# Patient Record
Sex: Male | Born: 1945 | Race: White | Hispanic: No | Marital: Married | Smoking: Never smoker
Health system: Southern US, Community
[De-identification: ages and names within clinical notes are randomized; demographics above are authoritative.]

## PROBLEM LIST (undated history)

## (undated) DIAGNOSIS — Z87442 Personal history of urinary calculi: Secondary | ICD-10-CM

## (undated) DIAGNOSIS — L03039 Cellulitis of unspecified toe: Secondary | ICD-10-CM

## (undated) DIAGNOSIS — I519 Heart disease, unspecified: Secondary | ICD-10-CM

## (undated) DIAGNOSIS — I10 Essential (primary) hypertension: Secondary | ICD-10-CM

## (undated) DIAGNOSIS — E119 Type 2 diabetes mellitus without complications: Secondary | ICD-10-CM

## (undated) DIAGNOSIS — L02619 Cutaneous abscess of unspecified foot: Secondary | ICD-10-CM

## (undated) HISTORY — PX: CORONARY ARTERY BYPASS GRAFT: SHX141

---

## 2011-11-01 DIAGNOSIS — E782 Mixed hyperlipidemia: Secondary | ICD-10-CM | POA: Diagnosis not present

## 2011-11-01 DIAGNOSIS — E669 Obesity, unspecified: Secondary | ICD-10-CM | POA: Diagnosis not present

## 2011-11-01 DIAGNOSIS — N400 Enlarged prostate without lower urinary tract symptoms: Secondary | ICD-10-CM | POA: Diagnosis not present

## 2011-11-01 DIAGNOSIS — E785 Hyperlipidemia, unspecified: Secondary | ICD-10-CM | POA: Diagnosis not present

## 2011-11-01 DIAGNOSIS — IMO0001 Reserved for inherently not codable concepts without codable children: Secondary | ICD-10-CM | POA: Diagnosis not present

## 2011-11-08 DIAGNOSIS — E782 Mixed hyperlipidemia: Secondary | ICD-10-CM | POA: Diagnosis not present

## 2011-11-08 DIAGNOSIS — R609 Edema, unspecified: Secondary | ICD-10-CM | POA: Diagnosis not present

## 2011-11-08 DIAGNOSIS — E669 Obesity, unspecified: Secondary | ICD-10-CM | POA: Diagnosis not present

## 2011-11-08 DIAGNOSIS — G4730 Sleep apnea, unspecified: Secondary | ICD-10-CM | POA: Diagnosis not present

## 2012-06-13 DIAGNOSIS — E669 Obesity, unspecified: Secondary | ICD-10-CM | POA: Diagnosis not present

## 2012-06-13 DIAGNOSIS — E782 Mixed hyperlipidemia: Secondary | ICD-10-CM | POA: Diagnosis not present

## 2012-06-20 DIAGNOSIS — G4730 Sleep apnea, unspecified: Secondary | ICD-10-CM | POA: Diagnosis not present

## 2012-06-20 DIAGNOSIS — E782 Mixed hyperlipidemia: Secondary | ICD-10-CM | POA: Diagnosis not present

## 2012-06-20 DIAGNOSIS — E119 Type 2 diabetes mellitus without complications: Secondary | ICD-10-CM | POA: Diagnosis not present

## 2012-06-20 DIAGNOSIS — E669 Obesity, unspecified: Secondary | ICD-10-CM | POA: Diagnosis not present

## 2012-06-20 DIAGNOSIS — R609 Edema, unspecified: Secondary | ICD-10-CM | POA: Diagnosis not present

## 2012-09-09 DIAGNOSIS — H524 Presbyopia: Secondary | ICD-10-CM | POA: Diagnosis not present

## 2012-09-09 DIAGNOSIS — H2510 Age-related nuclear cataract, unspecified eye: Secondary | ICD-10-CM | POA: Diagnosis not present

## 2012-09-09 DIAGNOSIS — E1139 Type 2 diabetes mellitus with other diabetic ophthalmic complication: Secondary | ICD-10-CM | POA: Diagnosis not present

## 2012-09-09 DIAGNOSIS — E11319 Type 2 diabetes mellitus with unspecified diabetic retinopathy without macular edema: Secondary | ICD-10-CM | POA: Diagnosis not present

## 2012-09-16 DIAGNOSIS — E669 Obesity, unspecified: Secondary | ICD-10-CM | POA: Diagnosis not present

## 2012-09-16 DIAGNOSIS — E7800 Pure hypercholesterolemia, unspecified: Secondary | ICD-10-CM | POA: Diagnosis not present

## 2012-09-20 DIAGNOSIS — Z23 Encounter for immunization: Secondary | ICD-10-CM | POA: Diagnosis not present

## 2013-03-14 DIAGNOSIS — Z23 Encounter for immunization: Secondary | ICD-10-CM | POA: Diagnosis not present

## 2013-03-19 DIAGNOSIS — E782 Mixed hyperlipidemia: Secondary | ICD-10-CM | POA: Diagnosis not present

## 2013-03-19 DIAGNOSIS — E669 Obesity, unspecified: Secondary | ICD-10-CM | POA: Diagnosis not present

## 2013-06-20 DIAGNOSIS — R609 Edema, unspecified: Secondary | ICD-10-CM | POA: Diagnosis not present

## 2013-09-22 DIAGNOSIS — E669 Obesity, unspecified: Secondary | ICD-10-CM | POA: Diagnosis not present

## 2013-09-22 DIAGNOSIS — E782 Mixed hyperlipidemia: Secondary | ICD-10-CM | POA: Diagnosis not present

## 2013-09-26 DIAGNOSIS — R609 Edema, unspecified: Secondary | ICD-10-CM | POA: Diagnosis not present

## 2014-05-05 DIAGNOSIS — E782 Mixed hyperlipidemia: Secondary | ICD-10-CM | POA: Diagnosis not present

## 2014-05-05 DIAGNOSIS — N320 Bladder-neck obstruction: Secondary | ICD-10-CM | POA: Diagnosis not present

## 2014-05-05 DIAGNOSIS — R609 Edema, unspecified: Secondary | ICD-10-CM | POA: Diagnosis not present

## 2014-05-05 DIAGNOSIS — E669 Obesity, unspecified: Secondary | ICD-10-CM | POA: Diagnosis not present

## 2014-05-05 DIAGNOSIS — IMO0001 Reserved for inherently not codable concepts without codable children: Secondary | ICD-10-CM | POA: Diagnosis not present

## 2014-05-12 DIAGNOSIS — IMO0001 Reserved for inherently not codable concepts without codable children: Secondary | ICD-10-CM | POA: Diagnosis not present

## 2014-11-02 DIAGNOSIS — E669 Obesity, unspecified: Secondary | ICD-10-CM | POA: Diagnosis not present

## 2014-11-02 DIAGNOSIS — E782 Mixed hyperlipidemia: Secondary | ICD-10-CM | POA: Diagnosis not present

## 2014-11-02 DIAGNOSIS — E1165 Type 2 diabetes mellitus with hyperglycemia: Secondary | ICD-10-CM | POA: Diagnosis not present

## 2014-11-02 DIAGNOSIS — D519 Vitamin B12 deficiency anemia, unspecified: Secondary | ICD-10-CM | POA: Diagnosis not present

## 2014-11-09 DIAGNOSIS — Z23 Encounter for immunization: Secondary | ICD-10-CM | POA: Diagnosis not present

## 2014-11-09 DIAGNOSIS — E1165 Type 2 diabetes mellitus with hyperglycemia: Secondary | ICD-10-CM | POA: Diagnosis not present

## 2014-11-09 DIAGNOSIS — E782 Mixed hyperlipidemia: Secondary | ICD-10-CM | POA: Diagnosis not present

## 2014-11-09 DIAGNOSIS — E669 Obesity, unspecified: Secondary | ICD-10-CM | POA: Diagnosis not present

## 2015-04-23 DIAGNOSIS — E782 Mixed hyperlipidemia: Secondary | ICD-10-CM | POA: Diagnosis not present

## 2015-04-23 DIAGNOSIS — E669 Obesity, unspecified: Secondary | ICD-10-CM | POA: Diagnosis not present

## 2015-04-23 DIAGNOSIS — E1165 Type 2 diabetes mellitus with hyperglycemia: Secondary | ICD-10-CM | POA: Diagnosis not present

## 2015-05-05 DIAGNOSIS — E669 Obesity, unspecified: Secondary | ICD-10-CM | POA: Diagnosis not present

## 2015-05-05 DIAGNOSIS — E782 Mixed hyperlipidemia: Secondary | ICD-10-CM | POA: Diagnosis not present

## 2015-05-05 DIAGNOSIS — E1165 Type 2 diabetes mellitus with hyperglycemia: Secondary | ICD-10-CM | POA: Diagnosis not present

## 2015-08-05 DIAGNOSIS — Z23 Encounter for immunization: Secondary | ICD-10-CM | POA: Diagnosis not present

## 2015-09-15 DIAGNOSIS — B351 Tinea unguium: Secondary | ICD-10-CM | POA: Diagnosis not present

## 2015-09-15 DIAGNOSIS — M79671 Pain in right foot: Secondary | ICD-10-CM | POA: Diagnosis not present

## 2015-12-29 DIAGNOSIS — L02619 Cutaneous abscess of unspecified foot: Secondary | ICD-10-CM

## 2015-12-29 DIAGNOSIS — L03039 Cellulitis of unspecified toe: Secondary | ICD-10-CM

## 2015-12-29 HISTORY — DX: Cutaneous abscess of unspecified foot: L02.619

## 2015-12-29 HISTORY — DX: Cutaneous abscess of unspecified foot: L03.039

## 2016-01-29 ENCOUNTER — Inpatient Hospital Stay (HOSPITAL_COMMUNITY)
Admission: EM | Admit: 2016-01-29 | Discharge: 2016-02-03 | DRG: 239 | Disposition: A | Discharge status: Home Health | Payer: Medicare Other | Attending: Internal Medicine | Admitting: Internal Medicine

## 2016-01-29 ENCOUNTER — Inpatient Hospital Stay (HOSPITAL_COMMUNITY): Payer: Medicare Other

## 2016-01-29 ENCOUNTER — Encounter (HOSPITAL_COMMUNITY): Payer: Self-pay | Admitting: Nurse Practitioner

## 2016-01-29 DIAGNOSIS — E119 Type 2 diabetes mellitus without complications: Secondary | ICD-10-CM | POA: Diagnosis not present

## 2016-01-29 DIAGNOSIS — M726 Necrotizing fasciitis: Secondary | ICD-10-CM | POA: Diagnosis present

## 2016-01-29 DIAGNOSIS — E785 Hyperlipidemia, unspecified: Secondary | ICD-10-CM | POA: Diagnosis present

## 2016-01-29 DIAGNOSIS — M7989 Other specified soft tissue disorders: Secondary | ICD-10-CM | POA: Diagnosis present

## 2016-01-29 DIAGNOSIS — M86171 Other acute osteomyelitis, right ankle and foot: Secondary | ICD-10-CM | POA: Diagnosis not present

## 2016-01-29 DIAGNOSIS — K219 Gastro-esophageal reflux disease without esophagitis: Secondary | ICD-10-CM | POA: Diagnosis present

## 2016-01-29 DIAGNOSIS — M869 Osteomyelitis, unspecified: Secondary | ICD-10-CM | POA: Diagnosis present

## 2016-01-29 DIAGNOSIS — I10 Essential (primary) hypertension: Secondary | ICD-10-CM | POA: Diagnosis present

## 2016-01-29 DIAGNOSIS — D509 Iron deficiency anemia, unspecified: Secondary | ICD-10-CM | POA: Diagnosis present

## 2016-01-29 DIAGNOSIS — G8918 Other acute postprocedural pain: Secondary | ICD-10-CM | POA: Diagnosis not present

## 2016-01-29 DIAGNOSIS — Z7984 Long term (current) use of oral hypoglycemic drugs: Secondary | ICD-10-CM | POA: Diagnosis not present

## 2016-01-29 DIAGNOSIS — Z7982 Long term (current) use of aspirin: Secondary | ICD-10-CM | POA: Diagnosis not present

## 2016-01-29 DIAGNOSIS — E1165 Type 2 diabetes mellitus with hyperglycemia: Secondary | ICD-10-CM | POA: Diagnosis present

## 2016-01-29 DIAGNOSIS — Z951 Presence of aortocoronary bypass graft: Secondary | ICD-10-CM

## 2016-01-29 DIAGNOSIS — E1169 Type 2 diabetes mellitus with other specified complication: Secondary | ICD-10-CM | POA: Diagnosis present

## 2016-01-29 DIAGNOSIS — I2510 Atherosclerotic heart disease of native coronary artery without angina pectoris: Secondary | ICD-10-CM | POA: Diagnosis present

## 2016-01-29 DIAGNOSIS — E1152 Type 2 diabetes mellitus with diabetic peripheral angiopathy with gangrene: Secondary | ICD-10-CM | POA: Diagnosis not present

## 2016-01-29 DIAGNOSIS — K5900 Constipation, unspecified: Secondary | ICD-10-CM | POA: Diagnosis present

## 2016-01-29 DIAGNOSIS — I96 Gangrene, not elsewhere classified: Secondary | ICD-10-CM | POA: Diagnosis not present

## 2016-01-29 DIAGNOSIS — Z792 Long term (current) use of antibiotics: Secondary | ICD-10-CM

## 2016-01-29 DIAGNOSIS — L089 Local infection of the skin and subcutaneous tissue, unspecified: Secondary | ICD-10-CM | POA: Diagnosis not present

## 2016-01-29 DIAGNOSIS — L03119 Cellulitis of unspecified part of limb: Secondary | ICD-10-CM | POA: Diagnosis not present

## 2016-01-29 DIAGNOSIS — L02619 Cutaneous abscess of unspecified foot: Secondary | ICD-10-CM | POA: Diagnosis present

## 2016-01-29 DIAGNOSIS — M6701 Short Achilles tendon (acquired), right ankle: Secondary | ICD-10-CM | POA: Diagnosis not present

## 2016-01-29 HISTORY — DX: Heart disease, unspecified: I51.9

## 2016-01-29 HISTORY — DX: Cellulitis of unspecified toe: L03.039

## 2016-01-29 HISTORY — DX: Personal history of urinary calculi: Z87.442

## 2016-01-29 HISTORY — DX: Type 2 diabetes mellitus without complications: E11.9

## 2016-01-29 HISTORY — DX: Essential (primary) hypertension: I10

## 2016-01-29 HISTORY — DX: Cutaneous abscess of unspecified foot: L02.619

## 2016-01-29 LAB — COMPREHENSIVE METABOLIC PANEL WITH GFR
ALT: 31 U/L (ref 17–63)
AST: 31 U/L (ref 15–41)
Albumin: 2.7 g/dL — ABNORMAL LOW (ref 3.5–5.0)
Alkaline Phosphatase: 83 U/L (ref 38–126)
Anion gap: 13 (ref 5–15)
BUN: 19 mg/dL (ref 6–20)
CO2: 21 mmol/L — ABNORMAL LOW (ref 22–32)
Calcium: 9.6 mg/dL (ref 8.9–10.3)
Chloride: 99 mmol/L — ABNORMAL LOW (ref 101–111)
Creatinine, Ser: 1.1 mg/dL (ref 0.61–1.24)
GFR calc Af Amer: >60 mL/min (ref >60)
GFR calc non Af Amer: >60 mL/min (ref >60)
Glucose, Bld: 364 mg/dL — ABNORMAL HIGH (ref 65–99)
Potassium: 4.2 mmol/L (ref 3.5–5.1)
Sodium: 133 mmol/L — ABNORMAL LOW (ref 135–145)
Total Bilirubin: 0.4 mg/dL (ref 0.3–1.2)
Total Protein: 7 g/dL (ref 6.5–8.1)

## 2016-01-29 LAB — CBC WITH DIFFERENTIAL/PLATELET
BASOS PCT: 0 %
Basophils Absolute: 0 10*3/uL (ref 0.0–0.1)
Eosinophils Absolute: 0.2 10*3/uL (ref 0.0–0.7)
Eosinophils Relative: 2 %
HCT: 29.3 % — ABNORMAL LOW (ref 39.0–52.0)
HEMOGLOBIN: 9.2 g/dL — AB (ref 13.0–17.0)
Lymphocytes Relative: 12 %
Lymphs Abs: 1.3 10*3/uL (ref 0.7–4.0)
MCH: 22.8 pg — ABNORMAL LOW (ref 26.0–34.0)
MCHC: 31.4 g/dL (ref 30.0–36.0)
MCV: 72.7 fL — ABNORMAL LOW (ref 78.0–100.0)
MONOS PCT: 8 %
Monocytes Absolute: 0.9 10*3/uL (ref 0.1–1.0)
NEUTROS ABS: 8.7 10*3/uL — AB (ref 1.7–7.7)
NEUTROS PCT: 78 %
Platelets: 454 10*3/uL — ABNORMAL HIGH (ref 150–400)
RBC: 4.03 MIL/uL — ABNORMAL LOW (ref 4.22–5.81)
RDW: 17.1 % — ABNORMAL HIGH (ref 11.5–15.5)
WBC: 11.1 10*3/uL — ABNORMAL HIGH (ref 4.0–10.5)

## 2016-01-29 LAB — I-STAT CG4 LACTIC ACID, ED
LACTIC ACID, VENOUS: 1.2 mmol/L (ref 0.5–2.0)
Lactic Acid, Venous: 1.55 mmol/L (ref 0.5–2.0)

## 2016-01-29 LAB — CBG MONITORING, ED: Glucose-Capillary: 370 mg/dL — ABNORMAL HIGH (ref 65–99)

## 2016-01-29 MED ORDER — SODIUM CHLORIDE 0.9 % IV BOLUS (SEPSIS)
1000.0000 mL | Freq: Once | INTRAVENOUS | Status: AC
  Administered 2016-01-29: 1000 mL via INTRAVENOUS

## 2016-01-29 MED ORDER — VANCOMYCIN HCL IN DEXTROSE 1-5 GM/200ML-% IV SOLN: 1000.0000 mg | Freq: Once | INTRAVENOUS | Status: DC

## 2016-01-29 MED ORDER — VANCOMYCIN HCL 10 G IV SOLR
1250.0000 mg | Freq: Two times a day (BID) | INTRAVENOUS | Status: DC
  Administered 2016-01-30 – 2016-02-01 (×6): 1250 mg via INTRAVENOUS
  Filled 2016-01-29 (×7): qty 1250

## 2016-01-29 MED ORDER — VANCOMYCIN HCL 10 G IV SOLR
2000.0000 mg | Freq: Once | INTRAVENOUS | Status: AC
  Administered 2016-01-29: 2000 mg via INTRAVENOUS
  Filled 2016-01-29: qty 2000

## 2016-01-29 MED ORDER — PIPERACILLIN-TAZOBACTAM 3.375 G IVPB
3.3750 g | Freq: Three times a day (TID) | INTRAVENOUS | Status: DC
  Administered 2016-01-30 – 2016-02-02 (×9): 3.375 g via INTRAVENOUS
  Filled 2016-01-29 (×11): qty 50

## 2016-01-29 MED ORDER — PIPERACILLIN-TAZOBACTAM 3.375 G IVPB 30 MIN
3.3750 g | Freq: Once | INTRAVENOUS | Status: AC
  Administered 2016-01-29: 3.375 g via INTRAVENOUS
  Filled 2016-01-29: qty 50

## 2016-01-29 MED ORDER — INSULIN ASPART 100 UNIT/ML ~~LOC~~ SOLN
8.0000 [IU] | Freq: Once | SUBCUTANEOUS | Status: AC
  Administered 2016-01-29: 8 [IU] via SUBCUTANEOUS
  Filled 2016-01-29: qty 1

## 2016-01-29 NOTE — H&P (Signed)
[facility] Admission History and Physical       [name] AVW:[id number] DOB: 1945/11/03 DOA: 01/29/2016  Referring physician: EDP PCP: Theadora RamaHIGH,KEVIN P., MD  Specialists:   Chief Complaint:  Worsening Wound  HPI: Todd LootsFoye Celestin is a 70 y.o. male with a history of DM2, HTN who presents to the ED with complaints of worsening pain and redness and swelling of his Right Foot with blackening of his Right great toe over the past 3 weeks despite 2 different antibiotics, Keflex then Clindamycin.    He reports that 3 weeks ago he hit his toe on the corner of his bed and this pulled the toenail off.   The wound continued to worsen.   He was on a cruise to [place] for the past 5 days and he was taken off the ship because he needed further medical evaluation.   In the ED, he was evlautated and placed on IV Vancomycin and Zosyn, and Orthopedics was consulted.   He denies any fevers or chills.      Review of Systems:  Constitutional: No Weight Loss, No Weight Gain, Night Sweats, Fevers, Chills, Dizziness, Light Headedness, Fatigue, or Generalized Weakness HEENT: No Headaches, Difficulty Swallowing,Tooth/Dental Problems,Sore Throat,  No Sneezing, Rhinitis, Ear Ache, Nasal Congestion, or Post Nasal Drip,  Cardio-vascular:  No Chest pain, Orthopnea, PND, Edema in Lower Extremities, Anasarca, Dizziness, Palpitations  Resp: No Dyspnea, No DOE, No Productive Cough, No Non-Productive Cough, No Hemoptysis, No Wheezing.    GI: No Heartburn, Indigestion, Abdominal Pain, Nausea, Vomiting, Diarrhea, Constipation, Hematemesis, Hematochezia, Melena, Change in Bowel Habits,  Loss of Appetite  GU: No Dysuria, No Change in Color of Urine, No Urgency or Urinary Frequency, No Flank pain.  Musculoskeletal: No Joint Pain or Swelling, No Decreased Range of Motion, No Back Pain.  Neurologic: No Syncope, No Seizures, Muscle Weakness, Paresthesia, Vision Disturbance or Loss, No Diplopia, No Vertigo, No Difficulty  Walking,  Skin: + Wound No Rash or Lesions. Psych: No Change in Mood or Affect, No Depression or Anxiety, No Memory loss, No Confusion, or Hallucinations   Past Medical History  Diagnosis Date  . Diabetes mellitus without complication (HCC)   . Heart problem   . Hypertension      Past Surgical History  Procedure Laterality Date  . Coronary artery bypass graft        Prior to Admission medications   Medication Sig Start Date End Date Taking? Authorizing Provider  aspirin EC 81 MG tablet Take 81 mg by mouth daily.   Yes Historical Provider, MD  clindamycin (CLEOCIN) 300 MG capsule Take 300 mg by mouth 4 (four) times daily.   Yes Historical Provider, MD  diphenhydrAMINE (BENADRYL) 25 MG tablet Take 25 mg by mouth every 6 (six) hours as needed for itching.   Yes Historical Provider, MD  furosemide (LASIX) 40 MG tablet Take 40 mg by mouth daily.   Yes Historical Provider, MD  glipiZIDE (GLUCOTROL XL) 10 MG 24 hr tablet Take 10 mg by mouth 2 (two) times daily.   Yes Historical Provider, MD  lisinopril (PRINIVIL,ZESTRIL) 10 MG tablet Take 10 mg by mouth daily.   Yes Historical Provider, MD  loratadine (CLARITIN) 10 MG tablet Take 10 mg by mouth daily.   Yes Historical Provider, MD  metFORMIN (GLUCOPHAGE) 1000 MG tablet Take 1,000 mg by mouth 2 (two) times daily with a meal.   Yes Historical Provider, MD  metoprolol (LOPRESSOR) 50 MG tablet Take 50 mg by mouth 3 (three) times daily.  Yes Historical Provider, MD  Multiple Vitamin (MULTIVITAMIN WITH MINERALS) TABS tablet Take 1 tablet by mouth daily.   Yes Historical Provider, MD  naproxen sodium (ANAPROX) 220 MG tablet Take 220 mg by mouth 2 (two) times daily as needed (for pain).   Yes Historical Provider, MD  omeprazole (PRILOSEC OTC) 20 MG tablet Take 20 mg by mouth daily.   Yes Historical Provider, MD  Phenylephrine-DM-GG-APAP (TYLENOL COLD MULTI-SYMPTOM) 5-10-200-325 MG TABS Take 2 tablets by mouth daily as needed (for cold).   Yes  Historical Provider, MD  simvastatin (ZOCOR) 80 MG tablet Take 80 mg by mouth daily at 6 PM.   Yes Historical Provider, MD  cephALEXin (KEFLEX) 500 MG capsule Take 500 mg by mouth 3 (three) times daily. Reported on 01/29/2016 01/22/16   Historical Provider, MD     No Known Allergies     Social History:  reports that he has never smoked. He does not have any smokeless tobacco history on file. He reports that he drinks alcohol. He reports that he does not use illicit drugs.     History reviewed. No pertinent family history.     Physical Exam:  GEN:  Pleasant Obese Elderly 70 y.o. Caucasian male examined and in no acute distress; cooperative with exam Filed Vitals:   01/29/16 1810 01/29/16 2000  BP: 142/87   Pulse: 110   Temp: 99.3 F (37.4 C)   TempSrc: Oral   Resp: 17   Weight:  97.523 kg (215 lb)  SpO2: 98%    Blood pressure 142/87, pulse 110, temperature 99.3 F (37.4 C), temperature source Oral, resp. rate 17, weight 97.523 kg (215 lb), SpO2 98 %. PSYCH: He is alert and oriented x4; does not appear anxious does not appear depressed; affect is normal HEENT: Normocephalic and Atraumatic, Mucous membranes pink; PERRLA; EOM intact; Fundi:  Benign;  No scleral icterus, Nares: Patent, Oropharynx: Clear, Fair Dentition,    Neck:  FROM, No Cervical Lymphadenopathy nor Thyromegaly or Carotid Bruit; No JVD; Breasts:: Not examined CHEST WALL: No tenderness CHEST: Normal respiration, clear to auscultation bilaterally HEART: Regular rate and rhythm; no murmurs rubs or gallops BACK: No kyphosis or scoliosis; No CVA tenderness ABDOMEN: Positive Bowel Sounds,  Obese, Soft Non-Tender, No Rebound or Guarding; No Masses, No Organomegaly. Rectal Exam: Not done EXTREMITIES: + Necrotic Right Great Toe, +Erythema and Edema of Right Foot; Otherwise No Cyanosis, Clubbing, or Edema; No Ulcerations. Genitalia: not examined PULSES: 2+ and symmetric SKIN: Normal hydration no rash or ulceration CNS:   Alert and Oriented x 4, No Focal Deficits Vascular: pulses palpable throughout    Labs on Admission:  Basic Metabolic Panel:  Recent Labs Lab 01/29/16 1821  NA 133*  K 4.2  CL 99*  CO2 21*  GLUCOSE 364*  BUN 19  CREATININE 1.10  CALCIUM 9.6   Liver Function Tests:  Recent Labs Lab 01/29/16 1821  AST 31  ALT 31  ALKPHOS 83  BILITOT 0.4  PROT 7.0  ALBUMIN 2.7*   No results for input(s): LIPASE, AMYLASE in the last 168 hours. No results for input(s): AMMONIA in the last 168 hours. CBC:  Recent Labs Lab 01/29/16 1821  WBC 11.1*  NEUTROABS 8.7*  HGB 9.2*  HCT 29.3*  MCV 72.7*  PLT 454*   Cardiac Enzymes: No results for input(s): CKTOTAL, CKMB, CKMBINDEX, TROPONINI in the last 168 hours.  BNP (last 3 results) No results for input(s): BNP in the last 8760 hours.  ProBNP (last 3 results) No  results for input(s): PROBNP in the last 8760 hours.  CBG:  Recent Labs Lab 01/29/16 1818  GLUCAP 370*    Radiological Exams on Admission: No results found.   EKG: Independently reviewed.     Assessment/Plan:      70 y.o. male with  Active Problems:    Cellulitis and abscess of foot/Osteomyelitis (HCC)/ Foot infection    IV Vancomycin and IV Zosyn          Diabetes mellitus without complication (HCC)    SSI coverage PRN    Check HbA1C        Hypertension    Monitor BPs    Consider Lasix, Metoprolol          Anemia    Anemia Panel    DVT Prophylaxis    SCDs      Code Status:     FULL CODE        Family Communication:   No Family Present    Disposition Plan:    Inpatient Status        Time spent:  9 Minutes      Ron Parker [facility] Pager [phone]   If 7AM -7PM Please Contact the Day Rounding Team MD for [facility]  If 7PM-7AM, Please Contact Night-Floor Coverage  [email or web address] Password [email or web address] 01/29/2016, 10:35 PM     ADDENDUM:   Patient was seen and examined on 01/29/2016

## 2016-01-29 NOTE — ED Notes (Signed)
Attempted report x1. 

## 2016-01-29 NOTE — ED Provider Notes (Signed)
CSN: [id number]     Arrival date & time 01/29/16  1801 History   First MD Initiated Contact with Patient 01/29/16 1911     Chief Complaint  Patient presents with  . Nail Problem   HPI   70 year old male presents today with infection of his right great toe and foot. Patient reports that 3 weeks ago he stubbed his right great toe causing bruising and him to lose his nail. He notes that he went and saw primary care who placed him on Keflex which seemed to all the very minimal redness E had on his great toe. He notes that he went on a cruise ship, with increased redness and swelling to the toe, was seen on March 25 started on clindamycin 300 mg 4 times daily. Patient reports that he took the clindamycin yesterday as directed, and all 4 ptosis today. Patient notes the redness, swelling has continued to progress with blackening of his toe spreading redness to the distal aspect of the foot. Patient is a diabetic, was attempting to manage his blood sugar on metformin alone, he has refused insulin therapy prior to evaluation. Patient denies any fever, chills, nausea, vomiting.  Past Medical History  Diagnosis Date  . Diabetes mellitus without complication (HCC)   . Heart problem   . Hypertension    Past Surgical History  Procedure Laterality Date  . Coronary artery bypass graft     History reviewed. No pertinent family history. Social History  Substance Use Topics  . Smoking status: Never Smoker   . Smokeless tobacco: None  . Alcohol Use: Yes    Review of Systems  All other systems reviewed and are negative.   Allergies  Review of patient's allergies indicates no known allergies.  Home Medications   Prior to Admission medications   Medication Sig Start Date End Date Taking? Authorizing Provider  aspirin EC 81 MG tablet Take 81 mg by mouth daily.   Yes Historical Provider, MD  clindamycin (CLEOCIN) 300 MG capsule Take 300 mg by mouth 4 (four) times daily.   Yes Historical Provider, MD    diphenhydrAMINE (BENADRYL) 25 MG tablet Take 25 mg by mouth every 6 (six) hours as needed for itching.   Yes Historical Provider, MD  furosemide (LASIX) 40 MG tablet Take 40 mg by mouth daily.   Yes Historical Provider, MD  glipiZIDE (GLUCOTROL XL) 10 MG 24 hr tablet Take 10 mg by mouth 2 (two) times daily.   Yes Historical Provider, MD  lisinopril (PRINIVIL,ZESTRIL) 10 MG tablet Take 10 mg by mouth daily.   Yes Historical Provider, MD  loratadine (CLARITIN) 10 MG tablet Take 10 mg by mouth daily.   Yes Historical Provider, MD  metFORMIN (GLUCOPHAGE) 1000 MG tablet Take 1,000 mg by mouth 2 (two) times daily with a meal.   Yes Historical Provider, MD  metoprolol (LOPRESSOR) 50 MG tablet Take 50 mg by mouth 3 (three) times daily.   Yes Historical Provider, MD  Multiple Vitamin (MULTIVITAMIN WITH MINERALS) TABS tablet Take 1 tablet by mouth daily.   Yes Historical Provider, MD  naproxen sodium (ANAPROX) 220 MG tablet Take 220 mg by mouth 2 (two) times daily as needed (for pain).   Yes Historical Provider, MD  omeprazole (PRILOSEC OTC) 20 MG tablet Take 20 mg by mouth daily.   Yes Historical Provider, MD  Phenylephrine-DM-GG-APAP (TYLENOL COLD MULTI-SYMPTOM) 5-10-200-325 MG TABS Take 2 tablets by mouth daily as needed (for cold).   Yes Historical Provider, MD  simvastatin (ZOCOR)  80 MG tablet Take 80 mg by mouth daily at 6 PM.   Yes Historical Provider, MD  cephALEXin (KEFLEX) 500 MG capsule Take 500 mg by mouth 3 (three) times daily. Reported on 01/29/2016 01/22/16   Historical Provider, MD   BP 142/87 mmHg  Pulse 110  Temp(Src) 99.3 F (37.4 C) (Oral)  Resp 17  Wt 97.523 kg  SpO2 98%    Physical Exam  Constitutional: He is oriented to person, place, and time. He appears well-developed and well-nourished.  HENT:  Head: Normocephalic and atraumatic.  Eyes: Conjunctivae are normal. Pupils are equal, round, and reactive to light. Right eye exhibits no discharge. Left eye exhibits no discharge. No  scleral icterus.  Neck: Normal range of motion. No JVD present. No tracheal deviation present.  Pulmonary/Chest: Effort normal. No stridor.  Neurological: He is alert and oriented to person, place, and time. Coordination normal.  Psychiatric: He has a normal mood and affect. His behavior is normal. Judgment and thought content normal.  Nursing note and vitals reviewed.  Pedal pulses intact bilateral      ED Course  Procedures (including critical care time) Labs Review Labs Reviewed  COMPREHENSIVE METABOLIC PANEL - Abnormal; Notable for the following:    Sodium 133 (*)    Chloride 99 (*)    CO2 21 (*)    Glucose, Bld 364 (*)    Albumin 2.7 (*)    All other components within normal limits  CBC WITH DIFFERENTIAL/PLATELET - Abnormal; Notable for the following:    WBC 11.1 (*)    RBC 4.03 (*)    Hemoglobin 9.2 (*)    HCT 29.3 (*)    MCV 72.7 (*)    MCH 22.8 (*)    RDW 17.1 (*)    Platelets 454 (*)    Neutro Abs 8.7 (*)    All other components within normal limits  CBG MONITORING, ED - Abnormal; Notable for the following:    Glucose-Capillary 370 (*)    All other components within normal limits  CULTURE, BLOOD (ROUTINE X 2)  CULTURE, BLOOD (ROUTINE X 2)  I-STAT CG4 LACTIC ACID, ED  I-STAT CG4 LACTIC ACID, ED    Imaging Review No results found. I have personally reviewed and evaluated these images and lab results as part of my medical decision-making.   EKG Interpretation None      MDM   Final diagnoses:  Foot infection    Labs:  I-STAT lactic acid, CMP, CBC, point of care CBG- WBC 11.1  Imaging:   Consults: Hospitalist service  Therapeutics:  Discharge Meds:   Assessment/Plan: 70 year old male presents today with infection to his toe and foot. Patient has significant necrotic tissue with deep space infection. Patient has a uncontrolled diabetic. Patient has had Keflex, currently taking clindamycin. Patient is afebrile nontoxic, he does have a small  amount of tachycardia, with no significant elevation and lactic acid. Patient will be placed on antibiotics, admitted to hospital service. Dr. Charlotte Sanes personally spoke with hospitalist service and orthopedics regarding this patient.        Eyvonne Mechanic, PA-C 01/30/16 0154  Courteney Randall An, MD 01/30/16 1558

## 2016-01-29 NOTE — ED Notes (Addendum)
Pt reports over past few days his R great toe has "become black" and his foot has been hurting. He has been on 2 different oral antibiotics over past days for infection of this toe after he injured the toe several weeks ago and the toenail came off. His right foot is red and swollen with dry scaly skin and darkened discoloration of R great toe. He also reports his blood sugar was over 500 when he checked it this morning.

## 2016-01-29 NOTE — ED Notes (Signed)
Patient transported to X-ray 

## 2016-01-29 NOTE — Progress Notes (Signed)
Pharmacy Antibiotic Note  [name] is a 70 y.o. male admitted on 01/29/2016 with cellulitis.  Pharmacy has been consulted for vancomycin and zosyn dosing. Pt is afebrile and WBC is 11.1. SCr is WNL at 1.1 and lactic is 1.55.   Plan: - Vancomycin 2gm IV x 1 then 1250mg  IV Q12H - Zosyn 3.375gm IV Q8H (4 hr inf) - F/u renal fxn, C&S, clinical status and trough at SS  Weight: 215 lb (97.523 kg)  Temp (24hrs), Avg:99.3 F (37.4 C), Min:99.3 F (37.4 C), Max:99.3 F (37.4 C)   Recent Labs Lab 01/29/16 1821 01/29/16 1831  WBC 11.1*  --   CREATININE 1.10  --   LATICACIDVEN  --  1.55    CrCl cannot be calculated (Unknown ideal weight.).    No Known Allergies  Antimicrobials this admission: Vanc 4/1>> Zosyn 4/1>>  Dose adjustments this admission: N/A  Microbiology results: Pending  Thank you for allowing pharmacy to be a part of this patient's care.  Neizan Debruhl, Drake LeachRachel Lynn 01/29/2016 8:58 PM

## 2016-01-30 LAB — IRON AND TIBC
IRON: 13 ug/dL — AB (ref 45–182)
SATURATION RATIOS: 5 % — AB (ref 17.9–39.5)
TIBC: 272 ug/dL (ref 250–450)
UIBC: 259 ug/dL

## 2016-01-30 LAB — GLUCOSE, CAPILLARY
GLUCOSE-CAPILLARY: 234 mg/dL — AB (ref 65–99)
GLUCOSE-CAPILLARY: 314 mg/dL — AB (ref 65–99)
GLUCOSE-CAPILLARY: 322 mg/dL — AB (ref 65–99)
Glucose-Capillary: 174 mg/dL — ABNORMAL HIGH (ref 65–99)
Glucose-Capillary: 307 mg/dL — ABNORMAL HIGH (ref 65–99)

## 2016-01-30 LAB — CBC
HCT: 25.2 % — ABNORMAL LOW (ref 39.0–52.0)
Hemoglobin: 8 g/dL — ABNORMAL LOW (ref 13.0–17.0)
MCH: 23.1 pg — ABNORMAL LOW (ref 26.0–34.0)
MCHC: 31.7 g/dL (ref 30.0–36.0)
MCV: 72.8 fL — ABNORMAL LOW (ref 78.0–100.0)
PLATELETS: 364 10*3/uL (ref 150–400)
RBC: 3.46 MIL/uL — ABNORMAL LOW (ref 4.22–5.81)
RDW: 17.1 % — AB (ref 11.5–15.5)
WBC: 7.6 10*3/uL (ref 4.0–10.5)

## 2016-01-30 LAB — BASIC METABOLIC PANEL
Anion gap: 8 (ref 5–15)
BUN: 15 mg/dL (ref 6–20)
CALCIUM: 8.8 mg/dL — AB (ref 8.9–10.3)
CO2: 24 mmol/L (ref 22–32)
CREATININE: 0.87 mg/dL (ref 0.61–1.24)
Chloride: 106 mmol/L (ref 101–111)
GFR calc Af Amer: >60 mL/min (ref >60)
Glucose, Bld: 205 mg/dL — ABNORMAL HIGH (ref 65–99)
Potassium: 3.8 mmol/L (ref 3.5–5.1)
SODIUM: 138 mmol/L (ref 135–145)

## 2016-01-30 LAB — PROTIME-INR
INR: 1.34 (ref 0.00–1.49)
Prothrombin Time: 16.7 seconds — ABNORMAL HIGH (ref 11.6–15.2)

## 2016-01-30 LAB — RETICULOCYTES
RBC.: 3.83 MIL/uL — ABNORMAL LOW (ref 4.22–5.81)
RETIC CT PCT: 1.6 % (ref 0.4–3.1)
Retic Count, Absolute: 61.3 10*3/uL (ref 19.0–186.0)

## 2016-01-30 LAB — APTT: APTT: 30 s (ref 24–37)

## 2016-01-30 LAB — VITAMIN B12: VITAMIN B 12: 425 pg/mL (ref 180–914)

## 2016-01-30 LAB — LACTIC ACID, PLASMA
LACTIC ACID, VENOUS: 0.6 mmol/L (ref 0.5–2.0)
Lactic Acid, Venous: 1.1 mmol/L (ref 0.5–2.0)

## 2016-01-30 LAB — FOLATE: Folate: 30.1 ng/mL (ref >5.9)

## 2016-01-30 LAB — FERRITIN: FERRITIN: 67 ng/mL (ref 24–336)

## 2016-01-30 LAB — PROCALCITONIN

## 2016-01-30 MED ORDER — FUROSEMIDE 40 MG PO TABS
40.0000 mg | ORAL_TABLET | Freq: Every day | ORAL | Status: DC
  Administered 2016-01-30 – 2016-02-03 (×5): 40 mg via ORAL
  Filled 2016-01-30 (×5): qty 1

## 2016-01-30 MED ORDER — SODIUM CHLORIDE 0.9 % IV SOLN
INTRAVENOUS | Status: DC
  Administered 2016-01-30: 05:00:00 via INTRAVENOUS

## 2016-01-30 MED ORDER — ACETAMINOPHEN 650 MG RE SUPP: 650.0000 mg | Freq: Four times a day (QID) | RECTAL | Status: DC | PRN

## 2016-01-30 MED ORDER — INSULIN ASPART 100 UNIT/ML ~~LOC~~ SOLN
0.0000 [IU] | SUBCUTANEOUS | Status: DC
  Administered 2016-01-30: 2 [IU] via SUBCUTANEOUS
  Administered 2016-01-30: 3 [IU] via SUBCUTANEOUS

## 2016-01-30 MED ORDER — ADULT MULTIVITAMIN W/MINERALS CH
1.0000 | ORAL_TABLET | Freq: Every day | ORAL | Status: DC
  Administered 2016-01-30 – 2016-02-03 (×5): 1 via ORAL
  Filled 2016-01-30 (×5): qty 1

## 2016-01-30 MED ORDER — ATORVASTATIN CALCIUM 40 MG PO TABS
40.0000 mg | ORAL_TABLET | Freq: Every day | ORAL | Status: DC
  Administered 2016-01-30 – 2016-02-02 (×4): 40 mg via ORAL
  Filled 2016-01-30 (×3): qty 1

## 2016-01-30 MED ORDER — INSULIN GLARGINE 100 UNIT/ML ~~LOC~~ SOLN
10.0000 [IU] | Freq: Every day | SUBCUTANEOUS | Status: DC
  Administered 2016-01-30: 10 [IU] via SUBCUTANEOUS
  Filled 2016-01-30 (×2): qty 0.1

## 2016-01-30 MED ORDER — HYDROMORPHONE HCL 1 MG/ML IJ SOLN
0.5000 mg | INTRAMUSCULAR | Status: DC | PRN
  Administered 2016-02-02 – 2016-02-03 (×6): 1 mg via INTRAVENOUS
  Filled 2016-01-30 (×6): qty 1

## 2016-01-30 MED ORDER — METOPROLOL TARTRATE 50 MG PO TABS
50.0000 mg | ORAL_TABLET | Freq: Three times a day (TID) | ORAL | Status: DC
  Administered 2016-01-30 – 2016-02-03 (×13): 50 mg via ORAL
  Filled 2016-01-30 (×13): qty 1

## 2016-01-30 MED ORDER — OXYCODONE HCL 5 MG PO TABS
5.0000 mg | ORAL_TABLET | ORAL | Status: DC | PRN
  Administered 2016-02-02: 5 mg via ORAL
  Filled 2016-01-30: qty 1

## 2016-01-30 MED ORDER — LORATADINE 10 MG PO TABS
10.0000 mg | ORAL_TABLET | Freq: Every day | ORAL | Status: DC
  Administered 2016-01-31 – 2016-02-03 (×4): 10 mg via ORAL
  Filled 2016-01-30 (×5): qty 1

## 2016-01-30 MED ORDER — ACETAMINOPHEN 325 MG PO TABS
650.0000 mg | ORAL_TABLET | Freq: Four times a day (QID) | ORAL | Status: DC | PRN
  Filled 2016-01-30: qty 2

## 2016-01-30 MED ORDER — ONDANSETRON HCL 4 MG PO TABS: 4.0000 mg | ORAL_TABLET | Freq: Four times a day (QID) | ORAL | Status: DC | PRN

## 2016-01-30 MED ORDER — ONDANSETRON HCL 4 MG/2ML IJ SOLN: 4.0000 mg | Freq: Four times a day (QID) | INTRAMUSCULAR | Status: DC | PRN

## 2016-01-30 MED ORDER — INSULIN ASPART 100 UNIT/ML ~~LOC~~ SOLN
0.0000 [IU] | Freq: Every day | SUBCUTANEOUS | Status: DC
Start: 2016-01-30 — End: 2016-02-03
  Administered 2016-01-30: 4 [IU] via SUBCUTANEOUS
  Administered 2016-01-31: 5 [IU] via SUBCUTANEOUS
  Administered 2016-02-01 – 2016-02-02 (×2): 2 [IU] via SUBCUTANEOUS

## 2016-01-30 MED ORDER — INSULIN ASPART 100 UNIT/ML ~~LOC~~ SOLN
0.0000 [IU] | Freq: Three times a day (TID) | SUBCUTANEOUS | Status: DC
  Administered 2016-01-30 – 2016-01-31 (×4): 11 [IU] via SUBCUTANEOUS
  Administered 2016-01-31: 8 [IU] via SUBCUTANEOUS
  Administered 2016-02-01 – 2016-02-02 (×4): 5 [IU] via SUBCUTANEOUS
  Administered 2016-02-02: 3 [IU] via SUBCUTANEOUS
  Administered 2016-02-02 – 2016-02-03 (×3): 5 [IU] via SUBCUTANEOUS

## 2016-01-30 MED ORDER — PANTOPRAZOLE SODIUM 40 MG PO TBEC
40.0000 mg | DELAYED_RELEASE_TABLET | Freq: Every day | ORAL | Status: DC
  Administered 2016-01-30 – 2016-02-03 (×5): 40 mg via ORAL
  Filled 2016-01-30 (×5): qty 1

## 2016-01-30 MED ORDER — HEPARIN SODIUM (PORCINE) 5000 UNIT/ML IJ SOLN
5000.0000 [IU] | Freq: Three times a day (TID) | INTRAMUSCULAR | Status: DC
  Administered 2016-01-30 – 2016-01-31 (×3): 5000 [IU] via SUBCUTANEOUS
  Filled 2016-01-30 (×3): qty 1

## 2016-01-30 NOTE — Progress Notes (Signed)
Patient seen and evaluated.  R foot gangrene and cellulitis Cont IV abx Dr. Victorino DikeHewitt plans for surgery Tuesday

## 2016-01-30 NOTE — Consult Note (Signed)
Reason for Consult:  Right foot gangrene Referring Physician:  Dr. Murvin Donning is an 70 y.o. male.  HPI:  70 y/o male with PMH of CAD and poorly controlled DM presents to the ER with a cc of right forefoot redness and swelling.  He describes an injury to the right hallux several weeks ago when he kicked a piece of furniture.  He says the toe darkened in color a few days ago while he was on a cruise to [place].  The ship's doctor instructed him to disembark to seek care for the worsening infection of the right hallux.  He denies previous amputations or any injury or surgery to either foot.  He is not a smoker.  He denies recent f/c/n/v/wt loss.  He denies any significant pain in the right forefoot.  Past Medical History  Diagnosis Date  . Diabetes mellitus without complication ([place])   . Heart problem   . Hypertension     Past Surgical History  Procedure Laterality Date  . Coronary artery bypass graft      FH:  Liver disease in father.    Social History:  reports that he has never smoked. He does not have any smokeless tobacco history on file. He reports that he drinks alcohol. He reports that he does not use illicit drugs.  Allergies: No Known Allergies  Medications: I have reviewed the patient's current medications.  On vanc and zosyn.  Results for orders placed or performed during the hospital encounter of 01/29/16 (from the past 48 hour(s))  POC CBG, ED     Status: Abnormal   Collection Time: 01/29/16  6:18 PM  Result Value Ref Range   Glucose-Capillary 370 (H) 65 - 99 mg/dL  Comprehensive metabolic panel     Status: Abnormal   Collection Time: 01/29/16  6:21 PM  Result Value Ref Range   Sodium 133 (L) 135 - 145 mmol/L   Potassium 4.2 3.5 - 5.1 mmol/L   Chloride 99 (L) 101 - 111 mmol/L   CO2 21 (L) 22 - 32 mmol/L   Glucose, Bld 364 (H) 65 - 99 mg/dL   BUN 19 6 - 20 mg/dL   Creatinine, Ser 1.10 0.61 - 1.24 mg/dL   Calcium 9.6 8.9 - 10.3 mg/dL   Total Protein  7.0 6.5 - 8.1 g/dL   Albumin 2.7 (L) 3.5 - 5.0 g/dL   AST 31 15 - 41 U/L   ALT 31 17 - 63 U/L   Alkaline Phosphatase 83 38 - 126 U/L   Total Bilirubin 0.4 0.3 - 1.2 mg/dL   GFR calc non Af Amer >60 >60 mL/min   GFR calc Af Amer >60 >60 mL/min    Comment: (NOTE) The eGFR has been calculated using the CKD EPI equation. This calculation has not been validated in all clinical situations. eGFR's persistently <60 mL/min signify possible Chronic Kidney Disease.    Anion gap 13 5 - 15  CBC with Differential     Status: Abnormal   Collection Time: 01/29/16  6:21 PM  Result Value Ref Range   WBC 11.1 (H) 4.0 - 10.5 K/uL   RBC 4.03 (L) 4.22 - 5.81 MIL/uL   Hemoglobin 9.2 (L) 13.0 - 17.0 g/dL   HCT 29.3 (L) 39.0 - 52.0 %   MCV 72.7 (L) 78.0 - 100.0 fL   MCH 22.8 (L) 26.0 - 34.0 pg   MCHC 31.4 30.0 - 36.0 g/dL   RDW 17.1 (H) 11.5 - 15.5 %  Platelets 454 (H) 150 - 400 K/uL   Neutrophils Relative % 78 %   Neutro Abs 8.7 (H) 1.7 - 7.7 K/uL   Lymphocytes Relative 12 %   Lymphs Abs 1.3 0.7 - 4.0 K/uL   Monocytes Relative 8 %   Monocytes Absolute 0.9 0.1 - 1.0 K/uL   Eosinophils Relative 2 %   Eosinophils Absolute 0.2 0.0 - 0.7 K/uL   Basophils Relative 0 %   Basophils Absolute 0.0 0.0 - 0.1 K/uL  I-Stat CG4 Lactic Acid, ED (Not at [facility])     Status: None   Collection Time: 01/29/16  6:31 PM  Result Value Ref Range   Lactic Acid, Venous 1.55 0.5 - 2.0 mmol/L  I-Stat CG4 Lactic Acid, ED (Not at [facility])     Status: None   Collection Time: 01/29/16 10:14 PM  Result Value Ref Range   Lactic Acid, Venous 1.20 0.5 - 2.0 mmol/L  Glucose, capillary     Status: Abnormal   Collection Time: 01/30/16 12:49 AM  Result Value Ref Range   Glucose-Capillary 234 (H) 65 - 99 mg/dL  Protime-INR     Status: Abnormal   Collection Time: 01/30/16  1:00 AM  Result Value Ref Range   Prothrombin Time 16.7 (H) 11.6 - 15.2 seconds   INR 1.34 0.00 - 1.49  APTT     Status: None   Collection Time: 01/30/16  1:00  AM  Result Value Ref Range   aPTT 30 24 - 37 seconds    Dg Foot Complete Right  01/30/2016  CLINICAL DATA:  Acute onset of right foot infection. Initial encounter.[id number] EXAM: RIGHT FOOT COMPLETE - 3+ VIEW COMPARISON:  None. FINDINGS: There is erosion of much of the first phalanx, with a moth-eaten appearance to the residual first phalanx. Surrounding soft tissue air tracks proximally about the first metatarsophalangeal joint. The appearance is suspicious for infection with a gas producing organism. Necrotizing fasciitis is a concern. No definite additional osseous erosions are characterized, though evaluation for osteomyelitis is limited on radiograph. Diffuse vascular calcifications are seen. Soft tissue swelling is noted about the ankle. Plantar and posterior calcaneal spurs are seen. An os trigonum is noted. Small os naviculare fragments are seen. IMPRESSION: 1. Erosion of much of the first phalanx, with a moth-eaten appearance to the residual first phalanx, compatible with severe osteomyelitis. Surrounding soft tissue air tracks proximally about the first metatarsophalangeal joint. The appearance is suspicious for infection with a gas producing organism. Necrotizing fasciitis is a concern. 2. No definite additional osseous erosions seen, though evaluation for additional osteomyelitis is limited on radiograph. 3. Diffuse vascular calcifications seen. 4. Os trigonum noted.  Small os naviculare fragments seen. These results were called by telephone at the time of interpretation on 01/30/2016 at 12:32 am to Dr. Arnoldo Morale, who verbally acknowledged these results. Electronically Signed   By: Garald Balding M.D.   On: 01/30/2016 00:36   ROS:  As above PE:  Blood pressure 165/81, pulse 104, temperature 99.3 F (37.4 C), temperature source Oral, resp. rate 20, weight 93.759 kg (206 lb 11.2 oz), SpO2 100 %. wn wd elderly male in nad.  A and O x 4.  Mood and affect normal.  EOMI.  resp unlabored.  R hallux with dry  gangrene to the base of the toe.  Skin at the 2nd toe base is compromised.  2+ dp pulse.  Sens to LT diminshed at the forefoot.  Diffuse swelling about the foot to the ankle.  5/5 strength  in PF and DF of the ankle.  No crepitus of the foot with palpation.  No lymphadenopathy.  Assessment/Plan:  Right forefoot gangrene and cellulitis - agree with IV abx.  This will hopefully allow the compromised soft tissues to demarcate within a day or two.  I don't believe there are signs or symptoms of necrotizing fasciitis.  He will require amputation of at least the hallux but more likely the entire forefoot given the extensive involvement of the hallux and 2nd toe.  We'll plan surgery for early in the week.  Wylene Simmer 01/30/2016, 1:39 AM

## 2016-01-30 NOTE — Progress Notes (Signed)
[facility]                                                                              Patient Demographics  [name], is a 70 y.o. male, DOB - 07-17-1946, ZOX:[id number]  Admit date - 01/29/2016   Admitting Physician Ron Parker, MD  Outpatient Primary MD for the patient is Theadora Rama., MD  LOS - 1  days    Chief Complaint  Patient presents with  . Nail Problem       Brief HPI   [name] is a 70 y.o. male with a history of DM2, HTN presented to ED with worsening pain, redness and swelling of his right foot, blackening of his right great toe for the past 3 weeks. Patient was on 2 different antibiotics, Keflex and clindamycin.  He reported that 3 weeks ago, he hit his toe on the corner of his bed and this pulled the toenail off. The wound continued to worsen. He was on a cruise to [place] prior to admission and he was taken off the ship because he needed further medical evaluation. In the ED, he was evaluated and placed on IV Vancomycin and Zosyn, and Orthopedics was consulted.Patient was admitted for further workup.   Assessment & Plan    Active Problems:   Osteomyelitis (HCC) right great toe, cellulitis of the right foot - Right foot x-ray showed occlusion of much of the first phalanx, severe osteomyelitis, soft tissue air tracks about the first MTP joint, necrotizing fasciitis is a concern - Orthopedics was consulted, seen by Dr. Victorino Dike, recommended OR early next week - Continue IV vancomycin and Zosyn, follow closely     Diabetes mellitus, type II uncontrolled - Uncontrolled, place on aggressive CBG control to aid in wound healing - Placed on Lantus 10 units daily, sliding scale insulin, follow hemoglobin A1c    Hypertension - Currently stable, continue metoprolol, home dose of Lasix  GERD Continue PPI  Hyperlipidemia Continue statin  Anemia - Microcytic, MCV 72, obtain anemia panel, FOBT  Code Status: Full CODE  STATUS  Family Communication: Discussed in detail with the patient, all imaging results, lab results explained to the patient and wife at the bedside    Disposition Plan:   Time Spent in minutes 25 minutes  Procedures  Foot x-ray  Consults   Orthopedics  DVT Prophylaxis  placed on heparin subcutaneous  Medications  Scheduled Meds: . atorvastatin  40 mg Oral q1800  . furosemide  40 mg Oral Daily  . insulin aspart  0-9 Units Subcutaneous 6 times per day  . loratadine  10 mg Oral Daily  . metoprolol  50 mg Oral 3 times per day  . multivitamin with minerals  1 tablet Oral Daily  . pantoprazole  40 mg Oral Daily  . piperacillin-tazobactam (ZOSYN)  IV  3.375 g Intravenous Q8H  . vancomycin  1,250 mg Intravenous Q12H   Continuous Infusions: . sodium chloride 75 mL/hr at 01/30/16 0502   PRN Meds:.acetaminophen **OR** acetaminophen, HYDROmorphone (DILAUDID) injection, ondansetron **OR** ondansetron (ZOFRAN) IV, oxyCODONE   Antibiotics   Anti-infectives    Start  Dose/Rate Route Frequency Ordered Stop   01/30/16 1000  vancomycin (VANCOCIN) 1,250 mg in sodium chloride 0.9 % 250 mL IVPB     1,250 mg 166.7 mL/hr over 90 Minutes Intravenous Every 12 hours 01/29/16 2057     01/30/16 0600  piperacillin-tazobactam (ZOSYN) IVPB 3.375 g     3.375 g 12.5 mL/hr over 240 Minutes Intravenous Every 8 hours 01/29/16 2057     01/29/16 2100  vancomycin (VANCOCIN) 2,000 mg in sodium chloride 0.9 % 500 mL IVPB     2,000 mg 250 mL/hr over 120 Minutes Intravenous  Once 01/29/16 2055 01/30/16 0058   01/29/16 2030  piperacillin-tazobactam (ZOSYN) IVPB 3.375 g     3.375 g 100 mL/hr over 30 Minutes Intravenous  Once 01/29/16 2027 01/29/16 2328   01/29/16 2030  vancomycin (VANCOCIN) IVPB 1000 mg/200 mL premix  Status:  Discontinued     1,000 mg 200 mL/hr over 60 Minutes Intravenous  Once 01/29/16 2027 01/29/16 2055        Subjective:   [name] was seen and examined today.  Patient  denies dizziness, chest pain, shortness of breath, abdominal pain, N/V/D/C, new weakness, numbess, tingling. No acute events overnight.    Objective:   Filed Vitals:   01/29/16 2230 01/30/16 0033 01/30/16 0036 01/30/16 0506  BP: 135/94  165/81 121/61  Pulse: 90  104 85  Temp:   99.3 F (37.4 C) 99.2 F (37.3 C)  TempSrc:   Oral Oral  Resp: 16  20 16   Weight:   93.759 kg (206 lb 11.2 oz)   SpO2: 98% 100% 100% 95%    Intake/Output Summary (Last 24 hours) at 01/30/16 1053 Last data filed at 01/30/16 0830  Gross per 24 hour  Intake    240 ml  Output      0 ml  Net    240 ml     Wt Readings from Last 3 Encounters:  01/30/16 93.759 kg (206 lb 11.2 oz)     Exam  General: Alert and oriented x 3, NAD  HEENT:  PERRLA, EOMI, Anicteric Sclera, mucous membranes moist.   Neck: Supple, no JVD, no masses  CVS: S1 S2 auscultated, no rubs, murmurs or gallops. Regular rate and rhythm.  Respiratory: Clear to auscultation bilaterally, no wheezing, rales or rhonchi  Abdomen: Soft, nontender, nondistended, + bowel sounds  Ext: no cyanosis clubbing or edema, right foot dressing intact  Neuro: AAOx3, Cr N's II- XII. Strength 5/5 upper and lower extremities bilaterally  Skin: No rashes  Psych: Normal affect and demeanor, alert and oriented x3    Data Reviewed:  I have personally reviewed following labs and imaging studies  Micro Results No results found for this or any previous visit (from the past 240 hour(s)).  Radiology Reports Dg Foot Complete Right  01/30/2016  CLINICAL DATA:  Acute onset of right foot infection. Initial encounter.[id number] EXAM: RIGHT FOOT COMPLETE - 3+ VIEW COMPARISON:  None. FINDINGS: There is erosion of much of the first phalanx, with a moth-eaten appearance to the residual first phalanx. Surrounding soft tissue air tracks proximally about the first metatarsophalangeal joint. The appearance is suspicious for infection with a gas producing organism. Necrotizing  fasciitis is a concern. No definite additional osseous erosions are characterized, though evaluation for osteomyelitis is limited on radiograph. Diffuse vascular calcifications are seen. Soft tissue swelling is noted about the ankle. Plantar and posterior calcaneal spurs are seen. An os trigonum is noted. Small os naviculare fragments are seen. IMPRESSION: 1.  Erosion of much of the first phalanx, with a moth-eaten appearance to the residual first phalanx, compatible with severe osteomyelitis. Surrounding soft tissue air tracks proximally about the first metatarsophalangeal joint. The appearance is suspicious for infection with a gas producing organism. Necrotizing fasciitis is a concern. 2. No definite additional osseous erosions seen, though evaluation for additional osteomyelitis is limited on radiograph. 3. Diffuse vascular calcifications seen. 4. Os trigonum noted.  Small os naviculare fragments seen. These results were called by telephone at the time of interpretation on 01/30/2016 at 12:32 am to Dr. Lovell Sheehan, who verbally acknowledged these results. Electronically Signed   By: Roanna Raider M.D.   On: 01/30/2016 00:36    CBC  Recent Labs Lab 01/29/16 1821 01/30/16 0320  WBC 11.1* 7.6  HGB 9.2* 8.0*  HCT 29.3* 25.2*  PLT 454* 364  MCV 72.7* 72.8*  MCH 22.8* 23.1*  MCHC 31.4 31.7  RDW 17.1* 17.1*  LYMPHSABS 1.3  --   MONOABS 0.9  --   EOSABS 0.2  --   BASOSABS 0.0  --     Chemistries   Recent Labs Lab 01/29/16 1821 01/30/16 0320  NA 133* 138  K 4.2 3.8  CL 99* 106  CO2 21* 24  GLUCOSE 364* 205*  BUN 19 15  CREATININE 1.10 0.87  CALCIUM 9.6 8.8*  AST 31  --   ALT 31  --   ALKPHOS 83  --   BILITOT 0.4  --    ------------------------------------------------------------------------------------------------------------------ CrCl cannot be calculated (Unknown ideal  weight.). ------------------------------------------------------------------------------------------------------------------ No results for input(s): HGBA1C in the last 72 hours. ------------------------------------------------------------------------------------------------------------------ No results for input(s): CHOL, HDL, LDLCALC, TRIG, CHOLHDL, LDLDIRECT in the last 72 hours. ------------------------------------------------------------------------------------------------------------------ No results for input(s): TSH, T4TOTAL, T3FREE, THYROIDAB in the last 72 hours.  Invalid input(s): FREET3 ------------------------------------------------------------------------------------------------------------------ No results for input(s): VITAMINB12, FOLATE, FERRITIN, TIBC, IRON, RETICCTPCT in the last 72 hours.  Coagulation profile  Recent Labs Lab 01/30/16 0100  INR 1.34    No results for input(s): DDIMER in the last 72 hours.  Cardiac Enzymes No results for input(s): CKMB, TROPONINI, MYOGLOBIN in the last 168 hours.  Invalid input(s): CK ------------------------------------------------------------------------------------------------------------------ Invalid input(s): POCBNP   Recent Labs  01/29/16 1818 01/30/16 0049 01/30/16 0448  GLUCAP 370* 234* 174*     Saadiya Wilfong M.D. [facility] 01/30/2016, 10:53 AM  Pager: [phone] Between 7am to 7pm - call Pager - [phone]  After 7pm go to [email or web address] - password [email or web address]  Call night coverage person covering after 7pm

## 2016-01-30 NOTE — Progress Notes (Signed)
Patient received to room from the ED. Patient oriented to room, tele monitor verified, admitting physician paged. Call light within reach.

## 2016-01-31 ENCOUNTER — Other Ambulatory Visit: Payer: Self-pay | Admitting: Orthopedic Surgery

## 2016-01-31 ENCOUNTER — Encounter (HOSPITAL_COMMUNITY): Payer: Self-pay | Admitting: General Practice

## 2016-01-31 LAB — CBC
HEMATOCRIT: 25.3 % — AB (ref 39.0–52.0)
Hemoglobin: 7.9 g/dL — ABNORMAL LOW (ref 13.0–17.0)
MCH: 23.1 pg — ABNORMAL LOW (ref 26.0–34.0)
MCHC: 31.2 g/dL (ref 30.0–36.0)
MCV: 74 fL — ABNORMAL LOW (ref 78.0–100.0)
PLATELETS: 339 10*3/uL (ref 150–400)
RBC: 3.42 MIL/uL — ABNORMAL LOW (ref 4.22–5.81)
RDW: 17.4 % — AB (ref 11.5–15.5)
WBC: 8.3 10*3/uL (ref 4.0–10.5)

## 2016-01-31 LAB — BASIC METABOLIC PANEL
Anion gap: 9 (ref 5–15)
BUN: 15 mg/dL (ref 6–20)
CO2: 23 mmol/L (ref 22–32)
Calcium: 8.6 mg/dL — ABNORMAL LOW (ref 8.9–10.3)
Chloride: 106 mmol/L (ref 101–111)
Creatinine, Ser: 1.02 mg/dL (ref 0.61–1.24)
GFR calc Af Amer: >60 mL/min (ref >60)
Glucose, Bld: 297 mg/dL — ABNORMAL HIGH (ref 65–99)
Potassium: 4.1 mmol/L (ref 3.5–5.1)
SODIUM: 138 mmol/L (ref 135–145)

## 2016-01-31 LAB — GLUCOSE, CAPILLARY
GLUCOSE-CAPILLARY: 338 mg/dL — AB (ref 65–99)
Glucose-Capillary: 279 mg/dL — ABNORMAL HIGH (ref 65–99)
Glucose-Capillary: 311 mg/dL — ABNORMAL HIGH (ref 65–99)
Glucose-Capillary: 324 mg/dL — ABNORMAL HIGH (ref 65–99)

## 2016-01-31 LAB — HEMOGLOBIN AND HEMATOCRIT, BLOOD
HEMATOCRIT: 28.8 % — AB (ref 39.0–52.0)
HEMOGLOBIN: 8.8 g/dL — AB (ref 13.0–17.0)

## 2016-01-31 LAB — PREPARE RBC (CROSSMATCH)

## 2016-01-31 LAB — ABO/RH: ABO/RH(D): A NEG

## 2016-01-31 LAB — VANCOMYCIN, TROUGH: VANCOMYCIN TR: 19 ug/mL (ref 10.0–20.0)

## 2016-01-31 LAB — HEMOGLOBIN A1C
Hgb A1c MFr Bld: 12.8 % — ABNORMAL HIGH (ref 4.8–5.6)
Mean Plasma Glucose: 321 mg/dL

## 2016-01-31 MED ORDER — CHLORHEXIDINE GLUCONATE 4 % EX LIQD
60.0000 mL | Freq: Once | CUTANEOUS | Status: AC
  Administered 2016-02-01: 4 via TOPICAL

## 2016-01-31 MED ORDER — SODIUM CHLORIDE 0.9 % IV SOLN
Freq: Once | INTRAVENOUS | Status: AC
  Administered 2016-01-31: 15:00:00 via INTRAVENOUS

## 2016-01-31 MED ORDER — INSULIN GLARGINE 100 UNIT/ML ~~LOC~~ SOLN
15.0000 [IU] | Freq: Every day | SUBCUTANEOUS | Status: DC
  Administered 2016-01-31: 15 [IU] via SUBCUTANEOUS
  Filled 2016-01-31 (×2): qty 0.15

## 2016-01-31 MED ORDER — SODIUM CHLORIDE 0.9 % IV SOLN
INTRAVENOUS | Status: DC
  Administered 2016-02-01: 11:00:00 via INTRAVENOUS

## 2016-01-31 MED ORDER — BISACODYL 10 MG RE SUPP: 10.0000 mg | Freq: Once | RECTAL | Status: DC

## 2016-01-31 MED ORDER — HEPARIN SODIUM (PORCINE) 5000 UNIT/ML IJ SOLN
5000.0000 [IU] | Freq: Three times a day (TID) | INTRAMUSCULAR | Status: DC
  Administered 2016-01-31 – 2016-02-03 (×7): 5000 [IU] via SUBCUTANEOUS
  Filled 2016-01-31 (×7): qty 1

## 2016-01-31 MED ORDER — CEFAZOLIN SODIUM-DEXTROSE 2-4 GM/100ML-% IV SOLN
2.0000 g | INTRAVENOUS | Status: DC
  Filled 2016-01-31: qty 100

## 2016-01-31 MED ORDER — LIVING WELL WITH DIABETES BOOK
Freq: Once | Status: AC
  Administered 2016-01-31: 15:00:00
  Filled 2016-01-31: qty 1

## 2016-01-31 MED ORDER — INSULIN ASPART 100 UNIT/ML ~~LOC~~ SOLN
3.0000 [IU] | Freq: Three times a day (TID) | SUBCUTANEOUS | Status: DC
  Administered 2016-01-31: 3 [IU] via SUBCUTANEOUS

## 2016-01-31 MED ORDER — DOCUSATE SODIUM 100 MG PO CAPS
100.0000 mg | ORAL_CAPSULE | Freq: Two times a day (BID) | ORAL | Status: DC
  Administered 2016-01-31 – 2016-02-03 (×5): 100 mg via ORAL
  Filled 2016-01-31 (×6): qty 1

## 2016-01-31 MED ORDER — POLYETHYLENE GLYCOL 3350 17 G PO PACK
17.0000 g | PACK | Freq: Once | ORAL | Status: AC
  Administered 2016-01-31: 17 g via ORAL
  Filled 2016-01-31: qty 1

## 2016-01-31 NOTE — Progress Notes (Signed)
Results for [name], Fox (MRN [id number]) as of 01/31/2016 08:26  Ref. Range 01/30/2016 12:07 01/30/2016 16:40 01/30/2016 21:45 01/31/2016 06:03  Glucose-Capillary Latest Ref Range: 65-99 mg/dL 409307 (H) 811314 (H) 914322 (H) 279 (H)  Noted that CBGs continue to be elevated.  Recommend increasing Lantus to 15 units daily and continuing Novolog MODERATE correction scale TID & HS. Will continue to monitor blood sugars while in the hospital. Smith MinceKendra Tamotsu Wiederholt RN BSN CDE

## 2016-01-31 NOTE — Progress Notes (Signed)
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  Tolerating POs well.  Admits to BM.  Denies fever chills N/V.  Denies fever, chills, N/V.  Anxious about planned surgical intervention.  Objective:   VITALS:  Temp:  [99.5 F (37.5 C)] 99.5 F (37.5 C) (04/03 0500) Pulse Rate:  [83-90] 83 (04/03 0937) Resp:  [16-20] 20 (04/03 0500) BP: (116-132)/(62-72) 116/62 mmHg (04/03 0937) SpO2:  [97 %-98 %] 98 % (04/03 0937)  General: WDWN patient in NAD. Psych:  Appropriate mood and affect. Neuro:  A&O x 3, Moving all extremities, sensation intact to light touch HEENT:  EOMs intact Chest:  Even non-labored respirations Skin:  Dressing C/D/I, no rashes or lesions Extremities: warm/dry, mild RLE edema and erythmea, no echymosis.  No lymphadenopathy. 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 Victorino DikeHewitt. NPO after midnight. Stop heparin after 2200 dosing, to be restarted after surgical intervention.  Alfredo MartinezJustin Brealynn Contino, PA-C, [facility] Office:  [phone]

## 2016-01-31 NOTE — Progress Notes (Signed)
Spoke with patient about his diabetes.  Was diagnosed with DM about 25 years ago.  Has never been on insulin. Has been having problems with infection and cellulitis if right foot.  States that he does not check blood sugars at home routinely.  Demonstrated the insulin pen to patient and his wife.  Will have staff RN's teach patient to give own injections if patient is discharge on insulin. Recommended that a dietician speak with them about meal planning. Will order Living Well with Diabetes booklet for information on general diabetes care.HgbA1C is 12.8%. Patient does not know what his previous A1C's have been.  Will continue to monitor blood sugars while in the hospital. Smith MinceKendra Danielle Lento RN BSN CDE

## 2016-01-31 NOTE — Progress Notes (Signed)
Pharmacy Antibiotic Note Todd LootsFoye Guzman is a 70 y.o. male admitted on 01/29/2016 that is currently on day 3 of Zosyn and vancomycin for R foot gangrene and cellulitis. Sx planned for 4/4  VT 19 on 1250 mg Q 12 hours   Plan: 1. Vancomycin trough of 19 is within desired range of 15 - 20; continue with current dosing  2. Continue EI Zosyn 3.375 grams every 8 hours  3. F/u LOT of abx post sx   Weight: 206 lb 11.2 oz (93.759 kg)  Temp (24hrs), Avg:99.3 F (37.4 C), Min:99 F (37.2 C), Max:99.7 F (37.6 C)   Recent Labs Lab 01/29/16 1821 01/29/16 1831 01/29/16 2214 01/30/16 0100 01/30/16 0320 01/31/16 0236 01/31/16 2127  WBC 11.1*  --   --   --  7.6 8.3  --   CREATININE 1.10  --   --   --  0.87 1.02  --   LATICACIDVEN  --  1.55 1.20 1.1 0.6  --   --   VANCOTROUGH  --   --   --   --   --   --  19    CrCl cannot be calculated (Unknown ideal weight.).    No Known Allergies  Antimicrobials this admission: 4/1 Vancomycin >>   4/1 Zosyn >>   Dose adjustments this admission: N/a  Microbiology results: 4/1 BCx2: ngtd   Thank you for allowing pharmacy to be a part of this patient's care.  [name] 01/31/2016 10:32 PM

## 2016-01-31 NOTE — Progress Notes (Signed)
[facility]                                                                              Patient Demographics  [name], is a 70 y.o. male, DOB - [birth year], ZOX:[id number]  Admit date - 01/29/2016   Admitting Physician Ron Parker, MD  Outpatient Primary MD for the patient is Theadora Rama., MD  LOS - 2  days    Chief Complaint  Patient presents with  . Nail Problem       Brief HPI   [name] is a 70 y.o. male with a history of DM2, HTN presented to ED with worsening pain, redness and swelling of his right foot, blackening of his right great toe for the past 3 weeks. Patient was on 2 different antibiotics, Keflex and clindamycin.  He reported that 3 weeks ago, he hit his toe on the corner of his bed and this pulled the toenail off. The wound continued to worsen. He was on a cruise to [place] prior to admission and he was taken off the ship because he needed further medical evaluation. In the ED, he was evaluated and placed on IV Vancomycin and Zosyn, and Orthopedics was consulted.Patient was admitted for further workup.   Assessment & Plan    Active Problems:   Osteomyelitis (HCC) right great toe, cellulitis of the right foot - Right foot x-ray showed occlusion of much of the first phalanx, severe osteomyelitis, soft tissue air tracks about the first MTP joint, necrotizing fasciitis is a concern - Orthopedics was consulted, seen by Dr. Victorino Dike, recommended OR Tomorrow - Continue IV vancomycin and Zosyn, follow closely     Diabetes mellitus, type II uncontrolled - Uncontrolled, place on aggressive CBG control to aid in wound healing - Increased  Lantus to 15 units, added meal coverage, continue sliding scale insulin  - Severely uncontrolled hemoglobin A1c 12.8    Hypertension - Currently stable, continue metoprolol, home dose of Lasix  GERD Continue PPI  Hyperlipidemia Continue statin  Anemia: Iron deficiency - Microcytic, MCV  72, obtain anemia panel, FOBT - Transfuse 1 unit packed RBC, hemoglobin 7.9 today, pending over tomorrow - Discussed in detail with the patient about outpatient GI workup and colonoscopy.  Constipation - Patient reports that he has not had a BM in atleast in a week, placed on Colace, MiraLAX and Dulcolax suppository  Code Status: Full CODE STATUS  Family Communication: Discussed in detail with the patient, all imaging results, lab results explained to the patient and wife at the bedside    Disposition Plan:   Time Spent in minutes 25 minutes  Procedures  Foot x-ray  Consults   Orthopedics  DVT Prophylaxis  placed on heparin subcutaneous  Medications  Scheduled Meds: . sodium chloride   Intravenous Once  . atorvastatin  40 mg Oral q1800  . bisacodyl  10 mg Rectal Once  . docusate sodium  100 mg Oral BID  . furosemide  40 mg Oral Daily  . heparin subcutaneous  5,000 Units Subcutaneous 3 times per day  . insulin aspart  0-15 Units Subcutaneous TID WC  . insulin aspart  0-5 Units Subcutaneous QHS  . insulin glargine  10 Units Subcutaneous QHS  . loratadine  10 mg Oral Daily  . metoprolol  50 mg Oral 3 times per day  . multivitamin with minerals  1 tablet Oral Daily  . pantoprazole  40 mg Oral Daily  . piperacillin-tazobactam (ZOSYN)  IV  3.375 g Intravenous Q8H  . polyethylene glycol  17 g Oral Once  . vancomycin  1,250 mg Intravenous Q12H   Continuous Infusions:   PRN Meds:.acetaminophen **OR** acetaminophen, HYDROmorphone (DILAUDID) injection, ondansetron **OR** ondansetron (ZOFRAN) IV, oxyCODONE   Antibiotics   Anti-infectives    Start     Dose/Rate Route Frequency Ordered Stop   01/30/16 1000  vancomycin (VANCOCIN) 1,250 mg in sodium chloride 0.9 % 250 mL IVPB     1,250 mg 166.7 mL/hr over 90 Minutes Intravenous Every 12 hours 01/29/16 2057     01/30/16 0600  piperacillin-tazobactam (ZOSYN) IVPB 3.375 g     3.375 g 12.5 mL/hr over 240 Minutes Intravenous  Every 8 hours 01/29/16 2057     01/29/16 2100  vancomycin (VANCOCIN) 2,000 mg in sodium chloride 0.9 % 500 mL IVPB     2,000 mg 250 mL/hr over 120 Minutes Intravenous  Once 01/29/16 2055 01/30/16 0058   01/29/16 2030  piperacillin-tazobactam (ZOSYN) IVPB 3.375 g     3.375 g 100 mL/hr over 30 Minutes Intravenous  Once 01/29/16 2027 01/29/16 2328   01/29/16 2030  vancomycin (VANCOCIN) IVPB 1000 mg/200 mL premix  Status:  Discontinued     1,000 mg 200 mL/hr over 60 Minutes Intravenous  Once 01/29/16 2027 01/29/16 2055        Subjective:   [name] was seen and examined today.  Patient denies dizziness, chest pain, shortness of breath, abdominal pain, N/V/D/C, new weakness, numbess, tingling. No acute events overnight.  Pain is controlled.  Objective:   Filed Vitals:   01/30/16 1202 01/30/16 1957 01/31/16 0500 01/31/16 0937  BP: 125/72 127/65 132/72 116/62  Pulse: 81 86 90 83  Temp: 98.7 F (37.1 C)  99.5 F (37.5 C)   TempSrc: Oral  Oral   Resp: 18 16 20    Weight:      SpO2: 99% 98% 97% 98%    Intake/Output Summary (Last 24 hours) at 01/31/16 1203 Last data filed at 01/31/16 0730  Gross per 24 hour  Intake    240 ml  Output      0 ml  Net    240 ml     Wt Readings from Last 3 Encounters:  01/30/16 93.759 kg (206 lb 11.2 oz)     Exam  General: Alert and oriented x 3, NAD  HEENT:    Neck:   CVS: S1 S2 auscultated, no rubs, murmurs or gallops. Regular rate and rhythm.  Respiratory: Clear to auscultation bilaterally, no wheezing, rales or rhonchi  Abdomen: Soft, nontender, nondistended, + bowel sounds  Ext: no cyanosis clubbing or edema, right foot dressing intact  Neuro: No new deficits  Skin: No rashes  Psych: Normal affect and demeanor, alert and oriented x3    Data Reviewed:  I have personally reviewed following labs and imaging studies  Micro Results Recent Results (from the past 240 hour(s))  Blood Culture (routine x 2)     Status: None  (Preliminary result)   Collection Time: 01/29/16  9:51 PM  Result Value Ref Range Status   Specimen Description BLOOD RIGHT ARM  Final   Special Requests BOTTLES DRAWN AEROBIC  AND ANAEROBIC 5CC EACH  Final   Culture NO GROWTH 2 DAYS  Final   Report Status PENDING  Incomplete  Blood Culture (routine x 2)     Status: None (Preliminary result)   Collection Time: 01/29/16 10:00 PM  Result Value Ref Range Status   Specimen Description BLOOD LEFT ARM  Final   Special Requests BOTTLES DRAWN AEROBIC AND ANAEROBIC 5CC EACH  Final   Culture NO GROWTH 2 DAYS  Final   Report Status PENDING  Incomplete    Radiology Reports Dg Foot Complete Right  01/30/2016  CLINICAL DATA:  Acute onset of right foot infection. Initial encounter.1610 EXAM: RIGHT FOOT COMPLETE - 3+ VIEW COMPARISON:  None. FINDINGS: There is erosion of much of the first phalanx, with a moth-eaten appearance to the residual first phalanx. Surrounding soft tissue air tracks proximally about the first metatarsophalangeal joint. The appearance is suspicious for infection with a gas producing organism. Necrotizing fasciitis is a concern. No definite additional osseous erosions are characterized, though evaluation for osteomyelitis is limited on radiograph. Diffuse vascular calcifications are seen. Soft tissue swelling is noted about the ankle. Plantar and posterior calcaneal spurs are seen. An os trigonum is noted. Small os naviculare fragments are seen. IMPRESSION: 1. Erosion of much of the first phalanx, with a moth-eaten appearance to the residual first phalanx, compatible with severe osteomyelitis. Surrounding soft tissue air tracks proximally about the first metatarsophalangeal joint. The appearance is suspicious for infection with a gas producing organism. Necrotizing fasciitis is a concern. 2. No definite additional osseous erosions seen, though evaluation for additional osteomyelitis is limited on radiograph. 3. Diffuse vascular calcifications  seen. 4. Os trigonum noted.  Small os naviculare fragments seen. These results were called by telephone at the time of interpretation on 01/30/2016 at 12:32 am to Dr. Lovell Sheehan, who verbally acknowledged these results. Electronically Signed   By: Roanna Raider M.D.   On: 01/30/2016 00:36    CBC  Recent Labs Lab 01/29/16 1821 01/30/16 0320 01/31/16 0236  WBC 11.1* 7.6 8.3  HGB 9.2* 8.0* 7.9*  HCT 29.3* 25.2* 25.3*  PLT 454* 364 339  MCV 72.7* 72.8* 74.0*  MCH 22.8* 23.1* 23.1*  MCHC 31.4 31.7 31.2  RDW 17.1* 17.1* 17.4*  LYMPHSABS 1.3  --   --   MONOABS 0.9  --   --   EOSABS 0.2  --   --   BASOSABS 0.0  --   --     Chemistries   Recent Labs Lab 01/29/16 1821 01/30/16 0320 01/31/16 0236  NA 133* 138 138  K 4.2 3.8 4.1  CL 99* 106 106  CO2 21* 24 23  GLUCOSE 364* 205* 297*  BUN CREATININE 1.10 0.87 1.02  CALCIUM 9.6 8.8* 8.6*  AST 31  --   --   ALT 31  --   --   ALKPHOS 83  --   --   BILITOT 0.4  --   --    ------------------------------------------------------------------------------------------------------------------ CrCl cannot be calculated (Unknown ideal weight.). ------------------------------------------------------------------------------------------------------------------  Recent Labs  01/30/16 0320  HGBA1C 12.8*   ------------------------------------------------------------------------------------------------------------------ No results for input(s): CHOL, HDL, LDLCALC, TRIG, CHOLHDL, LDLDIRECT in the last 72 hours. ------------------------------------------------------------------------------------------------------------------ No results for input(s): TSH, T4TOTAL, T3FREE, THYROIDAB in the last 72 hours.  Invalid input(s): FREET3 ------------------------------------------------------------------------------------------------------------------  Recent Labs  01/30/16 1319  VITAMINB12 425  FOLATE 30.1  FERRITIN 67  TIBC 272  IRON 13*   RETICCTPCT 1.6    Coagulation profile  Recent Labs Lab 01/30/16 0100  INR 1.34    No results for input(s): DDIMER in the last 72 hours.  Cardiac Enzymes No results for input(s): CKMB, TROPONINI, MYOGLOBIN in the last 168 hours.  Invalid input(s): CK ------------------------------------------------------------------------------------------------------------------ Invalid input(s): POCBNP   Recent Labs  01/30/16 0448 01/30/16 1207 01/30/16 1640 01/30/16 2145 01/31/16 0603 01/31/16 1057  GLUCAP 174* 307* 314* 322* 279* 311*     Takumi Din M.D. [facility] 01/31/2016, 12:03 PM  Pager: [phone] Between 7am to 7pm - call Pager - [phone]  After 7pm go to [email or web address] - password [email or web address]  Call night coverage person covering after 7pm

## 2016-02-01 ENCOUNTER — Inpatient Hospital Stay (HOSPITAL_COMMUNITY): Payer: Medicare Other | Admitting: Certified Registered Nurse Anesthetist

## 2016-02-01 ENCOUNTER — Encounter (HOSPITAL_COMMUNITY): Payer: Self-pay | Admitting: Certified Registered Nurse Anesthetist

## 2016-02-01 ENCOUNTER — Encounter (HOSPITAL_COMMUNITY)
Admission: EM | Disposition: A | Discharge status: Home Health | Payer: Self-pay | Source: Home / Self Care | Attending: Internal Medicine

## 2016-02-01 HISTORY — PX: AMPUTATION: SHX166

## 2016-02-01 LAB — GLUCOSE, CAPILLARY
GLUCOSE-CAPILLARY: 244 mg/dL — AB (ref 65–99)
Glucose-Capillary: 249 mg/dL — ABNORMAL HIGH (ref 65–99)
Glucose-Capillary: 228 mg/dL — ABNORMAL HIGH (ref 65–99)
Glucose-Capillary: 205 mg/dL — ABNORMAL HIGH (ref 65–99)

## 2016-02-01 LAB — BASIC METABOLIC PANEL
Anion gap: 13 (ref 5–15)
BUN: 10 mg/dL (ref 6–20)
CHLORIDE: 104 mmol/L (ref 101–111)
CO2: 22 mmol/L (ref 22–32)
CREATININE: 0.92 mg/dL (ref 0.61–1.24)
Calcium: 8.6 mg/dL — ABNORMAL LOW (ref 8.9–10.3)
GFR calc Af Amer: >60 mL/min (ref >60)
GFR calc non Af Amer: >60 mL/min (ref >60)
Glucose, Bld: 274 mg/dL — ABNORMAL HIGH (ref 65–99)
Potassium: 3.6 mmol/L (ref 3.5–5.1)
Sodium: 139 mmol/L (ref 135–145)

## 2016-02-01 LAB — TYPE AND SCREEN
ABO/RH(D): A NEG
Antibody Screen: NEGATIVE
Unit division: 0

## 2016-02-01 LAB — CBC
HCT: 27.6 % — ABNORMAL LOW (ref 39.0–52.0)
Hemoglobin: 8.4 g/dL — ABNORMAL LOW (ref 13.0–17.0)
MCH: 22.6 pg — AB (ref 26.0–34.0)
MCHC: 30.4 g/dL (ref 30.0–36.0)
MCV: 74.2 fL — AB (ref 78.0–100.0)
PLATELETS: 347 10*3/uL (ref 150–400)
RBC: 3.72 MIL/uL — ABNORMAL LOW (ref 4.22–5.81)
RDW: 17.6 % — AB (ref 11.5–15.5)
WBC: 8.5 10*3/uL (ref 4.0–10.5)

## 2016-02-01 LAB — SURGICAL PCR SCREEN
MRSA, PCR: NEGATIVE
Staphylococcus aureus: NEGATIVE

## 2016-02-01 SURGERY — AMPUTATION, FOOT, PARTIAL
Anesthesia: Regional | Site: Foot | Laterality: Right

## 2016-02-01 MED ORDER — VANCOMYCIN HCL 500 MG IV SOLR
INTRAVENOUS | Status: AC
  Filled 2016-02-01: qty 500

## 2016-02-01 MED ORDER — FENTANYL CITRATE (PF) 250 MCG/5ML IJ SOLN
INTRAMUSCULAR | Status: AC
  Filled 2016-02-01: qty 5

## 2016-02-01 MED ORDER — FENTANYL CITRATE (PF) 100 MCG/2ML IJ SOLN: 25.0000 ug | INTRAMUSCULAR | Status: DC | PRN

## 2016-02-01 MED ORDER — LIDOCAINE HCL (CARDIAC) 20 MG/ML IV SOLN
INTRAVENOUS | Status: DC | PRN
  Administered 2016-02-01: 60 mg via INTRAVENOUS

## 2016-02-01 MED ORDER — ACETAMINOPHEN 325 MG PO TABS
650.0000 mg | ORAL_TABLET | Freq: Four times a day (QID) | ORAL | Status: DC | PRN
  Administered 2016-02-02: 650 mg via ORAL

## 2016-02-01 MED ORDER — ONDANSETRON HCL 4 MG/2ML IJ SOLN
INTRAMUSCULAR | Status: DC | PRN
  Administered 2016-02-01: 4 mg via INTRAVENOUS

## 2016-02-01 MED ORDER — INSULIN ASPART 100 UNIT/ML ~~LOC~~ SOLN
5.0000 [IU] | Freq: Three times a day (TID) | SUBCUTANEOUS | Status: DC
Start: 2016-02-01 — End: 2016-02-03
  Administered 2016-02-01 – 2016-02-03 (×6): 5 [IU] via SUBCUTANEOUS

## 2016-02-01 MED ORDER — SUCCINYLCHOLINE CHLORIDE 20 MG/ML IJ SOLN
INTRAMUSCULAR | Status: AC
  Filled 2016-02-01: qty 1

## 2016-02-01 MED ORDER — ONDANSETRON HCL 4 MG/2ML IJ SOLN: 4.0000 mg | Freq: Four times a day (QID) | INTRAMUSCULAR | Status: DC | PRN

## 2016-02-01 MED ORDER — OXYCODONE HCL 5 MG PO TABS: 5.0000 mg | ORAL_TABLET | Freq: Once | ORAL | Status: DC | PRN

## 2016-02-01 MED ORDER — PROPOFOL 10 MG/ML IV BOLUS
INTRAVENOUS | Status: AC
  Filled 2016-02-01: qty 20

## 2016-02-01 MED ORDER — BUPIVACAINE-EPINEPHRINE (PF) 0.5% -1:200000 IJ SOLN
INTRAMUSCULAR | Status: DC | PRN
  Administered 2016-02-01: 30 mL via PERINEURAL

## 2016-02-01 MED ORDER — OXYCODONE HCL 5 MG/5ML PO SOLN: 5.0000 mg | Freq: Once | ORAL | Status: DC | PRN

## 2016-02-01 MED ORDER — FENTANYL CITRATE (PF) 100 MCG/2ML IJ SOLN
INTRAMUSCULAR | Status: AC
  Administered 2016-02-01: 100 ug
  Filled 2016-02-01: qty 2

## 2016-02-01 MED ORDER — ACETAMINOPHEN 650 MG RE SUPP: 650.0000 mg | Freq: Four times a day (QID) | RECTAL | Status: DC | PRN

## 2016-02-01 MED ORDER — LACTATED RINGERS IV SOLN
INTRAVENOUS | Status: DC
  Administered 2016-02-01: 13:00:00 via INTRAVENOUS

## 2016-02-01 MED ORDER — METOCLOPRAMIDE HCL 5 MG PO TABS: 5.0000 mg | ORAL_TABLET | Freq: Three times a day (TID) | ORAL | Status: DC | PRN

## 2016-02-01 MED ORDER — METOCLOPRAMIDE HCL 5 MG/ML IJ SOLN: 5.0000 mg | Freq: Three times a day (TID) | INTRAMUSCULAR | Status: DC | PRN

## 2016-02-01 MED ORDER — MIDAZOLAM HCL 2 MG/2ML IJ SOLN
INTRAMUSCULAR | Status: AC
  Administered 2016-02-01: 2 mg
  Filled 2016-02-01: qty 2

## 2016-02-01 MED ORDER — MIDAZOLAM HCL 2 MG/2ML IJ SOLN
INTRAMUSCULAR | Status: AC
  Filled 2016-02-01: qty 2

## 2016-02-01 MED ORDER — ROCURONIUM BROMIDE 50 MG/5ML IV SOLN
INTRAVENOUS | Status: AC
  Filled 2016-02-01: qty 1

## 2016-02-01 MED ORDER — INSULIN GLARGINE 100 UNIT/ML ~~LOC~~ SOLN
18.0000 [IU] | Freq: Every day | SUBCUTANEOUS | Status: DC
  Administered 2016-02-01 – 2016-02-02 (×2): 18 [IU] via SUBCUTANEOUS
  Filled 2016-02-01 (×3): qty 0.18

## 2016-02-01 MED ORDER — LABETALOL HCL 5 MG/ML IV SOLN
INTRAVENOUS | Status: AC
  Filled 2016-02-01: qty 4

## 2016-02-01 MED ORDER — ONDANSETRON HCL 4 MG PO TABS: 4.0000 mg | ORAL_TABLET | Freq: Four times a day (QID) | ORAL | Status: DC | PRN

## 2016-02-01 MED ORDER — VANCOMYCIN HCL 500 MG IV SOLR
INTRAVENOUS | Status: DC | PRN
  Administered 2016-02-01: 500 mg via TOPICAL

## 2016-02-01 MED ORDER — BUPIVACAINE-EPINEPHRINE (PF) 0.5% -1:200000 IJ SOLN
INTRAMUSCULAR | Status: AC
  Filled 2016-02-01: qty 30

## 2016-02-01 MED ORDER — PROPOFOL 10 MG/ML IV BOLUS
INTRAVENOUS | Status: DC | PRN
  Administered 2016-02-01: 180 mg via INTRAVENOUS

## 2016-02-01 MED ORDER — ONDANSETRON HCL 4 MG/2ML IJ SOLN
INTRAMUSCULAR | Status: AC
  Filled 2016-02-01: qty 2

## 2016-02-01 MED ORDER — BACITRACIN ZINC 500 UNIT/GM EX OINT
TOPICAL_OINTMENT | CUTANEOUS | Status: AC
  Filled 2016-02-01: qty 28.35

## 2016-02-01 MED ORDER — LIDOCAINE HCL (CARDIAC) 20 MG/ML IV SOLN
INTRAVENOUS | Status: AC
  Filled 2016-02-01: qty 5

## 2016-02-01 MED ORDER — FENTANYL CITRATE (PF) 100 MCG/2ML IJ SOLN
INTRAMUSCULAR | Status: DC | PRN
  Administered 2016-02-01 (×4): 25 ug via INTRAVENOUS

## 2016-02-01 SURGICAL SUPPLY — 48 items
BANDAGE ELASTIC 4 VELCRO ST LF (GAUZE/BANDAGES/DRESSINGS) ×2 IMPLANT
BANDAGE ELASTIC 6 VELCRO ST LF (GAUZE/BANDAGES/DRESSINGS) ×2 IMPLANT
BLADE SAW SGTL MED 73X18.5 STR (BLADE) ×2 IMPLANT
BNDG COHESIVE 4X5 TAN STRL (GAUZE/BANDAGES/DRESSINGS) ×2 IMPLANT
BNDG ESMARK 4X9 LF (GAUZE/BANDAGES/DRESSINGS) ×2 IMPLANT
CANISTER SUCT 3000ML PPV (MISCELLANEOUS) ×2 IMPLANT
CUFF TOURNIQUET SINGLE 34IN LL (TOURNIQUET CUFF) IMPLANT
CUFF TOURNIQUET SINGLE 44IN (TOURNIQUET CUFF) IMPLANT
DRAPE U-SHAPE 47X51 STRL (DRAPES) ×4 IMPLANT
DRSG ADAPTIC 3X8 NADH LF (GAUZE/BANDAGES/DRESSINGS) ×2 IMPLANT
DRSG MEPITEL 4X7.2 (GAUZE/BANDAGES/DRESSINGS) ×2 IMPLANT
DRSG PAD ABDOMINAL 8X10 ST (GAUZE/BANDAGES/DRESSINGS) ×2 IMPLANT
DURAPREP 26ML APPLICATOR (WOUND CARE) ×2 IMPLANT
ELECT REM PT RETURN 9FT ADLT (ELECTROSURGICAL) ×2
ELECTRODE REM PT RTRN 9FT ADLT (ELECTROSURGICAL) ×1 IMPLANT
GAUZE SPONGE 4X4 12PLY STRL (GAUZE/BANDAGES/DRESSINGS) ×2 IMPLANT
GLOVE BIO SURGEON STRL SZ8 (GLOVE) ×4 IMPLANT
GLOVE BIOGEL PI IND STRL 8 (GLOVE) ×2 IMPLANT
GLOVE BIOGEL PI INDICATOR 8 (GLOVE) ×2
GLOVE ECLIPSE 7.5 STRL STRAW (GLOVE) ×4 IMPLANT
GOWN STRL REUS W/ TWL LRG LVL3 (GOWN DISPOSABLE) ×1 IMPLANT
GOWN STRL REUS W/ TWL XL LVL3 (GOWN DISPOSABLE) ×2 IMPLANT
GOWN STRL REUS W/TWL LRG LVL3 (GOWN DISPOSABLE) ×1
GOWN STRL REUS W/TWL XL LVL3 (GOWN DISPOSABLE) ×2
KIT BASIN OR (CUSTOM PROCEDURE TRAY) ×2 IMPLANT
KIT ROOM TURNOVER OR (KITS) ×2 IMPLANT
NEEDLE 25GAX1.5 (MISCELLANEOUS) IMPLANT
NS IRRIG 1000ML POUR BTL (IV SOLUTION) ×2 IMPLANT
PACK ORTHO EXTREMITY (CUSTOM PROCEDURE TRAY) ×2 IMPLANT
PAD ARMBOARD 7.5X6 YLW CONV (MISCELLANEOUS) ×4 IMPLANT
PAD CAST 4YDX4 CTTN HI CHSV (CAST SUPPLIES) ×1 IMPLANT
PADDING CAST ABS 4INX4YD NS (CAST SUPPLIES) ×1
PADDING CAST ABS 6INX4YD NS (CAST SUPPLIES) ×1
PADDING CAST ABS COTTON 4X4 ST (CAST SUPPLIES) ×1 IMPLANT
PADDING CAST ABS COTTON 6X4 NS (CAST SUPPLIES) ×1 IMPLANT
PADDING CAST COTTON 4X4 STRL (CAST SUPPLIES) ×1
SPONGE GAUZE 4X4 12PLY STER LF (GAUZE/BANDAGES/DRESSINGS) ×2 IMPLANT
SPONGE LAP 18X18 X RAY DECT (DISPOSABLE) ×2 IMPLANT
STOCKINETTE IMPERVIOUS LG (DRAPES) IMPLANT
SUCTION FRAZIER HANDLE 10FR (MISCELLANEOUS) ×1
SUCTION TUBE FRAZIER 10FR DISP (MISCELLANEOUS) ×1 IMPLANT
SUT ETHILON 2 0 PSLX (SUTURE) ×4 IMPLANT
SYR CONTROL 10ML LL (SYRINGE) IMPLANT
TOWEL OR 17X24 6PK STRL BLUE (TOWEL DISPOSABLE) ×2 IMPLANT
TOWEL OR 17X26 10 PK STRL BLUE (TOWEL DISPOSABLE) ×2 IMPLANT
TUBE CONNECTING 12X1/4 (SUCTIONS) ×2 IMPLANT
UNDERPAD 30X30 INCONTINENT (UNDERPADS AND DIAPERS) ×2 IMPLANT
WATER STERILE IRR 1000ML POUR (IV SOLUTION) ×2 IMPLANT

## 2016-02-01 NOTE — Anesthesia Preprocedure Evaluation (Signed)
Anesthesia Evaluation  Patient identified by MRN, date of birth, ID band Patient awake    Reviewed: Allergy & Precautions, NPO status , Patient's Chart, lab work & pertinent test results  Airway Mallampati: II   Neck ROM: full    Dental   Pulmonary neg pulmonary ROS,    breath sounds clear to auscultation       Cardiovascular hypertension,  Rhythm:regular Rate:Normal     Neuro/Psych    GI/Hepatic   Endo/Other  diabetes, Poorly Controlled, Type 2  Renal/GU      Musculoskeletal   Abdominal   Peds  Hematology   Anesthesia Other Findings   Reproductive/Obstetrics                             Anesthesia Physical Anesthesia Plan  ASA: III  Anesthesia Plan: General and Regional   Post-op Pain Management:  Regional for Post-op pain   Induction: Intravenous  Airway Management Planned: LMA  Additional Equipment:   Intra-op Plan:   Post-operative Plan:   Informed Consent: I have reviewed the patients History and Physical, chart, labs and discussed the procedure including the risks, benefits and alternatives for the proposed anesthesia with the patient or authorized representative who has indicated his/her understanding and acceptance.     Plan Discussed with: CRNA, Anesthesiologist and Surgeon  Anesthesia Plan Comments:         Anesthesia Quick Evaluation

## 2016-02-01 NOTE — Progress Notes (Signed)
[facility]                                                                              Patient Demographics  [name], is a 70 y.o. male, DOB - 1946/06/28, UEA:[id number]  Admit date - 01/29/2016   Admitting Physician Ron Parker, MD  Outpatient Primary MD for the patient is Theadora Rama., MD  LOS - 3  days    Chief Complaint  Patient presents with  . Nail Problem       Brief HPI   [name] is a 70 y.o. male with a history of DM2, HTN presented to ED with worsening pain, redness and swelling of his right foot, blackening of his right great toe for the past 3 weeks. Patient was on 2 different antibiotics, Keflex and clindamycin.  He reported that 3 weeks ago, he hit his toe on the corner of his bed and this pulled the toenail off. The wound continued to worsen. He was on a cruise to [place] prior to admission and he was taken off the ship because he needed further medical evaluation. In the ED, he was evaluated and placed on IV Vancomycin and Zosyn, and Orthopedics was consulted.Patient was admitted for further workup.   Assessment & Plan    Active Problems:   Osteomyelitis (HCC) right great toe, cellulitis of the right foot - Right foot x-ray showed occlusion of much of the first phalanx, severe osteomyelitis, soft tissue air tracks about the first MTP joint, necrotizing fasciitis is a concern - Orthopedics was consulted, seen by Dr. Victorino Dike,  OR today - Continue IV vancomycin and Zosyn, follow closely     Diabetes mellitus, type II uncontrolled - Uncontrolled, place on aggressive CBG control to aid in wound healing - Increased  Lantus to 18 units, Increase meal coverage 5units TID, continue sliding scale insulin  - Severely uncontrolled hemoglobin A1c 12.8    Hypertension - Currently stable, continue metoprolol, home dose of Lasix  GERD Continue PPI  Hyperlipidemia Continue statin  Anemia: Iron deficiency - Microcytic, MCV  72, FOBT pending  - Transfused 1 unit packed RBC, hemoglobin 8.4 today - Discussed in detail with the patient about outpatient GI workup and colonoscopy.  Constipation - Patient reports that he has not had a BM in atleast in a week, placed on Colace, MiraLAX and Dulcolax suppository  Code Status: Full CODE STATUS  Family Communication: Discussed in detail with the patient, all imaging results, lab results explained to the patient   Disposition Plan: OR today  Time Spent in minutes 25 minutes  Procedures  Foot x-ray  Consults   Orthopedics  DVT Prophylaxis  placed on heparin subcutaneous  Medications  Scheduled Meds: . atorvastatin  40 mg Oral q1800  . bisacodyl  10 mg Rectal Once  .  ceFAZolin (ANCEF) IV  2 g Intravenous To SS-Surg  . docusate sodium  100 mg Oral BID  . furosemide  40 mg Oral Daily  . heparin subcutaneous  5,000 Units Subcutaneous 3 times per day  . insulin aspart  0-15 Units Subcutaneous TID WC  . insulin aspart  0-5 Units Subcutaneous  QHS  . insulin aspart  3 Units Subcutaneous TID WC  . insulin glargine  15 Units Subcutaneous QHS  . loratadine  10 mg Oral Daily  . metoprolol  50 mg Oral 3 times per day  . multivitamin with minerals  1 tablet Oral Daily  . pantoprazole  40 mg Oral Daily  . piperacillin-tazobactam (ZOSYN)  IV  3.375 g Intravenous Q8H  . vancomycin  1,250 mg Intravenous Q12H   Continuous Infusions: . sodium chloride 75 mL/hr at 02/01/16 1039   PRN Meds:.acetaminophen **OR** acetaminophen, HYDROmorphone (DILAUDID) injection, ondansetron **OR** ondansetron (ZOFRAN) IV, oxyCODONE   Antibiotics   Anti-infectives    Start     Dose/Rate Route Frequency Ordered Stop   02/01/16 1300  ceFAZolin (ANCEF) IVPB 2g/100 mL premix     2 g 200 mL/hr over 30 Minutes Intravenous To [facility] 01/31/16 1417 02/02/16 1300   01/30/16 1000  vancomycin (VANCOCIN) 1,250 mg in sodium chloride 0.9 % 250 mL IVPB     1,250 mg 166.7 mL/hr over 90  Minutes Intravenous Every 12 hours 01/29/16 2057     01/30/16 0600  piperacillin-tazobactam (ZOSYN) IVPB 3.375 g     3.375 g 12.5 mL/hr over 240 Minutes Intravenous Every 8 hours 01/29/16 2057     01/29/16 2100  vancomycin (VANCOCIN) 2,000 mg in sodium chloride 0.9 % 500 mL IVPB     2,000 mg 250 mL/hr over 120 Minutes Intravenous  Once 01/29/16 2055 01/30/16 0058   01/29/16 2030  piperacillin-tazobactam (ZOSYN) IVPB 3.375 g     3.375 g 100 mL/hr over 30 Minutes Intravenous  Once 01/29/16 2027 01/29/16 2328   01/29/16 2030  vancomycin (VANCOCIN) IVPB 1000 mg/200 mL premix  Status:  Discontinued     1,000 mg 200 mL/hr over 60 Minutes Intravenous  Once 01/29/16 2027 01/29/16 2055        Subjective:   [name] was seen and examined today.No complaints, awaiting surgery.  Patient denies dizziness, chest pain, shortness of breath, abdominal pain, N/V/D/C, new weakness, numbess, tingling. No acute events overnight.  Pain is controlled.  Objective:   Filed Vitals:   01/31/16 2026 02/01/16 0400 02/01/16 0517 02/01/16 0959  BP: 145/79  158/78 135/66  Pulse: 94  91 74  Temp: 99.1 F (37.3 C)  99 F (37.2 C)   TempSrc: Oral  Oral   Resp: 18  18   Height:  5\' 9"  (1.753 m)    Weight:      SpO2: 97%  98% 99%    Intake/Output Summary (Last 24 hours) at 02/01/16 1113 Last data filed at 02/01/16 0600  Gross per 24 hour  Intake   1280 ml  Output      0 ml  Net   1280 ml     Wt Readings from Last 3 Encounters:  01/30/16 93.759 kg (206 lb 11.2 oz)     Exam  General: Alert and oriented x 3, NAD  HEENT:    Neck:   CVS: S1 S2clear, RRR  Respiratory: CTAB, no wheezing, rales or rhonchi  Abdomen: Soft, nontender, nondistended, + bowel sounds  Ext: no cyanosis clubbing or edema, right foot dressing intact  Neuro: No new deficits  Skin: No rashes  Psych: Normal affect and demeanor, alert and oriented x3    Data Reviewed:  I have personally reviewed following labs  and imaging studies  Micro Results Recent Results (from the past 240 hour(s))  Blood Culture (routine x 2)  Status: None (Preliminary result)   Collection Time: 01/29/16  9:51 PM  Result Value Ref Range Status   Specimen Description BLOOD RIGHT ARM  Final   Special Requests BOTTLES DRAWN AEROBIC AND ANAEROBIC 5CC EACH  Final   Culture NO GROWTH 2 DAYS  Final   Report Status PENDING  Incomplete  Blood Culture (routine x 2)     Status: None (Preliminary result)   Collection Time: 01/29/16 10:00 PM  Result Value Ref Range Status   Specimen Description BLOOD LEFT ARM  Final   Special Requests BOTTLES DRAWN AEROBIC AND ANAEROBIC 5CC EACH  Final   Culture NO GROWTH 2 DAYS  Final   Report Status PENDING  Incomplete  Surgical pcr screen     Status: None   Collection Time: 01/31/16 11:03 PM  Result Value Ref Range Status   MRSA, PCR NEGATIVE NEGATIVE Final   Staphylococcus aureus NEGATIVE NEGATIVE Final    Comment:        The Xpert SA Assay (FDA approved for NASAL specimens in patients over 110 years of age), is one component of a comprehensive surveillance program.  Test performance has been validated by [facility] for patients greater than or equal to 53 year old. It is not intended to diagnose infection nor to guide or monitor treatment.     Radiology Reports Dg Foot Complete Right  01/30/2016  CLINICAL DATA:  Acute onset of right foot infection. Initial encounter.1610 EXAM: RIGHT FOOT COMPLETE - 3+ VIEW COMPARISON:  None. FINDINGS: There is erosion of much of the first phalanx, with a moth-eaten appearance to the residual first phalanx. Surrounding soft tissue air tracks proximally about the first metatarsophalangeal joint. The appearance is suspicious for infection with a gas producing organism. Necrotizing fasciitis is a concern. No definite additional osseous erosions are characterized, though evaluation for osteomyelitis is limited on radiograph. Diffuse vascular  calcifications are seen. Soft tissue swelling is noted about the ankle. Plantar and posterior calcaneal spurs are seen. An os trigonum is noted. Small os naviculare fragments are seen. IMPRESSION: 1. Erosion of much of the first phalanx, with a moth-eaten appearance to the residual first phalanx, compatible with severe osteomyelitis. Surrounding soft tissue air tracks proximally about the first metatarsophalangeal joint. The appearance is suspicious for infection with a gas producing organism. Necrotizing fasciitis is a concern. 2. No definite additional osseous erosions seen, though evaluation for additional osteomyelitis is limited on radiograph. 3. Diffuse vascular calcifications seen. 4. Os trigonum noted.  Small os naviculare fragments seen. These results were called by telephone at the time of interpretation on 01/30/2016 at 12:32 am to Dr. Lovell Sheehan, who verbally acknowledged these results. Electronically Signed   By: Roanna Raider M.D.   On: 01/30/2016 00:36    CBC  Recent Labs Lab 01/29/16 1821 01/30/16 0320 01/31/16 0236 01/31/16 2127 02/01/16 0251  WBC 11.1* 7.6 8.3  --  8.5  HGB 9.2* 8.0* 7.9* 8.8* 8.4*  HCT 29.3* 25.2* 25.3* 28.8* 27.6*  PLT 454* 364 339  --  347  MCV 72.7* 72.8* 74.0*  --  74.2*  MCH 22.8* 23.1* 23.1*  --  22.6*  MCHC 31.4 31.7 31.2  --  30.4  RDW 17.1* 17.1* 17.4*  --  17.6*  LYMPHSABS 1.3  --   --   --   --   MONOABS 0.9  --   --   --   --   EOSABS 0.2  --   --   --   --  BASOSABS 0.0  --   --   --   --     Chemistries   Recent Labs Lab 01/29/16 1821 01/30/16 0320 01/31/16 0236 02/01/16 0251  NA 133* 138 138 139  K 4.2 3.8 4.1 3.6  CL 99* 106 106 104  CO2 21* GLUCOSE 364* 205* 297* 274*  BUN CREATININE 1.10 0.87 1.02 0.92  CALCIUM 9.6 8.8* 8.6* 8.6*  AST 31  --   --   --   ALT 31  --   --   --   ALKPHOS 83  --   --   --   BILITOT 0.4  --   --   --     ------------------------------------------------------------------------------------------------------------------ estimated creatinine clearance is 84.4 mL/min (by C-G formula based on Cr of 0.92). ------------------------------------------------------------------------------------------------------------------  Recent Labs  01/30/16 0320  HGBA1C 12.8*   ------------------------------------------------------------------------------------------------------------------ No results for input(s): CHOL, HDL, LDLCALC, TRIG, CHOLHDL, LDLDIRECT in the last 72 hours. ------------------------------------------------------------------------------------------------------------------ No results for input(s): TSH, T4TOTAL, T3FREE, THYROIDAB in the last 72 hours.  Invalid input(s): FREET3 ------------------------------------------------------------------------------------------------------------------  Recent Labs  01/30/16 1319  VITAMINB12 425  FOLATE 30.1  FERRITIN 67  TIBC 272  IRON 13*  RETICCTPCT 1.6    Coagulation profile  Recent Labs Lab 01/30/16 0100  INR 1.34    No results for input(s): DDIMER in the last 72 hours.  Cardiac Enzymes No results for input(s): CKMB, TROPONINI, MYOGLOBIN in the last 168 hours.  Invalid input(s): CK ------------------------------------------------------------------------------------------------------------------ Invalid input(s): POCBNP   Recent Labs  01/30/16 2145 01/31/16 0603 01/31/16 1057 01/31/16 1625 01/31/16 2023 02/01/16 0632  GLUCAP 322* 279* 311* 324* 338* 249*     RAI,RIPUDEEP M.D. [facility] 02/01/2016, 11:13 AM  Pager: [phone] Between 7am to 7pm - call Pager - [phone]  After 7pm go to [email or web address] - password [email or web address]  Call night coverage person covering after 7pm

## 2016-02-01 NOTE — Progress Notes (Signed)
Utilization review completed.  

## 2016-02-01 NOTE — Anesthesia Procedure Notes (Addendum)
Anesthesia Regional Block:  Popliteal block  Pre-Anesthetic Checklist: ,, timeout performed, Correct Patient, Correct Site, Correct Laterality, Correct Procedure, Correct Position, site marked, Risks and benefits discussed,  Surgical consent,  Pre-op evaluation,  At surgeon's request and post-op pain management  Laterality: Right  Prep: chloraprep       Needles:  Injection technique: Single-shot  Needle Type: Echogenic Stimulator Needle     Needle Length: 9cm 9 cm Needle Gauge: 21 and 21 G    Additional Needles:  Procedures: ultrasound guided (picture in chart) and nerve stimulator Popliteal block  Nerve Stimulator or Paresthesia:  Response: plantar flexion of foot, 0.45 mA,   Additional Responses:   Narrative:  Start time: 02/01/2016 1:40 PM End time: 02/01/2016 1:52 PM Injection made incrementally with aspirations every 5 mL.  Performed by: Personally  Anesthesiologist: HODIERNE, ADAM  Additional Notes: Functioning IV was confirmed and monitors were applied.  A 90mm 21ga Arrow echogenic stimulator needle was used. Sterile prep and drape,hand hygiene and sterile gloves were used.  Negative aspiration and negative test dose prior to incremental administration of local anesthetic. The patient tolerated the procedure well.  Ultrasound guidance: relevent anatomy identified, needle position confirmed, local anesthetic spread visualized around nerve(s), vascular puncture avoided.  Image printed for medical record.    Procedure Name: LMA Insertion Date/Time: 02/01/2016 2:45 PM Performed by: Faustino CongressWHITE, Ancil Dewan TENA Shrey Boike Pre-anesthesia Checklist: Patient identified, Emergency Drugs available, Suction available and Patient being monitored Patient Re-evaluated:Patient Re-evaluated prior to inductionOxygen Delivery Method: Circle System Utilized Preoxygenation: Pre-oxygenation with 100% oxygen Intubation Type: IV induction LMA: LMA inserted LMA Size: 5.0 Number of attempts: 1 Airway  Equipment and Method: Bite block Placement Confirmation: positive ETCO2 Tube secured with: Tape Dental Injury: Teeth and Oropharynx as per pre-operative assessment

## 2016-02-01 NOTE — Discharge Instructions (Signed)
Todd ArthursJohn Hewitt, MD [facility]  Please read the following information regarding your care after surgery.  Medications  You only need a prescription for the narcotic pain medicine (ex. oxycodone, Percocet, Norco).  All of the other medicines listed below are available over the counter. X acetominophen (Tylenol) 650 mg every 4-6 hours as you need for minor pain X oxycodone as prescribed for moderate to severe pain ?   Narcotic pain medicine (ex. oxycodone, Percocet, Vicodin) will cause constipation.  To prevent this problem, take the following medicines while you are taking any pain medicine. X docusate sodium (Colace) 100 mg twice a day X senna (Senokot) 2 tablets twice a day   Weight Bearing X Bear weight when you are able on your operated leg or foot in CAM boot.  Dressing X Keep your dressing clean and dry.  Dont put anything (coat hanger, pencil, etc) down inside of it.  If it gets damp, use a hair dryer on the cool setting to dry it.  If it gets soaked, call the office to schedule an appointment for a cast change. ? Remove your dressing 3 days after surgery and cover the incisions with dry dressings.    After your dressing, cast or splint is removed; you may shower, but do not soak or scrub the wound.  Allow the water to run over it, and then gently pat it dry.  Swelling It is normal for you to have swelling where you had surgery.  To reduce swelling and pain, keep your toes above your nose for at least 3 days after surgery.  It may be necessary to keep your foot or leg elevated for several weeks.  If it hurts, it should be elevated.  Follow Up Call my office at [phone] when you are discharged from the hospital or surgery center to schedule an appointment to be seen two weeks after surgery.  Call my office at [phone] if you develop a fever >101.5 F, nausea, vomiting, bleeding from the surgical site or severe pain.

## 2016-02-01 NOTE — Care Management Important Message (Signed)
Important Message  Patient Details  Name: [name] MRN: [id number] Date of Birth: 03/21/1946   Medicare Important Message Given:  Yes    Kyla BalzarineShealy, Ryliee Figge Abena 02/01/2016, 11:16 AM

## 2016-02-01 NOTE — Transfer of Care (Signed)
Immediate Anesthesia Transfer of Care Note  Patient: [name]  Procedure(s) Performed: Procedure(s): RIGHT TRANSMETATARSAL AMPUTATION (Right)  Patient Location: PACU  Anesthesia Type:GA combined with regional for post-op pain  Level of Consciousness: awake, alert , oriented and patient cooperative  Airway & Oxygen Therapy: Patient Spontanous Breathing and Patient connected to nasal cannula oxygen  Post-op Assessment: Report given to RN and Post -op Vital signs reviewed and stable  Post vital signs: Reviewed and stable  Last Vitals:  Filed Vitals:   02/01/16 1354 02/01/16 1400  BP: 123/70   Pulse: 75 81  Temp:    Resp: 14 23    Complications: No apparent anesthesia complications

## 2016-02-01 NOTE — Brief Op Note (Signed)
01/29/2016 - 02/01/2016  3:18 PM  PATIENT:  [name]  70 y.o. male  PRE-OPERATIVE DIAGNOSIS: 1.  RIGHT FOREFOOT GANGRENE      2.  Tight right heelcord  POST-OPERATIVE DIAGNOSIS: same  Procedure(s): 1.  RIGHT TRANSMETATARSAL AMPUTATION   2.  Right percutaneous achilles tendon lengthening  SURGEON:  Toni ArthursJohn Avalene Sealy, MD  ASSISTANT:  Alfredo MartinezJustin Ollis, PA-C  ANESTHESIA:   General  EBL:  minimal   TOURNIQUET:  18 min at 250 mm Hg  COMPLICATIONS:  None apparent  DISPOSITION:  Extubated, awake and stable to recovery.  DICTATION ID:  [id number]

## 2016-02-01 NOTE — Interval H&P Note (Signed)
History and Physical Interval Note:  02/01/2016 2:24 PM  [name]  has presented today for surgery, with the diagnosis of RIGHT FOREFOOT GANGRENE  The various methods of treatment have been discussed with the patient and family. After consideration of risks, benefits and other options for treatment, the patient has consented to  Procedure(s): RIGHT TRANSMETATARSAL AMPUTATION VERSES 1ST AND 2ND RAY AMPUTATION  (Right) as a surgical intervention .  The patient's history has been reviewed, patient examined, no change in status, stable for surgery.  I have reviewed the patient's chart and labs.  Questions were answered to the patient's satisfaction.    The risks and benefits of the alternative treatment options have been discussed in detail.  The patient wishes to proceed with surgery and specifically understands risks of bleeding, infection, nerve damage, blood clots, need for additional surgery, amputation and death.  Toni ArthursHEWITT, Chlora Mcbain

## 2016-02-02 ENCOUNTER — Encounter (HOSPITAL_COMMUNITY): Payer: Self-pay | Admitting: Orthopedic Surgery

## 2016-02-02 DIAGNOSIS — E119 Type 2 diabetes mellitus without complications: Secondary | ICD-10-CM

## 2016-02-02 DIAGNOSIS — M86171 Other acute osteomyelitis, right ankle and foot: Secondary | ICD-10-CM

## 2016-02-02 DIAGNOSIS — L03119 Cellulitis of unspecified part of limb: Secondary | ICD-10-CM

## 2016-02-02 DIAGNOSIS — L02619 Cutaneous abscess of unspecified foot: Secondary | ICD-10-CM

## 2016-02-02 LAB — CBC
HCT: 27.3 % — ABNORMAL LOW (ref 39.0–52.0)
HEMOGLOBIN: 8.4 g/dL — AB (ref 13.0–17.0)
MCH: 23.4 pg — AB (ref 26.0–34.0)
MCHC: 30.8 g/dL (ref 30.0–36.0)
MCV: 76 fL — ABNORMAL LOW (ref 78.0–100.0)
PLATELETS: 333 10*3/uL (ref 150–400)
RBC: 3.59 MIL/uL — AB (ref 4.22–5.81)
RDW: 17.9 % — ABNORMAL HIGH (ref 11.5–15.5)
WBC: 8.7 10*3/uL (ref 4.0–10.5)

## 2016-02-02 LAB — BASIC METABOLIC PANEL
ANION GAP: 12 (ref 5–15)
BUN: 10 mg/dL (ref 6–20)
CALCIUM: 8.4 mg/dL — AB (ref 8.9–10.3)
CO2: 22 mmol/L (ref 22–32)
CREATININE: 0.98 mg/dL (ref 0.61–1.24)
Chloride: 105 mmol/L (ref 101–111)
Glucose, Bld: 228 mg/dL — ABNORMAL HIGH (ref 65–99)
Potassium: 4 mmol/L (ref 3.5–5.1)
SODIUM: 139 mmol/L (ref 135–145)

## 2016-02-02 LAB — GLUCOSE, CAPILLARY
GLUCOSE-CAPILLARY: 239 mg/dL — AB (ref 65–99)
GLUCOSE-CAPILLARY: 214 mg/dL — AB (ref 65–99)
GLUCOSE-CAPILLARY: 209 mg/dL — AB (ref 65–99)
Glucose-Capillary: 173 mg/dL — ABNORMAL HIGH (ref 65–99)

## 2016-02-02 MED ORDER — OXYCODONE HCL 5 MG PO TABS
10.0000 mg | ORAL_TABLET | ORAL | Status: DC | PRN
  Administered 2016-02-02: 10 mg via ORAL
  Filled 2016-02-02 (×2): qty 2

## 2016-02-02 NOTE — Progress Notes (Signed)
Orthopedic Tech Progress Note Patient Details:  [name] July 01, 1946 [id number] Brace order completed by bio-tech vendor. Patient ID: [name], male   DOB: July 01, 1946, 70 y.o.   MRN: [id number]   [name], [name] 02/02/2016, 8:10 PM

## 2016-02-02 NOTE — Evaluation (Signed)
Physical Therapy Evaluation Patient Details Name: Kyl Givler MRN: [id number] DOB: [birth year] Today's Date: 02/02/2016   History of Present Illness  Pt is a 70 y/o male admitted with R forefoot gangrene. s/p transmetatarsal amputation and R tendo-Achilles lengthening. PMH: uncontrolled DM, HTN.  Clinical Impression  Patient presents with pain, generalized weakness, balance deficits and impaired mobility s/p above surgery. Instructed pt on how to donn/doff CAM boot and to wear shoe on RLE to decrease height difference.Tolerated gait training with RW requiring Min guard assist for safety due to imbalance. Discussed stair negotiation technique at home. Will plan for stair training tomorrow with wife prior to d/c. Pt has support from wife but she works 3 days/week. Will follow acutely to maximize independence and mobility prior to return home.    Follow Up Recommendations Home health PT;Supervision for mobility/OOB    Equipment Recommendations  Rolling walker with 5" wheels    Recommendations for Other Services OT consult     Precautions / Restrictions Precautions Precautions: Fall Required Braces or Orthoses: Other Brace/Splint Other Brace/Splint: CAM boot Restrictions Weight Bearing Restrictions: Yes RLE Weight Bearing: Weight bearing as tolerated Other Position/Activity Restrictions: in CAM boot      Mobility  Bed Mobility Overal bed mobility: Needs Assistance Bed Mobility: Supine to Sit;Sit to Supine     Supine to sit: Modified independent (Device/Increase time);HOB elevated Sit to supine: Modified independent (Device/Increase time);HOB elevated   General bed mobility comments: Use of rail. No assist needed.   Transfers Overall transfer level: Needs assistance Equipment used: Rolling walker (2 wheeled) Transfers: Sit to/from Stand Sit to Stand: Min guard         General transfer comment: Min guard for safety. Stood from [employer], from toilet x1, from chair x1.    Ambulation/Gait Ambulation/Gait assistance: Min guard Ambulation Distance (Feet): 150 Feet Assistive device: Rolling walker (2 wheeled) Gait Pattern/deviations: Step-to pattern;Step-through pattern;Decreased stride length;Decreased stance time - right;Decreased step length - left;Trunk flexed;Antalgic   Gait velocity interpretation: <1.8 ft/sec, indicative of risk for recurrent falls General Gait Details: Cues to decrease step length on right and to keep RW on floor. 1 seated rest break due to fatigue/weakness.  Stairs            Wheelchair Mobility    Modified Rankin (Stroke Patients Only)       Balance Overall balance assessment: Needs assistance Sitting-balance support: Feet supported;No upper extremity supported Sitting balance-Leahy Scale: Good Sitting balance - Comments: Able to reach outside BoS and donn socks and donn shoe, assist to doff cam boot   Standing balance support: During functional activity Standing balance-Leahy Scale: Fair Standing balance comment: Able to stand at sink and wash hands without difficulty.                              Pertinent Vitals/Pain Pain Assessment: 0-10 Pain Score: 5  Pain Location: right foot Pain Descriptors / Indicators: Sore Pain Intervention(s): Monitored during session;Repositioned;Patient requesting pain meds-RN notified    Home Living Family/patient expects to be discharged to:: Private residence Living Arrangements: Spouse/significant other Available Help at Discharge: Family;Available PRN/intermittently Type of Home: House Home Access: Stairs to enter Entrance Stairs-Rails: None Entrance Stairs-Number of Steps: 5 Home Layout: Multi-level Home Equipment: None      Prior Function Level of Independence: Independent         Comments: retired     Higher education careers adviser Dominance   Dominant Hand: Right  Extremity/Trunk Assessment   Upper Extremity Assessment: Defer to OT evaluation            Lower Extremity Assessment: Generalized weakness         Communication   Communication: No difficulties  Cognition Arousal/Alertness: Awake/alert Behavior During Therapy: WFL for tasks assessed/performed Overall Cognitive Status: Within Functional Limits for tasks assessed                      General Comments      Exercises        Assessment/Plan    PT Assessment Patient needs continued PT services  PT Diagnosis Difficulty walking;Generalized weakness;Acute pain   PT Problem List Decreased strength;Pain;Impaired sensation;Decreased activity tolerance;Decreased balance;Decreased mobility;Decreased knowledge of use of DME;Decreased knowledge of precautions  PT Treatment Interventions Balance training;Gait training;Stair training;Functional mobility training;Therapeutic activities;Therapeutic exercise;Patient/family education;DME instruction   PT Goals (Current goals can be found in the Care Plan section) Acute Rehab PT Goals Patient Stated Goal: to return to independence PT Goal Formulation: With patient Time For Goal Achievement: 02/16/16 Potential to Achieve Goals: Good    Frequency Min 3X/week   Barriers to discharge Inaccessible home environment stairs to enter home and to get to bedroom    Co-evaluation PT/OT/SLP Co-Evaluation/Treatment: Yes Reason for Co-Treatment: For patient/therapist safety PT goals addressed during session: Mobility/safety with mobility         End of Session Equipment Utilized During Treatment: Gait belt Activity Tolerance: Patient tolerated treatment well;Patient limited by fatigue Patient left: in bed;with call bell/phone within reach Nurse Communication: Mobility status         Time: [id number] PT Time Calculation (min) (ACUTE ONLY): 30 min   Charges:   PT Evaluation $PT Eval Moderate Complexity: 1 Procedure     PT G Codes:        Belenda Alviar A Raelea Gosse 02/02/2016, 3:03 PM [name] RedShauna Tariq Pernell, PT,  DPT [phone]

## 2016-02-02 NOTE — Progress Notes (Signed)
Inpatient Diabetes Program Recommendations  AACE/ADA: New Consensus Statement on Inpatient Glycemic Control (2015)  Target Ranges:  Prepandial:   less than 140 mg/dL      Peak postprandial:   less than 180 mg/dL (1-2 hours)      Critically ill patients:  140 - 180 mg/dL   Review of Glycemic Control  Inpatient Diabetes Program Recommendations:  Insulin - Basal: Increase Lantus to 25 units Thank you  Piedad ClimesGina Kalisa Girtman BSN, RN,CDE Inpatient Diabetes Coordinator [phone] (team pager)

## 2016-02-02 NOTE — Op Note (Signed)
NAMRolanda Lundborg:  Tippins, Antoney                ACCOUNT NO.:  [id number]  MEDICAL RECORD NO.:  [id number]  LOCATION:  2W03C                        FACILITY:  [facility]  PHYSICIAN:  Toni ArthursJohn Gracianna Vink, MD        DATE OF BIRTH:  1946-02-19  DATE OF PROCEDURE:  02/01/2016 DATE OF DISCHARGE:                              OPERATIVE REPORT   PREOPERATIVE DIAGNOSES: 1. Right forefoot gangrene. 2. Tight right heel cord.  POSTOPERATIVE DIAGNOSES: 1. Right forefoot gangrene. 2. Tight right heel cord.  PROCEDURE: 1. Right percutaneous tendo-Achilles lengthening through a separate     incision. 2. Right transmetatarsal amputation.  SURGEON:  Toni ArthursJohn Sonya Gunnoe, M.D.  ASSISTANT:  Alfredo MartinezJustin Ollis, PA-C.  ANESTHESIA:  General.  ESTIMATED BLOOD LOSS:  Minimal.  TOURNIQUET TIME:  18 minutes at 250 mmHg.  COMPLICATIONS:  None apparent.  DISPOSITION:  Extubated, awake, and stable to recovery.  INDICATION FOR PROCEDURE:  The patient is a 70 year old male who has a history of right forefoot gangrene.  He has been on IV antibiotics for the last 48 hours.  The area of infection has demarcated to include the hallux and second toe with involvement of a significant portion of his plantar skin.  He presents today for transmetatarsal amputation as well as heel cord lengthening.  He understands the risks and benefits, the alternative treatment options, and elects surgical treatment.  He specifically understands risks of bleeding, infection, nerve damage, blood clots, need for additional surgery, continued pain, revision amputation, and death.  PROCEDURE IN DETAIL:  After preoperative consent was obtained and the correct operative site was identified, the patient was brought to the operating room and placed supine on the operating table.  General anesthesia was induced.  Preoperative antibiotics were administered. Surgical time-out was taken.  The right lower extremity was prepped and draped in standard sterile fashion with a  tourniquet around the thigh. The extremity was exsanguinated and tourniquet was inflated to 250 mmHg. A #15 blade was then used to perform a percutaneous Achilles tendon lengthening.  This was done with 3 hemisections of the Achilles.  The ankle could then be dorsiflexed approximately 30 degrees with the knee extended.  Attention was then turned to the forefoot where a fishmouth incision was marked on the skin just proximal from the area of necrotic soft tissue. The incision was made.  Sharp dissection was carried down through the skin and subcutaneous tissue to the level of the metatarsals.  The dorsal soft tissues were elevated in subperiosteal fashion.  An oscillating saw was used to cut the metatarsals bevelling the cut plantarward.  The plantar soft tissues were then divided sharply and the forefoot was passed off the field.  The wound was then irrigated copiously.  The soft tissues appeared generally healthy.  They were irrigated copiously.  The flaps were contoured appropriately, and the cut surfaces of bone smoothed.  Wound was again irrigated. Neurovascular bundles were cauterized.  The incision was then closed with simple and horizontal mattress sutures of 2-0 nylon.  Sterile dressings were applied followed by a compression wrap.  The tourniquet was released at 18 minutes after application of the dressings.  The patient was awakened from anesthesia  and transported to recovery room in stable condition.  FOLLOWUP PLAN:  The patient will be weightbearing as tolerated on his right lower extremity in a CAM walker boot.  He will have Physical Therapy consultation.  He will follow up with me in the office in 2-3 weeks for suture removal.  Alfredo Martinez, PA-C, was present and scrubbed for the duration of the case.  His assistance was essential in positioning the patient, prepping and draping, gaining and maintaining exposure, performing the operation, and closing and dressing the  wounds.     Toni Arthurs, MD     JH/MEDQ  D:  02/01/2016  T:  02/02/2016  Job:  [id number]

## 2016-02-02 NOTE — Progress Notes (Signed)
[facility]                                                                              Patient Demographics  [name], is a 70 y.o. male, DOB - Sep 12, 1946, ZOX:[id number]  Admit date - 01/29/2016   Admitting Physician Ron ParkerHarvette C Jenkins, MD  Outpatient Primary MD for the patient is Theadora RamaHIGH,KEVIN P., MD  LOS - 4  days    Chief Complaint  Patient presents with  . Nail Problem       Brief HPI   [name] is a 70 y.o. male with a history of DM2, HTN presented to ED with worsening pain, redness and swelling of his right foot, blackening of his right great toe for the past 3 weeks. Patient was on 2 different antibiotics, Keflex and clindamycin.  He reported that 3 weeks ago, he hit his toe on the corner of his bed and this pulled the toenail off. The wound continued to worsen. He was on a cruise to [place] prior to admission and he was taken off the ship because he needed further medical evaluation. In the ED, he was evaluated and placed on IV Vancomycin and Zosyn, and Orthopedics was consulted.Patient was admitted for further workup.   Assessment & Plan    Active Problems:   Osteomyelitis (HCC) right great toe, cellulitis of the right foot - Right foot x-ray showed occlusion of much of the first phalanx, severe osteomyelitis, soft tissue air tracks about the first MTP joint, necrotizing fasciitis - Orthopedics Dr. Victorino DikeHewitt consulted - s/p transmetatarsal amputation,   - Stop IV vancomycin and Zosyn - CAM Boot and PT eval -home tomorrow if stable    Diabetes mellitus, type II uncontrolled - Uncontrolled, place on aggressive CBG control to aid in wound healing - continue Lantus to 18 units, Increased meal coverage 5units TID, continue sliding scale insulin  - Severely uncontrolled hemoglobin A1c 12.8    Hypertension - Currently stable, continue metoprolol, home dose of Lasix  GERD Continue PPI  Hyperlipidemia Continue statin  Anemia: Iron  deficiency - Microcytic, MCV 72,  Transfused 1 unit packed RBC, - d/w pt regarding need for outpatient GI workup/colonoscopy.  Constipation - continues laxatives and stool softeners, BM yetserday  Code Status: Full CODE STATUS  Family Communication: Discussed in detail with the patient, all imaging results, lab results explained to the patient   Disposition Plan: Home tomorrow  Time Spent in minutes 25 minutes  Procedures  Foot x-ray  Consults   Orthopedics  DVT Prophylaxis  placed on heparin subcutaneous  Medications  Scheduled Meds: . atorvastatin  40 mg Oral q1800  . bisacodyl  10 mg Rectal Once  . docusate sodium  100 mg Oral BID  . furosemide  40 mg Oral Daily  . heparin subcutaneous  5,000 Units Subcutaneous 3 times per day  . insulin aspart  0-15 Units Subcutaneous TID WC  . insulin aspart  0-5 Units Subcutaneous QHS  . insulin aspart  5 Units Subcutaneous TID WC  . insulin glargine  18 Units Subcutaneous QHS  . loratadine  10 mg Oral Daily  . metoprolol  50  mg Oral 3 times per day  . multivitamin with minerals  1 tablet Oral Daily  . pantoprazole  40 mg Oral Daily  . piperacillin-tazobactam (ZOSYN)  IV  3.375 g Intravenous Q8H  . vancomycin  1,250 mg Intravenous Q12H   Continuous Infusions: . lactated ringers 50 mL/hr at 02/01/16 1316   PRN Meds:.acetaminophen **OR** acetaminophen, acetaminophen **OR** acetaminophen, HYDROmorphone (DILAUDID) injection, metoCLOPramide **OR** metoCLOPramide (REGLAN) injection, ondansetron **OR** ondansetron (ZOFRAN) IV, oxyCODONE   Antibiotics   Anti-infectives    Start     Dose/Rate Route Frequency Ordered Stop   02/01/16 1458  vancomycin (VANCOCIN) powder  Status:  Discontinued       As needed 02/01/16 1458 02/01/16 1522   02/01/16 1300  ceFAZolin (ANCEF) IVPB 2g/100 mL premix  Status:  Discontinued     2 g 200 mL/hr over 30 Minutes Intravenous To ShortStay Surgical 01/31/16 1417 02/01/16 1610   01/30/16 1000   vancomycin (VANCOCIN) 1,250 mg in sodium chloride 0.9 % 250 mL IVPB     1,250 mg 166.7 mL/hr over 90 Minutes Intravenous Every 12 hours 01/29/16 2057     01/30/16 0600  piperacillin-tazobactam (ZOSYN) IVPB 3.375 g     3.375 g 12.5 mL/hr over 240 Minutes Intravenous Every 8 hours 01/29/16 2057     01/29/16 2100  vancomycin (VANCOCIN) 2,000 mg in sodium chloride 0.9 % 500 mL IVPB     2,000 mg 250 mL/hr over 120 Minutes Intravenous  Once 01/29/16 2055 01/30/16 0058   01/29/16 2030  piperacillin-tazobactam (ZOSYN) IVPB 3.375 g     3.375 g 100 mL/hr over 30 Minutes Intravenous  Once 01/29/16 2027 01/29/16 2328   01/29/16 2030  vancomycin (VANCOCIN) IVPB 1000 mg/200 mL premix  Status:  Discontinued     1,000 mg 200 mL/hr over 60 Minutes Intravenous  Once 01/29/16 2027 01/29/16 2055        Subjective:   Feels ok, had a BM, no pain, hasnt gotten OOB   Objective:   Filed Vitals:   02/01/16 1830 02/01/16 1932 02/01/16 2213 02/02/16 0438  BP: 119/62 122/64 122/67 131/69  Pulse: 86 85 82 78  Temp:   98.3 F (36.8 C) 99 F (37.2 C)  TempSrc:   Oral Oral  Resp:   18 18  Height:      Weight:      SpO2:  95% 95% 92%    Intake/Output Summary (Last 24 hours) at 02/02/16 0747 Last data filed at 02/02/16 0229  Gross per 24 hour  Intake    600 ml  Output    710 ml  Net   -110 ml     Wt Readings from Last 3 Encounters:  01/30/16 93.759 kg (206 lb 11.2 oz)     Exam  General: Alert and oriented x 3, NAD  HEENT:  PERRLA, EOMI  CVS: S1 S2clear, RRR  Respiratory: CTAB, no wheezing, rales or rhonchi  Abdomen: Soft, nontender, nondistended, + bowel sounds  Ext: no cyanosis clubbing or edema, right foot dressing intact  Neuro: No new deficits  Skin: No rashes  Psych: Normal affect and demeanor, alert and oriented x3    Data Reviewed:  I have personally reviewed following labs and imaging studies  Micro Results Recent Results (from the past 240 hour(s))  Blood Culture  (routine x 2)     Status: None (Preliminary result)   Collection Time: 01/29/16  9:51 PM  Result Value Ref Range Status   Specimen Description BLOOD RIGHT ARM  Final   Special Requests BOTTLES DRAWN AEROBIC AND ANAEROBIC 5CC EACH  Final   Culture NO GROWTH 3 DAYS  Final   Report Status PENDING  Incomplete  Blood Culture (routine x 2)     Status: None (Preliminary result)   Collection Time: 01/29/16 10:00 PM  Result Value Ref Range Status   Specimen Description BLOOD LEFT ARM  Final   Special Requests BOTTLES DRAWN AEROBIC AND ANAEROBIC 5CC EACH  Final   Culture NO GROWTH 3 DAYS  Final   Report Status PENDING  Incomplete  Surgical pcr screen     Status: None   Collection Time: 01/31/16 11:03 PM  Result Value Ref Range Status   MRSA, PCR NEGATIVE NEGATIVE Final   Staphylococcus aureus NEGATIVE NEGATIVE Final    Comment:        The Xpert SA Assay (FDA approved for NASAL specimens in patients over 35 years of age), is one component of a comprehensive surveillance program.  Test performance has been validated by [facility] for patients greater than or equal to 57 year old. It is not intended to diagnose infection nor to guide or monitor treatment.     Radiology Reports Dg Foot Complete Right  01/30/2016  CLINICAL DATA:  Acute onset of right foot infection. Initial encounter.1610 EXAM: RIGHT FOOT COMPLETE - 3+ VIEW COMPARISON:  None. FINDINGS: There is erosion of much of the first phalanx, with a moth-eaten appearance to the residual first phalanx. Surrounding soft tissue air tracks proximally about the first metatarsophalangeal joint. The appearance is suspicious for infection with a gas producing organism. Necrotizing fasciitis is a concern. No definite additional osseous erosions are characterized, though evaluation for osteomyelitis is limited on radiograph. Diffuse vascular calcifications are seen. Soft tissue swelling is noted about the ankle. Plantar and posterior calcaneal spurs  are seen. An os trigonum is noted. Small os naviculare fragments are seen. IMPRESSION: 1. Erosion of much of the first phalanx, with a moth-eaten appearance to the residual first phalanx, compatible with severe osteomyelitis. Surrounding soft tissue air tracks proximally about the first metatarsophalangeal joint. The appearance is suspicious for infection with a gas producing organism. Necrotizing fasciitis is a concern. 2. No definite additional osseous erosions seen, though evaluation for additional osteomyelitis is limited on radiograph. 3. Diffuse vascular calcifications seen. 4. Os trigonum noted.  Small os naviculare fragments seen. These results were called by telephone at the time of interpretation on 01/30/2016 at 12:32 am to Dr. Lovell Sheehan, who verbally acknowledged these results. Electronically Signed   By: Roanna Raider M.D.   On: 01/30/2016 00:36    CBC  Recent Labs Lab 01/29/16 1821 01/30/16 0320 01/31/16 0236 01/31/16 2127 02/01/16 0251 02/02/16 0432  WBC 11.1* 7.6 8.3  --  8.5 8.7  HGB 9.2* 8.0* 7.9* 8.8* 8.4* 8.4*  HCT 29.3* 25.2* 25.3* 28.8* 27.6* 27.3*  PLT 454* 364 339  --  347 333  MCV 72.7* 72.8* 74.0*  --  74.2* 76.0*  MCH 22.8* 23.1* 23.1*  --  22.6* 23.4*  MCHC 31.4 31.7 31.2  --  30.4 30.8  RDW 17.1* 17.1* 17.4*  --  17.6* 17.9*  LYMPHSABS 1.3  --   --   --   --   --   MONOABS 0.9  --   --   --   --   --   EOSABS 0.2  --   --   --   --   --   BASOSABS 0.0  --   --   --   --   --  Chemistries   Recent Labs Lab 01/29/16 1821 01/30/16 0320 01/31/16 0236 02/01/16 0251 02/02/16 0432  NA 133* 138 138 139 139  K 4.2 3.8 4.1 3.6 4.0  CL 99* 106 106 104 105  CO2 21* GLUCOSE 364* 205* 297* 274* 228*  BUN CREATININE 1.10 0.87 1.02 0.92 0.98  CALCIUM 9.6 8.8* 8.6* 8.6* 8.4*  AST 31  --   --   --   --   ALT 31  --   --   --   --   ALKPHOS 83  --   --   --   --   BILITOT 0.4  --   --   --   --     ------------------------------------------------------------------------------------------------------------------ estimated creatinine clearance is 79.3 mL/min (by C-G formula based on Cr of 0.98). ------------------------------------------------------------------------------------------------------------------ No results for input(s): HGBA1C in the last 72 hours. ------------------------------------------------------------------------------------------------------------------ No results for input(s): CHOL, HDL, LDLCALC, TRIG, CHOLHDL, LDLDIRECT in the last 72 hours. ------------------------------------------------------------------------------------------------------------------ No results for input(s): TSH, T4TOTAL, T3FREE, THYROIDAB in the last 72 hours.  Invalid input(s): FREET3 ------------------------------------------------------------------------------------------------------------------  Recent Labs  01/30/16 1319  VITAMINB12 425  FOLATE 30.1  FERRITIN 67  TIBC 272  IRON 13*  RETICCTPCT 1.6    Coagulation profile  Recent Labs Lab 01/30/16 0100  INR 1.34    No results for input(s): DDIMER in the last 72 hours.  Cardiac Enzymes No results for input(s): CKMB, TROPONINI, MYOGLOBIN in the last 168 hours.  Invalid input(s): CK ------------------------------------------------------------------------------------------------------------------ Invalid input(s): POCBNP   Recent Labs  01/31/16 2023 02/01/16 0632 02/01/16 1121 02/01/16 1526 02/01/16 2212 02/02/16 0609  GLUCAP 338* 249* 228* 205* 244* 239Zannie Cove M.D. [facility] 02/02/2016, 7:47 AM  Pager: [phone] Between 7am to 7pm - call Pager - [phone]  After 7pm go to [email or web address] - password [email or web address]  Call night coverage person covering after 7pm

## 2016-02-02 NOTE — Progress Notes (Signed)
Orthopedic Tech Progress Note Patient Details:  Jeraldine LootsFoye Brester [birth year] [id number]  Patient ID: Jeraldine LootsFoye Gaydos, male   DOB: [birth year], 70 y.o.   MRN: [id number] Called in bio-tech brace order; spoke with Anderson MaltaStephanie  Asharia Lotter 02/02/2016, 9:17 AM

## 2016-02-02 NOTE — Progress Notes (Signed)
Nutrition Consult/Brief Note  RD consulted for DM diet education.  Pt working with PT upon visit.  Spoke with pt's wife, Vernona RiegerLaura via telephone.  Wife requesting RD to return for education tomorrow AM.  Left "Carbohydrate Counting for People with Diabetes" handout from the [employer].  RD to return tomorrow, 4/6 at 1000.  Maureen ChattersKatie Lasonya Hubner, RD, LDN Pager #: [phone] After-Hours Pager #: [phone]

## 2016-02-02 NOTE — Progress Notes (Signed)
Subjective: 1 Day Post-Op Procedure(s) (LRB): RIGHT TRANSMETATARSAL AMPUTATION (Right)  Patient reports pain as mild to moderate.  Denies fever, chills, N/V.  Tolerating POs well.  [facility], but not since.  Objective:   VITALS:  Temp:  [97.8 F (36.6 C)-99 F (37.2 C)] 99 F (37.2 C) (04/05 0438) Pulse Rate:  [71-118] 78 (04/05 0438) Resp:  [13-23] 18 (04/05 0438) BP: (116-135)/(57-74) 131/69 mmHg (04/05 0438) SpO2:  [92 %-100 %] 92 % (04/05 0438) FiO2 (%):  [28 %] 28 % (04/04 1615)  General: WDWN patient in NAD. Psych:  Appropriate mood and affect. Neuro:  A&O x 3, Moving all extremities, sensation intact to light touch HEENT:  EOMs intact Chest:  Even non-labored respirations Skin: Dressing C/D/I, no rashes or lesions Extremities: warm/dry, no visible edema, erythmea, or echymosis.  No lymphadenopathy. Pulses: Popliteus 2+ MSK:  ROM: full knee ROM, MMT: patient can perform quad set, (-) Homan's    LABS  Recent Labs  01/31/16 0236 01/31/16 2127 02/01/16 0251 02/02/16 0432  HGB 7.9* 8.8* 8.4* 8.4*  WBC 8.3  --  8.5 8.7  PLT 339  --  347 333    Recent Labs  02/01/16 0251 02/02/16 0432  NA 139 139  K 3.6 4.0  CL 104 105  CO2 22 22  BUN 10 10  CREATININE 0.92 0.98  GLUCOSE 274* 228*   No results for input(s): LABPT, INR in the last 72 hours.   Assessment/Plan: 1 Day Post-Op Procedure(s) (LRB): RIGHT TRANSMETATARSAL AMPUTATION (Right)  Up with therapy  WBAT RLE in CAM boot. Plan for 2 week outpatient post-op visit with Dr. Victorino DikeHewitt.  Alfredo MartinezJustin Ginny Loomer, PA-C, ATC [facility] Office:  [phone]

## 2016-02-02 NOTE — Anesthesia Postprocedure Evaluation (Signed)
Anesthesia Post Note  Patient: [name]  Procedure(s) Performed: Procedure(s) (LRB): RIGHT TRANSMETATARSAL AMPUTATION (Right)  Patient location during evaluation: PACU Anesthesia Type: General Level of consciousness: awake and alert and patient cooperative Pain management: pain level controlled Vital Signs Assessment: post-procedure vital signs reviewed and stable Respiratory status: spontaneous breathing and respiratory function stable Cardiovascular status: stable Anesthetic complications: no    Last Vitals:  Filed Vitals:   02/01/16 2213 02/02/16 0438  BP: 122/67 131/69  Pulse: 82 78  Temp: 36.8 C 37.2 C  Resp: 18 18    Last Pain:  Filed Vitals:   02/02/16 0810  PainSc: 0-No pain                 [name] S

## 2016-02-02 NOTE — Evaluation (Signed)
Occupational Therapy Evaluation Patient Details Name: [name] MRN: [id number] DOB: 12/04/1945 Today's Date: 02/02/2016    History of Present Illness Pt is a 70 y/o male admitted with R forefoot gangrene. s/p transmetatarsal amputation and R tendo-Achilles lengthening. PMH: uncontrolled DM, HTN.   Clinical Impression   Pt is independent at his baseline. Presents with post operative pain, generalized weakness and impaired balance. Requiring min guard assistance for OOB mobility. Pt able to manage toileting from a comfort height and does not think a 3 in 1 will fit in his shower. Will follow acutely. Do not anticipate pt will need post acute OT.    Follow Up Recommendations  No OT follow up    Equipment Recommendations   (to be determined)    Recommendations for Other Services       Precautions / Restrictions Precautions Precautions: Fall Required Braces or Orthoses: Other Brace/Splint Other Brace/Splint: CAM boot Restrictions Weight Bearing Restrictions: Yes RLE Weight Bearing: Weight bearing as tolerated Other Position/Activity Restrictions: in CAM boot      Mobility Bed Mobility Overal bed mobility: Needs Assistance Bed Mobility: Supine to Sit;Sit to Supine       Transfers Overall transfer level: Needs assistance Equipment used: Rolling walker (2 wheeled) Transfers: Sit to/from Stand Sit to Stand: Min guard             Balance Overall balance assessment: Needs assistance Sitting-balance support: Feet supported;No upper extremity supported Sitting balance-Leahy Scale: Good Sitting balance - Comments: Able to reach outside BoS and donn socks and donn shoe, assist to doff cam boot   Standing balance support: During functional activity Standing balance-Leahy Scale: Fair Standing balance comment: Able to stand at sink and wash hands without difficulty.                             ADL Overall ADL's : Needs assistance/impaired Eating/Feeding:  Independent;Bed level   Grooming: Supervision/safety;Standing   Upper Body Bathing: Set up;Sitting   Lower Body Bathing: Supervison/ safety;Sit to/from stand   Upper Body Dressing : Set up;Sitting   Lower Body Dressing: Sit to/from stand;Supervision/safety   Toilet Transfer: Min guard;Ambulation;Comfort height toilet;RW;Grab bars   Toileting- Clothing Manipulation and Hygiene: Supervision/safety;Sit to/from stand       Functional mobility during ADLs: Min guard;Rolling walker General ADL Comments: Pt does not think a 3 in 1 will fit in his shower. Does not want to elevate his toilet.     Vision     Perception     Praxis      Pertinent Vitals/Pain Pain Assessment: 0-10 Pain Score: 5  Pain Location: right foot Pain Descriptors / Indicators: Sore Pain Intervention(s): Monitored during session;Repositioned;Patient requesting pain meds-RN notified     Hand Dominance Right   Extremity/Trunk Assessment Upper Extremity Assessment Upper Extremity Assessment: Defer to OT evaluation   Lower Extremity Assessment Lower Extremity Assessment: Generalized weakness       Communication Communication Communication: No difficulties   Cognition Arousal/Alertness: Awake/alert Behavior During Therapy: WFL for tasks assessed/performed Overall Cognitive Status: Within Functional Limits for tasks assessed                     General Comments       Exercises       Shoulder Instructions      Home Living Family/patient expects to be discharged to:: Private residence Living Arrangements: Spouse/significant other Available Help at Discharge: Family;Available PRN/intermittently Type of Home:  House Home Access: Stairs to enter Secretary/administratorntrance Stairs-Number of Steps: 5 Entrance Stairs-Rails: None Home Layout: Multi-level Alternate Level Stairs-Number of Steps: 7 up, 7 down Alternate Level Stairs-Rails: Right Bathroom Shower/Tub: Producer, television/film/videoWalk-in shower   Bathroom Toilet: Standard      Home Equipment: None          Prior Functioning/Environment Level of Independence: Independent        Comments: retired    OT Diagnosis: Generalized weakness;Acute pain   OT Problem List: Decreased strength;Decreased activity tolerance;Impaired balance (sitting and/or standing);Decreased knowledge of use of DME or AE;Pain   OT Treatment/Interventions: Self-care/ADL training;Patient/family education;Balance training;DME and/or AE instruction    OT Goals(Current goals can be found in the care plan section) Acute Rehab OT Goals Patient Stated Goal: to return to independence OT Goal Formulation: With patient Time For Goal Achievement: 02/09/16 Potential to Achieve Goals: Good ADL Goals Pt Will Perform Tub/Shower Transfer: Shower transfer;with supervision;rolling walker (determine need for seat) Additional ADL Goal #1: Pt will don and doff CAM boot independently.  OT Frequency: Min 2X/week   Barriers to D/C:            Co-evaluation   Reason for Co-Treatment: For patient/therapist safety PT goals addressed during session: Mobility/safety with mobility        End of Session Equipment Utilized During Treatment: Rolling walker  Activity Tolerance: Patient tolerated treatment well Patient left: in bed;with call bell/phone within reach   Time: 1240-1255 OT Time Calculation (min): 15 min Charges:  OT General Charges $OT Visit: 1 Procedure OT Evaluation $OT Eval Low Complexity: 1 Procedure G-Codes:    Evern BioMayberry, Awesome Jared Lynn 02/02/2016, 3:18 PM  [phone]

## 2016-02-03 ENCOUNTER — Other Ambulatory Visit: Payer: Self-pay | Admitting: *Deleted

## 2016-02-03 LAB — CULTURE, BLOOD (ROUTINE X 2)
Culture: NO GROWTH
Culture: NO GROWTH

## 2016-02-03 LAB — BASIC METABOLIC PANEL
ANION GAP: 12 (ref 5–15)
BUN: 9 mg/dL (ref 6–20)
CALCIUM: 8.6 mg/dL — AB (ref 8.9–10.3)
CHLORIDE: 101 mmol/L (ref 101–111)
CO2: 24 mmol/L (ref 22–32)
Creatinine, Ser: 1.05 mg/dL (ref 0.61–1.24)
GFR calc non Af Amer: >60 mL/min (ref >60)
Glucose, Bld: 214 mg/dL — ABNORMAL HIGH (ref 65–99)
Potassium: 3.9 mmol/L (ref 3.5–5.1)
Sodium: 137 mmol/L (ref 135–145)

## 2016-02-03 LAB — CBC
HEMATOCRIT: 29.6 % — AB (ref 39.0–52.0)
HEMOGLOBIN: 8.7 g/dL — AB (ref 13.0–17.0)
MCH: 22.4 pg — ABNORMAL LOW (ref 26.0–34.0)
MCHC: 29.4 g/dL — ABNORMAL LOW (ref 30.0–36.0)
MCV: 76.3 fL — ABNORMAL LOW (ref 78.0–100.0)
Platelets: 366 10*3/uL (ref 150–400)
RBC: 3.88 MIL/uL — AB (ref 4.22–5.81)
RDW: 18 % — ABNORMAL HIGH (ref 11.5–15.5)
WBC: 8.7 10*3/uL (ref 4.0–10.5)

## 2016-02-03 LAB — GLUCOSE, CAPILLARY
Glucose-Capillary: 212 mg/dL — ABNORMAL HIGH (ref 65–99)
Glucose-Capillary: 236 mg/dL — ABNORMAL HIGH (ref 65–99)

## 2016-02-03 MED ORDER — INSULIN GLARGINE 100 UNIT/ML SOLOSTAR PEN: 25.0000 [IU] | PEN_INJECTOR | Freq: Every day | SUBCUTANEOUS | Status: AC

## 2016-02-03 MED ORDER — OXYCODONE HCL 10 MG PO TABS: 5.0000 mg | ORAL_TABLET | ORAL | Status: DC | PRN

## 2016-02-03 MED ORDER — INSULIN STARTER KIT- PEN NEEDLES (ENGLISH)
1.0000 | Freq: Once | Status: AC
  Administered 2016-02-03: 1
  Filled 2016-02-03: qty 1

## 2016-02-03 MED ORDER — INSULIN GLARGINE 100 UNIT/ML ~~LOC~~ SOLN
25.0000 [IU] | Freq: Every day | SUBCUTANEOUS | Status: DC
Start: 2016-02-03 — End: 2016-02-03
  Filled 2016-02-03: qty 0.25

## 2016-02-03 MED ORDER — HYDROMORPHONE HCL 2 MG PO TABS: 2.0000 mg | ORAL_TABLET | ORAL | Status: AC | PRN

## 2016-02-03 MED ORDER — INSULIN ASPART 100 UNIT/ML ~~LOC~~ SOLN: 5.0000 [IU] | Freq: Three times a day (TID) | SUBCUTANEOUS | Status: DC

## 2016-02-03 MED ORDER — HYDROMORPHONE HCL 2 MG PO TABS
2.0000 mg | ORAL_TABLET | ORAL | Status: DC | PRN
  Administered 2016-02-03: 4 mg via ORAL
  Filled 2016-02-03: qty 2

## 2016-02-03 MED ORDER — POLYETHYLENE GLYCOL 3350 17 G PO PACK: 17.0000 g | PACK | Freq: Every day | ORAL | Status: DC

## 2016-02-03 MED ORDER — INSULIN ASPART 100 UNIT/ML FLEXPEN: 5.0000 [IU] | PEN_INJECTOR | Freq: Three times a day (TID) | SUBCUTANEOUS | Status: AC

## 2016-02-03 MED ORDER — DEXTROSE 50 % IV SOLN
INTRAVENOUS | Status: AC
  Filled 2016-02-03: qty 50

## 2016-02-03 MED ORDER — INSULIN GLARGINE 100 UNIT/ML ~~LOC~~ SOLN: 25.0000 [IU] | Freq: Every day | SUBCUTANEOUS | Status: DC

## 2016-02-03 MED ORDER — INSULIN PEN NEEDLE 31G X 5 MM MISC: 1.0000 | Freq: Three times a day (TID) | Status: AC

## 2016-02-03 NOTE — Consult Note (Signed)
   [facility]   02/03/2016  Jeraldine LootsFoye Quirion [birth year] [id number]   [facility] by inpatient RNCM. Inpatient RNCM indicates patient could use the additional [facility] follow up for DM management and teaching. She reports patient and wife had a lot of questions about DM management. Patient will also have home health as well with [facility] for PT. Went to bedside to speak with patient and wife about [facility]. Mrs. Junita PushRodgers indicates she has received literature in the mail about [facility]. States she was not sure what it was about but is now able to have a better understanding. Explained to her that [facility] will not interfere or replace services provided by home health. Written consent obtained. Confirmed Primary Care MD as Julieanne Mansonr.Kevin Howard with [facility]. Confirmed that Solmon IceLaura Lancour (wife) [phone] should be called for post transition of care calls. Explained that they will receive post transition of care calls and will be evaluated for monthly home visits. [facility] packet and contact information left at bedside. Made patient and wife aware that primary focus will be DM management and education. They are agreeable to this. Made inpatient RNCM aware. Made aware that discharge likely today.   Raiford NobleAtika Hall, MSN-Ed, RN,BSN [facility] Care [facility] Hospital Liaison [phone]

## 2016-02-03 NOTE — Progress Notes (Signed)
Pt has been discharged home with family. Pt's IV was removed with no complications. CCMD was notified and telemetry box was removed. Pt and pt's wife received discharge instructions and all questions were answered. Pt received insulin start kit. Pt was prescribed dilaudid PO and pt received dose prior to discharge, which pt stated was effective for pain management. Pt was discharged via wheelchair and was accompanied by his wife and a Chartered certified accountant. Pt was in no distress at time of discharge.   Grant Fontana RN, BSN

## 2016-02-03 NOTE — Progress Notes (Signed)
Physical Therapy Treatment Patient Details Name: [name] MRN: [id number] DOB: 09-May-1946 Today's Date: 02/03/2016    History of Present Illness Pt is a 70 y/o male admitted with R forefoot gangrene. s/p transmetatarsal amputation and R tendo-Achilles lengthening. PMH: uncontrolled DM, HTN.    PT Comments    Pt to d/c home today.  PTA educated and assisted patient with stair negotiation training.  Wife and daughter present and observed technique.  Wife performed technique for additional trial.  Pt and wife both demonstrated competency through demonstration and verbal recall.  Pt remains to fatigue easily and reports c/o pain in R calf.    Follow Up Recommendations  Home health PT;Supervision for mobility/OOB     Equipment Recommendations  Rolling walker with 5" wheels    Recommendations for Other Services OT consult     Precautions / Restrictions Precautions Precautions: Fall Required Braces or Orthoses: Other Brace/Splint Other Brace/Splint: CAM boot--must be worn for ambulation Restrictions Weight Bearing Restrictions: Yes RLE Weight Bearing: Weight bearing as tolerated Other Position/Activity Restrictions: in CAM boot    Mobility  Bed Mobility Overal bed mobility: Needs Assistance Bed Mobility: Sit to Supine     Supine to sit: Modified independent (Device/Increase time);HOB elevated Sit to supine: Modified independent (Device/Increase time);HOB elevated   General bed mobility comments: Use of rail. No assist needed.   Transfers Overall transfer level: Needs assistance Equipment used: Rolling walker (2 wheeled) Transfers: Sit to/from Stand Sit to Stand: Min assist         General transfer comment: Requires cues for hand placement, body position, forward weight shifting and assist for boosting to standing position.    Ambulation/Gait Ambulation/Gait assistance: Min guard Ambulation Distance (Feet): 200 Feet (x2 with brief rest break prior to stair  training.  )   Gait Pattern/deviations: Step-through pattern;Decreased stance time - right;Decreased dorsiflexion - left;Trunk flexed;Antalgic     General Gait Details: Cues for upper trunk control and postural awareness.  pt required cues for weight shifting and UE use on RW.     Stairs Stairs: Yes Stairs assistance: Min assist Stair Management: Backwards;No rails;Forwards Number of Stairs: 6 General stair comments: x3 with PTA, x3 with wife assisting with PTA for simulation at home.  Pt slow to ascend stair and required max VCs for safety with sequencing.  Pt performed backwards ascending and forwards descending.  Pt required cues for RW placement.  Pt and wife verbally recalled technique and demonstrated technique on second trial.  Pt report he feels competent with technique.    Wheelchair Mobility    Modified Rankin (Stroke Patients Only)       Balance Overall balance assessment: Needs assistance Sitting-balance support: Feet supported;No upper extremity supported Sitting balance-Leahy Scale: Good     Standing balance support: Bilateral upper extremity supported;During functional activity Standing balance-Leahy Scale: Fair Standing balance comment: Reliant on RW                    Cognition Arousal/Alertness: Awake/alert Behavior During Therapy: WFL for tasks assessed/performed Overall Cognitive Status: Within Functional Limits for tasks assessed                      Exercises      General Comments        Pertinent Vitals/Pain Pain Assessment: No/denies pain Pain Score: 4  Pain Location: R calf Pain Descriptors / Indicators: Sore (reports after adjustment of cam boot pain improved.  ) Pain Intervention(s): Monitored during session;Repositioned  Home Living                      Prior Function            PT Goals (current goals can now be found in the care plan section) Acute Rehab PT Goals Patient Stated Goal: to return to  independence Potential to Achieve Goals: Good Progress towards PT goals: Progressing toward goals    Frequency  Min 3X/week    PT Plan      Co-evaluation             End of Session Equipment Utilized During Treatment: Gait belt Activity Tolerance: Patient tolerated treatment well;Patient limited by fatigue Patient left: in bed;with call bell/phone within reach     Time: 1149-1227 PT Time Calculation (min) (ACUTE ONLY): 38 min  Charges:  $Gait Training: 23-37 mins $Therapeutic Activity: 8-22 mins                    G Codes:      Florestine Aversimee J Latishia Suitt 02/03/2016, 2:02 PM  Joycelyn RuaAimee Cassady Stanczak, PTA pager [phone]

## 2016-02-03 NOTE — Progress Notes (Signed)
Subjective: 2 Days Post-Op Procedure(s) (LRB): RIGHT TRANSMETATARSAL AMPUTATION (Right)  Attempted to round on patient today.  Was informed by staff that he was up walking in his CAM boot.  I circled the unit and could not find him.  Pleased to here that he is up ambulating in CAM as tolerated.   Objective:   VITALS:  Temp:  [99 F (37.2 C)-100.5 F (38.1 C)] 99.3 F (37.4 C) (04/06 0703) Pulse Rate:  [80-89] 85 (04/06 0703) Resp:  [16-18] 16 (04/06 0703) BP: (126-148)/(63-81) 148/81 mmHg (04/06 0703) SpO2:  [92 %-97 %] 92 % (04/06 0703)  Physical Exam:  N/A   LABS  Recent Labs  01/31/16 2127  02/01/16 0251 02/02/16 0432 02/03/16 0208  HGB 8.8*  --  8.4* 8.4* 8.7*  WBC  --   < > 8.5 8.7 8.7  PLT  --   < > 347 333 366  < > = values in this interval not displayed.  Recent Labs  02/02/16 0432 02/03/16 0208  NA 139 137  K 4.0 3.9  CL 105 101  CO2 22 24  BUN 10 9  CREATININE 0.98 1.05  GLUCOSE 228* 214*   No results for input(s): LABPT, INR in the last 72 hours.   Assessment/Plan: 2 Days Post-Op Procedure(s) (LRB): RIGHT TRANSMETATARSAL AMPUTATION (Right)  Up with therapy  WBAT RLE in CAM boot. Plan for 2 week outpatient post-op visit with Dr. Victorino DikeHewitt.  Alfredo MartinezJustin Daneka Lantigua, PA-C, ATC [facility] Office:  [phone]

## 2016-02-03 NOTE — Discharge Summary (Signed)
Physician Discharge Summary  [name] ZOX:[id number] DOB: 1945-12-26 DOA: 01/29/2016  PCP: [name], MD  Admit date: 01/29/2016 Discharge date: 02/03/2016  Time spent: 45 minutes  Recommendations for Outpatient Follow-up:  1. Dr.Hewitt in 2 weeks 2. PCP Dr.Howard in 1 week, newly started on Insulin, please monitor Log of CBGs   Discharge Diagnoses:    Right forefoot gangrene   Osteomyelitis (HCC)   Foot infection   Cellulitis and abscess of foot   Diabetes mellitus Uncontrolled   Hypertension   Iron deficiency anemia  Discharge Condition: stable  Diet recommendation: DM/heart healthy  Filed Weights   01/29/16 2000 01/30/16 0036  Weight: 97.523 kg (215 lb) 93.759 kg (206 lb 11.2 oz)    History of present illness:  Chief Complaint: Worsening Wound HPI: [name] is a 70 y.o. male with a history of DM2, HTN who presents to the ED with complaints of worsening pain and redness and swelling of his Right Foot with blackening of his Right great toe over the past 3 weeks despite 2 different antibiotics, Keflex then Clindamycin. He reports that 3 weeks ago he hit his toe on the corner of his bed and this pulled the toenail off. The wound continued to worsen  Hospital Course:  Osteomyelitis [facility]) right great toe, cellulitis of the right foot - Right foot x-ray showed occlusion of much of the first phalanx, severe osteomyelitis, soft tissue air tracks about the first MTP joint, necrotizing fasciitis - Orthopedics Dr. Victorino Dike consulted, felt to have forefoot gangrene and underwent Transmetatarsal amputation and Right percutaneous tendo-Achilles lengthening for Tight Right heel cord - Was initially on IV vancomycin and Zosyn, these Abx were stopped after amputation, remains afebrile and Blood Cx negatve - CAM Boot fitted and PT eval completed - FU with Dr.Hewitt in 2 weeks, Home health PT set up - pain control has been challenging in his case, exhibiting degree of narcotic  tolerance, finally discharged home on PO dilaudid after trying several other narcotics   Diabetes mellitus, type II uncontrolled - hemoglobin A1c 12.8 - Uncontrolled, placed on aggressive CBG control to aid in wound healing - started on lantus and meal coverage, now being discharged on  Lantus to 25 units, Increased meal coverage 5units TID, DM education completed, DM educator consulted, Insulin teaching completed per RN/DM coordinator   Hypertension - Currently stable, continue metoprolol, home dose of Lasix  GERD Continue PPI  Hyperlipidemia Continue statin  Anemia: Iron deficiency - Microcytic, MCV 72, Transfused 1 unit packed RBC, - d/w pt regarding need for outpatient GI workup/colonoscopy.  Constipation - continues laxatives and stool softeners, having BMs now  Procedures: Procedure(s):1. RIGHT TRANSMETATARSAL AMPUTATION 2. Right percutaneous achilles tendon lengthening  Consultations:  Ortho Dr.Hewitt  Discharge Exam: Filed Vitals:   02/02/16 2036 02/03/16 0703  BP: 132/71 148/81  Pulse: 89 85  Temp: 100.5 F (38.1 C) 99.3 F (37.4 C)  Resp: 18 16    General: AAOx3 Cardiovascular:S1S2/RRR Respiratory: CTAB  Discharge Instructions   Discharge Instructions    AMB Referral to [facility] Care Management    Complete by:  As directed   Please assign to [facility] RNCM for DM management and teaching. Written consent obtained. Please call wife for primary contact and transition of care- [name] at [phone]. To discharge home today 02/03/16. Will have [facility] for PT services. Please call with questions. Thanks. Raiford Noble, MSN-Ed, [facility] Liaison-[phone]  Reason for consult:  Please assign to [facility] RNCM  Diagnoses of:  Diabetes  Expected date of contact:  1-3 days (reserved for hospital discharges)     Ambulatory referral to Nutrition and Diabetic Education    Complete by:  As directed   A1C 12.8% on 01/30/16;  new to insulin Patient lives in [place] and would like to receive education in [place] if possible     Diet - low sodium heart healthy    Complete by:  As directed      Diet - low sodium heart healthy    Complete by:  As directed      Diet Carb Modified    Complete by:  As directed      Diet Carb Modified    Complete by:  As directed      Increase activity slowly    Complete by:  As directed      Increase activity slowly    Complete by:  As directed           Current Discharge Medication List    START taking these medications   Details  HYDROmorphone (DILAUDID) 2 MG tablet Take 1-2 tablets (2-4 mg total) by mouth every 4 (four) hours as needed for moderate pain or severe pain. Qty: 30 tablet, Refills: 0    insulin aspart (NOVOLOG FLEXPEN) 100 UNIT/ML FlexPen Inject 5 Units into the skin 3 (three) times daily with meals. Qty: 15 mL, Refills: 2    Insulin Glargine (LANTUS SOLOSTAR) 100 UNIT/ML Solostar Pen Inject 25 Units into the skin daily at 10 pm. Qty: 15 mL, Refills: 2    Insulin Pen Needle 31G X 5 MM MISC 1 each by Does not apply route 3 (three) times daily before meals. Qty: 50 each, Refills: 1    !! polyethylene glycol (MIRALAX / GLYCOLAX) packet Take 17 g by mouth daily. Qty: 14 each, Refills: 0    !! polyethylene glycol (MIRALAX / GLYCOLAX) packet Take 17 g by mouth daily. While taking pain meds Qty: 14 each, Refills: 0     !! - Potential duplicate medications found. Please discuss with provider.    CONTINUE these medications which have NOT CHANGED   Details  aspirin EC 81 MG tablet Take 81 mg by mouth daily.    furosemide (LASIX) 40 MG tablet Take 40 mg by mouth daily.    glipiZIDE (GLUCOTROL XL) 10 MG 24 hr tablet Take 10 mg by mouth 2 (two) times daily.    lisinopril (PRINIVIL,ZESTRIL) 10 MG tablet Take 10 mg by mouth daily.    loratadine (CLARITIN) 10 MG tablet Take 10 mg by mouth daily.    metFORMIN (GLUCOPHAGE) 1000 MG tablet Take 1,000 mg by mouth  2 (two) times daily with a meal.    metoprolol (LOPRESSOR) 50 MG tablet Take 50 mg by mouth 3 (three) times daily.    Multiple Vitamin (MULTIVITAMIN WITH MINERALS) TABS tablet Take 1 tablet by mouth daily.    omeprazole (PRILOSEC OTC) 20 MG tablet Take 20 mg by mouth daily.    simvastatin (ZOCOR) 80 MG tablet Take 80 mg by mouth daily at 6 PM.      STOP taking these medications     clindamycin (CLEOCIN) 300 MG capsule      diphenhydrAMINE (BENADRYL) 25 MG tablet      naproxen sodium (ANAPROX) 220 MG tablet      Phenylephrine-DM-GG-APAP (TYLENOL COLD MULTI-SYMPTOM) 5-10-200-325 MG TABS      cephALEXin (KEFLEX) 500 MG capsule        No Known Allergies Follow-up Information  Follow up with HEWITT, Jonny RuizJOHN, MD. Schedule an appointment as soon as possible for a visit in 2 weeks.   Specialty:  Orthopedic Surgery   Contact information:   [address] [place] [address] [phone]       Follow up with [facility].   Why:  HH-PT arranged- they will call you to arrange home visits   Contact information:   [address] [place] [address] [phone]       Follow up with [facility].   Why:  rolling walker arranged- to be deliverd to room prior to discharge   Contact information:   [address] [place] [address] [phone]       Follow up with Theadora RamaHIGH,KEVIN P., MD. Schedule an appointment as soon as possible for a visit in 1 week.   Specialty:  Infectious Diseases   Why:  please take log of Blood sugars to PCP   Contact information:   [address] [place] [address] [phone]        The results of significant diagnostics from this hospitalization (including imaging, microbiology, ancillary and laboratory) are listed below for reference.    Significant Diagnostic Studies: Dg Foot Complete Right  01/30/2016  CLINICAL DATA:  Acute onset of right foot infection. Initial  encounter.[id number] EXAM: RIGHT FOOT COMPLETE - 3+ VIEW COMPARISON:  None. FINDINGS: There is erosion of much of the first phalanx, with a moth-eaten appearance to the residual first phalanx. Surrounding soft tissue air tracks proximally about the first metatarsophalangeal joint. The appearance is suspicious for infection with a gas producing organism. Necrotizing fasciitis is a concern. No definite additional osseous erosions are characterized, though evaluation for osteomyelitis is limited on radiograph. Diffuse vascular calcifications are seen. Soft tissue swelling is noted about the ankle. Plantar and posterior calcaneal spurs are seen. An os trigonum is noted. Small os naviculare fragments are seen. IMPRESSION: 1. Erosion of much of the first phalanx, with a moth-eaten appearance to the residual first phalanx, compatible with severe osteomyelitis. Surrounding soft tissue air tracks proximally about the first metatarsophalangeal joint. The appearance is suspicious for infection with a gas producing organism. Necrotizing fasciitis is a concern. 2. No definite additional osseous erosions seen, though evaluation for additional osteomyelitis is limited on radiograph. 3. Diffuse vascular calcifications seen. 4. Os trigonum noted.  Small os naviculare fragments seen. These results were called by telephone at the time of interpretation on 01/30/2016 at 12:32 am to Dr. Lovell SheehanJenkins, who verbally acknowledged these results. Electronically Signed   By: Roanna RaiderJeffery  Chang M.D.   On: 01/30/2016 00:36    Microbiology: Recent Results (from the past 240 hour(s))  Blood Culture (routine x 2)     Status: None   Collection Time: 01/29/16  9:51 PM  Result Value Ref Range Status   Specimen Description BLOOD RIGHT ARM  Final   Special Requests BOTTLES DRAWN AEROBIC AND ANAEROBIC 5CC EACH  Final   Culture NO GROWTH 5 DAYS  Final   Report Status 02/03/2016 FINAL  Final  Blood Culture (routine x 2)     Status: None   Collection Time:  01/29/16 10:00 PM  Result Value Ref Range Status   Specimen Description BLOOD LEFT ARM  Final   Special Requests BOTTLES DRAWN AEROBIC AND ANAEROBIC 5CC EACH  Final   Culture NO GROWTH 5 DAYS  Final   Report Status 02/03/2016 FINAL  Final  Surgical pcr screen  Status: None   Collection Time: 01/31/16 11:03 PM  Result Value Ref Range Status   MRSA, PCR NEGATIVE NEGATIVE Final   Staphylococcus aureus NEGATIVE NEGATIVE Final    Comment:        The Xpert SA Assay (FDA approved for NASAL specimens in patients over 68 years of age), is one component of a comprehensive surveillance program.  Test performance has been validated by [facility] for patients greater than or equal to 53 year old. It is not intended to diagnose infection nor to guide or monitor treatment.      Labs: Basic Metabolic Panel:  Recent Labs Lab 01/30/16 0320 01/31/16 0236 02/01/16 0251 02/02/16 0432 02/03/16 0208  NA 138 138 139 139 137  K 3.8 4.1 3.6 4.0 3.9  CL 106 106 104 105 101  CO2 GLUCOSE 205* 297* 274* 228* 214*  BUN CREATININE 0.87 1.02 0.92 0.98 1.05  CALCIUM 8.8* 8.6* 8.6* 8.4* 8.6*   Liver Function Tests:  Recent Labs Lab 01/29/16 1821  AST 31  ALT 31  ALKPHOS 83  BILITOT 0.4  PROT 7.0  ALBUMIN 2.7*   No results for input(s): LIPASE, AMYLASE in the last 168 hours. No results for input(s): AMMONIA in the last 168 hours. CBC:  Recent Labs Lab 01/29/16 1821 01/30/16 0320 01/31/16 0236 01/31/16 2127 02/01/16 0251 02/02/16 0432 02/03/16 0208  WBC 11.1* 7.6 8.3  --  8.5 8.7 8.7  NEUTROABS 8.7*  --   --   --   --   --   --   HGB 9.2* 8.0* 7.9* 8.8* 8.4* 8.4* 8.7*  HCT 29.3* 25.2* 25.3* 28.8* 27.6* 27.3* 29.6*  MCV 72.7* 72.8* 74.0*  --  74.2* 76.0* 76.3*  PLT 454* 364 339  --  347 333 366   Cardiac Enzymes: No results for input(s): CKTOTAL, CKMB, CKMBINDEX, TROPONINI in the last 168 hours. BNP: BNP (last 3 results) No results for  input(s): BNP in the last 8760 hours.  ProBNP (last 3 results) No results for input(s): PROBNP in the last 8760 hours.  CBG:  Recent Labs Lab 02/02/16 1102 02/02/16 1618 02/02/16 2109 02/03/16 0628 02/03/16 1118  GLUCAP 214* 173* 209* 212* 236*       SignedZannie Cove MD.  [facility] 02/03/2016, 2:20 PM

## 2016-02-03 NOTE — Progress Notes (Addendum)
Inpatient Diabetes Program Recommendations  AACE/ADA: New Consensus Statement on Inpatient Glycemic Control (2015)  Target Ranges:  Prepandial:   less than 140 mg/dL      Peak postprandial:   less than 180 mg/dL (1-2 hours)      Critically ill patients:  140 - 180 mg/dL  Results for [name], [name] (MRN [id number]) as of 02/03/2016 09:24  Ref. Range 02/02/2016 06:09 02/02/2016 11:02 02/02/2016 16:18 02/02/2016 21:09 02/03/2016 06:28  Glucose-Capillary Latest Ref Range: 65-99 mg/dL 119239 (H) 147214 (H) 829173 (H) 209 (H) 212 (H)   Review of Glycemic Control  Current orders for Inpatient glycemic control: Lantus 18 units QHS, Novolog 0-15 units TID with meals, Novolog 0-5 units QHS, Novolog 5 units TID with meals for meal coverage  Inpatient Diabetes Program Recommendations Insulin - Basal: Please consider increasing Lantus to 23 units QHS (based on 93 kg x 0.25 units). Insulin - Meal Coverage: Please consider increasing Novolog meal coverage to 7 units TID with meals.  02/03/16@11 :33-Spoke with patient, his wife, and his daughter about diabetes and home regimen for diabetes control. Patient reports that he is followed by PCP for diabetes management. Discussed A1C results (12.8% on 01/30/16). Discussed glucose and A1C goals. Discussed importance of checking CBGs and maintaining good CBG control to prevent long-term and short-term complications. Explained how hyperglycemia leads to damage within blood vessels which lead to the common complications seen with uncontrolled diabetes. Stressed to the patient the importance of improving glycemic control to prevent further complications from uncontrolled diabetes especially to promote wound healing. Discussed impact of nutrition, exercise, stress, sickness, and medications on diabetes control. Discussed carbohydrates, carbohydrate goals per day and meal, along with portion sizes. Discussed currently ordered insulin regimen and informed patient that his discharge instructions will  specifically list insulin and dosages. Patient and his wife were previously educated by another diabetes coordinator on 01/31/16. Reinforced insulin pen education with patient, spouse, and patient's daughter. Reviewed all steps of insulin pen including attachment of needle, 2-unit air shot, dialing up dose, giving injection, removing needle, disposal of sharps, storage of unused insulin, disposal of insulin etc. Patient's wife able to provide successful return demonstration.  Encouraged patient to check his glucose 4 times per day (before meals and at bedtime) and to keep a log book of glucose readings and insulin taken which he will need to take to doctor appointments. Explained how his doctor can use the log book to continue to make insulin adjustments if needed. Informed patient that a referral for outpatient diabetes education will be placed and he would [place] lives in [place], [place]. Discussed Endocrin[place] in [place] (Dr. Fransico HimNida) and encouraged patient to talk with his PCP about being referred to an endocrinologist to improve diabetes control.  Patient verbalized understanding of information discussed and he states that he has no further questions at this time related to diabetes.  Thanks, Orlando PennerMarie Dennis Killilea, RN, MSN, CDE Diabetes Coordinator Inpatient Diabetes Program [phone] (Team Pager from 8am to 5pm) [phone] ([facility] office) [phone] [facility] office) [phone] [facility] office)

## 2016-02-03 NOTE — Progress Notes (Addendum)
Nutrition Consult/Follow Up  RD follow up for nutrition education regarding diabetes.   Lab Results  Component Value Date   HGBA1C 12.8* 01/30/2016    RD Discussed different food groups and their effects on blood sugar, emphasizing carbohydrate-containing foods. Provided list of carbohydrates and recommended serving sizes of common foods.  Discussed importance of controlled and consistent carbohydrate intake throughout the day. Provided examples of ways to balance meals/snacks and encouraged intake of high-fiber, whole grain complex carbohydrates.   Expect fair compliance.  Body mass index is 30.51 kg/(m^2). 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  Current diet order is Heart Healthy/Carbohydrate Modified.  Labs and medications reviewed. No further nutrition interventions warranted at this time. If additional nutrition issues arise, please re-consult RD.  Recommend outpatient referral for further nutrition education.  Maureen ChattersKatie Laretha Luepke, RD, LDN Pager #: [phone] After-Hours Pager #: [phone]

## 2016-02-03 NOTE — Progress Notes (Signed)
Occupational Therapy Treatment Patient Details Name: [name] MRN: [id number] DOB: 07/18/1946 Today's Date: 02/03/2016    History of present illness Pt is a 70 y/o male admitted with R forefoot gangrene. s/p transmetatarsal amputation and R tendo-Achilles lengthening. PMH: uncontrolled DM, HTN.   OT comments  This 70 yo male admitted with above presents to acute OT with all education completed. We will D/C from acute OT.  Follow Up Recommendations  No OT follow up    Equipment Recommendations  3 in 1 bedside comode       Precautions / Restrictions Precautions Precautions: Fall Required Braces or Orthoses: Other Brace/Splint Other Brace/Splint: CAM boot--must be worn for ambulation Restrictions Weight Bearing Restrictions: Yes RLE Weight Bearing: Weight bearing as tolerated Other Position/Activity Restrictions: in CAM boot       Mobility Bed Mobility Overal bed mobility: Needs Assistance Bed Mobility: Sit to Supine       Sit to supine: Modified independent (Device/Increase time);HOB elevated      Transfers Overall transfer level: Needs assistance Equipment used: Rolling walker (2 wheeled) Transfers: Sit to/from Stand Sit to Stand: Min assist (from chair with increased time to get weight forward)              Balance Overall balance assessment: Needs assistance Sitting-balance support: Feet supported;No upper extremity supported Sitting balance-Leahy Scale: Good     Standing balance support: Bilateral upper extremity supported;During functional activity Standing balance-Leahy Scale: Poor Standing balance comment: Reliant on RW                   ADL Overall ADL's : Needs assistance/impaired                                       General ADL Comments: In earlier this AM to work with pt and discovered that his cam boot straps were not long enough at the foot portion. Called [employer] to come and look at it. Back to see pt now. Wife  and daughter present. Educated wife on easiet clothing for LB--tried pants for today and they did work by removing cam boot, putting pants on, putting cam boot back on with pants bunched up above boot. The plan is that once home pt will wear shorts. Educated on needing some sort of seat for walk in shower--he decliined need for 3n1 to use as shower chair (however while typing this note wife reported that pt's daughter had talked him into it--which is a great idea for safety and he may also need it for over toilet due to he is having a difficult time for sit<>stand due to weak UEs). Did explain to pt and family that they would need to face the 3n1 towards the shower door and get a hand held shower to make transfer and process easier.  Pt and family aware that if pt gets in shower he needs to keep his right foot dry  (I recommended double bag/double tape or short leg cast cover). Had wife don Cam boot after getting RLE in pants so that we could see if she had any questions.                Cognition   Behavior During Therapy: WFL for tasks assessed/performed Overall Cognitive Status: Within Functional Limits for tasks assessed  Pertinent Vitals/ Pain       Pain Assessment: No/denies pain         Frequency Min 2X/week     Progress Toward Goals  OT Goals(current goals can now be found in the care plan section)  Progress towards OT goals:  (All education completed.)     Plan Discharge plan remains appropriate       End of Session Equipment Utilized During Treatment: Rolling walker (Cam boot RLE)   Activity Tolerance Patient tolerated treatment well   Patient Left in bed;with call bell/phone within reach;with family/visitor present   Nurse Communication  (Pt asking about something for a reddened place in groin area)        Time: 3244-0102 OT Time Calculation (min): 31 min  Charges: OT General Charges $OT Visit: 1 Procedure OT  Treatments $Self Care/Home Management : 23-37 mins  Evette Georges [phone] 02/03/2016, 1:27 PM

## 2016-02-03 NOTE — Care Management Note (Signed)
Case Management Note Donn PieriniKristi Trenisha Lafavor RN, BSN Unit 2W-Case Manager [phone]  Patient Details  Name: Jeraldine LootsFoye Prioleau MRN: [id number] Date of Birth: 11-30-1945  Subjective/Objective:       Pt admitted with foot infection/osteomylitis - s/p transmetatarsal amputation             Action/Plan: PTA pt lived at home with spouse- PT recommendations for HH-PT- orders have been placed- spoke with pt and wife at bedside- [facility] [place]- [facility]- [facility] [place]- per conversation pt will also need RW for home - DME order placed- [facility]- RW to be delivered to room prior to discharge.   Expected Discharge Date:    02/03/16              Expected Discharge Plan:  Home w Home Health Services  In-House Referral:     Discharge planning Services  CM Consult  Post Acute Care Choice:  Home Health, Durable Medical Equipment Choice offered to:  Patient, Spouse  DME Arranged:  Walker rolling DME Agency:  [facility]  HH Arr[facility]  [facility]  Status of Service:  Completed, signed off  Medicare Important Message Given:  Yes Date Medicare IM Given:    Medicare IM give by:    Date Additional Medicare IM Given:    Additional Medicare Important Message give by:     If discussed at Long Length of Stay Meetings, dates discussed:    Discharge Disposition: home/home health   Additional Comments:  Darrold SpanWebster, Fumie Fiallo Hall, RN 02/03/2016, 12:41 PM

## 2016-02-04 ENCOUNTER — Encounter: Payer: Self-pay | Admitting: *Deleted

## 2016-02-04 ENCOUNTER — Other Ambulatory Visit: Payer: Self-pay | Admitting: *Deleted

## 2016-02-04 DIAGNOSIS — Z89421 Acquired absence of other right toe(s): Secondary | ICD-10-CM | POA: Diagnosis not present

## 2016-02-04 DIAGNOSIS — Z4781 Encounter for orthopedic aftercare following surgical amputation: Secondary | ICD-10-CM | POA: Diagnosis not present

## 2016-02-04 DIAGNOSIS — Z794 Long term (current) use of insulin: Secondary | ICD-10-CM | POA: Diagnosis not present

## 2016-02-04 DIAGNOSIS — K219 Gastro-esophageal reflux disease without esophagitis: Secondary | ICD-10-CM | POA: Diagnosis not present

## 2016-02-04 DIAGNOSIS — E1165 Type 2 diabetes mellitus with hyperglycemia: Secondary | ICD-10-CM | POA: Diagnosis not present

## 2016-02-04 DIAGNOSIS — I10 Essential (primary) hypertension: Secondary | ICD-10-CM | POA: Diagnosis not present

## 2016-02-04 DIAGNOSIS — Z4889 Encounter for other specified surgical aftercare: Secondary | ICD-10-CM | POA: Diagnosis not present

## 2016-02-04 DIAGNOSIS — E785 Hyperlipidemia, unspecified: Secondary | ICD-10-CM | POA: Diagnosis not present

## 2016-02-04 DIAGNOSIS — Z5181 Encounter for therapeutic drug level monitoring: Secondary | ICD-10-CM | POA: Diagnosis not present

## 2016-02-04 DIAGNOSIS — D509 Iron deficiency anemia, unspecified: Secondary | ICD-10-CM | POA: Diagnosis not present

## 2016-02-04 NOTE — Patient Outreach (Signed)
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  RN CM reviewed medications with pt and pt reports he is taking all medications as prescribed.  RN CM reviewed signs/ symptoms infection, wife states she was taught to use insulin pen at hospital and is injecting pt as prescribed by MD.  CBG yesterday evening 302, fasting CBG today 260.  RN CM reviewed carbohydrate counting and pt verbalizes understanding of this, RN CM also reviewed plate method and wife states " I already know about the plate method".  RN CM faxed transition of care note and barrier letter to primary MD Dr. Dimas AguasHoward.  Outpatient Encounter Prescriptions as of 02/04/2016  Medication Sig Note  . aspirin EC 81 MG tablet Take 81 mg by mouth daily.   . furosemide (LASIX) 40 MG tablet Take 40 mg by mouth daily.   Marland Kitchen. glipiZIDE (GLUCOTROL XL) 10 MG 24 hr tablet Take 10 mg by mouth 2 (two) times daily.   Marland Kitchen. HYDROmorphone (DILAUDID) 2 MG tablet Take 1-2 tablets (2-4 mg total) by mouth every 4 (four) hours as needed for moderate pain or severe pain.   Marland Kitchen. insulin aspart (NOVOLOG FLEXPEN) 100 UNIT/ML FlexPen Inject 5 Units into the skin 3 (three) times daily with meals.   . Insulin Glargine (LANTUS SOLOSTAR) 100 UNIT/ML Solostar Pen Inject 25 Units into the skin daily at 10 pm.   . Insulin Pen Needle 31G X 5 MM MISC 1 each by Does not apply route 3 (three) times daily before meals.   Marland Kitchen. lisinopril (PRINIVIL,ZESTRIL) 10 MG tablet Take 10 mg by mouth daily.   Marland Kitchen. loratadine (CLARITIN) 10 MG tablet Take 10 mg by mouth daily.   . metFORMIN (GLUCOPHAGE) 1000 MG tablet Take 1,000 mg by mouth 2 (two) times daily with a meal.   . metoprolol (LOPRESSOR) 50 MG tablet Take 50 mg by mouth 3 (three) times daily.  01/29/2016: TID  . Multiple Vitamin (MULTIVITAMIN WITH MINERALS) TABS tablet Take 1 tablet by mouth daily.   Marland Kitchen. omeprazole (PRILOSEC OTC) 20 MG tablet Take 20 mg by mouth daily.   . polyethylene glycol (MIRALAX / GLYCOLAX) packet Take 17 g by mouth daily. While taking pain meds   . simvastatin (ZOCOR) 80 MG tablet Take 80 mg by mouth daily at 6 PM.   . [DISCONTINUED] polyethylene glycol (MIRALAX / GLYCOLAX) packet Take 17 g by mouth daily.    No facility-administered encounter medications on file as of 02/04/2016.   THN CM Care Plan Problem One        Most Recent Value   Care Plan Problem One  Knowledge deficit related to diabetes   Role Documenting the Problem One  Care Management Coordinator   Care Plan for Problem One  Active   THN Long Term Goal (31-90 days)  pt will have no hospital readmissions related to diabetes complications within 90 days   THN Long Term Goal Start Date  02/04/16   Interventions for Problem One Long Term Goal  RN CM spoke with [facility] PT Romeo AppleBen who reports RN services not ordered, RN CM ask that RN be included in orders due to pt is new on insulin, has elevated Hgb AIC, new post op surgical wound, Romeo AppleBen states he will add RN  to orders, RN CM called [facility], left voicemail for [facility] RN and reported pt does need RN for diabetic teaching, new post op incision and teaching.   THN CM Short Term Goal #1 (0-30 days)  [facility] RN will see pt for home visits within 7 days   THN CM Short Term Goal #1 Start Date  02/04/16   Interventions for Short Term Goal #1  RN CM coordinated care to have RN added to orders, see note under long term goal intervention.      Irving Shows [facility], BSN [facility] Community Care Coordinator [phone]

## 2016-02-07 DIAGNOSIS — Z4781 Encounter for orthopedic aftercare following surgical amputation: Secondary | ICD-10-CM | POA: Diagnosis not present

## 2016-02-07 DIAGNOSIS — D509 Iron deficiency anemia, unspecified: Secondary | ICD-10-CM | POA: Diagnosis not present

## 2016-02-07 DIAGNOSIS — E1165 Type 2 diabetes mellitus with hyperglycemia: Secondary | ICD-10-CM | POA: Diagnosis not present

## 2016-02-07 DIAGNOSIS — Z4889 Encounter for other specified surgical aftercare: Secondary | ICD-10-CM | POA: Diagnosis not present

## 2016-02-07 DIAGNOSIS — I10 Essential (primary) hypertension: Secondary | ICD-10-CM | POA: Diagnosis not present

## 2016-02-07 DIAGNOSIS — Z89421 Acquired absence of other right toe(s): Secondary | ICD-10-CM | POA: Diagnosis not present

## 2016-02-10 ENCOUNTER — Ambulatory Visit: Payer: Self-pay | Admitting: *Deleted

## 2016-02-10 ENCOUNTER — Other Ambulatory Visit: Payer: Self-pay | Admitting: *Deleted

## 2016-02-10 DIAGNOSIS — Z4889 Encounter for other specified surgical aftercare: Secondary | ICD-10-CM | POA: Diagnosis not present

## 2016-02-10 DIAGNOSIS — Z4781 Encounter for orthopedic aftercare following surgical amputation: Secondary | ICD-10-CM | POA: Diagnosis not present

## 2016-02-10 DIAGNOSIS — I739 Peripheral vascular disease, unspecified: Secondary | ICD-10-CM | POA: Diagnosis not present

## 2016-02-10 DIAGNOSIS — E1165 Type 2 diabetes mellitus with hyperglycemia: Secondary | ICD-10-CM | POA: Diagnosis not present

## 2016-02-10 DIAGNOSIS — Z89421 Acquired absence of other right toe(s): Secondary | ICD-10-CM | POA: Diagnosis not present

## 2016-02-10 DIAGNOSIS — E1159 Type 2 diabetes mellitus with other circulatory complications: Secondary | ICD-10-CM | POA: Diagnosis not present

## 2016-02-10 DIAGNOSIS — G4730 Sleep apnea, unspecified: Secondary | ICD-10-CM | POA: Diagnosis not present

## 2016-02-10 DIAGNOSIS — D509 Iron deficiency anemia, unspecified: Secondary | ICD-10-CM | POA: Diagnosis not present

## 2016-02-10 DIAGNOSIS — E1140 Type 2 diabetes mellitus with diabetic neuropathy, unspecified: Secondary | ICD-10-CM | POA: Diagnosis not present

## 2016-02-10 DIAGNOSIS — I10 Essential (primary) hypertension: Secondary | ICD-10-CM | POA: Diagnosis not present

## 2016-02-10 NOTE — Patient Outreach (Signed)
02/10/16- Telephone call to patient for transition of care week 2, unidentified male answered telephone and states Mr. [name] and his wife are not at home today.    PLAN See pt for scheduled initial home visit on 02/14/16  Irving ShowsJulie Farmer [facility], BSN [facility] Community [facility] Coordinator [phone]

## 2016-02-14 ENCOUNTER — Encounter: Payer: Self-pay | Admitting: *Deleted

## 2016-02-14 ENCOUNTER — Other Ambulatory Visit: Payer: Self-pay | Admitting: *Deleted

## 2016-02-14 VITALS — BP 118/64 | HR 70 | Resp 18 | Ht 69.0 in | Wt 200.0 lb

## 2016-02-14 DIAGNOSIS — Z794 Long term (current) use of insulin: Principal | ICD-10-CM

## 2016-02-14 DIAGNOSIS — D509 Iron deficiency anemia, unspecified: Secondary | ICD-10-CM | POA: Diagnosis not present

## 2016-02-14 DIAGNOSIS — Z4889 Encounter for other specified surgical aftercare: Secondary | ICD-10-CM | POA: Diagnosis not present

## 2016-02-14 DIAGNOSIS — E1165 Type 2 diabetes mellitus with hyperglycemia: Secondary | ICD-10-CM | POA: Diagnosis not present

## 2016-02-14 DIAGNOSIS — Z4781 Encounter for orthopedic aftercare following surgical amputation: Secondary | ICD-10-CM | POA: Diagnosis not present

## 2016-02-14 DIAGNOSIS — I10 Essential (primary) hypertension: Secondary | ICD-10-CM | POA: Diagnosis not present

## 2016-02-14 DIAGNOSIS — E118 Type 2 diabetes mellitus with unspecified complications: Secondary | ICD-10-CM

## 2016-02-14 DIAGNOSIS — Z89421 Acquired absence of other right toe(s): Secondary | ICD-10-CM | POA: Diagnosis not present

## 2016-02-14 NOTE — Addendum Note (Signed)
Addended by: Irving ShowsFARMER, Aasim Restivo A on: 02/14/2016 05:13 PM   Modules accepted: Medications

## 2016-02-14 NOTE — Patient Outreach (Addendum)
[facility]) Care Management   02/14/2016  [name] 1946/01/09 [id number]  [name] is an 70 y.o. 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  Wife states " I'm keeping excel spread sheet for blood sugars and I'm helping him count carbs"  Wife states " insulin pens cost [place] about 300$ month and we can afford but is there an alternative?"  Objective:   Filed Vitals:   02/14/16 1536  BP: 118/64  Pulse: 70  Resp: 18  Height: 1.753 m ('5\' 9"'$ )  Weight: 200 lb (90.719 kg)  SpO2: 99%  fasting CBG 180 Fasting ranges per log- 126-275 Random ranges per log 105-356 ROS  Physical Exam  Constitutional: He is oriented to person, place, and time. He appears well-developed and well-nourished.  HENT:  Head: Normocephalic.  Neck: Normal range of motion. Neck supple.  Cardiovascular: Normal rate and regular rhythm.   Respiratory: Effort normal and breath sounds normal.  GI: Soft. Bowel sounds are normal.  Musculoskeletal: Normal range of motion. He exhibits edema.  Dependent edema left lower extremity  Neurological: He is alert and oriented to person, place, and time.  Skin: Skin is warm and dry.  Pt has non removable dressing to right lower extremity and pt wearing boot.  Psychiatric: He has a normal mood and affect. His behavior is normal. Judgment and thought content normal.    Encounter Medications:   Outpatient Encounter Prescriptions as of 02/14/2016  Medication Sig Note  . aspirin EC 81 MG tablet Take 81 mg by mouth daily.   . furosemide (LASIX) 40 MG tablet Take 40 mg by mouth daily.   Marland Kitchen HYDROmorphone (DILAUDID) 2 MG tablet Take 1-2 tablets (2-4 mg total) by mouth every  4 (four) hours as needed for moderate pain or severe pain.   Marland Kitchen insulin aspart (NOVOLOG FLEXPEN) 100 UNIT/ML FlexPen Inject 5 Units into the skin 3 (three) times daily with meals.   . Insulin Glargine (LANTUS SOLOSTAR) 100 UNIT/ML Solostar Pen Inject 25 Units into the skin daily at 10 pm.   . Insulin Pen Needle 31G X 5 MM MISC 1 each by Does not apply route 3 (three) times daily before meals.   Marland Kitchen lisinopril (PRINIVIL,ZESTRIL) 10 MG tablet Take 10 mg by mouth daily.   Marland Kitchen loratadine (CLARITIN) 10 MG tablet Take 10 mg by mouth daily.   . metFORMIN (GLUCOPHAGE) 1000 MG tablet Take 1,000 mg by mouth 2 (two) times daily with a meal.   . metoprolol (LOPRESSOR) 50 MG tablet Take 50 mg by mouth 3 (three) times daily. 01/29/2016: TID  . Multiple Vitamin (MULTIVITAMIN WITH MINERALS) TABS tablet Take 1 tablet by mouth daily.   Marland Kitchen omeprazole (PRILOSEC OTC) 20 MG tablet Take 20 mg by mouth daily.   . simvastatin (ZOCOR) 80 MG tablet Take 80 mg by mouth daily at 6 PM.   . polyethylene glycol (MIRALAX / GLYCOLAX) packet Take 17 g by mouth daily. While taking pain meds (Patient not taking: Reported on 02/14/2016)   . [DISCONTINUED] glipiZIDE (GLUCOTROL XL) 10 MG 24 hr tablet Take 10 mg by mouth 2 (two) times daily.    No facility-administered encounter medications on file as of 02/14/2016.  Functional Status:   In your present state of health, do you have any difficulty performing the following activities: 02/14/2016 01/31/2016  Hearing? N -  Vision? N -  Difficulty concentrating or making decisions? N -  Walking or climbing stairs? Y -  Dressing or bathing? N -  Doing errands, shopping? N N  Preparing Food and eating ? N -  Using the Toilet? N -  In the past six months, have you accidently leaked urine? N -  Do you have problems with loss of bowel control? N -  Managing your Medications? Y -  Managing your Finances? N -  Housekeeping or managing your Housekeeping? Y -    Fall/Depression Screening:    PHQ  2/9 Scores 02/14/2016  PHQ - 2 Score 0   Fall Risk  02/14/2016 02/14/2016  Falls in the past year? No No    Assessment:  Pt saw Dr. Nadara Mustard last Thursday and glipizide is discontinued, has sliding scale. RN CM reviewed diabetic materials and [facility] calendar.  RN CM reviewed sliding scale and all medications with patient and wife.  RN CM faxed intial home visit and barrier letter to primary MD Dr. Nadara Mustard. RN CM sent In basket to [facility] pharmacist for suggestions related to insulin and costs.    THN CM Care Plan Problem One        Most Recent Value   Care Plan Problem One  Knowledge deficit related to diabetes   Role Documenting the Problem One  Care Management Coordinator   Care Plan for Problem One  Active   THN Long Term Goal (31-90 days)  pt will have no hospital readmissions related to diabetes complications within 90 days   THN Long Term Goal Start Date  02/04/16   Interventions for Problem One Long Term Goal  RN CM gave Diabetic booklet and reviewed with pt and wife, gave diabetic poster with pictures related to carb counting and reviewed all with pt and wife.   THN CM Short Term Goal #1 (0-30 days)  [facility] RN will see pt for home visits within 7 days   THN CM Short Term Goal #1 Start Date  02/04/16   [facility] CM Short Term Goal #1 Met Date  02/14/16   Interventions for Short Term Goal #1  RN CM verified that home health RN and PT is working with pt.   THN CM Short Term Goal #2 (0-30 days)  Patient will verbalize foods with high carbohydrate count within 30 days.   THN CM Short Term Goal #2 Start Date  02/14/16   Interventions for Short Term Goal #2  RN CM reviewed with pt and wife- carbohydrate counting, plate method, food choices and portion control.   THN CM Short Term Goal #3 (0-30 days)  Pt and wife will read and review diabetic handouts, booklet within 30 days.   THN CM Short Term Goal #3 Start Date  02/14/16   Interventions for Short Tern Goal #3  RN CM ask pt and wife to reviewe  futher in depth materials and handouts provided today.     Plan: follow up with home visit 03/14/16 Assess CBG log, diabetes teaching Collaborate with [facility] pharmacist as needed  Jacqlyn Larsen [facility], BSN [place] Coordinator [phone]

## 2016-02-16 DIAGNOSIS — Z89421 Acquired absence of other right toe(s): Secondary | ICD-10-CM | POA: Diagnosis not present

## 2016-02-16 DIAGNOSIS — Z89411 Acquired absence of right great toe: Secondary | ICD-10-CM | POA: Diagnosis not present

## 2016-02-16 DIAGNOSIS — Z4781 Encounter for orthopedic aftercare following surgical amputation: Secondary | ICD-10-CM | POA: Diagnosis not present

## 2016-02-17 DIAGNOSIS — Z4889 Encounter for other specified surgical aftercare: Secondary | ICD-10-CM | POA: Diagnosis not present

## 2016-02-17 DIAGNOSIS — Z4781 Encounter for orthopedic aftercare following surgical amputation: Secondary | ICD-10-CM | POA: Diagnosis not present

## 2016-02-17 DIAGNOSIS — I10 Essential (primary) hypertension: Secondary | ICD-10-CM | POA: Diagnosis not present

## 2016-02-17 DIAGNOSIS — D509 Iron deficiency anemia, unspecified: Secondary | ICD-10-CM | POA: Diagnosis not present

## 2016-02-17 DIAGNOSIS — Z89421 Acquired absence of other right toe(s): Secondary | ICD-10-CM | POA: Diagnosis not present

## 2016-02-17 DIAGNOSIS — E1165 Type 2 diabetes mellitus with hyperglycemia: Secondary | ICD-10-CM | POA: Diagnosis not present

## 2016-02-18 DIAGNOSIS — Z89421 Acquired absence of other right toe(s): Secondary | ICD-10-CM | POA: Diagnosis not present

## 2016-02-18 DIAGNOSIS — Z4781 Encounter for orthopedic aftercare following surgical amputation: Secondary | ICD-10-CM | POA: Diagnosis not present

## 2016-02-18 DIAGNOSIS — E1165 Type 2 diabetes mellitus with hyperglycemia: Secondary | ICD-10-CM | POA: Diagnosis not present

## 2016-02-18 DIAGNOSIS — Z4889 Encounter for other specified surgical aftercare: Secondary | ICD-10-CM | POA: Diagnosis not present

## 2016-02-18 DIAGNOSIS — I10 Essential (primary) hypertension: Secondary | ICD-10-CM | POA: Diagnosis not present

## 2016-02-18 DIAGNOSIS — D509 Iron deficiency anemia, unspecified: Secondary | ICD-10-CM | POA: Diagnosis not present

## 2016-02-21 ENCOUNTER — Other Ambulatory Visit: Payer: Self-pay | Admitting: *Deleted

## 2016-02-21 DIAGNOSIS — Z4889 Encounter for other specified surgical aftercare: Secondary | ICD-10-CM | POA: Diagnosis not present

## 2016-02-21 DIAGNOSIS — Z4781 Encounter for orthopedic aftercare following surgical amputation: Secondary | ICD-10-CM | POA: Diagnosis not present

## 2016-02-21 DIAGNOSIS — I10 Essential (primary) hypertension: Secondary | ICD-10-CM | POA: Diagnosis not present

## 2016-02-21 DIAGNOSIS — D509 Iron deficiency anemia, unspecified: Secondary | ICD-10-CM | POA: Diagnosis not present

## 2016-02-21 DIAGNOSIS — Z89421 Acquired absence of other right toe(s): Secondary | ICD-10-CM | POA: Diagnosis not present

## 2016-02-21 DIAGNOSIS — E1165 Type 2 diabetes mellitus with hyperglycemia: Secondary | ICD-10-CM | POA: Diagnosis not present

## 2016-02-21 NOTE — Patient Outreach (Signed)
02/21/16- Telephone call for transition of care week 4, spoke with wife Emir Nack, wife states pt saw surgeon and got "good report and now I'm changing dressing daily, it only has scant drainage",  home health RN and PT continue to see pt.  Wife states "blood sugar a little better, higher in morning sometime"  CBG in evening 101,79 and fasting today 287, pt to see primary care on 02/24/16 and will take CBG log. Wife states " he may see endocrinologist"   RN CM reviewed signs/ symptoms infection.  THN CM Care Plan Problem One        Most Recent Value   Care Plan Problem One  Knowledge deficit related to diabetes   Role Documenting the Problem One  Care Management Coordinator   Care Plan for Problem One  Active   THN Long Term Goal (31-90 days)  pt will have no hospital readmissions related to diabetes complications within 90 days   THN Long Term Goal Start Date  02/04/16   Interventions for Problem One Long Term Goal  RN CM reviewed carbohydrate counting, foods high in carbohydrates to limit/ avoid.  RN CM emailed [facility] diabetic educator Kelli Churn RN to inquire about suggestions related to patient's insulin and medication regime in relation to blood sugar readings.   THN CM Short Term Goal #1 (0-30 days)  [facility] RN will see pt for home visits within 7 days   THN CM Short Term Goal #1 Start Date  02/04/16   [facility] CM Short Term Goal #1 Met Date  02/14/16   THN CM Short Term Goal #2 (0-30 days)  Patient will verbalize foods with high carbohydrate count within 30 days.   THN CM Short Term Goal #2 Start Date  02/14/16   Interventions for Short Term Goal #2  RN CM reinforced with wife- carbohydrate counting, plate method, food choices and portion control.   THN CM Short Term Goal #3 (0-30 days)  Pt and wife will read and review diabetic handouts, booklet within 30 days.   THN CM Short Term Goal #3 Start Date  02/14/16   Interventions for Short Tern Goal #3  RN CM reminded wife to read through  diabetes booklet      PLAN Follow up with home visit in May  Jacqlyn Larsen [facility], [place] Coordinator [phone]

## 2016-02-23 DIAGNOSIS — D509 Iron deficiency anemia, unspecified: Secondary | ICD-10-CM | POA: Diagnosis not present

## 2016-02-23 DIAGNOSIS — Z89421 Acquired absence of other right toe(s): Secondary | ICD-10-CM | POA: Diagnosis not present

## 2016-02-23 DIAGNOSIS — I10 Essential (primary) hypertension: Secondary | ICD-10-CM | POA: Diagnosis not present

## 2016-02-23 DIAGNOSIS — E1165 Type 2 diabetes mellitus with hyperglycemia: Secondary | ICD-10-CM | POA: Diagnosis not present

## 2016-02-23 DIAGNOSIS — Z4781 Encounter for orthopedic aftercare following surgical amputation: Secondary | ICD-10-CM | POA: Diagnosis not present

## 2016-02-23 DIAGNOSIS — Z4889 Encounter for other specified surgical aftercare: Secondary | ICD-10-CM | POA: Diagnosis not present

## 2016-02-24 DIAGNOSIS — E1140 Type 2 diabetes mellitus with diabetic neuropathy, unspecified: Secondary | ICD-10-CM | POA: Diagnosis not present

## 2016-02-24 DIAGNOSIS — E1159 Type 2 diabetes mellitus with other circulatory complications: Secondary | ICD-10-CM | POA: Diagnosis not present

## 2016-02-24 DIAGNOSIS — E1165 Type 2 diabetes mellitus with hyperglycemia: Secondary | ICD-10-CM | POA: Diagnosis not present

## 2016-02-24 DIAGNOSIS — I739 Peripheral vascular disease, unspecified: Secondary | ICD-10-CM | POA: Diagnosis not present

## 2016-02-24 DIAGNOSIS — Z89411 Acquired absence of right great toe: Secondary | ICD-10-CM | POA: Diagnosis not present

## 2016-02-28 ENCOUNTER — Other Ambulatory Visit: Payer: Self-pay | Admitting: Pharmacist

## 2016-02-28 NOTE — Patient Outreach (Signed)
[facility]) Care Management  [facility]   02/28/2016  [name] 11/18/1945 [id number]  Subjective: [name] is a 69yo who was referred to [facility] for medication assistance.  Per referral, patient is able to afford insulin but says the Lantus and Novolog pen cost him approximately $300 per month.  Patient wants to know if there is a cheaper alternative or if there are any other suggestions.  Although they can afford, they feel $300 is too expensive.    Phone call to patient.  Verified name and date of birth.  Explained purpose of call and reviewed medications as well as discussed medication assistance with patient.  Patient reports adherence with all medications.  Patient reports his part D insurance plan is  [employer], and patient fills prescription mediations at [facility].  Patient reports he is able to afford all medications, but reports concern about price of Lantus and Novolog.     Objective:   Encounter Medications: Outpatient Encounter Prescriptions as of 02/28/2016  Medication Sig Note  . aspirin EC 81 MG tablet Take 81 mg by mouth daily.   . furosemide (LASIX) 40 MG tablet Take 40 mg by mouth daily.   . insulin aspart (NOVOLOG FLEXPEN) 100 UNIT/ML FlexPen Inject 5 Units into the skin 3 (three) times daily with meals. (Patient taking differently: Inject 5 Units into the skin 3 (three) times daily with meals. Sliding Scale 101-150 7 units 151-200 9 units 201-250 11 units 251-300 13 units 301-350 15 units Over 350  Call PCP)   . Insulin Glargine (LANTUS SOLOSTAR) 100 UNIT/ML Solostar Pen Inject 25 Units into the skin daily at 10 pm. (Patient taking differently: Inject 30 Units into the skin daily at 10 pm. )   . Insulin Pen Needle 31G X 5 MM MISC 1 each by Does not apply route 3 (three) times daily before meals.   Marland Kitchen lisinopril (PRINIVIL,ZESTRIL) 10 MG tablet Take 10 mg by mouth daily.   Marland Kitchen  loratadine (CLARITIN) 10 MG tablet Take 10 mg by mouth daily.   . metFORMIN (GLUCOPHAGE) 1000 MG tablet Take 1,000 mg by mouth 2 (two) times daily with a meal.   . metoprolol (LOPRESSOR) 50 MG tablet Take 50 mg by mouth 3 (three) times daily. 01/29/2016: TID  . Multiple Vitamin (MULTIVITAMIN WITH MINERALS) TABS tablet Take 1 tablet by mouth daily.   Marland Kitchen omeprazole (PRILOSEC OTC) 20 MG tablet Take 20 mg by mouth daily.   . simvastatin (ZOCOR) 80 MG tablet Take 80 mg by mouth daily at 6 PM.   . HYDROmorphone (DILAUDID) 2 MG tablet Take 1-2 tablets (2-4 mg total) by mouth every 4 (four) hours as needed for moderate pain or severe pain. (Patient not taking: Reported on 02/28/2016)   . polyethylene glycol (MIRALAX / GLYCOLAX) packet Take 17 g by mouth daily. While taking pain meds (Patient not taking: Reported on 02/14/2016)    No facility-administered encounter medications on file as of 02/28/2016.    Functional Status: In your present state of health, do you have any difficulty performing the following activities: 02/14/2016 01/31/2016  Hearing? N -  Vision? N -  Difficulty concentrating or making decisions? N -  Walking or climbing stairs? Y -  Dressing or bathing? N -  Doing errands, shopping? N N  Preparing Food and eating ? N -  Using the Toilet? N -  In the past six months, have you accidently leaked  urine? N -  Do you have problems with loss of bowel control? N -  Managing your Medications? Y -  Managing your Finances? N -  Housekeeping or managing your Housekeeping? Y -    Fall/Depression Screening: PHQ 2/9 Scores 02/14/2016  PHQ - 2 Score 0    Assessment:  Drugs sorted by system:  Cardiovascular: aspirin, furosemide, lisinopril, metoprolol tartrate, simvastatin  Pulmonary/Allergy: loratadine  Gastrointestinal: omeprazole, polyethylene glycol  Endocrine: Novolog, Lantus, metformin  Pain: hydromorphone  Vitamins/Minerals: multivitamin   Duplications in therapy: none  noted  Gaps in therapy: Based on the American Heart Association estimate 10-year risk for atherosclerotic cardiovascular disease (ASCVD), patient has a calculated 10-year risk of 35.3%.  Patient is currently on a moderate-intensity statin.  Based on this risk estimate, recommend a high intensity statin for this patient.    Medications to avoid in the elderly: omeprazole (risk of Clostridium difficile infection and bone loss and fractures)  Drug interactions: none noted  Other issues noted:  Medication cost:  Patient reports difficulty affording Lantus and Novolog.  With patient's permission, I called patient's insurance company and determined that both Lantus and Novolog are tier 1 under patient's insurance plan; however, patient is responsible for 30% of total prescription cost.  I spoke to a representative at [facility] order pharmacy and identified that medication cost for Lantus is the same for either pens or vials.  I identified that Novolog vials are $65 less than pens for a 90-day supply through mail order.  I informed patient of this information.  Patient did not appear interested in switching to mail order pharmacy at this time.    In addition, I discussed medication assistance options such as Extra Help and patient assistance programs.  Reviewed requirements for patient to qualify for Extra Help, and patient reports he would not qualify.  Also discussed requirements for Lantus and Novolog patient assistance programs.  Lantus patient assistance program requires patient to spend 5% of their annual income toward out-of-pocket on medications.  Patient reports he would not qualify for Lantus patient assistance.  Novolog patient assistance requires patients to spend $1000 out of pocket on medications before they qualify.  Patient reports he has only picked up a one month supply of insulin and would not qualify at this time.  Discussed use of Relion NPH and regular insulin from  [facility] to reduce mediation cost if patient's PCP approves use of alternative insulin products.  Patient reports he is interested if it helps reduce medication cost.  Patient has visit with PCP next week and plans to discuss alternative insulin products with his provider.         Plan: 1.  Based on patient's estimate 10-year risk for atherosclerotic cardiovascular disease (ASCVD) of 35.3%, will contact patient's PCP to recommend use of a high intensity statin, such as atorvastatin 40 or 80 mg daily rather than simvastatin.   2.  I will include in my communication to patient's PCP to consider a H2 blocker such as ranitidine in place of omperazole if clinically indicated as omeprazole may increase patient's risk of Clostridium difficile infection and bone loss and fractures.   3.  Patient reports difficulty affording Lantus and Novolog.  Reviewed medication assistance options, and patient reports he does not qualify for any assistance programs (Extra Help, Lantus or Novolog patient assistance programs).  Medication assistance options are limited.  Will send a letter to patient's provider to consider switching patient to NPH and regular insulin  to reduce medication cost for patient. 4.  Will close pharmacy program as no further pharmacy needs identified.  Provided patient with my contact information and encouraged him to call me if he has any questions or concerns regarding his medications in the future.  Advised patient to call me if he reaches out of pocket requirements for patient assistance program or if he goes into the donut hole and I can assist with patient assistance program applications.  Will update [facility] CMRN Rogelia MireJulia Farmer of case closure.    Lilla Shookachel Veronique Warga, Pharm.D. Pharmacy Resident [facility] [phone]

## 2016-02-29 ENCOUNTER — Encounter: Payer: Self-pay | Admitting: Pharmacist

## 2016-02-29 DIAGNOSIS — Z4889 Encounter for other specified surgical aftercare: Secondary | ICD-10-CM | POA: Diagnosis not present

## 2016-02-29 DIAGNOSIS — E1165 Type 2 diabetes mellitus with hyperglycemia: Secondary | ICD-10-CM | POA: Diagnosis not present

## 2016-02-29 DIAGNOSIS — I10 Essential (primary) hypertension: Secondary | ICD-10-CM | POA: Diagnosis not present

## 2016-02-29 DIAGNOSIS — Z4781 Encounter for orthopedic aftercare following surgical amputation: Secondary | ICD-10-CM | POA: Diagnosis not present

## 2016-02-29 DIAGNOSIS — Z89421 Acquired absence of other right toe(s): Secondary | ICD-10-CM | POA: Diagnosis not present

## 2016-02-29 DIAGNOSIS — D509 Iron deficiency anemia, unspecified: Secondary | ICD-10-CM | POA: Diagnosis not present

## 2016-03-06 ENCOUNTER — Ambulatory Visit: Payer: Medicare Other | Admitting: Nutrition

## 2016-03-06 DIAGNOSIS — Z4789 Encounter for other orthopedic aftercare: Secondary | ICD-10-CM | POA: Diagnosis not present

## 2016-03-09 DIAGNOSIS — E1165 Type 2 diabetes mellitus with hyperglycemia: Secondary | ICD-10-CM | POA: Diagnosis not present

## 2016-03-09 DIAGNOSIS — E1140 Type 2 diabetes mellitus with diabetic neuropathy, unspecified: Secondary | ICD-10-CM | POA: Diagnosis not present

## 2016-03-09 DIAGNOSIS — E669 Obesity, unspecified: Secondary | ICD-10-CM | POA: Diagnosis not present

## 2016-03-09 DIAGNOSIS — E1159 Type 2 diabetes mellitus with other circulatory complications: Secondary | ICD-10-CM | POA: Diagnosis not present

## 2016-03-10 DIAGNOSIS — D509 Iron deficiency anemia, unspecified: Secondary | ICD-10-CM | POA: Diagnosis not present

## 2016-03-10 DIAGNOSIS — Z4781 Encounter for orthopedic aftercare following surgical amputation: Secondary | ICD-10-CM | POA: Diagnosis not present

## 2016-03-10 DIAGNOSIS — Z4889 Encounter for other specified surgical aftercare: Secondary | ICD-10-CM | POA: Diagnosis not present

## 2016-03-10 DIAGNOSIS — I10 Essential (primary) hypertension: Secondary | ICD-10-CM | POA: Diagnosis not present

## 2016-03-10 DIAGNOSIS — Z89421 Acquired absence of other right toe(s): Secondary | ICD-10-CM | POA: Diagnosis not present

## 2016-03-10 DIAGNOSIS — E1165 Type 2 diabetes mellitus with hyperglycemia: Secondary | ICD-10-CM | POA: Diagnosis not present

## 2016-03-14 ENCOUNTER — Encounter: Payer: Self-pay | Admitting: *Deleted

## 2016-03-14 ENCOUNTER — Other Ambulatory Visit: Payer: Self-pay | Admitting: *Deleted

## 2016-03-14 NOTE — Patient Outreach (Addendum)
[place] [facility]) Care Management   03/14/2016  [name] 06-May-1946 [id number]  [name] is an 70 y.o. male  Subjective: Routine home visit with pt, wife present, HIPAA verified, pt states he saw surgeon last Thursday and primary care MD last Thursday, insulin increased, CBG readings improved "some", home health continues weekly with RN visits and assessment of wound to RLE, wife reports she is changing the dressing daily.  Pt states surgeon instructed him not to walk more than 600' at a time. Pt states he is "trying to elevate legs as much as I can".  Objective:   Filed Vitals:   03/14/16 1252  BP: 126/64  Pulse: 73  Resp: 18  SpO2: 98%   ROS  Physical Exam  Constitutional: He is oriented to person, place, and time. He appears well-developed and well-nourished.  HENT:  Head: Normocephalic.  Neck: Normal range of motion.  Cardiovascular: Normal rate and regular rhythm.   Respiratory: Effort normal and breath sounds normal.  GI: Soft. Bowel sounds are normal.  Musculoskeletal: Normal range of motion. He exhibits edema.  2+ edema left lower extremity Boot and dressing intact to right lower extremity  Neurological: He is alert and oriented to person, place, and time.  Skin: Skin is warm and dry.  Psychiatric: He has a normal mood and affect. His behavior is normal. Judgment and thought content normal.    Encounter Medications:   Outpatient Encounter Prescriptions as of 03/14/2016  Medication Sig Note  . aspirin EC 81 MG tablet Take 81 mg by mouth daily.   . furosemide (LASIX) 40 MG tablet Take 40 mg by mouth daily.   . Insulin Pen Needle 31G X 5 MM MISC 1 each by Does not apply route 3 (three) times daily before meals.   Marland Kitchen lisinopril (PRINIVIL,ZESTRIL) 10 MG tablet Take 10 mg by mouth daily.   Marland Kitchen loratadine (CLARITIN) 10 MG tablet Take 10 mg by mouth daily.   . metFORMIN (GLUCOPHAGE) 1000 MG tablet Take 1,000 mg by mouth 2 (two) times daily with a meal.    . metoprolol (LOPRESSOR) 50 MG tablet Take 50 mg by mouth 3 (three) times daily. 01/29/2016: TID  . Multiple Vitamin (MULTIVITAMIN WITH MINERALS) TABS tablet Take 1 tablet by mouth daily.   Marland Kitchen omeprazole (PRILOSEC OTC) 20 MG tablet Take 20 mg by mouth daily.   . simvastatin (ZOCOR) 80 MG tablet Take 80 mg by mouth daily at 6 PM.   . HYDROmorphone (DILAUDID) 2 MG tablet Take 1-2 tablets (2-4 mg total) by mouth every 4 (four) hours as needed for moderate pain or severe pain. (Patient not taking: Reported on 02/28/2016)   . insulin aspart (NOVOLOG FLEXPEN) 100 UNIT/ML FlexPen Inject 5 Units into the skin 3 (three) times daily with meals. (Patient taking differently: Inject 5 Units into the skin 3 (three) times daily with meals. Sliding Scale 101-150 7 units 151-200 9 units 201-250 11 units 251-300 13 units 301-350 15 units Over 350  Call PCP)   . Insulin Glargine (LANTUS SOLOSTAR) 100 UNIT/ML Solostar Pen Inject 25 Units into the skin daily at 10 pm. (Patient taking differently: Inject 30 Units into the skin daily at 10 pm. )   . polyethylene glycol (MIRALAX / GLYCOLAX) packet Take 17 g by mouth daily. While taking pain meds (Patient not taking: Reported on 02/14/2016)    No facility-administered encounter medications on file as of 03/14/2016.    Functional Status:   In your present state of health, do  you have any difficulty performing the following activities: 02/14/2016 01/31/2016  Hearing? N -  Vision? N -  Difficulty concentrating or making decisions? N -  Walking or climbing stairs? Y -  Dressing or bathing? N -  Doing errands, shopping? N N  Preparing Food and eating ? N -  Using the Toilet? N -  In the past six months, have you accidently leaked urine? N -  Do you have problems with loss of bowel control? N -  Managing your Medications? Y -  Managing your Finances? N -  Housekeeping or managing your Housekeeping? Y -    Fall/Depression Screening:    PHQ 2/9 Scores 02/14/2016  PHQ - 2  Score 0    Assessment:  Pt, wife keeping blood sugar readings in Excel spreadsheet, RN CM observed and fasting averages 126-291 and random averages 89-229, pt calculates average from 02/03/16 until today is 170 and there is improvement in CBG readings over the past week.  See care plan- RN CM focused on diabetes management and infection, wound healing.  RN CM reviewed EMMI handouts- DM-when you are sick,  DM care checklist,  DM diet-type 2, How DM type 2 can affect your body.  [facility]N CM Care Plan Problem One        Most Recent Value   Care Plan Problem One  Knowledge deficit related to diabetes   Role Documenting the Problem One  Care Management [place] for Problem One  Active   THN Long Term Goal (31-90 days)  pt will have no hospital readmissions related to diabetes complications within 90 days   THN Long Term Goal Start Date  02/04/16   Interventions for Problem One Long Term Goal  RN CM reinforced carbohydrate counting, importance of protein at each meal, importance of fiber and complex carbohydrates rather than simple carbohydrates.   THN CM Short Term Goal #1 Start Date  02/04/16   [facility]N CM Short Term Goal #2 (0-30 days)  Patient will verbalize foods with high carbohydrate count within 30 days.   THN CM Short Term Goal #2 Start Date  02/14/16   [facility] CM Short Term Goal #2 Met Date  03/14/16   Interventions for Short Term Goal #2  RN CM reviewed plate method with wife and pt   THN CM Short Term Goal #3 (0-30 days)  Pt and wife will read and review diabetic handouts, booklet within 30 days.   THN CM Short Term Goal #3 Start Date  02/14/16   [facility] CM Short Term Goal #3 Met Date  03/14/16 Barrie Folk met]   Interventions for Short Tern Goal #3  RN CM reminded wife and pt to use diabetic booklet as handout    [facility] CM Care Plan Problem Two        Most Recent Value   Care Plan Problem Two  Pt high risk for infection   Role Documenting the Problem Two  Care Management Coordinator   THN CM  Short Term Goal #1 (0-30 days)  pt will have progression with wound healing without infection within 30 days.   THN CM Short Term Goal #1 Start Date  03/14/16   Interventions for Short Term Goal #2   RN CM reviwed signs/ symptoms infection and reportable signs/ symptoms, importance of handwashing with daily wound care.      Plan: follow up with home visit 6/ 20/17 Assess CBG log Ask if home health continues, assess if wound to RLE is healed.  Jacqlyn Larsen [facility], [place] Coordinator [phone]

## 2016-03-15 DIAGNOSIS — Z4889 Encounter for other specified surgical aftercare: Secondary | ICD-10-CM | POA: Diagnosis not present

## 2016-03-15 DIAGNOSIS — Z89421 Acquired absence of other right toe(s): Secondary | ICD-10-CM | POA: Diagnosis not present

## 2016-03-15 DIAGNOSIS — E1165 Type 2 diabetes mellitus with hyperglycemia: Secondary | ICD-10-CM | POA: Diagnosis not present

## 2016-03-15 DIAGNOSIS — Z4781 Encounter for orthopedic aftercare following surgical amputation: Secondary | ICD-10-CM | POA: Diagnosis not present

## 2016-03-15 DIAGNOSIS — I10 Essential (primary) hypertension: Secondary | ICD-10-CM | POA: Diagnosis not present

## 2016-03-15 DIAGNOSIS — D509 Iron deficiency anemia, unspecified: Secondary | ICD-10-CM | POA: Diagnosis not present

## 2016-03-20 DIAGNOSIS — Z4781 Encounter for orthopedic aftercare following surgical amputation: Secondary | ICD-10-CM | POA: Diagnosis not present

## 2016-03-20 DIAGNOSIS — Z89421 Acquired absence of other right toe(s): Secondary | ICD-10-CM | POA: Diagnosis not present

## 2016-03-20 DIAGNOSIS — Z89411 Acquired absence of right great toe: Secondary | ICD-10-CM | POA: Diagnosis not present

## 2016-03-22 DIAGNOSIS — Z4781 Encounter for orthopedic aftercare following surgical amputation: Secondary | ICD-10-CM | POA: Diagnosis not present

## 2016-03-22 DIAGNOSIS — Z89421 Acquired absence of other right toe(s): Secondary | ICD-10-CM | POA: Diagnosis not present

## 2016-03-22 DIAGNOSIS — Z4889 Encounter for other specified surgical aftercare: Secondary | ICD-10-CM | POA: Diagnosis not present

## 2016-03-22 DIAGNOSIS — E1165 Type 2 diabetes mellitus with hyperglycemia: Secondary | ICD-10-CM | POA: Diagnosis not present

## 2016-03-22 DIAGNOSIS — D509 Iron deficiency anemia, unspecified: Secondary | ICD-10-CM | POA: Diagnosis not present

## 2016-03-22 DIAGNOSIS — I10 Essential (primary) hypertension: Secondary | ICD-10-CM | POA: Diagnosis not present

## 2016-03-23 DIAGNOSIS — E782 Mixed hyperlipidemia: Secondary | ICD-10-CM | POA: Diagnosis not present

## 2016-03-23 DIAGNOSIS — E1159 Type 2 diabetes mellitus with other circulatory complications: Secondary | ICD-10-CM | POA: Diagnosis not present

## 2016-03-23 DIAGNOSIS — I739 Peripheral vascular disease, unspecified: Secondary | ICD-10-CM | POA: Diagnosis not present

## 2016-03-23 DIAGNOSIS — E1140 Type 2 diabetes mellitus with diabetic neuropathy, unspecified: Secondary | ICD-10-CM | POA: Diagnosis not present

## 2016-03-30 DIAGNOSIS — I10 Essential (primary) hypertension: Secondary | ICD-10-CM | POA: Diagnosis not present

## 2016-03-30 DIAGNOSIS — D509 Iron deficiency anemia, unspecified: Secondary | ICD-10-CM | POA: Diagnosis not present

## 2016-03-30 DIAGNOSIS — Z4889 Encounter for other specified surgical aftercare: Secondary | ICD-10-CM | POA: Diagnosis not present

## 2016-03-30 DIAGNOSIS — Z4781 Encounter for orthopedic aftercare following surgical amputation: Secondary | ICD-10-CM | POA: Diagnosis not present

## 2016-03-30 DIAGNOSIS — E1165 Type 2 diabetes mellitus with hyperglycemia: Secondary | ICD-10-CM | POA: Diagnosis not present

## 2016-03-30 DIAGNOSIS — Z89421 Acquired absence of other right toe(s): Secondary | ICD-10-CM | POA: Diagnosis not present

## 2016-04-03 DIAGNOSIS — Z4789 Encounter for other orthopedic aftercare: Secondary | ICD-10-CM | POA: Diagnosis not present

## 2016-04-04 DIAGNOSIS — E785 Hyperlipidemia, unspecified: Secondary | ICD-10-CM | POA: Diagnosis not present

## 2016-04-04 DIAGNOSIS — K219 Gastro-esophageal reflux disease without esophagitis: Secondary | ICD-10-CM | POA: Diagnosis not present

## 2016-04-04 DIAGNOSIS — E1165 Type 2 diabetes mellitus with hyperglycemia: Secondary | ICD-10-CM | POA: Diagnosis not present

## 2016-04-04 DIAGNOSIS — D509 Iron deficiency anemia, unspecified: Secondary | ICD-10-CM | POA: Diagnosis not present

## 2016-04-04 DIAGNOSIS — I10 Essential (primary) hypertension: Secondary | ICD-10-CM | POA: Diagnosis not present

## 2016-04-04 DIAGNOSIS — Z794 Long term (current) use of insulin: Secondary | ICD-10-CM | POA: Diagnosis not present

## 2016-04-04 DIAGNOSIS — Z5181 Encounter for therapeutic drug level monitoring: Secondary | ICD-10-CM | POA: Diagnosis not present

## 2016-04-04 DIAGNOSIS — Z4889 Encounter for other specified surgical aftercare: Secondary | ICD-10-CM | POA: Diagnosis not present

## 2016-04-04 DIAGNOSIS — Z89421 Acquired absence of other right toe(s): Secondary | ICD-10-CM | POA: Diagnosis not present

## 2016-04-04 DIAGNOSIS — Z4781 Encounter for orthopedic aftercare following surgical amputation: Secondary | ICD-10-CM | POA: Diagnosis not present

## 2016-04-18 ENCOUNTER — Encounter: Payer: Self-pay | Admitting: *Deleted

## 2016-04-18 ENCOUNTER — Other Ambulatory Visit: Payer: Self-pay | Admitting: *Deleted

## 2016-04-18 NOTE — Patient Outreach (Signed)
[place] [facility]) Care Management   04/18/2016  [name] [birth year] [id number]  [name] is an 70 y.o. male  Subjective: Initial home visit with pt, HIPAA verified, wife present, pt states he is keeping detailed log of blood sugar in excel spreadsheet and app on his phone and says " blood sugar really improved"  Pt states he has had one low reading but not sure if glucometer "was off or something"  pt states he may see endocrinologist, pt states " It hurts my fingers to prick them so much"  Pt states he follows up with surgeon frequently and to see surgeon next Monday, still can only walk 600' per MD order.    Objective:   Filed Vitals:   04/18/16 1741  BP: 118/64  Pulse: 72  Resp: 16  SpO2: 98%  Fasting CBG today 127 30 day range 121 90 day range 152 ROS  Physical Exam  Constitutional: He is oriented to person, place, and time. He appears well-developed and well-nourished.  HENT:  Head: Normocephalic.  Neck: Normal range of motion. Neck supple.  Cardiovascular: Normal rate and regular rhythm.   Respiratory: Effort normal and breath sounds normal.  GI: Soft. Bowel sounds are normal.  Musculoskeletal: Normal range of motion. He exhibits edema.  2+ edema lower extremities bil  Neurological: He is alert and oriented to person, place, and time.  Skin: Skin is warm and dry.  Pt wearing boot to left lower extremity, home health assisting with wound care.  Psychiatric: He has a normal mood and affect. His behavior is normal. Judgment and thought content normal.    Encounter Medications:   Outpatient Encounter Prescriptions as of 04/18/2016  Medication Sig Note  . aspirin EC 81 MG tablet Take 81 mg by mouth daily.   . furosemide (LASIX) 40 MG tablet Take 40 mg by mouth daily.   . insulin aspart (NOVOLOG FLEXPEN) 100 UNIT/ML FlexPen Inject 5 Units into the skin 3 (three) times daily with meals. (Patient taking differently: Inject 5 Units into the skin 3 (three)  times daily with meals. Sliding Scale 100 or below 12 units 101-150 14 units 151-200 16 units 201-250 18 units 251-300 20 units 301-350 22 units Over 350 call MD Over 350  Call PCP)   . Insulin Glargine (LANTUS SOLOSTAR) 100 UNIT/ML Solostar Pen Inject 25 Units into the skin daily at 10 pm. (Patient taking differently: Inject 40 Units into the skin daily at 10 pm. )   . Insulin Pen Needle 31G X 5 MM MISC 1 each by Does not apply route 3 (three) times daily before meals.   Marland Kitchen lisinopril (PRINIVIL,ZESTRIL) 10 MG tablet Take 10 mg by mouth daily.   Marland Kitchen loratadine (CLARITIN) 10 MG tablet Take 10 mg by mouth daily.   . metFORMIN (GLUCOPHAGE) 1000 MG tablet Take 1,000 mg by mouth 2 (two) times daily with a meal.   . metoprolol (LOPRESSOR) 50 MG tablet Take 50 mg by mouth 3 (three) times daily. 01/29/2016: TID  . Multiple Vitamin (MULTIVITAMIN WITH MINERALS) TABS tablet Take 1 tablet by mouth daily.   Marland Kitchen omeprazole (PRILOSEC OTC) 20 MG tablet Take 20 mg by mouth daily.   . polyethylene glycol (MIRALAX / GLYCOLAX) packet Take 17 g by mouth daily. While taking pain meds   . simvastatin (ZOCOR) 80 MG tablet Take 80 mg by mouth daily at 6 PM.   . HYDROmorphone (DILAUDID) 2 MG tablet Take 1-2 tablets (2-4 mg total) by mouth every 4 (four)  hours as needed for moderate pain or severe pain. (Patient not taking: Reported on 02/28/2016)    No facility-administered encounter medications on file as of 04/18/2016.    Functional Status:   In your present state of health, do you have any difficulty performing the following activities: 02/14/2016 01/31/2016  Hearing? N -  Vision? N -  Difficulty concentrating or making decisions? N -  Walking or climbing stairs? Y -  Dressing or bathing? N -  Doing errands, shopping? N N  Preparing Food and eating ? N -  Using the Toilet? N -  In the past six months, have you accidently leaked urine? N -  Do you have problems with loss of bowel control? N -  Managing your  Medications? Y -  Managing your Finances? N -  Housekeeping or managing your Housekeeping? Y -    Fall/Depression Screening:    PHQ 2/9 Scores 02/14/2016  PHQ - 2 Score 0    Assessment: Patient's wife is going to research dexcom continuous CBG monitoring.  Patient's blood sugar has improved and pt realizes this is important for his overall health, pt has good support from his spouse, pt keeps excellent records and attends all MD appointments. According to pt, no medication changes.  Wound healing well to right foot and home health recertified pt for another 60 days.  Dressing intact to right foot, wife assists with wound care as well.  Pt refuses [facility] health coach. RN CM faxed case closure letter to primary MD Dr. Nadara Mustard and mailed case closure letter to pt.   [facility]N CM Care Plan Problem One        Most Recent Value   Care Plan Problem One  Knowledge deficit related to diabetes   Role Documenting the Problem One  Care Management Coordinator   Care Plan for Problem One  Active   THN Long Term Goal (31-90 days)  pt will have no hospital readmissions related to diabetes complications within 90 days   THN Long Term Goal Start Date  02/04/16   [facility] Long Term Goal Met Date  04/18/16   Interventions for Problem One Long Term Goal  RN CM reviewed how many carb choices allowed at each meal   THN CM Short Term Goal #1 Start Date  02/04/16   [facility] CM Short Term Goal #3 Met Date  -- [goal met]    [facility] CM Care Plan Problem Two        Most Recent Value   Care Plan Problem Two  Pt high risk for infection   Role Documenting the Problem Two  Care Management Coordinator   THN CM Short Term Goal #1 (0-30 days)  pt will have progression with wound healing without infection within 30 days.   THN CM Short Term Goal #1 Start Date  03/14/16   [facility] CM Short Term Goal #1 Met Date   04/18/16 Barrie Folk met- wound is healing and progressing ]   Interventions for Short Term Goal #2   RN CM reinforced signs/ symptoms infection  and reportable signs/ symptoms, importance of handwashing with daily wound care., ask pt to eat adequate protein at each meal      Plan: discharge today  Jacqlyn Larsen [facility], [place] Coordinator [phone]

## 2016-04-19 DIAGNOSIS — Z89421 Acquired absence of other right toe(s): Secondary | ICD-10-CM | POA: Diagnosis not present

## 2016-04-19 DIAGNOSIS — E1165 Type 2 diabetes mellitus with hyperglycemia: Secondary | ICD-10-CM | POA: Diagnosis not present

## 2016-04-19 DIAGNOSIS — I10 Essential (primary) hypertension: Secondary | ICD-10-CM | POA: Diagnosis not present

## 2016-04-19 DIAGNOSIS — D509 Iron deficiency anemia, unspecified: Secondary | ICD-10-CM | POA: Diagnosis not present

## 2016-04-19 DIAGNOSIS — Z4781 Encounter for orthopedic aftercare following surgical amputation: Secondary | ICD-10-CM | POA: Diagnosis not present

## 2016-04-19 DIAGNOSIS — Z4889 Encounter for other specified surgical aftercare: Secondary | ICD-10-CM | POA: Diagnosis not present

## 2016-04-20 DIAGNOSIS — D509 Iron deficiency anemia, unspecified: Secondary | ICD-10-CM | POA: Diagnosis not present

## 2016-04-20 DIAGNOSIS — I10 Essential (primary) hypertension: Secondary | ICD-10-CM | POA: Diagnosis not present

## 2016-04-20 DIAGNOSIS — E1165 Type 2 diabetes mellitus with hyperglycemia: Secondary | ICD-10-CM | POA: Diagnosis not present

## 2016-04-20 DIAGNOSIS — Z4889 Encounter for other specified surgical aftercare: Secondary | ICD-10-CM | POA: Diagnosis not present

## 2016-04-20 DIAGNOSIS — Z89421 Acquired absence of other right toe(s): Secondary | ICD-10-CM | POA: Diagnosis not present

## 2016-04-20 DIAGNOSIS — Z4781 Encounter for orthopedic aftercare following surgical amputation: Secondary | ICD-10-CM | POA: Diagnosis not present

## 2016-04-24 DIAGNOSIS — Z89411 Acquired absence of right great toe: Secondary | ICD-10-CM | POA: Diagnosis not present

## 2016-04-24 DIAGNOSIS — T8789 Other complications of amputation stump: Secondary | ICD-10-CM | POA: Diagnosis not present

## 2016-04-27 DIAGNOSIS — L97519 Non-pressure chronic ulcer of other part of right foot with unspecified severity: Secondary | ICD-10-CM | POA: Diagnosis not present

## 2016-04-27 DIAGNOSIS — Z79899 Other long term (current) drug therapy: Secondary | ICD-10-CM | POA: Diagnosis not present

## 2016-04-27 DIAGNOSIS — L97513 Non-pressure chronic ulcer of other part of right foot with necrosis of muscle: Secondary | ICD-10-CM | POA: Diagnosis not present

## 2016-04-27 DIAGNOSIS — T8131XA Disruption of external operation (surgical) wound, not elsewhere classified, initial encounter: Secondary | ICD-10-CM | POA: Diagnosis not present

## 2016-04-27 DIAGNOSIS — Z794 Long term (current) use of insulin: Secondary | ICD-10-CM | POA: Diagnosis not present

## 2016-04-27 DIAGNOSIS — Z7982 Long term (current) use of aspirin: Secondary | ICD-10-CM | POA: Diagnosis not present

## 2016-04-27 DIAGNOSIS — E785 Hyperlipidemia, unspecified: Secondary | ICD-10-CM | POA: Diagnosis not present

## 2016-04-27 DIAGNOSIS — I10 Essential (primary) hypertension: Secondary | ICD-10-CM | POA: Diagnosis not present

## 2016-04-27 DIAGNOSIS — E11621 Type 2 diabetes mellitus with foot ulcer: Secondary | ICD-10-CM | POA: Diagnosis not present

## 2016-04-28 DIAGNOSIS — I2510 Atherosclerotic heart disease of native coronary artery without angina pectoris: Secondary | ICD-10-CM | POA: Diagnosis not present

## 2016-04-28 DIAGNOSIS — I10 Essential (primary) hypertension: Secondary | ICD-10-CM | POA: Diagnosis not present

## 2016-04-28 DIAGNOSIS — E785 Hyperlipidemia, unspecified: Secondary | ICD-10-CM | POA: Diagnosis not present

## 2016-04-28 DIAGNOSIS — E11621 Type 2 diabetes mellitus with foot ulcer: Secondary | ICD-10-CM | POA: Diagnosis not present

## 2016-04-28 DIAGNOSIS — I771 Stricture of artery: Secondary | ICD-10-CM | POA: Diagnosis not present

## 2016-04-28 DIAGNOSIS — L97512 Non-pressure chronic ulcer of other part of right foot with fat layer exposed: Secondary | ICD-10-CM | POA: Diagnosis not present

## 2016-04-28 DIAGNOSIS — I743 Embolism and thrombosis of arteries of the lower extremities: Secondary | ICD-10-CM | POA: Diagnosis not present

## 2016-05-01 DIAGNOSIS — Z7984 Long term (current) use of oral hypoglycemic drugs: Secondary | ICD-10-CM | POA: Diagnosis not present

## 2016-05-01 DIAGNOSIS — H524 Presbyopia: Secondary | ICD-10-CM | POA: Diagnosis not present

## 2016-05-01 DIAGNOSIS — E119 Type 2 diabetes mellitus without complications: Secondary | ICD-10-CM | POA: Diagnosis not present

## 2016-05-01 DIAGNOSIS — Z794 Long term (current) use of insulin: Secondary | ICD-10-CM | POA: Diagnosis not present

## 2016-05-01 DIAGNOSIS — H5203 Hypermetropia, bilateral: Secondary | ICD-10-CM | POA: Diagnosis not present

## 2016-05-01 DIAGNOSIS — E1165 Type 2 diabetes mellitus with hyperglycemia: Secondary | ICD-10-CM | POA: Diagnosis not present

## 2016-05-01 DIAGNOSIS — H52223 Regular astigmatism, bilateral: Secondary | ICD-10-CM | POA: Diagnosis not present

## 2016-05-03 DIAGNOSIS — T8131XA Disruption of external operation (surgical) wound, not elsewhere classified, initial encounter: Secondary | ICD-10-CM | POA: Diagnosis not present

## 2016-05-03 DIAGNOSIS — L97519 Non-pressure chronic ulcer of other part of right foot with unspecified severity: Secondary | ICD-10-CM | POA: Diagnosis not present

## 2016-05-03 DIAGNOSIS — L97513 Non-pressure chronic ulcer of other part of right foot with necrosis of muscle: Secondary | ICD-10-CM | POA: Diagnosis not present

## 2016-05-03 DIAGNOSIS — E119 Type 2 diabetes mellitus without complications: Secondary | ICD-10-CM | POA: Diagnosis not present

## 2016-05-03 DIAGNOSIS — E11621 Type 2 diabetes mellitus with foot ulcer: Secondary | ICD-10-CM | POA: Diagnosis not present

## 2016-05-04 DIAGNOSIS — Z4889 Encounter for other specified surgical aftercare: Secondary | ICD-10-CM | POA: Diagnosis not present

## 2016-05-04 DIAGNOSIS — D509 Iron deficiency anemia, unspecified: Secondary | ICD-10-CM | POA: Diagnosis not present

## 2016-05-04 DIAGNOSIS — I10 Essential (primary) hypertension: Secondary | ICD-10-CM | POA: Diagnosis not present

## 2016-05-04 DIAGNOSIS — E1165 Type 2 diabetes mellitus with hyperglycemia: Secondary | ICD-10-CM | POA: Diagnosis not present

## 2016-05-04 DIAGNOSIS — Z89421 Acquired absence of other right toe(s): Secondary | ICD-10-CM | POA: Diagnosis not present

## 2016-05-04 DIAGNOSIS — Z4781 Encounter for orthopedic aftercare following surgical amputation: Secondary | ICD-10-CM | POA: Diagnosis not present

## 2016-05-09 ENCOUNTER — Encounter: Payer: Self-pay | Admitting: Vascular Surgery

## 2016-05-10 ENCOUNTER — Other Ambulatory Visit: Payer: Self-pay

## 2016-05-10 ENCOUNTER — Encounter: Payer: Self-pay | Admitting: Vascular Surgery

## 2016-05-10 ENCOUNTER — Ambulatory Visit (INDEPENDENT_AMBULATORY_CARE_PROVIDER_SITE_OTHER): Payer: Medicare Other | Admitting: Vascular Surgery

## 2016-05-10 VITALS — BP 126/81 | HR 86 | Temp 98.1°F | Resp 16 | Ht 69.0 in | Wt 212.0 lb

## 2016-05-10 DIAGNOSIS — I70269 Atherosclerosis of native arteries of extremities with gangrene, unspecified extremity: Secondary | ICD-10-CM | POA: Insufficient documentation

## 2016-05-10 DIAGNOSIS — I70261 Atherosclerosis of native arteries of extremities with gangrene, right leg: Secondary | ICD-10-CM

## 2016-05-10 NOTE — Progress Notes (Addendum)
Referred by:  [name], MD [address] [place], [place] [address]  Reason for referral: non-healing R TMA  History of Present Illness  [name] is a 70 y.o. (1946-06-28) male w/ IDDM who presents with chief complaint: non-healing R TMA.  Onset of symptom occurred in early April 2017.  Pt reportedly injuried his right foot after kicking it into some furniture.  He was diagnosed with a forefoot infection and required a R TMA which was done on 02/01/16 along with a achilles tendon lengthening.  The patient has not healed that TMA and the distal skin flap necrosed.  The patient is referred for further evaluation for arterial insufficiency in the right foot.  He has already had ABI completed at [facility].  Pain is described as aching, severity 3-6/10, and associated with manipulating the wound.  Patient has attempted to treat this pain with wound care.  The patient has no rest pain symptoms.  He denies any active drainage.  He is using Santyl on the wound.  Atherosclerotic risk factors include: DM, HTN.   Past Medical History  Diagnosis Date  . Diabetes mellitus without complication (HCC)   . Heart problem   . Hypertension   . Cellulitis and abscess of toe 12/2015    rt great toe   . History of kidney stones     Past Surgical History  Procedure Laterality Date  . Coronary artery bypass graft    . Amputation Right 02/01/2016    Procedure: RIGHT TRANSMETATARSAL AMPUTATION;  Surgeon: Toni Arthurs, MD;  Location: [facility] OR;  Service: Orthopedics;  Laterality: Right;    Social History   Social History  . Marital Status: Married    Spouse Name: N/A  . Number of Children: N/A  . Years of Education: N/A   Occupational History  . Not on file.   Social History Main Topics  . Smoking status: Never Smoker   . Smokeless tobacco: Never Used  . Alcohol Use: 0.0 oz/week    0 Standard drinks or equivalent per week     Comment: occasional  . Drug Use: No  . Sexual Activity: Not on file   Other  Topics Concern  . Not on file   Social History Narrative    Family History: patient is unable to detail the medical history of his parents   Current Outpatient Prescriptions  Medication Sig Dispense Refill  . aspirin EC 81 MG tablet Take 81 mg by mouth daily.    . furosemide (LASIX) 40 MG tablet Take 40 mg by mouth daily.    Marland Kitchen HYDROmorphone (DILAUDID) 2 MG tablet Take 1-2 tablets (2-4 mg total) by mouth every 4 (four) hours as needed for moderate pain or severe pain. 30 tablet 0  . insulin aspart (NOVOLOG FLEXPEN) 100 UNIT/ML FlexPen Inject 5 Units into the skin 3 (three) times daily with meals. (Patient taking differently: Inject 5 Units into the skin 3 (three) times daily with meals. Sliding Scale 100 or below 12 units 101-150 14 units 151-200 16 units 201-250 18 units 251-300 20 units 301-350 22 units Over 350 call MD Over 350  Call PCP) 15 mL 2  . Insulin Glargine (LANTUS SOLOSTAR) 100 UNIT/ML Solostar Pen Inject 25 Units into the skin daily at 10 pm. (Patient taking differently: Inject 24 Units into the skin daily at 10 pm. Per patient he is taking 24 units twice daily  At 9:00 am and 9:00 pm) 15 mL 2  . Insulin Pen Needle 31G  X 5 MM MISC 1 each by Does not apply route 3 (three) times daily before meals. 50 each 1  . lisinopril (PRINIVIL,ZESTRIL) 10 MG tablet Take 10 mg by mouth daily.    Marland Kitchen. loratadine (CLARITIN) 10 MG tablet Take 10 mg by mouth daily.    . metFORMIN (GLUCOPHAGE) 1000 MG tablet Take 1,000 mg by mouth 2 (two) times daily with a meal.    . metoprolol (LOPRESSOR) 50 MG tablet Take 50 mg by mouth 3 (three) times daily.    . Multiple Vitamin (MULTIVITAMIN WITH MINERALS) TABS tablet Take 1 tablet by mouth daily.    Marland Kitchen. omeprazole (PRILOSEC OTC) 20 MG tablet Take 20 mg by mouth daily.    . polyethylene glycol (MIRALAX / GLYCOLAX) packet Take 17 g by mouth daily. While taking pain meds 14 each 0  . simvastatin (ZOCOR) 80 MG tablet Take 80 mg by mouth daily at 6 PM.    .  nitroGLYCERIN (NITROSTAT) 0.4 MG SL tablet     . SANTYL ointment      No current facility-administered medications for this visit.    No Known Allergies   REVIEW OF SYSTEMS:  (Positives checked otherwise negative)  CARDIOVASCULAR:   [ ]  chest pain,  [ ]  chest pressure,  [ ]  palpitations,  [ ]  shortness of breath when laying flat,  [ ]  shortness of breath with exertion,   [ ]  pain in feet when walking,  [ ]  pain in feet when laying flat, [ ]  history of blood clot in veins (DVT),  [ ]  history of phlebitis,  [x]  swelling in legs,  [ ]  varicose veins  PULMONARY:   [ ]  productive cough,  [ ]  asthma,  [ ]  wheezing  NEUROLOGIC:   [ ]  weakness in arms or legs,  [ ]  numbness in arms or legs,  [ ]  difficulty speaking or slurred speech,  [ ]  temporary loss of vision in one eye,  [ ]  dizziness  HEMATOLOGIC:   [ ]  bleeding problems,  [ ]  problems with blood clotting too easily  MUSCULOSKEL:   [ ]  joint pain, [ ]  joint swelling  GASTROINTEST:   [ ]  vomiting blood,  [ ]  blood in stool     GENITOURINARY:   [ ]  burning with urination,  [ ]  blood in urine  PSYCHIATRIC:   [ ]  history of major depression  INTEGUMENTARY:   [ ]  rashes,  [x]  ulcers  CONSTITUTIONAL:   [ ]  fever,  [ ]  chills   For VQI Use Only  PRE-ADM LIVING: Home  AMB STATUS: Ambulatory  CAD Sx: History of MI, but no symptoms No MI within 6 months  PRIOR CHF: None  STRESS TEST: [x]  No, [ ]  Normal, [ ]  + ischemia, [ ]  + MI, [ ]  Both   Physical Examination  Filed Vitals:   05/10/16 1552  BP: 126/81  Pulse: 86  Temp: 98.1 F (36.7 C)  Resp: 16  Height: 5\' 9"  (1.753 m)  Weight: 212 lb (96.163 kg)  SpO2: 97%   Body mass index is 31.29 kg/(m^2).  General: A&O x 3, WD, Obese,   Head: [place]/AT  Ear/Nose/Throat: Hearing grossly intact, nares w/o erythema or drainage, oropharynx w/ Erythema w/o Exudate, Mallampati score: 3  Eyes: PERRLA, EOMI  Neck: Supple, no nuchal rigidity, no  palpable LAD  Pulmonary: Sym exp, good air movt, CTAB, no rales, rhonchi, & wheezing  Cardiac: RRR, Nl S1, S2, no Murmurs, rubs or gallops  Vascular:  Vessel Right Left  Radial Palpable Palpable  Ulnar Palpable Palpable  Brachial Palpable Palpable  Carotid Palpable, without bruit Palpable, without bruit  Aorta Not palpable due to pannus N/A  Femoral Palpable Palpable  Popliteal Palpable Not palpable  PT Faintly Palpable Faintly Palpable  DP Faintly Palpable Faintly Palpable   Gastrointestinal: soft, NTND, -G/R, - HSM, - masses, - CVAT B  Musculoskeletal: M/S 5/5 throughout . Extremities without ischemic changes except right TMA with ischemic plantar flap with some exposed subcutaneous tissue and ischemic skin  Neurologic: CN 2-12 intact , Pain and light touch intact in extremities except decreased in both feet, Motor exam as listed above  Psychiatric: Judgment intact, Mood & affect appropriate for pt's clinical situation  Dermatologic: See M/S exam for extremity exam, no rashes otherwise noted  Lymph : No Cervical, Axillary, or Inguinal lymphadenopathy    Non-Invasive Vascular Imaging  Outside ABI (Date: 04/28/16)  R: 0.92, biphasic tibials  L: 0.95, biphasic tibials  Outside Studies/Documentation 2 pages of outside documents were reviewed including: outpatient ABI   Medical Decision Making  Cross Jorge is a 70 y.o. male who presents with: RLE critical limb ischemia, IDDM   I don't have the waveforms available from the outside study.  It has been my experience that some of the studies obtained from [facility] have a tendency to be incorrect.  In this IDDM with peripheral neuropathy, there is a high probability of medial calcification which limits the value of any non-invasive studies.    Given the 2 month history of poor R TMA healing, I would proceed with angiography immediately.  I discussed with the patient the natural history of critical limb ischemia: 25%  require amputation in one year, 50% are able to maintain their limbs in one year, and 25-30% die in one year due to comorbidities.  Given the limb threatening status of this patient, I recommend an aggressive work up including proceeding with an: Aortogram, Bilateral runoff and possible intervention. I discussed with the patient the nature of angiographic procedures, especially the limited patencies of any endovascular intervention. The patient is aware of that the risks of an angiographic procedure include but are not limited to: bleeding, infection, access site complications, embolization, rupture of treated vessel, dissection, possible need for emergent surgical intervention, and possible need for surgical procedures to treat the patient's pathology. The patient is aware of the risks and agrees to proceed.  The procedure is scheduled for: 17 JUL 17  I discussed in depth with the patient the nature of atherosclerosis, and emphasized the importance of maximal medical management including strict control of blood pressure, blood glucose, and lipid levels, antiplatelet agents, obtaining regular exercise, and cessation of smoking.  The patient is aware that without maximal medical management the underlying atherosclerotic disease process will progress, limiting the benefit of any interventions. The patient is currently on a statin: Zocor. The patient is currently on an anti-platelet: ASA.  Thank you for allowing [place] to participate in this patient's care.   Leonides Sake, MD [facility] [place] Office: [phone] Pager: [phone]  05/10/2016, 4:40 PM

## 2016-05-15 ENCOUNTER — Encounter (HOSPITAL_COMMUNITY)
Admission: RE | Disposition: A | Discharge status: Home / Self Care | Payer: Self-pay | Source: Ambulatory Visit | Attending: Vascular Surgery

## 2016-05-15 ENCOUNTER — Ambulatory Visit (HOSPITAL_COMMUNITY)
Admission: RE | Admit: 2016-05-15 | Discharge: 2016-05-15 | Disposition: A | Discharge status: Home / Self Care | Payer: Medicare Other | Source: Ambulatory Visit | Attending: Vascular Surgery | Admitting: Vascular Surgery

## 2016-05-15 ENCOUNTER — Other Ambulatory Visit: Payer: Self-pay

## 2016-05-15 DIAGNOSIS — Z951 Presence of aortocoronary bypass graft: Secondary | ICD-10-CM | POA: Diagnosis not present

## 2016-05-15 DIAGNOSIS — Z794 Long term (current) use of insulin: Secondary | ICD-10-CM | POA: Diagnosis not present

## 2016-05-15 DIAGNOSIS — I70261 Atherosclerosis of native arteries of extremities with gangrene, right leg: Secondary | ICD-10-CM | POA: Insufficient documentation

## 2016-05-15 DIAGNOSIS — Y835 Amputation of limb(s) as the cause of abnormal reaction of the patient, or of later complication, without mention of misadventure at the time of the procedure: Secondary | ICD-10-CM | POA: Diagnosis not present

## 2016-05-15 DIAGNOSIS — I70269 Atherosclerosis of native arteries of extremities with gangrene, unspecified extremity: Secondary | ICD-10-CM | POA: Diagnosis present

## 2016-05-15 DIAGNOSIS — I10 Essential (primary) hypertension: Secondary | ICD-10-CM | POA: Diagnosis not present

## 2016-05-15 DIAGNOSIS — T8781 Dehiscence of amputation stump: Secondary | ICD-10-CM | POA: Diagnosis not present

## 2016-05-15 DIAGNOSIS — E1152 Type 2 diabetes mellitus with diabetic peripheral angiopathy with gangrene: Secondary | ICD-10-CM | POA: Diagnosis not present

## 2016-05-15 DIAGNOSIS — T8789 Other complications of amputation stump: Secondary | ICD-10-CM | POA: Insufficient documentation

## 2016-05-15 DIAGNOSIS — Z87442 Personal history of urinary calculi: Secondary | ICD-10-CM | POA: Insufficient documentation

## 2016-05-15 DIAGNOSIS — Z7982 Long term (current) use of aspirin: Secondary | ICD-10-CM | POA: Diagnosis not present

## 2016-05-15 DIAGNOSIS — T8189XD Other complications of procedures, not elsewhere classified, subsequent encounter: Secondary | ICD-10-CM | POA: Diagnosis not present

## 2016-05-15 DIAGNOSIS — Z7984 Long term (current) use of oral hypoglycemic drugs: Secondary | ICD-10-CM | POA: Diagnosis not present

## 2016-05-15 HISTORY — PX: PERIPHERAL VASCULAR CATHETERIZATION: SHX172C

## 2016-05-15 LAB — POCT I-STAT, CHEM 8
BUN: 30 mg/dL — AB (ref 6–20)
CALCIUM ION: 1.18 mmol/L (ref 1.12–1.23)
CHLORIDE: 110 mmol/L (ref 101–111)
CREATININE: 1.1 mg/dL (ref 0.61–1.24)
GLUCOSE: 176 mg/dL — AB (ref 65–99)
HCT: 33 % — ABNORMAL LOW (ref 39.0–52.0)
Hemoglobin: 11.2 g/dL — ABNORMAL LOW (ref 13.0–17.0)
Potassium: 4.5 mmol/L (ref 3.5–5.1)
SODIUM: 142 mmol/L (ref 135–145)
TCO2: 21 mmol/L (ref 0–100)

## 2016-05-15 LAB — GLUCOSE, CAPILLARY: Glucose-Capillary: 146 mg/dL — ABNORMAL HIGH (ref 65–99)

## 2016-05-15 SURGERY — ABDOMINAL AORTOGRAM W/LOWER EXTREMITY
Anesthesia: LOCAL

## 2016-05-15 MED ORDER — OXYCODONE-ACETAMINOPHEN 5-325 MG PO TABS: 1.0000 | ORAL_TABLET | Freq: Four times a day (QID) | ORAL | Status: DC | PRN

## 2016-05-15 MED ORDER — MIDAZOLAM HCL 2 MG/2ML IJ SOLN
INTRAMUSCULAR | Status: DC | PRN
  Administered 2016-05-15: 1 mg via INTRAVENOUS

## 2016-05-15 MED ORDER — ACETAMINOPHEN 325 MG PO TABS: 650.0000 mg | ORAL_TABLET | ORAL | Status: DC | PRN

## 2016-05-15 MED ORDER — FENTANYL CITRATE (PF) 100 MCG/2ML IJ SOLN
INTRAMUSCULAR | Status: DC | PRN
  Administered 2016-05-15: 50 ug via INTRAVENOUS

## 2016-05-15 MED ORDER — HEPARIN (PORCINE) IN NACL 2-0.9 UNIT/ML-% IJ SOLN
INTRAMUSCULAR | Status: DC | PRN
  Administered 2016-05-15: 1000 mL

## 2016-05-15 MED ORDER — MORPHINE SULFATE (PF) 2 MG/ML IV SOLN: 2.0000 mg | INTRAVENOUS | Status: DC | PRN

## 2016-05-15 MED ORDER — SODIUM CHLORIDE 0.9 % IV SOLN: 1.0000 mL/kg/h | INTRAVENOUS | Status: DC

## 2016-05-15 MED ORDER — SODIUM CHLORIDE 0.9 % IV SOLN
INTRAVENOUS | Status: DC
  Administered 2016-05-15: 12:00:00 via INTRAVENOUS

## 2016-05-15 MED ORDER — HYDRALAZINE HCL 20 MG/ML IJ SOLN: 10.0000 mg | INTRAMUSCULAR | Status: DC | PRN

## 2016-05-15 MED ORDER — IODIXANOL 320 MG/ML IV SOLN
INTRAVENOUS | Status: DC | PRN
  Administered 2016-05-15: 108 mL

## 2016-05-15 MED ORDER — LIDOCAINE HCL (PF) 1 % IJ SOLN
INTRAMUSCULAR | Status: DC | PRN
  Administered 2016-05-15: 15 mL via SUBCUTANEOUS

## 2016-05-15 SURGICAL SUPPLY — 11 items
CATH OMNI FLUSH 5F 65CM (CATHETERS) ×2 IMPLANT
CATH STRAIGHT 5FR 65CM (CATHETERS) ×2 IMPLANT
COVER PRB 48X5XTLSCP FOLD TPE (BAG) ×1 IMPLANT
COVER PROBE 5X48 (BAG) ×1
KIT MICROINTRODUCER STIFF 5F (SHEATH) ×2 IMPLANT
KIT PV (KITS) ×2 IMPLANT
SHEATH PINNACLE 5F 10CM (SHEATH) ×2 IMPLANT
SYR MEDRAD MARK V 150ML (SYRINGE) ×2 IMPLANT
TRANSDUCER W/STOPCOCK (MISCELLANEOUS) ×2 IMPLANT
TRAY PV CATH (CUSTOM PROCEDURE TRAY) ×2 IMPLANT
WIRE BENTSON .035X145CM (WIRE) ×2 IMPLANT

## 2016-05-15 NOTE — Interval H&P Note (Signed)
[facility] [place]  History and Physical Update  The patient was interviewed and re-examined.  The patient's previous History and Physical has been reviewed and is unchanged from my consult .  There is no change in the plan of care: Aortogram, bilateral leg runoff, and possible right leg intervention.    I discussed with the patient the nature of angiographic procedures, especially the limited patencies of any endovascular intervention.    The patient is aware of that the risks of an angiographic procedure include but are not limited to: bleeding, infection, access site complications, renal failure, embolization, rupture of vessel, dissection, arteriovenous fistula, possible need for emergent surgical intervention, possible need for surgical procedures to treat the patient's pathology, anaphylactic reaction to contrast, and stroke and death.    The patient is aware of the risks and agrees to proceed.   Todd SakeBrian Pleasant Bensinger, MD [facility] [place] Office: [phone] Pager: [phone]  05/15/2016, 12:12 PM

## 2016-05-15 NOTE — H&P (View-Only) (Signed)
Referred by:  [name], MD [address] [place], [place] [address]  Reason for referral: non-healing R TMA  History of Present Illness  [name] is a 70 y.o. (1946-06-28) male w/ IDDM who presents with chief complaint: non-healing R TMA.  Onset of symptom occurred in early April 2017.  Pt reportedly injuried his right foot after kicking it into some furniture.  He was diagnosed with a forefoot infection and required a R TMA which was done on 02/01/16 along with a achilles tendon lengthening.  The patient has not healed that TMA and the distal skin flap necrosed.  The patient is referred for further evaluation for arterial insufficiency in the right foot.  He has already had ABI completed at [facility].  Pain is described as aching, severity 3-6/10, and associated with manipulating the wound.  Patient has attempted to treat this pain with wound care.  The patient has no rest pain symptoms.  He denies any active drainage.  He is using Santyl on the wound.  Atherosclerotic risk factors include: DM, HTN.   Past Medical History  Diagnosis Date  . Diabetes mellitus without complication (HCC)   . Heart problem   . Hypertension   . Cellulitis and abscess of toe 12/2015    rt great toe   . History of kidney stones     Past Surgical History  Procedure Laterality Date  . Coronary artery bypass graft    . Amputation Right 02/01/2016    Procedure: RIGHT TRANSMETATARSAL AMPUTATION;  Surgeon: Toni Arthurs, MD;  Location: [facility] OR;  Service: Orthopedics;  Laterality: Right;    Social History   Social History  . Marital Status: Married    Spouse Name: N/A  . Number of Children: N/A  . Years of Education: N/A   Occupational History  . Not on file.   Social History Main Topics  . Smoking status: Never Smoker   . Smokeless tobacco: Never Used  . Alcohol Use: 0.0 oz/week    0 Standard drinks or equivalent per week     Comment: occasional  . Drug Use: No  . Sexual Activity: Not on file   Other  Topics Concern  . Not on file   Social History Narrative    Family History: patient is unable to detail the medical history of his parents   Current Outpatient Prescriptions  Medication Sig Dispense Refill  . aspirin EC 81 MG tablet Take 81 mg by mouth daily.    . furosemide (LASIX) 40 MG tablet Take 40 mg by mouth daily.    Marland Kitchen HYDROmorphone (DILAUDID) 2 MG tablet Take 1-2 tablets (2-4 mg total) by mouth every 4 (four) hours as needed for moderate pain or severe pain. 30 tablet 0  . insulin aspart (NOVOLOG FLEXPEN) 100 UNIT/ML FlexPen Inject 5 Units into the skin 3 (three) times daily with meals. (Patient taking differently: Inject 5 Units into the skin 3 (three) times daily with meals. Sliding Scale 100 or below 12 units 101-150 14 units 151-200 16 units 201-250 18 units 251-300 20 units 301-350 22 units Over 350 call MD Over 350  Call PCP) 15 mL 2  . Insulin Glargine (LANTUS SOLOSTAR) 100 UNIT/ML Solostar Pen Inject 25 Units into the skin daily at 10 pm. (Patient taking differently: Inject 24 Units into the skin daily at 10 pm. Per patient he is taking 24 units twice daily  At 9:00 am and 9:00 pm) 15 mL 2  . Insulin Pen Needle 31G  X 5 MM MISC 1 each by Does not apply route 3 (three) times daily before meals. 50 each 1  . lisinopril (PRINIVIL,ZESTRIL) 10 MG tablet Take 10 mg by mouth daily.    Marland Kitchen. loratadine (CLARITIN) 10 MG tablet Take 10 mg by mouth daily.    . metFORMIN (GLUCOPHAGE) 1000 MG tablet Take 1,000 mg by mouth 2 (two) times daily with a meal.    . metoprolol (LOPRESSOR) 50 MG tablet Take 50 mg by mouth 3 (three) times daily.    . Multiple Vitamin (MULTIVITAMIN WITH MINERALS) TABS tablet Take 1 tablet by mouth daily.    Marland Kitchen. omeprazole (PRILOSEC OTC) 20 MG tablet Take 20 mg by mouth daily.    . polyethylene glycol (MIRALAX / GLYCOLAX) packet Take 17 g by mouth daily. While taking pain meds 14 each 0  . simvastatin (ZOCOR) 80 MG tablet Take 80 mg by mouth daily at 6 PM.    .  nitroGLYCERIN (NITROSTAT) 0.4 MG SL tablet     . SANTYL ointment      No current facility-administered medications for this visit.    No Known Allergies   REVIEW OF SYSTEMS:  (Positives checked otherwise negative)  CARDIOVASCULAR:   [ ]  chest pain,  [ ]  chest pressure,  [ ]  palpitations,  [ ]  shortness of breath when laying flat,  [ ]  shortness of breath with exertion,   [ ]  pain in feet when walking,  [ ]  pain in feet when laying flat, [ ]  history of blood clot in veins (DVT),  [ ]  history of phlebitis,  [x]  swelling in legs,  [ ]  varicose veins  PULMONARY:   [ ]  productive cough,  [ ]  asthma,  [ ]  wheezing  NEUROLOGIC:   [ ]  weakness in arms or legs,  [ ]  numbness in arms or legs,  [ ]  difficulty speaking or slurred speech,  [ ]  temporary loss of vision in one eye,  [ ]  dizziness  HEMATOLOGIC:   [ ]  bleeding problems,  [ ]  problems with blood clotting too easily  MUSCULOSKEL:   [ ]  joint pain, [ ]  joint swelling  GASTROINTEST:   [ ]  vomiting blood,  [ ]  blood in stool     GENITOURINARY:   [ ]  burning with urination,  [ ]  blood in urine  PSYCHIATRIC:   [ ]  history of major depression  INTEGUMENTARY:   [ ]  rashes,  [x]  ulcers  CONSTITUTIONAL:   [ ]  fever,  [ ]  chills   For VQI Use Only  PRE-ADM LIVING: Home  AMB STATUS: Ambulatory  CAD Sx: History of MI, but no symptoms No MI within 6 months  PRIOR CHF: None  STRESS TEST: [x]  No, [ ]  Normal, [ ]  + ischemia, [ ]  + MI, [ ]  Both   Physical Examination  Filed Vitals:   05/10/16 1552  BP: 126/81  Pulse: 86  Temp: 98.1 F (36.7 C)  Resp: 16  Height: 5\' 9"  (1.753 m)  Weight: 212 lb (96.163 kg)  SpO2: 97%   Body mass index is 31.29 kg/(m^2).  General: A&O x 3, WD, Obese,   Head: Moran/AT  Ear/Nose/Throat: Hearing grossly intact, nares w/o erythema or drainage, oropharynx w/ Erythema w/o Exudate, Mallampati score: 3  Eyes: PERRLA, EOMI  Neck: Supple, no nuchal rigidity, no  palpable LAD  Pulmonary: Sym exp, good air movt, CTAB, no rales, rhonchi, & wheezing  Cardiac: RRR, Nl S1, S2, no Murmurs, rubs or gallops  Vascular:  Vessel Right Left  Radial Palpable Palpable  Ulnar Palpable Palpable  Brachial Palpable Palpable  Carotid Palpable, without bruit Palpable, without bruit  Aorta Not palpable due to pannus N/A  Femoral Palpable Palpable  Popliteal Palpable Not palpable  PT Faintly Palpable Faintly Palpable  DP Faintly Palpable Faintly Palpable   Gastrointestinal: soft, NTND, -G/R, - HSM, - masses, - CVAT B  Musculoskeletal: M/S 5/5 throughout . Extremities without ischemic changes except right TMA with ischemic plantar flap with some exposed subcutaneous tissue and ischemic skin  Neurologic: CN 2-12 intact , Pain and light touch intact in extremities except decreased in both feet, Motor exam as listed above  Psychiatric: Judgment intact, Mood & affect appropriate for pt's clinical situation  Dermatologic: See M/S exam for extremity exam, no rashes otherwise noted  Lymph : No Cervical, Axillary, or Inguinal lymphadenopathy    Non-Invasive Vascular Imaging  Outside ABI (Date: 04/28/16)  R: 0.92, biphasic tibials  L: 0.95, biphasic tibials  Outside Studies/Documentation 2 pages of outside documents were reviewed including: outpatient ABI   Medical Decision Making  [name] is a 70 y.o. male who presents with: RLE critical limb ischemia, IDDM   I don't have the waveforms available from the outside study.  It has been my experience that some of the studies obtained from [facility] have a tendency to be incorrect.  In this IDDM with peripheral neuropathy, there is a high probability of medical calcification which limits the value of any non-invasive studies.    Given the 2 month history of poor R TMA healing, I would proceed with angiography immediately.  I discussed with the patient the natural history of critical limb ischemia: 25%  require amputation in one year, 50% are able to maintain their limbs in one year, and 25-30% die in one year due to comorbidities.  Given the limb threatening status of this patient, I recommend an aggressive work up including proceeding with an: Aortogram, Bilateral runoff and possible intervention. I discussed with the patient the nature of angiographic procedures, especially the limited patencies of any endovascular intervention. The patient is aware of that the risks of an angiographic procedure include but are not limited to: bleeding, infection, access site complications, embolization, rupture of treated vessel, dissection, possible need for emergent surgical intervention, and possible need for surgical procedures to treat the patient's pathology. The patient is aware of the risks and agrees to proceed.  The procedure is scheduled for: 17 JUL 17  I discussed in depth with the patient the nature of atherosclerosis, and emphasized the importance of maximal medical management including strict control of blood pressure, blood glucose, and lipid levels, antiplatelet agents, obtaining regular exercise, and cessation of smoking.  The patient is aware that without maximal medical management the underlying atherosclerotic disease process will progress, limiting the benefit of any interventions. The patient is currently on a statin: Zocor. The patient is currently on an anti-platelet: ASA.  Thank you for allowing [place] to participate in this patient's care.   Leonides Sake, MD [facility] Office: [phone] Pager: [phone]  05/10/2016, 4:40 PM

## 2016-05-15 NOTE — Discharge Instructions (Signed)
NO METFORMIN/GLUCOPHAGE FOR 2 DAYS ° ° °Angiogram, Care After °These instructions give you information about caring for yourself after your procedure. Your doctor may also give you more specific instructions. Call your doctor if you have any problems or questions after your procedure.  °HOME CARE °· Take medicines only as told by your doctor. °· Follow your doctor's instructions about: °¨ Care of the area where the tube was inserted. °¨ Bandage (dressing) changes and removal. °· You may shower 24-48 hours after the procedure or as told by your doctor. °· Do not take baths, swim, or use a hot tub until your doctor approves. °· Every day, check the area where the tube was inserted. Watch for: °¨ Redness, swelling, or pain. °¨ Fluid, blood, or pus. °· Do not apply powder or lotion to the site. °· Do not lift anything that is heavier than 10 lb (4.5 kg) for 5 days or as told by your doctor. °· Ask your doctor when you can: °¨ Return to work or school. °¨ Do physical activities or play sports. °¨ Have sex. °· Do not drive or operate heavy machinery for 24 hours or as told by your doctor. °· Have someone with you for the first 24 hours after the procedure. °· Keep all follow-up visits as told by your doctor. This is important. °GET HELP IF: °· You have a fever.   °· You have chills.   °· You have more bleeding from the area where the tube was inserted. Hold pressure on the area. °· You have redness, swelling, or pain in the area where the tube was inserted. °· You have fluid or pus coming from the area. °GET HELP RIGHT AWAY IF:  °· You have a lot of pain in the area where the tube was inserted. °· The area where the tube was inserted is bleeding, and the bleeding does not stop after 30 minutes of holding steady pressure on the area. °· The area near or just beyond the insertion site becomes pale, cool, tingly, or numb. °  °This information is not intended to replace advice given to you by your health care provider. Make  sure you discuss any questions you have with your health care provider. °  °Document Released: 01/12/2009 Document Revised: 11/06/2014 Document Reviewed: 03/19/2013 °Elsevier Interactive Patient Education ©2016 [employer] ° °

## 2016-05-15 NOTE — Op Note (Signed)
OPERATIVE NOTE   PROCEDURE: 1.  Left common femoral artery cannulation under ultrasound guidance 2.  Placement of catheter in aorta 3.  Aortogram 4.  Conscious sedation for 38 min 5.  Second order arterial selection 6.  Right leg runoff via catheter 7.  Left leg runoff via sheath  PRE-OPERATIVE DIAGNOSIS: non-healing transmetatarsal amputation  POST-OPERATIVE DIAGNOSIS: same as above   SURGEON: Todd Sake, MD  ANESTHESIA: conscious sedation  ESTIMATED BLOOD LOSS: 50 cc  CONTRAST: 108 cc  FINDING(S):  Aorta: patent  Superior mesenteric artery: patent Celiac artery: not visualized   Right Left  RA patent patent  CIA patent patent  EIA patent patent  IIA patent patent  CFA patent patent  SFA patent patent  PFA Patent, 75-90% stenosis in mid-segment of one branch patent  Pop patent Patent with 75-90% stenosis in below-the-knee segment  Trif patent patent  AT Occludes shortly after take off Occluded  Pero Patent with >90% stenosis, distal segment <1 mm Patent  PT Occluded Occluded  Feet No digital collaterals feed distal aspect of the TMA Only collateral filling of foot   SPECIMEN(S):  none  INDICATIONS:   [name] is a 70 y.o. male who presents with non-healing right transmetatarsal amputation.  The patient presents for: aortogram, bilateral leg runoff and possible right leg intervention.  I discussed with the patient the nature of angiographic procedures, especially the limited patencies of any endovascular intervention.  The patient is aware of that the risks of an angiographic procedure include but are not limited to: bleeding, infection, access site complications, renal failure, embolization, rupture of vessel, dissection, possible need for emergent surgical intervention, possible need for surgical procedures to treat the patient's pathology, and stroke and death.  The patient is aware of the risks and agrees to proceed.   DESCRIPTION: After full informed  consent was obtained from the patient, the patient was brought back to the angiography suite.  The patient was placed supine upon the angiography table and connected to cardiopulmonary monitoring equipment.  The patient was then given conscious sedation, the amounts of which are documented in the patient's chart.  The patient was prepped and drape in the standard fashion for an angiographic procedure.  At this point, attention was turned to the left groin.  The Omniflush catheter was then loaded over the wire up to the level of L1.  The catheter was connected to the power injector circuit.  After de-airring and de-clotting the circuit, a power injector aortogram was completed.  The findings are listed above. The Bentson wire was replaced in the catheter, and using the [facility] and Omniflushcatheter, the right common iliac artery was selected.  The wire was advanced into the external iliac artery.  The catheter would not advance due to acute angle bifurcation.  The catheter was exchanged for a straight catheter, which was lodged into the right external iliac artery.  An automated right leg runoff was completed after de-airring and de-clotting the circuit.  The findings are as listed above.    The [facility] wire was replaced in the catheter to straighten the crook of the catheter.  Both were removed together from the sheath.  The left sheath was aspirated and no clot was present.  The sheath was connected to the power injector circuit.  An automated left leg runoff was completed after de-airring and de-clotting the circuit.  The findings are as listed above.  The sheath was aspirated.  No clots were present and the sheath was  reloaded with heparinized saline.    Based on the images, this patient needs: right below-knee amputation if trial of wound care fails.   COMPLICATIONS: none  CONDITION: stable   Todd SakeBrian Chen, MD [facility] [place] Office: [phone] Pager:  [phone]  05/15/2016, 2:33 PM

## 2016-05-16 ENCOUNTER — Other Ambulatory Visit: Payer: Self-pay | Admitting: *Deleted

## 2016-05-16 ENCOUNTER — Encounter (HOSPITAL_COMMUNITY): Payer: Self-pay | Admitting: Vascular Surgery

## 2016-05-16 DIAGNOSIS — Z899 Acquired absence of limb, unspecified: Secondary | ICD-10-CM

## 2016-05-17 DIAGNOSIS — E1165 Type 2 diabetes mellitus with hyperglycemia: Secondary | ICD-10-CM | POA: Diagnosis not present

## 2016-05-17 DIAGNOSIS — Z4781 Encounter for orthopedic aftercare following surgical amputation: Secondary | ICD-10-CM | POA: Diagnosis not present

## 2016-05-17 DIAGNOSIS — I10 Essential (primary) hypertension: Secondary | ICD-10-CM | POA: Diagnosis not present

## 2016-05-17 DIAGNOSIS — Z4889 Encounter for other specified surgical aftercare: Secondary | ICD-10-CM | POA: Diagnosis not present

## 2016-05-17 DIAGNOSIS — D509 Iron deficiency anemia, unspecified: Secondary | ICD-10-CM | POA: Diagnosis not present

## 2016-05-17 DIAGNOSIS — Z89421 Acquired absence of other right toe(s): Secondary | ICD-10-CM | POA: Diagnosis not present

## 2016-05-18 ENCOUNTER — Telehealth: Payer: Self-pay | Admitting: Vascular Surgery

## 2016-05-18 ENCOUNTER — Encounter (HOSPITAL_BASED_OUTPATIENT_CLINIC_OR_DEPARTMENT_OTHER): Payer: Medicare Other | Attending: Internal Medicine

## 2016-05-18 DIAGNOSIS — E1140 Type 2 diabetes mellitus with diabetic neuropathy, unspecified: Secondary | ICD-10-CM | POA: Insufficient documentation

## 2016-05-18 DIAGNOSIS — L97512 Non-pressure chronic ulcer of other part of right foot with fat layer exposed: Secondary | ICD-10-CM | POA: Insufficient documentation

## 2016-05-18 DIAGNOSIS — Z794 Long term (current) use of insulin: Secondary | ICD-10-CM | POA: Diagnosis not present

## 2016-05-18 DIAGNOSIS — T8189XA Other complications of procedures, not elsewhere classified, initial encounter: Secondary | ICD-10-CM | POA: Diagnosis not present

## 2016-05-18 DIAGNOSIS — E1151 Type 2 diabetes mellitus with diabetic peripheral angiopathy without gangrene: Secondary | ICD-10-CM | POA: Diagnosis not present

## 2016-05-18 DIAGNOSIS — I2510 Atherosclerotic heart disease of native coronary artery without angina pectoris: Secondary | ICD-10-CM | POA: Diagnosis not present

## 2016-05-18 DIAGNOSIS — E1165 Type 2 diabetes mellitus with hyperglycemia: Secondary | ICD-10-CM | POA: Insufficient documentation

## 2016-05-18 DIAGNOSIS — E11621 Type 2 diabetes mellitus with foot ulcer: Secondary | ICD-10-CM | POA: Insufficient documentation

## 2016-05-18 DIAGNOSIS — Y835 Amputation of limb(s) as the cause of abnormal reaction of the patient, or of later complication, without mention of misadventure at the time of the procedure: Secondary | ICD-10-CM | POA: Diagnosis not present

## 2016-05-18 DIAGNOSIS — Z89431 Acquired absence of right foot: Secondary | ICD-10-CM | POA: Insufficient documentation

## 2016-05-18 NOTE — Telephone Encounter (Signed)
-----   Message from Sharee PimpleMarilyn K McChesney, RN sent at 05/16/2016 10:32 AM EDT ----- Regarding: ASAP appt with wound clinic   ----- Message -----    From: Fransisco HertzBrian L Chen, MD    Sent: 05/15/2016   2:44 PM      To: [facility] [address]  Jeraldine LootsFoye Wehrenberg [phone] [birth year]  PROCEDURE: 1.  Left common femoral artery cannulation under ultrasound guidance 2.  Placement of catheter in aorta 3.  Aortogram 4.  Conscious sedation for 38 min 5.  Second order arterial selection 6.  Right leg runoff via catheter 7.  Left leg runoff via sheath  Follow-up: 4 weeks  Orders(s) for follow-up:  1.  ASAP consult to [facility] wound care clinic, reason: non-reconstructable tibial artery disease, ischemic TMA

## 2016-05-18 NOTE — Telephone Encounter (Signed)
Sched appt 8/18 at 10:15. Could not get through to hm#, lm on cell# to inform pt of appt.

## 2016-05-22 ENCOUNTER — Encounter: Payer: Medicare Other | Admitting: Surgery

## 2016-05-23 ENCOUNTER — Other Ambulatory Visit: Payer: Self-pay | Admitting: Internal Medicine

## 2016-05-23 ENCOUNTER — Ambulatory Visit (HOSPITAL_COMMUNITY)
Admission: RE | Admit: 2016-05-23 | Discharge: 2016-05-23 | Disposition: A | Discharge status: Home / Self Care | Payer: Medicare Other | Source: Ambulatory Visit | Attending: Internal Medicine | Admitting: Internal Medicine

## 2016-05-23 DIAGNOSIS — Z89421 Acquired absence of other right toe(s): Secondary | ICD-10-CM | POA: Insufficient documentation

## 2016-05-23 DIAGNOSIS — X58XXXA Exposure to other specified factors, initial encounter: Secondary | ICD-10-CM | POA: Diagnosis not present

## 2016-05-23 DIAGNOSIS — T814XXA Infection following a procedure, initial encounter: Secondary | ICD-10-CM | POA: Insufficient documentation

## 2016-05-23 DIAGNOSIS — E1165 Type 2 diabetes mellitus with hyperglycemia: Secondary | ICD-10-CM | POA: Diagnosis not present

## 2016-05-23 DIAGNOSIS — E782 Mixed hyperlipidemia: Secondary | ICD-10-CM | POA: Diagnosis not present

## 2016-05-23 DIAGNOSIS — Z79899 Other long term (current) drug therapy: Secondary | ICD-10-CM | POA: Diagnosis not present

## 2016-05-23 DIAGNOSIS — S91301A Unspecified open wound, right foot, initial encounter: Secondary | ICD-10-CM | POA: Diagnosis not present

## 2016-05-23 DIAGNOSIS — M869 Osteomyelitis, unspecified: Secondary | ICD-10-CM

## 2016-05-29 ENCOUNTER — Other Ambulatory Visit: Payer: Self-pay | Admitting: Internal Medicine

## 2016-05-29 DIAGNOSIS — E1142 Type 2 diabetes mellitus with diabetic polyneuropathy: Secondary | ICD-10-CM | POA: Diagnosis not present

## 2016-05-29 DIAGNOSIS — E1151 Type 2 diabetes mellitus with diabetic peripheral angiopathy without gangrene: Secondary | ICD-10-CM | POA: Diagnosis not present

## 2016-05-29 DIAGNOSIS — Z794 Long term (current) use of insulin: Secondary | ICD-10-CM | POA: Diagnosis not present

## 2016-05-29 DIAGNOSIS — L97509 Non-pressure chronic ulcer of other part of unspecified foot with unspecified severity: Principal | ICD-10-CM

## 2016-05-29 DIAGNOSIS — E11621 Type 2 diabetes mellitus with foot ulcer: Secondary | ICD-10-CM | POA: Diagnosis not present

## 2016-05-29 DIAGNOSIS — I70203 Unspecified atherosclerosis of native arteries of extremities, bilateral legs: Secondary | ICD-10-CM | POA: Diagnosis not present

## 2016-05-29 DIAGNOSIS — E13621 Other specified diabetes mellitus with foot ulcer: Secondary | ICD-10-CM

## 2016-05-29 DIAGNOSIS — I10 Essential (primary) hypertension: Secondary | ICD-10-CM | POA: Diagnosis not present

## 2016-05-29 DIAGNOSIS — L97512 Non-pressure chronic ulcer of other part of right foot with fat layer exposed: Secondary | ICD-10-CM | POA: Diagnosis not present

## 2016-05-29 DIAGNOSIS — E1140 Type 2 diabetes mellitus with diabetic neuropathy, unspecified: Secondary | ICD-10-CM | POA: Diagnosis not present

## 2016-05-29 DIAGNOSIS — E782 Mixed hyperlipidemia: Secondary | ICD-10-CM | POA: Diagnosis not present

## 2016-05-29 DIAGNOSIS — E1165 Type 2 diabetes mellitus with hyperglycemia: Secondary | ICD-10-CM | POA: Diagnosis not present

## 2016-05-29 DIAGNOSIS — I2510 Atherosclerotic heart disease of native coronary artery without angina pectoris: Secondary | ICD-10-CM | POA: Diagnosis not present

## 2016-05-29 DIAGNOSIS — T8189XA Other complications of procedures, not elsewhere classified, initial encounter: Secondary | ICD-10-CM | POA: Diagnosis not present

## 2016-05-29 DIAGNOSIS — Z79899 Other long term (current) drug therapy: Secondary | ICD-10-CM | POA: Diagnosis not present

## 2016-05-30 ENCOUNTER — Ambulatory Visit (HOSPITAL_COMMUNITY)
Admission: RE | Admit: 2016-05-30 | Discharge: 2016-05-30 | Disposition: A | Discharge status: Home / Self Care | Payer: Medicare Other | Source: Ambulatory Visit | Attending: Internal Medicine | Admitting: Internal Medicine

## 2016-05-30 DIAGNOSIS — E13621 Other specified diabetes mellitus with foot ulcer: Secondary | ICD-10-CM

## 2016-05-30 DIAGNOSIS — L97512 Non-pressure chronic ulcer of other part of right foot with fat layer exposed: Secondary | ICD-10-CM | POA: Insufficient documentation

## 2016-05-30 DIAGNOSIS — E11621 Type 2 diabetes mellitus with foot ulcer: Secondary | ICD-10-CM | POA: Diagnosis not present

## 2016-05-30 DIAGNOSIS — Z9889 Other specified postprocedural states: Secondary | ICD-10-CM | POA: Insufficient documentation

## 2016-05-30 DIAGNOSIS — S91301A Unspecified open wound, right foot, initial encounter: Secondary | ICD-10-CM | POA: Diagnosis not present

## 2016-05-30 DIAGNOSIS — L97509 Non-pressure chronic ulcer of other part of unspecified foot with unspecified severity: Secondary | ICD-10-CM

## 2016-05-30 MED ORDER — GADOBENATE DIMEGLUMINE 529 MG/ML IV SOLN
20.0000 mL | Freq: Once | INTRAVENOUS | Status: AC | PRN
  Administered 2016-05-30: 20 mL via INTRAVENOUS

## 2016-05-31 DIAGNOSIS — Z89421 Acquired absence of other right toe(s): Secondary | ICD-10-CM | POA: Diagnosis not present

## 2016-05-31 DIAGNOSIS — I10 Essential (primary) hypertension: Secondary | ICD-10-CM | POA: Diagnosis not present

## 2016-05-31 DIAGNOSIS — Z4781 Encounter for orthopedic aftercare following surgical amputation: Secondary | ICD-10-CM | POA: Diagnosis not present

## 2016-05-31 DIAGNOSIS — D509 Iron deficiency anemia, unspecified: Secondary | ICD-10-CM | POA: Diagnosis not present

## 2016-05-31 DIAGNOSIS — E1165 Type 2 diabetes mellitus with hyperglycemia: Secondary | ICD-10-CM | POA: Diagnosis not present

## 2016-05-31 DIAGNOSIS — Z4889 Encounter for other specified surgical aftercare: Secondary | ICD-10-CM | POA: Diagnosis not present

## 2016-06-03 DIAGNOSIS — K219 Gastro-esophageal reflux disease without esophagitis: Secondary | ICD-10-CM | POA: Diagnosis not present

## 2016-06-03 DIAGNOSIS — E1165 Type 2 diabetes mellitus with hyperglycemia: Secondary | ICD-10-CM | POA: Diagnosis not present

## 2016-06-03 DIAGNOSIS — Z89421 Acquired absence of other right toe(s): Secondary | ICD-10-CM | POA: Diagnosis not present

## 2016-06-03 DIAGNOSIS — Z794 Long term (current) use of insulin: Secondary | ICD-10-CM | POA: Diagnosis not present

## 2016-06-03 DIAGNOSIS — Z5181 Encounter for therapeutic drug level monitoring: Secondary | ICD-10-CM | POA: Diagnosis not present

## 2016-06-03 DIAGNOSIS — E785 Hyperlipidemia, unspecified: Secondary | ICD-10-CM | POA: Diagnosis not present

## 2016-06-03 DIAGNOSIS — I10 Essential (primary) hypertension: Secondary | ICD-10-CM | POA: Diagnosis not present

## 2016-06-03 DIAGNOSIS — D509 Iron deficiency anemia, unspecified: Secondary | ICD-10-CM | POA: Diagnosis not present

## 2016-06-03 DIAGNOSIS — T8189XD Other complications of procedures, not elsewhere classified, subsequent encounter: Secondary | ICD-10-CM | POA: Diagnosis not present

## 2016-06-05 ENCOUNTER — Encounter (HOSPITAL_BASED_OUTPATIENT_CLINIC_OR_DEPARTMENT_OTHER): Payer: Medicare Other | Attending: Internal Medicine

## 2016-06-05 DIAGNOSIS — Z89431 Acquired absence of right foot: Secondary | ICD-10-CM | POA: Diagnosis not present

## 2016-06-05 DIAGNOSIS — I2510 Atherosclerotic heart disease of native coronary artery without angina pectoris: Secondary | ICD-10-CM | POA: Insufficient documentation

## 2016-06-05 DIAGNOSIS — E11621 Type 2 diabetes mellitus with foot ulcer: Secondary | ICD-10-CM | POA: Diagnosis not present

## 2016-06-05 DIAGNOSIS — E1140 Type 2 diabetes mellitus with diabetic neuropathy, unspecified: Secondary | ICD-10-CM | POA: Diagnosis not present

## 2016-06-05 DIAGNOSIS — I10 Essential (primary) hypertension: Secondary | ICD-10-CM | POA: Insufficient documentation

## 2016-06-05 DIAGNOSIS — T8189XA Other complications of procedures, not elsewhere classified, initial encounter: Secondary | ICD-10-CM | POA: Diagnosis not present

## 2016-06-05 DIAGNOSIS — L97512 Non-pressure chronic ulcer of other part of right foot with fat layer exposed: Secondary | ICD-10-CM | POA: Diagnosis not present

## 2016-06-05 DIAGNOSIS — M1990 Unspecified osteoarthritis, unspecified site: Secondary | ICD-10-CM | POA: Insufficient documentation

## 2016-06-05 DIAGNOSIS — Y835 Amputation of limb(s) as the cause of abnormal reaction of the patient, or of later complication, without mention of misadventure at the time of the procedure: Secondary | ICD-10-CM | POA: Insufficient documentation

## 2016-06-05 DIAGNOSIS — T8131XD Disruption of external operation (surgical) wound, not elsewhere classified, subsequent encounter: Secondary | ICD-10-CM | POA: Diagnosis not present

## 2016-06-06 DIAGNOSIS — I739 Peripheral vascular disease, unspecified: Secondary | ICD-10-CM | POA: Diagnosis not present

## 2016-06-06 DIAGNOSIS — I10 Essential (primary) hypertension: Secondary | ICD-10-CM | POA: Diagnosis not present

## 2016-06-06 DIAGNOSIS — I2510 Atherosclerotic heart disease of native coronary artery without angina pectoris: Secondary | ICD-10-CM | POA: Diagnosis not present

## 2016-06-06 DIAGNOSIS — E118 Type 2 diabetes mellitus with unspecified complications: Secondary | ICD-10-CM | POA: Diagnosis not present

## 2016-06-06 DIAGNOSIS — E785 Hyperlipidemia, unspecified: Secondary | ICD-10-CM | POA: Diagnosis not present

## 2016-06-06 DIAGNOSIS — T148 Other injury of unspecified body region: Secondary | ICD-10-CM | POA: Diagnosis not present

## 2016-06-06 DIAGNOSIS — I998 Other disorder of circulatory system: Secondary | ICD-10-CM | POA: Diagnosis not present

## 2016-06-12 DIAGNOSIS — E1159 Type 2 diabetes mellitus with other circulatory complications: Secondary | ICD-10-CM | POA: Diagnosis not present

## 2016-06-12 DIAGNOSIS — L97512 Non-pressure chronic ulcer of other part of right foot with fat layer exposed: Secondary | ICD-10-CM | POA: Diagnosis not present

## 2016-06-12 DIAGNOSIS — T8189XA Other complications of procedures, not elsewhere classified, initial encounter: Secondary | ICD-10-CM | POA: Diagnosis not present

## 2016-06-12 DIAGNOSIS — Z6831 Body mass index (BMI) 31.0-31.9, adult: Secondary | ICD-10-CM | POA: Diagnosis not present

## 2016-06-12 DIAGNOSIS — E1140 Type 2 diabetes mellitus with diabetic neuropathy, unspecified: Secondary | ICD-10-CM | POA: Diagnosis not present

## 2016-06-12 DIAGNOSIS — M1990 Unspecified osteoarthritis, unspecified site: Secondary | ICD-10-CM | POA: Diagnosis not present

## 2016-06-12 DIAGNOSIS — Z89431 Acquired absence of right foot: Secondary | ICD-10-CM | POA: Diagnosis not present

## 2016-06-12 DIAGNOSIS — E1165 Type 2 diabetes mellitus with hyperglycemia: Secondary | ICD-10-CM | POA: Diagnosis not present

## 2016-06-12 DIAGNOSIS — E11621 Type 2 diabetes mellitus with foot ulcer: Secondary | ICD-10-CM | POA: Diagnosis not present

## 2016-06-12 DIAGNOSIS — I2510 Atherosclerotic heart disease of native coronary artery without angina pectoris: Secondary | ICD-10-CM | POA: Diagnosis not present

## 2016-06-12 DIAGNOSIS — I10 Essential (primary) hypertension: Secondary | ICD-10-CM | POA: Diagnosis not present

## 2016-06-12 NOTE — Progress Notes (Deleted)
Postoperative Visit   History of Present Illness  [name] is a 70 y.o. male who presents for postoperative follow-up from procedure on Date: 05/15/16: Dx BRo.  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  The patient was referred to wound care for attempt at salvage.  The patient's wounds are *** healed.  The patient notes *** resolution of lower extremity symptoms.  The patient is *** able to complete their activities of daily living.  The patient's current symptoms are: ***.  Past Medical History, Past Surgical History, Social History, Family History, Medications, Allergies, and Review of Systems are unchanged from previous evaluation on 05/15/16.  Current Outpatient Prescriptions  Medication Sig Dispense Refill  . furosemide (LASIX) 40 MG tablet Take 40 mg by mouth daily.    Marland Kitchen. HYDROmorphone (DILAUDID) 2 MG tablet Take 1-2 tablets (2-4 mg total) by mouth every 4 (four) hours as needed for moderate pain or severe pain. 30 tablet 0  . insulin aspart (NOVOLOG FLEXPEN) 100 UNIT/ML FlexPen Inject 5 Units into the skin 3 (three) times daily with meals. (Patient taking differently: Inject 12 Units into the skin 3 (three) times daily with meals. Sliding Scale 100 or below 12 units 101-150 14 units 151-200 16 units 201-250 18 units 251-300 20 units 301-350 22 units Over 350 call MD Over 350  Call PCP) 15 mL 2  . Insulin Glargine (LANTUS SOLOSTAR) 100 UNIT/ML Solostar Pen Inject 25 Units into the skin daily at 10 pm. (Patient taking differently: Inject 24 Units into the skin daily at 10 pm. Per patient he is taking 24 units twice daily  At 9:00 am and 9:00 pm) 15 mL 2  . Insulin Pen Needle 31G X 5 MM MISC 1 each by Does not apply route 3 (three) times daily before meals. 50 each 1  . lisinopril (PRINIVIL,ZESTRIL) 10 MG tablet Take 10 mg by mouth daily.    Marland Kitchen. loratadine (CLARITIN) 10 MG tablet Take 10 mg by  mouth daily.    . metFORMIN (GLUCOPHAGE) 1000 MG tablet Take 1,000 mg by mouth 2 (two) times daily with a meal.    . metoprolol (LOPRESSOR) 50 MG tablet Take 50 mg by mouth 3 (three) times daily.    . Multiple Vitamin (MULTIVITAMIN WITH MINERALS) TABS tablet Take 1 tablet by mouth daily.    . nitroGLYCERIN (NITROSTAT) 0.4 MG SL tablet Place 0.4 mg under the tongue every 5 (five) minutes as needed for chest pain.     Marland Kitchen. omeprazole (PRILOSEC OTC) 20 MG tablet Take 20 mg by mouth daily.    . simvastatin (ZOCOR) 80 MG tablet Take 80 mg by mouth daily at 6 PM.     No current facility-administered medications for this visit.     On ROS: ***, ***  For VQI Use Only  PRE-ADM LIVING: Home  AMB STATUS: Ambulatory  Physical Examination  There were no vitals filed for this visit. There is no height or weight on file to calculate BMI.  General: A&O x 3, WDWN  Pulmonary: Sym exp, good air movt, CTAB, no rales, rhonchi, & wheezing  Cardiac: RRR, Nl S1, S2, no Murmurs, rubs or gallops  Vascular: Vessel Right Left  Radial Palpable Palpable  Ulnar Palpable Palpable  Brachial Palpable Palpable  Carotid Palpable, without bruit Palpable, without bruit  Aorta Not palpable N/A  Femoral Palpable Palpable  Popliteal Not palpable Not palpable  PT Not Palpable Not Palpable  DP Not  Palpable Not Palpable    Gastrointestinal: soft, NTND, -G/R, - HSM, - masses, - CVAT B  Musculoskeletal: M/S 5/5 except R foot not test, Extremities without ischemic changes except  Ischemic R foot TMA, L groin with***out hematoma***, *** echymosis present at cannulation site  Neurologic:  Pain and light touch intact in extremities, Motor exam as listed above   Medical Decision Making  [name] is a 70 y.o. male who presents s/p Ao, BRo.  Based on his angiographic findings, this patient needs: ***. I discussed in depth with the patient the nature of atherosclerosis, and emphasized the importance of maximal  medical management including strict control of blood pressure, blood glucose, and lipid levels, obtaining regular exercise, and cessation of smoking.  The patient is aware that without maximal medical management the underlying atherosclerotic disease process will progress, limiting the benefit of any interventions. The patient is currently on a statin: Zocor. The patient is currently not on an anti-platelet: ***.  ***The patient will be started on ASA 81 mg PO daily.  Thank you for allowing us to participate in this patient's care.  Leonides SakeBrian Chen, MD, FACS [facility] [place] Office: [phone] Pager: [phone]

## 2016-06-13 ENCOUNTER — Encounter: Payer: Self-pay | Admitting: Vascular Surgery

## 2016-06-13 DIAGNOSIS — Z89421 Acquired absence of other right toe(s): Secondary | ICD-10-CM | POA: Diagnosis not present

## 2016-06-13 DIAGNOSIS — E1165 Type 2 diabetes mellitus with hyperglycemia: Secondary | ICD-10-CM | POA: Diagnosis not present

## 2016-06-13 DIAGNOSIS — E785 Hyperlipidemia, unspecified: Secondary | ICD-10-CM | POA: Diagnosis not present

## 2016-06-13 DIAGNOSIS — I10 Essential (primary) hypertension: Secondary | ICD-10-CM | POA: Diagnosis not present

## 2016-06-13 DIAGNOSIS — T8189XD Other complications of procedures, not elsewhere classified, subsequent encounter: Secondary | ICD-10-CM | POA: Diagnosis not present

## 2016-06-13 DIAGNOSIS — D509 Iron deficiency anemia, unspecified: Secondary | ICD-10-CM | POA: Diagnosis not present

## 2016-06-14 DIAGNOSIS — I739 Peripheral vascular disease, unspecified: Secondary | ICD-10-CM | POA: Diagnosis not present

## 2016-06-14 DIAGNOSIS — E1165 Type 2 diabetes mellitus with hyperglycemia: Secondary | ICD-10-CM | POA: Diagnosis not present

## 2016-06-16 ENCOUNTER — Ambulatory Visit: Payer: Medicare Other | Admitting: Vascular Surgery

## 2016-06-16 DIAGNOSIS — I10 Essential (primary) hypertension: Secondary | ICD-10-CM | POA: Diagnosis not present

## 2016-06-16 DIAGNOSIS — E785 Hyperlipidemia, unspecified: Secondary | ICD-10-CM | POA: Diagnosis not present

## 2016-06-16 DIAGNOSIS — T8189XD Other complications of procedures, not elsewhere classified, subsequent encounter: Secondary | ICD-10-CM | POA: Diagnosis not present

## 2016-06-16 DIAGNOSIS — Z89421 Acquired absence of other right toe(s): Secondary | ICD-10-CM | POA: Diagnosis not present

## 2016-06-16 DIAGNOSIS — E1165 Type 2 diabetes mellitus with hyperglycemia: Secondary | ICD-10-CM | POA: Diagnosis not present

## 2016-06-16 DIAGNOSIS — D509 Iron deficiency anemia, unspecified: Secondary | ICD-10-CM | POA: Diagnosis not present

## 2016-06-19 DIAGNOSIS — T8189XA Other complications of procedures, not elsewhere classified, initial encounter: Secondary | ICD-10-CM | POA: Diagnosis not present

## 2016-06-19 DIAGNOSIS — E782 Mixed hyperlipidemia: Secondary | ICD-10-CM | POA: Diagnosis not present

## 2016-06-19 DIAGNOSIS — E11621 Type 2 diabetes mellitus with foot ulcer: Secondary | ICD-10-CM | POA: Diagnosis not present

## 2016-06-19 DIAGNOSIS — Z794 Long term (current) use of insulin: Secondary | ICD-10-CM | POA: Diagnosis not present

## 2016-06-19 DIAGNOSIS — Z79899 Other long term (current) drug therapy: Secondary | ICD-10-CM | POA: Diagnosis not present

## 2016-06-19 DIAGNOSIS — E1142 Type 2 diabetes mellitus with diabetic polyneuropathy: Secondary | ICD-10-CM | POA: Diagnosis not present

## 2016-06-19 DIAGNOSIS — M1990 Unspecified osteoarthritis, unspecified site: Secondary | ICD-10-CM | POA: Diagnosis not present

## 2016-06-19 DIAGNOSIS — Z89431 Acquired absence of right foot: Secondary | ICD-10-CM | POA: Diagnosis not present

## 2016-06-19 DIAGNOSIS — E1140 Type 2 diabetes mellitus with diabetic neuropathy, unspecified: Secondary | ICD-10-CM | POA: Diagnosis not present

## 2016-06-19 DIAGNOSIS — I70203 Unspecified atherosclerosis of native arteries of extremities, bilateral legs: Secondary | ICD-10-CM | POA: Diagnosis not present

## 2016-06-19 DIAGNOSIS — I10 Essential (primary) hypertension: Secondary | ICD-10-CM | POA: Diagnosis not present

## 2016-06-19 DIAGNOSIS — L97512 Non-pressure chronic ulcer of other part of right foot with fat layer exposed: Secondary | ICD-10-CM | POA: Diagnosis not present

## 2016-06-19 DIAGNOSIS — I2510 Atherosclerotic heart disease of native coronary artery without angina pectoris: Secondary | ICD-10-CM | POA: Diagnosis not present

## 2016-06-19 DIAGNOSIS — E1165 Type 2 diabetes mellitus with hyperglycemia: Secondary | ICD-10-CM | POA: Diagnosis not present

## 2016-06-20 DIAGNOSIS — E785 Hyperlipidemia, unspecified: Secondary | ICD-10-CM | POA: Diagnosis not present

## 2016-06-20 DIAGNOSIS — E118 Type 2 diabetes mellitus with unspecified complications: Secondary | ICD-10-CM | POA: Diagnosis not present

## 2016-06-20 DIAGNOSIS — E119 Type 2 diabetes mellitus without complications: Secondary | ICD-10-CM | POA: Diagnosis not present

## 2016-06-20 DIAGNOSIS — Z951 Presence of aortocoronary bypass graft: Secondary | ICD-10-CM | POA: Diagnosis not present

## 2016-06-20 DIAGNOSIS — Z7984 Long term (current) use of oral hypoglycemic drugs: Secondary | ICD-10-CM | POA: Diagnosis not present

## 2016-06-20 DIAGNOSIS — I701 Atherosclerosis of renal artery: Secondary | ICD-10-CM | POA: Diagnosis not present

## 2016-06-20 DIAGNOSIS — I739 Peripheral vascular disease, unspecified: Secondary | ICD-10-CM | POA: Diagnosis not present

## 2016-06-20 DIAGNOSIS — I2510 Atherosclerotic heart disease of native coronary artery without angina pectoris: Secondary | ICD-10-CM | POA: Diagnosis not present

## 2016-06-20 DIAGNOSIS — I10 Essential (primary) hypertension: Secondary | ICD-10-CM | POA: Diagnosis not present

## 2016-06-20 DIAGNOSIS — I998 Other disorder of circulatory system: Secondary | ICD-10-CM | POA: Diagnosis not present

## 2016-06-20 DIAGNOSIS — I70213 Atherosclerosis of native arteries of extremities with intermittent claudication, bilateral legs: Secondary | ICD-10-CM | POA: Diagnosis not present

## 2016-06-26 DIAGNOSIS — L97511 Non-pressure chronic ulcer of other part of right foot limited to breakdown of skin: Secondary | ICD-10-CM | POA: Diagnosis not present

## 2016-06-26 DIAGNOSIS — Z89431 Acquired absence of right foot: Secondary | ICD-10-CM | POA: Diagnosis not present

## 2016-06-26 DIAGNOSIS — I2510 Atherosclerotic heart disease of native coronary artery without angina pectoris: Secondary | ICD-10-CM | POA: Diagnosis not present

## 2016-06-26 DIAGNOSIS — I10 Essential (primary) hypertension: Secondary | ICD-10-CM | POA: Diagnosis not present

## 2016-06-26 DIAGNOSIS — E1140 Type 2 diabetes mellitus with diabetic neuropathy, unspecified: Secondary | ICD-10-CM | POA: Diagnosis not present

## 2016-06-26 DIAGNOSIS — E11621 Type 2 diabetes mellitus with foot ulcer: Secondary | ICD-10-CM | POA: Diagnosis not present

## 2016-06-26 DIAGNOSIS — M1990 Unspecified osteoarthritis, unspecified site: Secondary | ICD-10-CM | POA: Diagnosis not present

## 2016-06-26 DIAGNOSIS — T8189XA Other complications of procedures, not elsewhere classified, initial encounter: Secondary | ICD-10-CM | POA: Diagnosis not present

## 2016-06-29 DIAGNOSIS — I2510 Atherosclerotic heart disease of native coronary artery without angina pectoris: Secondary | ICD-10-CM | POA: Diagnosis not present

## 2016-06-29 DIAGNOSIS — I998 Other disorder of circulatory system: Secondary | ICD-10-CM | POA: Diagnosis not present

## 2016-06-29 DIAGNOSIS — E119 Type 2 diabetes mellitus without complications: Secondary | ICD-10-CM | POA: Diagnosis not present

## 2016-06-29 DIAGNOSIS — I10 Essential (primary) hypertension: Secondary | ICD-10-CM | POA: Diagnosis not present

## 2016-06-29 DIAGNOSIS — I70211 Atherosclerosis of native arteries of extremities with intermittent claudication, right leg: Secondary | ICD-10-CM | POA: Diagnosis not present

## 2016-06-29 DIAGNOSIS — I739 Peripheral vascular disease, unspecified: Secondary | ICD-10-CM | POA: Diagnosis not present

## 2016-06-29 DIAGNOSIS — I70201 Unspecified atherosclerosis of native arteries of extremities, right leg: Secondary | ICD-10-CM | POA: Diagnosis not present

## 2016-06-29 DIAGNOSIS — I771 Stricture of artery: Secondary | ICD-10-CM | POA: Diagnosis not present

## 2016-06-30 DIAGNOSIS — E119 Type 2 diabetes mellitus without complications: Secondary | ICD-10-CM | POA: Diagnosis not present

## 2016-06-30 DIAGNOSIS — I70209 Unspecified atherosclerosis of native arteries of extremities, unspecified extremity: Secondary | ICD-10-CM | POA: Diagnosis not present

## 2016-06-30 DIAGNOSIS — I2510 Atherosclerotic heart disease of native coronary artery without angina pectoris: Secondary | ICD-10-CM | POA: Diagnosis not present

## 2016-06-30 DIAGNOSIS — I10 Essential (primary) hypertension: Secondary | ICD-10-CM | POA: Diagnosis not present

## 2016-06-30 DIAGNOSIS — L97512 Non-pressure chronic ulcer of other part of right foot with fat layer exposed: Secondary | ICD-10-CM | POA: Diagnosis not present

## 2016-06-30 DIAGNOSIS — I998 Other disorder of circulatory system: Secondary | ICD-10-CM | POA: Diagnosis not present

## 2016-06-30 DIAGNOSIS — I70201 Unspecified atherosclerosis of native arteries of extremities, right leg: Secondary | ICD-10-CM | POA: Diagnosis not present

## 2016-07-10 ENCOUNTER — Encounter (HOSPITAL_BASED_OUTPATIENT_CLINIC_OR_DEPARTMENT_OTHER): Payer: Medicare Other | Attending: Internal Medicine

## 2016-07-10 DIAGNOSIS — L97511 Non-pressure chronic ulcer of other part of right foot limited to breakdown of skin: Secondary | ICD-10-CM | POA: Diagnosis not present

## 2016-07-10 DIAGNOSIS — M1990 Unspecified osteoarthritis, unspecified site: Secondary | ICD-10-CM | POA: Insufficient documentation

## 2016-07-10 DIAGNOSIS — E11621 Type 2 diabetes mellitus with foot ulcer: Secondary | ICD-10-CM | POA: Diagnosis not present

## 2016-07-10 DIAGNOSIS — Y835 Amputation of limb(s) as the cause of abnormal reaction of the patient, or of later complication, without mention of misadventure at the time of the procedure: Secondary | ICD-10-CM | POA: Diagnosis not present

## 2016-07-10 DIAGNOSIS — I2510 Atherosclerotic heart disease of native coronary artery without angina pectoris: Secondary | ICD-10-CM | POA: Insufficient documentation

## 2016-07-10 DIAGNOSIS — Z794 Long term (current) use of insulin: Secondary | ICD-10-CM | POA: Diagnosis not present

## 2016-07-10 DIAGNOSIS — L97519 Non-pressure chronic ulcer of other part of right foot with unspecified severity: Secondary | ICD-10-CM | POA: Insufficient documentation

## 2016-07-10 DIAGNOSIS — E1136 Type 2 diabetes mellitus with diabetic cataract: Secondary | ICD-10-CM | POA: Diagnosis not present

## 2016-07-10 DIAGNOSIS — E1140 Type 2 diabetes mellitus with diabetic neuropathy, unspecified: Secondary | ICD-10-CM | POA: Diagnosis not present

## 2016-07-10 DIAGNOSIS — Z89431 Acquired absence of right foot: Secondary | ICD-10-CM | POA: Insufficient documentation

## 2016-07-10 DIAGNOSIS — T8189XA Other complications of procedures, not elsewhere classified, initial encounter: Secondary | ICD-10-CM | POA: Insufficient documentation

## 2016-07-10 DIAGNOSIS — I10 Essential (primary) hypertension: Secondary | ICD-10-CM | POA: Insufficient documentation

## 2016-07-17 DIAGNOSIS — E11621 Type 2 diabetes mellitus with foot ulcer: Secondary | ICD-10-CM | POA: Diagnosis not present

## 2016-07-17 DIAGNOSIS — E1165 Type 2 diabetes mellitus with hyperglycemia: Secondary | ICD-10-CM | POA: Diagnosis not present

## 2016-07-17 DIAGNOSIS — Z794 Long term (current) use of insulin: Secondary | ICD-10-CM | POA: Diagnosis not present

## 2016-07-17 DIAGNOSIS — E782 Mixed hyperlipidemia: Secondary | ICD-10-CM | POA: Diagnosis not present

## 2016-07-17 DIAGNOSIS — L97519 Non-pressure chronic ulcer of other part of right foot with unspecified severity: Secondary | ICD-10-CM | POA: Diagnosis not present

## 2016-07-17 DIAGNOSIS — L97511 Non-pressure chronic ulcer of other part of right foot limited to breakdown of skin: Secondary | ICD-10-CM | POA: Diagnosis not present

## 2016-07-17 DIAGNOSIS — T8189XA Other complications of procedures, not elsewhere classified, initial encounter: Secondary | ICD-10-CM | POA: Diagnosis not present

## 2016-07-17 DIAGNOSIS — Z89431 Acquired absence of right foot: Secondary | ICD-10-CM | POA: Diagnosis not present

## 2016-07-17 DIAGNOSIS — I2510 Atherosclerotic heart disease of native coronary artery without angina pectoris: Secondary | ICD-10-CM | POA: Diagnosis not present

## 2016-07-24 DIAGNOSIS — E1165 Type 2 diabetes mellitus with hyperglycemia: Secondary | ICD-10-CM | POA: Diagnosis not present

## 2016-07-24 DIAGNOSIS — L97512 Non-pressure chronic ulcer of other part of right foot with fat layer exposed: Secondary | ICD-10-CM | POA: Diagnosis not present

## 2016-07-24 DIAGNOSIS — E11621 Type 2 diabetes mellitus with foot ulcer: Secondary | ICD-10-CM | POA: Diagnosis not present

## 2016-07-24 DIAGNOSIS — L97519 Non-pressure chronic ulcer of other part of right foot with unspecified severity: Secondary | ICD-10-CM | POA: Diagnosis not present

## 2016-07-24 DIAGNOSIS — Z794 Long term (current) use of insulin: Secondary | ICD-10-CM | POA: Diagnosis not present

## 2016-07-24 DIAGNOSIS — Z89431 Acquired absence of right foot: Secondary | ICD-10-CM | POA: Diagnosis not present

## 2016-07-24 DIAGNOSIS — Z23 Encounter for immunization: Secondary | ICD-10-CM | POA: Diagnosis not present

## 2016-07-24 DIAGNOSIS — T8189XA Other complications of procedures, not elsewhere classified, initial encounter: Secondary | ICD-10-CM | POA: Diagnosis not present

## 2016-07-24 DIAGNOSIS — I2510 Atherosclerotic heart disease of native coronary artery without angina pectoris: Secondary | ICD-10-CM | POA: Diagnosis not present

## 2016-07-24 DIAGNOSIS — I10 Essential (primary) hypertension: Secondary | ICD-10-CM | POA: Diagnosis not present

## 2016-07-24 DIAGNOSIS — Z79899 Other long term (current) drug therapy: Secondary | ICD-10-CM | POA: Diagnosis not present

## 2016-07-24 DIAGNOSIS — I70203 Unspecified atherosclerosis of native arteries of extremities, bilateral legs: Secondary | ICD-10-CM | POA: Diagnosis not present

## 2016-07-24 DIAGNOSIS — E782 Mixed hyperlipidemia: Secondary | ICD-10-CM | POA: Diagnosis not present

## 2016-07-24 DIAGNOSIS — E1142 Type 2 diabetes mellitus with diabetic polyneuropathy: Secondary | ICD-10-CM | POA: Diagnosis not present

## 2016-07-31 ENCOUNTER — Encounter (HOSPITAL_BASED_OUTPATIENT_CLINIC_OR_DEPARTMENT_OTHER): Payer: Medicare Other | Attending: Internal Medicine

## 2016-07-31 DIAGNOSIS — I2510 Atherosclerotic heart disease of native coronary artery without angina pectoris: Secondary | ICD-10-CM | POA: Diagnosis not present

## 2016-07-31 DIAGNOSIS — Y835 Amputation of limb(s) as the cause of abnormal reaction of the patient, or of later complication, without mention of misadventure at the time of the procedure: Secondary | ICD-10-CM | POA: Diagnosis not present

## 2016-07-31 DIAGNOSIS — E11621 Type 2 diabetes mellitus with foot ulcer: Secondary | ICD-10-CM | POA: Insufficient documentation

## 2016-07-31 DIAGNOSIS — L97518 Non-pressure chronic ulcer of other part of right foot with other specified severity: Secondary | ICD-10-CM | POA: Diagnosis not present

## 2016-07-31 DIAGNOSIS — E1140 Type 2 diabetes mellitus with diabetic neuropathy, unspecified: Secondary | ICD-10-CM | POA: Insufficient documentation

## 2016-07-31 DIAGNOSIS — Z89431 Acquired absence of right foot: Secondary | ICD-10-CM | POA: Insufficient documentation

## 2016-07-31 DIAGNOSIS — E1151 Type 2 diabetes mellitus with diabetic peripheral angiopathy without gangrene: Secondary | ICD-10-CM | POA: Diagnosis not present

## 2016-07-31 DIAGNOSIS — I10 Essential (primary) hypertension: Secondary | ICD-10-CM | POA: Insufficient documentation

## 2016-07-31 DIAGNOSIS — T8189XA Other complications of procedures, not elsewhere classified, initial encounter: Secondary | ICD-10-CM | POA: Diagnosis not present

## 2016-08-01 DIAGNOSIS — E1042 Type 1 diabetes mellitus with diabetic polyneuropathy: Secondary | ICD-10-CM | POA: Diagnosis not present

## 2016-08-07 DIAGNOSIS — Z89431 Acquired absence of right foot: Secondary | ICD-10-CM | POA: Diagnosis not present

## 2016-08-07 DIAGNOSIS — E11621 Type 2 diabetes mellitus with foot ulcer: Secondary | ICD-10-CM | POA: Diagnosis not present

## 2016-08-07 DIAGNOSIS — L97518 Non-pressure chronic ulcer of other part of right foot with other specified severity: Secondary | ICD-10-CM | POA: Diagnosis not present

## 2016-08-07 DIAGNOSIS — T8189XA Other complications of procedures, not elsewhere classified, initial encounter: Secondary | ICD-10-CM | POA: Diagnosis not present

## 2016-08-07 DIAGNOSIS — I2510 Atherosclerotic heart disease of native coronary artery without angina pectoris: Secondary | ICD-10-CM | POA: Diagnosis not present

## 2016-08-07 DIAGNOSIS — L97513 Non-pressure chronic ulcer of other part of right foot with necrosis of muscle: Secondary | ICD-10-CM | POA: Diagnosis not present

## 2016-08-07 DIAGNOSIS — I10 Essential (primary) hypertension: Secondary | ICD-10-CM | POA: Diagnosis not present

## 2016-08-14 DIAGNOSIS — E11621 Type 2 diabetes mellitus with foot ulcer: Secondary | ICD-10-CM | POA: Diagnosis not present

## 2016-08-14 DIAGNOSIS — I2510 Atherosclerotic heart disease of native coronary artery without angina pectoris: Secondary | ICD-10-CM | POA: Diagnosis not present

## 2016-08-14 DIAGNOSIS — L97518 Non-pressure chronic ulcer of other part of right foot with other specified severity: Secondary | ICD-10-CM | POA: Diagnosis not present

## 2016-08-14 DIAGNOSIS — I10 Essential (primary) hypertension: Secondary | ICD-10-CM | POA: Diagnosis not present

## 2016-08-14 DIAGNOSIS — T8189XA Other complications of procedures, not elsewhere classified, initial encounter: Secondary | ICD-10-CM | POA: Diagnosis not present

## 2016-08-14 DIAGNOSIS — Z89431 Acquired absence of right foot: Secondary | ICD-10-CM | POA: Diagnosis not present

## 2016-08-21 DIAGNOSIS — Z89431 Acquired absence of right foot: Secondary | ICD-10-CM | POA: Diagnosis not present

## 2016-08-21 DIAGNOSIS — T8189XA Other complications of procedures, not elsewhere classified, initial encounter: Secondary | ICD-10-CM | POA: Diagnosis not present

## 2016-08-21 DIAGNOSIS — I10 Essential (primary) hypertension: Secondary | ICD-10-CM | POA: Diagnosis not present

## 2016-08-21 DIAGNOSIS — L97518 Non-pressure chronic ulcer of other part of right foot with other specified severity: Secondary | ICD-10-CM | POA: Diagnosis not present

## 2016-08-21 DIAGNOSIS — I2510 Atherosclerotic heart disease of native coronary artery without angina pectoris: Secondary | ICD-10-CM | POA: Diagnosis not present

## 2016-08-21 DIAGNOSIS — L97511 Non-pressure chronic ulcer of other part of right foot limited to breakdown of skin: Secondary | ICD-10-CM | POA: Diagnosis not present

## 2016-08-21 DIAGNOSIS — E11621 Type 2 diabetes mellitus with foot ulcer: Secondary | ICD-10-CM | POA: Diagnosis not present

## 2016-08-28 DIAGNOSIS — E11621 Type 2 diabetes mellitus with foot ulcer: Secondary | ICD-10-CM | POA: Diagnosis not present

## 2016-08-28 DIAGNOSIS — T8189XA Other complications of procedures, not elsewhere classified, initial encounter: Secondary | ICD-10-CM | POA: Diagnosis not present

## 2016-08-28 DIAGNOSIS — L97518 Non-pressure chronic ulcer of other part of right foot with other specified severity: Secondary | ICD-10-CM | POA: Diagnosis not present

## 2016-08-28 DIAGNOSIS — Z89431 Acquired absence of right foot: Secondary | ICD-10-CM | POA: Diagnosis not present

## 2016-08-28 DIAGNOSIS — I10 Essential (primary) hypertension: Secondary | ICD-10-CM | POA: Diagnosis not present

## 2016-08-28 DIAGNOSIS — I2510 Atherosclerotic heart disease of native coronary artery without angina pectoris: Secondary | ICD-10-CM | POA: Diagnosis not present

## 2016-09-04 ENCOUNTER — Encounter (HOSPITAL_BASED_OUTPATIENT_CLINIC_OR_DEPARTMENT_OTHER): Payer: Medicare Other | Attending: Internal Medicine

## 2016-09-04 DIAGNOSIS — T8131XD Disruption of external operation (surgical) wound, not elsewhere classified, subsequent encounter: Secondary | ICD-10-CM | POA: Diagnosis not present

## 2016-09-04 DIAGNOSIS — Y839 Surgical procedure, unspecified as the cause of abnormal reaction of the patient, or of later complication, without mention of misadventure at the time of the procedure: Secondary | ICD-10-CM | POA: Diagnosis not present

## 2016-09-04 DIAGNOSIS — E1140 Type 2 diabetes mellitus with diabetic neuropathy, unspecified: Secondary | ICD-10-CM | POA: Diagnosis not present

## 2016-09-04 DIAGNOSIS — I10 Essential (primary) hypertension: Secondary | ICD-10-CM | POA: Insufficient documentation

## 2016-09-04 DIAGNOSIS — I2510 Atherosclerotic heart disease of native coronary artery without angina pectoris: Secondary | ICD-10-CM | POA: Diagnosis not present

## 2016-09-04 DIAGNOSIS — L97512 Non-pressure chronic ulcer of other part of right foot with fat layer exposed: Secondary | ICD-10-CM | POA: Diagnosis not present

## 2016-09-04 DIAGNOSIS — Z794 Long term (current) use of insulin: Secondary | ICD-10-CM | POA: Insufficient documentation

## 2016-09-04 DIAGNOSIS — M1990 Unspecified osteoarthritis, unspecified site: Secondary | ICD-10-CM | POA: Diagnosis not present

## 2016-09-04 DIAGNOSIS — I739 Peripheral vascular disease, unspecified: Secondary | ICD-10-CM | POA: Insufficient documentation

## 2016-09-04 DIAGNOSIS — E11621 Type 2 diabetes mellitus with foot ulcer: Secondary | ICD-10-CM | POA: Diagnosis not present

## 2016-09-04 DIAGNOSIS — T8131XA Disruption of external operation (surgical) wound, not elsewhere classified, initial encounter: Secondary | ICD-10-CM | POA: Diagnosis not present

## 2016-09-11 DIAGNOSIS — E1140 Type 2 diabetes mellitus with diabetic neuropathy, unspecified: Secondary | ICD-10-CM | POA: Diagnosis not present

## 2016-09-11 DIAGNOSIS — E11621 Type 2 diabetes mellitus with foot ulcer: Secondary | ICD-10-CM | POA: Diagnosis not present

## 2016-09-11 DIAGNOSIS — I2510 Atherosclerotic heart disease of native coronary artery without angina pectoris: Secondary | ICD-10-CM | POA: Diagnosis not present

## 2016-09-11 DIAGNOSIS — T8131XD Disruption of external operation (surgical) wound, not elsewhere classified, subsequent encounter: Secondary | ICD-10-CM | POA: Diagnosis not present

## 2016-09-11 DIAGNOSIS — L97512 Non-pressure chronic ulcer of other part of right foot with fat layer exposed: Secondary | ICD-10-CM | POA: Diagnosis not present

## 2016-09-11 DIAGNOSIS — M1990 Unspecified osteoarthritis, unspecified site: Secondary | ICD-10-CM | POA: Diagnosis not present

## 2016-09-18 DIAGNOSIS — E1140 Type 2 diabetes mellitus with diabetic neuropathy, unspecified: Secondary | ICD-10-CM | POA: Diagnosis not present

## 2016-09-18 DIAGNOSIS — L97512 Non-pressure chronic ulcer of other part of right foot with fat layer exposed: Secondary | ICD-10-CM | POA: Diagnosis not present

## 2016-09-18 DIAGNOSIS — I2510 Atherosclerotic heart disease of native coronary artery without angina pectoris: Secondary | ICD-10-CM | POA: Diagnosis not present

## 2016-09-18 DIAGNOSIS — M1990 Unspecified osteoarthritis, unspecified site: Secondary | ICD-10-CM | POA: Diagnosis not present

## 2016-09-18 DIAGNOSIS — T8131XD Disruption of external operation (surgical) wound, not elsewhere classified, subsequent encounter: Secondary | ICD-10-CM | POA: Diagnosis not present

## 2016-09-18 DIAGNOSIS — E11621 Type 2 diabetes mellitus with foot ulcer: Secondary | ICD-10-CM | POA: Diagnosis not present

## 2016-09-19 DIAGNOSIS — E118 Type 2 diabetes mellitus with unspecified complications: Secondary | ICD-10-CM | POA: Diagnosis not present

## 2016-09-19 DIAGNOSIS — I998 Other disorder of circulatory system: Secondary | ICD-10-CM | POA: Diagnosis not present

## 2016-09-19 DIAGNOSIS — E782 Mixed hyperlipidemia: Secondary | ICD-10-CM | POA: Diagnosis not present

## 2016-09-19 DIAGNOSIS — E785 Hyperlipidemia, unspecified: Secondary | ICD-10-CM | POA: Diagnosis not present

## 2016-09-19 DIAGNOSIS — I2510 Atherosclerotic heart disease of native coronary artery without angina pectoris: Secondary | ICD-10-CM | POA: Diagnosis not present

## 2016-09-19 DIAGNOSIS — I10 Essential (primary) hypertension: Secondary | ICD-10-CM | POA: Diagnosis not present

## 2016-09-19 DIAGNOSIS — I771 Stricture of artery: Secondary | ICD-10-CM | POA: Diagnosis not present

## 2016-09-19 DIAGNOSIS — I739 Peripheral vascular disease, unspecified: Secondary | ICD-10-CM | POA: Diagnosis not present

## 2016-09-19 DIAGNOSIS — E1165 Type 2 diabetes mellitus with hyperglycemia: Secondary | ICD-10-CM | POA: Diagnosis not present

## 2016-09-25 DIAGNOSIS — E11621 Type 2 diabetes mellitus with foot ulcer: Secondary | ICD-10-CM | POA: Diagnosis not present

## 2016-09-25 DIAGNOSIS — M1990 Unspecified osteoarthritis, unspecified site: Secondary | ICD-10-CM | POA: Diagnosis not present

## 2016-09-25 DIAGNOSIS — L97512 Non-pressure chronic ulcer of other part of right foot with fat layer exposed: Secondary | ICD-10-CM | POA: Diagnosis not present

## 2016-09-25 DIAGNOSIS — T8131XD Disruption of external operation (surgical) wound, not elsewhere classified, subsequent encounter: Secondary | ICD-10-CM | POA: Diagnosis not present

## 2016-09-25 DIAGNOSIS — E1140 Type 2 diabetes mellitus with diabetic neuropathy, unspecified: Secondary | ICD-10-CM | POA: Diagnosis not present

## 2016-09-25 DIAGNOSIS — I2510 Atherosclerotic heart disease of native coronary artery without angina pectoris: Secondary | ICD-10-CM | POA: Diagnosis not present

## 2016-09-26 DIAGNOSIS — Z794 Long term (current) use of insulin: Secondary | ICD-10-CM | POA: Diagnosis not present

## 2016-09-26 DIAGNOSIS — E782 Mixed hyperlipidemia: Secondary | ICD-10-CM | POA: Diagnosis not present

## 2016-09-26 DIAGNOSIS — E1165 Type 2 diabetes mellitus with hyperglycemia: Secondary | ICD-10-CM | POA: Diagnosis not present

## 2016-09-26 DIAGNOSIS — I2510 Atherosclerotic heart disease of native coronary artery without angina pectoris: Secondary | ICD-10-CM | POA: Diagnosis not present

## 2016-09-26 DIAGNOSIS — I70203 Unspecified atherosclerosis of native arteries of extremities, bilateral legs: Secondary | ICD-10-CM | POA: Diagnosis not present

## 2016-09-26 DIAGNOSIS — I10 Essential (primary) hypertension: Secondary | ICD-10-CM | POA: Diagnosis not present

## 2016-09-26 DIAGNOSIS — E1142 Type 2 diabetes mellitus with diabetic polyneuropathy: Secondary | ICD-10-CM | POA: Diagnosis not present

## 2016-09-26 DIAGNOSIS — Z79899 Other long term (current) drug therapy: Secondary | ICD-10-CM | POA: Diagnosis not present

## 2016-10-02 ENCOUNTER — Encounter (HOSPITAL_BASED_OUTPATIENT_CLINIC_OR_DEPARTMENT_OTHER): Payer: Medicare Other | Attending: Internal Medicine

## 2016-10-02 DIAGNOSIS — E11621 Type 2 diabetes mellitus with foot ulcer: Secondary | ICD-10-CM | POA: Diagnosis not present

## 2016-10-02 DIAGNOSIS — L97512 Non-pressure chronic ulcer of other part of right foot with fat layer exposed: Secondary | ICD-10-CM | POA: Diagnosis not present

## 2016-10-02 DIAGNOSIS — Z89431 Acquired absence of right foot: Secondary | ICD-10-CM | POA: Insufficient documentation

## 2016-10-02 DIAGNOSIS — I10 Essential (primary) hypertension: Secondary | ICD-10-CM | POA: Diagnosis not present

## 2016-10-02 DIAGNOSIS — I2510 Atherosclerotic heart disease of native coronary artery without angina pectoris: Secondary | ICD-10-CM | POA: Diagnosis not present

## 2016-10-02 DIAGNOSIS — L97511 Non-pressure chronic ulcer of other part of right foot limited to breakdown of skin: Secondary | ICD-10-CM | POA: Insufficient documentation

## 2016-10-02 DIAGNOSIS — Y835 Amputation of limb(s) as the cause of abnormal reaction of the patient, or of later complication, without mention of misadventure at the time of the procedure: Secondary | ICD-10-CM | POA: Insufficient documentation

## 2016-10-02 DIAGNOSIS — E1140 Type 2 diabetes mellitus with diabetic neuropathy, unspecified: Secondary | ICD-10-CM | POA: Insufficient documentation

## 2016-10-02 DIAGNOSIS — T8189XA Other complications of procedures, not elsewhere classified, initial encounter: Secondary | ICD-10-CM | POA: Diagnosis not present

## 2016-10-02 DIAGNOSIS — B9562 Methicillin resistant Staphylococcus aureus infection as the cause of diseases classified elsewhere: Secondary | ICD-10-CM | POA: Diagnosis not present

## 2016-10-02 DIAGNOSIS — T8781 Dehiscence of amputation stump: Secondary | ICD-10-CM | POA: Insufficient documentation

## 2016-10-09 DIAGNOSIS — L97511 Non-pressure chronic ulcer of other part of right foot limited to breakdown of skin: Secondary | ICD-10-CM | POA: Diagnosis not present

## 2016-10-09 DIAGNOSIS — T8781 Dehiscence of amputation stump: Secondary | ICD-10-CM | POA: Diagnosis not present

## 2016-10-09 DIAGNOSIS — I2510 Atherosclerotic heart disease of native coronary artery without angina pectoris: Secondary | ICD-10-CM | POA: Diagnosis not present

## 2016-10-09 DIAGNOSIS — B9562 Methicillin resistant Staphylococcus aureus infection as the cause of diseases classified elsewhere: Secondary | ICD-10-CM | POA: Diagnosis not present

## 2016-10-09 DIAGNOSIS — E11621 Type 2 diabetes mellitus with foot ulcer: Secondary | ICD-10-CM | POA: Diagnosis not present

## 2016-10-09 DIAGNOSIS — L97512 Non-pressure chronic ulcer of other part of right foot with fat layer exposed: Secondary | ICD-10-CM | POA: Diagnosis not present

## 2016-10-09 DIAGNOSIS — I10 Essential (primary) hypertension: Secondary | ICD-10-CM | POA: Diagnosis not present

## 2016-10-16 DIAGNOSIS — L97512 Non-pressure chronic ulcer of other part of right foot with fat layer exposed: Secondary | ICD-10-CM | POA: Diagnosis not present

## 2016-10-16 DIAGNOSIS — T8781 Dehiscence of amputation stump: Secondary | ICD-10-CM | POA: Diagnosis not present

## 2016-10-16 DIAGNOSIS — I2510 Atherosclerotic heart disease of native coronary artery without angina pectoris: Secondary | ICD-10-CM | POA: Diagnosis not present

## 2016-10-16 DIAGNOSIS — B9562 Methicillin resistant Staphylococcus aureus infection as the cause of diseases classified elsewhere: Secondary | ICD-10-CM | POA: Diagnosis not present

## 2016-10-16 DIAGNOSIS — E11621 Type 2 diabetes mellitus with foot ulcer: Secondary | ICD-10-CM | POA: Diagnosis not present

## 2016-10-16 DIAGNOSIS — I10 Essential (primary) hypertension: Secondary | ICD-10-CM | POA: Diagnosis not present

## 2016-10-16 DIAGNOSIS — L97511 Non-pressure chronic ulcer of other part of right foot limited to breakdown of skin: Secondary | ICD-10-CM | POA: Diagnosis not present

## 2016-10-16 DIAGNOSIS — T8131XD Disruption of external operation (surgical) wound, not elsewhere classified, subsequent encounter: Secondary | ICD-10-CM | POA: Diagnosis not present

## 2016-10-17 DIAGNOSIS — L603 Nail dystrophy: Secondary | ICD-10-CM | POA: Diagnosis not present

## 2016-10-17 DIAGNOSIS — E1051 Type 1 diabetes mellitus with diabetic peripheral angiopathy without gangrene: Secondary | ICD-10-CM | POA: Diagnosis not present

## 2016-10-17 DIAGNOSIS — I739 Peripheral vascular disease, unspecified: Secondary | ICD-10-CM | POA: Diagnosis not present

## 2016-10-20 DIAGNOSIS — T8131XD Disruption of external operation (surgical) wound, not elsewhere classified, subsequent encounter: Secondary | ICD-10-CM | POA: Diagnosis not present

## 2016-10-20 DIAGNOSIS — T8781 Dehiscence of amputation stump: Secondary | ICD-10-CM | POA: Diagnosis not present

## 2016-10-20 DIAGNOSIS — E11621 Type 2 diabetes mellitus with foot ulcer: Secondary | ICD-10-CM | POA: Diagnosis not present

## 2016-10-20 DIAGNOSIS — L97512 Non-pressure chronic ulcer of other part of right foot with fat layer exposed: Secondary | ICD-10-CM | POA: Diagnosis not present

## 2016-10-20 DIAGNOSIS — B9562 Methicillin resistant Staphylococcus aureus infection as the cause of diseases classified elsewhere: Secondary | ICD-10-CM | POA: Diagnosis not present

## 2016-10-20 DIAGNOSIS — I10 Essential (primary) hypertension: Secondary | ICD-10-CM | POA: Diagnosis not present

## 2016-10-20 DIAGNOSIS — L97511 Non-pressure chronic ulcer of other part of right foot limited to breakdown of skin: Secondary | ICD-10-CM | POA: Diagnosis not present

## 2016-10-20 DIAGNOSIS — I2510 Atherosclerotic heart disease of native coronary artery without angina pectoris: Secondary | ICD-10-CM | POA: Diagnosis not present

## 2016-10-27 DIAGNOSIS — I10 Essential (primary) hypertension: Secondary | ICD-10-CM | POA: Diagnosis not present

## 2016-10-27 DIAGNOSIS — T8131XD Disruption of external operation (surgical) wound, not elsewhere classified, subsequent encounter: Secondary | ICD-10-CM | POA: Diagnosis not present

## 2016-10-27 DIAGNOSIS — E11621 Type 2 diabetes mellitus with foot ulcer: Secondary | ICD-10-CM | POA: Diagnosis not present

## 2016-10-27 DIAGNOSIS — L97511 Non-pressure chronic ulcer of other part of right foot limited to breakdown of skin: Secondary | ICD-10-CM | POA: Diagnosis not present

## 2016-10-27 DIAGNOSIS — B9562 Methicillin resistant Staphylococcus aureus infection as the cause of diseases classified elsewhere: Secondary | ICD-10-CM | POA: Diagnosis not present

## 2016-10-27 DIAGNOSIS — I2510 Atherosclerotic heart disease of native coronary artery without angina pectoris: Secondary | ICD-10-CM | POA: Diagnosis not present

## 2016-10-27 DIAGNOSIS — T8781 Dehiscence of amputation stump: Secondary | ICD-10-CM | POA: Diagnosis not present

## 2016-10-27 DIAGNOSIS — M86079 Acute hematogenous osteomyelitis, unspecified ankle and foot: Secondary | ICD-10-CM | POA: Diagnosis not present

## 2016-11-03 ENCOUNTER — Encounter (HOSPITAL_BASED_OUTPATIENT_CLINIC_OR_DEPARTMENT_OTHER): Payer: Self-pay

## 2016-11-03 ENCOUNTER — Encounter (HOSPITAL_BASED_OUTPATIENT_CLINIC_OR_DEPARTMENT_OTHER): Payer: Medicare Other | Attending: Internal Medicine

## 2016-11-03 DIAGNOSIS — L97511 Non-pressure chronic ulcer of other part of right foot limited to breakdown of skin: Secondary | ICD-10-CM | POA: Insufficient documentation

## 2016-11-03 DIAGNOSIS — Y835 Amputation of limb(s) as the cause of abnormal reaction of the patient, or of later complication, without mention of misadventure at the time of the procedure: Secondary | ICD-10-CM | POA: Insufficient documentation

## 2016-11-03 DIAGNOSIS — E11621 Type 2 diabetes mellitus with foot ulcer: Secondary | ICD-10-CM | POA: Insufficient documentation

## 2016-11-03 DIAGNOSIS — M86071 Acute hematogenous osteomyelitis, right ankle and foot: Secondary | ICD-10-CM | POA: Insufficient documentation

## 2016-11-03 DIAGNOSIS — T8189XA Other complications of procedures, not elsewhere classified, initial encounter: Secondary | ICD-10-CM | POA: Insufficient documentation

## 2016-11-03 DIAGNOSIS — T8131XD Disruption of external operation (surgical) wound, not elsewhere classified, subsequent encounter: Secondary | ICD-10-CM | POA: Diagnosis not present

## 2016-11-10 DIAGNOSIS — E1165 Type 2 diabetes mellitus with hyperglycemia: Secondary | ICD-10-CM | POA: Diagnosis not present

## 2016-11-10 DIAGNOSIS — Z79899 Other long term (current) drug therapy: Secondary | ICD-10-CM | POA: Diagnosis not present

## 2016-11-10 DIAGNOSIS — B9562 Methicillin resistant Staphylococcus aureus infection as the cause of diseases classified elsewhere: Secondary | ICD-10-CM | POA: Diagnosis not present

## 2016-11-10 DIAGNOSIS — Z794 Long term (current) use of insulin: Secondary | ICD-10-CM | POA: Diagnosis not present

## 2016-11-10 DIAGNOSIS — M86171 Other acute osteomyelitis, right ankle and foot: Secondary | ICD-10-CM | POA: Diagnosis not present

## 2016-11-10 DIAGNOSIS — I739 Peripheral vascular disease, unspecified: Secondary | ICD-10-CM | POA: Diagnosis not present

## 2016-11-13 DIAGNOSIS — M86071 Acute hematogenous osteomyelitis, right ankle and foot: Secondary | ICD-10-CM | POA: Diagnosis not present

## 2016-11-13 DIAGNOSIS — L97511 Non-pressure chronic ulcer of other part of right foot limited to breakdown of skin: Secondary | ICD-10-CM | POA: Diagnosis not present

## 2016-11-13 DIAGNOSIS — T8189XA Other complications of procedures, not elsewhere classified, initial encounter: Secondary | ICD-10-CM | POA: Diagnosis not present

## 2016-11-13 DIAGNOSIS — E11621 Type 2 diabetes mellitus with foot ulcer: Secondary | ICD-10-CM | POA: Diagnosis not present

## 2016-11-13 DIAGNOSIS — M86079 Acute hematogenous osteomyelitis, unspecified ankle and foot: Secondary | ICD-10-CM | POA: Diagnosis not present

## 2016-11-14 ENCOUNTER — Other Ambulatory Visit (HOSPITAL_COMMUNITY): Payer: Self-pay | Admitting: Internal Medicine

## 2016-11-14 DIAGNOSIS — I10 Essential (primary) hypertension: Secondary | ICD-10-CM | POA: Diagnosis not present

## 2016-11-14 DIAGNOSIS — T148XXA Other injury of unspecified body region, initial encounter: Secondary | ICD-10-CM | POA: Diagnosis not present

## 2016-11-14 DIAGNOSIS — M86171 Other acute osteomyelitis, right ankle and foot: Secondary | ICD-10-CM

## 2016-11-14 DIAGNOSIS — I739 Peripheral vascular disease, unspecified: Secondary | ICD-10-CM | POA: Diagnosis not present

## 2016-11-14 DIAGNOSIS — I998 Other disorder of circulatory system: Secondary | ICD-10-CM | POA: Diagnosis not present

## 2016-11-17 DIAGNOSIS — E11621 Type 2 diabetes mellitus with foot ulcer: Secondary | ICD-10-CM | POA: Diagnosis not present

## 2016-11-17 DIAGNOSIS — B9562 Methicillin resistant Staphylococcus aureus infection as the cause of diseases classified elsewhere: Secondary | ICD-10-CM | POA: Diagnosis not present

## 2016-11-17 DIAGNOSIS — M868X7 Other osteomyelitis, ankle and foot: Secondary | ICD-10-CM | POA: Diagnosis not present

## 2016-11-17 DIAGNOSIS — I10 Essential (primary) hypertension: Secondary | ICD-10-CM | POA: Diagnosis not present

## 2016-11-17 DIAGNOSIS — L97519 Non-pressure chronic ulcer of other part of right foot with unspecified severity: Secondary | ICD-10-CM | POA: Diagnosis not present

## 2016-11-17 DIAGNOSIS — E1165 Type 2 diabetes mellitus with hyperglycemia: Secondary | ICD-10-CM | POA: Diagnosis not present

## 2016-11-17 DIAGNOSIS — Z792 Long term (current) use of antibiotics: Secondary | ICD-10-CM | POA: Diagnosis not present

## 2016-11-17 DIAGNOSIS — Z79899 Other long term (current) drug therapy: Secondary | ICD-10-CM | POA: Diagnosis not present

## 2016-11-17 DIAGNOSIS — I2510 Atherosclerotic heart disease of native coronary artery without angina pectoris: Secondary | ICD-10-CM | POA: Diagnosis not present

## 2016-11-17 DIAGNOSIS — E785 Hyperlipidemia, unspecified: Secondary | ICD-10-CM | POA: Diagnosis not present

## 2016-11-17 DIAGNOSIS — Z7984 Long term (current) use of oral hypoglycemic drugs: Secondary | ICD-10-CM | POA: Diagnosis not present

## 2016-11-17 DIAGNOSIS — M86171 Other acute osteomyelitis, right ankle and foot: Secondary | ICD-10-CM | POA: Diagnosis not present

## 2016-11-17 DIAGNOSIS — Z0000 Encounter for general adult medical examination without abnormal findings: Secondary | ICD-10-CM | POA: Diagnosis not present

## 2016-11-17 DIAGNOSIS — I739 Peripheral vascular disease, unspecified: Secondary | ICD-10-CM | POA: Diagnosis not present

## 2016-11-17 DIAGNOSIS — M869 Osteomyelitis, unspecified: Secondary | ICD-10-CM | POA: Diagnosis not present

## 2016-11-18 DIAGNOSIS — E1169 Type 2 diabetes mellitus with other specified complication: Secondary | ICD-10-CM | POA: Diagnosis not present

## 2016-11-18 DIAGNOSIS — E1151 Type 2 diabetes mellitus with diabetic peripheral angiopathy without gangrene: Secondary | ICD-10-CM | POA: Diagnosis not present

## 2016-11-20 ENCOUNTER — Ambulatory Visit (HOSPITAL_COMMUNITY): Payer: Medicare Other

## 2016-11-20 DIAGNOSIS — E1151 Type 2 diabetes mellitus with diabetic peripheral angiopathy without gangrene: Secondary | ICD-10-CM | POA: Diagnosis not present

## 2016-11-20 DIAGNOSIS — M869 Osteomyelitis, unspecified: Secondary | ICD-10-CM | POA: Diagnosis not present

## 2016-11-20 DIAGNOSIS — Z87442 Personal history of urinary calculi: Secondary | ICD-10-CM | POA: Diagnosis not present

## 2016-11-20 DIAGNOSIS — I70211 Atherosclerosis of native arteries of extremities with intermittent claudication, right leg: Secondary | ICD-10-CM | POA: Diagnosis not present

## 2016-11-20 DIAGNOSIS — I771 Stricture of artery: Secondary | ICD-10-CM | POA: Diagnosis not present

## 2016-11-20 DIAGNOSIS — E785 Hyperlipidemia, unspecified: Secondary | ICD-10-CM | POA: Diagnosis not present

## 2016-11-20 DIAGNOSIS — Z7984 Long term (current) use of oral hypoglycemic drugs: Secondary | ICD-10-CM | POA: Diagnosis not present

## 2016-11-20 DIAGNOSIS — I2510 Atherosclerotic heart disease of native coronary artery without angina pectoris: Secondary | ICD-10-CM | POA: Diagnosis not present

## 2016-11-20 DIAGNOSIS — L97514 Non-pressure chronic ulcer of other part of right foot with necrosis of bone: Secondary | ICD-10-CM | POA: Diagnosis not present

## 2016-11-20 DIAGNOSIS — Z951 Presence of aortocoronary bypass graft: Secondary | ICD-10-CM | POA: Diagnosis not present

## 2016-11-20 DIAGNOSIS — Z89421 Acquired absence of other right toe(s): Secondary | ICD-10-CM | POA: Diagnosis not present

## 2016-11-20 DIAGNOSIS — I10 Essential (primary) hypertension: Secondary | ICD-10-CM | POA: Diagnosis not present

## 2016-11-20 DIAGNOSIS — Z7982 Long term (current) use of aspirin: Secondary | ICD-10-CM | POA: Diagnosis not present

## 2016-11-20 DIAGNOSIS — I70235 Atherosclerosis of native arteries of right leg with ulceration of other part of foot: Secondary | ICD-10-CM | POA: Diagnosis not present

## 2016-11-20 DIAGNOSIS — I739 Peripheral vascular disease, unspecified: Secondary | ICD-10-CM | POA: Diagnosis not present

## 2016-11-20 DIAGNOSIS — Z7902 Long term (current) use of antithrombotics/antiplatelets: Secondary | ICD-10-CM | POA: Diagnosis not present

## 2016-11-20 DIAGNOSIS — I7092 Chronic total occlusion of artery of the extremities: Secondary | ICD-10-CM | POA: Diagnosis not present

## 2016-11-21 DIAGNOSIS — M869 Osteomyelitis, unspecified: Secondary | ICD-10-CM | POA: Diagnosis not present

## 2016-11-21 DIAGNOSIS — I7092 Chronic total occlusion of artery of the extremities: Secondary | ICD-10-CM | POA: Diagnosis not present

## 2016-11-21 DIAGNOSIS — I70235 Atherosclerosis of native arteries of right leg with ulceration of other part of foot: Secondary | ICD-10-CM | POA: Diagnosis not present

## 2016-11-21 DIAGNOSIS — I2510 Atherosclerotic heart disease of native coronary artery without angina pectoris: Secondary | ICD-10-CM | POA: Diagnosis not present

## 2016-11-21 DIAGNOSIS — I998 Other disorder of circulatory system: Secondary | ICD-10-CM | POA: Diagnosis not present

## 2016-11-21 DIAGNOSIS — E1151 Type 2 diabetes mellitus with diabetic peripheral angiopathy without gangrene: Secondary | ICD-10-CM | POA: Diagnosis not present

## 2016-11-21 DIAGNOSIS — L97514 Non-pressure chronic ulcer of other part of right foot with necrosis of bone: Secondary | ICD-10-CM | POA: Diagnosis not present

## 2016-11-22 ENCOUNTER — Ambulatory Visit (HOSPITAL_COMMUNITY)
Admission: RE | Admit: 2016-11-22 | Discharge: 2016-11-22 | Disposition: A | Discharge status: Home / Self Care | Payer: Medicare Other | Source: Ambulatory Visit | Attending: Internal Medicine | Admitting: Internal Medicine

## 2016-11-22 DIAGNOSIS — L03115 Cellulitis of right lower limb: Secondary | ICD-10-CM | POA: Insufficient documentation

## 2016-11-22 DIAGNOSIS — M86171 Other acute osteomyelitis, right ankle and foot: Secondary | ICD-10-CM | POA: Diagnosis not present

## 2016-11-22 DIAGNOSIS — E1169 Type 2 diabetes mellitus with other specified complication: Secondary | ICD-10-CM | POA: Diagnosis not present

## 2016-11-22 DIAGNOSIS — M67873 Other specified disorders of tendon, right ankle and foot: Secondary | ICD-10-CM | POA: Diagnosis not present

## 2016-11-22 DIAGNOSIS — M7731 Calcaneal spur, right foot: Secondary | ICD-10-CM | POA: Insufficient documentation

## 2016-11-22 DIAGNOSIS — E1151 Type 2 diabetes mellitus with diabetic peripheral angiopathy without gangrene: Secondary | ICD-10-CM | POA: Diagnosis not present

## 2016-11-22 MED ORDER — GADOBENATE DIMEGLUMINE 529 MG/ML IV SOLN
20.0000 mL | Freq: Once | INTRAVENOUS | Status: AC | PRN
  Administered 2016-11-22: 20 mL via INTRAVENOUS

## 2016-11-23 DIAGNOSIS — T8189XA Other complications of procedures, not elsewhere classified, initial encounter: Secondary | ICD-10-CM | POA: Diagnosis not present

## 2016-11-23 DIAGNOSIS — M8668 Other chronic osteomyelitis, other site: Secondary | ICD-10-CM | POA: Diagnosis not present

## 2016-11-23 DIAGNOSIS — L97511 Non-pressure chronic ulcer of other part of right foot limited to breakdown of skin: Secondary | ICD-10-CM | POA: Diagnosis not present

## 2016-11-23 DIAGNOSIS — M86071 Acute hematogenous osteomyelitis, right ankle and foot: Secondary | ICD-10-CM | POA: Diagnosis not present

## 2016-11-23 DIAGNOSIS — B9562 Methicillin resistant Staphylococcus aureus infection as the cause of diseases classified elsewhere: Secondary | ICD-10-CM | POA: Diagnosis not present

## 2016-11-23 DIAGNOSIS — E11621 Type 2 diabetes mellitus with foot ulcer: Secondary | ICD-10-CM | POA: Diagnosis not present

## 2016-11-23 DIAGNOSIS — T8131XD Disruption of external operation (surgical) wound, not elsewhere classified, subsequent encounter: Secondary | ICD-10-CM | POA: Diagnosis not present

## 2016-11-28 DIAGNOSIS — E1169 Type 2 diabetes mellitus with other specified complication: Secondary | ICD-10-CM | POA: Diagnosis not present

## 2016-11-28 DIAGNOSIS — E1151 Type 2 diabetes mellitus with diabetic peripheral angiopathy without gangrene: Secondary | ICD-10-CM | POA: Diagnosis not present

## 2016-11-28 DIAGNOSIS — M86171 Other acute osteomyelitis, right ankle and foot: Secondary | ICD-10-CM | POA: Diagnosis not present

## 2016-11-29 DIAGNOSIS — E11621 Type 2 diabetes mellitus with foot ulcer: Secondary | ICD-10-CM | POA: Diagnosis not present

## 2016-11-29 DIAGNOSIS — Z4781 Encounter for orthopedic aftercare following surgical amputation: Secondary | ICD-10-CM | POA: Diagnosis not present

## 2016-11-29 DIAGNOSIS — L97511 Non-pressure chronic ulcer of other part of right foot limited to breakdown of skin: Secondary | ICD-10-CM | POA: Diagnosis not present

## 2016-11-30 ENCOUNTER — Encounter (HOSPITAL_BASED_OUTPATIENT_CLINIC_OR_DEPARTMENT_OTHER): Payer: Medicare Other | Attending: Internal Medicine

## 2016-11-30 DIAGNOSIS — M86671 Other chronic osteomyelitis, right ankle and foot: Secondary | ICD-10-CM | POA: Insufficient documentation

## 2016-11-30 DIAGNOSIS — I2510 Atherosclerotic heart disease of native coronary artery without angina pectoris: Secondary | ICD-10-CM | POA: Diagnosis not present

## 2016-11-30 DIAGNOSIS — E11621 Type 2 diabetes mellitus with foot ulcer: Secondary | ICD-10-CM | POA: Insufficient documentation

## 2016-11-30 DIAGNOSIS — E1169 Type 2 diabetes mellitus with other specified complication: Secondary | ICD-10-CM | POA: Insufficient documentation

## 2016-11-30 DIAGNOSIS — I10 Essential (primary) hypertension: Secondary | ICD-10-CM | POA: Insufficient documentation

## 2016-11-30 DIAGNOSIS — E1151 Type 2 diabetes mellitus with diabetic peripheral angiopathy without gangrene: Secondary | ICD-10-CM | POA: Insufficient documentation

## 2016-11-30 DIAGNOSIS — L97512 Non-pressure chronic ulcer of other part of right foot with fat layer exposed: Secondary | ICD-10-CM | POA: Insufficient documentation

## 2016-11-30 DIAGNOSIS — Y835 Amputation of limb(s) as the cause of abnormal reaction of the patient, or of later complication, without mention of misadventure at the time of the procedure: Secondary | ICD-10-CM | POA: Insufficient documentation

## 2016-11-30 DIAGNOSIS — T8189XA Other complications of procedures, not elsewhere classified, initial encounter: Secondary | ICD-10-CM | POA: Insufficient documentation

## 2016-11-30 DIAGNOSIS — B9562 Methicillin resistant Staphylococcus aureus infection as the cause of diseases classified elsewhere: Secondary | ICD-10-CM | POA: Insufficient documentation

## 2016-11-30 DIAGNOSIS — E1140 Type 2 diabetes mellitus with diabetic neuropathy, unspecified: Secondary | ICD-10-CM | POA: Diagnosis not present

## 2016-12-05 DIAGNOSIS — E1151 Type 2 diabetes mellitus with diabetic peripheral angiopathy without gangrene: Secondary | ICD-10-CM | POA: Diagnosis not present

## 2016-12-05 DIAGNOSIS — E1169 Type 2 diabetes mellitus with other specified complication: Secondary | ICD-10-CM | POA: Diagnosis not present

## 2016-12-05 DIAGNOSIS — B9562 Methicillin resistant Staphylococcus aureus infection as the cause of diseases classified elsewhere: Secondary | ICD-10-CM | POA: Diagnosis not present

## 2016-12-05 DIAGNOSIS — M86171 Other acute osteomyelitis, right ankle and foot: Secondary | ICD-10-CM | POA: Diagnosis not present

## 2016-12-07 DIAGNOSIS — L97512 Non-pressure chronic ulcer of other part of right foot with fat layer exposed: Secondary | ICD-10-CM | POA: Diagnosis not present

## 2016-12-07 DIAGNOSIS — E1169 Type 2 diabetes mellitus with other specified complication: Secondary | ICD-10-CM | POA: Diagnosis not present

## 2016-12-07 DIAGNOSIS — B9562 Methicillin resistant Staphylococcus aureus infection as the cause of diseases classified elsewhere: Secondary | ICD-10-CM | POA: Diagnosis not present

## 2016-12-07 DIAGNOSIS — E11621 Type 2 diabetes mellitus with foot ulcer: Secondary | ICD-10-CM | POA: Diagnosis not present

## 2016-12-07 DIAGNOSIS — M86671 Other chronic osteomyelitis, right ankle and foot: Secondary | ICD-10-CM | POA: Diagnosis not present

## 2016-12-07 DIAGNOSIS — T8189XA Other complications of procedures, not elsewhere classified, initial encounter: Secondary | ICD-10-CM | POA: Diagnosis not present

## 2016-12-07 DIAGNOSIS — M8668 Other chronic osteomyelitis, other site: Secondary | ICD-10-CM | POA: Diagnosis not present

## 2016-12-08 DIAGNOSIS — E1169 Type 2 diabetes mellitus with other specified complication: Secondary | ICD-10-CM | POA: Diagnosis not present

## 2016-12-08 DIAGNOSIS — E1151 Type 2 diabetes mellitus with diabetic peripheral angiopathy without gangrene: Secondary | ICD-10-CM | POA: Diagnosis not present

## 2016-12-08 DIAGNOSIS — M86171 Other acute osteomyelitis, right ankle and foot: Secondary | ICD-10-CM | POA: Diagnosis not present

## 2016-12-08 DIAGNOSIS — B9562 Methicillin resistant Staphylococcus aureus infection as the cause of diseases classified elsewhere: Secondary | ICD-10-CM | POA: Diagnosis not present

## 2016-12-08 DIAGNOSIS — Z7902 Long term (current) use of antithrombotics/antiplatelets: Secondary | ICD-10-CM | POA: Diagnosis not present

## 2016-12-08 DIAGNOSIS — Z452 Encounter for adjustment and management of vascular access device: Secondary | ICD-10-CM | POA: Diagnosis not present

## 2016-12-08 DIAGNOSIS — Z792 Long term (current) use of antibiotics: Secondary | ICD-10-CM | POA: Diagnosis not present

## 2016-12-08 DIAGNOSIS — Z5181 Encounter for therapeutic drug level monitoring: Secondary | ICD-10-CM | POA: Diagnosis not present

## 2016-12-11 DIAGNOSIS — M86671 Other chronic osteomyelitis, right ankle and foot: Secondary | ICD-10-CM | POA: Diagnosis not present

## 2016-12-11 DIAGNOSIS — E1169 Type 2 diabetes mellitus with other specified complication: Secondary | ICD-10-CM | POA: Diagnosis not present

## 2016-12-11 DIAGNOSIS — T8189XA Other complications of procedures, not elsewhere classified, initial encounter: Secondary | ICD-10-CM | POA: Diagnosis not present

## 2016-12-11 DIAGNOSIS — E11621 Type 2 diabetes mellitus with foot ulcer: Secondary | ICD-10-CM | POA: Diagnosis not present

## 2016-12-11 DIAGNOSIS — M8668 Other chronic osteomyelitis, other site: Secondary | ICD-10-CM | POA: Diagnosis not present

## 2016-12-11 DIAGNOSIS — B9562 Methicillin resistant Staphylococcus aureus infection as the cause of diseases classified elsewhere: Secondary | ICD-10-CM | POA: Diagnosis not present

## 2016-12-11 DIAGNOSIS — L97512 Non-pressure chronic ulcer of other part of right foot with fat layer exposed: Secondary | ICD-10-CM | POA: Diagnosis not present

## 2016-12-11 LAB — GLUCOSE, CAPILLARY
GLUCOSE-CAPILLARY: 157 mg/dL — AB (ref 65–99)
Glucose-Capillary: 108 mg/dL — ABNORMAL HIGH (ref 65–99)

## 2016-12-12 DIAGNOSIS — L97512 Non-pressure chronic ulcer of other part of right foot with fat layer exposed: Secondary | ICD-10-CM | POA: Diagnosis not present

## 2016-12-12 DIAGNOSIS — M8668 Other chronic osteomyelitis, other site: Secondary | ICD-10-CM | POA: Diagnosis not present

## 2016-12-12 DIAGNOSIS — E1169 Type 2 diabetes mellitus with other specified complication: Secondary | ICD-10-CM | POA: Diagnosis not present

## 2016-12-12 DIAGNOSIS — T8189XA Other complications of procedures, not elsewhere classified, initial encounter: Secondary | ICD-10-CM | POA: Diagnosis not present

## 2016-12-12 DIAGNOSIS — B9562 Methicillin resistant Staphylococcus aureus infection as the cause of diseases classified elsewhere: Secondary | ICD-10-CM | POA: Diagnosis not present

## 2016-12-12 DIAGNOSIS — E11621 Type 2 diabetes mellitus with foot ulcer: Secondary | ICD-10-CM | POA: Diagnosis not present

## 2016-12-12 DIAGNOSIS — M86671 Other chronic osteomyelitis, right ankle and foot: Secondary | ICD-10-CM | POA: Diagnosis not present

## 2016-12-12 DIAGNOSIS — M86171 Other acute osteomyelitis, right ankle and foot: Secondary | ICD-10-CM | POA: Diagnosis not present

## 2016-12-12 DIAGNOSIS — E1151 Type 2 diabetes mellitus with diabetic peripheral angiopathy without gangrene: Secondary | ICD-10-CM | POA: Diagnosis not present

## 2016-12-12 LAB — GLUCOSE, CAPILLARY
GLUCOSE-CAPILLARY: 141 mg/dL — AB (ref 65–99)
Glucose-Capillary: 109 mg/dL — ABNORMAL HIGH (ref 65–99)

## 2016-12-13 DIAGNOSIS — B9562 Methicillin resistant Staphylococcus aureus infection as the cause of diseases classified elsewhere: Secondary | ICD-10-CM | POA: Diagnosis not present

## 2016-12-13 DIAGNOSIS — T8189XA Other complications of procedures, not elsewhere classified, initial encounter: Secondary | ICD-10-CM | POA: Diagnosis not present

## 2016-12-13 DIAGNOSIS — M86671 Other chronic osteomyelitis, right ankle and foot: Secondary | ICD-10-CM | POA: Diagnosis not present

## 2016-12-13 DIAGNOSIS — L97512 Non-pressure chronic ulcer of other part of right foot with fat layer exposed: Secondary | ICD-10-CM | POA: Diagnosis not present

## 2016-12-13 DIAGNOSIS — E11621 Type 2 diabetes mellitus with foot ulcer: Secondary | ICD-10-CM | POA: Diagnosis not present

## 2016-12-13 DIAGNOSIS — M8668 Other chronic osteomyelitis, other site: Secondary | ICD-10-CM | POA: Diagnosis not present

## 2016-12-13 DIAGNOSIS — E1169 Type 2 diabetes mellitus with other specified complication: Secondary | ICD-10-CM | POA: Diagnosis not present

## 2016-12-13 LAB — GLUCOSE, CAPILLARY
GLUCOSE-CAPILLARY: 109 mg/dL — AB (ref 65–99)
Glucose-Capillary: 144 mg/dL — ABNORMAL HIGH (ref 65–99)

## 2016-12-14 DIAGNOSIS — E1169 Type 2 diabetes mellitus with other specified complication: Secondary | ICD-10-CM | POA: Diagnosis not present

## 2016-12-14 DIAGNOSIS — T8189XA Other complications of procedures, not elsewhere classified, initial encounter: Secondary | ICD-10-CM | POA: Diagnosis not present

## 2016-12-14 DIAGNOSIS — B9562 Methicillin resistant Staphylococcus aureus infection as the cause of diseases classified elsewhere: Secondary | ICD-10-CM | POA: Diagnosis not present

## 2016-12-14 DIAGNOSIS — M86671 Other chronic osteomyelitis, right ankle and foot: Secondary | ICD-10-CM | POA: Diagnosis not present

## 2016-12-14 DIAGNOSIS — E11621 Type 2 diabetes mellitus with foot ulcer: Secondary | ICD-10-CM | POA: Diagnosis not present

## 2016-12-14 DIAGNOSIS — L97512 Non-pressure chronic ulcer of other part of right foot with fat layer exposed: Secondary | ICD-10-CM | POA: Diagnosis not present

## 2016-12-14 DIAGNOSIS — M8668 Other chronic osteomyelitis, other site: Secondary | ICD-10-CM | POA: Diagnosis not present

## 2016-12-14 LAB — GLUCOSE, CAPILLARY
GLUCOSE-CAPILLARY: 147 mg/dL — AB (ref 65–99)
GLUCOSE-CAPILLARY: 106 mg/dL — AB (ref 65–99)

## 2016-12-15 DIAGNOSIS — M8668 Other chronic osteomyelitis, other site: Secondary | ICD-10-CM | POA: Diagnosis not present

## 2016-12-15 DIAGNOSIS — B9562 Methicillin resistant Staphylococcus aureus infection as the cause of diseases classified elsewhere: Secondary | ICD-10-CM | POA: Diagnosis not present

## 2016-12-15 DIAGNOSIS — E1169 Type 2 diabetes mellitus with other specified complication: Secondary | ICD-10-CM | POA: Diagnosis not present

## 2016-12-15 DIAGNOSIS — E11621 Type 2 diabetes mellitus with foot ulcer: Secondary | ICD-10-CM | POA: Diagnosis not present

## 2016-12-15 DIAGNOSIS — T8189XA Other complications of procedures, not elsewhere classified, initial encounter: Secondary | ICD-10-CM | POA: Diagnosis not present

## 2016-12-15 DIAGNOSIS — L97512 Non-pressure chronic ulcer of other part of right foot with fat layer exposed: Secondary | ICD-10-CM | POA: Diagnosis not present

## 2016-12-15 DIAGNOSIS — M86671 Other chronic osteomyelitis, right ankle and foot: Secondary | ICD-10-CM | POA: Diagnosis not present

## 2016-12-15 LAB — GLUCOSE, CAPILLARY
GLUCOSE-CAPILLARY: 191 mg/dL — AB (ref 65–99)
GLUCOSE-CAPILLARY: 125 mg/dL — AB (ref 65–99)

## 2016-12-18 DIAGNOSIS — M8668 Other chronic osteomyelitis, other site: Secondary | ICD-10-CM | POA: Diagnosis not present

## 2016-12-18 DIAGNOSIS — L97512 Non-pressure chronic ulcer of other part of right foot with fat layer exposed: Secondary | ICD-10-CM | POA: Diagnosis not present

## 2016-12-18 DIAGNOSIS — E1169 Type 2 diabetes mellitus with other specified complication: Secondary | ICD-10-CM | POA: Diagnosis not present

## 2016-12-18 DIAGNOSIS — B9562 Methicillin resistant Staphylococcus aureus infection as the cause of diseases classified elsewhere: Secondary | ICD-10-CM | POA: Diagnosis not present

## 2016-12-18 DIAGNOSIS — T8189XA Other complications of procedures, not elsewhere classified, initial encounter: Secondary | ICD-10-CM | POA: Diagnosis not present

## 2016-12-18 DIAGNOSIS — M86671 Other chronic osteomyelitis, right ankle and foot: Secondary | ICD-10-CM | POA: Diagnosis not present

## 2016-12-18 DIAGNOSIS — E11621 Type 2 diabetes mellitus with foot ulcer: Secondary | ICD-10-CM | POA: Diagnosis not present

## 2016-12-18 LAB — GLUCOSE, CAPILLARY
Glucose-Capillary: 161 mg/dL — ABNORMAL HIGH (ref 65–99)
Glucose-Capillary: 137 mg/dL — ABNORMAL HIGH (ref 65–99)

## 2016-12-19 DIAGNOSIS — M86171 Other acute osteomyelitis, right ankle and foot: Secondary | ICD-10-CM | POA: Diagnosis not present

## 2016-12-19 DIAGNOSIS — E11621 Type 2 diabetes mellitus with foot ulcer: Secondary | ICD-10-CM | POA: Diagnosis not present

## 2016-12-19 DIAGNOSIS — E538 Deficiency of other specified B group vitamins: Secondary | ICD-10-CM | POA: Diagnosis not present

## 2016-12-19 DIAGNOSIS — E1151 Type 2 diabetes mellitus with diabetic peripheral angiopathy without gangrene: Secondary | ICD-10-CM | POA: Diagnosis not present

## 2016-12-19 DIAGNOSIS — Z7902 Long term (current) use of antithrombotics/antiplatelets: Secondary | ICD-10-CM | POA: Diagnosis not present

## 2016-12-19 DIAGNOSIS — D529 Folate deficiency anemia, unspecified: Secondary | ICD-10-CM | POA: Diagnosis not present

## 2016-12-19 DIAGNOSIS — B9562 Methicillin resistant Staphylococcus aureus infection as the cause of diseases classified elsewhere: Secondary | ICD-10-CM | POA: Diagnosis not present

## 2016-12-19 DIAGNOSIS — Z5181 Encounter for therapeutic drug level monitoring: Secondary | ICD-10-CM | POA: Diagnosis not present

## 2016-12-19 DIAGNOSIS — Z792 Long term (current) use of antibiotics: Secondary | ICD-10-CM | POA: Diagnosis not present

## 2016-12-19 DIAGNOSIS — L97512 Non-pressure chronic ulcer of other part of right foot with fat layer exposed: Secondary | ICD-10-CM | POA: Diagnosis not present

## 2016-12-19 DIAGNOSIS — E1169 Type 2 diabetes mellitus with other specified complication: Secondary | ICD-10-CM | POA: Diagnosis not present

## 2016-12-19 DIAGNOSIS — M86671 Other chronic osteomyelitis, right ankle and foot: Secondary | ICD-10-CM | POA: Diagnosis not present

## 2016-12-19 DIAGNOSIS — T8189XA Other complications of procedures, not elsewhere classified, initial encounter: Secondary | ICD-10-CM | POA: Diagnosis not present

## 2016-12-19 DIAGNOSIS — M8668 Other chronic osteomyelitis, other site: Secondary | ICD-10-CM | POA: Diagnosis not present

## 2016-12-19 LAB — GLUCOSE, CAPILLARY
GLUCOSE-CAPILLARY: 167 mg/dL — AB (ref 65–99)
Glucose-Capillary: 111 mg/dL — ABNORMAL HIGH (ref 65–99)

## 2016-12-20 DIAGNOSIS — E1169 Type 2 diabetes mellitus with other specified complication: Secondary | ICD-10-CM | POA: Diagnosis not present

## 2016-12-20 DIAGNOSIS — M8668 Other chronic osteomyelitis, other site: Secondary | ICD-10-CM | POA: Diagnosis not present

## 2016-12-20 DIAGNOSIS — L97512 Non-pressure chronic ulcer of other part of right foot with fat layer exposed: Secondary | ICD-10-CM | POA: Diagnosis not present

## 2016-12-20 DIAGNOSIS — T8189XA Other complications of procedures, not elsewhere classified, initial encounter: Secondary | ICD-10-CM | POA: Diagnosis not present

## 2016-12-20 DIAGNOSIS — E11621 Type 2 diabetes mellitus with foot ulcer: Secondary | ICD-10-CM | POA: Diagnosis not present

## 2016-12-20 DIAGNOSIS — M86671 Other chronic osteomyelitis, right ankle and foot: Secondary | ICD-10-CM | POA: Diagnosis not present

## 2016-12-20 DIAGNOSIS — B9562 Methicillin resistant Staphylococcus aureus infection as the cause of diseases classified elsewhere: Secondary | ICD-10-CM | POA: Diagnosis not present

## 2016-12-20 LAB — GLUCOSE, CAPILLARY
Glucose-Capillary: 138 mg/dL — ABNORMAL HIGH (ref 65–99)
Glucose-Capillary: 98 mg/dL (ref 65–99)

## 2016-12-21 ENCOUNTER — Ambulatory Visit (HOSPITAL_COMMUNITY)
Admission: RE | Admit: 2016-12-21 | Discharge: 2016-12-21 | Disposition: A | Discharge status: Home / Self Care | Payer: Medicare Other | Source: Ambulatory Visit | Attending: Internal Medicine | Admitting: Internal Medicine

## 2016-12-21 ENCOUNTER — Other Ambulatory Visit: Payer: Self-pay | Admitting: Internal Medicine

## 2016-12-21 DIAGNOSIS — M868X9 Other osteomyelitis, unspecified sites: Secondary | ICD-10-CM

## 2016-12-21 DIAGNOSIS — D649 Anemia, unspecified: Secondary | ICD-10-CM | POA: Diagnosis not present

## 2016-12-21 DIAGNOSIS — L97919 Non-pressure chronic ulcer of unspecified part of right lower leg with unspecified severity: Secondary | ICD-10-CM | POA: Diagnosis not present

## 2016-12-21 DIAGNOSIS — E1169 Type 2 diabetes mellitus with other specified complication: Secondary | ICD-10-CM | POA: Diagnosis not present

## 2016-12-21 DIAGNOSIS — M86671 Other chronic osteomyelitis, right ankle and foot: Secondary | ICD-10-CM | POA: Diagnosis not present

## 2016-12-21 DIAGNOSIS — R634 Abnormal weight loss: Secondary | ICD-10-CM | POA: Diagnosis not present

## 2016-12-21 DIAGNOSIS — B9562 Methicillin resistant Staphylococcus aureus infection as the cause of diseases classified elsewhere: Secondary | ICD-10-CM | POA: Diagnosis not present

## 2016-12-21 DIAGNOSIS — M86171 Other acute osteomyelitis, right ankle and foot: Secondary | ICD-10-CM | POA: Insufficient documentation

## 2016-12-21 DIAGNOSIS — E11621 Type 2 diabetes mellitus with foot ulcer: Secondary | ICD-10-CM | POA: Diagnosis not present

## 2016-12-21 DIAGNOSIS — M86371 Chronic multifocal osteomyelitis, right ankle and foot: Secondary | ICD-10-CM | POA: Diagnosis not present

## 2016-12-21 DIAGNOSIS — R700 Elevated erythrocyte sedimentation rate: Secondary | ICD-10-CM | POA: Diagnosis not present

## 2016-12-21 DIAGNOSIS — T8189XA Other complications of procedures, not elsewhere classified, initial encounter: Secondary | ICD-10-CM | POA: Diagnosis not present

## 2016-12-21 DIAGNOSIS — Z89421 Acquired absence of other right toe(s): Secondary | ICD-10-CM | POA: Diagnosis not present

## 2016-12-21 DIAGNOSIS — S91301A Unspecified open wound, right foot, initial encounter: Secondary | ICD-10-CM | POA: Diagnosis not present

## 2016-12-21 DIAGNOSIS — R05 Cough: Secondary | ICD-10-CM | POA: Diagnosis not present

## 2016-12-21 DIAGNOSIS — Z6828 Body mass index (BMI) 28.0-28.9, adult: Secondary | ICD-10-CM | POA: Diagnosis not present

## 2016-12-21 DIAGNOSIS — D509 Iron deficiency anemia, unspecified: Secondary | ICD-10-CM | POA: Diagnosis not present

## 2016-12-21 DIAGNOSIS — L97512 Non-pressure chronic ulcer of other part of right foot with fat layer exposed: Secondary | ICD-10-CM | POA: Diagnosis not present

## 2016-12-21 LAB — GLUCOSE, CAPILLARY
GLUCOSE-CAPILLARY: 146 mg/dL — AB (ref 65–99)
GLUCOSE-CAPILLARY: 107 mg/dL — AB (ref 65–99)

## 2016-12-22 DIAGNOSIS — E1169 Type 2 diabetes mellitus with other specified complication: Secondary | ICD-10-CM | POA: Diagnosis not present

## 2016-12-22 DIAGNOSIS — T8189XA Other complications of procedures, not elsewhere classified, initial encounter: Secondary | ICD-10-CM | POA: Diagnosis not present

## 2016-12-22 DIAGNOSIS — M8668 Other chronic osteomyelitis, other site: Secondary | ICD-10-CM | POA: Diagnosis not present

## 2016-12-22 DIAGNOSIS — E11621 Type 2 diabetes mellitus with foot ulcer: Secondary | ICD-10-CM | POA: Diagnosis not present

## 2016-12-22 DIAGNOSIS — L97512 Non-pressure chronic ulcer of other part of right foot with fat layer exposed: Secondary | ICD-10-CM | POA: Diagnosis not present

## 2016-12-22 DIAGNOSIS — B9562 Methicillin resistant Staphylococcus aureus infection as the cause of diseases classified elsewhere: Secondary | ICD-10-CM | POA: Diagnosis not present

## 2016-12-22 DIAGNOSIS — M86671 Other chronic osteomyelitis, right ankle and foot: Secondary | ICD-10-CM | POA: Diagnosis not present

## 2016-12-22 LAB — GLUCOSE, CAPILLARY
GLUCOSE-CAPILLARY: 149 mg/dL — AB (ref 65–99)
GLUCOSE-CAPILLARY: 121 mg/dL — AB (ref 65–99)

## 2016-12-25 DIAGNOSIS — E1169 Type 2 diabetes mellitus with other specified complication: Secondary | ICD-10-CM | POA: Diagnosis not present

## 2016-12-25 DIAGNOSIS — M86671 Other chronic osteomyelitis, right ankle and foot: Secondary | ICD-10-CM | POA: Diagnosis not present

## 2016-12-25 DIAGNOSIS — L97512 Non-pressure chronic ulcer of other part of right foot with fat layer exposed: Secondary | ICD-10-CM | POA: Diagnosis not present

## 2016-12-25 DIAGNOSIS — R599 Enlarged lymph nodes, unspecified: Secondary | ICD-10-CM | POA: Diagnosis not present

## 2016-12-25 DIAGNOSIS — E11621 Type 2 diabetes mellitus with foot ulcer: Secondary | ICD-10-CM | POA: Diagnosis not present

## 2016-12-25 DIAGNOSIS — R918 Other nonspecific abnormal finding of lung field: Secondary | ICD-10-CM | POA: Diagnosis not present

## 2016-12-25 DIAGNOSIS — J189 Pneumonia, unspecified organism: Secondary | ICD-10-CM | POA: Diagnosis not present

## 2016-12-25 DIAGNOSIS — M8668 Other chronic osteomyelitis, other site: Secondary | ICD-10-CM | POA: Diagnosis not present

## 2016-12-25 DIAGNOSIS — B9562 Methicillin resistant Staphylococcus aureus infection as the cause of diseases classified elsewhere: Secondary | ICD-10-CM | POA: Diagnosis not present

## 2016-12-25 DIAGNOSIS — T8189XA Other complications of procedures, not elsewhere classified, initial encounter: Secondary | ICD-10-CM | POA: Diagnosis not present

## 2016-12-25 DIAGNOSIS — I7090 Unspecified atherosclerosis: Secondary | ICD-10-CM | POA: Diagnosis not present

## 2016-12-25 LAB — GLUCOSE, CAPILLARY
GLUCOSE-CAPILLARY: 128 mg/dL — AB (ref 65–99)
GLUCOSE-CAPILLARY: 163 mg/dL — AB (ref 65–99)
GLUCOSE-CAPILLARY: 187 mg/dL — AB (ref 65–99)

## 2016-12-26 DIAGNOSIS — Z951 Presence of aortocoronary bypass graft: Secondary | ICD-10-CM | POA: Diagnosis not present

## 2016-12-26 DIAGNOSIS — T368X5A Adverse effect of other systemic antibiotics, initial encounter: Secondary | ICD-10-CM | POA: Diagnosis not present

## 2016-12-26 DIAGNOSIS — Z89431 Acquired absence of right foot: Secondary | ICD-10-CM | POA: Diagnosis not present

## 2016-12-26 DIAGNOSIS — M86671 Other chronic osteomyelitis, right ankle and foot: Secondary | ICD-10-CM | POA: Diagnosis not present

## 2016-12-26 DIAGNOSIS — E1169 Type 2 diabetes mellitus with other specified complication: Secondary | ICD-10-CM | POA: Diagnosis not present

## 2016-12-26 DIAGNOSIS — Z5181 Encounter for therapeutic drug level monitoring: Secondary | ICD-10-CM | POA: Diagnosis not present

## 2016-12-26 DIAGNOSIS — Z8614 Personal history of Methicillin resistant Staphylococcus aureus infection: Secondary | ICD-10-CM | POA: Diagnosis not present

## 2016-12-26 DIAGNOSIS — B9562 Methicillin resistant Staphylococcus aureus infection as the cause of diseases classified elsewhere: Secondary | ICD-10-CM | POA: Diagnosis not present

## 2016-12-26 DIAGNOSIS — M86171 Other acute osteomyelitis, right ankle and foot: Secondary | ICD-10-CM | POA: Diagnosis not present

## 2016-12-26 DIAGNOSIS — J189 Pneumonia, unspecified organism: Secondary | ICD-10-CM | POA: Diagnosis not present

## 2016-12-26 DIAGNOSIS — I2510 Atherosclerotic heart disease of native coronary artery without angina pectoris: Secondary | ICD-10-CM | POA: Diagnosis not present

## 2016-12-26 DIAGNOSIS — J704 Drug-induced interstitial lung disorders, unspecified: Secondary | ICD-10-CM | POA: Diagnosis not present

## 2016-12-26 DIAGNOSIS — E1151 Type 2 diabetes mellitus with diabetic peripheral angiopathy without gangrene: Secondary | ICD-10-CM | POA: Diagnosis not present

## 2016-12-27 DIAGNOSIS — R918 Other nonspecific abnormal finding of lung field: Secondary | ICD-10-CM | POA: Diagnosis not present

## 2016-12-27 DIAGNOSIS — J189 Pneumonia, unspecified organism: Secondary | ICD-10-CM | POA: Diagnosis not present

## 2016-12-27 DIAGNOSIS — R5383 Other fatigue: Secondary | ICD-10-CM | POA: Diagnosis not present

## 2016-12-27 DIAGNOSIS — J180 Bronchopneumonia, unspecified organism: Secondary | ICD-10-CM | POA: Diagnosis not present

## 2016-12-27 DIAGNOSIS — I2510 Atherosclerotic heart disease of native coronary artery without angina pectoris: Secondary | ICD-10-CM | POA: Diagnosis not present

## 2016-12-27 DIAGNOSIS — J82 Pulmonary eosinophilia, not elsewhere classified: Secondary | ICD-10-CM | POA: Diagnosis not present

## 2016-12-27 DIAGNOSIS — Z7984 Long term (current) use of oral hypoglycemic drugs: Secondary | ICD-10-CM | POA: Diagnosis not present

## 2016-12-27 DIAGNOSIS — E118 Type 2 diabetes mellitus with unspecified complications: Secondary | ICD-10-CM | POA: Diagnosis not present

## 2016-12-27 DIAGNOSIS — J069 Acute upper respiratory infection, unspecified: Secondary | ICD-10-CM | POA: Diagnosis not present

## 2016-12-27 DIAGNOSIS — M868X6 Other osteomyelitis, lower leg: Secondary | ICD-10-CM | POA: Diagnosis not present

## 2016-12-28 ENCOUNTER — Encounter (HOSPITAL_BASED_OUTPATIENT_CLINIC_OR_DEPARTMENT_OTHER): Payer: Medicare Other | Attending: Internal Medicine

## 2016-12-28 DIAGNOSIS — M86671 Other chronic osteomyelitis, right ankle and foot: Secondary | ICD-10-CM | POA: Diagnosis not present

## 2016-12-28 DIAGNOSIS — J82 Pulmonary eosinophilia, not elsewhere classified: Secondary | ICD-10-CM | POA: Diagnosis not present

## 2016-12-28 DIAGNOSIS — Z955 Presence of coronary angioplasty implant and graft: Secondary | ICD-10-CM | POA: Diagnosis not present

## 2016-12-28 DIAGNOSIS — Z7984 Long term (current) use of oral hypoglycemic drugs: Secondary | ICD-10-CM | POA: Diagnosis not present

## 2016-12-28 DIAGNOSIS — I2510 Atherosclerotic heart disease of native coronary artery without angina pectoris: Secondary | ICD-10-CM | POA: Insufficient documentation

## 2016-12-28 DIAGNOSIS — J189 Pneumonia, unspecified organism: Secondary | ICD-10-CM | POA: Diagnosis not present

## 2016-12-28 DIAGNOSIS — J702 Acute drug-induced interstitial lung disorders: Secondary | ICD-10-CM | POA: Insufficient documentation

## 2016-12-28 DIAGNOSIS — I739 Peripheral vascular disease, unspecified: Secondary | ICD-10-CM | POA: Diagnosis not present

## 2016-12-28 DIAGNOSIS — Z89431 Acquired absence of right foot: Secondary | ICD-10-CM | POA: Diagnosis not present

## 2016-12-28 DIAGNOSIS — T8131XD Disruption of external operation (surgical) wound, not elsewhere classified, subsequent encounter: Secondary | ICD-10-CM | POA: Insufficient documentation

## 2016-12-28 DIAGNOSIS — R918 Other nonspecific abnormal finding of lung field: Secondary | ICD-10-CM | POA: Diagnosis not present

## 2016-12-28 DIAGNOSIS — E11621 Type 2 diabetes mellitus with foot ulcer: Secondary | ICD-10-CM | POA: Insufficient documentation

## 2016-12-28 DIAGNOSIS — M1990 Unspecified osteoarthritis, unspecified site: Secondary | ICD-10-CM | POA: Insufficient documentation

## 2016-12-28 DIAGNOSIS — Z794 Long term (current) use of insulin: Secondary | ICD-10-CM | POA: Diagnosis not present

## 2016-12-28 DIAGNOSIS — Y839 Surgical procedure, unspecified as the cause of abnormal reaction of the patient, or of later complication, without mention of misadventure at the time of the procedure: Secondary | ICD-10-CM | POA: Insufficient documentation

## 2016-12-28 DIAGNOSIS — R5383 Other fatigue: Secondary | ICD-10-CM | POA: Diagnosis not present

## 2016-12-28 DIAGNOSIS — M868X6 Other osteomyelitis, lower leg: Secondary | ICD-10-CM | POA: Diagnosis not present

## 2016-12-28 DIAGNOSIS — E1140 Type 2 diabetes mellitus with diabetic neuropathy, unspecified: Secondary | ICD-10-CM | POA: Insufficient documentation

## 2016-12-28 DIAGNOSIS — E119 Type 2 diabetes mellitus without complications: Secondary | ICD-10-CM | POA: Diagnosis not present

## 2016-12-28 DIAGNOSIS — E118 Type 2 diabetes mellitus with unspecified complications: Secondary | ICD-10-CM | POA: Diagnosis not present

## 2016-12-28 DIAGNOSIS — J069 Acute upper respiratory infection, unspecified: Secondary | ICD-10-CM | POA: Diagnosis not present

## 2016-12-28 DIAGNOSIS — L97512 Non-pressure chronic ulcer of other part of right foot with fat layer exposed: Secondary | ICD-10-CM | POA: Insufficient documentation

## 2016-12-29 DIAGNOSIS — J82 Pulmonary eosinophilia, not elsewhere classified: Secondary | ICD-10-CM | POA: Diagnosis not present

## 2017-01-01 DIAGNOSIS — I2510 Atherosclerotic heart disease of native coronary artery without angina pectoris: Secondary | ICD-10-CM | POA: Diagnosis not present

## 2017-01-01 DIAGNOSIS — I739 Peripheral vascular disease, unspecified: Secondary | ICD-10-CM | POA: Diagnosis not present

## 2017-01-01 DIAGNOSIS — Z794 Long term (current) use of insulin: Secondary | ICD-10-CM | POA: Diagnosis not present

## 2017-01-01 DIAGNOSIS — M86671 Other chronic osteomyelitis, right ankle and foot: Secondary | ICD-10-CM | POA: Diagnosis not present

## 2017-01-01 DIAGNOSIS — J702 Acute drug-induced interstitial lung disorders: Secondary | ICD-10-CM | POA: Diagnosis not present

## 2017-01-01 DIAGNOSIS — E1140 Type 2 diabetes mellitus with diabetic neuropathy, unspecified: Secondary | ICD-10-CM | POA: Diagnosis not present

## 2017-01-01 DIAGNOSIS — L97512 Non-pressure chronic ulcer of other part of right foot with fat layer exposed: Secondary | ICD-10-CM | POA: Diagnosis not present

## 2017-01-01 DIAGNOSIS — Y839 Surgical procedure, unspecified as the cause of abnormal reaction of the patient, or of later complication, without mention of misadventure at the time of the procedure: Secondary | ICD-10-CM | POA: Diagnosis not present

## 2017-01-01 DIAGNOSIS — T8131XD Disruption of external operation (surgical) wound, not elsewhere classified, subsequent encounter: Secondary | ICD-10-CM | POA: Diagnosis not present

## 2017-01-01 DIAGNOSIS — E11621 Type 2 diabetes mellitus with foot ulcer: Secondary | ICD-10-CM | POA: Diagnosis not present

## 2017-01-01 DIAGNOSIS — M1990 Unspecified osteoarthritis, unspecified site: Secondary | ICD-10-CM | POA: Diagnosis not present

## 2017-01-03 DIAGNOSIS — R634 Abnormal weight loss: Secondary | ICD-10-CM | POA: Diagnosis not present

## 2017-01-03 DIAGNOSIS — J82 Pulmonary eosinophilia, not elsewhere classified: Secondary | ICD-10-CM | POA: Diagnosis not present

## 2017-01-03 DIAGNOSIS — E1159 Type 2 diabetes mellitus with other circulatory complications: Secondary | ICD-10-CM | POA: Diagnosis not present

## 2017-01-03 DIAGNOSIS — D649 Anemia, unspecified: Secondary | ICD-10-CM | POA: Diagnosis not present

## 2017-01-05 DIAGNOSIS — B9561 Methicillin susceptible Staphylococcus aureus infection as the cause of diseases classified elsewhere: Secondary | ICD-10-CM | POA: Diagnosis not present

## 2017-01-05 DIAGNOSIS — T3695XA Adverse effect of unspecified systemic antibiotic, initial encounter: Secondary | ICD-10-CM | POA: Diagnosis not present

## 2017-01-05 DIAGNOSIS — Z5181 Encounter for therapeutic drug level monitoring: Secondary | ICD-10-CM | POA: Diagnosis not present

## 2017-01-05 DIAGNOSIS — E11621 Type 2 diabetes mellitus with foot ulcer: Secondary | ICD-10-CM | POA: Diagnosis not present

## 2017-01-05 DIAGNOSIS — J704 Drug-induced interstitial lung disorders, unspecified: Secondary | ICD-10-CM | POA: Diagnosis not present

## 2017-01-05 DIAGNOSIS — R700 Elevated erythrocyte sedimentation rate: Secondary | ICD-10-CM | POA: Diagnosis not present

## 2017-01-05 DIAGNOSIS — I739 Peripheral vascular disease, unspecified: Secondary | ICD-10-CM | POA: Diagnosis not present

## 2017-01-05 DIAGNOSIS — J82 Pulmonary eosinophilia, not elsewhere classified: Secondary | ICD-10-CM | POA: Diagnosis not present

## 2017-01-05 DIAGNOSIS — Z792 Long term (current) use of antibiotics: Secondary | ICD-10-CM | POA: Diagnosis not present

## 2017-01-05 DIAGNOSIS — Z794 Long term (current) use of insulin: Secondary | ICD-10-CM | POA: Diagnosis not present

## 2017-01-05 DIAGNOSIS — E1165 Type 2 diabetes mellitus with hyperglycemia: Secondary | ICD-10-CM | POA: Diagnosis not present

## 2017-01-05 DIAGNOSIS — L97419 Non-pressure chronic ulcer of right heel and midfoot with unspecified severity: Secondary | ICD-10-CM | POA: Diagnosis not present

## 2017-01-05 DIAGNOSIS — Z8614 Personal history of Methicillin resistant Staphylococcus aureus infection: Secondary | ICD-10-CM | POA: Diagnosis not present

## 2017-01-05 DIAGNOSIS — M86171 Other acute osteomyelitis, right ankle and foot: Secondary | ICD-10-CM | POA: Diagnosis not present

## 2017-01-11 DIAGNOSIS — J702 Acute drug-induced interstitial lung disorders: Secondary | ICD-10-CM | POA: Diagnosis not present

## 2017-01-11 DIAGNOSIS — E11621 Type 2 diabetes mellitus with foot ulcer: Secondary | ICD-10-CM | POA: Diagnosis not present

## 2017-01-11 DIAGNOSIS — Z5181 Encounter for therapeutic drug level monitoring: Secondary | ICD-10-CM | POA: Diagnosis not present

## 2017-01-11 DIAGNOSIS — T8131XD Disruption of external operation (surgical) wound, not elsewhere classified, subsequent encounter: Secondary | ICD-10-CM | POA: Diagnosis not present

## 2017-01-11 DIAGNOSIS — E1151 Type 2 diabetes mellitus with diabetic peripheral angiopathy without gangrene: Secondary | ICD-10-CM | POA: Diagnosis not present

## 2017-01-11 DIAGNOSIS — Z7902 Long term (current) use of antithrombotics/antiplatelets: Secondary | ICD-10-CM | POA: Diagnosis not present

## 2017-01-11 DIAGNOSIS — E1165 Type 2 diabetes mellitus with hyperglycemia: Secondary | ICD-10-CM | POA: Diagnosis not present

## 2017-01-11 DIAGNOSIS — B9562 Methicillin resistant Staphylococcus aureus infection as the cause of diseases classified elsewhere: Secondary | ICD-10-CM | POA: Diagnosis not present

## 2017-01-11 DIAGNOSIS — M86671 Other chronic osteomyelitis, right ankle and foot: Secondary | ICD-10-CM | POA: Diagnosis not present

## 2017-01-11 DIAGNOSIS — E1169 Type 2 diabetes mellitus with other specified complication: Secondary | ICD-10-CM | POA: Diagnosis not present

## 2017-01-11 DIAGNOSIS — Z452 Encounter for adjustment and management of vascular access device: Secondary | ICD-10-CM | POA: Diagnosis not present

## 2017-01-11 DIAGNOSIS — T8189XA Other complications of procedures, not elsewhere classified, initial encounter: Secondary | ICD-10-CM | POA: Diagnosis not present

## 2017-01-11 DIAGNOSIS — Z792 Long term (current) use of antibiotics: Secondary | ICD-10-CM | POA: Diagnosis not present

## 2017-01-11 DIAGNOSIS — E782 Mixed hyperlipidemia: Secondary | ICD-10-CM | POA: Diagnosis not present

## 2017-01-11 DIAGNOSIS — L97512 Non-pressure chronic ulcer of other part of right foot with fat layer exposed: Secondary | ICD-10-CM | POA: Diagnosis not present

## 2017-01-11 DIAGNOSIS — Z794 Long term (current) use of insulin: Secondary | ICD-10-CM | POA: Diagnosis not present

## 2017-01-15 DIAGNOSIS — E1142 Type 2 diabetes mellitus with diabetic polyneuropathy: Secondary | ICD-10-CM | POA: Diagnosis not present

## 2017-01-15 DIAGNOSIS — I2510 Atherosclerotic heart disease of native coronary artery without angina pectoris: Secondary | ICD-10-CM | POA: Diagnosis not present

## 2017-01-15 DIAGNOSIS — Z794 Long term (current) use of insulin: Secondary | ICD-10-CM | POA: Diagnosis not present

## 2017-01-15 DIAGNOSIS — E1165 Type 2 diabetes mellitus with hyperglycemia: Secondary | ICD-10-CM | POA: Diagnosis not present

## 2017-01-15 DIAGNOSIS — Z89421 Acquired absence of other right toe(s): Secondary | ICD-10-CM | POA: Diagnosis not present

## 2017-01-15 DIAGNOSIS — E1151 Type 2 diabetes mellitus with diabetic peripheral angiopathy without gangrene: Secondary | ICD-10-CM | POA: Diagnosis not present

## 2017-01-15 DIAGNOSIS — I10 Essential (primary) hypertension: Secondary | ICD-10-CM | POA: Diagnosis not present

## 2017-01-15 DIAGNOSIS — E782 Mixed hyperlipidemia: Secondary | ICD-10-CM | POA: Diagnosis not present

## 2017-01-18 DIAGNOSIS — J702 Acute drug-induced interstitial lung disorders: Secondary | ICD-10-CM | POA: Diagnosis not present

## 2017-01-18 DIAGNOSIS — M86671 Other chronic osteomyelitis, right ankle and foot: Secondary | ICD-10-CM | POA: Diagnosis not present

## 2017-01-18 DIAGNOSIS — T8131XD Disruption of external operation (surgical) wound, not elsewhere classified, subsequent encounter: Secondary | ICD-10-CM | POA: Diagnosis not present

## 2017-01-18 DIAGNOSIS — L97512 Non-pressure chronic ulcer of other part of right foot with fat layer exposed: Secondary | ICD-10-CM | POA: Diagnosis not present

## 2017-01-18 DIAGNOSIS — B9562 Methicillin resistant Staphylococcus aureus infection as the cause of diseases classified elsewhere: Secondary | ICD-10-CM | POA: Diagnosis not present

## 2017-01-18 DIAGNOSIS — E11621 Type 2 diabetes mellitus with foot ulcer: Secondary | ICD-10-CM | POA: Diagnosis not present

## 2017-01-18 DIAGNOSIS — Z794 Long term (current) use of insulin: Secondary | ICD-10-CM | POA: Diagnosis not present

## 2017-01-22 DIAGNOSIS — M86671 Other chronic osteomyelitis, right ankle and foot: Secondary | ICD-10-CM | POA: Diagnosis not present

## 2017-01-22 DIAGNOSIS — L97512 Non-pressure chronic ulcer of other part of right foot with fat layer exposed: Secondary | ICD-10-CM | POA: Diagnosis not present

## 2017-01-22 DIAGNOSIS — T8131XD Disruption of external operation (surgical) wound, not elsewhere classified, subsequent encounter: Secondary | ICD-10-CM | POA: Diagnosis not present

## 2017-01-22 DIAGNOSIS — M8668 Other chronic osteomyelitis, other site: Secondary | ICD-10-CM | POA: Diagnosis not present

## 2017-01-22 DIAGNOSIS — E11621 Type 2 diabetes mellitus with foot ulcer: Secondary | ICD-10-CM | POA: Diagnosis not present

## 2017-01-22 DIAGNOSIS — J702 Acute drug-induced interstitial lung disorders: Secondary | ICD-10-CM | POA: Diagnosis not present

## 2017-01-22 DIAGNOSIS — Z794 Long term (current) use of insulin: Secondary | ICD-10-CM | POA: Diagnosis not present

## 2017-01-22 LAB — GLUCOSE, CAPILLARY
GLUCOSE-CAPILLARY: 85 mg/dL (ref 65–99)
Glucose-Capillary: 157 mg/dL — ABNORMAL HIGH (ref 65–99)

## 2017-01-23 DIAGNOSIS — M8668 Other chronic osteomyelitis, other site: Secondary | ICD-10-CM | POA: Diagnosis not present

## 2017-01-23 DIAGNOSIS — T8131XD Disruption of external operation (surgical) wound, not elsewhere classified, subsequent encounter: Secondary | ICD-10-CM | POA: Diagnosis not present

## 2017-01-23 DIAGNOSIS — Z794 Long term (current) use of insulin: Secondary | ICD-10-CM | POA: Diagnosis not present

## 2017-01-23 DIAGNOSIS — L97512 Non-pressure chronic ulcer of other part of right foot with fat layer exposed: Secondary | ICD-10-CM | POA: Diagnosis not present

## 2017-01-23 DIAGNOSIS — M86671 Other chronic osteomyelitis, right ankle and foot: Secondary | ICD-10-CM | POA: Diagnosis not present

## 2017-01-23 DIAGNOSIS — E11621 Type 2 diabetes mellitus with foot ulcer: Secondary | ICD-10-CM | POA: Diagnosis not present

## 2017-01-23 DIAGNOSIS — J702 Acute drug-induced interstitial lung disorders: Secondary | ICD-10-CM | POA: Diagnosis not present

## 2017-01-23 LAB — GLUCOSE, CAPILLARY
GLUCOSE-CAPILLARY: 128 mg/dL — AB (ref 65–99)
GLUCOSE-CAPILLARY: 96 mg/dL (ref 65–99)
Glucose-Capillary: 115 mg/dL — ABNORMAL HIGH (ref 65–99)

## 2017-01-24 DIAGNOSIS — Z794 Long term (current) use of insulin: Secondary | ICD-10-CM | POA: Diagnosis not present

## 2017-01-24 DIAGNOSIS — M8668 Other chronic osteomyelitis, other site: Secondary | ICD-10-CM | POA: Diagnosis not present

## 2017-01-24 DIAGNOSIS — L97512 Non-pressure chronic ulcer of other part of right foot with fat layer exposed: Secondary | ICD-10-CM | POA: Diagnosis not present

## 2017-01-24 DIAGNOSIS — M86671 Other chronic osteomyelitis, right ankle and foot: Secondary | ICD-10-CM | POA: Diagnosis not present

## 2017-01-24 DIAGNOSIS — E11621 Type 2 diabetes mellitus with foot ulcer: Secondary | ICD-10-CM | POA: Diagnosis not present

## 2017-01-24 DIAGNOSIS — T8131XD Disruption of external operation (surgical) wound, not elsewhere classified, subsequent encounter: Secondary | ICD-10-CM | POA: Diagnosis not present

## 2017-01-24 DIAGNOSIS — J702 Acute drug-induced interstitial lung disorders: Secondary | ICD-10-CM | POA: Diagnosis not present

## 2017-01-24 LAB — GLUCOSE, CAPILLARY
Glucose-Capillary: 141 mg/dL — ABNORMAL HIGH (ref 65–99)
Glucose-Capillary: 112 mg/dL — ABNORMAL HIGH (ref 65–99)

## 2017-01-25 DIAGNOSIS — E11621 Type 2 diabetes mellitus with foot ulcer: Secondary | ICD-10-CM | POA: Diagnosis not present

## 2017-01-25 DIAGNOSIS — J702 Acute drug-induced interstitial lung disorders: Secondary | ICD-10-CM | POA: Diagnosis not present

## 2017-01-25 DIAGNOSIS — M8668 Other chronic osteomyelitis, other site: Secondary | ICD-10-CM | POA: Diagnosis not present

## 2017-01-25 DIAGNOSIS — L97512 Non-pressure chronic ulcer of other part of right foot with fat layer exposed: Secondary | ICD-10-CM | POA: Diagnosis not present

## 2017-01-25 DIAGNOSIS — M86671 Other chronic osteomyelitis, right ankle and foot: Secondary | ICD-10-CM | POA: Diagnosis not present

## 2017-01-25 DIAGNOSIS — T8131XD Disruption of external operation (surgical) wound, not elsewhere classified, subsequent encounter: Secondary | ICD-10-CM | POA: Diagnosis not present

## 2017-01-25 DIAGNOSIS — Z794 Long term (current) use of insulin: Secondary | ICD-10-CM | POA: Diagnosis not present

## 2017-01-25 LAB — GLUCOSE, CAPILLARY
Glucose-Capillary: 134 mg/dL — ABNORMAL HIGH (ref 65–99)
Glucose-Capillary: 150 mg/dL — ABNORMAL HIGH (ref 65–99)

## 2017-01-26 DIAGNOSIS — T8131XD Disruption of external operation (surgical) wound, not elsewhere classified, subsequent encounter: Secondary | ICD-10-CM | POA: Diagnosis not present

## 2017-01-26 DIAGNOSIS — J702 Acute drug-induced interstitial lung disorders: Secondary | ICD-10-CM | POA: Diagnosis not present

## 2017-01-26 DIAGNOSIS — M8668 Other chronic osteomyelitis, other site: Secondary | ICD-10-CM | POA: Diagnosis not present

## 2017-01-26 DIAGNOSIS — Z794 Long term (current) use of insulin: Secondary | ICD-10-CM | POA: Diagnosis not present

## 2017-01-26 DIAGNOSIS — E11621 Type 2 diabetes mellitus with foot ulcer: Secondary | ICD-10-CM | POA: Diagnosis not present

## 2017-01-26 DIAGNOSIS — L97512 Non-pressure chronic ulcer of other part of right foot with fat layer exposed: Secondary | ICD-10-CM | POA: Diagnosis not present

## 2017-01-26 DIAGNOSIS — M86671 Other chronic osteomyelitis, right ankle and foot: Secondary | ICD-10-CM | POA: Diagnosis not present

## 2017-01-26 LAB — GLUCOSE, CAPILLARY
GLUCOSE-CAPILLARY: 176 mg/dL — AB (ref 65–99)
GLUCOSE-CAPILLARY: 132 mg/dL — AB (ref 65–99)

## 2017-01-29 ENCOUNTER — Encounter (HOSPITAL_BASED_OUTPATIENT_CLINIC_OR_DEPARTMENT_OTHER): Payer: Medicare Other | Attending: Internal Medicine

## 2017-01-29 DIAGNOSIS — M8668 Other chronic osteomyelitis, other site: Secondary | ICD-10-CM | POA: Diagnosis not present

## 2017-01-29 DIAGNOSIS — M86671 Other chronic osteomyelitis, right ankle and foot: Secondary | ICD-10-CM | POA: Diagnosis not present

## 2017-01-29 DIAGNOSIS — Z89431 Acquired absence of right foot: Secondary | ICD-10-CM | POA: Diagnosis not present

## 2017-01-29 DIAGNOSIS — E1169 Type 2 diabetes mellitus with other specified complication: Secondary | ICD-10-CM | POA: Insufficient documentation

## 2017-01-29 LAB — GLUCOSE, CAPILLARY
GLUCOSE-CAPILLARY: 118 mg/dL — AB (ref 65–99)
Glucose-Capillary: 165 mg/dL — ABNORMAL HIGH (ref 65–99)

## 2017-01-30 DIAGNOSIS — M86671 Other chronic osteomyelitis, right ankle and foot: Secondary | ICD-10-CM | POA: Diagnosis not present

## 2017-01-30 DIAGNOSIS — E1169 Type 2 diabetes mellitus with other specified complication: Secondary | ICD-10-CM | POA: Diagnosis not present

## 2017-01-30 DIAGNOSIS — M8668 Other chronic osteomyelitis, other site: Secondary | ICD-10-CM | POA: Diagnosis not present

## 2017-01-30 DIAGNOSIS — Z89431 Acquired absence of right foot: Secondary | ICD-10-CM | POA: Diagnosis not present

## 2017-01-30 LAB — GLUCOSE, CAPILLARY
Glucose-Capillary: 154 mg/dL — ABNORMAL HIGH (ref 65–99)
Glucose-Capillary: 129 mg/dL — ABNORMAL HIGH (ref 65–99)

## 2017-01-31 DIAGNOSIS — M86671 Other chronic osteomyelitis, right ankle and foot: Secondary | ICD-10-CM | POA: Diagnosis not present

## 2017-01-31 DIAGNOSIS — J82 Pulmonary eosinophilia, not elsewhere classified: Secondary | ICD-10-CM | POA: Diagnosis not present

## 2017-01-31 DIAGNOSIS — M8668 Other chronic osteomyelitis, other site: Secondary | ICD-10-CM | POA: Diagnosis not present

## 2017-01-31 DIAGNOSIS — Z89431 Acquired absence of right foot: Secondary | ICD-10-CM | POA: Diagnosis not present

## 2017-01-31 DIAGNOSIS — E1169 Type 2 diabetes mellitus with other specified complication: Secondary | ICD-10-CM | POA: Diagnosis not present

## 2017-01-31 DIAGNOSIS — D649 Anemia, unspecified: Secondary | ICD-10-CM | POA: Diagnosis not present

## 2017-01-31 DIAGNOSIS — R634 Abnormal weight loss: Secondary | ICD-10-CM | POA: Diagnosis not present

## 2017-01-31 LAB — GLUCOSE, CAPILLARY
Glucose-Capillary: 144 mg/dL — ABNORMAL HIGH (ref 65–99)
Glucose-Capillary: 112 mg/dL — ABNORMAL HIGH (ref 65–99)

## 2017-02-01 DIAGNOSIS — Z89431 Acquired absence of right foot: Secondary | ICD-10-CM | POA: Diagnosis not present

## 2017-02-01 DIAGNOSIS — M86671 Other chronic osteomyelitis, right ankle and foot: Secondary | ICD-10-CM | POA: Diagnosis not present

## 2017-02-01 DIAGNOSIS — M8668 Other chronic osteomyelitis, other site: Secondary | ICD-10-CM | POA: Diagnosis not present

## 2017-02-01 DIAGNOSIS — E1169 Type 2 diabetes mellitus with other specified complication: Secondary | ICD-10-CM | POA: Diagnosis not present

## 2017-02-01 LAB — GLUCOSE, CAPILLARY
Glucose-Capillary: 139 mg/dL — ABNORMAL HIGH (ref 65–99)
Glucose-Capillary: 108 mg/dL — ABNORMAL HIGH (ref 65–99)

## 2017-02-02 DIAGNOSIS — Z89431 Acquired absence of right foot: Secondary | ICD-10-CM | POA: Diagnosis not present

## 2017-02-02 DIAGNOSIS — M86671 Other chronic osteomyelitis, right ankle and foot: Secondary | ICD-10-CM | POA: Diagnosis not present

## 2017-02-02 DIAGNOSIS — E1169 Type 2 diabetes mellitus with other specified complication: Secondary | ICD-10-CM | POA: Diagnosis not present

## 2017-02-02 DIAGNOSIS — M8668 Other chronic osteomyelitis, other site: Secondary | ICD-10-CM | POA: Diagnosis not present

## 2017-02-02 LAB — GLUCOSE, CAPILLARY
Glucose-Capillary: 137 mg/dL — ABNORMAL HIGH (ref 65–99)
Glucose-Capillary: 113 mg/dL — ABNORMAL HIGH (ref 65–99)

## 2017-02-05 DIAGNOSIS — Z89431 Acquired absence of right foot: Secondary | ICD-10-CM | POA: Diagnosis not present

## 2017-02-05 DIAGNOSIS — E1169 Type 2 diabetes mellitus with other specified complication: Secondary | ICD-10-CM | POA: Diagnosis not present

## 2017-02-05 DIAGNOSIS — M86671 Other chronic osteomyelitis, right ankle and foot: Secondary | ICD-10-CM | POA: Diagnosis not present

## 2017-02-05 DIAGNOSIS — M8668 Other chronic osteomyelitis, other site: Secondary | ICD-10-CM | POA: Diagnosis not present

## 2017-02-05 LAB — GLUCOSE, CAPILLARY
Glucose-Capillary: 178 mg/dL — ABNORMAL HIGH (ref 65–99)
Glucose-Capillary: 127 mg/dL — ABNORMAL HIGH (ref 65–99)

## 2017-02-06 DIAGNOSIS — E1169 Type 2 diabetes mellitus with other specified complication: Secondary | ICD-10-CM | POA: Diagnosis not present

## 2017-02-06 DIAGNOSIS — Z89431 Acquired absence of right foot: Secondary | ICD-10-CM | POA: Diagnosis not present

## 2017-02-06 DIAGNOSIS — M86671 Other chronic osteomyelitis, right ankle and foot: Secondary | ICD-10-CM | POA: Diagnosis not present

## 2017-02-06 DIAGNOSIS — M8668 Other chronic osteomyelitis, other site: Secondary | ICD-10-CM | POA: Diagnosis not present

## 2017-02-06 LAB — GLUCOSE, CAPILLARY
GLUCOSE-CAPILLARY: 121 mg/dL — AB (ref 65–99)
Glucose-Capillary: 163 mg/dL — ABNORMAL HIGH (ref 65–99)

## 2017-02-07 DIAGNOSIS — M86671 Other chronic osteomyelitis, right ankle and foot: Secondary | ICD-10-CM | POA: Diagnosis not present

## 2017-02-07 DIAGNOSIS — Z89431 Acquired absence of right foot: Secondary | ICD-10-CM | POA: Diagnosis not present

## 2017-02-07 DIAGNOSIS — M8668 Other chronic osteomyelitis, other site: Secondary | ICD-10-CM | POA: Diagnosis not present

## 2017-02-07 DIAGNOSIS — E1169 Type 2 diabetes mellitus with other specified complication: Secondary | ICD-10-CM | POA: Diagnosis not present

## 2017-02-07 LAB — GLUCOSE, CAPILLARY
Glucose-Capillary: 135 mg/dL — ABNORMAL HIGH (ref 65–99)
Glucose-Capillary: 106 mg/dL — ABNORMAL HIGH (ref 65–99)

## 2017-02-08 DIAGNOSIS — E1169 Type 2 diabetes mellitus with other specified complication: Secondary | ICD-10-CM | POA: Diagnosis not present

## 2017-02-08 DIAGNOSIS — Z89431 Acquired absence of right foot: Secondary | ICD-10-CM | POA: Diagnosis not present

## 2017-02-08 DIAGNOSIS — M8668 Other chronic osteomyelitis, other site: Secondary | ICD-10-CM | POA: Diagnosis not present

## 2017-02-08 DIAGNOSIS — M86671 Other chronic osteomyelitis, right ankle and foot: Secondary | ICD-10-CM | POA: Diagnosis not present

## 2017-02-08 LAB — GLUCOSE, CAPILLARY
Glucose-Capillary: 183 mg/dL — ABNORMAL HIGH (ref 65–99)
Glucose-Capillary: 134 mg/dL — ABNORMAL HIGH (ref 65–99)

## 2017-02-09 DIAGNOSIS — E1169 Type 2 diabetes mellitus with other specified complication: Secondary | ICD-10-CM | POA: Diagnosis not present

## 2017-02-09 DIAGNOSIS — M8668 Other chronic osteomyelitis, other site: Secondary | ICD-10-CM | POA: Diagnosis not present

## 2017-02-09 DIAGNOSIS — M86671 Other chronic osteomyelitis, right ankle and foot: Secondary | ICD-10-CM | POA: Diagnosis not present

## 2017-02-09 DIAGNOSIS — Z89431 Acquired absence of right foot: Secondary | ICD-10-CM | POA: Diagnosis not present

## 2017-02-09 LAB — GLUCOSE, CAPILLARY
Glucose-Capillary: 167 mg/dL — ABNORMAL HIGH (ref 65–99)
Glucose-Capillary: 116 mg/dL — ABNORMAL HIGH (ref 65–99)

## 2017-02-12 DIAGNOSIS — Z89431 Acquired absence of right foot: Secondary | ICD-10-CM | POA: Diagnosis not present

## 2017-02-12 DIAGNOSIS — E1169 Type 2 diabetes mellitus with other specified complication: Secondary | ICD-10-CM | POA: Diagnosis not present

## 2017-02-12 DIAGNOSIS — M86671 Other chronic osteomyelitis, right ankle and foot: Secondary | ICD-10-CM | POA: Diagnosis not present

## 2017-02-12 LAB — GLUCOSE, CAPILLARY
GLUCOSE-CAPILLARY: 131 mg/dL — AB (ref 65–99)
Glucose-Capillary: 188 mg/dL — ABNORMAL HIGH (ref 65–99)

## 2017-02-13 DIAGNOSIS — E1142 Type 2 diabetes mellitus with diabetic polyneuropathy: Secondary | ICD-10-CM | POA: Diagnosis not present

## 2017-02-13 DIAGNOSIS — E1169 Type 2 diabetes mellitus with other specified complication: Secondary | ICD-10-CM | POA: Diagnosis not present

## 2017-02-13 DIAGNOSIS — M86671 Other chronic osteomyelitis, right ankle and foot: Secondary | ICD-10-CM | POA: Diagnosis not present

## 2017-02-13 DIAGNOSIS — Z89431 Acquired absence of right foot: Secondary | ICD-10-CM | POA: Diagnosis not present

## 2017-02-13 DIAGNOSIS — M8668 Other chronic osteomyelitis, other site: Secondary | ICD-10-CM | POA: Diagnosis not present

## 2017-02-13 LAB — GLUCOSE, CAPILLARY
Glucose-Capillary: 153 mg/dL — ABNORMAL HIGH (ref 65–99)
Glucose-Capillary: 95 mg/dL (ref 65–99)

## 2017-02-14 DIAGNOSIS — M8668 Other chronic osteomyelitis, other site: Secondary | ICD-10-CM | POA: Diagnosis not present

## 2017-02-14 DIAGNOSIS — E1169 Type 2 diabetes mellitus with other specified complication: Secondary | ICD-10-CM | POA: Diagnosis not present

## 2017-02-14 DIAGNOSIS — M86671 Other chronic osteomyelitis, right ankle and foot: Secondary | ICD-10-CM | POA: Diagnosis not present

## 2017-02-14 DIAGNOSIS — Z89431 Acquired absence of right foot: Secondary | ICD-10-CM | POA: Diagnosis not present

## 2017-02-14 LAB — GLUCOSE, CAPILLARY
Glucose-Capillary: 166 mg/dL — ABNORMAL HIGH (ref 65–99)
Glucose-Capillary: 126 mg/dL — ABNORMAL HIGH (ref 65–99)

## 2017-02-15 DIAGNOSIS — M8668 Other chronic osteomyelitis, other site: Secondary | ICD-10-CM | POA: Diagnosis not present

## 2017-02-15 DIAGNOSIS — Z89431 Acquired absence of right foot: Secondary | ICD-10-CM | POA: Diagnosis not present

## 2017-02-15 DIAGNOSIS — M86671 Other chronic osteomyelitis, right ankle and foot: Secondary | ICD-10-CM | POA: Diagnosis not present

## 2017-02-15 DIAGNOSIS — E11621 Type 2 diabetes mellitus with foot ulcer: Secondary | ICD-10-CM | POA: Diagnosis not present

## 2017-02-15 DIAGNOSIS — L97519 Non-pressure chronic ulcer of other part of right foot with unspecified severity: Secondary | ICD-10-CM | POA: Diagnosis not present

## 2017-02-15 DIAGNOSIS — L84 Corns and callosities: Secondary | ICD-10-CM | POA: Diagnosis not present

## 2017-02-15 DIAGNOSIS — E1169 Type 2 diabetes mellitus with other specified complication: Secondary | ICD-10-CM | POA: Diagnosis not present

## 2017-02-15 LAB — GLUCOSE, CAPILLARY
GLUCOSE-CAPILLARY: 172 mg/dL — AB (ref 65–99)
Glucose-Capillary: 161 mg/dL — ABNORMAL HIGH (ref 65–99)

## 2017-02-16 DIAGNOSIS — E785 Hyperlipidemia, unspecified: Secondary | ICD-10-CM | POA: Diagnosis not present

## 2017-02-16 DIAGNOSIS — I998 Other disorder of circulatory system: Secondary | ICD-10-CM | POA: Diagnosis not present

## 2017-02-16 DIAGNOSIS — I739 Peripheral vascular disease, unspecified: Secondary | ICD-10-CM | POA: Diagnosis not present

## 2017-02-16 DIAGNOSIS — I10 Essential (primary) hypertension: Secondary | ICD-10-CM | POA: Diagnosis not present

## 2017-04-12 DIAGNOSIS — Z794 Long term (current) use of insulin: Secondary | ICD-10-CM | POA: Diagnosis not present

## 2017-04-12 DIAGNOSIS — E1165 Type 2 diabetes mellitus with hyperglycemia: Secondary | ICD-10-CM | POA: Diagnosis not present

## 2017-04-12 DIAGNOSIS — E782 Mixed hyperlipidemia: Secondary | ICD-10-CM | POA: Diagnosis not present

## 2017-04-16 DIAGNOSIS — I2510 Atherosclerotic heart disease of native coronary artery without angina pectoris: Secondary | ICD-10-CM | POA: Diagnosis not present

## 2017-04-16 DIAGNOSIS — I10 Essential (primary) hypertension: Secondary | ICD-10-CM | POA: Diagnosis not present

## 2017-04-16 DIAGNOSIS — S27399D Other injuries of lung, unspecified, subsequent encounter: Secondary | ICD-10-CM | POA: Diagnosis not present

## 2017-04-16 DIAGNOSIS — E785 Hyperlipidemia, unspecified: Secondary | ICD-10-CM | POA: Diagnosis not present

## 2017-04-16 DIAGNOSIS — Z951 Presence of aortocoronary bypass graft: Secondary | ICD-10-CM | POA: Diagnosis not present

## 2017-04-16 DIAGNOSIS — T50905A Adverse effect of unspecified drugs, medicaments and biological substances, initial encounter: Secondary | ICD-10-CM | POA: Diagnosis not present

## 2017-04-16 DIAGNOSIS — Z79899 Other long term (current) drug therapy: Secondary | ICD-10-CM | POA: Diagnosis not present

## 2017-04-16 DIAGNOSIS — E669 Obesity, unspecified: Secondary | ICD-10-CM | POA: Diagnosis not present

## 2017-04-16 DIAGNOSIS — Z881 Allergy status to other antibiotic agents status: Secondary | ICD-10-CM | POA: Diagnosis not present

## 2017-04-16 DIAGNOSIS — E1142 Type 2 diabetes mellitus with diabetic polyneuropathy: Secondary | ICD-10-CM | POA: Diagnosis not present

## 2017-04-16 DIAGNOSIS — Z7902 Long term (current) use of antithrombotics/antiplatelets: Secondary | ICD-10-CM | POA: Diagnosis not present

## 2017-04-16 DIAGNOSIS — Z6829 Body mass index (BMI) 29.0-29.9, adult: Secondary | ICD-10-CM | POA: Diagnosis not present

## 2017-04-16 DIAGNOSIS — Z7982 Long term (current) use of aspirin: Secondary | ICD-10-CM | POA: Diagnosis not present

## 2017-04-16 DIAGNOSIS — E1151 Type 2 diabetes mellitus with diabetic peripheral angiopathy without gangrene: Secondary | ICD-10-CM | POA: Diagnosis not present

## 2017-04-16 DIAGNOSIS — J984 Other disorders of lung: Secondary | ICD-10-CM | POA: Diagnosis not present

## 2017-04-16 DIAGNOSIS — Z794 Long term (current) use of insulin: Secondary | ICD-10-CM | POA: Diagnosis not present

## 2017-04-16 DIAGNOSIS — R0689 Other abnormalities of breathing: Secondary | ICD-10-CM | POA: Diagnosis not present

## 2017-04-16 DIAGNOSIS — J82 Pulmonary eosinophilia, not elsewhere classified: Secondary | ICD-10-CM | POA: Diagnosis not present

## 2017-04-17 DIAGNOSIS — I10 Essential (primary) hypertension: Secondary | ICD-10-CM | POA: Diagnosis not present

## 2017-04-17 DIAGNOSIS — E1165 Type 2 diabetes mellitus with hyperglycemia: Secondary | ICD-10-CM | POA: Diagnosis not present

## 2017-04-17 DIAGNOSIS — I70203 Unspecified atherosclerosis of native arteries of extremities, bilateral legs: Secondary | ICD-10-CM | POA: Diagnosis not present

## 2017-04-17 DIAGNOSIS — E1142 Type 2 diabetes mellitus with diabetic polyneuropathy: Secondary | ICD-10-CM | POA: Diagnosis not present

## 2017-04-17 DIAGNOSIS — Z794 Long term (current) use of insulin: Secondary | ICD-10-CM | POA: Diagnosis not present

## 2017-04-17 DIAGNOSIS — Z79899 Other long term (current) drug therapy: Secondary | ICD-10-CM | POA: Diagnosis not present

## 2017-04-17 DIAGNOSIS — E782 Mixed hyperlipidemia: Secondary | ICD-10-CM | POA: Diagnosis not present

## 2017-04-17 DIAGNOSIS — I2510 Atherosclerotic heart disease of native coronary artery without angina pectoris: Secondary | ICD-10-CM | POA: Diagnosis not present

## 2017-05-15 DIAGNOSIS — L97522 Non-pressure chronic ulcer of other part of left foot with fat layer exposed: Secondary | ICD-10-CM | POA: Diagnosis not present

## 2017-05-22 DIAGNOSIS — L97521 Non-pressure chronic ulcer of other part of left foot limited to breakdown of skin: Secondary | ICD-10-CM | POA: Diagnosis not present

## 2017-06-05 DIAGNOSIS — L97522 Non-pressure chronic ulcer of other part of left foot with fat layer exposed: Secondary | ICD-10-CM | POA: Diagnosis not present

## 2017-06-26 DIAGNOSIS — L97521 Non-pressure chronic ulcer of other part of left foot limited to breakdown of skin: Secondary | ICD-10-CM | POA: Diagnosis not present

## 2017-06-26 DIAGNOSIS — L97511 Non-pressure chronic ulcer of other part of right foot limited to breakdown of skin: Secondary | ICD-10-CM | POA: Diagnosis not present

## 2017-07-09 ENCOUNTER — Encounter (HOSPITAL_BASED_OUTPATIENT_CLINIC_OR_DEPARTMENT_OTHER): Payer: Medicare Other | Attending: Internal Medicine

## 2017-07-09 DIAGNOSIS — L84 Corns and callosities: Secondary | ICD-10-CM | POA: Insufficient documentation

## 2017-07-09 DIAGNOSIS — E1151 Type 2 diabetes mellitus with diabetic peripheral angiopathy without gangrene: Secondary | ICD-10-CM | POA: Insufficient documentation

## 2017-07-09 DIAGNOSIS — Z8631 Personal history of diabetic foot ulcer: Secondary | ICD-10-CM | POA: Diagnosis not present

## 2017-07-09 DIAGNOSIS — L97511 Non-pressure chronic ulcer of other part of right foot limited to breakdown of skin: Secondary | ICD-10-CM | POA: Diagnosis not present

## 2017-07-09 DIAGNOSIS — I10 Essential (primary) hypertension: Secondary | ICD-10-CM | POA: Insufficient documentation

## 2017-07-09 DIAGNOSIS — Z09 Encounter for follow-up examination after completed treatment for conditions other than malignant neoplasm: Secondary | ICD-10-CM | POA: Diagnosis not present

## 2017-07-09 DIAGNOSIS — E11621 Type 2 diabetes mellitus with foot ulcer: Secondary | ICD-10-CM | POA: Diagnosis not present

## 2017-07-09 DIAGNOSIS — I2510 Atherosclerotic heart disease of native coronary artery without angina pectoris: Secondary | ICD-10-CM | POA: Diagnosis not present

## 2017-07-09 DIAGNOSIS — E1140 Type 2 diabetes mellitus with diabetic neuropathy, unspecified: Secondary | ICD-10-CM | POA: Diagnosis not present

## 2017-07-09 DIAGNOSIS — Z89431 Acquired absence of right foot: Secondary | ICD-10-CM | POA: Diagnosis not present

## 2017-07-09 DIAGNOSIS — I739 Peripheral vascular disease, unspecified: Secondary | ICD-10-CM | POA: Diagnosis not present

## 2017-07-17 DIAGNOSIS — L97511 Non-pressure chronic ulcer of other part of right foot limited to breakdown of skin: Secondary | ICD-10-CM | POA: Diagnosis not present

## 2017-07-17 DIAGNOSIS — L97521 Non-pressure chronic ulcer of other part of left foot limited to breakdown of skin: Secondary | ICD-10-CM | POA: Diagnosis not present

## 2017-07-19 DIAGNOSIS — E1165 Type 2 diabetes mellitus with hyperglycemia: Secondary | ICD-10-CM | POA: Diagnosis not present

## 2017-07-19 DIAGNOSIS — Z794 Long term (current) use of insulin: Secondary | ICD-10-CM | POA: Diagnosis not present

## 2017-07-19 DIAGNOSIS — E782 Mixed hyperlipidemia: Secondary | ICD-10-CM | POA: Diagnosis not present

## 2017-07-24 DIAGNOSIS — E1165 Type 2 diabetes mellitus with hyperglycemia: Secondary | ICD-10-CM | POA: Diagnosis not present

## 2017-07-24 DIAGNOSIS — I2510 Atherosclerotic heart disease of native coronary artery without angina pectoris: Secondary | ICD-10-CM | POA: Diagnosis not present

## 2017-07-24 DIAGNOSIS — Z23 Encounter for immunization: Secondary | ICD-10-CM | POA: Diagnosis not present

## 2017-07-24 DIAGNOSIS — E782 Mixed hyperlipidemia: Secondary | ICD-10-CM | POA: Diagnosis not present

## 2017-07-24 DIAGNOSIS — J82 Pulmonary eosinophilia, not elsewhere classified: Secondary | ICD-10-CM | POA: Diagnosis not present

## 2017-07-24 DIAGNOSIS — E1142 Type 2 diabetes mellitus with diabetic polyneuropathy: Secondary | ICD-10-CM | POA: Diagnosis not present

## 2017-07-24 DIAGNOSIS — Z79899 Other long term (current) drug therapy: Secondary | ICD-10-CM | POA: Diagnosis not present

## 2017-07-24 DIAGNOSIS — Z794 Long term (current) use of insulin: Secondary | ICD-10-CM | POA: Diagnosis not present

## 2017-07-24 DIAGNOSIS — I70203 Unspecified atherosclerosis of native arteries of extremities, bilateral legs: Secondary | ICD-10-CM | POA: Diagnosis not present

## 2017-07-24 DIAGNOSIS — I10 Essential (primary) hypertension: Secondary | ICD-10-CM | POA: Diagnosis not present

## 2017-08-07 DIAGNOSIS — E119 Type 2 diabetes mellitus without complications: Secondary | ICD-10-CM | POA: Diagnosis not present

## 2017-08-07 DIAGNOSIS — E113293 Type 2 diabetes mellitus with mild nonproliferative diabetic retinopathy without macular edema, bilateral: Secondary | ICD-10-CM | POA: Diagnosis not present

## 2017-08-14 DIAGNOSIS — L97521 Non-pressure chronic ulcer of other part of left foot limited to breakdown of skin: Secondary | ICD-10-CM | POA: Diagnosis not present

## 2017-08-14 DIAGNOSIS — L97511 Non-pressure chronic ulcer of other part of right foot limited to breakdown of skin: Secondary | ICD-10-CM | POA: Diagnosis not present

## 2017-08-24 DIAGNOSIS — I739 Peripheral vascular disease, unspecified: Secondary | ICD-10-CM | POA: Diagnosis not present

## 2017-08-24 DIAGNOSIS — I998 Other disorder of circulatory system: Secondary | ICD-10-CM | POA: Diagnosis not present

## 2017-08-24 DIAGNOSIS — E785 Hyperlipidemia, unspecified: Secondary | ICD-10-CM | POA: Diagnosis not present

## 2017-08-24 DIAGNOSIS — I2510 Atherosclerotic heart disease of native coronary artery without angina pectoris: Secondary | ICD-10-CM | POA: Diagnosis not present

## 2017-08-24 DIAGNOSIS — I771 Stricture of artery: Secondary | ICD-10-CM | POA: Diagnosis not present

## 2017-08-24 DIAGNOSIS — I10 Essential (primary) hypertension: Secondary | ICD-10-CM | POA: Diagnosis not present

## 2017-08-24 DIAGNOSIS — E118 Type 2 diabetes mellitus with unspecified complications: Secondary | ICD-10-CM | POA: Diagnosis not present

## 2017-08-24 DIAGNOSIS — N200 Calculus of kidney: Secondary | ICD-10-CM | POA: Diagnosis not present

## 2017-08-28 DIAGNOSIS — L97521 Non-pressure chronic ulcer of other part of left foot limited to breakdown of skin: Secondary | ICD-10-CM | POA: Diagnosis not present

## 2017-08-28 DIAGNOSIS — I890 Lymphedema, not elsewhere classified: Secondary | ICD-10-CM | POA: Diagnosis not present

## 2017-09-25 DIAGNOSIS — I890 Lymphedema, not elsewhere classified: Secondary | ICD-10-CM | POA: Diagnosis not present

## 2017-09-25 DIAGNOSIS — L97511 Non-pressure chronic ulcer of other part of right foot limited to breakdown of skin: Secondary | ICD-10-CM | POA: Diagnosis not present

## 2017-09-25 DIAGNOSIS — L97521 Non-pressure chronic ulcer of other part of left foot limited to breakdown of skin: Secondary | ICD-10-CM | POA: Diagnosis not present

## 2017-10-15 DIAGNOSIS — E782 Mixed hyperlipidemia: Secondary | ICD-10-CM | POA: Diagnosis not present

## 2017-10-15 DIAGNOSIS — Z794 Long term (current) use of insulin: Secondary | ICD-10-CM | POA: Diagnosis not present

## 2017-10-15 DIAGNOSIS — E1165 Type 2 diabetes mellitus with hyperglycemia: Secondary | ICD-10-CM | POA: Diagnosis not present

## 2017-10-19 DIAGNOSIS — E782 Mixed hyperlipidemia: Secondary | ICD-10-CM | POA: Diagnosis not present

## 2017-10-19 DIAGNOSIS — I10 Essential (primary) hypertension: Secondary | ICD-10-CM | POA: Diagnosis not present

## 2017-10-19 DIAGNOSIS — E1142 Type 2 diabetes mellitus with diabetic polyneuropathy: Secondary | ICD-10-CM | POA: Diagnosis not present

## 2017-10-19 DIAGNOSIS — Z794 Long term (current) use of insulin: Secondary | ICD-10-CM | POA: Diagnosis not present

## 2017-10-19 DIAGNOSIS — E1165 Type 2 diabetes mellitus with hyperglycemia: Secondary | ICD-10-CM | POA: Diagnosis not present

## 2017-10-19 DIAGNOSIS — Z79899 Other long term (current) drug therapy: Secondary | ICD-10-CM | POA: Diagnosis not present

## 2017-10-19 DIAGNOSIS — I70203 Unspecified atherosclerosis of native arteries of extremities, bilateral legs: Secondary | ICD-10-CM | POA: Diagnosis not present

## 2017-10-19 DIAGNOSIS — I2510 Atherosclerotic heart disease of native coronary artery without angina pectoris: Secondary | ICD-10-CM | POA: Diagnosis not present

## 2017-10-19 DIAGNOSIS — J82 Pulmonary eosinophilia, not elsewhere classified: Secondary | ICD-10-CM | POA: Diagnosis not present

## 2018-02-05 IMAGING — CR DG FOOT COMPLETE 3+V*R*
3 series · 3 of 3 positions shown · non-contrast
Comparison: None.

CLINICAL DATA: Acute onset of right foot infection. Initial
encounter.2120

EXAM:
RIGHT FOOT COMPLETE - 3+ VIEW

[foot ap]
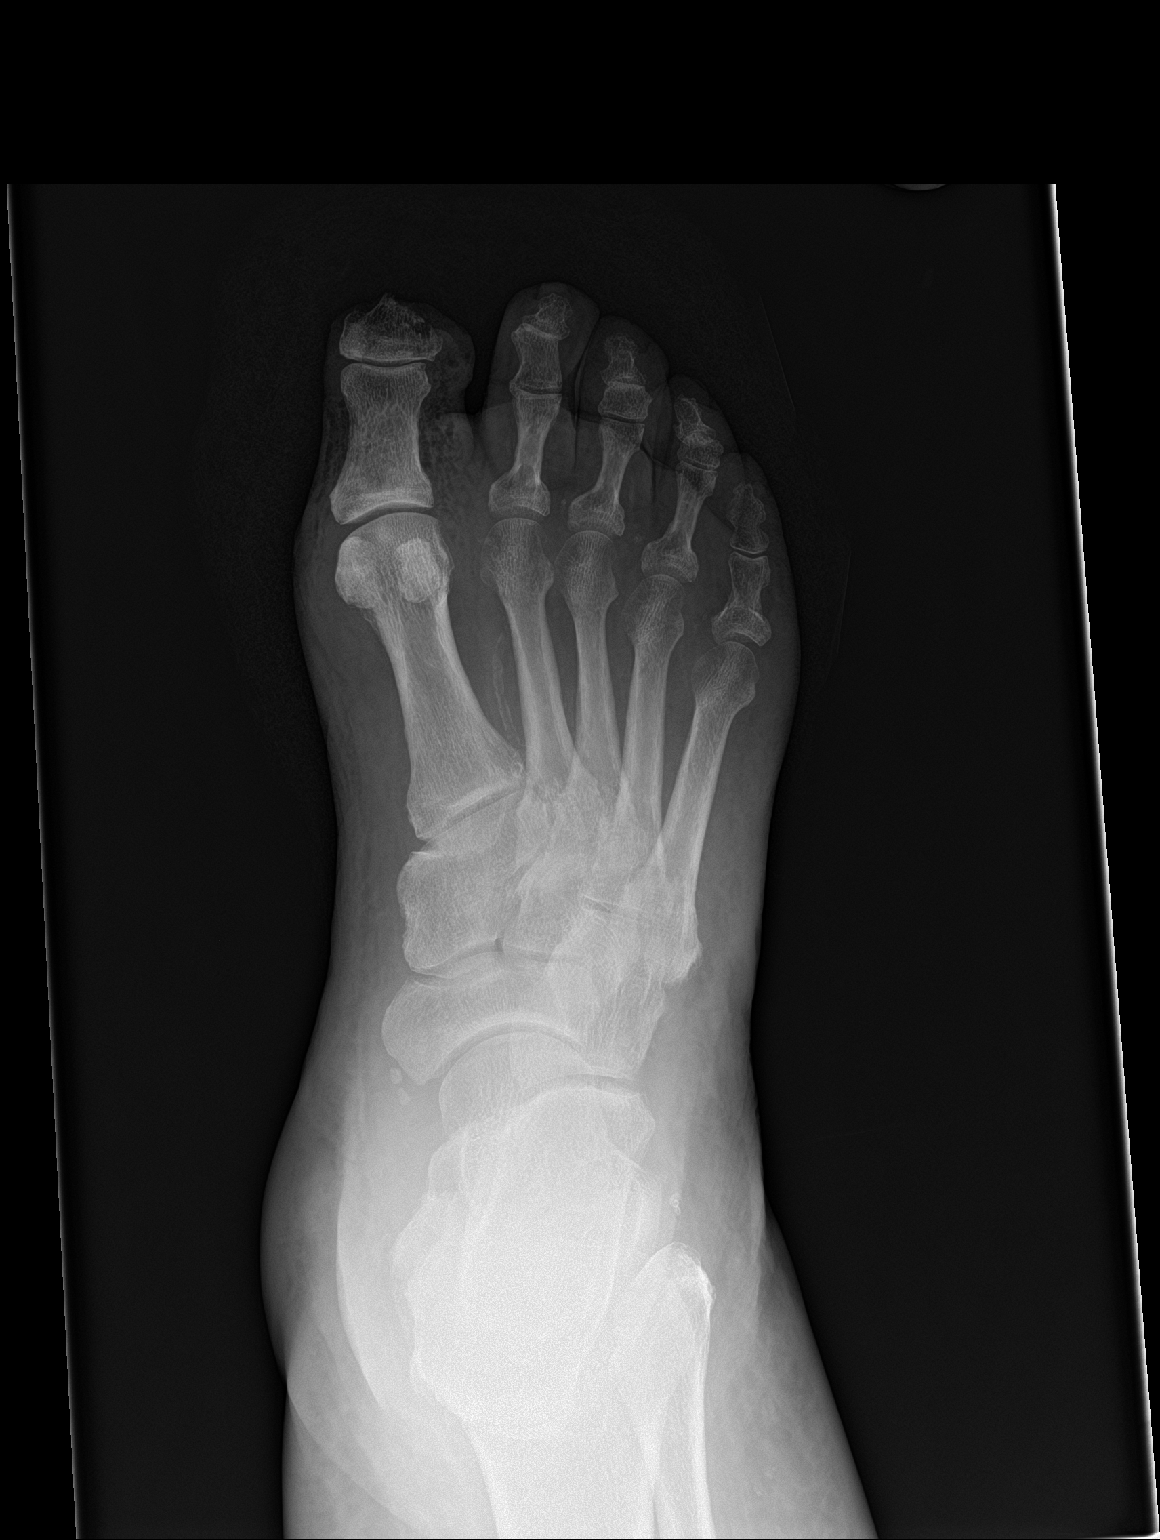

[foot obl]
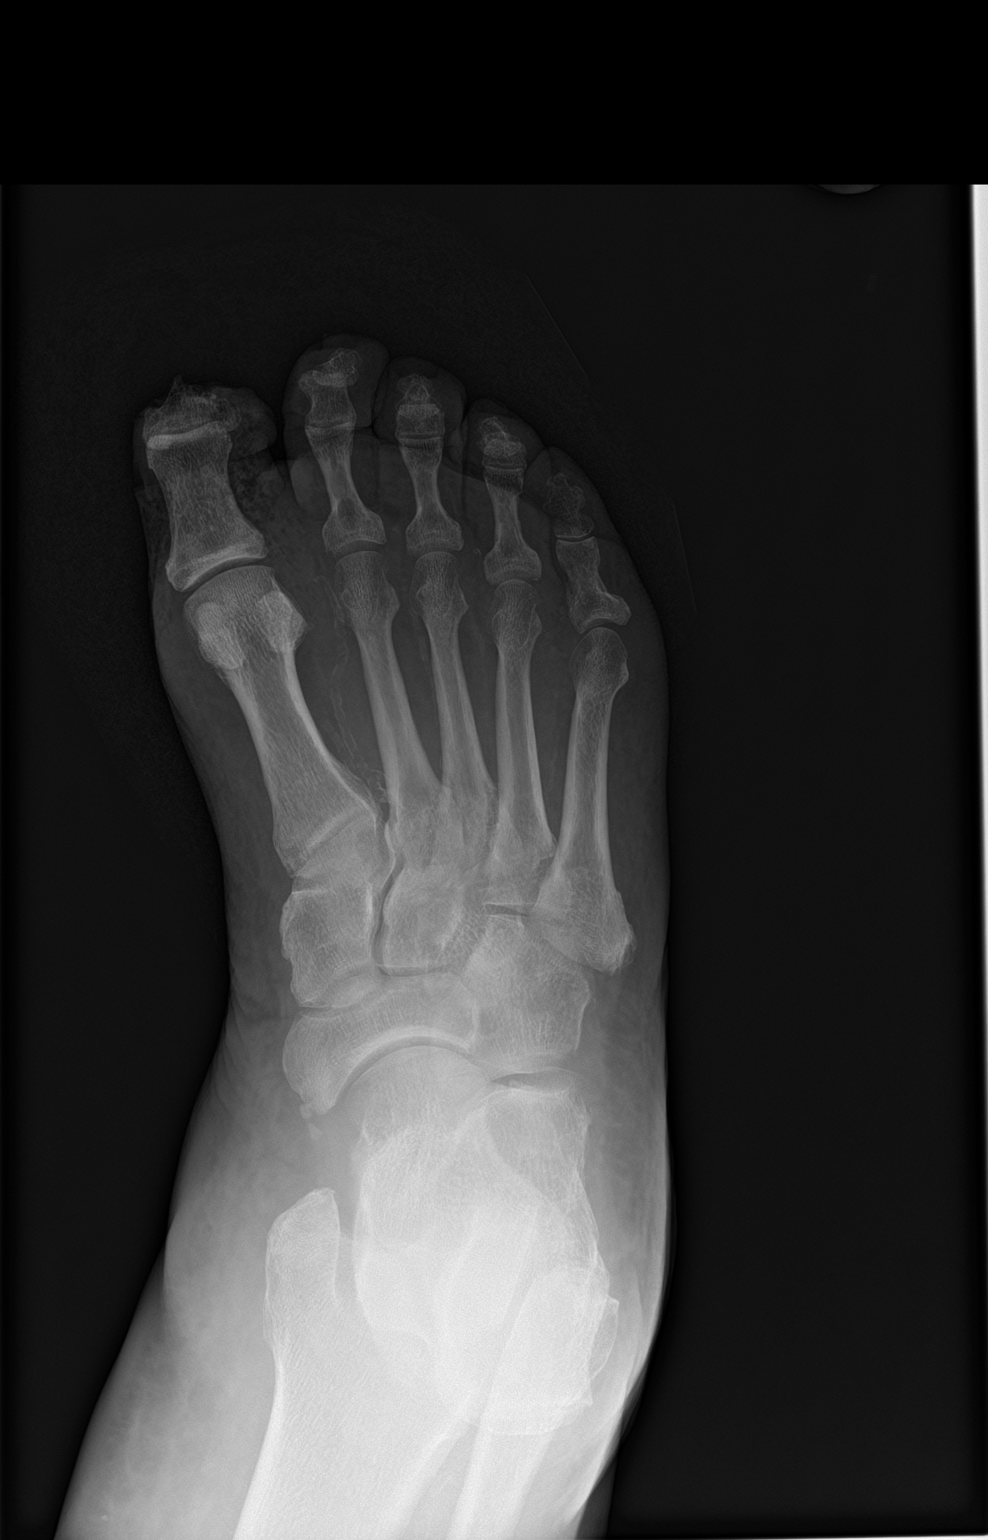

[foot lat]
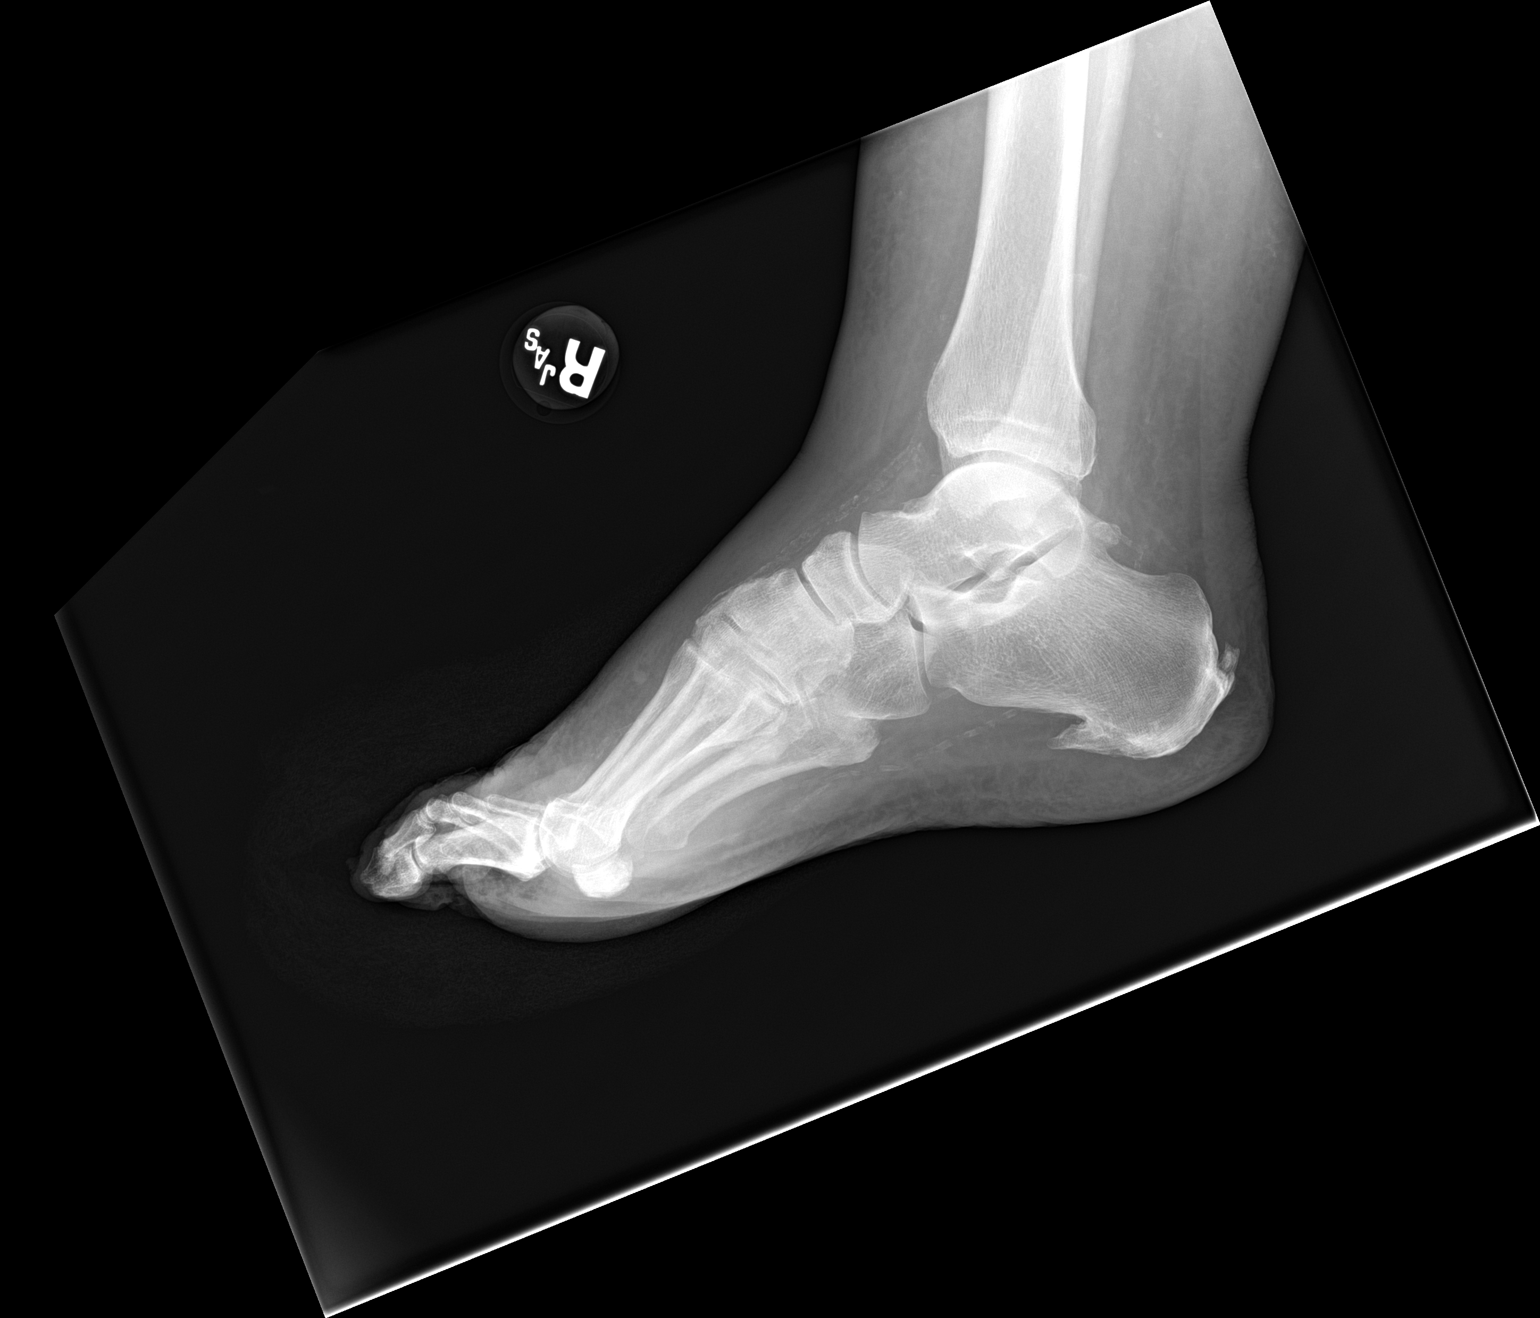

[3 of 3 positions shown; findings below may reference images not displayed]

FINDINGS: There is erosion of much of the first phalanx, with a moth-eaten
appearance to the residual first phalanx. Surrounding soft tissue
air tracks proximally about the first metatarsophalangeal joint. The
appearance is suspicious for infection with a gas producing
organism. Necrotizing fasciitis is a concern.

No definite additional osseous erosions are characterized, though
evaluation for osteomyelitis is limited on radiograph. Diffuse
vascular calcifications are seen. Soft tissue swelling is noted
about the ankle. Plantar and posterior calcaneal spurs are seen. An
os trigonum is noted. Small os naviculare fragments are seen.
IMPRESSION: 1. Erosion of much of the first phalanx, with a moth-eaten
appearance to the residual first phalanx, compatible with severe
osteomyelitis. Surrounding soft tissue air tracks proximally about
the first metatarsophalangeal joint. The appearance is suspicious
for infection with a gas producing organism. Necrotizing fasciitis
is a concern.
2. No definite additional osseous erosions seen, though evaluation
for additional osteomyelitis is limited on radiograph.
3. Diffuse vascular calcifications seen.
4. Os trigonum noted.  Small os naviculare fragments seen.
These results were called by telephone at the time of interpretation
on 01/30/2016 at [DATE] to Dr. Nya, who verbally acknowledged
these results.

## 2018-06-25 ENCOUNTER — Ambulatory Visit (HOSPITAL_COMMUNITY)
Admission: RE | Admit: 2018-06-25 | Discharge: 2018-06-25 | Disposition: A | Discharge status: Home / Self Care | Payer: Medicare Other | Source: Ambulatory Visit | Attending: Internal Medicine | Admitting: Internal Medicine

## 2018-06-25 ENCOUNTER — Other Ambulatory Visit (HOSPITAL_BASED_OUTPATIENT_CLINIC_OR_DEPARTMENT_OTHER): Payer: Self-pay | Admitting: Internal Medicine

## 2018-06-25 ENCOUNTER — Encounter (HOSPITAL_BASED_OUTPATIENT_CLINIC_OR_DEPARTMENT_OTHER): Payer: Medicare Other | Attending: Internal Medicine

## 2018-06-25 ENCOUNTER — Other Ambulatory Visit (HOSPITAL_COMMUNITY)
Admission: RE | Admit: 2018-06-25 | Discharge: 2018-06-25 | Disposition: A | Discharge status: Home / Self Care | Payer: Medicare Other | Attending: Internal Medicine | Admitting: Internal Medicine

## 2018-06-25 DIAGNOSIS — E11621 Type 2 diabetes mellitus with foot ulcer: Secondary | ICD-10-CM | POA: Diagnosis present

## 2018-06-25 DIAGNOSIS — L97519 Non-pressure chronic ulcer of other part of right foot with unspecified severity: Secondary | ICD-10-CM | POA: Insufficient documentation

## 2018-06-25 DIAGNOSIS — E1140 Type 2 diabetes mellitus with diabetic neuropathy, unspecified: Secondary | ICD-10-CM | POA: Insufficient documentation

## 2018-06-25 DIAGNOSIS — I10 Essential (primary) hypertension: Secondary | ICD-10-CM | POA: Insufficient documentation

## 2018-06-25 DIAGNOSIS — I2510 Atherosclerotic heart disease of native coronary artery without angina pectoris: Secondary | ICD-10-CM | POA: Diagnosis not present

## 2018-06-25 DIAGNOSIS — Z951 Presence of aortocoronary bypass graft: Secondary | ICD-10-CM | POA: Diagnosis not present

## 2018-06-25 DIAGNOSIS — Z89431 Acquired absence of right foot: Secondary | ICD-10-CM | POA: Insufficient documentation

## 2018-06-25 DIAGNOSIS — L97514 Non-pressure chronic ulcer of other part of right foot with necrosis of bone: Secondary | ICD-10-CM

## 2018-06-25 DIAGNOSIS — L97512 Non-pressure chronic ulcer of other part of right foot with fat layer exposed: Secondary | ICD-10-CM | POA: Insufficient documentation

## 2018-06-25 DIAGNOSIS — Z7984 Long term (current) use of oral hypoglycemic drugs: Secondary | ICD-10-CM | POA: Insufficient documentation

## 2018-06-25 DIAGNOSIS — E1151 Type 2 diabetes mellitus with diabetic peripheral angiopathy without gangrene: Secondary | ICD-10-CM | POA: Diagnosis not present

## 2018-06-28 LAB — AEROBIC CULTURE  (SUPERFICIAL SPECIMEN)

## 2018-06-28 LAB — AEROBIC CULTURE W GRAM STAIN (SUPERFICIAL SPECIMEN): Culture: NORMAL

## 2018-07-02 ENCOUNTER — Encounter (HOSPITAL_BASED_OUTPATIENT_CLINIC_OR_DEPARTMENT_OTHER): Payer: Medicare Other | Attending: Internal Medicine

## 2018-07-02 DIAGNOSIS — Z89431 Acquired absence of right foot: Secondary | ICD-10-CM | POA: Diagnosis not present

## 2018-07-02 DIAGNOSIS — L84 Corns and callosities: Secondary | ICD-10-CM | POA: Insufficient documentation

## 2018-07-02 DIAGNOSIS — E1140 Type 2 diabetes mellitus with diabetic neuropathy, unspecified: Secondary | ICD-10-CM | POA: Diagnosis not present

## 2018-07-02 DIAGNOSIS — E11621 Type 2 diabetes mellitus with foot ulcer: Secondary | ICD-10-CM | POA: Diagnosis not present

## 2018-07-02 DIAGNOSIS — I2510 Atherosclerotic heart disease of native coronary artery without angina pectoris: Secondary | ICD-10-CM | POA: Insufficient documentation

## 2018-07-02 DIAGNOSIS — I10 Essential (primary) hypertension: Secondary | ICD-10-CM | POA: Diagnosis not present

## 2018-07-02 DIAGNOSIS — L97514 Non-pressure chronic ulcer of other part of right foot with necrosis of bone: Secondary | ICD-10-CM | POA: Diagnosis not present

## 2018-07-02 DIAGNOSIS — E1151 Type 2 diabetes mellitus with diabetic peripheral angiopathy without gangrene: Secondary | ICD-10-CM | POA: Diagnosis not present

## 2018-07-03 ENCOUNTER — Other Ambulatory Visit (HOSPITAL_BASED_OUTPATIENT_CLINIC_OR_DEPARTMENT_OTHER): Payer: Self-pay | Admitting: Internal Medicine

## 2018-07-03 DIAGNOSIS — E13621 Other specified diabetes mellitus with foot ulcer: Secondary | ICD-10-CM

## 2018-07-03 DIAGNOSIS — L97506 Non-pressure chronic ulcer of other part of unspecified foot with bone involvement without evidence of necrosis: Principal | ICD-10-CM

## 2018-07-08 ENCOUNTER — Ambulatory Visit (HOSPITAL_COMMUNITY)
Admission: RE | Admit: 2018-07-08 | Discharge: 2018-07-08 | Disposition: A | Discharge status: Home / Self Care | Payer: Medicare Other | Source: Ambulatory Visit | Attending: Internal Medicine | Admitting: Internal Medicine

## 2018-07-08 DIAGNOSIS — E11621 Type 2 diabetes mellitus with foot ulcer: Secondary | ICD-10-CM | POA: Diagnosis present

## 2018-07-08 DIAGNOSIS — L97506 Non-pressure chronic ulcer of other part of unspecified foot with bone involvement without evidence of necrosis: Secondary | ICD-10-CM

## 2018-07-08 DIAGNOSIS — Z89431 Acquired absence of right foot: Secondary | ICD-10-CM | POA: Diagnosis not present

## 2018-07-08 DIAGNOSIS — E13621 Other specified diabetes mellitus with foot ulcer: Secondary | ICD-10-CM

## 2018-07-08 DIAGNOSIS — L97509 Non-pressure chronic ulcer of other part of unspecified foot with unspecified severity: Secondary | ICD-10-CM | POA: Diagnosis not present

## 2018-07-08 LAB — POCT I-STAT CREATININE: CREATININE: 1.2 mg/dL (ref 0.61–1.24)

## 2018-07-08 MED ORDER — GADOBENATE DIMEGLUMINE 529 MG/ML IV SOLN
20.0000 mL | Freq: Once | INTRAVENOUS | Status: AC | PRN
  Administered 2018-07-08: 20 mL via INTRAVENOUS

## 2018-07-09 ENCOUNTER — Encounter (HOSPITAL_BASED_OUTPATIENT_CLINIC_OR_DEPARTMENT_OTHER): Payer: Medicare Other

## 2018-07-09 DIAGNOSIS — E11621 Type 2 diabetes mellitus with foot ulcer: Secondary | ICD-10-CM | POA: Diagnosis not present

## 2018-07-16 DIAGNOSIS — E11621 Type 2 diabetes mellitus with foot ulcer: Secondary | ICD-10-CM | POA: Diagnosis not present

## 2018-07-23 DIAGNOSIS — E11621 Type 2 diabetes mellitus with foot ulcer: Secondary | ICD-10-CM | POA: Diagnosis not present

## 2018-07-30 ENCOUNTER — Encounter (HOSPITAL_BASED_OUTPATIENT_CLINIC_OR_DEPARTMENT_OTHER): Payer: Medicare Other | Attending: Internal Medicine

## 2018-07-30 DIAGNOSIS — I2510 Atherosclerotic heart disease of native coronary artery without angina pectoris: Secondary | ICD-10-CM | POA: Diagnosis not present

## 2018-07-30 DIAGNOSIS — E1140 Type 2 diabetes mellitus with diabetic neuropathy, unspecified: Secondary | ICD-10-CM | POA: Insufficient documentation

## 2018-07-30 DIAGNOSIS — E11621 Type 2 diabetes mellitus with foot ulcer: Secondary | ICD-10-CM | POA: Diagnosis present

## 2018-07-30 DIAGNOSIS — L97521 Non-pressure chronic ulcer of other part of left foot limited to breakdown of skin: Secondary | ICD-10-CM | POA: Insufficient documentation

## 2018-07-30 DIAGNOSIS — E1151 Type 2 diabetes mellitus with diabetic peripheral angiopathy without gangrene: Secondary | ICD-10-CM | POA: Insufficient documentation

## 2018-07-30 DIAGNOSIS — L97514 Non-pressure chronic ulcer of other part of right foot with necrosis of bone: Secondary | ICD-10-CM | POA: Diagnosis not present

## 2018-07-30 DIAGNOSIS — Z89431 Acquired absence of right foot: Secondary | ICD-10-CM | POA: Diagnosis not present

## 2018-07-30 DIAGNOSIS — I10 Essential (primary) hypertension: Secondary | ICD-10-CM | POA: Diagnosis not present

## 2018-08-06 DIAGNOSIS — E11621 Type 2 diabetes mellitus with foot ulcer: Secondary | ICD-10-CM | POA: Diagnosis not present

## 2018-08-13 DIAGNOSIS — E11621 Type 2 diabetes mellitus with foot ulcer: Secondary | ICD-10-CM | POA: Diagnosis not present

## 2018-08-20 DIAGNOSIS — E11621 Type 2 diabetes mellitus with foot ulcer: Secondary | ICD-10-CM | POA: Diagnosis not present

## 2018-08-27 DIAGNOSIS — E11621 Type 2 diabetes mellitus with foot ulcer: Secondary | ICD-10-CM | POA: Diagnosis not present

## 2018-09-03 ENCOUNTER — Encounter (HOSPITAL_BASED_OUTPATIENT_CLINIC_OR_DEPARTMENT_OTHER): Payer: Medicare Other | Attending: Internal Medicine

## 2018-09-03 DIAGNOSIS — Z794 Long term (current) use of insulin: Secondary | ICD-10-CM | POA: Diagnosis not present

## 2018-09-03 DIAGNOSIS — E11621 Type 2 diabetes mellitus with foot ulcer: Secondary | ICD-10-CM | POA: Insufficient documentation

## 2018-09-03 DIAGNOSIS — L97512 Non-pressure chronic ulcer of other part of right foot with fat layer exposed: Secondary | ICD-10-CM | POA: Insufficient documentation

## 2018-09-03 DIAGNOSIS — E1140 Type 2 diabetes mellitus with diabetic neuropathy, unspecified: Secondary | ICD-10-CM | POA: Diagnosis not present

## 2018-09-03 DIAGNOSIS — L97529 Non-pressure chronic ulcer of other part of left foot with unspecified severity: Secondary | ICD-10-CM | POA: Insufficient documentation

## 2018-09-03 DIAGNOSIS — I2510 Atherosclerotic heart disease of native coronary artery without angina pectoris: Secondary | ICD-10-CM | POA: Insufficient documentation

## 2018-09-03 DIAGNOSIS — I10 Essential (primary) hypertension: Secondary | ICD-10-CM | POA: Diagnosis not present

## 2018-09-03 DIAGNOSIS — E1151 Type 2 diabetes mellitus with diabetic peripheral angiopathy without gangrene: Secondary | ICD-10-CM | POA: Diagnosis not present

## 2018-09-10 ENCOUNTER — Other Ambulatory Visit (HOSPITAL_COMMUNITY)
Admission: RE | Admit: 2018-09-10 | Discharge: 2018-09-10 | Disposition: A | Discharge status: Home / Self Care | Payer: Medicare Other | Source: Other Acute Inpatient Hospital | Attending: Internal Medicine | Admitting: Internal Medicine

## 2018-09-10 DIAGNOSIS — L97514 Non-pressure chronic ulcer of other part of right foot with necrosis of bone: Secondary | ICD-10-CM | POA: Insufficient documentation

## 2018-09-10 DIAGNOSIS — E11621 Type 2 diabetes mellitus with foot ulcer: Secondary | ICD-10-CM | POA: Insufficient documentation

## 2018-09-16 LAB — AEROBIC CULTURE W GRAM STAIN (SUPERFICIAL SPECIMEN): Culture: NORMAL

## 2018-09-16 LAB — AEROBIC CULTURE  (SUPERFICIAL SPECIMEN)

## 2018-09-20 DIAGNOSIS — E11621 Type 2 diabetes mellitus with foot ulcer: Secondary | ICD-10-CM | POA: Diagnosis not present

## 2018-09-30 ENCOUNTER — Encounter (HOSPITAL_BASED_OUTPATIENT_CLINIC_OR_DEPARTMENT_OTHER): Payer: Medicare Other | Attending: Internal Medicine

## 2018-09-30 DIAGNOSIS — I10 Essential (primary) hypertension: Secondary | ICD-10-CM | POA: Insufficient documentation

## 2018-09-30 DIAGNOSIS — E1151 Type 2 diabetes mellitus with diabetic peripheral angiopathy without gangrene: Secondary | ICD-10-CM | POA: Insufficient documentation

## 2018-09-30 DIAGNOSIS — E1140 Type 2 diabetes mellitus with diabetic neuropathy, unspecified: Secondary | ICD-10-CM | POA: Insufficient documentation

## 2018-09-30 DIAGNOSIS — E11621 Type 2 diabetes mellitus with foot ulcer: Secondary | ICD-10-CM | POA: Insufficient documentation

## 2018-09-30 DIAGNOSIS — Z89431 Acquired absence of right foot: Secondary | ICD-10-CM | POA: Diagnosis not present

## 2018-09-30 DIAGNOSIS — L97522 Non-pressure chronic ulcer of other part of left foot with fat layer exposed: Secondary | ICD-10-CM | POA: Insufficient documentation

## 2018-09-30 DIAGNOSIS — I2510 Atherosclerotic heart disease of native coronary artery without angina pectoris: Secondary | ICD-10-CM | POA: Insufficient documentation

## 2018-09-30 DIAGNOSIS — L97512 Non-pressure chronic ulcer of other part of right foot with fat layer exposed: Secondary | ICD-10-CM | POA: Insufficient documentation

## 2018-10-07 DIAGNOSIS — E11621 Type 2 diabetes mellitus with foot ulcer: Secondary | ICD-10-CM | POA: Diagnosis not present

## 2018-10-14 DIAGNOSIS — E11621 Type 2 diabetes mellitus with foot ulcer: Secondary | ICD-10-CM | POA: Diagnosis not present

## 2018-10-21 DIAGNOSIS — E11621 Type 2 diabetes mellitus with foot ulcer: Secondary | ICD-10-CM | POA: Diagnosis not present

## 2018-10-28 ENCOUNTER — Other Ambulatory Visit (HOSPITAL_COMMUNITY): Payer: Self-pay | Admitting: Internal Medicine

## 2018-10-28 ENCOUNTER — Ambulatory Visit (HOSPITAL_COMMUNITY)
Admission: RE | Admit: 2018-10-28 | Discharge: 2018-10-28 | Disposition: A | Discharge status: Home / Self Care | Payer: Medicare Other | Source: Ambulatory Visit | Attending: Internal Medicine | Admitting: Internal Medicine

## 2018-10-28 DIAGNOSIS — L97509 Non-pressure chronic ulcer of other part of unspecified foot with unspecified severity: Principal | ICD-10-CM

## 2018-10-28 DIAGNOSIS — M869 Osteomyelitis, unspecified: Secondary | ICD-10-CM | POA: Diagnosis present

## 2018-10-28 DIAGNOSIS — E1169 Type 2 diabetes mellitus with other specified complication: Principal | ICD-10-CM

## 2018-10-28 DIAGNOSIS — E11621 Type 2 diabetes mellitus with foot ulcer: Secondary | ICD-10-CM

## 2018-11-04 ENCOUNTER — Encounter (HOSPITAL_BASED_OUTPATIENT_CLINIC_OR_DEPARTMENT_OTHER): Payer: Medicare Other | Attending: Internal Medicine

## 2018-11-04 DIAGNOSIS — L97521 Non-pressure chronic ulcer of other part of left foot limited to breakdown of skin: Secondary | ICD-10-CM | POA: Diagnosis not present

## 2018-11-04 DIAGNOSIS — M86172 Other acute osteomyelitis, left ankle and foot: Secondary | ICD-10-CM | POA: Insufficient documentation

## 2018-11-04 DIAGNOSIS — I10 Essential (primary) hypertension: Secondary | ICD-10-CM | POA: Insufficient documentation

## 2018-11-04 DIAGNOSIS — E1151 Type 2 diabetes mellitus with diabetic peripheral angiopathy without gangrene: Secondary | ICD-10-CM | POA: Diagnosis not present

## 2018-11-04 DIAGNOSIS — E11621 Type 2 diabetes mellitus with foot ulcer: Secondary | ICD-10-CM | POA: Diagnosis present

## 2018-11-04 DIAGNOSIS — E1169 Type 2 diabetes mellitus with other specified complication: Secondary | ICD-10-CM | POA: Diagnosis not present

## 2018-11-04 DIAGNOSIS — L97514 Non-pressure chronic ulcer of other part of right foot with necrosis of bone: Secondary | ICD-10-CM | POA: Diagnosis not present

## 2018-11-04 DIAGNOSIS — E1140 Type 2 diabetes mellitus with diabetic neuropathy, unspecified: Secondary | ICD-10-CM | POA: Insufficient documentation

## 2018-11-04 DIAGNOSIS — I2510 Atherosclerotic heart disease of native coronary artery without angina pectoris: Secondary | ICD-10-CM | POA: Diagnosis not present

## 2018-11-15 ENCOUNTER — Other Ambulatory Visit (HOSPITAL_COMMUNITY)
Admission: RE | Admit: 2018-11-15 | Discharge: 2018-11-15 | Disposition: A | Discharge status: Home / Self Care | Payer: Medicare Other | Source: Other Acute Inpatient Hospital | Attending: Internal Medicine | Admitting: Internal Medicine

## 2018-11-15 DIAGNOSIS — L97514 Non-pressure chronic ulcer of other part of right foot with necrosis of bone: Secondary | ICD-10-CM | POA: Insufficient documentation

## 2018-11-15 DIAGNOSIS — E11621 Type 2 diabetes mellitus with foot ulcer: Secondary | ICD-10-CM | POA: Diagnosis not present

## 2018-11-18 LAB — AEROBIC CULTURE W GRAM STAIN (SUPERFICIAL SPECIMEN): Culture: NORMAL

## 2018-11-18 LAB — AEROBIC CULTURE  (SUPERFICIAL SPECIMEN)

## 2018-11-22 ENCOUNTER — Other Ambulatory Visit: Payer: Self-pay | Admitting: Internal Medicine

## 2018-11-22 ENCOUNTER — Other Ambulatory Visit (HOSPITAL_COMMUNITY): Payer: Self-pay | Admitting: Internal Medicine

## 2018-11-22 DIAGNOSIS — E13621 Other specified diabetes mellitus with foot ulcer: Secondary | ICD-10-CM

## 2018-11-22 DIAGNOSIS — L97509 Non-pressure chronic ulcer of other part of unspecified foot with unspecified severity: Principal | ICD-10-CM

## 2018-11-22 DIAGNOSIS — E11621 Type 2 diabetes mellitus with foot ulcer: Secondary | ICD-10-CM | POA: Diagnosis not present

## 2018-11-27 ENCOUNTER — Ambulatory Visit (HOSPITAL_COMMUNITY)
Admission: RE | Admit: 2018-11-27 | Discharge: 2018-11-27 | Disposition: A | Discharge status: Home / Self Care | Payer: Medicare Other | Source: Ambulatory Visit | Attending: Internal Medicine | Admitting: Internal Medicine

## 2018-11-27 DIAGNOSIS — E13621 Other specified diabetes mellitus with foot ulcer: Secondary | ICD-10-CM | POA: Diagnosis present

## 2018-11-27 DIAGNOSIS — L97509 Non-pressure chronic ulcer of other part of unspecified foot with unspecified severity: Secondary | ICD-10-CM | POA: Diagnosis present

## 2018-11-27 LAB — POCT I-STAT CREATININE: Creatinine, Ser: 1.3 mg/dL — ABNORMAL HIGH (ref 0.61–1.24)

## 2018-11-27 MED ORDER — GADOBUTROL 1 MMOL/ML IV SOLN
10.0000 mL | Freq: Once | INTRAVENOUS | Status: AC | PRN
  Administered 2018-11-27: 9 mL via INTRAVENOUS

## 2018-11-29 DIAGNOSIS — E11621 Type 2 diabetes mellitus with foot ulcer: Secondary | ICD-10-CM | POA: Diagnosis not present

## 2018-12-04 ENCOUNTER — Encounter: Payer: Self-pay | Admitting: Internal Medicine

## 2018-12-04 ENCOUNTER — Ambulatory Visit (INDEPENDENT_AMBULATORY_CARE_PROVIDER_SITE_OTHER): Payer: Medicare Other | Admitting: Internal Medicine

## 2018-12-04 VITALS — BP 150/83 | HR 80 | Temp 97.9°F | Wt 209.0 lb

## 2018-12-04 DIAGNOSIS — M86472 Chronic osteomyelitis with draining sinus, left ankle and foot: Secondary | ICD-10-CM

## 2018-12-04 NOTE — Progress Notes (Signed)
[facility]      Reason for Consult: left great toe osteomyelitis    Referring Physician: Dr. Leanord Hawking    Patient ID: [name], male    DOB: 03-17-1946, 73 y.o.   MRN: [id number]  HPI:   Here for evaluation of above.   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 Pernell Dupre at [facility] [place] who developed an ulcer at the end of his left great toe that started as a blister-like process likely from an ingrown toenail and underwent debridement by Dr. Loreta Ave and noted a 'spur' on an xray (not available) in October 2019.  He continued to require debridments of non-viable tissue and an xray on 12/30 noted osteomyelitis at the tip of the left great toe at the site of the ulcer.  He was placed empirically on doxycyclie by Dr. Leanord Hawking  In early January and has been on this for nearly 5 weeks.  Since then, the ulcer has healed, no pus drainage and no erythema or warmth.  There is a small, about 1 mm area Dr. Leanord Hawking is still monitoring but otherwise is without symptoms.   XRAY independently reviewed and small areas of lytic lesions noted.  Previous record reviewed in Epic from Dr. Pernell Dupre and has had two stents placed.    Past Medical History:  Diagnosis Date  . Cellulitis and abscess of toe 12/2015   rt great toe   . Diabetes mellitus without complication (HCC)   . Heart problem   . History of kidney stones   . Hypertension     Prior to Admission medications   Medication Sig Start Date End Date Taking? Authorizing Provider  furosemide (LASIX) 40 MG tablet Take 40 mg by mouth daily.   Yes [provider]  HYDROmorphone (DILAUDID) 2 MG tablet Take 1-2 tablets (2-4 mg total) by mouth every 4 (four) hours as needed for moderate pain or severe pain. 02/03/16  Yes Zannie Cove, MD  insulin aspart (NOVOLOG FLEXPEN) 100 UNIT/ML FlexPen Inject 5 Units into the skin 3 (three) times daily with meals. Patient taking differently: Inject 12 Units into the skin 3 (three) times daily  with meals. Sliding Scale 100 or below 12 units 101-150 14 units 151-200 16 units 201-250 18 units 251-300 20 units 301-350 22 units Over 350 call MD Over 350  Call PCP 02/03/16  Yes Zannie Cove, MD  Insulin Glargine (LANTUS SOLOSTAR) 100 UNIT/ML Solostar Pen Inject 25 Units into the skin daily at 10 pm. Patient taking differently: Inject 24 Units into the skin daily at 10 pm. Per patient he is taking 24 units twice daily  At 9:00 am and 9:00 pm 02/03/16  Yes Zannie Cove, MD  Insulin Pen Needle 31G X 5 MM MISC 1 each by Does not apply route 3 (three) times daily before meals. 02/03/16  Yes Zannie Cove, MD  lisinopril (PRINIVIL,ZESTRIL) 10 MG tablet Take 10 mg by mouth daily.   Yes [provider]  loratadine (CLARITIN) 10 MG tablet Take 10 mg by mouth daily.   Yes [provider]  metFORMIN (GLUCOPHAGE) 1000 MG tablet Take 1,000 mg by mouth 2 (two) times daily with a meal.   Yes [provider]  metoprolol (LOPRESSOR) 50 MG tablet Take 50 mg by mouth 3 (three) times daily.   Yes [provider]  Multiple Vitamin (MULTIVITAMIN WITH MINERALS) TABS tablet Take 1 tablet by mouth daily.   Yes [provider]  nitroGLYCERIN (NITROSTAT) 0.4 MG  SL tablet Place 0.4 mg under the tongue every 5 (five) minutes as needed for chest pain.  02/24/16  Yes [provider]  omeprazole (PRILOSEC OTC) 20 MG tablet Take 20 mg by mouth daily.   Yes [provider]  simvastatin (ZOCOR) 80 MG tablet Take 80 mg by mouth daily at 6 PM.   Yes [provider]    No Known Allergies  Social History   Tobacco Use  . Smoking status: Never Smoker  . Smokeless tobacco: Never Used  Substance Use Topics  . Alcohol use: Yes    Alcohol/week: 0.0 standard drinks    Comment: occasional  . Drug use: No   PMH: PAD, CAD, history of CABG, HTN, hyperlipidemia, DM  Review of Systems  Constitutional: negative for fevers and chills Gastrointestinal:  negative for nausea and diarrhea Integument/breast: negative for rash All other systems reviewed and are negative    Constitutional: in no apparent distress  Vitals:   12/04/18 1340  BP: (!) 150/83  Pulse: 80  Temp: 97.9 F (36.6 C)   EYES: anicteric ENMT: no thrush Cardiovascular: Cor RRR Respiratory: CTA B; normal respiratory effort Musculoskeletal: left great toe with no swelling, no warmth, dried, closed ulcer area Skin: no rash Neuro: non-focal  Labs: Lab Results  Component Value Date   WBC 8.7 02/03/2016   HGB 11.2 (L) 05/15/2016   HCT 33.0 (L) 05/15/2016   MCV 76.3 (L) 02/03/2016   PLT 366 02/03/2016    Lab Results  Component Value Date   CREATININE 1.30 (H) 11/27/2018   BUN 30 (H) 05/15/2016   NA 142 05/15/2016   K 4.5 05/15/2016   CL 110 05/15/2016   CO2 24 02/03/2016    Lab Results  Component Value Date   ALT 31 01/29/2016   AST 31 01/29/2016   ALKPHOS 83 01/29/2016   BILITOT 0.4 01/29/2016   INR 1.34 01/30/2016     Assessment: treated osteomyelitis, chronic.  At this time, he has responded well to doxycycline with closure of the wound, drying, no new symptoms.  No further antibiotics indicated at this time.    Antibiotic allergy.   Plan: 1) observe off of antibiotics 2) consider repeat xray of toe in 1-2 months to monitor stability 3) daptomycin listed as an allergy based on his history

## 2018-12-06 ENCOUNTER — Encounter (HOSPITAL_BASED_OUTPATIENT_CLINIC_OR_DEPARTMENT_OTHER): Payer: Medicare Other | Attending: Internal Medicine

## 2018-12-06 DIAGNOSIS — E1151 Type 2 diabetes mellitus with diabetic peripheral angiopathy without gangrene: Secondary | ICD-10-CM | POA: Diagnosis not present

## 2018-12-06 DIAGNOSIS — E1169 Type 2 diabetes mellitus with other specified complication: Secondary | ICD-10-CM | POA: Diagnosis not present

## 2018-12-06 DIAGNOSIS — E11621 Type 2 diabetes mellitus with foot ulcer: Secondary | ICD-10-CM | POA: Insufficient documentation

## 2018-12-06 DIAGNOSIS — L97526 Non-pressure chronic ulcer of other part of left foot with bone involvement without evidence of necrosis: Secondary | ICD-10-CM | POA: Insufficient documentation

## 2018-12-06 DIAGNOSIS — B954 Other streptococcus as the cause of diseases classified elsewhere: Secondary | ICD-10-CM | POA: Diagnosis not present

## 2018-12-06 DIAGNOSIS — Z89431 Acquired absence of right foot: Secondary | ICD-10-CM | POA: Insufficient documentation

## 2018-12-06 DIAGNOSIS — M86172 Other acute osteomyelitis, left ankle and foot: Secondary | ICD-10-CM | POA: Diagnosis not present

## 2018-12-13 ENCOUNTER — Other Ambulatory Visit (HOSPITAL_COMMUNITY)
Admission: RE | Admit: 2018-12-13 | Discharge: 2018-12-13 | Disposition: A | Discharge status: Home / Self Care | Payer: Medicare Other | Source: Other Acute Inpatient Hospital | Attending: Internal Medicine | Admitting: Internal Medicine

## 2018-12-13 DIAGNOSIS — E11621 Type 2 diabetes mellitus with foot ulcer: Secondary | ICD-10-CM | POA: Diagnosis not present

## 2018-12-13 DIAGNOSIS — L97514 Non-pressure chronic ulcer of other part of right foot with necrosis of bone: Secondary | ICD-10-CM | POA: Diagnosis present

## 2018-12-19 LAB — AEROBIC CULTURE  (SUPERFICIAL SPECIMEN)

## 2018-12-19 LAB — AEROBIC CULTURE W GRAM STAIN (SUPERFICIAL SPECIMEN): Gram Stain: NONE SEEN

## 2018-12-20 DIAGNOSIS — E11621 Type 2 diabetes mellitus with foot ulcer: Secondary | ICD-10-CM | POA: Diagnosis not present

## 2018-12-27 DIAGNOSIS — E11621 Type 2 diabetes mellitus with foot ulcer: Secondary | ICD-10-CM | POA: Diagnosis not present

## 2019-01-03 ENCOUNTER — Encounter (HOSPITAL_BASED_OUTPATIENT_CLINIC_OR_DEPARTMENT_OTHER): Payer: Medicare Other | Attending: Internal Medicine

## 2019-01-03 ENCOUNTER — Other Ambulatory Visit (HOSPITAL_COMMUNITY)
Admission: RE | Admit: 2019-01-03 | Discharge: 2019-01-03 | Disposition: A | Discharge status: Home / Self Care | Payer: Medicare Other | Source: Other Acute Inpatient Hospital | Attending: Internal Medicine | Admitting: Internal Medicine

## 2019-01-03 DIAGNOSIS — E1140 Type 2 diabetes mellitus with diabetic neuropathy, unspecified: Secondary | ICD-10-CM | POA: Diagnosis not present

## 2019-01-03 DIAGNOSIS — I10 Essential (primary) hypertension: Secondary | ICD-10-CM | POA: Diagnosis not present

## 2019-01-03 DIAGNOSIS — E11621 Type 2 diabetes mellitus with foot ulcer: Secondary | ICD-10-CM | POA: Insufficient documentation

## 2019-01-03 DIAGNOSIS — L97512 Non-pressure chronic ulcer of other part of right foot with fat layer exposed: Secondary | ICD-10-CM | POA: Diagnosis not present

## 2019-01-03 DIAGNOSIS — E1151 Type 2 diabetes mellitus with diabetic peripheral angiopathy without gangrene: Secondary | ICD-10-CM | POA: Insufficient documentation

## 2019-01-03 DIAGNOSIS — I2510 Atherosclerotic heart disease of native coronary artery without angina pectoris: Secondary | ICD-10-CM | POA: Diagnosis not present

## 2019-01-03 DIAGNOSIS — Z794 Long term (current) use of insulin: Secondary | ICD-10-CM | POA: Diagnosis not present

## 2019-01-07 LAB — AEROBIC CULTURE  (SUPERFICIAL SPECIMEN)

## 2019-01-07 LAB — AEROBIC CULTURE W GRAM STAIN (SUPERFICIAL SPECIMEN): Culture: NORMAL

## 2019-01-10 DIAGNOSIS — E11621 Type 2 diabetes mellitus with foot ulcer: Secondary | ICD-10-CM | POA: Diagnosis not present

## 2019-01-20 DIAGNOSIS — E11621 Type 2 diabetes mellitus with foot ulcer: Secondary | ICD-10-CM | POA: Diagnosis not present

## 2019-02-03 ENCOUNTER — Other Ambulatory Visit: Payer: Self-pay

## 2019-02-03 ENCOUNTER — Encounter (HOSPITAL_BASED_OUTPATIENT_CLINIC_OR_DEPARTMENT_OTHER): Payer: Medicare Other | Attending: Internal Medicine

## 2019-02-03 ENCOUNTER — Encounter (HOSPITAL_BASED_OUTPATIENT_CLINIC_OR_DEPARTMENT_OTHER): Payer: Self-pay

## 2019-02-03 DIAGNOSIS — L84 Corns and callosities: Secondary | ICD-10-CM | POA: Insufficient documentation

## 2019-02-03 DIAGNOSIS — Z8631 Personal history of diabetic foot ulcer: Secondary | ICD-10-CM | POA: Insufficient documentation

## 2019-02-03 DIAGNOSIS — E1151 Type 2 diabetes mellitus with diabetic peripheral angiopathy without gangrene: Secondary | ICD-10-CM | POA: Insufficient documentation

## 2019-02-03 DIAGNOSIS — Z09 Encounter for follow-up examination after completed treatment for conditions other than malignant neoplasm: Secondary | ICD-10-CM | POA: Diagnosis present

## 2019-07-29 ENCOUNTER — Other Ambulatory Visit: Payer: Self-pay

## 2019-07-29 ENCOUNTER — Encounter (HOSPITAL_BASED_OUTPATIENT_CLINIC_OR_DEPARTMENT_OTHER): Payer: Medicare Other | Attending: Internal Medicine

## 2019-07-29 DIAGNOSIS — I2510 Atherosclerotic heart disease of native coronary artery without angina pectoris: Secondary | ICD-10-CM | POA: Insufficient documentation

## 2019-07-29 DIAGNOSIS — E11621 Type 2 diabetes mellitus with foot ulcer: Secondary | ICD-10-CM | POA: Diagnosis not present

## 2019-07-29 DIAGNOSIS — E1151 Type 2 diabetes mellitus with diabetic peripheral angiopathy without gangrene: Secondary | ICD-10-CM | POA: Insufficient documentation

## 2019-07-29 DIAGNOSIS — L97522 Non-pressure chronic ulcer of other part of left foot with fat layer exposed: Secondary | ICD-10-CM | POA: Insufficient documentation

## 2019-07-29 DIAGNOSIS — Z794 Long term (current) use of insulin: Secondary | ICD-10-CM | POA: Diagnosis not present

## 2019-07-29 DIAGNOSIS — E1140 Type 2 diabetes mellitus with diabetic neuropathy, unspecified: Secondary | ICD-10-CM | POA: Diagnosis not present

## 2019-07-29 DIAGNOSIS — I10 Essential (primary) hypertension: Secondary | ICD-10-CM | POA: Diagnosis not present

## 2019-08-05 ENCOUNTER — Other Ambulatory Visit: Payer: Self-pay

## 2019-08-05 ENCOUNTER — Encounter (HOSPITAL_BASED_OUTPATIENT_CLINIC_OR_DEPARTMENT_OTHER): Payer: Medicare Other | Attending: Internal Medicine | Admitting: Internal Medicine

## 2019-08-05 DIAGNOSIS — I10 Essential (primary) hypertension: Secondary | ICD-10-CM | POA: Insufficient documentation

## 2019-08-05 DIAGNOSIS — E11621 Type 2 diabetes mellitus with foot ulcer: Secondary | ICD-10-CM | POA: Diagnosis present

## 2019-08-05 DIAGNOSIS — E1151 Type 2 diabetes mellitus with diabetic peripheral angiopathy without gangrene: Secondary | ICD-10-CM | POA: Diagnosis not present

## 2019-08-05 DIAGNOSIS — I2510 Atherosclerotic heart disease of native coronary artery without angina pectoris: Secondary | ICD-10-CM | POA: Diagnosis not present

## 2019-08-05 DIAGNOSIS — L03116 Cellulitis of left lower limb: Secondary | ICD-10-CM | POA: Insufficient documentation

## 2019-08-05 DIAGNOSIS — B957 Other staphylococcus as the cause of diseases classified elsewhere: Secondary | ICD-10-CM | POA: Diagnosis not present

## 2019-08-05 DIAGNOSIS — L97522 Non-pressure chronic ulcer of other part of left foot with fat layer exposed: Secondary | ICD-10-CM | POA: Diagnosis not present

## 2019-08-05 DIAGNOSIS — E1140 Type 2 diabetes mellitus with diabetic neuropathy, unspecified: Secondary | ICD-10-CM | POA: Insufficient documentation

## 2019-08-05 NOTE — Progress Notes (Signed)
[name], [name] ([id number]) Visit Report for 08/05/2019 HPI Details Patient Name: Date of Service: [name], [name] 08/05/2019 1:00 PM Medical Record [id number] Patient Account Number: [id number] Date of Birth/Sex: Treating RN: [id number] (73 y.o. [name]) [name] Primary Care Provider: [name] Other Clinician: Referring Provider: Treating Provider/Extender:[name], [name], [name] in Treatment: 1 History of Present Illness HPI Description: 05/18/16; this is a 73year-old diabetic who is a type II diabetic on insulin. The history is that he traumatized his right foot developed a sore sometime in late March. 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 According to the patient and his wife this wound never really healed. He was seen on 2 occasions in the wound care center in [place] and had vascular studies and then was referred urgently to Dr. Bridgett Larsson of vascular surgery. He underwent an angiogram on 05/10/16. Unfortunately nothing really could be done to improve his vascular status. He had a 75-90% stenosis in the midsegment of 1 segment of the posterior femoral artery. He had a patent popliteal, his anterior tibial occluded shortly after takeoff. Perineal had a greater than 90% stenosis posterior tibial is occluded feet had no distal collaterals feed distal aspect of the transmetatarsal amputation site. The patient tells me that he had a prolonged period of Santyl by Dr. Doran Durand was some initial improvement but then this was stopped. I think they're only applying daily dressings/dry dressings. He has not had a recent x-ray of the right foot he did have one before his surgery in April. His wife by the dimensions of the wound/surgical site being followed at  home since 4/20. At that point the dimensions were 0.5 x 12 x 0.2 on 7/19 this was 1.8 x 6 x 0.4. He is not currently on any antibiotics. His hemoglobin A1c in early April was 12.9 at that point he was started on insulin. Apparently his blood sugars are much lower he has an appointment with Dr. Legrand Como Alteimer of endocrine next week. 05/29/16 x-ray of the area did not show osteomyelitis. I think he probably needs an MRI at this point. His wife is asking about something called"Yireh" cream which is not FDA approved. I have not heard of this. 06/22/16; MRI did not really suggest osteomyelitis. There was minimal marrow edema and enhancement in the stump of the second metatarsal felt to be secondary likely to postoperative change rather than osteomyelitis. The patient has arranged his own consultation with Dr. Andree Elk at [place], apparently their daughter lives in [place] and has some connection here. Any improvement in vascular supply by Dr. Andree Elk would of course be helpful. Dr. Bridgett Larsson did not feel that anything further could be done other than amputation if wound care did not result in healing or if the area deteriorates. 06/26/16; the patient has been to see Dr. Andree Elk at [place] and had an angiogram. He is going for a procedure on Thursday which will involve catheterization. I'm not sure if this is an anterograde or retrograde approach. He has been using Santyl to the wound 07/10/16; the patient had a repeat angiogram and angioplasty at [place] by Dr. Brunetta Jeans. His angiogram showed right CFA and profundal widely patent. The right as of a.m. popliteal artery were widely patent the right anterior tibial was occluded proximally and reconstitutes at  the ankle. Peroneal artery was patent to the foot. Posterior tibial artery was occluded. The patient had angioplasty of the anterior tibial artery.. This was quite successful. He was recommended for Plavix as well as aspirin. 07/17/16; the patient was close to be a nurse  visit today however the outer dressing of the Apligraf fell off. Noted drainage. I was asked to see the wound. The patient is noted an odor however his wife had noted that. Drainage with Apligraf not necessarily a bad thing. He has not been systemically unwell 07/24/16; we are still have an issue with drainage of this wound. In spite of this I applied his second Apligraf. Medially the area still is probing to bone. 08/07/16; Apligraf reapplied in general wound looks improved. 08/21/16 Apligraf #4. Wound looks much better 09/04/16 patientt's wound again today continues to appear to improve with the application of the Apligraf's. He notes no increased discomfort or concerns at this point in time. 09/18/16; the patient returns today 2 weeks after his fifth application of Apligraf. Predictably three quarters of the width of this wound has healed. The deep area that probe to bone medially is still open. The patient asked how much out-of-pocket dollars would be for additional Apligraf's. 09/25/16; now using Hydrofera Blue. He has completed 5 Apligraf applications with considerable improvement in this deep open transmetatarsal amputation site. His wound is now a triangular-shaped wound on the medial aspect. At roughly 12 to 2:00 this probes another centimeter but as opposed to in the past this does not probe to bone. The patient has been seen at [place] by Dr. Zenia Resides. He is not planning to do any more revascularization unless the wound stalls or worsens per the patient 10/02/16; 0.7 x 0.8 x 0.8. Unfortunately although the wound looks stable to improved. There is now easily probable bone. This hasn't been present for several weeks. Patient is not otherwise symptomatic he is not experiencing any pain. I did a culture of the wound bed 10/09/16. Deterioration last week. Culture grew MRSA and although there is improvement here with doxycycline prescribed over the phone I'm going to try to get him linezolid 600 twice  a day for 10 days today. 10/16/16; he is completing a weeks worth of linezolid and still has 3 more days to go. Small triangular-shaped open area with some degree of undermining. There is still palpable bone with a curet. Overall the area appears better than last week 10/20/16 he has completed the linezolid still having some nausea and vomiting but no diarrhea. He has exposed bone this week which is a deterioration. 10/27/16 patient now has a small but probing wound down to bone. Culture of this bone that I did last week showed a few methicillin-resistant staph aureus. I have little doubt that this represents acute/subacute osteomyelitis. The patient is currently on Doxy which I will continue he also completed 10 days of linezolid. We are now in a difficult situation with this patient's foot after considerable discussion we will send him back to see Dr. Doran Durand for a surgical opinion of this I'm also going to try to arrange a infectious disease consult at [facility] hopefully week and get this prior to her usual 4-6 weeks we having [place]. The patient clearly is going to need 6 weeks of IV vancomycin. If we cannot arrange this expediently I'll have to consider ordering this myself through a home infusion company 11/03/16; the patient now has a small in terms of circumference but probing wound. No bone palpable today. The  patient remains on doxycycline 100 twice a day which should support him until he sees infectious disease at [facility] next week the following week on Wednesday I believe he has an appointment with Dr. Andree Elk at [facility] who is his vascular cardiologist. Finally he has an appointment with Dr. Doran Durand on 11/22/16 we have been using silver alginate. The patient's wife states they are having trouble getting this through [facility] 11/13/16; the patient was seen by infectious disease at [facility] in the 11th PICC line placed in preparation for IV antibiotics. A tummy he has not going to get IV  vancomycin o Ceftaroline. They've also ordered an MRI. Patient has a follow-up with Dr. Doran Durand on 11/22/16 and Dr. Andree Elk at [facility] tomorrow 11/23/16 the patient is on daptomycin as directed by infectious disease at [facility]. He is also been back to see Dr. Andree Elk at [facility]. He underwent a repeat arteriogram. He had a successful PTA of the right anterior tibial artery. He is on dual antiplatelete treatment with Plavix and aspirin. Finally he had the MRI of his foot in [place]. This showed cellulitis about the foot worse distally edema and enhancement in the reminiscent of the second metatarsal was consistent with osteomyelitis therefore what I was assuming to be the first metatarsal may be actually the second. He also has a fluid collection deep to the calcaneus at the level of the calcaneal spur which could be an abscess or due to adventitial bursitis. He had a small tear in his Achilles 11/30/16; the patient continues on daptomycin as directed by infectious disease at [facility]. He is been revascularized by Dr. Andree Elk at [facility] in [place]. He has been to see Gretta Arab who was the orthopedic surgeon who did his original amputation. I have not seen his not however per the patient's wife he did not offer another surgical local surgical prodecure to remove involved bone. He verbalized his usual disbelief in not just hyperbarics but any medical therapy for this condition(osteomyelitis). I discussed this in detail with the patient today including answering the question about a BKA definitively "curing" the current condition. 12/07/16; the patient continues on daptomycin as directed by infectious disease at [facility]. This is directed at the MRSA that we cultured from his bone debridement from 12/22. Lab work today shows a white count of 8.7 hemoglobin of 10.5 which is microcytic and hypochromic differential count shows a slightly elevated monocyte count at 1.2 eosinophilic count of 0.6. His  creatinine is 1.11 sedimentation rate apparently is gone from 35-34 now 40. I explained was wife I don't think this represents a trend. His total CK is 48 12/14/16- patient is here for follow-up evaluation of his right TMA site. He continues to receive IV daptomycin per infectious disease. His serum inflammatory markers remain elevated. He complains of intermittent pain to the medial aspect of the TMA site with intermittent erythema. He voices no complaints or concerns regarding hyperbaric therapy. Overall he and his wife are expressing a frustration and discouragement regarrding the length of time of treatment. 12/21/16; small open wound at roughly the first or second metatarsal metatarsalphalyngeal joint reminiscence of his transmetatarsal amputation site he continues to receive IV daptomycin per infectious disease at [facility]. He will finish these a week tomorrow. He has lab work which I been copied on. His white count is 10.8 hemoglobin 8.9 MCV is low at 75., MCH low at 24.5 platelet count slightly elevated at 626. Differential count shows 70% neutrophils 10% monocytes and 10% eosinophils. His  comprehensive metabolic panel shows a slightly low sodium at 133 albumin low at 3.1 total CK is normal at 42 sedimentation rate is much higher at 82. He has had iron studies that show a serum iron of 15 and iron binding capacity of 238 and iron saturation of 6. This is suggestive of iron deficiency. B12 and folate were normal ferritin at 113 The patient tells me that he is not eating well and he has lost weight. He feels episodically nauseated. He is coughing and gagging on mucus which she thinks is sinusitis. He has an appointment with his primary doctor at 5:00 this afternoon in [facility] 01/02/17; the patient developed a subacute pneumonitis. He was admitted to [facility] after a CT scan showed an extensive interstitial pneumonitis [I have not yet seen this]. 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 He has since been discharged. He is not on oxygen. He feels fatigued and very short of breath with exertion. At [facility] the wound care nurse there felt that his wound was healed. He did complete his daptomycin and has follow-up with infectious disease on Friday. X-rays I did before he went to [facility] still suggested residual osteomyelitis in the anterior aspect of the second must metatarsal head. I'm not sure I would've expected any different. There was no other findings. I actually think I did this because of erythema over the first metatarsal head reminiscent 01/11/17; the patient has eosinophilic pneumonitis. He has been reviewed by pulmonology and given clearance for hyperbarics at least that's what his wife says. I'll need to see if there is note in care everywhere. Apparently the prognosis for improvement of daptomycin induced eosinophilic granulocyte is is 3 months without steroids. In the meantime infectious disease has placed him on doxycycline until the wound is closed. He is still is lost a lot of weight and his blood sugars are running in the mid 60s to low 80s fasting and at all times during the day 01/18/17; he has had adjustments in his insulin apparently his blood sugars in the morning or over 100. He wants to restart his hyperbaric treatment we'll do this at 1:00. 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 I think there is good reason to complete his treatments in order to give him the best chance of maintaining a healed status and these DFU 3 wounds with MRSA infection in the bone 02/15/17; the patient was seen today in conjunction with HBO. He completed hyperbaric oxygen today. The open area on his transmetatarsal site has remained closed. There was an area of erythema when I saw him  earlier in the week on the posterior heel although that is resolved as of today as well. He has been using a cam walker. This is a patient who came to [place] after a transmetatarsal amputation that was necrotic and dehisced. He required revascularization percutaneously on 2 different occasions by Dr. Andree Elk of invasive cardiology at [facility]. 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 He went to [facility] infectious disease and had IV daptomycin for 5 weeks before developing eosinophilic pneumonitis and requiring an admission to hospital/ICU. He made a good recovery and is continued on doxycycline since. As mentioned his foot is closed now. He has a follow-up with Dr. Andree Elk tomorrow. He is going  to [employer] on Monday for a custom-made shoe READMISSION Last visit Dr. Andree Elk [facility] 02/16/17 1. Critical limb ischemia of the RLE: s/p transmetatarsal amputation of the RLE, now completely healed. s/p right ATA percutaneous revascularization procedure x2, most recently 11/20/2016 s/p PTA of right AT 100% to less than 20% with a 2.5 x 200 balloon. He does have some residual osteomyelitis, but given the wound is closed, the orthopedist has recommended to follow. He has a fitting for a special shoe coming up next week. He has completed a course of daptomycin and doxycycline and he has been discharged by ID. He has completed a course of hyperbaric therapy. Continue Continue medical management with aspirin, Plavix and statin therapy. We will discuss ongoing Plavix therapy at follow-up in 6 months. 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 Given no symptoms, we will conservatively manage. He doesn't want to do any more invasive studies at this time, which is reasonable. The patient  arrives today out of 2 concerns both on the right transmetatarsal site. 1 at the level of the reminiscent fifth metatarsal head and the other at roughly the first or second. Both of these look like dark subcutaneous discoloration probably subdermal bleeding. He is recently obtained new adaptive footwear for the right foot. He also has a callus on the left fifth dorsal toe however he follows with podiatry for this and I don't think this is any issue. ABIs in this clinic today were 0.66 on the right and 1.06 on the left. On entrance into our clinic initially this was 0.95 and 0.92. He does not describe current claudication. They're going away on a cruise in 8 weeks and I think are trying to do the month is much as they can proactively. They follow with podiatry and have an appointment with Dr. Andree Elk in October READMISSION 06/25/18 This is a patient that we have not seen in almost a year. He is a type II diabetic with known PAD. He is followed by Dr. Andree Elk of interventional cardiology at [facility] in [place]. He is required revascularization for significant PAD. When we first saw him he required a transmetatarsal amputation. He had underlying osteomyelitis with a nonhealing surgical wound. This eventually closed with wound care, IV antibiotics and hyperbaric oxygen. They tell me that he has a modified shoe and he is very active walking up to 4 miles a day. He is followed by Dr. Geroge Baseman of podiatry. His wife states that he underwent a removal of callus over this site on July 9. She felt there may be drainage from this site after that although she could never really determined and there was callus buildup again. On 06/01/18 there was pressure bleeding through the overlying callus. he was given a prescription for 7 days of Bactrim. Fortuitously he has an appointment with Dr. Andree Elk on 06/04/18 and he immediately underwent revascularization of the right leg although I have not had a chance to review these  records in care everywhere. This was apparently done through anterior and retrograde access. On 06/06/18 he had another debridement by Dr. Bernette Mayers. Vitamin soaking with Epsom salts for 20 minutes and applying calcium alginate. The original trans-met was on April 2017 I believe by Dr. Doran Durand. His ABI in our clinic was noncompressible today. 07/02/18; x-ray I ordered last week was negative for osteomyelitis. Swab culture was also negative. He is going to  require an MRI which I have ordered today. 07/09/18; surprisingly the MRI of the foot that I ordered did not show osteomyelitis. He did suggest the possibility of cellulitis. For this reason I'll go ahead and give him a 10 day course of doxycycline. Although the previous culture of this area was negative 07/16/18; it arrives with the wound looking much the same. Roughly the same depth. He has thick subcutaneous tissue around the wound orifice but this still has roughly the same depth. Been using silver alginate. We applied Oasis #1 today 07/23/2018; still having to remove a lot of callus and thick subcutaneous tissue to actually define the wound here. Most of this seems to have closed down yet he has a comma shaped divot over the top of the area that still I think is open. We applied Oasis #2 His wife expressed concern about the tip of his left great toe. This almost looks like a small blister. She also showed it today to her podiatrist Dr. Geroge Baseman who did not think this was anything serious. I am not sure is anything serious either however I think it bears some watching. He is not in any pain however he is insensate 07/30/2018;; still on a lot of nonviable tissue over the surface of the wound however cleaning this up reveals a more substantial wound orifice but less of a probing wound depth. This is not probed to bone. There is no evidence of infection The wife is still concerned about a non-open area on the tip of his left great toe. Almost feels like a  bony outgrowth. She had previously showed this to podiatry. I do not think this is a blister. A friction area would be possible although he is really not walking according to his wife. He had a small skin tag on his right buttock but no open wound here either 08/06/2018; we applied a third Oasis last week. Unfortunately there is really no improvement. Still requiring extensive debridement to expose the wound bed from a horizontal slitlike depression. I still have not been able to get this to fill in properly. On the positive side there is now no probable bone from when he first came into the facility. I changed him to silver alginate today after a reasonably aggressive debridement 08/13/2018; once again the patient comes in with skin and subcutaneous tissue closing over the small probing area with the underlying cavity of the wound on the right TMA site. I applied silver alginate to this last week. Prior to that we used Oasis x3 still not able to get this area granulating. He does not have a probing area of the bone which is an improvement from when I spur started working on this and an MRI did not suggest osteomyelitis. I do not see evidence of infection here but I am increasingly concerned about why I cannot get this area to granulate. Each time I debrided this is looks like this is simply a matter of getting granulation to fill in the hole and then getting epithelialization. This does not seem to happen Also I sent him back to see podiatry Dr. Earleen Newport about the what felt to be bony outgrowth on the tip of his left great toe. Apparently after heel he left the clinic last week or the next day he developed a blood blister. He did see Dr. Earleen Newport. He went on to have a debridement of the medial nail cuticle he now has an open area here as well as some denuded skin. They  did an x-ray apparently does have a bony outgrowth or spur but I am not able to look at this. They are using topical antibiotics  apparently there was some suggested he use Santyl. Patient's wife was anxious for my opinion of this 08/20/2018; we are able to keep the wound open this time instead of the thick subcutaneous tissue closing over the top of it however unfortunately once again this probes to bone. I do not see any evidence of infection and previous MRI did not show osteomyelitis. I elected to go back to the Oasis to see if we can stimulate some granulation. Last saw his vascular interventional cardiologist Dr. Andree Elk at the beginning of August and he had a repeat procedure. Nevertheless I wonder how much blood flow he has down to this area With regards to the left first toe he is seeing Dr. Earleen Newport next week. They are applying Bactroban to this area. 08/27/2018 Once again he comes in with thick eschar and subcutaneous tissue over the top of the small probing hole. This does not appear to go down to bone but it still has roughly the same depth. I reapplied Oasis today Over the left great toe there appears to be more of the wound at the tip of his toe than there was last week. This has an eschar on the surface of it. Will change to Santyl. There is seeing podiatry this afternoon 09/03/2018 He comes in today with the area on the transmetatarsal's site with a fair amount of callus, nonviable tissue over the circumference but it was not closed. I removed all of this as well as some subcutaneous debris and reapplied Oasis. There is no exposed bone The area over the tip of the left great toe started off as a nodule of uncertain etiology. He has been followed with podiatry. They have been removing part of the medial nail bed. He has nonviable tissue over the wound he been using Santyl in this area 09/10/18 Unfortunately comes in with neither wound area looking improved. The transmetatarsal amputation site once Again has nonviable debris over the surface requiring debridement. Unfortunately underneath this there is  nothing that looks viable and this once again goes right down to bone. There is no purulent drainage and no erythema. Also the surgical wound from podiatry on the left first toe has an ischemic-looking eschar over the surface of the tip of the toe I have elected not to attempt candidly debride this His wife as arranged for him to have follow-up noninvasive studies in [place] under the care of Dr. Andree Elk clinic. The question is he has known severe PAD. They had recently seen him in August. He has had revascularizations in both legs within the last 4 or 5 months. He is not complaining of pain and I cannot really get a history of claudication. He has not systemically unwell 09/20/2018 the patient has been to [facility] and been revascularized by Dr. Andree Elk earlier this week. Apparently he was able to open up the anterior tibial artery although I have not actually seen his formal report. He is going for an attempt to revascularize on the left on Monday. He is apparently working with an investigational stent for lower extremity arteries below the knee and he has talked to the patient about placing that on Monday if possible. The patient has been using silver alginate on the transmetatarsal amputation site and Santyl on the left 09/30/2018; patient had his revascularization on the left this apparently included a standard approach  as well as a more distal arterial catheterization although I do not have any information on this from Dr. Andree Elk. In fact I do not even see the initial revascularization that he had on the right. I have included the arterial history from Dr. Andree Elk last note however below; ASSESSMENT/PLAN: 1. Hx of Critical limb ischemia bilateral lower extremities, PAD: -s/p transmetatarsal amputation of the RLE. -s/p right ATA percutaneous revascularization procedure x2, most recently 11/20/2016 s/p PTA of right AT 100% to less than 20% with a 2.5 x 200 balloon. -On original angiogram  05/15/2016, he had a significant 70-95% left popliteal artery stenosis with AT and PT artery occlusions and one vessel runoff via the peroneal artery. -03/21/2018 s/p PTA of 90% left popliteal artery to <10% with a 5x20 cutting balloon, PTA of 90% left peroneal to <20% with a 3x20 balloon, PTA of 100% left AT to <20% with a 2.5x220 balloon. 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 Will plan on the LLE soon after. Risks/benefits of procedure have been discussed and patient has elected to proceed. We have been using endoform to the right TMA amputation site wound and Santyl to the left great toe 10/07/2018; the area on the tip of his left great toe looked better we have been using Santyl here. We continue to have a very difficult probing hole on the right TMA amputation site we have been using endoform. I went on to use his fifth Oasis today 10/14/2018; the tip of the left great toe continues to look better. We have been using Santyl here the surface however is healthy and I think we can change to an alginate. The right TMA has not changed. Once again he has no superficial opening there is callus and thick subcutaneous tissue over the orifice once you remove this there is the probing area that we have been dealing with without too much change. There is no palpable bone I have been placing Oasis here and put Oasis #6 in this today after a more vigorous debridement 10/21/18; the left great toe still has necrotic surface requiring debridement. The right TMA site hasn't changed in view of the thick callus over the wound bed. With removal of this there is still the opening however this does not appear to have the same depth. Again there is no palpable bone. Oasis was replaced 10/28/2018; patient comes in with both wounds looking worse. The area over the  first toe tip is now down to bone. The area over the TMA site is deeper and down to bone clearly with a increase in overall wound area. Equally concerning on the right TMA is the complete absence of a pulse this week which is a change. We are not even able to Doppler this. On the right he has a noncompressible ABI greater than 1.4 11/04/2018. Both wounds look somewhat worse. X-rays showed no osteomyelitis of the right foot but on the left there was underlying osteomyelitis in the left great toe distal phalanx. I been on the phone to Dr. Andree Elk surface at [facility] in [place] and they are arranging for another angiogram on the right on Monday. I have him on doxycycline for the osteomyelitis in the left great toe for now. Infectious disease may be necessary 1/17; 2-week hiatus. Patient was admitted to hospital at [place]. My understanding is he underwent an angioplasty of  the right anterior tibial artery and had stents placed in the left anterior artery and the left tibial peroneal trunk. This was done by Dr. Andree Elk of interventional radiology. There is no major change in either 1 of the wounds. They have been using Aquacel Ag. As far as they are aware no imaging studies were done of the foot which is indeed unfortunate. I had him on doxycycline for 2 weeks since we identified the osteomyelitis in the left great toe by plain x-ray. I have renewed that again today. I am still suspicious about osteomyelitis in the amputation site and would consider doing another MRI to compare with the one done in September. The idea of hyperbaric oxygen certainly comes up for discussion 1/24; no major change in either wound area. I have him on doxycycline for osteomyelitis at the tip of the left great toe. As noted he has been previously and recently revascularized by Dr. Andree Elk at [place]. He is tolerating the doxycycline well. For some reason we do not have an infectious disease consult yet. Culture of drainage from  the right foot site last week was negative 1/31; MRI of the right foot did not show osteomyelitis of the right ankle and foot. Notable for a skin ulceration overlying the second metatarsal stump with generalizing soft tissue edema of the ankle and foot consistent with cellulitis. Noted to have a partial-thickness tear of the Achilles tendon 7.5 cm proximal to the insertion Nothing really new in terms of symptoms. Patient's area on the tip of the left great toe is just about closed although he still has the probing area on the metatarsal amputation site. Appointment with Dr. Linus Salmons of infectious disease next week 2/7; Dr. Novella Olive did not feel that any further antibiotics were necessary he would follow-up in 2 months. The left great toe appears to be closed still some surface callus that I gently looked under high did not see anything open or anything that was threatening to be open. He still has the open area on the mid part of his TMA site using endoform 2/14; left great toe is closed and he is completing his doxycycline as of last Sunday. He is not on any antibiotics. Unfortunately out of the right foot his wife noticed some subdermal hemorrhage this week. He had been walking 2 miles I had given him permission to do so this is not really a plantar wound. Using endoform to this wound but I changed to silver alginate this week 2/21; left great toe remains closed. Culture last week grew Streptococcus angiosis which I am not really familiar with however it is penicillin sensitive and I am going to put him on Augmentin. Not much change in the wound on the right foot the deep area is still probing precariously close to bone and the wound on the margin of the TMA is larger. 2/28; left great toe remains closed. He is completing the Augmentin I gave him last week. 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 This is being shown to Dr. Andree Elk at [place] to see if there  is anything else that can be done here. He has been using silver alginate strips on the right 3/6; left great toe remains closed. He sees Dr. Andree Elk on Monday. The area on the plantar aspect of the right foot has the same small orifice with thick callused tissue around this. However this time with removal of the callus tissue the wound is open all the way along the  incision line to the end medially. We have been using silver alginate. 3/13; left toe remains closed although the area is callused. He sees Dr. Jacqualyn Posey of podiatry next week. The area on the plantar right foot looked a lot better this week. Culture I did of this was negative we use silver alginate. He is going next week for an attempt at revascularization by Dr. Andree Elk 3/23; left toe remains closed although the area is callused. He will see Dr. Jacqualyn Posey in follow-up. The area on the right plantar foot continues to look surprisingly better over the last 3 visits. We have been using silver alginate. The revascularization he was supposed to have by Dr. Andree Elk at [facility] in [place] has been canceled [facility] procedure] 4/6; the right foot remains closed albeit callused. Podiatry canceled the appointment with regards to the left great toe. He has not seen Dr. Andree Elk at [facility] but thinks that Dr. Andree Elk has "done all he can do". He would be a candidate for the hemostaemix trial Readmission 07/29/2019 Mr. [name] is a man we know well from at least 3 previous stays in this clinic. He is a type II diabetic with severe PAD followed by Dr. Andree Elk at [facility] in [place]. 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 So far everything in both of these areas has remained closed. About 2 weeks ago he went to see his podiatrist [facility] Dr. Babs Bertin. He had a thick callus on the left fifth metatarsal head that was  shaved. He has developed an open wound in this area. They have been offloading this in his diabetic shoes. The patient has not had any more revascularizations by Dr. Andree Elk since the last time he was here. ABI in our clinic at the posterior tibial on the left was 1.06 10/6; no real change in the area on the plantar met head. Small wound with 2 mm of depth. He has thick skin probably from pressure around the wound. We have been using silver alginate Electronic Signature(s) Signed: 08/05/2019 5:29:39 PM By: [name] MD Entered By: [name] on 08/05/2019 13:53:04 -------------------------------------------------------------------------------- Physical Exam Details Patient Name: Date of Service: [name], [name] 08/05/2019 1:00 PM Medical Record [id number] Patient Account Number: [id number] Date of Birth/Sex: Treating RN: June 30, 1946 (72 y.o. [name] Primary Care Provider: [name] Other Clinician: Referring Provider: Treating Provider/Extender:[name], [name], [name] in Treatment: 1 Constitutional Sitting or standing Blood Pressure is within target range for patient.. Pulse regular and within target range for patient.Marland Kitchen Respirations regular, non-labored and within target range.. Temperature is normal and within the target range for the patient.Marland Kitchen Appears in no distress. Eyes Conjunctivae clear. No discharge.no icterus. Respiratory work of breathing is normal. Cardiovascular Both the dorsalis pedis and posterior tibial pulses are palpable. Integumentary (Hair, Skin) There is no erythema around the wound. Psychiatric appears at normal baseline. Notes Wound exam; no real improvement. Small area 2 mm in depth. I removed the undermining area last week there is no undermining today. There is no evidence of surrounding infection. Electronic Signature(s) Signed: 08/05/2019 5:29:39 PM By: [name] MD Entered By: [name] on 08/05/2019  13:54:16 -------------------------------------------------------------------------------- Physician Orders Details Patient Name: Date of Service: TREVER, STREATER 08/05/2019 1:00 PM Medical Record [id number] Patient Account Number: [id number] Date of Birth/Sex: Treating RN: June 05, 1946 (72 y.o. [name]) [name] Primary  Care Provider: [name] Other Clinician: Referring Provider: Treating Provider/Extender:[name], [name], [name] in Treatment: 1 Verbal / Phone Orders: No Diagnosis Coding ICD-10 Coding Code Description E11.621 Type 2 diabetes mellitus with foot ulcer E11.51 Type 2 diabetes mellitus with diabetic peripheral angiopathy without gangrene L97.521 Non-pressure chronic ulcer of other part of left foot limited to breakdown of skin Follow-up Appointments Return Appointment in 1 week. Dressing Change Frequency Change dressing every day. Wound Cleansing Wound #5 Left Metatarsal head fifth Clean wound with Wound Cleanser Primary Wound Dressing Wound #5 Left Metatarsal head fifth Calcium Alginate with Silver Secondary Dressing Wound #5 Left Metatarsal head fifth Kerlix/Rolled Gauze - secure with tape Dry Gauze Other: - felt to cushion shoe Off-Loading Other: - surgical shoe with felt Electronic Signature(s) Signed: 08/05/2019 5:29:39 PM By: [name] MD Signed: 08/05/2019 6:04:54 PM By: [name] RN Entered By: [name] on 08/05/2019 13:45:25 -------------------------------------------------------------------------------- Problem List Details Patient Name: Date of Service: BRAXSTON, QUINTER 08/05/2019 1:00 PM Medical Record [id number] Patient Account Number: [id number] Date of Birth/Sex: Treating RN: 07-03-1946 (72 y.o. [name] Primary Care Provider: [name] Other Clinician: Referring Provider: Treating Provider/Extender:[name], [name], [name] in Treatment: 1 Active Problems ICD-10 Evaluated  Encounter Code Description Active Date Today Diagnosis E11.621 Type 2 diabetes mellitus with foot ulcer 07/29/2019 No Yes E11.51 Type 2 diabetes mellitus with diabetic peripheral 07/29/2019 No Yes angiopathy without gangrene L97.521 Non-pressure chronic ulcer of other part of left foot 07/29/2019 No Yes limited to breakdown of skin Inactive Problems Resolved Problems Electronic Signature(s) Signed: 08/05/2019 5:29:39 PM By: [name] MD Entered By: [name] on 08/05/2019 13:52:04 -------------------------------------------------------------------------------- Progress Note Details Patient Name: Date of Service: [name], [name] 08/05/2019 1:00 PM Medical Record [id number] Patient Account Number: [id number] Date of Birth/Sex: Treating RN: Dec 19, 1945 (72 y.o. [name] Primary Care Provider: [name] Other Clinician: Referring Provider: Treating Provider/Extender:[name], [name], [name] in Treatment: 1 Subjective History of Present Illness (HPI) 05/18/16; this is a 73year-old diabetic who is a type II diabetic on insulin. The history is that he traumatized his right foot developed a sore sometime in late March. Shortly thereafter he went on a cruise but he had to get off the cruise ship in [place] and fly urgently back to [place] where he was admitted to [facility] and ultimately underwent a transmetatarsal amputation by Dr. 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 According to the patient and his wife this wound never really healed. He was seen on 2 occasions in the wound care center in [place] and had vascular studies and then was referred urgently to Dr. Bridgett Larsson of vascular surgery. He underwent an angiogram on 05/10/16. Unfortunately nothing really could be done to improve his vascular status. He had a 75-90% stenosis in the midsegment of 1 segment of the posterior femoral artery. He had a patent popliteal, his anterior tibial occluded  shortly after takeoff. Perineal had a greater than 90% stenosis posterior tibial is occluded feet had no distal collaterals feed distal aspect of the transmetatarsal amputation site. The patient tells me that he had a prolonged period of Santyl by Dr. Doran Durand was some initial improvement but then this was stopped. I think they're only applying daily dressings/dry dressings. He has not had a recent x-ray of the right foot he did have one before his surgery in April. His wife by the dimensions of the wound/surgical site being followed at home since 4/20. At that point the dimensions were 0.5 x  12 x 0.2 on 7/19 this was 1.8 x 6 x 0.4. He is not currently on any antibiotics. His hemoglobin A1c in early April was 12.9 at that point he was started on insulin. Apparently his blood sugars are much lower he has an appointment with Dr. Legrand Como Alteimer of endocrine next week. 05/29/16 x-ray of the area did not show osteomyelitis. I think he probably needs an MRI at this point. His wife is asking about something called"Yireh" cream which is not FDA approved. I have not heard of this. 06/22/16; MRI did not really suggest osteomyelitis. There was minimal marrow edema and enhancement in the stump of the second metatarsal felt to be secondary likely to postoperative change rather than osteomyelitis. The patient has arranged his own consultation with Dr. Andree Elk at [place], apparently their daughter lives in [place] and has some connection here. Any improvement in vascular supply by Dr. Andree Elk would of course be helpful. Dr. Bridgett Larsson did not feel that anything further could be done other than amputation if wound care did not result in healing or if the area deteriorates. 06/26/16; the patient has been to see Dr. Andree Elk at [place] and had an angiogram. He is going for a procedure on Thursday which will involve catheterization. I'm not sure if this is an anterograde or retrograde approach. He has been using Santyl to the  wound 07/10/16; the patient had a repeat angiogram and angioplasty at [place] by Dr. Brunetta Jeans. His angiogram showed right CFA and profundal widely patent. The right as of a.m. popliteal artery were widely patent the right anterior tibial was occluded proximally and reconstitutes at the ankle. Peroneal artery was patent to the foot. Posterior tibial artery was occluded. The patient had angioplasty of the anterior tibial artery.. This was quite successful. He was recommended for Plavix as well as aspirin. 07/17/16; the patient was close to be a nurse visit today however the outer dressing of the Apligraf fell off. Noted drainage. I was asked to see the wound. The patient is noted an odor however his wife had noted that. Drainage with Apligraf not necessarily a bad thing. He has not been systemically unwell 07/24/16; we are still have an issue with drainage of this wound. In spite of this I applied his second Apligraf. Medially the area still is probing to bone. 08/07/16; Apligraf reapplied in general wound looks improved. 08/21/16 Apligraf #4. Wound looks much better 09/04/16 patientt's wound again today continues to appear to improve with the application of the Apligraf's. He notes no increased discomfort or concerns at this point in time. 09/18/16; the patient returns today 2 weeks after his fifth application of Apligraf. Predictably three quarters of the width of this wound has healed. The deep area that probe to bone medially is still open. The patient asked how much out-of-pocket dollars would be for additional Apligraf's. 09/25/16; now using Hydrofera Blue. He has completed 5 Apligraf applications with considerable improvement in this deep open transmetatarsal amputation site. His wound is now a triangular-shaped wound on the medial aspect. At roughly 12 to 2:00 this probes another centimeter but as opposed to in the past this does not probe to bone. The patient has been seen at [place] by Dr. Zenia Resides.  He is not planning to do any more revascularization unless the wound stalls or worsens per the patient 10/02/16; 0.7 x 0.8 x 0.8. Unfortunately although the wound looks stable to improved. There is now easily probable bone. This hasn't been present for several weeks. Patient is  not otherwise symptomatic he is not experiencing any pain. I did a culture of the wound bed 10/09/16. Deterioration last week. Culture grew MRSA and although there is improvement here with doxycycline prescribed over the phone I'm going to try to get him linezolid 600 twice a day for 10 days today. 10/16/16; he is completing a weeks worth of linezolid and still has 3 more days to go. Small triangular-shaped open area with some degree of undermining. There is still palpable bone with a curet. Overall the area appears better than last week 10/20/16 he has completed the linezolid still having some nausea and vomiting but no diarrhea. He has exposed bone this week which is a deterioration. 10/27/16 patient now has a small but probing wound down to bone. Culture of this bone that I did last week showed a few methicillin-resistant staph aureus. I have little doubt that this represents acute/subacute osteomyelitis. The patient is currently on Doxy which I will continue he also completed 10 days of linezolid. We are now in a difficult situation with this patient's foot after considerable discussion we will send him back to see Dr. Doran Durand for a surgical opinion of this I'm also going to try to arrange a infectious disease consult at [facility] hopefully week and get this prior to her usual 4-6 weeks we having Amelia. The patient clearly is going to need 6 weeks of IV vancomycin. If we cannot arrange this expediently I'll have to consider ordering this myself through a home infusion company 11/03/16; the patient now has a small in terms of circumference but probing wound. No bone palpable today. The patient remains on doxycycline 100  twice a day which should support him until he sees infectious disease at [facility] next week the following week on Wednesday I believe he has an appointment with Dr. Andree Elk at [facility] who is his vascular cardiologist. Finally he has an appointment with Dr. Doran Durand on 11/22/16 we have been using silver alginate. The patient's wife states they are having trouble getting this through [facility] 11/13/16; the patient was seen by infectious disease at [facility] in the 11th PICC line placed in preparation for IV antibiotics. A tummy he has not going to get IV vancomycin o Ceftaroline. They've also ordered an MRI. Patient has a follow-up with Dr. Doran Durand on 11/22/16 and Dr. Andree Elk at [facility] tomorrow 11/23/16 the patient is on daptomycin as directed by infectious disease at [facility]. He is also been back to see Dr. Andree Elk at [facility]. He underwent a repeat arteriogram. He had a successful PTA of the right anterior tibial artery. He is on dual antiplatelete treatment with Plavix and aspirin. Finally he had the MRI of his foot in [place]. This showed cellulitis about the foot worse distally edema and enhancement in the reminiscent of the second metatarsal was consistent with osteomyelitis therefore what I was assuming to be the first metatarsal may be actually the second. He also has a fluid collection deep to the calcaneus at the level of the calcaneal spur which could be an abscess or due to adventitial bursitis. He had a small tear in his Achilles 11/30/16; the patient continues on daptomycin as directed by infectious disease at [facility]. He is been revascularized by Dr. Andree Elk at [facility] in [place]. He has been to see Gretta Arab who was the orthopedic surgeon who did his original amputation. I have not seen his not however per the patient's wife he did not offer another surgical local surgical prodecure to remove  involved bone. He verbalized his usual disbelief in not just hyperbarics but any  medical therapy for this condition(osteomyelitis). I discussed this in detail with the patient today including answering the question about a BKA definitively "curing" the current condition. 12/07/16; the patient continues on daptomycin as directed by infectious disease at [facility]. This is directed at the MRSA that we cultured from his bone debridement from 12/22. Lab work today shows a white count of 8.7 hemoglobin of 10.5 which is microcytic and hypochromic differential count shows a slightly elevated monocyte count at 1.2 eosinophilic count of 0.6. His creatinine is 1.11 sedimentation rate apparently is gone from 35-34 now 40. I explained was wife I don't think this represents a trend. His total CK is 48 12/14/16- patient is here for follow-up evaluation of his right TMA site. He continues to receive IV daptomycin per infectious disease. His serum inflammatory markers remain elevated. He complains of intermittent pain to the medial aspect of the TMA site with intermittent erythema. He voices no complaints or concerns regarding hyperbaric therapy. Overall he and his wife are expressing a frustration and discouragement regarrding the length of time of treatment. 12/21/16; small open wound at roughly the first or second metatarsal metatarsalphalyngeal joint reminiscence of his transmetatarsal amputation site he continues to receive IV daptomycin per infectious disease at [facility]. He will finish these a week tomorrow. He has lab work which I been copied on. His white count is 10.8 hemoglobin 8.9 MCV is low at 75., MCH low at 24.5 platelet count slightly elevated at 626. Differential count shows 70% neutrophils 10% monocytes and 10% eosinophils. His comprehensive metabolic panel shows a slightly low sodium at 133 albumin low at 3.1 total CK is normal at 42 sedimentation rate is much higher at 82. He has had iron studies that show a serum iron of 15 and iron binding capacity of 238 and iron saturation of  6. This is suggestive of iron deficiency. B12 and folate were normal ferritin at 113 The patient tells me that he is not eating well and he has lost weight. He feels episodically nauseated. He is coughing and gagging on mucus which she thinks is sinusitis. He has an appointment with his primary doctor at 5:00 this afternoon in [facility] 01/02/17; the patient developed a subacute pneumonitis. He was admitted to [facility] after a CT scan showed an extensive interstitial pneumonitis [I have not yet seen this]. 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 He has since been discharged. He is not on oxygen. He feels fatigued and very short of breath with exertion. At [facility] the wound care nurse there felt that his wound was healed. He did complete his daptomycin and has follow-up with infectious disease on Friday. X-rays I did before he went to [facility] still suggested residual osteomyelitis in the anterior aspect of the second must metatarsal head. I'm not sure I would've expected any different. There was no other findings. I actually think I did this because of erythema over the first metatarsal head reminiscent 01/11/17; the patient has eosinophilic pneumonitis. He has been reviewed by pulmonology and given clearance for hyperbarics at least that's what his wife says. I'll need to see if there is note in care everywhere. Apparently the prognosis for improvement of daptomycin induced eosinophilic granulocyte is is 3 months without steroids. In the meantime infectious disease has placed him on doxycycline until the wound is closed. He  is still is lost a lot of weight and his blood sugars are running in the mid 60s to low 80s fasting and at all times during the day 01/18/17; he has had adjustments in his insulin apparently his blood sugars in the morning or over 100. He wants to restart his  hyperbaric treatment we'll do this at 1:00. 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 I think there is good reason to complete his treatments in order to give him the best chance of maintaining a healed status and these DFU 3 wounds with MRSA infection in the bone 02/15/17; the patient was seen today in conjunction with HBO. He completed hyperbaric oxygen today. The open area on his transmetatarsal site has remained closed. There was an area of erythema when I saw him earlier in the week on the posterior heel although that is resolved as of today as well. He has been using a cam walker. This is a patient who came to [place] after a transmetatarsal amputation that was necrotic and dehisced. He required revascularization percutaneously on 2 different occasions by Dr. Andree Elk of invasive cardiology at [facility]. 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 He went to [facility] infectious disease and had IV daptomycin for 5 weeks before developing eosinophilic pneumonitis and requiring an admission to hospital/ICU. He made a good recovery and is continued on doxycycline since. As mentioned his foot is closed now. He has a follow-up with Dr. Andree Elk tomorrow. He is going to [employer] on Monday for a custom-made shoe READMISSION Last visit Dr. Andree Elk [facility] 02/16/17 1. Critical limb ischemia of the RLE:  s/p transmetatarsal amputation of the RLE, now completely healed.  s/p right ATA percutaneous revascularization procedure x2, most recently 11/20/2016 s/p PTA of right AT 100% to less than 20% with a 2.5 x 200 balloon.  He does have some residual osteomyelitis, but given the wound is closed, the orthopedist has recommended to follow. He has a fitting for a special shoe coming up next week. He has completed a course of  daptomycin and doxycycline and he has been discharged by ID. He has completed a course of hyperbaric therapy.  Continue Continue medical management with aspirin, Plavix and statin therapy. We will discuss ongoing Plavix therapy at follow-up in 6 months.  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 Given no symptoms, we will conservatively manage. He doesn't want to do any more invasive studies at this time, which is reasonable. The patient arrives today out of 2 concerns both on the right transmetatarsal site. 1 at the level of the reminiscent fifth metatarsal head and the other at roughly the first or second. Both of these look like dark subcutaneous discoloration probably subdermal bleeding. He is recently obtained new adaptive footwear for the right foot. He also has a callus on the left fifth dorsal toe however he follows with podiatry for this and I don't think this is any issue. ABIs in this clinic today were 0.66 on the right and 1.06 on the left. On entrance into our clinic initially this was 0.95 and 0.92. He does not describe current claudication. They're going away on a cruise in 8 weeks and I think are trying to do the month is much as  they can proactively. They follow with podiatry and have an appointment with Dr. Andree Elk in October READMISSION 06/25/18 This is a patient that we have not seen in almost a year. He is a type II diabetic with known PAD. He is followed by Dr. Andree Elk of interventional cardiology at [facility] in [place]. He is required revascularization for significant PAD. When we first saw him he required a transmetatarsal amputation. He had underlying osteomyelitis with a nonhealing surgical wound. This eventually closed with wound care, IV antibiotics and hyperbaric oxygen. They tell me that he has a modified shoe and he is very active  walking up to 4 miles a day. He is followed by Dr. Geroge Baseman of podiatry. His wife states that he underwent a removal of callus over this site on July 9. She felt there may be drainage from this site after that although she could never really determined and there was callus buildup again. On 06/01/18 there was pressure bleeding through the overlying callus. he was given a prescription for 7 days of Bactrim. Fortuitously he has an appointment with Dr. Andree Elk on 06/04/18 and he immediately underwent revascularization of the right leg although I have not had a chance to review these records in care everywhere. This was apparently done through anterior and retrograde access. On 06/06/18 he had another debridement by Dr. Bernette Mayers. Vitamin soaking with Epsom salts for 20 minutes and applying calcium alginate. The original trans-met was on April 2017 I believe by Dr. Doran Durand. His ABI in our clinic was noncompressible today. 07/02/18; x-ray I ordered last week was negative for osteomyelitis. Swab culture was also negative. He is going to require an MRI which I have ordered today. 07/09/18; surprisingly the MRI of the foot that I ordered did not show osteomyelitis. He did suggest the possibility of cellulitis. For this reason I'll go ahead and give him a 10 day course of doxycycline. Although the previous culture of this area was negative 07/16/18; it arrives with the wound looking much the same. Roughly the same depth. He has thick subcutaneous tissue around the wound orifice but this still has roughly the same depth. Been using silver alginate. We applied Oasis #1 today 07/23/2018; still having to remove a lot of callus and thick subcutaneous tissue to actually define the wound here. Most of this seems to have closed down yet he has a comma shaped divot over the top of the area that still I think is open. We applied Oasis #2 His wife expressed concern about the tip of his left great toe. This almost looks like a small  blister. She also showed it today to her podiatrist Dr. Geroge Baseman who did not think this was anything serious. I am not sure is anything serious either however I think it bears some watching. He is not in any pain however he is insensate 07/30/2018;; still on a lot of nonviable tissue over the surface of the wound however cleaning this up reveals a more substantial wound orifice but less of a probing wound depth. This is not probed to bone. There is no evidence of infection The wife is still concerned about a non-open area on the tip of his left great toe. Almost feels like a bony outgrowth. She had previously showed this to podiatry. I do not think this is a blister. A friction area would be possible although he is really not walking according to his wife. He had a small skin tag on his right buttock but no open wound here  either 08/06/2018; we applied a third Oasis last week. Unfortunately there is really no improvement. Still requiring extensive debridement to expose the wound bed from a horizontal slitlike depression. I still have not been able to get this to fill in properly. On the positive side there is now no probable bone from when he first came into the facility. I changed him to silver alginate today after a reasonably aggressive debridement 08/13/2018; once again the patient comes in with skin and subcutaneous tissue closing over the small probing area with the underlying cavity of the wound on the right TMA site. I applied silver alginate to this last week. Prior to that we used Oasis x3 still not able to get this area granulating. He does not have a probing area of the bone which is an improvement from when I spur started working on this and an MRI did not suggest osteomyelitis. I do not see evidence of infection here but I am increasingly concerned about why I cannot get this area to granulate. Each time I debrided this is looks like this is simply a matter of getting granulation to  fill in the hole and then getting epithelialization. This does not seem to happen Also I sent him back to see podiatry Dr. Earleen Newport about the what felt to be bony outgrowth on the tip of his left great toe. Apparently after heel he left the clinic last week or the next day he developed a blood blister. He did see Dr. Earleen Newport. He went on to have a debridement of the medial nail cuticle he now has an open area here as well as some denuded skin. They did an x-ray apparently does have a bony outgrowth or spur but I am not able to look at this. They are using topical antibiotics apparently there was some suggested he use Santyl. Patient's wife was anxious for my opinion of this 08/20/2018; we are able to keep the wound open this time instead of the thick subcutaneous tissue closing over the top of it however unfortunately once again this probes to bone. I do not see any evidence of infection and previous MRI did not show osteomyelitis. I elected to go back to the Oasis to see if we can stimulate some granulation. Last saw his vascular interventional cardiologist Dr. Andree Elk at the beginning of August and he had a repeat procedure. Nevertheless I wonder how much blood flow he has down to this area With regards to the left first toe he is seeing Dr. Earleen Newport next week. They are applying Bactroban to this area. 08/27/2018 ooOnce again he comes in with thick eschar and subcutaneous tissue over the top of the small probing hole. This does not appear to go down to bone but it still has roughly the same depth. I reapplied Oasis today ooOver the left great toe there appears to be more of the wound at the tip of his toe than there was last week. This has an eschar on the surface of it. Will change to Santyl. There is seeing podiatry this afternoon 09/03/2018 ooHe comes in today with the area on the transmetatarsal's site with a fair amount of callus, nonviable tissue over the circumference but it was not closed. I  removed all of this as well as some subcutaneous debris and reapplied Oasis. There is no exposed bone ooThe area over the tip of the left great toe started off as a nodule of uncertain etiology. He has been followed with podiatry. They have been removing  part of the medial nail bed. He has nonviable tissue over the wound he been using Santyl in this area 09/10/18 ooUnfortunately comes in with neither wound area looking improved. The transmetatarsal amputation site once Again has nonviable debris over the surface requiring debridement. Unfortunately underneath this there is nothing that looks viable and this once again goes right down to bone. There is no purulent drainage and no erythema. ooAlso the surgical wound from podiatry on the left first toe has an ischemic-looking eschar over the surface of the tip of the toe I have elected not to attempt candidly debride this ooHis wife as arranged for him to have follow-up noninvasive studies in [place] under the care of Dr. Andree Elk clinic. The question is he has known severe PAD. They had recently seen him in August. He has had revascularizations in both legs within the last 4 or 5 months. He is not complaining of pain and I cannot really get a history of claudication. He has not systemically unwell 09/20/2018 the patient has been to [facility] and been revascularized by Dr. Andree Elk earlier this week. Apparently he was able to open up the anterior tibial artery although I have not actually seen his formal report. He is going for an attempt to revascularize on the left on Monday. He is apparently working with an investigational stent for lower extremity arteries below the knee and he has talked to the patient about placing that on Monday if possible. The patient has been using silver alginate on the transmetatarsal amputation site and Santyl on the left 09/30/2018; patient had his revascularization on the left this apparently included a standard  approach as well as a more distal arterial catheterization although I do not have any information on this from Dr. Andree Elk. In fact I do not even see the initial revascularization that he had on the right. I have included the arterial history from Dr. Andree Elk last note however below; ASSESSMENT/PLAN: 1. Hx of Critical limb ischemia bilateral lower extremities, PAD: -s/p transmetatarsal amputation of the RLE. -s/p right ATA percutaneous revascularization procedure x2, most recently 11/20/2016 s/p PTA of right AT 100% to less than 20% with a 2.5 x 200 balloon. 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 -03/21/2018 s/p PTA of 90% left popliteal artery to <10% with a 5x20 cutting balloon, PTA of 90% left peroneal to <20% with a 3x20 balloon, PTA of 100% left AT to <20% with a 2.5x220 balloon. 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 Will plan on the LLE soon after. Risks/benefits of procedure have been discussed and patient has elected to proceed. We have been using endoform to the right TMA amputation site wound and Santyl to the left great toe 10/07/2018; the area on the tip of his left great toe looked better we have been using Santyl here. We continue to have a very difficult probing hole on the right TMA amputation site we have been using endoform. I went on to use his fifth Oasis today 10/14/2018; the tip of the left great toe continues to look better. We have been using Santyl here the surface however is healthy and I think we can change to an alginate. The right TMA has not changed. Once again he has no  superficial opening there is callus and thick subcutaneous tissue over the orifice once you remove this there is  the probing area that we have been dealing with without too much change. There is no palpable bone I have been placing Oasis here and put Oasis #6 in this today after a more vigorous debridement 10/21/18; the left great toe still has necrotic surface requiring debridement. The right TMA site hasn't changed in view of the thick callus over the wound bed. With removal of this there is still the opening however this does not appear to have the same depth. Again there is no palpable bone. Oasis was replaced 10/28/2018; patient comes in with both wounds looking worse. The area over the first toe tip is now down to bone. The area over the TMA site is deeper and down to bone clearly with a increase in overall wound area. Equally concerning on the right TMA is the complete absence of a pulse this week which is a change. We are not even able to Doppler this. On the right he has a noncompressible ABI greater than 1.4 11/04/2018. Both wounds look somewhat worse. X-rays showed no osteomyelitis of the right foot but on the left there was underlying osteomyelitis in the left great toe distal phalanx. I been on the phone to Dr. Andree Elk surface at [facility] in [place] and they are arranging for another angiogram on the right on Monday. I have him on doxycycline for the osteomyelitis in the left great toe for now. Infectious disease may be necessary 1/17; 2-week hiatus. Patient was admitted to hospital at [place]. My understanding is he underwent an angioplasty of the right anterior tibial artery and had stents placed in the left anterior artery and the left tibial peroneal trunk. This was done by Dr. Andree Elk of interventional radiology. There is no major change in either 1 of the wounds. They have been using Aquacel Ag. As far as they are aware no imaging studies were done of the foot which is indeed unfortunate. I had him on doxycycline for 2 weeks since we identified the osteomyelitis in the left great toe by plain  x-ray. I have renewed that again today. I am still suspicious about osteomyelitis in the amputation site and would consider doing another MRI to compare with the one done in September. The idea of hyperbaric oxygen certainly comes up for discussion 1/24; no major change in either wound area. I have him on doxycycline for osteomyelitis at the tip of the left great toe. As noted he has been previously and recently revascularized by Dr. Andree Elk at [place]. He is tolerating the doxycycline well. For some reason we do not have an infectious disease consult yet. Culture of drainage from the right foot site last week was negative 1/31; MRI of the right foot did not show osteomyelitis of the right ankle and foot. Notable for a skin ulceration overlying the second metatarsal stump with generalizing soft tissue edema of the ankle and foot consistent with cellulitis. Noted to have a partial-thickness tear of the Achilles tendon 7.5 cm proximal to the insertion Nothing really new in terms of symptoms. Patient's area on the tip of the left great toe is just about closed although he still has the probing area on the metatarsal amputation site. Appointment with Dr. Linus Salmons of infectious disease next week 2/7; Dr. Novella Olive did not feel that any further antibiotics were necessary he would follow-up in 2 months. The left great toe appears to be closed still some  surface callus that I gently looked under high did not see anything open or anything that was threatening to be open. He still has the open area on the mid part of his TMA site using endoform 2/14; left great toe is closed and he is completing his doxycycline as of last Sunday. He is not on any antibiotics. Unfortunately out of the right foot his wife noticed some subdermal hemorrhage this week. He had been walking 2 miles I had given him permission to do so this is not really a plantar wound. Using endoform to this wound but I changed to silver alginate this  week 2/21; left great toe remains closed. Culture last week grew Streptococcus angiosis which I am not really familiar with however it is penicillin sensitive and I am going to put him on Augmentin. Not much change in the wound on the right foot the deep area is still probing precariously close to bone and the wound on the margin of the TMA is larger. 2/28; left great toe remains closed. He is completing the Augmentin I gave him last week. 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 This is being shown to Dr. Andree Elk at [place] to see if there is anything else that can be done here. He has been using silver alginate strips on the right 3/6; left great toe remains closed. He sees Dr. Andree Elk on Monday. The area on the plantar aspect of the right foot has the same small orifice with thick callused tissue around this. However this time with removal of the callus tissue the wound is open all the way along the incision line to the end medially. We have been using silver alginate. 3/13; left toe remains closed although the area is callused. He sees Dr. Jacqualyn Posey of podiatry next week. The area on the plantar right foot looked a lot better this week. Culture I did of this was negative we use silver alginate. He is going next week for an attempt at revascularization by Dr. Andree Elk 3/23; left toe remains closed although the area is callused. He will see Dr. Jacqualyn Posey in follow-up. The area on the right plantar foot continues to look surprisingly better over the last 3 visits. We have been using silver alginate. The revascularization he was supposed to have by Dr. Andree Elk at [facility] in [place] has been canceled [facility] procedure] 4/6; the right foot remains closed albeit callused. Podiatry canceled the appointment with regards to the left great toe. He has not seen Dr. Andree Elk at [facility] but thinks that Dr. Andree Elk has "done all he can do". He would be a candidate for the  hemostaemix trial Readmission 07/29/2019 Mr. [name] is a man we know well from at least 3 previous stays in this clinic. He is a type II diabetic with severe PAD followed by Dr. Andree Elk at [facility] in [place]. 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 So far everything in both of these areas has remained closed. About 2 weeks ago he went to see his podiatrist [facility] Dr. Babs Bertin. He had a thick callus on the left fifth metatarsal head that was shaved. He has developed an open wound in this area. They have been offloading this in his diabetic shoes. The patient has not had any more revascularizations by Dr. Andree Elk since the  last time he was here. ABI in our clinic at the posterior tibial on the left was 1.06 10/6; no real change in the area on the plantar met head. Small wound with 2 mm of depth. He has thick skin probably from pressure around the wound. We have been using silver alginate Objective Constitutional Sitting or standing Blood Pressure is within target range for patient.. Pulse regular and within target range for patient.Marland Kitchen Respirations regular, non-labored and within target range.. Temperature is normal and within the target range for the patient.Marland Kitchen Appears in no distress. Vitals Time Taken: 12:58 PM, Temperature: 98.4 F, Pulse: 75 bpm, Respiratory Rate: 18 breaths/min, Blood Pressure: 109/71 mmHg. Eyes Conjunctivae clear. No discharge.no icterus. Respiratory work of breathing is normal. Cardiovascular Both the dorsalis pedis and posterior tibial pulses are palpable. Psychiatric appears at normal baseline. General Notes: Wound exam; no real improvement. Small area 2 mm in depth. I removed the undermining area last week there is no undermining today. There is no evidence of surrounding infection. Integumentary (Hair, Skin) There is no erythema  around the wound. Wound #5 status is Open. Original cause of wound was Gradually Appeared. The wound is located on the Left Metatarsal head fifth. The wound measures 0.2cm length x 0.2cm width x 0.2cm depth; 0.031cm^2 area and 0.006cm^3 volume. There is Fat Layer (Subcutaneous Tissue) Exposed exposed. There is no tunneling or undermining noted. There is a small amount of serous drainage noted. The wound margin is thickened. There is large (67-100%) pink, pale granulation within the wound bed. There is no necrotic tissue within the wound bed. 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 Dressing Change Frequency: Change dressing every day. Wound Cleansing: Wound #5 Left Metatarsal head fifth: Clean wound with Wound Cleanser Primary Wound Dressing: Wound #5 Left Metatarsal head fifth: Calcium Alginate with Silver Secondary Dressing: Wound #5 Left Metatarsal head fifth: Kerlix/Rolled Gauze - secure with tape Dry Gauze Other: - felt to cushion shoe Off-Loading: Other: - surgical shoe with felt 1. I am continue with the silver alginate with the surgical shoe and felt offloading 2. I talked today about a Darco forefoot off loader and/or a total contact cast for additional offloading. 3. May need to debride the edges of this next week if we do not get any progression towards closure 4. I see no evidence of infection no need for additional studies including x-rays 5. Although he has known severe PAD with stents on the left his peripheral pulses are palpable I do not think this is a blood flow issue at this point Electronic Signature(s) Signed: 08/05/2019 5:29:39 PM By: [name] MD Entered By: [name] on 08/05/2019  13:57:16 -------------------------------------------------------------------------------- SuperBill Details Patient Name: Date of Service: VIRGINIO, ISIDORE 08/05/2019 Medical Record [id number] Patient Account Number: [id number] Date of Birth/Sex: Treating RN: 09/16/1946 (72 y.o. [name] Primary Care Provider: [name] Other Clinician: Referring Provider: Treating Provider/Extender:[name], [name], [name] in Treatment: 1 Diagnosis Coding ICD-10 Codes Code Description E11.621 Type 2 diabetes mellitus with foot ulcer E11.51 Type 2 diabetes mellitus with diabetic peripheral angiopathy without gangrene L97.521 Non-pressure chronic ulcer of other part of left foot limited to breakdown of skin Facility Procedures CPT4 Code: [id number] Description: 99213 - WOUND CARE VISIT-LEV 3 EST PT Modifier: Quantity: 1 Physician Procedures CPT4  Code Description: 9147829 99213 - WC PHYS LEVEL 3 - EST PT ICD-10 Diagnosis Description E11.621 Type 2 diabetes mellitus with foot ulcer L97.521 Non-pressure chronic ulcer of other part of left foot limite Modifier: d to breakdown Quantity: 1 of skin Electronic Signature(s) Signed: 08/05/2019 5:29:39 PM By: [name] MD Entered By: [name] on 08/05/2019 13:57:33

## 2019-08-12 ENCOUNTER — Other Ambulatory Visit (HOSPITAL_COMMUNITY)
Admission: RE | Admit: 2019-08-12 | Discharge: 2019-08-12 | Disposition: A | Discharge status: Home / Self Care | Payer: Medicare Other | Source: Other Acute Inpatient Hospital | Attending: Internal Medicine | Admitting: Internal Medicine

## 2019-08-12 ENCOUNTER — Encounter (HOSPITAL_BASED_OUTPATIENT_CLINIC_OR_DEPARTMENT_OTHER): Payer: Medicare Other | Admitting: Internal Medicine

## 2019-08-12 ENCOUNTER — Other Ambulatory Visit: Payer: Self-pay

## 2019-08-12 DIAGNOSIS — B957 Other staphylococcus as the cause of diseases classified elsewhere: Secondary | ICD-10-CM | POA: Diagnosis not present

## 2019-08-12 DIAGNOSIS — X58XXXA Exposure to other specified factors, initial encounter: Secondary | ICD-10-CM | POA: Insufficient documentation

## 2019-08-12 DIAGNOSIS — S90922A Unspecified superficial injury of left foot, initial encounter: Secondary | ICD-10-CM | POA: Diagnosis present

## 2019-08-12 DIAGNOSIS — E11621 Type 2 diabetes mellitus with foot ulcer: Secondary | ICD-10-CM | POA: Diagnosis not present

## 2019-08-15 LAB — AEROBIC CULTURE W GRAM STAIN (SUPERFICIAL SPECIMEN): Gram Stain: NONE SEEN

## 2019-08-15 LAB — AEROBIC CULTURE? (SUPERFICIAL SPECIMEN)

## 2019-08-18 ENCOUNTER — Other Ambulatory Visit: Payer: Self-pay

## 2019-08-18 ENCOUNTER — Encounter (HOSPITAL_BASED_OUTPATIENT_CLINIC_OR_DEPARTMENT_OTHER): Payer: Medicare Other | Admitting: Internal Medicine

## 2019-08-18 DIAGNOSIS — E11621 Type 2 diabetes mellitus with foot ulcer: Secondary | ICD-10-CM | POA: Diagnosis not present

## 2019-08-20 NOTE — Progress Notes (Signed)
MATHIEU, SCHLOEMER ([id number]) Visit Report for 08/18/2019 Debridement Details Patient Name: Date of Service: [name], [name] 08/18/2019 11:30 AM Medical Record [id number] Patient Account Number: [id number] Date of Birth/Sex: 1946/01/13 (73 y.o. M) Treating RN: Levan Hurst Primary Care Provider: [name] Other Clinician: Referring Provider: Treating Provider/Extender:Atianna Haidar, [name], [name] in Treatment: 2 Debridement Performed for Wound #5 Left Metatarsal head fifth Assessment: Performed By: Physician Ricard Dillon., MD Debridement Type: Debridement Severity of Tissue Pre Fat layer exposed Debridement: Level of Consciousness (Pre- Awake and Alert procedure): Pre-procedure Verification/Time Out Taken: Yes - 12:42 Start Time: 12:42 Total Area Debrided (L x W): 1 (cm) x 1.5 (cm) = 1.5 (cm) Tissue and other material Viable, Non-Viable, Callus, Subcutaneous debrided: Level: Skin/Subcutaneous Tissue Debridement Description: Excisional Instrument: Curette Bleeding: Minimum Hemostasis Achieved: Pressure End Time: 12:43 Procedural Pain: 0 Post Procedural Pain: 0 Response to Treatment: Procedure was tolerated well Level of Consciousness Awake and Alert (Post-procedure): Post Debridement Measurements of Total Wound Length: (cm) 1 Width: (cm) 1.5 Depth: (cm) 0.1 Volume: (cm) 0.118 Character of Wound/Ulcer Post Improved Debridement: Severity of Tissue Post Debridement: Fat layer exposed Post Procedure Diagnosis Same as Pre-procedure Electronic Signature(s) Signed: 08/18/2019 5:54:03 PM By: [name] MD Signed: 08/20/2019 6:50:57 PM By: Levan Hurst RN, BSN Entered By: [name] on 08/18/2019 13:15:58 -------------------------------------------------------------------------------- HPI Details Patient Name: Date of Service: [name], [name] 08/18/2019 11:30 AM Medical Record [id number] Patient Account Number: [id number] Date of  Birth/Sex: [birth year] (73 y.o. M) Treating RN: Levan Hurst Primary Care Provider: [name] Other Clinician: Referring Provider: Treating Provider/Extender:Shivam Mestas, [name], [name] in Treatment: 2 History of Present Illness HPI Description: 05/18/16; this is a 73year-old diabetic who is a type II diabetic on insulin. The history is that he traumatized his right foot developed a sore sometime in late March. 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 According to the patient and his wife this wound never really healed. He was seen on 2 occasions in the wound care center in [place] and had vascular studies and then was referred urgently to Dr. Bridgett Larsson of vascular surgery. He underwent an angiogram on 05/10/16. Unfortunately nothing really could be done to improve his vascular status. He had a 75-90% stenosis in the midsegment of 1 segment of the posterior femoral artery. He had a patent popliteal, his anterior tibial occluded shortly after takeoff. Perineal had a greater than 90% stenosis posterior tibial is occluded feet had no distal collaterals feed distal aspect of the transmetatarsal amputation site. The patient tells me that he had a prolonged period of Santyl by Dr. Doran Durand was some initial improvement but then this was stopped. I think they're only applying daily dressings/dry dressings. He has not had a recent x-ray of the right foot he did have one before his surgery in April. His wife by the dimensions of the wound/surgical site being followed at home since 4/20. At that point the dimensions were 0.5 x 12 x 0.2 on 7/19 this was 1.8 x 6 x 0.4. He is not currently on any antibiotics. His hemoglobin A1c in early April was 12.9 at that point he was  started on insulin. Apparently his blood sugars are much lower he has an appointment with Dr. Legrand Como Alteimer of endocrine next week. 05/29/16 x-ray of  the area did not show osteomyelitis. I think he probably needs an MRI at this point. His wife is asking about something called"Yireh" cream which is not FDA approved. I have not heard of this. 06/22/16; MRI did not really suggest osteomyelitis. There was minimal marrow edema and enhancement in the stump of the second metatarsal felt to be secondary likely to postoperative change rather than osteomyelitis. The patient has arranged his own consultation with Dr. Andree Elk at [facility], apparently their daughter lives in [place] and has some connection here. Any improvement in vascular supply by Dr. Andree Elk would of course be helpful. Dr. Bridgett Larsson did not feel that anything further could be done other than amputation if wound care did not result in healing or if the area deteriorates. 06/26/16; the patient has been to see Dr. Andree Elk at [place] and had an angiogram. He is going for a procedure on Thursday which will involve catheterization. I'm not sure if this is an anterograde or retrograde approach. He has been using Santyl to the wound 07/10/16; the patient had a repeat angiogram and angioplasty at [facility] by Dr. Brunetta Jeans. His angiogram showed right CFA and profundal widely patent. The right as of a.m. popliteal artery were widely patent the right anterior tibial was occluded proximally and reconstitutes at the ankle. Peroneal artery was patent to the foot. Posterior tibial artery was occluded. The patient had angioplasty of the anterior tibial artery.. This was quite successful. He was recommended for Plavix as well as aspirin. 07/17/16; the patient was close to be a nurse visit today however the outer dressing of the Apligraf fell off. Noted drainage. I was asked to see the wound. The patient is noted an odor however his wife had noted that. Drainage with Apligraf not  necessarily a bad thing. He has not been systemically unwell 07/24/16; we are still have an issue with drainage of this wound. In spite of this I applied his second Apligraf. Medially the area still is probing to bone. 08/07/16; Apligraf reapplied in general wound looks improved. 08/21/16 Apligraf #4. Wound looks much better 09/04/16 patientt's wound again today continues to appear to improve with the application of the Apligraf's. He notes no increased discomfort or concerns at this point in time. 09/18/16; the patient returns today 2 weeks after his fifth application of Apligraf. Predictably three quarters of the width of this wound has healed. The deep area that probe to bone medially is still open. The patient asked how much out-of-pocket dollars would be for additional Apligraf's. 09/25/16; now using Hydrofera Blue. He has completed 5 Apligraf applications with considerable improvement in this deep open transmetatarsal amputation site. His wound is now a triangular-shaped wound on the medial aspect. At roughly 12 to 2:00 this probes another centimeter but as opposed to in the past this does not probe to bone. The patient has been seen at [facility] by Dr. Zenia Resides. He is not planning to do any more revascularization unless the wound stalls or worsens per the patient 10/02/16; 0.7 x 0.8 x 0.8. Unfortunately although the wound looks stable to improved. There is now easily probable bone. This hasn't been present for several weeks. Patient is not otherwise symptomatic he is not experiencing any pain. I did a culture of the wound bed 10/09/16. Deterioration last week. Culture grew MRSA and although there is improvement here with doxycycline prescribed over the phone I'm going to try to get him linezolid 600 twice a day for 10 days today. 10/16/16; he is completing a weeks  worth of linezolid and still has 3 more days to go. Small triangular-shaped open area with some degree of undermining. There is still  palpable bone with a curet. Overall the area appears better than last week 10/20/16 he has completed the linezolid still having some nausea and vomiting but no diarrhea. He has exposed bone this week which is a deterioration. 10/27/16 patient now has a small but probing wound down to bone. Culture of this bone that I did last week showed a few methicillin-resistant staph aureus. I have little doubt that this represents acute/subacute osteomyelitis. The patient is currently on Doxy which I will continue he also completed 10 days of linezolid. We are now in a difficult situation with this patient's foot after considerable discussion we will send him back to see Dr. Doran Durand for a surgical opinion of this I'm also going to try to arrange a infectious disease consult at [facility] hopefully week and get this prior to her usual 4-6 weeks we having [place]. The patient clearly is going to need 6 weeks of IV vancomycin. If we cannot arrange this expediently I'll have to consider ordering this myself through a home infusion company 11/03/16; the patient now has a small in terms of circumference but probing wound. No bone palpable today. The patient remains on doxycycline 100 twice a day which should support him until he sees infectious disease at [facility] next week the following week on Wednesday I believe he has an appointment with Dr. Andree Elk at [facility] who is his vascular cardiologist. Finally he has an appointment with Dr. Doran Durand on 11/22/16 we have been using silver alginate. The patient's wife states they are having trouble getting this through [facility] 11/13/16; the patient was seen by infectious disease at [facility] in the 11th PICC line placed in preparation for IV antibiotics. A tummy he has not going to get IV vancomycin o Ceftaroline. They've also ordered an MRI. Patient has a follow-up with Dr. Doran Durand on 11/22/16 and Dr. Andree Elk at [facility] tomorrow 11/23/16 the patient is on daptomycin as directed by  infectious disease at [facility]. He is also been back to see Dr. Andree Elk at [facility]. He underwent a repeat arteriogram. He had a successful PTA of the right anterior tibial artery. He is on dual antiplatelete treatment with Plavix and aspirin. Finally he had the MRI of his foot in [place]. This showed cellulitis about the foot worse distally edema and enhancement in the reminiscent of the second metatarsal was consistent with osteomyelitis therefore what I was assuming to be the first metatarsal may be actually the second. He also has a fluid collection deep to the calcaneus at the level of the calcaneal spur which could be an abscess or due to adventitial bursitis. He had a small tear in his Achilles 11/30/16; the patient continues on daptomycin as directed by infectious disease at [facility]. He is been revascularized by Dr. Andree Elk at [facility] in [place]. He has been to see Gretta Arab who was the orthopedic surgeon who did his original amputation. I have not seen his not however per the patient's wife he did not offer another surgical local surgical prodecure to remove involved bone. He verbalized his usual disbelief in not just hyperbarics but any medical therapy for this condition(osteomyelitis). I discussed this in detail with the patient today including answering the question about a BKA definitively "curing" the current condition. 12/07/16; the patient continues on daptomycin as directed by infectious disease at [facility]. This is directed at the  MRSA that we cultured from his bone debridement from 12/22. Lab work today shows a white count of 8.7 hemoglobin of 10.5 which is microcytic and hypochromic differential count shows a slightly elevated monocyte count at 1.2 eosinophilic count of 0.6. His creatinine is 1.11 sedimentation rate apparently is gone from 35-34 now 40. I explained was wife I don't think this represents a trend. His total CK is 48 12/14/16- patient is here for follow-up  evaluation of his right TMA site. He continues to receive IV daptomycin per infectious disease. His serum inflammatory markers remain elevated. He complains of intermittent pain to the medial aspect of the TMA site with intermittent erythema. He voices no complaints or concerns regarding hyperbaric therapy. Overall he and his wife are expressing a frustration and discouragement regarrding the length of time of treatment. 12/21/16; small open wound at roughly the first or second metatarsal metatarsalphalyngeal joint reminiscence of his transmetatarsal amputation site he continues to receive IV daptomycin per infectious disease at [facility]. He will finish these a week tomorrow. He has lab work which I been copied on. His white count is 10.8 hemoglobin 8.9 MCV is low at 75., MCH low at 24.5 platelet count slightly elevated at 626. Differential count shows 70% neutrophils 10% monocytes and 10% eosinophils. His comprehensive metabolic panel shows a slightly low sodium at 133 albumin low at 3.1 total CK is normal at 42 sedimentation rate is much higher at 82. He has had iron studies that show a serum iron of 15 and iron binding capacity of 238 and iron saturation of 6. This is suggestive of iron deficiency. B12 and folate were normal ferritin at 113 The patient tells me that he is not eating well and he has lost weight. He feels episodically nauseated. He is coughing and gagging on mucus which she thinks is sinusitis. He has an appointment with his primary doctor at 5:00 this afternoon in [facility] 01/02/17; the patient developed a subacute pneumonitis. He was admitted to [facility] after a CT scan showed an extensive interstitial pneumonitis [I have not yet seen this]. 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 He has since been discharged. He is not on oxygen. He feels fatigued and very short of  breath with exertion. At [facility] the wound care nurse there felt that his wound was healed. He did complete his daptomycin and has follow-up with infectious disease on Friday. X-rays I did before he went to [facility] still suggested residual osteomyelitis in the anterior aspect of the second must metatarsal head. I'm not sure I would've expected any different. There was no other findings. I actually think I did this because of erythema over the first metatarsal head reminiscent 01/11/17; the patient has eosinophilic pneumonitis. He has been reviewed by pulmonology and given clearance for hyperbarics at least that's what his wife says. I'll need to see if there is note in care everywhere. Apparently the prognosis for improvement of daptomycin induced eosinophilic granulocyte is is 3 months without steroids. In the meantime infectious disease has placed him on doxycycline until the wound is closed. He is still is lost a lot of weight and his blood sugars are running in the mid 60s to low 80s fasting and at all times during the day 01/18/17; he has had adjustments in his insulin apparently his blood sugars in the morning or over 100. He wants to restart his hyperbaric treatment we'll do this  at 1:00. 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 I think there is good reason to complete his treatments in order to give him the best chance of maintaining a healed status and these DFU 3 wounds with MRSA infection in the bone 02/15/17; the patient was seen today in conjunction with HBO. He completed hyperbaric oxygen today. The open area on his transmetatarsal site has remained closed. There was an area of erythema when I saw him earlier in the week on the posterior heel although that is resolved as of today as well. He has been using a cam walker. This is a patient who came to [place] after a transmetatarsal amputation that was  necrotic and dehisced. He required revascularization percutaneously on 2 different occasions by Dr. Andree Elk of invasive cardiology at [facility]. 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 He went to [facility] infectious disease and had IV daptomycin for 5 weeks before developing eosinophilic pneumonitis and requiring an admission to hospital/ICU. He made a good recovery and is continued on doxycycline since. As mentioned his foot is closed now. He has a follow-up with Dr. Andree Elk tomorrow. He is going to [employer] on Monday for a custom-made shoe READMISSION Last visit Dr. Andree Elk [facility] 02/16/17 1. Critical limb ischemia of the RLE: s/p transmetatarsal amputation of the RLE, now completely healed. s/p right ATA percutaneous revascularization procedure x2, most recently 11/20/2016 s/p PTA of right AT 100% to less than 20% with a 2.5 x 200 balloon. He does have some residual osteomyelitis, but given the wound is closed, the orthopedist has recommended to follow. He has a fitting for a special shoe coming up next week. He has completed a course of daptomycin and doxycycline and he has been discharged by ID. He has completed a course of hyperbaric therapy. Continue Continue medical management with aspirin, Plavix and statin therapy. We will discuss ongoing Plavix therapy at follow-up in 6 months. 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 Given no symptoms, we will conservatively manage. He doesn't want to do any more invasive studies at this time, which is reasonable. The patient arrives today out of 2 concerns both on the right transmetatarsal site. 1 at the level of the reminiscent fifth metatarsal head and the other at roughly the first or second. Both of these look like dark  subcutaneous discoloration probably subdermal bleeding. He is recently obtained new adaptive footwear for the right foot. He also has a callus on the left fifth dorsal toe however he follows with podiatry for this and I don't think this is any issue. ABIs in this clinic today were 0.66 on the right and 1.06 on the left. On entrance into our clinic initially this was 0.95 and 0.92. He does not describe current claudication. They're going away on a cruise in 8 weeks and I think are trying to do the month is much as they can proactively. They follow with podiatry and have an appointment with Dr. Andree Elk in October READMISSION 06/25/18 This is a patient that we have not seen in almost a year. He is a type II diabetic with known PAD. He is followed by Dr. Andree Elk of interventional cardiology at [facility] in [place]. He is required revascularization for significant PAD. When  we first saw him he required a transmetatarsal amputation. He had underlying osteomyelitis with a nonhealing surgical wound. This eventually closed with wound care, IV antibiotics and hyperbaric oxygen. They tell me that he has a modified shoe and he is very active walking up to 4 miles a day. He is followed by Dr. Geroge Baseman of podiatry. His wife states that he underwent a removal of callus over this site on July 9. She felt there may be drainage from this site after that although she could never really determined and there was callus buildup again. On 06/01/18 there was pressure bleeding through the overlying callus. he was given a prescription for 7 days of Bactrim. Fortuitously he has an appointment with Dr. Andree Elk on 06/04/18 and he immediately underwent revascularization of the right leg although I have not had a chance to review these records in care everywhere. This was apparently done through anterior and retrograde access. On 06/06/18 he had another debridement by Dr. Bernette Mayers. Vitamin soaking with Epsom salts for 20 minutes and  applying calcium alginate. The original trans-met was on April 2017 I believe by Dr. Doran Durand. His ABI in our clinic was noncompressible today. 07/02/18; x-ray I ordered last week was negative for osteomyelitis. Swab culture was also negative. He is going to require an MRI which I have ordered today. 07/09/18; surprisingly the MRI of the foot that I ordered did not show osteomyelitis. He did suggest the possibility of cellulitis. For this reason I'll go ahead and give him a 10 day course of doxycycline. Although the previous culture of this area was negative 07/16/18; it arrives with the wound looking much the same. Roughly the same depth. He has thick subcutaneous tissue around the wound orifice but this still has roughly the same depth. Been using silver alginate. We applied Oasis #1 today 07/23/2018; still having to remove a lot of callus and thick subcutaneous tissue to actually define the wound here. Most of this seems to have closed down yet he has a comma shaped divot over the top of the area that still I think is open. We applied Oasis #2 His wife expressed concern about the tip of his left great toe. This almost looks like a small blister. She also showed it today to her podiatrist Dr. Geroge Baseman who did not think this was anything serious. I am not sure is anything serious either however I think it bears some watching. He is not in any pain however he is insensate 07/30/2018;; still on a lot of nonviable tissue over the surface of the wound however cleaning this up reveals a more substantial wound orifice but less of a probing wound depth. This is not probed to bone. There is no evidence of infection The wife is still concerned about a non-open area on the tip of his left great toe. Almost feels like a bony outgrowth. She had previously showed this to podiatry. I do not think this is a blister. A friction area would be possible although he is really not walking according to his wife. He had a  small skin tag on his right buttock but no open wound here either 08/06/2018; we applied a third Oasis last week. Unfortunately there is really no improvement. Still requiring extensive debridement to expose the wound bed from a horizontal slitlike depression. I still have not been able to get this to fill in properly. On the positive side there is now no probable bone from when he first came into the facility. I changed  him to silver alginate today after a reasonably aggressive debridement 08/13/2018; once again the patient comes in with skin and subcutaneous tissue closing over the small probing area with the underlying cavity of the wound on the right TMA site. I applied silver alginate to this last week. Prior to that we used Oasis x3 still not able to get this area granulating. He does not have a probing area of the bone which is an improvement from when I spur started working on this and an MRI did not suggest osteomyelitis. I do not see evidence of infection here but I am increasingly concerned about why I cannot get this area to granulate. Each time I debrided this is looks like this is simply a matter of getting granulation to fill in the hole and then getting epithelialization. This does not seem to happen Also I sent him back to see podiatry Dr. Earleen Newport about the what felt to be bony outgrowth on the tip of his left great toe. Apparently after heel he left the clinic last week or the next day he developed a blood blister. He did see Dr. Earleen Newport. He went on to have a debridement of the medial nail cuticle he now has an open area here as well as some denuded skin. They did an x-ray apparently does have a bony outgrowth or spur but I am not able to look at this. They are using topical antibiotics apparently there was some suggested he use Santyl. Patient's wife was anxious for my opinion of this 08/20/2018; we are able to keep the wound open this time instead of the thick subcutaneous tissue  closing over the top of it however unfortunately once again this probes to bone. I do not see any evidence of infection and previous MRI did not show osteomyelitis. I elected to go back to the Oasis to see if we can stimulate some granulation. Last saw his vascular interventional cardiologist Dr. Andree Elk at the beginning of August and he had a repeat procedure. Nevertheless I wonder how much blood flow he has down to this area With regards to the left first toe he is seeing Dr. Earleen Newport next week. They are applying Bactroban to this area. 08/27/2018 Once again he comes in with thick eschar and subcutaneous tissue over the top of the small probing hole. This does not appear to go down to bone but it still has roughly the same depth. I reapplied Oasis today Over the left great toe there appears to be more of the wound at the tip of his toe than there was last week. This has an eschar on the surface of it. Will change to Santyl. There is seeing podiatry this afternoon 09/03/2018 He comes in today with the area on the transmetatarsal's site with a fair amount of callus, nonviable tissue over the circumference but it was not closed. I removed all of this as well as some subcutaneous debris and reapplied Oasis. There is no exposed bone The area over the tip of the left great toe started off as a nodule of uncertain etiology. He has been followed with podiatry. They have been removing part of the medial nail bed. He has nonviable tissue over the wound he been using Santyl in this area 09/10/18 Unfortunately comes in with neither wound area looking improved. The transmetatarsal amputation site once Again has nonviable debris over the surface requiring debridement. Unfortunately underneath this there is nothing that looks viable and this once again goes right down to bone. There  is no purulent drainage and no erythema. Also the surgical wound from podiatry on the left first toe has an ischemic-looking eschar  over the surface of the tip of the toe I have elected not to attempt candidly debride this His wife as arranged for him to have follow-up noninvasive studies in [place] under the care of Dr. Andree Elk clinic. The question is he has known severe PAD. They had recently seen him in August. He has had revascularizations in both legs within the last 4 or 5 months. He is not complaining of pain and I cannot really get a history of claudication. He has not systemically unwell 09/20/2018 the patient has been to [facility] and been revascularized by Dr. Andree Elk earlier this week. Apparently he was able to open up the anterior tibial artery although I have not actually seen his formal report. He is going for an attempt to revascularize on the left on Monday. He is apparently working with an investigational stent for lower extremity arteries below the knee and he has talked to the patient about placing that on Monday if possible. The patient has been using silver alginate on the transmetatarsal amputation site and Santyl on the left 09/30/2018; patient had his revascularization on the left this apparently included a standard approach as well as a more distal arterial catheterization although I do not have any information on this from Dr. Andree Elk. In fact I do not even see the initial revascularization that he had on the right. I have included the arterial history from Dr. Andree Elk last note however below; ASSESSMENT/PLAN: 1. Hx of Critical limb ischemia bilateral lower extremities, PAD: -s/p transmetatarsal amputation of the RLE. -s/p right ATA percutaneous revascularization procedure x2, most recently 11/20/2016 s/p PTA of right AT 100% to less than 20% with a 2.5 x 200 balloon. 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 -03/21/2018 s/p PTA of 90% left popliteal artery to <10% with a  5x20 cutting balloon, PTA of 90% left peroneal to <20% with a 3x20 balloon, PTA of 100% left AT to <20% with a 2.5x220 balloon. 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 Will plan on the LLE soon after. Risks/benefits of procedure have been discussed and patient has elected to proceed. We have been using endoform to the right TMA amputation site wound and Santyl to the left great toe 10/07/2018; the area on the tip of his left great toe looked better we have been using Santyl here. We continue to have a very difficult probing hole on the right TMA amputation site we have been using endoform. I went on to use his fifth Oasis today 10/14/2018; the tip of the left great toe continues to look better. We have been using Santyl here the surface however is healthy and I think we can change to an alginate. The right TMA has not changed. Once again he has no superficial opening there is callus and thick subcutaneous tissue over the orifice once you remove this there is the probing area that we have been dealing with without too much change. There is no palpable bone I have been placing Oasis here and put Oasis #6 in this today after a more vigorous debridement 10/21/18; the left great toe still has necrotic  surface requiring debridement. The right TMA site hasn't changed in view of the thick callus over the wound bed. With removal of this there is still the opening however this does not appear to have the same depth. Again there is no palpable bone. Oasis was replaced 10/28/2018; patient comes in with both wounds looking worse. The area over the first toe tip is now down to bone. The area over the TMA site is deeper and down to bone clearly with a increase in overall wound area. Equally concerning on the right TMA is the complete absence of a pulse this week  which is a change. We are not even able to Doppler this. On the right he has a noncompressible ABI greater than 1.4 11/04/2018. Both wounds look somewhat worse. X-rays showed no osteomyelitis of the right foot but on the left there was underlying osteomyelitis in the left great toe distal phalanx. I been on the phone to Dr. Andree Elk surface at [facility] in [place] and they are arranging for another angiogram on the right on Monday. I have him on doxycycline for the osteomyelitis in the left great toe for now. Infectious disease may be necessary 1/17; 2-week hiatus. Patient was admitted to hospital at [place]. My understanding is he underwent an angioplasty of the right anterior tibial artery and had stents placed in the left anterior artery and the left tibial peroneal trunk. This was done by Dr. Andree Elk of interventional radiology. There is no major change in either 1 of the wounds. They have been using Aquacel Ag. As far as they are aware no imaging studies were done of the foot which is indeed unfortunate. I had him on doxycycline for 2 weeks since we identified the osteomyelitis in the left great toe by plain x-ray. I have renewed that again today. I am still suspicious about osteomyelitis in the amputation site and would consider doing another MRI to compare with the one done in September. The idea of hyperbaric oxygen certainly comes up for discussion 1/24; no major change in either wound area. I have him on doxycycline for osteomyelitis at the tip of the left great toe. As noted he has been previously and recently revascularized by Dr. Andree Elk at [place]. He is tolerating the doxycycline well. For some reason we do not have an infectious disease consult yet. Culture of drainage from the right foot site last week was negative 1/31; MRI of the right foot did not show osteomyelitis of the right ankle and foot. Notable for a skin ulceration overlying the second metatarsal stump with generalizing soft tissue  edema of the ankle and foot consistent with cellulitis. Noted to have a partial-thickness tear of the Achilles tendon 7.5 cm proximal to the insertion Nothing really new in terms of symptoms. Patient's area on the tip of the left great toe is just about closed although he still has the probing area on the metatarsal amputation site. Appointment with Dr. Linus Salmons of infectious disease next week 2/7; Dr. Novella Olive did not feel that any further antibiotics were necessary he would follow-up in 2 months. The left great toe appears to be closed still some surface callus that I gently looked under high did not see anything open or anything that was threatening to be open. He still has the open area on the mid part of his TMA site using endoform 2/14; left great toe is closed and he is completing his doxycycline as of last Sunday. He is not on any antibiotics. Unfortunately out of  the right foot his wife noticed some subdermal hemorrhage this week. He had been walking 2 miles I had given him permission to do so this is not really a plantar wound. Using endoform to this wound but I changed to silver alginate this week 2/21; left great toe remains closed. Culture last week grew Streptococcus angiosis which I am not really familiar with however it is penicillin sensitive and I am going to put him on Augmentin. Not much change in the wound on the right foot the deep area is still probing precariously close to bone and the wound on the margin of the TMA is larger. 2/28; left great toe remains closed. He is completing the Augmentin I gave him last week. 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 This is being shown to Dr. Andree Elk at [place] to see if there is anything else that can be done here. He has been using silver alginate strips on the right 3/6; left great toe remains closed. He sees Dr. Andree Elk on Monday. The area on the plantar aspect of the right foot has the same small orifice  with thick callused tissue around this. However this time with removal of the callus tissue the wound is open all the way along the incision line to the end medially. We have been using silver alginate. 3/13; left toe remains closed although the area is callused. He sees Dr. Jacqualyn Posey of podiatry next week. The area on the plantar right foot looked a lot better this week. Culture I did of this was negative we use silver alginate. He is going next week for an attempt at revascularization by Dr. Andree Elk 3/23; left toe remains closed although the area is callused. He will see Dr. Jacqualyn Posey in follow-up. The area on the right plantar foot continues to look surprisingly better over the last 3 visits. We have been using silver alginate. The revascularization he was supposed to have by Dr. Andree Elk at [facility] in [place] has been canceled [facility] procedure] 4/6; the right foot remains closed albeit callused. Podiatry canceled the appointment with regards to the left great toe. He has not seen Dr. Andree Elk at [facility] but thinks that Dr. Andree Elk has "done all he can do". He would be a candidate for the hemostaemix trial Readmission 07/29/2019 Mr. Stann Mainland is a man we know well from at least 3 previous stays in this clinic. He is a type II diabetic with severe PAD followed by Dr. Andree Elk at [facility] in [place]. 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 So far everything in both of these areas has remained closed. About 2 weeks ago he went to see [facility] podiat[facility] Babs Bertin. He had a thick callus on the left fifth metatarsal head that was shaved. He has developed an open wound in this area. They have been offloading this in his diabetic shoes. The patient has not had any more revascularizations by Dr. Andree Elk since the last time he was here. ABI in our clinic at the  posterior tibial on the left was 1.06 10/6; no real change in the area on the plantar met head. Small wound with 2 mm of depth. He has thick skin probably from pressure around the wound. We have been using silver alginate 10/13; small wound in the left fifth plantar met  head. Arrives today with undermining laterally and purulent drainage. Our intake nurse cultured this. His wife stated they noticed a change in color over the last day or 2. He is not systemically unwell 10/19; small wound on the fifth plantar metatarsal head. Culture I did last week showed Staphylococcus lugdunensis. Although this could be a skin contaminant the possibility of a skin and soft tissue infection was there. I did give him empiric doxycycline which should have covered this. We are using silver alginate to the wound. We put him in a total contact cast today. Electronic Signature(s) Signed: 08/18/2019 5:54:03 PM By: [name] MD Entered By: [name] on 08/18/2019 13:18:29 -------------------------------------------------------------------------------- Physical Exam Details Patient Name: Date of Service: [name], [name] 08/18/2019 11:30 AM Medical Record [id number] Patient Account Number: [id number] Date of Birth/Sex: 28-Nov-1945 (72 y.o. M) Treating RN: Levan Hurst Primary Care Provider: [name] Other Clinician: Referring Provider: Treating Provider/Extender:Keatyn Luck, [name], [name] in Treatment: 2 Constitutional Sitting or standing Blood Pressure is within target range for patient.. Pulse regular and within target range for patient.Marland Kitchen Respirations regular, non-labored and within target range.. Temperature is normal and within the target range for the patient.. No change in weight.. Notes Wound exam; the patient's wound did not look too much different from last week. No purulent drainage no erythema around the wound. Using a #3 curette circumferential eschar removed as well  there is debris on the wound surface minimal bleeding. Hemostasis with direct pressure. Silver alginate dressing total contact cast in the standard fashion Electronic Signature(s) Signed: 08/18/2019 5:54:03 PM By: [name] MD Entered By: [name] on 08/18/2019 13:19:56 -------------------------------------------------------------------------------- Physician Orders Details Patient Name: Date of Service: RANEY, KOEPPEN 08/18/2019 11:30 AM Medical Record [id number] Patient Account Number: [id number] Date of Birth/Sex: Mar 08, 1946 (73 y.o. M) Treating RN: Levan Hurst Primary Care Provider: [name] Other Clinician: Referring Provider: Treating Provider/Extender:Lundon Verdejo, [name], [name] in Treatment: 2 Verbal / Phone Orders: No Diagnosis Coding ICD-10 Coding Code Description E11.621 Type 2 diabetes mellitus with foot ulcer E11.51 Type 2 diabetes mellitus with diabetic peripheral angiopathy without gangrene L97.521 Non-pressure chronic ulcer of other part of left foot limited to breakdown of skin L03.116 Cellulitis of left lower limb Follow-up Appointments Return Appointment in: - Thursday for cast change Dressing Change Frequency Wound #5 Left Metatarsal head fifth Do not change entire dressing for one week. Wound Cleansing Wound #5 Left Metatarsal head fifth May shower with protection. Primary Wound Dressing Wound #5 Left Metatarsal head fifth Calcium Alginate with Silver Secondary Dressing Wound #5 Left Metatarsal head fifth Foam Dry Gauze Drawtex Off-Loading Total Contact Cast to Left Lower Extremity Electronic Signature(s) Signed: 08/18/2019 5:54:03 PM By: [name] MD Signed: 08/20/2019 6:50:57 PM By: Levan Hurst RN, BSN Entered By: Levan Hurst on 08/18/2019 12:41:50 -------------------------------------------------------------------------------- Problem List Details Patient Name: Date of Service: [name], [name]  08/18/2019 11:30 AM Medical Record [id number] Patient Account Number: [id number] Date of Birth/Sex: 10/15/1946 (72 y.o. M) Treating RN: Levan Hurst Primary Care Provider: [name] Other Clinician: Referring Provider: Treating Provider/Extender:Dakiyah Heinke, [name], [name] in Treatment: 2 Active Problems ICD-10 Evaluated Encounter Code Description Active Date Today Diagnosis E11.621 Type 2 diabetes mellitus with foot ulcer 07/29/2019 No Yes E11.51 Type 2 diabetes mellitus with diabetic peripheral 07/29/2019 No Yes angiopathy without gangrene L97.521 Non-pressure chronic ulcer of other part of left foot 07/29/2019 No Yes limited to breakdown of skin L03.116 Cellulitis of left lower limb 08/12/2019 No Yes Inactive Problems Resolved Problems Electronic Signature(s) Signed: 08/18/2019  5:54:03 PM By: [name] MD Entered By: [name] on 08/18/2019 13:15:36 -------------------------------------------------------------------------------- Progress Note Details Patient Name: Date of Service: ORELL, HURTADO 08/18/2019 11:30 AM Medical Record [id number] Patient Account Number: [id number] Date of Birth/Sex: 05-29-1946 (72 y.o. M) Treating RN: Levan Hurst Primary Care Provider: [name] Other Clinician: Referring Provider: Treating Provider/Extender:Chaniah Cisse, [name], [name] in Treatment: 2 Subjective History of Present Illness (HPI) 05/18/16; this is a 73year-old diabetic who is a type II diabetic on insulin. The history is that he traumatized his right foot developed a sore sometime in late March. Shortly thereafter he went on a cruise but he had to get off the cruise ship in [place] and fly urgently back to [place] where he was admitted to [facility] and ultimately underwent a transmetatarsal amputation by Dr. 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 According to the patient and his wife this wound never really healed.  He was seen on 2 occasions in the wound care center in [place] and had vascular studies and then was referred urgently to Dr. Bridgett Larsson of vascular surgery. He underwent an angiogram on 05/10/16. Unfortunately nothing really could be done to improve his vascular status. He had a 75-90% stenosis in the midsegment of 1 segment of the posterior femoral artery. He had a patent popliteal, his anterior tibial occluded shortly after takeoff. Perineal had a greater than 90% stenosis posterior tibial is occluded feet had no distal collaterals feed distal aspect of the transmetatarsal amputation site. The patient tells me that he had a prolonged period of Santyl by Dr. Doran Durand was some initial improvement but then this was stopped. I think they're only applying daily dressings/dry dressings. He has not had a recent x-ray of the right foot he did have one before his surgery in April. His wife by the dimensions of the wound/surgical site being followed at home since 4/20. At that point the dimensions were 0.5 x 12 x 0.2 on 7/19 this was 1.8 x 6 x 0.4. He is not currently on any antibiotics. His hemoglobin A1c in early April was 12.9 at that point he was started on insulin. Apparently his blood sugars are much lower he has an appointment with Dr. Legrand Como Alteimer of endocrine next week. 05/29/16 x-ray of the area did not show osteomyelitis. I think he probably needs an MRI at this point. His wife is asking about something called"Yireh" cream which is not FDA approved. I have not heard of this. 06/22/16; MRI did not really suggest osteomyelitis. There was minimal marrow edema and enhancement in the stump of the second metatarsal felt to be secondary likely to postoperative change rather than osteomyelitis. The patient has arranged his own consultation with Dr. Andree Elk at [place], apparently their daughter lives in [place] and has some connection here. Any improvement in vascular supply by Dr. Andree Elk would of course be helpful. Dr.  Bridgett Larsson did not feel that anything further could be done other than amputation if wound care did not result in healing or if the area deteriorates. 06/26/16; the patient has been to see Dr. Andree Elk at [place] and had an angiogram. He is going for a procedure on Thursday which will involve catheterization. I'm not sure if this is an anterograde or retrograde approach. He has been using Santyl to the wound 07/10/16; the patient had a repeat angiogram and angioplasty at [place] by Dr. Brunetta Jeans. His angiogram showed right CFA and profundal widely patent. The right as of a.m. popliteal artery were widely patent the  right anterior tibial was occluded proximally and reconstitutes at the ankle. Peroneal artery was patent to the foot. Posterior tibial artery was occluded. The patient had angioplasty of the anterior tibial artery.. This was quite successful. He was recommended for Plavix as well as aspirin. 07/17/16; the patient was close to be a nurse visit today however the outer dressing of the Apligraf fell off. Noted drainage. I was asked to see the wound. The patient is noted an odor however his wife had noted that. Drainage with Apligraf not necessarily a bad thing. He has not been systemically unwell 07/24/16; we are still have an issue with drainage of this wound. In spite of this I applied his second Apligraf. Medially the area still is probing to bone. 08/07/16; Apligraf reapplied in general wound looks improved. 08/21/16 Apligraf #4. Wound looks much better 09/04/16 patientt's wound again today continues to appear to improve with the application of the Apligraf's. He notes no increased discomfort or concerns at this point in time. 09/18/16; the patient returns today 2 weeks after his fifth application of Apligraf. Predictably three quarters of the width of this wound has healed. The deep area that probe to bone medially is still open. The patient asked how much out-of-pocket dollars would be for additional  Apligraf's. 09/25/16; now using Hydrofera Blue. He has completed 5 Apligraf applications with considerable improvement in this deep open transmetatarsal amputation site. His wound is now a triangular-shaped wound on the medial aspect. At roughly 12 to 2:00 this probes another centimeter but as opposed to in the past this does not probe to bone. The patient has been seen at [facility] by Dr. Zenia Resides. He is not planning to do any more revascularization unless the wound stalls or worsens per the patient 10/02/16; 0.7 x 0.8 x 0.8. Unfortunately although the wound looks stable to improved. There is now easily probable bone. This hasn't been present for several weeks. Patient is not otherwise symptomatic he is not experiencing any pain. I did a culture of the wound bed 10/09/16. Deterioration last week. Culture grew MRSA and although there is improvement here with doxycycline prescribed over the phone I'm going to try to get him linezolid 600 twice a day for 10 days today. 10/16/16; he is completing a weeks worth of linezolid and still has 3 more days to go. Small triangular-shaped open area with some degree of undermining. There is still palpable bone with a curet. Overall the area appears better than last week 10/20/16 he has completed the linezolid still having some nausea and vomiting but no diarrhea. He has exposed bone this week which is a deterioration. 10/27/16 patient now has a small but probing wound down to bone. Culture of this bone that I did last week showed a few methicillin-resistant staph aureus. I have little doubt that this represents acute/subacute osteomyelitis. The patient is currently on Doxy which I will continue he also completed 10 days of linezolid. We are now in a difficult situation with this patient's foot after considerable discussion we will send him back to see Dr. Doran Durand for a surgical opinion of this I'm also going to try to arrange a infectious disease consult at [facility]  hopefully week and get this prior to her usual 4-6 weeks we having [place]. The patient clearly is going to need 6 weeks of IV vancomycin. If we cannot arrange this expediently I'll have to consider ordering this myself through a home infusion company 11/03/16; the patient now has a small in terms of  circumference but probing wound. No bone palpable today. The patient remains on doxycycline 100 twice a day which should support him until he sees infectious disease at [facility] next week the following week on Wednesday I believe he has an appointment with Dr. Andree Elk at [facility] who is his vascular cardiologist. Finally he has an appointment with Dr. Doran Durand on 11/22/16 we have been using silver alginate. The patient's wife states they are having trouble getting this through [facility] 11/13/16; the patient was seen by infectious disease at [facility] in the 11th PICC line placed in preparation for IV antibiotics. A tummy he has not going to get IV vancomycin o Ceftaroline. They've also ordered an MRI. Patient has a follow-up with Dr. Doran Durand on 11/22/16 and Dr. Andree Elk at [facility] tomorrow 11/23/16 the patient is on daptomycin as directed by infectious disease at [facility]. He is also been back to see Dr. Andree Elk at [facility]. He underwent a repeat arteriogram. He had a successful PTA of the right anterior tibial artery. He is on dual antiplatelete treatment with Plavix and aspirin. Finally he had the MRI of his foot in [place]. This showed cellulitis about the foot worse distally edema and enhancement in the reminiscent of the second metatarsal was consistent with osteomyelitis therefore what I was assuming to be the first metatarsal may be actually the second. He also has a fluid collection deep to the calcaneus at the level of the calcaneal spur which could be an abscess or due to adventitial bursitis. He had a small tear in his Achilles 11/30/16; the patient continues on daptomycin as directed by infectious  disease at [facility]. He is been revascularized by Dr. Andree Elk at [place] in [place]. He has been to see Gretta Arab who was the orthopedic surgeon who did his original amputation. I have not seen his not however per the patient's wife he did not offer another surgical local surgical prodecure to remove involved bone. He verbalized his usual disbelief in not just hyperbarics but any medical therapy for this condition(osteomyelitis). I discussed this in detail with the patient today including answering the question about a BKA definitively "curing" the current condition. 12/07/16; the patient continues on daptomycin as directed by infectious disease at [facility]. This is directed at the MRSA that we cultured from his bone debridement from 12/22. Lab work today shows a white count of 8.7 hemoglobin of 10.5 which is microcytic and hypochromic differential count shows a slightly elevated monocyte count at 1.2 eosinophilic count of 0.6. His creatinine is 1.11 sedimentation rate apparently is gone from 35-34 now 40. I explained was wife I don't think this represents a trend. His total CK is 48 12/14/16- patient is here for follow-up evaluation of his right TMA site. He continues to receive IV daptomycin per infectious disease. His serum inflammatory markers remain elevated. He complains of intermittent pain to the medial aspect of the TMA site with intermittent erythema. He voices no complaints or concerns regarding hyperbaric therapy. Overall he and his wife are expressing a frustration and discouragement regarrding the length of time of treatment. 12/21/16; small open wound at roughly the first or second metatarsal metatarsalphalyngeal joint reminiscence of his transmetatarsal amputation site he continues to receive IV daptomycin per infectious disease at [facility]. He will finish these a week tomorrow. He has lab work which I been copied on. His white count is 10.8 hemoglobin 8.9 MCV is low at 75., MCH low  at 24.5 platelet count slightly elevated at 626. Differential count  shows 70% neutrophils 10% monocytes and 10% eosinophils. His comprehensive metabolic panel shows a slightly low sodium at 133 albumin low at 3.1 total CK is normal at 42 sedimentation rate is much higher at 82. He has had iron studies that show a serum iron of 15 and iron binding capacity of 238 and iron saturation of 6. This is suggestive of iron deficiency. B12 and folate were normal ferritin at 113 The patient tells me that he is not eating well and he has lost weight. He feels episodically nauseated. He is coughing and gagging on mucus which she thinks is sinusitis. He has an appointment with his primary doctor at 5:00 this afternoon in [facility] 01/02/17; the patient developed a subacute pneumonitis. He was admitted to [facility] after a CT scan showed an extensive interstitial pneumonitis [I have not yet seen this]. 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 He has since been discharged. He is not on oxygen. He feels fatigued and very short of breath with exertion. At [facility] the wound care nurse there felt that his wound was healed. He did complete his daptomycin and has follow-up with infectious disease on Friday. X-rays I did before he went to [facility] still suggested residual osteomyelitis in the anterior aspect of the second must metatarsal head. I'm not sure I would've expected any different. There was no other findings. I actually think I did this because of erythema over the first metatarsal head reminiscent 01/11/17; the patient has eosinophilic pneumonitis. He has been reviewed by pulmonology and given clearance for hyperbarics at least that's what his wife says. I'll need to see if there is note in care everywhere. Apparently the prognosis for improvement of daptomycin induced eosinophilic granulocyte is is 3  months without steroids. In the meantime infectious disease has placed him on doxycycline until the wound is closed. He is still is lost a lot of weight and his blood sugars are running in the mid 60s to low 80s fasting and at all times during the day 01/18/17; he has had adjustments in his insulin apparently his blood sugars in the morning or over 100. He wants to restart his hyperbaric treatment we'll do this at 1:00. 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 I think there is good reason to complete his treatments in order to give him the best chance of maintaining a healed status and these DFU 3 wounds with MRSA infection in the bone 02/15/17; the patient was seen today in conjunction with HBO. He completed hyperbaric oxygen today. The open area on his transmetatarsal site has remained closed. There was an area of erythema when I saw him earlier in the week on the posterior heel although that is resolved as of today as well. He has been using a cam walker. This is a patient who came to [place] after a transmetatarsal amputation that was necrotic and dehisced. He required revascularization percutaneously on 2 different occasions by Dr. Andree Elk of invasive cardiology at [facility]. 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 He went to [facility] infectious disease and had IV daptomycin for 5 weeks before developing eosinophilic pneumonitis and requiring an admission to hospital/ICU. He made a good recovery and is continued on doxycycline since. As mentioned his foot is closed now. He has a  follow-up with Dr. Andree Elk tomorrow. He is going to [employer] on Monday for a custom-made shoe READMISSION Last visit Dr. Andree Elk [facility] 02/16/17 1. Critical limb ischemia of the RLE:  s/p transmetatarsal amputation of the RLE, now  completely healed.  s/p right ATA percutaneous revascularization procedure x2, most recently 11/20/2016 s/p PTA of right AT 100% to less than 20% with a 2.5 x 200 balloon.  He does have some residual osteomyelitis, but given the wound is closed, the orthopedist has recommended to follow. He has a fitting for a special shoe coming up next week. He has completed a course of daptomycin and doxycycline and he has been discharged by ID. He has completed a course of hyperbaric therapy.  Continue Continue medical management with aspirin, Plavix and statin therapy. We will discuss ongoing Plavix therapy at follow-up in 6 months.  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 Given no symptoms, we will conservatively manage. He doesn't want to do any more invasive studies at this time, which is reasonable. The patient arrives today out of 2 concerns both on the right transmetatarsal site. 1 at the level of the reminiscent fifth metatarsal head and the other at roughly the first or second. Both of these look like dark subcutaneous discoloration probably subdermal bleeding. He is recently obtained new adaptive footwear for the right foot. He also has a callus on the left fifth dorsal toe however he follows with podiatry for this and I don't think this is any issue. ABIs in this clinic today were 0.66 on the right and 1.06 on the left. On entrance into our clinic initially this was 0.95 and 0.92. He does not describe current claudication. They're going away on a cruise in 8 weeks and I think are trying to do the month is much as they can proactively. They follow with podiatry and have an appointment with Dr. Andree Elk in October READMISSION 06/25/18 This is a patient that we have not seen in almost a year. He is a type II diabetic with known PAD. He is followed by Dr. Andree Elk of  interventional cardiology at [facility] in [place]. He is required revascularization for significant PAD. When we first saw him he required a transmetatarsal amputation. He had underlying osteomyelitis with a nonhealing surgical wound. This eventually closed with wound care, IV antibiotics and hyperbaric oxygen. They tell me that he has a modified shoe and he is very active walking up to 4 miles a day. He is followed by Dr. Geroge Baseman of podiatry. His wife states that he underwent a removal of callus over this site on July 9. She felt there may be drainage from this site after that although she could never really determined and there was callus buildup again. On 06/01/18 there was pressure bleeding through the overlying callus. he was given a prescription for 7 days of Bactrim. Fortuitously he has an appointment with Dr. Andree Elk on 06/04/18 and he immediately underwent revascularization of the right leg although I have not had a chance to review these records in care everywhere. This was apparently done through anterior and retrograde access. On 06/06/18 he had another debridement by Dr. Bernette Mayers. Vitamin soaking with Epsom salts for 20 minutes and applying calcium alginate. The original trans-met was on April 2017 I believe by Dr. Doran Durand. His ABI in our clinic was noncompressible today. 07/02/18; x-ray I ordered last week  was negative for osteomyelitis. Swab culture was also negative. He is going to require an MRI which I have ordered today. 07/09/18; surprisingly the MRI of the foot that I ordered did not show osteomyelitis. He did suggest the possibility of cellulitis. For this reason I'll go ahead and give him a 10 day course of doxycycline. Although the previous culture of this area was negative 07/16/18; it arrives with the wound looking much the same. Roughly the same depth. He has thick subcutaneous tissue around the wound orifice but this still has roughly the same depth. Been using silver alginate. We  applied Oasis #1 today 07/23/2018; still having to remove a lot of callus and thick subcutaneous tissue to actually define the wound here. Most of this seems to have closed down yet he has a comma shaped divot over the top of the area that still I think is open. We applied Oasis #2 His wife expressed concern about the tip of his left great toe. This almost looks like a small blister. She also showed it today to her podiatrist Dr. Geroge Baseman who did not think this was anything serious. I am not sure is anything serious either however I think it bears some watching. He is not in any pain however he is insensate 07/30/2018;; still on a lot of nonviable tissue over the surface of the wound however cleaning this up reveals a more substantial wound orifice but less of a probing wound depth. This is not probed to bone. There is no evidence of infection The wife is still concerned about a non-open area on the tip of his left great toe. Almost feels like a bony outgrowth. She had previously showed this to podiatry. I do not think this is a blister. A friction area would be possible although he is really not walking according to his wife. He had a small skin tag on his right buttock but no open wound here either 08/06/2018; we applied a third Oasis last week. Unfortunately there is really no improvement. Still requiring extensive debridement to expose the wound bed from a horizontal slitlike depression. I still have not been able to get this to fill in properly. On the positive side there is now no probable bone from when he first came into the facility. I changed him to silver alginate today after a reasonably aggressive debridement 08/13/2018; once again the patient comes in with skin and subcutaneous tissue closing over the small probing area with the underlying cavity of the wound on the right TMA site. I applied silver alginate to this last week. Prior to that we used Oasis x3 still not able to get this  area granulating. He does not have a probing area of the bone which is an improvement from when I spur started working on this and an MRI did not suggest osteomyelitis. I do not see evidence of infection here but I am increasingly concerned about why I cannot get this area to granulate. Each time I debrided this is looks like this is simply a matter of getting granulation to fill in the hole and then getting epithelialization. This does not seem to happen Also I sent him back to see podiatry Dr. Earleen Newport about the what felt to be bony outgrowth on the tip of his left great toe. Apparently after heel he left the clinic last week or the next day he developed a blood blister. He did see Dr. Earleen Newport. He went on to have a debridement of the medial nail cuticle  he now has an open area here as well as some denuded skin. They did an x-ray apparently does have a bony outgrowth or spur but I am not able to look at this. They are using topical antibiotics apparently there was some suggested he use Santyl. Patient's wife was anxious for my opinion of this 08/20/2018; we are able to keep the wound open this time instead of the thick subcutaneous tissue closing over the top of it however unfortunately once again this probes to bone. I do not see any evidence of infection and previous MRI did not show osteomyelitis. I elected to go back to the Oasis to see if we can stimulate some granulation. Last saw his vascular interventional cardiologist Dr. Andree Elk at the beginning of August and he had a repeat procedure. Nevertheless I wonder how much blood flow he has down to this area With regards to the left first toe he is seeing Dr. Earleen Newport next week. They are applying Bactroban to this area. 08/27/2018 ooOnce again he comes in with thick eschar and subcutaneous tissue over the top of the small probing hole. This does not appear to go down to bone but it still has roughly the same depth. I reapplied Oasis today ooOver  the left great toe there appears to be more of the wound at the tip of his toe than there was last week. This has an eschar on the surface of it. Will change to Santyl. There is seeing podiatry this afternoon 09/03/2018 ooHe comes in today with the area on the transmetatarsal's site with a fair amount of callus, nonviable tissue over the circumference but it was not closed. I removed all of this as well as some subcutaneous debris and reapplied Oasis. There is no exposed bone ooThe area over the tip of the left great toe started off as a nodule of uncertain etiology. He has been followed with podiatry. They have been removing part of the medial nail bed. He has nonviable tissue over the wound he been using Santyl in this area 09/10/18 ooUnfortunately comes in with neither wound area looking improved. The transmetatarsal amputation site once Again has nonviable debris over the surface requiring debridement. Unfortunately underneath this there is nothing that looks viable and this once again goes right down to bone. There is no purulent drainage and no erythema. ooAlso the surgical wound from podiatry on the left first toe has an ischemic-looking eschar over the surface of the tip of the toe I have elected not to attempt candidly debride this ooHis wife as arranged for him to have follow-up noninvasive studies in [place] under the care of Dr. Andree Elk clinic. The question is he has known severe PAD. They had recently seen him in August. He has had revascularizations in both legs within the last 4 or 5 months. He is not complaining of pain and I cannot really get a history of claudication. He has not systemically unwell 09/20/2018 the patient has been to [facility] and been revascularized by Dr. Andree Elk earlier this week. Apparently he was able to open up the anterior tibial artery although I have not actually seen his formal report. He is going for an attempt to revascularize on the left on  Monday. He is apparently working with an investigational stent for lower extremity arteries below the knee and he has talked to the patient about placing that on Monday if possible. The patient has been using silver alginate on the transmetatarsal amputation site and Santyl on the left  09/30/2018; patient had his revascularization on the left this apparently included a standard approach as well as a more distal arterial catheterization although I do not have any information on this from Dr. Andree Elk. In fact I do not even see the initial revascularization that he had on the right. I have included the arterial history from Dr. Andree Elk last note however below; ASSESSMENT/PLAN: 1. Hx of Critical limb ischemia bilateral lower extremities, PAD: -s/p transmetatarsal amputation of the RLE. -s/p right ATA percutaneous revascularization procedure x2, most recently 11/20/2016 s/p PTA of right AT 100% to less than 20% with a 2.5 x 200 balloon. 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 -03/21/2018 s/p PTA of 90% left popliteal artery to <10% with a 5x20 cutting balloon, PTA of 90% left peroneal to <20% with a 3x20 balloon, PTA of 100% left AT to <20% with a 2.5x220 balloon. 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 Will plan on the LLE soon after. Risks/benefits of procedure have been discussed and patient has elected to proceed. We have been using endoform to the right TMA amputation site wound and Santyl to the left great toe 10/07/2018; the area on the tip of his left great toe looked better we have been using Santyl here. We continue to have a very difficult probing hole on the right TMA amputation site we have been using endoform. I  went on to use his fifth Oasis today 10/14/2018; the tip of the left great toe continues to look better. We have been using Santyl here the surface however is healthy and I think we can change to an alginate. The right TMA has not changed. Once again he has no superficial opening there is callus and thick subcutaneous tissue over the orifice once you remove this there is the probing area that we have been dealing with without too much change. There is no palpable bone I have been placing Oasis here and put Oasis #6 in this today after a more vigorous debridement 10/21/18; the left great toe still has necrotic surface requiring debridement. The right TMA site hasn't changed in view of the thick callus over the wound bed. With removal of this there is still the opening however this does not appear to have the same depth. Again there is no palpable bone. Oasis was replaced 10/28/2018; patient comes in with both wounds looking worse. The area over the first toe tip is now down to bone. The area over the TMA site is deeper and down to bone clearly with a increase in overall wound area. Equally concerning on the right TMA is the complete absence of a pulse this week which is a change. We are not even able to Doppler this. On the right he has a noncompressible ABI greater than 1.4 11/04/2018. Both wounds look somewhat worse. X-rays showed no osteomyelitis of the right foot but on the left there was underlying osteomyelitis in the left great toe distal phalanx. I been on the phone to Dr. Andree Elk surface at [facility] in [place] and they are arranging for another angiogram on the right on Monday. I have him on doxycycline for the osteomyelitis in the left great toe for now. Infectious disease may be necessary 1/17; 2-week hiatus. Patient was  admitted to hospital at [place]. My understanding is he underwent an angioplasty of the right anterior tibial artery and had stents placed in the left anterior artery and  the left tibial peroneal trunk. This was done by Dr. Andree Elk of interventional radiology. There is no major change in either 1 of the wounds. They have been using Aquacel Ag. As far as they are aware no imaging studies were done of the foot which is indeed unfortunate. I had him on doxycycline for 2 weeks since we identified the osteomyelitis in the left great toe by plain x-ray. I have renewed that again today. I am still suspicious about osteomyelitis in the amputation site and would consider doing another MRI to compare with the one done in September. The idea of hyperbaric oxygen certainly comes up for discussion 1/24; no major change in either wound area. I have him on doxycycline for osteomyelitis at the tip of the left great toe. As noted he has been previously and recently revascularized by Dr. Andree Elk at [place]. He is tolerating the doxycycline well. For some reason we do not have an infectious disease consult yet. Culture of drainage from the right foot site last week was negative 1/31; MRI of the right foot did not show osteomyelitis of the right ankle and foot. Notable for a skin ulceration overlying the second metatarsal stump with generalizing soft tissue edema of the ankle and foot consistent with cellulitis. Noted to have a partial-thickness tear of the Achilles tendon 7.5 cm proximal to the insertion Nothing really new in terms of symptoms. Patient's area on the tip of the left great toe is just about closed although he still has the probing area on the metatarsal amputation site. Appointment with Dr. Linus Salmons of infectious disease next week 2/7; Dr. Novella Olive did not feel that any further antibiotics were necessary he would follow-up in 2 months. The left great toe appears to be closed still some surface callus that I gently looked under high did not see anything open or anything that was threatening to be open. He still has the open area on the mid part of his TMA site using endoform 2/14;  left great toe is closed and he is completing his doxycycline as of last Sunday. He is not on any antibiotics. Unfortunately out of the right foot his wife noticed some subdermal hemorrhage this week. He had been walking 2 miles I had given him permission to do so this is not really a plantar wound. Using endoform to this wound but I changed to silver alginate this week 2/21; left great toe remains closed. Culture last week grew Streptococcus angiosis which I am not really familiar with however it is penicillin sensitive and I am going to put him on Augmentin. Not much change in the wound on the right foot the deep area is still probing precariously close to bone and the wound on the margin of the TMA is larger. 2/28; left great toe remains closed. He is completing the Augmentin I gave him last week. 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 This is being shown to Dr. Andree Elk at [facility] to see if there is anything else that can be done here. He has been using silver alginate strips on the right 3/6; left great toe remains closed. He sees Dr. Andree Elk on Monday. The area on the plantar aspect of the right foot has the same small orifice with thick callused tissue around this. However this time with removal  of the callus tissue the wound is open all the way along the incision line to the end medially. We have been using silver alginate. 3/13; left toe remains closed although the area is callused. He sees Dr. Jacqualyn Posey of podiatry next week. The area on the plantar right foot looked a lot better this week. Culture I did of this was negative we use silver alginate. He is going next week for an attempt at revascularization by Dr. Andree Elk 3/23; left toe remains closed although the area is callused. He will see Dr. Jacqualyn Posey in follow-up. The area on the right plantar foot continues to look surprisingly better over the last 3 visits. We have been using silver alginate. The  revascularization he was supposed to have by Dr. Andree Elk at [facility] in [place] has been canceled [facility] procedure] 4/6; the right foot remains closed albeit callused. Podiatry canceled the appointment with regards to the left great toe. He has not seen Dr. Andree Elk at [facility] but thinks that Dr. Andree Elk has "done all he can do". He would be a candidate for the hemostaemix trial Readmission 07/29/2019 Mr. Stann Mainland is a man we know well from at least 3 previous stays in this clinic. He is a type II diabetic with severe PAD followed by Dr. Andree Elk at [facility] in [place]. 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 So far everything in both of these areas has remained closed. About 2 weeks ago he went to see his podiatrist at [facility] Dr. Babs Bertin. He had a thick callus on the left fifth metatarsal head that was shaved. He has developed an open wound in this area. They have been offloading this in his diabetic shoes. The patient has not had any more revascularizations by Dr. Andree Elk since the last time he was here. ABI in our clinic at the posterior tibial on the left was 1.06 10/6; no real change in the area on the plantar met head. Small wound with 2 mm of depth. He has thick skin probably from pressure around the wound. We have been using silver alginate 10/13; small wound in the left fifth plantar met head. Arrives today with undermining laterally and purulent drainage. Our intake nurse cultured this. His wife stated they noticed a change in color over the last day or 2. He is not systemically unwell 10/19; small wound on the fifth plantar metatarsal head. Culture I did last week showed Staphylococcus lugdunensis. Although this could be a skin contaminant the possibility of a skin and soft tissue infection was there. I did give him empiric doxycycline which should  have covered this. We are using silver alginate to the wound. We put him in a total contact cast today. Objective Constitutional Sitting or standing Blood Pressure is within target range for patient.. Pulse regular and within target range for patient.Marland Kitchen Respirations regular, non-labored and within target range.. Temperature is normal and within the target range for the patient.. No change in weight.. Vitals Time Taken: 11:55 AM, Temperature: 98.7 F, Pulse: 81 bpm, Respiratory Rate: 18 breaths/min, Blood Pressure: 117/68 mmHg. General Notes: Wound exam; the patient's wound did not look too much different from last week. No purulent drainage no erythema around the wound. Using a #3 curette circumferential eschar removed as well there is debris on the wound surface minimal bleeding. Hemostasis with direct pressure. Silver  alginate dressing total contact cast in the standard fashion Integumentary (Hair, Skin) Wound #5 status is Open. Original cause of wound was Gradually Appeared. The wound is located on the Left Metatarsal head fifth. The wound measures 1cm length x 1.5cm width x 0.1cm depth; 1.178cm^2 area and 0.118cm^3 volume. There is Fat Layer (Subcutaneous Tissue) Exposed exposed. There is no tunneling or undermining noted. There is a small amount of serosanguineous drainage noted. The wound margin is thickened. There is large (67-100%) red, pink granulation within the wound bed. There is no necrotic tissue within the wound bed. Assessment Active Problems ICD-10 Type 2 diabetes mellitus with foot ulcer Type 2 diabetes mellitus with diabetic peripheral angiopathy without gangrene Non-pressure chronic ulcer of other part of left foot limited to breakdown of skin Cellulitis of left lower limb Procedures Wound #5 Pre-procedure diagnosis of Wound #5 is a Diabetic Wound/Ulcer of the Lower Extremity located on the Left Metatarsal head fifth .Severity of Tissue Pre Debridement is: Fat layer  exposed. There was a Excisional Skin/Subcutaneous Tissue Debridement with a total area of 1.5 sq cm performed by Ricard Dillon., MD. With the following instrument(s): Curette to remove Viable and Non-Viable tissue/material. Material removed includes Callus and Subcutaneous Tissue and. No specimens were taken. A time out was conducted at 12:42, prior to the start of the procedure. A Minimum amount of bleeding was controlled with Pressure. The procedure was tolerated well with a pain level of 0 throughout and a pain level of 0 following the procedure. Post Debridement Measurements: 1cm length x 1.5cm width x 0.1cm depth; 0.118cm^3 volume. Character of Wound/Ulcer Post Debridement is improved. Severity of Tissue Post Debridement is: Fat layer exposed. Post procedure Diagnosis Wound #5: Same as Pre-Procedure Pre-procedure diagnosis of Wound #5 is a Diabetic Wound/Ulcer of the Lower Extremity located on the Left Metatarsal head fifth . There was a Total Contact Cast Procedure by Ricard Dillon., MD. Post procedure Diagnosis Wound #5: Same as Pre-Procedure Plan Follow-up Appointments: Return Appointment in: - Thursday for cast change Dressing Change Frequency: Wound #5 Left Metatarsal head fifth: Do not change entire dressing for one week. Wound Cleansing: Wound #5 Left Metatarsal head fifth: May shower with protection. Primary Wound Dressing: Wound #5 Left Metatarsal head fifth: Calcium Alginate with Silver Secondary Dressing: Wound #5 Left Metatarsal head fifth: Foam Dry Gauze Drawtex Off-Loading: Total Contact Cast to Left Lower Extremity 1. Silver alginate under a total contact cast 2. He is completing the doxycycline, I did not think any more additional antibiotics were necessary 3. We will have a look at his foot in 3 days when he comes back for the obligatory first total contact cast change Electronic Signature(s) Signed: 08/18/2019 5:54:03 PM By: [name]  MD Entered By: [name] on 08/18/2019 13:20:38 -------------------------------------------------------------------------------- Total Contact Cast Details Patient Name: Date of Service: ADOLPHO, MEENACH 08/18/2019 11:30 AM Medical Record [id number] Patient Account Number: [id number] Date of Birth/Sex: 1946/09/12 (72 y.o. M) Treating RN: Levan Hurst Primary Care Provider: [name] Other Clinician: Referring Provider: Treating Provider/Extender:Sani Madariaga, [name], [name] in Treatment: 2 Total Contact Cast Applied for Wound Assessment: Wound #5 Left Metatarsal head fifth Performed By: Physician Ricard Dillon., MD Post Procedure Diagnosis Same as Pre-procedure Electronic Signature(s) Signed: 08/18/2019 5:54:03 PM By: [name] MD Entered By: [name] on 08/18/2019 13:16:06 -------------------------------------------------------------------------------- SuperBill Details Patient Name: Date of Service: MCDONALD, REILING 08/18/2019 Medical Record [id number] Patient Account Number: [id number] Date of Birth/Sex: Treating RN: 06-Jun-1946 (73 y.o. [name]  Primary Care Provider: [name] Other Clinician: Referring Provider: Treating Provider/Extender:Janiya Millirons, [name], [name] in Treatment: 2 Diagnosis Coding ICD-10 Codes Code Description E11.621 Type 2 diabetes mellitus with foot ulcer E11.51 Type 2 diabetes mellitus with diabetic peripheral angiopathy without gangrene L97.521 Non-pressure chronic ulcer of other part of left foot limited to breakdown of skin L03.116 Cellulitis of left lower limb Facility Procedures CPT4 Code Description: 25366440 11042 - DEB SUBQ TISSUE 20 SQ CM/< ICD-10 Diagnosis Description L97.521 Non-pressure chronic ulcer of other part of left foot limite Modifier: d to breakdo Quantity: 1 wn of skin Physician Procedures CPT4 Code Description: 3474259 56387 - WC PHYS SUBQ TISS 20 SQ CM ICD-10  Diagnosis Description L97.521 Non-pressure chronic ulcer of other part of left foot limite Modifier: d to breakdo Quantity: 1 wn of skin Electronic Signature(s) Signed: 08/18/2019 5:54:03 PM By: [name] MD Entered By: [name] on 08/18/2019 13:21:02

## 2019-08-21 ENCOUNTER — Encounter (HOSPITAL_BASED_OUTPATIENT_CLINIC_OR_DEPARTMENT_OTHER): Payer: Medicare Other | Admitting: Internal Medicine

## 2019-08-21 ENCOUNTER — Other Ambulatory Visit: Payer: Self-pay

## 2019-08-21 DIAGNOSIS — E11621 Type 2 diabetes mellitus with foot ulcer: Secondary | ICD-10-CM | POA: Diagnosis not present

## 2019-08-22 NOTE — Progress Notes (Signed)
[name], [name] ([id number]) Visit Report for 08/21/2019 Arrival Information Details Patient Name: Date of Service: [name], [name] 08/21/2019 11:15 AM Medical Record [id number] Patient Account Number: [id number] Date of Birth/Sex: Treating RN: 1946/03/13 (73 y.o. Daphane ShepherdM) Lynch, Emeterio ReeveShatara Primary Care Odile Veloso: Selinda FlavinHoward, Kevin Other Clinician: Referring Amiayah Giebel: Treating Mando Blatz/Extender:[name], Peter CongoMichael Howard, Wadie LessenKevin Weeks in Treatment: 3 Visit Information History Since Last Visit Added or deleted any medications: No Patient Arrived: Ambulatory Any new allergies or adverse reactions: No Arrival Time: 11:08 Had a fall or experienced change in No Accompanied By: wife activities of daily living that may affect Transfer Assistance: None risk of falls: Patient Identification Verified: Yes Signs or symptoms of abuse/neglect since No Secondary Verification Process Yes last visito Completed: Hospitalized since last visit: No Patient Requires Transmission-Based No Implantable device outside of the clinic No Precautions: excluding Patient Has Alerts: No cellular tissue based products placed in the center since last visit: Has Dressing in Place as Prescribed: Yes Has Footwear/Offloading in Place as Yes Prescribed: Left: Total Contact Cast Pain Present Now: No Electronic Signature(s) Signed: 08/22/2019 5:59:58 PM By: Zandra AbtsLynch, Shatara RN, BSN Entered By: Zandra AbtsLynch, Shatara on 08/21/2019 11:09:04 -------------------------------------------------------------------------------- Encounter Discharge Information Details Patient Name: Date of Service: [name], [name] 08/21/2019 11:15 AM Medical Record [id number] Patient Account Number: [id number] Date of Birth/Sex: Treating RN: 1946/03/13 24(72 y.o. Tammy SoursM) Deaton, Bobbi Primary Care Maylene Crocker: Selinda FlavinHoward, Kevin Other Clinician: Referring Niara Bunker: Treating Lillee Mooneyhan/Extender:[name], Peter CongoMichael Howard, Wadie LessenKevin Weeks in Treatment: 3 Encounter Discharge  Information Items Discharge Condition: Stable Ambulatory Status: Ambulatory Discharge Destination: Home Transportation: Private Auto Accompanied By: wife Schedule Follow-up Appointment: Yes Clinical Summary of Care: Electronic Signature(s) Signed: 08/21/2019 6:35:09 PM By: Shawn Stalleaton, Bobbi Entered By: Shawn Stalleaton, Bobbi on 08/21/2019 17:43:58 -------------------------------------------------------------------------------- Lower Extremity Assessment Details Patient Name: Date of Service: [name], [name] 08/21/2019 11:15 AM Medical Record [id number] Patient Account Number: [id number] Date of Birth/Sex: Treating RN: 1946/03/13 (72 y.o. Elizebeth KollerM) Lynch, Shatara Primary Care Nelson Julson: Selinda FlavinHoward, Kevin Other Clinician: Referring Colbert Curenton: Treating Ranyah Groeneveld/Extender:[name], Peter CongoMichael Howard, Wadie LessenKevin Weeks in Treatment: 3 Edema Assessment Assessed: [Left: No] [Right: No] Edema: [Left: N] [Right: o] Calf Left: Right: Point of Measurement: cm From Medial Instep 39 cm cm Ankle Left: Right: Point of Measurement: cm From Medial Instep 26 cm cm Electronic Signature(s) Signed: 08/22/2019 5:59:58 PM By: Zandra AbtsLynch, Shatara RN, BSN Entered By: Zandra AbtsLynch, Shatara on 08/21/2019 17:28:40 -------------------------------------------------------------------------------- Multi Wound Chart Details Patient Name: Date of Service: [name], [name] 08/21/2019 11:15 AM Medical Record [id number] Patient Account Number: [id number] Date of Birth/Sex: Treating RN: 1946/03/13 59(72 y.o. Harlon FlorM) Deaton, Millard.LoaBobbi Primary Care Tsuyako Jolley: Selinda FlavinHoward, Kevin Other Clinician: Referring Earnesteen Birnie: Treating Messiah Ahr/Extender:[name], Peter CongoMichael Howard, Wadie LessenKevin Weeks in Treatment: 3 Vital Signs Height(in): Capillary Blood 90 Glucose(mg/dl): Weight(lbs): Pulse(bpm): 79 Body Mass Index(BMI): Blood Pressure(mmHg): 101/53 Temperature(F): 98.2 Respiratory 16 Rate(breaths/min): Photos: [5:No Photos] [N/A:N/A] Wound Location: [5:Left Metatarsal head  fifth N/A] Wounding Event: [5:Gradually Appeared] [N/A:N/A] Primary Etiology: [5:Diabetic Wound/Ulcer of the N/A Lower Extremity] Comorbid History: [5:Cataracts, Chronic sinus N/A problems/congestion, Coronary Artery Disease, Hypertension, Peripheral Arterial Disease, Type II Diabetes, Osteoarthritis, Neuropathy] Date Acquired: [5:07/14/2019] [N/A:N/A] Weeks of Treatment: [5:3] [N/A:N/A] Wound Status: [5:Open] [N/A:N/A] Measurements L x W x D 1x1.5x0.1 [N/A:N/A] (cm) Area (cm) : [5:1.178] [N/A:N/A] Volume (cm) : [5:0.118] [N/A:N/A] % Reduction in Area: [5:-1769.80%] [N/A:N/A] % Reduction in Volume: -807.70% [N/A:N/A] Classification: [5:Grade 1] [N/A:N/A] Exudate Amount: [5:Small] [N/A:N/A] Exudate Type: [5:Serosanguineous] [N/A:N/A] Exudate Color: [5:red, brown] [N/A:N/A] Wound Margin: [5:Thickened] [N/A:N/A] Granulation Amount: [5:Large (67-100%)] [N/A:N/A] Granulation Quality: [5:Red, Pink] [N/A:N/A] Necrotic Amount: [5:None Present (0%)] [N/A:N/A] Exposed Structures: [5:Fat Layer (  Subcutaneous N/A Tissue) Exposed: Yes Fascia: No Tendon: No Muscle: No Joint: No Bone: No] Epithelialization: [5:Small (1-33%)] [N/A:N/A N/A] Treatment Notes Wound #5 (Left Metatarsal head fifth) 1. Cleanse With Wound Cleanser Soap and water 3. Primary Dressing Applied Calcium Alginate Ag 4. Secondary Dressing Dry Gauze Roll Gauze Drawtex 7. Footwear/Offloading device applied Total Contact Cast Electronic Signature(s) Signed: 08/21/2019 6:29:04 PM By: [name] MD Signed: 08/21/2019 6:35:09 PM By: Deon Pilling Entered By: [name] on 08/21/2019 18:20:51 -------------------------------------------------------------------------------- Multi-Disciplinary Care Plan Details Patient Name: Date of Service: [name], [name] 08/21/2019 11:15 AM Medical Record [id number] Patient Account Number: [id number] Date of Birth/Sex: Treating RN: September 02, 1946 (73 y.o. [name] Primary  Care Kaleeya Hancock: [name] Other Clinician: Referring Jemeka Wagler: Treating Malcomb Gangemi/Extender:[name], [name], [name] in Treatment: 3 Active Inactive Wound/Skin Impairment Nursing Diagnoses: Knowledge deficit related to ulceration/compromised skin integrity Goals: Patient/caregiver will verbalize understanding of skin care regimen Date Initiated: 07/29/2019 Target Resolution Date: 09/05/2019 Goal Status: Active Ulcer/skin breakdown will have a volume reduction of 30% by week 4 Date Initiated: 07/29/2019 Target Resolution Date: 09/05/2019 Goal Status: Active Interventions: Assess patient/caregiver ability to obtain necessary supplies Assess patient/caregiver ability to perform ulcer/skin care regimen upon admission and as needed Assess ulceration(s) every visit Notes: Electronic Signature(s) Signed: 08/21/2019 6:35:09 PM By: Deon Pilling Entered By: Deon Pilling on 08/21/2019 17:42:22 -------------------------------------------------------------------------------- Pain Assessment Details Patient Name: Date of Service: [name], [name] 08/21/2019 11:15 AM Medical Record [id number] Patient Account Number: [id number] Date of Birth/Sex: Treating RN: 04-29-1946 (73 y.o. [name] Primary Care Malaquias Lenker: [name] Other Clinician: Referring Lemma Tetro: Treating Yarissa Reining/Extender:[name], [name], [name] in Treatment: 3 Active Problems Location of Pain Severity and Description of Pain Patient Has Paino No Site Locations Pain Management and Medication Current Pain Management: Electronic Signature(s) Signed: 08/22/2019 5:59:58 PM By: Levan Hurst RN, BSN Entered By: Levan Hurst on 08/21/2019 11:09:20 -------------------------------------------------------------------------------- Patient/Caregiver Education Details Patient Name: [name] 10/22/2020andnbsp11:15 Date of Service: AM Medical Record [id number] Number: Patient Account  Number: [id number] Treating RN: Dec 27, 1945 (72 y.o. Deon Pilling Date of Birth/Gender: M) Other Clinician: Primary Care Physician:Howard, Randell Loop Referring Physician: Physician/Extender: Darolyn Rua in Treatment: 3 Education Assessment Education Provided To: Patient Education Topics Provided Wound/Skin Impairment: Handouts: Caring for Your Ulcer Methods: Explain/Verbal Responses: Reinforcements needed Electronic Signature(s) Signed: 08/21/2019 6:35:09 PM By: Deon Pilling Entered By: Deon Pilling on 08/21/2019 17:42:38 -------------------------------------------------------------------------------- Wound Assessment Details Patient Name: Date of Service: WILLI, BOROWIAK 08/21/2019 11:15 AM Medical Record [id number] Patient Account Number: [id number] Date of Birth/Sex: Treating RN: [birth year] (73 y.o. [name] Primary Care Pammy Vesey: [name] Other Clinician: Referring Maeci Kalbfleisch: Treating Stellan Vick/Extender:[name], [name], [name] in Treatment: 3 Wound Status Wound Number: 5 Primary Diabetic Wound/Ulcer of the Lower Extremity Etiology: Wound Location: Left Metatarsal head fifth Wound Open Wounding Event: Gradually Appeared Status: Date Acquired: 07/14/2019 Comorbid Cataracts, Chronic sinus problems/congestion, Weeks Of Treatment: 3 History: Coronary Artery Disease, Hypertension, Clustered Wound: No Peripheral Arterial Disease, Type II Diabetes, Osteoarthritis, Neuropathy Wound Measurements Length: (cm) 1 Width: (cm) 1.5 Depth: (cm) 0.1 Area: (cm) 1.178 Volume: (cm) 0.118 Wound Description Classification: Grade 1 Wound Margin: Thickened Exudate Amount: Small Exudate Type: Serosanguineous Exudate Color: red, brown Wound Bed Granulation Amount: Large (67-100%) Granulation Quality: Red, Pink Necrotic Amount: None Present (0%) r After Cleansing: No ibrino No Exposed Structure posed:  No (Subcutaneous Tissue) Exposed: Yes posed: No sed: No ed: No d: No % Reduction in Area: -1769.8% % Reduction in Volume: -807.7% Epithelialization: Small (1-33%) Tunneling:  No Undermining: No Foul Odo Slough/F Fascia Ex Fat Layer Tendon Ex Muscle Expo Joint Expos Bone Expose Treatment Notes Wound #5 (Left Metatarsal head fifth) 1. Cleanse With Wound Cleanser Soap and water 3. Primary Dressing Applied Calcium Alginate Ag 4. Secondary Dressing Dry Gauze Roll Gauze Drawtex 7. Footwear/Offloading device applied Total Contact Cast Electronic Signature(s) Signed: 08/22/2019 5:59:58 PM By: Zandra Abts RN, BSN Entered By: Zandra Abts on 08/21/2019 17:28:57 -------------------------------------------------------------------------------- Vitals Details Patient Name: Date of Service: ADOM, SCHOENECK 08/21/2019 11:15 AM Medical Record [id number] Patient Account Number: [id number] Date of Birth/Sex: Treating RN: 09/19/1946 (73 y.o. [name] Primary Care Lior Hoen: [name] Other Clinician: Referring Junelle Hashemi: Treating Jenah Vanasten/Extender:[name], [name], [name] in Treatment: 3 Vital Signs Time Taken: 11:11 Temperature (F): 98.2 Pulse (bpm): 79 Respiratory Rate (breaths/min): 16 Blood Pressure (mmHg): 101/53 Capillary Blood Glucose (mg/dl): 90 Reference Range: 80 - 120 mg / dl Notes glucose per pt report Electronic Signature(s) Signed: 08/22/2019 5:59:58 PM By: Zandra Abts RN, BSN Entered By: Zandra Abts on 08/21/2019 11:11:40

## 2019-08-22 NOTE — Progress Notes (Signed)
KAIL, FRALEY ([id number]) Visit Report for 08/21/2019 HPI Details Patient Name: Date of Service: ZAKEE, DEERMAN 08/21/2019 11:15 AM Medical Record [id number] Patient Account Number: [id number] Date of Birth/Sex: 11-04-1945 (73 y.o. M) Treating RN: Deon Pilling Primary Care Provider: [name] Other Clinician: Referring Provider: Treating Provider/Extender:[name], [name], [name] in Treatment: 3 History of Present Illness HPI Description: 05/18/16; this is a 73year-old diabetic who is a type II diabetic on insulin. The history is that he traumatized his right foot developed a sore sometime in late March. Shortly thereafter he went on a cruise but he had to get off the cruise ship in [place] and fly urgently back to [place] where he was admitted to [facility] and ultimately underwent a transmetatarsal amputation by Dr. 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 According to the patient and his wife this wound never really healed. He was seen on 2 occasions in the wound care center in [place] and had vascular studies and then was referred urgently to Dr. Bridgett Larsson of vascular surgery. He underwent an angiogram on 05/10/16. Unfortunately nothing really could be done to improve his vascular status. He had a 75-90% stenosis in the midsegment of 1 segment of the posterior femoral artery. He had a patent popliteal, his anterior tibial occluded shortly after takeoff. Perineal had a greater than 90% stenosis posterior tibial is occluded feet had no distal collaterals feed distal aspect of the transmetatarsal amputation site. The patient tells me that he had a prolonged period of Santyl by Dr. Doran Durand was some initial improvement but then this was stopped. I think they're only applying daily dressings/dry dressings. He has not had a recent x-ray of the right foot he did have one before his surgery in April. His wife by the dimensions of the wound/surgical site being followed at  home since 4/20. At that point the dimensions were 0.5 x 12 x 0.2 on 7/19 this was 1.8 x 6 x 0.4. He is not currently on any antibiotics. His hemoglobin A1c in early April was 12.9 at that point he was started on insulin. Apparently his blood sugars are much lower he has an appointment with Dr. Legrand Como Alteimer of endocrine next week. 05/29/16 x-ray of the area did not show osteomyelitis. I think he probably needs an MRI at this point. His wife is asking about something called"Yireh" cream which is not FDA approved. I have not heard of this. 06/22/16; MRI did not really suggest osteomyelitis. There was minimal marrow edema and enhancement in the stump of the second metatarsal felt to be secondary likely to postoperative change rather than osteomyelitis. The patient has arranged his own consultation with Dr. Andree Elk at [place], apparently their daughter lives in [place] and has some connection here. Any improvement in vascular supply by Dr. Andree Elk would of course be helpful. Dr. Bridgett Larsson did not feel that anything further could be done other than amputation if wound care did not result in healing or if the area deteriorates. 06/26/16; the patient has been to see Dr. Andree Elk at [place] and had an angiogram. He is going for a procedure on Thursday which will involve catheterization. I'm not sure if this is an anterograde or retrograde approach. He has been using Santyl to the wound 07/10/16; the patient had a repeat angiogram and angioplasty at [facility] by Dr. Brunetta Jeans. His angiogram showed right CFA and profundal widely patent. The right as of a.m. popliteal artery were widely patent the right anterior tibial was occluded proximally and reconstitutes at  the ankle. Peroneal artery was patent to the foot. Posterior tibial artery was occluded. The patient had angioplasty of the anterior tibial artery.. This was quite successful. He was recommended for Plavix as well as aspirin. 07/17/16; the patient was close to be a nurse  visit today however the outer dressing of the Apligraf fell off. Noted drainage. I was asked to see the wound. The patient is noted an odor however his wife had noted that. Drainage with Apligraf not necessarily a bad thing. He has not been systemically unwell 07/24/16; we are still have an issue with drainage of this wound. In spite of this I applied his second Apligraf. Medially the area still is probing to bone. 08/07/16; Apligraf reapplied in general wound looks improved. 08/21/16 Apligraf #4. Wound looks much better 09/04/16 patientt's wound again today continues to appear to improve with the application of the Apligraf's. He notes no increased discomfort or concerns at this point in time. 09/18/16; the patient returns today 2 weeks after his fifth application of Apligraf. Predictably three quarters of the width of this wound has healed. The deep area that probe to bone medially is still open. The patient asked how much out-of-pocket dollars would be for additional Apligraf's. 09/25/16; now using Hydrofera Blue. He has completed 5 Apligraf applications with considerable improvement in this deep open transmetatarsal amputation site. His wound is now a triangular-shaped wound on the medial aspect. At roughly 12 to 2:00 this probes another centimeter but as opposed to in the past this does not probe to bone. The patient has been seen at [place] by Dr. Zenia Resides. He is not planning to do any more revascularization unless the wound stalls or worsens per the patient 10/02/16; 0.7 x 0.8 x 0.8. Unfortunately although the wound looks stable to improved. There is now easily probable bone. This hasn't been present for several weeks. Patient is not otherwise symptomatic he is not experiencing any pain. I did a culture of the wound bed 10/09/16. Deterioration last week. Culture grew MRSA and although there is improvement here with doxycycline prescribed over the phone I'm going to try to get him linezolid 600 twice  a day for 10 days today. 10/16/16; he is completing a weeks worth of linezolid and still has 3 more days to go. Small triangular-shaped open area with some degree of undermining. There is still palpable bone with a curet. Overall the area appears better than last week 10/20/16 he has completed the linezolid still having some nausea and vomiting but no diarrhea. He has exposed bone this week which is a deterioration. 10/27/16 patient now has a small but probing wound down to bone. Culture of this bone that I did last week showed a few methicillin-resistant staph aureus. I have little doubt that this represents acute/subacute osteomyelitis. The patient is currently on Doxy which I will continue he also completed 10 days of linezolid. We are now in a difficult situation with this patient's foot after considerable discussion we will send him back to see Dr. Doran Durand for a surgical opinion of this I'm also going to try to arrange a infectious disease consult at [facility] hopefully week and get this prior to her usual 4-6 weeks we having [place]. The patient clearly is going to need 6 weeks of IV vancomycin. If we cannot arrange this expediently I'll have to consider ordering this myself through a home infusion company 11/03/16; the patient now has a small in terms of circumference but probing wound. No bone palpable today. The  patient remains on doxycycline 100 twice a day which should support him until he sees infectious disease at [facility] next week the following week on Wednesday I believe he has an appointment with Dr. Andree Elk at [facility] who is his vascular cardiologist. Finally he has an appointment with Dr. Doran Durand on 11/22/16 we have been using silver alginate. The patient's wife states they are having trouble getting this through [facility] 11/13/16; the patient was seen by infectious disease at [facility] in the 11th PICC line placed in preparation for IV antibiotics. A tummy he has not going to get IV  vancomycin o Ceftaroline. They've also ordered an MRI. Patient has a follow-up with Dr. Doran Durand on 11/22/16 and Dr. Andree Elk at [facility] tomorrow 11/23/16 the patient is on daptomycin as directed by infectious disease at [facility]. He is also been back to see Dr. Andree Elk at [facility]. He underwent a repeat arteriogram. He had a successful PTA of the right anterior tibial artery. He is on dual antiplatelete treatment with Plavix and aspirin. Finally he had the MRI of his foot in [place]. This showed cellulitis about the foot worse distally edema and enhancement in the reminiscent of the second metatarsal was consistent with osteomyelitis therefore what I was assuming to be the first metatarsal may be actually the second. He also has a fluid collection deep to the calcaneus at the level of the calcaneal spur which could be an abscess or due to adventitial bursitis. He had a small tear in his Achilles 11/30/16; the patient continues on daptomycin as directed by infectious disease at [facility]. He is been revascularized by Dr. Andree Elk at [facility] in [place]. He has been to see Gretta Arab who was the orthopedic surgeon who did his original amputation. I have not seen his not however per the patient's wife he did not offer another surgical local surgical prodecure to remove involved bone. He verbalized his usual disbelief in not just hyperbarics but any medical therapy for this condition(osteomyelitis). I discussed this in detail with the patient today including answering the question about a BKA definitively "curing" the current condition. 12/07/16; the patient continues on daptomycin as directed by infectious disease at [facility]. This is directed at the MRSA that we cultured from his bone debridement from 12/22. Lab work today shows a white count of 8.7 hemoglobin of 10.5 which is microcytic and hypochromic differential count shows a slightly elevated monocyte count at 1.2 eosinophilic count of 0.6. His  creatinine is 1.11 sedimentation rate apparently is gone from 35-34 now 40. I explained was wife I don't think this represents a trend. His total CK is 48 12/14/16- patient is here for follow-up evaluation of his right TMA site. He continues to receive IV daptomycin per infectious disease. His serum inflammatory markers remain elevated. He complains of intermittent pain to the medial aspect of the TMA site with intermittent erythema. He voices no complaints or concerns regarding hyperbaric therapy. Overall he and his wife are expressing a frustration and discouragement regarrding the length of time of treatment. 12/21/16; small open wound at roughly the first or second metatarsal metatarsalphalyngeal joint reminiscence of his transmetatarsal amputation site he continues to receive IV daptomycin per infectious disease at [facility]. He will finish these a week tomorrow. He has lab work which I been copied on. His white count is 10.8 hemoglobin 8.9 MCV is low at 75., MCH low at 24.5 platelet count slightly elevated at 626. Differential count shows 70% neutrophils 10% monocytes and 10% eosinophils. His  comprehensive metabolic panel shows a slightly low sodium at 133 albumin low at 3.1 total CK is normal at 42 sedimentation rate is much higher at 82. He has had iron studies that show a serum iron of 15 and iron binding capacity of 238 and iron saturation of 6. This is suggestive of iron deficiency. B12 and folate were normal ferritin at 113 The patient tells me that he is not eating well and he has lost weight. He feels episodically nauseated. He is coughing and gagging on mucus which she thinks is sinusitis. He has an appointment with his primary doctor at 5:00 this afternoon in [facility] 01/02/17; the patient developed a subacute pneumonitis. He was admitted to [facility] after a CT scan showed an extensive interstitial pneumonitis [I have not yet seen this]. 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 He has since been discharged. He is not on oxygen. He feels fatigued and very short of breath with exertion. At [facility] the wound care nurse there felt that his wound was healed. He did complete his daptomycin and has follow-up with infectious disease on Friday. X-rays I did before he went to [facility] still suggested residual osteomyelitis in the anterior aspect of the second must metatarsal head. I'm not sure I would've expected any different. There was no other findings. I actually think I did this because of erythema over the first metatarsal head reminiscent 01/11/17; the patient has eosinophilic pneumonitis. He has been reviewed by pulmonology and given clearance for hyperbarics at least that's what his wife says. I'll need to see if there is note in care everywhere. Apparently the prognosis for improvement of daptomycin induced eosinophilic granulocyte is is 3 months without steroids. In the meantime infectious disease has placed him on doxycycline until the wound is closed. He is still is lost a lot of weight and his blood sugars are running in the mid 60s to low 80s fasting and at all times during the day 01/18/17; he has had adjustments in his insulin apparently his blood sugars in the morning or over 100. He wants to restart his hyperbaric treatment we'll do this at 1:00. 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 I think there is good reason to complete his treatments in order to give him the best chance of maintaining a healed status and these DFU 3 wounds with MRSA infection in the bone 02/15/17; the patient was seen today in conjunction with HBO. He completed hyperbaric oxygen today. The open area on his transmetatarsal site has remained closed. There was an area of erythema when I saw him  earlier in the week on the posterior heel although that is resolved as of today as well. He has been using a cam walker. This is a patient who came to [place] after a transmetatarsal amputation that was necrotic and dehisced. He required revascularization percutaneously on 2 different occasions by Dr. Andree Elk of invasive cardiology at [facility]. 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 He went to [facility] infectious disease and had IV daptomycin for 5 weeks before developing eosinophilic pneumonitis and requiring an admission to hospital/ICU. He made a good recovery and is continued on doxycycline since. As mentioned his foot is closed now. He has a follow-up with Dr. Andree Elk tomorrow. He is going  to [employer] on Monday for a custom-made shoe READMISSION Last visit Dr. Andree Elk [facility] 02/16/17 1. Critical limb ischemia of the RLE: s/p transmetatarsal amputation of the RLE, now completely healed. s/p right ATA percutaneous revascularization procedure x2, most recently 11/20/2016 s/p PTA of right AT 100% to less than 20% with a 2.5 x 200 balloon. He does have some residual osteomyelitis, but given the wound is closed, the orthopedist has recommended to follow. He has a fitting for a special shoe coming up next week. He has completed a course of daptomycin and doxycycline and he has been discharged by ID. He has completed a course of hyperbaric therapy. Continue Continue medical management with aspirin, Plavix and statin therapy. We will discuss ongoing Plavix therapy at follow-up in 6 months. 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 Given no symptoms, we will conservatively manage. He doesn't want to do any more invasive studies at this time, which is reasonable. The patient  arrives today out of 2 concerns both on the right transmetatarsal site. 1 at the level of the reminiscent fifth metatarsal head and the other at roughly the first or second. Both of these look like dark subcutaneous discoloration probably subdermal bleeding. He is recently obtained new adaptive footwear for the right foot. He also has a callus on the left fifth dorsal toe however he follows with podiatry for this and I don't think this is any issue. ABIs in this clinic today were 0.66 on the right and 1.06 on the left. On entrance into our clinic initially this was 0.95 and 0.92. He does not describe current claudication. They're going away on a cruise in 8 weeks and I think are trying to do the month is much as they can proactively. They follow with podiatry and have an appointment with Dr. Andree Elk in October READMISSION 06/25/18 This is a patient that we have not seen in almost a year. He is a type II diabetic with known PAD. He is followed by Dr. Andree Elk of interventional cardiology at [facility] in [place]. He is required revascularization for significant PAD. When we first saw him he required a transmetatarsal amputation. He had underlying osteomyelitis with a nonhealing surgical wound. This eventually closed with wound care, IV antibiotics and hyperbaric oxygen. They tell me that he has a modified shoe and he is very active walking up to 4 miles a day. He is followed by Dr. Geroge Baseman of podiatry. His wife states that he underwent a removal of callus over this site on July 9. She felt there may be drainage from this site after that although she could never really determined and there was callus buildup again. On 06/01/18 there was pressure bleeding through the overlying callus. he was given a prescription for 7 days of Bactrim. Fortuitously he has an appointment with Dr. Andree Elk on 06/04/18 and he immediately underwent revascularization of the right leg although I have not had a chance to review these  records in care everywhere. This was apparently done through anterior and retrograde access. On 06/06/18 he had another debridement by Dr. Bernette Mayers. Vitamin soaking with Epsom salts for 20 minutes and applying calcium alginate. The original trans-met was on April 2017 I believe by Dr. Doran Durand. His ABI in our clinic was noncompressible today. 07/02/18; x-ray I ordered last week was negative for osteomyelitis. Swab culture was also negative. He is going to  require an MRI which I have ordered today. 07/09/18; surprisingly the MRI of the foot that I ordered did not show osteomyelitis. He did suggest the possibility of cellulitis. For this reason I'll go ahead and give him a 10 day course of doxycycline. Although the previous culture of this area was negative 07/16/18; it arrives with the wound looking much the same. Roughly the same depth. He has thick subcutaneous tissue around the wound orifice but this still has roughly the same depth. Been using silver alginate. We applied Oasis #1 today 07/23/2018; still having to remove a lot of callus and thick subcutaneous tissue to actually define the wound here. Most of this seems to have closed down yet he has a comma shaped divot over the top of the area that still I think is open. We applied Oasis #2 His wife expressed concern about the tip of his left great toe. This almost looks like a small blister. She also showed it today to her podiatrist Dr. Geroge Baseman who did not think this was anything serious. I am not sure is anything serious either however I think it bears some watching. He is not in any pain however he is insensate 07/30/2018;; still on a lot of nonviable tissue over the surface of the wound however cleaning this up reveals a more substantial wound orifice but less of a probing wound depth. This is not probed to bone. There is no evidence of infection The wife is still concerned about a non-open area on the tip of his left great toe. Almost feels like a  bony outgrowth. She had previously showed this to podiatry. I do not think this is a blister. A friction area would be possible although he is really not walking according to his wife. He had a small skin tag on his right buttock but no open wound here either 08/06/2018; we applied a third Oasis last week. Unfortunately there is really no improvement. Still requiring extensive debridement to expose the wound bed from a horizontal slitlike depression. I still have not been able to get this to fill in properly. On the positive side there is now no probable bone from when he first came into the facility. I changed him to silver alginate today after a reasonably aggressive debridement 08/13/2018; once again the patient comes in with skin and subcutaneous tissue closing over the small probing area with the underlying cavity of the wound on the right TMA site. I applied silver alginate to this last week. Prior to that we used Oasis x3 still not able to get this area granulating. He does not have a probing area of the bone which is an improvement from when I spur started working on this and an MRI did not suggest osteomyelitis. I do not see evidence of infection here but I am increasingly concerned about why I cannot get this area to granulate. Each time I debrided this is looks like this is simply a matter of getting granulation to fill in the hole and then getting epithelialization. This does not seem to happen Also I sent him back to see podiatry Dr. Earleen Newport about the what felt to be bony outgrowth on the tip of his left great toe. Apparently after heel he left the clinic last week or the next day he developed a blood blister. He did see Dr. Earleen Newport. He went on to have a debridement of the medial nail cuticle he now has an open area here as well as some denuded skin. They  did an x-ray apparently does have a bony outgrowth or spur but I am not able to look at this. They are using topical antibiotics  apparently there was some suggested he use Santyl. Patient's wife was anxious for my opinion of this 08/20/2018; we are able to keep the wound open this time instead of the thick subcutaneous tissue closing over the top of it however unfortunately once again this probes to bone. I do not see any evidence of infection and previous MRI did not show osteomyelitis. I elected to go back to the Oasis to see if we can stimulate some granulation. Last saw his vascular interventional cardiologist Dr. Andree Elk at the beginning of August and he had a repeat procedure. Nevertheless I wonder how much blood flow he has down to this area With regards to the left first toe he is seeing Dr. Earleen Newport next week. They are applying Bactroban to this area. 08/27/2018 Once again he comes in with thick eschar and subcutaneous tissue over the top of the small probing hole. This does not appear to go down to bone but it still has roughly the same depth. I reapplied Oasis today Over the left great toe there appears to be more of the wound at the tip of his toe than there was last week. This has an eschar on the surface of it. Will change to Santyl. There is seeing podiatry this afternoon 09/03/2018 He comes in today with the area on the transmetatarsal's site with a fair amount of callus, nonviable tissue over the circumference but it was not closed. I removed all of this as well as some subcutaneous debris and reapplied Oasis. There is no exposed bone The area over the tip of the left great toe started off as a nodule of uncertain etiology. He has been followed with podiatry. They have been removing part of the medial nail bed. He has nonviable tissue over the wound he been using Santyl in this area 09/10/18 Unfortunately comes in with neither wound area looking improved. The transmetatarsal amputation site once Again has nonviable debris over the surface requiring debridement. Unfortunately underneath this there is  nothing that looks viable and this once again goes right down to bone. There is no purulent drainage and no erythema. Also the surgical wound from podiatry on the left first toe has an ischemic-looking eschar over the surface of the tip of the toe I have elected not to attempt candidly debride this His wife as arranged for him to have follow-up noninvasive studies in [place] under the care of Dr. Andree Elk clinic. The question is he has known severe PAD. They had recently seen him in August. He has had revascularizations in both legs within the last 4 or 5 months. He is not complaining of pain and I cannot really get a history of claudication. He has not systemically unwell 09/20/2018 the patient has been to [facility] and been revascularized by Dr. Andree Elk earlier this week. Apparently he was able to open up the anterior tibial artery although I have not actually seen his formal report. He is going for an attempt to revascularize on the left on Monday. He is apparently working with an investigational stent for lower extremity arteries below the knee and he has talked to the patient about placing that on Monday if possible. The patient has been using silver alginate on the transmetatarsal amputation site and Santyl on the left 09/30/2018; patient had his revascularization on the left this apparently included a standard approach  as well as a more distal arterial catheterization although I do not have any information on this from Dr. Andree Elk. In fact I do not even see the initial revascularization that he had on the right. I have included the arterial history from Dr. Andree Elk last note however below; ASSESSMENT/PLAN: 1. Hx of Critical limb ischemia bilateral lower extremities, PAD: -s/p transmetatarsal amputation of the RLE. -s/p right ATA percutaneous revascularization procedure x2, most recently 11/20/2016 s/p PTA of right AT 100% to less than 20% with a 2.5 x 200 balloon. -On original angiogram  05/15/2016, he had a significant 70-95% left popliteal artery stenosis with AT and PT artery occlusions and one vessel runoff via the peroneal artery. -03/21/2018 s/p PTA of 90% left popliteal artery to <10% with a 5x20 cutting balloon, PTA of 90% left peroneal to <20% with a 3x20 balloon, PTA of 100% left AT to <20% with a 2.5x220 balloon. 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 Will plan on the LLE soon after. Risks/benefits of procedure have been discussed and patient has elected to proceed. We have been using endoform to the right TMA amputation site wound and Santyl to the left great toe 10/07/2018; the area on the tip of his left great toe looked better we have been using Santyl here. We continue to have a very difficult probing hole on the right TMA amputation site we have been using endoform. I went on to use his fifth Oasis today 10/14/2018; the tip of the left great toe continues to look better. We have been using Santyl here the surface however is healthy and I think we can change to an alginate. The right TMA has not changed. Once again he has no superficial opening there is callus and thick subcutaneous tissue over the orifice once you remove this there is the probing area that we have been dealing with without too much change. There is no palpable bone I have been placing Oasis here and put Oasis #6 in this today after a more vigorous debridement 10/21/18; the left great toe still has necrotic surface requiring debridement. The right TMA site hasn't changed in view of the thick callus over the wound bed. With removal of this there is still the opening however this does not appear to have the same depth. Again there is no palpable bone. Oasis was replaced 10/28/2018; patient comes in with both wounds looking worse. The area over the  first toe tip is now down to bone. The area over the TMA site is deeper and down to bone clearly with a increase in overall wound area. Equally concerning on the right TMA is the complete absence of a pulse this week which is a change. We are not even able to Doppler this. On the right he has a noncompressible ABI greater than 1.4 11/04/2018. Both wounds look somewhat worse. X-rays showed no osteomyelitis of the right foot but on the left there was underlying osteomyelitis in the left great toe distal phalanx. I been on the phone to Dr. Andree Elk surface at [facility] in [place] and they are arranging for another angiogram on the right on Monday. I have him on doxycycline for the osteomyelitis in the left great toe for now. Infectious disease may be necessary 1/17; 2-week hiatus. Patient was admitted to hospital at [place]. My understanding is he underwent an angioplasty of  the right anterior tibial artery and had stents placed in the left anterior artery and the left tibial peroneal trunk. This was done by Dr. Andree Elk of interventional radiology. There is no major change in either 1 of the wounds. They have been using Aquacel Ag. As far as they are aware no imaging studies were done of the foot which is indeed unfortunate. I had him on doxycycline for 2 weeks since we identified the osteomyelitis in the left great toe by plain x-ray. I have renewed that again today. I am still suspicious about osteomyelitis in the amputation site and would consider doing another MRI to compare with the one done in September. The idea of hyperbaric oxygen certainly comes up for discussion 1/24; no major change in either wound area. I have him on doxycycline for osteomyelitis at the tip of the left great toe. As noted he has been previously and recently revascularized by Dr. Andree Elk at [place]. He is tolerating the doxycycline well. For some reason we do not have an infectious disease consult yet. Culture of drainage from  the right foot site last week was negative 1/31; MRI of the right foot did not show osteomyelitis of the right ankle and foot. Notable for a skin ulceration overlying the second metatarsal stump with generalizing soft tissue edema of the ankle and foot consistent with cellulitis. Noted to have a partial-thickness tear of the Achilles tendon 7.5 cm proximal to the insertion Nothing really new in terms of symptoms. Patient's area on the tip of the left great toe is just about closed although he still has the probing area on the metatarsal amputation site. Appointment with Dr. Linus Salmons of infectious disease next week 2/7; Dr. Novella Olive did not feel that any further antibiotics were necessary he would follow-up in 2 months. The left great toe appears to be closed still some surface callus that I gently looked under high did not see anything open or anything that was threatening to be open. He still has the open area on the mid part of his TMA site using endoform 2/14; left great toe is closed and he is completing his doxycycline as of last Sunday. He is not on any antibiotics. Unfortunately out of the right foot his wife noticed some subdermal hemorrhage this week. He had been walking 2 miles I had given him permission to do so this is not really a plantar wound. Using endoform to this wound but I changed to silver alginate this week 2/21; left great toe remains closed. Culture last week grew Streptococcus angiosis which I am not really familiar with however it is penicillin sensitive and I am going to put him on Augmentin. Not much change in the wound on the right foot the deep area is still probing precariously close to bone and the wound on the margin of the TMA is larger. 2/28; left great toe remains closed. He is completing the Augmentin I gave him last week. 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 This is being shown to Dr. Andree Elk at [place] to see if there  is anything else that can be done here. He has been using silver alginate strips on the right 3/6; left great toe remains closed. He sees Dr. Andree Elk on Monday. The area on the plantar aspect of the right foot has the same small orifice with thick callused tissue around this. However this time with removal of the callus tissue the wound is open all the way along the  incision line to the end medially. We have been using silver alginate. 3/13; left toe remains closed although the area is callused. He sees Dr. Jacqualyn Posey of podiatry next week. The area on the plantar right foot looked a lot better this week. Culture I did of this was negative we use silver alginate. He is going next week for an attempt at revascularization by Dr. Andree Elk 3/23; left toe remains closed although the area is callused. He will see Dr. Jacqualyn Posey in follow-up. The area on the right plantar foot continues to look surprisingly better over the last 3 visits. We have been using silver alginate. The revascularization he was supposed to have by Dr. Andree Elk at [facility] in [place] has been canceled [facility] procedure] 4/6; the right foot remains closed albeit callused. Podiatry canceled the appointment with regards to the left great toe. He has not seen Dr. Andree Elk at [facility] but thinks that Dr. Andree Elk has "done all he can do". He would be a candidate for the hemostaemix trial Readmission 07/29/2019 Mr. Stann Mainland is a man we know well from at least 3 previous stays in this clinic. He is a type II diabetic with severe PAD followed by Dr. Andree Elk at [facility] in [place]. 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 So far everything in both of these areas has remained closed. About 2 weeks ago he went to see his podiat[facility]. Babs Bertin. He had a thick callus on the left fifth metatarsal head that was  shaved. He has developed an open wound in this area. They have been offloading this in his diabetic shoes. The patient has not had any more revascularizations by Dr. Andree Elk since the last time he was here. ABI in our clinic at the posterior tibial on the left was 1.06 10/6; no real change in the area on the plantar met head. Small wound with 2 mm of depth. He has thick skin probably from pressure around the wound. We have been using silver alginate 10/13; small wound in the left fifth plantar met head. Arrives today with undermining laterally and purulent drainage. Our intake nurse cultured this. His wife stated they noticed a change in color over the last day or 2. He is not systemically unwell 10/19; small wound on the fifth plantar metatarsal head. Culture I did last week showed Staphylococcus lugdunensis. Although this could be a skin contaminant the possibility of a skin and soft tissue infection was there. I did give him empiric doxycycline which should have covered this. We are using silver alginate to the wound. We put him in a total contact cast today. 10/22; small wound on the fifth plantar metatarsal head. He has completed antibiotics. He also has severe PAD which worries me about just about any wound on this man's foot Electronic Signature(s) Signed: 08/21/2019 6:29:04 PM By: [name] MD Entered By: [name] on 08/21/2019 18:21:48 -------------------------------------------------------------------------------- Physical Exam Details Patient Name: Date of Service: KIMMIE, DOREN 08/21/2019 11:15 AM Medical Record [id number] Patient Account Number: [id number] Date of Birth/Sex: May 13, 1946 (73 y.o. M) Treating RN: Deon Pilling Primary Care Provider: [name] Other Clinician: Referring Provider: Treating Provider/Extender:[name], [name], [name] in Treatment: 3 Notes Wound exam; no difference in the wound so far. No purulent drainage or  erythema. I did not do a debridement although if this is  not progressed that may need to be done. It did not have a healthy looking surface. Electronic Signature(s) Signed: 08/21/2019 6:29:04 PM By: [name] MD Entered By: [name] on 08/21/2019 18:22:20 -------------------------------------------------------------------------------- Physician Orders Details Patient Name: Date of Service: TEODORO, JEFFREYS 08/21/2019 11:15 AM Medical Record [id number] Patient Account Number: [id number] Date of Birth/Sex: 1946-03-28 (72 y.o. M) Treating RN: Levan Hurst Primary Care Provider: [name] Other Clinician: Referring Provider: Treating Provider/Extender:[name], [name], [name] in Treatment: 3 Verbal / Phone Orders: No Diagnosis Coding ICD-10 Coding Code Description E11.621 Type 2 diabetes mellitus with foot ulcer E11.51 Type 2 diabetes mellitus with diabetic peripheral angiopathy without gangrene L97.521 Non-pressure chronic ulcer of other part of left foot limited to breakdown of skin L03.116 Cellulitis of left lower limb Follow-up Appointments Return Appointment in 1 week. Dressing Change Frequency Wound #5 Left Metatarsal head fifth Do not change entire dressing for one week. Wound Cleansing Wound #5 Left Metatarsal head fifth May shower with protection. Primary Wound Dressing Wound #5 Left Metatarsal head fifth Calcium Alginate with Silver Secondary Dressing Wound #5 Left Metatarsal head fifth Foam Dry Gauze Drawtex Off-Loading Total Contact Cast to Left Lower Extremity Electronic Signature(s) Signed: 08/21/2019 6:29:04 PM By: [name] MD Signed: 08/22/2019 5:59:58 PM By: Levan Hurst RN, BSN Entered By: Levan Hurst on 08/21/2019 11:24:14 -------------------------------------------------------------------------------- Problem List Details Patient Name: Date of Service: MICHEAL, MURAD 08/21/2019 11:15 AM Medical Record  [id number] Patient Account Number: [id number] Date of Birth/Sex: 11/11/1945 (74 y.o. M) Treating RN: Levan Hurst Primary Care Provider: [name] Other Clinician: Referring Provider: Treating Provider/Extender:[name], [name], [name] in Treatment: 3 Active Problems ICD-10 Evaluated Encounter Code Description Active Date Today Diagnosis E11.621 Type 2 diabetes mellitus with foot ulcer 07/29/2019 No Yes E11.51 Type 2 diabetes mellitus with diabetic peripheral 07/29/2019 No Yes angiopathy without gangrene L97.521 Non-pressure chronic ulcer of other part of left foot 07/29/2019 No Yes limited to breakdown of skin L03.116 Cellulitis of left lower limb 08/12/2019 No Yes Inactive Problems Resolved Problems Electronic Signature(s) Signed: 08/21/2019 6:29:04 PM By: [name] MD Entered By: [name] on 08/21/2019 18:20:43 -------------------------------------------------------------------------------- Progress Note Details Patient Name: Date of Service: DEZMEN, ALCOCK 08/21/2019 11:15 AM Medical Record [id number] Patient Account Number: [id number] Date of Birth/Sex: [birth year] (73 y.o. M) Treating RN: Deon Pilling Primary Care Provider: [name] Other Clinician: Referring Provider: Treating Provider/Extender:[name], [name], [name] in Treatment: 3 Subjective History of Present Illness (HPI) 05/18/16; this is a 73year-old diabetic who is a type II diabetic on insulin. The history is that he traumatized his right foot developed a sore sometime in late March. Shortly thereafter he went on a cruise but he had to get off the cruise ship in [place] and fly urgently back to [place] where he was admitted to [facility] and ultimately underwent a transmetatarsal amputation by Dr. 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 According to the patient and his wife this wound never really healed. He was seen on 2 occasions in the  wound care center in [place] and had vascular studies and then was referred urgently to Dr. Bridgett Larsson of vascular surgery. He underwent an angiogram on 05/10/16. Unfortunately nothing really could be done to improve his vascular status. He had a 75-90% stenosis in the midsegment of 1 segment of the posterior femoral artery. He had a patent popliteal, his anterior tibial occluded shortly after takeoff. Perineal had a greater than 90% stenosis posterior tibial is occluded feet had no distal collaterals feed  distal aspect of the transmetatarsal amputation site. The patient tells me that he had a prolonged period of Santyl by Dr. Doran Durand was some initial improvement but then this was stopped. I think they're only applying daily dressings/dry dressings. He has not had a recent x-ray of the right foot he did have one before his surgery in April. His wife by the dimensions of the wound/surgical site being followed at home since 4/20. At that point the dimensions were 0.5 x 12 x 0.2 on 7/19 this was 1.8 x 6 x 0.4. He is not currently on any antibiotics. His hemoglobin A1c in early April was 12.9 at that point he was started on insulin. Apparently his blood sugars are much lower he has an appointment with Dr. Legrand Como Alteimer of endocrine next week. 05/29/16 x-ray of the area did not show osteomyelitis. I think he probably needs an MRI at this point. His wife is asking about something called"Yireh" cream which is not FDA approved. I have not heard of this. 06/22/16; MRI did not really suggest osteomyelitis. There was minimal marrow edema and enhancement in the stump of the second metatarsal felt to be secondary likely to postoperative change rather than osteomyelitis. The patient has arranged his own consultation with Dr. Andree Elk at [place], apparently their daughter lives in [place] and has some connection here. Any improvement in vascular supply by Dr. Andree Elk would of course be helpful. Dr. Bridgett Larsson did not feel that anything  further could be done other than amputation if wound care did not result in healing or if the area deteriorates. 06/26/16; the patient has been to see Dr. Andree Elk at [place] and had an angiogram. He is going for a procedure on Thursday which will involve catheterization. I'm not sure if this is an anterograde or retrograde approach. He has been using Santyl to the wound 07/10/16; the patient had a repeat angiogram and angioplasty at [place] by Dr. Brunetta Jeans. His angiogram showed right CFA and profundal widely patent. The right as of a.m. popliteal artery were widely patent the right anterior tibial was occluded proximally and reconstitutes at the ankle. Peroneal artery was patent to the foot. Posterior tibial artery was occluded. The patient had angioplasty of the anterior tibial artery.. This was quite successful. He was recommended for Plavix as well as aspirin. 07/17/16; the patient was close to be a nurse visit today however the outer dressing of the Apligraf fell off. Noted drainage. I was asked to see the wound. The patient is noted an odor however his wife had noted that. Drainage with Apligraf not necessarily a bad thing. He has not been systemically unwell 07/24/16; we are still have an issue with drainage of this wound. In spite of this I applied his second Apligraf. Medially the area still is probing to bone. 08/07/16; Apligraf reapplied in general wound looks improved. 08/21/16 Apligraf #4. Wound looks much better 09/04/16 patientt's wound again today continues to appear to improve with the application of the Apligraf's. He notes no increased discomfort or concerns at this point in time. 09/18/16; the patient returns today 2 weeks after his fifth application of Apligraf. Predictably three quarters of the width of this wound has healed. The deep area that probe to bone medially is still open. The patient asked how much out-of-pocket dollars would be for additional Apligraf's. 09/25/16; now using  Hydrofera Blue. He has completed 5 Apligraf applications with considerable improvement in this deep open transmetatarsal amputation site. His wound is now a triangular-shaped wound on  the medial aspect. At roughly 12 to 2:00 this probes another centimeter but as opposed to in the past this does not probe to bone. The patient has been seen at [place] by Dr. Zenia Resides. He is not planning to do any more revascularization unless the wound stalls or worsens per the patient 10/02/16; 0.7 x 0.8 x 0.8. Unfortunately although the wound looks stable to improved. There is now easily probable bone. This hasn't been present for several weeks. Patient is not otherwise symptomatic he is not experiencing any pain. I did a culture of the wound bed 10/09/16. Deterioration last week. Culture grew MRSA and although there is improvement here with doxycycline prescribed over the phone I'm going to try to get him linezolid 600 twice a day for 10 days today. 10/16/16; he is completing a weeks worth of linezolid and still has 3 more days to go. Small triangular-shaped open area with some degree of undermining. There is still palpable bone with a curet. Overall the area appears better than last week 10/20/16 he has completed the linezolid still having some nausea and vomiting but no diarrhea. He has exposed bone this week which is a deterioration. 10/27/16 patient now has a small but probing wound down to bone. Culture of this bone that I did last week showed a few methicillin-resistant staph aureus. I have little doubt that this represents acute/subacute osteomyelitis. The patient is currently on Doxy which I will continue he also completed 10 days of linezolid. We are now in a difficult situation with this patient's foot after considerable discussion we will send him back to see Dr. Doran Durand for a surgical opinion of this I'm also going to try to arrange a infectious disease consult at [facility] hopefully week and get this prior to  her usual 4-6 weeks we having [place]. The patient clearly is going to need 6 weeks of IV vancomycin. If we cannot arrange this expediently I'll have to consider ordering this myself through a home infusion company 11/03/16; the patient now has a small in terms of circumference but probing wound. No bone palpable today. The patient remains on doxycycline 100 twice a day which should support him until he sees infectious disease at [facility] next week the following week on Wednesday I believe he has an appointment with Dr. Andree Elk at [facility] who is his vascular cardiologist. Finally he has an appointment with Dr. Doran Durand on 11/22/16 we have been using silver alginate. The patient's wife states they are having trouble getting this through [facility] 11/13/16; the patient was seen by infectious disease at [facility] in the 11th PICC line placed in preparation for IV antibiotics. A tummy he has not going to get IV vancomycin o Ceftaroline. They've also ordered an MRI. Patient has a follow-up with Dr. Doran Durand on 11/22/16 and Dr. Andree Elk at [facility] tomorrow 11/23/16 the patient is on daptomycin as directed by infectious disease at [facility]. He is also been back to see Dr. Andree Elk at [facility]. He underwent a repeat arteriogram. He had a successful PTA of the right anterior tibial artery. He is on dual antiplatelete treatment with Plavix and aspirin. Finally he had the MRI of his foot in [place]. This showed cellulitis about the foot worse distally edema and enhancement in the reminiscent of the second metatarsal was consistent with osteomyelitis therefore what I was assuming to be the first metatarsal may be actually the second. He also has a fluid collection deep to the calcaneus at the level of the calcaneal spur  which could be an abscess or due to adventitial bursitis. He had a small tear in his Achilles 11/30/16; the patient continues on daptomycin as directed by infectious disease at [facility]. He is been  revascularized by Dr. Andree Elk at [facility] in [place]. He has been to see Gretta Arab who was the orthopedic surgeon who did his original amputation. I have not seen his not however per the patient's wife he did not offer another surgical local surgical prodecure to remove involved bone. He verbalized his usual disbelief in not just hyperbarics but any medical therapy for this condition(osteomyelitis). I discussed this in detail with the patient today including answering the question about a BKA definitively "curing" the current condition. 12/07/16; the patient continues on daptomycin as directed by infectious disease at [facility]. This is directed at the MRSA that we cultured from his bone debridement from 12/22. Lab work today shows a white count of 8.7 hemoglobin of 10.5 which is microcytic and hypochromic differential count shows a slightly elevated monocyte count at 1.2 eosinophilic count of 0.6. His creatinine is 1.11 sedimentation rate apparently is gone from 35-34 now 40. I explained was wife I don't think this represents a trend. His total CK is 48 12/14/16- patient is here for follow-up evaluation of his right TMA site. He continues to receive IV daptomycin per infectious disease. His serum inflammatory markers remain elevated. He complains of intermittent pain to the medial aspect of the TMA site with intermittent erythema. He voices no complaints or concerns regarding hyperbaric therapy. Overall he and his wife are expressing a frustration and discouragement regarrding the length of time of treatment. 12/21/16; small open wound at roughly the first or second metatarsal metatarsalphalyngeal joint reminiscence of his transmetatarsal amputation site he continues to receive IV daptomycin per infectious disease at [facility]. He will finish these a week tomorrow. He has lab work which I been copied on. His white count is 10.8 hemoglobin 8.9 MCV is low at 75., MCH low at 24.5 platelet count slightly  elevated at 626. Differential count shows 70% neutrophils 10% monocytes and 10% eosinophils. His comprehensive metabolic panel shows a slightly low sodium at 133 albumin low at 3.1 total CK is normal at 42 sedimentation rate is much higher at 82. He has had iron studies that show a serum iron of 15 and iron binding capacity of 238 and iron saturation of 6. This is suggestive of iron deficiency. B12 and folate were normal ferritin at 113 The patient tells me that he is not eating well and he has lost weight. He feels episodically nauseated. He is coughing and gagging on mucus which she thinks is sinusitis. He has an appointment with his primary doctor at 5:00 this afternoon in [facility] 01/02/17; the patient developed a subacute pneumonitis. He was admitted to [facility] after a CT scan showed an extensive interstitial pneumonitis [I have not yet seen this]. 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 He has since been discharged. He is not on oxygen. He feels fatigued and very short of breath with exertion. At [facility] the wound care nurse there felt that his wound was healed. He did complete his daptomycin and has follow-up with infectious disease on Friday. X-rays I did before he went to [facility] still suggested residual osteomyelitis in the anterior aspect of the second must metatarsal head. I'm not sure I would've expected any different. There was no other findings.  I actually think I did this because of erythema over the first metatarsal head reminiscent 01/11/17; the patient has eosinophilic pneumonitis. He has been reviewed by pulmonology and given clearance for hyperbarics at least that's what his wife says. I'll need to see if there is note in care everywhere. Apparently the prognosis for improvement of daptomycin induced eosinophilic granulocyte is is 3 months without steroids. In  the meantime infectious disease has placed him on doxycycline until the wound is closed. He is still is lost a lot of weight and his blood sugars are running in the mid 60s to low 80s fasting and at all times during the day 01/18/17; he has had adjustments in his insulin apparently his blood sugars in the morning or over 100. He wants to restart his hyperbaric treatment we'll do this at 1:00. 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 I think there is good reason to complete his treatments in order to give him the best chance of maintaining a healed status and these DFU 3 wounds with MRSA infection in the bone 02/15/17; the patient was seen today in conjunction with HBO. He completed hyperbaric oxygen today. The open area on his transmetatarsal site has remained closed. There was an area of erythema when I saw him earlier in the week on the posterior heel although that is resolved as of today as well. He has been using a cam walker. This is a patient who came to [place] after a transmetatarsal amputation that was necrotic and dehisced. He required revascularization percutaneously on 2 different occasions by Dr. Andree Elk of invasive cardiology at [facility]. 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 He went to [facility] infectious disease and had IV daptomycin for 5 weeks before developing eosinophilic pneumonitis and requiring an admission to hospital/ICU. He made a good recovery and is continued on doxycycline since. As mentioned his foot is closed now. He has a follow-up with Dr. Andree Elk tomorrow. He is going to [employer] on Monday for a custom-made shoe READMISSION Last visit Dr. Andree Elk [facility] 02/16/17 1. Critical limb ischemia of the RLE:  s/p transmetatarsal amputation of the RLE, now completely healed.  s/p right ATA  percutaneous revascularization procedure x2, most recently 11/20/2016 s/p PTA of right AT 100% to less than 20% with a 2.5 x 200 balloon.  He does have some residual osteomyelitis, but given the wound is closed, the orthopedist has recommended to follow. He has a fitting for a special shoe coming up next week. He has completed a course of daptomycin and doxycycline and he has been discharged by ID. He has completed a course of hyperbaric therapy.  Continue Continue medical management with aspirin, Plavix and statin therapy. We will discuss ongoing Plavix therapy at follow-up in 6 months.  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 Given no symptoms, we will conservatively manage. He doesn't want to do any more invasive studies at this time, which is reasonable. The patient arrives today out of 2 concerns both on the right transmetatarsal site. 1 at the level of the reminiscent fifth metatarsal head and the other at roughly the first or second. Both of these look like dark subcutaneous discoloration probably subdermal bleeding. He is recently obtained new adaptive footwear for  the right foot. He also has a callus on the left fifth dorsal toe however he follows with podiatry for this and I don't think this is any issue. ABIs in this clinic today were 0.66 on the right and 1.06 on the left. On entrance into our clinic initially this was 0.95 and 0.92. He does not describe current claudication. They're going away on a cruise in 8 weeks and I think are trying to do the month is much as they can proactively. They follow with podiatry and have an appointment with Dr. Andree Elk in October READMISSION 06/25/18 This is a patient that we have not seen in almost a year. He is a type II diabetic with known PAD. He is followed by Dr. Andree Elk of interventional cardiology at [facility] in [place]. He is required revascularization for significant PAD. When we first saw him he required a transmetatarsal amputation. He had underlying osteomyelitis with a nonhealing surgical wound. This eventually closed with wound care, IV antibiotics and hyperbaric oxygen. They tell me that he has a modified shoe and he is very active walking up to 4 miles a day. He is followed by Dr. Geroge Baseman of podiatry. His wife states that he underwent a removal of callus over this site on July 9. She felt there may be drainage from this site after that although she could never really determined and there was callus buildup again. On 06/01/18 there was pressure bleeding through the overlying callus. he was given a prescription for 7 days of Bactrim. Fortuitously he has an appointment with Dr. Andree Elk on 06/04/18 and he immediately underwent revascularization of the right leg although I have not had a chance to review these records in care everywhere. This was apparently done through anterior and retrograde access. On 06/06/18 he had another debridement by Dr. Bernette Mayers. Vitamin soaking with Epsom salts for 20 minutes and applying calcium alginate. The original trans-met was on April 2017 I believe by Dr. Doran Durand. His ABI in our clinic was noncompressible today. 07/02/18; x-ray I ordered last week was negative for osteomyelitis. Swab culture was also negative. He is going to require an MRI which I have ordered today. 07/09/18; surprisingly the MRI of the foot that I ordered did not show osteomyelitis. He did suggest the possibility of cellulitis. For this reason I'll go ahead and give him a 10 day course of doxycycline. Although the previous culture of this area was negative 07/16/18; it arrives with the wound looking much the same. Roughly the same depth. He has thick subcutaneous tissue around the wound orifice but this still has roughly the same depth. Been using silver alginate. We applied Oasis #1  today 07/23/2018; still having to remove a lot of callus and thick subcutaneous tissue to actually define the wound here. Most of this seems to have closed down yet he has a comma shaped divot over the top of the area that still I think is open. We applied Oasis #2 His wife expressed concern about the tip of his left great toe. This almost looks like a small blister. She also showed it today to her podiatrist Dr. Geroge Baseman who did not think this was anything serious. I am not sure is anything serious either however I think it bears some watching. He is not in any pain however he is insensate 07/30/2018;; still on a lot of nonviable tissue over the surface of the wound however cleaning this up reveals a more substantial wound orifice but less of a probing wound  depth. This is not probed to bone. There is no evidence of infection The wife is still concerned about a non-open area on the tip of his left great toe. Almost feels like a bony outgrowth. She had previously showed this to podiatry. I do not think this is a blister. A friction area would be possible although he is really not walking according to his wife. He had a small skin tag on his right buttock but no open wound here either 08/06/2018; we applied a third Oasis last week. Unfortunately there is really no improvement. Still requiring extensive debridement to expose the wound bed from a horizontal slitlike depression. I still have not been able to get this to fill in properly. On the positive side there is now no probable bone from when he first came into the facility. I changed him to silver alginate today after a reasonably aggressive debridement 08/13/2018; once again the patient comes in with skin and subcutaneous tissue closing over the small probing area with the underlying cavity of the wound on the right TMA site. I applied silver alginate to this last week. Prior to that we used Oasis x3 still not able to get this area granulating. He  does not have a probing area of the bone which is an improvement from when I spur started working on this and an MRI did not suggest osteomyelitis. I do not see evidence of infection here but I am increasingly concerned about why I cannot get this area to granulate. Each time I debrided this is looks like this is simply a matter of getting granulation to fill in the hole and then getting epithelialization. This does not seem to happen Also I sent him back to see podiatry Dr. Earleen Newport about the what felt to be bony outgrowth on the tip of his left great toe. Apparently after heel he left the clinic last week or the next day he developed a blood blister. He did see Dr. Earleen Newport. He went on to have a debridement of the medial nail cuticle he now has an open area here as well as some denuded skin. They did an x-ray apparently does have a bony outgrowth or spur but I am not able to look at this. They are using topical antibiotics apparently there was some suggested he use Santyl. Patient's wife was anxious for my opinion of this 08/20/2018; we are able to keep the wound open this time instead of the thick subcutaneous tissue closing over the top of it however unfortunately once again this probes to bone. I do not see any evidence of infection and previous MRI did not show osteomyelitis. I elected to go back to the Oasis to see if we can stimulate some granulation. Last saw his vascular interventional cardiologist Dr. Andree Elk at the beginning of August and he had a repeat procedure. Nevertheless I wonder how much blood flow he has down to this area With regards to the left first toe he is seeing Dr. Earleen Newport next week. They are applying Bactroban to this area. 08/27/2018 ooOnce again he comes in with thick eschar and subcutaneous tissue over the top of the small probing hole. This does not appear to go down to bone but it still has roughly the same depth. I reapplied Oasis today ooOver the left great toe  there appears to be more of the wound at the tip of his toe than there was last week. This has an eschar on the surface of it. Will change to  Santyl. There is seeing podiatry this afternoon 09/03/2018 ooHe comes in today with the area on the transmetatarsal's site with a fair amount of callus, nonviable tissue over the circumference but it was not closed. I removed all of this as well as some subcutaneous debris and reapplied Oasis. There is no exposed bone ooThe area over the tip of the left great toe started off as a nodule of uncertain etiology. He has been followed with podiatry. They have been removing part of the medial nail bed. He has nonviable tissue over the wound he been using Santyl in this area 09/10/18 ooUnfortunately comes in with neither wound area looking improved. The transmetatarsal amputation site once Again has nonviable debris over the surface requiring debridement. Unfortunately underneath this there is nothing that looks viable and this once again goes right down to bone. There is no purulent drainage and no erythema. ooAlso the surgical wound from podiatry on the left first toe has an ischemic-looking eschar over the surface of the tip of the toe I have elected not to attempt candidly debride this ooHis wife as arranged for him to have follow-up noninvasive studies in [place] under the care of Dr. Andree Elk clinic. The question is he has known severe PAD. They had recently seen him in August. He has had revascularizations in both legs within the last 4 or 5 months. He is not complaining of pain and I cannot really get a history of claudication. He has not systemically unwell 09/20/2018 the patient has been to [facility] and been revascularized by Dr. Andree Elk earlier this week. Apparently he was able to open up the anterior tibial artery although I have not actually seen his formal report. He is going for an attempt to revascularize on the left on Monday. He is apparently  working with an investigational stent for lower extremity arteries below the knee and he has talked to the patient about placing that on Monday if possible. The patient has been using silver alginate on the transmetatarsal amputation site and Santyl on the left 09/30/2018; patient had his revascularization on the left this apparently included a standard approach as well as a more distal arterial catheterization although I do not have any information on this from Dr. Andree Elk. In fact I do not even see the initial revascularization that he had on the right. I have included the arterial history from Dr. Andree Elk last note however below; ASSESSMENT/PLAN: 1. Hx of Critical limb ischemia bilateral lower extremities, PAD: -s/p transmetatarsal amputation of the RLE. -s/p right ATA percutaneous revascularization procedure x2, most recently 11/20/2016 s/p PTA of right AT 100% to less than 20% with a 2.5 x 200 balloon. 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 -03/21/2018 s/p PTA of 90% left popliteal artery to <10% with a 5x20 cutting balloon, PTA of 90% left peroneal to <20% with a 3x20 balloon, PTA of 100% left AT to <20% with a 2.5x220 balloon. 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 Will plan on the LLE soon after. Risks/benefits of procedure have been discussed and patient has elected to proceed. We have been using endoform to the right TMA amputation site wound and Santyl to the left great toe 10/07/2018; the area on the tip of his left  great toe looked better we have been using Santyl here. We continue to have a very difficult probing hole on the right TMA amputation site we have been using endoform. I went on to use his fifth  Oasis today 10/14/2018; the tip of the left great toe continues to look better. We have been using Santyl here the surface however is healthy and I think we can change to an alginate. The right TMA has not changed. Once again he has no superficial opening there is callus and thick subcutaneous tissue over the orifice once you remove this there is the probing area that we have been dealing with without too much change. There is no palpable bone I have been placing Oasis here and put Oasis #6 in this today after a more vigorous debridement 10/21/18; the left great toe still has necrotic surface requiring debridement. The right TMA site hasn't changed in view of the thick callus over the wound bed. With removal of this there is still the opening however this does not appear to have the same depth. Again there is no palpable bone. Oasis was replaced 10/28/2018; patient comes in with both wounds looking worse. The area over the first toe tip is now down to bone. The area over the TMA site is deeper and down to bone clearly with a increase in overall wound area. Equally concerning on the right TMA is the complete absence of a pulse this week which is a change. We are not even able to Doppler this. On the right he has a noncompressible ABI greater than 1.4 11/04/2018. Both wounds look somewhat worse. X-rays showed no osteomyelitis of the right foot but on the left there was underlying osteomyelitis in the left great toe distal phalanx. I been on the phone to Dr. Andree Elk surface at [facility] in [place] and they are arranging for another angiogram on the right on Monday. I have him on doxycycline for the osteomyelitis in the left great toe for now. Infectious disease may be necessary 1/17; 2-week hiatus. Patient was admitted to hospital at [facility]. My understanding is he underwent an angioplasty of the right anterior tibial artery and had stents placed in the left anterior artery and the left tibial peroneal  trunk. This was done by Dr. Andree Elk of interventional radiology. There is no major change in either 1 of the wounds. They have been using Aquacel Ag. As far as they are aware no imaging studies were done of the foot which is indeed unfortunate. I had him on doxycycline for 2 weeks since we identified the osteomyelitis in the left great toe by plain x-ray. I have renewed that again today. I am still suspicious about osteomyelitis in the amputation site and would consider doing another MRI to compare with the one done in September. The idea of hyperbaric oxygen certainly comes up for discussion 1/24; no major change in either wound area. I have him on doxycycline for osteomyelitis at the tip of the left great toe. As noted he has been previously and recently revascularized by Dr. Andree Elk at [facility]. He is tolerating the doxycycline well. For some reason we do not have an infectious disease consult yet. Culture of drainage from the right foot site last week was negative 1/31; MRI of the right foot did not show osteomyelitis of the right ankle and foot. Notable for a skin ulceration overlying the second metatarsal stump with generalizing soft tissue edema of the ankle and foot consistent with cellulitis. Noted to have a  partial-thickness tear of the Achilles tendon 7.5 cm proximal to the insertion Nothing really new in terms of symptoms. Patient's area on the tip of the left great toe is just about closed although he still has the probing area on the metatarsal amputation site. Appointment with Dr. Linus Salmons of infectious disease next week 2/7; Dr. Novella Olive did not feel that any further antibiotics were necessary he would follow-up in 2 months. The left great toe appears to be closed still some surface callus that I gently looked under high did not see anything open or anything that was threatening to be open. He still has the open area on the mid part of his TMA site using endoform 2/14; left great toe is closed  and he is completing his doxycycline as of last Sunday. He is not on any antibiotics. Unfortunately out of the right foot his wife noticed some subdermal hemorrhage this week. He had been walking 2 miles I had given him permission to do so this is not really a plantar wound. Using endoform to this wound but I changed to silver alginate this week 2/21; left great toe remains closed. Culture last week grew Streptococcus angiosis which I am not really familiar with however it is penicillin sensitive and I am going to put him on Augmentin. Not much change in the wound on the right foot the deep area is still probing precariously close to bone and the wound on the margin of the TMA is larger. 2/28; left great toe remains closed. He is completing the Augmentin I gave him last week. 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 This is being shown to Dr. Andree Elk at [place] to see if there is anything else that can be done here. He has been using silver alginate strips on the right 3/6; left great toe remains closed. He sees Dr. Andree Elk on Monday. The area on the plantar aspect of the right foot has the same small orifice with thick callused tissue around this. However this time with removal of the callus tissue the wound is open all the way along the incision line to the end medially. We have been using silver alginate. 3/13; left toe remains closed although the area is callused. He sees Dr. Jacqualyn Posey of podiatry next week. The area on the plantar right foot looked a lot better this week. Culture I did of this was negative we use silver alginate. He is going next week for an attempt at revascularization by Dr. Andree Elk 3/23; left toe remains closed although the area is callused. He will see Dr. Jacqualyn Posey in follow-up. The area on the right plantar foot continues to look surprisingly better over the last 3 visits. We have been using silver alginate. The revascularization he was supposed  to have by Dr. Andree Elk at [facility] in [place] has been canceled [facility] procedure] 4/6; the right foot remains closed albeit callused. Podiatry canceled the appointment with regards to the left great toe. He has not seen Dr. Andree Elk at [facility] but thinks that Dr. Andree Elk has "done all he can do". He would be a candidate for the hemostaemix trial Readmission 07/29/2019 Mr. Stann Mainland is a man we know well from at least 3 previous stays in this clinic. He is a type II diabetic with severe PAD followed by Dr. Andree Elk at [facility] in [place]. During his last stay here he had a probing wound bone in his right TMA site and a episode of osteomyelitis on the  tip of the left great toe at a surgical site. So far everything in both of these areas has remained closed. About 2 weeks ago he went to see his podiatrist at [facility] Dr. Babs Bertin. He had a thick callus on the left fifth metatarsal head that was shaved. He has developed an open wound in this area. They have been offloading this in his diabetic shoes. The patient has not had any more revascularizations by Dr. Andree Elk since the last time he was here. ABI in our clinic at the posterior tibial on the left was 1.06 10/6; no real change in the area on the plantar met head. Small wound with 2 mm of depth. He has thick skin probably from pressure around the wound. We have been using silver alginate 10/13; small wound in the left fifth plantar met head. Arrives today with undermining laterally and purulent drainage. Our intake nurse cultured this. His wife stated they noticed a change in color over the last day or 2. He is not systemically unwell 10/19; small wound on the fifth plantar metatarsal head. Culture I did last week showed Staphylococcus lugdunensis. Although this could be a skin contaminant the possibility of a skin and soft tissue infection was there. I did give him empiric doxycycline which should have covered this. We are using  silver alginate to the wound. We put him in a total contact cast today. 10/22; small wound on the fifth plantar metatarsal head. He has completed antibiotics. He also has severe PAD which worries me about just about any wound on this man's foot Objective Constitutional Vitals Time Taken: 11:11 AM, Temperature: 98.2 F, Pulse: 79 bpm, Respiratory Rate: 16 breaths/min, Blood Pressure: 101/53 mmHg, Capillary Blood Glucose: 90 mg/dl. General Notes: glucose per pt report Integumentary (Hair, Skin) Wound #5 status is Open. Original cause of wound was Gradually Appeared. The wound is located on the Left Metatarsal head fifth. The wound measures 1cm length x 1.5cm width x 0.1cm depth; 1.178cm^2 area and 0.118cm^3 volume. There is Fat Layer (Subcutaneous Tissue) Exposed exposed. There is no tunneling or undermining noted. There is a small amount of serosanguineous drainage noted. The wound margin is thickened. There is large (67-100%) red, pink granulation within the wound bed. There is no necrotic tissue within the wound bed. 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 There was a Total Contact Cast Procedure by Ricard Dillon., MD. Post procedure Diagnosis Wound #5: Same as Pre-Procedure Plan Follow-up Appointments: Return Appointment in 1 week. Dressing Change Frequency: Wound #5 Left Metatarsal head fifth: Do not change entire dressing for one week. Wound Cleansing: Wound #5 Left Metatarsal head fifth: May shower with protection. Primary Wound Dressing: Wound #5 Left Metatarsal head fifth: Calcium Alginate with  Silver Secondary Dressing: Wound #5 Left Metatarsal head fifth: Foam Dry Gauze Drawtex Off-Loading: Total Contact Cast to Left Lower Extremity 1. We use silver alginate under the total contact cast 2. I am worried about this small wound. Slightly dry if this does not respond I will try silver collagen. Electronic Signature(s) Signed: 08/21/2019 6:29:04 PM By: [name] MD Entered By: [name] on 08/21/2019 18:23:17 -------------------------------------------------------------------------------- Total Contact Cast  Details Patient Name: Date of Service: CLELL, TRAHAN 08/21/2019 11:15 AM Medical Record [id number] Patient Account Number: [id number] Date of Birth/Sex: 1946-09-11 (73 y.o. M) Treating RN: Deon Pilling Primary Care Provider: [name] Other Clinician: Referring Provider: Treating Provider/Extender:[name], [name], [name] in Treatment: 3 Total Contact Cast Applied for Wound Assessment: Wound #5 Left Metatarsal head fifth Performed By: Physician Ricard Dillon., MD Post Procedure Diagnosis Same as Pre-procedure Electronic Signature(s) Signed: 08/21/2019 6:29:04 PM By: [name] MD Entered By: [name] on 08/21/2019 18:21:02 -------------------------------------------------------------------------------- SuperBill Details Patient Name: Date of Service: URA, HAUSEN 08/21/2019 Medical Record [id number] Patient Account Number: [id number] Date of Birth/Sex: Treating RN: April 20, 1946 (73 y.o. Lorette Ang, Meta.Reding Primary Care Provider: [name] Other Clinician: Referring Provider: Treating Provider/Extender:[name], [name], [name] in Treatment: 3 Diagnosis Coding ICD-10 Codes Code Description E11.621 Type 2 diabetes mellitus with foot ulcer E11.51 Type 2 diabetes mellitus with diabetic peripheral angiopathy without gangrene L97.521 Non-pressure chronic ulcer of other part of left foot limited to  breakdown of skin L03.116 Cellulitis of left lower limb Facility Procedures CPT4 Code: 41991444 Description: 29445 - APPLY TOTAL CONTACT LEG CAST ICD-10 Diagnosis Description E11.621 Type 2 diabetes mellitus with foot ulcer Modifier: Quantity: 1 Physician Procedures CPT4 Code: 5848350 Description: 75732 - WC PHYS APPLY TOTAL CONTACT CAST ICD-10 Diagnosis Description E11.621 Type 2 diabetes mellitus with foot ulcer Modifier: Quantity: 1 Electronic Signature(s) Signed: 08/21/2019 6:29:04 PM By: [name] MD Entered By: [name] on 08/21/2019 18:23:30

## 2019-08-28 ENCOUNTER — Other Ambulatory Visit: Payer: Self-pay

## 2019-08-28 ENCOUNTER — Encounter (HOSPITAL_BASED_OUTPATIENT_CLINIC_OR_DEPARTMENT_OTHER): Payer: Medicare Other | Admitting: Internal Medicine

## 2019-08-28 DIAGNOSIS — E11621 Type 2 diabetes mellitus with foot ulcer: Secondary | ICD-10-CM | POA: Diagnosis not present

## 2019-08-28 NOTE — Progress Notes (Signed)
[name], [name] ([id number]) Visit Report for 08/28/2019 Arrival Information Details Patient Name: Date of Service: [name], [name] 08/28/2019 10:30 AM Medical Record [id number] Patient Account Number: [id number] Date of Birth/Sex: 12-Apr-1946 (73 y.o. M) Treating RN: Baruch Gouty Primary Care Renalda Locklin: [name] Other Clinician: Referring Carlia Bomkamp: Treating Joscelyne Renville/Extender:[name], [name], [name] in Treatment: 4 Visit Information History Since Last Visit Added or deleted any medications: No Patient Arrived: Ambulatory Any new allergies or adverse reactions: No Arrival Time: 11:35 Had a fall or experienced change in No Accompanied By: spouse activities of daily living that may affect Transfer Assistance: None risk of falls: Patient Identification Verified: Yes Signs or symptoms of abuse/neglect No Secondary Verification Process Yes since last visito Completed: Hospitalized since last visit: No Patient Requires Transmission-Based No Implantable device outside of the clinic No Precautions: excluding Patient Has Alerts: No cellular tissue based products placed in the center since last visit: Has Dressing in Place as Prescribed: Yes Has Footwear/Offloading in Place as Yes Prescribed: Left: Total Contact Cast Pain Present Now: No Electronic Signature(s) Signed: 08/28/2019 2:54:55 PM By: Baruch Gouty RN, BSN Entered By: Baruch Gouty on 08/28/2019 11:36:24 -------------------------------------------------------------------------------- Lower Extremity Assessment Details Patient Name: Date of Service: [name], [name] 08/28/2019 10:30 AM Medical Record [id number] Patient Account Number: [id number] Date of Birth/Sex: 1946/08/07 (72 y.o. M) Treating RN: Baruch Gouty Primary Care Jonta Gastineau: [name] Other Clinician: Referring Arnika Larzelere: Treating Deitrich Steve/Extender:[name], [name], [name] in Treatment: 4 Edema  Assessment Assessed: [Left: No] [Right: No] Edema: [Left: N] [Right: o] Calf Left: Right: Point of Measurement: cm From Medial Instep 39.4 cm cm Ankle Left: Right: Point of Measurement: cm From Medial Instep 26.8 cm cm Vascular Assessment Pulses: Dorsalis Pedis Palpable: [Left:Yes] Electronic Signature(s) Signed: 08/28/2019 2:54:55 PM By: Baruch Gouty RN, BSN Entered By: Baruch Gouty on 08/28/2019 11:42:08 -------------------------------------------------------------------------------- Multi-Disciplinary Care Plan Details Patient Name: Date of Service: [name], [name] 08/28/2019 10:30 AM Medical Record [id number] Patient Account Number: [id number] Date of Birth/Sex: 05-19-1946 (72 y.o. M) Treating RN: Deon Pilling Primary Care Osualdo Hansell: [name] Other Clinician: Referring Sarabella Caprio: Treating Samhita Kretsch/Extender:[name], [name], [name] in Treatment: 4 Active Inactive Wound/Skin Impairment Nursing Diagnoses: Knowledge deficit related to ulceration/compromised skin integrity Goals: Patient/caregiver will verbalize understanding of skin care regimen Date Initiated: 07/29/2019 Target Resolution Date: 09/05/2019 Goal Status: Active Ulcer/skin breakdown will have a volume reduction of 30% by week 4 Date Initiated: 07/29/2019 Target Resolution Date: 09/05/2019 Goal Status: Active Interventions: Assess patient/caregiver ability to obtain necessary supplies Assess patient/caregiver ability to perform ulcer/skin care regimen upon admission and as needed Assess ulceration(s) every visit Notes: Electronic Signature(s) Signed: 08/28/2019 3:04:52 PM By: Deon Pilling Entered By: Deon Pilling on 08/28/2019 14:02:46 -------------------------------------------------------------------------------- Pain Assessment Details Patient Name: Date of Service: [name], [name] 08/28/2019 10:30 AM Medical Record [id number] Patient Account Number: [id number] Date of  Birth/Sex: 1946-06-14 (72 y.o. M) Treating RN: Baruch Gouty Primary Care Barth Trella: [name] Other Clinician: Referring Tyree Fluharty: Treating Sheila Ocasio/Extender:[name], [name], [name] in Treatment: 4 Active Problems Location of Pain Severity and Description of Pain Patient Has Paino No Site Locations Rate the pain. Current Pain Level: 0 Pain Management and Medication Current Pain Management: Electronic Signature(s) Signed: 08/28/2019 2:54:55 PM By: Baruch Gouty RN, BSN Entered By: Baruch Gouty on 08/28/2019 11:38:27 -------------------------------------------------------------------------------- Patient/Caregiver Education Details Patient Name: [name] 10/29/2020andnbsp10:30 Patient Name: [name] 10/29/2020andnbsp10:30 Date of Service: AM Medical Record [id number] Number: Patient Account Number: [id number] Treating RN: 06-25-1946 (72 y.o. Deon Pilling Date of Birth/Gender: M) Other Clinician: [place],  Jacqualine Mau, Casimiro Needle Physician: Physician/Extender: Referring Physician: Joaquin Bend in Treatment: 4 Education Assessment Education Provided To: Patient Education Topics Provided Wound/Skin Impairment: Handouts: Skin Care Do's and Dont's Methods: Explain/Verbal Responses: Reinforcements needed Electronic Signature(s) Signed: 08/28/2019 3:04:52 PM By: Shawn Stall Entered By: Shawn Stall on 08/28/2019 14:02:57 -------------------------------------------------------------------------------- Wound Assessment Details Patient Name: Date of Service: [name], [name] 08/28/2019 10:30 AM Medical Record [id number] Patient Account Number: [id number] Date of Birth/Sex: Apr 17, 1946 (72 y.o. M) Treating RN: [name] Primary Care Lakara Weiland: [name] Other Clinician: Referring Cadell Gabrielson: Treating Eldoris Beiser/Extender:[name], [name], [name] in Treatment: 4 Wound Status Wound Number: 5  Primary Diabetic Wound/Ulcer of the Lower Extremity Etiology: Wound Location: Left Metatarsal head fifth Wound Open Wounding Event: Gradually Appeared Status: Date Acquired: 07/14/2019 Comorbid Cataracts, Chronic sinus Weeks Of Treatment: 4 History: problems/congestion, Coronary Artery Clustered Wound: No Disease, Hypertension, Peripheral Arterial Disease, Type II Diabetes, Osteoarthritis, Neuropathy Wound Measurements Length: (cm) 0.7 % Reductio Width: (cm) 1.3 % Reductio Depth: (cm) 0.1 Epithelial Area: (cm) 0.715 Tunneling Volume: (cm) 0.071 Undermini Wound Description Classification: Grade 1 Wound Margin: Thickened Exudate Amount: Small Exudate Type: Serous Exudate Color: amber Wound Bed Granulation Amount: Small (1-33%) Granulation Quality: Pink Necrotic Amount: Large (67-100%) Necrotic Quality: Adherent Slough Foul Odor After Cleansing: No Slough/Fibrino No Exposed Structure Fascia Exposed: No Fat Layer (Subcutaneous Tissue) Exposed: Yes Tendon Exposed: No Muscle Exposed: No Joint Exposed: No Bone Exposed: No n in Area: -1034.9% n in Volume: -446.2% ization: None : No ng: No Electronic Signature(s) Signed: 08/28/2019 2:54:55 PM By: [name] RN, BSN Entered By: [name] on 08/28/2019 11:45:13 -------------------------------------------------------------------------------- Vitals Details Patient Name: Date of Service: [name], [name] 08/28/2019 10:30 AM Medical Record [id number] Patient Account Number: [id number] Date of Birth/Sex: 02-Jan-1946 (73 y.o. M) Treating RN: [name] Primary Care Braelyn Bordonaro: [name] Other Clinician: Referring Kieren Adkison: Treating Dewayne Severe/Extender:[name], [name], [name] in Treatment: 4 Vital Signs Time Taken: 11:37 Temperature (F): 97.7 Height (in): 69 Pulse (bpm): 77 Source: Stated Respiratory Rate (breaths/min): 18 Weight (lbs): 210 Blood Pressure (mmHg): 115/69 Source:  Stated Capillary Blood Glucose (mg/dl): 295 Body Mass Index (BMI): 31 Reference Range: 80 - 120 mg / dl Notes glucose per pt report this am Electronic Signature(s) Signed: 08/28/2019 2:54:55 PM By: [name] RN, BSN Entered By: [name] on 08/28/2019 11:38:18

## 2019-09-01 NOTE — Progress Notes (Signed)
[name], [name] ([id number]) Visit Report for 08/28/2019 HPI Details Patient Name: Date of Service: [name], [name] 08/28/2019 10:30 AM Medical Record [id number] Patient Account Number: [id number] Date of Birth/Sex: November 09, 1945 (73 y.o. M) Treating RN: Deon Pilling Primary Care Provider: [name] Other Clinician: Referring Provider: Treating Provider/Extender:Laylah Riga, [name], [name] in Treatment: 4 History of Present Illness HPI Description: 05/18/16; this is a 73year-old diabetic who is a type II diabetic on insulin. The history is that he traumatized his right foot developed a sore sometime in late March. Shortly thereafter he went on a cruise but he had to get off the cruise ship in [place] and fly urgently back to [place] where he was admitted to [facility] and ultimately underwent a transmetatarsal amputation by Dr. 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 According to the patient and his wife this wound never really healed. He was seen on 2 occasions in the wound care center in [place] and had vascular studies and then was referred urgently to Dr. Bridgett Larsson of vascular surgery. He underwent an angiogram on 05/10/16. Unfortunately nothing really could be done to improve his vascular status. He had a 75-90% stenosis in the midsegment of 1 segment of the posterior femoral artery. He had a patent popliteal, his anterior tibial occluded shortly after takeoff. Perineal had a greater than 90% stenosis posterior tibial is occluded feet had no distal collaterals feed distal aspect of the transmetatarsal amputation site. The patient tells me that he had a prolonged period of Santyl by Dr. Doran Durand was some initial improvement but then this was stopped. I think they're only applying daily dressings/dry dressings. He has not had a recent x-ray of the right foot he did have one before his surgery in April. His wife by the dimensions of the wound/surgical site being followed at  home since 4/20. At that point the dimensions were 0.5 x 12 x 0.2 on 7/19 this was 1.8 x 6 x 0.4. He is not currently on any antibiotics. His hemoglobin A1c in early April was 12.9 at that point he was started on insulin. Apparently his blood sugars are much lower he has an appointment with Dr. Legrand Como Alteimer of endocrine next week. 05/29/16 x-ray of the area did not show osteomyelitis. I think he probably needs an MRI at this point. His wife is asking about something called"Yireh" cream which is not FDA approved. I have not heard of this. 06/22/16; MRI did not really suggest osteomyelitis. There was minimal marrow edema and enhancement in the stump of the second metatarsal felt to be secondary likely to postoperative change rather than osteomyelitis. The patient has arranged his own consultation with Dr. Andree Elk at [facility], apparently their daughter lives in [place] and has some connection here. Any improvement in vascular supply by Dr. Andree Elk would of course be helpful. Dr. Bridgett Larsson did not feel that anything further could be done other than amputation if wound care did not result in healing or if the area deteriorates. 06/26/16; the patient has been to see Dr. Andree Elk at [facility] and had an angiogram. He is going for a procedure on Thursday which will involve catheterization. I'm not sure if this is an anterograde or retrograde approach. He has been using Santyl to the wound 07/10/16; the patient had a repeat angiogram and angioplasty at [facility] by Dr. Brunetta Jeans. His angiogram showed right CFA and profundal widely patent. The right as of a.m. popliteal artery were widely patent the right anterior tibial was occluded proximally and reconstitutes at  the ankle. Peroneal artery was patent to the foot. Posterior tibial artery was occluded. The patient had angioplasty of the anterior tibial artery.. This was quite successful. He was recommended for Plavix as well as aspirin. 07/17/16; the patient was close to be a nurse  visit today however the outer dressing of the Apligraf fell off. Noted drainage. I was asked to see the wound. The patient is noted an odor however his wife had noted that. Drainage with Apligraf not necessarily a bad thing. He has not been systemically unwell 07/24/16; we are still have an issue with drainage of this wound. In spite of this I applied his second Apligraf. Medially the area still is probing to bone. 08/07/16; Apligraf reapplied in general wound looks improved. 08/21/16 Apligraf #4. Wound looks much better 09/04/16 patientt's wound again today continues to appear to improve with the application of the Apligraf's. He notes no increased discomfort or concerns at this point in time. 09/18/16; the patient returns today 2 weeks after his fifth application of Apligraf. Predictably three quarters of the width of this wound has healed. The deep area that probe to bone medially is still open. The patient asked how much out-of-pocket dollars would be for additional Apligraf's. 09/25/16; now using Hydrofera Blue. He has completed 5 Apligraf applications with considerable improvement in this deep open transmetatarsal amputation site. His wound is now a triangular-shaped wound on the medial aspect. At roughly 12 to 2:00 this probes another centimeter but as opposed to in the past this does not probe to bone. The patient has been seen at [facility] by Dr. Zenia Resides. He is not planning to do any more revascularization unless the wound stalls or worsens per the patient 10/02/16; 0.7 x 0.8 x 0.8. Unfortunately although the wound looks stable to improved. There is now easily probable bone. This hasn't been present for several weeks. Patient is not otherwise symptomatic he is not experiencing any pain. I did a culture of the wound bed 10/09/16. Deterioration last week. Culture grew MRSA and although there is improvement here with doxycycline prescribed over the phone I'm going to try to get him linezolid 600 twice  a day for 10 days today. 10/16/16; he is completing a weeks worth of linezolid and still has 3 more days to go. Small triangular-shaped open area with some degree of undermining. There is still palpable bone with a curet. Overall the area appears better than last week 10/20/16 he has completed the linezolid still having some nausea and vomiting but no diarrhea. He has exposed bone this week which is a deterioration. 10/27/16 patient now has a small but probing wound down to bone. Culture of this bone that I did last week showed a few methicillin-resistant staph aureus. I have little doubt that this represents acute/subacute osteomyelitis. The patient is currently on Doxy which I will continue he also completed 10 days of linezolid. We are now in a difficult situation with this patient's foot after considerable discussion we will send him back to see Dr. Doran Durand for a surgical opinion of this I'm also going to try to arrange a infectious disease consult at [facility] hopefully week and get this prior to her usual 4-6 weeks we having [place]. The patient clearly is going to need 6 weeks of IV vancomycin. If we cannot arrange this expediently I'll have to consider ordering this myself through a home infusion company 11/03/16; the patient now has a small in terms of circumference but probing wound. No bone palpable today. The  patient remains on doxycycline 100 twice a day which should support him until he sees infectious disease at [facility] next week the following week on Wednesday I believe he has an appointment with Dr. Andree Elk at [facility] who is his vascular cardiologist. Finally he has an appointment with Dr. Doran Durand on 11/22/16 we have been using silver alginate. The patient's wife states they are having trouble getting this through [facility] 11/13/16; the patient was seen by infectious disease at [facility] in the 11th PICC line placed in preparation for IV antibiotics. A tummy he has not going to get IV  vancomycin o Ceftaroline. They've also ordered an MRI. Patient has a follow-up with Dr. Doran Durand on 11/22/16 and Dr. Andree Elk at [facility] tomorrow 11/23/16 the patient is on daptomycin as directed by infectious disease at [facility]. He is also been back to see Dr. Andree Elk at [facility]. He underwent a repeat arteriogram. He had a successful PTA of the right anterior tibial artery. He is on dual antiplatelete treatment with Plavix and aspirin. Finally he had the MRI of his foot in [place]. This showed cellulitis about the foot worse distally edema and enhancement in the reminiscent of the second metatarsal was consistent with osteomyelitis therefore what I was assuming to be the first metatarsal may be actually the second. He also has a fluid collection deep to the calcaneus at the level of the calcaneal spur which could be an abscess or due to adventitial bursitis. He had a small tear in his Achilles 11/30/16; the patient continues on daptomycin as directed by infectious disease at [facility]. He is been revascularized by Dr. Andree Elk at [facility] in [place]. He has been to see Gretta Arab who was the orthopedic surgeon who did his original amputation. I have not seen his not however per the patient's wife he did not offer another surgical local surgical prodecure to remove involved bone. He verbalized his usual disbelief in not just hyperbarics but any medical therapy for this condition(osteomyelitis). I discussed this in detail with the patient today including answering the question about a BKA definitively "curing" the current condition. 12/07/16; the patient continues on daptomycin as directed by infectious disease at [facility]. This is directed at the MRSA that we cultured from his bone debridement from 12/22. Lab work today shows a white count of 8.7 hemoglobin of 10.5 which is microcytic and hypochromic differential count shows a slightly elevated monocyte count at 1.2 eosinophilic count of 0.6. His  creatinine is 1.11 sedimentation rate apparently is gone from 35-34 now 40. I explained was wife I don't think this represents a trend. His total CK is 48 12/14/16- patient is here for follow-up evaluation of his right TMA site. He continues to receive IV daptomycin per infectious disease. His serum inflammatory markers remain elevated. He complains of intermittent pain to the medial aspect of the TMA site with intermittent erythema. He voices no complaints or concerns regarding hyperbaric therapy. Overall he and his wife are expressing a frustration and discouragement regarrding the length of time of treatment. 12/21/16; small open wound at roughly the first or second metatarsal metatarsalphalyngeal joint reminiscence of his transmetatarsal amputation site he continues to receive IV daptomycin per infectious disease at [facility]. He will finish these a week tomorrow. He has lab work which I been copied on. His white count is 10.8 hemoglobin 8.9 MCV is low at 75., MCH low at 24.5 platelet count slightly elevated at 626. Differential count shows 70% neutrophils 10% monocytes and 10% eosinophils. His  comprehensive metabolic panel shows a slightly low sodium at 133 albumin low at 3.1 total CK is normal at 42 sedimentation rate is much higher at 82. He has had iron studies that show a serum iron of 15 and iron binding capacity of 238 and iron saturation of 6. This is suggestive of iron deficiency. B12 and folate were normal ferritin at 113 The patient tells me that he is not eating well and he has lost weight. He feels episodically nauseated. He is coughing and gagging on mucus which she thinks is sinusitis. He has an appointment with his primary doctor at 5:00 this afternoon in [facility] 01/02/17; the patient developed a subacute pneumonitis. He was admitted to [facility] after a CT scan showed an extensive interstitial pneumonitis [I have not yet seen this]. 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 He has since been discharged. He is not on oxygen. He feels fatigued and very short of breath with exertion. At [facility] the wound care nurse there felt that his wound was healed. He did complete his daptomycin and has follow-up with infectious disease on Friday. X-rays I did before he went to [facility] still suggested residual osteomyelitis in the anterior aspect of the second must metatarsal head. I'm not sure I would've expected any different. There was no other findings. I actually think I did this because of erythema over the first metatarsal head reminiscent 01/11/17; the patient has eosinophilic pneumonitis. He has been reviewed by pulmonology and given clearance for hyperbarics at least that's what his wife says. I'll need to see if there is note in care everywhere. Apparently the prognosis for improvement of daptomycin induced eosinophilic granulocyte is is 3 months without steroids. In the meantime infectious disease has placed him on doxycycline until the wound is closed. He is still is lost a lot of weight and his blood sugars are running in the mid 60s to low 80s fasting and at all times during the day 01/18/17; he has had adjustments in his insulin apparently his blood sugars in the morning or over 100. He wants to restart his hyperbaric treatment we'll do this at 1:00. 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 I think there is good reason to complete his treatments in order to give him the best chance of maintaining a healed status and these DFU 3 wounds with MRSA infection in the bone 02/15/17; the patient was seen today in conjunction with HBO. He completed hyperbaric oxygen today. The open area on his transmetatarsal site has remained closed. There was an area of erythema when I saw him  earlier in the week on the posterior heel although that is resolved as of today as well. He has been using a cam walker. This is a patient who came to [place] after a transmetatarsal amputation that was necrotic and dehisced. He required revascularization percutaneously on 2 different occasions by Dr. Andree Elk of invasive cardiology at [facility]. 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 He went to [facility] infectious disease and had IV daptomycin for 5 weeks before developing eosinophilic pneumonitis and requiring an admission to hospital/ICU. He made a good recovery and is continued on doxycycline since. As mentioned his foot is closed now. He has a follow-up with Dr. Andree Elk tomorrow. He is going  to [employer] on Monday for a custom-made shoe READMISSION Last visit Dr. Andree Elk [facility] 02/16/17 1. Critical limb ischemia of the RLE: s/p transmetatarsal amputation of the RLE, now completely healed. s/p right ATA percutaneous revascularization procedure x2, most recently 11/20/2016 s/p PTA of right AT 100% to less than 20% with a 2.5 x 200 balloon. He does have some residual osteomyelitis, but given the wound is closed, the orthopedist has recommended to follow. He has a fitting for a special shoe coming up next week. He has completed a course of daptomycin and doxycycline and he has been discharged by ID. He has completed a course of hyperbaric therapy. Continue Continue medical management with aspirin, Plavix and statin therapy. We will discuss ongoing Plavix therapy at follow-up in 6 months. 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 Given no symptoms, we will conservatively manage. He doesn't want to do any more invasive studies at this time, which is reasonable. The patient  arrives today out of 2 concerns both on the right transmetatarsal site. 1 at the level of the reminiscent fifth metatarsal head and the other at roughly the first or second. Both of these look like dark subcutaneous discoloration probably subdermal bleeding. He is recently obtained new adaptive footwear for the right foot. He also has a callus on the left fifth dorsal toe however he follows with podiatry for this and I don't think this is any issue. ABIs in this clinic today were 0.66 on the right and 1.06 on the left. On entrance into our clinic initially this was 0.95 and 0.92. He does not describe current claudication. They're going away on a cruise in 8 weeks and I think are trying to do the month is much as they can proactively. They follow with podiatry and have an appointment with Dr. Andree Elk in October READMISSION 06/25/18 This is a patient that we have not seen in almost a year. He is a type II diabetic with known PAD. He is followed by Dr. Andree Elk of interventional cardiology at [facility] in [place]. He is required revascularization for significant PAD. When we first saw him he required a transmetatarsal amputation. He had underlying osteomyelitis with a nonhealing surgical wound. This eventually closed with wound care, IV antibiotics and hyperbaric oxygen. They tell me that he has a modified shoe and he is very active walking up to 4 miles a day. He is followed by Dr. Geroge Baseman of podiatry. His wife states that he underwent a removal of callus over this site on July 9. She felt there may be drainage from this site after that although she could never really determined and there was callus buildup again. On 06/01/18 there was pressure bleeding through the overlying callus. he was given a prescription for 7 days of Bactrim. Fortuitously he has an appointment with Dr. Andree Elk on 06/04/18 and he immediately underwent revascularization of the right leg although I have not had a chance to review these  records in care everywhere. This was apparently done through anterior and retrograde access. On 06/06/18 he had another debridement by Dr. Bernette Mayers. Vitamin soaking with Epsom salts for 20 minutes and applying calcium alginate. The original trans-met was on April 2017 I believe by Dr. Doran Durand. His ABI in our clinic was noncompressible today. 07/02/18; x-ray I ordered last week was negative for osteomyelitis. Swab culture was also negative. He is going to  require an MRI which I have ordered today. 07/09/18; surprisingly the MRI of the foot that I ordered did not show osteomyelitis. He did suggest the possibility of cellulitis. For this reason I'll go ahead and give him a 10 day course of doxycycline. Although the previous culture of this area was negative 07/16/18; it arrives with the wound looking much the same. Roughly the same depth. He has thick subcutaneous tissue around the wound orifice but this still has roughly the same depth. Been using silver alginate. We applied Oasis #1 today 07/23/2018; still having to remove a lot of callus and thick subcutaneous tissue to actually define the wound here. Most of this seems to have closed down yet he has a comma shaped divot over the top of the area that still I think is open. We applied Oasis #2 His wife expressed concern about the tip of his left great toe. This almost looks like a small blister. She also showed it today to her podiatrist Dr. Geroge Baseman who did not think this was anything serious. I am not sure is anything serious either however I think it bears some watching. He is not in any pain however he is insensate 07/30/2018;; still on a lot of nonviable tissue over the surface of the wound however cleaning this up reveals a more substantial wound orifice but less of a probing wound depth. This is not probed to bone. There is no evidence of infection The wife is still concerned about a non-open area on the tip of his left great toe. Almost feels like a  bony outgrowth. She had previously showed this to podiatry. I do not think this is a blister. A friction area would be possible although he is really not walking according to his wife. He had a small skin tag on his right buttock but no open wound here either 08/06/2018; we applied a third Oasis last week. Unfortunately there is really no improvement. Still requiring extensive debridement to expose the wound bed from a horizontal slitlike depression. I still have not been able to get this to fill in properly. On the positive side there is now no probable bone from when he first came into the facility. I changed him to silver alginate today after a reasonably aggressive debridement 08/13/2018; once again the patient comes in with skin and subcutaneous tissue closing over the small probing area with the underlying cavity of the wound on the right TMA site. I applied silver alginate to this last week. Prior to that we used Oasis x3 still not able to get this area granulating. He does not have a probing area of the bone which is an improvement from when I spur started working on this and an MRI did not suggest osteomyelitis. I do not see evidence of infection here but I am increasingly concerned about why I cannot get this area to granulate. Each time I debrided this is looks like this is simply a matter of getting granulation to fill in the hole and then getting epithelialization. This does not seem to happen Also I sent him back to see podiatry Dr. Earleen Newport about the what felt to be bony outgrowth on the tip of his left great toe. Apparently after heel he left the clinic last week or the next day he developed a blood blister. He did see Dr. Earleen Newport. He went on to have a debridement of the medial nail cuticle he now has an open area here as well as some denuded skin. They  did an x-ray apparently does have a bony outgrowth or spur but I am not able to look at this. They are using topical antibiotics  apparently there was some suggested he use Santyl. Patient's wife was anxious for my opinion of this 08/20/2018; we are able to keep the wound open this time instead of the thick subcutaneous tissue closing over the top of it however unfortunately once again this probes to bone. I do not see any evidence of infection and previous MRI did not show osteomyelitis. I elected to go back to the Oasis to see if we can stimulate some granulation. Last saw his vascular interventional cardiologist Dr. Andree Elk at the beginning of August and he had a repeat procedure. Nevertheless I wonder how much blood flow he has down to this area With regards to the left first toe he is seeing Dr. Earleen Newport next week. They are applying Bactroban to this area. 08/27/2018 Once again he comes in with thick eschar and subcutaneous tissue over the top of the small probing hole. This does not appear to go down to bone but it still has roughly the same depth. I reapplied Oasis today Over the left great toe there appears to be more of the wound at the tip of his toe than there was last week. This has an eschar on the surface of it. Will change to Santyl. There is seeing podiatry this afternoon 09/03/2018 He comes in today with the area on the transmetatarsal's site with a fair amount of callus, nonviable tissue over the circumference but it was not closed. I removed all of this as well as some subcutaneous debris and reapplied Oasis. There is no exposed bone The area over the tip of the left great toe started off as a nodule of uncertain etiology. He has been followed with podiatry. They have been removing part of the medial nail bed. He has nonviable tissue over the wound he been using Santyl in this area 09/10/18 Unfortunately comes in with neither wound area looking improved. The transmetatarsal amputation site once Again has nonviable debris over the surface requiring debridement. Unfortunately underneath this there is  nothing that looks viable and this once again goes right down to bone. There is no purulent drainage and no erythema. Also the surgical wound from podiatry on the left first toe has an ischemic-looking eschar over the surface of the tip of the toe I have elected not to attempt candidly debride this His wife as arranged for him to have follow-up noninvasive studies in [place] under the care of Dr. Andree Elk clinic. The question is he has known severe PAD. They had recently seen him in August. He has had revascularizations in both legs within the last 4 or 5 months. He is not complaining of pain and I cannot really get a history of claudication. He has not systemically unwell 09/20/2018 the patient has been to [facility] and been revascularized by Dr. Andree Elk earlier this week. Apparently he was able to open up the anterior tibial artery although I have not actually seen his formal report. He is going for an attempt to revascularize on the left on Monday. He is apparently working with an investigational stent for lower extremity arteries below the knee and he has talked to the patient about placing that on Monday if possible. The patient has been using silver alginate on the transmetatarsal amputation site and Santyl on the left 09/30/2018; patient had his revascularization on the left this apparently included a standard approach  as well as a more distal arterial catheterization although I do not have any information on this from Dr. Andree Elk. In fact I do not even see the initial revascularization that he had on the right. I have included the arterial history from Dr. Andree Elk last note however below; ASSESSMENT/PLAN: 1. Hx of Critical limb ischemia bilateral lower extremities, PAD: -s/p transmetatarsal amputation of the RLE. -s/p right ATA percutaneous revascularization procedure x2, most recently 11/20/2016 s/p PTA of right AT 100% to less than 20% with a 2.5 x 200 balloon. -On original angiogram  05/15/2016, he had a significant 70-95% left popliteal artery stenosis with AT and PT artery occlusions and one vessel runoff via the peroneal artery. -03/21/2018 s/p PTA of 90% left popliteal artery to <10% with a 5x20 cutting balloon, PTA of 90% left peroneal to <20% with a 3x20 balloon, PTA of 100% left AT to <20% with a 2.5x220 balloon. 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 Will plan on the LLE soon after. Risks/benefits of procedure have been discussed and patient has elected to proceed. We have been using endoform to the right TMA amputation site wound and Santyl to the left great toe 10/07/2018; the area on the tip of his left great toe looked better we have been using Santyl here. We continue to have a very difficult probing hole on the right TMA amputation site we have been using endoform. I went on to use his fifth Oasis today 10/14/2018; the tip of the left great toe continues to look better. We have been using Santyl here the surface however is healthy and I think we can change to an alginate. The right TMA has not changed. Once again he has no superficial opening there is callus and thick subcutaneous tissue over the orifice once you remove this there is the probing area that we have been dealing with without too much change. There is no palpable bone I have been placing Oasis here and put Oasis #6 in this today after a more vigorous debridement 10/21/18; the left great toe still has necrotic surface requiring debridement. The right TMA site hasn't changed in view of the thick callus over the wound bed. With removal of this there is still the opening however this does not appear to have the same depth. Again there is no palpable bone. Oasis was replaced 10/28/2018; patient comes in with both wounds looking worse. The area over the  first toe tip is now down to bone. The area over the TMA site is deeper and down to bone clearly with a increase in overall wound area. Equally concerning on the right TMA is the complete absence of a pulse this week which is a change. We are not even able to Doppler this. On the right he has a noncompressible ABI greater than 1.4 11/04/2018. Both wounds look somewhat worse. X-rays showed no osteomyelitis of the right foot but on the left there was underlying osteomyelitis in the left great toe distal phalanx. I been on the phone to Dr. Andree Elk surface at [facility] in [place] and they are arranging for another angiogram on the right on Monday. I have him on doxycycline for the osteomyelitis in the left great toe for now. Infectious disease may be necessary 1/17; 2-week hiatus. Patient was admitted to hospital at [place]. My understanding is he underwent an angioplasty of  the right anterior tibial artery and had stents placed in the left anterior artery and the left tibial peroneal trunk. This was done by Dr. Andree Elk of interventional radiology. There is no major change in either 1 of the wounds. They have been using Aquacel Ag. As far as they are aware no imaging studies were done of the foot which is indeed unfortunate. I had him on doxycycline for 2 weeks since we identified the osteomyelitis in the left great toe by plain x-ray. I have renewed that again today. I am still suspicious about osteomyelitis in the amputation site and would consider doing another MRI to compare with the one done in September. The idea of hyperbaric oxygen certainly comes up for discussion 1/24; no major change in either wound area. I have him on doxycycline for osteomyelitis at the tip of the left great toe. As noted he has been previously and recently revascularized by Dr. Andree Elk at [place]. He is tolerating the doxycycline well. For some reason we do not have an infectious disease consult yet. Culture of drainage from  the right foot site last week was negative 1/31; MRI of the right foot did not show osteomyelitis of the right ankle and foot. Notable for a skin ulceration overlying the second metatarsal stump with generalizing soft tissue edema of the ankle and foot consistent with cellulitis. Noted to have a partial-thickness tear of the Achilles tendon 7.5 cm proximal to the insertion Nothing really new in terms of symptoms. Patient's area on the tip of the left great toe is just about closed although he still has the probing area on the metatarsal amputation site. Appointment with Dr. Linus Salmons of infectious disease next week 2/7; Dr. Novella Olive did not feel that any further antibiotics were necessary he would follow-up in 2 months. The left great toe appears to be closed still some surface callus that I gently looked under high did not see anything open or anything that was threatening to be open. He still has the open area on the mid part of his TMA site using endoform 2/14; left great toe is closed and he is completing his doxycycline as of last Sunday. He is not on any antibiotics. Unfortunately out of the right foot his wife noticed some subdermal hemorrhage this week. He had been walking 2 miles I had given him permission to do so this is not really a plantar wound. Using endoform to this wound but I changed to silver alginate this week 2/21; left great toe remains closed. Culture last week grew Streptococcus angiosis which I am not really familiar with however it is penicillin sensitive and I am going to put him on Augmentin. Not much change in the wound on the right foot the deep area is still probing precariously close to bone and the wound on the margin of the TMA is larger. 2/28; left great toe remains closed. He is completing the Augmentin I gave him last week. 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 This is being shown to Dr. Andree Elk at [place] to see if there  is anything else that can be done here. He has been using silver alginate strips on the right 3/6; left great toe remains closed. He sees Dr. Andree Elk on Monday. The area on the plantar aspect of the right foot has the same small orifice with thick callused tissue around this. However this time with removal of the callus tissue the wound is open all the way along the  incision line to the end medially. We have been using silver alginate. 3/13; left toe remains closed although the area is callused. He sees Dr. Jacqualyn Posey of podiatry next week. The area on the plantar right foot looked a lot better this week. Culture I did of this was negative we use silver alginate. He is going next week for an attempt at revascularization by Dr. Andree Elk 3/23; left toe remains closed although the area is callused. He will see Dr. Jacqualyn Posey in follow-up. The area on the right plantar foot continues to look surprisingly better over the last 3 visits. We have been using silver alginate. The revascularization he was supposed to have by Dr. Andree Elk at [facility] in [place] has been canceled [facility] procedure] 4/6; the right foot remains closed albeit callused. Podiatry canceled the appointment with regards to the left great toe. He has not seen Dr. Andree Elk at [facility] but thinks that Dr. Andree Elk has "done all he can do". He would be a candidate for the hemostaemix trial Readmission 07/29/2019 Mr. Stann Mainland is a man we know well from at least 3 previous stays in this clinic. He is a type II diabetic with severe PAD followed by Dr. Andree Elk at [facility] in [place]. 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 So far everything in both of these areas has remained closed. About 2 weeks ago he went to see his podiatrist [facility] Dr. Babs Bertin. He had a thick callus on the left fifth metatarsal head that was  shaved. He has developed an open wound in this area. They have been offloading this in his diabetic shoes. The patient has not had any more revascularizations by Dr. Andree Elk since the last time he was here. ABI in our clinic at the posterior tibial on the left was 1.06 10/6; no real change in the area on the plantar met head. Small wound with 2 mm of depth. He has thick skin probably from pressure around the wound. We have been using silver alginate 10/13; small wound in the left fifth plantar met head. Arrives today with undermining laterally and purulent drainage. Our intake nurse cultured this. His wife stated they noticed a change in color over the last day or 2. He is not systemically unwell 10/19; small wound on the fifth plantar metatarsal head. Culture I did last week showed Staphylococcus lugdunensis. Although this could be a skin contaminant the possibility of a skin and soft tissue infection was there. I did give him empiric doxycycline which should have covered this. We are using silver alginate to the wound. We put him in a total contact cast today. 10/22; small wound on the fifth plantar metatarsal head. He has completed antibiotics. He also has severe PAD which worries me about just about any wound on this man's foot 10/29; small superficial area on the fifth plantar metatarsal head. Measuring slightly smaller. He is not currently on any antibiotics. I been using a total contact cast in this man with very severe PAD but he seems to be tolerating this well Electronic Signature(s) Signed: 09/01/2019 8:24:05 AM By: [name] MD Entered By: [name] on 08/29/2019 08:12:32 -------------------------------------------------------------------------------- Physical Exam Details Patient Name: Date of Service: JHOEL, STIEG 08/28/2019 10:30 AM Medical Record [id number] Patient Account Number: [id number] Date of Birth/Sex: 09-17-1946 (73 y.o. M) Treating RN: Deon Pilling Primary  Care Provider: [name] Other Clinician: Referring Provider: Treating Provider/Extender:Audrea Bolte, [name], [name] in Treatment: 4 Constitutional Sitting or standing Blood Pressure is within target range for patient.. Pulse regular and within target range for patient.Marland Kitchen Respirations regular, non-labored and within target range.. Temperature is normal and within the target range for the patient.Marland Kitchen Appears in no distress. Notes Wound exam; no difference in the wound so far in terms of appearance although it is measuring slightly smaller. There is no surrounding erythema the foot does not appear to be acutely ischemic Electronic Signature(s) Signed: 09/01/2019 8:24:05 AM By: [name] MD Entered By: [name] on 08/29/2019 08:13:19 -------------------------------------------------------------------------------- Physician Orders Details Patient Name: Date of Service: TURRELL, SEVERT 08/28/2019 10:30 AM Medical Record [id number] Patient Account Number: [id number] Date of Birth/Sex: [birth year] (73 y.o. M) Treating RN: Deon Pilling Primary Care Provider: [name] Other Clinician: Referring Provider: Treating Provider/Extender:Isom Kochan, [name], [name] in Treatment: 4 Verbal / Phone Orders: No Diagnosis Coding ICD-10 Coding Code Description E11.621 Type 2 diabetes mellitus with foot ulcer E11.51 Type 2 diabetes mellitus with diabetic peripheral angiopathy without gangrene L97.521 Non-pressure chronic ulcer of other part of left foot limited to breakdown of skin L03.116 Cellulitis of left lower limb Follow-up Appointments Return Appointment in 1 week. Dressing Change Frequency Wound #5 Left Metatarsal head fifth Do not change entire dressing for one week. Wound Cleansing Wound #5 Left Metatarsal head fifth May shower with protection. Primary Wound Dressing Wound #5 Left Metatarsal head fifth Calcium Alginate with  Silver Secondary Dressing Wound #5 Left Metatarsal head fifth Foam - pad the great toe. Dry Gauze Drawtex Off-Loading Total Contact Cast to Left Lower Extremity Electronic Signature(s) Signed: 08/28/2019 3:04:52 PM By: Deon Pilling Signed: 09/01/2019 8:24:05 AM By: [name] MD Entered By: Deon Pilling on 08/28/2019 12:01:17 -------------------------------------------------------------------------------- Problem List Details Patient Name: Date of Service: TARRENCE, ENCK 08/28/2019 10:30 AM Medical Record [id number] Patient Account Number: [id number] Date of Birth/Sex: 1945/12/03 (73 y.o. M) Treating RN: Deon Pilling Primary Care Provider: [name] Other Clinician: Referring Provider: Treating Provider/Extender:Abrielle Finck, [name], [name] in Treatment: 4 Active Problems ICD-10 Evaluated Encounter Code Description Active Date Today Diagnosis E11.621 Type 2 diabetes mellitus with foot ulcer 07/29/2019 No Yes E11.51 Type 2 diabetes mellitus with diabetic peripheral 07/29/2019 No Yes angiopathy without gangrene L97.521 Non-pressure chronic ulcer of other part of left foot 07/29/2019 No Yes limited to breakdown of skin L03.116 Cellulitis of left lower limb 08/12/2019 No Yes Inactive Problems Resolved Problems Electronic Signature(s) Signed: 09/01/2019 8:24:05 AM By: [name] MD Previous Signature: 08/28/2019 3:04:52 PM Version By: Deon Pilling Entered By: [name] on 08/29/2019 08:09:21 -------------------------------------------------------------------------------- Progress Note Details Patient Name: Date of Service: PERRION, DIESEL 08/28/2019 10:30 AM Medical Record [id number] Patient Account Number: [id number] Date of Birth/Sex: [birth year] (73 y.o. M) Treating RN: Deon Pilling Primary Care Provider: [name] Other Clinician: Referring Provider: Treating Provider/Extender:Laurna Shetley, [name], [name] in Treatment:  4 Subjective History of Present Illness (HPI) 05/18/16; this is a 73year-old diabetic who is a type II diabetic on insulin. The history is that he traumatized his right foot developed a sore sometime in late March. Shortly thereafter he went on a cruise but he had to get off the cruise ship in [place] and fly urgently back to [place] where he was admitted to [facility] and ultimately underwent a transmetatarsal amputation by Dr. 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 According to the patient and his wife this wound never really healed. He  was seen on 2 occasions in the wound care center in [place] and had vascular studies and then was referred urgently to Dr. Bridgett Larsson of vascular surgery. He underwent an angiogram on 05/10/16. Unfortunately nothing really could be done to improve his vascular status. He had a 75-90% stenosis in the midsegment of 1 segment of the posterior femoral artery. He had a patent popliteal, his anterior tibial occluded shortly after takeoff. Perineal had a greater than 90% stenosis posterior tibial is occluded feet had no distal collaterals feed distal aspect of the transmetatarsal amputation site. The patient tells me that he had a prolonged period of Santyl by Dr. Doran Durand was some initial improvement but then this was stopped. I think they're only applying daily dressings/dry dressings. He has not had a recent x-ray of the right foot he did have one before his surgery in April. His wife by the dimensions of the wound/surgical site being followed at home since 4/20. At that point the dimensions were 0.5 x 12 x 0.2 on 7/19 this was 1.8 x 6 x 0.4. He is not currently on any antibiotics. His hemoglobin A1c in early April was 12.9 at that point he was started on insulin. Apparently his blood sugars are much lower he has an appointment with Dr. Legrand Como Alteimer of endocrine next week. 05/29/16 x-ray of the area did not show osteomyelitis. I think he probably needs an MRI  at this point. His wife is asking about something called"Yireh" cream which is not FDA approved. I have not heard of this. 06/22/16; MRI did not really suggest osteomyelitis. There was minimal marrow edema and enhancement in the stump of the second metatarsal felt to be secondary likely to postoperative change rather than osteomyelitis. The patient has arranged his own consultation with Dr. Andree Elk at [place], apparently their daughter lives in [place] and has some connection here. Any improvement in vascular supply by Dr. Andree Elk would of course be helpful. Dr. Bridgett Larsson did not feel that anything further could be done other than amputation if wound care did not result in healing or if the area deteriorates. 06/26/16; the patient has been to see Dr. Andree Elk at [place] and had an angiogram. He is going for a procedure on Thursday which will involve catheterization. I'm not sure if this is an anterograde or retrograde approach. He has been using Santyl to the wound 07/10/16; the patient had a repeat angiogram and angioplasty at [place] by Dr. Brunetta Jeans. His angiogram showed right CFA and profundal widely patent. The right as of a.m. popliteal artery were widely patent the right anterior tibial was occluded proximally and reconstitutes at the ankle. Peroneal artery was patent to the foot. Posterior tibial artery was occluded. The patient had angioplasty of the anterior tibial artery.. This was quite successful. He was recommended for Plavix as well as aspirin. 07/17/16; the patient was close to be a nurse visit today however the outer dressing of the Apligraf fell off. Noted drainage. I was asked to see the wound. The patient is noted an odor however his wife had noted that. Drainage with Apligraf not necessarily a bad thing. He has not been systemically unwell 07/24/16; we are still have an issue with drainage of this wound. In spite of this I applied his second Apligraf. Medially the area still is probing to  bone. 08/07/16; Apligraf reapplied in general wound looks improved. 08/21/16 Apligraf #4. Wound looks much better 09/04/16 patientt's wound again today continues to appear to improve with the application of the  Apligraf's. He notes no increased discomfort or concerns at this point in time. 09/18/16; the patient returns today 2 weeks after his fifth application of Apligraf. Predictably three quarters of the width of this wound has healed. The deep area that probe to bone medially is still open. The patient asked how much out-of-pocket dollars would be for additional Apligraf's. 09/25/16; now using Hydrofera Blue. He has completed 5 Apligraf applications with considerable improvement in this deep open transmetatarsal amputation site. His wound is now a triangular-shaped wound on the medial aspect. At roughly 12 to 2:00 this probes another centimeter but as opposed to in the past this does not probe to bone. The patient has been seen at [place] by Dr. Zenia Resides. He is not planning to do any more revascularization unless the wound stalls or worsens per the patient 10/02/16; 0.7 x 0.8 x 0.8. Unfortunately although the wound looks stable to improved. There is now easily probable bone. This hasn't been present for several weeks. Patient is not otherwise symptomatic he is not experiencing any pain. I did a culture of the wound bed 10/09/16. Deterioration last week. Culture grew MRSA and although there is improvement here with doxycycline prescribed over the phone I'm going to try to get him linezolid 600 twice a day for 10 days today. 10/16/16; he is completing a weeks worth of linezolid and still has 3 more days to go. Small triangular-shaped open area with some degree of undermining. There is still palpable bone with a curet. Overall the area appears better than last week 10/20/16 he has completed the linezolid still having some nausea and vomiting but no diarrhea. He has exposed bone this week which is a  deterioration. 10/27/16 patient now has a small but probing wound down to bone. Culture of this bone that I did last week showed a few methicillin-resistant staph aureus. I have little doubt that this represents acute/subacute osteomyelitis. The patient is currently on Doxy which I will continue he also completed 10 days of linezolid. We are now in a difficult situation with this patient's foot after considerable discussion we will send him back to see Dr. Doran Durand for a surgical opinion of this I'm also going to try to arrange a infectious disease consult at [facility] hopefully week and get this prior to her usual 4-6 weeks we having [place]. The patient clearly is going to need 6 weeks of IV vancomycin. If we cannot arrange this expediently I'll have to consider ordering this myself through a home infusion company 11/03/16; the patient now has a small in terms of circumference but probing wound. No bone palpable today. The patient remains on doxycycline 100 twice a day which should support him until he sees infectious disease at [facility] next week the following week on Wednesday I believe he has an appointment with Dr. Andree Elk at [facility] who is his vascular cardiologist. Finally he has an appointment with Dr. Doran Durand on 11/22/16 we have been using silver alginate. The patient's wife states they are having trouble getting this through [facility] 11/13/16; the patient was seen by infectious disease at [facility] in the 11th PICC line placed in preparation for IV antibiotics. A tummy he has not going to get IV vancomycin o Ceftaroline. They've also ordered an MRI. Patient has a follow-up with Dr. Doran Durand on 11/22/16 and Dr. Andree Elk at [facility] tomorrow 11/23/16 the patient is on daptomycin as directed by infectious disease at [facility]. He is also been back to see Dr. Andree Elk at [facility]. He  underwent a repeat arteriogram. He had a successful PTA of the right anterior tibial artery. He is on dual antiplatelete  treatment with Plavix and aspirin. Finally he had the MRI of his foot in [place]. This showed cellulitis about the foot worse distally edema and enhancement in the reminiscent of the second metatarsal was consistent with osteomyelitis therefore what I was assuming to be the first metatarsal may be actually the second. He also has a fluid collection deep to the calcaneus at the level of the calcaneal spur which could be an abscess or due to adventitial bursitis. He had a small tear in his Achilles 11/30/16; the patient continues on daptomycin as directed by infectious disease at [facility]. He is been revascularized by Dr. Andree Elk at [place] in [place]. He has been to see Gretta Arab who was the orthopedic surgeon who did his original amputation. I have not seen his not however per the patient's wife he did not offer another surgical local surgical prodecure to remove involved bone. He verbalized his usual disbelief in not just hyperbarics but any medical therapy for this condition(osteomyelitis). I discussed this in detail with the patient today including answering the question about a BKA definitively "curing" the current condition. 12/07/16; the patient continues on daptomycin as directed by infectious disease at [facility]. This is directed at the MRSA that we cultured from his bone debridement from 12/22. Lab work today shows a white count of 8.7 hemoglobin of 10.5 which is microcytic and hypochromic differential count shows a slightly elevated monocyte count at 1.2 eosinophilic count of 0.6. His creatinine is 1.11 sedimentation rate apparently is gone from 35-34 now 40. I explained was wife I don't think this represents a trend. His total CK is 48 12/14/16- patient is here for follow-up evaluation of his right TMA site. He continues to receive IV daptomycin per infectious disease. His serum inflammatory markers remain elevated. He complains of intermittent pain to the medial aspect of the TMA site  with intermittent erythema. He voices no complaints or concerns regarding hyperbaric therapy. Overall he and his wife are expressing a frustration and discouragement regarrding the length of time of treatment. 12/21/16; small open wound at roughly the first or second metatarsal metatarsalphalyngeal joint reminiscence of his transmetatarsal amputation site he continues to receive IV daptomycin per infectious disease at [facility]. He will finish these a week tomorrow. He has lab work which I been copied on. His white count is 10.8 hemoglobin 8.9 MCV is low at 75., MCH low at 24.5 platelet count slightly elevated at 626. Differential count shows 70% neutrophils 10% monocytes and 10% eosinophils. His comprehensive metabolic panel shows a slightly low sodium at 133 albumin low at 3.1 total CK is normal at 42 sedimentation rate is much higher at 82. He has had iron studies that show a serum iron of 15 and iron binding capacity of 238 and iron saturation of 6. This is suggestive of iron deficiency. B12 and folate were normal ferritin at 113 The patient tells me that he is not eating well and he has lost weight. He feels episodically nauseated. He is coughing and gagging on mucus which she thinks is sinusitis. He has an appointment with his primary doctor at 5:00 this afternoon in [facility] 01/02/17; the patient developed a subacute pneumonitis. He was admitted to [facility] after a CT scan showed an extensive interstitial pneumonitis [I have not yet seen this]. 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 He has since been discharged. He is not on oxygen. He feels fatigued and very short of breath with exertion. At [facility] the wound care nurse there felt that his wound was healed. He did complete his daptomycin and has follow-up with infectious disease on Friday. X-rays I did before he went to [facility]  still suggested residual osteomyelitis in the anterior aspect of the second must metatarsal head. I'm not sure I would've expected any different. There was no other findings. I actually think I did this because of erythema over the first metatarsal head reminiscent 01/11/17; the patient has eosinophilic pneumonitis. He has been reviewed by pulmonology and given clearance for hyperbarics at least that's what his wife says. I'll need to see if there is note in care everywhere. Apparently the prognosis for improvement of daptomycin induced eosinophilic granulocyte is is 3 months without steroids. In the meantime infectious disease has placed him on doxycycline until the wound is closed. He is still is lost a lot of weight and his blood sugars are running in the mid 60s to low 80s fasting and at all times during the day 01/18/17; he has had adjustments in his insulin apparently his blood sugars in the morning or over 100. He wants to restart his hyperbaric treatment we'll do this at 1:00. 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 I think there is good reason to complete his treatments in order to give him the best chance of maintaining a healed status and these DFU 3 wounds with MRSA infection in the bone 02/15/17; the patient was seen today in conjunction with HBO. He completed hyperbaric oxygen today. The open area on his transmetatarsal site has remained closed. There was an area of erythema when I saw him earlier in the week on the posterior heel although that is resolved as of today as well. He has been using a cam walker. This is a patient who came to [place] after a transmetatarsal amputation that was necrotic and dehisced. He required revascularization percutaneously on 2 different occasions by Dr. Andree Elk of invasive cardiology at [facility]. 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 He went to [facility] infectious disease and had IV daptomycin for 5 weeks before developing eosinophilic pneumonitis and requiring an admission to hospital/ICU. He made a good recovery and is continued on doxycycline since. As mentioned his foot is closed now. He has a follow-up with Dr. Andree Elk tomorrow. He is going to [employer] on Monday for a custom-made shoe READMISSION Last visit Dr. Andree Elk [facility] 02/16/17 1. Critical limb ischemia of the RLE:  s/p transmetatarsal amputation of the RLE, now completely healed.  s/p right ATA percutaneous revascularization procedure x2, most recently 11/20/2016 s/p PTA of right AT 100% to less than 20% with a 2.5 x 200 balloon.  He does have some residual osteomyelitis, but given the wound is closed, the orthopedist has recommended to follow. He has a fitting for a special shoe coming up next week. He has completed a course of daptomycin and doxycycline and he has been discharged by ID. He has completed a course of hyperbaric therapy.  Continue Continue medical management with aspirin, Plavix and statin therapy. We will discuss ongoing Plavix therapy at follow-up in 6 months.  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 Given no symptoms, we will conservatively manage. He doesn't want to do any more invasive studies at this time, which is reasonable. The patient arrives today out of 2 concerns both on the right transmetatarsal site. 1 at the level of the reminiscent fifth metatarsal head and the other at roughly the first or second. Both of these look like dark subcutaneous discoloration probably subdermal bleeding. He is recently obtained new adaptive footwear for the right foot. He also has a callus on the left fifth dorsal toe however he follows with  podiatry for this and I don't think this is any issue. ABIs in this clinic today were 0.66 on the right and 1.06 on the left. On entrance into our clinic initially this was 0.95 and 0.92. He does not describe current claudication. They're going away on a cruise in 8 weeks and I think are trying to do the month is much as they can proactively. They follow with podiatry and have an appointment with Dr. Andree Elk in October READMISSION 06/25/18 This is a patient that we have not seen in almost a year. He is a type II diabetic with known PAD. He is followed by Dr. Andree Elk of interventional cardiology at [facility] in [place]. He is required revascularization for significant PAD. When we first saw him he required a transmetatarsal amputation. He had underlying osteomyelitis with a nonhealing surgical wound. This eventually closed with wound care, IV antibiotics and hyperbaric oxygen. They tell me that he has a modified shoe and he is very active walking up to 4 miles a day. He is followed by Dr. Geroge Baseman of podiatry. His wife states that he underwent a removal of callus over this site on July 9. She felt there may be drainage from this site after that although she could never really determined and there was callus buildup again. On 06/01/18 there was pressure bleeding through the overlying callus. he was given a prescription for 7 days of Bactrim. Fortuitously he has an appointment with Dr. Andree Elk on 06/04/18 and he immediately underwent revascularization of the right leg although I have not had a chance to review these records in care everywhere. This was apparently done through anterior and retrograde access. On 06/06/18 he had another debridement by Dr. Bernette Mayers. Vitamin soaking with Epsom salts for 20 minutes and applying calcium alginate. The original trans-met was on April 2017 I believe by Dr. Doran Durand. His ABI in our clinic was noncompressible today. 07/02/18; x-ray I ordered last week was negative for  osteomyelitis. Swab culture was also negative. He is going to require an MRI which I have ordered today. 07/09/18; surprisingly the MRI of the foot that I ordered did not show osteomyelitis. He did suggest the possibility of cellulitis. For this reason I'll go ahead and give him a 10 day course of doxycycline. Although the previous culture of this area was negative 07/16/18; it arrives with the wound looking much the same. Roughly the same depth. He has thick subcutaneous tissue around the wound orifice but this still has roughly the same depth. Been using silver alginate. We applied Oasis #1 today 07/23/2018; still having to remove a lot of callus and thick subcutaneous tissue to actually define the wound here. Most of this seems to have closed down yet he has a comma shaped divot over the top of the area that still I think is open. We applied Oasis #2 His wife expressed  concern about the tip of his left great toe. This almost looks like a small blister. She also showed it today to her podiatrist Dr. Geroge Baseman who did not think this was anything serious. I am not sure is anything serious either however I think it bears some watching. He is not in any pain however he is insensate 07/30/2018;; still on a lot of nonviable tissue over the surface of the wound however cleaning this up reveals a more substantial wound orifice but less of a probing wound depth. This is not probed to bone. There is no evidence of infection The wife is still concerned about a non-open area on the tip of his left great toe. Almost feels like a bony outgrowth. She had previously showed this to podiatry. I do not think this is a blister. A friction area would be possible although he is really not walking according to his wife. He had a small skin tag on his right buttock but no open wound here either 08/06/2018; we applied a third Oasis last week. Unfortunately there is really no improvement. Still requiring  extensive debridement to expose the wound bed from a horizontal slitlike depression. I still have not been able to get this to fill in properly. On the positive side there is now no probable bone from when he first came into the facility. I changed him to silver alginate today after a reasonably aggressive debridement 08/13/2018; once again the patient comes in with skin and subcutaneous tissue closing over the small probing area with the underlying cavity of the wound on the right TMA site. I applied silver alginate to this last week. Prior to that we used Oasis x3 still not able to get this area granulating. He does not have a probing area of the bone which is an improvement from when I spur started working on this and an MRI did not suggest osteomyelitis. I do not see evidence of infection here but I am increasingly concerned about why I cannot get this area to granulate. Each time I debrided this is looks like this is simply a matter of getting granulation to fill in the hole and then getting epithelialization. This does not seem to happen Also I sent him back to see podiatry Dr. Earleen Newport about the what felt to be bony outgrowth on the tip of his left great toe. Apparently after heel he left the clinic last week or the next day he developed a blood blister. He did see Dr. Earleen Newport. He went on to have a debridement of the medial nail cuticle he now has an open area here as well as some denuded skin. They did an x-ray apparently does have a bony outgrowth or spur but I am not able to look at this. They are using topical antibiotics apparently there was some suggested he use Santyl. Patient's wife was anxious for my opinion of this 08/20/2018; we are able to keep the wound open this time instead of the thick subcutaneous tissue closing over the top of it however unfortunately once again this probes to bone. I do not see any evidence of infection and previous MRI did not show osteomyelitis. I elected  to go back to the Oasis to see if we can stimulate some granulation. Last saw his vascular interventional cardiologist Dr. Andree Elk at the beginning of August and he had a repeat procedure. Nevertheless I wonder how much blood flow he has down to this area With regards to the left first toe he is  seeing Dr. Earleen Newport next week. They are applying Bactroban to this area. 08/27/2018 ooOnce again he comes in with thick eschar and subcutaneous tissue over the top of the small probing hole. This does not appear to go down to bone but it still has roughly the same depth. I reapplied Oasis today ooOver the left great toe there appears to be more of the wound at the tip of his toe than there was last week. This has an eschar on the surface of it. Will change to Santyl. There is seeing podiatry this afternoon 09/03/2018 ooHe comes in today with the area on the transmetatarsal's site with a fair amount of callus, nonviable tissue over the circumference but it was not closed. I removed all of this as well as some subcutaneous debris and reapplied Oasis. There is no exposed bone ooThe area over the tip of the left great toe started off as a nodule of uncertain etiology. He has been followed with podiatry. They have been removing part of the medial nail bed. He has nonviable tissue over the wound he been using Santyl in this area 09/10/18 ooUnfortunately comes in with neither wound area looking improved. The transmetatarsal amputation site once Again has nonviable debris over the surface requiring debridement. Unfortunately underneath this there is nothing that looks viable and this once again goes right down to bone. There is no purulent drainage and no erythema. ooAlso the surgical wound from podiatry on the left first toe has an ischemic-looking eschar over the surface of the tip of the toe I have elected not to attempt candidly debride this ooHis wife as arranged for him to have follow-up noninvasive  studies in [place] under the care of Dr. Andree Elk clinic. The question is he has known severe PAD. They had recently seen him in August. He has had revascularizations in both legs within the last 4 or 5 months. He is not complaining of pain and I cannot really get a history of claudication. He has not systemically unwell 09/20/2018 the patient has been to [facility] and been revascularized by Dr. Andree Elk earlier this week. Apparently he was able to open up the anterior tibial artery although I have not actually seen his formal report. He is going for an attempt to revascularize on the left on Monday. He is apparently working with an investigational stent for lower extremity arteries below the knee and he has talked to the patient about placing that on Monday if possible. The patient has been using silver alginate on the transmetatarsal amputation site and Santyl on the left 09/30/2018; patient had his revascularization on the left this apparently included a standard approach as well as a more distal arterial catheterization although I do not have any information on this from Dr. Andree Elk. In fact I do not even see the initial revascularization that he had on the right. I have included the arterial history from Dr. Andree Elk last note however below; ASSESSMENT/PLAN: 1. Hx of Critical limb ischemia bilateral lower extremities, PAD: -s/p transmetatarsal amputation of the RLE. -s/p right ATA percutaneous revascularization procedure x2, most recently 11/20/2016 s/p PTA of right AT 100% to less than 20% with a 2.5 x 200 balloon. 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 -03/21/2018 s/p PTA of 90% left popliteal artery to <10% with a 5x20 cutting balloon, PTA of 90% left peroneal to <20% with a 3x20 balloon, PTA  of 100% left AT to <20% with a 2.5x220 balloon. -Considering he has  bilateral lower extremity CLI on the right foot and L great toe, we will plan on abdominal aortogram focusing on the right lower extremity via left common femoral access. Will plan on the LLE soon after. Risks/benefits of procedure have been discussed and patient has elected to proceed. We have been using endoform to the right TMA amputation site wound and Santyl to the left great toe 10/07/2018; the area on the tip of his left great toe looked better we have been using Santyl here. We continue to have a very difficult probing hole on the right TMA amputation site we have been using endoform. I went on to use his fifth Oasis today 10/14/2018; the tip of the left great toe continues to look better. We have been using Santyl here the surface however is healthy and I think we can change to an alginate. The right TMA has not changed. Once again he has no superficial opening there is callus and thick subcutaneous tissue over the orifice once you remove this there is the probing area that we have been dealing with without too much change. There is no palpable bone I have been placing Oasis here and put Oasis #6 in this today after a more vigorous debridement 10/21/18; the left great toe still has necrotic surface requiring debridement. The right TMA site hasn't changed in view of the thick callus over the wound bed. With removal of this there is still the opening however this does not appear to have the same depth. Again there is no palpable bone. Oasis was replaced 10/28/2018; patient comes in with both wounds looking worse. The area over the first toe tip is now down to bone. The area over the TMA site is deeper and down to bone clearly with a increase in overall wound area. Equally concerning on the right TMA is the complete absence of a pulse this week which is a change. We are not even able to Doppler this. On the right he has a noncompressible ABI greater than 1.4 11/04/2018. Both wounds look  somewhat worse. X-rays showed no osteomyelitis of the right foot but on the left there was underlying osteomyelitis in the left great toe distal phalanx. I been on the phone to Dr. Andree Elk surface at [facility] in [place] and they are arranging for another angiogram on the right on Monday. I have him on doxycycline for the osteomyelitis in the left great toe for now. Infectious disease may be necessary 1/17; 2-week hiatus. Patient was admitted to hospital at [place]. My understanding is he underwent an angioplasty of the right anterior tibial artery and had stents placed in the left anterior artery and the left tibial peroneal trunk. This was done by Dr. Andree Elk of interventional radiology. There is no major change in either 1 of the wounds. They have been using Aquacel Ag. As far as they are aware no imaging studies were done of the foot which is indeed unfortunate. I had him on doxycycline for 2 weeks since we identified the osteomyelitis in the left great toe by plain x-ray. I have renewed that again today. I am still suspicious about osteomyelitis in the amputation site and would consider doing another MRI to compare with the one done in September. The idea of hyperbaric oxygen certainly comes up for discussion 1/24; no major change in either wound area. I have him on doxycycline for osteomyelitis at the tip of the left  great toe. As noted he has been previously and recently revascularized by Dr. Andree Elk at [place]. He is tolerating the doxycycline well. For some reason we do not have an infectious disease consult yet. Culture of drainage from the right foot site last week was negative 1/31; MRI of the right foot did not show osteomyelitis of the right ankle and foot. Notable for a skin ulceration overlying the second metatarsal stump with generalizing soft tissue edema of the ankle and foot consistent with cellulitis. Noted to have a partial-thickness tear of the Achilles tendon 7.5 cm proximal to the  insertion Nothing really new in terms of symptoms. Patient's area on the tip of the left great toe is just about closed although he still has the probing area on the metatarsal amputation site. Appointment with Dr. Linus Salmons of infectious disease next week 2/7; Dr. Novella Olive did not feel that any further antibiotics were necessary he would follow-up in 2 months. The left great toe appears to be closed still some surface callus that I gently looked under high did not see anything open or anything that was threatening to be open. He still has the open area on the mid part of his TMA site using endoform 2/14; left great toe is closed and he is completing his doxycycline as of last Sunday. He is not on any antibiotics. Unfortunately out of the right foot his wife noticed some subdermal hemorrhage this week. He had been walking 2 miles I had given him permission to do so this is not really a plantar wound. Using endoform to this wound but I changed to silver alginate this week 2/21; left great toe remains closed. Culture last week grew Streptococcus angiosis which I am not really familiar with however it is penicillin sensitive and I am going to put him on Augmentin. Not much change in the wound on the right foot the deep area is still probing precariously close to bone and the wound on the margin of the TMA is larger. 2/28; left great toe remains closed. He is completing the Augmentin I gave him last week. 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 This is being shown to Dr. Andree Elk at [place] to see if there is anything else that can be done here. He has been using silver alginate strips on the right 3/6; left great toe remains closed. He sees Dr. Andree Elk on Monday. The area on the plantar aspect of the right foot has the same small orifice with thick callused tissue around this. However this time with removal of the callus tissue the wound is open all the way along the incision  line to the end medially. We have been using silver alginate. 3/13; left toe remains closed although the area is callused. He sees Dr. Jacqualyn Posey of podiatry next week. The area on the plantar right foot looked a lot better this week. Culture I did of this was negative we use silver alginate. He is going next week for an attempt at revascularization by Dr. Andree Elk 3/23; left toe remains closed although the area is callused. He will see Dr. Jacqualyn Posey in follow-up. The area on the right plantar foot continues to look surprisingly better over the last 3 visits. We have been using silver alginate. The revascularization he was supposed to have by Dr. Andree Elk at [facility] in [place] has been canceled [employer] procedure] 4/6; the right foot remains closed albeit callused. Podiatry canceled the appointment with regards to the left great toe.  He has not seen Dr. Andree Elk at [facility] but thinks that Dr. Andree Elk has "done all he can do". He would be a candidate for the hemostaemix trial Readmission 07/29/2019 Mr. Stann Mainland is a man we know well from at least 3 previous stays in this clinic. He is a type II diabetic with severe PAD followed by Dr. Andree Elk at [facility] in [place]. 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 So far everything in both of these areas has remained closed. About 2 weeks ago he went to see his podiatrist at [facility] Dr. Babs Bertin. He had a thick callus on the left fifth metatarsal head that was shaved. He has developed an open wound in this area. They have been offloading this in his diabetic shoes. The patient has not had any more revascularizations by Dr. Andree Elk since the last time he was here. ABI in our clinic at the posterior tibial on the left was 1.06 10/6; no real change in the area on the plantar met head. Small wound with 2 mm of depth. He has thick  skin probably from pressure around the wound. We have been using silver alginate 10/13; small wound in the left fifth plantar met head. Arrives today with undermining laterally and purulent drainage. Our intake nurse cultured this. His wife stated they noticed a change in color over the last day or 2. He is not systemically unwell 10/19; small wound on the fifth plantar metatarsal head. Culture I did last week showed Staphylococcus lugdunensis. Although this could be a skin contaminant the possibility of a skin and soft tissue infection was there. I did give him empiric doxycycline which should have covered this. We are using silver alginate to the wound. We put him in a total contact cast today. 10/22; small wound on the fifth plantar metatarsal head. He has completed antibiotics. He also has severe PAD which worries me about just about any wound on this man's foot 10/29; small superficial area on the fifth plantar metatarsal head. Measuring slightly smaller. He is not currently on any antibiotics. I been using a total contact cast in this man with very severe PAD but he seems to be tolerating this well Objective Constitutional Sitting or standing Blood Pressure is within target range for patient.. Pulse regular and within target range for patient.Marland Kitchen Respirations regular, non-labored and within target range.. Temperature is normal and within the target range for the patient.Marland Kitchen Appears in no distress. Vitals Time Taken: 11:37 AM, Height: 69 in, Source: Stated, Weight: 210 lbs, Source: Stated, BMI: 31, Temperature: 97.7 F, Pulse: 77 bpm, Respiratory Rate: 18 breaths/min, Blood Pressure: 115/69 mmHg, Capillary Blood Glucose: 120 mg/dl. General Notes: glucose per pt report this am General Notes: Wound exam; no difference in the wound so far in terms of appearance although it is measuring slightly smaller. There is no surrounding erythema the foot does not appear to be acutely  ischemic Integumentary (Hair, Skin) Wound #5 status is Open. Original cause of wound was Gradually Appeared. The wound is located on the Left Metatarsal head fifth. The wound measures 0.7cm length x 1.3cm width x 0.1cm depth; 0.715cm^2 area and 0.071cm^3 volume. There is Fat Layer (Subcutaneous Tissue) Exposed exposed. There is no tunneling or undermining noted. There is a small amount of serous drainage noted. The wound margin is thickened. There is small (1-33%)  pink granulation within the wound bed. There is a large (67-100%) amount of necrotic tissue within the wound bed including Adherent Slough. 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 There was a Total Contact Cast Procedure by Ricard Dillon., MD. Post procedure Diagnosis Wound #5: Same as Pre-Procedure Plan Follow-up Appointments: Return Appointment in 1 week. Dressing Change Frequency: Wound #5 Left Metatarsal head fifth: Do not change entire dressing for one week. Wound Cleansing: Wound #5 Left Metatarsal head fifth: May shower with protection. Primary Wound Dressing: Wound #5 Left Metatarsal head fifth: Calcium Alginate with Silver Secondary Dressing: Wound #5 Left Metatarsal head fifth: Foam - pad the great toe. Dry Gauze Drawtex Off-Loading: Total Contact Cast to Left Lower Extremity 1. Silver alginate under the total contact cast. Not as much improvement this week as I might of like. Consider changing to [facility] next week if there is no measurable improvement. 2. The wound does not appear to be infected currently. 3. I am concerned  about using the total contact cast in a man with PAD of this of severity however I do not believe there is another way to heal this. In terms of revascularization I am not sure that he has any more options from Dr. Andree Elk at [facility]) Signed: 09/01/2019 8:24:05 AM By: [name] MD Entered By: [name] on 08/29/2019 08:14:35 -------------------------------------------------------------------------------- Total Contact Cast Details Patient Name: Date of Service: MCKENZIE, TORUNO 08/28/2019 10:30 AM Medical Record [id number] Patient Account Number: [id number] Date of Birth/Sex: 06/05/1946 (72 y.o. M) Treating RN: Deon Pilling Primary Care Provider: [name] Other Clinician: Referring Provider: Treating Provider/Extender:Yakov Bergen, [name], [name] in Treatment: 4 Total Contact Cast Applied for Wound Assessment: Wound #5 Left Metatarsal head fifth Performed By: Physician Ricard Dillon., MD Post Procedure Diagnosis Same as Pre-procedure Electronic Signature(s) Signed: 09/01/2019 8:24:05 AM By: [name] MD Previous Signature: 08/28/2019 3:04:52 PM Version By: Deon Pilling Entered By: [name] on 08/29/2019 08:09:51 -------------------------------------------------------------------------------- [facility] Details Patient Name: Date of Service: JAYVION, STEFANSKI 08/28/2019 Medical Record [id number] Patient Account Number: [id number] Date of Birth/Sex: Treating RN: 09-17-1946 (73 y.o. Lorette Ang, Meta.Reding Primary Care Provider: [name] Other Clinician: Referring Provider: Treating Provider/Extender:Candy Leverett, [name], [name] in Treatment: 4 Diagnosis Coding ICD-10 Codes Code Description E11.621 Type 2 diabetes mellitus with foot ulcer E11.51 Type 2 diabetes mellitus with diabetic peripheral angiopathy without gangrene L97.521 Non-pressure chronic ulcer of other part of left foot limited to breakdown of  skin L03.116 Cellulitis of left lower limb Facility Procedures CPT4 Code: [id number] Description: 29445 - APPLY TOTAL CONTACT LEG CAST ICD-10 Diagnosis Description E11.621 Type 2 diabetes mellitus with foot ulcer Modifier: Quantity: 1 Physician Procedures CPT4 Code: [id number] Description: 63016 - WC PHYS APPLY TOTAL CONTACT CAST ICD-10 Diagnosis Description E11.621 Type 2 diabetes mellitus with foot ulcer Modifier: Quantity: 1 Electronic Signature(s) Signed: 09/01/2019 8:24:05 AM By: [name] MD Previous Signature: 08/28/2019 3:04:52 PM Version By: Deon Pilling Entered By: [name] on 08/29/2019 08:14:49

## 2019-09-04 ENCOUNTER — Other Ambulatory Visit: Payer: Self-pay

## 2019-09-04 ENCOUNTER — Encounter (HOSPITAL_BASED_OUTPATIENT_CLINIC_OR_DEPARTMENT_OTHER): Payer: Medicare Other | Attending: Internal Medicine | Admitting: Internal Medicine

## 2019-09-04 DIAGNOSIS — Z794 Long term (current) use of insulin: Secondary | ICD-10-CM | POA: Diagnosis not present

## 2019-09-04 DIAGNOSIS — Z89431 Acquired absence of right foot: Secondary | ICD-10-CM | POA: Diagnosis not present

## 2019-09-04 DIAGNOSIS — E1151 Type 2 diabetes mellitus with diabetic peripheral angiopathy without gangrene: Secondary | ICD-10-CM | POA: Insufficient documentation

## 2019-09-04 DIAGNOSIS — E11621 Type 2 diabetes mellitus with foot ulcer: Secondary | ICD-10-CM | POA: Insufficient documentation

## 2019-09-04 DIAGNOSIS — L03116 Cellulitis of left lower limb: Secondary | ICD-10-CM | POA: Diagnosis not present

## 2019-09-04 DIAGNOSIS — L97521 Non-pressure chronic ulcer of other part of left foot limited to breakdown of skin: Secondary | ICD-10-CM | POA: Diagnosis not present

## 2019-09-04 NOTE — Progress Notes (Signed)
Todd, Guzman ([id number]) Visit Report for 09/04/2019 Debridement Details Patient Name: Date of Service: Todd, Guzman 09/04/2019 9:45 AM Medical Record [id number] Patient Account Number: [id number] Date of Birth/Sex: 09/07/1946 (73 y.o. M) Treating RN: Deon Pilling Primary Care Provider: [name] Other Clinician: Referring Provider: Treating Provider/Extender:Kiaira Pointer, [name], [name] in Treatment: 5 Debridement Performed for Wound #5 Left Metatarsal head fifth Assessment: Performed By: Physician Ricard Dillon., MD Debridement Type: Debridement Severity of Tissue Pre Fat layer exposed Debridement: Level of Consciousness (Pre- Awake and Alert procedure): Pre-procedure Verification/Time Out Taken: Yes - 10:57 Start Time: 10:57 Pain Control: Other : benzocaine, 20% Total Area Debrided (L x W): 0.5 (cm) x 0.5 (cm) = 0.25 (cm) Tissue and other material Viable, Non-Viable, Callus, Slough, Subcutaneous, Slough debrided: Level: Skin/Subcutaneous Tissue Debridement Description: Excisional Instrument: Curette Bleeding: Minimum Hemostasis Achieved: Pressure End Time: 10:58 Procedural Pain: 0 Post Procedural Pain: 0 Response to Treatment: Procedure was tolerated well Level of Consciousness Awake and Alert (Post-procedure): Post Debridement Measurements of Total Wound Length: (cm) 0.5 Width: (cm) 0.8 Depth: (cm) 0.1 Volume: (cm) 0.031 Character of Wound/Ulcer Post Improved Debridement: Severity of Tissue Post Debridement: Fat layer exposed Post Procedure Diagnosis Same as Pre-procedure Electronic Signature(s) Signed: 09/04/2019 5:30:28 PM By: Deon Pilling Signed: 09/04/2019 5:45:12 PM By: [name] MD Entered By: [name] on 09/04/2019 11:23:22 -------------------------------------------------------------------------------- HPI Details Patient Name: Date of Service: Todd, Guzman 09/04/2019 9:45 AM Medical Record [id number]  Patient Account Number: [id number] Date of Birth/Sex: Treating RN: 10-May-1946 (73 y.o. [name] Primary Care Provider: [name] Other Clinician: Referring Provider: Treating Provider/Extender:Annella Prowell, [name], [name] in Treatment: 5 History of Present Illness HPI Description: 05/18/16; this is a 73year-old diabetic who is a type II diabetic on insulin. The history is that he traumatized his right foot developed a sore sometime in late March. 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 According to the patient and his wife this wound never really healed. He was seen on 2 occasions in the wound care center in [place] and had vascular studies and then was referred urgently to Dr. Bridgett Larsson of vascular surgery. He underwent an angiogram on 05/10/16. Unfortunately nothing really could be done to improve his vascular status. He had a 75-90% stenosis in the midsegment of 1 segment of the posterior femoral artery. He had a patent popliteal, his anterior tibial occluded shortly after takeoff. Perineal had a greater than 90% stenosis posterior tibial is occluded feet had no distal collaterals feed distal aspect of the transmetatarsal amputation site. The patient tells me that he had a prolonged period of Santyl by Dr. Doran Durand was some initial improvement but then this was stopped. I think they're only applying daily dressings/dry dressings. He has not had a recent x-ray of the right foot he did have one before his surgery in April. His wife by the dimensions of the wound/surgical site being followed at home since 4/20. At that point the dimensions were 0.5 x 12 x 0.2 on 7/19 this was 1.8 x 6 x 0.4. He is not currently on any antibiotics. His hemoglobin A1c in early  April was 12.9 at that point he was started on insulin. Apparently his blood sugars are much lower he has an appointment with Dr. Legrand Como Alteimer of  endocrine next week. 05/29/16 x-ray of the area did not show osteomyelitis. I think he probably needs an MRI at this point. His wife is asking about something called"Yireh" cream which is not FDA approved. I have not heard of this. 06/22/16; MRI did not really suggest osteomyelitis. There was minimal marrow edema and enhancement in the stump of the second metatarsal felt to be secondary likely to postoperative change rather than osteomyelitis. The patient has arranged his own consultation with Dr. Andree Elk at [facility], apparently their daughter lives in [place] and has some connection here. Any improvement in vascular supply by Dr. Andree Elk would of course be helpful. Dr. Bridgett Larsson did not feel that anything further could be done other than amputation if wound care did not result in healing or if the area deteriorates. 06/26/16; the patient has been to see Dr. Andree Elk at [place] and had an angiogram. He is going for a procedure on Thursday which will involve catheterization. I'm not sure if this is an anterograde or retrograde approach. He has been using Santyl to the wound 07/10/16; the patient had a repeat angiogram and angioplasty at [place] by Dr. Brunetta Jeans. His angiogram showed right CFA and profundal widely patent. The right as of a.m. popliteal artery were widely patent the right anterior tibial was occluded proximally and reconstitutes at the ankle. Peroneal artery was patent to the foot. Posterior tibial artery was occluded. The patient had angioplasty of the anterior tibial artery.. This was quite successful. He was recommended for Plavix as well as aspirin. 07/17/16; the patient was close to be a nurse visit today however the outer dressing of the Apligraf fell off. Noted drainage. I was asked to see the wound. The patient is noted an odor however his wife had noted  that. Drainage with Apligraf not necessarily a bad thing. He has not been systemically unwell 07/24/16; we are still have an issue with drainage of this wound. In spite of this I applied his second Apligraf. Medially the area still is probing to bone. 08/07/16; Apligraf reapplied in general wound looks improved. 08/21/16 Apligraf #4. Wound looks much better 09/04/16 patientt's wound again today continues to appear to improve with the application of the Apligraf's. He notes no increased discomfort or concerns at this point in time. 09/18/16; the patient returns today 2 weeks after his fifth application of Apligraf. Predictably three quarters of the width of this wound has healed. The deep area that probe to bone medially is still open. The patient asked how much out-of-pocket dollars would be for additional Apligraf's. 09/25/16; now using Hydrofera Blue. He has completed 5 Apligraf applications with considerable improvement in this deep open transmetatarsal amputation site. His wound is now a triangular-shaped wound on the medial aspect. At roughly 12 to 2:00 this probes another centimeter but as opposed to in the past this does not probe to bone. The patient has been seen at [facility] by Dr. Zenia Resides. He is not planning to do any more revascularization unless the wound stalls or worsens per the patient 10/02/16; 0.7 x 0.8 x 0.8. Unfortunately although the wound looks stable to improved. There is now easily probable bone. This hasn't been present for several weeks. Patient is not otherwise symptomatic he is not experiencing any pain. I did a culture of the wound bed 10/09/16. Deterioration last week. Culture grew MRSA and although there is improvement here with doxycycline prescribed over the phone I'm going to try to get him linezolid 600 twice a day for 10 days today.  10/16/16; he is completing a weeks worth of linezolid and still has 3 more days to go. Small triangular-shaped open area with some degree of  undermining. There is still palpable bone with a curet. Overall the area appears better than last week 10/20/16 he has completed the linezolid still having some nausea and vomiting but no diarrhea. He has exposed bone this week which is a deterioration. 10/27/16 patient now has a small but probing wound down to bone. Culture of this bone that I did last week showed a few methicillin-resistant staph aureus. I have little doubt that this represents acute/subacute osteomyelitis. The patient is currently on Doxy which I will continue he also completed 10 days of linezolid. We are now in a difficult situation with this patient's foot after considerable discussion we will send him back to see Dr. Doran Durand for a surgical opinion of this I'm also going to try to arrange a infectious disease consult at [facility] hopefully week and get this prior to her usual 4-6 weeks we having [place]. The patient clearly is going to need 6 weeks of IV vancomycin. If we cannot arrange this expediently I'll have to consider ordering this myself through a home infusion company 11/03/16; the patient now has a small in terms of circumference but probing wound. No bone palpable today. The patient remains on doxycycline 100 twice a day which should support him until he sees infectious disease at [facility] next week the following week on Wednesday I believe he has an appointment with Dr. Andree Elk at [facility] who is his vascular cardiologist. Finally he has an appointment with Dr. Doran Durand on 11/22/16 we have been using silver alginate. The patient's wife states they are having trouble getting this through [facility] 11/13/16; the patient was seen by infectious disease at [facility] in the 11th PICC line placed in preparation for IV antibiotics. A tummy he has not going to get IV vancomycin o Ceftaroline. They've also ordered an MRI. Patient has a follow-up with Dr. Doran Durand on 11/22/16 and Dr. Andree Elk at [facility] tomorrow 11/23/16 the patient is on  daptomycin as directed by infectious disease at [facility]. He is also been back to see Dr. Andree Elk at [facility]. He underwent a repeat arteriogram. He had a successful PTA of the right anterior tibial artery. He is on dual antiplatelete treatment with Plavix and aspirin. Finally he had the MRI of his foot in [place]. This showed cellulitis about the foot worse distally edema and enhancement in the reminiscent of the second metatarsal was consistent with osteomyelitis therefore what I was assuming to be the first metatarsal may be actually the second. He also has a fluid collection deep to the calcaneus at the level of the calcaneal spur which could be an abscess or due to adventitial bursitis. He had a small tear in his Achilles 11/30/16; the patient continues on daptomycin as directed by infectious disease at [facility]. He is been revascularized by Dr. Andree Elk at [place] in [place]. He has been to see Gretta Arab who was the orthopedic surgeon who did his original amputation. I have not seen his not however per the patient's wife he did not offer another surgical local surgical prodecure to remove involved bone. He verbalized his usual disbelief in not just hyperbarics but any medical therapy for this condition(osteomyelitis). I discussed this in detail with the patient today including answering the question about a BKA definitively "curing" the current condition. 12/07/16; the patient continues on daptomycin as directed by infectious disease at  [facility]. This is directed at the MRSA that we cultured from his bone debridement from 12/22. Lab work today shows a white count of 8.7 hemoglobin of 10.5 which is microcytic and hypochromic differential count shows a slightly elevated monocyte count at 1.2 eosinophilic count of 0.6. His creatinine is 1.11 sedimentation rate apparently is gone from 35-34 now 40. I explained was wife I don't think this represents a trend. His total CK is 48 12/14/16- patient is  here for follow-up evaluation of his right TMA site. He continues to receive IV daptomycin per infectious disease. His serum inflammatory markers remain elevated. He complains of intermittent pain to the medial aspect of the TMA site with intermittent erythema. He voices no complaints or concerns regarding hyperbaric therapy. Overall he and his wife are expressing a frustration and discouragement regarrding the length of time of treatment. 12/21/16; small open wound at roughly the first or second metatarsal metatarsalphalyngeal joint reminiscence of his transmetatarsal amputation site he continues to receive IV daptomycin per infectious disease at [facility]. He will finish these a week tomorrow. He has lab work which I been copied on. His white count is 10.8 hemoglobin 8.9 MCV is low at 75., MCH low at 24.5 platelet count slightly elevated at 626. Differential count shows 70% neutrophils 10% monocytes and 10% eosinophils. His comprehensive metabolic panel shows a slightly low sodium at 133 albumin low at 3.1 total CK is normal at 42 sedimentation rate is much higher at 82. He has had iron studies that show a serum iron of 15 and iron binding capacity of 238 and iron saturation of 6. This is suggestive of iron deficiency. B12 and folate were normal ferritin at 113 The patient tells me that he is not eating well and he has lost weight. He feels episodically nauseated. He is coughing and gagging on mucus which she thinks is sinusitis. He has an appointment with his primary doctor at 5:00 this afternoon in [facility] 01/02/17; the patient developed a subacute pneumonitis. He was admitted to [facility] after a CT scan showed an extensive interstitial pneumonitis [I have not yet seen this]. 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 He has since been discharged. He is not on oxygen. He feels fatigued  and very short of breath with exertion. At [facility] the wound care nurse there felt that his wound was healed. He did complete his daptomycin and has follow-up with infectious disease on Friday. X-rays I did before he went to [facility] still suggested residual osteomyelitis in the anterior aspect of the second must metatarsal head. I'm not sure I would've expected any different. There was no other findings. I actually think I did this because of erythema over the first metatarsal head reminiscent 01/11/17; the patient has eosinophilic pneumonitis. He has been reviewed by pulmonology and given clearance for hyperbarics at least that's what his wife says. I'll need to see if there is note in care everywhere. Apparently the prognosis for improvement of daptomycin induced eosinophilic granulocyte is is 3 months without steroids. In the meantime infectious disease has placed him on doxycycline until the wound is closed. He is still is lost a lot of weight and his blood sugars are running in the mid 60s to low 80s fasting and at all times during the day 01/18/17; he has had adjustments in his insulin apparently his blood sugars in the morning or over 100. He wants to restart  his hyperbaric treatment we'll do this at 1:00. 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 I think there is good reason to complete his treatments in order to give him the best chance of maintaining a healed status and these DFU 3 wounds with MRSA infection in the bone 02/15/17; the patient was seen today in conjunction with HBO. He completed hyperbaric oxygen today. The open area on his transmetatarsal site has remained closed. There was an area of erythema when I saw him earlier in the week on the posterior heel although that is resolved as of today as well. He has been using a cam walker. This is a patient who came to [place] after a transmetatarsal  amputation that was necrotic and dehisced. He required revascularization percutaneously on 2 different occasions by Dr. Andree Elk of invasive cardiology at [facility]. 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 He went to [facility] infectious disease and had IV daptomycin for 5 weeks before developing eosinophilic pneumonitis and requiring an admission to hospital/ICU. He made a good recovery and is continued on doxycycline since. As mentioned his foot is closed now. He has a follow-up with Dr. Andree Elk tomorrow. He is going to [employer] on Monday for a custom-made shoe READMISSION Last visit Dr. Andree Elk [facility] 02/16/17 1. Critical limb ischemia of the RLE: s/p transmetatarsal amputation of the RLE, now completely healed. s/p right ATA percutaneous revascularization procedure x2, most recently 11/20/2016 s/p PTA of right AT 100% to less than 20% with a 2.5 x 200 balloon. He does have some residual osteomyelitis, but given the wound is closed, the orthopedist has recommended to follow. He has a fitting for a special shoe coming up next week. He has completed a course of daptomycin and doxycycline and he has been discharged by ID. He has completed a course of hyperbaric therapy. Continue Continue medical management with aspirin, Plavix and statin therapy. We will discuss ongoing Plavix therapy at follow-up in 6 months. 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 Given no symptoms, we will conservatively manage. He doesn't want to do any more invasive studies at this time, which is reasonable. The patient arrives today out of 2 concerns both on the right transmetatarsal site. 1 at the level of the reminiscent fifth metatarsal head and the other at roughly the first or second. Both of  these look like dark subcutaneous discoloration probably subdermal bleeding. He is recently obtained new adaptive footwear for the right foot. He also has a callus on the left fifth dorsal toe however he follows with podiatry for this and I don't think this is any issue. ABIs in this clinic today were 0.66 on the right and 1.06 on the left. On entrance into our clinic initially this was 0.95 and 0.92. He does not describe current claudication. They're going away on a cruise in 8 weeks and I think are trying to do the month is much as they can proactively. They follow with podiatry and have an appointment with Dr. Andree Elk in October READMISSION 06/25/18 This is a patient that we have not seen in almost a year. He is a type II diabetic with known PAD. He is followed by Dr. Andree Elk of interventional cardiology at [facility] in [place]. He is  required revascularization for significant PAD. When we first saw him he required a transmetatarsal amputation. He had underlying osteomyelitis with a nonhealing surgical wound. This eventually closed with wound care, IV antibiotics and hyperbaric oxygen. They tell me that he has a modified shoe and he is very active walking up to 4 miles a day. He is followed by Dr. Geroge Baseman of podiatry. His wife states that he underwent a removal of callus over this site on July 9. She felt there may be drainage from this site after that although she could never really determined and there was callus buildup again. On 06/01/18 there was pressure bleeding through the overlying callus. he was given a prescription for 7 days of Bactrim. Fortuitously he has an appointment with Dr. Andree Elk on 06/04/18 and he immediately underwent revascularization of the right leg although I have not had a chance to review these records in care everywhere. This was apparently done through anterior and retrograde access. On 06/06/18 he had another debridement by Dr. Bernette Mayers. Vitamin soaking with Epsom salts for  20 minutes and applying calcium alginate. The original trans-met was on April 2017 I believe by Dr. Doran Durand. His ABI in our clinic was noncompressible today. 07/02/18; x-ray I ordered last week was negative for osteomyelitis. Swab culture was also negative. He is going to require an MRI which I have ordered today. 07/09/18; surprisingly the MRI of the foot that I ordered did not show osteomyelitis. He did suggest the possibility of cellulitis. For this reason I'll go ahead and give him a 10 day course of doxycycline. Although the previous culture of this area was negative 07/16/18; it arrives with the wound looking much the same. Roughly the same depth. He has thick subcutaneous tissue around the wound orifice but this still has roughly the same depth. Been using silver alginate. We applied Oasis #1 today 07/23/2018; still having to remove a lot of callus and thick subcutaneous tissue to actually define the wound here. Most of this seems to have closed down yet he has a comma shaped divot over the top of the area that still I think is open. We applied Oasis #2 His wife expressed concern about the tip of his left great toe. This almost looks like a small blister. She also showed it today to her podiatrist Dr. Geroge Baseman who did not think this was anything serious. I am not sure is anything serious either however I think it bears some watching. He is not in any pain however he is insensate 07/30/2018;; still on a lot of nonviable tissue over the surface of the wound however cleaning this up reveals a more substantial wound orifice but less of a probing wound depth. This is not probed to bone. There is no evidence of infection The wife is still concerned about a non-open area on the tip of his left great toe. Almost feels like a bony outgrowth. She had previously showed this to podiatry. I do not think this is a blister. A friction area would be possible although he is really not walking according to his  wife. He had a small skin tag on his right buttock but no open wound here either 08/06/2018; we applied a third Oasis last week. Unfortunately there is really no improvement. Still requiring extensive debridement to expose the wound bed from a horizontal slitlike depression. I still have not been able to get this to fill in properly. On the positive side there is now no probable bone from when he first  came into the facility. I changed him to silver alginate today after a reasonably aggressive debridement 08/13/2018; once again the patient comes in with skin and subcutaneous tissue closing over the small probing area with the underlying cavity of the wound on the right TMA site. I applied silver alginate to this last week. Prior to that we used Oasis x3 still not able to get this area granulating. He does not have a probing area of the bone which is an improvement from when I spur started working on this and an MRI did not suggest osteomyelitis. I do not see evidence of infection here but I am increasingly concerned about why I cannot get this area to granulate. Each time I debrided this is looks like this is simply a matter of getting granulation to fill in the hole and then getting epithelialization. This does not seem to happen Also I sent him back to see podiatry Dr. Earleen Newport about the what felt to be bony outgrowth on the tip of his left great toe. Apparently after heel he left the clinic last week or the next day he developed a blood blister. He did see Dr. Earleen Newport. He went on to have a debridement of the medial nail cuticle he now has an open area here as well as some denuded skin. They did an x-ray apparently does have a bony outgrowth or spur but I am not able to look at this. They are using topical antibiotics apparently there was some suggested he use Santyl. Patient's wife was anxious for my opinion of this 08/20/2018; we are able to keep the wound open this time instead of the thick  subcutaneous tissue closing over the top of it however unfortunately once again this probes to bone. I do not see any evidence of infection and previous MRI did not show osteomyelitis. I elected to go back to the Oasis to see if we can stimulate some granulation. Last saw his vascular interventional cardiologist Dr. Andree Elk at the beginning of August and he had a repeat procedure. Nevertheless I wonder how much blood flow he has down to this area With regards to the left first toe he is seeing Dr. Earleen Newport next week. They are applying Bactroban to this area. 08/27/2018 Once again he comes in with thick eschar and subcutaneous tissue over the top of the small probing hole. This does not appear to go down to bone but it still has roughly the same depth. I reapplied Oasis today Over the left great toe there appears to be more of the wound at the tip of his toe than there was last week. This has an eschar on the surface of it. Will change to Santyl. There is seeing podiatry this afternoon 09/03/2018 He comes in today with the area on the transmetatarsal's site with a fair amount of callus, nonviable tissue over the circumference but it was not closed. I removed all of this as well as some subcutaneous debris and reapplied Oasis. There is no exposed bone The area over the tip of the left great toe started off as a nodule of uncertain etiology. He has been followed with podiatry. They have been removing part of the medial nail bed. He has nonviable tissue over the wound he been using Santyl in this area 09/10/18 Unfortunately comes in with neither wound area looking improved. The transmetatarsal amputation site once Again has nonviable debris over the surface requiring debridement. Unfortunately underneath this there is nothing that looks viable and this once again  goes right down to bone. There is no purulent drainage and no erythema. Also the surgical wound from podiatry on the left first toe has an  ischemic-looking eschar over the surface of the tip of the toe I have elected not to attempt candidly debride this His wife as arranged for him to have follow-up noninvasive studies in [place] under the care of Dr. Andree Elk clinic. The question is he has known severe PAD. They had recently seen him in August. He has had revascularizations in both legs within the last 4 or 5 months. He is not complaining of pain and I cannot really get a history of claudication. He has not systemically unwell 09/20/2018 the patient has been to [facility] and been revascularized by Dr. Andree Elk earlier this week. Apparently he was able to open up the anterior tibial artery although I have not actually seen his formal report. He is going for an attempt to revascularize on the left on Monday. He is apparently working with an investigational stent for lower extremity arteries below the knee and he has talked to the patient about placing that on Monday if possible. The patient has been using silver alginate on the transmetatarsal amputation site and Santyl on the left 09/30/2018; patient had his revascularization on the left this apparently included a standard approach as well as a more distal arterial catheterization although I do not have any information on this from Dr. Andree Elk. In fact I do not even see the initial revascularization that he had on the right. I have included the arterial history from Dr. Andree Elk last note however below; ASSESSMENT/PLAN: 1. Hx of Critical limb ischemia bilateral lower extremities, PAD: -s/p transmetatarsal amputation of the RLE. -s/p right ATA percutaneous revascularization procedure x2, most recently 11/20/2016 s/p PTA of right AT 100% to less than 20% with a 2.5 x 200 balloon. 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 -03/21/2018 s/p PTA of 90% left popliteal  artery to <10% with a 5x20 cutting balloon, PTA of 90% left peroneal to <20% with a 3x20 balloon, PTA of 100% left AT to <20% with a 2.5x220 balloon. 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 Will plan on the LLE soon after. Risks/benefits of procedure have been discussed and patient has elected to proceed. We have been using endoform to the right TMA amputation site wound and Santyl to the left great toe 10/07/2018; the area on the tip of his left great toe looked better we have been using Santyl here. We continue to have a very difficult probing hole on the right TMA amputation site we have been using endoform. I went on to use his fifth Oasis today 10/14/2018; the tip of the left great toe continues to look better. We have been using Santyl here the surface however is healthy and I think we can change to an alginate. The right TMA has not changed. Once again he has no superficial opening there is callus and thick subcutaneous tissue over the orifice once you remove this there is the probing area that we have been dealing with without too much change. There is no palpable bone I have been placing Oasis here and put Oasis #6 in this today after a more vigorous debridement 10/21/18; the  left great toe still has necrotic surface requiring debridement. The right TMA site hasn't changed in view of the thick callus over the wound bed. With removal of this there is still the opening however this does not appear to have the same depth. Again there is no palpable bone. Oasis was replaced 10/28/2018; patient comes in with both wounds looking worse. The area over the first toe tip is now down to bone. The area over the TMA site is deeper and down to bone clearly with a increase in overall wound area. Equally concerning on the right TMA is the complete absence  of a pulse this week which is a change. We are not even able to Doppler this. On the right he has a noncompressible ABI greater than 1.4 11/04/2018. Both wounds look somewhat worse. X-rays showed no osteomyelitis of the right foot but on the left there was underlying osteomyelitis in the left great toe distal phalanx. I been on the phone to Dr. Andree Elk surface at [facility] in [place] and they are arranging for another angiogram on the right on Monday. I have him on doxycycline for the osteomyelitis in the left great toe for now. Infectious disease may be necessary 1/17; 2-week hiatus. Patient was admitted to hospital at [place]. My understanding is he underwent an angioplasty of the right anterior tibial artery and had stents placed in the left anterior artery and the left tibial peroneal trunk. This was done by Dr. Andree Elk of interventional radiology. There is no major change in either 1 of the wounds. They have been using Aquacel Ag. As far as they are aware no imaging studies were done of the foot which is indeed unfortunate. I had him on doxycycline for 2 weeks since we identified the osteomyelitis in the left great toe by plain x-ray. I have renewed that again today. I am still suspicious about osteomyelitis in the amputation site and would consider doing another MRI to compare with the one done in September. The idea of hyperbaric oxygen certainly comes up for discussion 1/24; no major change in either wound area. I have him on doxycycline for osteomyelitis at the tip of the left great toe. As noted he has been previously and recently revascularized by Dr. Andree Elk at [place]. He is tolerating the doxycycline well. For some reason we do not have an infectious disease consult yet. Culture of drainage from the right foot site last week was negative 1/31; MRI of the right foot did not show osteomyelitis of the right ankle and foot. Notable for a skin ulceration overlying the second metatarsal stump with  generalizing soft tissue edema of the ankle and foot consistent with cellulitis. Noted to have a partial-thickness tear of the Achilles tendon 7.5 cm proximal to the insertion Nothing really new in terms of symptoms. Patient's area on the tip of the left great toe is just about closed although he still has the probing area on the metatarsal amputation site. Appointment with Dr. Linus Salmons of infectious disease next week 2/7; Dr. Novella Olive did not feel that any further antibiotics were necessary he would follow-up in 2 months. The left great toe appears to be closed still some surface callus that I gently looked under high did not see anything open or anything that was threatening to be open. He still has the open area on the mid part of his TMA site using endoform 2/14; left great toe is closed and he is completing his doxycycline as of last Sunday. He is not  on any antibiotics. Unfortunately out of the right foot his wife noticed some subdermal hemorrhage this week. He had been walking 2 miles I had given him permission to do so this is not really a plantar wound. Using endoform to this wound but I changed to silver alginate this week 2/21; left great toe remains closed. Culture last week grew Streptococcus angiosis which I am not really familiar with however it is penicillin sensitive and I am going to put him on Augmentin. Not much change in the wound on the right foot the deep area is still probing precariously close to bone and the wound on the margin of the TMA is larger. 2/28; left great toe remains closed. He is completing the Augmentin I gave him last week. 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 This is being shown to Dr. Andree Elk at [place] to see if there is anything else that can be done here. He has been using silver alginate strips on the right 3/6; left great toe remains closed. He sees Dr. Andree Elk on Monday. The area on the plantar aspect of the right  foot has the same small orifice with thick callused tissue around this. However this time with removal of the callus tissue the wound is open all the way along the incision line to the end medially. We have been using silver alginate. 3/13; left toe remains closed although the area is callused. He sees Dr. Jacqualyn Posey of podiatry next week. The area on the plantar right foot looked a lot better this week. Culture I did of this was negative we use silver alginate. He is going next week for an attempt at revascularization by Dr. Andree Elk 3/23; left toe remains closed although the area is callused. He will see Dr. Jacqualyn Posey in follow-up. The area on the right plantar foot continues to look surprisingly better over the last 3 visits. We have been using silver alginate. The revascularization he was supposed to have by Dr. Andree Elk at [facility] in [place] has been canceled [facility] procedure] 4/6; the right foot remains closed albeit callused. Podiatry canceled the appointment with regards to the left great toe. He has not seen Dr. Andree Elk at [facility] but thinks that Dr. Andree Elk has "done all he can do". He would be a candidate for the hemostaemix trial Readmission 07/29/2019 Mr. [name] is a man we know well from at least 3 previous stays in this clinic. He is a type II diabetic with severe PAD followed by Dr. Andree Elk at [facility] in [place]. 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 So far everything in both of these areas has remained closed. About 2 weeks ago he went to see his podiatrist [facility]. Babs Bertin. He had a thick callus on the left fifth metatarsal head that was shaved. He has developed an open wound in this area. They have been offloading this in his diabetic shoes. The patient has not had any more revascularizations by Dr. Andree Elk since the last time he was here.  ABI in our clinic at the posterior tibial on the left was 1.06 10/6; no real change in the area on the plantar met head. Small wound with 2 mm of depth. He has thick skin probably from pressure around the wound. We have been using silver alginate 10/13; small wound  in the left fifth plantar met head. Arrives today with undermining laterally and purulent drainage. Our intake nurse cultured this. His wife stated they noticed a change in color over the last day or 2. He is not systemically unwell 10/19; small wound on the fifth plantar metatarsal head. Culture I did last week showed Staphylococcus lugdunensis. Although this could be a skin contaminant the possibility of a skin and soft tissue infection was there. I did give him empiric doxycycline which should have covered this. We are using silver alginate to the wound. We put him in a total contact cast today. 10/22; small wound on the fifth plantar metatarsal head. He has completed antibiotics. He also has severe PAD which worries me about just about any wound on this man's foot 10/29; small superficial area on the fifth plantar metatarsal head. Measuring slightly smaller. He is not currently on any antibiotics. I been using a total contact cast in this man with very severe PAD but he seems to be tolerating this well 11/5; left plantar fifth metatarsal head. Silver alginate being used under a total contact cast Electronic Signature(s) Signed: 09/04/2019 5:45:12 PM By: [name] MD Entered By: [name] on 09/04/2019 11:24:24 -------------------------------------------------------------------------------- Physical Exam Details Patient Name: Date of Service: Todd, Guzman 09/04/2019 9:45 AM Medical Record [id number] Patient Account Number: [id number] Date of Birth/Sex: Treating RN: 08-11-1946 (73 y.o. [name] Primary Care Provider: [name] Other Clinician: Referring Provider: Treating  Provider/Extender:Markeith Jue, [name], [name] in Treatment: 5 Constitutional Sitting or standing Blood Pressure is within target range for patient.. Pulse regular and within target range for patient.Marland Kitchen Respirations regular, non-labored and within target range.. Temperature is normal and within the target range for the patient.Marland Kitchen Appears in no distress. Notes Wound exam; slightly smaller however under illumination that tightly adherent yellow fibrinous debris. Gentle debridement with a #3 curette there is bleeding. Hemostasis with direct pressure. No evidence of infection around the wound Electronic Signature(s) Signed: 09/04/2019 5:45:12 PM By: [name] MD Entered By: [name] on 09/04/2019 11:25:04 -------------------------------------------------------------------------------- Physician Orders Details Patient Name: Date of Service: DEMON, VOLANTE 09/04/2019 9:45 AM Medical Record [id number] Patient Account Number: [id number] Date of Birth/Sex: Treating RN: Mar 11, 1946 (73 y.o. [name] Primary Care Provider: [name] Other Clinician: Referring Provider: Treating Provider/Extender:Atina Feeley, [name], [name] in Treatment: 5 Verbal / Phone Orders: No Diagnosis Coding ICD-10 Coding Code Description E11.621 Type 2 diabetes mellitus with foot ulcer E11.51 Type 2 diabetes mellitus with diabetic peripheral angiopathy without gangrene L97.521 Non-pressure chronic ulcer of other part of left foot limited to breakdown of skin L03.116 Cellulitis of left lower limb Follow-up Appointments Return Appointment in 1 week. Dressing Change Frequency Wound #5 Left Metatarsal head fifth Do not change entire dressing for one week. Wound Cleansing Wound #5 Left Metatarsal head fifth May shower with protection. Primary Wound Dressing Wound #5 Left Metatarsal head fifth Silver Collagen - moisten with hydrogel Secondary Dressing Wound #5 Left  Metatarsal head fifth Foam - pad the great toe. Dry Gauze Drawtex Off-Loading Total Contact Cast to Left Lower Extremity Electronic Signature(s) Signed: 09/04/2019 5:16:49 PM By: Kela Millin Signed: 09/04/2019 5:45:12 PM By: [name] MD Entered By: Kela Millin on 09/04/2019 11:02:25 -------------------------------------------------------------------------------- Problem List Details Patient Name: Date of Service: EVIAN, SALGUERO 09/04/2019 9:45 AM Medical Record [id number] Patient Account Number: [id number] Date of Birth/Sex: Treating RN: [birth year] (73 y.o. [name] Primary Care Provider: [name] Other Clinician: Referring Provider: Treating Provider/Extender:Geralda Baumgardner, [name], [name]  in Treatment: 5 Active Problems ICD-10 Evaluated Encounter Code Description Active Date Today Diagnosis E11.621 Type 2 diabetes mellitus with foot ulcer 07/29/2019 No Yes E11.51 Type 2 diabetes mellitus with diabetic peripheral 07/29/2019 No Yes angiopathy without gangrene L97.521 Non-pressure chronic ulcer of other part of left foot 07/29/2019 No Yes limited to breakdown of skin L03.116 Cellulitis of left lower limb 08/12/2019 No Yes Inactive Problems Resolved Problems Electronic Signature(s) Signed: 09/04/2019 5:45:12 PM By: [name] MD Entered By: [name] on 09/04/2019 11:22:50 -------------------------------------------------------------------------------- Progress Note Details Patient Name: Date of Service: Todd, Guzman 09/04/2019 9:45 AM Medical Record [id number] Patient Account Number: [id number] Date of Birth/Sex: Treating RN: September 24, 1946 (73 y.o. [name] Primary Care Provider: [name] Other Clinician: Referring Provider: Treating Provider/Extender:Travor Royce, [name], [name] in Treatment: 5 Subjective History of Present Illness (HPI) 05/18/16; this is a 73year-old diabetic who is a type II  diabetic on insulin. The history is that he traumatized his right foot developed a sore sometime in late March. Shortly thereafter he went on a cruise but he had to get off the cruise ship in [place] and fly urgently back to [place] where he was admitted to [facility] and ultimately underwent a transmetatarsal amputation by Dr. 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 According to the patient and his wife this wound never really healed. He was seen on 2 occasions in the wound care center in [place] and had vascular studies and then was referred urgently to Dr. Bridgett Larsson of vascular surgery. He underwent an angiogram on 05/10/16. Unfortunately nothing really could be done to improve his vascular status. He had a 75-90% stenosis in the midsegment of 1 segment of the posterior femoral artery. He had a patent popliteal, his anterior tibial occluded shortly after takeoff. Perineal had a greater than 90% stenosis posterior tibial is occluded feet had no distal collaterals feed distal aspect of the transmetatarsal amputation site. The patient tells me that he had a prolonged period of Santyl by Dr. Doran Durand was some initial improvement but then this was stopped. I think they're only applying daily dressings/dry dressings. He has not had a recent x-ray of the right foot he did have one before his surgery in April. His wife by the dimensions of the wound/surgical site being followed at home since 4/20. At that point the dimensions were 0.5 x 12 x 0.2 on 7/19 this was 1.8 x 6 x 0.4. He is not currently on any antibiotics. His hemoglobin A1c in early April was 12.9 at that point he was started on insulin. Apparently his blood sugars are much lower he has an appointment with Dr. Legrand Como Alteimer of endocrine next week. 05/29/16 x-ray of the area did not show osteomyelitis. I think he probably needs an MRI at this point. His wife is asking about something called"Yireh" cream which is not FDA approved. I have  not heard of this. 06/22/16; MRI did not really suggest osteomyelitis. There was minimal marrow edema and enhancement in the stump of the second metatarsal felt to be secondary likely to postoperative change rather than osteomyelitis. The patient has arranged his own consultation with Dr. Andree Elk at [place], apparently their daughter lives in [place] and has some connection here. Any improvement in vascular supply by Dr. Andree Elk would of course be helpful. Dr. Bridgett Larsson did not feel that anything further could be done other than amputation if wound care did not result in healing or if the area deteriorates. 06/26/16; the patient has been to see  Dr. Andree Elk at [place] and had an angiogram. He is going for a procedure on Thursday which will involve catheterization. I'm not sure if this is an anterograde or retrograde approach. He has been using Santyl to the wound 07/10/16; the patient had a repeat angiogram and angioplasty at [place] by Dr. Brunetta Jeans. His angiogram showed right CFA and profundal widely patent. The right as of a.m. popliteal artery were widely patent the right anterior tibial was occluded proximally and reconstitutes at the ankle. Peroneal artery was patent to the foot. Posterior tibial artery was occluded. The patient had angioplasty of the anterior tibial artery.. This was quite successful. He was recommended for Plavix as well as aspirin. 07/17/16; the patient was close to be a nurse visit today however the outer dressing of the Apligraf fell off. Noted drainage. I was asked to see the wound. The patient is noted an odor however his wife had noted that. Drainage with Apligraf not necessarily a bad thing. He has not been systemically unwell 07/24/16; we are still have an issue with drainage of this wound. In spite of this I applied his second Apligraf. Medially the area still is probing to bone. 08/07/16; Apligraf reapplied in general wound looks improved. 08/21/16 Apligraf #4. Wound looks much  better 09/04/16 patientt's wound again today continues to appear to improve with the application of the Apligraf's. He notes no increased discomfort or concerns at this point in time. 09/18/16; the patient returns today 2 weeks after his fifth application of Apligraf. Predictably three quarters of the width of this wound has healed. The deep area that probe to bone medially is still open. The patient asked how much out-of-pocket dollars would be for additional Apligraf's. 09/25/16; now using Hydrofera Blue. He has completed 5 Apligraf applications with considerable improvement in this deep open transmetatarsal amputation site. His wound is now a triangular-shaped wound on the medial aspect. At roughly 12 to 2:00 this probes another centimeter but as opposed to in the past this does not probe to bone. The patient has been seen at [place] by Dr. Zenia Resides. He is not planning to do any more revascularization unless the wound stalls or worsens per the patient 10/02/16; 0.7 x 0.8 x 0.8. Unfortunately although the wound looks stable to improved. There is now easily probable bone. This hasn't been present for several weeks. Patient is not otherwise symptomatic he is not experiencing any pain. I did a culture of the wound bed 10/09/16. Deterioration last week. Culture grew MRSA and although there is improvement here with doxycycline prescribed over the phone I'm going to try to get him linezolid 600 twice a day for 10 days today. 10/16/16; he is completing a weeks worth of linezolid and still has 3 more days to go. Small triangular-shaped open area with some degree of undermining. There is still palpable bone with a curet. Overall the area appears better than last week 10/20/16 he has completed the linezolid still having some nausea and vomiting but no diarrhea. He has exposed bone this week which is a deterioration. 10/27/16 patient now has a small but probing wound down to bone. Culture of this bone that I did  last week showed a few methicillin-resistant staph aureus. I have little doubt that this represents acute/subacute osteomyelitis. The patient is currently on Doxy which I will continue he also completed 10 days of linezolid. We are now in a difficult situation with this patient's foot after considerable discussion we will send him back to see Dr.  Hewitt for a surgical opinion of this I'm also going to try to arrange a infectious disease consult at [facility] hopefully week and get this prior to her usual 4-6 weeks we having [place]. The patient clearly is going to need 6 weeks of IV vancomycin. If we cannot arrange this expediently I'll have to consider ordering this myself through a home infusion company 11/03/16; the patient now has a small in terms of circumference but probing wound. No bone palpable today. The patient remains on doxycycline 100 twice a day which should support him until he sees infectious disease at [facility] next week the following week on Wednesday I believe he has an appointment with Dr. Andree Elk at [facility] who is his vascular cardiologist. Finally he has an appointment with Dr. Doran Durand on 11/22/16 we have been using silver alginate. The patient's wife states they are having trouble getting this through [facility] 11/13/16; the patient was seen by infectious disease at [facility] in the 11th PICC line placed in preparation for IV antibiotics. A tummy he has not going to get IV vancomycin o Ceftaroline. They've also ordered an MRI. Patient has a follow-up with Dr. Doran Durand on 11/22/16 and Dr. Andree Elk at [facility] tomorrow 11/23/16 the patient is on daptomycin as directed by infectious disease at [facility]. He is also been back to see Dr. Andree Elk at [facility]. He underwent a repeat arteriogram. He had a successful PTA of the right anterior tibial artery. He is on dual antiplatelete treatment with Plavix and aspirin. Finally he had the MRI of his foot in [place]. This showed cellulitis about  the foot worse distally edema and enhancement in the reminiscent of the second metatarsal was consistent with osteomyelitis therefore what I was assuming to be the first metatarsal may be actually the second. He also has a fluid collection deep to the calcaneus at the level of the calcaneal spur which could be an abscess or due to adventitial bursitis. He had a small tear in his Achilles 11/30/16; the patient continues on daptomycin as directed by infectious disease at [facility]. He is been revascularized by Dr. Andree Elk at [place] in [place]. He has been to see Gretta Arab who was the orthopedic surgeon who did his original amputation. I have not seen his not however per the patient's wife he did not offer another surgical local surgical prodecure to remove involved bone. He verbalized his usual disbelief in not just hyperbarics but any medical therapy for this condition(osteomyelitis). I discussed this in detail with the patient today including answering the question about a BKA definitively "curing" the current condition. 12/07/16; the patient continues on daptomycin as directed by infectious disease at [facility]. This is directed at the MRSA that we cultured from his bone debridement from 12/22. Lab work today shows a white count of 8.7 hemoglobin of 10.5 which is microcytic and hypochromic differential count shows a slightly elevated monocyte count at 1.2 eosinophilic count of 0.6. His creatinine is 1.11 sedimentation rate apparently is gone from 35-34 now 40. I explained was wife I don't think this represents a trend. His total CK is 48 12/14/16- patient is here for follow-up evaluation of his right TMA site. He continues to receive IV daptomycin per infectious disease. His serum inflammatory markers remain elevated. He complains of intermittent pain to the medial aspect of the TMA site with intermittent erythema. He voices no complaints or concerns regarding hyperbaric therapy. Overall he and his  wife are expressing a frustration and discouragement regarrding the length  of time of treatment. 12/21/16; small open wound at roughly the first or second metatarsal metatarsalphalyngeal joint reminiscence of his transmetatarsal amputation site he continues to receive IV daptomycin per infectious disease at [facility]. He will finish these a week tomorrow. He has lab work which I been copied on. His white count is 10.8 hemoglobin 8.9 MCV is low at 75., MCH low at 24.5 platelet count slightly elevated at 626. Differential count shows 70% neutrophils 10% monocytes and 10% eosinophils. His comprehensive metabolic panel shows a slightly low sodium at 133 albumin low at 3.1 total CK is normal at 42 sedimentation rate is much higher at 82. He has had iron studies that show a serum iron of 15 and iron binding capacity of 238 and iron saturation of 6. This is suggestive of iron deficiency. B12 and folate were normal ferritin at 113 The patient tells me that he is not eating well and he has lost weight. He feels episodically nauseated. He is coughing and gagging on mucus which she thinks is sinusitis. He has an appointment with his primary doctor at 5:00 this afternoon in [facility] 01/02/17; the patient developed a subacute pneumonitis. He was admitted to [facility] after a CT scan showed an extensive interstitial pneumonitis [I have not yet seen this]. 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 He has since been discharged. He is not on oxygen. He feels fatigued and very short of breath with exertion. At [facility] the wound care nurse there felt that his wound was healed. He did complete his daptomycin and has follow-up with infectious disease on Friday. X-rays I did before he went to [facility] still suggested residual osteomyelitis in the anterior aspect of the second must metatarsal head. I'm not sure I  would've expected any different. There was no other findings. I actually think I did this because of erythema over the first metatarsal head reminiscent 01/11/17; the patient has eosinophilic pneumonitis. He has been reviewed by pulmonology and given clearance for hyperbarics at least that's what his wife says. I'll need to see if there is note in care everywhere. Apparently the prognosis for improvement of daptomycin induced eosinophilic granulocyte is is 3 months without steroids. In the meantime infectious disease has placed him on doxycycline until the wound is closed. He is still is lost a lot of weight and his blood sugars are running in the mid 60s to low 80s fasting and at all times during the day 01/18/17; he has had adjustments in his insulin apparently his blood sugars in the morning or over 100. He wants to restart his hyperbaric treatment we'll do this at 1:00. 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 I think there is good reason to complete his treatments in order to give him the best chance of maintaining a healed status and these DFU 3 wounds with MRSA infection in the bone 02/15/17; the patient was seen today in conjunction with HBO. He completed hyperbaric oxygen today. The open area on his transmetatarsal site has remained closed. There was an area of erythema when I saw him earlier in the week on the posterior heel although that is resolved as of today as well. He has been using a cam walker. This is a patient who came to [place] after a transmetatarsal amputation that was necrotic and dehisced. He required revascularization percutaneously on 2 different  occasions by Dr. Andree Elk of invasive cardiology at [facility]. 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 He went to [facility] infectious disease and had IV  daptomycin for 5 weeks before developing eosinophilic pneumonitis and requiring an admission to hospital/ICU. He made a good recovery and is continued on doxycycline since. As mentioned his foot is closed now. He has a follow-up with Dr. Andree Elk tomorrow. He is going to [employer] on Monday for a custom-made shoe READMISSION Last visit Dr. Andree Elk [facility] 02/16/17 1. Critical limb ischemia of the RLE:  s/p transmetatarsal amputation of the RLE, now completely healed.  s/p right ATA percutaneous revascularization procedure x2, most recently 11/20/2016 s/p PTA of right AT 100% to less than 20% with a 2.5 x 200 balloon.  He does have some residual osteomyelitis, but given the wound is closed, the orthopedist has recommended to follow. He has a fitting for a special shoe coming up next week. He has completed a course of daptomycin and doxycycline and he has been discharged by ID. He has completed a course of hyperbaric therapy.  Continue Continue medical management with aspirin, Plavix and statin therapy. We will discuss ongoing Plavix therapy at follow-up in 6 months.  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 Given no symptoms, we will conservatively manage. He doesn't want to do any more invasive studies at this time, which is reasonable. The patient arrives today out of 2 concerns both on the right transmetatarsal site. 1 at the level of the reminiscent fifth metatarsal head and the other at roughly the first or second. Both of these look like dark subcutaneous discoloration probably subdermal bleeding. He is recently obtained new adaptive footwear for the right foot. He also has a callus on the left fifth dorsal toe however he follows with podiatry for this and I don't think this is any issue. ABIs in this clinic today were 0.66 on the right and 1.06 on  the left. On entrance into our clinic initially this was 0.95 and 0.92. He does not describe current claudication. They're going away on a cruise in 8 weeks and I think are trying to do the month is much as they can proactively. They follow with podiatry and have an appointment with Dr. Andree Elk in October READMISSION 06/25/18 This is a patient that we have not seen in almost a year. He is a type II diabetic with known PAD. He is followed by Dr. Andree Elk of interventional cardiology at [facility] in [name]. He is required revascularization for significant PAD. When we first saw him he required a transmetatarsal amputation. He had underlying osteomyelitis with a nonhealing surgical wound. This eventually closed with wound care, IV antibiotics and hyperbaric oxygen. They tell me that he has a modified shoe and he is very active walking up to 4 miles a day. He is followed by Dr. Geroge Baseman of podiatry. His wife states that he underwent a removal of callus over this site on July 9. She felt there may be drainage from this site after that although she could never really determined and there was callus buildup again. On 06/01/18 there was pressure bleeding through the overlying callus. he was given a prescription for 7 days of Bactrim. Fortuitously he has an appointment with Dr. Andree Elk on 06/04/18 and he immediately underwent revascularization  of the right leg although I have not had a chance to review these records in care everywhere. This was apparently done through anterior and retrograde access. On 06/06/18 he had another debridement by Dr. Bernette Mayers. Vitamin soaking with Epsom salts for 20 minutes and applying calcium alginate. The original trans-met was on April 2017 I believe by Dr. Doran Durand. His ABI in our clinic was noncompressible today. 07/02/18; x-ray I ordered last week was negative for osteomyelitis. Swab culture was also negative. He is going to require an MRI which I have ordered today. 07/09/18;  surprisingly the MRI of the foot that I ordered did not show osteomyelitis. He did suggest the possibility of cellulitis. For this reason I'll go ahead and give him a 10 day course of doxycycline. Although the previous culture of this area was negative 07/16/18; it arrives with the wound looking much the same. Roughly the same depth. He has thick subcutaneous tissue around the wound orifice but this still has roughly the same depth. Been using silver alginate. We applied Oasis #1 today 07/23/2018; still having to remove a lot of callus and thick subcutaneous tissue to actually define the wound here. Most of this seems to have closed down yet he has a comma shaped divot over the top of the area that still I think is open. We applied Oasis #2 His wife expressed concern about the tip of his left great toe. This almost looks like a small blister. She also showed it today to her podiatrist Dr. Geroge Baseman who did not think this was anything serious. I am not sure is anything serious either however I think it bears some watching. He is not in any pain however he is insensate 07/30/2018;; still on a lot of nonviable tissue over the surface of the wound however cleaning this up reveals a more substantial wound orifice but less of a probing wound depth. This is not probed to bone. There is no evidence of infection The wife is still concerned about a non-open area on the tip of his left great toe. Almost feels like a bony outgrowth. She had previously showed this to podiatry. I do not think this is a blister. A friction area would be possible although he is really not walking according to his wife. He had a small skin tag on his right buttock but no open wound here either 08/06/2018; we applied a third Oasis last week. Unfortunately there is really no improvement. Still requiring extensive debridement to expose the wound bed from a horizontal slitlike depression. I still have not been able to get this to fill in  properly. On the positive side there is now no probable bone from when he first came into the facility. I changed him to silver alginate today after a reasonably aggressive debridement 08/13/2018; once again the patient comes in with skin and subcutaneous tissue closing over the small probing area with the underlying cavity of the wound on the right TMA site. I applied silver alginate to this last week. Prior to that we used Oasis x3 still not able to get this area granulating. He does not have a probing area of the bone which is an improvement from when I spur started working on this and an MRI did not suggest osteomyelitis. I do not see evidence of infection here but I am increasingly concerned about why I cannot get this area to granulate. Each time I debrided this is looks like this is simply a matter of getting granulation to fill  in the hole and then getting epithelialization. This does not seem to happen Also I sent him back to see podiatry Dr. Earleen Newport about the what felt to be bony outgrowth on the tip of his left great toe. Apparently after heel he left the clinic last week or the next day he developed a blood blister. He did see Dr. Earleen Newport. He went on to have a debridement of the medial nail cuticle he now has an open area here as well as some denuded skin. They did an x-ray apparently does have a bony outgrowth or spur but I am not able to look at this. They are using topical antibiotics apparently there was some suggested he use Santyl. Patient's wife was anxious for my opinion of this 08/20/2018; we are able to keep the wound open this time instead of the thick subcutaneous tissue closing over the top of it however unfortunately once again this probes to bone. I do not see any evidence of infection and previous MRI did not show osteomyelitis. I elected to go back to the Oasis to see if we can stimulate some granulation. Last saw his vascular interventional cardiologist Dr. Andree Elk at the  beginning of August and he had a repeat procedure. Nevertheless I wonder how much blood flow he has down to this area With regards to the left first toe he is seeing Dr. Earleen Newport next week. They are applying Bactroban to this area. 08/27/2018 ooOnce again he comes in with thick eschar and subcutaneous tissue over the top of the small probing hole. This does not appear to go down to bone but it still has roughly the same depth. I reapplied Oasis today ooOver the left great toe there appears to be more of the wound at the tip of his toe than there was last week. This has an eschar on the surface of it. Will change to Santyl. There is seeing podiatry this afternoon 09/03/2018 ooHe comes in today with the area on the transmetatarsal's site with a fair amount of callus, nonviable tissue over the circumference but it was not closed. I removed all of this as well as some subcutaneous debris and reapplied Oasis. There is no exposed bone ooThe area over the tip of the left great toe started off as a nodule of uncertain etiology. He has been followed with podiatry. They have been removing part of the medial nail bed. He has nonviable tissue over the wound he been using Santyl in this area 09/10/18 ooUnfortunately comes in with neither wound area looking improved. The transmetatarsal amputation site once Again has nonviable debris over the surface requiring debridement. Unfortunately underneath this there is nothing that looks viable and this once again goes right down to bone. There is no purulent drainage and no erythema. ooAlso the surgical wound from podiatry on the left first toe has an ischemic-looking eschar over the surface of the tip of the toe I have elected not to attempt candidly debride this ooHis wife as arranged for him to have follow-up noninvasive studies in [place] under the care of Dr. Andree Elk clinic. The question is he has known severe PAD. They had recently seen him in August. He  has had revascularizations in both legs within the last 4 or 5 months. He is not complaining of pain and I cannot really get a history of claudication. He has not systemically unwell 09/20/2018 the patient has been to [facility] and been revascularized by Dr. Andree Elk earlier this week. Apparently he was able to  open up the anterior tibial artery although I have not actually seen his formal report. He is going for an attempt to revascularize on the left on Monday. He is apparently working with an investigational stent for lower extremity arteries below the knee and he has talked to the patient about placing that on Monday if possible. The patient has been using silver alginate on the transmetatarsal amputation site and Santyl on the left 09/30/2018; patient had his revascularization on the left this apparently included a standard approach as well as a more distal arterial catheterization although I do not have any information on this from Dr. Andree Elk. In fact I do not even see the initial revascularization that he had on the right. I have included the arterial history from Dr. Andree Elk last note however below; ASSESSMENT/PLAN: 1. Hx of Critical limb ischemia bilateral lower extremities, PAD: -s/p transmetatarsal amputation of the RLE. -s/p right ATA percutaneous revascularization procedure x2, most recently 11/20/2016 s/p PTA of right AT 100% to less than 20% with a 2.5 x 200 balloon. 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 -03/21/2018 s/p PTA of 90% left popliteal artery to <10% with a 5x20 cutting balloon, PTA of 90% left peroneal to <20% with a 3x20 balloon, PTA of 100% left AT to <20% with a 2.5x220 balloon. -Considering he has bilateral lower extremity CLI on the right foot and L great toe, we will plan on abdominal aortogram focusing on the right lower extremity  via left common femoral access. Will plan on the LLE soon after. Risks/benefits of procedure have been discussed and patient has elected to proceed. We have been using endoform to the right TMA amputation site wound and Santyl to the left great toe 10/07/2018; the area on the tip of his left great toe looked better we have been using Santyl here. We continue to have a very difficult probing hole on the right TMA amputation site we have been using endoform. I went on to use his fifth Oasis today 10/14/2018; the tip of the left great toe continues to look better. We have been using Santyl here the surface however is healthy and I think we can change to an alginate. The right TMA has not changed. Once again he has no superficial opening there is callus and thick subcutaneous tissue over the orifice once you remove this there is the probing area that we have been dealing with without too much change. There is no palpable bone I have been placing Oasis here and put Oasis #6 in this today after a more vigorous debridement 10/21/18; the left great toe still has necrotic surface requiring debridement. The right TMA site hasn't changed in view of the thick callus over the wound bed. With removal of this there is still the opening however this does not appear to have the same depth. Again there is no palpable bone. Oasis was replaced 10/28/2018; patient comes in with both wounds looking worse. The area over the first toe tip is now down to bone. The area over the TMA site is deeper and down to bone clearly with a increase in overall wound area. Equally concerning on the right TMA is the complete absence of a pulse this week which is a change. We are not even able to Doppler this. On the right he has a noncompressible ABI greater than 1.4 11/04/2018. Both wounds look somewhat worse.  X-rays showed no osteomyelitis of the right foot but on the left there was underlying osteomyelitis in the left great toe distal  phalanx. I been on the phone to Dr. Andree Elk surface at [facility] in [place] and they are arranging for another angiogram on the right on Monday. I have him on doxycycline for the osteomyelitis in the left great toe for now. Infectious disease may be necessary 1/17; 2-week hiatus. Patient was admitted to hospital at [place]. My understanding is he underwent an angioplasty of the right anterior tibial artery and had stents placed in the left anterior artery and the left tibial peroneal trunk. This was done by Dr. Andree Elk of interventional radiology. There is no major change in either 1 of the wounds. They have been using Aquacel Ag. As far as they are aware no imaging studies were done of the foot which is indeed unfortunate. I had him on doxycycline for 2 weeks since we identified the osteomyelitis in the left great toe by plain x-ray. I have renewed that again today. I am still suspicious about osteomyelitis in the amputation site and would consider doing another MRI to compare with the one done in September. The idea of hyperbaric oxygen certainly comes up for discussion 1/24; no major change in either wound area. I have him on doxycycline for osteomyelitis at the tip of the left great toe. As noted he has been previously and recently revascularized by Dr. Andree Elk at [place]. He is tolerating the doxycycline well. For some reason we do not have an infectious disease consult yet. Culture of drainage from the right foot site last week was negative 1/31; MRI of the right foot did not show osteomyelitis of the right ankle and foot. Notable for a skin ulceration overlying the second metatarsal stump with generalizing soft tissue edema of the ankle and foot consistent with cellulitis. Noted to have a partial-thickness tear of the Achilles tendon 7.5 cm proximal to the insertion Nothing really new in terms of symptoms. Patient's area on the tip of the left great toe is just about closed although he still has the  probing area on the metatarsal amputation site. Appointment with Dr. Linus Salmons of infectious disease next week 2/7; Dr. Novella Olive did not feel that any further antibiotics were necessary he would follow-up in 2 months. The left great toe appears to be closed still some surface callus that I gently looked under high did not see anything open or anything that was threatening to be open. He still has the open area on the mid part of his TMA site using endoform 2/14; left great toe is closed and he is completing his doxycycline as of last Sunday. He is not on any antibiotics. Unfortunately out of the right foot his wife noticed some subdermal hemorrhage this week. He had been walking 2 miles I had given him permission to do so this is not really a plantar wound. Using endoform to this wound but I changed to silver alginate this week 2/21; left great toe remains closed. Culture last week grew Streptococcus angiosis which I am not really familiar with however it is penicillin sensitive and I am going to put him on Augmentin. Not much change in the wound on the right foot the deep area is still probing precariously close to bone and the wound on the margin of the TMA is larger. 2/28; left great toe remains closed. He is completing the Augmentin I gave him last week. Apparently the anterior tibial artery on the  right is totally reoccluded again. This is being shown to Dr. Andree Elk at [place] to see if there is anything else that can be done here. He has been using silver alginate strips on the right 3/6; left great toe remains closed. He sees Dr. Andree Elk on Monday. The area on the plantar aspect of the right foot has the same small orifice with thick callused tissue around this. However this time with removal of the callus tissue the wound is open all the way along the incision line to the end medially. We have been using silver alginate. 3/13; left toe remains closed although the area is callused. He sees Dr. Jacqualyn Posey of  podiatry next week. The area on the plantar right foot looked a lot better this week. Culture I did of this was negative we use silver alginate. He is going next week for an attempt at revascularization by Dr. Andree Elk 3/23; left toe remains closed although the area is callused. He will see Dr. Jacqualyn Posey in follow-up. The area on the right plantar foot continues to look surprisingly better over the last 3 visits. We have been using silver alginate. The revascularization he was supposed to have by Dr. Andree Elk at [facility] in [place] has been canceled [facility] procedure] 4/6; the right foot remains closed albeit callused. Podiatry canceled the appointment with regards to the left great toe. He has not seen Dr. Andree Elk at [facility] but thinks that Dr. Andree Elk has "done all he can do". He would be a candidate for the hemostaemix trial Readmission 07/29/2019 Mr. [name] is a man we know well from at least 3 previous stays in this clinic. He is a type II diabetic with severe PAD followed by Dr. Andree Elk at [facility] in [place]. 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 So far everything in both of these areas has remained closed. About 2 weeks ago he went to see his podiatrist at [facility] Dr. Babs Bertin. He had a thick callus on the left fifth metatarsal head that was shaved. He has developed an open wound in this area. They have been offloading this in his diabetic shoes. The patient has not had any more revascularizations by Dr. Andree Elk since the last time he was here. ABI in our clinic at the posterior tibial on the left was 1.06 10/6; no real change in the area on the plantar met head. Small wound with 2 mm of depth. He has thick skin probably from pressure around the wound. We have been using silver alginate 10/13; small wound in the left fifth plantar met head. Arrives today  with undermining laterally and purulent drainage. Our intake nurse cultured this. His wife stated they noticed a change in color over the last day or 2. He is not systemically unwell 10/19; small wound on the fifth plantar metatarsal head. Culture I did last week showed Staphylococcus lugdunensis. Although this could be a skin contaminant the possibility of a skin and soft tissue infection was there. I did give him empiric doxycycline which should have covered this. We are using silver alginate to the wound. We put him in a total contact cast today. 10/22; small wound on the fifth plantar metatarsal head. He has completed antibiotics. He also has severe PAD which worries me about just about any wound on this man's foot 10/29; small superficial area on  the fifth plantar metatarsal head. Measuring slightly smaller. He is not currently on any antibiotics. I been using a total contact cast in this man with very severe PAD but he seems to be tolerating this well 11/5; left plantar fifth metatarsal head. Silver alginate being used under a total contact cast Objective Constitutional Sitting or standing Blood Pressure is within target range for patient.. Pulse regular and within target range for patient.Marland Kitchen Respirations regular, non-labored and within target range.. Temperature is normal and within the target range for the patient.Marland Kitchen Appears in no distress. Vitals Time Taken: 10:29 AM, Height: 69 in, Weight: 210 lbs, BMI: 31, Temperature: 98.4 F, Pulse: 78 bpm, Respiratory Rate: 18 breaths/min, Blood Pressure: 104/64 mmHg, Capillary Blood Glucose: 90 mg/dl. General Notes: Wound exam; slightly smaller however under illumination that tightly adherent yellow fibrinous debris. Gentle debridement with a #3 curette there is bleeding. Hemostasis with direct pressure. No evidence of infection around the wound Integumentary (Hair, Skin) Wound #5 status is Open. Original cause of wound was Gradually Appeared.  The wound is located on the Left Metatarsal head fifth. The wound measures 0.5cm length x 0.8cm width x 0.1cm depth; 0.314cm^2 area and 0.031cm^3 volume. There is Fat Layer (Subcutaneous Tissue) Exposed exposed. There is no tunneling or undermining noted. There is a small amount of serosanguineous drainage noted. The wound margin is thickened. There is large (67-100%) pink granulation within the wound bed. There is a small (1-33%) amount of necrotic tissue within the wound bed including Adherent Slough. Assessment Active Problems ICD-10 Type 2 diabetes mellitus with foot ulcer Type 2 diabetes mellitus with diabetic peripheral angiopathy without gangrene Non-pressure chronic ulcer of other part of left foot limited to breakdown of skin Cellulitis of left lower limb Procedures Wound #5 Pre-procedure diagnosis of Wound #5 is a Diabetic Wound/Ulcer of the Lower Extremity located on the Left Metatarsal head fifth .Severity of Tissue Pre Debridement is: Fat layer exposed. There was a Excisional Skin/Subcutaneous Tissue Debridement with a total area of 0.25 sq cm performed by Ricard Dillon., MD. With the following instrument(s): Curette to remove Viable and Non-Viable tissue/material. Material removed includes Callus, Subcutaneous Tissue, and Slough after achieving pain control using Other (benzocaine, 20%). No specimens were taken. A time out was conducted at 10:57, prior to the start of the procedure. A Minimum amount of bleeding was controlled with Pressure. The procedure was tolerated well with a pain level of 0 throughout and a pain level of 0 following the procedure. Post Debridement Measurements: 0.5cm length x 0.8cm width x 0.1cm depth; 0.031cm^3 volume. Character of Wound/Ulcer Post Debridement is improved. Severity of Tissue Post Debridement is: Fat layer exposed. Post procedure Diagnosis Wound #5: Same as Pre-Procedure Pre-procedure diagnosis of Wound #5 is a Diabetic Wound/Ulcer  of the Lower Extremity located on the Left Metatarsal head fifth . There was a Total Contact Cast Procedure by Ricard Dillon., MD. Post procedure Diagnosis Wound #5: Same as Pre-Procedure Plan Follow-up Appointments: Return Appointment in 1 week. Dressing Change Frequency: Wound #5 Left Metatarsal head fifth: Do not change entire dressing for one week. Wound Cleansing: Wound #5 Left Metatarsal head fifth: May shower with protection. Primary Wound Dressing: Wound #5 Left Metatarsal head fifth: Silver Collagen - moisten with hydrogel Secondary Dressing: Wound #5 Left Metatarsal head fifth: Foam - pad the great toe. Dry Gauze Drawtex Off-Loading: Total Contact Cast to Left Lower Extremity 1. I change the primary dressing to silver collagen as the wound looked excessively dry 2. Still  under a total contact cast. 3. No evidence of infection Electronic Signature(s) Signed: 09/04/2019 5:45:12 PM By: [name] MD Entered By: [name] on 09/04/2019 11:25:43 -------------------------------------------------------------------------------- Total Contact Cast Details Patient Name: Date of Service: Todd, Guzman 09/04/2019 9:45 AM Medical Record [id number] Patient Account Number: [id number] Date of Birth/Sex: [birth year] (73 y.o. M) Treating RN: Kela Millin Primary Care Provider: [name] Other Clinician: Referring Provider: Treating Provider/Extender:Tayte Mcwherter, [name], [name] in Treatment: 5 Total Contact Cast Applied for Wound Assessment: Wound #5 Left Metatarsal head fifth Performed By: Physician Ricard Dillon., MD Post Procedure Diagnosis Same as Pre-procedure Electronic Signature(s) Signed: 09/04/2019 5:16:49 PM By: Kela Millin Signed: 09/04/2019 5:45:12 PM By: [name] MD Entered By: Kela Millin on 09/04/2019 11:01:46 -------------------------------------------------------------------------------- SuperBill  Details Patient Name: Date of Service: LARON, BOORMAN 09/04/2019 Medical Record [id number] Patient Account Number: [id number] Date of Birth/Sex: Treating RN: May 11, 1946 (73 y.o. [name] Primary Care Provider: [name] Other Clinician: Referring Provider: Treating Provider/Extender:Judyann Casasola, [name], [name] in Treatment: 5 Diagnosis Coding ICD-10 Codes Code Description E11.621 Type 2 diabetes mellitus with foot ulcer E11.51 Type 2 diabetes mellitus with diabetic peripheral angiopathy without gangrene L97.521 Non-pressure chronic ulcer of other part of left foot limited to breakdown of skin L03.116 Cellulitis of left lower limb Facility Procedures CPT4 Code: [id number] 1 Description: 8889 - DEB SUBQ TISSUE 20 SQ CM/< ICD-10 Diagnosis Description E11.621 Type 2 diabetes mellitus with foot ulcer Modifier: Quantity: 1 Physician Procedures CPT4 Code: [id number] Description: 88828 - WC PHYS SUBQ TISS 20 SQ CM 1 ICD-10 Diagnosis Description E11.621 Type 2 diabetes mellitus with foot ulcer Modifier: Quantity: Electronic Signature(s) Signed: 09/04/2019 5:45:12 PM By: [name] MD Entered By: [name] on 09/04/2019 11:26:54

## 2019-09-11 ENCOUNTER — Other Ambulatory Visit: Payer: Self-pay

## 2019-09-11 ENCOUNTER — Encounter (HOSPITAL_BASED_OUTPATIENT_CLINIC_OR_DEPARTMENT_OTHER): Payer: Medicare Other | Admitting: Internal Medicine

## 2019-09-11 DIAGNOSIS — E11621 Type 2 diabetes mellitus with foot ulcer: Secondary | ICD-10-CM | POA: Diagnosis not present

## 2019-09-11 NOTE — Progress Notes (Signed)
REECE, FEHNEL ([id number]) Visit Report for 09/11/2019 Debridement Details Patient Name: Date of Service: DAIVEON, MARKMAN 09/11/2019 11:15 AM Medical Record [id number] Patient Account Number: [id number] Date of Birth/Sex: November 16, 1945 (73 y.o. M) Treating RN: Deon Pilling Primary Care Provider: [name] Other Clinician: Referring Provider: Treating Provider/Extender:, [name], [name] in Treatment: 6 Debridement Performed for Wound #5 Left Metatarsal head fifth Assessment: Performed By: Physician Ricard Dillon., MD Debridement Type: Debridement Severity of Tissue Pre Fat layer exposed Debridement: Level of Consciousness (Pre- Awake and Alert procedure): Pre-procedure Verification/Time Out Taken: Yes - 12:10 Start Time: 12:11 Pain Control: Lidocaine 4% Topical Solution Total Area Debrided (L x W): 0.4 (cm) x 0.8 (cm) = 0.32 (cm) Tissue and other material Viable, Non-Viable, Callus, Subcutaneous, Fibrin/Exudate debrided: Level: Skin/Subcutaneous Tissue Debridement Description: Excisional Instrument: Curette Bleeding: Minimum Hemostasis Achieved: Pressure End Time: 12:17 Procedural Pain: 0 Post Procedural Pain: 0 Response to Treatment: Procedure was tolerated well Level of Consciousness Awake and Alert (Post-procedure): Post Debridement Measurements of Total Wound Length: (cm) 0.5 Width: (cm) 1 Depth: (cm) 0.2 Volume: (cm) 0.079 Character of Wound/Ulcer Post Improved Debridement: Severity of Tissue Post Debridement: Fat layer exposed Post Procedure Diagnosis Same as Pre-procedure Electronic Signature(s) Signed: 09/11/2019 5:17:30 PM By: Deon Pilling Signed: 09/11/2019 5:47:05 PM By: [name] MD Entered By: [name] on 09/11/2019 12:47:22 -------------------------------------------------------------------------------- HPI Details Patient Name: Date of Service: ONEILL, BAIS 09/11/2019 11:15 AM Medical Record  [id number] Patient Account Number: [id number] Date of Birth/Sex: 07-Dec-1945 (73 y.o. M) Treating RN: Deon Pilling Primary Care Provider: [name] Other Clinician: Referring Provider: Treating Provider/Extender:, [name], [name] in Treatment: 6 History of Present Illness HPI Description: 05/18/16; this is a 73year-old diabetic who is a type II diabetic on insulin. The history is that he traumatized his right foot developed a sore sometime in late March. 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 According to the patient and his wife this wound never really healed. He was seen on 2 occasions in the wound care center in [place] and had vascular studies and then was referred urgently to Dr. Bridgett Larsson of vascular surgery. He underwent an angiogram on 05/10/16. Unfortunately nothing really could be done to improve his vascular status. He had a 75-90% stenosis in the midsegment of 1 segment of the posterior femoral artery. He had a patent popliteal, his anterior tibial occluded shortly after takeoff. Perineal had a greater than 90% stenosis posterior tibial is occluded feet had no distal collaterals feed distal aspect of the transmetatarsal amputation site. The patient tells me that he had a prolonged period of Santyl by Dr. Doran Durand was some initial improvement but then this was stopped. I think they're only applying daily dressings/dry dressings. He has not had a recent x-ray of the right foot he did have one before his surgery in April. His wife by the dimensions of the wound/surgical site being followed at home since 4/20. At that point the dimensions were 0.5 x 12 x 0.2 on 7/19 this was 1.8 x 6 x 0.4. He is not currently on any antibiotics. His  hemoglobin A1c in early April was 12.9 at that point he was started on insulin. Apparently his blood sugars are much lower he has an appointment with Dr. Legrand Como Alteimer of endocrine  next week. 05/29/16 x-ray of the area did not show osteomyelitis. I think he probably needs an MRI at this point. His wife is asking about something called"Yireh" cream which is not FDA approved. I have not heard of this. 06/22/16; MRI did not really suggest osteomyelitis. There was minimal marrow edema and enhancement in the stump of the second metatarsal felt to be secondary likely to postoperative change rather than osteomyelitis. The patient has arranged his own consultation with Dr. Andree Elk at [place], apparently their daughter lives in [place] and has some connection here. Any improvement in vascular supply by Dr. Andree Elk would of course be helpful. Dr. Bridgett Larsson did not feel that anything further could be done other than amputation if wound care did not result in healing or if the area deteriorates. 06/26/16; the patient has been to see Dr. Andree Elk at [facility] and had an angiogram. He is going for a procedure on Thursday which will involve catheterization. I'm not sure if this is an anterograde or retrograde approach. He has been using Santyl to the wound 07/10/16; the patient had a repeat angiogram and angioplasty at [facility] by Dr. Brunetta Jeans. His angiogram showed right CFA and profundal widely patent. The right as of a.m. popliteal artery were widely patent the right anterior tibial was occluded proximally and reconstitutes at the ankle. Peroneal artery was patent to the foot. Posterior tibial artery was occluded. The patient had angioplasty of the anterior tibial artery.. This was quite successful. He was recommended for Plavix as well as aspirin. 07/17/16; the patient was close to be a nurse visit today however the outer dressing of the Apligraf fell off. Noted drainage. I was asked to see the wound. The patient is noted an odor  however his wife had noted that. Drainage with Apligraf not necessarily a bad thing. He has not been systemically unwell 07/24/16; we are still have an issue with drainage of this wound. In spite of this I applied his second Apligraf. Medially the area still is probing to bone. 08/07/16; Apligraf reapplied in general wound looks improved. 08/21/16 Apligraf #4. Wound looks much better 09/04/16 patientt's wound again today continues to appear to improve with the application of the Apligraf's. He notes no increased discomfort or concerns at this point in time. 09/18/16; the patient returns today 2 weeks after his fifth application of Apligraf. Predictably three quarters of the width of this wound has healed. The deep area that probe to bone medially is still open. The patient asked how much out-of-pocket dollars would be for additional Apligraf's. 09/25/16; now using Hydrofera Blue. He has completed 5 Apligraf applications with considerable improvement in this deep open transmetatarsal amputation site. His wound is now a triangular-shaped wound on the medial aspect. At roughly 12 to 2:00 this probes another centimeter but as opposed to in the past this does not probe to bone. The patient has been seen at [facility] by Dr. Zenia Resides. He is not planning to do any more revascularization unless the wound stalls or worsens per the patient 10/02/16; 0.7 x 0.8 x 0.8. Unfortunately although the wound looks stable to improved. There is now easily probable bone. This hasn't been present for several weeks. Patient is not otherwise symptomatic he is not experiencing any pain. I did a culture of the wound bed 10/09/16. Deterioration last week. Culture grew MRSA and although there is improvement here with doxycycline prescribed over the phone I'm going to try to get him linezolid 600 twice a day for 10 days today. 10/16/16;  he is completing a weeks worth of linezolid and still has 3 more days to go. Small triangular-shaped  open area with some degree of undermining. There is still palpable bone with a curet. Overall the area appears better than last week 10/20/16 he has completed the linezolid still having some nausea and vomiting but no diarrhea. He has exposed bone this week which is a deterioration. 10/27/16 patient now has a small but probing wound down to bone. Culture of this bone that I did last week showed a few methicillin-resistant staph aureus. I have little doubt that this represents acute/subacute osteomyelitis. The patient is currently on Doxy which I will continue he also completed 10 days of linezolid. We are now in a difficult situation with this patient's foot after considerable discussion we will send him back to see Dr. Doran Durand for a surgical opinion of this I'm also going to try to arrange a infectious disease consult at [facility] hopefully week and get this prior to her usual 4-6 weeks we having [place]. The patient clearly is going to need 6 weeks of IV vancomycin. If we cannot arrange this expediently I'll have to consider ordering this myself through a home infusion company 11/03/16; the patient now has a small in terms of circumference but probing wound. No bone palpable today. The patient remains on doxycycline 100 twice a day which should support him until he sees infectious disease at [facility] next week the following week on Wednesday I believe he has an appointment with Dr. Andree Elk at [facility] who is his vascular cardiologist. Finally he has an appointment with Dr. Doran Durand on 11/22/16 we have been using silver alginate. The patient's wife states they are having trouble getting this through [facility] 11/13/16; the patient was seen by infectious disease at [facility] in the 11th PICC line placed in preparation for IV antibiotics. A tummy he has not going to get IV vancomycin o Ceftaroline. They've also ordered an MRI. Patient has a follow-up with Dr. Doran Durand on 11/22/16 and Dr. Andree Elk at [facility]  tomorrow 11/23/16 the patient is on daptomycin as directed by infectious disease at [facility]. He is also been back to see Dr. Andree Elk at [facility]. He underwent a repeat arteriogram. He had a successful PTA of the right anterior tibial artery. He is on dual antiplatelete treatment with Plavix and aspirin. Finally he had the MRI of his foot in [place]. This showed cellulitis about the foot worse distally edema and enhancement in the reminiscent of the second metatarsal was consistent with osteomyelitis therefore what I was assuming to be the first metatarsal may be actually the second. He also has a fluid collection deep to the calcaneus at the level of the calcaneal spur which could be an abscess or due to adventitial bursitis. He had a small tear in his Achilles 11/30/16; the patient continues on daptomycin as directed by infectious disease at [facility]. He is been revascularized by Dr. Andree Elk at [facility] in [place]. He has been to see Gretta Arab who was the orthopedic surgeon who did his original amputation. I have not seen his not however per the patient's wife he did not offer another surgical local surgical prodecure to remove involved bone. He verbalized his usual disbelief in not just hyperbarics but any medical therapy for this condition(osteomyelitis). I discussed this in detail with the patient today including answering the question about a BKA definitively "curing" the current condition. 12/07/16; the patient continues on daptomycin as directed by infectious disease at [facility].  This is directed at the MRSA that we cultured from his bone debridement from 12/22. Lab work today shows a white count of 8.7 hemoglobin of 10.5 which is microcytic and hypochromic differential count shows a slightly elevated monocyte count at 1.2 eosinophilic count of 0.6. His creatinine is 1.11 sedimentation rate apparently is gone from 35-34 now 40. I explained was wife I don't think this represents a trend. His  total CK is 48 12/14/16- patient is here for follow-up evaluation of his right TMA site. He continues to receive IV daptomycin per infectious disease. His serum inflammatory markers remain elevated. He complains of intermittent pain to the medial aspect of the TMA site with intermittent erythema. He voices no complaints or concerns regarding hyperbaric therapy. Overall he and his wife are expressing a frustration and discouragement regarrding the length of time of treatment. 12/21/16; small open wound at roughly the first or second metatarsal metatarsalphalyngeal joint reminiscence of his transmetatarsal amputation site he continues to receive IV daptomycin per infectious disease at [facility]. He will finish these a week tomorrow. He has lab work which I been copied on. His white count is 10.8 hemoglobin 8.9 MCV is low at 75., MCH low at 24.5 platelet count slightly elevated at 626. Differential count shows 70% neutrophils 10% monocytes and 10% eosinophils. His comprehensive metabolic panel shows a slightly low sodium at 133 albumin low at 3.1 total CK is normal at 42 sedimentation rate is much higher at 82. He has had iron studies that show a serum iron of 15 and iron binding capacity of 238 and iron saturation of 6. This is suggestive of iron deficiency. B12 and folate were normal ferritin at 113 The patient tells me that he is not eating well and he has lost weight. He feels episodically nauseated. He is coughing and gagging on mucus which she thinks is sinusitis. He has an appointment with his primary doctor at 5:00 this afternoon in [facility] 01/02/17; the patient developed a subacute pneumonitis. He was admitted to [facility] after a CT scan showed an extensive interstitial pneumonitis [I have not yet seen this]. 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 He has since been discharged. He  is not on oxygen. He feels fatigued and very short of breath with exertion. At [facility] the wound care nurse there felt that his wound was healed. He did complete his daptomycin and has follow-up with infectious disease on Friday. X-rays I did before he went to [facility] still suggested residual osteomyelitis in the anterior aspect of the second must metatarsal head. I'm not sure I would've expected any different. There was no other findings. I actually think I did this because of erythema over the first metatarsal head reminiscent 01/11/17; the patient has eosinophilic pneumonitis. He has been reviewed by pulmonology and given clearance for hyperbarics at least that's what his wife says. I'll need to see if there is note in care everywhere. Apparently the prognosis for improvement of daptomycin induced eosinophilic granulocyte is is 3 months without steroids. In the meantime infectious disease has placed him on doxycycline until the wound is closed. He is still is lost a lot of weight and his blood sugars are running in the mid 60s to low 80s fasting and at all times during the day 01/18/17; he has had adjustments in his insulin apparently his blood sugars in the morning or over 100. He wants to restart his  hyperbaric treatment we'll do this at 1:00. 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 I think there is good reason to complete his treatments in order to give him the best chance of maintaining a healed status and these DFU 3 wounds with MRSA infection in the bone 02/15/17; the patient was seen today in conjunction with HBO. He completed hyperbaric oxygen today. The open area on his transmetatarsal site has remained closed. There was an area of erythema when I saw him earlier in the week on the posterior heel although that is resolved as of today as well. He has been using a cam walker. This is a patient who came  to [place] after a transmetatarsal amputation that was necrotic and dehisced. He required revascularization percutaneously on 2 different occasions by Dr. Andree Elk of invasive cardiology at [facility]. 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 He went to [facility] infectious disease and had IV daptomycin for 5 weeks before developing eosinophilic pneumonitis and requiring an admission to hospital/ICU. He made a good recovery and is continued on doxycycline since. As mentioned his foot is closed now. He has a follow-up with Dr. Andree Elk tomorrow. He is going to [employer] on Monday for a custom-made shoe READMISSION Last visit Dr. Andree Elk [facility] 02/16/17 1. Critical limb ischemia of the RLE: s/p transmetatarsal amputation of the RLE, now completely healed. s/p right ATA percutaneous revascularization procedure x2, most recently 11/20/2016 s/p PTA of right AT 100% to less than 20% with a 2.5 x 200 balloon. He does have some residual osteomyelitis, but given the wound is closed, the orthopedist has recommended to follow. He has a fitting for a special shoe coming up next week. He has completed a course of daptomycin and doxycycline and he has been discharged by ID. He has completed a course of hyperbaric therapy. Continue Continue medical management with aspirin, Plavix and statin therapy. We will discuss ongoing Plavix therapy at follow-up in 6 months. 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 Given no symptoms, we will conservatively manage. He doesn't want to do any more invasive studies at this time, which is reasonable. The patient arrives today out of 2 concerns both on the right transmetatarsal site. 1 at the level of the reminiscent fifth metatarsal head and the other at roughly  the first or second. Both of these look like dark subcutaneous discoloration probably subdermal bleeding. He is recently obtained new adaptive footwear for the right foot. He also has a callus on the left fifth dorsal toe however he follows with podiatry for this and I don't think this is any issue. ABIs in this clinic today were 0.66 on the right and 1.06 on the left. On entrance into our clinic initially this was 0.95 and 0.92. He does not describe current claudication. They're going away on a cruise in 8 weeks and I think are trying to do the month is much as they can proactively. They follow with podiatry and have an appointment with Dr. Andree Elk in October READMISSION 06/25/18 This is a patient that we have not seen in almost a year. He is a type II diabetic with known PAD. He is followed by Dr. Andree Elk of interventional cardiology at [facility] in [place]. He is required  revascularization for significant PAD. When we first saw him he required a transmetatarsal amputation. He had underlying osteomyelitis with a nonhealing surgical wound. This eventually closed with wound care, IV antibiotics and hyperbaric oxygen. They tell me that he has a modified shoe and he is very active walking up to 4 miles a day. He is followed by Dr. Geroge Baseman of podiatry. His wife states that he underwent a removal of callus over this site on July 9. She felt there may be drainage from this site after that although she could never really determined and there was callus buildup again. On 06/01/18 there was pressure bleeding through the overlying callus. he was given a prescription for 7 days of Bactrim. Fortuitously he has an appointment with Dr. Andree Elk on 06/04/18 and he immediately underwent revascularization of the right leg although I have not had a chance to review these records in care everywhere. This was apparently done through anterior and retrograde access. On 06/06/18 he had another debridement by Dr. Bernette Mayers. Vitamin  soaking with Epsom salts for 20 minutes and applying calcium alginate. The original trans-met was on April 2017 I believe by Dr. Doran Durand. His ABI in our clinic was noncompressible today. 07/02/18; x-ray I ordered last week was negative for osteomyelitis. Swab culture was also negative. He is going to require an MRI which I have ordered today. 07/09/18; surprisingly the MRI of the foot that I ordered did not show osteomyelitis. He did suggest the possibility of cellulitis. For this reason I'll go ahead and give him a 10 day course of doxycycline. Although the previous culture of this area was negative 07/16/18; it arrives with the wound looking much the same. Roughly the same depth. He has thick subcutaneous tissue around the wound orifice but this still has roughly the same depth. Been using silver alginate. We applied Oasis #1 today 07/23/2018; still having to remove a lot of callus and thick subcutaneous tissue to actually define the wound here. Most of this seems to have closed down yet he has a comma shaped divot over the top of the area that still I think is open. We applied Oasis #2 His wife expressed concern about the tip of his left great toe. This almost looks like a small blister. She also showed it today to her podiatrist Dr. Geroge Baseman who did not think this was anything serious. I am not sure is anything serious either however I think it bears some watching. He is not in any pain however he is insensate 07/30/2018;; still on a lot of nonviable tissue over the surface of the wound however cleaning this up reveals a more substantial wound orifice but less of a probing wound depth. This is not probed to bone. There is no evidence of infection The wife is still concerned about a non-open area on the tip of his left great toe. Almost feels like a bony outgrowth. She had previously showed this to podiatry. I do not think this is a blister. A friction area would be possible although he is really not  walking according to his wife. He had a small skin tag on his right buttock but no open wound here either 08/06/2018; we applied a third Oasis last week. Unfortunately there is really no improvement. Still requiring extensive debridement to expose the wound bed from a horizontal slitlike depression. I still have not been able to get this to fill in properly. On the positive side there is now no probable bone from when he first came  into the facility. I changed him to silver alginate today after a reasonably aggressive debridement 08/13/2018; once again the patient comes in with skin and subcutaneous tissue closing over the small probing area with the underlying cavity of the wound on the right TMA site. I applied silver alginate to this last week. Prior to that we used Oasis x3 still not able to get this area granulating. He does not have a probing area of the bone which is an improvement from when I spur started working on this and an MRI did not suggest osteomyelitis. I do not see evidence of infection here but I am increasingly concerned about why I cannot get this area to granulate. Each time I debrided this is looks like this is simply a matter of getting granulation to fill in the hole and then getting epithelialization. This does not seem to happen Also I sent him back to see podiatry Dr. Earleen Newport about the what felt to be bony outgrowth on the tip of his left great toe. Apparently after heel he left the clinic last week or the next day he developed a blood blister. He did see Dr. Earleen Newport. He went on to have a debridement of the medial nail cuticle he now has an open area here as well as some denuded skin. They did an x-ray apparently does have a bony outgrowth or spur but I am not able to look at this. They are using topical antibiotics apparently there was some suggested he use Santyl. Patient's wife was anxious for my opinion of this 08/20/2018; we are able to keep the wound open this time  instead of the thick subcutaneous tissue closing over the top of it however unfortunately once again this probes to bone. I do not see any evidence of infection and previous MRI did not show osteomyelitis. I elected to go back to the Oasis to see if we can stimulate some granulation. Last saw his vascular interventional cardiologist Dr. Andree Elk at the beginning of August and he had a repeat procedure. Nevertheless I wonder how much blood flow he has down to this area With regards to the left first toe he is seeing Dr. Earleen Newport next week. They are applying Bactroban to this area. 08/27/2018 Once again he comes in with thick eschar and subcutaneous tissue over the top of the small probing hole. This does not appear to go down to bone but it still has roughly the same depth. I reapplied Oasis today Over the left great toe there appears to be more of the wound at the tip of his toe than there was last week. This has an eschar on the surface of it. Will change to Santyl. There is seeing podiatry this afternoon 09/03/2018 He comes in today with the area on the transmetatarsal's site with a fair amount of callus, nonviable tissue over the circumference but it was not closed. I removed all of this as well as some subcutaneous debris and reapplied Oasis. There is no exposed bone The area over the tip of the left great toe started off as a nodule of uncertain etiology. He has been followed with podiatry. They have been removing part of the medial nail bed. He has nonviable tissue over the wound he been using Santyl in this area 09/10/18 Unfortunately comes in with neither wound area looking improved. The transmetatarsal amputation site once Again has nonviable debris over the surface requiring debridement. Unfortunately underneath this there is nothing that looks viable and this once again goes  right down to bone. There is no purulent drainage and no erythema. Also the surgical wound from podiatry on the left  first toe has an ischemic-looking eschar over the surface of the tip of the toe I have elected not to attempt candidly debride this His wife as arranged for him to have follow-up noninvasive studies in [place] under the care of Dr. Andree Elk clinic. The question is he has known severe PAD. They had recently seen him in August. He has had revascularizations in both legs within the last 4 or 5 months. He is not complaining of pain and I cannot really get a history of claudication. He has not systemically unwell 09/20/2018 the patient has been to [facility] and been revascularized by Dr. Andree Elk earlier this week. Apparently he was able to open up the anterior tibial artery although I have not actually seen his formal report. He is going for an attempt to revascularize on the left on Monday. He is apparently working with an investigational stent for lower extremity arteries below the knee and he has talked to the patient about placing that on Monday if possible. The patient has been using silver alginate on the transmetatarsal amputation site and Santyl on the left 09/30/2018; patient had his revascularization on the left this apparently included a standard approach as well as a more distal arterial catheterization although I do not have any information on this from Dr. Andree Elk. In fact I do not even see the initial revascularization that he had on the right. I have included the arterial history from Dr. Andree Elk last note however below; ASSESSMENT/PLAN: 1. Hx of Critical limb ischemia bilateral lower extremities, PAD: -s/p transmetatarsal amputation of the RLE. -s/p right ATA percutaneous revascularization procedure x2, most recently 11/20/2016 s/p PTA of right AT 100% to less than 20% with a 2.5 x 200 balloon. 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 -03/21/2018 s/p PTA of  90% left popliteal artery to <10% with a 5x20 cutting balloon, PTA of 90% left peroneal to <20% with a 3x20 balloon, PTA of 100% left AT to <20% with a 2.5x220 balloon. 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 Will plan on the LLE soon after. Risks/benefits of procedure have been discussed and patient has elected to proceed. We have been using endoform to the right TMA amputation site wound and Santyl to the left great toe 10/07/2018; the area on the tip of his left great toe looked better we have been using Santyl here. We continue to have a very difficult probing hole on the right TMA amputation site we have been using endoform. I went on to use his fifth Oasis today 10/14/2018; the tip of the left great toe continues to look better. We have been using Santyl here the surface however is healthy and I think we can change to an alginate. The right TMA has not changed. Once again he has no superficial opening there is callus and thick subcutaneous tissue over the orifice once you remove this there is the probing area that we have been dealing with without too much change. There is no palpable bone I have been placing Oasis here and put Oasis #6 in this today after a more vigorous debridement 10/21/18; the left  great toe still has necrotic surface requiring debridement. The right TMA site hasn't changed in view of the thick callus over the wound bed. With removal of this there is still the opening however this does not appear to have the same depth. Again there is no palpable bone. Oasis was replaced 10/28/2018; patient comes in with both wounds looking worse. The area over the first toe tip is now down to bone. The area over the TMA site is deeper and down to bone clearly with a increase in overall wound area. Equally concerning on the right TMA is the  complete absence of a pulse this week which is a change. We are not even able to Doppler this. On the right he has a noncompressible ABI greater than 1.4 11/04/2018. Both wounds look somewhat worse. X-rays showed no osteomyelitis of the right foot but on the left there was underlying osteomyelitis in the left great toe distal phalanx. I been on the phone to Dr. Andree Elk surface at [facility] in [place] and they are arranging for another angiogram on the right on Monday. I have him on doxycycline for the osteomyelitis in the left great toe for now. Infectious disease may be necessary 1/17; 2-week hiatus. Patient was admitted to hospital at [place]. My understanding is he underwent an angioplasty of the right anterior tibial artery and had stents placed in the left anterior artery and the left tibial peroneal trunk. This was done by Dr. Andree Elk of interventional radiology. There is no major change in either 1 of the wounds. They have been using Aquacel Ag. As far as they are aware no imaging studies were done of the foot which is indeed unfortunate. I had him on doxycycline for 2 weeks since we identified the osteomyelitis in the left great toe by plain x-ray. I have renewed that again today. I am still suspicious about osteomyelitis in the amputation site and would consider doing another MRI to compare with the one done in September. The idea of hyperbaric oxygen certainly comes up for discussion 1/24; no major change in either wound area. I have him on doxycycline for osteomyelitis at the tip of the left great toe. As noted he has been previously and recently revascularized by Dr. Andree Elk at [place]. He is tolerating the doxycycline well. For some reason we do not have an infectious disease consult yet. Culture of drainage from the right foot site last week was negative 1/31; MRI of the right foot did not show osteomyelitis of the right ankle and foot. Notable for a skin ulceration overlying the second  metatarsal stump with generalizing soft tissue edema of the ankle and foot consistent with cellulitis. Noted to have a partial-thickness tear of the Achilles tendon 7.5 cm proximal to the insertion Nothing really new in terms of symptoms. Patient's area on the tip of the left great toe is just about closed although he still has the probing area on the metatarsal amputation site. Appointment with Dr. Linus Salmons of infectious disease next week 2/7; Dr. Novella Olive did not feel that any further antibiotics were necessary he would follow-up in 2 months. The left great toe appears to be closed still some surface callus that I gently looked under high did not see anything open or anything that was threatening to be open. He still has the open area on the mid part of his TMA site using endoform 2/14; left great toe is closed and he is completing his doxycycline as of last Sunday. He is not on  any antibiotics. Unfortunately out of the right foot his wife noticed some subdermal hemorrhage this week. He had been walking 2 miles I had given him permission to do so this is not really a plantar wound. Using endoform to this wound but I changed to silver alginate this week 2/21; left great toe remains closed. Culture last week grew Streptococcus angiosis which I am not really familiar with however it is penicillin sensitive and I am going to put him on Augmentin. Not much change in the wound on the right foot the deep area is still probing precariously close to bone and the wound on the margin of the TMA is larger. 2/28; left great toe remains closed. He is completing the Augmentin I gave him last week. 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 This is being shown to Dr. Andree Elk at [place] to see if there is anything else that can be done here. He has been using silver alginate strips on the right 3/6; left great toe remains closed. He sees Dr. Andree Elk on Monday. The area on the plantar  aspect of the right foot has the same small orifice with thick callused tissue around this. However this time with removal of the callus tissue the wound is open all the way along the incision line to the end medially. We have been using silver alginate. 3/13; left toe remains closed although the area is callused. He sees Dr. Jacqualyn Posey of podiatry next week. The area on the plantar right foot looked a lot better this week. Culture I did of this was negative we use silver alginate. He is going next week for an attempt at revascularization by Dr. Andree Elk 3/23; left toe remains closed although the area is callused. He will see Dr. Jacqualyn Posey in follow-up. The area on the right plantar foot continues to look surprisingly better over the last 3 visits. We have been using silver alginate. The revascularization he was supposed to have by Dr. Andree Elk at [facility] in [place] has been canceled [facility] procedure] 4/6; the right foot remains closed albeit callused. Podiatry canceled the appointment with regards to the left great toe. He has not seen Dr. Andree Elk at [facility] but thinks that Dr. Andree Elk has "done all he can do". He would be a candidate for the hemostaemix trial Readmission 07/29/2019 Mr. Stann Mainland is a man we know well from at least 3 previous stays in this clinic. He is a type II diabetic with severe PAD followed by Dr. Andree Elk at [facility] in [place]. 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 So far everything in both of these areas has remained closed. About 2 weeks ago he went to see his podiat[facility]. Babs Bertin. He had a thick callus on the left fifth metatarsal head that was shaved. He has developed an open wound in this area. They have been offloading this in his diabetic shoes. The patient has not had any more revascularizations by Dr. Andree Elk since the last  time he was here. ABI in our clinic at the posterior tibial on the left was 1.06 10/6; no real change in the area on the plantar met head. Small wound with 2 mm of depth. He has thick skin probably from pressure around the wound. We have been using silver alginate 10/13; small wound in  the left fifth plantar met head. Arrives today with undermining laterally and purulent drainage. Our intake nurse cultured this. His wife stated they noticed a change in color over the last day or 2. He is not systemically unwell 10/19; small wound on the fifth plantar metatarsal head. Culture I did last week showed Staphylococcus lugdunensis. Although this could be a skin contaminant the possibility of a skin and soft tissue infection was there. I did give him empiric doxycycline which should have covered this. We are using silver alginate to the wound. We put him in a total contact cast today. 10/22; small wound on the fifth plantar metatarsal head. He has completed antibiotics. He also has severe PAD which worries me about just about any wound on this man's foot 10/29; small superficial area on the fifth plantar metatarsal head. Measuring slightly smaller. He is not currently on any antibiotics. I been using a total contact cast in this man with very severe PAD but he seems to be tolerating this well 11/5; left plantar fifth metatarsal head. Silver alginate being used under a total contact cast 11/12; wound not much different than last week. Using a #15 scalpel debridement around the wound. Still silver collagen under a total contact cast. He has severe PAD and that may be playing a role in this Electronic Signature(s) Signed: 09/11/2019 5:47:05 PM By: [name] MD Entered By: [name] on 09/11/2019 12:50:49 -------------------------------------------------------------------------------- Physical Exam Details Patient Name: Date of Service: TRAYE, BATES 09/11/2019 11:15 AM Medical Record  [id number] Patient Account Number: [id number] Date of Birth/Sex: April 17, 1946 (73 y.o. M) Treating RN: Deon Pilling Primary Care Provider: [name] Other Clinician: Referring Provider: Treating Provider/Extender:, [name], [name] in Treatment: 6 Notes Wound exam; may be slightly smaller than originally. Fibrinous debris over the surface debrided with a #3 curette also some thick callus circumference removed from around the wound. Mild amount of bleeding Electronic Signature(s) Signed: 09/11/2019 5:47:05 PM By: [name] MD Entered By: [name] on 09/11/2019 12:51:25 -------------------------------------------------------------------------------- Physician Orders Details Patient Name: Date of Service: BETTIE, SWAVELY 09/11/2019 11:15 AM Medical Record [id number] Patient Account Number: [id number] Date of Birth/Sex: [birth year] (73 y.o. M) Treating RN: Deon Pilling Primary Care Provider: [name] Other Clinician: Referring Provider: Treating Provider/Extender:, [name], [name] in Treatment: 6 Verbal / Phone Orders: No Diagnosis Coding ICD-10 Coding Code Description E11.621 Type 2 diabetes mellitus with foot ulcer E11.51 Type 2 diabetes mellitus with diabetic peripheral angiopathy without gangrene L97.521 Non-pressure chronic ulcer of other part of left foot limited to breakdown of skin L03.116 Cellulitis of left lower limb Follow-up Appointments Return Appointment in 1 week. Dressing Change Frequency Wound #5 Left Metatarsal head fifth Do not change entire dressing for one week. Wound Cleansing Wound #5 Left Metatarsal head fifth May shower with protection. Primary Wound Dressing Wound #5 Left Metatarsal head fifth Silver Collagen - moisten with hydrogel Secondary Dressing Wound #5 Left Metatarsal head fifth Foam - foam donut pad foot. Dry Gauze Off-Loading Total Contact Cast to Left Lower  Extremity Electronic Signature(s) Signed: 09/11/2019 5:17:30 PM By: Deon Pilling Signed: 09/11/2019 5:47:05 PM By: [name] MD Entered By: Deon Pilling on 09/11/2019 12:18:02 -------------------------------------------------------------------------------- Problem List Details Patient Name: Date of Service: JANET, DECESARE 09/11/2019 11:15 AM Medical Record [id number] Patient Account Number: [id number] Date of Birth/Sex: 1946/09/22 (73 y.o. M) Treating RN: Deon Pilling Primary Care Provider: [name] Other Clinician: Referring Provider: Treating Provider/Extender:, [name], [name] in Treatment: 6 Active Problems ICD-10 Evaluated Encounter  Code Description Active Date Today Diagnosis E11.621 Type 2 diabetes mellitus with foot ulcer 07/29/2019 No Yes E11.51 Type 2 diabetes mellitus with diabetic peripheral 07/29/2019 No Yes angiopathy without gangrene L97.521 Non-pressure chronic ulcer of other part of left foot 07/29/2019 No Yes limited to breakdown of skin L03.116 Cellulitis of left lower limb 08/12/2019 No Yes Inactive Problems Resolved Problems Electronic Signature(s) Signed: 09/11/2019 5:47:05 PM By: [name] MD Entered By: [name] on 09/11/2019 12:46:50 -------------------------------------------------------------------------------- Progress Note Details Patient Name: Date of Service: DONNAVIN, VANDENBRINK 09/11/2019 11:15 AM Medical Record [id number] Patient Account Number: [id number] Date of Birth/Sex: May 30, 1946 (73 y.o. M) Treating RN: Deon Pilling Primary Care Provider: [name] Other Clinician: Referring Provider: Treating Provider/Extender:, [name], [name] in Treatment: 6 Subjective History of Present Illness (HPI) 05/18/16; this is a 73year-old diabetic who is a type II diabetic on insulin. The history is that he traumatized his right foot developed a sore sometime in late March. Shortly  thereafter he went on a cruise but he had to get off the cruise ship in [place] and fly urgently back to [place] where he was admitted to [facility] and ultimately underwent a transmetatarsal amputation by Dr. 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 According to the patient and his wife this wound never really healed. He was seen on 2 occasions in the wound care center in [place] and had vascular studies and then was referred urgently to Dr. Bridgett Larsson of vascular surgery. He underwent an angiogram on 05/10/16. Unfortunately nothing really could be done to improve his vascular status. He had a 75-90% stenosis in the midsegment of 1 segment of the posterior femoral artery. He had a patent popliteal, his anterior tibial occluded shortly after takeoff. Perineal had a greater than 90% stenosis posterior tibial is occluded feet had no distal collaterals feed distal aspect of the transmetatarsal amputation site. The patient tells me that he had a prolonged period of Santyl by Dr. Doran Durand was some initial improvement but then this was stopped. I think they're only applying daily dressings/dry dressings. He has not had a recent x-ray of the right foot he did have one before his surgery in April. His wife by the dimensions of the wound/surgical site being followed at home since 4/20. At that point the dimensions were 0.5 x 12 x 0.2 on 7/19 this was 1.8 x 6 x 0.4. He is not currently on any antibiotics. His hemoglobin A1c in early April was 12.9 at that point he was started on insulin. Apparently his blood sugars are much lower he has an appointment with Dr. Legrand Como Alteimer of endocrine next week. 05/29/16 x-ray of the area did not show osteomyelitis. I think he probably needs an MRI at this point. His wife is asking about something called"Yireh" cream which is not FDA approved. I have not heard of this. 06/22/16; MRI did not really suggest osteomyelitis. There was minimal marrow edema and enhancement in  the stump of the second metatarsal felt to be secondary likely to postoperative change rather than osteomyelitis. The patient has arranged his own consultation with Dr. Andree Elk at [place], apparently their daughter lives in [place] and has some connection here. Any improvement in vascular supply by Dr. Andree Elk would of course be helpful. Dr. Bridgett Larsson did not feel that anything further could be done other than amputation if wound care did not result in healing or if the area deteriorates. 06/26/16; the patient has been to see Dr. Andree Elk at [place] and had an angiogram.  He is going for a procedure on Thursday which will involve catheterization. I'm not sure if this is an anterograde or retrograde approach. He has been using Santyl to the wound 07/10/16; the patient had a repeat angiogram and angioplasty at [place] by Dr. Brunetta Jeans. His angiogram showed right CFA and profundal widely patent. The right as of a.m. popliteal artery were widely patent the right anterior tibial was occluded proximally and reconstitutes at the ankle. Peroneal artery was patent to the foot. Posterior tibial artery was occluded. The patient had angioplasty of the anterior tibial artery.. This was quite successful. He was recommended for Plavix as well as aspirin. 07/17/16; the patient was close to be a nurse visit today however the outer dressing of the Apligraf fell off. Noted drainage. I was asked to see the wound. The patient is noted an odor however his wife had noted that. Drainage with Apligraf not necessarily a bad thing. He has not been systemically unwell 07/24/16; we are still have an issue with drainage of this wound. In spite of this I applied his second Apligraf. Medially the area still is probing to bone. 08/07/16; Apligraf reapplied in general wound looks improved. 08/21/16 Apligraf #4. Wound looks much better 09/04/16 patientt's wound again today continues to appear to improve with the application of the Apligraf's. He notes no  increased discomfort or concerns at this point in time. 09/18/16; the patient returns today 2 weeks after his fifth application of Apligraf. Predictably three quarters of the width of this wound has healed. The deep area that probe to bone medially is still open. The patient asked how much out-of-pocket dollars would be for additional Apligraf's. 09/25/16; now using Hydrofera Blue. He has completed 5 Apligraf applications with considerable improvement in this deep open transmetatarsal amputation site. His wound is now a triangular-shaped wound on the medial aspect. At roughly 12 to 2:00 this probes another centimeter but as opposed to in the past this does not probe to bone. The patient has been seen at [place] by Dr. Zenia Resides. He is not planning to do any more revascularization unless the wound stalls or worsens per the patient 10/02/16; 0.7 x 0.8 x 0.8. Unfortunately although the wound looks stable to improved. There is now easily probable bone. This hasn't been present for several weeks. Patient is not otherwise symptomatic he is not experiencing any pain. I did a culture of the wound bed 10/09/16. Deterioration last week. Culture grew MRSA and although there is improvement here with doxycycline prescribed over the phone I'm going to try to get him linezolid 600 twice a day for 10 days today. 10/16/16; he is completing a weeks worth of linezolid and still has 3 more days to go. Small triangular-shaped open area with some degree of undermining. There is still palpable bone with a curet. Overall the area appears better than last week 10/20/16 he has completed the linezolid still having some nausea and vomiting but no diarrhea. He has exposed bone this week which is a deterioration. 10/27/16 patient now has a small but probing wound down to bone. Culture of this bone that I did last week showed a few methicillin-resistant staph aureus. I have little doubt that this represents acute/subacute  osteomyelitis. The patient is currently on Doxy which I will continue he also completed 10 days of linezolid. We are now in a difficult situation with this patient's foot after considerable discussion we will send him back to see Dr. Doran Durand for a surgical opinion of this I'm  also going to try to arrange a infectious disease consult at [facility] hopefully week and get this prior to her usual 4-6 weeks we having [place]. The patient clearly is going to need 6 weeks of IV vancomycin. If we cannot arrange this expediently I'll have to consider ordering this myself through a home infusion company 11/03/16; the patient now has a small in terms of circumference but probing wound. No bone palpable today. The patient remains on doxycycline 100 twice a day which should support him until he sees infectious disease at [facility] next week the following week on Wednesday I believe he has an appointment with Dr. Andree Elk at [facility] who is his vascular cardiologist. Finally he has an appointment with Dr. Doran Durand on 11/22/16 we have been using silver alginate. The patient's wife states they are having trouble getting this through [facility] 11/13/16; the patient was seen by infectious disease at [facility] in the 11th PICC line placed in preparation for IV antibiotics. A tummy he has not going to get IV vancomycin o Ceftaroline. They've also ordered an MRI. Patient has a follow-up with Dr. Doran Durand on 11/22/16 and Dr. Andree Elk at [facility] tomorrow 11/23/16 the patient is on daptomycin as directed by infectious disease at [facility]. He is also been back to see Dr. Andree Elk at [facility]. He underwent a repeat arteriogram. He had a successful PTA of the right anterior tibial artery. He is on dual antiplatelete treatment with Plavix and aspirin. Finally he had the MRI of his foot in [place]. This showed cellulitis about the foot worse distally edema and enhancement in the reminiscent of the second metatarsal was consistent with  osteomyelitis therefore what I was assuming to be the first metatarsal may be actually the second. He also has a fluid collection deep to the calcaneus at the level of the calcaneal spur which could be an abscess or due to adventitial bursitis. He had a small tear in his Achilles 11/30/16; the patient continues on daptomycin as directed by infectious disease at [facility]. He is been revascularized by Dr. Andree Elk at [place] in [place]. He has been to see Gretta Arab who was the orthopedic surgeon who did his original amputation. I have not seen his not however per the patient's wife he did not offer another surgical local surgical prodecure to remove involved bone. He verbalized his usual disbelief in not just hyperbarics but any medical therapy for this condition(osteomyelitis). I discussed this in detail with the patient today including answering the question about a BKA definitively "curing" the current condition. 12/07/16; the patient continues on daptomycin as directed by infectious disease at [facility]. This is directed at the MRSA that we cultured from his bone debridement from 12/22. Lab work today shows a white count of 8.7 hemoglobin of 10.5 which is microcytic and hypochromic differential count shows a slightly elevated monocyte count at 1.2 eosinophilic count of 0.6. His creatinine is 1.11 sedimentation rate apparently is gone from 35-34 now 40. I explained was wife I don't think this represents a trend. His total CK is 48 12/14/16- patient is here for follow-up evaluation of his right TMA site. He continues to receive IV daptomycin per infectious disease. His serum inflammatory markers remain elevated. He complains of intermittent pain to the medial aspect of the TMA site with intermittent erythema. He voices no complaints or concerns regarding hyperbaric therapy. Overall he and his wife are expressing a frustration and discouragement regarrding the length of time of treatment. 12/21/16; small  open wound  at roughly the first or second metatarsal metatarsalphalyngeal joint reminiscence of his transmetatarsal amputation site he continues to receive IV daptomycin per infectious disease at [facility]. He will finish these a week tomorrow. He has lab work which I been copied on. His white count is 10.8 hemoglobin 8.9 MCV is low at 75., MCH low at 24.5 platelet count slightly elevated at 626. Differential count shows 70% neutrophils 10% monocytes and 10% eosinophils. His comprehensive metabolic panel shows a slightly low sodium at 133 albumin low at 3.1 total CK is normal at 42 sedimentation rate is much higher at 82. He has had iron studies that show a serum iron of 15 and iron binding capacity of 238 and iron saturation of 6. This is suggestive of iron deficiency. B12 and folate were normal ferritin at 113 The patient tells me that he is not eating well and he has lost weight. He feels episodically nauseated. He is coughing and gagging on mucus which she thinks is sinusitis. He has an appointment with his primary doctor at 5:00 this afternoon in [facility] 01/02/17; the patient developed a subacute pneumonitis. He was admitted to [facility] after a CT scan showed an extensive interstitial pneumonitis [I have not yet seen this]. 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 He has since been discharged. He is not on oxygen. He feels fatigued and very short of breath with exertion. At [facility] the wound care nurse there felt that his wound was healed. He did complete his daptomycin and has follow-up with infectious disease on Friday. X-rays I did before he went to [facility] still suggested residual osteomyelitis in the anterior aspect of the second must metatarsal head. I'm not sure I would've expected any different. There was no other findings. I actually think I did this because of erythema over  the first metatarsal head reminiscent 01/11/17; the patient has eosinophilic pneumonitis. He has been reviewed by pulmonology and given clearance for hyperbarics at least that's what his wife says. I'll need to see if there is note in care everywhere. Apparently the prognosis for improvement of daptomycin induced eosinophilic granulocyte is is 3 months without steroids. In the meantime infectious disease has placed him on doxycycline until the wound is closed. He is still is lost a lot of weight and his blood sugars are running in the mid 60s to low 80s fasting and at all times during the day 01/18/17; he has had adjustments in his insulin apparently his blood sugars in the morning or over 100. He wants to restart his hyperbaric treatment we'll do this at 1:00. 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 I think there is good reason to complete his treatments in order to give him the best chance of maintaining a healed status and these DFU 3 wounds with MRSA infection in the bone 02/15/17; the patient was seen today in conjunction with HBO. He completed hyperbaric oxygen today. The open area on his transmetatarsal site has remained closed. There was an area of erythema when I saw him earlier in the week on the posterior heel although that is resolved as of today as well. He has been using a cam walker. This is a patient who came to [place] after a transmetatarsal amputation that was necrotic and dehisced. He required revascularization percutaneously on 2 different occasions by Dr. Andree Elk of invasive cardiology at  [facility]. 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 He went to [facility] infectious disease and had IV daptomycin for 5 weeks before developing eosinophilic pneumonitis and requiring an admission to hospital/ICU. He made a  good recovery and is continued on doxycycline since. As mentioned his foot is closed now. He has a follow-up with Dr. Andree Elk tomorrow. He is going to [employer] on Monday for a custom-made shoe READMISSION Last visit Dr. Andree Elk [facility] 02/16/17 1. Critical limb ischemia of the RLE:  s/p transmetatarsal amputation of the RLE, now completely healed.  s/p right ATA percutaneous revascularization procedure x2, most recently 11/20/2016 s/p PTA of right AT 100% to less than 20% with a 2.5 x 200 balloon.  He does have some residual osteomyelitis, but given the wound is closed, the orthopedist has recommended to follow. He has a fitting for a special shoe coming up next week. He has completed a course of daptomycin and doxycycline and he has been discharged by ID. He has completed a course of hyperbaric therapy.  Continue Continue medical management with aspirin, Plavix and statin therapy. We will discuss ongoing Plavix therapy at follow-up in 6 months.  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 Given no symptoms, we will conservatively manage. He doesn't want to do any more invasive studies at this time, which is reasonable. The patient arrives today out of 2 concerns both on the right transmetatarsal site. 1 at the level of the reminiscent fifth metatarsal head and the other at roughly the first or second. Both of these look like dark subcutaneous discoloration probably subdermal bleeding. He is recently obtained new adaptive footwear for the right foot. He also has a callus on the left fifth dorsal toe however he follows with podiatry for this and I don't think this is any issue. ABIs in this clinic today were 0.66 on the right and 1.06 on the left. On entrance into our clinic initially this was 0.95 and 0.92. He does not describe current claudication.  They're going away on a cruise in 8 weeks and I think are trying to do the month is much as they can proactively. They follow with podiatry and have an appointment with Dr. Andree Elk in October READMISSION 06/25/18 This is a patient that we have not seen in almost a year. He is a type II diabetic with known PAD. He is followed by Dr. Andree Elk of interventional cardiology at [facility] in [place]. He is required revascularization for significant PAD. When we first saw him he required a transmetatarsal amputation. He had underlying osteomyelitis with a nonhealing surgical wound. This eventually closed with wound care, IV antibiotics and hyperbaric oxygen. They tell me that he has a modified shoe and he is very active walking up to 4 miles a day. He is followed by Dr. Geroge Baseman of podiatry. His wife states that he underwent a removal of callus over this site on July 9. She felt there may be drainage from this site after that although she could never really determined and there was callus buildup again. On 06/01/18 there was pressure bleeding through the overlying callus. he was given a prescription for 7 days of Bactrim. Fortuitously he has an appointment with Dr. Andree Elk on 06/04/18 and he immediately underwent revascularization of the right leg although I have not  had a chance to review these records in care everywhere. This was apparently done through anterior and retrograde access. On 06/06/18 he had another debridement by Dr. Bernette Mayers. Vitamin soaking with Epsom salts for 20 minutes and applying calcium alginate. The original trans-met was on April 2017 I believe by Dr. Doran Durand. His ABI in our clinic was noncompressible today. 07/02/18; x-ray I ordered last week was negative for osteomyelitis. Swab culture was also negative. He is going to require an MRI which I have ordered today. 07/09/18; surprisingly the MRI of the foot that I ordered did not show osteomyelitis. He did suggest the possibility of cellulitis. For  this reason I'll go ahead and give him a 10 day course of doxycycline. Although the previous culture of this area was negative 07/16/18; it arrives with the wound looking much the same. Roughly the same depth. He has thick subcutaneous tissue around the wound orifice but this still has roughly the same depth. Been using silver alginate. We applied Oasis #1 today 07/23/2018; still having to remove a lot of callus and thick subcutaneous tissue to actually define the wound here. Most of this seems to have closed down yet he has a comma shaped divot over the top of the area that still I think is open. We applied Oasis #2 His wife expressed concern about the tip of his left great toe. This almost looks like a small blister. She also showed it today to her podiatrist Dr. Geroge Baseman who did not think this was anything serious. I am not sure is anything serious either however I think it bears some watching. He is not in any pain however he is insensate 07/30/2018;; still on a lot of nonviable tissue over the surface of the wound however cleaning this up reveals a more substantial wound orifice but less of a probing wound depth. This is not probed to bone. There is no evidence of infection The wife is still concerned about a non-open area on the tip of his left great toe. Almost feels like a bony outgrowth. She had previously showed this to podiatry. I do not think this is a blister. A friction area would be possible although he is really not walking according to his wife. He had a small skin tag on his right buttock but no open wound here either 08/06/2018; we applied a third Oasis last week. Unfortunately there is really no improvement. Still requiring extensive debridement to expose the wound bed from a horizontal slitlike depression. I still have not been able to get this to fill in properly. On the positive side there is now no probable bone from when he first came into the facility. I changed him to  silver alginate today after a reasonably aggressive debridement 08/13/2018; once again the patient comes in with skin and subcutaneous tissue closing over the small probing area with the underlying cavity of the wound on the right TMA site. I applied silver alginate to this last week. Prior to that we used Oasis x3 still not able to get this area granulating. He does not have a probing area of the bone which is an improvement from when I spur started working on this and an MRI did not suggest osteomyelitis. I do not see evidence of infection here but I am increasingly concerned about why I cannot get this area to granulate. Each time I debrided this is looks like this is simply a matter of getting granulation to fill in the hole and then getting epithelialization. This  does not seem to happen Also I sent him back to see podiatry Dr. Earleen Newport about the what felt to be bony outgrowth on the tip of his left great toe. Apparently after heel he left the clinic last week or the next day he developed a blood blister. He did see Dr. Earleen Newport. He went on to have a debridement of the medial nail cuticle he now has an open area here as well as some denuded skin. They did an x-ray apparently does have a bony outgrowth or spur but I am not able to look at this. They are using topical antibiotics apparently there was some suggested he use Santyl. Patient's wife was anxious for my opinion of this 08/20/2018; we are able to keep the wound open this time instead of the thick subcutaneous tissue closing over the top of it however unfortunately once again this probes to bone. I do not see any evidence of infection and previous MRI did not show osteomyelitis. I elected to go back to the Oasis to see if we can stimulate some granulation. Last saw his vascular interventional cardiologist Dr. Andree Elk at the beginning of August and he had a repeat procedure. Nevertheless I wonder how much blood flow he has down to this  area With regards to the left first toe he is seeing Dr. Earleen Newport next week. They are applying Bactroban to this area. 08/27/2018 ooOnce again he comes in with thick eschar and subcutaneous tissue over the top of the small probing hole. This does not appear to go down to bone but it still has roughly the same depth. I reapplied Oasis today ooOver the left great toe there appears to be more of the wound at the tip of his toe than there was last week. This has an eschar on the surface of it. Will change to Santyl. There is seeing podiatry this afternoon 09/03/2018 ooHe comes in today with the area on the transmetatarsal's site with a fair amount of callus, nonviable tissue over the circumference but it was not closed. I removed all of this as well as some subcutaneous debris and reapplied Oasis. There is no exposed bone ooThe area over the tip of the left great toe started off as a nodule of uncertain etiology. He has been followed with podiatry. They have been removing part of the medial nail bed. He has nonviable tissue over the wound he been using Santyl in this area 09/10/18 ooUnfortunately comes in with neither wound area looking improved. The transmetatarsal amputation site once Again has nonviable debris over the surface requiring debridement. Unfortunately underneath this there is nothing that looks viable and this once again goes right down to bone. There is no purulent drainage and no erythema. ooAlso the surgical wound from podiatry on the left first toe has an ischemic-looking eschar over the surface of the tip of the toe I have elected not to attempt candidly debride this ooHis wife as arranged for him to have follow-up noninvasive studies in [place] under the care of Dr. Andree Elk clinic. The question is he has known severe PAD. They had recently seen him in August. He has had revascularizations in both legs within the last 4 or 5 months. He is not complaining of pain and I cannot  really get a history of claudication. He has not systemically unwell 09/20/2018 the patient has been to [facility] and been revascularized by Dr. Andree Elk earlier this week. Apparently he was able to open up the anterior tibial artery although I  have not actually seen his formal report. He is going for an attempt to revascularize on the left on Monday. He is apparently working with an investigational stent for lower extremity arteries below the knee and he has talked to the patient about placing that on Monday if possible. The patient has been using silver alginate on the transmetatarsal amputation site and Santyl on the left 09/30/2018; patient had his revascularization on the left this apparently included a standard approach as well as a more distal arterial catheterization although I do not have any information on this from Dr. Andree Elk. In fact I do not even see the initial revascularization that he had on the right. I have included the arterial history from Dr. Andree Elk last note however below; ASSESSMENT/PLAN: 1. Hx of Critical limb ischemia bilateral lower extremities, PAD: -s/p transmetatarsal amputation of the RLE. -s/p right ATA percutaneous revascularization procedure x2, most recently 11/20/2016 s/p PTA of right AT 100% to less than 20% with a 2.5 x 200 balloon. 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 -03/21/2018 s/p PTA of 90% left popliteal artery to <10% with a 5x20 cutting balloon, PTA of 90% left peroneal to <20% with a 3x20 balloon, PTA of 100% left AT to <20% with a 2.5x220 balloon. 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 Will plan on the LLE soon after. Risks/benefits of procedure have been discussed  and patient has elected to proceed. We have been using endoform to the right TMA amputation site wound and Santyl to the left great toe 10/07/2018; the area on the tip of his left great toe looked better we have been using Santyl here. We continue to have a very difficult probing hole on the right TMA amputation site we have been using endoform. I went on to use his fifth Oasis today 10/14/2018; the tip of the left great toe continues to look better. We have been using Santyl here the surface however is healthy and I think we can change to an alginate. The right TMA has not changed. Once again he has no superficial opening there is callus and thick subcutaneous tissue over the orifice once you remove this there is the probing area that we have been dealing with without too much change. There is no palpable bone I have been placing Oasis here and put Oasis #6 in this today after a more vigorous debridement 10/21/18; the left great toe still has necrotic surface requiring debridement. The right TMA site hasn't changed in view of the thick callus over the wound bed. With removal of this there is still the opening however this does not appear to have the same depth. Again there is no palpable bone. Oasis was replaced 10/28/2018; patient comes in with both wounds looking worse. The area over the first toe tip is now down to bone. The area over the TMA site is deeper and down to bone clearly with a increase in overall wound area. Equally concerning on the right TMA is the complete absence of a pulse this week which is a change. We are not even able to Doppler this. On the right he has a noncompressible ABI greater than 1.4 11/04/2018. Both wounds look somewhat worse. X-rays showed no osteomyelitis of the right foot  but on the left there was underlying osteomyelitis in the left great toe distal phalanx. I been on the phone to Dr. Andree Elk surface at [facility] in [place] and they are arranging for another  angiogram on the right on Monday. I have him on doxycycline for the osteomyelitis in the left great toe for now. Infectious disease may be necessary 1/17; 2-week hiatus. Patient was admitted to hospital at [place]. My understanding is he underwent an angioplasty of the right anterior tibial artery and had stents placed in the left anterior artery and the left tibial peroneal trunk. This was done by Dr. Andree Elk of interventional radiology. There is no major change in either 1 of the wounds. They have been using Aquacel Ag. As far as they are aware no imaging studies were done of the foot which is indeed unfortunate. I had him on doxycycline for 2 weeks since we identified the osteomyelitis in the left great toe by plain x-ray. I have renewed that again today. I am still suspicious about osteomyelitis in the amputation site and would consider doing another MRI to compare with the one done in September. The idea of hyperbaric oxygen certainly comes up for discussion 1/24; no major change in either wound area. I have him on doxycycline for osteomyelitis at the tip of the left great toe. As noted he has been previously and recently revascularized by Dr. Andree Elk at [place]. He is tolerating the doxycycline well. For some reason we do not have an infectious disease consult yet. Culture of drainage from the right foot site last week was negative 1/31; MRI of the right foot did not show osteomyelitis of the right ankle and foot. Notable for a skin ulceration overlying the second metatarsal stump with generalizing soft tissue edema of the ankle and foot consistent with cellulitis. Noted to have a partial-thickness tear of the Achilles tendon 7.5 cm proximal to the insertion Nothing really new in terms of symptoms. Patient's area on the tip of the left great toe is just about closed although he still has the probing area on the metatarsal amputation site. Appointment with Dr. Linus Salmons of infectious disease next week 2/7;  Dr. Novella Olive did not feel that any further antibiotics were necessary he would follow-up in 2 months. The left great toe appears to be closed still some surface callus that I gently looked under high did not see anything open or anything that was threatening to be open. He still has the open area on the mid part of his TMA site using endoform 2/14; left great toe is closed and he is completing his doxycycline as of last Sunday. He is not on any antibiotics. Unfortunately out of the right foot his wife noticed some subdermal hemorrhage this week. He had been walking 2 miles I had given him permission to do so this is not really a plantar wound. Using endoform to this wound but I changed to silver alginate this week 2/21; left great toe remains closed. Culture last week grew Streptococcus angiosis which I am not really familiar with however it is penicillin sensitive and I am going to put him on Augmentin. Not much change in the wound on the right foot the deep area is still probing precariously close to bone and the wound on the margin of the TMA is larger. 2/28; left great toe remains closed. He is completing the Augmentin I gave him last week. 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 This is being  shown to Dr. Andree Elk at [facility] to see if there is anything else that can be done here. He has been using silver alginate strips on the right 3/6; left great toe remains closed. He sees Dr. Andree Elk on Monday. The area on the plantar aspect of the right foot has the same small orifice with thick callused tissue around this. However this time with removal of the callus tissue the wound is open all the way along the incision line to the end medially. We have been using silver alginate. 3/13; left toe remains closed although the area is callused. He sees Dr. Jacqualyn Posey of podiatry next week. The area on the plantar right foot looked a lot better this week. Culture I did of this was  negative we use silver alginate. He is going next week for an attempt at revascularization by Dr. Andree Elk 3/23; left toe remains closed although the area is callused. He will see Dr. Jacqualyn Posey in follow-up. The area on the right plantar foot continues to look surprisingly better over the last 3 visits. We have been using silver alginate. The revascularization he was supposed to have by Dr. Andree Elk at [facility] in [place] has been canceled [facility] procedure] 4/6; the right foot remains closed albeit callused. Podiatry canceled the appointment with regards to the left great toe. He has not seen Dr. Andree Elk at [facility] but thinks that Dr. Andree Elk has "done all he can do". He would be a candidate for the hemostaemix trial Readmission 07/29/2019 Mr. Stann Mainland is a man we know well from at least 3 previous stays in this clinic. He is a type II diabetic with severe PAD followed by Dr. Andree Elk at [facility] in [place]. 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 So far everything in both of these areas has remained closed. About 2 weeks ago he went to see his podiatrist at [facility] Dr. Babs Bertin. He had a thick callus on the left fifth metatarsal head that was shaved. He has developed an open wound in this area. They have been offloading this in his diabetic shoes. The patient has not had any more revascularizations by Dr. Andree Elk since the last time he was here. ABI in our clinic at the posterior tibial on the left was 1.06 10/6; no real change in the area on the plantar met head. Small wound with 2 mm of depth. He has thick skin probably from pressure around the wound. We have been using silver alginate 10/13; small wound in the left fifth plantar met head. Arrives today with undermining laterally and purulent drainage. Our intake nurse cultured this. His wife stated they noticed a  change in color over the last day or 2. He is not systemically unwell 10/19; small wound on the fifth plantar metatarsal head. Culture I did last week showed Staphylococcus lugdunensis. Although this could be a skin contaminant the possibility of a skin and soft tissue infection was there. I did give him empiric doxycycline which should have covered this. We are using silver alginate to the wound. We put him in a total contact cast today. 10/22; small wound on the fifth plantar metatarsal head. He has completed antibiotics. He also has severe PAD which worries me about just about any wound on this man's foot 10/29; small superficial area on the fifth plantar metatarsal head. Measuring slightly smaller.  He is not currently on any antibiotics. I been using a total contact cast in this man with very severe PAD but he seems to be tolerating this well 11/5; left plantar fifth metatarsal head. Silver alginate being used under a total contact cast 11/12; wound not much different than last week. Using a #15 scalpel debridement around the wound. Still silver collagen under a total contact cast. He has severe PAD and that may be playing a role in this Objective Constitutional Vitals Time Taken: 11:21 AM, Height: 69 in, Weight: 210 lbs, BMI: 31, Temperature: 98.4 F, Pulse: 85 bpm, Respiratory Rate: 18 breaths/min, Blood Pressure: 131/75 mmHg, Capillary Blood Glucose: 105 mg/dl. Integumentary (Hair, Skin) Wound #5 status is Open. Original cause of wound was Gradually Appeared. The wound is located on the Left Metatarsal head fifth. The wound measures 0.4cm length x 0.8cm width x 0.1cm depth; 0.251cm^2 area and 0.025cm^3 volume. There is Fat Layer (Subcutaneous Tissue) Exposed exposed. There is no tunneling or undermining noted. There is a small amount of serosanguineous drainage noted. The wound margin is thickened. There is large (67-100%) pink granulation within the wound bed. There is a small (1-33%)  amount of necrotic tissue within the wound bed including Adherent Slough. Assessment Active Problems ICD-10 Type 2 diabetes mellitus with foot ulcer Type 2 diabetes mellitus with diabetic peripheral angiopathy without gangrene Non-pressure chronic ulcer of other part of left foot limited to breakdown of skin Cellulitis of left lower limb Procedures Wound #5 Pre-procedure diagnosis of Wound #5 is a Diabetic Wound/Ulcer of the Lower Extremity located on the Left Metatarsal head fifth .Severity of Tissue Pre Debridement is: Fat layer exposed. There was a Excisional Skin/Subcutaneous Tissue Debridement with a total area of 0.32 sq cm performed by Ricard Dillon., MD. With the following instrument(s): Curette to remove Viable and Non-Viable tissue/material. Material removed includes Callus, Subcutaneous Tissue, and Fibrin/Exudate after achieving pain control using Lidocaine 4% Topical Solution. A time out was conducted at 12:10, prior to the start of the procedure. A Minimum amount of bleeding was controlled with Pressure. The procedure was tolerated well with a pain level of 0 throughout and a pain level of 0 following the procedure. Post Debridement Measurements: 0.5cm length x 1cm width x 0.2cm depth; 0.079cm^3 volume. Character of Wound/Ulcer Post Debridement is improved. Severity of Tissue Post Debridement is: Fat layer exposed. Post procedure Diagnosis Wound #5: Same as Pre-Procedure Pre-procedure diagnosis of Wound #5 is a Diabetic Wound/Ulcer of the Lower Extremity located on the Left Metatarsal head fifth . There was a Total Contact Cast Procedure by Ricard Dillon., MD. Post procedure Diagnosis Wound #5: Same as Pre-Procedure Plan Follow-up Appointments: Return Appointment in 1 week. Dressing Change Frequency: Wound #5 Left Metatarsal head fifth: Do not change entire dressing for one week. Wound Cleansing: Wound #5 Left Metatarsal head fifth: May shower with  protection. Primary Wound Dressing: Wound #5 Left Metatarsal head fifth: Silver Collagen - moisten with hydrogel Secondary Dressing: Wound #5 Left Metatarsal head fifth: Foam - foam donut pad foot. Dry Gauze Off-Loading: Total Contact Cast to Left Lower Extremity 1. Continue with silver collagen moistened, foam, dry gauze with a total contact cast reapplied today. Electronic Signature(s) Signed: 09/11/2019 5:47:05 PM By: [name] MD Entered By: [name] on 09/11/2019 12:52:37 -------------------------------------------------------------------------------- Total Contact Cast Details Patient Name: Date of Service: TREJAN, BUDA 09/11/2019 11:15 AM Medical Record [id number] Patient Account Number: [id number] Date of Birth/Sex: 1946/07/13 (73 y.o. M) Treating RN: Deon Pilling Primary Care  Provider: [name] Other Clinician: Referring Provider: Treating Provider/Extender:, [name], [name] in Treatment: 6 Total Contact Cast Applied for Wound Assessment: Wound #5 Left Metatarsal head fifth Performed By: Physician Ricard Dillon., MD Post Procedure Diagnosis Same as Pre-procedure Electronic Signature(s) Signed: 09/11/2019 5:17:30 PM By: Deon Pilling Signed: 09/11/2019 5:47:05 PM By: [name] MD Entered By: Deon Pilling on 09/11/2019 12:18:19 -------------------------------------------------------------------------------- SuperBill Details Patient Name: Date of Service: CRUZITO, STANDRE 09/11/2019 Medical Record [id number] Patient Account Number: [id number] Date of Birth/Sex: Treating RN: 04-28-1946 (73 y.o. Lorette Ang, Meta.Reding Primary Care Provider: [name] Other Clinician: Referring Provider: Treating Provider/Extender:, [name], [name] in Treatment: 6 Diagnosis Coding ICD-10 Codes Code Description E11.621 Type 2 diabetes mellitus with foot ulcer E11.51 Type 2 diabetes mellitus with diabetic  peripheral angiopathy without gangrene L97.521 Non-pressure chronic ulcer of other part of left foot limited to breakdown of skin L03.116 Cellulitis of left lower limb Facility Procedures CPT4 Code: [id number] 1 Description: 9977 - DEB SUBQ TISSUE 20 SQ CM/< ICD-10 Diagnosis Description E11.621 Type 2 diabetes mellitus with foot ulcer Modifier: Quantity: 1 Physician Procedures CPT4 Code: [id number] Description: 32023 - WC PHYS SUBQ TISS 20 SQ CM ICD-10 Diagnosis Description E11.621 Type 2 diabetes mellitus with foot ulcer Modifier: Quantity: 1 Electronic Signature(s) Signed: 09/11/2019 5:47:05 PM By: [name] MD Entered By: [name] on 09/11/2019 12:52:49

## 2019-09-18 ENCOUNTER — Other Ambulatory Visit: Payer: Self-pay

## 2019-09-18 ENCOUNTER — Encounter (HOSPITAL_BASED_OUTPATIENT_CLINIC_OR_DEPARTMENT_OTHER): Payer: Medicare Other | Admitting: Internal Medicine

## 2019-09-18 DIAGNOSIS — E11621 Type 2 diabetes mellitus with foot ulcer: Secondary | ICD-10-CM | POA: Diagnosis not present

## 2019-09-18 NOTE — Progress Notes (Signed)
[name], [name] ([id number]) Visit Report for 09/18/2019 Debridement Details Patient Name: Date of Service: [name], [name] 09/18/2019 12:30 PM Medical Record [id number] Patient Account Number: [id number] Date of Birth/Sex: 06-03-1946 (73 y.o. M) Treating RN: Deon Pilling Primary Care Provider: [name] Other Clinician: Referring Provider: Treating Provider/Extender:Shiloh Southern, [name], [name] in Treatment: 7 Debridement Performed for Wound #5 Left Metatarsal head fifth Assessment: Performed By: Physician Ricard Dillon., MD Debridement Type: Debridement Severity of Tissue Pre Fat layer exposed Debridement: Level of Consciousness (Pre- Awake and Alert procedure): Pre-procedure Verification/Time Out Taken: Yes - 13:28 Start Time: 13:29 Pain Control: Lidocaine 4% Topical Solution Total Area Debrided (L x W): 0.6 (cm) x 0.6 (cm) = 0.36 (cm) Tissue and other material Viable, Non-Viable, Slough, Subcutaneous, Skin: Dermis , Fibrin/Exudate, Slough debrided: Level: Skin/Subcutaneous Tissue Debridement Description: Excisional Instrument: Curette Bleeding: Minimum Hemostasis Achieved: Pressure End Time: 13:33 Procedural Pain: 0 Post Procedural Pain: 0 Response to Treatment: Procedure was tolerated well Level of Consciousness Awake and Alert (Post-procedure): Post Debridement Measurements of Total Wound Length: (cm) 0.6 Width: (cm) 0.6 Depth: (cm) 0.1 Volume: (cm) 0.028 Character of Wound/Ulcer Post Improved Debridement: Severity of Tissue Post Debridement: Fat layer exposed Post Procedure Diagnosis Same as Pre-procedure Electronic Signature(s) Signed: 09/18/2019 6:25:25 PM By: [name] MD Signed: 09/18/2019 6:38:14 PM By: Deon Pilling Entered By: [name] on 09/18/2019 13:56:59 -------------------------------------------------------------------------------- HPI Details Patient Name: Date of Service: [name], [name] 09/18/2019 12:30  PM Medical Record [id number] Patient Account Number: [id number] Date of Birth/Sex: [birth year] (73 y.o. M) Treating RN: Deon Pilling Primary Care Provider: [name] Other Clinician: Referring Provider: Treating Provider/Extender:Jumaane Weatherford, [name], [name] in Treatment: 7 History of Present Illness HPI Description: 05/18/16; this is a 73year-old diabetic who is a type II diabetic on insulin. The history is that he traumatized his right foot developed a sore sometime in late March. 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 According to the patient and his wife this wound never really healed. He was seen on 2 occasions in the wound care center in [place] and had vascular studies and then was referred urgently to Dr. Bridgett Larsson of vascular surgery. He underwent an angiogram on 05/10/16. Unfortunately nothing really could be done to improve his vascular status. He had a 75-90% stenosis in the midsegment of 1 segment of the posterior femoral artery. He had a patent popliteal, his anterior tibial occluded shortly after takeoff. Perineal had a greater than 90% stenosis posterior tibial is occluded feet had no distal collaterals feed distal aspect of the transmetatarsal amputation site. The patient tells me that he had a prolonged period of Santyl by Dr. Doran Durand was some initial improvement but then this was stopped. I think they're only applying daily dressings/dry dressings. He has not had a recent x-ray of the right foot he did have one before his surgery in April. His wife by the dimensions of the wound/surgical site being followed at home since 4/20. At that point the dimensions were 0.5 x 12 x 0.2 on 7/19 this was 1.8 x 6 x 0.4. He is not currently on any  antibiotics. His hemoglobin A1c in early April was 12.9 at that point he was started on insulin. Apparently his blood sugars are much lower he has an appointment with Dr.  Legrand Como Alteimer of endocrine next week. 05/29/16 x-ray of the area did not show osteomyelitis. I think he probably needs an MRI at this point. His wife is asking about something called"Yireh" cream which is not FDA approved. I have not heard of this. 06/22/16; MRI did not really suggest osteomyelitis. There was minimal marrow edema and enhancement in the stump of the second metatarsal felt to be secondary likely to postoperative change rather than osteomyelitis. The patient has arranged his own consultation with Dr. Andree Elk at [place], apparently their daughter lives in [place] and has some connection here. Any improvement in vascular supply by Dr. Andree Elk would of course be helpful. Dr. Bridgett Larsson did not feel that anything further could be done other than amputation if wound care did not result in healing or if the area deteriorates. 06/26/16; the patient has been to see Dr. Andree Elk at [facility] and had an angiogram. He is going for a procedure on Thursday which will involve catheterization. I'm not sure if this is an anterograde or retrograde approach. He has been using Santyl to the wound 07/10/16; the patient had a repeat angiogram and angioplasty at [facility] by Dr. Brunetta Jeans. His angiogram showed right CFA and profundal widely patent. The right as of a.m. popliteal artery were widely patent the right anterior tibial was occluded proximally and reconstitutes at the ankle. Peroneal artery was patent to the foot. Posterior tibial artery was occluded. The patient had angioplasty of the anterior tibial artery.. This was quite successful. He was recommended for Plavix as well as aspirin. 07/17/16; the patient was close to be a nurse visit today however the outer dressing of the Apligraf fell off. Noted drainage. I was asked to see the wound. The patient is  noted an odor however his wife had noted that. Drainage with Apligraf not necessarily a bad thing. He has not been systemically unwell 07/24/16; we are still have an issue with drainage of this wound. In spite of this I applied his second Apligraf. Medially the area still is probing to bone. 08/07/16; Apligraf reapplied in general wound looks improved. 08/21/16 Apligraf #4. Wound looks much better 09/04/16 patientt's wound again today continues to appear to improve with the application of the Apligraf's. He notes no increased discomfort or concerns at this point in time. 09/18/16; the patient returns today 2 weeks after his fifth application of Apligraf. Predictably three quarters of the width of this wound has healed. The deep area that probe to bone medially is still open. The patient asked how much out-of-pocket dollars would be for additional Apligraf's. 09/25/16; now using Hydrofera Blue. He has completed 5 Apligraf applications with considerable improvement in this deep open transmetatarsal amputation site. His wound is now a triangular-shaped wound on the medial aspect. At roughly 12 to 2:00 this probes another centimeter but as opposed to in the past this does not probe to bone. The patient has been seen at [facility] by Dr. Zenia Resides. He is not planning to do any more revascularization unless the wound stalls or worsens per the patient 10/02/16; 0.7 x 0.8 x 0.8. Unfortunately although the wound looks stable to improved. There is now easily probable bone. This hasn't been present for several weeks. Patient is not otherwise symptomatic he is not experiencing any pain. I did a culture of the wound bed 10/09/16. Deterioration last week. Culture grew MRSA and although there is improvement here with doxycycline prescribed over the phone I'm going to try to get him linezolid 600 twice a day for  10 days today. 10/16/16; he is completing a weeks worth of linezolid and still has 3 more days to go. Small  triangular-shaped open area with some degree of undermining. There is still palpable bone with a curet. Overall the area appears better than last week 10/20/16 he has completed the linezolid still having some nausea and vomiting but no diarrhea. He has exposed bone this week which is a deterioration. 10/27/16 patient now has a small but probing wound down to bone. Culture of this bone that I did last week showed a few methicillin-resistant staph aureus. I have little doubt that this represents acute/subacute osteomyelitis. The patient is currently on Doxy which I will continue he also completed 10 days of linezolid. We are now in a difficult situation with this patient's foot after considerable discussion we will send him back to see Dr. Doran Durand for a surgical opinion of this I'm also going to try to arrange a infectious disease consult at [facility] hopefully week and get this prior to her usual 4-6 weeks we having [place]. The patient clearly is going to need 6 weeks of IV vancomycin. If we cannot arrange this expediently I'll have to consider ordering this myself through a home infusion company 11/03/16; the patient now has a small in terms of circumference but probing wound. No bone palpable today. The patient remains on doxycycline 100 twice a day which should support him until he sees infectious disease at [facility] next week the following week on Wednesday I believe he has an appointment with Dr. Andree Elk at [facility] who is his vascular cardiologist. Finally he has an appointment with Dr. Doran Durand on 11/22/16 we have been using silver alginate. The patient's wife states they are having trouble getting this through [facility] 11/13/16; the patient was seen by infectious disease at [facility] in the 11th PICC line placed in preparation for IV antibiotics. A tummy he has not going to get IV vancomycin o Ceftaroline. They've also ordered an MRI. Patient has a follow-up with Dr. Doran Durand on 11/22/16 and Dr. Andree Elk at  [facility] tomorrow 11/23/16 the patient is on daptomycin as directed by infectious disease at [facility]. He is also been back to see Dr. Andree Elk at [facility]. He underwent a repeat arteriogram. He had a successful PTA of the right anterior tibial artery. He is on dual antiplatelete treatment with Plavix and aspirin. Finally he had the MRI of his foot in [place]. This showed cellulitis about the foot worse distally edema and enhancement in the reminiscent of the second metatarsal was consistent with osteomyelitis therefore what I was assuming to be the first metatarsal may be actually the second. He also has a fluid collection deep to the calcaneus at the level of the calcaneal spur which could be an abscess or due to adventitial bursitis. He had a small tear in his Achilles 11/30/16; the patient continues on daptomycin as directed by infectious disease at [facility]. He is been revascularized by Dr. Andree Elk at [place] in [place]. He has been to see Gretta Arab who was the orthopedic surgeon who did his original amputation. I have not seen his not however per the patient's wife he did not offer another surgical local surgical prodecure to remove involved bone. He verbalized his usual disbelief in not just hyperbarics but any medical therapy for this condition(osteomyelitis). I discussed this in detail with the patient today including answering the question about a BKA definitively "curing" the current condition. 12/07/16; the patient continues on daptomycin as directed by  infectious disease at [facility]. This is directed at the MRSA that we cultured from his bone debridement from 12/22. Lab work today shows a white count of 8.7 hemoglobin of 10.5 which is microcytic and hypochromic differential count shows a slightly elevated monocyte count at 1.2 eosinophilic count of 0.6. His creatinine is 1.11 sedimentation rate apparently is gone from 35-34 now 40. I explained was wife I don't think this represents  a trend. His total CK is 48 12/14/16- patient is here for follow-up evaluation of his right TMA site. He continues to receive IV daptomycin per infectious disease. His serum inflammatory markers remain elevated. He complains of intermittent pain to the medial aspect of the TMA site with intermittent erythema. He voices no complaints or concerns regarding hyperbaric therapy. Overall he and his wife are expressing a frustration and discouragement regarrding the length of time of treatment. 12/21/16; small open wound at roughly the first or second metatarsal metatarsalphalyngeal joint reminiscence of his transmetatarsal amputation site he continues to receive IV daptomycin per infectious disease at [facility]. He will finish these a week tomorrow. He has lab work which I been copied on. His white count is 10.8 hemoglobin 8.9 MCV is low at 75., MCH low at 24.5 platelet count slightly elevated at 626. Differential count shows 70% neutrophils 10% monocytes and 10% eosinophils. His comprehensive metabolic panel shows a slightly low sodium at 133 albumin low at 3.1 total CK is normal at 42 sedimentation rate is much higher at 82. He has had iron studies that show a serum iron of 15 and iron binding capacity of 238 and iron saturation of 6. This is suggestive of iron deficiency. B12 and folate were normal ferritin at 113 The patient tells me that he is not eating well and he has lost weight. He feels episodically nauseated. He is coughing and gagging on mucus which she thinks is sinusitis. He has an appointment with his primary doctor at 5:00 this afternoon in [facility] 01/02/17; the patient developed a subacute pneumonitis. He was admitted to [facility] [name] after a CT scan showed an extensive interstitial pneumonitis [I have not yet seen this]. 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 He has since  been discharged. He is not on oxygen. He feels fatigued and very short of breath with exertion. At [facility] the wound care nurse there felt that his wound was healed. He did complete his daptomycin and has follow-up with infectious disease on Friday. X-rays I did before he went to [facility] still suggested residual osteomyelitis in the anterior aspect of the second must metatarsal head. I'm not sure I would've expected any different. There was no other findings. I actually think I did this because of erythema over the first metatarsal head reminiscent 01/11/17; the patient has eosinophilic pneumonitis. He has been reviewed by pulmonology and given clearance for hyperbarics at least that's what his wife says. I'll need to see if there is note in care everywhere. Apparently the prognosis for improvement of daptomycin induced eosinophilic granulocyte is is 3 months without steroids. In the meantime infectious disease has placed him on doxycycline until the wound is closed. He is still is lost a lot of weight and his blood sugars are running in the mid 60s to low 80s fasting and at all times during the day 01/18/17; he has had adjustments in his insulin apparently his blood sugars in the morning or over 100. He  wants to restart his hyperbaric treatment we'll do this at 1:00. 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 I think there is good reason to complete his treatments in order to give him the best chance of maintaining a healed status and these DFU 3 wounds with MRSA infection in the bone 02/15/17; the patient was seen today in conjunction with HBO. He completed hyperbaric oxygen today. The open area on his transmetatarsal site has remained closed. There was an area of erythema when I saw him earlier in the week on the posterior heel although that is resolved as of today as well. He has been using a cam walker. This  is a patient who came to [place] after a transmetatarsal amputation that was necrotic and dehisced. He required revascularization percutaneously on 2 different occasions by Dr. Andree Elk of invasive cardiology at [facility]. 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 He went to [facility] infectious disease and had IV daptomycin for 5 weeks before developing eosinophilic pneumonitis and requiring an admission to hospital/ICU. He made a good recovery and is continued on doxycycline since. As mentioned his foot is closed now. He has a follow-up with Dr. Andree Elk tomorrow. He is going to [employer] on Monday for a custom-made shoe READMISSION Last visit Dr. Andree Elk [facility] 02/16/17 1. Critical limb ischemia of the RLE: s/p transmetatarsal amputation of the RLE, now completely healed. s/p right ATA percutaneous revascularization procedure x2, most recently 11/20/2016 s/p PTA of right AT 100% to less than 20% with a 2.5 x 200 balloon. He does have some residual osteomyelitis, but given the wound is closed, the orthopedist has recommended to follow. He has a fitting for a special shoe coming up next week. He has completed a course of daptomycin and doxycycline and he has been discharged by ID. He has completed a course of hyperbaric therapy. Continue Continue medical management with aspirin, Plavix and statin therapy. We will discuss ongoing Plavix therapy at follow-up in 6 months. 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 Given no symptoms, we will conservatively manage. He doesn't want to do any more invasive studies at this time, which is reasonable. The patient arrives today out of 2 concerns both on the right transmetatarsal site. 1 at the level of the reminiscent fifth metatarsal head and  the other at roughly the first or second. Both of these look like dark subcutaneous discoloration probably subdermal bleeding. He is recently obtained new adaptive footwear for the right foot. He also has a callus on the left fifth dorsal toe however he follows with podiatry for this and I don't think this is any issue. ABIs in this clinic today were 0.66 on the right and 1.06 on the left. On entrance into our clinic initially this was 0.95 and 0.92. He does not describe current claudication. They're going away on a cruise in 8 weeks and I think are trying to do the month is much as they can proactively. They follow with podiatry and have an appointment with Dr. Andree Elk in October READMISSION 06/25/18 This is a patient that we have not seen in almost a year. He is a type II diabetic with known PAD. He is followed by Dr. Andree Elk of interventional cardiology at [facility] in  [place]. He is required revascularization for significant PAD. When we first saw him he required a transmetatarsal amputation. He had underlying osteomyelitis with a nonhealing surgical wound. This eventually closed with wound care, IV antibiotics and hyperbaric oxygen. They tell me that he has a modified shoe and he is very active walking up to 4 miles a day. He is followed by Dr. Geroge Baseman of podiatry. His wife states that he underwent a removal of callus over this site on July 9. She felt there may be drainage from this site after that although she could never really determined and there was callus buildup again. On 06/01/18 there was pressure bleeding through the overlying callus. he was given a prescription for 7 days of Bactrim. Fortuitously he has an appointment with Dr. Andree Elk on 06/04/18 and he immediately underwent revascularization of the right leg although I have not had a chance to review these records in care everywhere. This was apparently done through anterior and retrograde access. On 06/06/18 he had another debridement by  Dr. Bernette Mayers. Vitamin soaking with Epsom salts for 20 minutes and applying calcium alginate. The original trans-met was on April 2017 I believe by Dr. Doran Durand. His ABI in our clinic was noncompressible today. 07/02/18; x-ray I ordered last week was negative for osteomyelitis. Swab culture was also negative. He is going to require an MRI which I have ordered today. 07/09/18; surprisingly the MRI of the foot that I ordered did not show osteomyelitis. He did suggest the possibility of cellulitis. For this reason I'll go ahead and give him a 10 day course of doxycycline. Although the previous culture of this area was negative 07/16/18; it arrives with the wound looking much the same. Roughly the same depth. He has thick subcutaneous tissue around the wound orifice but this still has roughly the same depth. Been using silver alginate. We applied Oasis #1 today 07/23/2018; still having to remove a lot of callus and thick subcutaneous tissue to actually define the wound here. Most of this seems to have closed down yet he has a comma shaped divot over the top of the area that still I think is open. We applied Oasis #2 His wife expressed concern about the tip of his left great toe. This almost looks like a small blister. She also showed it today to her podiatrist Dr. Geroge Baseman who did not think this was anything serious. I am not sure is anything serious either however I think it bears some watching. He is not in any pain however he is insensate 07/30/2018;; still on a lot of nonviable tissue over the surface of the wound however cleaning this up reveals a more substantial wound orifice but less of a probing wound depth. This is not probed to bone. There is no evidence of infection The wife is still concerned about a non-open area on the tip of his left great toe. Almost feels like a bony outgrowth. She had previously showed this to podiatry. I do not think this is a blister. A friction area would be possible  although he is really not walking according to his wife. He had a small skin tag on his right buttock but no open wound here either 08/06/2018; we applied a third Oasis last week. Unfortunately there is really no improvement. Still requiring extensive debridement to expose the wound bed from a horizontal slitlike depression. I still have not been able to get this to fill in properly. On the positive side there is now no probable bone from  when he first came into the facility. I changed him to silver alginate today after a reasonably aggressive debridement 08/13/2018; once again the patient comes in with skin and subcutaneous tissue closing over the small probing area with the underlying cavity of the wound on the right TMA site. I applied silver alginate to this last week. Prior to that we used Oasis x3 still not able to get this area granulating. He does not have a probing area of the bone which is an improvement from when I spur started working on this and an MRI did not suggest osteomyelitis. I do not see evidence of infection here but I am increasingly concerned about why I cannot get this area to granulate. Each time I debrided this is looks like this is simply a matter of getting granulation to fill in the hole and then getting epithelialization. This does not seem to happen Also I sent him back to see podiatry Dr. Earleen Newport about the what felt to be bony outgrowth on the tip of his left great toe. Apparently after heel he left the clinic last week or the next day he developed a blood blister. He did see Dr. Earleen Newport. He went on to have a debridement of the medial nail cuticle he now has an open area here as well as some denuded skin. They did an x-ray apparently does have a bony outgrowth or spur but I am not able to look at this. They are using topical antibiotics apparently there was some suggested he use Santyl. Patient's wife was anxious for my opinion of this 08/20/2018; we are able to keep  the wound open this time instead of the thick subcutaneous tissue closing over the top of it however unfortunately once again this probes to bone. I do not see any evidence of infection and previous MRI did not show osteomyelitis. I elected to go back to the Oasis to see if we can stimulate some granulation. Last saw his vascular interventional cardiologist Dr. Andree Elk at the beginning of August and he had a repeat procedure. Nevertheless I wonder how much blood flow he has down to this area With regards to the left first toe he is seeing Dr. Earleen Newport next week. They are applying Bactroban to this area. 08/27/2018 Once again he comes in with thick eschar and subcutaneous tissue over the top of the small probing hole. This does not appear to go down to bone but it still has roughly the same depth. I reapplied Oasis today Over the left great toe there appears to be more of the wound at the tip of his toe than there was last week. This has an eschar on the surface of it. Will change to Santyl. There is seeing podiatry this afternoon 09/03/2018 He comes in today with the area on the transmetatarsal's site with a fair amount of callus, nonviable tissue over the circumference but it was not closed. I removed all of this as well as some subcutaneous debris and reapplied Oasis. There is no exposed bone The area over the tip of the left great toe started off as a nodule of uncertain etiology. He has been followed with podiatry. They have been removing part of the medial nail bed. He has nonviable tissue over the wound he been using Santyl in this area 09/10/18 Unfortunately comes in with neither wound area looking improved. The transmetatarsal amputation site once Again has nonviable debris over the surface requiring debridement. Unfortunately underneath this there is nothing that looks viable and  this once again goes right down to bone. There is no purulent drainage and no erythema. Also the surgical wound  from podiatry on the left first toe has an ischemic-looking eschar over the surface of the tip of the toe I have elected not to attempt candidly debride this His wife as arranged for him to have follow-up noninvasive studies in [place] under the care of Dr. Andree Elk clinic. The question is he has known severe PAD. They had recently seen him in August. He has had revascularizations in both legs within the last 4 or 5 months. He is not complaining of pain and I cannot really get a history of claudication. He has not systemically unwell 09/20/2018 the patient has been to [facility] and been revascularized by Dr. Andree Elk earlier this week. Apparently he was able to open up the anterior tibial artery although I have not actually seen his formal report. He is going for an attempt to revascularize on the left on Monday. He is apparently working with an investigational stent for lower extremity arteries below the knee and he has talked to the patient about placing that on Monday if possible. The patient has been using silver alginate on the transmetatarsal amputation site and Santyl on the left 09/30/2018; patient had his revascularization on the left this apparently included a standard approach as well as a more distal arterial catheterization although I do not have any information on this from Dr. Andree Elk. In fact I do not even see the initial revascularization that he had on the right. I have included the arterial history from Dr. Andree Elk last note however below; ASSESSMENT/PLAN: 1. Hx of Critical limb ischemia bilateral lower extremities, PAD: -s/p transmetatarsal amputation of the RLE. -s/p right ATA percutaneous revascularization procedure x2, most recently 11/20/2016 s/p PTA of right AT 100% to less than 20% with a 2.5 x 200 balloon. -On original angiogram 05/15/2016, he had a significant 70-95% left popliteal artery stenosis with AT and PT artery occlusions and one vessel runoff via the peroneal  artery. -03/21/2018 s/p PTA of 90% left popliteal artery to <10% with a 5x20 cutting balloon, PTA of 90% left peroneal to <20% with a 3x20 balloon, PTA of 100% left AT to <20% with a 2.5x220 balloon. 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 Will plan on the LLE soon after. Risks/benefits of procedure have been discussed and patient has elected to proceed. We have been using endoform to the right TMA amputation site wound and Santyl to the left great toe 10/07/2018; the area on the tip of his left great toe looked better we have been using Santyl here. We continue to have a very difficult probing hole on the right TMA amputation site we have been using endoform. I went on to use his fifth Oasis today 10/14/2018; the tip of the left great toe continues to look better. We have been using Santyl here the surface however is healthy and I think we can change to an alginate. The right TMA has not changed. Once again he has no superficial opening there is callus and thick subcutaneous tissue over the orifice once you remove this there is the probing area that we have been dealing with without too much change. There is no palpable bone I have been placing Oasis here and put Oasis #6 in this today after a more vigorous  debridement 10/21/18; the left great toe still has necrotic surface requiring debridement. The right TMA site hasn't changed in view of the thick callus over the wound bed. With removal of this there is still the opening however this does not appear to have the same depth. Again there is no palpable bone. Oasis was replaced 10/28/2018; patient comes in with both wounds looking worse. The area over the first toe tip is now down to bone. The area over the TMA site is deeper and down to bone clearly with a increase in overall wound area. Equally  concerning on the right TMA is the complete absence of a pulse this week which is a change. We are not even able to Doppler this. On the right he has a noncompressible ABI greater than 1.4 11/04/2018. Both wounds look somewhat worse. X-rays showed no osteomyelitis of the right foot but on the left there was underlying osteomyelitis in the left great toe distal phalanx. I been on the phone to Dr. Andree Elk surface at [facility] in [place] and they are arranging for another angiogram on the right on Monday. I have him on doxycycline for the osteomyelitis in the left great toe for now. Infectious disease may be necessary 1/17; 2-week hiatus. Patient was admitted to hospital at [place]. My understanding is he underwent an angioplasty of the right anterior tibial artery and had stents placed in the left anterior artery and the left tibial peroneal trunk. This was done by Dr. Andree Elk of interventional radiology. There is no major change in either 1 of the wounds. They have been using Aquacel Ag. As far as they are aware no imaging studies were done of the foot which is indeed unfortunate. I had him on doxycycline for 2 weeks since we identified the osteomyelitis in the left great toe by plain x-ray. I have renewed that again today. I am still suspicious about osteomyelitis in the amputation site and would consider doing another MRI to compare with the one done in September. The idea of hyperbaric oxygen certainly comes up for discussion 1/24; no major change in either wound area. I have him on doxycycline for osteomyelitis at the tip of the left great toe. As noted he has been previously and recently revascularized by Dr. Andree Elk at [place]. He is tolerating the doxycycline well. For some reason we do not have an infectious disease consult yet. Culture of drainage from the right foot site last week was negative 1/31; MRI of the right foot did not show osteomyelitis of the right ankle and foot. Notable for a skin  ulceration overlying the second metatarsal stump with generalizing soft tissue edema of the ankle and foot consistent with cellulitis. Noted to have a partial-thickness tear of the Achilles tendon 7.5 cm proximal to the insertion Nothing really new in terms of symptoms. Patient's area on the tip of the left great toe is just about closed although he still has the probing area on the metatarsal amputation site. Appointment with Dr. Linus Salmons of infectious disease next week 2/7; Dr. Novella Olive did not feel that any further antibiotics were necessary he would follow-up in 2 months. The left great toe appears to be closed still some surface callus that I gently looked under high did not see anything open or anything that was threatening to be open. He still has the open area on the mid part of his TMA site using endoform 2/14; left great toe is closed and he is completing his doxycycline as of last Sunday.  He is not on any antibiotics. Unfortunately out of the right foot his wife noticed some subdermal hemorrhage this week. He had been walking 2 miles I had given him permission to do so this is not really a plantar wound. Using endoform to this wound but I changed to silver alginate this week 2/21; left great toe remains closed. Culture last week grew Streptococcus angiosis which I am not really familiar with however it is penicillin sensitive and I am going to put him on Augmentin. Not much change in the wound on the right foot the deep area is still probing precariously close to bone and the wound on the margin of the TMA is larger. 2/28; left great toe remains closed. He is completing the Augmentin I gave him last week. 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 This is being shown to Dr. Andree Elk at [place] to see if there is anything else that can be done here. He has been using silver alginate strips on the right 3/6; left great toe remains closed. He sees Dr. Andree Elk on  Monday. The area on the plantar aspect of the right foot has the same small orifice with thick callused tissue around this. However this time with removal of the callus tissue the wound is open all the way along the incision line to the end medially. We have been using silver alginate. 3/13; left toe remains closed although the area is callused. He sees Dr. Jacqualyn Posey of podiatry next week. The area on the plantar right foot looked a lot better this week. Culture I did of this was negative we use silver alginate. He is going next week for an attempt at revascularization by Dr. Andree Elk 3/23; left toe remains closed although the area is callused. He will see Dr. Jacqualyn Posey in follow-up. The area on the right plantar foot continues to look surprisingly better over the last 3 visits. We have been using silver alginate. The revascularization he was supposed to have by Dr. Andree Elk at [facility] in [place] has been canceled [facility] procedure] 4/6; the right foot remains closed albeit callused. Podiatry canceled the appointment with regards to the left great toe. He has not seen Dr. Andree Elk at [facility] but thinks that Dr. Andree Elk has "done all he can do". He would be a candidate for the hemostaemix trial Readmission 07/29/2019 Mr. Stann Mainland is a man we know well from at least 3 previous stays in this clinic. He is a type II diabetic with severe PAD followed by Dr. Andree Elk at [facility] in [place]. 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 So far everything in both of these areas has remained closed. About 2 weeks ago he went to [facility] pod[facility] Babs Bertin. He had a thick callus on the left fifth metatarsal head that was shaved. He has developed an open wound in this area. They have been offloading this in his diabetic shoes. The patient has not had any more  revascularizations by Dr. Andree Elk since the last time he was here. ABI in our clinic at the posterior tibial on the left was 1.06 10/6; no real change in the area on the plantar met head. Small wound with 2 mm of depth. He has thick skin probably from pressure around the wound. We have been using silver alginate  10/13; small wound in the left fifth plantar met head. Arrives today with undermining laterally and purulent drainage. Our intake nurse cultured this. His wife stated they noticed a change in color over the last day or 2. He is not systemically unwell 10/19; small wound on the fifth plantar metatarsal head. Culture I did last week showed Staphylococcus lugdunensis. Although this could be a skin contaminant the possibility of a skin and soft tissue infection was there. I did give him empiric doxycycline which should have covered this. We are using silver alginate to the wound. We put him in a total contact cast today. 10/22; small wound on the fifth plantar metatarsal head. He has completed antibiotics. He also has severe PAD which worries me about just about any wound on this man's foot 10/29; small superficial area on the fifth plantar metatarsal head. Measuring slightly smaller. He is not currently on any antibiotics. I been using a total contact cast in this man with very severe PAD but he seems to be tolerating this well 11/5; left plantar fifth metatarsal head. Silver alginate being used under a total contact cast 11/12; wound not much different than last week. Using a #15 scalpel debridement around the wound. Still silver collagen under a total contact cast. He has severe PAD and that may be playing a role in this 11/19; disappointing that the wound is not really changed that much. I think it is come down in overall surface area because originally this was on the lateral part of the fifth metatarsal head however recently it is not really changed. Most of this is filled in but it will  not epithelialized there is surface debris on this which may be mostly related to ischemia. They have an appointment with Dr. Andree Elk on 12/9 Electronic Signature(s) Signed: 09/18/2019 6:25:25 PM By: [name] MD Entered By: [name] on 09/18/2019 13:58:19 -------------------------------------------------------------------------------- Physical Exam Details Patient Name: Date of Service: [name], [name] 09/18/2019 12:30 PM Medical Record [id number] Patient Account Number: [id number] Date of Birth/Sex: [birth year] (73 y.o. M) Treating RN: Deon Pilling Primary Care Provider: [name] Other Clinician: Referring Provider: Treating Provider/Extender:Dalexa Gentz, [name], [name] in Treatment: 7 Respiratory work of breathing is normal. Cardiovascular His dorsalis pedis pulses still faintly palpable. Integumentary (Hair, Skin) No erythema around the wound. Psychiatric appears at normal baseline. Notes Wound exam; may be slightly smaller overall than originally but certainly not much change in the last few weeks with a cast on. Fibrinous debris over the wound debrided with a #3 curette there is not a lot of bleeding here. There is no evidence of infection Electronic Signature(s) Signed: 09/18/2019 6:25:25 PM By: [name] MD Entered By: [name] on 09/18/2019 13:59:06 -------------------------------------------------------------------------------- Physician Orders Details Patient Name: Date of Service: [name], [name] 09/18/2019 12:30 PM Medical Record [id number] Patient Account Number: [id number] Date of Birth/Sex: [birth year] (73 y.o. M) Treating RN: Deon Pilling Primary Care Provider: [name] Other Clinician: Referring Provider: Treating Provider/Extender:Delona Clasby, [name], [name] in Treatment: 7 Verbal / Phone Orders: No Diagnosis Coding ICD-10 Coding Code Description E11.621 Type 2 diabetes mellitus with foot  ulcer E11.51 Type 2 diabetes mellitus with diabetic peripheral angiopathy without gangrene L97.521 Non-pressure chronic ulcer of other part of left foot limited to breakdown of skin L03.116 Cellulitis of left lower limb Follow-up Appointments Return Appointment in 1 week. Dressing Change Frequency Wound #5 Left Metatarsal head fifth Do not change entire dressing for one week. Wound Cleansing Wound #5 Left Metatarsal head fifth May shower  with protection. Primary Wound Dressing Wound #5 Left Metatarsal head fifth Hydrofera Blue Secondary Dressing Wound #5 Left Metatarsal head fifth Foam - foam donut pad foot. Dry Gauze Drawtex Off-Loading Total Contact Cast to Left Lower Extremity Electronic Signature(s) Signed: 09/18/2019 6:25:25 PM By: [name] MD Signed: 09/18/2019 6:38:14 PM By: Deon Pilling Entered By: Deon Pilling on 09/18/2019 13:36:37 -------------------------------------------------------------------------------- Problem List Details Patient Name: Date of Service: [name], [name] 09/18/2019 12:30 PM Medical Record [id number] Patient Account Number: [id number] Date of Birth/Sex: Nov 30, 1945 (73 y.o. M) Treating RN: Deon Pilling Primary Care Provider: [name] Other Clinician: Referring Provider: Treating Provider/Extender:Adilynn Bessey, [name], [name] in Treatment: 7 Active Problems ICD-10 Evaluated Encounter Code Description Active Date Today Diagnosis E11.621 Type 2 diabetes mellitus with foot ulcer 07/29/2019 No Yes E11.51 Type 2 diabetes mellitus with diabetic peripheral 07/29/2019 No Yes angiopathy without gangrene L97.521 Non-pressure chronic ulcer of other part of left foot 07/29/2019 No Yes limited to breakdown of skin L03.116 Cellulitis of left lower limb 08/12/2019 No Yes Inactive Problems Resolved Problems Electronic Signature(s) Signed: 09/18/2019 6:25:25 PM By: [name] MD Entered By: [name] on 09/18/2019  13:56:33 -------------------------------------------------------------------------------- Progress Note Details Patient Name: Date of Service: [name], [name] 09/18/2019 12:30 PM Medical Record [id number] Patient Account Number: [id number] Date of Birth/Sex: 1946-03-11 (73 y.o. M) Treating RN: Deon Pilling Primary Care Provider: [name] Other Clinician: Referring Provider: Treating Provider/Extender:Ephram Kornegay, [name], [name] in Treatment: 7 Subjective History of Present Illness (HPI) 05/18/16; this is a 73year-old diabetic who is a type II diabetic on insulin. The history is that he traumatized his right foot developed a sore sometime in late March. Shortly thereafter he went on a cruise but he had to get off the cruise ship in [place] and fly urgently back to [place] where he was admitted to [facility] and ultimately underwent a transmetatarsal amputation by Dr. 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 According to the patient and his wife this wound never really healed. He was seen on 2 occasions in the wound care center in [place] and had vascular studies and then was referred urgently to Dr. Bridgett Larsson of vascular surgery. He underwent an angiogram on 05/10/16. Unfortunately nothing really could be done to improve his vascular status. He had a 75-90% stenosis in the midsegment of 1 segment of the posterior femoral artery. He had a patent popliteal, his anterior tibial occluded shortly after takeoff. Perineal had a greater than 90% stenosis posterior tibial is occluded feet had no distal collaterals feed distal aspect of the transmetatarsal amputation site. The patient tells me that he had a prolonged period of Santyl by Dr. Doran Durand was some initial improvement but then this was stopped. I think they're only applying daily dressings/dry dressings. He has not had a recent x-ray of the right foot he did have one before his surgery in April. His wife by  the dimensions of the wound/surgical site being followed at home since 4/20. At that point the dimensions were 0.5 x 12 x 0.2 on 7/19 this was 1.8 x 6 x 0.4. He is not currently on any antibiotics. His hemoglobin A1c in early April was 12.9 at that point he was started on insulin. Apparently his blood sugars are much lower he has an appointment with Dr. Legrand Como Alteimer of endocrine next week. 05/29/16 x-ray of the area did not show osteomyelitis. I think he probably needs an MRI at this point. His wife is asking about something called"Yireh" cream which is not FDA  approved. I have not heard of this. 06/22/16; MRI did not really suggest osteomyelitis. There was minimal marrow edema and enhancement in the stump of the second metatarsal felt to be secondary likely to postoperative change rather than osteomyelitis. The patient has arranged his own consultation with Dr. Andree Elk at [place], apparently their daughter lives in [place] and has some connection here. Any improvement in vascular supply by Dr. Andree Elk would of course be helpful. Dr. Bridgett Larsson did not feel that anything further could be done other than amputation if wound care did not result in healing or if the area deteriorates. 06/26/16; the patient has been to see Dr. Andree Elk at [place] and had an angiogram. He is going for a procedure on Thursday which will involve catheterization. I'm not sure if this is an anterograde or retrograde approach. He has been using Santyl to the wound 07/10/16; the patient had a repeat angiogram and angioplasty at [place] by Dr. Brunetta Jeans. His angiogram showed right CFA and profundal widely patent. The right as of a.m. popliteal artery were widely patent the right anterior tibial was occluded proximally and reconstitutes at the ankle. Peroneal artery was patent to the foot. Posterior tibial artery was occluded. The patient had angioplasty of the anterior tibial artery.. This was quite successful. He was recommended for Plavix as  well as aspirin. 07/17/16; the patient was close to be a nurse visit today however the outer dressing of the Apligraf fell off. Noted drainage. I was asked to see the wound. The patient is noted an odor however his wife had noted that. Drainage with Apligraf not necessarily a bad thing. He has not been systemically unwell 07/24/16; we are still have an issue with drainage of this wound. In spite of this I applied his second Apligraf. Medially the area still is probing to bone. 08/07/16; Apligraf reapplied in general wound looks improved. 08/21/16 Apligraf #4. Wound looks much better 09/04/16 patientt's wound again today continues to appear to improve with the application of the Apligraf's. He notes no increased discomfort or concerns at this point in time. 09/18/16; the patient returns today 2 weeks after his fifth application of Apligraf. Predictably three quarters of the width of this wound has healed. The deep area that probe to bone medially is still open. The patient asked how much out-of-pocket dollars would be for additional Apligraf's. 09/25/16; now using Hydrofera Blue. He has completed 5 Apligraf applications with considerable improvement in this deep open transmetatarsal amputation site. His wound is now a triangular-shaped wound on the medial aspect. At roughly 12 to 2:00 this probes another centimeter but as opposed to in the past this does not probe to bone. The patient has been seen at [facility] by Dr. Zenia Resides. He is not planning to do any more revascularization unless the wound stalls or worsens per the patient 10/02/16; 0.7 x 0.8 x 0.8. Unfortunately although the wound looks stable to improved. There is now easily probable bone. This hasn't been present for several weeks. Patient is not otherwise symptomatic he is not experiencing any pain. I did a culture of the wound bed 10/09/16. Deterioration last week. Culture grew MRSA and although there is improvement here with doxycycline prescribed  over the phone I'm going to try to get him linezolid 600 twice a day for 10 days today. 10/16/16; he is completing a weeks worth of linezolid and still has 3 more days to go. Small triangular-shaped open area with some degree of undermining. There is still palpable bone with a  curet. Overall the area appears better than last week 10/20/16 he has completed the linezolid still having some nausea and vomiting but no diarrhea. He has exposed bone this week which is a deterioration. 10/27/16 patient now has a small but probing wound down to bone. Culture of this bone that I did last week showed a few methicillin-resistant staph aureus. I have little doubt that this represents acute/subacute osteomyelitis. The patient is currently on Doxy which I will continue he also completed 10 days of linezolid. We are now in a difficult situation with this patient's foot after considerable discussion we will send him back to see Dr. Doran Durand for a surgical opinion of this I'm also going to try to arrange a infectious disease consult at [facility] hopefully week and get this prior to her usual 4-6 weeks we having [place]. The patient clearly is going to need 6 weeks of IV vancomycin. If we cannot arrange this expediently I'll have to consider ordering this myself through a home infusion company 11/03/16; the patient now has a small in terms of circumference but probing wound. No bone palpable today. The patient remains on doxycycline 100 twice a day which should support him until he sees infectious disease at [facility] next week the following week on Wednesday I believe he has an appointment with Dr. Andree Elk at [facility] who is his vascular cardiologist. Finally he has an appointment with Dr. Doran Durand on 11/22/16 we have been using silver alginate. The patient's wife states they are having trouble getting this through [facility] 11/13/16; the patient was seen by infectious disease at [facility] in the 11th PICC line placed in  preparation for IV antibiotics. A tummy he has not going to get IV vancomycin o Ceftaroline. They've also ordered an MRI. Patient has a follow-up with Dr. Doran Durand on 11/22/16 and Dr. Andree Elk at [facility] tomorrow 11/23/16 the patient is on daptomycin as directed by infectious disease at [facility]. He is also been back to see Dr. Andree Elk at [facility]. He underwent a repeat arteriogram. He had a successful PTA of the right anterior tibial artery. He is on dual antiplatelete treatment with Plavix and aspirin. Finally he had the MRI of his foot in [place]. This showed cellulitis about the foot worse distally edema and enhancement in the reminiscent of the second metatarsal was consistent with osteomyelitis therefore what I was assuming to be the first metatarsal may be actually the second. He also has a fluid collection deep to the calcaneus at the level of the calcaneal spur which could be an abscess or due to adventitial bursitis. He had a small tear in his Achilles 11/30/16; the patient continues on daptomycin as directed by infectious disease at [facility]. He is been revascularized by Dr. Andree Elk at [facility] in [place]. He has been to see Gretta Arab who was the orthopedic surgeon who did his original amputation. I have not seen his not however per the patient's wife he did not offer another surgical local surgical prodecure to remove involved bone. He verbalized his usual disbelief in not just hyperbarics but any medical therapy for this condition(osteomyelitis). I discussed this in detail with the patient today including answering the question about a BKA definitively "curing" the current condition. 12/07/16; the patient continues on daptomycin as directed by infectious disease at [facility]. This is directed at the MRSA that we cultured from his bone debridement from 12/22. Lab work today shows a white count of 8.7 hemoglobin of 10.5 which is microcytic and hypochromic differential  count shows a  slightly elevated monocyte count at 1.2 eosinophilic count of 0.6. His creatinine is 1.11 sedimentation rate apparently is gone from 35-34 now 40. I explained was wife I don't think this represents a trend. His total CK is 48 12/14/16- patient is here for follow-up evaluation of his right TMA site. He continues to receive IV daptomycin per infectious disease. His serum inflammatory markers remain elevated. He complains of intermittent pain to the medial aspect of the TMA site with intermittent erythema. He voices no complaints or concerns regarding hyperbaric therapy. Overall he and his wife are expressing a frustration and discouragement regarrding the length of time of treatment. 12/21/16; small open wound at roughly the first or second metatarsal metatarsalphalyngeal joint reminiscence of his transmetatarsal amputation site he continues to receive IV daptomycin per infectious disease at [facility]. He will finish these a week tomorrow. He has lab work which I been copied on. His white count is 10.8 hemoglobin 8.9 MCV is low at 75., MCH low at 24.5 platelet count slightly elevated at 626. Differential count shows 70% neutrophils 10% monocytes and 10% eosinophils. His comprehensive metabolic panel shows a slightly low sodium at 133 albumin low at 3.1 total CK is normal at 42 sedimentation rate is much higher at 82. He has had iron studies that show a serum iron of 15 and iron binding capacity of 238 and iron saturation of 6. This is suggestive of iron deficiency. B12 and folate were normal ferritin at 113 The patient tells me that he is not eating well and he has lost weight. He feels episodically nauseated. He is coughing and gagging on mucus which she thinks is sinusitis. He has an appointment with his primary doctor at 5:00 this afternoon in [facility] 01/02/17; the patient developed a subacute pneumonitis. He was admitted to [facility] after a CT scan showed an extensive interstitial  pneumonitis [I have not yet seen this]. 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 He has since been discharged. He is not on oxygen. He feels fatigued and very short of breath with exertion. At [facility] the wound care nurse there felt that his wound was healed. He did complete his daptomycin and has follow-up with infectious disease on Friday. X-rays I did before he went to [facility] still suggested residual osteomyelitis in the anterior aspect of the second must metatarsal head. I'm not sure I would've expected any different. There was no other findings. I actually think I did this because of erythema over the first metatarsal head reminiscent 01/11/17; the patient has eosinophilic pneumonitis. He has been reviewed by pulmonology and given clearance for hyperbarics at least that's what his wife says. I'll need to see if there is note in care everywhere. Apparently the prognosis for improvement of daptomycin induced eosinophilic granulocyte is is 3 months without steroids. In the meantime infectious disease has placed him on doxycycline until the wound is closed. He is still is lost a lot of weight and his blood sugars are running in the mid 60s to low 80s fasting and at all times during the day 01/18/17; he has had adjustments in his insulin apparently his blood sugars in the morning or over 100. He wants to restart his hyperbaric treatment we'll do this at 1:00. 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. I think there is good reason to complete  his treatments in order to give him the best chance of maintaining a healed status and these DFU 3 wounds with MRSA infection in the bone 02/15/17; the patient was seen today in conjunction with HBO. He completed hyperbaric oxygen today. The open area on his transmetatarsal site has  remained closed. There was an area of erythema when I saw him earlier in the week on the posterior heel although that is resolved as of today as well. He has been using a cam walker. This is a patient who came to [place] after a transmetatarsal amputation that was necrotic and dehisced. He required revascularization percutaneously on 2 different occasions by Dr. Andree Elk of invasive cardiology at [facility]. 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 He went to [facility] infectious disease and had IV daptomycin for 5 weeks before developing eosinophilic pneumonitis and requiring an admission to hospital/ICU. He made a good recovery and is continued on doxycycline since. As mentioned his foot is closed now. He has a follow-up with Dr. Andree Elk tomorrow. He is going to [employer] on Monday for a custom-made shoe READMISSION Last visit Dr. Andree Elk [facility] 02/16/17 1. Critical limb ischemia of the RLE:  s/p transmetatarsal amputation of the RLE, now completely healed.  s/p right ATA percutaneous revascularization procedure x2, most recently 11/20/2016 s/p PTA of right AT 100% to less than 20% with a 2.5 x 200 balloon.  He does have some residual osteomyelitis, but given the wound is closed, the orthopedist has recommended to follow. He has a fitting for a special shoe coming up next week. He has completed a course of daptomycin and doxycycline and he has been discharged by ID. He has completed a course of hyperbaric therapy.  Continue Continue medical management with aspirin, Plavix and statin therapy. We will discuss ongoing Plavix therapy at follow-up in 6 months.  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 Given no symptoms, we will conservatively manage. He doesn't want to do  any more invasive studies at this time, which is reasonable. The patient arrives today out of 2 concerns both on the right transmetatarsal site. 1 at the level of the reminiscent fifth metatarsal head and the other at roughly the first or second. Both of these look like dark subcutaneous discoloration probably subdermal bleeding. He is recently obtained new adaptive footwear for the right foot. He also has a callus on the left fifth dorsal toe however he follows with podiatry for this and I don't think this is any issue. ABIs in this clinic today were 0.66 on the right and 1.06 on the left. On entrance into our clinic initially this was 0.95 and 0.92. He does not describe current claudication. They're going away on a cruise in 8 weeks and I think are trying to do the month is much as they can proactively. They follow with podiatry and have an appointment with Dr. Andree Elk in October READMISSION 06/25/18 This is a patient that we have not seen in almost a year. He is a type II diabetic with known PAD. He is followed by Dr. Andree Elk of interventional cardiology at [facility] in [place]. He is required revascularization for significant PAD. When we first saw him he required a transmetatarsal amputation. He had underlying osteomyelitis with a nonhealing surgical wound. This eventually closed with wound  care, IV antibiotics and hyperbaric oxygen. They tell me that he has a modified shoe and he is very active walking up to 4 miles a day. He is followed by Dr. Geroge Baseman of podiatry. His wife states that he underwent a removal of callus over this site on July 9. She felt there may be drainage from this site after that although she could never really determined and there was callus buildup again. On 06/01/18 there was pressure bleeding through the overlying callus. he was given a prescription for 7 days of Bactrim. Fortuitously he has an appointment with Dr. Andree Elk on 06/04/18 and he immediately underwent  revascularization of the right leg although I have not had a chance to review these records in care everywhere. This was apparently done through anterior and retrograde access. On 06/06/18 he had another debridement by Dr. Bernette Mayers. Vitamin soaking with Epsom salts for 20 minutes and applying calcium alginate. The original trans-met was on April 2017 I believe by Dr. Doran Durand. His ABI in our clinic was noncompressible today. 07/02/18; x-ray I ordered last week was negative for osteomyelitis. Swab culture was also negative. He is going to require an MRI which I have ordered today. 07/09/18; surprisingly the MRI of the foot that I ordered did not show osteomyelitis. He did suggest the possibility of cellulitis. For this reason I'll go ahead and give him a 10 day course of doxycycline. Although the previous culture of this area was negative 07/16/18; it arrives with the wound looking much the same. Roughly the same depth. He has thick subcutaneous tissue around the wound orifice but this still has roughly the same depth. Been using silver alginate. We applied Oasis #1 today 07/23/2018; still having to remove a lot of callus and thick subcutaneous tissue to actually define the wound here. Most of this seems to have closed down yet he has a comma shaped divot over the top of the area that still I think is open. We applied Oasis #2 His wife expressed concern about the tip of his left great toe. This almost looks like a small blister. She also showed it today to her podiatrist Dr. Geroge Baseman who did not think this was anything serious. I am not sure is anything serious either however I think it bears some watching. He is not in any pain however he is insensate 07/30/2018;; still on a lot of nonviable tissue over the surface of the wound however cleaning this up reveals a more substantial wound orifice but less of a probing wound depth. This is not probed to bone. There is no evidence of infection The wife is still  concerned about a non-open area on the tip of his left great toe. Almost feels like a bony outgrowth. She had previously showed this to podiatry. I do not think this is a blister. A friction area would be possible although he is really not walking according to his wife. He had a small skin tag on his right buttock but no open wound here either 08/06/2018; we applied a third Oasis last week. Unfortunately there is really no improvement. Still requiring extensive debridement to expose the wound bed from a horizontal slitlike depression. I still have not been able to get this to fill in properly. On the positive side there is now no probable bone from when he first came into the facility. I changed him to silver alginate today after a reasonably aggressive debridement 08/13/2018; once again the patient comes in with skin and subcutaneous tissue closing  over the small probing area with the underlying cavity of the wound on the right TMA site. I applied silver alginate to this last week. Prior to that we used Oasis x3 still not able to get this area granulating. He does not have a probing area of the bone which is an improvement from when I spur started working on this and an MRI did not suggest osteomyelitis. I do not see evidence of infection here but I am increasingly concerned about why I cannot get this area to granulate. Each time I debrided this is looks like this is simply a matter of getting granulation to fill in the hole and then getting epithelialization. This does not seem to happen Also I sent him back to see podiatry Dr. Earleen Newport about the what felt to be bony outgrowth on the tip of his left great toe. Apparently after heel he left the clinic last week or the next day he developed a blood blister. He did see Dr. Earleen Newport. He went on to have a debridement of the medial nail cuticle he now has an open area here as well as some denuded skin. They did an x-ray apparently does have a bony  outgrowth or spur but I am not able to look at this. They are using topical antibiotics apparently there was some suggested he use Santyl. Patient's wife was anxious for my opinion of this 08/20/2018; we are able to keep the wound open this time instead of the thick subcutaneous tissue closing over the top of it however unfortunately once again this probes to bone. I do not see any evidence of infection and previous MRI did not show osteomyelitis. I elected to go back to the Oasis to see if we can stimulate some granulation. Last saw his vascular interventional cardiologist Dr. Andree Elk at the beginning of August and he had a repeat procedure. Nevertheless I wonder how much blood flow he has down to this area With regards to the left first toe he is seeing Dr. Earleen Newport next week. They are applying Bactroban to this area. 08/27/2018 ooOnce again he comes in with thick eschar and subcutaneous tissue over the top of the small probing hole. This does not appear to go down to bone but it still has roughly the same depth. I reapplied Oasis today ooOver the left great toe there appears to be more of the wound at the tip of his toe than there was last week. This has an eschar on the surface of it. Will change to Santyl. There is seeing podiatry this afternoon 09/03/2018 ooHe comes in today with the area on the transmetatarsal's site with a fair amount of callus, nonviable tissue over the circumference but it was not closed. I removed all of this as well as some subcutaneous debris and reapplied Oasis. There is no exposed bone ooThe area over the tip of the left great toe started off as a nodule of uncertain etiology. He has been followed with podiatry. They have been removing part of the medial nail bed. He has nonviable tissue over the wound he been using Santyl in this area 09/10/18 ooUnfortunately comes in with neither wound area looking improved. The transmetatarsal amputation site once Again has  nonviable debris over the surface requiring debridement. Unfortunately underneath this there is nothing that looks viable and this once again goes right down to bone. There is no purulent drainage and no erythema. ooAlso the surgical wound from podiatry on the left first toe has an ischemic-looking eschar  over the surface of the tip of the toe I have elected not to attempt candidly debride this [facility] wife as arranged for him to have follow-up noninvasive studies in [place] under the care of Dr. Andree Elk clinic. The question is he has known severe PAD. They had recently seen him in August. He has had revascularizations in both legs within the last 4 or 5 months. He is not complaining of pain and I cannot really get a history of claudication. He has not systemically unwell 09/20/2018 the patient has been to [facility] and been revascularized by Dr. Andree Elk earlier this week. Apparently he was able to open up the anterior tibial artery although I have not actually seen his formal report. He is going for an attempt to revascularize on the left on Monday. He is apparently working with an investigational stent for lower extremity arteries below the knee and he has talked to the patient about placing that on Monday if possible. The patient has been using silver alginate on the transmetatarsal amputation site and Santyl on the left 09/30/2018; patient had his revascularization on the left this apparently included a standard approach as well as a more distal arterial catheterization although I do not have any information on this from Dr. Andree Elk. In fact I do not even see the initial revascularization that he had on the right. I have included the arterial history from Dr. Andree Elk last note however below; ASSESSMENT/PLAN: 1. Hx of Critical limb ischemia bilateral lower extremities, PAD: -s/p transmetatarsal amputation of the RLE. -s/p right ATA percutaneous revascularization procedure x2, most recently  11/20/2016 s/p PTA of right AT 100% to less than 20% with a 2.5 x 200 balloon. 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 -03/21/2018 s/p PTA of 90% left popliteal artery to <10% with a 5x20 cutting balloon, PTA of 90% left peroneal to <20% with a 3x20 balloon, PTA of 100% left AT to <20% with a 2.5x220 balloon. 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 Will plan on the LLE soon after. Risks/benefits of procedure have been discussed and patient has elected to proceed. We have been using endoform to the right TMA amputation site wound and Santyl to the left great toe 10/07/2018; the area on the tip of his left great toe looked better we have been using Santyl here. We continue to have a very difficult probing hole on the right TMA amputation site we have been using endoform. I went on to use his fifth Oasis today 10/14/2018; the tip of the left great toe continues to look better. We have been using Santyl here the surface however is healthy and I think we can change to an alginate. The right TMA has not changed. Once again he has no superficial opening there is callus and thick subcutaneous tissue over the orifice once you remove this there is the probing area that we have been dealing with without too much change. There is no palpable bone I have been placing Oasis here and put Oasis #6 in this today after a more vigorous debridement 10/21/18; the left great toe still has necrotic surface requiring debridement. The right TMA site hasn't changed in view of the thick callus over the wound bed. With removal of this  there is still the opening however this does not appear to have the same depth. Again there is no palpable bone.  Oasis was replaced 10/28/2018; patient comes in with both wounds looking worse. The area over the first toe tip is now down to bone. The area over the TMA site is deeper and down to bone clearly with a increase in overall wound area. Equally concerning on the right TMA is the complete absence of a pulse this week which is a change. We are not even able to Doppler this. On the right he has a noncompressible ABI greater than 1.4 11/04/2018. Both wounds look somewhat worse. X-rays showed no osteomyelitis of the right foot but on the left there was underlying osteomyelitis in the left great toe distal phalanx. I been on the phone to Dr. Andree Elk surface at [facility] in [place] and they are arranging for another angiogram on the right on Monday. I have him on doxycycline for the osteomyelitis in the left great toe for now. Infectious disease may be necessary 1/17; 2-week hiatus. Patient was admitted to hospital at [place]. My understanding is he underwent an angioplasty of the right anterior tibial artery and had stents placed in the left anterior artery and the left tibial peroneal trunk. This was done by Dr. Andree Elk of interventional radiology. There is no major change in either 1 of the wounds. They have been using Aquacel Ag. As far as they are aware no imaging studies were done of the foot which is indeed unfortunate. I had him on doxycycline for 2 weeks since we identified the osteomyelitis in the left great toe by plain x-ray. I have renewed that again today. I am still suspicious about osteomyelitis in the amputation site and would consider doing another MRI to compare with the one done in September. The idea of hyperbaric oxygen certainly comes up for discussion 1/24; no major change in either wound area. I have him on doxycycline for osteomyelitis at the tip of the left great toe. As noted he has been previously and recently revascularized by Dr. Andree Elk at [place]. He is tolerating the doxycycline well.  For some reason we do not have an infectious disease consult yet. Culture of drainage from the right foot site last week was negative 1/31; MRI of the right foot did not show osteomyelitis of the right ankle and foot. Notable for a skin ulceration overlying the second metatarsal stump with generalizing soft tissue edema of the ankle and foot consistent with cellulitis. Noted to have a partial-thickness tear of the Achilles tendon 7.5 cm proximal to the insertion Nothing really new in terms of symptoms. Patient's area on the tip of the left great toe is just about closed although he still has the probing area on the metatarsal amputation site. Appointment with Dr. Linus Salmons of infectious disease next week 2/7; Dr. Novella Olive did not feel that any further antibiotics were necessary he would follow-up in 2 months. The left great toe appears to be closed still some surface callus that I gently looked under high did not see anything open or anything that was threatening to be open. He still has the open area on the mid part of his TMA site using endoform 2/14; left great toe is closed and he is completing his doxycycline as of last Sunday. He is not on any antibiotics. Unfortunately out of the right foot his wife noticed some subdermal hemorrhage this week. He had been walking 2 miles I had given him permission to  do so this is not really a plantar wound. Using endoform to this wound but I changed to silver alginate this week 2/21; left great toe remains closed. Culture last week grew Streptococcus angiosis which I am not really familiar with however it is penicillin sensitive and I am going to put him on Augmentin. Not much change in the wound on the right foot the deep area is still probing precariously close to bone and the wound on the margin of the TMA is larger. 2/28; left great toe remains closed. He is completing the Augmentin I gave him last week. Apparently the anterior tibial artery on the right is  totally reoccluded again. This is being shown to Dr. Andree Elk at [place] to see if there is anything else that can be done here. He has been using silver alginate strips on the right 3/6; left great toe remains closed. He sees Dr. Andree Elk on Monday. The area on the plantar aspect of the right foot has the same small orifice with thick callused tissue around this. However this time with removal of the callus tissue the wound is open all the way along the incision line to the end medially. We have been using silver alginate. 3/13; left toe remains closed although the area is callused. He sees Dr. Jacqualyn Posey of podiatry next week. The area on the plantar right foot looked a lot better this week. Culture I did of this was negative we use silver alginate. He is going next week for an attempt at revascularization by Dr. Andree Elk 3/23; left toe remains closed although the area is callused. He will see Dr. Jacqualyn Posey in follow-up. The area on the right plantar foot continues to look surprisingly better over the last 3 visits. We have been using silver alginate. The revascularization he was supposed to have by Dr. Andree Elk at [facility] in [place] has been canceled [facility] procedure] 4/6; the right foot remains closed albeit callused. Podiatry canceled the appointment with regards to the left great toe. He has not seen Dr. Andree Elk at [facility] but thinks that Dr. Andree Elk has "done all he can do". He would be a candidate for the hemostaemix trial Readmission 07/29/2019 Mr. Stann Mainland is a man we know well from at least 3 previous stays in this clinic. He is a type II diabetic with severe PAD followed by Dr. Andree Elk at [facility] in [place]. 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 So far everything in both of these areas has remained closed. About 2 weeks ago he went to see his podiatrist [facility] Dr. Babs Bertin. He had a thick callus on the left fifth metatarsal head that was shaved. He has developed an open wound in this area. They have been offloading this in his diabetic shoes. The patient has not had any more revascularizations by Dr. Andree Elk since the last time he was here. ABI in our clinic at the posterior tibial on the left was 1.06 10/6; no real change in the area on the plantar met head. Small wound with 2 mm of depth. He has thick skin probably from pressure around the wound. We have been using silver alginate 10/13; small wound in the left fifth plantar met head. Arrives today with undermining laterally and purulent drainage. Our intake nurse cultured this. His wife stated they noticed a change in color  over the last day or 2. He is not systemically unwell 10/19; small wound on the fifth plantar metatarsal head. Culture I did last week showed Staphylococcus lugdunensis. Although this could be a skin contaminant the possibility of a skin and soft tissue infection was there. I did give him empiric doxycycline which should have covered this. We are using silver alginate to the wound. We put him in a total contact cast today. 10/22; small wound on the fifth plantar metatarsal head. He has completed antibiotics. He also has severe PAD which worries me about just about any wound on this man's foot 10/29; small superficial area on the fifth plantar metatarsal head. Measuring slightly smaller. He is not currently on any antibiotics. I been using a total contact cast in this man with very severe PAD but he seems to be tolerating this well 11/5; left plantar fifth metatarsal head. Silver alginate being used under a total contact cast 11/12; wound not much different than last week. Using a #15 scalpel debridement around the wound. Still silver collagen under a total contact cast. He has severe PAD and that may be playing a role in this 11/19; disappointing that the wound is not really  changed that much. I think it is come down in overall surface area because originally this was on the lateral part of the fifth metatarsal head however recently it is not really changed. Most of this is filled in but it will not epithelialized there is surface debris on this which may be mostly related to ischemia. They have an appointment with Dr. Andree Elk on 12/9 Objective Constitutional Vitals Time Taken: 1:07 PM, Height: 69 in, Weight: 210 lbs, BMI: 31, Temperature: 97.0 F, Pulse: 81 bpm, Respiratory Rate: 16 breaths/min, Blood Pressure: 110/53 mmHg, Capillary Blood Glucose: 140 mg/dl. General Notes: glucose per pt report Respiratory work of breathing is normal. Cardiovascular His dorsalis pedis pulses still faintly palpable. Psychiatric appears at normal baseline. General Notes: Wound exam; may be slightly smaller overall than originally but certainly not much change in the last few weeks with a cast on. Fibrinous debris over the wound debrided with a #3 curette there is not a lot of bleeding here. There is no evidence of infection Integumentary (Hair, Skin) No erythema around the wound. Wound #5 status is Open. Original cause of wound was Gradually Appeared. The wound is located on the Left Metatarsal head fifth. The wound measures 0.6cm length x 0.6cm width x 0.1cm depth; 0.283cm^2 area and 0.028cm^3 volume. There is Fat Layer (Subcutaneous Tissue) Exposed exposed. There is no tunneling or undermining noted. There is a small amount of serosanguineous drainage noted. The wound margin is thickened. There is large (67-100%) pink granulation within the wound bed. There is a small (1-33%) amount of necrotic tissue within the wound bed including Adherent Slough. Assessment Active Problems ICD-10 Type 2 diabetes mellitus with foot ulcer Type 2 diabetes mellitus with diabetic peripheral angiopathy without gangrene Non-pressure chronic ulcer of other part of left foot limited to breakdown  of skin Cellulitis of left lower limb Procedures Wound #5 Pre-procedure diagnosis of Wound #5 is a Diabetic Wound/Ulcer of the Lower Extremity located on the Left Metatarsal head fifth .Severity of Tissue Pre Debridement is: Fat layer exposed. There was a Excisional Skin/Subcutaneous Tissue Debridement with a total area of 0.36 sq cm performed by Ricard Dillon., MD. With the following instrument(s): Curette to remove Viable and Non-Viable tissue/material. Material removed includes Subcutaneous Tissue, Slough, Skin: Dermis, and Fibrin/Exudate after achieving pain  control using Lidocaine 4% Topical Solution. A time out was conducted at 13:28, prior to the start of the procedure. A Minimum amount of bleeding was controlled with Pressure. The procedure was tolerated well with a pain level of 0 throughout and a pain level of 0 following the procedure. Post Debridement Measurements: 0.6cm length x 0.6cm width x 0.1cm depth; 0.028cm^3 volume. Character of Wound/Ulcer Post Debridement is improved. Severity of Tissue Post Debridement is: Fat layer exposed. Post procedure Diagnosis Wound #5: Same as Pre-Procedure Pre-procedure diagnosis of Wound #5 is a Diabetic Wound/Ulcer of the Lower Extremity located on the Left Metatarsal head fifth . There was a Total Contact Cast Procedure by Ricard Dillon., MD. Post procedure Diagnosis Wound #5: Same as Pre-Procedure Plan Follow-up Appointments: Return Appointment in 1 week. Dressing Change Frequency: Wound #5 Left Metatarsal head fifth: Do not change entire dressing for one week. Wound Cleansing: Wound #5 Left Metatarsal head fifth: May shower with protection. Primary Wound Dressing: Wound #5 Left Metatarsal head fifth: Hydrofera Blue Secondary Dressing: Wound #5 Left Metatarsal head fifth: Foam - foam donut pad foot. Dry Gauze Drawtex Off-Loading: Total Contact Cast to Left Lower Extremity 1. I change the primary dressing to [facility]. 2. I put the cast back on but had some thoughts about taking this off and allowing his wife to change the dressing more frequently 3. The patient has severe PAD and has had multiple small artery angioplasties done by Dr. Andree Elk in [place]. They have an appointment on 12/9. I will not want him to go there without a cast Electronic Signature(s) Signed: 09/18/2019 6:25:25 PM By: [name] MD Entered By: [name] on 09/18/2019 14:00:28 -------------------------------------------------------------------------------- Total Contact Cast Details Patient Name: Date of Service: [name], [name] 09/18/2019 12:30 PM Medical Record [id number] Patient Account Number: [id number] Date of Birth/Sex: 04/12/1946 (73 y.o. M) Treating RN: Deon Pilling Primary Care Provider: [name] Other Clinician: Referring Provider: Treating Provider/Extender:Abigaile Rossie, [name], [name] in Treatment: 7 Total Contact Cast Applied for Wound Assessment: Wound #5 Left Metatarsal head fifth Performed By: Physician Ricard Dillon., MD Post Procedure Diagnosis Same as Pre-procedure Electronic Signature(s) Signed: 09/18/2019 6:25:25 PM By: [name] MD Entered By: [name] on 09/18/2019 13:57:08 -------------------------------------------------------------------------------- SuperBill Details Patient Name: Date of Service: [name], [name] 09/18/2019 Medical Record [id number] Patient Account Number: [id number] Date of Birth/Sex: Treating RN: [birth year] (73 y.o. Lorette Ang, Meta.Reding Primary Care Provider: [name] Other Clinician: Referring Provider: Treating Provider/Extender:Jaquise Faux, [name], [name] in Treatment: 7 Diagnosis Coding ICD-10 Codes Code Description E11.621 Type 2 diabetes mellitus with foot ulcer E11.51 Type 2 diabetes mellitus with diabetic peripheral angiopathy without gangrene L97.521 Non-pressure chronic ulcer of other part of left  foot limited to breakdown of skin L03.116 Cellulitis of left lower limb Facility Procedures CPT4 Code: 42595638 1 Description: 7564 - DEB SUBQ TISSUE 20 SQ CM/< ICD-10 Diagnosis Description E11.621 Type 2 diabetes mellitus with foot ulcer Modifier: Quantity: 1 Physician Procedures CPT4 Code: 3329518 Description: 84166 - WC PHYS SUBQ TISS 20 SQ CM ICD-10 Diagnosis Description E11.621 Type 2 diabetes mellitus with foot ulcer Modifier: Quantity: 1 Electronic Signature(s) Signed: 09/18/2019 6:25:25 PM By: [name] MD Entered By: [name] on 09/18/2019 14:00:40

## 2019-09-22 NOTE — Progress Notes (Addendum)
[name], [name] ([id number]) Visit Report for 09/18/2019 Arrival Information Details Patient Name: Date of Service: [name], [name] 09/18/2019 12:30 PM Medical Record [id number] Patient Account Number: [id number] Date of Birth/Sex: Treating RN: February 22, 1946 (73 y.o. Daphane ShepherdM) Lynch, Emeterio ReeveShatara Primary Care Stedman [place]: Selinda FlavinHoward, Kevin Other Clinician: Referring Irwin Toran: Treating Amit Meloy/Extender:[name], Peter CongoMichael Howard, Wadie LessenKevin Weeks in Treatment: 7 Visit Information History Since Last Visit Added or deleted any medications: No Patient Arrived: Ambulatory Any new allergies or adverse reactions: No Arrival Time: 13:05 Had a fall or experienced change in No Accompanied By: alone activities of daily living that may affect Transfer Assistance: None risk of falls: Patient Identification Verified: Yes Signs or symptoms of abuse/neglect since No Secondary Verification Process Yes last visito Completed: Hospitalized since last visit: No Patient Requires Transmission-Based No Implantable device outside of the clinic No Precautions: excluding Patient Has Alerts: No cellular tissue based products placed in the center since last visit: Has Dressing in Place as Prescribed: Yes Has Footwear/Offloading in Place as Yes Prescribed: Left: Total Contact Cast Pain Present Now: No Electronic Signature(s) Signed: 09/22/2019 2:18:43 PM By: Zandra AbtsLynch, Shatara RN, BSN Entered By: Zandra AbtsLynch, Shatara on 09/18/2019 13:06:41 -------------------------------------------------------------------------------- Lower Extremity Assessment Details Patient Name: Date of Service: [name], [name] 09/18/2019 12:30 PM Medical Record [id number] Patient Account Number: [id number] Date of Birth/Sex: Treating RN: February 22, 1946 [age] y.o. Elizebeth KollerM) Lynch, Shatara Primary Care Suleyma Wafer: Selinda FlavinHoward, Kevin Other Clinician: Referring Jakorian Marengo: Treating Audrie Kuri/Extender:[name], Peter CongoMichael Howard, Wadie LessenKevin Weeks in Treatment: 7 Edema Assessment Assessed:  [Left: No] [Right: No] Edema: [Left: N] [Right: o] Calf Left: Right: Point of Measurement: cm From Medial Instep 38.8 cm cm Ankle Left: Right: Point of Measurement: cm From Medial Instep 26.2 cm cm Vascular Assessment Pulses: Dorsalis Pedis Palpable: [Left:Yes] Electronic Signature(s) Signed: 09/22/2019 2:18:43 PM By: Zandra AbtsLynch, Shatara RN, BSN Entered By: Zandra AbtsLynch, Shatara on 09/18/2019 13:20:05 -------------------------------------------------------------------------------- Multi Wound Chart Details Patient Name: Date of Service: [name], [name] 09/18/2019 12:30 PM Medical Record [id number] Patient Account Number: [id number] Date of Birth/Sex: Treating RN: February 22, 1946 1(73 y.o. Harlon FlorM) Deaton, Millard.LoaBobbi Primary Care Izear Pine: Selinda FlavinHoward, Kevin Other Clinician: Referring Zeffie Bickert: Treating Yoniel Arkwright/Extender:[name], Peter CongoMichael Howard, Wadie LessenKevin Weeks in Treatment: 7 Vital Signs Height(in): 69 Capillary Blood 140 Glucose(mg/dl): Weight(lbs): 742210 Pulse(bpm): 81 Body Mass Index(BMI): 31 Blood Pressure(mmHg): 110/53 Temperature(F): 97.0 Respiratory 16 Rate(breaths/min): Photos: [5:No Photos] [N/A:N/A] Wound Location: [5:Left Metatarsal head fifth N/A] Wounding Event: [5:Gradually Appeared] [N/A:N/A] Primary Etiology: [5:Diabetic Wound/Ulcer of the N/A Lower Extremity] Comorbid History: [5:Cataracts, Chronic sinus N/A problems/congestion, Coronary Artery Disease, Hypertension, Peripheral Arterial Disease, Type II Diabetes, Osteoarthritis, Neuropathy] Date Acquired: [5:07/14/2019] [N/A:N/A] Weeks of Treatment: [5:7] [N/A:N/A] Wound Status: [5:Open] [N/A:N/A] Measurements L x W x D 0.6x0.6x0.1 [N/A:N/A] (cm) Area (cm) : [5:0.283] [N/A:N/A] Volume (cm) : [5:0.028] [N/A:N/A] % Reduction in Area: [5:-349.20%] [N/A:N/A] % Reduction in Volume: -115.40% [N/A:N/A] Classification: [5:Grade 1] [N/A:N/A] Exudate Amount: [5:Small] [N/A:N/A] Exudate Type: [5:Serosanguineous] [N/A:N/A] Exudate Color:  [5:red, brown] [N/A:N/A] Wound Margin: [5:Thickened] [N/A:N/A] Granulation Amount: [5:Large (67-100%)] [N/A:N/A] Granulation Quality: [5:Pink] [N/A:N/A] Necrotic Amount: [5:Small (1-33%)] [N/A:N/A] Exposed Structures: [5:Fat Layer (Subcutaneous Tissue) Exposed: Yes Fascia: No Tendon: No Muscle: No Joint: No Bone: No] [N/A:N/A] Epithelialization: [5:Medium (34-66%)] [N/A:N/A] Debridement: [5:Debridement - Excisional] [N/A:N/A] Pre-procedure [5:13:28] [N/A:N/A] Verification/Time Out Taken: Pain Control: [5:Lidocaine 4% Topical Solution] [N/A:N/A] Tissue Debrided: [5:Subcutaneous, Slough] [N/A:N/A] Level: [5:Skin/Subcutaneous Tissue] [N/A:N/A] Debridement Area (sq cm):0.36 [N/A:N/A] Instrument: [5:Curette] [N/A:N/A] Bleeding: [5:Minimum] [N/A:N/A] Hemostasis Achieved: [5:Pressure] [N/A:N/A] Procedural Pain: [5:0] [N/A:N/A] Post Procedural Pain: [5:0] [N/A:N/A] Debridement Treatment Procedure was tolerated [N/A:N/A] Response: [5:well] Post Debridement [5:0.6x0.6x0.1] [N/A:N/A] Measurements L x W  x D (cm) Post Debridement [5:0.028] [N/A:N/A] Volume: (cm) Procedures Performed: Debridement [5:Total Contact Cast] [N/A:N/A] Treatment Notes Electronic Signature(s) Signed: 09/18/2019 6:25:25 PM By: Baltazar Najjar MD Signed: 09/18/2019 6:38:14 PM By: Shawn Stall Entered By: Baltazar Najjar on 09/18/2019 13:56:44 -------------------------------------------------------------------------------- Multi-Disciplinary Care Plan Details Patient Name: Date of Service: [name], [name] 09/18/2019 12:30 PM Medical Record [id number] Patient Account Number: [id number] Date of Birth/Sex: Treating RN: [birth year] (73 y.o. [name] Primary Care Ural Acree: [name] Other Clinician: Referring Ayden Apodaca: Treating Sherrill Buikema/Extender:[name], [name], [name] in Treatment: 7 Active Inactive Wound/Skin Impairment Nursing Diagnoses: Knowledge deficit related to  ulceration/compromised skin integrity Goals: Patient/caregiver will verbalize understanding of skin care regimen Date Initiated: 07/29/2019 Target Resolution Date: 10/02/2019 Goal Status: Active Ulcer/skin breakdown will have a volume reduction of 30% by week 4 Date Initiated: 07/29/2019 Target Resolution Date: 10/10/2019 Goal Status: Active Interventions: Assess patient/caregiver ability to obtain necessary supplies Assess patient/caregiver ability to perform ulcer/skin care regimen upon admission and as needed Assess ulceration(s) every visit Notes: Electronic Signature(s) Signed: 09/18/2019 6:38:14 PM By: Shawn Stall Entered By: Shawn Stall on 09/18/2019 13:11:56 -------------------------------------------------------------------------------- Pain Assessment Details Patient Name: Date of Service: [name], [name] 09/18/2019 12:30 PM Medical Record [id number] Patient Account Number: [id number] Date of Birth/Sex: Treating RN: 1946-06-28 (73 y.o. [name] Primary Care Aneth Schlagel: [name] Other Clinician: Referring Marguita Venning: Treating Breyana Follansbee/Extender:[name], [name], [name] in Treatment: 7 Active Problems Location of Pain Severity and Description of Pain Patient Has Paino No Site Locations Pain Management and Medication Current Pain Management: Electronic Signature(s) Signed: 09/22/2019 2:18:43 PM By: Zandra Abts RN, BSN Entered By: Zandra Abts on 09/18/2019 13:06:57 -------------------------------------------------------------------------------- Patient/Caregiver Education Details Patient Name: Todd Loots 11/19/2020andnbsp12:30 Date of Service: PM Medical Record [id number] Number: Patient Account Number: [id number] Treating RN: Date of Birth/Gender: 07/30/1946 (73 y.o. [name]) Other Clinician: Primary Care Physician:Howard, Rob Hickman Referring Physician: Physician/Extender: Joaquin Bend  in Treatment: 7 Education Assessment Education Provided To: Patient Education Topics Provided Wound/Skin Impairment: Handouts: Skin Care Do's and Dont's Methods: Explain/Verbal Responses: Reinforcements needed Electronic Signature(s) Signed: 09/18/2019 6:38:14 PM By: Shawn Stall Entered By: Shawn Stall on 09/18/2019 13:12:06 -------------------------------------------------------------------------------- Wound Assessment Details Patient Name: Date of Service: [name], [name] 09/18/2019 12:30 PM Medical Record [id number] Patient Account Number: [id number] Date of Birth/Sex: Treating RN: 1946/09/20 (73 y.o. [name] Primary Care Tally Mckinnon: [name] Other Clinician: Referring Kely Dohn: Treating Tiffnay Bossi/Extender:[name], [name], [name] in Treatment: 7 Wound Status Wound Number: 5 Primary Diabetic Wound/Ulcer of the Lower Extremity Etiology: Wound Location: Left Metatarsal head fifth Wound Open Wounding Event: Gradually Appeared Status: Date Acquired: 07/14/2019 Comorbid Cataracts, Chronic sinus problems/congestion, Weeks Of Treatment: 7 History: Coronary Artery Disease, Hypertension, Clustered Wound: No Peripheral Arterial Disease, Type II Diabetes, Osteoarthritis, Neuropathy Photos Wound Measurements Length: (cm) 0.6 % Reduction in A Width: (cm) 0.6 % Reduction in V Depth: (cm) 0.1 Epithelializatio Area: (cm) 0.283 Tunneling: Volume: (cm) 0.028 Undermining: Wound Description Classification: Grade 1 Foul Odor After Wound Margin: Thickened Slough/Fibrino Exudate Amount: Small Exudate Type: Serosanguineous Exudate Color: red, brown Wound Bed Granulation Amount: Large (67-100%) Granulation Quality: Pink Fascia Exposed: Necrotic Amount: Small (1-33%) Fat Layer (Subc Necrotic Quality: Adherent Slough Tendon Exposed: Muscle Exposed: Joint Exposed: Bone Exposed: Electronic Signature(s) Signed: 09/24/2019 2:51:44 PM By: Benjaman Kindler EMT/HBOT Signed: 10/08/2019 12:09:06 PM By: Zandra Abts RN, BSN Previous Signature: 09/22/2019 2:18:43 PM Version By: Darcey Nora Entered By: Benjaman Kindler on 11/25/ Cleansing: No No Exposed Structure No utaneous Tissue) Exposed: Yes  No No No No ara RN, BSN 2020 08:16:06 rea: -349.2% olume: -115.4% n: Medium (34-66%) No No -------------------------------------------------------------------------------- Vitals Details Patient Name: Date of Service: [name], [name] 09/18/2019 12:30 PM Medical Record [id number] Patient Account Number: [id number] Date of Birth/Sex: Treating RN: [birth year] (73 y.o. [name] Primary Care Khushi Zupko: [name] Other Clinician: Referring Zamir Staples: Treating Kerianne Gurr/Extender:[name], [name], [name] in Treatment: 7 Vital Signs Time Taken: 13:07 Temperature (F): 97.0 Height (in): 69 Pulse (bpm): 81 Weight (lbs): 210 Respiratory Rate (breaths/min): 16 Body Mass Index (BMI): 31 Blood Pressure (mmHg): 110/53 Capillary Blood Glucose (mg/dl): 140 Reference Range: 80 - 120 mg / dl Notes glucose per pt report Electronic Signature(s) Signed: 09/22/2019 2:18:43 PM By: Levan Hurst RN, BSN Entered By: Levan Hurst on 09/18/2019 13:08:15

## 2019-09-23 ENCOUNTER — Ambulatory Visit (HOSPITAL_BASED_OUTPATIENT_CLINIC_OR_DEPARTMENT_OTHER): Payer: Medicare Other | Admitting: Physician Assistant

## 2019-09-24 ENCOUNTER — Other Ambulatory Visit: Payer: Self-pay

## 2019-09-24 ENCOUNTER — Encounter (HOSPITAL_BASED_OUTPATIENT_CLINIC_OR_DEPARTMENT_OTHER): Payer: Medicare Other | Admitting: Physician Assistant

## 2019-09-24 DIAGNOSIS — E11621 Type 2 diabetes mellitus with foot ulcer: Secondary | ICD-10-CM | POA: Diagnosis not present

## 2019-09-24 NOTE — Progress Notes (Signed)
Guzman, Todd ([id number]) Visit Report for 09/24/2019 Chief Complaint Document Details Patient Name: Date of Service: [name], [name] 09/24/2019 9:30 AM Medical Record [id number] Patient Account Number: [id number] Date of Birth/Sex: Treating RN: 04-09-1946 (73 y.o. M) Primary Care Provider: [name] Other Clinician: Referring Provider: Treating Provider/Extender:Stone III, Zadie Rhine, [name] in Treatment: 8 Information Obtained from: Patient Chief Complaint Patient here for review of a nonhealing wound at a right transmetatarsal amputation site 07/09/17; patient is here out of concerns for 2 spots on his right transmetatarsal site 06/25/18. Patient is here for an area on his right transmetatarsal amputation site mid aspect 07/29/2019;. Patient is here for review of a wound on his left plantar fifth metatarsal head Electronic Signature(s) Signed: 09/24/2019 4:52:33 PM By: Worthy Keeler PA-C Entered By: Worthy Keeler on 09/24/2019 09:57:11 -------------------------------------------------------------------------------- HPI Details Patient Name: Date of Service: [name], [name] 09/24/2019 9:30 AM Medical Record [id number] Patient Account Number: [id number] Date of Birth/Sex: Treating RN: 16-Jun-1946 (73 y.o. M) Primary Care Provider: [name] Other Clinician: Referring Provider: Treating Provider/Extender:Stone III, Zadie Rhine, [name] in Treatment: 8 History of Present Illness HPI Description: 05/18/16; this is a 73year-old diabetic who is a type II diabetic on insulin. The history is that he traumatized his right foot developed a sore sometime in late March. Shortly thereafter he went on a cruise but he had to get off the cruise ship in [place] and fly urgently back to [place] where he was admitted to [facility] and ultimately underwent a transmetatarsal amputation by Dr. 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 According to the  patient and his wife this wound never really healed. He was seen on 2 occasions in the wound care center in [place] and had vascular studies and then was referred urgently to Dr. Bridgett Larsson of vascular surgery. He underwent an angiogram on 05/10/16. Unfortunately nothing really could be done to improve his vascular status. He had a 75-90% stenosis in the midsegment of 1 segment of the posterior femoral artery. He had a patent popliteal, his anterior tibial occluded shortly after takeoff. Perineal had a greater than 90% stenosis posterior tibial is occluded feet had no distal collaterals feed distal aspect of the transmetatarsal amputation site. The patient tells me that he had a prolonged period of Santyl by Dr. Doran Durand was some initial improvement but then this was stopped. I think they're only applying daily dressings/dry dressings. He has not had a recent x-ray of the right foot he did have one before his surgery in April. His wife by the dimensions of the wound/surgical site being followed at home since 4/20. At that point the dimensions were 0.5 x 12 x 0.2 on 7/19 this was 1.8 x 6 x 0.4. He is not currently on any antibiotics. His hemoglobin A1c in early April was 12.9 at that point he was started on insulin. Apparently his blood sugars are much lower he has an appointment with Dr. Legrand Como Alteimer of endocrine next week. 05/29/16 x-ray of the area did not show osteomyelitis. I think he probably needs an MRI at this point. His wife is asking about something called"Yireh" cream which is not FDA approved. I have not heard of this. 06/22/16; MRI did not really suggest osteomyelitis. There was minimal marrow edema and enhancement in the stump of the second metatarsal felt to be secondary likely to postoperative change rather than osteomyelitis. The patient has arranged his own consultation with Dr. Andree Elk at [place], apparently their daughter lives in [place] and  has some connection here. Any improvement in  vascular supply by Dr. Andree Elk would of course be helpful. Dr. Bridgett Larsson did not feel that anything further could be done other than amputation if wound care did not result in healing or if the area deteriorates. 06/26/16; the patient has been to see Dr. Andree Elk at [place] and had an angiogram. He is going for a procedure on Thursday which will involve catheterization. I'm not sure if this is an anterograde or retrograde approach. He has been using Santyl to the wound 07/10/16; the patient had a repeat angiogram and angioplasty at [facility] by Dr. Brunetta Jeans. His angiogram showed right CFA and profundal widely patent. The right as of a.m. popliteal artery were widely patent the right anterior tibial was occluded proximally and reconstitutes at the ankle. Peroneal artery was patent to the foot. Posterior tibial artery was occluded. The patient had angioplasty of the anterior tibial artery.. This was quite successful. He was recommended for Plavix as well as aspirin. 07/17/16; the patient was close to be a nurse visit today however the outer dressing of the Apligraf fell off. Noted drainage. I was asked to see the wound. The patient is noted an odor however his wife had noted that. Drainage with Apligraf not necessarily a bad thing. He has not been systemically unwell 07/24/16; we are still have an issue with drainage of this wound. In spite of this I applied his second Apligraf. Medially the area still is probing to bone. 08/07/16; Apligraf reapplied in general wound looks improved. 08/21/16 Apligraf #4. Wound looks much better 09/04/16 patientt's wound again today continues to appear to improve with the application of the Apligraf's. He notes no increased discomfort or concerns at this point in time. 09/18/16; the patient returns today 2 weeks after his fifth application of Apligraf. Predictably three quarters of the width of this wound has healed. The deep area that probe to bone medially is still open. The patient  asked how much out-of-pocket dollars would be for additional Apligraf's. 09/25/16; now using Hydrofera Blue. He has completed 5 Apligraf applications with considerable improvement in this deep open transmetatarsal amputation site. His wound is now a triangular-shaped wound on the medial aspect. At roughly 12 to 2:00 this probes another centimeter but as opposed to in the past this does not probe to bone. The patient has been seen at [facility] by Dr. Zenia Resides. He is not planning to do any more revascularization unless the wound stalls or worsens per the patient 10/02/16; 0.7 x 0.8 x 0.8. Unfortunately although the wound looks stable to improved. There is now easily probable bone. This hasn't been present for several weeks. Patient is not otherwise symptomatic he is not experiencing any pain. I did a culture of the wound bed 10/09/16. Deterioration last week. Culture grew MRSA and although there is improvement here with doxycycline prescribed over the phone I'm going to try to get him linezolid 600 twice a day for 10 days today. 10/16/16; he is completing a weeks worth of linezolid and still has 3 more days to go. Small triangular-shaped open area with some degree of undermining. There is still palpable bone with a curet. Overall the area appears better than last week 10/20/16 he has completed the linezolid still having some nausea and vomiting but no diarrhea. He has exposed bone this week which is a deterioration. 10/27/16 patient now has a small but probing wound down to bone. Culture of this bone that I did last week showed a few  methicillin-resistant staph aureus. I have little doubt that this represents acute/subacute osteomyelitis. The patient is currently on Doxy which I will continue he also completed 10 days of linezolid. We are now in a difficult situation with this patient's foot after considerable discussion we will send him back to see Dr. Doran Durand for a surgical opinion of this I'm also going  to try to arrange a infectious disease consult at [facility] hopefully week and get this prior to her usual 4-6 weeks we having Margaret. The patient clearly is going to need 6 weeks of IV vancomycin. If we cannot arrange this expediently I'll have to consider ordering this myself through a home infusion company 11/03/16; the patient now has a small in terms of circumference but probing wound. No bone palpable today. The patient remains on doxycycline 100 twice a day which should support him until he sees infectious disease at [facility] next week the following week on Wednesday I believe he has an appointment with Dr. Andree Elk at [facility] who is his vascular cardiologist. Finally he has an appointment with Dr. Doran Durand on 11/22/16 we have been using silver alginate. The patient's wife states they are having trouble getting this through [facility] 11/13/16; the patient was seen by infectious disease at [facility] in the 11th PICC line placed in preparation for IV antibiotics. A tummy he has not going to get IV vancomycin o Ceftaroline. They've also ordered an MRI. Patient has a follow-up with Dr. Doran Durand on 11/22/16 and Dr. Andree Elk at [facility] tomorrow 11/23/16 the patient is on daptomycin as directed by infectious disease at [facility]. He is also been back to see Dr. Andree Elk at [facility]. He underwent a repeat arteriogram. He had a successful PTA of the right anterior tibial artery. He is on dual antiplatelete treatment with Plavix and aspirin. Finally he had the MRI of his foot in [place]. This showed cellulitis about the foot worse distally edema and enhancement in the reminiscent of the second metatarsal was consistent with osteomyelitis therefore what I was assuming to be the first metatarsal may be actually the second. He also has a fluid collection deep to the calcaneus at the level of the calcaneal spur which could be an abscess or due to adventitial bursitis. He had a small tear in his Achilles 11/30/16; the  patient continues on daptomycin as directed by infectious disease at [facility]. He is been revascularized by Dr. Andree Elk at [facility] in [place]. He has been to see Gretta Arab who was the orthopedic surgeon who did his original amputation. I have not seen his not however per the patient's wife he did not offer another surgical local surgical prodecure to remove involved bone. He verbalized his usual disbelief in not just hyperbarics but any medical therapy for this condition(osteomyelitis). I discussed this in detail with the patient today including answering the question about a BKA definitively "curing" the current condition. 12/07/16; the patient continues on daptomycin as directed by infectious disease at [facility]. This is directed at the MRSA that we cultured from his bone debridement from 12/22. Lab work today shows a white count of 8.7 hemoglobin of 10.5 which is microcytic and hypochromic differential count shows a slightly elevated monocyte count at 1.2 eosinophilic count of 0.6. His creatinine is 1.11 sedimentation rate apparently is gone from 35-34 now 40. I explained was wife I don't think this represents a trend. His total CK is 48 12/14/16- patient is here for follow-up evaluation of his right TMA site. He continues to  receive IV daptomycin per infectious disease. His serum inflammatory markers remain elevated. He complains of intermittent pain to the medial aspect of the TMA site with intermittent erythema. He voices no complaints or concerns regarding hyperbaric therapy. Overall he and his wife are expressing a frustration and discouragement regarrding the length of time of treatment. 12/21/16; small open wound at roughly the first or second metatarsal metatarsalphalyngeal joint reminiscence of his transmetatarsal amputation site he continues to receive IV daptomycin per infectious disease at [facility]. He will finish these a week tomorrow. He has lab work which I been copied on. His white  count is 10.8 hemoglobin 8.9 MCV is low at 75., MCH low at 24.5 platelet count slightly elevated at 626. Differential count shows 70% neutrophils 10% monocytes and 10% eosinophils. His comprehensive metabolic panel shows a slightly low sodium at 133 albumin low at 3.1 total CK is normal at 42 sedimentation rate is much higher at 82. He has had iron studies that show a serum iron of 15 and iron binding capacity of 238 and iron saturation of 6. This is suggestive of iron deficiency. B12 and folate were normal ferritin at 113 The patient tells me that he is not eating well and he has lost weight. He feels episodically nauseated. He is coughing and gagging on mucus which she thinks is sinusitis. He has an appointment with his primary doctor at 5:00 this afternoon in [facility] 01/02/17; the patient developed a subacute pneumonitis. He was admitted to [facility] after a CT scan showed an extensive interstitial pneumonitis [I have not yet seen this]. 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 He has since been discharged. He is not on oxygen. He feels fatigued and very short of breath with exertion. At [facility] the wound care nurse there felt that his wound was healed. He did complete his daptomycin and has follow-up with infectious disease on Friday. X-rays I did before he went to [facility] still suggested residual osteomyelitis in the anterior aspect of the second must metatarsal head. I'm not sure I would've expected any different. There was no other findings. I actually think I did this because of erythema over the first metatarsal head reminiscent 01/11/17; the patient has eosinophilic pneumonitis. He has been reviewed by pulmonology and given clearance for hyperbarics at least that's what his wife says. I'll need to see if there is note in care everywhere. Apparently the prognosis for improvement  of daptomycin induced eosinophilic granulocyte is is 3 months without steroids. In the meantime infectious disease has placed him on doxycycline until the wound is closed. He is still is lost a lot of weight and his blood sugars are running in the mid 60s to low 80s fasting and at all times during the day 01/18/17; he has had adjustments in his insulin apparently his blood sugars in the morning or over 100. He wants to restart his hyperbaric treatment we'll do this at 1:00. 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 I think there is good reason to complete his treatments in order to give him the best chance of maintaining a healed status and these DFU 3 wounds with MRSA infection in the bone 02/15/17; the patient was seen today in conjunction with HBO. He completed hyperbaric oxygen today. The open area on his transmetatarsal site has remained closed. There was an area of erythema  when I saw him earlier in the week on the posterior heel although that is resolved as of today as well. He has been using a cam walker. This is a patient who came to [place] after a transmetatarsal amputation that was necrotic and dehisced. He required revascularization percutaneously on 2 different occasions by Dr. Andree Elk of invasive cardiology at [facility]. 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 He went to [facility] infectious disease and had IV daptomycin for 5 weeks before developing eosinophilic pneumonitis and requiring an admission to hospital/ICU. He made a good recovery and is continued on doxycycline since. As mentioned his foot is closed now. He has a follow-up with Dr. Andree Elk tomorrow. He is going to [employer] on Monday for a custom-made shoe READMISSION Last visit Dr. Andree Elk [facility] 02/16/17 1. Critical limb ischemia of the RLE: s/p  transmetatarsal amputation of the RLE, now completely healed. s/p right ATA percutaneous revascularization procedure x2, most recently 11/20/2016 s/p PTA of right AT 100% to less than 20% with a 2.5 x 200 balloon. He does have some residual osteomyelitis, but given the wound is closed, the orthopedist has recommended to follow. He has a fitting for a special shoe coming up next week. He has completed a course of daptomycin and doxycycline and he has been discharged by ID. He has completed a course of hyperbaric therapy. Continue Continue medical management with aspirin, Plavix and statin therapy. We will discuss ongoing Plavix therapy at follow-up in 6 months. 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 Given no symptoms, we will conservatively manage. He doesn't want to do any more invasive studies at this time, which is reasonable. The patient arrives today out of 2 concerns both on the right transmetatarsal site. 1 at the level of the reminiscent fifth metatarsal head and the other at roughly the first or second. Both of these look like dark subcutaneous discoloration probably subdermal bleeding. He is recently obtained new adaptive footwear for the right foot. He also has a callus on the left fifth dorsal toe however he follows with podiatry for this and I don't think this is any issue. ABIs in this clinic today were 0.66 on the right and 1.06 on the left. On entrance into our clinic initially this was 0.95 and 0.92. He does not describe current claudication. They're going away on a cruise in 8 weeks and I think are trying to do the month is much as they can proactively. They follow with podiatry and have an appointment with Dr. Andree Elk in October READMISSION 06/25/18 This is a patient that we have not seen in almost a year. He is a type II diabetic with known PAD. He  is followed by Dr. Andree Elk of interventional cardiology at [facility] in [place]. He is required revascularization for significant PAD. When we first saw him he required a transmetatarsal amputation. He had underlying osteomyelitis with a nonhealing surgical wound. This eventually closed with wound care, IV antibiotics and hyperbaric oxygen. They tell me that he has a modified shoe and he is very active walking up to 4 miles a day. He is followed by Dr. Geroge Baseman of podiatry. His wife states that he underwent a removal of callus over this site on July 9. She felt there may be drainage from this site after that  although she could never really determined and there was callus buildup again. On 06/01/18 there was pressure bleeding through the overlying callus. he was given a prescription for 7 days of Bactrim. Fortuitously he has an appointment with Dr. Andree Elk on 06/04/18 and he immediately underwent revascularization of the right leg although I have not had a chance to review these records in care everywhere. This was apparently done through anterior and retrograde access. On 06/06/18 he had another debridement by Dr. Bernette Mayers. Vitamin soaking with Epsom salts for 20 minutes and applying calcium alginate. The original trans-met was on April 2017 I believe by Dr. Doran Durand. His ABI in our clinic was noncompressible today. 07/02/18; x-ray I ordered last week was negative for osteomyelitis. Swab culture was also negative. He is going to require an MRI which I have ordered today. 07/09/18; surprisingly the MRI of the foot that I ordered did not show osteomyelitis. He did suggest the possibility of cellulitis. For this reason I'll go ahead and give him a 10 day course of doxycycline. Although the previous culture of this area was negative 07/16/18; it arrives with the wound looking much the same. Roughly the same depth. He has thick subcutaneous tissue around the wound orifice but this still has roughly the same depth.  Been using silver alginate. We applied Oasis #1 today 07/23/2018; still having to remove a lot of callus and thick subcutaneous tissue to actually define the wound here. Most of this seems to have closed down yet he has a comma shaped divot over the top of the area that still I think is open. We applied Oasis #2 His wife expressed concern about the tip of his left great toe. This almost looks like a small blister. She also showed it today to her podiatrist Dr. Geroge Baseman who did not think this was anything serious. I am not sure is anything serious either however I think it bears some watching. He is not in any pain however he is insensate 07/30/2018;; still on a lot of nonviable tissue over the surface of the wound however cleaning this up reveals a more substantial wound orifice but less of a probing wound depth. This is not probed to bone. There is no evidence of infection The wife is still concerned about a non-open area on the tip of his left great toe. Almost feels like a bony outgrowth. She had previously showed this to podiatry. I do not think this is a blister. A friction area would be possible although he is really not walking according to his wife. He had a small skin tag on his right buttock but no open wound here either 08/06/2018; we applied a third Oasis last week. Unfortunately there is really no improvement. Still requiring extensive debridement to expose the wound bed from a horizontal slitlike depression. I still have not been able to get this to fill in properly. On the positive side there is now no probable bone from when he first came into the facility. I changed him to silver alginate today after a reasonably aggressive debridement 08/13/2018; once again the patient comes in with skin and subcutaneous tissue closing over the small probing area with the underlying cavity of the wound on the right TMA site. I applied silver alginate to this last week. Prior to that we used Oasis  x3 still not able to get this area granulating. He does not have a probing area of the bone which is an improvement from when I spur started working on this  and an MRI did not suggest osteomyelitis. I do not see evidence of infection here but I am increasingly concerned about why I cannot get this area to granulate. Each time I debrided this is looks like this is simply a matter of getting granulation to fill in the hole and then getting epithelialization. This does not seem to happen Also I sent him back to see podiatry Dr. Earleen Newport about the what felt to be bony outgrowth on the tip of his left great toe. Apparently after heel he left the clinic last week or the next day he developed a blood blister. He did see Dr. Earleen Newport. He went on to have a debridement of the medial nail cuticle he now has an open area here as well as some denuded skin. They did an x-ray apparently does have a bony outgrowth or spur but I am not able to look at this. They are using topical antibiotics apparently there was some suggested he use Santyl. Patient's wife was anxious for my opinion of this 08/20/2018; we are able to keep the wound open this time instead of the thick subcutaneous tissue closing over the top of it however unfortunately once again this probes to bone. I do not see any evidence of infection and previous MRI did not show osteomyelitis. I elected to go back to the Oasis to see if we can stimulate some granulation. Last saw his vascular interventional cardiologist Dr. Andree Elk at the beginning of August and he had a repeat procedure. Nevertheless I wonder how much blood flow he has down to this area With regards to the left first toe he is seeing Dr. Earleen Newport next week. They are applying Bactroban to this area. 08/27/2018 Once again he comes in with thick eschar and subcutaneous tissue over the top of the small probing hole. This does not appear to go down to bone but it still has roughly the same depth. I  reapplied Oasis today Over the left great toe there appears to be more of the wound at the tip of his toe than there was last week. This has an eschar on the surface of it. Will change to Santyl. There is seeing podiatry this afternoon 09/03/2018 He comes in today with the area on the transmetatarsal's site with a fair amount of callus, nonviable tissue over the circumference but it was not closed. I removed all of this as well as some subcutaneous debris and reapplied Oasis. There is no exposed bone The area over the tip of the left great toe started off as a nodule of uncertain etiology. He has been followed with podiatry. They have been removing part of the medial nail bed. He has nonviable tissue over the wound he been using Santyl in this area 09/10/18 Unfortunately comes in with neither wound area looking improved. The transmetatarsal amputation site once Again has nonviable debris over the surface requiring debridement. Unfortunately underneath this there is nothing that looks viable and this once again goes right down to bone. There is no purulent drainage and no erythema. Also the surgical wound from podiatry on the left first toe has an ischemic-looking eschar over the surface of the tip of the toe I have elected not to attempt candidly debride this His wife as arranged for him to have follow-up noninvasive studies in [place] under the care of Dr. Andree Elk clinic. The question is he has known severe PAD. They had recently seen him in August. He has had revascularizations in both legs within the last  4 or 5 months. He is not complaining of pain and I cannot really get a history of claudication. He has not systemically unwell 09/20/2018 the patient has been to [facility] and been revascularized by Dr. Andree Elk earlier this week. Apparently he was able to open up the anterior tibial artery although I have not actually seen his formal report. He is going for an attempt to revascularize on the  left on Monday. He is apparently working with an investigational stent for lower extremity arteries below the knee and he has talked to the patient about placing that on Monday if possible. The patient has been using silver alginate on the transmetatarsal amputation site and Santyl on the left 09/30/2018; patient had his revascularization on the left this apparently included a standard approach as well as a more distal arterial catheterization although I do not have any information on this from Dr. Andree Elk. In fact I do not even see the initial revascularization that he had on the right. I have included the arterial history from Dr. Andree Elk last note however below; ASSESSMENT/PLAN: 1. Hx of Critical limb ischemia bilateral lower extremities, PAD: -s/p transmetatarsal amputation of the RLE. -s/p right ATA percutaneous revascularization procedure x2, most recently 11/20/2016 s/p PTA of right AT 100% to less than 20% with a 2.5 x 200 balloon. 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 -03/21/2018 s/p PTA of 90% left popliteal artery to <10% with a 5x20 cutting balloon, PTA of 90% left peroneal to <20% with a 3x20 balloon, PTA of 100% left AT to <20% with a 2.5x220 balloon. 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 Will plan on the LLE soon after. Risks/benefits of procedure have been discussed and patient has elected to proceed. We have been using endoform to the right TMA amputation site wound and Santyl to the left great toe 10/07/2018; the area on the tip of his left great toe looked better we have been using Santyl here. We continue to have a very difficult probing hole on the right TMA amputation site we have been using  endoform. I went on to use his fifth Oasis today 10/14/2018; the tip of the left great toe continues to look better. We have been using Santyl here the surface however is healthy and I think we can change to an alginate. The right TMA has not changed. Once again he has no superficial opening there is callus and thick subcutaneous tissue over the orifice once you remove this there is the probing area that we have been dealing with without too much change. There is no palpable bone I have been placing Oasis here and put Oasis #6 in this today after a more vigorous debridement 10/21/18; the left great toe still has necrotic surface requiring debridement. The right TMA site hasn't changed in view of the thick callus over the wound bed. With removal of this there is still the opening however this does not appear to have the same depth. Again there is no palpable bone. Oasis was replaced 10/28/2018; patient comes in with both wounds looking worse. The area over the first toe tip is now down to bone. The area over the TMA site is deeper and down to bone clearly with a increase in  overall wound area. Equally concerning on the right TMA is the complete absence of a pulse this week which is a change. We are not even able to Doppler this. On the right he has a noncompressible ABI greater than 1.4 11/04/2018. Both wounds look somewhat worse. X-rays showed no osteomyelitis of the right foot but on the left there was underlying osteomyelitis in the left great toe distal phalanx. I been on the phone to Dr. Andree Elk surface at [facility] in [place] and they are arranging for another angiogram on the right on Monday. I have him on doxycycline for the osteomyelitis in the left great toe for now. Infectious disease may be necessary 1/17; 2-week hiatus. Patient was admitted to hospital at [facility]. My understanding is he underwent an angioplasty of the right anterior tibial artery and had stents placed in the left anterior  artery and the left tibial peroneal trunk. This was done by Dr. Andree Elk of interventional radiology. There is no major change in either 1 of the wounds. They have been using Aquacel Ag. As far as they are aware no imaging studies were done of the foot which is indeed unfortunate. I had him on doxycycline for 2 weeks since we identified the osteomyelitis in the left great toe by plain x-ray. I have renewed that again today. I am still suspicious about osteomyelitis in the amputation site and would consider doing another MRI to compare with the one done in September. The idea of hyperbaric oxygen certainly comes up for discussion 1/24; no major change in either wound area. I have him on doxycycline for osteomyelitis at the tip of the left great toe. As noted he has been previously and recently revascularized by Dr. Andree Elk at [place]. He is tolerating the doxycycline well. For some reason we do not have an infectious disease consult yet. Culture of drainage from the right foot site last week was negative 1/31; MRI of the right foot did not show osteomyelitis of the right ankle and foot. Notable for a skin ulceration overlying the second metatarsal stump with generalizing soft tissue edema of the ankle and foot consistent with cellulitis. Noted to have a partial-thickness tear of the Achilles tendon 7.5 cm proximal to the insertion Nothing really new in terms of symptoms. Patient's area on the tip of the left great toe is just about closed although he still has the probing area on the metatarsal amputation site. Appointment with Dr. Linus Salmons of infectious disease next week 2/7; Dr. Novella Olive did not feel that any further antibiotics were necessary he would follow-up in 2 months. The left great toe appears to be closed still some surface callus that I gently looked under high did not see anything open or anything that was threatening to be open. He still has the open area on the mid part of his TMA site using  endoform 2/14; left great toe is closed and he is completing his doxycycline as of last Sunday. He is not on any antibiotics. Unfortunately out of the right foot his wife noticed some subdermal hemorrhage this week. He had been walking 2 miles I had given him permission to do so this is not really a plantar wound. Using endoform to this wound but I changed to silver alginate this week 2/21; left great toe remains closed. Culture last week grew Streptococcus angiosis which I am not really familiar with however it is penicillin sensitive and I am going to put him on Augmentin. Not much change in the wound on  the right foot the deep area is still probing precariously close to bone and the wound on the margin of the TMA is larger. 2/28; left great toe remains closed. He is completing the Augmentin I gave him last week. 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 This is being shown to Dr. Andree Elk at [place] to see if there is anything else that can be done here. He has been using silver alginate strips on the right 3/6; left great toe remains closed. He sees Dr. Andree Elk on Monday. The area on the plantar aspect of the right foot has the same small orifice with thick callused tissue around this. However this time with removal of the callus tissue the wound is open all the way along the incision line to the end medially. We have been using silver alginate. 3/13; left toe remains closed although the area is callused. He sees Dr. Jacqualyn Posey of podiatry next week. The area on the plantar right foot looked a lot better this week. Culture I did of this was negative we use silver alginate. He is going next week for an attempt at revascularization by Dr. Andree Elk 3/23; left toe remains closed although the area is callused. He will see Dr. Jacqualyn Posey in follow-up. The area on the right plantar foot continues to look surprisingly better over the last 3 visits. We have been using silver  alginate. The revascularization he was supposed to have by Dr. Andree Elk at [facility] in [place] has been canceled [facility] procedure] 4/6; the right foot remains closed albeit callused. Podiatry canceled the appointment with regards to the left great toe. He has not seen Dr. Andree Elk at [facility] but thinks that Dr. Andree Elk has "done all he can do". He would be a candidate for the hemostaemix trial Readmission 07/29/2019 Mr. Stann Mainland is a man we know well from at least 3 previous stays in this clinic. He is a type II diabetic with severe PAD followed by Dr. Andree Elk at [facility] in [place]. 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 So far everything in both of these areas has remained closed. About 2 weeks ago he went to see his podiatrist [facility] Dr. Babs Bertin. He had a thick callus on the left fifth metatarsal head that was shaved. He has developed an open wound in this area. They have been offloading this in his diabetic shoes. The patient has not had any more revascularizations by Dr. Andree Elk since the last time he was here. ABI in our clinic at the posterior tibial on the left was 1.06 10/6; no real change in the area on the plantar met head. Small wound with 2 mm of depth. He has thick skin probably from pressure around the wound. We have been using silver alginate 10/13; small wound in the left fifth plantar met head. Arrives today with undermining laterally and purulent drainage. Our intake nurse cultured this. His wife stated they noticed a change in color over the last day or 2. He is not systemically unwell 10/19; small wound on the fifth plantar metatarsal head. Culture I did last week showed Staphylococcus lugdunensis. Although this could be a skin contaminant the possibility of a skin and soft tissue infection was there. I did give him empiric doxycycline  which should have covered this. We are using silver alginate  to the wound. We put him in a total contact cast today. 10/22; small wound on the fifth plantar metatarsal head. He has completed antibiotics. He also has severe PAD which worries me about just about any wound on this man's foot 10/29; small superficial area on the fifth plantar metatarsal head. Measuring slightly smaller. He is not currently on any antibiotics. I been using a total contact cast in this man with very severe PAD but he seems to be tolerating this well 11/5; left plantar fifth metatarsal head. Silver alginate being used under a total contact cast 11/12; wound not much different than last week. Using a #15 scalpel debridement around the wound. Still silver collagen under a total contact cast. He has severe PAD and that may be playing a role in this 11/19; disappointing that the wound is not really changed that much. I think it is come down in overall surface area because originally this was on the lateral part of the fifth metatarsal head however recently it is not really changed. Most of this is filled in but it will not epithelialized there is surface debris on this which may be mostly related to ischemia. They have an appointment with Dr. Andree Elk on 12/9 09/24/2019 on evaluation today patient appears to be doing somewhat better with regard to the wound on the left fifth metatarsal head. Fortunately there does not appear to be any signs of active infection at this time. No fevers, chills, nausea, vomiting, or diarrhea. The wound does not appear to be completely closed and I do feel like the Hydrofera Blue is helping to some degree although I feel like the cast as well as keeping things from breaking down or getting any larger at least. As far as his wound overview it shows that the overall size of the wound is slightly smaller but really maintaining within the realm of about the same. He had no troubles with the cast which  is good news. Electronic Signature(s) Signed: 09/24/2019 4:52:33 PM By: Worthy Keeler PA-C Entered By: Worthy Keeler on 09/24/2019 11:46:28 -------------------------------------------------------------------------------- Physical Exam Details Patient Name: Date of Service: [name], [name] 09/24/2019 9:30 AM Medical Record [id number] Patient Account Number: [id number] Date of Birth/Sex: Treating RN: [birth year] (73 y.o. M) Primary Care Provider: [name] Other Clinician: Referring Provider: Treating Provider/Extender:Stone III, Zadie Rhine, [name] in Treatment: 8 Constitutional Well-nourished and well-hydrated in no acute distress. Respiratory normal breathing without difficulty. Psychiatric this patient is able to make decisions and demonstrates good insight into disease process. Alert and Oriented x 3. pleasant and cooperative. Notes Patient's wound bed currently did not require any sharp debridement none was performed at this point and overall he seems to be doing quite well based on what I am seeing. He does have somewhat poor vascular flow he sees his vascular doctor on December 9. Other than that overall he seems to feel like things are maintaining but really not getting much better or worse. He tolerated the cast fairly well. For that reason we did go ahead and reapply the total contact cast here today in the office which the patient tolerated without complication. I applied that myself. We will stick with the Hydrofera Blue dressing for now. Electronic Signature(s) Signed: 09/24/2019 4:52:33 PM By: Worthy Keeler PA-C Entered By: Worthy Keeler on 09/24/2019 11:47:07 -------------------------------------------------------------------------------- Physician Orders Details Patient Name: Date of Service: [name], [name] 09/24/2019 9:30 AM Medical Record [id number] Patient Account Number: [id number] Date of Birth/Sex: Treating RN: 12-20-1945 (73  y.o.  [name] Primary Care Provider: [name] Other Clinician: Referring Provider: Treating Provider/Extender:Stone III, Zadie Rhine, [name] in Treatment: 8 Verbal / Phone Orders: No Diagnosis Coding ICD-10 Coding Code Description E11.621 Type 2 diabetes mellitus with foot ulcer E11.51 Type 2 diabetes mellitus with diabetic peripheral angiopathy without gangrene L97.521 Non-pressure chronic ulcer of other part of left foot limited to breakdown of skin L03.116 Cellulitis of left lower limb Follow-up Appointments Return Appointment in 1 week. Dressing Change Frequency Wound #5 Left Metatarsal head fifth Do not change entire dressing for one week. Wound Cleansing Wound #5 Left Metatarsal head fifth May shower with protection. Primary Wound Dressing Wound #5 Left Metatarsal head fifth Hydrofera Blue Secondary Dressing Wound #5 Left Metatarsal head fifth Foam - foam donut pad foot. Dry Gauze Off-Loading Total Contact Cast to Left Lower Extremity Electronic Signature(s) Signed: 09/24/2019 3:14:28 PM By: Baruch Gouty RN, BSN Signed: 09/24/2019 4:52:33 PM By: Worthy Keeler PA-C Entered By: Baruch Gouty on 09/24/2019 10:56:59 -------------------------------------------------------------------------------- Problem List Details Patient Name: Date of Service: [name], [name] 09/24/2019 9:30 AM Medical Record [id number] Patient Account Number: [id number] Date of Birth/Sex: Treating RN: 01/01/1946 (73 y.o. M) Primary Care Provider: [name] Other Clinician: Referring Provider: Treating Provider/Extender:Stone III, Zadie Rhine, [name] in Treatment: 8 Active Problems ICD-10 Evaluated Encounter Code Description Active Date Today Diagnosis E11.621 Type 2 diabetes mellitus with foot ulcer 07/29/2019 No Yes E11.51 Type 2 diabetes mellitus with diabetic peripheral 07/29/2019 No Yes angiopathy without gangrene L97.521 Non-pressure chronic ulcer  of other part of left foot 07/29/2019 No Yes limited to breakdown of skin L03.116 Cellulitis of left lower limb 08/12/2019 No Yes Inactive Problems Resolved Problems Electronic Signature(s) Signed: 09/24/2019 4:52:33 PM By: Worthy Keeler PA-C Entered By: Worthy Keeler on 09/24/2019 09:57:05 -------------------------------------------------------------------------------- Progress Note Details Patient Name: Date of Service: [name], [name] 09/24/2019 9:30 AM Medical Record [id number] Patient Account Number: [id number] Date of Birth/Sex: Treating RN: 1946/04/02 (73 y.o. M) Primary Care Provider: [name] Other Clinician: Referring Provider: Treating Provider/Extender:Stone III, Zadie Rhine, [name] in Treatment: 8 Subjective Chief Complaint Information obtained from Patient Patient here for review of a nonhealing wound at a right transmetatarsal amputation site 07/09/17; patient is here out of concerns for 2 spots on his right transmetatarsal site 06/25/18. Patient is here for an area on his right transmetatarsal amputation site mid aspect 07/29/2019;. Patient is here for review of a wound on his left plantar fifth metatarsal head History of Present Illness (HPI) 05/18/16; this is a 73year-old diabetic who is a type II diabetic on insulin. The history is that he traumatized his right foot developed a sore sometime in late March. Shortly thereafter he went on a cruise but he had to get off the cruise ship in [place] and fly urgently back to [place] where he was admitted to [facility] and ultimately underwent a transmetatarsal amputation by Dr. 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 According to the patient and his wife this wound never really healed. He was seen on 2 occasions in the wound care center in [place] and had vascular studies and then was referred urgently to Dr. Bridgett Larsson of vascular surgery. He underwent an angiogram on 05/10/16. Unfortunately nothing  really could be done to improve his vascular status. He had a 75-90% stenosis in the midsegment of 1 segment of the posterior femoral artery. He had a patent popliteal, his anterior tibial occluded shortly after takeoff. Perineal had a greater than 90% stenosis posterior tibial  is occluded feet had no distal collaterals feed distal aspect of the transmetatarsal amputation site. The patient tells me that he had a prolonged period of Santyl by Dr. Doran Durand was some initial improvement but then this was stopped. I think they're only applying daily dressings/dry dressings. He has not had a recent x-ray of the right foot he did have one before his surgery in April. His wife by the dimensions of the wound/surgical site being followed at home since 4/20. At that point the dimensions were 0.5 x 12 x 0.2 on 7/19 this was 1.8 x 6 x 0.4. He is not currently on any antibiotics. His hemoglobin A1c in early April was 12.9 at that point he was started on insulin. Apparently his blood sugars are much lower he has an appointment with Dr. Legrand Como Alteimer of endocrine next week. 05/29/16 x-ray of the area did not show osteomyelitis. I think he probably needs an MRI at this point. His wife is asking about something called"Yireh" cream which is not FDA approved. I have not heard of this. 06/22/16; MRI did not really suggest osteomyelitis. There was minimal marrow edema and enhancement in the stump of the second metatarsal felt to be secondary likely to postoperative change rather than osteomyelitis. The patient has arranged his own consultation with Dr. Andree Elk at [place], apparently their daughter lives in [place] and has some connection here. Any improvement in vascular supply by Dr. Andree Elk would of course be helpful. Dr. Bridgett Larsson did not feel that anything further could be done other than amputation if wound care did not result in healing or if the area deteriorates. 06/26/16; the patient has been to see Dr. Andree Elk at [place] and had  an angiogram. He is going for a procedure on Thursday which will involve catheterization. I'm not sure if this is an anterograde or retrograde approach. He has been using Santyl to the wound 07/10/16; the patient had a repeat angiogram and angioplasty at [facility] by Dr. Brunetta Jeans. His angiogram showed right CFA and profundal widely patent. The right as of a.m. popliteal artery were widely patent the right anterior tibial was occluded proximally and reconstitutes at the ankle. Peroneal artery was patent to the foot. Posterior tibial artery was occluded. The patient had angioplasty of the anterior tibial artery.. This was quite successful. He was recommended for Plavix as well as aspirin. 07/17/16; the patient was close to be a nurse visit today however the outer dressing of the Apligraf fell off. Noted drainage. I was asked to see the wound. The patient is noted an odor however his wife had noted that. Drainage with Apligraf not necessarily a bad thing. He has not been systemically unwell 07/24/16; we are still have an issue with drainage of this wound. In spite of this I applied his second Apligraf. Medially the area still is probing to bone. 08/07/16; Apligraf reapplied in general wound looks improved. 08/21/16 Apligraf #4. Wound looks much better 09/04/16 patientt's wound again today continues to appear to improve with the application of the Apligraf's. He notes no increased discomfort or concerns at this point in time. 09/18/16; the patient returns today 2 weeks after his fifth application of Apligraf. Predictably three quarters of the width of this wound has healed. The deep area that probe to bone medially is still open. The patient asked how much out-of-pocket dollars would be for additional Apligraf's. 09/25/16; now using Hydrofera Blue. He has completed 5 Apligraf applications with considerable improvement in this deep open transmetatarsal amputation site. His  wound is now a triangular-shaped  wound on the medial aspect. At roughly 12 to 2:00 this probes another centimeter but as opposed to in the past this does not probe to bone. The patient has been seen at [place] by Dr. Zenia Resides. He is not planning to do any more revascularization unless the wound stalls or worsens per the patient 10/02/16; 0.7 x 0.8 x 0.8. Unfortunately although the wound looks stable to improved. There is now easily probable bone. This hasn't been present for several weeks. Patient is not otherwise symptomatic he is not experiencing any pain. I did a culture of the wound bed 10/09/16. Deterioration last week. Culture grew MRSA and although there is improvement here with doxycycline prescribed over the phone I'm going to try to get him linezolid 600 twice a day for 10 days today. 10/16/16; he is completing a weeks worth of linezolid and still has 3 more days to go. Small triangular-shaped open area with some degree of undermining. There is still palpable bone with a curet. Overall the area appears better than last week 10/20/16 he has completed the linezolid still having some nausea and vomiting but no diarrhea. He has exposed bone this week which is a deterioration. 10/27/16 patient now has a small but probing wound down to bone. Culture of this bone that I did last week showed a few methicillin-resistant staph aureus. I have little doubt that this represents acute/subacute osteomyelitis. The patient is currently on Doxy which I will continue he also completed 10 days of linezolid. We are now in a difficult situation with this patient's foot after considerable discussion we will send him back to see Dr. Doran Durand for a surgical opinion of this I'm also going to try to arrange a infectious disease consult at [facility] hopefully week and get this prior to her usual 4-6 weeks we having [place]. The patient clearly is going to need 6 weeks of IV vancomycin. If we cannot arrange this expediently I'll have to consider ordering  this myself through a home infusion company 11/03/16; the patient now has a small in terms of circumference but probing wound. No bone palpable today. The patient remains on doxycycline 100 twice a day which should support him until he sees infectious disease at [facility] next week the following week on Wednesday I believe he has an appointment with Dr. Andree Elk at [facility] who is his vascular cardiologist. Finally he has an appointment with Dr. Doran Durand on 11/22/16 we have been using silver alginate. The patient's wife states they are having trouble getting this through [facility] 11/13/16; the patient was seen by infectious disease at [facility] in the 11th PICC line placed in preparation for IV antibiotics. A tummy he has not going to get IV vancomycin o Ceftaroline. They've also ordered an MRI. Patient has a follow-up with Dr. Doran Durand on 11/22/16 and Dr. Andree Elk at [facility] tomorrow 11/23/16 the patient is on daptomycin as directed by infectious disease at [facility]. He is also been back to see Dr. Andree Elk at [facility]. He underwent a repeat arteriogram. He had a successful PTA of the right anterior tibial artery. He is on dual antiplatelete treatment with Plavix and aspirin. Finally he had the MRI of his foot in [place]. This showed cellulitis about the foot worse distally edema and enhancement in the reminiscent of the second metatarsal was consistent with osteomyelitis therefore what I was assuming to be the first metatarsal may be actually the second. He also has a fluid collection deep to the  calcaneus at the level of the calcaneal spur which could be an abscess or due to adventitial bursitis. He had a small tear in his Achilles 11/30/16; the patient continues on daptomycin as directed by infectious disease at [facility]. He is been revascularized by Dr. Andree Elk at [facility] in [place]. He has been to see Gretta Arab who was the orthopedic surgeon who did his original amputation. I have not seen his not  however per the patient's wife he did not offer another surgical local surgical prodecure to remove involved bone. He verbalized his usual disbelief in not just hyperbarics but any medical therapy for this condition(osteomyelitis). I discussed this in detail with the patient today including answering the question about a BKA definitively "curing" the current condition. 12/07/16; the patient continues on daptomycin as directed by infectious disease at [facility]. This is directed at the MRSA that we cultured from his bone debridement from 12/22. Lab work today shows a white count of 8.7 hemoglobin of 10.5 which is microcytic and hypochromic differential count shows a slightly elevated monocyte count at 1.2 eosinophilic count of 0.6. His creatinine is 1.11 sedimentation rate apparently is gone from 35-34 now 40. I explained was wife I don't think this represents a trend. His total CK is 48 12/14/16- patient is here for follow-up evaluation of his right TMA site. He continues to receive IV daptomycin per infectious disease. His serum inflammatory markers remain elevated. He complains of intermittent pain to the medial aspect of the TMA site with intermittent erythema. He voices no complaints or concerns regarding hyperbaric therapy. Overall he and his wife are expressing a frustration and discouragement regarrding the length of time of treatment. 12/21/16; small open wound at roughly the first or second metatarsal metatarsalphalyngeal joint reminiscence of his transmetatarsal amputation site he continues to receive IV daptomycin per infectious disease at [facility]. He will finish these a week tomorrow. He has lab work which I been copied on. His white count is 10.8 hemoglobin 8.9 MCV is low at 75., MCH low at 24.5 platelet count slightly elevated at 626. Differential count shows 70% neutrophils 10% monocytes and 10% eosinophils. His comprehensive metabolic panel shows a slightly low sodium at 133 albumin low  at 3.1 total CK is normal at 42 sedimentation rate is much higher at 82. He has had iron studies that show a serum iron of 15 and iron binding capacity of 238 and iron saturation of 6. This is suggestive of iron deficiency. B12 and folate were normal ferritin at 113 The patient tells me that he is not eating well and he has lost weight. He feels episodically nauseated. He is coughing and gagging on mucus which she thinks is sinusitis. He has an appointment with his primary doctor at 5:00 this afternoon in [facility] 01/02/17; the patient developed a subacute pneumonitis. He was admitted to [facility] after a CT scan showed an extensive interstitial pneumonitis [I have not yet seen this]. 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 He has since been discharged. He is not on oxygen. He feels fatigued and very short of breath with exertion. At [facility] the wound care nurse there felt that his wound was healed. He did complete his daptomycin and has follow-up with infectious disease on Friday. X-rays I did before he went to [facility] still suggested residual osteomyelitis in the anterior aspect of the second must metatarsal head. I'm not sure I would've  expected any different. There was no other findings. I actually think I did this because of erythema over the first metatarsal head reminiscent 01/11/17; the patient has eosinophilic pneumonitis. He has been reviewed by pulmonology and given clearance for hyperbarics at least that's what his wife says. I'll need to see if there is note in care everywhere. Apparently the prognosis for improvement of daptomycin induced eosinophilic granulocyte is is 3 months without steroids. In the meantime infectious disease has placed him on doxycycline until the wound is closed. He is still is lost a lot of weight and his blood sugars are running in the mid 60s to low  80s fasting and at all times during the day 01/18/17; he has had adjustments in his insulin apparently his blood sugars in the morning or over 100. He wants to restart his hyperbaric treatment we'll do this at 1:00. 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 I think there is good reason to complete his treatments in order to give him the best chance of maintaining a healed status and these DFU 3 wounds with MRSA infection in the bone 02/15/17; the patient was seen today in conjunction with HBO. He completed hyperbaric oxygen today. The open area on his transmetatarsal site has remained closed. There was an area of erythema when I saw him earlier in the week on the posterior heel although that is resolved as of today as well. He has been using a cam walker. This is a patient who came to [place] after a transmetatarsal amputation that was necrotic and dehisced. He required revascularization percutaneously on 2 different occasions by Dr. Andree Elk of invasive cardiology at [facility]. 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 He went to [facility] infectious disease and had IV daptomycin for 5 weeks before developing eosinophilic pneumonitis and requiring an admission to hospital/ICU. He made a good recovery and is continued on doxycycline since. As mentioned his foot is closed now. He has a follow-up with Dr. Andree Elk tomorrow. He is going to [employer] on Monday for a custom-made shoe READMISSION Last visit Dr. Andree Elk [facility] 02/16/17 1. Critical limb ischemia of the RLE:  s/p transmetatarsal amputation of the RLE, now completely healed.  s/p right ATA percutaneous revascularization procedure x2, most recently 11/20/2016 s/p PTA of right AT 100% to less than 20% with a 2.5 x 200 balloon.  He does have some residual  osteomyelitis, but given the wound is closed, the orthopedist has recommended to follow. He has a fitting for a special shoe coming up next week. He has completed a course of daptomycin and doxycycline and he has been discharged by ID. He has completed a course of hyperbaric therapy.  Continue Continue medical management with aspirin, Plavix and statin therapy. We will discuss ongoing Plavix therapy at follow-up in 6 months.  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 Given no symptoms, we will conservatively manage. He doesn't want to do any more invasive studies at this time, which is reasonable. The patient arrives today out of 2 concerns both on the right transmetatarsal site. 1 at the level of the reminiscent fifth metatarsal head and the other at roughly the first or second. Both of these look like dark subcutaneous discoloration probably subdermal bleeding.  He is recently obtained new adaptive footwear for the right foot. He also has a callus on the left fifth dorsal toe however he follows with podiatry for this and I don't think this is any issue. ABIs in this clinic today were 0.66 on the right and 1.06 on the left. On entrance into our clinic initially this was 0.95 and 0.92. He does not describe current claudication. They're going away on a cruise in 8 weeks and I think are trying to do the month is much as they can proactively. They follow with podiatry and have an appointment with Dr. Andree Elk in October READMISSION 06/25/18 This is a patient that we have not seen in almost a year. He is a type II diabetic with known PAD. He is followed by Dr. Andree Elk of interventional cardiology at [facility] in [place]. He is required revascularization for significant PAD. When we first saw him he required a transmetatarsal amputation. He had underlying osteomyelitis  with a nonhealing surgical wound. This eventually closed with wound care, IV antibiotics and hyperbaric oxygen. They tell me that he has a modified shoe and he is very active walking up to 4 miles a day. He is followed by Dr. Geroge Baseman of podiatry. His wife states that he underwent a removal of callus over this site on July 9. She felt there may be drainage from this site after that although she could never really determined and there was callus buildup again. On 06/01/18 there was pressure bleeding through the overlying callus. he was given a prescription for 7 days of Bactrim. Fortuitously he has an appointment with Dr. Andree Elk on 06/04/18 and he immediately underwent revascularization of the right leg although I have not had a chance to review these records in care everywhere. This was apparently done through anterior and retrograde access. On 06/06/18 he had another debridement by Dr. Bernette Mayers. Vitamin soaking with Epsom salts for 20 minutes and applying calcium alginate. The original trans-met was on April 2017 I believe by Dr. Doran Durand. His ABI in our clinic was noncompressible today. 07/02/18; x-ray I ordered last week was negative for osteomyelitis. Swab culture was also negative. He is going to require an MRI which I have ordered today. 07/09/18; surprisingly the MRI of the foot that I ordered did not show osteomyelitis. He did suggest the possibility of cellulitis. For this reason I'll go ahead and give him a 10 day course of doxycycline. Although the previous culture of this area was negative 07/16/18; it arrives with the wound looking much the same. Roughly the same depth. He has thick subcutaneous tissue around the wound orifice but this still has roughly the same depth. Been using silver alginate. We applied Oasis #1 today 07/23/2018; still having to remove a lot of callus and thick subcutaneous tissue to actually define the wound here. Most of this seems to have closed down yet he has a comma shaped  divot over the top of the area that still I think is open. We applied Oasis #2 His wife expressed concern about the tip of his left great toe. This almost looks like a small blister. She also showed it today to her podiatrist Dr. Geroge Baseman who did not think this was anything serious. I am not sure is anything serious either however I think it bears some watching. He is not in any pain however he is insensate 07/30/2018;; still on a lot of nonviable tissue over the surface of the wound however cleaning this up reveals a more substantial  wound orifice but less of a probing wound depth. This is not probed to bone. There is no evidence of infection The wife is still concerned about a non-open area on the tip of his left great toe. Almost feels like a bony outgrowth. She had previously showed this to podiatry. I do not think this is a blister. A friction area would be possible although he is really not walking according to his wife. He had a small skin tag on his right buttock but no open wound here either 08/06/2018; we applied a third Oasis last week. Unfortunately there is really no improvement. Still requiring extensive debridement to expose the wound bed from a horizontal slitlike depression. I still have not been able to get this to fill in properly. On the positive side there is now no probable bone from when he first came into the facility. I changed him to silver alginate today after a reasonably aggressive debridement 08/13/2018; once again the patient comes in with skin and subcutaneous tissue closing over the small probing area with the underlying cavity of the wound on the right TMA site. I applied silver alginate to this last week. Prior to that we used Oasis x3 still not able to get this area granulating. He does not have a probing area of the bone which is an improvement from when I spur started working on this and an MRI did not suggest osteomyelitis. I do not see evidence of infection  here but I am increasingly concerned about why I cannot get this area to granulate. Each time I debrided this is looks like this is simply a matter of getting granulation to fill in the hole and then getting epithelialization. This does not seem to happen Also I sent him back to see podiatry Dr. Earleen Newport about the what felt to be bony outgrowth on the tip of his left great toe. Apparently after heel he left the clinic last week or the next day he developed a blood blister. He did see Dr. Earleen Newport. He went on to have a debridement of the medial nail cuticle he now has an open area here as well as some denuded skin. They did an x-ray apparently does have a bony outgrowth or spur but I am not able to look at this. They are using topical antibiotics apparently there was some suggested he use Santyl. Patient's wife was anxious for my opinion of this 08/20/2018; we are able to keep the wound open this time instead of the thick subcutaneous tissue closing over the top of it however unfortunately once again this probes to bone. I do not see any evidence of infection and previous MRI did not show osteomyelitis. I elected to go back to the Oasis to see if we can stimulate some granulation. Last saw his vascular interventional cardiologist Dr. Andree Elk at the beginning of August and he had a repeat procedure. Nevertheless I wonder how much blood flow he has down to this area With regards to the left first toe he is seeing Dr. Earleen Newport next week. They are applying Bactroban to this area. 08/27/2018 ooOnce again he comes in with thick eschar and subcutaneous tissue over the top of the small probing hole. This does not appear to go down to bone but it still has roughly the same depth. I reapplied Oasis today ooOver the left great toe there appears to be more of the wound at the tip of his toe than there was last week. This has an eschar on  the surface of it. Will change to Santyl. There is seeing podiatry this  afternoon 09/03/2018 ooHe comes in today with the area on the transmetatarsal's site with a fair amount of callus, nonviable tissue over the circumference but it was not closed. I removed all of this as well as some subcutaneous debris and reapplied Oasis. There is no exposed bone ooThe area over the tip of the left great toe started off as a nodule of uncertain etiology. He has been followed with podiatry. They have been removing part of the medial nail bed. He has nonviable tissue over the wound he been using Santyl in this area 09/10/18 ooUnfortunately comes in with neither wound area looking improved. The transmetatarsal amputation site once Again has nonviable debris over the surface requiring debridement. Unfortunately underneath this there is nothing that looks viable and this once again goes right down to bone. There is no purulent drainage and no erythema. ooAlso the surgical wound from podiatry on the left first toe has an ischemic-looking eschar over the surface of the tip of the toe I have elected not to attempt candidly debride this ooHis wife as arranged for him to have follow-up noninvasive studies in [place] under the care of Dr. Andree Elk clinic. The question is he has known severe PAD. They had recently seen him in August. He has had revascularizations in both legs within the last 4 or 5 months. He is not complaining of pain and I cannot really get a history of claudication. He has not systemically unwell 09/20/2018 the patient has been to [facility] and been revascularized by Dr. Andree Elk earlier this week. Apparently he was able to open up the anterior tibial artery although I have not actually seen his formal report. He is going for an attempt to revascularize on the left on Monday. He is apparently working with an investigational stent for lower extremity arteries below the knee and he has talked to the patient about placing that on Monday if possible. The patient has been  using silver alginate on the transmetatarsal amputation site and Santyl on the left 09/30/2018; patient had his revascularization on the left this apparently included a standard approach as well as a more distal arterial catheterization although I do not have any information on this from Dr. Andree Elk. In fact I do not even see the initial revascularization that he had on the right. I have included the arterial history from Dr. Andree Elk last note however below; ASSESSMENT/PLAN: 1. Hx of Critical limb ischemia bilateral lower extremities, PAD: -s/p transmetatarsal amputation of the RLE. -s/p right ATA percutaneous revascularization procedure x2, most recently 11/20/2016 s/p PTA of right AT 100% to less than 20% with a 2.5 x 200 balloon. 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 -03/21/2018 s/p PTA of 90% left popliteal artery to <10% with a 5x20 cutting balloon, PTA of 90% left peroneal to <20% with a 3x20 balloon, PTA of 100% left AT to <20% with a 2.5x220 balloon. 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 Will plan on the LLE soon after. Risks/benefits of procedure have been discussed and patient has elected to proceed. We have been using endoform to the right TMA amputation site wound and Santyl to the left great toe 10/07/2018;  the area on the tip of his left great toe looked better we have been using Santyl here. We continue to have a very difficult probing hole on the right TMA amputation site we have been using endoform. I went on to use his fifth Oasis today 10/14/2018; the tip of the left great toe continues to look better. We have been using Santyl here the surface however is healthy and I think we can change to an  alginate. The right TMA has not changed. Once again he has no superficial opening there is callus and thick subcutaneous tissue over the orifice once you remove this there is the probing area that we have been dealing with without too much change. There is no palpable bone I have been placing Oasis here and put Oasis #6 in this today after a more vigorous debridement 10/21/18; the left great toe still has necrotic surface requiring debridement. The right TMA site hasn't changed in view of the thick callus over the wound bed. With removal of this there is still the opening however this does not appear to have the same depth. Again there is no palpable bone. Oasis was replaced 10/28/2018; patient comes in with both wounds looking worse. The area over the first toe tip is now down to bone. The area over the TMA site is deeper and down to bone clearly with a increase in overall wound area. Equally concerning on the right TMA is the complete absence of a pulse this week which is a change. We are not even able to Doppler this. On the right he has a noncompressible ABI greater than 1.4 11/04/2018. Both wounds look somewhat worse. X-rays showed no osteomyelitis of the right foot but on the left there was underlying osteomyelitis in the left great toe distal phalanx. I been on the phone to Dr. Andree Elk surface at [facility] in [place] and they are arranging for another angiogram on the right on Monday. I have him on doxycycline for the osteomyelitis in the left great toe for now. Infectious disease may be necessary 1/17; 2-week hiatus. Patient was admitted to hospital at [facility]. My understanding is he underwent an angioplasty of the right anterior tibial artery and had stents placed in the left anterior artery and the left tibial peroneal trunk. This was done by Dr. Andree Elk of interventional radiology. There is no major change in either 1 of the wounds. They have been using Aquacel Ag. As far as they are aware no  imaging studies were done of the foot which is indeed unfortunate. I had him on doxycycline for 2 weeks since we identified the osteomyelitis in the left great toe by plain x-ray. I have renewed that again today. I am still suspicious about osteomyelitis in the amputation site and would consider doing another MRI to compare with the one done in September. The idea of hyperbaric oxygen certainly comes up for discussion 1/24; no major change in either wound area. I have him on doxycycline for osteomyelitis at the tip of the left great toe. As noted he has been previously and recently revascularized by Dr. Andree Elk at [place]. He is tolerating the doxycycline well. For some reason we do not have an infectious disease consult yet. Culture of drainage from the right foot site last week was negative 1/31; MRI of the right foot did not show osteomyelitis of the right ankle and foot. Notable for a skin ulceration overlying the second metatarsal stump with generalizing soft tissue edema of the ankle and  foot consistent with cellulitis. Noted to have a partial-thickness tear of the Achilles tendon 7.5 cm proximal to the insertion Nothing really new in terms of symptoms. Patient's area on the tip of the left great toe is just about closed although he still has the probing area on the metatarsal amputation site. Appointment with Dr. Linus Salmons of infectious disease next week 2/7; Dr. Novella Olive did not feel that any further antibiotics were necessary he would follow-up in 2 months. The left great toe appears to be closed still some surface callus that I gently looked under high did not see anything open or anything that was threatening to be open. He still has the open area on the mid part of his TMA site using endoform 2/14; left great toe is closed and he is completing his doxycycline as of last Sunday. He is not on any antibiotics. Unfortunately out of the right foot his wife noticed some subdermal hemorrhage this week.  He had been walking 2 miles I had given him permission to do so this is not really a plantar wound. Using endoform to this wound but I changed to silver alginate this week 2/21; left great toe remains closed. Culture last week grew Streptococcus angiosis which I am not really familiar with however it is penicillin sensitive and I am going to put him on Augmentin. Not much change in the wound on the right foot the deep area is still probing precariously close to bone and the wound on the margin of the TMA is larger. 2/28; left great toe remains closed. He is completing the Augmentin I gave him last week. 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 This is being shown to Dr. Andree Elk at [place] to see if there is anything else that can be done here. He has been using silver alginate strips on the right 3/6; left great toe remains closed. He sees Dr. Andree Elk on Monday. The area on the plantar aspect of the right foot has the same small orifice with thick callused tissue around this. However this time with removal of the callus tissue the wound is open all the way along the incision line to the end medially. We have been using silver alginate. 3/13; left toe remains closed although the area is callused. He sees Dr. Jacqualyn Posey of podiatry next week. The area on the plantar right foot looked a lot better this week. Culture I did of this was negative we use silver alginate. He is going next week for an attempt at revascularization by Dr. Andree Elk 3/23; left toe remains closed although the area is callused. He will see Dr. Jacqualyn Posey in follow-up. The area on the right plantar foot continues to look surprisingly better over the last 3 visits. We have been using silver alginate. The revascularization he was supposed to have by Dr. Andree Elk at [facility] in [place] has been canceled [facility] procedure] 4/6; the right foot remains closed albeit callused. Podiatry canceled the appointment  with regards to the left great toe. He has not seen Dr. Andree Elk at [facility] but thinks that Dr. Andree Elk has "done all he can do". He would be a candidate for the hemostaemix trial Readmission 07/29/2019 Mr. Stann Mainland is a man we know well from at least 3 previous stays in this clinic. He is a type II diabetic with severe PAD followed by Dr. Andree Elk at [facility] in [place]. During his last stay here he had a probing wound bone in his right TMA  site and a episode of osteomyelitis on the tip of the left great toe at a surgical site. So far everything in both of these areas has remained closed. About 2 weeks ago he went to see his podiatrist at [facility] Dr. Babs Bertin. He had a thick callus on the left fifth metatarsal head that was shaved. He has developed an open wound in this area. They have been offloading this in his diabetic shoes. The patient has not had any more revascularizations by Dr. Andree Elk since the last time he was here. ABI in our clinic at the posterior tibial on the left was 1.06 10/6; no real change in the area on the plantar met head. Small wound with 2 mm of depth. He has thick skin probably from pressure around the wound. We have been using silver alginate 10/13; small wound in the left fifth plantar met head. Arrives today with undermining laterally and purulent drainage. Our intake nurse cultured this. His wife stated they noticed a change in color over the last day or 2. He is not systemically unwell 10/19; small wound on the fifth plantar metatarsal head. Culture I did last week showed Staphylococcus lugdunensis. Although this could be a skin contaminant the possibility of a skin and soft tissue infection was there. I did give him empiric doxycycline which should have covered this. We are using silver alginate to the wound. We put him in a total contact cast today. 10/22; small wound on the fifth plantar metatarsal head. He has completed antibiotics. He also has severe  PAD which worries me about just about any wound on this man's foot 10/29; small superficial area on the fifth plantar metatarsal head. Measuring slightly smaller. He is not currently on any antibiotics. I been using a total contact cast in this man with very severe PAD but he seems to be tolerating this well 11/5; left plantar fifth metatarsal head. Silver alginate being used under a total contact cast 11/12; wound not much different than last week. Using a #15 scalpel debridement around the wound. Still silver collagen under a total contact cast. He has severe PAD and that may be playing a role in this 11/19; disappointing that the wound is not really changed that much. I think it is come down in overall surface area because originally this was on the lateral part of the fifth metatarsal head however recently it is not really changed. Most of this is filled in but it will not epithelialized there is surface debris on this which may be mostly related to ischemia. They have an appointment with Dr. Andree Elk on 12/9 09/24/2019 on evaluation today patient appears to be doing somewhat better with regard to the wound on the left fifth metatarsal head. Fortunately there does not appear to be any signs of active infection at this time. No fevers, chills, nausea, vomiting, or diarrhea. The wound does not appear to be completely closed and I do feel like the Hydrofera Blue is helping to some degree although I feel like the cast as well as keeping things from breaking down or getting any larger at least. As far as his wound overview it shows that the overall size of the wound is slightly smaller but really maintaining within the realm of about the same. He had no troubles with the cast which is good news. Patient History Information obtained from Patient. Family History Cancer - Mother, Heart Disease - Paternal Grandparents, Stroke - Maternal Grandparents, No family history of Diabetes, Hereditary  Spherocytosis, Hypertension, Kidney Disease, Lung Disease, Seizures, Thyroid Problems, Tuberculosis. Social History Never smoker, Marital Status - Married, Alcohol Use - Rarely, Drug Use - No History, Caffeine Use - Daily. Medical History Eyes Patient has history of Cataracts Denies history of Glaucoma, Optic Neuritis Ear/Nose/Mouth/Throat Patient has history of Chronic sinus problems/congestion - allergies Denies history of Middle ear problems Hematologic/Lymphatic Denies history of Anemia, Hemophilia, Human Immunodeficiency Virus, Lymphedema, Sickle Cell Disease Respiratory Denies history of Aspiration, Asthma, Chronic Obstructive Pulmonary Disease (COPD), Pneumothorax, Sleep Apnea, Tuberculosis Cardiovascular Patient has history of Coronary Artery Disease - 2 vessel bypass 2008, Hypertension, Peripheral Arterial Disease Denies history of Angina, Arrhythmia, Congestive Heart Failure, Deep Vein Thrombosis, Hypotension, Myocardial Infarction, Peripheral Venous Disease, Phlebitis, Vasculitis Gastrointestinal Denies history of Cirrhosis , Colitis, Crohnoos, Hepatitis A, Hepatitis B, Hepatitis C Endocrine Patient has history of Type II Diabetes Denies history of Type I Diabetes Genitourinary Denies history of End Stage Renal Disease Immunological Denies history of Lupus Erythematosus, Raynaudoos, Scleroderma Integumentary (Skin) Denies history of History of Burn Musculoskeletal Patient has history of Osteoarthritis Denies history of Gout, Rheumatoid Arthritis, Osteomyelitis Neurologic Patient has history of Neuropathy Denies history of Dementia, Quadriplegia, Paraplegia, Seizure Disorder Oncologic Denies history of Received Chemotherapy, Received Radiation Psychiatric Denies history of Anorexia/bulimia, Confinement Anxiety Hospitalization/Surgery History - coronary artery bypass graft. - (R) transmet amputation. - arteriogram. - revascularization BLE. Medical And Surgical  History Notes Genitourinary h/o kidney stones Review of Systems (ROS) Constitutional Symptoms (General Health) Denies complaints or symptoms of Fatigue, Fever, Chills, Marked Weight Change. Respiratory Denies complaints or symptoms of Chronic or frequent coughs, Shortness of Breath. Cardiovascular Denies complaints or symptoms of Chest pain. Psychiatric Denies complaints or symptoms of Claustrophobia, Suicidal. Objective Constitutional Well-nourished and well-hydrated in no acute distress. Vitals Time Taken: 10:28 AM, Height: 69 in, Weight: 210 lbs, BMI: 31, Temperature: 98.6 F, Pulse: 75 bpm, Respiratory Rate: 18 breaths/min, Blood Pressure: 137/82 mmHg. Respiratory normal breathing without difficulty. Psychiatric this patient is able to make decisions and demonstrates good insight into disease process. Alert and Oriented x 3. pleasant and cooperative. General Notes: Patient's wound bed currently did not require any sharp debridement none was performed at this point and overall he seems to be doing quite well based on what I am seeing. He does have somewhat poor vascular flow he sees his vascular doctor on December 9. Other than that overall he seems to feel like things are maintaining but really not getting much better or worse. He tolerated the cast fairly well. For that reason we did go ahead and reapply the total contact cast here today in the office which the patient tolerated without complication. I applied that myself. We will stick with the Hydrofera Blue dressing for now. Integumentary (Hair, Skin) Wound #5 status is Open. Original cause of wound was Gradually Appeared. The wound is located on the Left Metatarsal head fifth. The wound measures 0.5cm length x 0.6cm width x 0.2cm depth; 0.236cm^2 area and 0.047cm^3 volume. There is Fat Layer (Subcutaneous Tissue) Exposed exposed. There is no tunneling or undermining noted. There is a small amount of serosanguineous drainage  noted. The wound margin is thickened. There is large (67-100%) pink granulation within the wound bed. There is a small (1-33%) amount of necrotic tissue within the wound bed including Adherent Slough. Assessment Active Problems ICD-10 Type 2 diabetes mellitus with foot ulcer Type 2 diabetes mellitus with diabetic peripheral angiopathy without gangrene Non-pressure chronic ulcer of other part of left foot limited to breakdown of  skin Cellulitis of left lower limb Procedures Wound #5 Pre-procedure diagnosis of Wound #5 is a Diabetic Wound/Ulcer of the Lower Extremity located on the Left Metatarsal head fifth . There was a Total Contact Cast Procedure by Worthy Keeler, PA. Post procedure Diagnosis Wound #5: Same as Pre-Procedure Plan Follow-up Appointments: Return Appointment in 1 week. Dressing Change Frequency: Wound #5 Left Metatarsal head fifth: Do not change entire dressing for one week. Wound Cleansing: Wound #5 Left Metatarsal head fifth: May shower with protection. Primary Wound Dressing: Wound #5 Left Metatarsal head fifth: Hydrofera Blue Secondary Dressing: Wound #5 Left Metatarsal head fifth: Foam - foam donut pad foot. Dry Gauze Off-Loading: Total Contact Cast to Left Lower Extremity 1. I would recommend we continue with [facility] which seems to be doing well the patient is in agreement with that plan. 2. I am also going to suggest we reapply the total contact cast today I did perform that application myself and post application the patient seems to be doing well. We will see patient back for reevaluation in 1 week here in the clinic. If anything worsens or changes patient will contact our office for additional recommendations. Electronic Signature(s) Signed: 09/24/2019 4:52:33 PM By: Worthy Keeler PA-C Entered By: Worthy Keeler on 09/24/2019 11:47:47 -------------------------------------------------------------------------------- HxROS Details Patient  Name: Date of Service: [name], [name] 09/24/2019 9:30 AM Medical Record [id number] Patient Account Number: [id number] Date of Birth/Sex: Treating RN: 1946/03/10 (73 y.o. M) Primary Care Provider: [name] Other Clinician: Referring Provider: Treating Provider/Extender:Stone III, Zadie Rhine, [name] in Treatment: 8 Information Obtained From Patient Constitutional Symptoms (General Health) Complaints and Symptoms: Negative for: Fatigue; Fever; Chills; Marked Weight Change Respiratory Complaints and Symptoms: Negative for: Chronic or frequent coughs; Shortness of Breath Medical History: Negative for: Aspiration; Asthma; Chronic Obstructive Pulmonary Disease (COPD); Pneumothorax; Sleep Apnea; Tuberculosis Cardiovascular Complaints and Symptoms: Negative for: Chest pain Medical History: Positive for: Coronary Artery Disease - 2 vessel bypass 2008; Hypertension; Peripheral Arterial Disease Negative for: Angina; Arrhythmia; Congestive Heart Failure; Deep Vein Thrombosis; Hypotension; Myocardial Infarction; Peripheral Venous Disease; Phlebitis; Vasculitis Psychiatric Complaints and Symptoms: Negative for: Claustrophobia; Suicidal Medical History: Negative for: Anorexia/bulimia; Confinement Anxiety Eyes Medical History: Positive for: Cataracts Negative for: Glaucoma; Optic Neuritis Ear/Nose/Mouth/Throat Medical History: Positive for: Chronic sinus problems/congestion - allergies Negative for: Middle ear problems Hematologic/Lymphatic Medical History: Negative for: Anemia; Hemophilia; Human Immunodeficiency Virus; Lymphedema; Sickle Cell Disease Gastrointestinal Medical History: Negative for: Cirrhosis ; Colitis; Crohns; Hepatitis A; Hepatitis B; Hepatitis C Endocrine Medical History: Positive for: Type II Diabetes Negative for: Type I Diabetes Time with diabetes: 1990 Treated with: Insulin, Oral agents Blood sugar tested every day: Yes Tested : 1-3 per  day Blood sugar testing results: Breakfast: 148; Lunch: 83; Dinner: 109 Genitourinary Medical History: Negative for: End Stage Renal Disease Past Medical History Notes: h/o kidney stones Immunological Medical History: Negative for: Lupus Erythematosus; Raynauds; Scleroderma Integumentary (Skin) Medical History: Negative for: History of Burn Musculoskeletal Medical History: Positive for: Osteoarthritis Negative for: Gout; Rheumatoid Arthritis; Osteomyelitis Neurologic Medical History: Positive for: Neuropathy Negative for: Dementia; Quadriplegia; Paraplegia; Seizure Disorder Oncologic Medical History: Negative for: Received Chemotherapy; Received Radiation HBO Extended History Items Ear/Nose/Mouth/Throat: Eyes: Chronic sinus Cataracts problems/congestion Immunizations Pneumococcal Vaccine: Received Pneumococcal Vaccination: No Implantable Devices Yes Hospitalization / Surgery History Type of Hospitalization/Surgery coronary artery bypass graft (R) transmet amputation arteriogram revascularization BLE Family and Social History Cancer: Yes - Mother; Diabetes: No; Heart Disease: Yes - Paternal Grandparents; Hereditary Spherocytosis: No; Hypertension: No; Kidney  Disease: No; Lung Disease: No; Seizures: No; Stroke: Yes - Maternal Grandparents; Thyroid Problems: No; Tuberculosis: No; Never smoker; Marital Status - Married; Alcohol Use: Rarely; Drug Use: No History; Caffeine Use: Daily; Financial Concerns: No; Food, Clothing or Shelter Needs: No; Support System Lacking: No; Transportation Concerns: No Physician Affirmation I have reviewed and agree with the above information. Electronic Signature(s) Signed: 09/24/2019 4:52:33 PM By: Worthy Keeler PA-C Entered By: Worthy Keeler on 09/24/2019 11:46:43 -------------------------------------------------------------------------------- Total Contact Cast Details Patient Name: Date of Service: [name], [name] 09/24/2019 9:30  AM Medical Record [id number] Patient Account Number: [id number] Date of Birth/Sex: 1946/10/30 (73 y.o. M) Treating RN: Baruch Gouty Primary Care Provider: [name] Other Clinician: Referring Provider: Treating Provider/Extender:Stone III, Zadie Rhine, [name] in Treatment: 8 Total Contact Cast Applied for Wound Assessment: Wound #5 Left Metatarsal head fifth Performed By: Physician Worthy Keeler, PA Post Procedure Diagnosis Same as Pre-procedure Electronic Signature(s) Signed: 09/24/2019 3:14:28 PM By: Baruch Gouty RN, BSN Signed: 09/24/2019 4:52:33 PM By: Worthy Keeler PA-C Entered By: Baruch Gouty on 09/24/2019 10:55:23 -------------------------------------------------------------------------------- SuperBill Details Patient Name: Date of Service: [name], [name] 09/24/2019 Medical Record [id number] Patient Account Number: [id number] Date of Birth/Sex: Treating RN: 1946/02/08 (73 y.o. [name] Primary Care Provider: [name] Other Clinician: Referring Provider: Treating Provider/Extender:Stone III, Zadie Rhine, [name] in Treatment: 8 Diagnosis Coding ICD-10 Codes Code Description E11.621 Type 2 diabetes mellitus with foot ulcer E11.51 Type 2 diabetes mellitus with diabetic peripheral angiopathy without gangrene L97.521 Non-pressure chronic ulcer of other part of left foot limited to breakdown of skin L03.116 Cellulitis of left lower limb Facility Procedures CPT4 Code Description: [id number] - APPLY TOTAL CONTACT LEG CAST ICD-10 Diagnosis Description L97.521 Non-pressure chronic ulcer of other part of left foot limite Modifier: d to breakdo Quantity: 1 wn of skin Physician Procedures CPT4 Code Description: [id number] - WC PHYS APPLY TOTAL CONTACT CAST ICD-10 Diagnosis Description L97.521 Non-pressure chronic ulcer of other part of left foot limited Modifier: to breakdow Quantity: 1 n of skin Electronic  Signature(s) Signed: 09/24/2019 4:52:33 PM By: Worthy Keeler PA-C Entered By: Worthy Keeler on 09/24/2019 11:48:13

## 2019-09-30 ENCOUNTER — Encounter (HOSPITAL_BASED_OUTPATIENT_CLINIC_OR_DEPARTMENT_OTHER): Payer: Medicare Other | Attending: Internal Medicine | Admitting: Internal Medicine

## 2019-09-30 ENCOUNTER — Other Ambulatory Visit: Payer: Self-pay

## 2019-09-30 DIAGNOSIS — L03116 Cellulitis of left lower limb: Secondary | ICD-10-CM | POA: Diagnosis not present

## 2019-09-30 DIAGNOSIS — I10 Essential (primary) hypertension: Secondary | ICD-10-CM | POA: Diagnosis not present

## 2019-09-30 DIAGNOSIS — E1151 Type 2 diabetes mellitus with diabetic peripheral angiopathy without gangrene: Secondary | ICD-10-CM | POA: Diagnosis not present

## 2019-09-30 DIAGNOSIS — E11621 Type 2 diabetes mellitus with foot ulcer: Secondary | ICD-10-CM | POA: Insufficient documentation

## 2019-09-30 DIAGNOSIS — L97521 Non-pressure chronic ulcer of other part of left foot limited to breakdown of skin: Secondary | ICD-10-CM | POA: Insufficient documentation

## 2019-09-30 DIAGNOSIS — M1990 Unspecified osteoarthritis, unspecified site: Secondary | ICD-10-CM | POA: Diagnosis not present

## 2019-09-30 DIAGNOSIS — Z8614 Personal history of Methicillin resistant Staphylococcus aureus infection: Secondary | ICD-10-CM | POA: Diagnosis not present

## 2019-09-30 DIAGNOSIS — Z794 Long term (current) use of insulin: Secondary | ICD-10-CM | POA: Diagnosis not present

## 2019-10-07 ENCOUNTER — Encounter (HOSPITAL_BASED_OUTPATIENT_CLINIC_OR_DEPARTMENT_OTHER): Payer: Medicare Other | Admitting: Internal Medicine

## 2019-10-07 ENCOUNTER — Other Ambulatory Visit: Payer: Self-pay

## 2019-10-07 DIAGNOSIS — E11621 Type 2 diabetes mellitus with foot ulcer: Secondary | ICD-10-CM | POA: Diagnosis not present

## 2019-10-07 NOTE — Progress Notes (Signed)
RYLER, LASKOWSKI ([id number]) Visit Report for 09/30/2019 HPI Details Patient Name: Date of Service: [name], [name] 09/30/2019 1:15 PM Medical Record [id number] Patient Account Number: [id number] Date of Birth/Sex: Treating RN: 1946-07-14 (73 y.o. M) Primary Care Provider: [name] Other Clinician: Referring Provider: Treating Provider/Extender:Karley Pho, [name], [name] in Treatment: 9 History of Present Illness HPI Description: 05/18/16; this is a 73year-old diabetic who is a type II diabetic on insulin. The history is that he traumatized his right foot developed a sore sometime in late March. Shortly thereafter he went on a cruise but he had to get off the cruise ship in [place] and fly urgently back to [place] where he was admitted to [facility] and ultimately underwent a transmetatarsal amputation by Dr. 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 According to the patient and his wife this wound never really healed. He was seen on 2 occasions in the wound care center in [place] and had vascular studies and then was referred urgently to Dr. Bridgett Larsson of vascular surgery. He underwent an angiogram on 05/10/16. Unfortunately nothing really could be done to improve his vascular status. He had a 75-90% stenosis in the midsegment of 1 segment of the posterior femoral artery. He had a patent popliteal, his anterior tibial occluded shortly after takeoff. Perineal had a greater than 90% stenosis posterior tibial is occluded feet had no distal collaterals feed distal aspect of the transmetatarsal amputation site. The patient tells me that he had a prolonged period of Santyl by Dr. Doran Durand was some initial improvement but then this was stopped. I think they're only applying daily dressings/dry dressings. He has not had a recent x-ray of the right foot he did have one before his surgery in April. His wife by the dimensions of the wound/surgical site being followed at home since  4/20. At that point the dimensions were 0.5 x 12 x 0.2 on 7/19 this was 1.8 x 6 x 0.4. He is not currently on any antibiotics. His hemoglobin A1c in early April was 12.9 at that point he was started on insulin. Apparently his blood sugars are much lower he has an appointment with Dr. Legrand Como Alteimer of endocrine next week. 05/29/16 x-ray of the area did not show osteomyelitis. I think he probably needs an MRI at this point. His wife is asking about something called"Yireh" cream which is not FDA approved. I have not heard of this. 06/22/16; MRI did not really suggest osteomyelitis. There was minimal marrow edema and enhancement in the stump of the second metatarsal felt to be secondary likely to postoperative change rather than osteomyelitis. The patient has arranged his own consultation with Dr. Andree Elk at [place], apparently their daughter lives in [place] and has some connection here. Any improvement in vascular supply by Dr. Andree Elk would of course be helpful. Dr. Bridgett Larsson did not feel that anything further could be done other than amputation if wound care did not result in healing or if the area deteriorates. 06/26/16; the patient has been to see Dr. Andree Elk at [place] and had an angiogram. He is going for a procedure on Thursday which will involve catheterization. I'm not sure if this is an anterograde or retrograde approach. He has been using Santyl to the wound 07/10/16; the patient had a repeat angiogram and angioplasty at [place] by Dr. Brunetta Jeans. His angiogram showed right CFA and profundal widely patent. The right as of a.m. popliteal artery were widely patent the right anterior tibial was occluded proximally and reconstitutes at the ankle.  Peroneal artery was patent to the foot. Posterior tibial artery was occluded. The patient had angioplasty of the anterior tibial artery.. This was quite successful. He was recommended for Plavix as well as aspirin. 07/17/16; the patient was close to be a nurse visit today  however the outer dressing of the Apligraf fell off. Noted drainage. I was asked to see the wound. The patient is noted an odor however his wife had noted that. Drainage with Apligraf not necessarily a bad thing. He has not been systemically unwell 07/24/16; we are still have an issue with drainage of this wound. In spite of this I applied his second Apligraf. Medially the area still is probing to bone. 08/07/16; Apligraf reapplied in general wound looks improved. 08/21/16 Apligraf #4. Wound looks much better 09/04/16 patientt's wound again today continues to appear to improve with the application of the Apligraf's. He notes no increased discomfort or concerns at this point in time. 09/18/16; the patient returns today 2 weeks after his fifth application of Apligraf. Predictably three quarters of the width of this wound has healed. The deep area that probe to bone medially is still open. The patient asked how much out-of-pocket dollars would be for additional Apligraf's. 09/25/16; now using Hydrofera Blue. He has completed 5 Apligraf applications with considerable improvement in this deep open transmetatarsal amputation site. His wound is now a triangular-shaped wound on the medial aspect. At roughly 12 to 2:00 this probes another centimeter but as opposed to in the past this does not probe to bone. The patient has been seen at [facility] by Dr. Zenia Resides. He is not planning to do any more revascularization unless the wound stalls or worsens per the patient 10/02/16; 0.7 x 0.8 x 0.8. Unfortunately although the wound looks stable to improved. There is now easily probable bone. This hasn't been present for several weeks. Patient is not otherwise symptomatic he is not experiencing any pain. I did a culture of the wound bed 10/09/16. Deterioration last week. Culture grew MRSA and although there is improvement here with doxycycline prescribed over the phone I'm going to try to get him linezolid 600 twice a day for  10 days today. 10/16/16; he is completing a weeks worth of linezolid and still has 3 more days to go. Small triangular-shaped open area with some degree of undermining. There is still palpable bone with a curet. Overall the area appears better than last week 10/20/16 he has completed the linezolid still having some nausea and vomiting but no diarrhea. He has exposed bone this week which is a deterioration. 10/27/16 patient now has a small but probing wound down to bone. Culture of this bone that I did last week showed a few methicillin-resistant staph aureus. I have little doubt that this represents acute/subacute osteomyelitis. The patient is currently on Doxy which I will continue he also completed 10 days of linezolid. We are now in a difficult situation with this patient's foot after considerable discussion we will send him back to see Dr. Doran Durand for a surgical opinion of this I'm also going to try to arrange a infectious disease consult at [facility] hopefully week and get this prior to her usual 4-6 weeks we having [place]. The patient clearly is going to need 6 weeks of IV vancomycin. If we cannot arrange this expediently I'll have to consider ordering this myself through a home infusion company 11/03/16; the patient now has a small in terms of circumference but probing wound. No bone palpable today. The patient remains  on doxycycline 100 twice a day which should support him until he sees infectious disease at [facility] next week the following week on Wednesday I believe he has an appointment with Dr. Andree Elk at [facility] who is his vascular cardiologist. Finally he has an appointment with Dr. Doran Durand on 11/22/16 we have been using silver alginate. The patient's wife states they are having trouble getting this through [facility] 11/13/16; the patient was seen by infectious disease at [facility] in the 11th PICC line placed in preparation for IV antibiotics. A tummy he has not going to get IV vancomycin o  Ceftaroline. They've also ordered an MRI. Patient has a follow-up with Dr. Doran Durand on 11/22/16 and Dr. Andree Elk at [facility] tomorrow 11/23/16 the patient is on daptomycin as directed by infectious disease at [facility]. He is also been back to see Dr. Andree Elk at [facility]. He underwent a repeat arteriogram. He had a successful PTA of the right anterior tibial artery. He is on dual antiplatelete treatment with Plavix and aspirin. Finally he had the MRI of his foot in [place]. This showed cellulitis about the foot worse distally edema and enhancement in the reminiscent of the second metatarsal was consistent with osteomyelitis therefore what I was assuming to be the first metatarsal may be actually the second. He also has a fluid collection deep to the calcaneus at the level of the calcaneal spur which could be an abscess or due to adventitial bursitis. He had a small tear in his Achilles 11/30/16; the patient continues on daptomycin as directed by infectious disease at [facility]. He is been revascularized by Dr. Andree Elk at [place] in [place]. He has been to see Gretta Arab who was the orthopedic surgeon who did his original amputation. I have not seen his not however per the patient's wife he did not offer another surgical local surgical prodecure to remove involved bone. He verbalized his usual disbelief in not just hyperbarics but any medical therapy for this condition(osteomyelitis). I discussed this in detail with the patient today including answering the question about a BKA definitively "curing" the current condition. 12/07/16; the patient continues on daptomycin as directed by infectious disease at [facility]. This is directed at the MRSA that we cultured from his bone debridement from 12/22. Lab work today shows a white count of 8.7 hemoglobin of 10.5 which is microcytic and hypochromic differential count shows a slightly elevated monocyte count at 1.2 eosinophilic count of 0.6. His creatinine is  1.11 sedimentation rate apparently is gone from 35-34 now 40. I explained was wife I don't think this represents a trend. His total CK is 48 12/14/16- patient is here for follow-up evaluation of his right TMA site. He continues to receive IV daptomycin per infectious disease. His serum inflammatory markers remain elevated. He complains of intermittent pain to the medial aspect of the TMA site with intermittent erythema. He voices no complaints or concerns regarding hyperbaric therapy. Overall he and his wife are expressing a frustration and discouragement regarrding the length of time of treatment. 12/21/16; small open wound at roughly the first or second metatarsal metatarsalphalyngeal joint reminiscence of his transmetatarsal amputation site he continues to receive IV daptomycin per infectious disease at [facility]. He will finish these a week tomorrow. He has lab work which I been copied on. His white count is 10.8 hemoglobin 8.9 MCV is low at 75., MCH low at 24.5 platelet count slightly elevated at 626. Differential count shows 70% neutrophils 10% monocytes and 10% eosinophils. His comprehensive metabolic  panel shows a slightly low sodium at 133 albumin low at 3.1 total CK is normal at 42 sedimentation rate is much higher at 82. He has had iron studies that show a serum iron of 15 and iron binding capacity of 238 and iron saturation of 6. This is suggestive of iron deficiency. B12 and folate were normal ferritin at 113 The patient tells me that he is not eating well and he has lost weight. He feels episodically nauseated. He is coughing and gagging on mucus which she thinks is sinusitis. He has an appointment with his primary doctor at 5:00 this afternoon in [facility] 01/02/17; the patient developed a subacute pneumonitis. He was admitted to [facility] after a CT scan showed an extensive interstitial pneumonitis [I have not yet seen this]. 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 He has since been discharged. He is not on oxygen. He feels fatigued and very short of breath with exertion. At [facility] the wound care nurse there felt that his wound was healed. He did complete his daptomycin and has follow-up with infectious disease on Friday. X-rays I did before he went to [facility] still suggested residual osteomyelitis in the anterior aspect of the second must metatarsal head. I'm not sure I would've expected any different. There was no other findings. I actually think I did this because of erythema over the first metatarsal head reminiscent 01/11/17; the patient has eosinophilic pneumonitis. He has been reviewed by pulmonology and given clearance for hyperbarics at least that's what his wife says. I'll need to see if there is note in care everywhere. Apparently the prognosis for improvement of daptomycin induced eosinophilic granulocyte is is 3 months without steroids. In the meantime infectious disease has placed him on doxycycline until the wound is closed. He is still is lost a lot of weight and his blood sugars are running in the mid 60s to low 80s fasting and at all times during the day 01/18/17; he has had adjustments in his insulin apparently his blood sugars in the morning or over 100. He wants to restart his hyperbaric treatment we'll do this at 1:00. 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 I think there is good reason to complete his treatments in order to give him the best chance of maintaining a healed status and these DFU 3 wounds with MRSA infection in the bone 02/15/17; the patient was seen today in conjunction with HBO. He completed hyperbaric oxygen today. The open area on his transmetatarsal site has remained closed. There was an area of erythema when I saw him earlier in  the week on the posterior heel although that is resolved as of today as well. He has been using a cam walker. This is a patient who came to [place] after a transmetatarsal amputation that was necrotic and dehisced. He required revascularization percutaneously on 2 different occasions by Dr. Andree Elk of invasive cardiology at [facility]. 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 He went to [facility] infectious disease and had IV daptomycin for 5 weeks before developing eosinophilic pneumonitis and requiring an admission to hospital/ICU. He made a good recovery and is continued on doxycycline since. As mentioned his foot is closed now. He has a follow-up with Dr. Andree Elk tomorrow. He is going to [employer]  on Monday for a custom-made shoe READMISSION Last visit Dr. Andree Elk [facility] 02/16/17 1. Critical limb ischemia of the RLE: s/p transmetatarsal amputation of the RLE, now completely healed. s/p right ATA percutaneous revascularization procedure x2, most recently 11/20/2016 s/p PTA of right AT 100% to less than 20% with a 2.5 x 200 balloon. He does have some residual osteomyelitis, but given the wound is closed, the orthopedist has recommended to follow. He has a fitting for a special shoe coming up next week. He has completed a course of daptomycin and doxycycline and he has been discharged by ID. He has completed a course of hyperbaric therapy. Continue Continue medical management with aspirin, Plavix and statin therapy. We will discuss ongoing Plavix therapy at follow-up in 6 months. 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 Given no symptoms, we will conservatively manage. He doesn't want to do any more invasive studies at this time, which is reasonable. The patient arrives today  out of 2 concerns both on the right transmetatarsal site. 1 at the level of the reminiscent fifth metatarsal head and the other at roughly the first or second. Both of these look like dark subcutaneous discoloration probably subdermal bleeding. He is recently obtained new adaptive footwear for the right foot. He also has a callus on the left fifth dorsal toe however he follows with podiatry for this and I don't think this is any issue. ABIs in this clinic today were 0.66 on the right and 1.06 on the left. On entrance into our clinic initially this was 0.95 and 0.92. He does not describe current claudication. They're going away on a cruise in 8 weeks and I think are trying to do the month is much as they can proactively. They follow with podiatry and have an appointment with Dr. Andree Elk in October READMISSION 06/25/18 This is a patient that we have not seen in almost a year. He is a type II diabetic with known PAD. He is followed by Dr. Andree Elk of interventional cardiology at [facility] in [place]. He is required revascularization for significant PAD. When we first saw him he required a transmetatarsal amputation. He had underlying osteomyelitis with a nonhealing surgical wound. This eventually closed with wound care, IV antibiotics and hyperbaric oxygen. They tell me that he has a modified shoe and he is very active walking up to 4 miles a day. He is followed by Dr. Geroge Baseman of podiatry. His wife states that he underwent a removal of callus over this site on July 9. She felt there may be drainage from this site after that although she could never really determined and there was callus buildup again. On 06/01/18 there was pressure bleeding through the overlying callus. he was given a prescription for 7 days of Bactrim. Fortuitously he has an appointment with Dr. Andree Elk on 06/04/18 and he immediately underwent revascularization of the right leg although I have not had a chance to review these records  in care everywhere. This was apparently done through anterior and retrograde access. On 06/06/18 he had another debridement by Dr. Bernette Mayers. Vitamin soaking with Epsom salts for 20 minutes and applying calcium alginate. The original trans-met was on April 2017 I believe by Dr. Doran Durand. His ABI in our clinic was noncompressible today. 07/02/18; x-ray I ordered last week was negative for osteomyelitis. Swab culture was also negative. He is going to require an  MRI which I have ordered today. 07/09/18; surprisingly the MRI of the foot that I ordered did not show osteomyelitis. He did suggest the possibility of cellulitis. For this reason I'll go ahead and give him a 10 day course of doxycycline. Although the previous culture of this area was negative 07/16/18; it arrives with the wound looking much the same. Roughly the same depth. He has thick subcutaneous tissue around the wound orifice but this still has roughly the same depth. Been using silver alginate. We applied Oasis #1 today 07/23/2018; still having to remove a lot of callus and thick subcutaneous tissue to actually define the wound here. Most of this seems to have closed down yet he has a comma shaped divot over the top of the area that still I think is open. We applied Oasis #2 His wife expressed concern about the tip of his left great toe. This almost looks like a small blister. She also showed it today to her podiatrist Dr. Geroge Baseman who did not think this was anything serious. I am not sure is anything serious either however I think it bears some watching. He is not in any pain however he is insensate 07/30/2018;; still on a lot of nonviable tissue over the surface of the wound however cleaning this up reveals a more substantial wound orifice but less of a probing wound depth. This is not probed to bone. There is no evidence of infection The wife is still concerned about a non-open area on the tip of his left great toe. Almost feels like a bony  outgrowth. She had previously showed this to podiatry. I do not think this is a blister. A friction area would be possible although he is really not walking according to his wife. He had a small skin tag on his right buttock but no open wound here either 08/06/2018; we applied a third Oasis last week. Unfortunately there is really no improvement. Still requiring extensive debridement to expose the wound bed from a horizontal slitlike depression. I still have not been able to get this to fill in properly. On the positive side there is now no probable bone from when he first came into the facility. I changed him to silver alginate today after a reasonably aggressive debridement 08/13/2018; once again the patient comes in with skin and subcutaneous tissue closing over the small probing area with the underlying cavity of the wound on the right TMA site. I applied silver alginate to this last week. Prior to that we used Oasis x3 still not able to get this area granulating. He does not have a probing area of the bone which is an improvement from when I spur started working on this and an MRI did not suggest osteomyelitis. I do not see evidence of infection here but I am increasingly concerned about why I cannot get this area to granulate. Each time I debrided this is looks like this is simply a matter of getting granulation to fill in the hole and then getting epithelialization. This does not seem to happen Also I sent him back to see podiatry Dr. Earleen Newport about the what felt to be bony outgrowth on the tip of his left great toe. Apparently after heel he left the clinic last week or the next day he developed a blood blister. He did see Dr. Earleen Newport. He went on to have a debridement of the medial nail cuticle he now has an open area here as well as some denuded skin. They did an  x-ray apparently does have a bony outgrowth or spur but I am not able to look at this. They are using topical antibiotics apparently  there was some suggested he use Santyl. Patient's wife was anxious for my opinion of this 08/20/2018; we are able to keep the wound open this time instead of the thick subcutaneous tissue closing over the top of it however unfortunately once again this probes to bone. I do not see any evidence of infection and previous MRI did not show osteomyelitis. I elected to go back to the Oasis to see if we can stimulate some granulation. Last saw his vascular interventional cardiologist Dr. Andree Elk at the beginning of August and he had a repeat procedure. Nevertheless I wonder how much blood flow he has down to this area With regards to the left first toe he is seeing Dr. Earleen Newport next week. They are applying Bactroban to this area. 08/27/2018 Once again he comes in with thick eschar and subcutaneous tissue over the top of the small probing hole. This does not appear to go down to bone but it still has roughly the same depth. I reapplied Oasis today Over the left great toe there appears to be more of the wound at the tip of his toe than there was last week. This has an eschar on the surface of it. Will change to Santyl. There is seeing podiatry this afternoon 09/03/2018 He comes in today with the area on the transmetatarsal's site with a fair amount of callus, nonviable tissue over the circumference but it was not closed. I removed all of this as well as some subcutaneous debris and reapplied Oasis. There is no exposed bone The area over the tip of the left great toe started off as a nodule of uncertain etiology. He has been followed with podiatry. They have been removing part of the medial nail bed. He has nonviable tissue over the wound he been using Santyl in this area 09/10/18 Unfortunately comes in with neither wound area looking improved. The transmetatarsal amputation site once Again has nonviable debris over the surface requiring debridement. Unfortunately underneath this there is nothing that looks  viable and this once again goes right down to bone. There is no purulent drainage and no erythema. Also the surgical wound from podiatry on the left first toe has an ischemic-looking eschar over the surface of the tip of the toe I have elected not to attempt candidly debride this His wife as arranged for him to have follow-up noninvasive studies in [place] under the care of Dr. Andree Elk clinic. The question is he has known severe PAD. They had recently seen him in August. He has had revascularizations in both legs within the last 4 or 5 months. He is not complaining of pain and I cannot really get a history of claudication. He has not systemically unwell 09/20/2018 the patient has been to [facility] and been revascularized by Dr. Andree Elk earlier this week. Apparently he was able to open up the anterior tibial artery although I have not actually seen his formal report. He is going for an attempt to revascularize on the left on Monday. He is apparently working with an investigational stent for lower extremity arteries below the knee and he has talked to the patient about placing that on Monday if possible. The patient has been using silver alginate on the transmetatarsal amputation site and Santyl on the left 09/30/2018; patient had his revascularization on the left this apparently included a standard approach as well  as a more distal arterial catheterization although I do not have any information on this from Dr. Andree Elk. In fact I do not even see the initial revascularization that he had on the right. I have included the arterial history from Dr. Andree Elk last note however below; ASSESSMENT/PLAN: 1. Hx of Critical limb ischemia bilateral lower extremities, PAD: -s/p transmetatarsal amputation of the RLE. -s/p right ATA percutaneous revascularization procedure x2, most recently 11/20/2016 s/p PTA of right AT 100% to less than 20% with a 2.5 x 200 balloon. -On original angiogram 05/15/2016, he had a  significant 70-95% left popliteal artery stenosis with AT and PT artery occlusions and one vessel runoff via the peroneal artery. -03/21/2018 s/p PTA of 90% left popliteal artery to <10% with a 5x20 cutting balloon, PTA of 90% left peroneal to <20% with a 3x20 balloon, PTA of 100% left AT to <20% with a 2.5x220 balloon. 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 Will plan on the LLE soon after. Risks/benefits of procedure have been discussed and patient has elected to proceed. We have been using endoform to the right TMA amputation site wound and Santyl to the left great toe 10/07/2018; the area on the tip of his left great toe looked better we have been using Santyl here. We continue to have a very difficult probing hole on the right TMA amputation site we have been using endoform. I went on to use his fifth Oasis today 10/14/2018; the tip of the left great toe continues to look better. We have been using Santyl here the surface however is healthy and I think we can change to an alginate. The right TMA has not changed. Once again he has no superficial opening there is callus and thick subcutaneous tissue over the orifice once you remove this there is the probing area that we have been dealing with without too much change. There is no palpable bone I have been placing Oasis here and put Oasis #6 in this today after a more vigorous debridement 10/21/18; the left great toe still has necrotic surface requiring debridement. The right TMA site hasn't changed in view of the thick callus over the wound bed. With removal of this there is still the opening however this does not appear to have the same depth. Again there is no palpable bone. Oasis was replaced 10/28/2018; patient comes in with both wounds looking worse. The area over the first toe tip is now down  to bone. The area over the TMA site is deeper and down to bone clearly with a increase in overall wound area. Equally concerning on the right TMA is the complete absence of a pulse this week which is a change. We are not even able to Doppler this. On the right he has a noncompressible ABI greater than 1.4 11/04/2018. Both wounds look somewhat worse. X-rays showed no osteomyelitis of the right foot but on the left there was underlying osteomyelitis in the left great toe distal phalanx. I been on the phone to Dr. Andree Elk surface at [facility] in [place] and they are arranging for another angiogram on the right on Monday. I have him on doxycycline for the osteomyelitis in the left great toe for now. Infectious disease may be necessary 1/17; 2-week hiatus. Patient was admitted to hospital at [place]. My understanding is he underwent an angioplasty of the right  anterior tibial artery and had stents placed in the left anterior artery and the left tibial peroneal trunk. This was done by Dr. Andree Elk of interventional radiology. There is no major change in either 1 of the wounds. They have been using Aquacel Ag. As far as they are aware no imaging studies were done of the foot which is indeed unfortunate. I had him on doxycycline for 2 weeks since we identified the osteomyelitis in the left great toe by plain x-ray. I have renewed that again today. I am still suspicious about osteomyelitis in the amputation site and would consider doing another MRI to compare with the one done in September. The idea of hyperbaric oxygen certainly comes up for discussion 1/24; no major change in either wound area. I have him on doxycycline for osteomyelitis at the tip of the left great toe. As noted he has been previously and recently revascularized by Dr. Andree Elk at [place]. He is tolerating the doxycycline well. For some reason we do not have an infectious disease consult yet. Culture of drainage from the right foot site last week was  negative 1/31; MRI of the right foot did not show osteomyelitis of the right ankle and foot. Notable for a skin ulceration overlying the second metatarsal stump with generalizing soft tissue edema of the ankle and foot consistent with cellulitis. Noted to have a partial-thickness tear of the Achilles tendon 7.5 cm proximal to the insertion Nothing really new in terms of symptoms. Patient's area on the tip of the left great toe is just about closed although he still has the probing area on the metatarsal amputation site. Appointment with Dr. Linus Salmons of infectious disease next week 2/7; Dr. Novella Olive did not feel that any further antibiotics were necessary he would follow-up in 2 months. The left great toe appears to be closed still some surface callus that I gently looked under high did not see anything open or anything that was threatening to be open. He still has the open area on the mid part of his TMA site using endoform 2/14; left great toe is closed and he is completing his doxycycline as of last Sunday. He is not on any antibiotics. Unfortunately out of the right foot his wife noticed some subdermal hemorrhage this week. He had been walking 2 miles I had given him permission to do so this is not really a plantar wound. Using endoform to this wound but I changed to silver alginate this week 2/21; left great toe remains closed. Culture last week grew Streptococcus angiosis which I am not really familiar with however it is penicillin sensitive and I am going to put him on Augmentin. Not much change in the wound on the right foot the deep area is still probing precariously close to bone and the wound on the margin of the TMA is larger. 2/28; left great toe remains closed. He is completing the Augmentin I gave him last week. 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 This is being shown to Dr. Andree Elk at [place] to see if there is anything else that can be done here. He  has been using silver alginate strips on the right 3/6; left great toe remains closed. He sees Dr. Andree Elk on Monday. The area on the plantar aspect of the right foot has the same small orifice with thick callused tissue around this. However this time with removal of the callus tissue the wound is open all the way along the incision line  to the end medially. We have been using silver alginate. 3/13; left toe remains closed although the area is callused. He sees Dr. Jacqualyn Posey of podiatry next week. The area on the plantar right foot looked a lot better this week. Culture I did of this was negative we use silver alginate. He is going next week for an attempt at revascularization by Dr. Andree Elk 3/23; left toe remains closed although the area is callused. He will see Dr. Jacqualyn Posey in follow-up. The area on the right plantar foot continues to look surprisingly better over the last 3 visits. We have been using silver alginate. The revascularization he was supposed to have by Dr. Andree Elk at [facility] in [place] has been canceled [facility] procedure] 4/6; the right foot remains closed albeit callused. Podiatry canceled the appointment with regards to the left great toe. He has not seen Dr. Andree Elk at [facility] but thinks that Dr. Andree Elk has "done all he can do". He would be a candidate for the hemostaemix trial Readmission 07/29/2019 Mr. Stann Mainland is a man we know well from at least 3 previous stays in this clinic. He is a type II diabetic with severe PAD followed by Dr. Andree Elk [place] [facility] [place] 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 So far everything in both of these areas has remained closed. About 2 weeks ago he went to see his podiat[facility]. Babs Bertin. He had a thick callus on the left fifth metatarsal head that was shaved. He has developed an open wound in this  area. They have been offloading this in his diabetic shoes. The patient has not had any more revascularizations by Dr. Andree Elk since the last time he was here. ABI in our clinic at the posterior tibial on the left was 1.06 10/6; no real change in the area on the plantar met head. Small wound with 2 mm of depth. He has thick skin probably from pressure around the wound. We have been using silver alginate 10/13; small wound in the left fifth plantar met head. Arrives today with undermining laterally and purulent drainage. Our intake nurse cultured this. His wife stated they noticed a change in color over the last day or 2. He is not systemically unwell 10/19; small wound on the fifth plantar metatarsal head. Culture I did last week showed Staphylococcus lugdunensis. Although this could be a skin contaminant the possibility of a skin and soft tissue infection was there. I did give him empiric doxycycline which should have covered this. We are using silver alginate to the wound. We put him in a total contact cast today. 10/22; small wound on the fifth plantar metatarsal head. He has completed antibiotics. He also has severe PAD which worries me about just about any wound on this man's foot 10/29; small superficial area on the fifth plantar metatarsal head. Measuring slightly smaller. He is not currently on any antibiotics. I been using a total contact cast in this man with very severe PAD but he seems to be tolerating this well 11/5; left plantar fifth metatarsal head. Silver alginate being used under a total contact cast 11/12; wound not much different than last week. Using a #15 scalpel debridement around the wound. Still silver collagen under a total contact cast. He has severe PAD and that may be playing a role in this 11/19;  disappointing that the wound is not really changed that much. I think it is come down in overall surface area because originally this was on the lateral part of the fifth  metatarsal head however recently it is not really changed. Most of this is filled in but it will not epithelialized there is surface debris on this which may be mostly related to ischemia. They have an appointment with Dr. Andree Elk on 12/9 09/24/2019 on evaluation today patient appears to be doing somewhat better with regard to the wound on the left fifth metatarsal head. Fortunately there does not appear to be any signs of active infection at this time. No fevers, chills, nausea, vomiting, or diarrhea. The wound does not appear to be completely closed and I do feel like the Hydrofera Blue is helping to some degree although I feel like the cast as well as keeping things from breaking down or getting any larger at least. As far as his wound overview it shows that the overall size of the wound is slightly smaller but really maintaining within the realm of about the same. He had no troubles with the cast which is good news. 12/1; left fifth metatarsal head. Perhaps somewhat more vibrant. We have been using Hydrofera Blue. He sees Dr. Andree Elk at [place] 1 week tomorrow. He be back next week and I hope to have a better idea which way the wound is going however I will not cast him next week in case Dr. Andree Elk wants to reevaluate things in his foot. The left leg is the leg with the stent in place and I wonder whether these are going to have to be reevaluated. Electronic Signature(s) Signed: 09/30/2019 6:45:39 PM By: [name] MD Entered By: [name] on 09/30/2019 14:20:23 -------------------------------------------------------------------------------- Physical Exam Details Patient Name: Date of Service: SIGMOND, PATALANO 09/30/2019 1:15 PM Medical Record [id number] Patient Account Number: [id number] Date of Birth/Sex: Treating RN: December 02, 1945 (73 y.o. M) Primary Care Provider: [name] Other Clinician: Referring Provider: Treating Provider/Extender:Deejay Koppelman, [name], [name] in  Treatment: 9 Constitutional Sitting or standing Blood Pressure is within target range for patient.. Pulse regular and within target range for patient.Marland Kitchen Respirations regular, non-labored and within target range.. Temperature is normal and within the target range for the patient.Marland Kitchen Appears in no distress. Cardiovascular Pedal pulses are absent on the left. Notes Wound exam; no sharp debridement required. At this point the wound looks a little moist there and with a little more help to the base of it. There is no evidence of surrounding infection. Electronic Signature(s) Signed: 09/30/2019 6:45:39 PM By: [name] MD Entered By: [name] on 09/30/2019 14:26:12 -------------------------------------------------------------------------------- Physician Orders Details Patient Name: Date of Service: JERIEL, VIVANCO 09/30/2019 1:15 PM Medical Record [id number] Patient Account Number: [id number] Date of Birth/Sex: Treating RN: 1946-02-28 (73 y.o. [name]) [name] Primary Care Provider: [name] Other Clinician: Referring Provider: Treating Provider/Extender:Janeli Lewison, [name], [name] in Treatment: 9 Verbal / Phone Orders: No Diagnosis Coding ICD-10 Coding Code Description E11.621 Type 2 diabetes mellitus with foot ulcer E11.51 Type 2 diabetes mellitus with diabetic peripheral angiopathy without gangrene L97.521 Non-pressure chronic ulcer of other part of left foot limited to breakdown of skin L03.116 Cellulitis of left lower limb Follow-up Appointments Return Appointment in 1 week. Dressing Change Frequency Wound #5 Left Metatarsal head fifth Do not change entire dressing for one week. Wound Cleansing Wound #5 Left Metatarsal head fifth May shower with protection. Primary Wound Dressing Wound #5 Left Metatarsal head fifth Hydrofera Blue  Secondary Dressing Wound #5 Left Metatarsal head fifth Dry Gauze Off-Loading Total Contact Cast to Left Lower  Extremity Electronic Signature(s) Signed: 09/30/2019 6:45:39 PM By: [name] MD Signed: 10/07/2019 2:56:39 PM By: [name] RN Entered By: [name] on 09/30/2019 14:17:46 -------------------------------------------------------------------------------- Problem List Details Patient Name: Date of Service: DENCIL, CAYSON 09/30/2019 1:15 PM Medical Record [id number] Patient Account Number: [id number] Date of Birth/Sex: Treating RN: 1946/03/31 (73 y.o. Staci Acosta, [place] Primary Care Provider: [name] Other Clinician: Referring Provider: Treating Provider/Extender:Linzee Depaul, [name], [name] in Treatment: 9 Active Problems ICD-10 Evaluated Encounter Code Description Active Date Today Diagnosis E11.621 Type 2 diabetes mellitus with foot ulcer 07/29/2019 No Yes E11.51 Type 2 diabetes mellitus with diabetic peripheral 07/29/2019 No Yes angiopathy without gangrene L97.521 Non-pressure chronic ulcer of other part of left foot 07/29/2019 No Yes limited to breakdown of skin L03.116 Cellulitis of left lower limb 08/12/2019 No Yes Inactive Problems Resolved Problems Electronic Signature(s) Signed: 09/30/2019 6:45:39 PM By: [name] MD Entered By: [name] on 09/30/2019 14:19:03 -------------------------------------------------------------------------------- Progress Note Details Patient Name: Date of Service: YULIAN, GOSNEY 09/30/2019 1:15 PM Medical Record [id number] Patient Account Number: [id number] Date of Birth/Sex: Treating RN: Aug 16, 1946 (73 y.o. M) Primary Care Provider: [name] Other Clinician: Referring Provider: Treating Provider/Extender:Gerod Caligiuri, [name], [name] in Treatment: 9 Subjective History of Present Illness (HPI) 05/18/16; this is a 73year-old diabetic who is a type II diabetic on insulin. The history is that he traumatized his right foot developed a sore sometime in late March. Shortly thereafter he went  on a cruise but he had to get off the cruise ship in [place] and fly urgently back to [place] where he was admitted to [facility] and ultimately underwent a transmetatarsal amputation by Dr. 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 According to the patient and his wife this wound never really healed. He was seen on 2 occasions in the wound care center in [place] and had vascular studies and then was referred urgently to Dr. Bridgett Larsson of vascular surgery. He underwent an angiogram on 05/10/16. Unfortunately nothing really could be done to improve his vascular status. He had a 75-90% stenosis in the midsegment of 1 segment of the posterior femoral artery. He had a patent popliteal, his anterior tibial occluded shortly after takeoff. Perineal had a greater than 90% stenosis posterior tibial is occluded feet had no distal collaterals feed distal aspect of the transmetatarsal amputation site. The patient tells me that he had a prolonged period of Santyl by Dr. Doran Durand was some initial improvement but then this was stopped. I think they're only applying daily dressings/dry dressings. He has not had a recent x-ray of the right foot he did have one before his surgery in April. His wife by the dimensions of the wound/surgical site being followed at home since 4/20. At that point the dimensions were 0.5 x 12 x 0.2 on 7/19 this was 1.8 x 6 x 0.4. He is not currently on any antibiotics. His hemoglobin A1c in early April was 12.9 at that point he was started on insulin. Apparently his blood sugars are much lower he has an appointment with Dr. Legrand Como Alteimer of endocrine next week. 05/29/16 x-ray of the area did not show osteomyelitis. I think he probably needs an MRI at this point. His wife is asking about something called"Yireh" cream which is not FDA approved. I have not heard of this. 06/22/16; MRI did not really suggest osteomyelitis. There was minimal marrow edema and enhancement  in the stump of the  second metatarsal felt to be secondary likely to postoperative change rather than osteomyelitis. The patient has arranged his own consultation with Dr. Andree Elk at [place], apparently their daughter lives in [place] and has some connection here. Any improvement in vascular supply by Dr. Andree Elk would of course be helpful. Dr. Bridgett Larsson did not feel that anything further could be done other than amputation if wound care did not result in healing or if the area deteriorates. 06/26/16; the patient has been to see Dr. Andree Elk at [place] and had an angiogram. He is going for a procedure on Thursday which will involve catheterization. I'm not sure if this is an anterograde or retrograde approach. He has been using Santyl to the wound 07/10/16; the patient had a repeat angiogram and angioplasty at [place] by Dr. Brunetta Jeans. His angiogram showed right CFA and profundal widely patent. The right as of a.m. popliteal artery were widely patent the right anterior tibial was occluded proximally and reconstitutes at the ankle. Peroneal artery was patent to the foot. Posterior tibial artery was occluded. The patient had angioplasty of the anterior tibial artery.. This was quite successful. He was recommended for Plavix as well as aspirin. 07/17/16; the patient was close to be a nurse visit today however the outer dressing of the Apligraf fell off. Noted drainage. I was asked to see the wound. The patient is noted an odor however his wife had noted that. Drainage with Apligraf not necessarily a bad thing. He has not been systemically unwell 07/24/16; we are still have an issue with drainage of this wound. In spite of this I applied his second Apligraf. Medially the area still is probing to bone. 08/07/16; Apligraf reapplied in general wound looks improved. 08/21/16 Apligraf #4. Wound looks much better 09/04/16 patientt's wound again today continues to appear to improve with the application of the Apligraf's. He notes no increased  discomfort or concerns at this point in time. 09/18/16; the patient returns today 2 weeks after his fifth application of Apligraf. Predictably three quarters of the width of this wound has healed. The deep area that probe to bone medially is still open. The patient asked how much out-of-pocket dollars would be for additional Apligraf's. 09/25/16; now using Hydrofera Blue. He has completed 5 Apligraf applications with considerable improvement in this deep open transmetatarsal amputation site. His wound is now a triangular-shaped wound on the medial aspect. At roughly 12 to 2:00 this probes another centimeter but as opposed to in the past this does not probe to bone. The patient has been seen at [place] by Dr. Zenia Resides. He is not planning to do any more revascularization unless the wound stalls or worsens per the patient 10/02/16; 0.7 x 0.8 x 0.8. Unfortunately although the wound looks stable to improved. There is now easily probable bone. This hasn't been present for several weeks. Patient is not otherwise symptomatic he is not experiencing any pain. I did a culture of the wound bed 10/09/16. Deterioration last week. Culture grew MRSA and although there is improvement here with doxycycline prescribed over the phone I'm going to try to get him linezolid 600 twice a day for 10 days today. 10/16/16; he is completing a weeks worth of linezolid and still has 3 more days to go. Small triangular-shaped open area with some degree of undermining. There is still palpable bone with a curet. Overall the area appears better than last week 10/20/16 he has completed the linezolid still having some nausea and vomiting  but no diarrhea. He has exposed bone this week which is a deterioration. 10/27/16 patient now has a small but probing wound down to bone. Culture of this bone that I did last week showed a few methicillin-resistant staph aureus. I have little doubt that this represents acute/subacute osteomyelitis.  The patient is currently on Doxy which I will continue he also completed 10 days of linezolid. We are now in a difficult situation with this patient's foot after considerable discussion we will send him back to see Dr. Doran Durand for a surgical opinion of this I'm also going to try to arrange a infectious disease consult at [facility] hopefully week and get this prior to her usual 4-6 weeks we having [place]. The patient clearly is going to need 6 weeks of IV vancomycin. If we cannot arrange this expediently I'll have to consider ordering this myself through a home infusion company 11/03/16; the patient now has a small in terms of circumference but probing wound. No bone palpable today. The patient remains on doxycycline 100 twice a day which should support him until he sees infectious disease at [facility] next week the following week on Wednesday I believe he has an appointment with Dr. Andree Elk at [facility] who is his vascular cardiologist. Finally he has an appointment with Dr. Doran Durand on 11/22/16 we have been using silver alginate. The patient's wife states they are having trouble getting this through [facility] 11/13/16; the patient was seen by infectious disease at [facility] in the 11th PICC line placed in preparation for IV antibiotics. A tummy he has not going to get IV vancomycin o Ceftaroline. They've also ordered an MRI. Patient has a follow-up with Dr. Doran Durand on 11/22/16 and Dr. Andree Elk at [facility] tomorrow 11/23/16 the patient is on daptomycin as directed by infectious disease at [facility]. He is also been back to see Dr. Andree Elk at [facility]. He underwent a repeat arteriogram. He had a successful PTA of the right anterior tibial artery. He is on dual antiplatelete treatment with Plavix and aspirin. Finally he had the MRI of his foot in [place]. This showed cellulitis about the foot worse distally edema and enhancement in the reminiscent of the second metatarsal was consistent with osteomyelitis  therefore what I was assuming to be the first metatarsal may be actually the second. He also has a fluid collection deep to the calcaneus at the level of the calcaneal spur which could be an abscess or due to adventitial bursitis. He had a small tear in his Achilles 11/30/16; the patient continues on daptomycin as directed by infectious disease at [facility]. He is been revascularized by Dr. Andree Elk at [facility] in [place]. He has been to see Gretta Arab who was the orthopedic surgeon who did his original amputation. I have not seen his not however per the patient's wife he did not offer another surgical local surgical prodecure to remove involved bone. He verbalized his usual disbelief in not just hyperbarics but any medical therapy for this condition(osteomyelitis). I discussed this in detail with the patient today including answering the question about a BKA definitively "curing" the current condition. 12/07/16; the patient continues on daptomycin as directed by infectious disease at [facility]. This is directed at the MRSA that we cultured from his bone debridement from 12/22. Lab work today shows a white count of 8.7 hemoglobin of 10.5 which is microcytic and hypochromic differential count shows a slightly elevated monocyte count at 1.2 eosinophilic count of 0.6. His creatinine is 1.11 sedimentation rate apparently  is gone from 35-34 now 27. I explained was wife I don't think this represents a trend. His total CK is 48 12/14/16- patient is here for follow-up evaluation of his right TMA site. He continues to receive IV daptomycin per infectious disease. His serum inflammatory markers remain elevated. He complains of intermittent pain to the medial aspect of the TMA site with intermittent erythema. He voices no complaints or concerns regarding hyperbaric therapy. Overall he and his wife are expressing a frustration and discouragement regarrding the length of time of treatment. 12/21/16; small open wound at  roughly the first or second metatarsal metatarsalphalyngeal joint reminiscence of his transmetatarsal amputation site he continues to receive IV daptomycin per infectious disease at [facility]. He will finish these a week tomorrow. He has lab work which I been copied on. His white count is 10.8 hemoglobin 8.9 MCV is low at 75., MCH low at 24.5 platelet count slightly elevated at 626. Differential count shows 70% neutrophils 10% monocytes and 10% eosinophils. His comprehensive metabolic panel shows a slightly low sodium at 133 albumin low at 3.1 total CK is normal at 42 sedimentation rate is much higher at 82. He has had iron studies that show a serum iron of 15 and iron binding capacity of 238 and iron saturation of 6. This is suggestive of iron deficiency. B12 and folate were normal ferritin at 113 The patient tells me that he is not eating well and he has lost weight. He feels episodically nauseated. He is coughing and gagging on mucus which she thinks is sinusitis. He has an appointment with his primary doctor at 5:00 this afternoon in [facility] 01/02/17; the patient developed a subacute pneumonitis. He was admitted to [facility] after a CT scan showed an extensive interstitial pneumonitis [I have not yet seen this]. 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 He has since been discharged. He is not on oxygen. He feels fatigued and very short of breath with exertion. At [facility] the wound care nurse there felt that his wound was healed. He did complete his daptomycin and has follow-up with infectious disease on Friday. X-rays I did before he went to [facility] still suggested residual osteomyelitis in the anterior aspect of the second must metatarsal head. I'm not sure I would've expected any different. There was no other findings. I actually think I did this because of erythema over the first  metatarsal head reminiscent 01/11/17; the patient has eosinophilic pneumonitis. He has been reviewed by pulmonology and given clearance for hyperbarics at least that's what his wife says. I'll need to see if there is note in care everywhere. Apparently the prognosis for improvement of daptomycin induced eosinophilic granulocyte is is 3 months without steroids. In the meantime infectious disease has placed him on doxycycline until the wound is closed. He is still is lost a lot of weight and his blood sugars are running in the mid 60s to low 80s fasting and at all times during the day 01/18/17; he has had adjustments in his insulin apparently his blood sugars in the morning or over 100. He wants to restart his hyperbaric treatment we'll do this at 1:00. 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 I think there is good reason to complete his treatments in order to give him the best chance of maintaining a healed status and these DFU 3 wounds  with MRSA infection in the bone 02/15/17; the patient was seen today in conjunction with HBO. He completed hyperbaric oxygen today. The open area on his transmetatarsal site has remained closed. There was an area of erythema when I saw him earlier in the week on the posterior heel although that is resolved as of today as well. He has been using a cam walker. This is a patient who came to [place] after a transmetatarsal amputation that was necrotic and dehisced. He required revascularization percutaneously on 2 different occasions by Dr. Andree Elk of invasive cardiology at [facility]. 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 He went to [facility] infectious disease and had IV daptomycin for 5 weeks before developing eosinophilic pneumonitis and requiring an admission to hospital/ICU. He made a good  recovery and is continued on doxycycline since. As mentioned his foot is closed now. He has a follow-up with Dr. Andree Elk tomorrow. He is going to [employer] on Monday for a custom-made shoe READMISSION Last visit Dr. Andree Elk [facility] 02/16/17 1. Critical limb ischemia of the RLE:  s/p transmetatarsal amputation of the RLE, now completely healed.  s/p right ATA percutaneous revascularization procedure x2, most recently 11/20/2016 s/p PTA of right AT 100% to less than 20% with a 2.5 x 200 balloon.  He does have some residual osteomyelitis, but given the wound is closed, the orthopedist has recommended to follow. He has a fitting for a special shoe coming up next week. He has completed a course of daptomycin and doxycycline and he has been discharged by ID. He has completed a course of hyperbaric therapy.  Continue Continue medical management with aspirin, Plavix and statin therapy. We will discuss ongoing Plavix therapy at follow-up in 6 months.  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 Given no symptoms, we will conservatively manage. He doesn't want to do any more invasive studies at this time, which is reasonable. The patient arrives today out of 2 concerns both on the right transmetatarsal site. 1 at the level of the reminiscent fifth metatarsal head and the other at roughly the first or second. Both of these look like dark subcutaneous discoloration probably subdermal bleeding. He is recently obtained new adaptive footwear for the right foot. He also has a callus on the left fifth dorsal toe however he follows with podiatry for this and I don't think this is any issue. ABIs in this clinic today were 0.66 on the right and 1.06 on the left. On entrance into our clinic initially this was 0.95 and 0.92. He does not describe current claudication. They're  going away on a cruise in 8 weeks and I think are trying to do the month is much as they can proactively. They follow with podiatry and have an appointment with Dr. Andree Elk in October READMISSION 06/25/18 This is a patient that we have not seen in almost a year. He is a type II diabetic with known PAD. He is followed by Dr. Andree Elk of interventional cardiology at [facility] in [place]. He is required revascularization for significant PAD. When we first saw him he required a transmetatarsal amputation. He had underlying osteomyelitis with a nonhealing surgical wound. This eventually closed with wound care, IV antibiotics and hyperbaric oxygen. They tell me that he has a modified shoe and he is very active  walking up to 4 miles a day. He is followed by Dr. Geroge Baseman of podiatry. His wife states that he underwent a removal of callus over this site on July 9. She felt there may be drainage from this site after that although she could never really determined and there was callus buildup again. On 06/01/18 there was pressure bleeding through the overlying callus. he was given a prescription for 7 days of Bactrim. Fortuitously he has an appointment with Dr. Andree Elk on 06/04/18 and he immediately underwent revascularization of the right leg although I have not had a chance to review these records in care everywhere. This was apparently done through anterior and retrograde access. On 06/06/18 he had another debridement by Dr. Bernette Mayers. Vitamin soaking with Epsom salts for 20 minutes and applying calcium alginate. The original trans-met was on April 2017 I believe by Dr. Doran Durand. His ABI in our clinic was noncompressible today. 07/02/18; x-ray I ordered last week was negative for osteomyelitis. Swab culture was also negative. He is going to require an MRI which I have ordered today. 07/09/18; surprisingly the MRI of the foot that I ordered did not show osteomyelitis. He did suggest the possibility of cellulitis. For this  reason I'll go ahead and give him a 10 day course of doxycycline. Although the previous culture of this area was negative 07/16/18; it arrives with the wound looking much the same. Roughly the same depth. He has thick subcutaneous tissue around the wound orifice but this still has roughly the same depth. Been using silver alginate. We applied Oasis #1 today 07/23/2018; still having to remove a lot of callus and thick subcutaneous tissue to actually define the wound here. Most of this seems to have closed down yet he has a comma shaped divot over the top of the area that still I think is open. We applied Oasis #2 His wife expressed concern about the tip of his left great toe. This almost looks like a small blister. She also showed it today to her podiatrist Dr. Geroge Baseman who did not think this was anything serious. I am not sure is anything serious either however I think it bears some watching. He is not in any pain however he is insensate 07/30/2018;; still on a lot of nonviable tissue over the surface of the wound however cleaning this up reveals a more substantial wound orifice but less of a probing wound depth. This is not probed to bone. There is no evidence of infection The wife is still concerned about a non-open area on the tip of his left great toe. Almost feels like a bony outgrowth. She had previously showed this to podiatry. I do not think this is a blister. A friction area would be possible although he is really not walking according to his wife. He had a small skin tag on his right buttock but no open wound here either 08/06/2018; we applied a third Oasis last week. Unfortunately there is really no improvement. Still requiring extensive debridement to expose the wound bed from a horizontal slitlike depression. I still have not been able to get this to fill in properly. On the positive side there is now no probable bone from when he first came into the facility. I changed him to silver  alginate today after a reasonably aggressive debridement 08/13/2018; once again the patient comes in with skin and subcutaneous tissue closing over the small probing area with the underlying cavity of the wound on the right TMA site. I applied silver  alginate to this last week. Prior to that we used Oasis x3 still not able to get this area granulating. He does not have a probing area of the bone which is an improvement from when I spur started working on this and an MRI did not suggest osteomyelitis. I do not see evidence of infection here but I am increasingly concerned about why I cannot get this area to granulate. Each time I debrided this is looks like this is simply a matter of getting granulation to fill in the hole and then getting epithelialization. This does not seem to happen Also I sent him back to see podiatry Dr. Earleen Newport about the what felt to be bony outgrowth on the tip of his left great toe. Apparently after heel he left the clinic last week or the next day he developed a blood blister. He did see Dr. Earleen Newport. He went on to have a debridement of the medial nail cuticle he now has an open area here as well as some denuded skin. They did an x-ray apparently does have a bony outgrowth or spur but I am not able to look at this. They are using topical antibiotics apparently there was some suggested he use Santyl. Patient's wife was anxious for my opinion of this 08/20/2018; we are able to keep the wound open this time instead of the thick subcutaneous tissue closing over the top of it however unfortunately once again this probes to bone. I do not see any evidence of infection and previous MRI did not show osteomyelitis. I elected to go back to the Oasis to see if we can stimulate some granulation. Last saw his vascular interventional cardiologist Dr. Andree Elk at the beginning of August and he had a repeat procedure. Nevertheless I wonder how much blood flow he has down to this area With  regards to the left first toe he is seeing Dr. Earleen Newport next week. They are applying Bactroban to this area. 08/27/2018 ooOnce again he comes in with thick eschar and subcutaneous tissue over the top of the small probing hole. This does not appear to go down to bone but it still has roughly the same depth. I reapplied Oasis today ooOver the left great toe there appears to be more of the wound at the tip of his toe than there was last week. This has an eschar on the surface of it. Will change to Santyl. There is seeing podiatry this afternoon 09/03/2018 ooHe comes in today with the area on the transmetatarsal's site with a fair amount of callus, nonviable tissue over the circumference but it was not closed. I removed all of this as well as some subcutaneous debris and reapplied Oasis. There is no exposed bone ooThe area over the tip of the left great toe started off as a nodule of uncertain etiology. He has been followed with podiatry. They have been removing part of the medial nail bed. He has nonviable tissue over the wound he been using Santyl in this area 09/10/18 ooUnfortunately comes in with neither wound area looking improved. The transmetatarsal amputation site once Again has nonviable debris over the surface requiring debridement. Unfortunately underneath this there is nothing that looks viable and this once again goes right down to bone. There is no purulent drainage and no erythema. ooAlso the surgical wound from podiatry on the left first toe has an ischemic-looking eschar over the surface of the tip of the toe I have elected not to attempt candidly debride this ooHis wife as  arranged for him to have follow-up noninvasive studies in [place] under the care of Dr. Andree Elk clinic. The question is he has known severe PAD. They had recently seen him in August. He has had revascularizations in both legs within the last 4 or 5 months. He is not complaining of pain and I cannot really get a  history of claudication. He has not systemically unwell 09/20/2018 the patient has been to [facility] and been revascularized by Dr. Andree Elk earlier this week. Apparently he was able to open up the anterior tibial artery although I have not actually seen his formal report. He is going for an attempt to revascularize on the left on Monday. He is apparently working with an investigational stent for lower extremity arteries below the knee and he has talked to the patient about placing that on Monday if possible. The patient has been using silver alginate on the transmetatarsal amputation site and Santyl on the left 09/30/2018; patient had his revascularization on the left this apparently included a standard approach as well as a more distal arterial catheterization although I do not have any information on this from Dr. Andree Elk. In fact I do not even see the initial revascularization that he had on the right. I have included the arterial history from Dr. Andree Elk last note however below; ASSESSMENT/PLAN: 1. Hx of Critical limb ischemia bilateral lower extremities, PAD: -s/p transmetatarsal amputation of the RLE. -s/p right ATA percutaneous revascularization procedure x2, most recently 11/20/2016 s/p PTA of right AT 100% to less than 20% with a 2.5 x 200 balloon. 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 -03/21/2018 s/p PTA of 90% left popliteal artery to <10% with a 5x20 cutting balloon, PTA of 90% left peroneal to <20% with a 3x20 balloon, PTA of 100% left AT to <20% with a 2.5x220 balloon. 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 Will plan on the LLE soon after. Risks/benefits of procedure have been discussed and patient has  elected to proceed. We have been using endoform to the right TMA amputation site wound and Santyl to the left great toe 10/07/2018; the area on the tip of his left great toe looked better we have been using Santyl here. We continue to have a very difficult probing hole on the right TMA amputation site we have been using endoform. I went on to use his fifth Oasis today 10/14/2018; the tip of the left great toe continues to look better. We have been using Santyl here the surface however is healthy and I think we can change to an alginate. The right TMA has not changed. Once again he has no superficial opening there is callus and thick subcutaneous tissue over the orifice once you remove this there is the probing area that we have been dealing with without too much change. There is no palpable bone I have been placing Oasis here and put Oasis #6 in this today after a more vigorous debridement 10/21/18; the left great toe still has necrotic surface requiring debridement. The right TMA site hasn't changed in view of the thick callus over the wound bed. With removal of this there is still the opening however this does not appear to have the same depth. Again there is no palpable  bone. Oasis was replaced 10/28/2018; patient comes in with both wounds looking worse. The area over the first toe tip is now down to bone. The area over the TMA site is deeper and down to bone clearly with a increase in overall wound area. Equally concerning on the right TMA is the complete absence of a pulse this week which is a change. We are not even able to Doppler this. On the right he has a noncompressible ABI greater than 1.4 11/04/2018. Both wounds look somewhat worse. X-rays showed no osteomyelitis of the right foot but on the left there was underlying osteomyelitis in the left great toe distal phalanx. I been on the phone to Dr. Andree Elk surface at [facility] in [place] and they are arranging for another angiogram on the  right on Monday. I have him on doxycycline for the osteomyelitis in the left great toe for now. Infectious disease may be necessary 1/17; 2-week hiatus. Patient was admitted to hospital at [place]. My understanding is he underwent an angioplasty of the right anterior tibial artery and had stents placed in the left anterior artery and the left tibial peroneal trunk. This was done by Dr. Andree Elk of interventional radiology. There is no major change in either 1 of the wounds. They have been using Aquacel Ag. As far as they are aware no imaging studies were done of the foot which is indeed unfortunate. I had him on doxycycline for 2 weeks since we identified the osteomyelitis in the left great toe by plain x-ray. I have renewed that again today. I am still suspicious about osteomyelitis in the amputation site and would consider doing another MRI to compare with the one done in September. The idea of hyperbaric oxygen certainly comes up for discussion 1/24; no major change in either wound area. I have him on doxycycline for osteomyelitis at the tip of the left great toe. As noted he has been previously and recently revascularized by Dr. Andree Elk at [place]. He is tolerating the doxycycline well. For some reason we do not have an infectious disease consult yet. Culture of drainage from the right foot site last week was negative 1/31; MRI of the right foot did not show osteomyelitis of the right ankle and foot. Notable for a skin ulceration overlying the second metatarsal stump with generalizing soft tissue edema of the ankle and foot consistent with cellulitis. Noted to have a partial-thickness tear of the Achilles tendon 7.5 cm proximal to the insertion Nothing really new in terms of symptoms. Patient's area on the tip of the left great toe is just about closed although he still has the probing area on the metatarsal amputation site. Appointment with Dr. Linus Salmons of infectious disease next week 2/7; Dr. Novella Olive did  not feel that any further antibiotics were necessary he would follow-up in 2 months. The left great toe appears to be closed still some surface callus that I gently looked under high did not see anything open or anything that was threatening to be open. He still has the open area on the mid part of his TMA site using endoform 2/14; left great toe is closed and he is completing his doxycycline as of last Sunday. He is not on any antibiotics. Unfortunately out of the right foot his wife noticed some subdermal hemorrhage this week. He had been walking 2 miles I had given him permission to do so this is not really a plantar wound. Using endoform to this wound but I changed to silver alginate  this week 2/21; left great toe remains closed. Culture last week grew Streptococcus angiosis which I am not really familiar with however it is penicillin sensitive and I am going to put him on Augmentin. Not much change in the wound on the right foot the deep area is still probing precariously close to bone and the wound on the margin of the TMA is larger. 2/28; left great toe remains closed. He is completing the Augmentin I gave him last week. 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 This is being shown to Dr. Andree Elk at [facility] to see if there is anything else that can be done here. He has been using silver alginate strips on the right 3/6; left great toe remains closed. He sees Dr. Andree Elk on Monday. The area on the plantar aspect of the right foot has the same small orifice with thick callused tissue around this. However this time with removal of the callus tissue the wound is open all the way along the incision line to the end medially. We have been using silver alginate. 3/13; left toe remains closed although the area is callused. He sees Dr. Jacqualyn Posey of podiatry next week. The area on the plantar right foot looked a lot better this week. Culture I did of this was negative we use  silver alginate. He is going next week for an attempt at revascularization by Dr. Andree Elk 3/23; left toe remains closed although the area is callused. He will see Dr. Jacqualyn Posey in follow-up. The area on the right plantar foot continues to look surprisingly better over the last 3 visits. We have been using silver alginate. The revascularization he was supposed to have by Dr. Andree Elk at [facility] in [place] has been canceled [facility] procedure] 4/6; the right foot remains closed albeit callused. Podiatry canceled the appointment with regards to the left great toe. He has not seen Dr. Andree Elk at [facility] but thinks that Dr. Andree Elk has "done all he can do". He would be a candidate for the hemostaemix trial Readmission 07/29/2019 Mr. Stann Mainland is a man we know well from at least 3 previous stays in this clinic. He is a type II diabetic with severe PAD followed by Dr. Andree Elk at [facility] in [place]. 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 So far everything in both of these areas has remained closed. About 2 weeks ago he went to see his podiatrist at [facility] Dr. Babs Bertin. He had a thick callus on the left fifth metatarsal head that was shaved. He has developed an open wound in this area. They have been offloading this in his diabetic shoes. The patient has not had any more revascularizations by Dr. Andree Elk since the last time he was here. ABI in our clinic at the posterior tibial on the left was 1.06 10/6; no real change in the area on the plantar met head. Small wound with 2 mm of depth. He has thick skin probably from pressure around the wound. We have been using silver alginate 10/13; small wound in the left fifth plantar met head. Arrives today with undermining laterally and purulent drainage. Our intake nurse cultured this. His wife stated they noticed a change in color  over the last day or 2. He is not systemically unwell 10/19; small wound on the fifth plantar metatarsal head.  Culture I did last week showed Staphylococcus lugdunensis. Although this could be a skin contaminant the possibility of a skin and soft tissue infection was there. I did give him empiric doxycycline which should have covered this. We are using silver alginate to the wound. We put him in a total contact cast today. 10/22; small wound on the fifth plantar metatarsal head. He has completed antibiotics. He also has severe PAD which worries me about just about any wound on this man's foot 10/29; small superficial area on the fifth plantar metatarsal head. Measuring slightly smaller. He is not currently on any antibiotics. I been using a total contact cast in this man with very severe PAD but he seems to be tolerating this well 11/5; left plantar fifth metatarsal head. Silver alginate being used under a total contact cast 11/12; wound not much different than last week. Using a #15 scalpel debridement around the wound. Still silver collagen under a total contact cast. He has severe PAD and that may be playing a role in this 11/19; disappointing that the wound is not really changed that much. I think it is come down in overall surface area because originally this was on the lateral part of the fifth metatarsal head however recently it is not really changed. Most of this is filled in but it will not epithelialized there is surface debris on this which may be mostly related to ischemia. They have an appointment with Dr. Andree Elk on 12/9 09/24/2019 on evaluation today patient appears to be doing somewhat better with regard to the wound on the left fifth metatarsal head. Fortunately there does not appear to be any signs of active infection at this time. No fevers, chills, nausea, vomiting, or diarrhea. The wound does not appear to be completely closed and I do feel like the Hydrofera Blue is helping  to some degree although I feel like the cast as well as keeping things from breaking down or getting any larger at least. As far as his wound overview it shows that the overall size of the wound is slightly smaller but really maintaining within the realm of about the same. He had no troubles with the cast which is good news. 12/1; left fifth metatarsal head. Perhaps somewhat more vibrant. We have been using Hydrofera Blue. He sees Dr. Andree Elk at [place] 1 week tomorrow. He be back next week and I hope to have a better idea which way the wound is going however I will not cast him next week in case Dr. Andree Elk wants to reevaluate things in his foot. The left leg is the leg with the stent in place and I wonder whether these are going to have to be reevaluated. Objective Constitutional Sitting or standing Blood Pressure is within target range for patient.. Pulse regular and within target range for patient.Marland Kitchen Respirations regular, non-labored and within target range.. Temperature is normal and within the target range for the patient.Marland Kitchen Appears in no distress. Vitals Time Taken: 1:55 PM, Height: 69 in, Weight: 210 lbs, BMI: 31, Temperature: 98.4 F, Pulse: 74 bpm, Respiratory Rate: 16 breaths/min, Blood Pressure: 116/52 mmHg, Capillary Blood Glucose: 120 mg/dl. General Notes: glucose per pt report Cardiovascular Pedal pulses are absent on the left. General Notes: Wound exam; no sharp debridement required. At this point the wound looks a little moist there and with a little more help to the base of it. There is no evidence of surrounding infection. Integumentary (Hair, Skin) Wound #5 status is Open. Original cause of wound  was Gradually Appeared. The wound is located on the Left Metatarsal head fifth. The wound measures 0.4cm length x 0.6cm width x 0.2cm depth; 0.188cm^2 area and 0.038cm^3 volume. There is Fat Layer (Subcutaneous Tissue) Exposed exposed. There is no tunneling or undermining noted. There is a  small amount of serosanguineous drainage noted. The wound margin is thickened. There is large (67-100%) pink granulation within the wound bed. There is a small (1-33%) amount of necrotic tissue within the wound bed including Adherent Slough. 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 There was a Total Contact Cast Procedure by Ricard Dillon., MD. Post procedure Diagnosis Wound #5: Same as Pre-Procedure Plan Follow-up Appointments: Return Appointment in 1 week. Dressing Change Frequency: Wound #5 Left Metatarsal head fifth: Do not change entire dressing for one week. Wound Cleansing: Wound #5 Left Metatarsal head fifth: May shower with protection. Primary Wound Dressing: Wound #5 Left Metatarsal head fifth: Hydrofera Blue Secondary Dressing: Wound #5 Left Metatarsal head fifth: Dry Gauze Off-Loading: Total Contact Cast to Left Lower Extremity 1. Left fifth metatarsal head. I think the wound looks somewhat better although it is certainly not better in terms of surface area. I am going to continue with Hydrofera Blue under the total contact cast 2. Sees Dr. Andree Elk next Wednesday. I will not put the cast back on next week. He may need to reevaluate the stents in the left Electronic Signature(s) Signed: 10/03/2019 7:57:15 AM By: [name] MD Signed: 10/06/2019 5:51:44 PM By: Levan Hurst RN, BSN Previous Signature: 09/30/2019 6:45:39 PM Version By: [name] MD Entered By: Levan Hurst on 10/02/2019 10:45:18 -------------------------------------------------------------------------------- Total  Contact Cast Details Patient Name: Date of Service: JERMANIE, MINSHALL 09/30/2019 1:15 PM Medical Record [id number] Patient Account Number: [id number] Date of Birth/Sex: 1946/06/28 (73 y.o. M) Treating RN: [name] Primary Care Provider: [name] Other Clinician: Referring Provider: Treating Provider/Extender:Avaeh Ewer, [name], [name] in Treatment: 9 Total Contact Cast Applied for Wound Assessment: Wound #5 Left Metatarsal head fifth Performed By: Physician Ricard Dillon., MD Post Procedure Diagnosis Same as Pre-procedure Electronic Signature(s) Signed: 09/30/2019 6:45:39 PM By: [name] MD Signed: 10/07/2019 2:56:39 PM By: [name] RN Entered By: [name] on 09/30/2019 14:51:17 -------------------------------------------------------------------------------- [place] Details Patient Name: Date of Service: WOLF, BOULAY 09/30/2019 Medical Record [id number] Patient Account Number: [id number] Date of Birth/Sex: Treating RN: [birth year] (73 y.o. M) Primary Care Provider: [name] Other Clinician: Referring Provider: Treating Provider/Extender:Sandara Tyree, [name], [name] in Treatment: 9 Diagnosis Coding ICD-10 Codes Code Description E11.621 Type 2 diabetes mellitus with foot ulcer E11.51 Type 2 diabetes mellitus with diabetic peripheral angiopathy without gangrene L97.521 Non-pressure chronic ulcer of other part of left foot limited to breakdown of skin L03.116 Cellulitis of left lower limb Facility Procedures CPT4 Code Description: [id number] - APPLY TOTAL CONTACT LEG CAST ICD-10 Diagnosis Description E11.621 Type 2 diabetes mellitus with foot ulcer L97.521 Non-pressure chronic ulcer of other part of left foot limite Modifier: d to breakdo Quantity: 1 wn of skin Physician Procedures CPT4 Code Description: [id number] - WC PHYS APPLY TOTAL CONTACT CAST ICD-10 Diagnosis Description E11.621 Type 2 diabetes mellitus with  foot ulcer L97.521 Non-pressure chronic  ulcer of other part of left foot limited Modifier: to breakdow Quantity: 1 n of skin Electronic Signature(s) Signed: 09/30/2019 6:45:39 PM By: [name] MD Entered By: [name] on 09/30/2019 14:27:32

## 2019-10-07 NOTE — Progress Notes (Signed)
TRAEGER, SULTANA ([id number]) Visit Report for 07/29/2019 Allergy List Details Patient Name: Date of Service: [name], [name] 07/29/2019 1:15 PM Medical Record [id number] Patient Account Number: [id number] Date of Birth/Sex: Treating RN: [birth year] (73 y.o. [name] Primary Care [name]: [name] Other Clinician: Referring [name]: Treating [name]/[name] in Treatment: 0 Allergies Active Allergies daptomycin Reaction: pneumonia Severity: Severe Allergy Notes Electronic Signature(s) Signed: 07/29/2019 5:36:16 PM By: Baruch Gouty RN, BSN Entered By: Baruch Gouty on 07/29/2019 13:26:16 -------------------------------------------------------------------------------- Arrival Information Details Patient Name: Date of Service: [name] 07/29/2019 1:15 PM Medical Record [id number] Patient Account Number: [id number] Date of Birth/Sex: Treating RN: 03-01-1946 (73 y.o. [name] Primary Care [name]: [name] Other Clinician: Referring [name]: Treating [name]/[name] in Treatment: 0 Visit Information Patient Arrived: Ambulatory Arrival Time: 13:13 Accompanied By: spouse Transfer Assistance: None Patient Identification Verified: Yes Secondary Verification Process Completed: Yes Patient Requires Transmission-Based No Precautions: Patient Has Alerts: No History Since Last Visit Added or deleted any medications: No Any new allergies or adverse reactions: No Had a fall or experienced change in activities of daily living that may affect risk of falls: No Signs or symptoms of abuse/neglect since last visito No Hospitalized since last visit: No Implantable device outside of the clinic excluding cellular tissue based products placed in the center since last visit: No Has Dressing in Place as Prescribed: Yes Pain Present Now: No Electronic  Signature(s) Signed: 07/29/2019 5:36:16 PM By: Baruch Gouty RN, BSN Entered By: Baruch Gouty on 07/29/2019 13:20:35 -------------------------------------------------------------------------------- Clinic Level of Care Assessment Details Patient Name: Date of Service: [name] 07/29/2019 1:15 PM Medical Record [id number] Patient Account Number: [id number] Date of Birth/Sex: Treating RN: 08-05-1946 (72 y.o. [name]) [name] Primary Care [name]: [name] Other Clinician: Referring [name]: Treating [name]/[name] in Treatment: 0 Clinic Level of Care Assessment Items TOOL 1 Quantity Score X - Use when EandM and Procedure is performed on INITIAL visit 1 0 ASSESSMENTS - Nursing Assessment / Reassessment X - General Physical Exam (combine w/ comprehensive assessment (listed just below) 1 20 when performed on new pt. evals) X - Comprehensive Assessment (HX, ROS, Risk Assessments, Wounds Hx, etc.) 1 25 ASSESSMENTS - Wound and Skin Assessment / Reassessment []  - Dermatologic / Skin Assessment (not related to wound area) 0 ASSESSMENTS - Ostomy and/or Continence Assessment and Care []  - Incontinence Assessment and Management 0 []  - Ostomy Care Assessment and Management (repouching, etc.) 0 PROCESS - Coordination of Care X - Simple Patient / Family Education for ongoing care 1 15 []  - Complex (extensive) Patient / Family Education for ongoing care 0 X - Staff obtains Programmer, systems, Records, Test Results / Process Orders 1 10 []  - Staff telephones HHA, Nursing Homes / Clarify orders / etc 0 []  - Routine Transfer to another Facility (non-emergent condition) 0 []  - Routine Hospital Admission (non-emergent condition) 0 X - New Admissions / Biomedical engineer / Ordering NPWT, Apligraf, etc. 1 15 []  - Emergency Hospital Admission (emergent condition) 0 PROCESS - Special Needs []  - Pediatric / Minor Patient Management 0 []  - Isolation  Patient Management 0 []  - Hearing / Language / Visual special needs 0 []  - Assessment of Community assistance (transportation, D/C planning, etc.) 0 []  - Additional assistance / Altered mentation 0 []  - Support Surface(s) Assessment (bed, cushion, seat, etc.) 0 INTERVENTIONS - Miscellaneous []  - External ear exam 0 []  - Patient Transfer (multiple staff / Civil Service fast streamer / Similar devices) 0 []  -  Simple Staple / Suture removal (25 or less) 0 []  - Complex Staple / Suture removal (26 or more) 0 []  - Hypo/Hyperglycemic Management (do not check if billed separately) 0 X - Ankle / Brachial Index (ABI) - do not check if billed separately 1 15 Has the patient been seen at the hospital within the last three years: Yes Total Score: 100 Level Of Care: New/Established - Level 3 Electronic Signature(s) Signed: 10/07/2019 2:58:33 PM By: Yevonne PaxEpps, Carrie RN Entered By: Yevonne PaxEpps, Carrie on 07/29/2019 14:24:22 -------------------------------------------------------------------------------- Encounter Discharge Information Details Patient Name: Date of Service: [name] 07/29/2019 1:15 PM Medical Record [id number] Patient Account Number: [id number] Date of Birth/Sex: Treating RN: 1946/05/06 40(72 y.o. [name]) [name] Primary Care Brisha Mccabe: Todd FlavinHoward, Todd Other Clinician: Referring Kord Monette: Treating Lexington Krotz/Extender:[name], [name], Wadie LessenKevin Guzman in Treatment: 0 Encounter Discharge Information Items Post Procedure Vitals Discharge Condition: Stable Temperature (F): 98.1 Ambulatory Status: Ambulatory Pulse (bpm): 72 Discharge Destination: Home Respiratory Rate (breaths/min): 18 Transportation: Private Auto Blood Pressure (mmHg): 123/70 Accompanied By: wife Schedule Follow-up Appointment: Yes Clinical Summary of Care: Patient Declined Electronic Signature(s) Signed: 08/07/2019 4:32:31 PM By: Cherylin Mylarwiggins, Guzman Entered By: Cherylin Mylarwiggins, Guzman on 07/29/2019  15:04:59 -------------------------------------------------------------------------------- Lower Extremity Assessment Details Patient Name: Date of Service: [name] 07/29/2019 1:15 PM Medical Record [id number] Patient Account Number: [id number] Date of Birth/Sex: Treating RN: 1946/05/06 (72 y.o. Damaris SchoonerM) Boehlein, Linda Primary Care Blessin Kanno: Todd FlavinHoward, Todd Other Clinician: Referring Ronny Ruddell: Treating Jessiah Wojnar/Extender:[name], [name], Wadie LessenKevin Guzman in Treatment: 0 Edema Assessment Assessed: [Left: No] [Right: No] E[Left: dema] [Right: :] Calf Left: Right: Point of Measurement: cm From Medial Instep 41.7 cm cm Ankle Left: Right: Point of Measurement: cm From Medial Instep 26.6 cm cm Vascular Assessment Pulses: Dorsalis Pedis Palpable: [Left:No] Blood Pressure: Brachial: [Left:123] Ankle: [Left:Posterior Tibial: 130 1.06] Notes left DP pulse noncompressible Electronic Signature(s) Signed: 07/29/2019 5:36:16 PM By: Zenaida DeedBoehlein, Linda RN, BSN Entered By: Zenaida DeedBoehlein, Linda on 07/29/2019 13:42:49 -------------------------------------------------------------------------------- Multi Wound Chart Details Patient Name: Date of Service: Todd LootsRODGERS, Foch 07/29/2019 1:15 PM Medical Record [id number] Patient Account Number: [id number] Date of Birth/Sex: Treating RN: 1946/05/06 (72 y.o. 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] Comorbid History: [5:Cataracts, Chronic sinus N/A problems/congestion, Coronary Artery Disease, Hypertension, Peripheral Arterial Disease, Type II Diabetes, Osteoarthritis, Neuropathy] Date Acquired: [5:07/14/2019]  [N/A:N/A] Guzman of Treatment: [5:0] [N/A:N/A] Wound Status: [5:Open] [N/A:N/A] Measurements L x W x D 0.2x0.4x0.2 [N/A:N/A] (cm) Area (cm) : [5:0.063] [N/A:N/A] Volume (cm) : [5:0.013] [N/A:N/A] % Reduction in Area: [5:0.00%] [N/A:N/A] % Reduction in Volume: 0.00% [N/A:N/A] Starting Position 1 4 (o'clock): Ending Position 1 [5:8] (o'clock): Maximum Distance 1 [5:0.2] (cm): Undermining: [5:Yes] [N/A:N/A] Classification: [5:Grade 1] [N/A:N/A] Exudate Amount: [5:None Present] [N/A:N/A] Wound Margin: [5:Thickened] [N/A:N/A] Granulation Amount: [5:Large (67-100%)] [N/A:N/A] Granulation Quality: [5:Pink, Pale] [N/A:N/A] Necrotic Amount: [5:None Present (0%)] [N/A:N/A] Exposed Structures: [5:Fat Layer (Subcutaneous N/A Tissue) Exposed: Yes Fascia: No Tendon: No Muscle: No Joint: No Bone: No Small (1-33%)] [N/A:N/A] Treatment Notes Electronic Signature(s) Signed: 07/29/2019 5:43:25 PM By: Baltazar Najjarobson, Michael MD Signed: 10/07/2019 2:58:33 PM By: Yevonne PaxEpps, Carrie RN Entered By: Baltazar Najjarobson, Michael on 07/29/2019 14:20:44 -------------------------------------------------------------------------------- Multi-Disciplinary Care Plan Details Patient Name: Date of Service: [name] 07/29/2019 1:15 PM Medical Record [id number] Patient Account Number: [id number] Date of Birth/Sex: Treating RN: 1946/05/06 (72 y.o. Lina SarM) [facility]pps[facility]  Pooja Camuso: [name] Other Clinician: Referring Filippa Yarbough: Treating Corion Sherrod/Extender:[name], Todd Congo, [name] in Treatment: 0 Active Inactive Wound/Skin Impairment Nursing Diagnoses: Knowledge deficit related to ulceration/compromised skin integrity Goals: Patient/caregiver will verbalize understanding of skin care regimen Date Initiated: 07/29/2019 Target Resolution Date: 08/22/2019 Goal Status: Active Ulcer/skin breakdown will have a volume reduction of 30% by week 4 Date Initiated: 07/29/2019 Target Resolution Date: 08/22/2019 Goal  Status: Active Interventions: Assess patient/caregiver ability to obtain necessary supplies Assess patient/caregiver ability to perform ulcer/skin care regimen upon admission and as needed Assess ulceration(s) every visit Notes: Electronic Signature(s) Signed: 10/07/2019 2:58:33 PM By: [name] RN Entered By: [name] on 07/29/2019 14:04:14 -------------------------------------------------------------------------------- Pain Assessment Details Patient Name: Date of Service: [name], [name] 07/29/2019 1:15 PM Medical Record [id number] Patient Account Number: [id number] Date of Birth/Sex: Treating RN: 1946/03/13 (73 y.o. [name] Primary Care Diania Co: [name] Other Clinician: Referring Deshea Pooley: Treating Renesmae Donahey/Extender:[name], Todd Congo, [name] in Treatment: 0 Active Problems Location of Pain Severity and Description of Pain Patient Has Paino No Site Locations Rate the pain. Current Pain Level: 0 Pain Management and Medication Current Pain Management: Electronic Signature(s) Signed: 07/29/2019 5:36:16 PM By: [name] RN, BSN Entered By: [name] on 07/29/2019 13:44:59 -------------------------------------------------------------------------------- Patient/Caregiver Education Details Patient Name: Date of Service: DOMNICK, CHERVENAK 9/29/2020andnbsp1:15 PM Medical Record [id number] Patient Account Number: [id number] Date of Birth/Gender: May 26, 1946 (73 y.o. M) Treating RN: [name] Primary Care Physician: [name] Other Clinician: Referring Physician: Treating Physician/Extender:[name], Todd Congo, [name] in Treatment: 0 Education Assessment Education Provided To: Patient Education Topics Provided Wound/Skin Impairment: Methods: Explain/Verbal Responses: State content correctly Electronic Signature(s) Signed: 10/07/2019 2:58:33 PM By: [name] RN Entered By: [name] on 07/29/2019  14:04:26 -------------------------------------------------------------------------------- Wound Assessment Details Patient Name: Date of Service: [name], [name] 07/29/2019 1:15 PM Medical Record [id number] Patient Account Number: [id number] Date of Birth/Sex: Treating RN: [birth year] (73 y.o. [name] Primary Care Sheniya Garciaperez: [name] Other Clinician: Referring Keiva Dina: Treating Keiarah Orlowski/Extender:[name], Todd Congo, [name] in Treatment: 0 Wound Status Wound Number: 5 Primary Diabetic Wound/Ulcer of the Lower Extremity Etiology: Wound Location: Left Metatarsal head fifth Wound Open Wounding Event: Gradually Appeared Status: Date Acquired: 07/14/2019 Comorbid Cataracts, Chronic sinus problems/congestion, Guzman Of Treatment: 0 History: Coronary Artery Disease, Hypertension, Clustered Wound: No Peripheral Arterial Disease, Type II Diabetes, Osteoarthritis, Neuropathy Photos Wound Measurements Length: (cm) 0.2 % Reduction in A Width: (cm) 0.4 % Reduction in V Depth: (cm) 0.2 Epithelializatio Area: (cm) 0.063 Tunneling: Volume: (cm) 0.013 Undermining: Starting Posi Ending Positi Maximum Dista rea: 0% olume: 0% n: Small (1-33%) No Yes tion (o'clock): 4 on (o'clock): 8 nce: (cm) 0.2 Wound Description Classification: Grade 1 Foul Odor After Wound Margin: Thickened Slough/Fibrino Exudate Amount: None Present Wound Bed Granulation Amount: Large (67-100%) Granulation Quality: Pink, Pale Fascia Exposed: Necrotic Amount: None Present (0%) Fat Layer (Subcu Tendon Exposed: Muscle Exposed: Joint Exposed: Bone Expo Cleansing: No No Exposed Structure No taneous Tissue) Exposed: Yes No No No sed: No Electronic Signature(s) Signed: 07/30/2019 6:37:17 PM By: [name] RN, BSN Signed: 07/31/2019 3:21:35 PM By: Benjaman Kindler EMT/HBOT Previous Signature: 07/29/2019 5:36:16 PM Version By: [name] RN, BSN Entered By: Benjaman Kindler  on 07/30/2019 08:51:56 -------------------------------------------------------------------------------- Vitals Details Patient Name: Date of Service: [name], [name] 07/29/2019 1:15 PM Medical Record [id number] Patient Account Number: [id number] Date of Birth/Sex: Treating RN: [birth year] (73 y.o. Todd Right Primary Care Joene Gelder: [name] Other Clinician: Referring Abigayle Wilinski: Treating Spencer Cardinal/Extender:[name], Todd Congo, [name] in Treatment: 0 Vital  Signs Time Taken: 14:45 Temperature (F): 98.1 Pulse (bpm): 72 Respiratory Rate (breaths/min): 18 Blood Pressure (mmHg): 123/70 Reference Range: 80 - 120 mg / dl Electronic Signature(s) Signed: 08/07/2019 4:32:31 PM By: Cherylin Mylar Entered By: Cherylin Mylar on 07/29/2019 15:04:22

## 2019-10-07 NOTE — Progress Notes (Signed)
[name], [name] ([id number]) Visit Report for 09/24/2019 Arrival Information Details Patient Name: Date of Service: ANTRONE, WALLA 09/24/2019 9:30 AM Medical Record [id number] Patient Account Number: [id number] Date of Birth/Sex: Treating RN: 1946/04/14 (73 y.o. [name]) [name] Primary Care Ellinore Merced: [name] Other Clinician: Referring Dona Walby: Treating Geno Sydnor/Extender:Stone III, Andrey Campanile, [name] in Treatment: 8 Visit Information History Since Last Visit All ordered tests and consults were No Patient Arrived: Ambulatory completed: Arrival Time: 10:27 Added or deleted any medications: No Accompanied By: wife Any new allergies or adverse reactions: No Transfer Assistance: None Had a fall or experienced change in No Patient Identification Verified: Yes activities of daily living that may affect Secondary Verification Process Yes risk of falls: Completed: Signs or symptoms of abuse/neglect No Patient Requires Transmission-Based No since last visito Precautions: Hospitalized since last visit: No Patient Has Alerts: No Implantable device outside of the clinic No excluding cellular tissue based products placed in the center since last visit: Has Dressing in Place as Prescribed: Yes Has Compression in Place as Prescribed: Yes Has Footwear/Offloading in Place as Yes Prescribed: Right: Total Contact Cast Pain Present Now: No Electronic Signature(s) Signed: 10/07/2019 2:55:30 PM By: [name] RN Entered By: [name] on 09/24/2019 10:28:44 -------------------------------------------------------------------------------- Encounter Discharge Information Details Patient Name: Date of Service: [name], [name] 09/24/2019 9:30 AM Medical Record [id number] Patient Account Number: [id number] Date of Birth/Sex: Treating RN: 12-Nov-1945 (73 y.o. [name] Primary Care Kyli Sorter: [name] Other Clinician: Referring Diarra Ceja: Treating  Charlesia Canaday/Extender:Stone III, Andrey Campanile, [name] in Treatment: 8 Encounter Discharge Information Items Discharge Condition: Stable Ambulatory Status: Ambulatory Discharge Destination: Home Transportation: Private Auto Accompanied By: self Schedule Follow-up Appointment: Yes Clinical Summary of Care: Electronic Signature(s) Signed: 09/24/2019 3:16:44 PM By: Shawn Stall Entered By: Shawn Stall on 09/24/2019 11:14:10 -------------------------------------------------------------------------------- Lower Extremity Assessment Details Patient Name: Date of Service: [name], [name] 09/24/2019 9:30 AM Medical Record [id number] Patient Account Number: [id number] Date of Birth/Sex: Treating RN: 02/14/1946 (73 y.o. [name]) [name] Primary Care Burnham Trost: [name] Other Clinician: Referring Megham Dwyer: Treating Hetty Linhart/Extender:Stone III, Andrey Campanile, [name] in Treatment: 8 Edema Assessment Assessed: [Left: No] [Right: No] Edema: [Left: N] [Right: o] Calf Left: Right: Point of Measurement: cm From Medial Instep 38 cm cm Ankle Left: Right: Point of Measurement: cm From Medial Instep 26 cm cm Electronic Signature(s) Signed: 10/07/2019 2:55:30 PM By: [name] RN Entered By: [name] on 09/24/2019 10:40:23 -------------------------------------------------------------------------------- Multi-Disciplinary Care Plan Details Patient Name: Date of Service: Todd, Guzman 09/24/2019 9:30 AM Medical Record [id number] Patient Account Number: [id number] Date of Birth/Sex: Treating RN: 08-08-1946 (73 y.o. [name] Primary Care Jeniel Slauson: [name] Other Clinician: Referring Flynn Lininger: Treating Cathline Dowen/Extender:Stone III, Andrey Campanile, [name] in Treatment: 8 Active Inactive Wound/Skin Impairment Nursing Diagnoses: Knowledge deficit related to ulceration/compromised skin integrity Goals: Patient/caregiver will verbalize understanding of skin  care regimen Date Initiated: 07/29/2019 Target Resolution Date: 10/02/2019 Goal Status: Active Ulcer/skin breakdown will have a volume reduction of 30% by week 4 Date Initiated: 07/29/2019 Target Resolution Date: 10/10/2019 Goal Status: Active Interventions: Assess patient/caregiver ability to obtain necessary supplies Assess patient/caregiver ability to perform ulcer/skin care regimen upon admission and as needed Assess ulceration(s) every visit Notes: Electronic Signature(s) Signed: 09/24/2019 3:14:28 PM By: [name] RN, BSN Entered By: [name] on 09/24/2019 09:40:20 -------------------------------------------------------------------------------- Pain Assessment Details Patient Name: Date of Service: Todd, Guzman 09/24/2019 9:30 AM Medical Record [id number] Patient Account Number: [id number] Date of Birth/Sex: Treating RN: [birth year] (73 y.o. [name]) [name] Primary Care Prather Failla:  [name] Other Clinician: Referring Janasha Barkalow: Treating Calyb Mcquarrie/Extender:Stone III, Zadie Rhine, [name] in Treatment: 8 Active Problems Location of Pain Severity and Description of Pain Patient Has Paino No Site Locations Pain Management and Medication Current Pain Management: Electronic Signature(s) Signed: 10/07/2019 2:55:30 PM By: [name] RN Entered By: [name] on 09/24/2019 10:29:37 -------------------------------------------------------------------------------- Patient/Caregiver Education Details Patient Name: [name] 11/25/2020andnbsp9:30 Date of Service: AM Medical Record [id number] Number: Patient Account Number: [id number] Treating RN: Date of Birth/Gender: 04/08/1946 (73 y.o. [name]) Other Clinician: Primary Care Treating Leota Sauers Physician: Physician/Extender: Referring Physician: Darolyn Rua in Treatment: 8 Education Assessment Education Provided To: Patient Education Topics  Provided Elevated Blood Sugar/ Impact on Healing: Methods: Explain/Verbal Responses: Reinforcements needed, State content correctly Offloading: Methods: Explain/Verbal Responses: Reinforcements needed, State content correctly Wound/Skin Impairment: Methods: Explain/Verbal Responses: Reinforcements needed, State content correctly Electronic Signature(s) Signed: 09/24/2019 3:14:28 PM By: Baruch Gouty RN, BSN Entered By: Baruch Gouty on 09/24/2019 09:40:46 -------------------------------------------------------------------------------- Wound Assessment Details Patient Name: Date of Service: WOODROE, VOGAN 09/24/2019 9:30 AM Medical Record [id number] Patient Account Number: [id number] Date of Birth/Sex: Treating RN: 21-Oct-1946 (73 y.o. [name]) [name] Primary Care Rhett Mutschler: [name] Other Clinician: Referring Loghan Subia: Treating Ladarrell Cornwall/Extender:Stone III, Zadie Rhine, [name] in Treatment: 8 Wound Status Wound Number: 5 Primary Diabetic Wound/Ulcer of the Lower Extremity Etiology: Wound Location: Left Metatarsal head fifth Wound Open Wounding Event: Gradually Appeared Status: Date Acquired: 07/14/2019 Comorbid Cataracts, Chronic sinus Weeks Of Treatment: 8 History: problems/congestion, Coronary Artery Clustered Wound: No Disease, Hypertension, Peripheral Arterial Disease, Type II Diabetes, Osteoarthritis, Neuropathy Photos Wound Measurements Length: (cm) 0.5 Width: (cm) 0.6 Depth: (cm) 0.2 Area: (cm) 0.236 Volume: (cm) 0.047 Wound Description Classification: Grade 1 Wound Margin: Thickened Exudate Amount: Small Exudate Type: Serosanguineous Exudate Color: red, brown Wound Bed Granulation Amount: Large (67-100%) Granulation Quality: Pink Necrotic Amount: Small (1-33%) Necrotic Quality: Adherent Slough After Cleansing: No brino No Exposed Structure posed: No (Subcutaneous Tissue) Exposed: Yes sed: No sed: No ed: No d: No % Reduction  in Area: -274.6% % Reduction in Volume: -261.5% Epithelialization: Medium (34-66%) Tunneling: No Undermining: No Foul Odor Slough/Fi Fascia Ex Fat Layer Tendon Expo Muscle Expo Joint Expos Bone Expose Electronic Signature(s) Signed: 10/07/2019 2:54:39 PM By: Mikeal Hawthorne EMT/HBOT Signed: 10/07/2019 2:55:30 PM By: [name] RN Entered By: Mikeal Hawthorne on 10/06/2019 12:27:35 -------------------------------------------------------------------------------- Vitals Details Patient Name: Date of Service: VERNOR, MONNIG 09/24/2019 9:30 AM Medical Record [id number] Patient Account Number: [id number] Date of Birth/Sex: Treating RN: November 25, 1945 (73 y.o. [name] Primary Care Skylan Gift: [name] Other Clinician: Referring Selen Smucker: Treating Bryker Fletchall/Extender:Stone III, Zadie Rhine, [name] in Treatment: 8 Vital Signs Time Taken: 10:28 Temperature (F): 98.6 Height (in): 69 Pulse (bpm): 75 Weight (lbs): 210 Respiratory Rate (breaths/min): 18 Body Mass Index (BMI): 31 Blood Pressure (mmHg): 137/82 Reference Range: 80 - 120 mg / dl Electronic Signature(s) Signed: 10/07/2019 2:55:30 PM By: [name] RN Entered By: [name] on 09/24/2019 10:29:30

## 2019-10-07 NOTE — Progress Notes (Signed)
DAXTYN, ROTTENBERG ([id number]) Visit Report for 08/12/2019 Debridement Details Patient Name: Date of Service: [name], [name] 08/12/2019 1:15 PM Medical Record [id number] Patient Account Number: [id number] Date of Birth/Sex: Nov 24, 1945 (73 y.o. M) Treating RN: [name] Primary Care Provider: [name] Other Clinician: Referring Provider: Treating Provider/Extender:[name], [name], [name] in Treatment: 2 Debridement Performed for Wound #5 Left Metatarsal head fifth Assessment: Performed By: Physician Ricard Dillon., MD Debridement Type: Debridement Severity of Tissue Pre Fat layer exposed Debridement: Level of Consciousness (Pre- Awake and Alert procedure): Pre-procedure Verification/Time Out Taken: Yes - 14:06 Start Time: 14:06 Pain Control: Lidocaine 5% topical ointment Total Area Debrided (L x W): 0.2 (cm) x 0.3 (cm) = 0.06 (cm) Tissue and other material Non-Viable, Callus, Skin: Dermis , Skin: Epidermis debrided: Level: Skin/Epidermis Debridement Description: Selective/Open Wound Instrument: Blade, Forceps Specimen: Tissue Culture Number of Specimens Taken: 1 Bleeding: Minimum Hemostasis Achieved: Pressure End Time: 15:11 Procedural Pain: 0 Post Procedural Pain: 0 Response to Treatment: Procedure was tolerated well Level of Consciousness Awake and Alert (Post-procedure): Post Debridement Measurements of Total Wound Length: (cm) 0.2 Width: (cm) 0.3 Depth: (cm) 0.2 Volume: (cm) 0.009 Character of Wound/Ulcer Post Improved Debridement: Severity of Tissue Post Debridement: Fat layer exposed Post Procedure Diagnosis Same as Pre-procedure Electronic Signature(s) Signed: 08/12/2019 5:42:33 PM By: [name] MD Signed: 10/07/2019 3:01:15 PM By: [name] RN Entered By: [name] on 08/12/2019 14:16:55 -------------------------------------------------------------------------------- HPI Details Patient Name: Date of  Service: [name], [name] 08/12/2019 1:15 PM Medical Record [id number] Patient Account Number: [id number] Date of Birth/Sex: Treating RN: 1945-11-30 (72 y.o. [name] Primary Care Provider: [name] Other Clinician: Referring Provider: Treating Provider/Extender:[name], [name], [name] in Treatment: 2 History of Present Illness HPI Description: 05/18/16; this is a 73year-old diabetic who is a type II diabetic on insulin. The history is that he traumatized his right foot developed a sore sometime in late March. 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 According to the patient and his wife this wound never really healed. He was seen on 2 occasions in the wound care center in [place] and had vascular studies and then was referred urgently to Dr. Bridgett Larsson of vascular surgery. He underwent an angiogram on 05/10/16. Unfortunately nothing really could be done to improve his vascular status. He had a 75-90% stenosis in the midsegment of 1 segment of the posterior femoral artery. He had a patent popliteal, his anterior tibial occluded shortly after takeoff. Perineal had a greater than 90% stenosis posterior tibial is occluded feet had no distal collaterals feed distal aspect of the transmetatarsal amputation site. The patient tells me that he had a prolonged period of Santyl by Dr. Doran Durand was some initial improvement but then this was stopped. I think they're only applying daily dressings/dry dressings. He has not had a recent x-ray of the right foot he did have one before his surgery in April. His wife by the dimensions of the wound/surgical site being followed at home since 4/20. At that point the dimensions were 0.5 x 12 x 0.2 on 7/19 this was 1.8 x 6  x 0.4. He is not currently on any antibiotics. His hemoglobin A1c in early April was 12.9 at that point he was started on insulin. Apparently his blood sugars are  much lower he has an appointment with Dr. Legrand Como Alteimer of endocrine next week. 05/29/16 x-ray of the area did not show osteomyelitis. I think he probably needs an MRI at this point. His wife is asking about something called"Yireh" cream which is not FDA approved. I have not heard of this. 06/22/16; MRI did not really suggest osteomyelitis. There was minimal marrow edema and enhancement in the stump of the second metatarsal felt to be secondary likely to postoperative change rather than osteomyelitis. The patient has arranged his own consultation with Dr. Andree Elk at [facility], apparently their daughter lives in [place] and has some connection here. Any improvement in vascular supply by Dr. Andree Elk would of course be helpful. Dr. Bridgett Larsson did not feel that anything further could be done other than amputation if wound care did not result in healing or if the area deteriorates. 06/26/16; the patient has been to see Dr. Andree Elk at [place] and had an angiogram. He is going for a procedure on Thursday which will involve catheterization. I'm not sure if this is an anterograde or retrograde approach. He has been using Santyl to the wound 07/10/16; the patient had a repeat angiogram and angioplasty at [facility] by Dr. Brunetta Jeans. His angiogram showed right CFA and profundal widely patent. The right as of a.m. popliteal artery were widely patent the right anterior tibial was occluded proximally and reconstitutes at the ankle. Peroneal artery was patent to the foot. Posterior tibial artery was occluded. The patient had angioplasty of the anterior tibial artery.. This was quite successful. He was recommended for Plavix as well as aspirin. 07/17/16; the patient was close to be a nurse visit today however the outer dressing of the Apligraf fell off. Noted drainage. I was  asked to see the wound. The patient is noted an odor however his wife had noted that. Drainage with Apligraf not necessarily a bad thing. He has not been systemically unwell 07/24/16; we are still have an issue with drainage of this wound. In spite of this I applied his second Apligraf. Medially the area still is probing to bone. 08/07/16; Apligraf reapplied in general wound looks improved. 08/21/16 Apligraf #4. Wound looks much better 09/04/16 patientt's wound again today continues to appear to improve with the application of the Apligraf's. He notes no increased discomfort or concerns at this point in time. 09/18/16; the patient returns today 2 weeks after his fifth application of Apligraf. Predictably three quarters of the width of this wound has healed. The deep area that probe to bone medially is still open. The patient asked how much out-of-pocket dollars would be for additional Apligraf's. 09/25/16; now using Hydrofera Blue. He has completed 5 Apligraf applications with considerable improvement in this deep open transmetatarsal amputation site. His wound is now a triangular-shaped wound on the medial aspect. At roughly 12 to 2:00 this probes another centimeter but as opposed to in the past this does not probe to bone. The patient has been seen at [facility] by Dr. Zenia Resides. He is not planning to do any more revascularization unless the wound stalls or worsens per the patient 10/02/16; 0.7 x 0.8 x 0.8. Unfortunately although the wound looks stable to improved. There is now easily probable bone. This hasn't been present for several weeks. Patient is not otherwise symptomatic he is not experiencing any pain. I did a culture of the wound bed 10/09/16. Deterioration last week. Culture grew MRSA and although there is improvement here with doxycycline prescribed over the phone I'm going to try to  get him linezolid 600 twice a day for 10 days today. 10/16/16; he is completing a weeks worth of linezolid and  still has 3 more days to go. Small triangular-shaped open area with some degree of undermining. There is still palpable bone with a curet. Overall the area appears better than last week 10/20/16 he has completed the linezolid still having some nausea and vomiting but no diarrhea. He has exposed bone this week which is a deterioration. 10/27/16 patient now has a small but probing wound down to bone. Culture of this bone that I did last week showed a few methicillin-resistant staph aureus. I have little doubt that this represents acute/subacute osteomyelitis. The patient is currently on Doxy which I will continue he also completed 10 days of linezolid. We are now in a difficult situation with this patient's foot after considerable discussion we will send him back to see Dr. Doran Durand for a surgical opinion of this I'm also going to try to arrange a infectious disease consult at [facility] hopefully week and get this prior to her usual 4-6 weeks we having Berino. The patient clearly is going to need 6 weeks of IV vancomycin. If we cannot arrange this expediently I'll have to consider ordering this myself through a home infusion company 11/03/16; the patient now has a small in terms of circumference but probing wound. No bone palpable today. The patient remains on doxycycline 100 twice a day which should support him until he sees infectious disease at [facility] next week the following week on Wednesday I believe he has an appointment with Dr. Andree Elk at [facility] who is his vascular cardiologist. Finally he has an appointment with Dr. Doran Durand on 11/22/16 we have been using silver alginate. The patient's wife states they are having trouble getting this through [facility] 11/13/16; the patient was seen by infectious disease at [facility] in the 11th PICC line placed in preparation for IV antibiotics. A tummy he has not going to get IV vancomycin o Ceftaroline. They've also ordered an MRI. Patient has a follow-up with Dr.  Doran Durand on 11/22/16 and Dr. Andree Elk at [facility] tomorrow 11/23/16 the patient is on daptomycin as directed by infectious disease at [facility]. He is also been back to see Dr. Andree Elk at [facility]. He underwent a repeat arteriogram. He had a successful PTA of the right anterior tibial artery. He is on dual antiplatelete treatment with Plavix and aspirin. Finally he had the MRI of his foot in [place]. This showed cellulitis about the foot worse distally edema and enhancement in the reminiscent of the second metatarsal was consistent with osteomyelitis therefore what I was assuming to be the first metatarsal may be actually the second. He also has a fluid collection deep to the calcaneus at the level of the calcaneal spur which could be an abscess or due to adventitial bursitis. He had a small tear in his Achilles 11/30/16; the patient continues on daptomycin as directed by infectious disease at [facility]. He is been revascularized by Dr. Andree Elk at [facility] in [place]. He has been to see Gretta Arab who was the orthopedic surgeon who did his original amputation. I have not seen his not however per the patient's wife he did not offer another surgical local surgical prodecure to remove involved bone. He verbalized his usual disbelief in not just hyperbarics but any medical therapy for this condition(osteomyelitis). I discussed this in detail with the patient today including answering the question about a BKA definitively "curing" the current condition. 12/07/16;  the patient continues on daptomycin as directed by infectious disease at [facility]. This is directed at the MRSA that we cultured from his bone debridement from 12/22. Lab work today shows a white count of 8.7 hemoglobin of 10.5 which is microcytic and hypochromic differential count shows a slightly elevated monocyte count at 1.2 eosinophilic count of 0.6. His creatinine is 1.11 sedimentation rate apparently is gone from 35-34 now 40. I explained was  wife I don't think this represents a trend. His total CK is 48 12/14/16- patient is here for follow-up evaluation of his right TMA site. He continues to receive IV daptomycin per infectious disease. His serum inflammatory markers remain elevated. He complains of intermittent pain to the medial aspect of the TMA site with intermittent erythema. He voices no complaints or concerns regarding hyperbaric therapy. Overall he and his wife are expressing a frustration and discouragement regarrding the length of time of treatment. 12/21/16; small open wound at roughly the first or second metatarsal metatarsalphalyngeal joint reminiscence of his transmetatarsal amputation site he continues to receive IV daptomycin per infectious disease at [facility]. He will finish these a week tomorrow. He has lab work which I been copied on. His white count is 10.8 hemoglobin 8.9 MCV is low at 75., MCH low at 24.5 platelet count slightly elevated at 626. Differential count shows 70% neutrophils 10% monocytes and 10% eosinophils. His comprehensive metabolic panel shows a slightly low sodium at 133 albumin low at 3.1 total CK is normal at 42 sedimentation rate is much higher at 82. He has had iron studies that show a serum iron of 15 and iron binding capacity of 238 and iron saturation of 6. This is suggestive of iron deficiency. B12 and folate were normal ferritin at 113 The patient tells me that he is not eating well and he has lost weight. He feels episodically nauseated. He is coughing and gagging on mucus which she thinks is sinusitis. He has an appointment with his primary doctor at 5:00 this afternoon in [facility] 01/02/17; the patient developed a subacute pneumonitis. He was admitted to [facility] after a CT scan showed an extensive interstitial pneumonitis [I have not yet seen this]. 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 He has since been discharged. He is not on oxygen. He feels fatigued and very short of breath with exertion. At [facility] the wound care nurse there felt that his wound was healed. He did complete his daptomycin and has follow-up with infectious disease on Friday. X-rays I did before he went to [facility] still suggested residual osteomyelitis in the anterior aspect of the second must metatarsal head. I'm not sure I would've expected any different. There was no other findings. I actually think I did this because of erythema over the first metatarsal head reminiscent 01/11/17; the patient has eosinophilic pneumonitis. He has been reviewed by pulmonology and given clearance for hyperbarics at least that's what his wife says. I'll need to see if there is note in care everywhere. Apparently the prognosis for improvement of daptomycin induced eosinophilic granulocyte is is 3 months without steroids. In the meantime infectious disease has placed him on doxycycline until the wound is closed. He is still is lost a lot of weight and his blood sugars are running in the mid 60s to low 80s fasting and at all times during the day 01/18/17; he has had adjustments in his insulin apparently his blood  sugars in the morning or over 100. He wants to restart his hyperbaric treatment we'll do this at 1:00. 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 I think there is good reason to complete his treatments in order to give him the best chance of maintaining a healed status and these DFU 3 wounds with MRSA infection in the bone 02/15/17; the patient was seen today in conjunction with HBO. He completed hyperbaric oxygen today. The open area on his transmetatarsal site has remained closed. There was an area of erythema when I saw him earlier in the week on the posterior heel although that is resolved as of today as well. He has been  using a cam walker. This is a patient who came to [place] after a transmetatarsal amputation that was necrotic and dehisced. He required revascularization percutaneously on 2 different occasions by Dr. Andree Elk of invasive cardiology at [facility]. 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 He went to [facility] infectious disease and had IV daptomycin for 5 weeks before developing eosinophilic pneumonitis and requiring an admission to hospital/ICU. He made a good recovery and is continued on doxycycline since. As mentioned his foot is closed now. He has a follow-up with Dr. Andree Elk tomorrow. He is going to [employer] on Monday for a custom-made shoe READMISSION Last visit Dr. Andree Elk [facility] 02/16/17 1. Critical limb ischemia of the RLE: s/p transmetatarsal amputation of the RLE, now completely healed. s/p right ATA percutaneous revascularization procedure x2, most recently 11/20/2016 s/p PTA of right AT 100% to less than 20% with a 2.5 x 200 balloon. He does have some residual osteomyelitis, but given the wound is closed, the orthopedist has recommended to follow. He has a fitting for a special shoe coming up next week. He has completed a course of daptomycin and doxycycline and he has been discharged by ID. He has completed a course of hyperbaric therapy. Continue Continue medical management with aspirin, Plavix and statin therapy. We will discuss ongoing Plavix therapy at follow-up in 6 months. 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 Given no symptoms, we will conservatively manage. He doesn't want to do any more invasive studies at this time, which is reasonable. The patient arrives today out of 2 concerns both on the right transmetatarsal site. 1 at the level of the  reminiscent fifth metatarsal head and the other at roughly the first or second. Both of these look like dark subcutaneous discoloration probably subdermal bleeding. He is recently obtained new adaptive footwear for the right foot. He also has a callus on the left fifth dorsal toe however he follows with podiatry for this and I don't think this is any issue. ABIs in this clinic today were 0.66 on the right and 1.06 on the left. On entrance into our clinic initially this was 0.95 and 0.92. He does not describe current claudication. They're going away on a cruise in 8 weeks and I think are trying to do the month is much as they can proactively. They follow with podiatry and have an appointment with Dr. Andree Elk in October READMISSION 06/25/18 This is a patient that we have not seen in almost a year. He is a type II diabetic with known PAD. He is followed by Dr.  Adams of interventional cardiology at [facility] in [place]. He is required revascularization for significant PAD. When we first saw him he required a transmetatarsal amputation. He had underlying osteomyelitis with a nonhealing surgical wound. This eventually closed with wound care, IV antibiotics and hyperbaric oxygen. They tell me that he has a modified shoe and he is very active walking up to 4 miles a day. He is followed by Dr. Geroge Baseman of podiatry. His wife states that he underwent a removal of callus over this site on July 9. She felt there may be drainage from this site after that although she could never really determined and there was callus buildup again. On 06/01/18 there was pressure bleeding through the overlying callus. he was given a prescription for 7 days of Bactrim. Fortuitously he has an appointment with Dr. Andree Elk on 06/04/18 and he immediately underwent revascularization of the right leg although I have not had a chance to review these records in care everywhere. This was apparently done through anterior and retrograde access.  On 06/06/18 he had another debridement by Dr. Bernette Mayers. Vitamin soaking with Epsom salts for 20 minutes and applying calcium alginate. The original trans-met was on April 2017 I believe by Dr. Doran Durand. His ABI in our clinic was noncompressible today. 07/02/18; x-ray I ordered last week was negative for osteomyelitis. Swab culture was also negative. He is going to require an MRI which I have ordered today. 07/09/18; surprisingly the MRI of the foot that I ordered did not show osteomyelitis. He did suggest the possibility of cellulitis. For this reason I'll go ahead and give him a 10 day course of doxycycline. Although the previous culture of this area was negative 07/16/18; it arrives with the wound looking much the same. Roughly the same depth. He has thick subcutaneous tissue around the wound orifice but this still has roughly the same depth. Been using silver alginate. We applied Oasis #1 today 07/23/2018; still having to remove a lot of callus and thick subcutaneous tissue to actually define the wound here. Most of this seems to have closed down yet he has a comma shaped divot over the top of the area that still I think is open. We applied Oasis #2 His wife expressed concern about the tip of his left great toe. This almost looks like a small blister. She also showed it today to her podiatrist Dr. Geroge Baseman who did not think this was anything serious. I am not sure is anything serious either however I think it bears some watching. He is not in any pain however he is insensate 07/30/2018;; still on a lot of nonviable tissue over the surface of the wound however cleaning this up reveals a more substantial wound orifice but less of a probing wound depth. This is not probed to bone. There is no evidence of infection The wife is still concerned about a non-open area on the tip of his left great toe. Almost feels like a bony outgrowth. She had previously showed this to podiatry. I do not think this is a blister.  A friction area would be possible although he is really not walking according to his wife. He had a small skin tag on his right buttock but no open wound here either 08/06/2018; we applied a third Oasis last week. Unfortunately there is really no improvement. Still requiring extensive debridement to expose the wound bed from a horizontal slitlike depression. I still have not been able to get this to fill in properly. On the positive  side there is now no probable bone from when he first came into the facility. I changed him to silver alginate today after a reasonably aggressive debridement 08/13/2018; once again the patient comes in with skin and subcutaneous tissue closing over the small probing area with the underlying cavity of the wound on the right TMA site. I applied silver alginate to this last week. Prior to that we used Oasis x3 still not able to get this area granulating. He does not have a probing area of the bone which is an improvement from when I spur started working on this and an MRI did not suggest osteomyelitis. I do not see evidence of infection here but I am increasingly concerned about why I cannot get this area to granulate. Each time I debrided this is looks like this is simply a matter of getting granulation to fill in the hole and then getting epithelialization. This does not seem to happen Also I sent him back to see podiatry Dr. Earleen Newport about the what felt to be bony outgrowth on the tip of his left great toe. Apparently after heel he left the clinic last week or the next day he developed a blood blister. He did see Dr. Earleen Newport. He went on to have a debridement of the medial nail cuticle he now has an open area here as well as some denuded skin. They did an x-ray apparently does have a bony outgrowth or spur but I am not able to look at this. They are using topical antibiotics apparently there was some suggested he use Santyl. Patient's wife was anxious for my opinion of  this 08/20/2018; we are able to keep the wound open this time instead of the thick subcutaneous tissue closing over the top of it however unfortunately once again this probes to bone. I do not see any evidence of infection and previous MRI did not show osteomyelitis. I elected to go back to the Oasis to see if we can stimulate some granulation. Last saw his vascular interventional cardiologist Dr. Andree Elk at the beginning of August and he had a repeat procedure. Nevertheless I wonder how much blood flow he has down to this area With regards to the left first toe he is seeing Dr. Earleen Newport next week. They are applying Bactroban to this area. 08/27/2018 Once again he comes in with thick eschar and subcutaneous tissue over the top of the small probing hole. This does not appear to go down to bone but it still has roughly the same depth. I reapplied Oasis today Over the left great toe there appears to be more of the wound at the tip of his toe than there was last week. This has an eschar on the surface of it. Will change to Santyl. There is seeing podiatry this afternoon 09/03/2018 He comes in today with the area on the transmetatarsal's site with a fair amount of callus, nonviable tissue over the circumference but it was not closed. I removed all of this as well as some subcutaneous debris and reapplied Oasis. There is no exposed bone The area over the tip of the left great toe started off as a nodule of uncertain etiology. He has been followed with podiatry. They have been removing part of the medial nail bed. He has nonviable tissue over the wound he been using Santyl in this area 09/10/18 Unfortunately comes in with neither wound area looking improved. The transmetatarsal amputation site once Again has nonviable debris over the surface requiring debridement. Unfortunately underneath  this there is nothing that looks viable and this once again goes right down to bone. There is no purulent drainage and  no erythema. Also the surgical wound from podiatry on the left first toe has an ischemic-looking eschar over the surface of the tip of the toe I have elected not to attempt candidly debride this His wife as arranged for him to have follow-up noninvasive studies in [place] under the care of Dr. Andree Elk clinic. The question is he has known severe PAD. They had recently seen him in August. He has had revascularizations in both legs within the last 4 or 5 months. He is not complaining of pain and I cannot really get a history of claudication. He has not systemically unwell 09/20/2018 the patient has been to [facility] and been revascularized by Dr. Andree Elk earlier this week. Apparently he was able to open up the anterior tibial artery although I have not actually seen his formal report. He is going for an attempt to revascularize on the left on Monday. He is apparently working with an investigational stent for lower extremity arteries below the knee and he has talked to the patient about placing that on Monday if possible. The patient has been using silver alginate on the transmetatarsal amputation site and Santyl on the left 09/30/2018; patient had his revascularization on the left this apparently included a standard approach as well as a more distal arterial catheterization although I do not have any information on this from Dr. Andree Elk. In fact I do not even see the initial revascularization that he had on the right. I have included the arterial history from Dr. Andree Elk last note however below; ASSESSMENT/PLAN: 1. Hx of Critical limb ischemia bilateral lower extremities, PAD: -s/p transmetatarsal amputation of the RLE. -s/p right ATA percutaneous revascularization procedure x2, most recently 11/20/2016 s/p PTA of right AT 100% to less than 20% with a 2.5 x 200 balloon. 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 -03/21/2018 s/p PTA of 90% left popliteal artery to <10% with a 5x20 cutting balloon, PTA of 90% left peroneal to <20% with a 3x20 balloon, PTA of 100% left AT to <20% with a 2.5x220 balloon. 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 Will plan on the LLE soon after. Risks/benefits of procedure have been discussed and patient has elected to proceed. We have been using endoform to the right TMA amputation site wound and Santyl to the left great toe 10/07/2018; the area on the tip of his left great toe looked better we have been using Santyl here. We continue to have a very difficult probing hole on the right TMA amputation site we have been using endoform. I went on to use his fifth Oasis today 10/14/2018; the tip of the left great toe continues to look better. We have been using Santyl here the surface however is healthy and I think we can change to an alginate. The right TMA has not changed. Once again he has no superficial opening there is callus and thick subcutaneous tissue over the orifice once you remove this there is the probing area that we have been dealing with without too much change. There is no palpable bone I have been placing Oasis here and put Oasis #  6 in this today after a more vigorous debridement 10/21/18; the left great toe still has necrotic surface requiring debridement. The right TMA site hasn't changed in view of the thick callus over the wound bed. With removal of this there is still the opening however this does not appear to have the same depth. Again there is no palpable bone. Oasis was replaced 10/28/2018; patient comes in with both wounds looking worse. The area over the first toe tip is now down to bone. The area over the TMA site is deeper and down to bone clearly with a increase in  overall wound area. Equally concerning on the right TMA is the complete absence of a pulse this week which is a change. We are not even able to Doppler this. On the right he has a noncompressible ABI greater than 1.4 11/04/2018. Both wounds look somewhat worse. X-rays showed no osteomyelitis of the right foot but on the left there was underlying osteomyelitis in the left great toe distal phalanx. I been on the phone to Dr. Andree Elk surface at [facility] in [place] and they are arranging for another angiogram on the right on Monday. I have him on doxycycline for the osteomyelitis in the left great toe for now. Infectious disease may be necessary 1/17; 2-week hiatus. Patient was admitted to hospital at [facility]. My understanding is he underwent an angioplasty of the right anterior tibial artery and had stents placed in the left anterior artery and the left tibial peroneal trunk. This was done by Dr. Andree Elk of interventional radiology. There is no major change in either 1 of the wounds. They have been using Aquacel Ag. As far as they are aware no imaging studies were done of the foot which is indeed unfortunate. I had him on doxycycline for 2 weeks since we identified the osteomyelitis in the left great toe by plain x-ray. I have renewed that again today. I am still suspicious about osteomyelitis in the amputation site and would consider doing another MRI to compare with the one done in September. The idea of hyperbaric oxygen certainly comes up for discussion 1/24; no major change in either wound area. I have him on doxycycline for osteomyelitis at the tip of the left great toe. As noted he has been previously and recently revascularized by Dr. Andree Elk at [place]. He is tolerating the doxycycline well. For some reason we do not have an infectious disease consult yet. Culture of drainage from the right foot site last week was negative 1/31; MRI of the right foot did not show osteomyelitis of the right ankle and  foot. Notable for a skin ulceration overlying the second metatarsal stump with generalizing soft tissue edema of the ankle and foot consistent with cellulitis. Noted to have a partial-thickness tear of the Achilles tendon 7.5 cm proximal to the insertion Nothing really new in terms of symptoms. Patient's area on the tip of the left great toe is just about closed although he still has the probing area on the metatarsal amputation site. Appointment with Dr. Linus Salmons of infectious disease next week 2/7; Dr. Novella Olive did not feel that any further antibiotics were necessary he would follow-up in 2 months. The left great toe appears to be closed still some surface callus that I gently looked under high did not see anything open or anything that was threatening to be open. He still has the open area on the mid part of his TMA site using endoform 2/14; left great toe is closed and he  is completing his doxycycline as of last Sunday. He is not on any antibiotics. Unfortunately out of the right foot his wife noticed some subdermal hemorrhage this week. He had been walking 2 miles I had given him permission to do so this is not really a plantar wound. Using endoform to this wound but I changed to silver alginate this week 2/21; left great toe remains closed. Culture last week grew Streptococcus angiosis which I am not really familiar with however it is penicillin sensitive and I am going to put him on Augmentin. Not much change in the wound on the right foot the deep area is still probing precariously close to bone and the wound on the margin of the TMA is larger. 2/28; left great toe remains closed. He is completing the Augmentin I gave him last week. 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 This is being shown to Dr. Andree Elk at [place] to see if there is anything else that can be done here. He has been using silver alginate strips on the right 3/6; left great toe remains closed.  He sees Dr. Andree Elk on Monday. The area on the plantar aspect of the right foot has the same small orifice with thick callused tissue around this. However this time with removal of the callus tissue the wound is open all the way along the incision line to the end medially. We have been using silver alginate. 3/13; left toe remains closed although the area is callused. He sees Dr. Jacqualyn Posey of podiatry next week. The area on the plantar right foot looked a lot better this week. Culture I did of this was negative we use silver alginate. He is going next week for an attempt at revascularization by Dr. Andree Elk 3/23; left toe remains closed although the area is callused. He will see Dr. Jacqualyn Posey in follow-up. The area on the right plantar foot continues to look surprisingly better over the last 3 visits. We have been using silver alginate. The revascularization he was supposed to have by Dr. Andree Elk at [facility] in [place] has been canceled [facility] procedure] 4/6; the right foot remains closed albeit callused. Podiatry canceled the appointment with regards to the left great toe. He has not seen Dr. Andree Elk at [facility] but thinks that Dr. Andree Elk has "done all he can do". He would be a candidate for the hemostaemix trial Readmission 07/29/2019 Mr. Stann Mainland is a man we know well from at least 3 previous stays in this clinic. He is a type II diabetic with severe PAD followed by Dr. Andree Elk at [facility] in [place]. 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 So far everything in both of these areas has remained closed. About 2 weeks ago he went to see his podiatrist [facility] Dr. Babs Bertin. He had a thick callus on the left fifth metatarsal head that was shaved. He has developed an open wound in this area. They have been offloading this in his diabetic shoes. The patient has not had  any more revascularizations by Dr. Andree Elk since the last time he was here. ABI in our clinic at the posterior tibial on the left was 1.06 10/6; no real change in the area on the plantar met head. Small wound with 2 mm of depth. He has thick skin probably from pressure around  the wound. We have been using silver alginate 10/13; small wound in the left fifth plantar met head. Arrives today with undermining laterally and purulent drainage. Our intake nurse cultured this. His wife stated they noticed a change in color over the last day or 2. He is not systemically unwell Electronic Signature(s) Signed: 08/12/2019 5:42:33 PM By: [name] MD Entered By: [name] on 08/12/2019 14:17:34 -------------------------------------------------------------------------------- Physical Exam Details Patient Name: Date of Service: [name], [name] 08/12/2019 1:15 PM Medical Record [id number] Patient Account Number: [id number] Date of Birth/Sex: Treating RN: December 09, 1945 (72 y.o. [name] Primary Care Provider: [name] Other Clinician: Referring Provider: Treating Provider/Extender:[name], [name], [name] in Treatment: 2 Constitutional Sitting or standing Blood Pressure is within target range for patient.. Pulse regular and within target range for patient.Marland Kitchen Respirations regular, non-labored and within target range.. Temperature is normal and within the target range for the patient.Marland Kitchen Appears in no distress. Respiratory work of breathing is normal. Integumentary (Hair, Skin) Slight amount of erythema around the wound in the lateral fifth met head on the left. Notes Wound exam; the patient's wound did not look too much different however there was undermining with purulent drainage laterally over the lateral part of the fifth metatarsal head. Using pickups and a #15 scalpel that skin was removed. Purulent drainage was cultured by our intake nurse. There is scant amount  of erythema Electronic Signature(s) Signed: 08/12/2019 5:42:33 PM By: [name] MD Entered By: [name] on 08/12/2019 14:20:17 -------------------------------------------------------------------------------- Physician Orders Details Patient Name: Date of Service: XACHARY, HAMBLY 08/12/2019 1:15 PM Medical Record [id number] Patient Account Number: [id number] Date of Birth/Sex: Treating RN: [birth year] (72 y.o. [name]) [name] Primary Care Provider: [name] Other Clinician: Referring Provider: Treating Provider/Extender:[name], [name], [name] in Treatment: 2 Verbal / Phone Orders: No Diagnosis Coding ICD-10 Coding Code Description E11.621 Type 2 diabetes mellitus with foot ulcer E11.51 Type 2 diabetes mellitus with diabetic peripheral angiopathy without gangrene L97.521 Non-pressure chronic ulcer of other part of left foot limited to breakdown of skin Follow-up Appointments Return Appointment in 1 week. Dressing Change Frequency Change dressing every day. Wound Cleansing Wound #5 Left Metatarsal head fifth Clean wound with Wound Cleanser Primary Wound Dressing Wound #5 Left Metatarsal head fifth Calcium Alginate with Silver Secondary Dressing Wound #5 Left Metatarsal head fifth Kerlix/Rolled Gauze - secure with tape Dry Gauze Other: - felt to cushion shoe Off-Loading Other: - surgical shoe with felt Laboratory Bacteria identified in Unspecified specimen by Anaerobe culture (MICRO) - non healing wound - (ICD10 E11.621 - Type 2 diabetes mellitus with foot ulcer) LOINC Code: 635-3 Convenience Name: Anerobic culture Patient Medications Allergies: daptomycin Notifications Medication Indication Start End doxycycline monohydrate wound infection 08/12/2019 left foot DOSE oral 100 mg capsule - 1 capsule oral bid for 7 days Electronic Signature(s) Signed: 08/12/2019 2:24:06 PM By: [name] MD Entered By: [name] on 08/12/2019  14:24:05 -------------------------------------------------------------------------------- Problem List Details Patient Name: Date of Service: LEVAN, ALOIA 08/12/2019 1:15 PM Medical Record [id number] Patient Account Number: [id number] Date of Birth/Sex: Treating RN: April 17, 1946 (72 y.o. [name] Primary Care Provider: [name] Other Clinician: Referring Provider: Treating Provider/Extender:[name], [name], [name] in Treatment: 2 Active Problems ICD-10 Evaluated Encounter Code Description Active Date Today Diagnosis E11.621 Type 2 diabetes mellitus with foot ulcer 07/29/2019 No Yes E11.51 Type 2 diabetes mellitus with diabetic peripheral 07/29/2019 No Yes angiopathy without gangrene L97.521 Non-pressure chronic ulcer of other part of left foot 07/29/2019 No Yes limited to  breakdown of skin L03.116 Cellulitis of left lower limb 08/12/2019 No Yes Inactive Problems Resolved Problems Electronic Signature(s) Signed: 08/12/2019 5:42:33 PM By: [name] MD Entered By: [name] on 08/12/2019 14:16:39 -------------------------------------------------------------------------------- Progress Note Details Patient Name: Date of Service: [name], [name] 08/12/2019 1:15 PM Medical Record [id number] Patient Account Number: [id number] Date of Birth/Sex: Treating RN: 04-18-1946 (72 y.o. [name] Primary Care Provider: [name] Other Clinician: Referring Provider: Treating Provider/Extender:[name], [name], [name] in Treatment: 2 Subjective History of Present Illness (HPI) 05/18/16; this is a 73year-old diabetic who is a type II diabetic on insulin. The history is that he traumatized his right foot developed a sore sometime in late March. Shortly thereafter he went on a cruise but he had to get off the cruise ship in [place] and fly urgently back to [place] where he was admitted to [facility] and ultimately underwent  a transmetatarsal amputation by Dr. 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 According to the patient and his wife this wound never really healed. He was seen on 2 occasions in the wound care center in [place] and had vascular studies and then was referred urgently to Dr. Bridgett Larsson of vascular surgery. He underwent an angiogram on 05/10/16. Unfortunately nothing really could be done to improve his vascular status. He had a 75-90% stenosis in the midsegment of 1 segment of the posterior femoral artery. He had a patent popliteal, his anterior tibial occluded shortly after takeoff. Perineal had a greater than 90% stenosis posterior tibial is occluded feet had no distal collaterals feed distal aspect of the transmetatarsal amputation site. The patient tells me that he had a prolonged period of Santyl by Dr. Doran Durand was some initial improvement but then this was stopped. I think they're only applying daily dressings/dry dressings. He has not had a recent x-ray of the right foot he did have one before his surgery in April. His wife by the dimensions of the wound/surgical site being followed at home since 4/20. At that point the dimensions were 0.5 x 12 x 0.2 on 7/19 this was 1.8 x 6 x 0.4. He is not currently on any antibiotics. His hemoglobin A1c in early April was 12.9 at that point he was started on insulin. Apparently his blood sugars are much lower he has an appointment with Dr. Legrand Como Alteimer of endocrine next week. 05/29/16 x-ray of the area did not show osteomyelitis. I think he probably needs an MRI at this point. His wife is asking about something called"Yireh" cream which is not FDA approved. I have not heard of this. 06/22/16; MRI did not really suggest osteomyelitis. There was minimal marrow edema and enhancement in the stump of the second metatarsal felt to be secondary likely to postoperative change rather than osteomyelitis. The patient has arranged his own consultation with Dr. Andree Elk at  [place], apparently their daughter lives in [place] and has some connection here. Any improvement in vascular supply by Dr. Andree Elk would of course be helpful. Dr. Bridgett Larsson did not feel that anything further could be done other than amputation if wound care did not result in healing or if the area deteriorates. 06/26/16; the patient has been to see Dr. Andree Elk at [place] and had an angiogram. He is going for a procedure on Thursday which will involve catheterization. I'm not sure if this is an anterograde or retrograde approach. He has been using Santyl to the wound 07/10/16; the patient had a repeat angiogram and angioplasty at [place] by Dr. Brunetta Jeans.  His angiogram showed right CFA and profundal widely patent. The right as of a.m. popliteal artery were widely patent the right anterior tibial was occluded proximally and reconstitutes at the ankle. Peroneal artery was patent to the foot. Posterior tibial artery was occluded. The patient had angioplasty of the anterior tibial artery.. This was quite successful. He was recommended for Plavix as well as aspirin. 07/17/16; the patient was close to be a nurse visit today however the outer dressing of the Apligraf fell off. Noted drainage. I was asked to see the wound. The patient is noted an odor however his wife had noted that. Drainage with Apligraf not necessarily a bad thing. He has not been systemically unwell 07/24/16; we are still have an issue with drainage of this wound. In spite of this I applied his second Apligraf. Medially the area still is probing to bone. 08/07/16; Apligraf reapplied in general wound looks improved. 08/21/16 Apligraf #4. Wound looks much better 09/04/16 patientt's wound again today continues to appear to improve with the application of the Apligraf's. He notes no increased discomfort or concerns at this point in time. 09/18/16; the patient returns today 2 weeks after his fifth application of Apligraf. Predictably three quarters of  the width of this wound has healed. The deep area that probe to bone medially is still open. The patient asked how much out-of-pocket dollars would be for additional Apligraf's. 09/25/16; now using Hydrofera Blue. He has completed 5 Apligraf applications with considerable improvement in this deep open transmetatarsal amputation site. His wound is now a triangular-shaped wound on the medial aspect. At roughly 12 to 2:00 this probes another centimeter but as opposed to in the past this does not probe to bone. The patient has been seen at [place] by Dr. Zenia Resides. He is not planning to do any more revascularization unless the wound stalls or worsens per the patient 10/02/16; 0.7 x 0.8 x 0.8. Unfortunately although the wound looks stable to improved. There is now easily probable bone. This hasn't been present for several weeks. Patient is not otherwise symptomatic he is not experiencing any pain. I did a culture of the wound bed 10/09/16. Deterioration last week. Culture grew MRSA and although there is improvement here with doxycycline prescribed over the phone I'm going to try to get him linezolid 600 twice a day for 10 days today. 10/16/16; he is completing a weeks worth of linezolid and still has 3 more days to go. Small triangular-shaped open area with some degree of undermining. There is still palpable bone with a curet. Overall the area appears better than last week 10/20/16 he has completed the linezolid still having some nausea and vomiting but no diarrhea. He has exposed bone this week which is a deterioration. 10/27/16 patient now has a small but probing wound down to bone. Culture of this bone that I did last week showed a few methicillin-resistant staph aureus. I have little doubt that this represents acute/subacute osteomyelitis. The patient is currently on Doxy which I will continue he also completed 10 days of linezolid. We are now in a difficult situation with this patient's foot after  considerable discussion we will send him back to see Dr. Doran Durand for a surgical opinion of this I'm also going to try to arrange a infectious disease consult at [facility] hopefully week and get this prior to her usual 4-6 weeks we having [place]. The patient clearly is going to need 6 weeks of IV vancomycin. If we cannot arrange this expediently I'll have  to consider ordering this myself through a home infusion company 11/03/16; the patient now has a small in terms of circumference but probing wound. No bone palpable today. The patient remains on doxycycline 100 twice a day which should support him until he sees infectious disease at [facility] next week the following week on Wednesday I believe he has an appointment with Dr. Andree Elk at [facility] who is his vascular cardiologist. Finally he has an appointment with Dr. Doran Durand on 11/22/16 we have been using silver alginate. The patient's wife states they are having trouble getting this through [facility] 11/13/16; the patient was seen by infectious disease at [facility] in the 11th PICC line placed in preparation for IV antibiotics. A tummy he has not going to get IV vancomycin o Ceftaroline. They've also ordered an MRI. Patient has a follow-up with Dr. Doran Durand on 11/22/16 and Dr. Andree Elk at [facility] tomorrow 11/23/16 the patient is on daptomycin as directed by infectious disease at [facility]. He is also been back to see Dr. Andree Elk at [facility]. He underwent a repeat arteriogram. He had a successful PTA of the right anterior tibial artery. He is on dual antiplatelete treatment with Plavix and aspirin. Finally he had the MRI of his foot in [place]. This showed cellulitis about the foot worse distally edema and enhancement in the reminiscent of the second metatarsal was consistent with osteomyelitis therefore what I was assuming to be the first metatarsal may be actually the second. He also has a fluid collection deep to the calcaneus at the level of the calcaneal  spur which could be an abscess or due to adventitial bursitis. He had a small tear in his Achilles 11/30/16; the patient continues on daptomycin as directed by infectious disease at [facility]. He is been revascularized by Dr. Andree Elk at [facility] in [place]. He has been to see Gretta Arab who was the orthopedic surgeon who did his original amputation. I have not seen his not however per the patient's wife he did not offer another surgical local surgical prodecure to remove involved bone. He verbalized his usual disbelief in not just hyperbarics but any medical therapy for this condition(osteomyelitis). I discussed this in detail with the patient today including answering the question about a BKA definitively "curing" the current condition. 12/07/16; the patient continues on daptomycin as directed by infectious disease at [facility]. This is directed at the MRSA that we cultured from his bone debridement from 12/22. Lab work today shows a white count of 8.7 hemoglobin of 10.5 which is microcytic and hypochromic differential count shows a slightly elevated monocyte count at 1.2 eosinophilic count of 0.6. His creatinine is 1.11 sedimentation rate apparently is gone from 35-34 now 40. I explained was wife I don't think this represents a trend. His total CK is 48 12/14/16- patient is here for follow-up evaluation of his right TMA site. He continues to receive IV daptomycin per infectious disease. His serum inflammatory markers remain elevated. He complains of intermittent pain to the medial aspect of the TMA site with intermittent erythema. He voices no complaints or concerns regarding hyperbaric therapy. Overall he and his wife are expressing a frustration and discouragement regarrding the length of time of treatment. 12/21/16; small open wound at roughly the first or second metatarsal metatarsalphalyngeal joint reminiscence of his transmetatarsal amputation site he continues to receive IV daptomycin per  infectious disease at [facility]. He will finish these a week tomorrow. He has lab work which I been copied on. His white count is  10.8 hemoglobin 8.9 MCV is low at 75., MCH low at 24.5 platelet count slightly elevated at 626. Differential count shows 70% neutrophils 10% monocytes and 10% eosinophils. His comprehensive metabolic panel shows a slightly low sodium at 133 albumin low at 3.1 total CK is normal at 42 sedimentation rate is much higher at 82. He has had iron studies that show a serum iron of 15 and iron binding capacity of 238 and iron saturation of 6. This is suggestive of iron deficiency. B12 and folate were normal ferritin at 113 The patient tells me that he is not eating well and he has lost weight. He feels episodically nauseated. He is coughing and gagging on mucus which she thinks is sinusitis. He has an appointment with his primary doctor at 5:00 this afternoon in [facility] 01/02/17; the patient developed a subacute pneumonitis. He was admitted to [facility] after a CT scan showed an extensive interstitial pneumonitis [I have not yet seen this]. 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 He has since been discharged. He is not on oxygen. He feels fatigued and very short of breath with exertion. At [facility] the wound care nurse there felt that his wound was healed. He did complete his daptomycin and has follow-up with infectious disease on Friday. X-rays I did before he went to [facility] still suggested residual osteomyelitis in the anterior aspect of the second must metatarsal head. I'm not sure I would've expected any different. There was no other findings. I actually think I did this because of erythema over the first metatarsal head reminiscent 01/11/17; the patient has eosinophilic pneumonitis. He has been reviewed by pulmonology and given clearance for hyperbarics at least  that's what his wife says. I'll need to see if there is note in care everywhere. Apparently the prognosis for improvement of daptomycin induced eosinophilic granulocyte is is 3 months without steroids. In the meantime infectious disease has placed him on doxycycline until the wound is closed. He is still is lost a lot of weight and his blood sugars are running in the mid 60s to low 80s fasting and at all times during the day 01/18/17; he has had adjustments in his insulin apparently his blood sugars in the morning or over 100. He wants to restart his hyperbaric treatment we'll do this at 1:00. 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 I think there is good reason to complete his treatments in order to give him the best chance of maintaining a healed status and these DFU 3 wounds with MRSA infection in the bone 02/15/17; the patient was seen today in conjunction with HBO. He completed hyperbaric oxygen today. The open area on his transmetatarsal site has remained closed. There was an area of erythema when I saw him earlier in the week on the posterior heel although that is resolved as of today as well. He has been using a cam walker. This is a patient who came to [place] after a transmetatarsal amputation that was necrotic and dehisced. He required revascularization percutaneously on 2 different occasions by Dr. Andree Elk of invasive cardiology at [facility]. 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 He went to [facility] infectious disease and had IV daptomycin for 5 weeks before developing eosinophilic pneumonitis and requiring an admission to hospital/ICU. He  made a good recovery and is continued on doxycycline since. As mentioned his foot is closed now. He has a follow-up with Dr. Andree Elk tomorrow. He is going to [employer] on Monday for  a custom-made shoe READMISSION Last visit Dr. Andree Elk [facility] 02/16/17 1. Critical limb ischemia of the RLE:  s/p transmetatarsal amputation of the RLE, now completely healed.  s/p right ATA percutaneous revascularization procedure x2, most recently 11/20/2016 s/p PTA of right AT 100% to less than 20% with a 2.5 x 200 balloon.  He does have some residual osteomyelitis, but given the wound is closed, the orthopedist has recommended to follow. He has a fitting for a special shoe coming up next week. He has completed a course of daptomycin and doxycycline and he has been discharged by ID. He has completed a course of hyperbaric therapy.  Continue Continue medical management with aspirin, Plavix and statin therapy. We will discuss ongoing Plavix therapy at follow-up in 6 months.  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 Given no symptoms, we will conservatively manage. He doesn't want to do any more invasive studies at this time, which is reasonable. The patient arrives today out of 2 concerns both on the right transmetatarsal site. 1 at the level of the reminiscent fifth metatarsal head and the other at roughly the first or second. Both of these look like dark subcutaneous discoloration probably subdermal bleeding. He is recently obtained new adaptive footwear for the right foot. He also has a callus on the left fifth dorsal toe however he follows with podiatry for this and I don't think this is any issue. ABIs in this clinic today were 0.66 on the right and 1.06 on the left. On entrance into our clinic initially this was 0.95 and 0.92. He does not describe current claudication. They're going away on a cruise in 8 weeks and I think are trying to do the month is much as they can proactively. They follow with podiatry and have an appointment with  Dr. Andree Elk in October READMISSION 06/25/18 This is a patient that we have not seen in almost a year. He is a type II diabetic with known PAD. He is followed by Dr. Andree Elk of interventional cardiology at [facility] in [place]. He is required revascularization for significant PAD. When we first saw him he required a transmetatarsal amputation. He had underlying osteomyelitis with a nonhealing surgical wound. This eventually closed with wound care, IV antibiotics and hyperbaric oxygen. They tell me that he has a modified shoe and he is very active walking up to 4 miles a day. He is followed by Dr. Geroge Baseman of podiatry. His wife states that he underwent a removal of callus over this site on July 9. She felt there may be drainage from this site after that although she could never really determined and there was callus buildup again. On 06/01/18 there was pressure bleeding through the overlying callus. he was given a prescription for 7 days of Bactrim. Fortuitously he has an appointment with Dr. Andree Elk on 06/04/18 and he immediately underwent revascularization of the right leg although I have not had a chance to review these records in care everywhere. This was apparently done through anterior and retrograde access. On 06/06/18 he had another debridement by Dr. Bernette Mayers. Vitamin soaking with Epsom salts for 20 minutes and applying calcium alginate. The original trans-met was on April  2017 I believe by Dr. Doran Durand. His ABI in our clinic was noncompressible today. 07/02/18; x-ray I ordered last week was negative for osteomyelitis. Swab culture was also negative. He is going to require an MRI which I have ordered today. 07/09/18; surprisingly the MRI of the foot that I ordered did not show osteomyelitis. He did suggest the possibility of cellulitis. For this reason I'll go ahead and give him a 10 day course of doxycycline. Although the previous culture of this area was negative 07/16/18; it arrives with the wound  looking much the same. Roughly the same depth. He has thick subcutaneous tissue around the wound orifice but this still has roughly the same depth. Been using silver alginate. We applied Oasis #1 today 07/23/2018; still having to remove a lot of callus and thick subcutaneous tissue to actually define the wound here. Most of this seems to have closed down yet he has a comma shaped divot over the top of the area that still I think is open. We applied Oasis #2 His wife expressed concern about the tip of his left great toe. This almost looks like a small blister. She also showed it today to her podiatrist Dr. Geroge Baseman who did not think this was anything serious. I am not sure is anything serious either however I think it bears some watching. He is not in any pain however he is insensate 07/30/2018;; still on a lot of nonviable tissue over the surface of the wound however cleaning this up reveals a more substantial wound orifice but less of a probing wound depth. This is not probed to bone. There is no evidence of infection The wife is still concerned about a non-open area on the tip of his left great toe. Almost feels like a bony outgrowth. She had previously showed this to podiatry. I do not think this is a blister. A friction area would be possible although he is really not walking according to his wife. He had a small skin tag on his right buttock but no open wound here either 08/06/2018; we applied a third Oasis last week. Unfortunately there is really no improvement. Still requiring extensive debridement to expose the wound bed from a horizontal slitlike depression. I still have not been able to get this to fill in properly. On the positive side there is now no probable bone from when he first came into the facility. I changed him to silver alginate today after a reasonably aggressive debridement 08/13/2018; once again the patient comes in with skin and subcutaneous tissue closing over the small  probing area with the underlying cavity of the wound on the right TMA site. I applied silver alginate to this last week. Prior to that we used Oasis x3 still not able to get this area granulating. He does not have a probing area of the bone which is an improvement from when I spur started working on this and an MRI did not suggest osteomyelitis. I do not see evidence of infection here but I am increasingly concerned about why I cannot get this area to granulate. Each time I debrided this is looks like this is simply a matter of getting granulation to fill in the hole and then getting epithelialization. This does not seem to happen Also I sent him back to see podiatry Dr. Earleen Newport about the what felt to be bony outgrowth on the tip of his left great toe. Apparently after heel he left the clinic last week or the next day he developed  a blood blister. He did see Dr. Earleen Newport. He went on to have a debridement of the medial nail cuticle he now has an open area here as well as some denuded skin. They did an x-ray apparently does have a bony outgrowth or spur but I am not able to look at this. They are using topical antibiotics apparently there was some suggested he use Santyl. Patient's wife was anxious for my opinion of this 08/20/2018; we are able to keep the wound open this time instead of the thick subcutaneous tissue closing over the top of it however unfortunately once again this probes to bone. I do not see any evidence of infection and previous MRI did not show osteomyelitis. I elected to go back to the Oasis to see if we can stimulate some granulation. Last saw his vascular interventional cardiologist Dr. Andree Elk at the beginning of August and he had a repeat procedure. Nevertheless I wonder how much blood flow he has down to this area With regards to the left first toe he is seeing Dr. Earleen Newport next week. They are applying Bactroban to this area. 08/27/2018 ooOnce again he comes in with thick eschar  and subcutaneous tissue over the top of the small probing hole. This does not appear to go down to bone but it still has roughly the same depth. I reapplied Oasis today ooOver the left great toe there appears to be more of the wound at the tip of his toe than there was last week. This has an eschar on the surface of it. Will change to Santyl. There is seeing podiatry this afternoon 09/03/2018 ooHe comes in today with the area on the transmetatarsal's site with a fair amount of callus, nonviable tissue over the circumference but it was not closed. I removed all of this as well as some subcutaneous debris and reapplied Oasis. There is no exposed bone ooThe area over the tip of the left great toe started off as a nodule of uncertain etiology. He has been followed with podiatry. They have been removing part of the medial nail bed. He has nonviable tissue over the wound he been using Santyl in this area 09/10/18 ooUnfortunately comes in with neither wound area looking improved. The transmetatarsal amputation site once Again has nonviable debris over the surface requiring debridement. Unfortunately underneath this there is nothing that looks viable and this once again goes right down to bone. There is no purulent drainage and no erythema. ooAlso the surgical wound from podiatry on the left first toe has an ischemic-looking eschar over the surface of the tip of the toe I have elected not to attempt candidly debride this ooHis wife as arranged for him to have follow-up noninvasive studies in [place] under the care of Dr. Andree Elk clinic. The question is he has known severe PAD. They had recently seen him in August. He has had revascularizations in both legs within the last 4 or 5 months. He is not complaining of pain and I cannot really get a history of claudication. He has not systemically unwell 09/20/2018 the patient has been to [facility] and been revascularized by Dr. Andree Elk earlier this  week. Apparently he was able to open up the anterior tibial artery although I have not actually seen his formal report. He is going for an attempt to revascularize on the left on Monday. He is apparently working with an investigational stent for lower extremity arteries below the knee and he has talked to the patient about placing that on  Monday if possible. The patient has been using silver alginate on the transmetatarsal amputation site and Santyl on the left 09/30/2018; patient had his revascularization on the left this apparently included a standard approach as well as a more distal arterial catheterization although I do not have any information on this from Dr. Andree Elk. In fact I do not even see the initial revascularization that he had on the right. I have included the arterial history from Dr. Andree Elk last note however below; ASSESSMENT/PLAN: 1. Hx of Critical limb ischemia bilateral lower extremities, PAD: -s/p transmetatarsal amputation of the RLE. -s/p right ATA percutaneous revascularization procedure x2, most recently 11/20/2016 s/p PTA of right AT 100% to less than 20% with a 2.5 x 200 balloon. 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 -03/21/2018 s/p PTA of 90% left popliteal artery to <10% with a 5x20 cutting balloon, PTA of 90% left peroneal to <20% with a 3x20 balloon, PTA of 100% left AT to <20% with a 2.5x220 balloon. 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 Will plan on the LLE soon after. Risks/benefits of procedure have been discussed and patient has elected to proceed. We have been using endoform to the right TMA amputation site wound and Santyl to the left great toe 10/07/2018; the area on the tip of  his left great toe looked better we have been using Santyl here. We continue to have a very difficult probing hole on the right TMA amputation site we have been using endoform. I went on to use his fifth Oasis today 10/14/2018; the tip of the left great toe continues to look better. We have been using Santyl here the surface however is healthy and I think we can change to an alginate. The right TMA has not changed. Once again he has no superficial opening there is callus and thick subcutaneous tissue over the orifice once you remove this there is the probing area that we have been dealing with without too much change. There is no palpable bone I have been placing Oasis here and put Oasis #6 in this today after a more vigorous debridement 10/21/18; the left great toe still has necrotic surface requiring debridement. The right TMA site hasn't changed in view of the thick callus over the wound bed. With removal of this there is still the opening however this does not appear to have the same depth. Again there is no palpable bone. Oasis was replaced 10/28/2018; patient comes in with both wounds looking worse. The area over the first toe tip is now down to bone. The area over the TMA site is deeper and down to bone clearly with a increase in overall wound area. Equally concerning on the right TMA is the complete absence of a pulse this week which is a change. We are not even able to Doppler this. On the right he has a noncompressible ABI greater than 1.4 11/04/2018. Both wounds look somewhat worse. X-rays showed no osteomyelitis of the right foot but on the left there was underlying osteomyelitis in the left great toe distal phalanx. I been on the phone to Dr. Andree Elk surface at [facility] in [place] and they are arranging for another angiogram on the right on Monday. I have him on doxycycline  for the osteomyelitis in the left great toe for now. Infectious disease may be necessary 1/17; 2-week hiatus.  Patient was admitted to hospital at [place]. My understanding is he underwent an angioplasty of the right anterior tibial artery and had stents placed in the left anterior artery and the left tibial peroneal trunk. This was done by Dr. Andree Elk of interventional radiology. There is no major change in either 1 of the wounds. They have been using Aquacel Ag. As far as they are aware no imaging studies were done of the foot which is indeed unfortunate. I had him on doxycycline for 2 weeks since we identified the osteomyelitis in the left great toe by plain x-ray. I have renewed that again today. I am still suspicious about osteomyelitis in the amputation site and would consider doing another MRI to compare with the one done in September. The idea of hyperbaric oxygen certainly comes up for discussion 1/24; no major change in either wound area. I have him on doxycycline for osteomyelitis at the tip of the left great toe. As noted he has been previously and recently revascularized by Dr. Andree Elk at [place]. He is tolerating the doxycycline well. For some reason we do not have an infectious disease consult yet. Culture of drainage from the right foot site last week was negative 1/31; MRI of the right foot did not show osteomyelitis of the right ankle and foot. Notable for a skin ulceration overlying the second metatarsal stump with generalizing soft tissue edema of the ankle and foot consistent with cellulitis. Noted to have a partial-thickness tear of the Achilles tendon 7.5 cm proximal to the insertion Nothing really new in terms of symptoms. Patient's area on the tip of the left great toe is just about closed although he still has the probing area on the metatarsal amputation site. Appointment with Dr. Linus Salmons of infectious disease next week 2/7; Dr. Novella Olive did not feel that any further antibiotics were necessary he would follow-up in 2 months. The left great toe appears to be closed still some surface callus that  I gently looked under high did not see anything open or anything that was threatening to be open. He still has the open area on the mid part of his TMA site using endoform 2/14; left great toe is closed and he is completing his doxycycline as of last Sunday. He is not on any antibiotics. Unfortunately out of the right foot his wife noticed some subdermal hemorrhage this week. He had been walking 2 miles I had given him permission to do so this is not really a plantar wound. Using endoform to this wound but I changed to silver alginate this week 2/21; left great toe remains closed. Culture last week grew Streptococcus angiosis which I am not really familiar with however it is penicillin sensitive and I am going to put him on Augmentin. Not much change in the wound on the right foot the deep area is still probing precariously close to bone and the wound on the margin of the TMA is larger. 2/28; left great toe remains closed. He is completing the Augmentin I gave him last week. 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 This is being shown to Dr. Andree Elk at [facility] to see if there is anything else that can be done here. He has been using silver alginate strips on the right 3/6; left great toe remains closed. He sees Dr. Andree Elk on Monday. The area on the plantar aspect  of the right foot has the same small orifice with thick callused tissue around this. However this time with removal of the callus tissue the wound is open all the way along the incision line to the end medially. We have been using silver alginate. 3/13; left toe remains closed although the area is callused. He sees Dr. Jacqualyn Posey of podiatry next week. The area on the plantar right foot looked a lot better this week. Culture I did of this was negative we use silver alginate. He is going next week for an attempt at revascularization by Dr. Andree Elk 3/23; left toe remains closed although the area is callused. He  will see Dr. Jacqualyn Posey in follow-up. The area on the right plantar foot continues to look surprisingly better over the last 3 visits. We have been using silver alginate. The revascularization he was supposed to have by Dr. Andree Elk at [facility] in [place] has been canceled [facility] procedure] 4/6; the right foot remains closed albeit callused. Podiatry canceled the appointment with regards to the left great toe. He has not seen Dr. Andree Elk at [facility] but thinks that Dr. Andree Elk has "done all he can do". He would be a candidate for the hemostaemix trial Readmission 07/29/2019 Mr. Stann Mainland is a man we know well from at least 3 previous stays in this clinic. He is a type II diabetic with severe PAD followed by Dr. Andree Elk at [facility] in [place]. 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 So far everything in both of these areas has remained closed. About 2 weeks ago he went to see his podiatrist at [facility] Dr. Babs Bertin. He had a thick callus on the left fifth metatarsal head that was shaved. He has developed an open wound in this area. They have been offloading this in his diabetic shoes. The patient has not had any more revascularizations by Dr. Andree Elk since the last time he was here. ABI in our clinic at the posterior tibial on the left was 1.06 10/6; no real change in the area on the plantar met head. Small wound with 2 mm of depth. He has thick skin probably from pressure around the wound. We have been using silver alginate 10/13; small wound in the left fifth plantar met head. Arrives today with undermining laterally and purulent drainage. Our intake nurse cultured this. His wife stated they noticed a change in color over the last day or 2. He is not systemically unwell Objective Constitutional Sitting or standing Blood Pressure is within target range for patient..  Pulse regular and within target range for patient.Marland Kitchen Respirations regular, non-labored and within target range.. Temperature is normal and within the target range for the patient.Marland Kitchen Appears in no distress. Vitals Time Taken: 1:33 PM, Temperature: 98.6 F, Pulse: 75 bpm, Respiratory Rate: 18 breaths/min, Blood Pressure: 117/69 mmHg. Respiratory work of breathing is normal. General Notes: Wound exam; the patient's wound did not look too much different however there was undermining with purulent drainage laterally over the lateral part of the fifth metatarsal head. Using pickups and a #15 scalpel that skin was removed. Purulent drainage was cultured by our intake nurse. There is scant amount of erythema Integumentary (Hair, Skin) Slight amount of erythema around the wound in the lateral fifth met head on the left. Wound #5 status is Open. Original cause of wound was Gradually  Appeared. The wound is located on the Left Metatarsal head fifth. The wound measures 0.2cm length x 0.3cm width x 0.2cm depth; 0.047cm^2 area and 0.009cm^3 volume. There is Fat Layer (Subcutaneous Tissue) Exposed exposed. There is undermining starting at 12:00 and ending at 12:00 with a maximum distance of 1.7cm. There is a small amount of purulent drainage noted. The wound margin is thickened. There is large (67-100%) pink, pale granulation within the wound bed. There is no necrotic tissue within the wound bed. Assessment Active Problems ICD-10 Type 2 diabetes mellitus with foot ulcer Type 2 diabetes mellitus with diabetic peripheral angiopathy without gangrene Non-pressure chronic ulcer of other part of left foot limited to breakdown of skin Cellulitis of left lower limb Procedures Wound #5 Pre-procedure diagnosis of Wound #5 is a Diabetic Wound/Ulcer of the Lower Extremity located on the Left Metatarsal head fifth .Severity of Tissue Pre Debridement is: Fat layer exposed. There was a Selective/Open Wound  Skin/Epidermis Debridement with a total area of 0.06 sq cm performed by Ricard Dillon., MD. With the following instrument(s): Blade, and Forceps to remove Non-Viable tissue/material. Material removed includes Callus, Skin: Dermis, and Skin: Epidermis after achieving pain control using Lidocaine 5% topical ointment. 1 specimen was taken by a Tissue Culture and sent to the lab per facility protocol. A time out was conducted at 14:06, prior to the start of the procedure. A Minimum amount of bleeding was controlled with Pressure. The procedure was tolerated well with a pain level of 0 throughout and a pain level of 0 following the procedure. Post Debridement Measurements: 0.2cm length x 0.3cm width x 0.2cm depth; 0.009cm^3 volume. Character of Wound/Ulcer Post Debridement is improved. Severity of Tissue Post Debridement is: Fat layer exposed. Post procedure Diagnosis Wound #5: Same as Pre-Procedure Plan Follow-up Appointments: Return Appointment in 1 week. Dressing Change Frequency: Change dressing every day. Wound Cleansing: Wound #5 Left Metatarsal head fifth: Clean wound with Wound Cleanser Primary Wound Dressing: Wound #5 Left Metatarsal head fifth: Calcium Alginate with Silver Secondary Dressing: Wound #5 Left Metatarsal head fifth: Kerlix/Rolled Gauze - secure with tape Dry Gauze Other: - felt to cushion shoe Off-Loading: Other: - surgical shoe with felt Laboratory ordered were: Anerobic culture - non healing wound The following medication(s) was prescribed: doxycycline monohydrate oral 100 mg capsule 1 capsule oral bid for 7 days for wound infection left foot starting 08/12/2019 1. A superficial wound certainly larger than last week. 2. Purulent drainage for CandS. Empiric doxycycline 100 twice daily for 7 days 3. Silver alginate change daily 4. Provided infection is under control he will need a total contact cast next week Electronic Signature(s) Signed: 08/12/2019  2:24:52 PM By: [name] MD Entered By: [name] on 08/12/2019 14:24:51 -------------------------------------------------------------------------------- SuperBill Details Patient Name: Date of Service: [name], [name] 08/12/2019 Medical Record [id number] Patient Account Number: [id number] Date of Birth/Sex: Treating RN: Aug 27, 1946 (72 y.o. [name]) [name] Primary Care Provider: [name] Other Clinician: Referring Provider: Treating Provider/Extender:[name], [name], [name] in Treatment: 2 Diagnosis Coding ICD-10 Codes Code Description E11.621 Type 2 diabetes mellitus with foot ulcer E11.51 Type 2 diabetes mellitus with diabetic peripheral angiopathy without gangrene L97.521 Non-pressure chronic ulcer of other part of left foot limited to breakdown of skin L03.116 Cellulitis of left lower limb Facility Procedures CPT4 Code Description: 19622297 97597 - DEBRIDE WOUND 1ST 20 SQ CM OR < ICD-10 Diagnosis Description L97.521 Non-pressure chronic ulcer of other part of left foot limited Modifier: to breakdo Quantity: 1 wn of skin  Physician Procedures CPT4 Code Description: [id number] 93790 - WC PHYS DEBR WO ANESTH 20 SQ CM ICD-10 Diagnosis Description L97.521 Non-pressure chronic ulcer of other part of left foot limited Modifier: to breakdow Quantity: 1 n of skin Electronic Signature(s) Signed: 08/12/2019 5:42:33 PM By: [name] MD Entered By: [name] on 08/12/2019 14:25:09

## 2019-10-07 NOTE — Progress Notes (Signed)
[name], [name] ([id number]) Visit Report for 08/12/2019 Arrival Information Details Patient Name: Date of Service: [name], [name] 08/12/2019 1:15 PM Medical Record [id number] Patient Account Number: [id number] Date of Birth/Sex: Treating RN: Apr 22, 1946 (72 y.o. Judie PetitM) [name], Carrie Primary Care Johnhenry Tippin: Selinda FlavinHoward, Kevin Other Clinician: Referring Emryn Flanery: Treating Doshia Dalia/Extender:[name], Peter CongoMichael Howard, Wadie LessenKevin Weeks in Treatment: 2 Visit Information History Since Last Visit Added or deleted any medications: No Patient Arrived: Ambulatory Any new allergies or adverse reactions: No Arrival Time: 13:33 Had a fall or experienced change in No Accompanied By: wife activities of daily living that may affect Transfer Assistance: None risk of falls: Patient Identification Verified: Yes Signs or symptoms of abuse/neglect since last No Secondary Verification Process Completed: Yes visito Patient Requires Transmission-Based No Hospitalized since last visit: No Precautions: Implantable device outside of the clinic excluding No Patient Has Alerts: No cellular tissue based products placed in the center since last visit: Has Dressing in Place as Prescribed: Yes Pain Present Now: No Electronic Signature(s) Signed: 10/07/2019 3:02:15 PM By: Karl Itoawkins, Destiny Entered By: Karl Itoawkins, Destiny on 08/12/2019 13:33:31 -------------------------------------------------------------------------------- Encounter Discharge Information Details Patient Name: Date of Service: [name], [name] 08/12/2019 1:15 PM Medical Record [id number] Patient Account Number: [id number] Date of Birth/Sex: Treating RN: Apr 22, 1946 [age] y.o. [name]) Lynch, Shatara Primary Care Cristella Stiver: Selinda FlavinHoward, Kevin Other Clinician: Referring Stephaie Dardis: Treating Avant Printy/Extender:[name], Peter CongoMichael Howard, Wadie LessenKevin Weeks in Treatment: 2 Encounter Discharge Information Items Post Procedure Vitals Discharge Condition: Stable Temperature (F):  98.6 Ambulatory Status: Ambulatory Pulse (bpm): 75 Discharge Destination: Home Respiratory Rate (breaths/min): 18 Transportation: Private Auto Blood Pressure (mmHg): 117/69 Accompanied By: alone Schedule Follow-up Appointment: Yes Clinical Summary of Care: Patient Declined Electronic Signature(s) Signed: 08/12/2019 5:52:09 PM By: Zandra AbtsLynch, Shatara RN, BSN Entered By: Zandra AbtsLynch, Shatara on 08/12/2019 17:46:37 -------------------------------------------------------------------------------- Lower Extremity Assessment Details Patient Name: Date of Service: [name], Keymon 08/12/2019 1:15 PM Medical Record [id number] Patient Account Number: [id number] Date of Birth/Sex: Treating RN: Apr 22, 1946 [age] y.o. [name]) Dwiggins, Shannon Primary Care Kazia Grisanti: Selinda FlavinHoward, Kevin Other Clinician: Referring Cicilia Clinger: Treating Manasseh Pittsley/Extender:[name], Peter CongoMichael Howard, Wadie LessenKevin Weeks in Treatment: 2 Edema Assessment Assessed: [Left: No] [Right: No] Edema: [Left: N] [Right: o] Calf Left: Right: Point of Measurement: cm From Medial Instep 38.5 cm cm Ankle Left: Right: Point of Measurement: cm From Medial Instep 27 cm cm Vascular Assessment Pulses: Dorsalis Pedis Palpable: [Left:Yes] Electronic Signature(s) Signed: 08/13/2019 5:37:20 PM By: Cherylin Mylarwiggins, Shannon Entered By: Cherylin Mylarwiggins, Shannon on 08/12/2019 13:41:37 -------------------------------------------------------------------------------- Multi Wound Chart Details Patient Name: Date of Service: [name], [name] 08/12/2019 1:15 PM Medical Record [id number] Patient Account Number: [id number] Date of Birth/Sex: Treating RN: Apr 22, 1946 (72 y.o. Judie PetitM) [name], Carrie Primary Care Gay Rape: Selinda FlavinHoward, Kevin Other Clinician: Referring Demetri Kerman: Treating Marinell Igarashi/Extender:[name], Peter CongoMichael Howard, Wadie LessenKevin Weeks in Treatment: 2 Vital Signs Height(in): Pulse(bpm): 75 Weight(lbs): Blood Pressure(mmHg): 117/69 Body Mass Index(BMI): Temperature(F):  98.6 Respiratory 18 Rate(breaths/min): Photos: [5:No Photos] [N/A:N/A] Wound Location: [5:Left Metatarsal head fifth N/A] Wounding Event: [5:Gradually Appeared] [N/A:N/A] Primary Etiology: [5:Diabetic Wound/Ulcer of the N/A Lower Extremity] Comorbid History: [5:Cataracts, Chronic sinus N/A problems/congestion, Coronary Artery Disease, Hypertension, Peripheral Arterial Disease, Type II Diabetes, Osteoarthritis, Neuropathy] Date Acquired: [5:07/14/2019] [N/A:N/A] Weeks of Treatment: [5:2] [N/A:N/A] Wound Status: [5:Open] [N/A:N/A] Measurements L x W x D 0.2x0.3x0.2 [N/A:N/A] (cm) Area (cm) : [5:0.047] [N/A:N/A] Volume (cm) : [5:0.009] [N/A:N/A] % Reduction in Area: [5:25.40%] [N/A:N/A] % Reduction in Volume: 30.80% [N/A:N/A] Starting Position 1 12 (o'clock): Ending Position 1 [5:12] (o'clock): Maximum Distance 1 [5:1.7] (cm): Undermining: [5:Yes] [N/A:N/A] Classification: [5:Grade 1] [N/A:N/A] Exudate Amount: [5:Small] [N/A:N/A] Exudate Type: [5:Purulent] [N/A:N/A] Exudate Color: [  5:yellow, brown, green] [N/A:N/A] Wound Margin: [5:Thickened] [N/A:N/A] Granulation Amount: [5:Large (67-100%)] [N/A:N/A] Granulation Quality: [5:Pink, Pale] [N/A:N/A] Necrotic Amount: [5:None Present (0%)] [N/A:N/A] Exposed Structures: [5:Fat Layer (Subcutaneous N/A Tissue) Exposed: Yes Fascia: No Tendon: No Muscle: No Joint: No Bone: No] Epithelialization: [5:Small (1-33%)] [N/A:N/A] Debridement: [5:Debridement - Selective/Open Wound] [N/A:N/A] Pre-procedure [5:14:06] [N/A:N/A] Verification/Time Out Taken: Pain Control: [5:Lidocaine 5% topical ointment] [N/A:N/A] Tissue Debrided: [5:Callus] [N/A:N/A] Level: [5:Skin/Epidermis] [N/A:N/A] Debridement Area (sq cm):0.06 [N/A:N/A] Instrument: [5:Blade, Forceps] [N/A:N/A] Bleeding: [5:Minimum] [N/A:N/A] Hemostasis Achieved: [5:Pressure] [N/A:N/A] Procedural Pain: [5:0] [N/A:N/A] Post Procedural Pain: [5:0] [N/A:N/A] Debridement Treatment Procedure  was tolerated [N/A:N/A] Response: [5:well] Post Debridement [5:0.2x0.3x0.2] [N/A:N/A] Measurements L x W x D (cm) Post Debridement [5:0.009] [N/A:N/A] Volume: (cm) Procedures Performed: Debridement [N/A:N/A] Treatment Notes Electronic Signature(s) Signed: 08/12/2019 5:42:33 PM By: Baltazar Najjar MD Signed: 10/07/2019 3:01:15 PM By: Todd Pax RN Entered By: Baltazar Najjar on 08/12/2019 14:16:47 -------------------------------------------------------------------------------- Multi-Disciplinary Care Plan Details Patient Name: Date of Service: MELINDA, POTTINGER 08/12/2019 1:15 PM Medical Record [id number] Patient Account Number: [id number] Date of Birth/Sex: Treating RN: 1946-01-17 (72 y.o. [name] Primary Care Adis Sturgill: [name] Other Clinician: Referring Ryanne Morand: Treating Zyniah Ferraiolo/Extender:[name], [name], [name] in Treatment: 2 Active Inactive Wound/Skin Impairment Nursing Diagnoses: Knowledge deficit related to ulceration/compromised skin integrity Goals: Patient/caregiver will verbalize understanding of skin care regimen Date Initiated: 07/29/2019 Target Resolution Date: 08/22/2019 Goal Status: Active Ulcer/skin breakdown will have a volume reduction of 30% by week 4 Date Initiated: 07/29/2019 Target Resolution Date: 08/22/2019 Goal Status: Active Interventions: Assess patient/caregiver ability to obtain necessary supplies Assess patient/caregiver ability to perform ulcer/skin care regimen upon admission and as needed Assess ulceration(s) every visit Notes: Electronic Signature(s) Signed: 10/07/2019 3:01:15 PM By: Todd Pax RN Entered By: Todd Pax on 08/12/2019 13:27:28 -------------------------------------------------------------------------------- Pain Assessment Details Patient Name: Date of Service: JERRYL, HOLZHAUER 08/12/2019 1:15 PM Medical Record [id number] Patient Account Number: [id number] Date of  Birth/Sex: Treating RN: 1946/01/12 (72 y.o. [name] Primary Care Kamran Coker: [name] Other Clinician: Referring Janei Scheff: Treating Amarius Toto/Extender:[name], [name], [name] in Treatment: 2 Active Problems Location of Pain Severity and Description of Pain Patient Has Paino No Site Locations Pain Management and Medication Current Pain Management: Electronic Signature(s) Signed: 10/07/2019 3:01:15 PM By: Todd Pax RN Signed: 10/07/2019 3:02:15 PM By: Karl Ito Entered By: Karl Ito on 08/12/2019 13:35:08 -------------------------------------------------------------------------------- Patient/Caregiver Education Details Patient Name: Date of Service: Todd Loots 10/13/2020andnbsp1:15 PM Medical Record Patient Account Number: [id number] Number: Treating RN: Todd Pax Date of Birth/Gender: 06/05/1946 (72 y.o. Other Clinician: M) Treating Baltazar Najjar Primary Care Physician: [name] Physician/Extender: Referring Physician: Joaquin Bend in Treatment: 2 Education Assessment Education Provided To: Patient Education Topics Provided Wound/Skin Impairment: Methods: Explain/Verbal Responses: State content correctly Electronic Signature(s) Signed: 10/07/2019 3:01:15 PM By: Todd Pax RN Entered By: Todd Pax on 08/12/2019 13:28:09 -------------------------------------------------------------------------------- Wound Assessment Details Patient Name: Date of Service: AARNAV, STEAGALL 08/12/2019 1:15 PM Medical Record [id number] Patient Account Number: [id number] Date of Birth/Sex: Treating RN: 10-Apr-1946 (73 y.o. Todd Right Primary Care Athalee Esterline: [name] Other Clinician: Referring Jaskaran Dauzat: Treating Arren Laminack/Extender:[name], [name], [name] in Treatment: 2 Wound Status Wound Number: 5 Primary Diabetic Wound/Ulcer of the Lower Extremity Etiology: Wound Location: Left  Metatarsal head fifth Wound Open Wounding Event: Gradually Appeared Status: Date Acquired: 07/14/2019 Comorbid Cataracts, Chronic sinus problems/congestion, Weeks Of Treatment: 2 History: Coronary Artery Disease, Hypertension, Clustered Wound: No Peripheral Arterial Disease, Type II Diabetes, Osteoarthritis, Neuropathy Photos Wound Measurements Length: (cm) 0.2 % Reduction Width: (  cm) 0.3 % Reduction Depth: (cm) 0.2 Epithelializ Area: (cm) 0.047 Undermining Volume: (cm) 0.009 Starting Ending Po Maximum D in Area: 25.4% in Volume: 30.8% ation: Small (1-33%) : Yes Position (o'clock): 12 sition (o'clock): 12 istance: (cm) 1.7 Wound Description Classification: Grade 1 Foul Odor Af Wound Margin: Thickened Slough/Fibri Exudate Amount: Small Exudate Type: Purulent Exudate Color: yellow, brown, green Wound Bed Granulation Amount: Large (67-100%) Granulation Quality: Pink, Pale Fascia Expos Necrotic Amount: None Present (0%) Fat Layer (S Tendon Expos Muscle Expos Joint Expose Bone Exposed ter Cleansing: No no No Exposed Structure ed: No ubcutaneous Tissue) Exposed: Yes ed: No ed: No d: No : No Electronic Signature(s) Signed: 08/13/2019 5:37:20 PM By: Kela Millin Signed: 09/03/2019 4:01:42 PM By: Mikeal Hawthorne EMT/HBOT Entered By: Mikeal Hawthorne on 08/13/2019 08:34:05 -------------------------------------------------------------------------------- Vitals Details Patient Name: Date of Service: KEITHON, MCCOIN 08/12/2019 1:15 PM Medical Record [id number] Patient Account Number: [id number] Date of Birth/Sex: Treating RN: [birth year] (72 y.o. [name] Primary Care Tracye Szuch: [name] Other Clinician: Referring Nasario Czerniak: Treating Daisee Centner/Extender:[name], [name], [name] in Treatment: 2 Vital Signs Time Taken: 13:33 Temperature (F): 98.6 Pulse (bpm): 75 Respiratory Rate (breaths/min): 18 Blood Pressure (mmHg):  117/69 Reference Range: 80 - 120 mg / dl Electronic Signature(s) Signed: 10/07/2019 3:02:15 PM By: Sandre Kitty Entered By: Sandre Kitty on 08/12/2019 13:35:00

## 2019-10-07 NOTE — Progress Notes (Signed)
MART, COLPITTS ([id number]) Visit Report for 09/04/2019 Arrival Information Details Patient Name: Date of Service: [name], [name] 09/04/2019 9:45 AM Medical Record [id number] Patient Account Number: [id number] Date of Birth/Sex: Treating RN: 09-13-1946 (73 y.o. [name], [name] Primary Care Fabiola Mudgett: [name] Other Clinician: Referring Kerie Badger: Treating Raed Schalk/Extender:[name], [name], [name] in Treatment: 5 Visit Information History Since Last Visit Added or deleted any medications: No Patient Arrived: Ambulatory Any new allergies or adverse reactions: No Arrival Time: 10:29 Had a fall or experienced change in No Accompanied By: wife activities of daily living that may affect Transfer Assistance: None risk of falls: Patient Identification Verified: Yes Signs or symptoms of abuse/neglect since last No Secondary Verification Process Completed: Yes visito Patient Requires Transmission-Based No Hospitalized since last visit: No Precautions: Implantable device outside of the clinic excluding No Patient Has Alerts: No cellular tissue based products placed in the center since last visit: Has Dressing in Place as Prescribed: Yes Pain Present Now: No Electronic Signature(s) Signed: 10/07/2019 3:00:22 PM By: Karl Ito Entered By: Karl Ito on 09/04/2019 10:29:49 -------------------------------------------------------------------------------- Encounter Discharge Information Details Patient Name: Date of Service: [name], [name] 09/04/2019 9:45 AM Medical Record [id number] Patient Account Number: [id number] Date of Birth/Sex: Treating RN: [birth year] (73 y.o. [name] Primary Care Olyn Landstrom: [name] Other Clinician: Referring Ormond Lazo: Treating Jace Dowe/Extender:[name], [name], [name] in Treatment: 5 Encounter Discharge Information Items Post Procedure Vitals Discharge Condition: Stable Temperature (F):  98.4 Ambulatory Status: Ambulatory Pulse (bpm): 78 Discharge Destination: Home Respiratory Rate (breaths/min): 18 Transportation: Private Auto Blood Pressure (mmHg): 104/64 Accompanied By: spouse Schedule Follow-up Appointment: Yes Clinical Summary of Care: Patient Declined Electronic Signature(s) Signed: 09/04/2019 6:27:24 PM By: [name] RN, BSN Entered By: [name] on 09/04/2019 11:56:09 -------------------------------------------------------------------------------- Lower Extremity Assessment Details Patient Name: Date of Service: [name], [name] 09/04/2019 9:45 AM Medical Record [id number] Patient Account Number: [id number] Date of Birth/Sex: Treating RN: [birth year] (73 y.o. [name] Primary Care Breiana Stratmann: [name] Other Clinician: Referring Noreta Kue: Treating Allizon Woznick/Extender:[name], [name], [name] in Treatment: 5 Edema Assessment Assessed: [Left: No] [Right: No] Edema: [Left: N] [Right: o] Calf Left: Right: Point of Measurement: cm From Medial Instep 39.4 cm cm Ankle Left: Right: Point of Measurement: cm From Medial Instep 26.8 cm cm Vascular Assessment Pulses: Dorsalis Pedis Palpable: [Left:Yes] Electronic Signature(s) Signed: 09/08/2019 5:46:53 PM By: Zandra Abts RN, BSN Entered By: Zandra Abts on 09/04/2019 10:49:23 -------------------------------------------------------------------------------- Multi Wound Chart Details Patient Name: Date of Service: [name], [name] 09/04/2019 9:45 AM Medical Record [id number] Patient Account Number: [id number] Date of Birth/Sex: Treating RN: 1946/05/26 (73 y.o. 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] Comorbid History: [5:Cataracts, Chronic sinus N/A problems/congestion, Coronary Artery Disease, Hypertension, Peripheral Arterial Disease, Type II Diabetes, Osteoarthritis, Neuropathy] Date Acquired: [5:07/14/2019] [N/A:N/A] Weeks of Treatment: [5:5] [N/A:N/A] Wound Status: [5:Open] [N/A:N/A] Measurements L x W x D 0.5x0.8x0.1 [N/A:N/A] (cm) Area (cm) : [5:0.314] [N/A:N/A] Volume (cm) : [5:0.031] [N/A:N/A] % Reduction in Area: [5:-398.40%] [N/A:N/A] % Reduction in Volume: -138.50% [N/A:N/A] Classification: [5:Grade 1] [N/A:N/A] Exudate Amount: [5:Small] [N/A:N/A] Exudate Type: [5:Serosanguineous] [N/A:N/A] Exudate Color: [5:red, brown] [N/A:N/A] Wound Margin: [5:Thickened] [N/A:N/A] Granulation Amount: [  5:Large (67-100%)] [N/A:N/A] Granulation Quality: [5:Pink] [N/A:N/A] Necrotic Amount: [5:Small (1-33%)] [N/A:N/A] Exposed Structures: [5:Fat Layer (Subcutaneous N/A Tissue) Exposed: Yes Fascia: No Tendon: No Muscle: No Joint: No Bone: No] Epithelialization: [5:Small (1-33%)] [N/A:N/A] Debridement: [5:Debridement - Excisional N/A] Pre-procedure [5:10:57] [N/A:N/A] Verification/Time Out Taken: Pain Control: [5:Other] [N/A:N/A] Tissue Debrided: [5:Callus, Subcutaneous, Slough] [N/A:N/A] Level: [5:Skin/Subcutaneous Tissue N/A] Debridement Area (sq cm):0.25 [N/A:N/A] Instrument: [5:Curette] [N/A:N/A] Bleeding: [5:Minimum] [N/A:N/A] Hemostasis Achieved: [5:Pressure] [N/A:N/A] Procedural Pain: [5:0] [N/A:N/A] Post Procedural Pain: [5:0] [N/A:N/A] Debridement Treatment [5:Procedure was tolerated] [N/A:N/A] Response: [5:well] Post Debridement [5:0.5x0.8x0.1] [N/A:N/A] Measurements L x W x D (cm) Post  Debridement [5:0.031] [N/A:N/A] Volume: (cm) Procedures Performed: [5:Debridement Total Contact Cast] [N/A:N/A] Treatment Notes Electronic Signature(s) Signed: 09/04/2019 5:30:28 PM By: Deon Pilling Signed: 09/04/2019 5:45:12 PM By: [name] MD Entered By: [name] on 09/04/2019 11:23:00 -------------------------------------------------------------------------------- Multi-Disciplinary Care Plan Details Patient Name: Date of Service: [name], [name] 09/04/2019 9:45 AM Medical Record [id number] Patient Account Number: [id number] Date of Birth/Sex: Treating RN: [birth year] (73 y.o. [name] Primary Care Dayzha Pogosyan: [name] Other Clinician: Referring Bindi Klomp: Treating Lynasia Meloche/Extender:[name], [name], [name] in Treatment: 5 Active Inactive Wound/Skin Impairment Nursing Diagnoses: Knowledge deficit related to ulceration/compromised skin integrity Goals: Patient/caregiver will verbalize understanding of skin care regimen Date Initiated: 07/29/2019 Target Resolution Date: 09/26/2019 Goal Status: Active Ulcer/skin breakdown will have a volume reduction of 30% by week 4 Date Initiated: 07/29/2019 Target Resolution Date: 09/26/2019 Goal Status: Active Interventions: Assess patient/caregiver ability to obtain necessary supplies Assess patient/caregiver ability to perform ulcer/skin care regimen upon admission and as needed Assess ulceration(s) every visit Notes: Electronic Signature(s) Signed: 09/04/2019 5:16:49 PM By: Kela Millin Entered By: Kela Millin on 09/04/2019 11:03:03 -------------------------------------------------------------------------------- Pain Assessment Details Patient Name: Date of Service: [name], [name] 09/04/2019 9:45 AM Medical Record [id number] Patient Account Number: [id number] Date of Birth/Sex: Treating RN: [birth year] (73 y.o. [name] Primary Care Venetta Knee: [name] Other  Clinician: Referring Jaben Benegas: Treating Khadeem Rockett/Extender:[name], [name], [name] in Treatment: 5 Active Problems Location of Pain Severity and Description of Pain Patient Has Paino No Site Locations Pain Management and Medication Current Pain Management: Electronic Signature(s) Signed: 09/04/2019 5:30:28 PM By: Deon Pilling Signed: 10/07/2019 3:00:22 PM By: Sandre Kitty Entered By: Sandre Kitty on 09/04/2019 10:31:37 -------------------------------------------------------------------------------- Patient/Caregiver Education Details Patient Name: Date of Service: [name], [name] 11/5/2020andnbsp9:45 AM Medical Record [id number] Patient Account Number: [id number] Date of Birth/Gender: 1946/01/28 (73 y.o. M) Treating RN: Kela Millin Primary Care Physician: [name] Other Clinician: Referring Physician: Treating Physician/Extender:[name], [name], [name] in Treatment: 5 Education Assessment Education Provided To: Patient Education Topics Provided Offloading: Handouts: What is Offloadingo Methods: Explain/Verbal Responses: State content correctly Wound/Skin Impairment: Methods: Explain/Verbal Responses: State content correctly Electronic Signature(s) Signed: 09/04/2019 5:16:49 PM By: Kela Millin Entered By: Kela Millin on 09/04/2019 11:03:33 -------------------------------------------------------------------------------- Wound Assessment Details Patient Name: Date of Service: [name], [name] 09/04/2019 9:45 AM Medical Record [id number] Patient Account Number: [id number] Date of Birth/Sex: Treating RN: July 27, 1946 (73 y.o. Lorette Ang, Meta.Reding Primary Care Daril Warga: [name] Other Clinician: Referring Ashanty Coltrane: Treating Hassel Uphoff/Extender:[name], [name], [name] in Treatment: 5 Wound Status Wound Number: 5 Primary Diabetic Wound/Ulcer of the Lower Extremity Etiology: Wound Location: Left  Metatarsal head fifth Wound Open Wounding Event: Gradually Appeared Status: Date Acquired: 07/14/2019 Comorbid Cataracts, Chronic sinus problems/congestion, Weeks Of Treatment: 5 History: Coronary Artery Disease, Hypertension, Clustered Wound: No Peripheral Arterial Disease, Type II Diabetes, Osteoarthritis, Neuropathy Photos Wound Measurements Length: (cm) 0.5 Width: (cm) 0.8 Depth: (cm) 0.1 Area: (cm) 0.314  Volume: (cm) 0.031 Wound Description Classification: Grade 1 Wound Margin: Thickened Exudate Amount: Small Exudate Type: Serosanguineous Exudate Color: red, brown Wound Bed Granulation Amount: Large (67-100%) Granulation Quality: Pink Necrotic Amount: Small (1-33%) Necrotic Quality: Adherent Slough After Cleansing: No brino No Exposed Structure osed: No (Subcutaneous Tissue) Exposed: Yes osed: No osed: No sed: No ed: No % Reduction in Area: -398.4% % Reduction in Volume: -138.5% Epithelialization: Small (1-33%) Tunneling: No Undermining: No Foul Odor Slough/Fi Fascia Exp Fat Layer Tendon Exp Muscle Exp Joint Expo Bone Expos Electronic Signature(s) Signed: 09/11/2019 3:57:32 PM By: Benjaman KindlerJones, Dedrick EMT/HBOT Signed: 09/11/2019 5:17:30 PM By: Shawn Stalleaton, Bobbi Previous Signature: 09/04/2019 5:30:28 PM Version By: Shawn Stalleaton, Bobbi Previous Signature: 09/08/2019 5:46:53 PM Version By: Zandra AbtsLynch, Shatara RN, BSN Entered By: Benjaman KindlerJones, Dedrick on 09/11/2019 09:42:07 -------------------------------------------------------------------------------- Vitals Details Patient Name: Date of Service: [name], [name] 09/04/2019 9:45 AM Medical Record [id number] Patient Account Number: [id number] Date of Birth/Sex: Treating RN: 1946-04-06 55(73 y.o. Todd FlorM) Deaton, Millard.LoaBobbi Primary Care Detavious Rinn: Selinda FlavinHoward, Kevin Other Clinician: Referring Meena Barrantes: Treating Denene Alamillo/Extender:[name], Peter CongoMichael Howard, Wadie LessenKevin Weeks in Treatment: 5 Vital Signs Time Taken: 10:29 Temperature (F):  98.4 Height (in): 69 Pulse (bpm): 78 Weight (lbs): 210 Respiratory Rate (breaths/min): 18 Body Mass Index (BMI): 31 Blood Pressure (mmHg): 104/64 Capillary Blood Glucose (mg/dl): 90 Reference Range: 80 - 120 mg / dl Electronic Signature(s) Signed: 10/07/2019 3:00:22 PM By: Karl Itoawkins, Destiny Entered By: Karl Itoawkins, Destiny on 09/04/2019 10:31:32

## 2019-10-07 NOTE — Progress Notes (Signed)
GREGARY, BLACKARD ([id number]) Visit Report for 09/11/2019 Arrival Information Details Patient Name: Date of Service: MAVERIC, DEBONO 09/11/2019 11:15 AM Medical Record [id number] Patient Account Number: [id number] Date of Birth/Sex: Treating RN: 1946-08-11 (73 y.o. Lorette Ang, Meta.Reding Primary Care Jelesa Mangini: [name] Other Clinician: Referring Iren Whipp: Treating Kamau Weatherall/Extender:[name], [name], [name] in Treatment: 6 Visit Information History Since Last Visit Added or deleted any medications: No Patient Arrived: Ambulatory Any new allergies or adverse reactions: No Arrival Time: 11:20 Had a fall or experienced change in No Accompanied By: wife activities of daily living that may affect Transfer Assistance: None risk of falls: Patient Identification Verified: Yes Signs or symptoms of abuse/neglect since last No Secondary Verification Process Completed: Yes visito Patient Requires Transmission-Based No Hospitalized since last visit: No Precautions: Implantable device outside of the clinic excluding No Patient Has Alerts: No cellular tissue based products placed in the center since last visit: Has Dressing in Place as Prescribed: Yes Pain Present Now: No Electronic Signature(s) Signed: 10/07/2019 3:00:22 PM By: Sandre Kitty Entered By: Sandre Kitty on 09/11/2019 11:21:32 -------------------------------------------------------------------------------- Encounter Discharge Information Details Patient Name: Date of Service: OLUWATOBILOBA, MARTIN 09/11/2019 11:15 AM Medical Record [id number] Patient Account Number: [id number] Date of Birth/Sex: Treating RN: December 01, 1945 (73 y.o. [name] Primary Care Croix Presley: [name] Other Clinician: Referring Elyas Villamor: Treating Tipton Ballow/Extender:[name], [name], [name] in Treatment: 6 Encounter Discharge Information Items Post Procedure Vitals Discharge Condition: Stable Temperature (F):  98.4 Ambulatory Status: Ambulatory Pulse (bpm): 85 Discharge Destination: Home Respiratory Rate (breaths/min): 18 Transportation: Private Auto Blood Pressure (mmHg): 131/75 Accompanied By: wife Schedule Follow-up Appointment: Yes Clinical Summary of Care: Electronic Signature(s) Signed: 09/11/2019 5:17:30 PM By: Deon Pilling Entered By: Deon Pilling on 09/11/2019 16:14:58 -------------------------------------------------------------------------------- Lower Extremity Assessment Details Patient Name: Date of Service: LEEUM, SANKEY 09/11/2019 11:15 AM Medical Record [id number] Patient Account Number: [id number] Date of Birth/Sex: Treating RN: [birth year] (73 y.o. [name] Primary Care Pascuala Klutts: [name] Other Clinician: Referring Javionna Leder: Treating Maisen Schmit/Extender:[name], [name], [name] in Treatment: 6 Edema Assessment Assessed: [Left: No] [Right: No] Edema: [Left: N] [Right: o] Calf Left: Right: Point of Measurement: cm From Medial Instep 39.4 cm cm Ankle Left: Right: Point of Measurement: cm From Medial Instep 26.8 cm cm Vascular Assessment Pulses: Dorsalis Pedis Palpable: [Left:Yes] Electronic Signature(s) Signed: 09/15/2019 5:59:11 PM By: Levan Hurst RN, BSN Entered By: Levan Hurst on 09/11/2019 11:45:04 -------------------------------------------------------------------------------- Multi Wound Chart Details Patient Name: Date of Service: BROOK, MALL 09/11/2019 11:15 AM Medical Record [id number] Patient Account Number: [id number] Date of Birth/Sex: Treating RN: 1946/09/05 (73 y.o. 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] Comorbid History: [5:Cataracts, Chronic sinus N/A problems/congestion, Coronary Artery Disease, Hypertension, Peripheral Arterial Disease, Type II Diabetes, Osteoarthritis, Neuropathy] Date Acquired: [5:07/14/2019] [N/A:N/A] Weeks of Treatment: [5:6] [N/A:N/A] Wound Status: [5:Open] [N/A:N/A] Measurements L x W x D 0.4x0.8x0.1 [N/A:N/A] (cm) Area (cm) : [5:0.251] [N/A:N/A] Volume (cm) : [5:0.025] [N/A:N/A] % Reduction in Area: [5:-298.40%] [N/A:N/A] % Reduction in Volume: -92.30% [N/A:N/A] Classification: [5:Grade 1] [N/A:N/A] Exudate Amount: [5:Small] [N/A:N/A] Exudate Type: [5:Serosanguineous] [N/A:N/A] Exudate Color: [5:red, brown] [N/A:N/A] Wound Margin: [5:Thickened] [N/A:N/A] Granulation Amount: [5:Large (67-100%)] [N/A:N/A] Granulation  Quality: [5:Pink] [N/A:N/A] Necrotic Amount: [5:Small (1-33%)] [N/A:N/A] Exposed Structures: [5:Fat Layer (Subcutaneous N/A Tissue) Exposed: Yes Fascia: No Tendon: No Muscle: No Joint: No Bone: No] Epithelialization: [5:Medium (34-66%)] [N/A:N/A] Debridement: [5:Debridement - Excisional N/A] Pre-procedure [5:12:10] [N/A:N/A] Verification/Time Out Taken: Pain Control: [5:Lidocaine 4% Topical Solution] [N/A:N/A] Tissue Debrided: [5:Callus, Subcutaneous] [N/A:N/A] Level: [5:Skin/Subcutaneous Tissue N/A] Debridement Area (sq cm):0.32 [N/A:N/A] Instrument: [5:Curette] [N/A:N/A] Bleeding: [5:Minimum] [N/A:N/A] Hemostasis Achieved: [5:Pressure] [N/A:N/A] Procedural Pain: [5:0] [N/A:N/A] Post Procedural Pain: [5:0] [N/A:N/A] Debridement Treatment [5:Procedure was tolerated] [N/A:N/A] Response: [5:well] Post Debridement [5:0.5x1x0.2] [N/A:N/A] Measurements L x W x D (cm) Post  Debridement [5:0.079] [N/A:N/A] Volume: (cm) Procedures Performed: [5:Debridement Total Contact Cast] [N/A:N/A] Treatment Notes Electronic Signature(s) Signed: 09/11/2019 5:17:30 PM By: Shawn Stalleaton, Bobbi Signed: 09/11/2019 5:47:05 PM By: Baltazar Najjarobson, Michael MD Entered By: Baltazar Najjarobson, Michael on 09/11/2019 12:47:09 -------------------------------------------------------------------------------- Multi-Disciplinary Care Plan Details Patient Name: Date of Service: [name], Jaquel 09/11/2019 11:15 AM Medical Record [id number] Patient Account Number: [id number] Date of Birth/Sex: Treating RN: Feb 27, 1946 [age] y.o. Tammy SoursM) Deaton, Bobbi Primary Care Gelena Klosinski: Selinda FlavinHoward, Kevin Other Clinician: Referring Catalaya Garr: Treating Gracyn Santillanes/Extender:[name], Peter CongoMichael Howard, Wadie LessenKevin Weeks in Treatment: 6 Active Inactive Wound/Skin Impairment Nursing Diagnoses: Knowledge deficit related to ulceration/compromised skin integrity Goals: Patient/caregiver will verbalize understanding of skin care regimen Date Initiated: 07/29/2019 Target Resolution Date: 10/02/2019 Goal Status: Active Ulcer/skin breakdown will have a volume reduction of 30% by week 4 Date Initiated: 07/29/2019 Target Resolution Date: 10/10/2019 Goal Status: Active Interventions: Assess patient/caregiver ability to obtain necessary supplies Assess patient/caregiver ability to perform ulcer/skin care regimen upon admission and as needed Assess ulceration(s) every visit Notes: Electronic Signature(s) Signed: 09/11/2019 5:17:30 PM By: Shawn Stalleaton, Bobbi Entered By: Shawn Stalleaton, Bobbi on 09/11/2019 11:10:21 -------------------------------------------------------------------------------- Pain Assessment Details Patient Name: Date of Service: [name], Amritpal 09/11/2019 11:15 AM Medical Record [id number] Patient Account Number: [id number] Date of Birth/Sex: Treating RN: Feb 27, 1946 [age] y.o. Tammy SoursM) Deaton, Bobbi Primary Care Mayleigh Tetrault: Selinda FlavinHoward, Kevin Other  Clinician: Referring Janay Canan: Treating Juris Gosnell/Extender:[name], Peter CongoMichael Howard, Wadie LessenKevin Weeks in Treatment: 6 Active Problems Location of Pain Severity and Description of Pain Patient Has Paino No Site Locations Pain Management and Medication Current Pain Management: Electronic Signature(s) Signed: 09/11/2019 5:17:30 PM By: Shawn Stalleaton, Bobbi Signed: 10/07/2019 3:00:22 PM By: Karl Itoawkins, Destiny Entered By: Karl Itoawkins, Destiny on 09/11/2019 11:23:49 -------------------------------------------------------------------------------- Patient/Caregiver Education Details Patient Name: Jeraldine LootsODGERS, Katrina 11/12/2020andnbsp11:15 Patient Name: Jeraldine LootsODGERS, Tarell 11/12/2020andnbsp11:15 Date of Service: AM Medical Record [id number] Number: Patient Account Number: [id number] Treating RN: Feb 27, 1946 (73 y.o. Shawn Stalleaton, Bobbi Date of Birth/Gender: M) Other Clinician: Primary Care Physician:Howard, Rob HickmanKevin Treating [name], Michael Referring Physician: Physician/Extender: Joaquin BendHoward, Kevin Weeks in Treatment: 6 Education Assessment Education Provided To: Patient Education Topics Provided Wound Debridement: Handouts: Wound Debridement Methods: Explain/Verbal Responses: Reinforcements needed Electronic Signature(s) Signed: 09/11/2019 5:17:30 PM By: Shawn Stalleaton, Bobbi Entered By: Shawn Stalleaton, Bobbi on 09/11/2019 11:10:41 -------------------------------------------------------------------------------- Wound Assessment Details Patient Name: Date of Service: [name], Cheron 09/11/2019 11:15 AM Medical Record [id number] Patient Account Number: [id number] Date of Birth/Sex: Treating RN: Feb 27, 1946 (73 y.o. Harlon FlorM) Deaton, Millard.LoaBobbi Primary Care Oneda Duffett: Selinda FlavinHoward, Kevin Other Clinician: Referring Eulas Schweitzer: Treating Renne Cornick/Extender:[name], Peter CongoMichael Howard, Wadie LessenKevin Weeks in Treatment: 6 Wound Status Wound Number: 5 Primary Diabetic Wound/Ulcer of the Lower Extremity Etiology: Wound Location: Left Metatarsal head fifth Wound  Open Wounding Event: Gradually Appeared Status: Date Acquired: 07/14/2019 Comorbid Cataracts, Chronic sinus problems/congestion, Weeks Of Treatment: 6 History: Coronary Artery Disease, Hypertension, Clustered Wound: No Peripheral Arterial Disease, Type II Diabetes, Osteoarthritis, Neuropathy Photos Wound Measurements Length: (cm) 0.4 % Reducti Width: (cm) 0.8 % Reducti Depth: (cm) 0.1 Epithelia Area: (cm) 0.251  Tunnelin Volume: (cm) 0.025 Undermin Wound Description Classification: Grade 1 Wound Margin: Thickened Exudate Amount: Small Exudate Type: Serosanguineous Exudate Color: red, brown Wound Bed Granulation Amount: Large (67-100%) Granulation Quality: Pink Necrotic Amount: Small (1-33%) Necrotic Quality: Adherent Slough Foul Odor After Cleansing: No Slough/Fibrino No Exposed Structure Fascia Exposed: No Fat Layer (Subcutaneous Tissue) Exposed: Yes Tendon Exposed: No Muscle Exposed: No Joint Exposed: No Bone Exposed: No on in Area: -298.4% on in Volume: -92.3% lization: Medium (34-66%) g: No ing: No Electronic Signature(s) Signed: 09/12/2019 4:02:07 PM By: Benjaman Kindler EMT/HBOT Signed: 09/12/2019 5:02:36 PM By: Shawn Stall Previous Signature: 09/11/2019 5:17:30 PM Version By: Shawn Stall Entered By: Benjaman Kindler on 09/12/2019 11:01:16 -------------------------------------------------------------------------------- Vitals Details Patient Name: Date of Service: BLAZE, NYLUND 09/11/2019 11:15 AM Medical Record [id number] Patient Account Number: [id number] Date of Birth/Sex: Treating RN: [birth year] (74 y.o. [name], Millard.Loa Primary Care Daesha Insco: [name] Other Clinician: Referring Carena Stream: Treating Aditya Nastasi/Extender:[name], [name], [name] in Treatment: 6 Vital Signs Time Taken: 11:21 Temperature (F): 98.4 Height (in): 69 Pulse (bpm): 85 Weight (lbs): 210 Respiratory Rate (breaths/min): 18 Body Mass Index (BMI):  31 Blood Pressure (mmHg): 131/75 Capillary Blood Glucose (mg/dl): 578 Reference Range: 80 - 120 mg / dl Electronic Signature(s) Signed: 10/07/2019 3:00:22 PM By: Karl Ito Entered By: Karl Ito on 09/11/2019 11:23:42

## 2019-10-07 NOTE — Progress Notes (Signed)
[name], [name] ([id number]) Visit Report for 07/29/2019 Chief Complaint Document Details Patient Name: Date of Service: [name], [name] 07/29/2019 1:15 PM Medical Record [id number] Patient Account Number: [id number] Date of Birth/Sex: Treating RN: 1946/09/07 (72 y.o. [name]) [name] Primary Care Provider: [name] Other Clinician: Referring Provider: Treating Provider/Extender:[name], [name], [name] in Treatment: 0 Information Obtained from: Patient Chief Complaint Patient here for review of a nonhealing wound at a right transmetatarsal amputation site 07/09/17; patient is here out of concerns for 2 spots on his right transmetatarsal site 06/25/18. Patient is here for an area on his right transmetatarsal amputation site mid aspect 07/29/2019;. Patient is here for review of a wound on his left plantar fifth metatarsal head Electronic Signature(s) Signed: 07/29/2019 5:43:25 PM By: [name] MD Entered By: [name] on 07/29/2019 14:21:11 -------------------------------------------------------------------------------- Debridement Details Patient Name: Date of Service: [name], [name] 07/29/2019 1:15 PM Medical Record [id number] Patient Account Number: [id number] Date of Birth/Sex: [birth year] (73 y.o. M) Treating RN: [name] Primary Care Provider: [name] Other Clinician: Referring Provider: Treating Provider/Extender:[name], [name], [name] in Treatment: 0 Debridement Performed for Wound #5 Left Metatarsal head fifth Assessment: Performed By: Physician Ricard Dillon., MD Debridement Type: Debridement Severity of Tissue Pre Fat layer exposed Debridement: Level of Consciousness (Pre- Awake and Alert procedure): Pre-procedure Verification/Time Out Taken: Yes - 14:07 Start Time: 14:07 Total Area Debrided (L x W): 0.2 (cm) x 0.4 (cm) = 0.08 (cm) Tissue and other material Tissue and other material Viable, Non-Viable,  Callus, Skin: Dermis debrided: Level: Skin/Dermis Debridement Description: Selective/Open Wound Instrument: Curette Bleeding: Minimum Hemostasis Achieved: Pressure End Time: 14:09 Procedural Pain: 0 Post Procedural Pain: 0 Response to Treatment: Procedure was tolerated well Level of Consciousness Awake and Alert (Post-procedure): Post Debridement Measurements of Total Wound Length: (cm) 0.2 Width: (cm) 0.4 Depth: (cm) 0.2 Volume: (cm) 0.013 Character of Wound/Ulcer Post Improved Debridement: Severity of Tissue Post Debridement: Fat layer exposed Post Procedure Diagnosis Same as Pre-procedure Electronic Signature(s) Signed: 07/29/2019 5:43:25 PM By: [name] MD Signed: 10/07/2019 2:58:33 PM By: [name] RN Entered By: [name] on 07/29/2019 14:29:20 -------------------------------------------------------------------------------- HPI Details Patient Name: Date of Service: [name], [name] 07/29/2019 1:15 PM Medical Record [id number] Patient Account Number: [id number] Date of Birth/Sex: Treating RN: 1946-09-03 (72 y.o. [name] Primary Care Provider: [name] Other Clinician: Referring Provider: Treating Provider/Extender:[name], [name], [name] in Treatment: 0 History of Present Illness HPI Description: 05/18/16; this is a 73year-old diabetic who is a type II diabetic on insulin. The history is that he traumatized his right foot developed a sore sometime in late March. Shortly thereafter he went on a cruise but he had to get off the cruise ship in [place] and fly urgently back to [place] where he was admitted to [facility] and ultimately underwent a transmetatarsal amputation by Dr. 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 According to the patient and his wife this wound never really healed. He was seen on 2 occasions in the wound care center in [place] and had vascular studies and then was referred urgently to Dr.  Bridgett Larsson of vascular surgery. He underwent an angiogram on 05/10/16. Unfortunately nothing really could be done to improve his vascular status. He had a 75-90% stenosis in the midsegment of 1 segment of the posterior femoral artery. He had a patent popliteal, his anterior tibial occluded shortly after takeoff. Perineal had a greater than 90% stenosis posterior tibial is occluded feet had no distal collaterals feed distal  aspect of the transmetatarsal amputation site. The patient tells me that he had a prolonged period of Santyl by Dr. Doran Durand was some initial improvement but then this was stopped. I think they're only applying daily dressings/dry dressings. He has not had a recent x-ray of the right foot he did have one before his surgery in April. His wife by the dimensions of the wound/surgical site being followed at home since 4/20. At that point the dimensions were 0.5 x 12 x 0.2 on 7/19 this was 1.8 x 6 x 0.4. He is not currently on any antibiotics. His hemoglobin A1c in early April was 12.9 at that point he was started on insulin. Apparently his blood sugars are much lower he has an appointment with Dr. Legrand Como Alteimer of endocrine next week. 05/29/16 x-ray of the area did not show osteomyelitis. I think he probably needs an MRI at this point. His wife is asking about something called"Yireh" cream which is not FDA approved. I have not heard of this. 06/22/16; MRI did not really suggest osteomyelitis. There was minimal marrow edema and enhancement in the stump of the second metatarsal felt to be secondary likely to postoperative change rather than osteomyelitis. The patient has arranged his own consultation with Dr. Andree Elk at [place], apparently their daughter lives in [place] and has some connection here. Any improvement in vascular supply by Dr. Andree Elk would of course be helpful. Dr. Bridgett Larsson did not feel that anything further could be done other than amputation if wound care did not result in healing or if  the area deteriorates. 06/26/16; the patient has been to see Dr. Andree Elk at [place] and had an angiogram. He is going for a procedure on Thursday which will involve catheterization. I'm not sure if this is an anterograde or retrograde approach. He has been using Santyl to the wound 07/10/16; the patient had a repeat angiogram and angioplasty at [place] by Dr. Brunetta Jeans. His angiogram showed right CFA and profundal widely patent. The right as of a.m. popliteal artery were widely patent the right anterior tibial was occluded proximally and reconstitutes at the ankle. Peroneal artery was patent to the foot. Posterior tibial artery was occluded. The patient had angioplasty of the anterior tibial artery.. This was quite successful. He was recommended for Plavix as well as aspirin. 07/17/16; the patient was close to be a nurse visit today however the outer dressing of the Apligraf fell off. Noted drainage. I was asked to see the wound. The patient is noted an odor however his wife had noted that. Drainage with Apligraf not necessarily a bad thing. He has not been systemically unwell 07/24/16; we are still have an issue with drainage of this wound. In spite of this I applied his second Apligraf. Medially the area still is probing to bone. 08/07/16; Apligraf reapplied in general wound looks improved. 08/21/16 Apligraf #4. Wound looks much better 09/04/16 patientt's wound again today continues to appear to improve with the application of the Apligraf's. He notes no increased discomfort or concerns at this point in time. 09/18/16; the patient returns today 2 weeks after his fifth application of Apligraf. Predictably three quarters of the width of this wound has healed. The deep area that probe to bone medially is still open. The patient asked how much out-of-pocket dollars would be for additional Apligraf's. 09/25/16; now using Hydrofera Blue. He has completed 5 Apligraf applications with considerable improvement in  this deep open transmetatarsal amputation site. His wound is now a triangular-shaped wound on the  medial aspect. At roughly 12 to 2:00 this probes another centimeter but as opposed to in the past this does not probe to bone. The patient has been seen at [place] by Dr. Zenia Resides. He is not planning to do any more revascularization unless the wound stalls or worsens per the patient 10/02/16; 0.7 x 0.8 x 0.8. Unfortunately although the wound looks stable to improved. There is now easily probable bone. This hasn't been present for several weeks. Patient is not otherwise symptomatic he is not experiencing any pain. I did a culture of the wound bed 10/09/16. Deterioration last week. Culture grew MRSA and although there is improvement here with doxycycline prescribed over the phone I'm going to try to get him linezolid 600 twice a day for 10 days today. 10/16/16; he is completing a weeks worth of linezolid and still has 3 more days to go. Small triangular-shaped open area with some degree of undermining. There is still palpable bone with a curet. Overall the area appears better than last week 10/20/16 he has completed the linezolid still having some nausea and vomiting but no diarrhea. He has exposed bone this week which is a deterioration. 10/27/16 patient now has a small but probing wound down to bone. Culture of this bone that I did last week showed a few methicillin-resistant staph aureus. I have little doubt that this represents acute/subacute osteomyelitis. The patient is currently on Doxy which I will continue he also completed 10 days of linezolid. We are now in a difficult situation with this patient's foot after considerable discussion we will send him back to see Dr. Doran Durand for a surgical opinion of this I'm also going to try to arrange a infectious disease consult at [facility] hopefully week and get this prior to her usual 4-6 weeks we having [place]. The patient clearly is going to need 6 weeks of  IV vancomycin. If we cannot arrange this expediently I'll have to consider ordering this myself through a home infusion company 11/03/16; the patient now has a small in terms of circumference but probing wound. No bone palpable today. The patient remains on doxycycline 100 twice a day which should support him until he sees infectious disease at [facility] next week the following week on Wednesday I believe he has an appointment with Dr. Andree Elk at [facility] who is his vascular cardiologist. Finally he has an appointment with Dr. Doran Durand on 11/22/16 we have been using silver alginate. The patient's wife states they are having trouble getting this through [facility] 11/13/16; the patient was seen by infectious disease at [facility] in the 11th PICC line placed in preparation for IV antibiotics. A tummy he has not going to get IV vancomycin o Ceftaroline. They've also ordered an MRI. Patient has a follow-up with Dr. Doran Durand on 11/22/16 and Dr. Andree Elk at [facility] tomorrow 11/23/16 the patient is on daptomycin as directed by infectious disease at [facility]. He is also been back to see Dr. Andree Elk at [facility]. He underwent a repeat arteriogram. He had a successful PTA of the right anterior tibial artery. He is on dual antiplatelete treatment with Plavix and aspirin. Finally he had the MRI of his foot in [place]. This showed cellulitis about the foot worse distally edema and enhancement in the reminiscent of the second metatarsal was consistent with osteomyelitis therefore what I was assuming to be the first metatarsal may be actually the second. He also has a fluid collection deep to the calcaneus at the level of the calcaneal spur which  could be an abscess or due to adventitial bursitis. He had a small tear in his Achilles 11/30/16; the patient continues on daptomycin as directed by infectious disease at [facility]. He is been revascularized by Dr. Andree Elk at [facility] in [place]. He has been to see Gretta Arab who was  the orthopedic surgeon who did his original amputation. I have not seen his not however per the patient's wife he did not offer another surgical local surgical prodecure to remove involved bone. He verbalized his usual disbelief in not just hyperbarics but any medical therapy for this condition(osteomyelitis). I discussed this in detail with the patient today including answering the question about a BKA definitively "curing" the current condition. 12/07/16; the patient continues on daptomycin as directed by infectious disease at [facility]. This is directed at the MRSA that we cultured from his bone debridement from 12/22. Lab work today shows a white count of 8.7 hemoglobin of 10.5 which is microcytic and hypochromic differential count shows a slightly elevated monocyte count at 1.2 eosinophilic count of 0.6. His creatinine is 1.11 sedimentation rate apparently is gone from 35-34 now 40. I explained was wife I don't think this represents a trend. His total CK is 48 12/14/16- patient is here for follow-up evaluation of his right TMA site. He continues to receive IV daptomycin per infectious disease. His serum inflammatory markers remain elevated. He complains of intermittent pain to the medial aspect of the TMA site with intermittent erythema. He voices no complaints or concerns regarding hyperbaric therapy. Overall he and his wife are expressing a frustration and discouragement regarrding the length of time of treatment. 12/21/16; small open wound at roughly the first or second metatarsal metatarsalphalyngeal joint reminiscence of his transmetatarsal amputation site he continues to receive IV daptomycin per infectious disease at [facility]. He will finish these a week tomorrow. He has lab work which I been copied on. His white count is 10.8 hemoglobin 8.9 MCV is low at 75., MCH low at 24.5 platelet count slightly elevated at 626. Differential count shows 70% neutrophils 10% monocytes and 10% eosinophils.  His comprehensive metabolic panel shows a slightly low sodium at 133 albumin low at 3.1 total CK is normal at 42 sedimentation rate is much higher at 82. He has had iron studies that show a serum iron of 15 and iron binding capacity of 238 and iron saturation of 6. This is suggestive of iron deficiency. B12 and folate were normal ferritin at 113 The patient tells me that he is not eating well and he has lost weight. He feels episodically nauseated. He is coughing and gagging on mucus which she thinks is sinusitis. He has an appointment with his primary doctor at 5:00 this afternoon in [facility] 01/02/17; the patient developed a subacute pneumonitis. He was admitted to [facility] after a CT scan showed an extensive interstitial pneumonitis [I have not yet seen this]. 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 He has since been discharged. He is not on oxygen. He feels fatigued and very short of breath with exertion. At [facility] the wound care nurse there felt that his wound was healed. He did complete his daptomycin and has follow-up with infectious disease on Friday. X-rays I did before he went to [facility] still suggested residual osteomyelitis in the anterior aspect of the second must metatarsal head. I'm not sure I would've expected any different. There was no other findings. I  actually think I did this because of erythema over the first metatarsal head reminiscent 01/11/17; the patient has eosinophilic pneumonitis. He has been reviewed by pulmonology and given clearance for hyperbarics at least that's what his wife says. I'll need to see if there is note in care everywhere. Apparently the prognosis for improvement of daptomycin induced eosinophilic granulocyte is is 3 months without steroids. In the meantime infectious disease has placed him on doxycycline until the wound is closed. He is  still is lost a lot of weight and his blood sugars are running in the mid 60s to low 80s fasting and at all times during the day 01/18/17; he has had adjustments in his insulin apparently his blood sugars in the morning or over 100. He wants to restart his hyperbaric treatment we'll do this at 1:00. 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 I think there is good reason to complete his treatments in order to give him the best chance of maintaining a healed status and these DFU 3 wounds with MRSA infection in the bone 02/15/17; the patient was seen today in conjunction with HBO. He completed hyperbaric oxygen today. The open area on his transmetatarsal site has remained closed. There was an area of erythema when I saw him earlier in the week on the posterior heel although that is resolved as of today as well. He has been using a cam walker. This is a patient who came to [place] after a transmetatarsal amputation that was necrotic and dehisced. He required revascularization percutaneously on 2 different occasions by Dr. Andree Elk of invasive cardiology at [facility]. 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 He went to [facility] infectious disease and had IV daptomycin for 5 weeks before developing eosinophilic pneumonitis and requiring an admission to hospital/ICU. He made a good recovery and is continued on doxycycline since. As mentioned his foot is closed now. He has a follow-up with Dr. Andree Elk tomorrow. He is going to [employer] on Monday for a custom-made shoe READMISSION Last visit Dr. Andree Elk [facility] 02/16/17 1. Critical limb ischemia of the RLE: s/p transmetatarsal amputation of the RLE, now completely healed. s/p right ATA percutaneous revascularization procedure x2, most recently 11/20/2016 s/p PTA of right AT 100% to  less than 20% with a 2.5 x 200 balloon. He does have some residual osteomyelitis, but given the wound is closed, the orthopedist has recommended to follow. He has a fitting for a special shoe coming up next week. He has completed a course of daptomycin and doxycycline and he has been discharged by ID. He has completed a course of hyperbaric therapy. Continue Continue medical management with aspirin, Plavix and statin therapy. We will discuss ongoing Plavix therapy at follow-up in 6 months. 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 Given no symptoms, we will conservatively manage. He doesn't want to do any more invasive studies at this time, which is reasonable. The patient arrives today out of 2 concerns both on the right transmetatarsal site. 1 at the level of the reminiscent fifth metatarsal head and the other at roughly the first or second. Both of these look like dark subcutaneous discoloration probably subdermal bleeding. He is recently obtained new adaptive footwear for the right foot. He also has  a callus on the left fifth dorsal toe however he follows with podiatry for this and I don't think this is any issue. ABIs in this clinic today were 0.66 on the right and 1.06 on the left. On entrance into our clinic initially this was 0.95 and 0.92. He does not describe current claudication. They're going away on a cruise in 8 weeks and I think are trying to do the month is much as they can proactively. They follow with podiatry and have an appointment with Dr. Andree Elk in October READMISSION 06/25/18 This is a patient that we have not seen in almost a year. He is a type II diabetic with known PAD. He is followed by Dr. Andree Elk of interventional cardiology at [facility] in [place]. He is required revascularization for significant PAD. When we first saw him he required a  transmetatarsal amputation. He had underlying osteomyelitis with a nonhealing surgical wound. This eventually closed with wound care, IV antibiotics and hyperbaric oxygen. They tell me that he has a modified shoe and he is very active walking up to 4 miles a day. He is followed by Dr. Geroge Baseman of podiatry. His wife states that he underwent a removal of callus over this site on July 9. She felt there may be drainage from this site after that although she could never really determined and there was callus buildup again. On 06/01/18 there was pressure bleeding through the overlying callus. he was given a prescription for 7 days of Bactrim. Fortuitously he has an appointment with Dr. Andree Elk on 06/04/18 and he immediately underwent revascularization of the right leg although I have not had a chance to review these records in care everywhere. This was apparently done through anterior and retrograde access. On 06/06/18 he had another debridement by Dr. Bernette Mayers. Vitamin soaking with Epsom salts for 20 minutes and applying calcium alginate. The original trans-met was on April 2017 I believe by Dr. Doran Durand. His ABI in our clinic was noncompressible today. 07/02/18; x-ray I ordered last week was negative for osteomyelitis. Swab culture was also negative. He is going to require an MRI which I have ordered today. 07/09/18; surprisingly the MRI of the foot that I ordered did not show osteomyelitis. He did suggest the possibility of cellulitis. For this reason I'll go ahead and give him a 10 day course of doxycycline. Although the previous culture of this area was negative 07/16/18; it arrives with the wound looking much the same. Roughly the same depth. He has thick subcutaneous tissue around the wound orifice but this still has roughly the same depth. Been using silver alginate. We applied Oasis #1 today 07/23/2018; still having to remove a lot of callus and thick subcutaneous tissue to actually define the wound here. Most  of this seems to have closed down yet he has a comma shaped divot over the top of the area that still I think is open. We applied Oasis #2 His wife expressed concern about the tip of his left great toe. This almost looks like a small blister. She also showed it today to her podiatrist Dr. Geroge Baseman who did not think this was anything serious. I am not sure is anything serious either however I think it bears some watching. He is not in any pain however he is insensate 07/30/2018;; still on a lot of nonviable tissue over the surface of the wound however cleaning this up reveals a more substantial wound orifice but less of a probing wound depth. This is not probed to  bone. There is no evidence of infection The wife is still concerned about a non-open area on the tip of his left great toe. Almost feels like a bony outgrowth. She had previously showed this to podiatry. I do not think this is a blister. A friction area would be possible although he is really not walking according to his wife. He had a small skin tag on his right buttock but no open wound here either 08/06/2018; we applied a third Oasis last week. Unfortunately there is really no improvement. Still requiring extensive debridement to expose the wound bed from a horizontal slitlike depression. I still have not been able to get this to fill in properly. On the positive side there is now no probable bone from when he first came into the facility. I changed him to silver alginate today after a reasonably aggressive debridement 08/13/2018; once again the patient comes in with skin and subcutaneous tissue closing over the small probing area with the underlying cavity of the wound on the right TMA site. I applied silver alginate to this last week. Prior to that we used Oasis x3 still not able to get this area granulating. He does not have a probing area of the bone which is an improvement from when I spur started working on this and an MRI did not  suggest osteomyelitis. I do not see evidence of infection here but I am increasingly concerned about why I cannot get this area to granulate. Each time I debrided this is looks like this is simply a matter of getting granulation to fill in the hole and then getting epithelialization. This does not seem to happen Also I sent him back to see podiatry Dr. Earleen Newport about the what felt to be bony outgrowth on the tip of his left great toe. Apparently after heel he left the clinic last week or the next day he developed a blood blister. He did see Dr. Earleen Newport. He went on to have a debridement of the medial nail cuticle he now has an open area here as well as some denuded skin. They did an x-ray apparently does have a bony outgrowth or spur but I am not able to look at this. They are using topical antibiotics apparently there was some suggested he use Santyl. Patient's wife was anxious for my opinion of this 08/20/2018; we are able to keep the wound open this time instead of the thick subcutaneous tissue closing over the top of it however unfortunately once again this probes to bone. I do not see any evidence of infection and previous MRI did not show osteomyelitis. I elected to go back to the Oasis to see if we can stimulate some granulation. Last saw his vascular interventional cardiologist Dr. Andree Elk at the beginning of August and he had a repeat procedure. Nevertheless I wonder how much blood flow he has down to this area With regards to the left first toe he is seeing Dr. Earleen Newport next week. They are applying Bactroban to this area. 08/27/2018 Once again he comes in with thick eschar and subcutaneous tissue over the top of the small probing hole. This does not appear to go down to bone but it still has roughly the same depth. I reapplied Oasis today Over the left great toe there appears to be more of the wound at the tip of his toe than there was last week. This has an eschar on the surface of it. Will  change to Santyl. There is seeing podiatry this  afternoon 09/03/2018 He comes in today with the area on the transmetatarsal's site with a fair amount of callus, nonviable tissue over the circumference but it was not closed. I removed all of this as well as some subcutaneous debris and reapplied Oasis. There is no exposed bone The area over the tip of the left great toe started off as a nodule of uncertain etiology. He has been followed with podiatry. They have been removing part of the medial nail bed. He has nonviable tissue over the wound he been using Santyl in this area 09/10/18 Unfortunately comes in with neither wound area looking improved. The transmetatarsal amputation site once Again has nonviable debris over the surface requiring debridement. Unfortunately underneath this there is nothing that looks viable and this once again goes right down to bone. There is no purulent drainage and no erythema. Also the surgical wound from podiatry on the left first toe has an ischemic-looking eschar over the surface of the tip of the toe I have elected not to attempt candidly debride this His wife as arranged for him to have follow-up noninvasive studies in [place] under the care of Dr. Andree Elk clinic. The question is he has known severe PAD. They had recently seen him in August. He has had revascularizations in both legs within the last 4 or 5 months. He is not complaining of pain and I cannot really get a history of claudication. He has not systemically unwell 09/20/2018 the patient has been to [facility] and been revascularized by Dr. Andree Elk earlier this week. Apparently he was able to open up the anterior tibial artery although I have not actually seen his formal report. He is going for an attempt to revascularize on the left on Monday. He is apparently working with an investigational stent for lower extremity arteries below the knee and he has talked to the patient about placing that on Monday if  possible. The patient has been using silver alginate on the transmetatarsal amputation site and Santyl on the left 09/30/2018; patient had his revascularization on the left this apparently included a standard approach as well as a more distal arterial catheterization although I do not have any information on this from Dr. Andree Elk. In fact I do not even see the initial revascularization that he had on the right. I have included the arterial history from Dr. Andree Elk last note however below; ASSESSMENT/PLAN: 1. Hx of Critical limb ischemia bilateral lower extremities, PAD: -s/p transmetatarsal amputation of the RLE. -s/p right ATA percutaneous revascularization procedure x2, most recently 11/20/2016 s/p PTA of right AT 100% to less than 20% with a 2.5 x 200 balloon. 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 -03/21/2018 s/p PTA of 90% left popliteal artery to <10% with a 5x20 cutting balloon, PTA of 90% left peroneal to <20% with a 3x20 balloon, PTA of 100% left AT to <20% with a 2.5x220 balloon. 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 Will plan on the LLE soon after. Risks/benefits of procedure have been discussed and patient has elected to proceed. We have been using endoform to the right TMA amputation site wound and Santyl to the left great toe 10/07/2018; the area on the tip of his left great toe looked better we have  been using Santyl here. We continue to have a very difficult probing hole on the right TMA amputation site we have been using endoform. I went on to use his fifth Oasis today 10/14/2018; the tip of the left great toe continues to look better. We have been using Santyl here the surface however is healthy and  I think we can change to an alginate. The right TMA has not changed. Once again he has no superficial opening there is callus and thick subcutaneous tissue over the orifice once you remove this there is the probing area that we have been dealing with without too much change. There is no palpable bone I have been placing Oasis here and put Oasis #6 in this today after a more vigorous debridement 10/21/18; the left great toe still has necrotic surface requiring debridement. The right TMA site hasn't changed in view of the thick callus over the wound bed. With removal of this there is still the opening however this does not appear to have the same depth. Again there is no palpable bone. Oasis was replaced 10/28/2018; patient comes in with both wounds looking worse. The area over the first toe tip is now down to bone. The area over the TMA site is deeper and down to bone clearly with a increase in overall wound area. Equally concerning on the right TMA is the complete absence of a pulse this week which is a change. We are not even able to Doppler this. On the right he has a noncompressible ABI greater than 1.4 11/04/2018. Both wounds look somewhat worse. X-rays showed no osteomyelitis of the right foot but on the left there was underlying osteomyelitis in the left great toe distal phalanx. I been on the phone to Dr. Andree Elk surface at [facility] in [place] and they are arranging for another angiogram on the right on Monday. I have him on doxycycline for the osteomyelitis in the left great toe for now. Infectious disease may be necessary 1/17; 2-week hiatus. Patient was admitted to hospital at [place]. My understanding is he underwent an angioplasty of the right anterior tibial artery and had stents placed in the left anterior artery and the left tibial peroneal trunk. This was done by Dr. Andree Elk of interventional radiology. There is no major change in either 1 of the wounds. They have been using Aquacel Ag.  As far as they are aware no imaging studies were done of the foot which is indeed unfortunate. I had him on doxycycline for 2 weeks since we identified the osteomyelitis in the left great toe by plain x-ray. I have renewed that again today. I am still suspicious about osteomyelitis in the amputation site and would consider doing another MRI to compare with the one done in September. The idea of hyperbaric oxygen certainly comes up for discussion 1/24; no major change in either wound area. I have him on doxycycline for osteomyelitis at the tip of the left great toe. As noted he has been previously and recently revascularized by Dr. Andree Elk at [place]. He is tolerating the doxycycline well. For some reason we do not have an infectious disease consult yet. Culture of drainage from the right foot site last week was negative 1/31; MRI of the right foot did not show osteomyelitis of the right ankle and foot. Notable for a skin ulceration overlying the second metatarsal stump with generalizing soft tissue edema of the ankle and foot consistent with cellulitis. Noted to have a partial-thickness tear of the Achilles tendon  7.5 cm proximal to the insertion Nothing really new in terms of symptoms. Patient's area on the tip of the left great toe is just about closed although he still has the probing area on the metatarsal amputation site. Appointment with Dr. Linus Salmons of infectious disease next week 2/7; Dr. Novella Olive did not feel that any further antibiotics were necessary he would follow-up in 2 months. The left great toe appears to be closed still some surface callus that I gently looked under high did not see anything open or anything that was threatening to be open. He still has the open area on the mid part of his TMA site using endoform 2/14; left great toe is closed and he is completing his doxycycline as of last Sunday. He is not on any antibiotics. Unfortunately out of the right foot his wife noticed some  subdermal hemorrhage this week. He had been walking 2 miles I had given him permission to do so this is not really a plantar wound. Using endoform to this wound but I changed to silver alginate this week 2/21; left great toe remains closed. Culture last week grew Streptococcus angiosis which I am not really familiar with however it is penicillin sensitive and I am going to put him on Augmentin. Not much change in the wound on the right foot the deep area is still probing precariously close to bone and the wound on the margin of the TMA is larger. 2/28; left great toe remains closed. He is completing the Augmentin I gave him last week. 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 This is being shown to Dr. Andree Elk at [place] to see if there is anything else that can be done here. He has been using silver alginate strips on the right 3/6; left great toe remains closed. He sees Dr. Andree Elk on Monday. The area on the plantar aspect of the right foot has the same small orifice with thick callused tissue around this. However this time with removal of the callus tissue the wound is open all the way along the incision line to the end medially. We have been using silver alginate. 3/13; left toe remains closed although the area is callused. He sees Dr. Jacqualyn Posey of podiatry next week. The area on the plantar right foot looked a lot better this week. Culture I did of this was negative we use silver alginate. He is going next week for an attempt at revascularization by Dr. Andree Elk 3/23; left toe remains closed although the area is callused. He will see Dr. Jacqualyn Posey in follow-up. The area on the right plantar foot continues to look surprisingly better over the last 3 visits. We have been using silver alginate. The revascularization he was supposed to have by Dr. Andree Elk at [facility] in [place] has been canceled [facility] procedure] 4/6; the right foot remains closed albeit callused.  Podiatry canceled the appointment with regards to the left great toe. He has not seen Dr. Andree Elk at [facility] but thinks that Dr. Andree Elk has "done all he can do". He would be a candidate for the hemostaemix trial Readmission 07/29/2019 Mr. Stann Mainland is a man we know well from at least 3 previous stays in this clinic. He is a type II diabetic with severe PAD followed by Dr. Andree Elk at [facility] in [place]. During his last stay here he had a probing wound bone in his right TMA site and a episode of osteomyelitis on the tip of the left great toe  at a surgical site. So far everything in both of these areas has remained closed. About 2 weeks ago he went to [facility] pod[facility] Babs Bertin. He had a thick callus on the left fifth metatarsal head that was shaved. He has developed an open wound in this area. They have been offloading this in his diabetic shoes. The patient has not had any more revascularizations by Dr. Andree Elk since the last time he was here. ABI in our clinic at the posterior tibial on the left was 1.06 Electronic Signature(s) Signed: 07/29/2019 5:43:25 PM By: [name] MD Entered By: [name] on 07/29/2019 14:23:04 -------------------------------------------------------------------------------- Physical Exam Details Patient Name: Date of Service: [name], [name] 07/29/2019 1:15 PM Medical Record [id number] Patient Account Number: [id number] Date of Birth/Sex: Treating RN: 07-26-1946 (72 y.o. [name] Primary Care Provider: [name] Other Clinician: Referring Provider: Treating Provider/Extender:[name], [name], [name] in Treatment: 0 Constitutional Appears in no distress. Eyes Conjunctivae clear. No discharge.no icterus. Respiratory work of breathing is normal. Cardiovascular Faint left posterior tibial greater than right posterior tibial pulses palpable. I could not feel his dorsalis pedis pulse. Integumentary  (Hair, Skin) No evidence of infection around the wound. Psychiatric appears at normal baseline. Notes Wound exam; the patient has a small area over the plantar left fifth metatarsal head. Undermining superiorly by 1 or 2 me millimeters. Using a #3 curette the skin over this area was removed. There was no bleeding. This does not probe to bone. No surrounding erythema is seen. Electronic Signature(s) Signed: 07/29/2019 5:43:25 PM By: [name] MD Entered By: [name] on 07/29/2019 14:24:32 -------------------------------------------------------------------------------- Physician Orders Details Patient Name: Date of Service: [name], [name] 07/29/2019 1:15 PM Medical Record [id number] Patient Account Number: [id number] Date of Birth/Sex: Treating RN: [birth year] (72 y.o. [name] Primary Care Provider: [name] Other Clinician: Referring Provider: Treating Provider/Extender:[name], [name], [name] in Treatment: 0 Verbal / Phone Orders: No Diagnosis Coding Follow-up Appointments Return Appointment in 1 week. Dressing Change Frequency Change dressing every day. Wound Cleansing Wound #5 Left Metatarsal head fifth Clean wound with Wound Cleanser Primary Wound Dressing Wound #5 Left Metatarsal head fifth Calcium Alginate with Silver Secondary Dressing Wound #5 Left Metatarsal head fifth Kerlix/Rolled Gauze - secure with tape Dry Gauze Other: - felt to cushion shoe Off-Loading Other: - surgical shoe with felt Electronic Signature(s) Signed: 07/29/2019 5:43:25 PM By: [name] MD Signed: 10/07/2019 2:58:33 PM By: [name] RN Entered By: [name] on 07/29/2019 14:36:21 -------------------------------------------------------------------------------- Problem List Details Patient Name: Date of Service: [name], [name] 07/29/2019 1:15 PM Medical Record [id number] Patient Account Number: [id number] Date of Birth/Sex: Treating  RN: [birth year] (72 y.o. [name] Primary Care Provider: [name] Other Clinician: Referring Provider: Treating Provider/Extender:[name], [name], [name] in Treatment: 0 Active Problems ICD-10 Evaluated Encounter Code Description Active Date Today Diagnosis E11.621 Type 2 diabetes mellitus with foot ulcer 07/29/2019 No Yes E11.51 Type 2 diabetes mellitus with diabetic peripheral 07/29/2019 No Yes angiopathy without gangrene L97.521 Non-pressure chronic ulcer of other part of left foot 07/29/2019 No Yes limited to breakdown of skin Inactive Problems Resolved Problems Electronic Signature(s) Signed: 07/29/2019 5:43:25 PM By: [name] MD Entered By: [name] on 07/29/2019 14:18:47 -------------------------------------------------------------------------------- Progress Note Details Patient Name: Date of Service: [name], [name] 07/29/2019 1:15 PM Medical Record [id number] Patient Account Number: [id number] Date of Birth/Sex: Treating RN: 1946/01/28 (72 y.o. [name] Primary Care Provider: [name] Other Clinician: Referring Provider: Treating  Provider/Extender:[name], [name], Lennette Bihari Weeks in Treatment: 0 Subjective Chief Complaint Information obtained from Patient Patient here for review of a nonhealing wound at a right transmetatarsal amputation site 07/09/17; patient is here out of concerns for 2 spots on his right transmetatarsal site 06/25/18. Patient is here for an area on his right transmetatarsal amputation site mid aspect 07/29/2019;. Patient is here for review of a wound on his left plantar fifth metatarsal head History of Present Illness (HPI) 05/18/16; this is a 73year-old diabetic who is a type II diabetic on insulin. The history is that he traumatized his right foot developed a sore sometime in late March. Shortly thereafter he went on a cruise but he had to get off the cruise ship in [place] and fly urgently  back to [place] where he was admitted to [facility] and ultimately underwent a transmetatarsal amputation by Dr. 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 According to the patient and his wife this wound never really healed. He was seen on 2 occasions in the wound care center in [place] and had vascular studies and then was referred urgently to Dr. Bridgett Larsson of vascular surgery. He underwent an angiogram on 05/10/16. Unfortunately nothing really could be done to improve his vascular status. He had a 75-90% stenosis in the midsegment of 1 segment of the posterior femoral artery. He had a patent popliteal, his anterior tibial occluded shortly after takeoff. Perineal had a greater than 90% stenosis posterior tibial is occluded feet had no distal collaterals feed distal aspect of the transmetatarsal amputation site. The patient tells me that he had a prolonged period of Santyl by Dr. Doran Durand was some initial improvement but then this was stopped. I think they're only applying daily dressings/dry dressings. He has not had a recent x-ray of the right foot he did have one before his surgery in April. His wife by the dimensions of the wound/surgical site being followed at home since 4/20. At that point the dimensions were 0.5 x 12 x 0.2 on 7/19 this was 1.8 x 6 x 0.4. He is not currently on any antibiotics. His hemoglobin A1c in early April was 12.9 at that point he was started on insulin. Apparently his blood sugars are much lower he has an appointment with Dr. Legrand Como Alteimer of endocrine next week. 05/29/16 x-ray of the area did not show osteomyelitis. I think he probably needs an MRI at this point. His wife is asking about something called"Yireh" cream which is not FDA approved. I have not heard of this. 06/22/16; MRI did not really suggest osteomyelitis. There was minimal marrow edema and enhancement in the stump of the second metatarsal felt to be secondary likely to postoperative change rather than  osteomyelitis. The patient has arranged his own consultation with Dr. Andree Elk at [place], apparently their daughter lives in [place] and has some connection here. Any improvement in vascular supply by Dr. Andree Elk would of course be helpful. Dr. Bridgett Larsson did not feel that anything further could be done other than amputation if wound care did not result in healing or if the area deteriorates. 06/26/16; the patient has been to see Dr. Andree Elk at [place] and had an angiogram. He is going for a procedure on Thursday which will involve catheterization. I'm not sure if this is an anterograde or retrograde approach. He has been using Santyl to the wound 07/10/16; the patient had a repeat angiogram and angioplasty at [place] by Dr. Brunetta Jeans. His angiogram showed right CFA and profundal widely patent.  The right as of a.m. popliteal artery were widely patent the right anterior tibial was occluded proximally and reconstitutes at the ankle. Peroneal artery was patent to the foot. Posterior tibial artery was occluded. The patient had angioplasty of the anterior tibial artery.. This was quite successful. He was recommended for Plavix as well as aspirin. 07/17/16; the patient was close to be a nurse visit today however the outer dressing of the Apligraf fell off. Noted drainage. I was asked to see the wound. The patient is noted an odor however his wife had noted that. Drainage with Apligraf not necessarily a bad thing. He has not been systemically unwell 07/24/16; we are still have an issue with drainage of this wound. In spite of this I applied his second Apligraf. Medially the area still is probing to bone. 08/07/16; Apligraf reapplied in general wound looks improved. 08/21/16 Apligraf #4. Wound looks much better 09/04/16 patientt's wound again today continues to appear to improve with the application of the Apligraf's. He notes no increased discomfort or concerns at this point in time. 09/18/16; the patient returns today 2 weeks  after his fifth application of Apligraf. Predictably three quarters of the width of this wound has healed. The deep area that probe to bone medially is still open. The patient asked how much out-of-pocket dollars would be for additional Apligraf's. 09/25/16; now using Hydrofera Blue. He has completed 5 Apligraf applications with considerable improvement in this deep open transmetatarsal amputation site. His wound is now a triangular-shaped wound on the medial aspect. At roughly 12 to 2:00 this probes another centimeter but as opposed to in the past this does not probe to bone. The patient has been seen at [place] by Dr. Zenia Resides. He is not planning to do any more revascularization unless the wound stalls or worsens per the patient 10/02/16; 0.7 x 0.8 x 0.8. Unfortunately although the wound looks stable to improved. There is now easily probable bone. This hasn't been present for several weeks. Patient is not otherwise symptomatic he is not experiencing any pain. I did a culture of the wound bed 10/09/16. Deterioration last week. Culture grew MRSA and although there is improvement here with doxycycline prescribed over the phone I'm going to try to get him linezolid 600 twice a day for 10 days today. 10/16/16; he is completing a weeks worth of linezolid and still has 3 more days to go. Small triangular-shaped open area with some degree of undermining. There is still palpable bone with a curet. Overall the area appears better than last week 10/20/16 he has completed the linezolid still having some nausea and vomiting but no diarrhea. He has exposed bone this week which is a deterioration. 10/27/16 patient now has a small but probing wound down to bone. Culture of this bone that I did last week showed a few methicillin-resistant staph aureus. I have little doubt that this represents acute/subacute osteomyelitis. The patient is currently on Doxy which I will continue he also completed 10 days of linezolid. We  are now in a difficult situation with this patient's foot after considerable discussion we will send him back to see Dr. Doran Durand for a surgical opinion of this I'm also going to try to arrange a infectious disease consult at [facility] hopefully week and get this prior to her usual 4-6 weeks we having [place]. The patient clearly is going to need 6 weeks of IV vancomycin. If we cannot arrange this expediently I'll have to consider ordering this myself through a home infusion  company 11/03/16; the patient now has a small in terms of circumference but probing wound. No bone palpable today. The patient remains on doxycycline 100 twice a day which should support him until he sees infectious disease at [facility] next week the following week on Wednesday I believe he has an appointment with Dr. Andree Elk at [facility] who is his vascular cardiologist. Finally he has an appointment with Dr. Doran Durand on 11/22/16 we have been using silver alginate. The patient's wife states they are having trouble getting this through [facility] 11/13/16; the patient was seen by infectious disease at [facility] in the 11th PICC line placed in preparation for IV antibiotics. A tummy he has not going to get IV vancomycin o Ceftaroline. They've also ordered an MRI. Patient has a follow-up with Dr. Doran Durand on 11/22/16 and Dr. Andree Elk at [facility] tomorrow 11/23/16 the patient is on daptomycin as directed by infectious disease at [facility]. He is also been back to see Dr. Andree Elk at [facility]. He underwent a repeat arteriogram. He had a successful PTA of the right anterior tibial artery. He is on dual antiplatelete treatment with Plavix and aspirin. Finally he had the MRI of his foot in [place]. This showed cellulitis about the foot worse distally edema and enhancement in the reminiscent of the second metatarsal was consistent with osteomyelitis therefore what I was assuming to be the first metatarsal may be actually the second. He also has a fluid  collection deep to the calcaneus at the level of the calcaneal spur which could be an abscess or due to adventitial bursitis. He had a small tear in his Achilles 11/30/16; the patient continues on daptomycin as directed by infectious disease at [facility]. He is been revascularized by Dr. Andree Elk at [facility] in [place]. He has been to see Gretta Arab who was the orthopedic surgeon who did his original amputation. I have not seen his not however per the patient's wife he did not offer another surgical local surgical prodecure to remove involved bone. He verbalized his usual disbelief in not just hyperbarics but any medical therapy for this condition(osteomyelitis). I discussed this in detail with the patient today including answering the question about a BKA definitively "curing" the current condition. 12/07/16; the patient continues on daptomycin as directed by infectious disease at [facility]. This is directed at the MRSA that we cultured from his bone debridement from 12/22. Lab work today shows a white count of 8.7 hemoglobin of 10.5 which is microcytic and hypochromic differential count shows a slightly elevated monocyte count at 1.2 eosinophilic count of 0.6. His creatinine is 1.11 sedimentation rate apparently is gone from 35-34 now 40. I explained was wife I don't think this represents a trend. His total CK is 48 12/14/16- patient is here for follow-up evaluation of his right TMA site. He continues to receive IV daptomycin per infectious disease. His serum inflammatory markers remain elevated. He complains of intermittent pain to the medial aspect of the TMA site with intermittent erythema. He voices no complaints or concerns regarding hyperbaric therapy. Overall he and his wife are expressing a frustration and discouragement regarrding the length of time of treatment. 12/21/16; small open wound at roughly the first or second metatarsal metatarsalphalyngeal joint reminiscence of his transmetatarsal  amputation site he continues to receive IV daptomycin per infectious disease at [facility]. He will finish these a week tomorrow. He has lab work which I been copied on. His white count is 10.8 hemoglobin 8.9 MCV is low at 75., [facility]  low at 24.5 platelet count slightly elevated at 626. Differential count shows 70% neutrophils 10% monocytes and 10% eosinophils. His comprehensive metabolic panel shows a slightly low sodium at 133 albumin low at 3.1 total CK is normal at 42 sedimentation rate is much higher at 82. He has had iron studies that show a serum iron of 15 and iron binding capacity of 238 and iron saturation of 6. This is suggestive of iron deficiency. B12 and folate were normal ferritin at 113 The patient tells me that he is not eating well and he has lost weight. He feels episodically nauseated. He is coughing and gagging on mucus which she thinks is sinusitis. He has an appointment with his primary doctor at 5:00 this afternoon in [facility] 01/02/17; the patient developed a subacute pneumonitis. He was admitted to [facility] after a CT scan showed an extensive interstitial pneumonitis [I have not yet seen this]. 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 He has since been discharged. He is not on oxygen. He feels fatigued and very short of breath with exertion. At [facility] the wound care nurse there felt that his wound was healed. He did complete his daptomycin and has follow-up with infectious disease on Friday. X-rays I did before he went to [facility] still suggested residual osteomyelitis in the anterior aspect of the second must metatarsal head. I'm not sure I would've expected any different. There was no other findings. I actually think I did this because of erythema over the first metatarsal head reminiscent 01/11/17; the patient has eosinophilic pneumonitis. He has been reviewed by  pulmonology and given clearance for hyperbarics at least that's what his wife says. I'll need to see if there is note in care everywhere. Apparently the prognosis for improvement of daptomycin induced eosinophilic granulocyte is is 3 months without steroids. In the meantime infectious disease has placed him on doxycycline until the wound is closed. He is still is lost a lot of weight and his blood sugars are running in the mid 60s to low 80s fasting and at all times during the day 01/18/17; he has had adjustments in his insulin apparently his blood sugars in the morning or over 100. He wants to restart his hyperbaric treatment we'll do this at 1:00. 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 I think there is good reason to complete his treatments in order to give him the best chance of maintaining a healed status and these DFU 3 wounds with MRSA infection in the bone 02/15/17; the patient was seen today in conjunction with HBO. He completed hyperbaric oxygen today. The open area on his transmetatarsal site has remained closed. There was an area of erythema when I saw him earlier in the week on the posterior heel although that is resolved as of today as well. He has been using a cam walker. This is a patient who came to [place] after a transmetatarsal amputation that was necrotic and dehisced. He required revascularization percutaneously on 2 different occasions by Dr. Andree Elk of invasive cardiology at [facility]. 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 He went to [facility] infectious disease and had IV daptomycin for 5 weeks before developing eosinophilic pneumonitis and requiring an admission to hospital/ICU. He made a good recovery and is continued on doxycycline  since. As mentioned his foot is closed now. He has a follow-up with  Dr. Andree Elk tomorrow. He is going to [employer] on Monday for a custom-made shoe READMISSION Last visit Dr. Andree Elk [facility] 02/16/17 1. Critical limb ischemia of the RLE:  s/p transmetatarsal amputation of the RLE, now completely healed.  s/p right ATA percutaneous revascularization procedure x2, most recently 11/20/2016 s/p PTA of right AT 100% to less than 20% with a 2.5 x 200 balloon.  He does have some residual osteomyelitis, but given the wound is closed, the orthopedist has recommended to follow. He has a fitting for a special shoe coming up next week. He has completed a course of daptomycin and doxycycline and he has been discharged by ID. He has completed a course of hyperbaric therapy.  Continue Continue medical management with aspirin, Plavix and statin therapy. We will discuss ongoing Plavix therapy at follow-up in 6 months.  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 Given no symptoms, we will conservatively manage. He doesn't want to do any more invasive studies at this time, which is reasonable. The patient arrives today out of 2 concerns both on the right transmetatarsal site. 1 at the level of the reminiscent fifth metatarsal head and the other at roughly the first or second. Both of these look like dark subcutaneous discoloration probably subdermal bleeding. He is recently obtained new adaptive footwear for the right foot. He also has a callus on the left fifth dorsal toe however he follows with podiatry for this and I don't think this is any issue. ABIs in this clinic today were 0.66 on the right and 1.06 on the left. On entrance into our clinic initially this was 0.95 and 0.92. He does not describe current claudication. They're going away on a cruise in 8 weeks and I think are trying to do the month is much as they can proactively. They  follow with podiatry and have an appointment with Dr. Andree Elk in October READMISSION 06/25/18 This is a patient that we have not seen in almost a year. He is a type II diabetic with known PAD. He is followed by Dr. Andree Elk of interventional cardiology at [facility] in [place]. He is required revascularization for significant PAD. When we first saw him he required a transmetatarsal amputation. He had underlying osteomyelitis with a nonhealing surgical wound. This eventually closed with wound care, IV antibiotics and hyperbaric oxygen. They tell me that he has a modified shoe and he is very active walking up to 4 miles a day. He is followed by Dr. Geroge Baseman of podiatry. His wife states that he underwent a removal of callus over this site on July 9. She felt there may be drainage from this site after that although she could never really determined and there was callus buildup again. On 06/01/18 there was pressure bleeding through the overlying callus. he was given a prescription for 7 days of Bactrim. Fortuitously he has an appointment with Dr. Andree Elk on 06/04/18 and he immediately underwent revascularization of the right leg although I have not had a chance to review these records in care everywhere. This was apparently done through anterior and retrograde access. On 06/06/18 he had another debridement by Dr. Bernette Mayers. Vitamin soaking with Epsom salts for 20 minutes and applying calcium alginate. The original trans-met was on April 2017 I believe by Dr. Doran Durand. His ABI in  our clinic was noncompressible today. 07/02/18; x-ray I ordered last week was negative for osteomyelitis. Swab culture was also negative. He is going to require an MRI which I have ordered today. 07/09/18; surprisingly the MRI of the foot that I ordered did not show osteomyelitis. He did suggest the possibility of cellulitis. For this reason I'll go ahead and give him a 10 day course of doxycycline. Although the previous culture of this area was  negative 07/16/18; it arrives with the wound looking much the same. Roughly the same depth. He has thick subcutaneous tissue around the wound orifice but this still has roughly the same depth. Been using silver alginate. We applied Oasis #1 today 07/23/2018; still having to remove a lot of callus and thick subcutaneous tissue to actually define the wound here. Most of this seems to have closed down yet he has a comma shaped divot over the top of the area that still I think is open. We applied Oasis #2 His wife expressed concern about the tip of his left great toe. This almost looks like a small blister. She also showed it today to her podiatrist Dr. Geroge Baseman who did not think this was anything serious. I am not sure is anything serious either however I think it bears some watching. He is not in any pain however he is insensate 07/30/2018;; still on a lot of nonviable tissue over the surface of the wound however cleaning this up reveals a more substantial wound orifice but less of a probing wound depth. This is not probed to bone. There is no evidence of infection The wife is still concerned about a non-open area on the tip of his left great toe. Almost feels like a bony outgrowth. She had previously showed this to podiatry. I do not think this is a blister. A friction area would be possible although he is really not walking according to his wife. He had a small skin tag on his right buttock but no open wound here either 08/06/2018; we applied a third Oasis last week. Unfortunately there is really no improvement. Still requiring extensive debridement to expose the wound bed from a horizontal slitlike depression. I still have not been able to get this to fill in properly. On the positive side there is now no probable bone from when he first came into the facility. I changed him to silver alginate today after a reasonably aggressive debridement 08/13/2018; once again the patient comes in with skin and  subcutaneous tissue closing over the small probing area with the underlying cavity of the wound on the right TMA site. I applied silver alginate to this last week. Prior to that we used Oasis x3 still not able to get this area granulating. He does not have a probing area of the bone which is an improvement from when I spur started working on this and an MRI did not suggest osteomyelitis. I do not see evidence of infection here but I am increasingly concerned about why I cannot get this area to granulate. Each time I debrided this is looks like this is simply a matter of getting granulation to fill in the hole and then getting epithelialization. This does not seem to happen Also I sent him back to see podiatry Dr. Earleen [facility] about the what felt to be bony outgrowth on the tip of his left great toe. Apparently after heel he left the clinic last week or the next day he developed a blood blister. He did see Dr. Earleen [facility]. He  went on to have a debridement of the medial nail cuticle he now has an open area here as well as some denuded skin. They did an x-ray apparently does have a bony outgrowth or spur but I am not able to look at this. They are using topical antibiotics apparently there was some suggested he use Santyl. Patient's wife was anxious for my opinion of this 08/20/2018; we are able to keep the wound open this time instead of the thick subcutaneous tissue closing over the top of it however unfortunately once again this probes to bone. I do not see any evidence of infection and previous MRI did not show osteomyelitis. I elected to go back to the Oasis to see if we can stimulate some granulation. Last saw his vascular interventional cardiologist Dr. Andree Elk at the beginning of August and he had a repeat procedure. Nevertheless I wonder how much blood flow he has down to this area With regards to the left first toe he is seeing Dr. Earleen [facility] next week. They are applying Bactroban to this  area. 08/27/2018 ooOnce again he comes in with thick eschar and subcutaneous tissue over the top of the small probing hole. This does not appear to go down to bone but it still has roughly the same depth. I reapplied Oasis today ooOver the left great toe there appears to be more of the wound at the tip of his toe than there was last week. This has an eschar on the surface of it. Will change to Santyl. There is seeing podiatry this afternoon 09/03/2018 ooHe comes in today with the area on the transmetatarsal's site with a fair amount of callus, nonviable tissue over the circumference but it was not closed. I removed all of this as well as some subcutaneous debris and reapplied Oasis. There is no exposed bone ooThe area over the tip of the left great toe started off as a nodule of uncertain etiology. He has been followed with podiatry. They have been removing part of the medial nail bed. He has nonviable tissue over the wound he been using Santyl in this area 09/10/18 ooUnfortunately comes in with neither wound area looking improved. The transmetatarsal amputation site once Again has nonviable debris over the surface requiring debridement. Unfortunately underneath this there is nothing that looks viable and this once again goes right down to bone. There is no purulent drainage and no erythema. ooAlso the surgical wound from podiatry on the left first toe has an ischemic-looking eschar over the surface of the tip of the toe I have elected not to attempt candidly debride this ooHis wife as arranged for him to have follow-up noninvasive studies in [place] under the care of Dr. Andree Elk clinic. The question is he has known severe PAD. They had recently seen him in August. He has had revascularizations in both legs within the last 4 or 5 months. He is not complaining of pain and I cannot really get a history of claudication. He has not systemically unwell 09/20/2018 the patient has been to [facility] and been revascularized by Dr. Andree Elk earlier this week. Apparently he was able to open up the anterior tibial artery although I have not actually seen his formal report. He is going for an attempt to revascularize on the left on Monday. He is apparently working with an investigational stent for lower extremity arteries below the knee and he has talked to the patient about placing that on Monday if possible. The patient has been using silver  alginate on the transmetatarsal amputation site and Santyl on the left 09/30/2018; patient had his revascularization on the left this apparently included a standard approach as well as a more distal arterial catheterization although I do not have any information on this from Dr. Andree Elk. In fact I do not even see the initial revascularization that he had on the right. I have included the arterial history from Dr. Andree Elk last note however below; ASSESSMENT/PLAN: 1. Hx of Critical limb ischemia bilateral lower extremities, PAD: -s/p transmetatarsal amputation of the RLE. -s/p right ATA percutaneous revascularization procedure x2, most recently 11/20/2016 s/p PTA of right AT 100% to less than 20% with a 2.5 x 200 balloon. 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 -03/21/2018 s/p PTA of 90% left popliteal artery to <10% with a 5x20 cutting balloon, PTA of 90% left peroneal to <20% with a 3x20 balloon, PTA of 100% left AT to <20% with a 2.5x220 balloon. 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 Will plan on the LLE soon after. Risks/benefits of procedure have been discussed and patient has elected to proceed. We have been using endoform to the right TMA amputation site wound and  Santyl to the left great toe 10/07/2018; the area on the tip of his left great toe looked better we have been using Santyl here. We continue to have a very difficult probing hole on the right TMA amputation site we have been using endoform. I went on to use his fifth Oasis today 10/14/2018; the tip of the left great toe continues to look better. We have been using Santyl here the surface however is healthy and I think we can change to an alginate. The right TMA has not changed. Once again he has no superficial opening there is callus and thick subcutaneous tissue over the orifice once you remove this there is the probing area that we have been dealing with without too much change. There is no palpable bone I have been placing Oasis here and put Oasis #6 in this today after a more vigorous debridement 10/21/18; the left great toe still has necrotic surface requiring debridement. The right TMA site hasn't changed in view of the thick callus over the wound bed. With removal of this there is still the opening however this does not appear to have the same depth. Again there is no palpable bone. Oasis was replaced 10/28/2018; patient comes in with both wounds looking worse. The area over the first toe tip is now down to bone. The area over the TMA site is deeper and down to bone clearly with a increase in overall wound area. Equally concerning on the right TMA is the complete absence of a pulse this week which is a change. We are not even able to Doppler this. On the right he has a noncompressible ABI greater than 1.4 11/04/2018. Both wounds look somewhat worse. X-rays showed no osteomyelitis of the right foot but on the left there was underlying osteomyelitis in the left great toe distal phalanx. I been on the phone to Dr. Andree Elk surface at [facility] in [place] and they are arranging for another angiogram on the right on Monday. I have him on doxycycline for the osteomyelitis in the left great toe for  now. Infectious disease may be necessary 1/17; 2-week hiatus. Patient was admitted to hospital at [facility]. My understanding is he underwent an angioplasty of the right anterior tibial artery and had stents placed in the left anterior artery and the left tibial peroneal trunk. This was done by Dr. Andree Elk of interventional radiology. There is no major change in either 1 of the wounds. They have been using Aquacel Ag. As far as they are aware no imaging studies were done of the foot which is indeed unfortunate. I had him on doxycycline for 2 weeks since we identified the osteomyelitis in the left great toe by plain x-ray. I have renewed that again today. I am still suspicious about osteomyelitis in the amputation site and would consider doing another MRI to compare with the one done in September. The idea of hyperbaric oxygen certainly comes up for discussion 1/24; no major change in either wound area. I have him on doxycycline for osteomyelitis at the tip of the left great toe. As noted he has been previously and recently revascularized by Dr. Andree Elk at [place]. He is tolerating the doxycycline well. For some reason we do not have an infectious disease consult yet. Culture of drainage from the right foot site last week was negative 1/31; MRI of the right foot did not show osteomyelitis of the right ankle and foot. Notable for a skin ulceration overlying the second metatarsal stump with generalizing soft tissue edema of the ankle and foot consistent with cellulitis. Noted to have a partial-thickness tear of the Achilles tendon 7.5 cm proximal to the insertion Nothing really new in terms of symptoms. Patient's area on the tip of the left great toe is just about closed although he still has the probing area on the metatarsal amputation site. Appointment with Dr. Linus Salmons of infectious disease next week 2/7; Dr. Novella Olive did not feel that any further antibiotics were necessary he would follow-up in 2 months. The left  great toe appears to be closed still some surface callus that I gently looked under high did not see anything open or anything that was threatening to be open. He still has the open area on the mid part of his TMA site using endoform 2/14; left great toe is closed and he is completing his doxycycline as of last Sunday. He is not on any antibiotics. Unfortunately out of the right foot his wife noticed some subdermal hemorrhage this week. He had been walking 2 miles I had given him permission to do so this is not really a plantar wound. Using endoform to this wound but I changed to silver alginate this week 2/21; left great toe remains closed. Culture last week grew Streptococcus angiosis which I am not really familiar with however it is penicillin sensitive and I am going to put him on Augmentin. Not much change in the wound on the right foot the deep area is still probing precariously close to bone and the wound on the margin of the TMA is larger. 2/28; left great toe remains closed. He is completing the Augmentin I gave him last week. 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 This is being shown to Dr. Andree Elk at [place] to see if there is anything else that can be done here. He has been using silver alginate strips on the right 3/6; left great toe remains closed. He sees Dr. Andree Elk on Monday. The area on the plantar aspect of the right foot has the same small orifice with  thick callused tissue around this. However this time with removal of the callus tissue the wound is open all the way along the incision line to the end medially. We have been using silver alginate. 3/13; left toe remains closed although the area is callused. He sees Dr. Jacqualyn Posey of podiatry next week. The area on the plantar right foot looked a lot better this week. Culture I did of this was negative we use silver alginate. He is going next week for an attempt at revascularization by Dr.  Andree Elk 3/23; left toe remains closed although the area is callused. He will see Dr. Jacqualyn Posey in follow-up. The area on the right plantar foot continues to look surprisingly better over the last 3 visits. We have been using silver alginate. The revascularization he was supposed to have by Dr. Andree Elk at [facility] in [place] has been canceled [facility] procedure] 4/6; the right foot remains closed albeit callused. Podiatry canceled the appointment with regards to the left great toe. He has not seen Dr. Andree Elk at [facility] but thinks that Dr. Andree Elk has "done all he can do". He would be a candidate for the hemostaemix trial Readmission 07/29/2019 Mr. Stann Mainland is a man we know well from at least 3 previous stays in this clinic. He is a type II diabetic with severe PAD followed by Dr. Andree Elk at [facility] in [place]. 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 So far everything in both of these areas has remained closed. About 2 weeks ago he went to see his podiatrist at [facility] Dr. Babs Bertin. He had a thick callus on the left fifth metatarsal head that was shaved. He has developed an open wound in this area. They have been offloading this in his diabetic shoes. The patient has not had any more revascularizations by Dr. Andree Elk since the last time he was here. ABI in our clinic at the posterior tibial on the left was 1.06 Patient History Information obtained from Patient. Allergies daptomycin (Severity: Severe, Reaction: pneumonia) Family History Cancer - Mother, Heart Disease - Paternal Grandparents, Stroke - Maternal Grandparents, No family history of Diabetes, Hereditary Spherocytosis, Hypertension, Kidney Disease, Lung Disease, Seizures, Thyroid Problems, Tuberculosis. Social History Never smoker, Marital Status - Married, Alcohol Use - Rarely, Drug Use - No History,  Caffeine Use - Daily. Medical History Eyes Patient has history of Cataracts Denies history of Glaucoma, Optic Neuritis Ear/Nose/Mouth/Throat Patient has history of Chronic sinus problems/congestion - allergies Denies history of Middle ear problems Hematologic/Lymphatic Denies history of Anemia, Hemophilia, Human Immunodeficiency Virus, Lymphedema, Sickle Cell Disease Respiratory Denies history of Aspiration, Asthma, Chronic Obstructive Pulmonary Disease (COPD), Pneumothorax, Sleep Apnea, Tuberculosis Cardiovascular Patient has history of Coronary Artery Disease - 2 vessel bypass 2008, Hypertension, Peripheral Arterial Disease Denies history of Angina, Arrhythmia, Congestive Heart Failure, Deep Vein Thrombosis, Hypotension, Myocardial Infarction, Peripheral Venous Disease, Phlebitis, Vasculitis Gastrointestinal Denies history of Cirrhosis , Colitis, Crohnoos, Hepatitis A, Hepatitis B, Hepatitis C Endocrine Patient has history of Type II Diabetes Denies history of Type I Diabetes Genitourinary Denies history of End Stage Renal Disease Immunological Denies history of Lupus Erythematosus, Raynaudoos, Scleroderma Integumentary (Skin) Denies history of History of Burn Musculoskeletal Patient has history of Osteoarthritis Denies history of Gout, Rheumatoid Arthritis, Osteomyelitis Neurologic Patient has history of Neuropathy Denies history of Dementia, Quadriplegia, Paraplegia, Seizure Disorder Oncologic Denies history of  Received Chemotherapy, Received Radiation Psychiatric Denies history of Anorexia/bulimia, Confinement Anxiety Hospitalization/Surgery History - coronary artery bypass graft. - (R) transmet amputation. - arteriogram. - revascularization BLE. Medical And Surgical History Notes Genitourinary h/o kidney stones Review of Systems (ROS) Constitutional Symptoms (General Health) Denies complaints or symptoms of Fatigue, Fever, Chills, Marked Weight  Change. Eyes Complains or has symptoms of Glasses / Contacts. Ear/Nose/Mouth/Throat Denies complaints or symptoms of Chronic sinus problems or rhinitis. Respiratory Denies complaints or symptoms of Chronic or frequent coughs, Shortness of Breath. Cardiovascular Denies complaints or symptoms of Chest pain. Gastrointestinal Denies complaints or symptoms of Frequent diarrhea, Nausea, Vomiting. Endocrine Denies complaints or symptoms of Heat/cold intolerance. Genitourinary Denies complaints or symptoms of Frequent urination. Integumentary (Skin) Complains or has symptoms of Wounds - left foot. Musculoskeletal Denies complaints or symptoms of Muscle Pain, Muscle Weakness. Neurologic Complains or has symptoms of Numbness/parasthesias. Psychiatric Denies complaints or symptoms of Claustrophobia, Suicidal. Objective Constitutional Appears in no distress. Eyes Conjunctivae clear. No discharge.no icterus. Respiratory work of breathing is normal. Cardiovascular Faint left posterior tibial greater than right posterior tibial pulses palpable. I could not feel his dorsalis pedis pulse. Psychiatric appears at normal baseline. General Notes: Wound exam; the patient has a small area over the plantar left fifth metatarsal head. Undermining superiorly by 1 or 2 me millimeters. Using a #3 curette the skin over this area was removed. There was no bleeding. This does not probe to bone. No surrounding erythema is seen. Integumentary (Hair, Skin) No evidence of infection around the wound. Wound #5 status is Open. Original cause of wound was Gradually Appeared. The wound is located on the Left Metatarsal head fifth. The wound measures 0.2cm length x 0.4cm width x 0.2cm depth; 0.063cm^2 area and 0.013cm^3 volume. There is Fat Layer (Subcutaneous Tissue) Exposed exposed. There is no tunneling noted, however, there is undermining starting at 4:00 and ending at 8:00 with a maximum distance of 0.2cm.  There is a none present amount of drainage noted. The wound margin is thickened. There is large (67-100%) pink, pale granulation within the wound bed. There is no necrotic tissue within the wound bed. Assessment Active Problems ICD-10 Type 2 diabetes mellitus with foot ulcer Type 2 diabetes mellitus with diabetic peripheral angiopathy without gangrene Non-pressure chronic ulcer of other part of left foot limited to breakdown of skin Procedures Wound #5 Pre-procedure diagnosis of Wound #5 is a Diabetic Wound/Ulcer of the Lower Extremity located on the Left Metatarsal head fifth .Severity of Tissue Pre Debridement is: Fat layer exposed. There was a Selective/Open Wound Skin/Dermis Debridement with a total area of 0.08 sq cm performed by Ricard Dillon., MD. With the following instrument(s): Curette to remove Viable and Non-Viable tissue/material. Material removed includes Callus and Skin: Dermis and. No specimens were taken. A time out was conducted at 14:07, prior to the start of the procedure. A Minimum amount of bleeding was controlled with Pressure. The procedure was tolerated well with a pain level of 0 throughout and a pain level of 0 following the procedure. Post Debridement Measurements: 0.2cm length x 0.4cm width x 0.2cm depth; 0.013cm^3 volume. Character of Wound/Ulcer Post Debridement is improved. Severity of Tissue Post Debridement is: Fat layer exposed. Post procedure Diagnosis Wound #5: Same as Pre-Procedure Plan Follow-up Appointments: Return Appointment in 1 week. Dressing Change Frequency: Change dressing every day. Wound Cleansing: Clean wound with Wound Cleanser Primary Wound Dressing: Calcium Alginate with Silver Secondary Dressing: Kerlix/Rolled Gauze - secure with tape Dry Gauze Other: - felt  to cushion shoe Off-Loading: Other: - surgical shoe with felt 1. Silver alginate to the wound 2. kerlix and rolled gauze. His wife will change this daily 3. I have  asked him to keep off his foot is much as is practical. I will put him in a surgical shoe with felt offloading 4. If this wound stalls or regresses I will consider him for a [place]co forefoot off loader up to and including a total contact cast. Electronic Signature(s) Signed: 07/29/2019 2:30:13 PM By: [name] MD Entered By: [name] on 07/29/2019 14:30:13 -------------------------------------------------------------------------------- HxROS Details Patient Name: Date of Service: [name], [name] 07/29/2019 1:15 PM Medical Record [id number] Patient Account Number: [id number] Date of Birth/Sex: Treating RN: 17-Apr-1946 (73 y.o. [name] Primary Care Provider: [name] Other Clinician: Referring Provider: Treating Provider/Extender:[name], [name], [name] in Treatment: 0 Information Obtained From Patient Constitutional Symptoms (General Health) Complaints and Symptoms: Negative for: Fatigue; Fever; Chills; Marked Weight Change Eyes Complaints and Symptoms: Positive for: Glasses / Contacts Medical History: Positive for: Cataracts Negative for: Glaucoma; Optic Neuritis Ear/Nose/Mouth/Throat Complaints and Symptoms: Negative for: Chronic sinus problems or rhinitis Medical History: Positive for: Chronic sinus problems/congestion - allergies Negative for: Middle ear problems Respiratory Complaints and Symptoms: Negative for: Chronic or frequent coughs; Shortness of Breath Medical History: Negative for: Aspiration; Asthma; Chronic Obstructive Pulmonary Disease (COPD); Pneumothorax; Sleep Apnea; Tuberculosis Cardiovascular Complaints and Symptoms: Negative for: Chest pain Medical History: Positive for: Coronary Artery Disease - 2 vessel bypass 2008; Hypertension; Peripheral Arterial Disease Negative for: Angina; Arrhythmia; Congestive Heart Failure; Deep Vein Thrombosis; Hypotension; Myocardial Infarction; Peripheral Venous Disease;  Phlebitis; Vasculitis Gastrointestinal Complaints and Symptoms: Negative for: Frequent diarrhea; Nausea; Vomiting Medical History: Negative for: Cirrhosis ; Colitis; Crohns; Hepatitis A; Hepatitis B; Hepatitis C Endocrine Complaints and Symptoms: Negative for: Heat/cold intolerance Medical History: Positive for: Type II Diabetes Negative for: Type I Diabetes Time with diabetes: 1990 Treated with: Insulin, Oral agents Blood sugar tested every day: Yes Tested : 1-3 per day Blood sugar testing results: Breakfast: 148; Lunch: 83; Dinner: 109 Genitourinary Complaints and Symptoms: Negative for: Frequent urination Medical History: Negative for: End Stage Renal Disease Past Medical History Notes: h/o kidney stones Integumentary (Skin) Complaints and Symptoms: Positive for: Wounds - left foot Medical History: Negative for: History of Burn Musculoskeletal Complaints and Symptoms: Negative for: Muscle Pain; Muscle Weakness Medical History: Positive for: Osteoarthritis Negative for: Gout; Rheumatoid Arthritis; Osteomyelitis Neurologic Complaints and Symptoms: Positive for: Numbness/parasthesias Medical History: Positive for: Neuropathy Negative for: Dementia; Quadriplegia; Paraplegia; Seizure Disorder Psychiatric Complaints and Symptoms: Negative for: Claustrophobia; Suicidal Medical History: Negative for: Anorexia/bulimia; Confinement Anxiety Hematologic/Lymphatic Medical History: Negative for: Anemia; Hemophilia; Human Immunodeficiency Virus; Lymphedema; Sickle Cell Disease Immunological Medical History: Negative for: Lupus Erythematosus; Raynauds; Scleroderma Oncologic Medical History: Negative for: Received Chemotherapy; Received Radiation HBO Extended History Items Ear/Nose/Mouth/Throat: Eyes: Chronic sinus Cataracts problems/congestion Immunizations Pneumococcal Vaccine: Received Pneumococcal Vaccination: No Implantable Devices Yes Hospitalization /  Surgery History Type of Hospitalization/Surgery coronary artery bypass graft (R) transmet amputation arteriogram revascularization BLE Family and Social History Cancer: Yes - Mother; Diabetes: No; Heart Disease: Yes - Paternal Grandparents; Hereditary Spherocytosis: No; Hypertension: No; Kidney Disease: No; Lung Disease: No; Seizures: No; Stroke: Yes - Maternal Grandparents; Thyroid Problems: No; Tuberculosis: No; Never smoker; Marital Status - Married; Alcohol Use: Rarely; Drug Use: No History; Caffeine Use: Daily; Financial Concerns: No; Food, Clothing or Shelter Needs: No; Support System Lacking: No; Transportation Concerns: No Engineer, maintenance) Signed: 07/29/2019 5:36:16 PM By: Baruch Gouty RN,  BSN Signed: 07/29/2019 5:43:25 PM By: [name] MD Entered By: Baruch Gouty on 07/29/2019 13:30:29 -------------------------------------------------------------------------------- SuperBill Details Patient Name: Date of Service: [name], [name] 07/29/2019 Medical Record [id number] Patient Account Number: [id number] Date of Birth/Sex: Treating RN: 01-29-1946 (72 y.o. [name]) [name] Primary Care Provider: [name] Other Clinician: Referring Provider: Treating Provider/Extender:[name], [name], [name] in Treatment: 0 Diagnosis Coding ICD-10 Codes Code Description E11.621 Type 2 diabetes mellitus with foot ulcer E11.51 Type 2 diabetes mellitus with diabetic peripheral angiopathy without gangrene L97.521 Non-pressure chronic ulcer of other part of left foot limited to breakdown of skin Facility Procedures CPT4 Code Description: [id number] - WOUND CARE VISIT-LEV 3 EST PT Modifier: 25 Quantity: 1 CPT4 Code Description: [id number] - DEBRIDE WOUND 1ST 20 SQ CM OR < ICD-10 Diagnosis Description L97.521 Non-pressure chronic ulcer of other part of left foot limited E11.621 Type 2 diabetes mellitus with foot ulcer Modifier: to breakdown Quantity: 1  of skin Physician Procedures CPT4 Code Description: [id number] 99213 - WC PHYS LEVEL 3 - EST PT ICD-10 Diagnosis Description E11.621 Type 2 diabetes mellitus with foot ulcer E11.51 Type 2 diabetes mellitus with diabetic peripheral angiopat L97.521 Non-pressure chronic ulcer of other  part of left foot limi Modifier: 25 hy without gan ted to breakdo Quantity: 1 grene wn of skin CPT4 Code Description: [id number] - WC PHYS DEBR WO ANESTH 20 SQ CM ICD-10 Diagnosis Description L97.521 Non-pressure chronic ulcer of other part of left foot limited E11.621 Type 2 diabetes mellitus with foot ulcer Modifier: to breakdown o Quantity: 1 f skin Electronic Signature(s) Signed: 07/29/2019 5:43:25 PM By: [name] MD Signed: 10/07/2019 2:58:33 PM By: [name] RN Entered By: [name] on 07/29/2019 15:10:16

## 2019-10-07 NOTE — Progress Notes (Signed)
[name], [name] ([id number]) Visit Report for 08/18/2019 Arrival Information Details Patient Name: Date of Service: [name], [name] 08/18/2019 11:30 [name] Medical Record [id number] Patient Account Number: [id number] Date of Birth/Sex: Treating RN: Aug 29, 1946 (72 y.o. [name]) [name] Primary Care Doreena Maulden: [name] Other Clinician: Referring Navraj Dreibelbis: Treating Momina Hunton/Extender:[name], [name], [name] in Treatment: 2 Visit Information History Since Last Visit All ordered tests and consults were completed: No Patient Arrived: Ambulatory Added or deleted any medications: No Arrival Time: 11:54 Any new allergies or adverse reactions: No Accompanied By: wife Had a fall or experienced change in No Transfer Assistance: None activities of daily living that may affect Patient Identification Verified: Yes risk of falls: Secondary Verification Process Completed: Yes Signs or symptoms of abuse/neglect since last No Patient Requires Transmission-Based No visito Precautions: Hospitalized since last visit: No Patient Has Alerts: No Implantable device outside of the clinic excluding No cellular tissue based products placed in the center since last visit: Has Dressing in Place as Prescribed: Yes Pain Present Now: Yes Electronic Signature(s) Signed: 10/07/2019 3:01:15 PM By: [name] RN Entered By: [name] on 08/18/2019 11:55:04 -------------------------------------------------------------------------------- Lower Extremity Assessment Details Patient Name: Date of Service: [name], [name] 08/18/2019 11:30 [name] Medical Record [id number] Patient Account Number: [id number] Date of Birth/Sex: Treating RN: 1946-02-06 (72 y.o. [name]) [name] Primary Care Josefa [place]: [name] Other Clinician: Referring Crystall Donaldson: Treating Nyeli Holtmeyer/Extender:[name], [name], [name] in Treatment: 2 Edema Assessment Assessed: [Left: No] [Right: No] Edema: [Left: N]  [Right: o] Calf Left: Right: Point of Measurement: cm From Medial Instep 39 cm cm Ankle Left: Right: Point of Measurement: cm From Medial Instep 26 cm cm Electronic Signature(s) Signed: 10/07/2019 3:01:15 PM By: [name] RN Entered By: [name] on 08/18/2019 12:06:48 -------------------------------------------------------------------------------- Multi Wound Chart Details Patient Name: Date of Service: [name], [name] 08/18/2019 11:30 [name] Medical Record [id number] Patient Account Number: [id number] Date of Birth/Sex: Treating RN: Sep 22, 1946 (73 y.o. 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] Comorbid History: [5:Cataracts, Chronic sinus N/A problems/congestion, Coronary Artery Disease, Hypertension, Peripheral Arterial Disease, Type II Diabetes, Osteoarthritis, Neuropathy] Date Acquired: [5:07/14/2019] [N/A:N/A] Weeks of Treatment: [5:2] [N/A:N/A] Wound Status: [5:Open] [N/A:N/A] Measurements L x W x D 1x1.5x0.1 [N/A:N/A] (cm) Area (cm) : [5:1.178] [N/A:N/A] Volume (cm) : [5:0.118] [N/A:N/A] % Reduction in Area: [5:-1769.80%] [N/A:N/A] % Reduction in Volume: [5:-807.70%] [N/A:N/A] Classification: [5:Grade 1] [N/A:N/A] Exudate Amount: [5:Small] [N/A:N/A] Exudate Type: [5:Serosanguineous] [N/A:N/A] Exudate Color: [5:red, brown] [N/A:N/A] Wound Margin: [5:Thickened] [N/A:N/A] Granulation Amount: [5:Large (67-100%)] [N/A:N/A] Granulation Quality: [5:Red, Pink]  [N/A:N/A] Necrotic Amount: [5:None Present (0%)] [N/A:N/A] Exposed Structures: [5:Fat Layer (Subcutaneous Tissue) Exposed: Yes Fascia: No Tendon: No Muscle: No Joint: No Bone: No] [N/A:N/A] Epithelialization: [5:Small (1-33%)] [N/A:N/A] Debridement: [5:Debridement - Excisional] [N/A:N/A] Pre-procedure [5:12:42] [N/A:N/A] Verification/Time Out Taken: Tissue Debrided: [5:Callus, Subcutaneous] [N/A:N/A] Level: [5:Skin/Subcutaneous Tissue] [N/A:N/A] Debridement Area (sq cm):1.5 [N/A:N/A] Instrument: [5:Curette] [N/A:N/A] Bleeding: [5:Minimum] [N/A:N/A] Hemostasis Achieved: [5:Pressure] [N/A:N/A] Procedural Pain: [5:0] [N/A:N/A] Post Procedural Pain: [5:0] [N/A:N/A] Debridement Treatment Procedure was tolerated [N/A:N/A] Response: [5:well] Post Debridement [5:1x1.5x0.1] [N/A:N/A] Measurements L x W x D (cm) Post Debridement [5:0.118] [N/A:N/A] Volume: (cm) Procedures Performed: Debridement [5:Total Contact Cast] [N/A:N/A] Treatment Notes Electronic Signature(s) Signed: 08/18/2019 5:54:03 PM  By: Baltazar Najjar MD Signed: 08/20/2019 6:50:57 PM By: Zandra Abts RN, BSN Entered By: Baltazar Najjar on 08/18/2019 13:15:46 -------------------------------------------------------------------------------- Multi-Disciplinary Care Plan Details Patient Name: Date of Service: [name], [name] 08/18/2019 11:30 [name] Medical Record [id number] Patient Account Number: [id number] Date of Birth/Sex: Treating RN: 1945-11-16 (73 y.o. [name] Primary Care Naseem Varden: [name] Other Clinician: Referring Jamiesha Victoria: Treating Lisvet Rasheed/Extender:[name], [name], [name] in Treatment: 2 Active Inactive Wound/Skin Impairment Nursing Diagnoses: Knowledge deficit related to ulceration/compromised skin integrity Goals: Patient/caregiver will verbalize understanding of skin care regimen Date Initiated: 07/29/2019 Target Resolution Date: 09/05/2019 Goal Status: Active Ulcer/skin  breakdown will have a volume reduction of 30% by week 4 Date Initiated: 07/29/2019 Target Resolution Date: 09/05/2019 Goal Status: Active Interventions: Assess patient/caregiver ability to obtain necessary supplies Assess patient/caregiver ability to perform ulcer/skin care regimen upon admission and as needed Assess ulceration(s) every visit Notes: Electronic Signature(s) Signed: 08/20/2019 6:50:57 PM By: Zandra Abts RN, BSN Entered By: Zandra Abts on 08/18/2019 12:24:37 -------------------------------------------------------------------------------- Pain Assessment Details Patient Name: Date of Service: [name], [name] 08/18/2019 11:30 [name] Medical Record [id number] Patient Account Number: [id number] Date of Birth/Sex: Treating RN: August 01, 1946 (72 y.o. [name] Primary Care Rayetta Veith: [name] Other Clinician: Referring Skylee Baird: Treating Pablo Stauffer/Extender:[name], [name], [name] in Treatment: 2 Active Problems Location of Pain Severity and Description of Pain Patient Has Paino Yes Site Locations Pain Location: Pain in Ulcers With Dressing Change: Yes Duration of the Pain. Constant / Intermittento Intermittent How Long Does it Lasto Hours: Minutes: 15 Rate the pain. Current Pain Level: 0 Worst Pain Level: 3 Least Pain Level: 0 Tolerable Pain Level: 5 Character of Pain Describe the Pain: Aching Pain Management and Medication Current Pain Management: Medication: Yes Cold Application: No Rest: Yes Massage: No Activity: No T.E.N.S.: No Heat Application: No Leg drop or elevation: No Is the Current Pain Management Adequate: Inadequate How does your wound impact your activities of daily livingo Sleep: No Bathing: No Appetite: No Relationship With Others: No Bladder Continence: No Emotions: No Bowel Continence: No Work: No Toileting: No Drive: No Dressing: No Hobbies: No Electronic Signature(s) Signed: 10/07/2019 3:01:15 PM By:  [name] RN Entered By: [name] on 08/18/2019 11:55:59 -------------------------------------------------------------------------------- Patient/Caregiver Education Details Patient Name: [name] 10/19/2020andnbsp11:30 Date of Service: [name] Medical Record [id number] Number: Patient Account Number: [id number] Treating RN: Date of Birth/Gender: 1946-06-12 (73 y.o. [name]) Other Clinician: Primary Care Physician:Howard, Rob Hickman Referring Physician: Physician/Extender: Joaquin Bend in Treatment: 2 Education Assessment Education Provided To: Patient Education Topics Provided Wound/Skin Impairment: Methods: Explain/Verbal Responses: State content correctly Electronic Signature(s) Signed: 08/20/2019 6:50:57 PM By: Zandra Abts RN, BSN Entered By: Zandra Abts on 08/18/2019 12:24:46 -------------------------------------------------------------------------------- Wound Assessment Details Patient Name: Date of Service: [name], [name] 08/18/2019 11:30 [name] Medical Record [id number] Patient Account Number: [id number] Date of Birth/Sex: Treating RN: January 30, 1946 (72 y.o. [name] [name] Primary Care Viola Placeres: [name] Other Clinician: Referring Yaira Bernardi: Treating Jacquette Canales/Extender:[name], [name], [name] in Treatment: 2 Wound Status Wound Number: 5 Primary Diabetic Wound/Ulcer of the Lower Extremity Etiology: Wound Location: Left Metatarsal head fifth Wound Open Wounding Event: Gradually Appeared Status: Date Acquired: 07/14/2019 Comorbid Cataracts, Chronic sinus problems/congestion, Weeks Of Treatment: 2 History: Coronary Artery Disease, Hypertension, Clustered Wound: No Peripheral Arterial Disease, Type II Diabetes, Osteoarthritis, Neuropathy Photos Wound Measurements Length: (cm) 1 Width: (cm) 1.5 Depth: (cm) 0.1 Area: (cm) 1.178 Volume: (cm) 0.118 Wound Description Classification: Grade  1 Wound Margin: Thickened Exudate Amount: Small Exudate Type: Serosanguineous  Exudate Color: red, brown Wound Bed Granulation Amount: Large (67-100%) Granulation Quality: Red, Pink Necrotic Amount: None Present (0%) Foul Odor After Cleansing: No Slough/Fibrino No Exposed Structure Fascia Exposed: No Fat Layer (Subcutaneous Tissue) Exposed: Ye Tendon Exposed: No Muscle Exposed: No Joint Exposed: No Bone Exposed: No % Reduction in Area: -1769.8% % Reduction in Volume: -807.7% Epithelialization: Small (1-33%) Tunneling: No Undermining: No s Electronic Signature(s) Signed: 08/21/2019 4:25:07 PM By: Benjaman KindlerJones, Dedrick EMT/HBOT Signed: 10/07/2019 3:01:15 PM By: Yevonne PaxEpps, Carrie RN Entered By: Benjaman KindlerJones, Dedrick on 08/20/2019 08:45:31 -------------------------------------------------------------------------------- Vitals Details Patient Name: Date of Service: Todd LootsRODGERS, [name] 08/18/2019 11:30 [name] Medical Record [id number] Patient Account Number: [id number] Date of Birth/Sex: Treating RN: 10-22-1946 (72 y.o. Melonie FloridaM) Epps, Carrie Primary Care Ilai Hiller: Selinda FlavinHoward, Kevin Other Clinician: Referring Ellington Greenslade: Treating Hammond Obeirne/Extender:[name], Peter CongoMichael Howard, Wadie LessenKevin Weeks in Treatment: 2 Vital Signs Time Taken: 11:55 Temperature (F): 98.7 Pulse (bpm): 81 Respiratory Rate (breaths/min): 18 Blood Pressure (mmHg): 117/68 Reference Range: 80 - 120 mg / dl Electronic Signature(s) Signed: 10/07/2019 3:01:15 PM By: Yevonne PaxEpps, Carrie RN Entered By: Yevonne PaxEpps, Carrie on 08/18/2019 11:55:31

## 2019-10-08 NOTE — Progress Notes (Signed)
[name], [name] ([id number]) Visit Report for 10/07/2019 HPI Details Patient Name: Date of Service: ABAD, MANARD 10/07/2019 2:30 PM Medical Record [id number] Patient Account Number: [id number] Date of Birth/Sex: Treating RN: Sep 02, 1946 (73 y.o. M) Primary Care Provider: [name] Other Clinician: Referring Provider: Treating Provider/Extender:Nochum Fenter, [name], [name] in Treatment: 10 History of Present Illness HPI Description: 05/18/16; this is a 73year-old diabetic who is a type II diabetic on insulin. The history is that he traumatized his right foot developed a sore sometime in late March. Shortly thereafter he went on a cruise but he had to get off the cruise ship in [place] and fly urgently back to [place] where he was admitted to [facility] and ultimately underwent a transmetatarsal amputation by Dr. 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 According to the patient and his wife this wound never really healed. He was seen on 2 occasions in the wound care center in [place] and had vascular studies and then was referred urgently to Dr. Bridgett Larsson of vascular surgery. He underwent an angiogram on 05/10/16. Unfortunately nothing really could be done to improve his vascular status. He had a 75-90% stenosis in the midsegment of 1 segment of the posterior femoral artery. He had a patent popliteal, his anterior tibial occluded shortly after takeoff. Perineal had a greater than 90% stenosis posterior tibial is occluded feet had no distal collaterals feed distal aspect of the transmetatarsal amputation site. The patient tells me that he had a prolonged period of Santyl by Dr. Doran Durand was some initial improvement but then this was stopped. I think they're only applying daily dressings/dry dressings. He has not had a recent x-ray of the right foot he did have one before his surgery in April. His wife by the dimensions of the wound/surgical site being followed at home since  4/20. At that point the dimensions were 0.5 x 12 x 0.2 on 7/19 this was 1.8 x 6 x 0.4. He is not currently on any antibiotics. His hemoglobin A1c in early April was 12.9 at that point he was started on insulin. Apparently his blood sugars are much lower he has an appointment with Dr. Legrand Como Alteimer of endocrine next week. 05/29/16 x-ray of the area did not show osteomyelitis. I think he probably needs an MRI at this point. His wife is asking about something called"Yireh" cream which is not FDA approved. I have not heard of this. 06/22/16; MRI did not really suggest osteomyelitis. There was minimal marrow edema and enhancement in the stump of the second metatarsal felt to be secondary likely to postoperative change rather than osteomyelitis. The patient has arranged his own consultation with Dr. Andree Elk at [facility], apparently their daughter lives in [place] and has some connection here. Any improvement in vascular supply by Dr. Andree Elk would of course be helpful. Dr. Bridgett Larsson did not feel that anything further could be done other than amputation if wound care did not result in healing or if the area deteriorates. 06/26/16; the patient has been to see Dr. Andree Elk at [place] and had an angiogram. He is going for a procedure on Thursday which will involve catheterization. I'm not sure if this is an anterograde or retrograde approach. He has been using Santyl to the wound 07/10/16; the patient had a repeat angiogram and angioplasty at [facility] by Dr. Brunetta Jeans. His angiogram showed right CFA and profundal widely patent. The right as of a.m. popliteal artery were widely patent the right anterior tibial was occluded proximally and reconstitutes at the ankle.  Peroneal artery was patent to the foot. Posterior tibial artery was occluded. The patient had angioplasty of the anterior tibial artery.. This was quite successful. He was recommended for Plavix as well as aspirin. 07/17/16; the patient was close to be a nurse visit today  however the outer dressing of the Apligraf fell off. Noted drainage. I was asked to see the wound. The patient is noted an odor however his wife had noted that. Drainage with Apligraf not necessarily a bad thing. He has not been systemically unwell 07/24/16; we are still have an issue with drainage of this wound. In spite of this I applied his second Apligraf. Medially the area still is probing to bone. 08/07/16; Apligraf reapplied in general wound looks improved. 08/21/16 Apligraf #4. Wound looks much better 09/04/16 patientt's wound again today continues to appear to improve with the application of the Apligraf's. He notes no increased discomfort or concerns at this point in time. 09/18/16; the patient returns today 2 weeks after his fifth application of Apligraf. Predictably three quarters of the width of this wound has healed. The deep area that probe to bone medially is still open. The patient asked how much out-of-pocket dollars would be for additional Apligraf's. 09/25/16; now using Hydrofera Blue. He has completed 5 Apligraf applications with considerable improvement in this deep open transmetatarsal amputation site. His wound is now a triangular-shaped wound on the medial aspect. At roughly 12 to 2:00 this probes another centimeter but as opposed to in the past this does not probe to bone. The patient has been seen at [facility] by Dr. Zenia Resides. He is not planning to do any more revascularization unless the wound stalls or worsens per the patient 10/02/16; 0.7 x 0.8 x 0.8. Unfortunately although the wound looks stable to improved. There is now easily probable bone. This hasn't been present for several weeks. Patient is not otherwise symptomatic he is not experiencing any pain. I did a culture of the wound bed 10/09/16. Deterioration last week. Culture grew MRSA and although there is improvement here with doxycycline prescribed over the phone I'm going to try to get him linezolid 600 twice a day for  10 days today. 10/16/16; he is completing a weeks worth of linezolid and still has 3 more days to go. Small triangular-shaped open area with some degree of undermining. There is still palpable bone with a curet. Overall the area appears better than last week 10/20/16 he has completed the linezolid still having some nausea and vomiting but no diarrhea. He has exposed bone this week which is a deterioration. 10/27/16 patient now has a small but probing wound down to bone. Culture of this bone that I did last week showed a few methicillin-resistant staph aureus. I have little doubt that this represents acute/subacute osteomyelitis. The patient is currently on Doxy which I will continue he also completed 10 days of linezolid. We are now in a difficult situation with this patient's foot after considerable discussion we will send him back to see Dr. Doran Durand for a surgical opinion of this I'm also going to try to arrange a infectious disease consult at [facility] hopefully week and get this prior to her usual 4-6 weeks we having [place]. The patient clearly is going to need 6 weeks of IV vancomycin. If we cannot arrange this expediently I'll have to consider ordering this myself through a home infusion company 11/03/16; the patient now has a small in terms of circumference but probing wound. No bone palpable today. The patient remains  on doxycycline 100 twice a day which should support him until he sees infectious disease at [facility] next week the following week on Wednesday I believe he has an appointment with Dr. Andree Elk at [facility] who is his vascular cardiologist. Finally he has an appointment with Dr. Doran Durand on 11/22/16 we have been using silver alginate. The patient's wife states they are having trouble getting this through [facility] 11/13/16; the patient was seen by infectious disease at [facility] in the 11th PICC line placed in preparation for IV antibiotics. A tummy he has not going to get IV vancomycin o  Ceftaroline. They've also ordered an MRI. Patient has a follow-up with Dr. Doran Durand on 11/22/16 and Dr. Andree Elk at [facility] tomorrow 11/23/16 the patient is on daptomycin as directed by infectious disease at [facility]. He is also been back to see Dr. Andree Elk at [facility]. He underwent a repeat arteriogram. He had a successful PTA of the right anterior tibial artery. He is on dual antiplatelete treatment with Plavix and aspirin. Finally he had the MRI of his foot in [place]. This showed cellulitis about the foot worse distally edema and enhancement in the reminiscent of the second metatarsal was consistent with osteomyelitis therefore what I was assuming to be the first metatarsal may be actually the second. He also has a fluid collection deep to the calcaneus at the level of the calcaneal spur which could be an abscess or due to adventitial bursitis. He had a small tear in his Achilles 11/30/16; the patient continues on daptomycin as directed by infectious disease at [facility]. He is been revascularized by Dr. Andree Elk at [facility] in [place]. He has been to see Gretta Arab who was the orthopedic surgeon who did his original amputation. I have not seen his not however per the patient's wife he did not offer another surgical local surgical prodecure to remove involved bone. He verbalized his usual disbelief in not just hyperbarics but any medical therapy for this condition(osteomyelitis). I discussed this in detail with the patient today including answering the question about a BKA definitively "curing" the current condition. 12/07/16; the patient continues on daptomycin as directed by infectious disease at [facility]. This is directed at the MRSA that we cultured from his bone debridement from 12/22. Lab work today shows a white count of 8.7 hemoglobin of 10.5 which is microcytic and hypochromic differential count shows a slightly elevated monocyte count at 1.2 eosinophilic count of 0.6. His creatinine is  1.11 sedimentation rate apparently is gone from 35-34 now 40. I explained was wife I don't think this represents a trend. His total CK is 48 12/14/16- patient is here for follow-up evaluation of his right TMA site. He continues to receive IV daptomycin per infectious disease. His serum inflammatory markers remain elevated. He complains of intermittent pain to the medial aspect of the TMA site with intermittent erythema. He voices no complaints or concerns regarding hyperbaric therapy. Overall he and his wife are expressing a frustration and discouragement regarrding the length of time of treatment. 12/21/16; small open wound at roughly the first or second metatarsal metatarsalphalyngeal joint reminiscence of his transmetatarsal amputation site he continues to receive IV daptomycin per infectious disease at [facility]. He will finish these a week tomorrow. He has lab work which I been copied on. His white count is 10.8 hemoglobin 8.9 MCV is low at 75., MCH low at 24.5 platelet count slightly elevated at 626. Differential count shows 70% neutrophils 10% monocytes and 10% eosinophils. His comprehensive metabolic  panel shows a slightly low sodium at 133 albumin low at 3.1 total CK is normal at 42 sedimentation rate is much higher at 82. He has had iron studies that show a serum iron of 15 and iron binding capacity of 238 and iron saturation of 6. This is suggestive of iron deficiency. B12 and folate were normal ferritin at 113 The patient tells me that he is not eating well and he has lost weight. He feels episodically nauseated. He is coughing and gagging on mucus which she thinks is sinusitis. He has an appointment with his primary doctor at 5:00 this afternoon in [facility] 01/02/17; the patient developed a subacute pneumonitis. He was admitted to [facility] after a CT scan showed an extensive interstitial pneumonitis [I have not yet seen this]. 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 He has since been discharged. He is not on oxygen. He feels fatigued and very short of breath with exertion. At [facility] the wound care nurse there felt that his wound was healed. He did complete his daptomycin and has follow-up with infectious disease on Friday. X-rays I did before he went to [facility] still suggested residual osteomyelitis in the anterior aspect of the second must metatarsal head. I'm not sure I would've expected any different. There was no other findings. I actually think I did this because of erythema over the first metatarsal head reminiscent 01/11/17; the patient has eosinophilic pneumonitis. He has been reviewed by pulmonology and given clearance for hyperbarics at least that's what his wife says. I'll need to see if there is note in care everywhere. Apparently the prognosis for improvement of daptomycin induced eosinophilic granulocyte is is 3 months without steroids. In the meantime infectious disease has placed him on doxycycline until the wound is closed. He is still is lost a lot of weight and his blood sugars are running in the mid 60s to low 80s fasting and at all times during the day 01/18/17; he has had adjustments in his insulin apparently his blood sugars in the morning or over 100. He wants to restart his hyperbaric treatment we'll do this at 1:00. 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 I think there is good reason to complete his treatments in order to give him the best chance of maintaining a healed status and these DFU 3 wounds with MRSA infection in the bone 02/15/17; the patient was seen today in conjunction with HBO. He completed hyperbaric oxygen today. The open area on his transmetatarsal site has remained closed. There was an area of erythema when I saw him earlier in  the week on the posterior heel although that is resolved as of today as well. He has been using a cam walker. This is a patient who came to [place] after a transmetatarsal amputation that was necrotic and dehisced. He required revascularization percutaneously on 2 different occasions by Dr. Andree Elk of invasive cardiology at [facility]. 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 He went to [facility] infectious disease and had IV daptomycin for 5 weeks before developing eosinophilic pneumonitis and requiring an admission to hospital/ICU. He made a good recovery and is continued on doxycycline since. As mentioned his foot is closed now. He has a follow-up with Dr. Andree Elk tomorrow. He is going to [employer]  on Monday for a custom-made shoe READMISSION Last visit Dr. Andree Elk [facility] 02/16/17 1. Critical limb ischemia of the RLE: s/p transmetatarsal amputation of the RLE, now completely healed. s/p right ATA percutaneous revascularization procedure x2, most recently 11/20/2016 s/p PTA of right AT 100% to less than 20% with a 2.5 x 200 balloon. He does have some residual osteomyelitis, but given the wound is closed, the orthopedist has recommended to follow. He has a fitting for a special shoe coming up next week. He has completed a course of daptomycin and doxycycline and he has been discharged by ID. He has completed a course of hyperbaric therapy. Continue Continue medical management with aspirin, Plavix and statin therapy. We will discuss ongoing Plavix therapy at follow-up in 6 months. 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 Given no symptoms, we will conservatively manage. He doesn't want to do any more invasive studies at this time, which is reasonable. The patient arrives today  out of 2 concerns both on the right transmetatarsal site. 1 at the level of the reminiscent fifth metatarsal head and the other at roughly the first or second. Both of these look like dark subcutaneous discoloration probably subdermal bleeding. He is recently obtained new adaptive footwear for the right foot. He also has a callus on the left fifth dorsal toe however he follows with podiatry for this and I don't think this is any issue. ABIs in this clinic today were 0.66 on the right and 1.06 on the left. On entrance into our clinic initially this was 0.95 and 0.92. He does not describe current claudication. They're going away on a cruise in 8 weeks and I think are trying to do the month is much as they can proactively. They follow with podiatry and have an appointment with Dr. Andree Elk in October READMISSION 06/25/18 This is a patient that we have not seen in almost a year. He is a type II diabetic with known PAD. He is followed by Dr. Andree Elk of interventional cardiology at [facility] in [place]. He is required revascularization for significant PAD. When we first saw him he required a transmetatarsal amputation. He had underlying osteomyelitis with a nonhealing surgical wound. This eventually closed with wound care, IV antibiotics and hyperbaric oxygen. They tell me that he has a modified shoe and he is very active walking up to 4 miles a day. He is followed by Dr. Geroge Baseman of podiatry. His wife states that he underwent a removal of callus over this site on July 9. She felt there may be drainage from this site after that although she could never really determined and there was callus buildup again. On 06/01/18 there was pressure bleeding through the overlying callus. he was given a prescription for 7 days of Bactrim. Fortuitously he has an appointment with Dr. Andree Elk on 06/04/18 and he immediately underwent revascularization of the right leg although I have not had a chance to review these records  in care everywhere. This was apparently done through anterior and retrograde access. On 06/06/18 he had another debridement by Dr. Bernette Mayers. Vitamin soaking with Epsom salts for 20 minutes and applying calcium alginate. The original trans-met was on April 2017 I believe by Dr. Doran Durand. His ABI in our clinic was noncompressible today. 07/02/18; x-ray I ordered last week was negative for osteomyelitis. Swab culture was also negative. He is going to require an  MRI which I have ordered today. 07/09/18; surprisingly the MRI of the foot that I ordered did not show osteomyelitis. He did suggest the possibility of cellulitis. For this reason I'll go ahead and give him a 10 day course of doxycycline. Although the previous culture of this area was negative 07/16/18; it arrives with the wound looking much the same. Roughly the same depth. He has thick subcutaneous tissue around the wound orifice but this still has roughly the same depth. Been using silver alginate. We applied Oasis #1 today 07/23/2018; still having to remove a lot of callus and thick subcutaneous tissue to actually define the wound here. Most of this seems to have closed down yet he has a comma shaped divot over the top of the area that still I think is open. We applied Oasis #2 His wife expressed concern about the tip of his left great toe. This almost looks like a small blister. She also showed it today to her podiatrist Dr. Geroge Baseman who did not think this was anything serious. I am not sure is anything serious either however I think it bears some watching. He is not in any pain however he is insensate 07/30/2018;; still on a lot of nonviable tissue over the surface of the wound however cleaning this up reveals a more substantial wound orifice but less of a probing wound depth. This is not probed to bone. There is no evidence of infection The wife is still concerned about a non-open area on the tip of his left great toe. Almost feels like a bony  outgrowth. She had previously showed this to podiatry. I do not think this is a blister. A friction area would be possible although he is really not walking according to his wife. He had a small skin tag on his right buttock but no open wound here either 08/06/2018; we applied a third Oasis last week. Unfortunately there is really no improvement. Still requiring extensive debridement to expose the wound bed from a horizontal slitlike depression. I still have not been able to get this to fill in properly. On the positive side there is now no probable bone from when he first came into the facility. I changed him to silver alginate today after a reasonably aggressive debridement 08/13/2018; once again the patient comes in with skin and subcutaneous tissue closing over the small probing area with the underlying cavity of the wound on the right TMA site. I applied silver alginate to this last week. Prior to that we used Oasis x3 still not able to get this area granulating. He does not have a probing area of the bone which is an improvement from when I spur started working on this and an MRI did not suggest osteomyelitis. I do not see evidence of infection here but I am increasingly concerned about why I cannot get this area to granulate. Each time I debrided this is looks like this is simply a matter of getting granulation to fill in the hole and then getting epithelialization. This does not seem to happen Also I sent him back to see podiatry Dr. Earleen Newport about the what felt to be bony outgrowth on the tip of his left great toe. Apparently after heel he left the clinic last week or the next day he developed a blood blister. He did see Dr. Earleen Newport. He went on to have a debridement of the medial nail cuticle he now has an open area here as well as some denuded skin. They did an  x-ray apparently does have a bony outgrowth or spur but I am not able to look at this. They are using topical antibiotics apparently  there was some suggested he use Santyl. Patient's wife was anxious for my opinion of this 08/20/2018; we are able to keep the wound open this time instead of the thick subcutaneous tissue closing over the top of it however unfortunately once again this probes to bone. I do not see any evidence of infection and previous MRI did not show osteomyelitis. I elected to go back to the Oasis to see if we can stimulate some granulation. Last saw his vascular interventional cardiologist Dr. Andree Elk at the beginning of August and he had a repeat procedure. Nevertheless I wonder how much blood flow he has down to this area With regards to the left first toe he is seeing Dr. Earleen Newport next week. They are applying Bactroban to this area. 08/27/2018 Once again he comes in with thick eschar and subcutaneous tissue over the top of the small probing hole. This does not appear to go down to bone but it still has roughly the same depth. I reapplied Oasis today Over the left great toe there appears to be more of the wound at the tip of his toe than there was last week. This has an eschar on the surface of it. Will change to Santyl. There is seeing podiatry this afternoon 09/03/2018 He comes in today with the area on the transmetatarsal's site with a fair amount of callus, nonviable tissue over the circumference but it was not closed. I removed all of this as well as some subcutaneous debris and reapplied Oasis. There is no exposed bone The area over the tip of the left great toe started off as a nodule of uncertain etiology. He has been followed with podiatry. They have been removing part of the medial nail bed. He has nonviable tissue over the wound he been using Santyl in this area 09/10/18 Unfortunately comes in with neither wound area looking improved. The transmetatarsal amputation site once Again has nonviable debris over the surface requiring debridement. Unfortunately underneath this there is nothing that looks  viable and this once again goes right down to bone. There is no purulent drainage and no erythema. Also the surgical wound from podiatry on the left first toe has an ischemic-looking eschar over the surface of the tip of the toe I have elected not to attempt candidly debride this His wife as arranged for him to have follow-up noninvasive studies in [place] under the care of Dr. Andree Elk clinic. The question is he has known severe PAD. They had recently seen him in August. He has had revascularizations in both legs within the last 4 or 5 months. He is not complaining of pain and I cannot really get a history of claudication. He has not systemically unwell 09/20/2018 the patient has been to [facility] and been revascularized by Dr. Andree Elk earlier this week. Apparently he was able to open up the anterior tibial artery although I have not actually seen his formal report. He is going for an attempt to revascularize on the left on Monday. He is apparently working with an investigational stent for lower extremity arteries below the knee and he has talked to the patient about placing that on Monday if possible. The patient has been using silver alginate on the transmetatarsal amputation site and Santyl on the left 09/30/2018; patient had his revascularization on the left this apparently included a standard approach as well  as a more distal arterial catheterization although I do not have any information on this from Dr. Andree Elk. In fact I do not even see the initial revascularization that he had on the right. I have included the arterial history from Dr. Andree Elk last note however below; ASSESSMENT/PLAN: 1. Hx of Critical limb ischemia bilateral lower extremities, PAD: -s/p transmetatarsal amputation of the RLE. -s/p right ATA percutaneous revascularization procedure x2, most recently 11/20/2016 s/p PTA of right AT 100% to less than 20% with a 2.5 x 200 balloon. -On original angiogram 05/15/2016, he had a  significant 70-95% left popliteal artery stenosis with AT and PT artery occlusions and one vessel runoff via the peroneal artery. -03/21/2018 s/p PTA of 90% left popliteal artery to <10% with a 5x20 cutting balloon, PTA of 90% left peroneal to <20% with a 3x20 balloon, PTA of 100% left AT to <20% with a 2.5x220 balloon. 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 Will plan on the LLE soon after. Risks/benefits of procedure have been discussed and patient has elected to proceed. We have been using endoform to the right TMA amputation site wound and Santyl to the left great toe 10/07/2018; the area on the tip of his left great toe looked better we have been using Santyl here. We continue to have a very difficult probing hole on the right TMA amputation site we have been using endoform. I went on to use his fifth Oasis today 10/14/2018; the tip of the left great toe continues to look better. We have been using Santyl here the surface however is healthy and I think we can change to an alginate. The right TMA has not changed. Once again he has no superficial opening there is callus and thick subcutaneous tissue over the orifice once you remove this there is the probing area that we have been dealing with without too much change. There is no palpable bone I have been placing Oasis here and put Oasis #6 in this today after a more vigorous debridement 10/21/18; the left great toe still has necrotic surface requiring debridement. The right TMA site hasn't changed in view of the thick callus over the wound bed. With removal of this there is still the opening however this does not appear to have the same depth. Again there is no palpable bone. Oasis was replaced 10/28/2018; patient comes in with both wounds looking worse. The area over the first toe tip is now down  to bone. The area over the TMA site is deeper and down to bone clearly with a increase in overall wound area. Equally concerning on the right TMA is the complete absence of a pulse this week which is a change. We are not even able to Doppler this. On the right he has a noncompressible ABI greater than 1.4 11/04/2018. Both wounds look somewhat worse. X-rays showed no osteomyelitis of the right foot but on the left there was underlying osteomyelitis in the left great toe distal phalanx. I been on the phone to Dr. Andree Elk surface at [facility] in [place] and they are arranging for another angiogram on the right on Monday. I have him on doxycycline for the osteomyelitis in the left great toe for now. Infectious disease may be necessary 1/17; 2-week hiatus. Patient was admitted to hospital at [place]. My understanding is he underwent an angioplasty of the right  anterior tibial artery and had stents placed in the left anterior artery and the left tibial peroneal trunk. This was done by Dr. Andree Elk of interventional radiology. There is no major change in either 1 of the wounds. They have been using Aquacel Ag. As far as they are aware no imaging studies were done of the foot which is indeed unfortunate. I had him on doxycycline for 2 weeks since we identified the osteomyelitis in the left great toe by plain x-ray. I have renewed that again today. I am still suspicious about osteomyelitis in the amputation site and would consider doing another MRI to compare with the one done in September. The idea of hyperbaric oxygen certainly comes up for discussion 1/24; no major change in either wound area. I have him on doxycycline for osteomyelitis at the tip of the left great toe. As noted he has been previously and recently revascularized by Dr. Andree Elk at [place]. He is tolerating the doxycycline well. For some reason we do not have an infectious disease consult yet. Culture of drainage from the right foot site last week was  negative 1/31; MRI of the right foot did not show osteomyelitis of the right ankle and foot. Notable for a skin ulceration overlying the second metatarsal stump with generalizing soft tissue edema of the ankle and foot consistent with cellulitis. Noted to have a partial-thickness tear of the Achilles tendon 7.5 cm proximal to the insertion Nothing really new in terms of symptoms. Patient's area on the tip of the left great toe is just about closed although he still has the probing area on the metatarsal amputation site. Appointment with Dr. Linus Salmons of infectious disease next week 2/7; Dr. Novella Olive did not feel that any further antibiotics were necessary he would follow-up in 2 months. The left great toe appears to be closed still some surface callus that I gently looked under high did not see anything open or anything that was threatening to be open. He still has the open area on the mid part of his TMA site using endoform 2/14; left great toe is closed and he is completing his doxycycline as of last Sunday. He is not on any antibiotics. Unfortunately out of the right foot his wife noticed some subdermal hemorrhage this week. He had been walking 2 miles I had given him permission to do so this is not really a plantar wound. Using endoform to this wound but I changed to silver alginate this week 2/21; left great toe remains closed. Culture last week grew Streptococcus angiosis which I am not really familiar with however it is penicillin sensitive and I am going to put him on Augmentin. Not much change in the wound on the right foot the deep area is still probing precariously close to bone and the wound on the margin of the TMA is larger. 2/28; left great toe remains closed. He is completing the Augmentin I gave him last week. 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 This is being shown to Dr. Andree Elk at [place] to see if there is anything else that can be done here. He  has been using silver alginate strips on the right 3/6; left great toe remains closed. He sees Dr. Andree Elk on Monday. The area on the plantar aspect of the right foot has the same small orifice with thick callused tissue around this. However this time with removal of the callus tissue the wound is open all the way along the incision line  to the end medially. We have been using silver alginate. 3/13; left toe remains closed although the area is callused. He sees Dr. Jacqualyn Posey of podiatry next week. The area on the plantar right foot looked a lot better this week. Culture I did of this was negative we use silver alginate. He is going next week for an attempt at revascularization by Dr. Andree Elk 3/23; left toe remains closed although the area is callused. He will see Dr. Jacqualyn Posey in follow-up. The area on the right plantar foot continues to look surprisingly better over the last 3 visits. We have been using silver alginate. The revascularization he was supposed to have by Dr. Andree Elk at [facility] in [place] has been canceled [facility] procedure] 4/6; the right foot remains closed albeit callused. Podiatry canceled the appointment with regards to the left great toe. He has not seen Dr. Andree Elk at [facility] but thinks that Dr. Andree Elk has "done all he can do". He would be a candidate for the hemostaemix trial Readmission 07/29/2019 Mr. Stann Mainland is a man we know well from at least 3 previous stays in this clinic. He is a type II diabetic with severe PAD followed by Dr. Andree Elk at [facility] in [place]. 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 So far everything in both of these areas has remained closed. About 2 weeks ago he went to see his podiatrist [facility] Dr. Babs Bertin. He had a thick callus on the left fifth metatarsal head that was shaved. He has developed an open wound in this  area. They have been offloading this in his diabetic shoes. The patient has not had any more revascularizations by Dr. Andree Elk since the last time he was here. ABI in our clinic at the posterior tibial on the left was 1.06 10/6; no real change in the area on the plantar met head. Small wound with 2 mm of depth. He has thick skin probably from pressure around the wound. We have been using silver alginate 10/13; small wound in the left fifth plantar met head. Arrives today with undermining laterally and purulent drainage. Our intake nurse cultured this. His wife stated they noticed a change in color over the last day or 2. He is not systemically unwell 10/19; small wound on the fifth plantar metatarsal head. Culture I did last week showed Staphylococcus lugdunensis. Although this could be a skin contaminant the possibility of a skin and soft tissue infection was there. I did give him empiric doxycycline which should have covered this. We are using silver alginate to the wound. We put him in a total contact cast today. 10/22; small wound on the fifth plantar metatarsal head. He has completed antibiotics. He also has severe PAD which worries me about just about any wound on this man's foot 10/29; small superficial area on the fifth plantar metatarsal head. Measuring slightly smaller. He is not currently on any antibiotics. I been using a total contact cast in this man with very severe PAD but he seems to be tolerating this well 11/5; left plantar fifth metatarsal head. Silver alginate being used under a total contact cast 11/12; wound not much different than last week. Using a #15 scalpel debridement around the wound. Still silver collagen under a total contact cast. He has severe PAD and that may be playing a role in this 11/19;  disappointing that the wound is not really changed that much. I think it is come down in overall surface area because originally this was on the lateral part of the fifth  metatarsal head however recently it is not really changed. Most of this is filled in but it will not epithelialized there is surface debris on this which may be mostly related to ischemia. They have an appointment with Dr. Andree Elk on 12/9 09/24/2019 on evaluation today patient appears to be doing somewhat better with regard to the wound on the left fifth metatarsal head. Fortunately there does not appear to be any signs of active infection at this time. No fevers, chills, nausea, vomiting, or diarrhea. The wound does not appear to be completely closed and I do feel like the Hydrofera Blue is helping to some degree although I feel like the cast as well as keeping things from breaking down or getting any larger at least. As far as his wound overview it shows that the overall size of the wound is slightly smaller but really maintaining within the realm of about the same. He had no troubles with the cast which is good news. 12/1; left fifth metatarsal head. Perhaps somewhat more vibrant. We have been using Hydrofera Blue. He sees Dr. Andree Elk at [place] 1 week tomorrow. He be back next week and I hope to have a better idea which way the wound is going however I will not cast him next week in case Dr. Andree Elk wants to reevaluate things in his foot. The left leg is the leg with the stent in place and I wonder whether these are going to have to be reevaluated. 12/8; left fifth metatarsal head. Quite a bit better this week. Only a small open area remains. He sees Dr. Andree Elk at [place] tomorrow. Dr. Andree Elk is previously done his vascular interventions. He has 2 stents in the left leg as I remember things. I therefore will not put him in a total contact cast today. He will be using a forefoot off loader. We will use silver collagen on the wound. We will see him again next week Electronic Signature(s) Signed: 10/07/2019 5:53:37 PM By: [name] MD Entered By: [name] on 10/07/2019  15:47:49 -------------------------------------------------------------------------------- Physical Exam Details Patient Name: Date of Service: LASARO, PRIMM 10/07/2019 2:30 PM Medical Record [id number] Patient Account Number: [id number] Date of Birth/Sex: Treating RN: 1946/10/01 (73 y.o. M) Primary Care Provider: [name] Other Clinician: Referring Provider: Treating Provider/Extender:Daleiza Bacchi, [name], [name] in Treatment: 10 Constitutional Sitting or standing Blood Pressure is within target range for patient.. Pulse regular and within target range for patient.Marland Kitchen Respirations regular, non-labored and within target range.. Temperature is normal and within the target range for the patient.Marland Kitchen Appears in no distress. Eyes Conjunctivae clear. No discharge.no icterus. Respiratory work of breathing is normal. Cardiovascular Pedal pulses are absent on the left foot. Integumentary (Hair, Skin) No erythema around the wound. Psychiatric appears at normal baseline. Notes Wound exam; no sharp debridement required. There is only of small open area remaining. Most of this is thankfully epithelialized. No evidence of surrounding infection Electronic Signature(s) Signed: 10/07/2019 5:53:37 PM By: [name] MD Entered By: [name] on 10/07/2019 15:50:19 -------------------------------------------------------------------------------- Physician Orders Details Patient Name: Date of Service: KAHLEN, MORAIS 10/07/2019 2:30 PM Medical Record [id number] Patient Account Number: [id number] Date of Birth/Sex: Treating RN: [birth year] (73 y.o. [name] Primary Care Provider: [name] Other Clinician: Referring Provider: Treating Provider/Extender:Ricquel Foulk, [name], [name] in Treatment: 10 Verbal /  Phone Orders: No Diagnosis Coding ICD-10 Coding Code Description E11.621 Type 2 diabetes mellitus with foot ulcer E11.51 Type 2 diabetes  mellitus with diabetic peripheral angiopathy without gangrene L97.521 Non-pressure chronic ulcer of other part of left foot limited to breakdown of skin L03.116 Cellulitis of left lower limb Follow-up Appointments Return Appointment in 1 week. Dressing Change Frequency Wound #5 Left Metatarsal head fifth Change Dressing every other day. Wound Cleansing Wound #5 Left Metatarsal head fifth May shower and wash wound with soap and water. Primary Wound Dressing Wound #5 Left Metatarsal head fifth Hydrofera Blue - READY Secondary Dressing Wound #5 Left Metatarsal head fifth Kerlix/Rolled Gauze - secure with tape Dry Gauze Electronic Signature(s) Signed: 10/07/2019 5:53:37 PM By: [name] MD Signed: 10/08/2019 12:07:41 PM By: [name] RN Previous Signature: 10/07/2019 2:52:36 PM Version By: [name] RN Entered By: [name] on 10/07/2019 15:25:55 -------------------------------------------------------------------------------- Problem List Details Patient Name: Date of Service: CLEVEN, JANSMA 10/07/2019 2:30 PM Medical Record [id number] Patient Account Number: [id number] Date of Birth/Sex: Treating RN: 21-Nov-1945 (73 y.o. Staci Acosta, [place] Primary Care Provider: [name] Other Clinician: Referring Provider: Treating Provider/Extender:Kwasi Joung, [name], [name] in Treatment: 10 Active Problems ICD-10 Evaluated Encounter Code Description Active Date Today Diagnosis E11.621 Type 2 diabetes mellitus with foot ulcer 07/29/2019 No Yes E11.51 Type 2 diabetes mellitus with diabetic peripheral 07/29/2019 No Yes angiopathy without gangrene L97.521 Non-pressure chronic ulcer of other part of left foot 07/29/2019 No Yes limited to breakdown of skin L03.116 Cellulitis of left lower limb 08/12/2019 No Yes Inactive Problems Resolved Problems Electronic Signature(s) Signed: 10/07/2019 5:53:37 PM By: [name] MD Previous Signature: 10/07/2019 2:52:36 PM  Version By: [name] RN Entered By: [name] on 10/07/2019 15:45:54 -------------------------------------------------------------------------------- Progress Note Details Patient Name: Date of Service: MATTEUS, MCNELLY 10/07/2019 2:30 PM Medical Record [id number] Patient Account Number: [id number] Date of Birth/Sex: Treating RN: 1946-05-10 (73 y.o. M) Primary Care Provider: [name] Other Clinician: Referring Provider: Treating Provider/Extender:Sherard Sutch, [name], [name] in Treatment: 10 Subjective History of Present Illness (HPI) 05/18/16; this is a 73year-old diabetic who is a type II diabetic on insulin. The history is that he traumatized his right foot developed a sore sometime in late March. Shortly thereafter he went on a cruise but he had to get off the cruise ship in [place] and fly urgently back to [place] where he was admitted to [facility] and ultimately underwent a transmetatarsal amputation by Dr. 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 According to the patient and his wife this wound never really healed. He was seen on 2 occasions in the wound care center in [place] and had vascular studies and then was referred urgently to Dr. Bridgett Larsson of vascular surgery. He underwent an angiogram on 05/10/16. Unfortunately nothing really could be done to improve his vascular status. He had a 75-90% stenosis in the midsegment of 1 segment of the posterior femoral artery. He had a patent popliteal, his anterior tibial occluded shortly after takeoff. Perineal had a greater than 90% stenosis posterior tibial is occluded feet had no distal collaterals feed distal aspect of the transmetatarsal amputation site. The patient tells me that he had a prolonged period of Santyl by Dr. Doran Durand was some initial improvement but then this was stopped. I think they're only applying daily dressings/dry dressings. He has not had a recent x-ray of the right foot he did have  one before his surgery in April. His wife by the dimensions of the wound/surgical site being  followed at home since 4/20. At that point the dimensions were 0.5 x 12 x 0.2 on 7/19 this was 1.8 x 6 x 0.4. He is not currently on any antibiotics. His hemoglobin A1c in early April was 12.9 at that point he was started on insulin. Apparently his blood sugars are much lower he has an appointment with Dr. Legrand Como Alteimer of endocrine next week. 05/29/16 x-ray of the area did not show osteomyelitis. I think he probably needs an MRI at this point. His wife is asking about something called"Yireh" cream which is not FDA approved. I have not heard of this. 06/22/16; MRI did not really suggest osteomyelitis. There was minimal marrow edema and enhancement in the stump of the second metatarsal felt to be secondary likely to postoperative change rather than osteomyelitis. The patient has arranged his own consultation with Dr. Andree Elk at [place], apparently their daughter lives in [place] and has some connection here. Any improvement in vascular supply by Dr. Andree Elk would of course be helpful. Dr. Bridgett Larsson did not feel that anything further could be done other than amputation if wound care did not result in healing or if the area deteriorates. 06/26/16; the patient has been to see Dr. Andree Elk at [place] and had an angiogram. He is going for a procedure on Thursday which will involve catheterization. I'm not sure if this is an anterograde or retrograde approach. He has been using Santyl to the wound 07/10/16; the patient had a repeat angiogram and angioplasty at [place] by Dr. Brunetta Jeans. His angiogram showed right CFA and profundal widely patent. The right as of a.m. popliteal artery were widely patent the right anterior tibial was occluded proximally and reconstitutes at the ankle. Peroneal artery was patent to the foot. Posterior tibial artery was occluded. The patient had angioplasty of the anterior tibial artery.. This was quite  successful. He was recommended for Plavix as well as aspirin. 07/17/16; the patient was close to be a nurse visit today however the outer dressing of the Apligraf fell off. Noted drainage. I was asked to see the wound. The patient is noted an odor however his wife had noted that. Drainage with Apligraf not necessarily a bad thing. He has not been systemically unwell 07/24/16; we are still have an issue with drainage of this wound. In spite of this I applied his second Apligraf. Medially the area still is probing to bone. 08/07/16; Apligraf reapplied in general wound looks improved. 08/21/16 Apligraf #4. Wound looks much better 09/04/16 patientt's wound again today continues to appear to improve with the application of the Apligraf's. He notes no increased discomfort or concerns at this point in time. 09/18/16; the patient returns today 2 weeks after his fifth application of Apligraf. Predictably three quarters of the width of this wound has healed. The deep area that probe to bone medially is still open. The patient asked how much out-of-pocket dollars would be for additional Apligraf's. 09/25/16; now using Hydrofera Blue. He has completed 5 Apligraf applications with considerable improvement in this deep open transmetatarsal amputation site. His wound is now a triangular-shaped wound on the medial aspect. At roughly 12 to 2:00 this probes another centimeter but as opposed to in the past this does not probe to bone. The patient has been seen at [place] by Dr. Zenia Resides. He is not planning to do any more revascularization unless the wound stalls or worsens per the patient 10/02/16; 0.7 x 0.8 x 0.8. Unfortunately although the wound looks stable to improved. There is now  easily probable bone. This hasn't been present for several weeks. Patient is not otherwise symptomatic he is not experiencing any pain. I did a culture of the wound bed 10/09/16. Deterioration last week. Culture grew MRSA and although there is  improvement here with doxycycline prescribed over the phone I'm going to try to get him linezolid 600 twice a day for 10 days today. 10/16/16; he is completing a weeks worth of linezolid and still has 3 more days to go. Small triangular-shaped open area with some degree of undermining. There is still palpable bone with a curet. Overall the area appears better than last week 10/20/16 he has completed the linezolid still having some nausea and vomiting but no diarrhea. He has exposed bone this week which is a deterioration. 10/27/16 patient now has a small but probing wound down to bone. Culture of this bone that I did last week showed a few methicillin-resistant staph aureus. I have little doubt that this represents acute/subacute osteomyelitis. The patient is currently on Doxy which I will continue he also completed 10 days of linezolid. We are now in a difficult situation with this patient's foot after considerable discussion we will send him back to see Dr. Doran Durand for a surgical opinion of this I'm also going to try to arrange a infectious disease consult at [facility] hopefully week and get this prior to her usual 4-6 weeks we having [place]. The patient clearly is going to need 6 weeks of IV vancomycin. If we cannot arrange this expediently I'll have to consider ordering this myself through a home infusion company 11/03/16; the patient now has a small in terms of circumference but probing wound. No bone palpable today. The patient remains on doxycycline 100 twice a day which should support him until he sees infectious disease at [facility] next week the following week on Wednesday I believe he has an appointment with Dr. Andree Elk at [facility] who is his vascular cardiologist. Finally he has an appointment with Dr. Doran Durand on 11/22/16 we have been using silver alginate. The patient's wife states they are having trouble getting this through [facility] 11/13/16; the patient was seen by infectious disease at  [facility] in the 11th PICC line placed in preparation for IV antibiotics. A tummy he has not going to get IV vancomycin o Ceftaroline. They've also ordered an MRI. Patient has a follow-up with Dr. Doran Durand on 11/22/16 and Dr. Andree Elk at [facility] tomorrow 11/23/16 the patient is on daptomycin as directed by infectious disease at [facility]. He is also been back to see Dr. Andree Elk at [facility]. He underwent a repeat arteriogram. He had a successful PTA of the right anterior tibial artery. He is on dual antiplatelete treatment with Plavix and aspirin. Finally he had the MRI of his foot in [place]. This showed cellulitis about the foot worse distally edema and enhancement in the reminiscent of the second metatarsal was consistent with osteomyelitis therefore what I was assuming to be the first metatarsal may be actually the second. He also has a fluid collection deep to the calcaneus at the level of the calcaneal spur which could be an abscess or due to adventitial bursitis. He had a small tear in his Achilles 11/30/16; the patient continues on daptomycin as directed by infectious disease at [facility]. He is been revascularized by Dr. Andree Elk at [facility] in [place]. He has been to see Gretta Arab who was the orthopedic surgeon who did his original amputation. I have not seen his not however per the  patient's wife he did not offer another surgical local surgical prodecure to remove involved bone. He verbalized his usual disbelief in not just hyperbarics but any medical therapy for this condition(osteomyelitis). I discussed this in detail with the patient today including answering the question about a BKA definitively "curing" the current condition. 12/07/16; the patient continues on daptomycin as directed by infectious disease at [facility]. This is directed at the MRSA that we cultured from his bone debridement from 12/22. Lab work today shows a white count of 8.7 hemoglobin of 10.5 which is microcytic and  hypochromic differential count shows a slightly elevated monocyte count at 1.2 eosinophilic count of 0.6. His creatinine is 1.11 sedimentation rate apparently is gone from 35-34 now 40. I explained was wife I don't think this represents a trend. His total CK is 48 12/14/16- patient is here for follow-up evaluation of his right TMA site. He continues to receive IV daptomycin per infectious disease. His serum inflammatory markers remain elevated. He complains of intermittent pain to the medial aspect of the TMA site with intermittent erythema. He voices no complaints or concerns regarding hyperbaric therapy. Overall he and his wife are expressing a frustration and discouragement regarrding the length of time of treatment. 12/21/16; small open wound at roughly the first or second metatarsal metatarsalphalyngeal joint reminiscence of his transmetatarsal amputation site he continues to receive IV daptomycin per infectious disease at [facility]. He will finish these a week tomorrow. He has lab work which I been copied on. His white count is 10.8 hemoglobin 8.9 MCV is low at 75., MCH low at 24.5 platelet count slightly elevated at 626. Differential count shows 70% neutrophils 10% monocytes and 10% eosinophils. His comprehensive metabolic panel shows a slightly low sodium at 133 albumin low at 3.1 total CK is normal at 42 sedimentation rate is much higher at 82. He has had iron studies that show a serum iron of 15 and iron binding capacity of 238 and iron saturation of 6. This is suggestive of iron deficiency. B12 and folate were normal ferritin at 113 The patient tells me that he is not eating well and he has lost weight. He feels episodically nauseated. He is coughing and gagging on mucus which she thinks is sinusitis. He has an appointment with his primary doctor at 5:00 this afternoon in [facility] 01/02/17; the patient developed a subacute pneumonitis. He was admitted to [facility] after a CT  scan showed an extensive interstitial pneumonitis [I have not yet seen this]. 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 He has since been discharged. He is not on oxygen. He feels fatigued and very short of breath with exertion. At [facility] the wound care nurse there felt that his wound was healed. He did complete his daptomycin and has follow-up with infectious disease on Friday. X-rays I did before he went to [facility] still suggested residual osteomyelitis in the anterior aspect of the second must metatarsal head. I'm not sure I would've expected any different. There was no other findings. I actually think I did this because of erythema over the first metatarsal head reminiscent 01/11/17; the patient has eosinophilic pneumonitis. He has been reviewed by pulmonology and given clearance for hyperbarics at least that's what his wife says. I'll need to see if there is note in care everywhere. Apparently the prognosis for improvement of daptomycin induced eosinophilic granulocyte is is 3 months without steroids. In the meantime  infectious disease has placed him on doxycycline until the wound is closed. He is still is lost a lot of weight and his blood sugars are running in the mid 60s to low 80s fasting and at all times during the day 01/18/17; he has had adjustments in his insulin apparently his blood sugars in the morning or over 100. He wants to restart his hyperbaric treatment we'll do this at 1:00. 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 I think there is good reason to complete his treatments in order to give him the best chance of maintaining a healed status and these DFU 3 wounds with MRSA infection in the bone 02/15/17; the patient was seen today in conjunction with HBO. He completed hyperbaric oxygen today. The  open area on his transmetatarsal site has remained closed. There was an area of erythema when I saw him earlier in the week on the posterior heel although that is resolved as of today as well. He has been using a cam walker. This is a patient who came to [place] after a transmetatarsal amputation that was necrotic and dehisced. He required revascularization percutaneously on 2 different occasions by Dr. Andree Elk of invasive cardiology at [facility]. 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 He went to [facility] infectious disease and had IV daptomycin for 5 weeks before developing eosinophilic pneumonitis and requiring an admission to hospital/ICU. He made a good recovery and is continued on doxycycline since. As mentioned his foot is closed now. He has a follow-up with Dr. Andree Elk tomorrow. He is going to [employer] on Monday for a custom-made shoe READMISSION Last visit Dr. Andree Elk [facility] 02/16/17 1. Critical limb ischemia of the RLE:  s/p transmetatarsal amputation of the RLE, now completely healed.  s/p right ATA percutaneous revascularization procedure x2, most recently 11/20/2016 s/p PTA of right AT 100% to less than 20% with a 2.5 x 200 balloon.  He does have some residual osteomyelitis, but given the wound is closed, the orthopedist has recommended to follow. He has a fitting for a special shoe coming up next week. He has completed a course of daptomycin and doxycycline and he has been discharged by ID. He has completed a course of hyperbaric therapy.  Continue Continue medical management with aspirin, Plavix and statin therapy. We will discuss ongoing Plavix therapy at follow-up in 6 months.  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 Given no symptoms, we will  conservatively manage. He doesn't want to do any more invasive studies at this time, which is reasonable. The patient arrives today out of 2 concerns both on the right transmetatarsal site. 1 at the level of the reminiscent fifth metatarsal head and the other at roughly the first or second. Both of these look like dark subcutaneous discoloration probably subdermal bleeding. He is recently obtained new adaptive footwear for the right foot. He also has a callus on the left fifth dorsal toe however he follows with podiatry for this and I don't think this is any issue. ABIs in this clinic today were 0.66 on the right and 1.06 on the left. On entrance into our clinic initially this was 0.95 and 0.92. He does not describe current claudication. They're going away on a cruise in 8  weeks and I think are trying to do the month is much as they can proactively. They follow with podiatry and have an appointment with Dr. Andree Elk in October READMISSION 06/25/18 This is a patient that we have not seen in almost a year. He is a type II diabetic with known PAD. He is followed by Dr. Andree Elk of interventional cardiology at [facility] in [place]. He is required revascularization for significant PAD. When we first saw him he required a transmetatarsal amputation. He had underlying osteomyelitis with a nonhealing surgical wound. This eventually closed with wound care, IV antibiotics and hyperbaric oxygen. They tell me that he has a modified shoe and he is very active walking up to 4 miles a day. He is followed by Dr. Geroge Baseman of podiatry. His wife states that he underwent a removal of callus over this site on July 9. She felt there may be drainage from this site after that although she could never really determined and there was callus buildup again. On 06/01/18 there was pressure bleeding through the overlying callus. he was given a prescription for 7 days of Bactrim. Fortuitously he has an appointment with Dr. Andree Elk on  06/04/18 and he immediately underwent revascularization of the right leg although I have not had a chance to review these records in care everywhere. This was apparently done through anterior and retrograde access. On 06/06/18 he had another debridement by Dr. Bernette Mayers. Vitamin soaking with Epsom salts for 20 minutes and applying calcium alginate. The original trans-met was on April 2017 I believe by Dr. Doran Durand. His ABI in our clinic was noncompressible today. 07/02/18; x-ray I ordered last week was negative for osteomyelitis. Swab culture was also negative. He is going to require an MRI which I have ordered today. 07/09/18; surprisingly the MRI of the foot that I ordered did not show osteomyelitis. He did suggest the possibility of cellulitis. For this reason I'll go ahead and give him a 10 day course of doxycycline. Although the previous culture of this area was negative 07/16/18; it arrives with the wound looking much the same. Roughly the same depth. He has thick subcutaneous tissue around the wound orifice but this still has roughly the same depth. Been using silver alginate. We applied Oasis #1 today 07/23/2018; still having to remove a lot of callus and thick subcutaneous tissue to actually define the wound here. Most of this seems to have closed down yet he has a comma shaped divot over the top of the area that still I think is open. We applied Oasis #2 His wife expressed concern about the tip of his left great toe. This almost looks like a small blister. She also showed it today to her podiatrist Dr. Geroge Baseman who did not think this was anything serious. I am not sure is anything serious either however I think it bears some watching. He is not in any pain however he is insensate 07/30/2018;; still on a lot of nonviable tissue over the surface of the wound however cleaning this up reveals a more substantial wound orifice but less of a probing wound depth. This is not probed to bone. There is no  evidence of infection The wife is still concerned about a non-open area on the tip of his left great toe. Almost feels like a bony outgrowth. She had previously showed this to podiatry. I do not think this is a blister. A friction area would be possible although he is really not walking according to his wife. He had  a small skin tag on his right buttock but no open wound here either 08/06/2018; we applied a third Oasis last week. Unfortunately there is really no improvement. Still requiring extensive debridement to expose the wound bed from a horizontal slitlike depression. I still have not been able to get this to fill in properly. On the positive side there is now no probable bone from when he first came into the facility. I changed him to silver alginate today after a reasonably aggressive debridement 08/13/2018; once again the patient comes in with skin and subcutaneous tissue closing over the small probing area with the underlying cavity of the wound on the right TMA site. I applied silver alginate to this last week. Prior to that we used Oasis x3 still not able to get this area granulating. He does not have a probing area of the bone which is an improvement from when I spur started working on this and an MRI did not suggest osteomyelitis. I do not see evidence of infection here but I am increasingly concerned about why I cannot get this area to granulate. Each time I debrided this is looks like this is simply a matter of getting granulation to fill in the hole and then getting epithelialization. This does not seem to happen Also I sent him back to see podiatry Dr. Earleen Newport about the what felt to be bony outgrowth on the tip of his left great toe. Apparently after heel he left the clinic last week or the next day he developed a blood blister. He did see Dr. Earleen Newport. He went on to have a debridement of the medial nail cuticle he now has an open area here as well as some denuded skin. They did an  x-ray apparently does have a bony outgrowth or spur but I am not able to look at this. They are using topical antibiotics apparently there was some suggested he use Santyl. Patient's wife was anxious for my opinion of this 08/20/2018; we are able to keep the wound open this time instead of the thick subcutaneous tissue closing over the top of it however unfortunately once again this probes to bone. I do not see any evidence of infection and previous MRI did not show osteomyelitis. I elected to go back to the Oasis to see if we can stimulate some granulation. Last saw his vascular interventional cardiologist Dr. Andree Elk at the beginning of August and he had a repeat procedure. Nevertheless I wonder how much blood flow he has down to this area With regards to the left first toe he is seeing Dr. Earleen Newport next week. They are applying Bactroban to this area. 08/27/2018 ooOnce again he comes in with thick eschar and subcutaneous tissue over the top of the small probing hole. This does not appear to go down to bone but it still has roughly the same depth. I reapplied Oasis today ooOver the left great toe there appears to be more of the wound at the tip of his toe than there was last week. This has an eschar on the surface of it. Will change to Santyl. There is seeing podiatry this afternoon 09/03/2018 ooHe comes in today with the area on the transmetatarsal's site with a fair amount of callus, nonviable tissue over the circumference but it was not closed. I removed all of this as well as some subcutaneous debris and reapplied Oasis. There is no exposed bone ooThe area over the tip of the left great toe started off as a nodule of  uncertain etiology. He has been followed with podiatry. They have been removing part of the medial nail bed. He has nonviable tissue over the wound he been using Santyl in this area 09/10/18 ooUnfortunately comes in with neither wound area looking improved. The transmetatarsal  amputation site once Again has nonviable debris over the surface requiring debridement. Unfortunately underneath this there is nothing that looks viable and this once again goes right down to bone. There is no purulent drainage and no erythema. ooAlso the surgical wound from podiatry on the left first toe has an ischemic-looking eschar over the surface of the tip of the toe I have elected not to attempt candidly debride this ooHis wife as arranged for him to have follow-up noninvasive studies in [place] under the care of Dr. Andree Elk clinic. The question is he has known severe PAD. They had recently seen him in August. He has had revascularizations in both legs within the last 4 or 5 months. He is not complaining of pain and I cannot really get a history of claudication. He has not systemically unwell 09/20/2018 the patient has been to [facility] and been revascularized by Dr. Andree Elk earlier this week. Apparently he was able to open up the anterior tibial artery although I have not actually seen his formal report. He is going for an attempt to revascularize on the left on Monday. He is apparently working with an investigational stent for lower extremity arteries below the knee and he has talked to the patient about placing that on Monday if possible. The patient has been using silver alginate on the transmetatarsal amputation site and Santyl on the left 09/30/2018; patient had his revascularization on the left this apparently included a standard approach as well as a more distal arterial catheterization although I do not have any information on this from Dr. Andree Elk. In fact I do not even see the initial revascularization that he had on the right. I have included the arterial history from Dr. Andree Elk last note however below; ASSESSMENT/PLAN: 1. Hx of Critical limb ischemia bilateral lower extremities, PAD: -s/p transmetatarsal amputation of the RLE. -s/p right ATA percutaneous revascularization  procedure x2, most recently 11/20/2016 s/p PTA of right AT 100% to less than 20% with a 2.5 x 200 balloon. 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 -03/21/2018 s/p PTA of 90% left popliteal artery to <10% with a 5x20 cutting balloon, PTA of 90% left peroneal to <20% with a 3x20 balloon, PTA of 100% left AT to <20% with a 2.5x220 balloon. 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 Will plan on the LLE soon after. Risks/benefits of procedure have been discussed and patient has elected to proceed. We have been using endoform to the right TMA amputation site wound and Santyl to the left great toe 10/07/2018; the area on the tip of his left great toe looked better we have been using Santyl here. We continue to have a very difficult probing hole on the right TMA amputation site we have been using endoform. I went on to use his fifth Oasis today 10/14/2018; the tip of the left great toe continues to look better. We have been using Santyl here the surface however is healthy and I think we can change to  an alginate. The right TMA has not changed. Once again he has no superficial opening there is callus and thick subcutaneous tissue over the orifice once you remove this there is the probing area that we have been dealing with without too much change. There is no palpable bone I have been placing Oasis here and put Oasis #6 in this today after a more vigorous debridement 10/21/18; the left great toe still has necrotic surface requiring debridement. The right TMA site hasn't changed in view of the thick callus over the wound bed. With removal of this there is still the opening however this does not appear to have the same depth.  Again there is no palpable bone. Oasis was replaced 10/28/2018; patient comes in with both wounds looking worse. The area over the first toe tip is now down to bone. The area over the TMA site is deeper and down to bone clearly with a increase in overall wound area. Equally concerning on the right TMA is the complete absence of a pulse this week which is a change. We are not even able to Doppler this. On the right he has a noncompressible ABI greater than 1.4 11/04/2018. Both wounds look somewhat worse. X-rays showed no osteomyelitis of the right foot but on the left there was underlying osteomyelitis in the left great toe distal phalanx. I been on the phone to Dr. Andree Elk surface at [facility] in [place] and they are arranging for another angiogram on the right on Monday. I have him on doxycycline for the osteomyelitis in the left great toe for now. Infectious disease may be necessary 1/17; 2-week hiatus. Patient was admitted to hospital at [place]. My understanding is he underwent an angioplasty of the right anterior tibial artery and had stents placed in the left anterior artery and the left tibial peroneal trunk. This was done by Dr. Andree Elk of interventional radiology. There is no major change in either 1 of the wounds. They have been using Aquacel Ag. As far as they are aware no imaging studies were done of the foot which is indeed unfortunate. I had him on doxycycline for 2 weeks since we identified the osteomyelitis in the left great toe by plain x-ray. I have renewed that again today. I am still suspicious about osteomyelitis in the amputation site and would consider doing another MRI to compare with the one done in September. The idea of hyperbaric oxygen certainly comes up for discussion 1/24; no major change in either wound area. I have him on doxycycline for osteomyelitis at the tip of the left great toe. As noted he has been previously and recently revascularized by Dr. Andree Elk at [place]. He is  tolerating the doxycycline well. For some reason we do not have an infectious disease consult yet. Culture of drainage from the right foot site last week was negative 1/31; MRI of the right foot did not show osteomyelitis of the right ankle and foot. Notable for a skin ulceration overlying the second metatarsal stump with generalizing soft tissue edema of the ankle and foot consistent with cellulitis. Noted to have a partial-thickness tear of the Achilles tendon 7.5 cm proximal to the insertion Nothing really new in terms of symptoms. Patient's area on the tip of the left great toe is just about closed although he still has the probing area on the metatarsal amputation site. Appointment with Dr. Linus Salmons of infectious disease next week 2/7; Dr. Novella Olive did not feel that any further antibiotics were necessary he would follow-up  in 2 months. The left great toe appears to be closed still some surface callus that I gently looked under high did not see anything open or anything that was threatening to be open. He still has the open area on the mid part of his TMA site using endoform 2/14; left great toe is closed and he is completing his doxycycline as of last Sunday. He is not on any antibiotics. Unfortunately out of the right foot his wife noticed some subdermal hemorrhage this week. He had been walking 2 miles I had given him permission to do so this is not really a plantar wound. Using endoform to this wound but I changed to silver alginate this week 2/21; left great toe remains closed. Culture last week grew Streptococcus angiosis which I am not really familiar with however it is penicillin sensitive and I am going to put him on Augmentin. Not much change in the wound on the right foot the deep area is still probing precariously close to bone and the wound on the margin of the TMA is larger. 2/28; left great toe remains closed. He is completing the Augmentin I gave him last week. 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 This is being shown to Dr. Andree Elk at [place] to see if there is anything else that can be done here. He has been using silver alginate strips on the right 3/6; left great toe remains closed. He sees Dr. Andree Elk on Monday. The area on the plantar aspect of the right foot has the same small orifice with thick callused tissue around this. However this time with removal of the callus tissue the wound is open all the way along the incision line to the end medially. We have been using silver alginate. 3/13; left toe remains closed although the area is callused. He sees Dr. Jacqualyn Posey of podiatry next week. The area on the plantar right foot looked a lot better this week. Culture I did of this was negative we use silver alginate. He is going next week for an attempt at revascularization by Dr. Andree Elk 3/23; left toe remains closed although the area is callused. He will see Dr. Jacqualyn Posey in follow-up. The area on the right plantar foot continues to look surprisingly better over the last 3 visits. We have been using silver alginate. The revascularization he was supposed to have by Dr. Andree Elk at [facility] in [place] has been canceled [facility] procedure] 4/6; the right foot remains closed albeit callused. Podiatry canceled the appointment with regards to the left great toe. He has not seen Dr. Andree Elk at [facility] but thinks that Dr. Andree Elk has "done all he can do". He would be a candidate for the hemostaemix trial Readmission 07/29/2019 Mr. Stann Mainland is a man we know well from at least 3 previous stays in this clinic. He is a type II diabetic with severe PAD followed by Dr. Andree Elk at [facility] in [place]. 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 So far everything in both of these areas has remained closed. About 2 weeks ago he went to  see his podiatrist at [facility] Dr. Babs Bertin. He had a thick callus on the left fifth metatarsal head that was shaved. He has developed an open wound in this area. They have been offloading this in his diabetic shoes.  The patient has not had any more revascularizations by Dr. Andree Elk since the last time he was here. ABI in our clinic at the posterior tibial on the left was 1.06 10/6; no real change in the area on the plantar met head. Small wound with 2 mm of depth. He has thick skin probably from pressure around the wound. We have been using silver alginate 10/13; small wound in the left fifth plantar met head. Arrives today with undermining laterally and purulent drainage. Our intake nurse cultured this. His wife stated they noticed a change in color over the last day or 2. He is not systemically unwell 10/19; small wound on the fifth plantar metatarsal head. Culture I did last week showed Staphylococcus lugdunensis. Although this could be a skin contaminant the possibility of a skin and soft tissue infection was there. I did give him empiric doxycycline which should have covered this. We are using silver alginate to the wound. We put him in a total contact cast today. 10/22; small wound on the fifth plantar metatarsal head. He has completed antibiotics. He also has severe PAD which worries me about just about any wound on this man's foot 10/29; small superficial area on the fifth plantar metatarsal head. Measuring slightly smaller. He is not currently on any antibiotics. I been using a total contact cast in this man with very severe PAD but he seems to be tolerating this well 11/5; left plantar fifth metatarsal head. Silver alginate being used under a total contact cast 11/12; wound not much different than last week. Using a #15 scalpel debridement around the wound. Still silver collagen under a total contact cast. He has severe PAD and that may be playing a role in this 11/19;  disappointing that the wound is not really changed that much. I think it is come down in overall surface area because originally this was on the lateral part of the fifth metatarsal head however recently it is not really changed. Most of this is filled in but it will not epithelialized there is surface debris on this which may be mostly related to ischemia. They have an appointment with Dr. Andree Elk on 12/9 09/24/2019 on evaluation today patient appears to be doing somewhat better with regard to the wound on the left fifth metatarsal head. Fortunately there does not appear to be any signs of active infection at this time. No fevers, chills, nausea, vomiting, or diarrhea. The wound does not appear to be completely closed and I do feel like the Hydrofera Blue is helping to some degree although I feel like the cast as well as keeping things from breaking down or getting any larger at least. As far as his wound overview it shows that the overall size of the wound is slightly smaller but really maintaining within the realm of about the same. He had no troubles with the cast which is good news. 12/1; left fifth metatarsal head. Perhaps somewhat more vibrant. We have been using Hydrofera Blue. He sees Dr. Andree Elk at [place] 1 week tomorrow. He be back next week and I hope to have a better idea which way the wound is going however I will not cast him next week in case Dr. Andree Elk wants to reevaluate things in his foot. The left leg is the leg with the stent in place and I wonder whether these are going to have to be reevaluated. 12/8; left fifth metatarsal head. Quite a bit better this week. Only a small open area remains. He sees  Dr. Andree Elk at [place] tomorrow. Dr. Andree Elk is previously done his vascular interventions. He has 2 stents in the left leg as I remember things. I therefore will not put him in a total contact cast today. He will be using a forefoot off loader. We will use silver collagen on the wound. We will see  him again next week Objective Constitutional Sitting or standing Blood Pressure is within target range for patient.. Pulse regular and within target range for patient.Marland Kitchen Respirations regular, non-labored and within target range.. Temperature is normal and within the target range for the patient.Marland Kitchen Appears in no distress. Vitals Time Taken: 2:30 PM, Height: 69 in, Weight: 210 lbs, BMI: 31, Temperature: 98.8 F, Pulse: 70 bpm, Respiratory Rate: 18 breaths/min, Blood Pressure: 132/75 mmHg, Capillary Blood Glucose: 100 mg/dl. Eyes Conjunctivae clear. No discharge.no icterus. Respiratory work of breathing is normal. Cardiovascular Pedal pulses are absent on the left foot. Psychiatric appears at normal baseline. General Notes: Wound exam; no sharp debridement required. There is only of small open area remaining. Most of this is thankfully epithelialized. No evidence of surrounding infection Integumentary (Hair, Skin) No erythema around the wound. Wound #5 status is Open. Original cause of wound was Gradually Appeared. The wound is located on the Left Metatarsal head fifth. The wound measures 0.2cm length x 0.3cm width x 0.1cm depth; 0.047cm^2 area and 0.005cm^3 volume. There is Fat Layer (Subcutaneous Tissue) Exposed exposed. There is no tunneling or undermining noted. There is a medium amount of serosanguineous drainage noted. The wound margin is thickened. There is large (67-100%) pink granulation within the wound bed. There is no necrotic tissue within the wound bed. Assessment Active Problems ICD-10 Type 2 diabetes mellitus with foot ulcer Type 2 diabetes mellitus with diabetic peripheral angiopathy without gangrene Non-pressure chronic ulcer of other part of left foot limited to breakdown of skin Cellulitis of left lower limb Plan Follow-up Appointments: Return Appointment in 1 week. Dressing Change Frequency: Wound #5 Left Metatarsal head fifth: Change Dressing every other  day. Wound Cleansing: Wound #5 Left Metatarsal head fifth: May shower and wash wound with soap and water. Primary Wound Dressing: Wound #5 Left Metatarsal head fifth: Hydrofera Blue - READY Secondary Dressing: Wound #5 Left Metatarsal head fifth: Kerlix/Rolled Gauze - secure with tape Dry Gauze 1. Left fifth metatarsal head we are going to use silver collagen and border foam that his wife will change daily 2. I do not feel pulses in the left foot although the foot remains warm. We will see Dr. Andree Elk tomorrow no doubt he is going to set up evaluation of the sstents. I will wait for this to go through before considering rather to recast him or not Electronic Signature(s) Signed: 10/07/2019 5:53:37 PM By: [name] MD Entered By: [name] on 10/07/2019 15:51:21 -------------------------------------------------------------------------------- [facility] Details Patient Name: Date of Service: EUEL, CASTILE 10/07/2019 Medical Record [id number] Patient Account Number: [id number] Date of Birth/Sex: Treating RN: December 25, 1945 (73 y.o. [name]) [name] Primary Care Provider: [name] Other Clinician: Referring Provider: Treating Provider/Extender:Roshanda Balazs, [name], [name] in Treatment: 10 Diagnosis Coding ICD-10 Codes Code Description E11.621 Type 2 diabetes mellitus with foot ulcer E11.51 Type 2 diabetes mellitus with diabetic peripheral angiopathy without gangrene L97.521 Non-pressure chronic ulcer of other part of left foot limited to breakdown of skin L03.116 Cellulitis of left lower limb Facility Procedures Physician Procedures CPT4 Code Description: [id number] - WC PHYS LEVEL 3 - EST PT ICD-10 Diagnosis Description L97.521 Non-pressure chronic ulcer of other part of  left foot li E11.621 Type 2 diabetes mellitus with foot ulcer Modifier: mited to breakdo Quantity: 1 wn of skin Electronic Signature(s) Signed: 10/07/2019 5:53:37 PM By: [name]  MD Entered By: [name] on 10/07/2019 15:51:39

## 2019-10-08 NOTE — Progress Notes (Signed)
Todd, Guzman ([id number]) Visit Report for 10/07/2019 Arrival Information Details Patient Name: Date of Service: [name], [name] 10/07/2019 2:30 PM Medical Record [id number] Patient Account Number: [id number] Date of Birth/Sex: Treating RN: Mar 05, 1946 (73 y.o. Lorette Ang, Meta.Reding Primary Care Holland Nickson: [name] Other Clinician: Referring Blessing Zaucha: Treating Maryalyce Sanjuan/Extender:[name], [name], [name] in Treatment: 10 Visit Information History Since Last Visit Added or deleted any medications: No Patient Arrived: Ambulatory Any new allergies or adverse reactions: No Arrival Time: 14:26 Had a fall or experienced change in No Accompanied By: wife activities of daily living that may affect Transfer Assistance: None risk of falls: Patient Identification Verified: Yes Signs or symptoms of abuse/neglect since No Secondary Verification Process Yes last visito Completed: Hospitalized since last visit: No Patient Requires Transmission-Based No Implantable device outside of the clinic No Precautions: excluding Patient Has Alerts: No cellular tissue based products placed in the center since last visit: Has Dressing in Place as Prescribed: Yes Has Footwear/Offloading in Place as Yes Prescribed: Left: Total Contact Cast Pain Present Now: No Electronic Signature(s) Signed: 10/07/2019 5:32:49 PM By: Deon Pilling Entered By: Deon Pilling on 10/07/2019 14:40:50 -------------------------------------------------------------------------------- Clinic Level of Care Assessment Details Patient Name: Date of Service: [name], [name] 10/07/2019 2:30 PM Medical Record [id number] Patient Account Number: [id number] Date of Birth/Sex: Treating RN: 1945-12-17 (73 y.o. [name]) [name] Primary Care Prisilla Kocsis: [name] Other Clinician: Referring Charleston Vierling: Treating Tristyn Demarest/Extender:[name], [name], [name] in Treatment: 10 Clinic Level of Care Assessment  Items TOOL 4 Quantity Score X - Use when only an EandM is performed on FOLLOW-UP visit 1 0 ASSESSMENTS - Nursing Assessment / Reassessment X - Reassessment of Co-morbidities (includes updates in patient status) 1 10 X - Reassessment of Adherence to Treatment Plan 1 5 ASSESSMENTS - Wound and Skin Assessment / Reassessment X - Simple Wound Assessment / Reassessment - one wound 1 5 []  - Complex Wound Assessment / Reassessment - multiple wounds 0 []  - Dermatologic / Skin Assessment (not related to wound area) 0 ASSESSMENTS - Focused Assessment []  - Circumferential Edema Measurements - multi extremities 0 []  - Nutritional Assessment / Counseling / Intervention 0 []  - Lower Extremity Assessment (monofilament, tuning fork, pulses) 0 []  - Peripheral Arterial Disease Assessment (using hand held doppler) 0 ASSESSMENTS - Ostomy and/or Continence Assessment and Care []  - Incontinence Assessment and Management 0 []  - Ostomy Care Assessment and Management (repouching, etc.) 0 PROCESS - Coordination of Care X - Simple Patient / Family Education for ongoing care 1 15 []  - Complex (extensive) Patient / Family Education for ongoing care 0 X - Staff obtains Consents, Records, Test Results / Process Orders 1 10 []  - Staff telephones HHA, Nursing Homes / Clarify orders / etc 0 []  - Routine Transfer to another Facility (non-emergent condition) 0 []  - Routine Hospital Admission (non-emergent condition) 0 []  - New Admissions / Biomedical engineer / Ordering NPWT, Apligraf, etc. 0 []  - Emergency Hospital Admission (emergent condition) 0 X - Simple Discharge Coordination 1 10 []  - Complex (extensive) Discharge Coordination 0 PROCESS - Special Needs []  - Pediatric / Minor Patient Management 0 []  - Isolation Patient Management 0 []  - Hearing / Language / Visual special needs 0 []  - Assessment of Community assistance (transportation, D/C planning, etc.) 0 []  - Additional assistance / Altered mentation  0 []  - Support Surface(s) Assessment (bed, cushion, seat, etc.) 0 INTERVENTIONS - Wound Cleansing / Measurement X - Simple Wound Cleansing - one wound 1 5 []  - Complex Wound Cleansing - multiple  wounds 0 X - Wound Imaging (photographs - any number of wounds) 1 5 []  - Wound Tracing (instead of photographs) 0 X - Simple Wound Measurement - one wound 1 5 []  - Complex Wound Measurement - multiple wounds 0 INTERVENTIONS - Wound Dressings []  - Small Wound Dressing one or multiple wounds 0 X - Medium Wound Dressing one or multiple wounds 1 15 []  - Large Wound Dressing one or multiple wounds 0 X - Application of Medications - topical 1 5 []  - Application of Medications - injection 0 INTERVENTIONS - Miscellaneous []  - External ear exam 0 []  - Specimen Collection (cultures, biopsies, blood, body fluids, etc.) 0 []  - Specimen(s) / Culture(s) sent or taken to Lab for analysis 0 []  - Patient Transfer (multiple staff / / Similar devices) 0 []  - Simple Staple / Suture removal (25 or less) 0 []  - Complex Staple / Suture removal (26 or more) 0 []  - Hypo / Hyperglycemic Management (close monitor of Blood Glucose) 0 []  - Ankle / Brachial Index (ABI) - do not check if billed separately 0 X - Vital Signs 1 5 Has the patient been seen at the hospital within the last three years: Yes Total Score: 95 Level Of Care: New/Established - Level 3 Electronic Signature(s) Signed: 10/08/2019 12:07:41 PM By: RN Entered By: on 10/07/2019 15:32:23 -------------------------------------------------------------------------------- Encounter Discharge Information Details Patient Name: Date of Service: [name], [name] 10/07/2019 2:30 PM Medical Record Patient Account Number: Date of Birth/Sex: Treating RN: 10-Feb-1946 (73 y.o. Primary Care [name]: Other Clinician: Referring Teressa Mcglocklin: Treating Lakeia Bradshaw/Extender:[name],  , 14/07/2019 in Treatment: 10 Encounter Discharge Information Items Discharge Condition: Stable Ambulatory Status: Ambulatory Discharge Destination: Home Transportation: Private Auto Accompanied By: wife Schedule Follow-up Appointment: Yes Clinical Summary of Care: Patient Declined Electronic Signature(s) Signed: 10/08/2019 12:04:56 PM By: [name] Entered By: 14/05/2019 on 10/07/2019 15:51:27 -------------------------------------------------------------------------------- Lower Extremity Assessment Details Patient Name: Date of Service: [name], [name] 10/07/2019 2:30 PM Medical Record [id number] Patient Account Number: 08/30/1946 Date of Birth/Sex: Treating RN: [birth year] (73 y.o. [name] Primary Care Winnie Umali: [name] Other Clinician: Referring Saxon Barich: Treating Sher Shampine/Extender:[name], [name], 14/07/2019 in Treatment: 10 Edema Assessment Assessed: Cherylin Mylar: Yes] [Right: No] Edema: [Left: N] [Right: o] Calf Left: Right: Point of Measurement: cm From Medial Instep 38.5 cm cm Ankle Left: Right: Point of Measurement: cm From Medial Instep 27.5 cm cm Electronic Signature(s) Signed: 10/07/2019 5:32:49 PM By: 14/05/2019 Entered By: Jeraldine Loots on 10/07/2019 14:41:45 -------------------------------------------------------------------------------- Multi Wound Chart Details Patient Name: Date of Service: [name], [name] 10/07/2019 2:30 PM Medical Record 08/30/1946 Patient Account Number: 08-28-1986 Date of Birth/Sex: Treating RN: 1946-09-17 (73 y.o. 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] Comorbid History: [5:Cataracts, Chronic sinus N/A problems/congestion, Coronary Artery Disease, Hypertension, Peripheral Arterial Disease, Type II Diabetes, Osteoarthritis, Neuropathy] Date Acquired: [5:07/14/2019] [N/A:N/A] Weeks of Treatment: [5:10] [N/A:N/A] Wound Status: [5:Open] [N/A:N/A] Measurements L x W x D 0.2x0.3x0.1 [N/A:N/A] (cm) Area (cm) : [5:0.047] [N/A:N/A] Volume (cm) : [5:0.005] [N/A:N/A] % Reduction in Area: [5:25.40%] [N/A:N/A] % Reduction in Volume: 61.50% [N/A:N/A] Classification: [  5:Grade 1] [N/A:N/A] Exudate Amount: [5:Medium] [N/A:N/A] Exudate Type: [5:Serosanguineous] [N/A:N/A] Exudate Color: [5:red, brown] [N/A:N/A] Wound Margin: [5:Thickened] [N/A:N/A] Granulation Amount: [5:Large (67-100%)] [N/A:N/A] Granulation Quality: [5:Pink] [N/A:N/A] Necrotic Amount: [5:None Present (0%)] [N/A:N/A] Exposed Structures: [5:Fat Layer (Subcutaneous N/A Tissue) Exposed: Yes Fascia: No Tendon: No Muscle: No Joint: No Bone: No Large (67-100%)] [N/A:N/A] Treatment Notes Electronic Signature(s) Signed: 10/07/2019 5:53:37 PM By: Baltazar Najjar MD Entered By: Baltazar Najjar on 10/07/2019 15:46:07 -------------------------------------------------------------------------------- Multi-Disciplinary Care Plan Details Patient Name: Date of Service: [name], [name] 10/07/2019 2:30 PM Medical Record [id number] Patient Account Number: [id number] Date of Birth/Sex: Treating RN: May 27, 1946 (73 y.o. [name] Primary Care Toby Breithaupt: [name] Other Clinician: Referring Tanyiah Laurich: Treating Soleil Mas/Extender:[name], [name], [name] in Treatment: 10 Active Inactive Wound/Skin Impairment Nursing Diagnoses: Knowledge deficit related to  ulceration/compromised skin integrity Goals: Patient/caregiver will verbalize understanding of skin care regimen Date Initiated: 07/29/2019 Target Resolution Date: 11/07/2019 Goal Status: Active Ulcer/skin breakdown will have a volume reduction of 30% by week 4 Date Initiated: 07/29/2019 Target Resolution Date: 10/10/2019 Goal Status: Active Interventions: Assess patient/caregiver ability to obtain necessary supplies Assess patient/caregiver ability to perform ulcer/skin care regimen upon admission and as needed Assess ulceration(s) every visit Notes: Electronic Signature(s) Signed: 10/07/2019 2:52:36 PM By: [name] RN Entered By: [name] on 10/07/2019 14:12:43 -------------------------------------------------------------------------------- Pain Assessment Details Patient Name: Date of Service: [name], [name] 10/07/2019 2:30 PM Medical Record [id number] Patient Account Number: [id number] Date of Birth/Sex: Treating RN: Jul 26, 1946 (73 y.o. [name] Primary Care Dorcus Riga: [name] Other Clinician: Referring Amalie Koran: Treating Caryle Helgeson/Extender:[name], [name], [name] in Treatment: 10 Active Problems Location of Pain Severity and Description of Pain Patient Has Paino No Site Locations Rate the pain. Current Pain Level: 0 Pain Management and Medication Current Pain Management: Medication: No Cold Application: No Rest: No Massage: No Activity: No T.E.N.S.: No Heat Application: No Leg drop or elevation: No Is the Current Pain Management Adequate: Adequate How does your wound impact your activities of daily livingo Sleep: No Bathing: No Appetite: No Relationship With Others: No Bladder Continence: No Emotions: No Bowel Continence: No Work: No Toileting: No Drive: No Dressing: No Hobbies: No Electronic Signature(s) Signed: 10/07/2019 5:32:49 PM By: Shawn Stall Entered By: Shawn Stall on 10/07/2019  14:41:32 -------------------------------------------------------------------------------- Patient/Caregiver Education Details Patient Name: Date of Service: Jeraldine Loots 12/8/2020andnbsp2:30 PM Medical Record [id number] Patient Account Number: [id number] Date of Birth/Gender: [birth year] (73 y.o. M) Treating RN: [name] Primary Care Physician: [name] Other Clinician: Referring Physician: Treating Physician/Extender:[name], [name], [name] in Treatment: 10 Education Assessment Education Provided To: Patient Education Topics Provided Wound/Skin Impairment: Methods: Explain/Verbal Responses: State content correctly Electronic Signature(s) Signed: 10/07/2019 2:52:36 PM By: [name] RN Entered By: [name] on 10/07/2019 14:15:47 -------------------------------------------------------------------------------- Wound Assessment Details Patient Name: Date of Service: [name], [name] 10/07/2019 2:30 PM Medical Record [id number] Patient Account Number: [id number] Date of Birth/Sex: Treating RN: 10/13/1946 (73 y.o. [name], Millard.Loa Primary Care Quay Simkin: [name] Other Clinician: Referring Keimya Briddell: Treating Coreon Simkins/Extender:[name], [name], [name] in Treatment: 10 Wound Status Wound Number: 5 Primary Diabetic Wound/Ulcer of the Lower Extremity Etiology: Wound Location: Left Metatarsal head fifth Wound Open Wounding Event: Gradually Appeared Status: Date Acquired: 07/14/2019 Comorbid Cataracts, Chronic sinus problems/congestion, Weeks Of Treatment: 10 History: Coronary Artery Disease, Hypertension, Clustered Wound: No Peripheral Arterial Disease, Type II Diabetes, Osteoarthritis, Neuropathy Photos Wound Measurements Length: (cm) 0.2 % Reducti Width: (cm) 0.3 % Reducti Depth: (cm) 0.1 Epithelia Area: (cm) 0.047 Tunnelin Volume: (cm) 0.005 Undermin  Wound Description Classification: Grade 1 Wound Margin:  Thickened Exudate Amount: Medium Exudate Type: Serosanguineous Exudate Color: red, brown Wound Bed Granulation Amount: Large (67-100%) Granulation Quality: Pink Necrotic Amount: None Present (0%) Foul Odor After Cleansing: No Slough/Fibrino No Exposed Structure Fascia Exposed: No Fat Layer (Subcutaneous Tissue) Exposed: Yes Tendon Exposed: No Muscle Exposed: No Joint Exposed: No Bone Exposed: No on in Area: 25.4% on in Volume: 61.5% lization: Large (67-100%) g: No ing: No Treatment Notes Wound #5 (Left Metatarsal head fifth) 1. Cleanse With Wound Cleanser Soap and water 2. Periwound Care Skin Prep 3. Primary Dressing Applied Hydrofera Blue 4. Secondary Dressing Dry Gauze 5. Secured With Secretary/administratorTape Electronic Signature(s) Signed: 10/08/2019 11:25:39 AM By: Benjaman KindlerJones, Dedrick EMT/HBOT Signed: 10/08/2019 12:05:21 PM By: Shawn Stalleaton, Bobbi Previous Signature: 10/07/2019 5:32:49 PM Version By: Shawn Stalleaton, Bobbi Entered By: Benjaman KindlerJones, Dedrick on 10/08/2019 11:20:05 -------------------------------------------------------------------------------- Vitals Details Patient Name: Date of Service: [name], [facility] 10/07/2019 2:30 PM Medical Record [id number] Patient Account Number: [id number] Date of Birth/Sex: Treating RN: 1946/03/11 23(73 y.o. Harlon FlorM) Deaton, Millard.LoaBobbi Primary Care Spyridon Hornstein: Selinda FlavinHoward, Kevin Other Clinician: Referring Starsha Morning: Treating Loyola Santino/Extender:[name], Peter CongoMichael Howard, Wadie LessenKevin Weeks in Treatment: 10 Vital Signs Time Taken: 14:30 Temperature (F): 98.8 Height (in): 69 Pulse (bpm): 70 Weight (lbs): 210 Respiratory Rate (breaths/min): 18 Body Mass Index (BMI): 31 Blood Pressure (mmHg): 132/75 Capillary Blood Glucose (mg/dl): 914100 Reference Range: 80 - 120 mg / dl Electronic Signature(s) Signed: 10/07/2019 5:32:49 PM By: Shawn Stalleaton, Bobbi Entered By: Shawn Stalleaton, Bobbi on 10/07/2019 14:41:17

## 2019-10-14 ENCOUNTER — Other Ambulatory Visit: Payer: Self-pay

## 2019-10-14 ENCOUNTER — Encounter (HOSPITAL_BASED_OUTPATIENT_CLINIC_OR_DEPARTMENT_OTHER): Payer: Medicare Other | Admitting: Internal Medicine

## 2019-10-14 ENCOUNTER — Encounter (INDEPENDENT_AMBULATORY_CARE_PROVIDER_SITE_OTHER): Payer: Self-pay | Admitting: Ophthalmology

## 2019-10-14 DIAGNOSIS — E11621 Type 2 diabetes mellitus with foot ulcer: Secondary | ICD-10-CM | POA: Diagnosis not present

## 2019-10-15 NOTE — Progress Notes (Signed)
LEAVY, HEATHERLY ([id number]) Visit Report for 10/14/2019 Debridement Details Patient Name: Date of Service: [name], [name] 10/14/2019 1:00 PM Medical Record [id number] Patient Account Number: [id number] Date of Birth/Sex: 1946/05/15 (73 y.o. M) Treating RN: Primary Care Provider: [name] Other Clinician: Referring Provider: Treating Provider/Extender:Tempest Frankland, [name], [name] in Treatment: 11 Debridement Performed for Wound #5 Left Metatarsal head fifth Assessment: Performed By: Physician Ricard Dillon., MD Debridement Type: Debridement Severity of Tissue Pre Fat layer exposed Debridement: Level of Consciousness (Pre- Awake and Alert procedure): Pre-procedure Verification/Time Out Taken: Yes - 14:06 Start Time: 14:06 Pain Control: Lidocaine 5% topical ointment Total Area Debrided (L x W): 0.4 (cm) x 0.5 (cm) = 0.2 (cm) Tissue and other material Viable, Non-Viable, Slough, Subcutaneous, Skin: Dermis , Skin: Epidermis, Slough debrided: Level: Skin/Subcutaneous Tissue Debridement Description: Excisional Instrument: Curette Bleeding: Moderate Hemostasis Achieved: Pressure End Time: 14:08 Procedural Pain: 0 Post Procedural Pain: 0 Response to Treatment: Procedure was tolerated well Level of Consciousness Awake and Alert (Post-procedure): Post Debridement Measurements of Total Wound Length: (cm) 0.6 Width: (cm) 0.7 Depth: (cm) 0.3 Volume: (cm) 0.099 Character of Wound/Ulcer Post Improved Debridement: Severity of Tissue Post Debridement: Fat layer exposed Post Procedure Diagnosis Same as Pre-procedure Electronic Signature(s) Signed: 10/14/2019 5:53:46 PM By: [name] MD Entered By: [name] on 10/14/2019 14:13:09 -------------------------------------------------------------------------------- HPI Details Patient Name: Date of Service: [name], [name] 10/14/2019 1:00 PM Medical Record [id number] Patient Account Number:  [id number] Date of Birth/Sex: Treating RN: [birth year] (73 y.o. M) Primary Care Provider: [name] Other Clinician: Referring Provider: Treating Provider/Extender:Braxon Suder, [name], [name] in Treatment: 11 History of Present Illness HPI Description: 05/18/16; this is a 73year-old diabetic who is a type II diabetic on insulin. The history is that he traumatized his right foot developed a sore sometime in late March. 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 According to the patient and his wife this wound never really healed. He was seen on 2 occasions in the wound care center in [place] and had vascular studies and then was referred urgently to Dr. Bridgett Larsson of vascular surgery. He underwent an angiogram on 05/10/16. Unfortunately nothing really could be done to improve his vascular status. He had a 75-90% stenosis in the midsegment of 1 segment of the posterior femoral artery. He had a patent popliteal, his anterior tibial occluded shortly after takeoff. Perineal had a greater than 90% stenosis posterior tibial is occluded feet had no distal collaterals feed distal aspect of the transmetatarsal amputation site. The patient tells me that he had a prolonged period of Santyl by Dr. Doran Durand was some initial improvement but then this was stopped. I think they're only applying daily dressings/dry dressings. He has not had a recent x-ray of the right foot he did have one before his surgery in April. His wife by the dimensions of the wound/surgical site being followed at home since 4/20. At that point the dimensions were 0.5 x 12 x 0.2 on 7/19 this was 1.8 x 6 x 0.4. He is not currently on any antibiotics. His hemoglobin A1c in early April was 12.9 at that point he was  started on insulin. Apparently his blood sugars are much lower he has an appointment with Dr. Legrand Como Alteimer of endocrine next week. 05/29/16 x-ray of the  area did not show osteomyelitis. I think he probably needs an MRI at this point. His wife is asking about something called"Yireh" cream which is not FDA approved. I have not heard of this. 06/22/16; MRI did not really suggest osteomyelitis. There was minimal marrow edema and enhancement in the stump of the second metatarsal felt to be secondary likely to postoperative change rather than osteomyelitis. The patient has arranged his own consultation with Dr. Andree Elk at [place], apparently their daughter lives in [place] and has some connection here. Any improvement in vascular supply by Dr. Andree Elk would of course be helpful. Dr. Bridgett Larsson did not feel that anything further could be done other than amputation if wound care did not result in healing or if the area deteriorates. 06/26/16; the patient has been to see Dr. Andree Elk at [place] and had an angiogram. He is going for a procedure on Thursday which will involve catheterization. I'm not sure if this is an anterograde or retrograde approach. He has been using Santyl to the wound 07/10/16; the patient had a repeat angiogram and angioplasty at [place] by Dr. Brunetta Jeans. His angiogram showed right CFA and profundal widely patent. The right as of a.m. popliteal artery were widely patent the right anterior tibial was occluded proximally and reconstitutes at the ankle. Peroneal artery was patent to the foot. Posterior tibial artery was occluded. The patient had angioplasty of the anterior tibial artery.. This was quite successful. He was recommended for Plavix as well as aspirin. 07/17/16; the patient was close to be a nurse visit today however the outer dressing of the Apligraf fell off. Noted drainage. I was asked to see the wound. The patient is noted an odor however his wife had noted that. Drainage with Apligraf not  necessarily a bad thing. He has not been systemically unwell 07/24/16; we are still have an issue with drainage of this wound. In spite of this I applied his second Apligraf. Medially the area still is probing to bone. 08/07/16; Apligraf reapplied in general wound looks improved. 08/21/16 Apligraf #4. Wound looks much better 09/04/16 patientt's wound again today continues to appear to improve with the application of the Apligraf's. He notes no increased discomfort or concerns at this point in time. 09/18/16; the patient returns today 2 weeks after his fifth application of Apligraf. Predictably three quarters of the width of this wound has healed. The deep area that probe to bone medially is still open. The patient asked how much out-of-pocket dollars would be for additional Apligraf's. 09/25/16; now using Hydrofera Blue. He has completed 5 Apligraf applications with considerable improvement in this deep open transmetatarsal amputation site. His wound is now a triangular-shaped wound on the medial aspect. At roughly 12 to 2:00 this probes another centimeter but as opposed to in the past this does not probe to bone. The patient has been seen at [place] by Dr. Zenia Resides. He is not planning to do any more revascularization unless the wound stalls or worsens per the patient 10/02/16; 0.7 x 0.8 x 0.8. Unfortunately although the wound looks stable to improved. There is now easily probable bone. This hasn't been present for several weeks. Patient is not otherwise symptomatic he is not experiencing any pain. I did a culture of the wound bed 10/09/16. Deterioration last week. Culture grew MRSA and although there is improvement here with doxycycline prescribed over the phone I'm going to try to get him linezolid 600 twice a day for 10 days today. 10/16/16; he is completing a weeks worth  of linezolid and still has 3 more days to go. Small triangular-shaped open area with some degree of undermining. There is still  palpable bone with a curet. Overall the area appears better than last week 10/20/16 he has completed the linezolid still having some nausea and vomiting but no diarrhea. He has exposed bone this week which is a deterioration. 10/27/16 patient now has a small but probing wound down to bone. Culture of this bone that I did last week showed a few methicillin-resistant staph aureus. I have little doubt that this represents acute/subacute osteomyelitis. The patient is currently on Doxy which I will continue he also completed 10 days of linezolid. We are now in a difficult situation with this patient's foot after considerable discussion we will send him back to see Dr. Doran Durand for a surgical opinion of this I'm also going to try to arrange a infectious disease consult at [facility] hopefully week and get this prior to her usual 4-6 weeks we having [place]. The patient clearly is going to need 6 weeks of IV vancomycin. If we cannot arrange this expediently I'll have to consider ordering this myself through a home infusion company 11/03/16; the patient now has a small in terms of circumference but probing wound. No bone palpable today. The patient remains on doxycycline 100 twice a day which should support him until he sees infectious disease at [facility] next week the following week on Wednesday I believe he has an appointment with Dr. Andree Elk at [facility] who is his vascular cardiologist. Finally he has an appointment with Dr. Doran Durand on 11/22/16 we have been using silver alginate. The patient's wife states they are having trouble getting this through [facility] 11/13/16; the patient was seen by infectious disease at [facility] in the 11th PICC line placed in preparation for IV antibiotics. A tummy he has not going to get IV vancomycin o Ceftaroline. They've also ordered an MRI. Patient has a follow-up with Dr. Doran Durand on 11/22/16 and Dr. Andree Elk at [facility] tomorrow 11/23/16 the patient is on daptomycin as directed by  infectious disease at [facility]. He is also been back to see Dr. Andree Elk at [facility]. He underwent a repeat arteriogram. He had a successful PTA of the right anterior tibial artery. He is on dual antiplatelete treatment with Plavix and aspirin. Finally he had the MRI of his foot in [place]. This showed cellulitis about the foot worse distally edema and enhancement in the reminiscent of the second metatarsal was consistent with osteomyelitis therefore what I was assuming to be the first metatarsal may be actually the second. He also has a fluid collection deep to the calcaneus at the level of the calcaneal spur which could be an abscess or due to adventitial bursitis. He had a small tear in his Achilles 11/30/16; the patient continues on daptomycin as directed by infectious disease at [facility]. He is been revascularized by Dr. Andree Elk at [facility] in [place]. He has been to see Gretta Arab who was the orthopedic surgeon who did his original amputation. I have not seen his not however per the patient's wife he did not offer another surgical local surgical prodecure to remove involved bone. He verbalized his usual disbelief in not just hyperbarics but any medical therapy for this condition(osteomyelitis). I discussed this in detail with the patient today including answering the question about a BKA definitively "curing" the current condition. 12/07/16; the patient continues on daptomycin as directed by infectious disease at [facility]. This is directed at the MRSA  that we cultured from his bone debridement from 12/22. Lab work today shows a white count of 8.7 hemoglobin of 10.5 which is microcytic and hypochromic differential count shows a slightly elevated monocyte count at 1.2 eosinophilic count of 0.6. His creatinine is 1.11 sedimentation rate apparently is gone from 35-34 now 40. I explained was wife I don't think this represents a trend. His total CK is 48 12/14/16- patient is here for follow-up  evaluation of his right TMA site. He continues to receive IV daptomycin per infectious disease. His serum inflammatory markers remain elevated. He complains of intermittent pain to the medial aspect of the TMA site with intermittent erythema. He voices no complaints or concerns regarding hyperbaric therapy. Overall he and his wife are expressing a frustration and discouragement regarrding the length of time of treatment. 12/21/16; small open wound at roughly the first or second metatarsal metatarsalphalyngeal joint reminiscence of his transmetatarsal amputation site he continues to receive IV daptomycin per infectious disease at [facility]. He will finish these a week tomorrow. He has lab work which I been copied on. His white count is 10.8 hemoglobin 8.9 MCV is low at 75., MCH low at 24.5 platelet count slightly elevated at 626. Differential count shows 70% neutrophils 10% monocytes and 10% eosinophils. His comprehensive metabolic panel shows a slightly low sodium at 133 albumin low at 3.1 total CK is normal at 42 sedimentation rate is much higher at 82. He has had iron studies that show a serum iron of 15 and iron binding capacity of 238 and iron saturation of 6. This is suggestive of iron deficiency. B12 and folate were normal ferritin at 113 The patient tells me that he is not eating well and he has lost weight. He feels episodically nauseated. He is coughing and gagging on mucus which she thinks is sinusitis. He has an appointment with his primary doctor at 5:00 this afternoon in [facility] 01/02/17; the patient developed a subacute pneumonitis. He was admitted to [facility] after a CT scan showed an extensive interstitial pneumonitis [I have not yet seen this]. 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 He has since been discharged. He is not on oxygen. He feels fatigued and very short of  breath with exertion. At [facility] the wound care nurse there felt that his wound was healed. He did complete his daptomycin and has follow-up with infectious disease on Friday. X-rays I did before he went to [facility] still suggested residual osteomyelitis in the anterior aspect of the second must metatarsal head. I'm not sure I would've expected any different. There was no other findings. I actually think I did this because of erythema over the first metatarsal head reminiscent 01/11/17; the patient has eosinophilic pneumonitis. He has been reviewed by pulmonology and given clearance for hyperbarics at least that's what his wife says. I'll need to see if there is note in care everywhere. Apparently the prognosis for improvement of daptomycin induced eosinophilic granulocyte is is 3 months without steroids. In the meantime infectious disease has placed him on doxycycline until the wound is closed. He is still is lost a lot of weight and his blood sugars are running in the mid 60s to low 80s fasting and at all times during the day 01/18/17; he has had adjustments in his insulin apparently his blood sugars in the morning or over 100. He wants to restart his hyperbaric treatment we'll do this at  1:00. 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 I think there is good reason to complete his treatments in order to give him the best chance of maintaining a healed status and these DFU 3 wounds with MRSA infection in the bone 02/15/17; the patient was seen today in conjunction with HBO. He completed hyperbaric oxygen today. The open area on his transmetatarsal site has remained closed. There was an area of erythema when I saw him earlier in the week on the posterior heel although that is resolved as of today as well. He has been using a cam walker. This is a patient who came to [place] after a transmetatarsal amputation that was  necrotic and dehisced. He required revascularization percutaneously on 2 different occasions by Dr. Andree Elk of invasive cardiology at [facility]. 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 He went to [facility] infectious disease and had IV daptomycin for 5 weeks before developing eosinophilic pneumonitis and requiring an admission to hospital/ICU. He made a good recovery and is continued on doxycycline since. As mentioned his foot is closed now. He has a follow-up with Dr. Andree Elk tomorrow. He is going to [employer] on Monday for a custom-made shoe READMISSION Last visit Dr. Andree Elk [facility] 02/16/17 1. Critical limb ischemia of the RLE: s/p transmetatarsal amputation of the RLE, now completely healed. s/p right ATA percutaneous revascularization procedure x2, most recently 11/20/2016 s/p PTA of right AT 100% to less than 20% with a 2.5 x 200 balloon. He does have some residual osteomyelitis, but given the wound is closed, the orthopedist has recommended to follow. He has a fitting for a special shoe coming up next week. He has completed a course of daptomycin and doxycycline and he has been discharged by ID. He has completed a course of hyperbaric therapy. Continue Continue medical management with aspirin, Plavix and statin therapy. We will discuss ongoing Plavix therapy at follow-up in 6 months. 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 Given no symptoms, we will conservatively manage. He doesn't want to do any more invasive studies at this time, which is reasonable. The patient arrives today out of 2 concerns both on the right transmetatarsal site. 1 at the level of the reminiscent fifth metatarsal head and the other at roughly the first or second. Both of these look like dark  subcutaneous discoloration probably subdermal bleeding. He is recently obtained new adaptive footwear for the right foot. He also has a callus on the left fifth dorsal toe however he follows with podiatry for this and I don't think this is any issue. ABIs in this clinic today were 0.66 on the right and 1.06 on the left. On entrance into our clinic initially this was 0.95 and 0.92. He does not describe current claudication. They're going away on a cruise in 8 weeks and I think are trying to do the month is much as they can proactively. They follow with podiatry and have an appointment with Dr. Andree Elk in October READMISSION 06/25/18 This is a patient that we have not seen in almost a year. He is a type II diabetic with known PAD. He is followed by Dr. Andree Elk of interventional cardiology at [facility] in [place]. He is required revascularization for significant PAD. When we  first saw him he required a transmetatarsal amputation. He had underlying osteomyelitis with a nonhealing surgical wound. This eventually closed with wound care, IV antibiotics and hyperbaric oxygen. They tell me that he has a modified shoe and he is very active walking up to 4 miles a day. He is followed by Dr. Geroge Baseman of podiatry. His wife states that he underwent a removal of callus over this site on July 9. She felt there may be drainage from this site after that although she could never really determined and there was callus buildup again. On 06/01/18 there was pressure bleeding through the overlying callus. he was given a prescription for 7 days of Bactrim. Fortuitously he has an appointment with Dr. Andree Elk on 06/04/18 and he immediately underwent revascularization of the right leg although I have not had a chance to review these records in care everywhere. This was apparently done through anterior and retrograde access. On 06/06/18 he had another debridement by Dr. Bernette Mayers. Vitamin soaking with Epsom salts for 20 minutes and  applying calcium alginate. The original trans-met was on April 2017 I believe by Dr. Doran Durand. His ABI in our clinic was noncompressible today. 07/02/18; x-ray I ordered last week was negative for osteomyelitis. Swab culture was also negative. He is going to require an MRI which I have ordered today. 07/09/18; surprisingly the MRI of the foot that I ordered did not show osteomyelitis. He did suggest the possibility of cellulitis. For this reason I'll go ahead and give him a 10 day course of doxycycline. Although the previous culture of this area was negative 07/16/18; it arrives with the wound looking much the same. Roughly the same depth. He has thick subcutaneous tissue around the wound orifice but this still has roughly the same depth. Been using silver alginate. We applied Oasis #1 today 07/23/2018; still having to remove a lot of callus and thick subcutaneous tissue to actually define the wound here. Most of this seems to have closed down yet he has a comma shaped divot over the top of the area that still I think is open. We applied Oasis #2 His wife expressed concern about the tip of his left great toe. This almost looks like a small blister. She also showed it today to her podiatrist Dr. Geroge Baseman who did not think this was anything serious. I am not sure is anything serious either however I think it bears some watching. He is not in any pain however he is insensate 07/30/2018;; still on a lot of nonviable tissue over the surface of the wound however cleaning this up reveals a more substantial wound orifice but less of a probing wound depth. This is not probed to bone. There is no evidence of infection The wife is still concerned about a non-open area on the tip of his left great toe. Almost feels like a bony outgrowth. She had previously showed this to podiatry. I do not think this is a blister. A friction area would be possible although he is really not walking according to his wife. He had a  small skin tag on his right buttock but no open wound here either 08/06/2018; we applied a third Oasis last week. Unfortunately there is really no improvement. Still requiring extensive debridement to expose the wound bed from a horizontal slitlike depression. I still have not been able to get this to fill in properly. On the positive side there is now no probable bone from when he first came into the facility. I changed him  to silver alginate today after a reasonably aggressive debridement 08/13/2018; once again the patient comes in with skin and subcutaneous tissue closing over the small probing area with the underlying cavity of the wound on the right TMA site. I applied silver alginate to this last week. Prior to that we used Oasis x3 still not able to get this area granulating. He does not have a probing area of the bone which is an improvement from when I spur started working on this and an MRI did not suggest osteomyelitis. I do not see evidence of infection here but I am increasingly concerned about why I cannot get this area to granulate. Each time I debrided this is looks like this is simply a matter of getting granulation to fill in the hole and then getting epithelialization. This does not seem to happen Also I sent him back to see podiatry Dr. Earleen Newport about the what felt to be bony outgrowth on the tip of his left great toe. Apparently after heel he left the clinic last week or the next day he developed a blood blister. He did see Dr. Earleen Newport. He went on to have a debridement of the medial nail cuticle he now has an open area here as well as some denuded skin. They did an x-ray apparently does have a bony outgrowth or spur but I am not able to look at this. They are using topical antibiotics apparently there was some suggested he use Santyl. Patient's wife was anxious for my opinion of this 08/20/2018; we are able to keep the wound open this time instead of the thick subcutaneous tissue  closing over the top of it however unfortunately once again this probes to bone. I do not see any evidence of infection and previous MRI did not show osteomyelitis. I elected to go back to the Oasis to see if we can stimulate some granulation. Last saw his vascular interventional cardiologist Dr. Andree Elk at the beginning of August and he had a repeat procedure. Nevertheless I wonder how much blood flow he has down to this area With regards to the left first toe he is seeing Dr. Earleen Newport next week. They are applying Bactroban to this area. 08/27/2018 Once again he comes in with thick eschar and subcutaneous tissue over the top of the small probing hole. This does not appear to go down to bone but it still has roughly the same depth. I reapplied Oasis today Over the left great toe there appears to be more of the wound at the tip of his toe than there was last week. This has an eschar on the surface of it. Will change to Santyl. There is seeing podiatry this afternoon 09/03/2018 He comes in today with the area on the transmetatarsal's site with a fair amount of callus, nonviable tissue over the circumference but it was not closed. I removed all of this as well as some subcutaneous debris and reapplied Oasis. There is no exposed bone The area over the tip of the left great toe started off as a nodule of uncertain etiology. He has been followed with podiatry. They have been removing part of the medial nail bed. He has nonviable tissue over the wound he been using Santyl in this area 09/10/18 Unfortunately comes in with neither wound area looking improved. The transmetatarsal amputation site once Again has nonviable debris over the surface requiring debridement. Unfortunately underneath this there is nothing that looks viable and this once again goes right down to bone. There is  no purulent drainage and no erythema. Also the surgical wound from podiatry on the left first toe has an ischemic-looking eschar  over the surface of the tip of the toe I have elected not to attempt candidly debride this His wife as arranged for him to have follow-up noninvasive studies in [place] under the care of Dr. Andree Elk clinic. The question is he has known severe PAD. They had recently seen him in August. He has had revascularizations in both legs within the last 4 or 5 months. He is not complaining of pain and I cannot really get a history of claudication. He has not systemically unwell 09/20/2018 the patient has been to [facility] and been revascularized by Dr. Andree Elk earlier this week. Apparently he was able to open up the anterior tibial artery although I have not actually seen his formal report. He is going for an attempt to revascularize on the left on Monday. He is apparently working with an investigational stent for lower extremity arteries below the knee and he has talked to the patient about placing that on Monday if possible. The patient has been using silver alginate on the transmetatarsal amputation site and Santyl on the left 09/30/2018; patient had his revascularization on the left this apparently included a standard approach as well as a more distal arterial catheterization although I do not have any information on this from Dr. Andree Elk. In fact I do not even see the initial revascularization that he had on the right. I have included the arterial history from Dr. Andree Elk last note however below; ASSESSMENT/PLAN: 1. Hx of Critical limb ischemia bilateral lower extremities, PAD: -s/p transmetatarsal amputation of the RLE. -s/p right ATA percutaneous revascularization procedure x2, most recently 11/20/2016 s/p PTA of right AT 100% to less than 20% with a 2.5 x 200 balloon. 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 -03/21/2018 s/p PTA of 90% left popliteal artery to <10% with a  5x20 cutting balloon, PTA of 90% left peroneal to <20% with a 3x20 balloon, PTA of 100% left AT to <20% with a 2.5x220 balloon. 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 Will plan on the LLE soon after. Risks/benefits of procedure have been discussed and patient has elected to proceed. We have been using endoform to the right TMA amputation site wound and Santyl to the left great toe 10/07/2018; the area on the tip of his left great toe looked better we have been using Santyl here. We continue to have a very difficult probing hole on the right TMA amputation site we have been using endoform. I went on to use his fifth Oasis today 10/14/2018; the tip of the left great toe continues to look better. We have been using Santyl here the surface however is healthy and I think we can change to an alginate. The right TMA has not changed. Once again he has no superficial opening there is callus and thick subcutaneous tissue over the orifice once you remove this there is the probing area that we have been dealing with without too much change. There is no palpable bone I have been placing Oasis here and put Oasis #6 in this today after a more vigorous debridement 10/21/18; the left great toe still has necrotic surface  requiring debridement. The right TMA site hasn't changed in view of the thick callus over the wound bed. With removal of this there is still the opening however this does not appear to have the same depth. Again there is no palpable bone. Oasis was replaced 10/28/2018; patient comes in with both wounds looking worse. The area over the first toe tip is now down to bone. The area over the TMA site is deeper and down to bone clearly with a increase in overall wound area. Equally concerning on the right TMA is the complete absence of a pulse this week  which is a change. We are not even able to Doppler this. On the right he has a noncompressible ABI greater than 1.4 11/04/2018. Both wounds look somewhat worse. X-rays showed no osteomyelitis of the right foot but on the left there was underlying osteomyelitis in the left great toe distal phalanx. I been on the phone to Dr. Andree Elk surface at [facility] in [place] and they are arranging for another angiogram on the right on Monday. I have him on doxycycline for the osteomyelitis in the left great toe for now. Infectious disease may be necessary 1/17; 2-week hiatus. Patient was admitted to hospital at [facility]. My understanding is he underwent an angioplasty of the right anterior tibial artery and had stents placed in the left anterior artery and the left tibial peroneal trunk. This was done by Dr. Andree Elk of interventional radiology. There is no major change in either 1 of the wounds. They have been using Aquacel Ag. As far as they are aware no imaging studies were done of the foot which is indeed unfortunate. I had him on doxycycline for 2 weeks since we identified the osteomyelitis in the left great toe by plain x-ray. I have renewed that again today. I am still suspicious about osteomyelitis in the amputation site and would consider doing another MRI to compare with the one done in September. The idea of hyperbaric oxygen certainly comes up for discussion 1/24; no major change in either wound area. I have him on doxycycline for osteomyelitis at the tip of the left great toe. As noted he has been previously and recently revascularized by Dr. Andree Elk at [place]. He is tolerating the doxycycline well. For some reason we do not have an infectious disease consult yet. Culture of drainage from the right foot site last week was negative 1/31; MRI of the right foot did not show osteomyelitis of the right ankle and foot. Notable for a skin ulceration overlying the second metatarsal stump with generalizing soft tissue  edema of the ankle and foot consistent with cellulitis. Noted to have a partial-thickness tear of the Achilles tendon 7.5 cm proximal to the insertion Nothing really new in terms of symptoms. Patient's area on the tip of the left great toe is just about closed although he still has the probing area on the metatarsal amputation site. Appointment with Dr. Linus Salmons of infectious disease next week 2/7; Dr. Novella Olive did not feel that any further antibiotics were necessary he would follow-up in 2 months. The left great toe appears to be closed still some surface callus that I gently looked under high did not see anything open or anything that was threatening to be open. He still has the open area on the mid part of his TMA site using endoform 2/14; left great toe is closed and he is completing his doxycycline as of last Sunday. He is not on any antibiotics. Unfortunately out of the  right foot his wife noticed some subdermal hemorrhage this week. He had been walking 2 miles I had given him permission to do so this is not really a plantar wound. Using endoform to this wound but I changed to silver alginate this week 2/21; left great toe remains closed. Culture last week grew Streptococcus angiosis which I am not really familiar with however it is penicillin sensitive and I am going to put him on Augmentin. Not much change in the wound on the right foot the deep area is still probing precariously close to bone and the wound on the margin of the TMA is larger. 2/28; left great toe remains closed. He is completing the Augmentin I gave him last week. 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 This is being shown to Dr. Andree Elk at [place] to see if there is anything else that can be done here. He has been using silver alginate strips on the right 3/6; left great toe remains closed. He sees Dr. Andree Elk on Monday. The area on the plantar aspect of the right foot has the same small orifice  with thick callused tissue around this. However this time with removal of the callus tissue the wound is open all the way along the incision line to the end medially. We have been using silver alginate. 3/13; left toe remains closed although the area is callused. He sees Dr. Jacqualyn Posey of podiatry next week. The area on the plantar right foot looked a lot better this week. Culture I did of this was negative we use silver alginate. He is going next week for an attempt at revascularization by Dr. Andree Elk 3/23; left toe remains closed although the area is callused. He will see Dr. Jacqualyn Posey in follow-up. The area on the right plantar foot continues to look surprisingly better over the last 3 visits. We have been using silver alginate. The revascularization he was supposed to have by Dr. Andree Elk at [facility] in [place] has been canceled [facility] procedure] 4/6; the right foot remains closed albeit callused. Podiatry canceled the appointment with regards to the left great toe. He has not seen Dr. Andree Elk at [facility] but thinks that Dr. Andree Elk has "done all he can do". He would be a candidate for the hemostaemix trial Readmission 07/29/2019 [name] is a man we know well from at least 3 previous stays in this clinic. He is a type II diabetic with severe PAD followed by Dr. Andree Elk at [facility] in [place]. 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 So far everything in both of these areas has remained closed. About 2 weeks ago he went to see [facility] podiatrist [facility]. Babs Bertin. He had a thick callus on the left fifth metatarsal head that was shaved. He has developed an open wound in this area. They have been offloading this in his diabetic shoes. The patient has not had any more revascularizations by Dr. Andree Elk since the last time he was here. ABI in our clinic at the  posterior tibial on the left was 1.06 10/6; no real change in the area on the plantar met head. Small wound with 2 mm of depth. He has thick skin probably from pressure around the wound. We have been using silver alginate 10/13; small wound in the left fifth plantar met head.  Arrives today with undermining laterally and purulent drainage. Our intake nurse cultured this. His wife stated they noticed a change in color over the last day or 2. He is not systemically unwell 10/19; small wound on the fifth plantar metatarsal head. Culture I did last week showed Staphylococcus lugdunensis. Although this could be a skin contaminant the possibility of a skin and soft tissue infection was there. I did give him empiric doxycycline which should have covered this. We are using silver alginate to the wound. We put him in a total contact cast today. 10/22; small wound on the fifth plantar metatarsal head. He has completed antibiotics. He also has severe PAD which worries me about just about any wound on this man's foot 10/29; small superficial area on the fifth plantar metatarsal head. Measuring slightly smaller. He is not currently on any antibiotics. I been using a total contact cast in this man with very severe PAD but he seems to be tolerating this well 11/5; left plantar fifth metatarsal head. Silver alginate being used under a total contact cast 11/12; wound not much different than last week. Using a #15 scalpel debridement around the wound. Still silver collagen under a total contact cast. He has severe PAD and that may be playing a role in this 11/19; disappointing that the wound is not really changed that much. I think it is come down in overall surface area because originally this was on the lateral part of the fifth metatarsal head however recently it is not really changed. Most of this is filled in but it will not epithelialized there is surface debris on this which may be mostly related  to ischemia. They have an appointment with Dr. Andree Elk on 12/9 09/24/2019 on evaluation today patient appears to be doing somewhat better with regard to the wound on the left fifth metatarsal head. Fortunately there does not appear to be any signs of active infection at this time. No fevers, chills, nausea, vomiting, or diarrhea. The wound does not appear to be completely closed and I do feel like the Hydrofera Blue is helping to some degree although I feel like the cast as well as keeping things from breaking down or getting any larger at least. As far as his wound overview it shows that the overall size of the wound is slightly smaller but really maintaining within the realm of about the same. He had no troubles with the cast which is good news. 12/1; left fifth metatarsal head. Perhaps somewhat more vibrant. We have been using Hydrofera Blue. He sees Dr. Andree Elk at [place] 1 week tomorrow. He be back next week and I hope to have a better idea which way the wound is going however I will not cast him next week in case Dr. Andree Elk wants to reevaluate things in his foot. The left leg is the leg with the stent in place and I wonder whether these are going to have to be reevaluated. 12/8; left fifth metatarsal head. Quite a bit better this week. Only a small open area remains. He sees Dr. Andree Elk at [place] tomorrow. Dr. Andree Elk is previously done his vascular interventions. He has 2 stents in the left leg as I remember things. I therefore will not put him in a total contact cast today. He will be using a forefoot off loader. We will use silver collagen on the wound. We will see him again next week 12/15; left fifth metatarsal head. He saw Dr. Andree Elk and is scheduled for an angiogram on  Thursday. Unfortunately although his wound was a lot better last week it is really deteriorated this week. Small punched-out hole with depth and overhanging tissue. We have been using Hydrofera Blue Electronic Signature(s) Signed:  10/14/2019 5:53:46 PM By: [name] MD Entered By: [name] on 10/14/2019 14:14:11 -------------------------------------------------------------------------------- Physical Exam Details Patient Name: Date of Service: [name], [name] 10/14/2019 1:00 PM Medical Record [id number] Patient Account Number: [id number] Date of Birth/Sex: Treating RN: 09-16-1946 (73 y.o. M) Primary Care Provider: [name] Other Clinician: Referring Provider: Treating Provider/Extender:Shannin Naab, [name], [name] in Treatment: 11 Constitutional Sitting or standing Blood Pressure is within target range for patient.. Pulse regular and within target range for patient.Marland Kitchen Respirations regular, non-labored and within target range.. Temperature is normal and within the target range for the patient.Marland Kitchen Appears in no distress. Cardiovascular Pedal pulses are not palpable. Notes Wound exam; using a #3 curette to remove the overhanging skin and some necrotic debris from the wound circumference. The wound is a lot larger. Hemostasis with direct pressure. No overt infection Electronic Signature(s) Signed: 10/14/2019 5:53:46 PM By: [name] MD Entered By: [name] on 10/14/2019 14:15:11 -------------------------------------------------------------------------------- Physician Orders Details Patient Name: Date of Service: [name], [name] 10/14/2019 1:00 PM Medical Record [id number] Patient Account Number: [id number] Date of Birth/Sex: Treating RN: 01/03/1946 (73 y.o. [name]) [name] Primary Care Provider: [name] Other Clinician: Referring Provider: Treating Provider/Extender:Melbourne Jakubiak, [name], [name] in Treatment: 11 Verbal / Phone Orders: No Diagnosis Coding ICD-10 Coding Code Description E11.621 Type 2 diabetes mellitus with foot ulcer E11.51 Type 2 diabetes mellitus with diabetic peripheral angiopathy without gangrene L97.521 Non-pressure chronic  ulcer of other part of left foot limited to breakdown of skin L03.116 Cellulitis of left lower limb Follow-up Appointments Return Appointment in 1 week. Dressing Change Frequency Wound #5 Left Metatarsal head fifth Change Dressing every other day. Wound Cleansing Wound #5 Left Metatarsal head fifth May shower and wash wound with soap and water. Primary Wound Dressing Wound #5 Left Metatarsal head fifth Hydrofera Blue - READY Secondary Dressing Wound #5 Left Metatarsal head fifth Kerlix/Rolled Gauze - secure with tape Dry Gauze Electronic Signature(s) Signed: 10/14/2019 5:53:46 PM By: [name] MD Signed: 10/15/2019 9:49:12 AM By: [name] RN Entered By: [name] on 10/14/2019 13:21:40 -------------------------------------------------------------------------------- Problem List Details Patient Name: Date of Service: [name], [name] 10/14/2019 1:00 PM Medical Record [id number] Patient Account Number: [id number] Date of Birth/Sex: Treating RN: 1946-10-14 (73 y.o. Staci Acosta, Morey Hummingbird Primary Care Provider: [name] Other Clinician: Referring Provider: Treating Provider/Extender:Helena Sardo, [name], [name] in Treatment: 11 Active Problems ICD-10 Evaluated Encounter Code Description Active Date Today Diagnosis E11.621 Type 2 diabetes mellitus with foot ulcer 07/29/2019 No Yes E11.51 Type 2 diabetes mellitus with diabetic peripheral 07/29/2019 No Yes angiopathy without gangrene L97.521 Non-pressure chronic ulcer of other part of left foot 07/29/2019 No Yes limited to breakdown of skin L03.116 Cellulitis of left lower limb 08/12/2019 No Yes Inactive Problems Resolved Problems Electronic Signature(s) Signed: 10/14/2019 5:53:46 PM By: [name] MD Entered By: [name] on 10/14/2019 14:12:41 -------------------------------------------------------------------------------- Progress Note Details Patient Name: Date of Service: [name], [name]  10/14/2019 1:00 PM Medical Record [id number] Patient Account Number: [id number] Date of Birth/Sex: Treating RN: Jul 04, 1946 (73 y.o. M) Primary Care Provider: [name] Other Clinician: Referring Provider: Treating Provider/Extender:Bobbiejo Ishikawa, [name], [name] in Treatment: 11 Subjective History of Present Illness (HPI) 05/18/16; this is a 73year-old diabetic who is a type II diabetic on insulin. The history is that he traumatized his right  foot developed a sore sometime in late March. Shortly thereafter he went on a cruise but he had to get off the cruise ship in [place] and fly urgently back to [place] where he was admitted to [facility] and ultimately underwent a transmetatarsal amputation by Dr. 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 According to the patient and his wife this wound never really healed. He was seen on 2 occasions in the wound care center in [place] and had vascular studies and then was referred urgently to Dr. Bridgett Larsson of vascular surgery. He underwent an angiogram on 05/10/16. Unfortunately nothing really could be done to improve his vascular status. He had a 75-90% stenosis in the midsegment of 1 segment of the posterior femoral artery. He had a patent popliteal, his anterior tibial occluded shortly after takeoff. Perineal had a greater than 90% stenosis posterior tibial is occluded feet had no distal collaterals feed distal aspect of the transmetatarsal amputation site. The patient tells me that he had a prolonged period of Santyl by Dr. Doran Durand was some initial improvement but then this was stopped. I think they're only applying daily dressings/dry dressings. He has not had a recent x-ray of the right foot he did have one before his surgery in April. His wife by the dimensions of the wound/surgical site being followed at home since 4/20. At that point the dimensions were 0.5 x 12 x 0.2 on 7/19 this was 1.8 x 6 x 0.4. He is not currently on any  antibiotics. His hemoglobin A1c in early April was 12.9 at that point he was started on insulin. Apparently his blood sugars are much lower he has an appointment with Dr. Legrand Como Alteimer of endocrine next week. 05/29/16 x-ray of the area did not show osteomyelitis. I think he probably needs an MRI at this point. His wife is asking about something called"Yireh" cream which is not FDA approved. I have not heard of this. 06/22/16; MRI did not really suggest osteomyelitis. There was minimal marrow edema and enhancement in the stump of the second metatarsal felt to be secondary likely to postoperative change rather than osteomyelitis. The patient has arranged his own consultation with Dr. Andree Elk at [place], apparently their daughter lives in [place] and has some connection here. Any improvement in vascular supply by Dr. Andree Elk would of course be helpful. Dr. Bridgett Larsson did not feel that anything further could be done other than amputation if wound care did not result in healing or if the area deteriorates. 06/26/16; the patient has been to see Dr. Andree Elk at [place] and had an angiogram. He is going for a procedure on Thursday which will involve catheterization. I'm not sure if this is an anterograde or retrograde approach. He has been using Santyl to the wound 07/10/16; the patient had a repeat angiogram and angioplasty at [place] by Dr. Brunetta Jeans. His angiogram showed right CFA and profundal widely patent. The right as of a.m. popliteal artery were widely patent the right anterior tibial was occluded proximally and reconstitutes at the ankle. Peroneal artery was patent to the foot. Posterior tibial artery was occluded. The patient had angioplasty of the anterior tibial artery.. This was quite successful. He was recommended for Plavix as well as aspirin. 07/17/16; the patient was close to be a nurse visit today however the outer dressing of the Apligraf fell off. Noted drainage. I was asked to see the wound. The patient is  noted an odor however his wife had noted that. Drainage with Apligraf not necessarily  a bad thing. He has not been systemically unwell 07/24/16; we are still have an issue with drainage of this wound. In spite of this I applied his second Apligraf. Medially the area still is probing to bone. 08/07/16; Apligraf reapplied in general wound looks improved. 08/21/16 Apligraf #4. Wound looks much better 09/04/16 patientt's wound again today continues to appear to improve with the application of the Apligraf's. He notes no increased discomfort or concerns at this point in time. 09/18/16; the patient returns today 2 weeks after his fifth application of Apligraf. Predictably three quarters of the width of this wound has healed. The deep area that probe to bone medially is still open. The patient asked how much out-of-pocket dollars would be for additional Apligraf's. 09/25/16; now using Hydrofera Blue. He has completed 5 Apligraf applications with considerable improvement in this deep open transmetatarsal amputation site. His wound is now a triangular-shaped wound on the medial aspect. At roughly 12 to 2:00 this probes another centimeter but as opposed to in the past this does not probe to bone. The patient has been seen at [facility] by Dr. Zenia Resides. He is not planning to do any more revascularization unless the wound stalls or worsens per the patient 10/02/16; 0.7 x 0.8 x 0.8. Unfortunately although the wound looks stable to improved. There is now easily probable bone. This hasn't been present for several weeks. Patient is not otherwise symptomatic he is not experiencing any pain. I did a culture of the wound bed 10/09/16. Deterioration last week. Culture grew MRSA and although there is improvement here with doxycycline prescribed over the phone I'm going to try to get him linezolid 600 twice a day for 10 days today. 10/16/16; he is completing a weeks worth of linezolid and still has 3 more days to go. Small  triangular-shaped open area with some degree of undermining. There is still palpable bone with a curet. Overall the area appears better than last week 10/20/16 he has completed the linezolid still having some nausea and vomiting but no diarrhea. He has exposed bone this week which is a deterioration. 10/27/16 patient now has a small but probing wound down to bone. Culture of this bone that I did last week showed a few methicillin-resistant staph aureus. I have little doubt that this represents acute/subacute osteomyelitis. The patient is currently on Doxy which I will continue he also completed 10 days of linezolid. We are now in a difficult situation with this patient's foot after considerable discussion we will send him back to see Dr. Doran Durand for a surgical opinion of this I'm also going to try to arrange a infectious disease consult at [facility] hopefully week and get this prior to her usual 4-6 weeks we having [place]. The patient clearly is going to need 6 weeks of IV vancomycin. If we cannot arrange this expediently I'll have to consider ordering this myself through a home infusion company 11/03/16; the patient now has a small in terms of circumference but probing wound. No bone palpable today. The patient remains on doxycycline 100 twice a day which should support him until he sees infectious disease at [facility] next week the following week on Wednesday I believe he has an appointment with Dr. Andree Elk at [facility] who is his vascular cardiologist. Finally he has an appointment with Dr. Doran Durand on 11/22/16 we have been using silver alginate. The patient's wife states they are having trouble getting this through [facility] 11/13/16; the patient was seen by infectious disease at [facility] in the  11th PICC line placed in preparation for IV antibiotics. A tummy he has not going to get IV vancomycin o Ceftaroline. They've also ordered an MRI. Patient has a follow-up with Dr. Doran Durand on 11/22/16 and Dr. Andree Elk at  [facility] tomorrow 11/23/16 the patient is on daptomycin as directed by infectious disease at [facility]. He is also been back to see Dr. Andree Elk at [facility]. He underwent a repeat arteriogram. He had a successful PTA of the right anterior tibial artery. He is on dual antiplatelete treatment with Plavix and aspirin. Finally he had the MRI of his foot in [place]. This showed cellulitis about the foot worse distally edema and enhancement in the reminiscent of the second metatarsal was consistent with osteomyelitis therefore what I was assuming to be the first metatarsal may be actually the second. He also has a fluid collection deep to the calcaneus at the level of the calcaneal spur which could be an abscess or due to adventitial bursitis. He had a small tear in his Achilles 11/30/16; the patient continues on daptomycin as directed by infectious disease at [facility]. He is been revascularized by Dr. Andree Elk at [facility] in [place]. He has been to see Gretta Arab who was the orthopedic surgeon who did his original amputation. I have not seen his not however per the patient's wife he did not offer another surgical local surgical prodecure to remove involved bone. He verbalized his usual disbelief in not just hyperbarics but any medical therapy for this condition(osteomyelitis). I discussed this in detail with the patient today including answering the question about a BKA definitively "curing" the current condition. 12/07/16; the patient continues on daptomycin as directed by infectious disease at [facility]. This is directed at the MRSA that we cultured from his bone debridement from 12/22. Lab work today shows a white count of 8.7 hemoglobin of 10.5 which is microcytic and hypochromic differential count shows a slightly elevated monocyte count at 1.2 eosinophilic count of 0.6. His creatinine is 1.11 sedimentation rate apparently is gone from 35-34 now 40. I explained was wife I don't think this represents  a trend. His total CK is 48 12/14/16- patient is here for follow-up evaluation of his right TMA site. He continues to receive IV daptomycin per infectious disease. His serum inflammatory markers remain elevated. He complains of intermittent pain to the medial aspect of the TMA site with intermittent erythema. He voices no complaints or concerns regarding hyperbaric therapy. Overall he and his wife are expressing a frustration and discouragement regarrding the length of time of treatment. 12/21/16; small open wound at roughly the first or second metatarsal metatarsalphalyngeal joint reminiscence of his transmetatarsal amputation site he continues to receive IV daptomycin per infectious disease at [facility]. He will finish these a week tomorrow. He has lab work which I been copied on. His white count is 10.8 hemoglobin 8.9 MCV is low at 75., MCH low at 24.5 platelet count slightly elevated at 626. Differential count shows 70% neutrophils 10% monocytes and 10% eosinophils. His comprehensive metabolic panel shows a slightly low sodium at 133 albumin low at 3.1 total CK is normal at 42 sedimentation rate is much higher at 82. He has had iron studies that show a serum iron of 15 and iron binding capacity of 238 and iron saturation of 6. This is suggestive of iron deficiency. B12 and folate were normal ferritin at 113 The patient tells me that he is not eating well and he has lost weight. He feels episodically nauseated.  He is coughing and gagging on mucus which she thinks is sinusitis. He has an appointment with his primary doctor at 5:00 this afternoon in [facility] 01/02/17; the patient developed a subacute pneumonitis. He was admitted to [facility] after a CT scan showed an extensive interstitial pneumonitis [I have not yet seen this]. 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 He has since  been discharged. He is not on oxygen. He feels fatigued and very short of breath with exertion. At [facility] the wound care nurse there felt that his wound was healed. He did complete his daptomycin and has follow-up with infectious disease on Friday. X-rays I did before he went to [facility] still suggested residual osteomyelitis in the anterior aspect of the second must metatarsal head. I'm not sure I would've expected any different. There was no other findings. I actually think I did this because of erythema over the first metatarsal head reminiscent 01/11/17; the patient has eosinophilic pneumonitis. He has been reviewed by pulmonology and given clearance for hyperbarics at least that's what his wife says. I'll need to see if there is note in care everywhere. Apparently the prognosis for improvement of daptomycin induced eosinophilic granulocyte is is 3 months without steroids. In the meantime infectious disease has placed him on doxycycline until the wound is closed. He is still is lost a lot of weight and his blood sugars are running in the mid 60s to low 80s fasting and at all times during the day 01/18/17; he has had adjustments in his insulin apparently his blood sugars in the morning or over 100. He wants to restart his hyperbaric treatment we'll do this at 1:00. 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 I think there is good reason to complete his treatments in order to give him the best chance of maintaining a healed status and these DFU 3 wounds with MRSA infection in the bone 02/15/17; the patient was seen today in conjunction with HBO. He completed hyperbaric oxygen today. The open area on his transmetatarsal site has remained closed. There was an area of erythema when I saw him earlier in the week on the posterior heel although that is resolved as of today as well. He has been using a cam walker. This  is a patient who came to [place] after a transmetatarsal amputation that was necrotic and dehisced. He required revascularization percutaneously on 2 different occasions by Dr. Andree Elk of invasive cardiology at [facility]. 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 He went to [facility] infectious disease and had IV daptomycin for 5 weeks before developing eosinophilic pneumonitis and requiring an admission to hospital/ICU. He made a good recovery and is continued on doxycycline since. As mentioned his foot is closed now. He has a follow-up with Dr. Andree Elk tomorrow. He is going to [employer] on Monday for a custom-made shoe READMISSION Last visit Dr. Andree Elk [facility] 02/16/17 1. Critical limb ischemia of the RLE:  s/p transmetatarsal amputation of the RLE, now completely healed.  s/p right ATA percutaneous revascularization procedure x2, most recently 11/20/2016 s/p PTA of right AT 100% to less than 20% with a 2.5 x 200 balloon.  He does have some residual osteomyelitis, but given the wound is closed, the orthopedist has recommended to follow. He has a  fitting for a special shoe coming up next week. He has completed a course of daptomycin and doxycycline and he has been discharged by ID. He has completed a course of hyperbaric therapy.  Continue Continue medical management with aspirin, Plavix and statin therapy. We will discuss ongoing Plavix therapy at follow-up in 6 months.  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 Given no symptoms, we will conservatively manage. He doesn't want to do any more invasive studies at this time, which is reasonable. The patient arrives today out of 2 concerns both on the right transmetatarsal site. 1 at the level of the reminiscent fifth  metatarsal head and the other at roughly the first or second. Both of these look like dark subcutaneous discoloration probably subdermal bleeding. He is recently obtained new adaptive footwear for the right foot. He also has a callus on the left fifth dorsal toe however he follows with podiatry for this and I don't think this is any issue. ABIs in this clinic today were 0.66 on the right and 1.06 on the left. On entrance into our clinic initially this was 0.95 and 0.92. He does not describe current claudication. They're going away on a cruise in 8 weeks and I think are trying to do the month is much as they can proactively. They follow with podiatry and have an appointment with Dr. Andree Elk in October READMISSION 06/25/18 This is a patient that we have not seen in almost a year. He is a type II diabetic with known PAD. He is followed by Dr. Andree Elk of interventional cardiology at [facility] in [place]. He is required revascularization for significant PAD. When we first saw him he required a transmetatarsal amputation. He had underlying osteomyelitis with a nonhealing surgical wound. This eventually closed with wound care, IV antibiotics and hyperbaric oxygen. They tell me that he has a modified shoe and he is very active walking up to 4 miles a day. He is followed by Dr. Geroge Baseman of podiatry. His wife states that he underwent a removal of callus over this site on July 9. She felt there may be drainage from this site after that although she could never really determined and there was callus buildup again. On 06/01/18 there was pressure bleeding through the overlying callus. he was given a prescription for 7 days of Bactrim. Fortuitously he has an appointment with Dr. Andree Elk on 06/04/18 and he immediately underwent revascularization of the right leg although I have not had a chance to review these records in care everywhere. This was apparently done through anterior and retrograde access. On 06/06/18 he had  another debridement by Dr. Bernette Mayers. Vitamin soaking with Epsom salts for 20 minutes and applying calcium alginate. The original trans-met was on April 2017 I believe by Dr. Doran Durand. His ABI in our clinic was noncompressible today. 07/02/18; x-ray I ordered last week was negative for osteomyelitis. Swab culture was also negative. He is going to require an MRI which I have ordered today. 07/09/18; surprisingly the MRI of the foot that I ordered did not show osteomyelitis. He did suggest the possibility of cellulitis. For this reason I'll go ahead and give him a 10 day course of doxycycline. Although the previous culture of this area was negative 07/16/18; it arrives with the wound looking much the same. Roughly the same depth. He has thick subcutaneous tissue around the wound orifice but this  still has roughly the same depth. Been using silver alginate. We applied Oasis #1 today 07/23/2018; still having to remove a lot of callus and thick subcutaneous tissue to actually define the wound here. Most of this seems to have closed down yet he has a comma shaped divot over the top of the area that still I think is open. We applied Oasis #2 His wife expressed concern about the tip of his left great toe. This almost looks like a small blister. She also showed it today to her podiatrist Dr. Geroge Baseman who did not think this was anything serious. I am not sure is anything serious either however I think it bears some watching. He is not in any pain however he is insensate 07/30/2018;; still on a lot of nonviable tissue over the surface of the wound however cleaning this up reveals a more substantial wound orifice but less of a probing wound depth. This is not probed to bone. There is no evidence of infection The wife is still concerned about a non-open area on the tip of his left great toe. Almost feels like a bony outgrowth. She had previously showed this to podiatry. I do not think this is a blister. A friction area  would be possible although he is really not walking according to his wife. He had a small skin tag on his right buttock but no open wound here either 08/06/2018; we applied a third Oasis last week. Unfortunately there is really no improvement. Still requiring extensive debridement to expose the wound bed from a horizontal slitlike depression. I still have not been able to get this to fill in properly. On the positive side there is now no probable bone from when he first came into the facility. I changed him to silver alginate today after a reasonably aggressive debridement 08/13/2018; once again the patient comes in with skin and subcutaneous tissue closing over the small probing area with the underlying cavity of the wound on the right TMA site. I applied silver alginate to this last week. Prior to that we used Oasis x3 still not able to get this area granulating. He does not have a probing area of the bone which is an improvement from when I spur started working on this and an MRI did not suggest osteomyelitis. I do not see evidence of infection here but I am increasingly concerned about why I cannot get this area to granulate. Each time I debrided this is looks like this is simply a matter of getting granulation to fill in the hole and then getting epithelialization. This does not seem to happen Also I sent him back to see podiatry Dr. Earleen Newport about the what felt to be bony outgrowth on the tip of his left great toe. Apparently after heel he left the clinic last week or the next day he developed a blood blister. He did see Dr. Earleen Newport. He went on to have a debridement of the medial nail cuticle he now has an open area here as well as some denuded skin. They did an x-ray apparently does have a bony outgrowth or spur but I am not able to look at this. They are using topical antibiotics apparently there was some suggested he use Santyl. Patient's wife was anxious for my opinion of this 08/20/2018;  we are able to keep the wound open this time instead of the thick subcutaneous tissue closing over the top of it however unfortunately once again this probes to bone. I do not see  any evidence of infection and previous MRI did not show osteomyelitis. I elected to go back to the Oasis to see if we can stimulate some granulation. Last saw his vascular interventional cardiologist Dr. Andree Elk at the beginning of August and he had a repeat procedure. Nevertheless I wonder how much blood flow he has down to this area With regards to the left first toe he is seeing Dr. Earleen Newport next week. They are applying Bactroban to this area. 08/27/2018 ooOnce again he comes in with thick eschar and subcutaneous tissue over the top of the small probing hole. This does not appear to go down to bone but it still has roughly the same depth. I reapplied Oasis today ooOver the left great toe there appears to be more of the wound at the tip of his toe than there was last week. This has an eschar on the surface of it. Will change to Santyl. There is seeing podiatry this afternoon 09/03/2018 ooHe comes in today with the area on the transmetatarsal's site with a fair amount of callus, nonviable tissue over the circumference but it was not closed. I removed all of this as well as some subcutaneous debris and reapplied Oasis. There is no exposed bone ooThe area over the tip of the left great toe started off as a nodule of uncertain etiology. He has been followed with podiatry. They have been removing part of the medial nail bed. He has nonviable tissue over the wound he been using Santyl in this area 09/10/18 ooUnfortunately comes in with neither wound area looking improved. The transmetatarsal amputation site once Again has nonviable debris over the surface requiring debridement. Unfortunately underneath this there is nothing that looks viable and this once again goes right down to bone. There is no purulent drainage and no  erythema. ooAlso the surgical wound from podiatry on the left first toe has an ischemic-looking eschar over the surface of the tip of the toe I have elected not to attempt candidly debride this ooHis wife as arranged for him to have follow-up noninvasive studies in [place] under the care of Dr. Andree Elk clinic. The question is he has known severe PAD. They had recently seen him in August. He has had revascularizations in both legs within the last 4 or 5 months. He is not complaining of pain and I cannot really get a history of claudication. He has not systemically unwell 09/20/2018 the patient has been to [facility] and been revascularized by Dr. Andree Elk earlier this week. Apparently he was able to open up the anterior tibial artery although I have not actually seen his formal report. He is going for an attempt to revascularize on the left on Monday. He is apparently working with an investigational stent for lower extremity arteries below the knee and he has talked to the patient about placing that on Monday if possible. The patient has been using silver alginate on the transmetatarsal amputation site and Santyl on the left 09/30/2018; patient had his revascularization on the left this apparently included a standard approach as well as a more distal arterial catheterization although I do not have any information on this from Dr. Andree Elk. In fact I do not even see the initial revascularization that he had on the right. I have included the arterial history from Dr. Andree Elk last note however below; ASSESSMENT/PLAN: 1. Hx of Critical limb ischemia bilateral lower extremities, PAD: -s/p transmetatarsal amputation of the RLE. -s/p right ATA percutaneous revascularization procedure x2, most recently 11/20/2016 s/p PTA  of right AT 100% to less than 20% with a 2.5 x 200 balloon. 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 -03/21/2018 s/p PTA of 90% left popliteal artery to <10% with a 5x20 cutting balloon, PTA of 90% left peroneal to <20% with a 3x20 balloon, PTA of 100% left AT to <20% with a 2.5x220 balloon. 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 Will plan on the LLE soon after. Risks/benefits of procedure have been discussed and patient has elected to proceed. We have been using endoform to the right TMA amputation site wound and Santyl to the left great toe 10/07/2018; the area on the tip of his left great toe looked better we have been using Santyl here. We continue to have a very difficult probing hole on the right TMA amputation site we have been using endoform. I went on to use his fifth Oasis today 10/14/2018; the tip of the left great toe continues to look better. We have been using Santyl here the surface however is healthy and I think we can change to an alginate. The right TMA has not changed. Once again he has no superficial opening there is callus and thick subcutaneous tissue over the orifice once you remove this there is the probing area that we have been dealing with without too much change. There is no palpable bone I have been placing Oasis here and put Oasis #6 in this today after a more vigorous debridement 10/21/18; the left great toe still has necrotic surface requiring debridement. The right TMA site hasn't changed in view of the thick callus over the wound bed. With removal of this there is still the opening however this does not appear to have the same depth. Again there is no palpable bone. Oasis was replaced 10/28/2018; patient comes in with both wounds looking worse. The area over the first toe tip is now down to bone. The area over the TMA site is deeper and down to bone clearly with a increase  in overall wound area. Equally concerning on the right TMA is the complete absence of a pulse this week which is a change. We are not even able to Doppler this. On the right he has a noncompressible ABI greater than 1.4 11/04/2018. Both wounds look somewhat worse. X-rays showed no osteomyelitis of the right foot but on the left there was underlying osteomyelitis in the left great toe distal phalanx. I been on the phone to Dr. Andree Elk surface at [facility] in [place] and they are arranging for another angiogram on the right on Monday. I have him on doxycycline for the osteomyelitis in the left great toe for now. Infectious disease may be necessary 1/17; 2-week hiatus. Patient was admitted to hospital at [facility]. My understanding is he underwent an angioplasty of the right anterior tibial artery and had stents placed in the left anterior artery and the left tibial peroneal trunk. This was done by Dr. Andree Elk of interventional radiology. There is no major change in either 1 of the wounds. They have been using Aquacel Ag. As far as they are aware no imaging studies were done of the foot which is indeed unfortunate. I had him on doxycycline for 2 weeks since we identified the osteomyelitis in  the left great toe by plain x-ray. I have renewed that again today. I am still suspicious about osteomyelitis in the amputation site and would consider doing another MRI to compare with the one done in September. The idea of hyperbaric oxygen certainly comes up for discussion 1/24; no major change in either wound area. I have him on doxycycline for osteomyelitis at the tip of the left great toe. As noted he has been previously and recently revascularized by Dr. Andree Elk at [place]. He is tolerating the doxycycline well. For some reason we do not have an infectious disease consult yet. Culture of drainage from the right foot site last week was negative 1/31; MRI of the right foot did not show osteomyelitis of the right ankle and  foot. Notable for a skin ulceration overlying the second metatarsal stump with generalizing soft tissue edema of the ankle and foot consistent with cellulitis. Noted to have a partial-thickness tear of the Achilles tendon 7.5 cm proximal to the insertion Nothing really new in terms of symptoms. Patient's area on the tip of the left great toe is just about closed although he still has the probing area on the metatarsal amputation site. Appointment with Dr. Linus Salmons of infectious disease next week 2/7; Dr. Novella Olive did not feel that any further antibiotics were necessary he would follow-up in 2 months. The left great toe appears to be closed still some surface callus that I gently looked under high did not see anything open or anything that was threatening to be open. He still has the open area on the mid part of his TMA site using endoform 2/14; left great toe is closed and he is completing his doxycycline as of last Sunday. He is not on any antibiotics. Unfortunately out of the right foot his wife noticed some subdermal hemorrhage this week. He had been walking 2 miles I had given him permission to do so this is not really a plantar wound. Using endoform to this wound but I changed to silver alginate this week 2/21; left great toe remains closed. Culture last week grew Streptococcus angiosis which I am not really familiar with however it is penicillin sensitive and I am going to put him on Augmentin. Not much change in the wound on the right foot the deep area is still probing precariously close to bone and the wound on the margin of the TMA is larger. 2/28; left great toe remains closed. He is completing the Augmentin I gave him last week. 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 This is being shown to Dr. Andree Elk at [place] to see if there is anything else that can be done here. He has been using silver alginate strips on the right 3/6; left great toe remains closed.  He sees Dr. Andree Elk on Monday. The area on the plantar aspect of the right foot has the same small orifice with thick callused tissue around this. However this time with removal of the callus tissue the wound is open all the way along the incision line to the end medially. We have been using silver alginate. 3/13; left toe remains closed although the area is callused. He sees Dr. Jacqualyn Posey of podiatry next week. The area on the plantar right foot looked a lot better this week. Culture I did of this was negative we use silver alginate. He is going next week for an attempt at revascularization by Dr. Andree Elk 3/23; left toe remains closed although the area is callused. He  will see Dr. Jacqualyn Posey in follow-up. The area on the right plantar foot continues to look surprisingly better over the last 3 visits. We have been using silver alginate. The revascularization he was supposed to have by Dr. Andree Elk at [facility] in [place] has been canceled [facility] procedure] 4/6; the right foot remains closed albeit callused. Podiatry canceled the appointment with regards to the left great toe. He has not seen Dr. Andree Elk at [facility] but thinks that Dr. Andree Elk has "done all he can do". He would be a candidate for the hemostaemix trial Readmission 07/29/2019 [name] is a man we know well from at least 3 previous stays in this clinic. He is a type II diabetic with severe PAD followed by Dr. Andree Elk at [facility] in [place]. 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 So far everything in both of these areas has remained closed. About 2 weeks ago he went to see his podiatrist at [facility] Dr. Babs Bertin. He had a thick callus on the left fifth metatarsal head that was shaved. He has developed an open wound in this area. They have been offloading this in his diabetic shoes. The patient has not had  any more revascularizations by Dr. Andree Elk since the last time he was here. ABI in our clinic at the posterior tibial on the left was 1.06 10/6; no real change in the area on the plantar met head. Small wound with 2 mm of depth. He has thick skin probably from pressure around the wound. We have been using silver alginate 10/13; small wound in the left fifth plantar met head. Arrives today with undermining laterally and purulent drainage. Our intake nurse cultured this. His wife stated they noticed a change in color over the last day or 2. He is not systemically unwell 10/19; small wound on the fifth plantar metatarsal head. Culture I did last week showed Staphylococcus lugdunensis. Although this could be a skin contaminant the possibility of a skin and soft tissue infection was there. I did give him empiric doxycycline which should have covered this. We are using silver alginate to the wound. We put him in a total contact cast today. 10/22; small wound on the fifth plantar metatarsal head. He has completed antibiotics. He also has severe PAD which worries me about just about any wound on this man's foot 10/29; small superficial area on the fifth plantar metatarsal head. Measuring slightly smaller. He is not currently on any antibiotics. I been using a total contact cast in this man with very severe PAD but he seems to be tolerating this well 11/5; left plantar fifth metatarsal head. Silver alginate being used under a total contact cast 11/12; wound not much different than last week. Using a #15 scalpel debridement around the wound. Still silver collagen under a total contact cast. He has severe PAD and that may be playing a role in this 11/19; disappointing that the wound is not really changed that much. I think it is come down in overall surface area because originally this was on the lateral part of the fifth metatarsal head however recently it is not really changed. Most of this is filled in  but it will not epithelialized there is surface debris on this which may be mostly related to ischemia. They have an appointment with Dr. Andree Elk on 12/9 09/24/2019 on  evaluation today patient appears to be doing somewhat better with regard to the wound on the left fifth metatarsal head. Fortunately there does not appear to be any signs of active infection at this time. No fevers, chills, nausea, vomiting, or diarrhea. The wound does not appear to be completely closed and I do feel like the Hydrofera Blue is helping to some degree although I feel like the cast as well as keeping things from breaking down or getting any larger at least. As far as his wound overview it shows that the overall size of the wound is slightly smaller but really maintaining within the realm of about the same. He had no troubles with the cast which is good news. 12/1; left fifth metatarsal head. Perhaps somewhat more vibrant. We have been using Hydrofera Blue. He sees Dr. Andree Elk at [place] 1 week tomorrow. He be back next week and I hope to have a better idea which way the wound is going however I will not cast him next week in case Dr. Andree Elk wants to reevaluate things in his foot. The left leg is the leg with the stent in place and I wonder whether these are going to have to be reevaluated. 12/8; left fifth metatarsal head. Quite a bit better this week. Only a small open area remains. He sees Dr. Andree Elk at [place] tomorrow. Dr. Andree Elk is previously done his vascular interventions. He has 2 stents in the left leg as I remember things. I therefore will not put him in a total contact cast today. He will be using a forefoot off loader. We will use silver collagen on the wound. We will see him again next week 12/15; left fifth metatarsal head. He saw Dr. Andree Elk and is scheduled for an angiogram on Thursday. Unfortunately although his wound was a lot better last week it is really deteriorated this week. Small punched-out hole with depth and  overhanging tissue. We have been using Hydrofera Blue Objective Constitutional Sitting or standing Blood Pressure is within target range for patient.. Pulse regular and within target range for patient.Marland Kitchen Respirations regular, non-labored and within target range.. Temperature is normal and within the target range for the patient.Marland Kitchen Appears in no distress. Vitals Time Taken: 1:20 PM, Height: 69 in, Weight: 210 lbs, BMI: 31, Temperature: 98.6 F, Pulse: 79 bpm, Respiratory Rate: 20 breaths/min, Blood Pressure: 128/76 mmHg, Capillary Blood Glucose: 120 mg/dl. Cardiovascular Pedal pulses are not palpable. General Notes: Wound exam; using a #3 curette to remove the overhanging skin and some necrotic debris from the wound circumference. The wound is a lot larger. Hemostasis with direct pressure. No overt infection Integumentary (Hair, Skin) Wound #5 status is Open. Original cause of wound was Gradually Appeared. The wound is located on the Left Metatarsal head fifth. The wound measures 0.4cm length x 0.5cm width x 0.3cm depth; 0.157cm^2 area and 0.047cm^3 volume. There is Fat Layer (Subcutaneous Tissue) Exposed exposed. There is no tunneling or undermining noted. There is a medium amount of serosanguineous drainage noted. The wound margin is thickened. There is large (67-100%) pink granulation within the wound bed. There is no necrotic tissue within the wound bed. Assessment Active Problems ICD-10 Type 2 diabetes mellitus with foot ulcer Type 2 diabetes mellitus with diabetic peripheral angiopathy without gangrene Non-pressure chronic ulcer of other part of left foot limited to breakdown of skin Cellulitis of left lower limb Procedures Wound #5 Pre-procedure diagnosis of Wound #5 is a Diabetic Wound/Ulcer of the Lower Extremity located on the Left Metatarsal  head fifth .Severity of Tissue Pre Debridement is: Fat layer exposed. There was a Excisional Skin/Subcutaneous Tissue Debridement with a  total area of 0.2 sq cm performed by Ricard Dillon., MD. With the following instrument(s): Curette to remove Viable and Non-Viable tissue/material. Material removed includes Subcutaneous Tissue, Slough, Skin: Dermis, and Skin: Epidermis after achieving pain control using Lidocaine 5% topical ointment. No specimens were taken. A time out was conducted at 14:06, prior to the start of the procedure. A Moderate amount of bleeding was controlled with Pressure. The procedure was tolerated well with a pain level of 0 throughout and a pain level of 0 following the procedure. Post Debridement Measurements: 0.6cm length x 0.7cm width x 0.3cm depth; 0.099cm^3 volume. Character of Wound/Ulcer Post Debridement is improved. Severity of Tissue Post Debridement is: Fat layer exposed. Post procedure Diagnosis Wound #5: Same as Pre-Procedure Plan Follow-up Appointments: Return Appointment in 1 week. Dressing Change Frequency: Wound #5 Left Metatarsal head fifth: Change Dressing every other day. Wound Cleansing: Wound #5 Left Metatarsal head fifth: May shower and wash wound with soap and water. Primary Wound Dressing: Wound #5 Left Metatarsal head fifth: Hydrofera Blue - READY Secondary Dressing: Wound #5 Left Metatarsal head fifth: Kerlix/Rolled Gauze - secure with tape Dry Gauze 1. I have continued with Hydrofera Blue, Kerlix and rolled gauze 2. He has an appointment for next Tuesday. If possible I will try to put him in on Friday for a total contact cast Electronic Signature(s) Signed: 10/14/2019 5:53:46 PM By: [name] MD Entered By: [name] on 10/14/2019 14:16:05 -------------------------------------------------------------------------------- SuperBill Details Patient Name: Date of Service: [name], [name] 10/14/2019 Medical Record [id number] Patient Account Number: [id number] Date of Birth/Sex: Treating RN: 1946/08/20 (73 y.o. M) Primary Care Provider: [name] Other Clinician: Referring Provider: Treating Provider/Extender:Estoria Geary, [name], [name] in Treatment: 11 Diagnosis Coding ICD-10 Codes Code Description E11.621 Type 2 diabetes mellitus with foot ulcer E11.51 Type 2 diabetes mellitus with diabetic peripheral angiopathy without gangrene L97.521 Non-pressure chronic ulcer of other part of left foot limited to breakdown of skin L03.116 Cellulitis of left lower limb Facility Procedures CPT4 Code Description: 66060045 11042 - DEB SUBQ TISSUE 20 SQ CM/< ICD-10 Diagnosis Description L97.521 Non-pressure chronic ulcer of other part of left foot lim Modifier: ited to breakdo Quantity: 1 wn of skin Physician Procedures CPT4 Code Description: [id number] 23953 - WC PHYS SUBQ TISS 20 SQ CM ICD-10 Diagnosis Description L97.521 Non-pressure chronic ulcer of other part of left foot lim Modifier: ited to breakdo Quantity: 1 wn of skin Electronic Signature(s) Signed: 10/14/2019 5:53:46 PM By: [name] MD Entered By: [name] on 10/14/2019 14:37:05

## 2019-10-16 NOTE — Progress Notes (Signed)
[name], Pinchus ([id number]) Visit Report for 10/14/2019 Arrival Information Details Patient Name: Date of Service: [name], [name] 10/14/2019 1:00 PM Medical Record [id number] Patient Account Number: [id number] Date of Birth/Sex: Treating RN: 01/22/1946 (73 y.o. Harlon FlorM) Deaton, Millard.LoaBobbi Primary Care Tivon Lemoine: Selinda FlavinHoward, Kevin Other Clinician: Referring Stacyann Mcconaughy: Treating Kaden Dunkel/Extender:[name], Peter CongoMichael Howard, Wadie LessenKevin Weeks in Treatment: 11 Visit Information History Since Last Visit Added or deleted any No Patient Arrived: Ambulatory medications: Arrival Time: 13:20 Any new allergies or adverse No Accompanied By: self reactions: Transfer Assistance: None Had a fall or experienced change No Patient Identification Verified: Yes in Secondary Verification Process Yes activities of daily living that may Completed: affect Patient Requires Transmission-Based No risk of falls: Precautions: Signs or symptoms of No Patient Has Alerts: No abuse/neglect since last visito Hospitalized since last visit: No Implantable device outside of the No clinic excluding cellular tissue based products placed in the center since last visit: Has Dressing in Place as Yes Prescribed: Has Footwear/Offloading in Place Yes as Prescribed: Left: Surgical Shoe with Pressure Relief Insole Pain Present Now: No Electronic Signature(s) Signed: 10/14/2019 1:45:50 PM By: Shawn Stalleaton, Bobbi Entered By: Shawn Stalleaton, Bobbi on 10/14/2019 13:29:41 -------------------------------------------------------------------------------- Encounter Discharge Information Details Patient Name: Date of Service: [name], [name] 10/14/2019 1:00 PM Medical Record [id number] Patient Account Number: [id number] Date of Birth/Sex: Treating RN: 01/22/1946 [age] y.o. [name]) Dwiggins, Shannon Primary Care Raeshawn Tafolla: Other Clinician: Selinda FlavinHoward, Kevin Referring Tej Murdaugh: Treating Shaguana Love/Extender:[name], Peter CongoMichael Howard, Wadie LessenKevin Weeks in Treatment:  11 Encounter Discharge Information Items Post Procedure Vitals Discharge Condition: Stable Temperature (F): 98.6 Ambulatory Status: Ambulatory Pulse (bpm): 79 Discharge Destination: Home Respiratory Rate (breaths/min): 20 Transportation: Private Auto Blood Pressure (mmHg): 128/76 Accompanied By: self Schedule Follow-up Appointment: Yes Clinical Summary of Care: Patient Declined Electronic Signature(s) Signed: 10/16/2019 5:33:40 PM By: Cherylin Mylarwiggins, Shannon Entered By: Cherylin Mylarwiggins, Shannon on 10/14/2019 14:20:19 -------------------------------------------------------------------------------- Lower Extremity Assessment Details Patient Name: Date of Service: [name], Jr 10/14/2019 1:00 PM Medical Record [id number] Patient Account Number: [id number] Date of Birth/Sex: Treating RN: 01/22/1946 (73 y.o. [name]) Deaton, Bobbi Primary Care [place]: Selinda FlavinHoward, Kevin Other Clinician: Referring Zaivion Kundrat: Treating Constance Hackenberg/Extender:[name], Peter CongoMichael Howard, Wadie LessenKevin Weeks in Treatment: 11 Edema Assessment Assessed: [name][Left: Yes] [Right: No] Edema: [Left: Ye] [Right: s] Calf Left: Right: Point of Measurement: cm From Medial Instep 40 cm cm Ankle Left: Right: Point of Measurement: cm From Medial Instep 27.5 cm cm Vascular Assessment Pulses: Dorsalis Pedis Palpable: [Left:Yes] Electronic Signature(s) Signed: 10/14/2019 1:45:50 PM By: Shawn Stalleaton, Bobbi Entered By: Shawn Stalleaton, Bobbi on 10/14/2019 13:30:17 -------------------------------------------------------------------------------- Multi Wound Chart Details Patient Name: Date of Service: [name], [name] 10/14/2019 1:00 PM Medical Record [id number] Patient Account Number: [id number] Date of Birth/Sex: Treating RN: 01/22/1946 [age] y.o. 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] Comorbid History: [5:Cataracts, Chronic sinus N/A problems/congestion, Coronary Artery Disease, Hypertension, Peripheral Arterial Disease, Type II Diabetes, Osteoarthritis, Neuropathy] Date Acquired: [5:07/14/2019] [N/A:N/A] Weeks of Treatment: [5:11] [N/A:N/A] Wound Status: [5:Open] [N/A:N/A] Measurements L x W x D 0.4x0.5x0.3 [N/A:N/A] (cm) Area (cm) : [5:0.157] [N/A:N/A] Volume (cm) : [5:0.047] [N/A:N/A] % Reduction in Area: [5:-149.20%] [N/A:N/A] % Reduction in Volume: -261.50% [N/A:N/A] Classification: [5:Grade 1] [N/A:N/A] Exudate Amount: [5:Medium] [N/A:N/A] Exudate Type: [5:Serosanguineous] [N/A:N/A] Exudate Color: [5:red,  brown] [N/A:N/A] Wound Margin: [5:Thickened] [N/A:N/A] Granulation Amount: [5:Large (67-100%)] [N/A:N/A] Granulation Quality: [5:Pink] [N/A:N/A] Necrotic Amount: [5:None Present (0%)] [N/A:N/A] Exposed Structures: [5:Fat Layer (Subcutaneous N/A Tissue) Exposed: Yes Fascia: No Tendon: No Muscle: No Joint: No Bone: No] Epithelialization: [5:Large (67-100%)] [N/A:N/A] Debridement: [5:Debridement - Excisional] [N/A:N/A] Pre-procedure [5:14:06] [N/A:N/A] Verification/Time Out Taken: Pain Control: [5:Lidocaine 5% topical ointment] [N/A:N/A] Tissue Debrided: [5:Subcutaneous, Slough] [N/A:N/A] Level: [5:Skin/Subcutaneous Tissue] [N/A:N/A] Debridement Area (sq cm):0.2 [N/A:N/A] Instrument: [5:Curette] [N/A:N/A] Bleeding: [5:Moderate] [N/A:N/A] Hemostasis Achieved: [5:Pressure] [N/A:N/A] Procedural Pain: [5:0] [N/A:N/A] Post Procedural Pain: [5:0] [N/A:N/A] Debridement Treatment  Procedure was tolerated [N/A:N/A] Response: [5:well] Post Debridement [5:0.6x0.7x0.3] [N/A:N/A] Measurements L x W x D (cm) Post Debridement [5:0.099] [N/A:N/A] Volume: (cm) Procedures Performed: Debridement [N/A:N/A] Treatment Notes Electronic Signature(s) Signed: 10/14/2019 5:53:46 PM By: Baltazar Najjar MD Entered By: Baltazar Najjar on 10/14/2019 14:12:48 -------------------------------------------------------------------------------- Multi-Disciplinary Care Plan Details Patient Name: Date of Service: PANTELIS, ELGERSMA 10/14/2019 1:00 PM Medical Record [id number] Patient Account Number: [id number] Date of Birth/Sex: Treating RN: 04/01/1946 (73 y.o. [name] Primary Care Phoua Hoadley: [name] Other Clinician: Referring Apryl Brymer: Treating Elayjah Chaney/Extender:[name], [name], [name] in Treatment: 11 Active Inactive Wound/Skin Impairment Nursing Diagnoses: Knowledge deficit related to ulceration/compromised skin integrity Goals: Patient/caregiver will verbalize understanding of skin care regimen Date Initiated: 07/29/2019 Target Resolution Date: 11/07/2019 Goal Status: Active Ulcer/skin breakdown will have a volume reduction of 30% by week 4 Date Initiated: 07/29/2019 Target Resolution Date: 10/10/2019 Goal Status: Active Interventions: Assess patient/caregiver ability to obtain necessary supplies Assess patient/caregiver ability to perform ulcer/skin care regimen upon admission and as needed Assess ulceration(s) every visit Notes: Electronic Signature(s) Signed: 10/15/2019 9:49:12 AM By: [name] RN Entered By: [name] on 10/14/2019 13:21:50 -------------------------------------------------------------------------------- Pain Assessment Details Patient Name: Date of Service: CYREE, CHUONG 10/14/2019 1:00 PM Medical Record [id number] Patient Account Number: [id number] Date of Birth/Sex: Treating RN: 04-15-1946 (73 y.o. Todd Sours Primary Care Birtie Fellman: [name] Other Clinician: Referring Sandra Brents: Treating Paolo Okane/Extender:[name], [name], [name] in Treatment: 11 Active Problems Location of Pain Severity and Description of Pain Patient Has Paino No Site Locations Rate the pain. Current Pain Level: 0 Pain Management and Medication Current Pain Management: Medication: No Cold Application: No Rest: No Massage: No Activity: No T.E.N.S.: No Heat Application: No Leg drop or elevation: No Is the Current Pain Management Adequate: Adequate How does your wound impact your activities of daily livingo Sleep: No Bathing: No Appetite: No Relationship With Others: No Bladder Continence: No Emotions: No Bowel Continence: No Work: No Toileting: No Drive: No Dressing: No Hobbies: No Electronic Signature(s) Signed: 10/14/2019 1:45:50 PM By: Shawn Stall Entered By: Shawn Stall on 10/14/2019 13:30:04 -------------------------------------------------------------------------------- Patient/Caregiver Education Details Patient Name: Date of Service: Walts, Michaelanthony 12/15/2020andnbsp1:00 PM Medical Record Patient Account Number: [id number] Number: Treating RN: [name] Nov 15, 1945 (73 y.o. Other Clinician: Date of Birth/Gender: M) Treating Baltazar Najjar Primary Care Physician: [name] Physician/Extender: Referring Physician: Joaquin Bend in Treatment: 11 Education Assessment Education Provided To: Patient Education Topics Provided Wound/Skin Impairment: Methods: Explain/Verbal Responses: State content correctly Electronic Signature(s) Signed: 10/15/2019 9:49:12 AM By: [name] RN Entered By: [name] on 10/14/2019 13:22:26 -------------------------------------------------------------------------------- Wound Assessment Details Patient Name: Date of Service: EMRICK, HENSCH 10/14/2019 1:00 PM Medical Record [id number] Patient Account  Number: [id number] Date of Birth/Sex: Treating RN: 1945-11-15 (73 y.o. Todd Sours Primary Care Lyvonne Cassell: [name] Other Clinician: Referring Chevelle Durr: Treating Layali Freund/Extender:[name], [name], [name] in Treatment: 11 Wound Status Wound Number: 5 Primary Diabetic Wound/Ulcer of  the Lower Extremity Etiology: Wound Location: Left Metatarsal head fifth Wound Open Wounding Event: Gradually Appeared Status: Date Acquired: 07/14/2019 Comorbid Cataracts, Chronic sinus problems/congestion, Weeks Of Treatment: 11 History: Coronary Artery Disease, Hypertension, Clustered Wound: No Peripheral Arterial Disease, Type II Diabetes, Osteoarthritis, Neuropathy Photos Wound Measurements Length: (cm) 0.4 % Reduction in A Width: (cm) 0.5 % Reduction in V Depth: (cm) 0.3 Epithelializatio Area: (cm) 0.157 Tunneling: Volume: (cm) 0.047 Undermining: Wound Description Classification: Grade 1 Foul Odor After Wound Margin: Thickened Slough/Fibrino Exudate Amount: Medium Exudate Type: Serosanguineous Exudate Color: red, brown Wound Bed Granulation Amount: Large (67-100%) Granulation Quality: Pink Fascia Exposed: Necrotic Amount: None Present (0%) Fat Layer (Subcu Tendon Exposed: Muscle Exposed: Joint Exposed: Bone Exposed: Cleansing: No No Exposed Structure No taneous Tissue) Exposed: Yes No No No No rea: -149.2% olume: -261.5% n: Large (67-100%) No No Treatment Notes Wound #5 (Left Metatarsal head fifth) 1. Cleanse With Wound Cleanser 2. Periwound Care Skin Prep 3. Primary Dressing Applied Hydrofera Blue 4. Secondary Dressing Dry Gauze 5. Secured With Recruitment consultant) Signed: 10/15/2019 3:47:19 PM By: Mikeal Hawthorne EMT/HBOT Signed: 10/15/2019 5:21:15 PM By: Deon Pilling Previous Signature: 10/14/2019 1:45:50 PM Version By: Deon Pilling Entered By: Mikeal Hawthorne on 10/15/2019  14:14:42 -------------------------------------------------------------------------------- Vitals Details Patient Name: Date of Service: ZERIC, BARANOWSKI 10/14/2019 1:00 PM Medical Record [id number] Patient Account Number: [id number] Date of Birth/Sex: Treating RN: 04-12-1946 (73 y.o. Lorette Ang, Meta.Reding Primary Care Antonieta Slaven: [name] Other Clinician: Referring Kylyn Sookram: Treating Raveen Wieseler/Extender:[name], [name], [name] in Treatment: 11 Vital Signs Time Taken: 13:20 Temperature (F): 98.6 Height (in): 69 Pulse (bpm): 79 Weight (lbs): 210 Respiratory Rate (breaths/min): 20 Body Mass Index (BMI): 31 Blood Pressure (mmHg): 128/76 Capillary Blood Glucose (mg/dl): 120 Reference Range: 80 - 120 mg / dl Electronic Signature(s) Signed: 10/14/2019 1:45:50 PM By: Deon Pilling Entered By: Deon Pilling on 10/14/2019 13:29:56

## 2019-10-21 ENCOUNTER — Encounter (HOSPITAL_BASED_OUTPATIENT_CLINIC_OR_DEPARTMENT_OTHER): Payer: Medicare Other | Admitting: Internal Medicine

## 2019-10-21 ENCOUNTER — Other Ambulatory Visit: Payer: Self-pay

## 2019-10-21 DIAGNOSIS — E11621 Type 2 diabetes mellitus with foot ulcer: Secondary | ICD-10-CM | POA: Diagnosis not present

## 2019-10-22 NOTE — Progress Notes (Signed)
[name], [name] ([id number]) Visit Report for 10/21/2019 Debridement Details Patient Name: Date of Service: [name], [name] 10/21/2019 1:30 PM Medical Record [id number] Patient Account Number: [id number] Date of Birth/Sex: 11-03-1945 (73 y.o. M) Treating RN: [name] Primary Care Provider: [name] Other Clinician: Referring Provider: Treating Provider/Extender:Wanita Derenzo, [name], [name] in Treatment: 12 Debridement Performed for Wound #5 Left Metatarsal head fifth Assessment: Performed By: Physician Ricard Dillon., MD Debridement Type: Debridement Severity of Tissue Pre Fat layer exposed Debridement: Level of Consciousness (Pre- Awake and Alert procedure): Pre-procedure Verification/Time Out Taken: Yes - 14:25 Start Time: 14:25 Total Area Debrided (L x W): 0.7 (cm) x 1 (cm) = 0.7 (cm) Tissue and other material Viable, Non-Viable, Slough, Subcutaneous, Slough debrided: Level: Skin/Subcutaneous Tissue Debridement Description: Excisional Instrument: Curette Bleeding: Moderate Hemostasis Achieved: Silver Nitrate End Time: 14:29 Procedural Pain: 0 Post Procedural Pain: 0 Response to Treatment: Procedure was tolerated well Level of Consciousness Awake and Alert (Post-procedure): Post Debridement Measurements of Total Wound Length: (cm) 0.7 Width: (cm) 1 Depth: (cm) 0.2 Volume: (cm) 0.11 Character of Wound/Ulcer Post Improved Debridement: Severity of Tissue Post Debridement: Fat layer exposed Post Procedure Diagnosis Same as Pre-procedure Electronic Signature(s) Signed: 10/21/2019 6:32:44 PM By: [name] MD Signed: 10/22/2019 11:01:46 AM By: [name] RN Entered By: [name] on 10/21/2019 16:01:17 -------------------------------------------------------------------------------- HPI Details Patient Name: Date of Service: [name], [name] 10/21/2019 1:30 PM Medical Record [id number] Patient Account Number: [id number] Date  of Birth/Sex: Treating RN: Dec 17, 1945 (73 y.o. [name] Primary Care Provider: [name] Other Clinician: Referring Provider: Treating Provider/Extender:Margaretha Mahan, [name], [name] in Treatment: 12 History of Present Illness HPI Description: 05/18/16; this is a 73year-old diabetic who is a type II diabetic on insulin. The history is that he traumatized his right foot developed a sore sometime in late March. 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 According to the patient and his wife this wound never really healed. He was seen on 2 occasions in the wound care center in [place] and had vascular studies and then was referred urgently to Dr. Bridgett Larsson of vascular surgery. He underwent an angiogram on 05/10/16. Unfortunately nothing really could be done to improve his vascular status. He had a 75-90% stenosis in the midsegment of 1 segment of the posterior femoral artery. He had a patent popliteal, his anterior tibial occluded shortly after takeoff. Perineal had a greater than 90% stenosis posterior tibial is occluded feet had no distal collaterals feed distal aspect of the transmetatarsal amputation site. The patient tells me that he had a prolonged period of Santyl by Dr. Doran Durand was some initial improvement but then this was stopped. I think they're only applying daily dressings/dry dressings. He has not had a recent x-ray of the right foot he did have one before his surgery in April. His wife by the dimensions of the wound/surgical site being followed at home since 4/20. At that point the dimensions were 0.5 x 12 x 0.2 on 7/19 this was 1.8 x 6 x 0.4. He is not currently on any antibiotics. His hemoglobin A1c in early April was 12.9 at that point he was  started on insulin. Apparently his blood sugars are much lower he has an appointment with Dr. Legrand Como Alteimer of endocrine next week. 05/29/16 x-ray  of the area did not show osteomyelitis. I think he probably needs an MRI at this point. His wife is asking about something called"Yireh" cream which is not FDA approved. I have not heard of this. 06/22/16; MRI did not really suggest osteomyelitis. There was minimal marrow edema and enhancement in the stump of the second metatarsal felt to be secondary likely to postoperative change rather than osteomyelitis. The patient has arranged his own consultation with Dr. Andree Elk at [place], apparently their daughter lives in [place] and has some connection here. Any improvement in vascular supply by Dr. Andree Elk would of course be helpful. Dr. Bridgett Larsson did not feel that anything further could be done other than amputation if wound care did not result in healing or if the area deteriorates. 06/26/16; the patient has been to see Dr. Andree Elk at [place] and had an angiogram. He is going for a procedure on Thursday which will involve catheterization. I'm not sure if this is an anterograde or retrograde approach. He has been using Santyl to the wound 07/10/16; the patient had a repeat angiogram and angioplasty at [facility] by Dr. Brunetta Jeans. His angiogram showed right CFA and profundal widely patent. The right as of a.m. popliteal artery were widely patent the right anterior tibial was occluded proximally and reconstitutes at the ankle. Peroneal artery was patent to the foot. Posterior tibial artery was occluded. The patient had angioplasty of the anterior tibial artery.. This was quite successful. He was recommended for Plavix as well as aspirin. 07/17/16; the patient was close to be a nurse visit today however the outer dressing of the Apligraf fell off. Noted drainage. I was asked to see the wound. The patient is noted an odor however his wife had noted that. Drainage with Apligraf not  necessarily a bad thing. He has not been systemically unwell 07/24/16; we are still have an issue with drainage of this wound. In spite of this I applied his second Apligraf. Medially the area still is probing to bone. 08/07/16; Apligraf reapplied in general wound looks improved. 08/21/16 Apligraf #4. Wound looks much better 09/04/16 patientt's wound again today continues to appear to improve with the application of the Apligraf's. He notes no increased discomfort or concerns at this point in time. 09/18/16; the patient returns today 2 weeks after his fifth application of Apligraf. Predictably three quarters of the width of this wound has healed. The deep area that probe to bone medially is still open. The patient asked how much out-of-pocket dollars would be for additional Apligraf's. 09/25/16; now using Hydrofera Blue. He has completed 5 Apligraf applications with considerable improvement in this deep open transmetatarsal amputation site. His wound is now a triangular-shaped wound on the medial aspect. At roughly 12 to 2:00 this probes another centimeter but as opposed to in the past this does not probe to bone. The patient has been seen at [facility] by Dr. Zenia Resides. He is not planning to do any more revascularization unless the wound stalls or worsens per the patient 10/02/16; 0.7 x 0.8 x 0.8. Unfortunately although the wound looks stable to improved. There is now easily probable bone. This hasn't been present for several weeks. Patient is not otherwise symptomatic he is not experiencing any pain. I did a culture of the wound bed 10/09/16. Deterioration last week. Culture grew MRSA and although there is improvement here with doxycycline prescribed over the phone I'm going to try to get him linezolid 600 twice a day for 10 days today. 10/16/16; he is completing a  weeks worth of linezolid and still has 3 more days to go. Small triangular-shaped open area with some degree of undermining. There is still  palpable bone with a curet. Overall the area appears better than last week 10/20/16 he has completed the linezolid still having some nausea and vomiting but no diarrhea. He has exposed bone this week which is a deterioration. 10/27/16 patient now has a small but probing wound down to bone. Culture of this bone that I did last week showed a few methicillin-resistant staph aureus. I have little doubt that this represents acute/subacute osteomyelitis. The patient is currently on Doxy which I will continue he also completed 10 days of linezolid. We are now in a difficult situation with this patient's foot after considerable discussion we will send him back to see Dr. Doran Durand for a surgical opinion of this I'm also going to try to arrange a infectious disease consult at [facility] hopefully week and get this prior to her usual 4-6 weeks we having [place]. The patient clearly is going to need 6 weeks of IV vancomycin. If we cannot arrange this expediently I'll have to consider ordering this myself through a home infusion company 11/03/16; the patient now has a small in terms of circumference but probing wound. No bone palpable today. The patient remains on doxycycline 100 twice a day which should support him until he sees infectious disease at [facility] next week the following week on Wednesday I believe he has an appointment with Dr. Andree Elk at [facility] who is his vascular cardiologist. Finally he has an appointment with Dr. Doran Durand on 11/22/16 we have been using silver alginate. The patient's wife states they are having trouble getting this through [facility] 11/13/16; the patient was seen by infectious disease at [facility] in the 11th PICC line placed in preparation for IV antibiotics. A tummy he has not going to get IV vancomycin o Ceftaroline. They've also ordered an MRI. Patient has a follow-up with Dr. Doran Durand on 11/22/16 and Dr. Andree Elk at [facility] tomorrow 11/23/16 the patient is on daptomycin as directed by  infectious disease at [facility]. He is also been back to see Dr. Andree Elk at [facility]. He underwent a repeat arteriogram. He had a successful PTA of the right anterior tibial artery. He is on dual antiplatelete treatment with Plavix and aspirin. Finally he had the MRI of his foot in [place]. This showed cellulitis about the foot worse distally edema and enhancement in the reminiscent of the second metatarsal was consistent with osteomyelitis therefore what I was assuming to be the first metatarsal may be actually the second. He also has a fluid collection deep to the calcaneus at the level of the calcaneal spur which could be an abscess or due to adventitial bursitis. He had a small tear in his Achilles 11/30/16; the patient continues on daptomycin as directed by infectious disease at [facility]. He is been revascularized by Dr. Andree Elk at [facility] in [place]. He has been to see Gretta Arab who was the orthopedic surgeon who did his original amputation. I have not seen his not however per the patient's wife he did not offer another surgical local surgical prodecure to remove involved bone. He verbalized his usual disbelief in not just hyperbarics but any medical therapy for this condition(osteomyelitis). I discussed this in detail with the patient today including answering the question about a BKA definitively "curing" the current condition. 12/07/16; the patient continues on daptomycin as directed by infectious disease at [facility]. This is directed at  the MRSA that we cultured from his bone debridement from 12/22. Lab work today shows a white count of 8.7 hemoglobin of 10.5 which is microcytic and hypochromic differential count shows a slightly elevated monocyte count at 1.2 eosinophilic count of 0.6. His creatinine is 1.11 sedimentation rate apparently is gone from 35-34 now 40. I explained was wife I don't think this represents a trend. His total CK is 48 12/14/16- patient is here for follow-up  evaluation of his right TMA site. He continues to receive IV daptomycin per infectious disease. His serum inflammatory markers remain elevated. He complains of intermittent pain to the medial aspect of the TMA site with intermittent erythema. He voices no complaints or concerns regarding hyperbaric therapy. Overall he and his wife are expressing a frustration and discouragement regarrding the length of time of treatment. 12/21/16; small open wound at roughly the first or second metatarsal metatarsalphalyngeal joint reminiscence of his transmetatarsal amputation site he continues to receive IV daptomycin per infectious disease at [facility]. He will finish these a week tomorrow. He has lab work which I been copied on. His white count is 10.8 hemoglobin 8.9 MCV is low at 75., MCH low at 24.5 platelet count slightly elevated at 626. Differential count shows 70% neutrophils 10% monocytes and 10% eosinophils. His comprehensive metabolic panel shows a slightly low sodium at 133 albumin low at 3.1 total CK is normal at 42 sedimentation rate is much higher at 82. He has had iron studies that show a serum iron of 15 and iron binding capacity of 238 and iron saturation of 6. This is suggestive of iron deficiency. B12 and folate were normal ferritin at 113 The patient tells me that he is not eating well and he has lost weight. He feels episodically nauseated. He is coughing and gagging on mucus which she thinks is sinusitis. He has an appointment with his primary doctor at 5:00 this afternoon in [facility] 01/02/17; the patient developed a subacute pneumonitis. He was admitted to [facility] after a CT scan showed an extensive interstitial pneumonitis [I have not yet seen this]. 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 He has since been discharged. He is not on oxygen. He feels fatigued and very short of  breath with exertion. At [facility] the wound care nurse there felt that his wound was healed. He did complete his daptomycin and has follow-up with infectious disease on Friday. X-rays I did before he went to [facility] still suggested residual osteomyelitis in the anterior aspect of the second must metatarsal head. I'm not sure I would've expected any different. There was no other findings. I actually think I did this because of erythema over the first metatarsal head reminiscent 01/11/17; the patient has eosinophilic pneumonitis. He has been reviewed by pulmonology and given clearance for hyperbarics at least that's what his wife says. I'll need to see if there is note in care everywhere. Apparently the prognosis for improvement of daptomycin induced eosinophilic granulocyte is is 3 months without steroids. In the meantime infectious disease has placed him on doxycycline until the wound is closed. He is still is lost a lot of weight and his blood sugars are running in the mid 60s to low 80s fasting and at all times during the day 01/18/17; he has had adjustments in his insulin apparently his blood sugars in the morning or over 100. He wants to restart his hyperbaric treatment we'll do  this at 1:00. 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 I think there is good reason to complete his treatments in order to give him the best chance of maintaining a healed status and these DFU 3 wounds with MRSA infection in the bone 02/15/17; the patient was seen today in conjunction with HBO. He completed hyperbaric oxygen today. The open area on his transmetatarsal site has remained closed. There was an area of erythema when I saw him earlier in the week on the posterior heel although that is resolved as of today as well. He has been using a cam walker. This is a patient who came to [place] after a transmetatarsal amputation that was  necrotic and dehisced. He required revascularization percutaneously on 2 different occasions by Dr. Andree Elk of invasive cardiology at [facility]. 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 He went to [facility] infectious disease and had IV daptomycin for 5 weeks before developing eosinophilic pneumonitis and requiring an admission to hospital/ICU. He made a good recovery and is continued on doxycycline since. As mentioned his foot is closed now. He has a follow-up with Dr. Andree Elk tomorrow. He is going to [employer] on Monday for a custom-made shoe READMISSION Last visit Dr. Andree Elk [facility] 02/16/17 1. Critical limb ischemia of the RLE: s/p transmetatarsal amputation of the RLE, now completely healed. s/p right ATA percutaneous revascularization procedure x2, most recently 11/20/2016 s/p PTA of right AT 100% to less than 20% with a 2.5 x 200 balloon. He does have some residual osteomyelitis, but given the wound is closed, the orthopedist has recommended to follow. He has a fitting for a special shoe coming up next week. He has completed a course of daptomycin and doxycycline and he has been discharged by ID. He has completed a course of hyperbaric therapy. Continue Continue medical management with aspirin, Plavix and statin therapy. We will discuss ongoing Plavix therapy at follow-up in 6 months. 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 Given no symptoms, we will conservatively manage. He doesn't want to do any more invasive studies at this time, which is reasonable. The patient arrives today out of 2 concerns both on the right transmetatarsal site. 1 at the level of the reminiscent fifth metatarsal head and the other at roughly the first or second. Both of these look like dark  subcutaneous discoloration probably subdermal bleeding. He is recently obtained new adaptive footwear for the right foot. He also has a callus on the left fifth dorsal toe however he follows with podiatry for this and I don't think this is any issue. ABIs in this clinic today were 0.66 on the right and 1.06 on the left. On entrance into our clinic initially this was 0.95 and 0.92. He does not describe current claudication. They're going away on a cruise in 8 weeks and I think are trying to do the month is much as they can proactively. They follow with podiatry and have an appointment with Dr. Andree Elk in October READMISSION 06/25/18 This is a patient that we have not seen in almost a year. He is a type II diabetic with known PAD. He is followed by Dr. Andree Elk of interventional cardiology at [facility] in [place]. He is required revascularization for significant PAD.  When we first saw him he required a transmetatarsal amputation. He had underlying osteomyelitis with a nonhealing surgical wound. This eventually closed with wound care, IV antibiotics and hyperbaric oxygen. They tell me that he has a modified shoe and he is very active walking up to 4 miles a day. He is followed by Dr. Geroge Baseman of podiatry. His wife states that he underwent a removal of callus over this site on July 9. She felt there may be drainage from this site after that although she could never really determined and there was callus buildup again. On 06/01/18 there was pressure bleeding through the overlying callus. he was given a prescription for 7 days of Bactrim. Fortuitously he has an appointment with Dr. Andree Elk on 06/04/18 and he immediately underwent revascularization of the right leg although I have not had a chance to review these records in care everywhere. This was apparently done through anterior and retrograde access. On 06/06/18 he had another debridement by Dr. Bernette Mayers. Vitamin soaking with Epsom salts for 20 minutes and  applying calcium alginate. The original trans-met was on April 2017 I believe by Dr. Doran Durand. His ABI in our clinic was noncompressible today. 07/02/18; x-ray I ordered last week was negative for osteomyelitis. Swab culture was also negative. He is going to require an MRI which I have ordered today. 07/09/18; surprisingly the MRI of the foot that I ordered did not show osteomyelitis. He did suggest the possibility of cellulitis. For this reason I'll go ahead and give him a 10 day course of doxycycline. Although the previous culture of this area was negative 07/16/18; it arrives with the wound looking much the same. Roughly the same depth. He has thick subcutaneous tissue around the wound orifice but this still has roughly the same depth. Been using silver alginate. We applied Oasis #1 today 07/23/2018; still having to remove a lot of callus and thick subcutaneous tissue to actually define the wound here. Most of this seems to have closed down yet he has a comma shaped divot over the top of the area that still I think is open. We applied Oasis #2 His wife expressed concern about the tip of his left great toe. This almost looks like a small blister. She also showed it today to her podiatrist Dr. Geroge Baseman who did not think this was anything serious. I am not sure is anything serious either however I think it bears some watching. He is not in any pain however he is insensate 07/30/2018;; still on a lot of nonviable tissue over the surface of the wound however cleaning this up reveals a more substantial wound orifice but less of a probing wound depth. This is not probed to bone. There is no evidence of infection The wife is still concerned about a non-open area on the tip of his left great toe. Almost feels like a bony outgrowth. She had previously showed this to podiatry. I do not think this is a blister. A friction area would be possible although he is really not walking according to his wife. He had a  small skin tag on his right buttock but no open wound here either 08/06/2018; we applied a third Oasis last week. Unfortunately there is really no improvement. Still requiring extensive debridement to expose the wound bed from a horizontal slitlike depression. I still have not been able to get this to fill in properly. On the positive side there is now no probable bone from when he first came into the facility. I  changed him to silver alginate today after a reasonably aggressive debridement 08/13/2018; once again the patient comes in with skin and subcutaneous tissue closing over the small probing area with the underlying cavity of the wound on the right TMA site. I applied silver alginate to this last week. Prior to that we used Oasis x3 still not able to get this area granulating. He does not have a probing area of the bone which is an improvement from when I spur started working on this and an MRI did not suggest osteomyelitis. I do not see evidence of infection here but I am increasingly concerned about why I cannot get this area to granulate. Each time I debrided this is looks like this is simply a matter of getting granulation to fill in the hole and then getting epithelialization. This does not seem to happen Also I sent him back to see podiatry Dr. Earleen Newport about the what felt to be bony outgrowth on the tip of his left great toe. Apparently after heel he left the clinic last week or the next day he developed a blood blister. He did see Dr. Earleen Newport. He went on to have a debridement of the medial nail cuticle he now has an open area here as well as some denuded skin. They did an x-ray apparently does have a bony outgrowth or spur but I am not able to look at this. They are using topical antibiotics apparently there was some suggested he use Santyl. Patient's wife was anxious for my opinion of this 08/20/2018; we are able to keep the wound open this time instead of the thick subcutaneous tissue  closing over the top of it however unfortunately once again this probes to bone. I do not see any evidence of infection and previous MRI did not show osteomyelitis. I elected to go back to the Oasis to see if we can stimulate some granulation. Last saw his vascular interventional cardiologist Dr. Andree Elk at the beginning of August and he had a repeat procedure. Nevertheless I wonder how much blood flow he has down to this area With regards to the left first toe he is seeing Dr. Earleen Newport next week. They are applying Bactroban to this area. 08/27/2018 Once again he comes in with thick eschar and subcutaneous tissue over the top of the small probing hole. This does not appear to go down to bone but it still has roughly the same depth. I reapplied Oasis today Over the left great toe there appears to be more of the wound at the tip of his toe than there was last week. This has an eschar on the surface of it. Will change to Santyl. There is seeing podiatry this afternoon 09/03/2018 He comes in today with the area on the transmetatarsal's site with a fair amount of callus, nonviable tissue over the circumference but it was not closed. I removed all of this as well as some subcutaneous debris and reapplied Oasis. There is no exposed bone The area over the tip of the left great toe started off as a nodule of uncertain etiology. He has been followed with podiatry. They have been removing part of the medial nail bed. He has nonviable tissue over the wound he been using Santyl in this area 09/10/18 Unfortunately comes in with neither wound area looking improved. The transmetatarsal amputation site once Again has nonviable debris over the surface requiring debridement. Unfortunately underneath this there is nothing that looks viable and this once again goes right down to bone.  There is no purulent drainage and no erythema. Also the surgical wound from podiatry on the left first toe has an ischemic-looking eschar  over the surface of the tip of the toe I have elected not to attempt candidly debride this His wife as arranged for him to have follow-up noninvasive studies in [place] under the care of Dr. Andree Elk clinic. The question is he has known severe PAD. They had recently seen him in August. He has had revascularizations in both legs within the last 4 or 5 months. He is not complaining of pain and I cannot really get a history of claudication. He has not systemically unwell 09/20/2018 the patient has been to [facility] and been revascularized by Dr. Andree Elk earlier this week. Apparently he was able to open up the anterior tibial artery although I have not actually seen his formal report. He is going for an attempt to revascularize on the left on Monday. He is apparently working with an investigational stent for lower extremity arteries below the knee and he has talked to the patient about placing that on Monday if possible. The patient has been using silver alginate on the transmetatarsal amputation site and Santyl on the left 09/30/2018; patient had his revascularization on the left this apparently included a standard approach as well as a more distal arterial catheterization although I do not have any information on this from Dr. Andree Elk. In fact I do not even see the initial revascularization that he had on the right. I have included the arterial history from Dr. Andree Elk last note however below; ASSESSMENT/PLAN: 1. Hx of Critical limb ischemia bilateral lower extremities, PAD: -s/p transmetatarsal amputation of the RLE. -s/p right ATA percutaneous revascularization procedure x2, most recently 11/20/2016 s/p PTA of right AT 100% to less than 20% with a 2.5 x 200 balloon. 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 -03/21/2018 s/p PTA of 90% left popliteal artery to <10% with a  5x20 cutting balloon, PTA of 90% left peroneal to <20% with a 3x20 balloon, PTA of 100% left AT to <20% with a 2.5x220 balloon. 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 Will plan on the LLE soon after. Risks/benefits of procedure have been discussed and patient has elected to proceed. We have been using endoform to the right TMA amputation site wound and Santyl to the left great toe 10/07/2018; the area on the tip of his left great toe looked better we have been using Santyl here. We continue to have a very difficult probing hole on the right TMA amputation site we have been using endoform. I went on to use his fifth Oasis today 10/14/2018; the tip of the left great toe continues to look better. We have been using Santyl here the surface however is healthy and I think we can change to an alginate. The right TMA has not changed. Once again he has no superficial opening there is callus and thick subcutaneous tissue over the orifice once you remove this there is the probing area that we have been dealing with without too much change. There is no palpable bone I have been placing Oasis here and put Oasis #6 in this today after a more vigorous debridement 10/21/18; the left great toe still has  necrotic surface requiring debridement. The right TMA site hasn't changed in view of the thick callus over the wound bed. With removal of this there is still the opening however this does not appear to have the same depth. Again there is no palpable bone. Oasis was replaced 10/28/2018; patient comes in with both wounds looking worse. The area over the first toe tip is now down to bone. The area over the TMA site is deeper and down to bone clearly with a increase in overall wound area. Equally concerning on the right TMA is the complete absence of a pulse this week  which is a change. We are not even able to Doppler this. On the right he has a noncompressible ABI greater than 1.4 11/04/2018. Both wounds look somewhat worse. X-rays showed no osteomyelitis of the right foot but on the left there was underlying osteomyelitis in the left great toe distal phalanx. I been on the phone to Dr. Andree Elk surface at [facility] in [place] and they are arranging for another angiogram on the right on Monday. I have him on doxycycline for the osteomyelitis in the left great toe for now. Infectious disease may be necessary 1/17; 2-week hiatus. Patient was admitted to hospital at [place]. My understanding is he underwent an angioplasty of the right anterior tibial artery and had stents placed in the left anterior artery and the left tibial peroneal trunk. This was done by Dr. Andree Elk of interventional radiology. There is no major change in either 1 of the wounds. They have been using Aquacel Ag. As far as they are aware no imaging studies were done of the foot which is indeed unfortunate. I had him on doxycycline for 2 weeks since we identified the osteomyelitis in the left great toe by plain x-ray. I have renewed that again today. I am still suspicious about osteomyelitis in the amputation site and would consider doing another MRI to compare with the one done in September. The idea of hyperbaric oxygen certainly comes up for discussion 1/24; no major change in either wound area. I have him on doxycycline for osteomyelitis at the tip of the left great toe. As noted he has been previously and recently revascularized by Dr. Andree Elk at [place]. He is tolerating the doxycycline well. For some reason we do not have an infectious disease consult yet. Culture of drainage from the right foot site last week was negative 1/31; MRI of the right foot did not show osteomyelitis of the right ankle and foot. Notable for a skin ulceration overlying the second metatarsal stump with generalizing soft tissue  edema of the ankle and foot consistent with cellulitis. Noted to have a partial-thickness tear of the Achilles tendon 7.5 cm proximal to the insertion Nothing really new in terms of symptoms. Patient's area on the tip of the left great toe is just about closed although he still has the probing area on the metatarsal amputation site. Appointment with Dr. Linus Salmons of infectious disease next week 2/7; Dr. Novella Olive did not feel that any further antibiotics were necessary he would follow-up in 2 months. The left great toe appears to be closed still some surface callus that I gently looked under high did not see anything open or anything that was threatening to be open. He still has the open area on the mid part of his TMA site using endoform 2/14; left great toe is closed and he is completing his doxycycline as of last Sunday. He is not on any antibiotics. Unfortunately out  of the right foot his wife noticed some subdermal hemorrhage this week. He had been walking 2 miles I had given him permission to do so this is not really a plantar wound. Using endoform to this wound but I changed to silver alginate this week 2/21; left great toe remains closed. Culture last week grew Streptococcus angiosis which I am not really familiar with however it is penicillin sensitive and I am going to put him on Augmentin. Not much change in the wound on the right foot the deep area is still probing precariously close to bone and the wound on the margin of the TMA is larger. 2/28; left great toe remains closed. He is completing the Augmentin I gave him last week. 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 This is being shown to Dr. Andree Elk at [place] to see if there is anything else that can be done here. He has been using silver alginate strips on the right 3/6; left great toe remains closed. He sees Dr. Andree Elk on Monday. The area on the plantar aspect of the right foot has the same small orifice  with thick callused tissue around this. However this time with removal of the callus tissue the wound is open all the way along the incision line to the end medially. We have been using silver alginate. 3/13; left toe remains closed although the area is callused. He sees Dr. Jacqualyn Posey of podiatry next week. The area on the plantar right foot looked a lot better this week. Culture I did of this was negative we use silver alginate. He is going next week for an attempt at revascularization by Dr. Andree Elk 3/23; left toe remains closed although the area is callused. He will see Dr. Jacqualyn Posey in follow-up. The area on the right plantar foot continues to look surprisingly better over the last 3 visits. We have been using silver alginate. The revascularization he was supposed to have by Dr. Andree Elk at [facility] in [place] has been canceled [facility] procedure] 4/6; the right foot remains closed albeit callused. Podiatry canceled the appointment with regards to the left great toe. He has not seen Dr. Andree Elk at [facility] but thinks that Dr. Andree Elk has "done all he can do". He would be a candidate for the hemostaemix trial Readmission 07/29/2019 [name] is a man we know well from at least 3 previous stays in this clinic. He is a type II diabetic with severe PAD followed by Dr. Andree Elk at [facility] in [place]. 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 So far everything in both of these areas has remained closed. About 2 weeks ago he went to see [facility] podiat[facility]. Babs Bertin. He had a thick callus on the left fifth metatarsal head that was shaved. He has developed an open wound in this area. They have been offloading this in his diabetic shoes. The patient has not had any more revascularizations by Dr. Andree Elk since the last time he was here. ABI in our clinic at the  posterior tibial on the left was 1.06 10/6; no real change in the area on the plantar met head. Small wound with 2 mm of depth. He has thick skin probably from pressure around the wound. We have been using silver alginate 10/13; small wound in the left fifth plantar  met head. Arrives today with undermining laterally and purulent drainage. Our intake nurse cultured this. His wife stated they noticed a change in color over the last day or 2. He is not systemically unwell 10/19; small wound on the fifth plantar metatarsal head. Culture I did last week showed Staphylococcus lugdunensis. Although this could be a skin contaminant the possibility of a skin and soft tissue infection was there. I did give him empiric doxycycline which should have covered this. We are using silver alginate to the wound. We put him in a total contact cast today. 10/22; small wound on the fifth plantar metatarsal head. He has completed antibiotics. He also has severe PAD which worries me about just about any wound on this man's foot 10/29; small superficial area on the fifth plantar metatarsal head. Measuring slightly smaller. He is not currently on any antibiotics. I been using a total contact cast in this man with very severe PAD but he seems to be tolerating this well 11/5; left plantar fifth metatarsal head. Silver alginate being used under a total contact cast 11/12; wound not much different than last week. Using a #15 scalpel debridement around the wound. Still silver collagen under a total contact cast. He has severe PAD and that may be playing a role in this 11/19; disappointing that the wound is not really changed that much. I think it is come down in overall surface area because originally this was on the lateral part of the fifth metatarsal head however recently it is not really changed. Most of this is filled in but it will not epithelialized there is surface debris on this which may be mostly related  to ischemia. They have an appointment with Dr. Andree Elk on 12/9 09/24/2019 on evaluation today patient appears to be doing somewhat better with regard to the wound on the left fifth metatarsal head. Fortunately there does not appear to be any signs of active infection at this time. No fevers, chills, nausea, vomiting, or diarrhea. The wound does not appear to be completely closed and I do feel like the Hydrofera Blue is helping to some degree although I feel like the cast as well as keeping things from breaking down or getting any larger at least. As far as his wound overview it shows that the overall size of the wound is slightly smaller but really maintaining within the realm of about the same. He had no troubles with the cast which is good news. 12/1; left fifth metatarsal head. Perhaps somewhat more vibrant. We have been using Hydrofera Blue. He sees Dr. Andree Elk at [place] 1 week tomorrow. He be back next week and I hope to have a better idea which way the wound is going however I will not cast him next week in case Dr. Andree Elk wants to reevaluate things in his foot. The left leg is the leg with the stent in place and I wonder whether these are going to have to be reevaluated. 12/8; left fifth metatarsal head. Quite a bit better this week. Only a small open area remains. He sees Dr. Andree Elk at [place] tomorrow. Dr. Andree Elk is previously done his vascular interventions. He has 2 stents in the left leg as I remember things. I therefore will not put him in a total contact cast today. He will be using a forefoot off loader. We will use silver collagen on the wound. We will see him again next week 12/15; left fifth metatarsal head. He saw Dr. Andree Elk and is scheduled for an  angiogram on Thursday. Unfortunately although his wound was a lot better last week it is really deteriorated this week. Small punched-out hole with depth and overhanging tissue. We have been using Hydrofera Blue 12/22; patient had an 80% stenosis  proximal to the stent I believe in the below-knee popliteal artery. This was opened by Dr. Andree Elk. According the patient the stent itself was satisfactory. I have not been able to review this note. Electronic Signature(s) Signed: 10/21/2019 6:32:44 PM By: [name] MD Entered By: [name] on 10/21/2019 16:01:57 -------------------------------------------------------------------------------- Physical Exam Details Patient Name: Date of Service: [name], [name] 10/21/2019 1:30 PM Medical Record [id number] Patient Account Number: [id number] Date of Birth/Sex: Treating RN: 05/08/1946 (73 y.o. [name] Primary Care Provider: [name] Other Clinician: Referring Provider: Treating Provider/Extender:Zyla Dascenzo, [name], [name] in Treatment: 12 Constitutional Sitting or standing Blood Pressure is within target range for patient.. Pulse regular and within target range for patient.Marland Kitchen Respirations regular, non-labored and within target range.. Temperature is normal and within the target range for the patient.Marland Kitchen Appears in no distress. Cardiovascular Faint left dorsalis pedis pulse.. Notes Wound exam using a #3 curette to remove overhanging debris from the wound surface and circumference. The wound looks healthy. Some bleeding which is nice to see Electronic Signature(s) Signed: 10/21/2019 6:32:44 PM By: [name] MD Entered By: [name] on 10/21/2019 16:03:06 -------------------------------------------------------------------------------- Physician Orders Details Patient Name: Date of Service: [name], [name] 10/21/2019 1:30 PM Medical Record [id number] Patient Account Number: [id number] Date of Birth/Sex: Treating RN: 01-26-1946 (73 y.o. [name]) [name] Primary Care Provider: [name] Other Clinician: Referring Provider: Treating Provider/Extender:Maximino Cozzolino, [name], [name] in Treatment: 12 Verbal / Phone Orders:  No Diagnosis Coding ICD-10 Coding Code Description E11.621 Type 2 diabetes mellitus with foot ulcer E11.51 Type 2 diabetes mellitus with diabetic peripheral angiopathy without gangrene L97.521 Non-pressure chronic ulcer of other part of left foot limited to breakdown of skin L03.116 Cellulitis of left lower limb Follow-up Appointments Return Appointment in 1 week. Dressing Change Frequency Wound #5 Left Metatarsal head fifth Change Dressing every other day. Wound Cleansing Wound #5 Left Metatarsal head fifth May shower and wash wound with soap and water. Primary Wound Dressing Wound #5 Left Metatarsal head fifth Hydrofera Blue - READY Other: - pad right great toe for protection Off-Loading Total Contact Cast to Left Lower Extremity Electronic Signature(s) Signed: 10/21/2019 6:32:44 PM By: [name] MD Signed: 10/22/2019 11:01:46 AM By: [name] RN Entered By: [name] on 10/21/2019 14:39:39 -------------------------------------------------------------------------------- Problem List Details Patient Name: Date of Service: [name], [name] 10/21/2019 1:30 PM Medical Record [id number] Patient Account Number: [id number] Date of Birth/Sex: Treating RN: [birth year] (73 y.o. Staci Acosta, Morey Hummingbird Primary Care Provider: [name] Other Clinician: Referring Provider: Treating Provider/Extender:Daleigh Pollinger, [name], [name] in Treatment: 12 Active Problems ICD-10 Evaluated Encounter Code Description Active Date Today Diagnosis E11.621 Type 2 diabetes mellitus with foot ulcer 07/29/2019 No Yes E11.51 Type 2 diabetes mellitus with diabetic peripheral 07/29/2019 No Yes angiopathy without gangrene L97.521 Non-pressure chronic ulcer of other part of left foot 07/29/2019 No Yes limited to breakdown of skin L03.116 Cellulitis of left lower limb 08/12/2019 No Yes Inactive Problems Resolved Problems Electronic Signature(s) Signed: 10/21/2019 6:32:44 PM By: [name] MD Entered By: [name] on 10/21/2019 16:00:09 -------------------------------------------------------------------------------- Progress Note Details Patient Name: Date of Service: [name], [name] 10/21/2019 1:30 PM Medical Record [id number] Patient Account Number: [id number] Date of Birth/Sex: Treating RN: Dec 27, 1945 (73 y.o. [name] Primary Care Provider: [name]  Other Clinician: Referring Provider: Treating Provider/Extender:Alyssia Heese, [name], [name] in Treatment: 12 Subjective History of Present Illness (HPI) 05/18/16; this is a 73year-old diabetic who is a type II diabetic on insulin. The history is that he traumatized his right foot developed a sore sometime in late March. Shortly thereafter he went on a cruise but he had to get off the cruise ship in [place] and fly urgently back to [place] where he was admitted to [facility] and ultimately underwent a transmetatarsal amputation by Dr. 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 According to the patient and his wife this wound never really healed. He was seen on 2 occasions in the wound care center in [place] and had vascular studies and then was referred urgently to Dr. Bridgett Larsson of vascular surgery. He underwent an angiogram on 05/10/16. Unfortunately nothing really could be done to improve his vascular status. He had a 75-90% stenosis in the midsegment of 1 segment of the posterior femoral artery. He had a patent popliteal, his anterior tibial occluded shortly after takeoff. Perineal had a greater than 90% stenosis posterior tibial is occluded feet had no distal collaterals feed distal aspect of the transmetatarsal amputation site. The patient tells me that he had a prolonged period of Santyl by Dr. Doran Durand was some initial improvement but then this was stopped. I think they're only applying daily dressings/dry dressings. He has not had a recent x-ray of the right foot he did have one  before his surgery in April. His wife by the dimensions of the wound/surgical site being followed at home since 4/20. At that point the dimensions were 0.5 x 12 x 0.2 on 7/19 this was 1.8 x 6 x 0.4. He is not currently on any antibiotics. His hemoglobin A1c in early April was 12.9 at that point he was started on insulin. Apparently his blood sugars are much lower he has an appointment with Dr. Legrand Como Alteimer of endocrine next week. 05/29/16 x-ray of the area did not show osteomyelitis. I think he probably needs an MRI at this point. His wife is asking about something called"Yireh" cream which is not FDA approved. I have not heard of this. 06/22/16; MRI did not really suggest osteomyelitis. There was minimal marrow edema and enhancement in the stump of the second metatarsal felt to be secondary likely to postoperative change rather than osteomyelitis. The patient has arranged his own consultation with Dr. Andree Elk at [facility], apparently their daughter lives in [place] and has some connection here. Any improvement in vascular supply by Dr. Andree Elk would of course be helpful. Dr. Bridgett Larsson did not feel that anything further could be done other than amputation if wound care did not result in healing or if the area deteriorates. 06/26/16; the patient has been to see Dr. Andree Elk at [place] and had an angiogram. He is going for a procedure on Thursday which will involve catheterization. I'm not sure if this is an anterograde or retrograde approach. He has been using Santyl to the wound 07/10/16; the patient had a repeat angiogram and angioplasty at [facility] by Dr. Brunetta Jeans. His angiogram showed right CFA and profundal widely patent. The right as of a.m. popliteal artery were widely patent the right anterior tibial was occluded proximally and reconstitutes at the ankle. Peroneal artery was patent to the foot. Posterior tibial artery was occluded. The patient had angioplasty of the anterior tibial artery.. This was quite  successful. He was recommended for Plavix as well as aspirin. 07/17/16; the patient was close  to be a nurse visit today however the outer dressing of the Apligraf fell off. Noted drainage. I was asked to see the wound. The patient is noted an odor however his wife had noted that. Drainage with Apligraf not necessarily a bad thing. He has not been systemically unwell 07/24/16; we are still have an issue with drainage of this wound. In spite of this I applied his second Apligraf. Medially the area still is probing to bone. 08/07/16; Apligraf reapplied in general wound looks improved. 08/21/16 Apligraf #4. Wound looks much better 09/04/16 patientt's wound again today continues to appear to improve with the application of the Apligraf's. He notes no increased discomfort or concerns at this point in time. 09/18/16; the patient returns today 2 weeks after his fifth application of Apligraf. Predictably three quarters of the width of this wound has healed. The deep area that probe to bone medially is still open. The patient asked how much out-of-pocket dollars would be for additional Apligraf's. 09/25/16; now using Hydrofera Blue. He has completed 5 Apligraf applications with considerable improvement in this deep open transmetatarsal amputation site. His wound is now a triangular-shaped wound on the medial aspect. At roughly 12 to 2:00 this probes another centimeter but as opposed to in the past this does not probe to bone. The patient has been seen at [facility] by Dr. Zenia Resides. He is not planning to do any more revascularization unless the wound stalls or worsens per the patient 10/02/16; 0.7 x 0.8 x 0.8. Unfortunately although the wound looks stable to improved. There is now easily probable bone. This hasn't been present for several weeks. Patient is not otherwise symptomatic he is not experiencing any pain. I did a culture of the wound bed 10/09/16. Deterioration last week. Culture grew MRSA and although there is  improvement here with doxycycline prescribed over the phone I'm going to try to get him linezolid 600 twice a day for 10 days today. 10/16/16; he is completing a weeks worth of linezolid and still has 3 more days to go. Small triangular-shaped open area with some degree of undermining. There is still palpable bone with a curet. Overall the area appears better than last week 10/20/16 he has completed the linezolid still having some nausea and vomiting but no diarrhea. He has exposed bone this week which is a deterioration. 10/27/16 patient now has a small but probing wound down to bone. Culture of this bone that I did last week showed a few methicillin-resistant staph aureus. I have little doubt that this represents acute/subacute osteomyelitis. The patient is currently on Doxy which I will continue he also completed 10 days of linezolid. We are now in a difficult situation with this patient's foot after considerable discussion we will send him back to see Dr. Doran Durand for a surgical opinion of this I'm also going to try to arrange a infectious disease consult at [facility] hopefully week and get this prior to her usual 4-6 weeks we having [place]. The patient clearly is going to need 6 weeks of IV vancomycin. If we cannot arrange this expediently I'll have to consider ordering this myself through a home infusion company 11/03/16; the patient now has a small in terms of circumference but probing wound. No bone palpable today. The patient remains on doxycycline 100 twice a day which should support him until he sees infectious disease at [facility] next week the following week on Wednesday I believe he has an appointment with Dr. Andree Elk at [facility] who is his vascular  cardiologist. Finally he has an appointment with Dr. Doran Durand on 11/22/16 we have been using silver alginate. The patient's wife states they are having trouble getting this through [facility] 11/13/16; the patient was seen by infectious disease at  [facility] in the 11th PICC line placed in preparation for IV antibiotics. A tummy he has not going to get IV vancomycin o Ceftaroline. They've also ordered an MRI. Patient has a follow-up with Dr. Doran Durand on 11/22/16 and Dr. Andree Elk at [facility] tomorrow 11/23/16 the patient is on daptomycin as directed by infectious disease at [facility]. He is also been back to see Dr. Andree Elk at [facility]. He underwent a repeat arteriogram. He had a successful PTA of the right anterior tibial artery. He is on dual antiplatelete treatment with Plavix and aspirin. Finally he had the MRI of his foot in [place]. This showed cellulitis about the foot worse distally edema and enhancement in the reminiscent of the second metatarsal was consistent with osteomyelitis therefore what I was assuming to be the first metatarsal may be actually the second. He also has a fluid collection deep to the calcaneus at the level of the calcaneal spur which could be an abscess or due to adventitial bursitis. He had a small tear in his Achilles 11/30/16; the patient continues on daptomycin as directed by infectious disease at [facility]. He is been revascularized by Dr. Andree Elk at [facility] in [place]. He has been to see Gretta Arab who was the orthopedic surgeon who did his original amputation. I have not seen his not however per the patient's wife he did not offer another surgical local surgical prodecure to remove involved bone. He verbalized his usual disbelief in not just hyperbarics but any medical therapy for this condition(osteomyelitis). I discussed this in detail with the patient today including answering the question about a BKA definitively "curing" the current condition. 12/07/16; the patient continues on daptomycin as directed by infectious disease at [facility]. This is directed at the MRSA that we cultured from his bone debridement from 12/22. Lab work today shows a white count of 8.7 hemoglobin of 10.5 which is microcytic and  hypochromic differential count shows a slightly elevated monocyte count at 1.2 eosinophilic count of 0.6. His creatinine is 1.11 sedimentation rate apparently is gone from 35-34 now 40. I explained was wife I don't think this represents a trend. His total CK is 48 12/14/16- patient is here for follow-up evaluation of his right TMA site. He continues to receive IV daptomycin per infectious disease. His serum inflammatory markers remain elevated. He complains of intermittent pain to the medial aspect of the TMA site with intermittent erythema. He voices no complaints or concerns regarding hyperbaric therapy. Overall he and his wife are expressing a frustration and discouragement regarrding the length of time of treatment. 12/21/16; small open wound at roughly the first or second metatarsal metatarsalphalyngeal joint reminiscence of his transmetatarsal amputation site he continues to receive IV daptomycin per infectious disease at [facility]. He will finish these a week tomorrow. He has lab work which I been copied on. His white count is 10.8 hemoglobin 8.9 MCV is low at 75., MCH low at 24.5 platelet count slightly elevated at 626. Differential count shows 70% neutrophils 10% monocytes and 10% eosinophils. His comprehensive metabolic panel shows a slightly low sodium at 133 albumin low at 3.1 total CK is normal at 42 sedimentation rate is much higher at 82. He has had iron studies that show a serum iron of 15 and iron binding  capacity of 238 and iron saturation of 6. This is suggestive of iron deficiency. B12 and folate were normal ferritin at 113 The patient tells me that he is not eating well and he has lost weight. He feels episodically nauseated. He is coughing and gagging on mucus which she thinks is sinusitis. He has an appointment with his primary doctor at 5:00 this afternoon in [facility] 01/02/17; the patient developed a subacute pneumonitis. He was admitted to [facility] after a CT  scan showed an extensive interstitial pneumonitis [I have not yet seen this]. 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 He has since been discharged. He is not on oxygen. He feels fatigued and very short of breath with exertion. At [facility] the wound care nurse there felt that his wound was healed. He did complete his daptomycin and has follow-up with infectious disease on Friday. X-rays I did before he went to [facility] still suggested residual osteomyelitis in the anterior aspect of the second must metatarsal head. I'm not sure I would've expected any different. There was no other findings. I actually think I did this because of erythema over the first metatarsal head reminiscent 01/11/17; the patient has eosinophilic pneumonitis. He has been reviewed by pulmonology and given clearance for hyperbarics at least that's what his wife says. I'll need to see if there is note in care everywhere. Apparently the prognosis for improvement of daptomycin induced eosinophilic granulocyte is is 3 months without steroids. In the meantime infectious disease has placed him on doxycycline until the wound is closed. He is still is lost a lot of weight and his blood sugars are running in the mid 60s to low 80s fasting and at all times during the day 01/18/17; he has had adjustments in his insulin apparently his blood sugars in the morning or over 100. He wants to restart his hyperbaric treatment we'll do this at 1:00. 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 I think there is good reason to complete his treatments in order to give him the best chance of maintaining a healed status and these DFU 3 wounds with MRSA infection in the bone 02/15/17; the patient was seen today in conjunction with HBO. He completed hyperbaric oxygen today. The  open area on his transmetatarsal site has remained closed. There was an area of erythema when I saw him earlier in the week on the posterior heel although that is resolved as of today as well. He has been using a cam walker. This is a patient who came to [place] after a transmetatarsal amputation that was necrotic and dehisced. He required revascularization percutaneously on 2 different occasions by Dr. Andree Elk of invasive cardiology at [facility]. 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 He went to [facility] infectious disease and had IV daptomycin for 5 weeks before developing eosinophilic pneumonitis and requiring an admission to hospital/ICU. He made a good recovery and is continued on doxycycline since. As mentioned his foot is closed now. He has a follow-up with Dr. Andree Elk tomorrow. He is going to [employer] on Monday for a custom-made shoe READMISSION Last visit Dr. Andree Elk [facility] 02/16/17 1. Critical limb ischemia of the RLE:  s/p transmetatarsal amputation of the RLE, now completely healed.  s/p right ATA percutaneous revascularization procedure x2,  most recently 11/20/2016 s/p PTA of right AT 100% to less than 20% with a 2.5 x 200 balloon.  He does have some residual osteomyelitis, but given the wound is closed, the orthopedist has recommended to follow. He has a fitting for a special shoe coming up next week. He has completed a course of daptomycin and doxycycline and he has been discharged by ID. He has completed a course of hyperbaric therapy.  Continue Continue medical management with aspirin, Plavix and statin therapy. We will discuss ongoing Plavix therapy at follow-up in 6 months.  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 Given no symptoms, we will  conservatively manage. He doesn't want to do any more invasive studies at this time, which is reasonable. The patient arrives today out of 2 concerns both on the right transmetatarsal site. 1 at the level of the reminiscent fifth metatarsal head and the other at roughly the first or second. Both of these look like dark subcutaneous discoloration probably subdermal bleeding. He is recently obtained new adaptive footwear for the right foot. He also has a callus on the left fifth dorsal toe however he follows with podiatry for this and I don't think this is any issue. ABIs in this clinic today were 0.66 on the right and 1.06 on the left. On entrance into our clinic initially this was 0.95 and 0.92. He does not describe current claudication. They're going away on a cruise in 8 weeks and I think are trying to do the month is much as they can proactively. They follow with podiatry and have an appointment with Dr. Andree Elk in October READMISSION 06/25/18 This is a patient that we have not seen in almost a year. He is a type II diabetic with known PAD. He is followed by Dr. Andree Elk of interventional cardiology at [facility] in [place]. He is required revascularization for significant PAD. When we first saw him he required a transmetatarsal amputation. He had underlying osteomyelitis with a nonhealing surgical wound. This eventually closed with wound care, IV antibiotics and hyperbaric oxygen. They tell me that he has a modified shoe and he is very active walking up to 4 miles a day. He is followed by Dr. Geroge Baseman of podiatry. His wife states that he underwent a removal of callus over this site on July 9. She felt there may be drainage from this site after that although she could never really determined and there was callus buildup again. On 06/01/18 there was pressure bleeding through the overlying callus. he was given a prescription for 7 days of Bactrim. Fortuitously he has an appointment with Dr. Andree Elk on  06/04/18 and he immediately underwent revascularization of the right leg although I have not had a chance to review these records in care everywhere. This was apparently done through anterior and retrograde access. On 06/06/18 he had another debridement by Dr. Bernette Mayers. Vitamin soaking with Epsom salts for 20 minutes and applying calcium alginate. The original trans-met was on April 2017 I believe by Dr. Doran Durand. His ABI in our clinic was noncompressible today. 07/02/18; x-ray I ordered last week was negative for osteomyelitis. Swab culture was also negative. He is going to require an MRI which I have ordered today. 07/09/18; surprisingly the MRI of the foot that I ordered did not show osteomyelitis. He did suggest the possibility of cellulitis. For this reason I'll go ahead and give  him a 10 day course of doxycycline. Although the previous culture of this area was negative 07/16/18; it arrives with the wound looking much the same. Roughly the same depth. He has thick subcutaneous tissue around the wound orifice but this still has roughly the same depth. Been using silver alginate. We applied Oasis #1 today 07/23/2018; still having to remove a lot of callus and thick subcutaneous tissue to actually define the wound here. Most of this seems to have closed down yet he has a comma shaped divot over the top of the area that still I think is open. We applied Oasis #2 His wife expressed concern about the tip of his left great toe. This almost looks like a small blister. She also showed it today to her podiatrist Dr. Geroge Baseman who did not think this was anything serious. I am not sure is anything serious either however I think it bears some watching. He is not in any pain however he is insensate 07/30/2018;; still on a lot of nonviable tissue over the surface of the wound however cleaning this up reveals a more substantial wound orifice but less of a probing wound depth. This is not probed to bone. There is no  evidence of infection The wife is still concerned about a non-open area on the tip of his left great toe. Almost feels like a bony outgrowth. She had previously showed this to podiatry. I do not think this is a blister. A friction area would be possible although he is really not walking according to his wife. He had a small skin tag on his right buttock but no open wound here either 08/06/2018; we applied a third Oasis last week. Unfortunately there is really no improvement. Still requiring extensive debridement to expose the wound bed from a horizontal slitlike depression. I still have not been able to get this to fill in properly. On the positive side there is now no probable bone from when he first came into the facility. I changed him to silver alginate today after a reasonably aggressive debridement 08/13/2018; once again the patient comes in with skin and subcutaneous tissue closing over the small probing area with the underlying cavity of the wound on the right TMA site. I applied silver alginate to this last week. Prior to that we used Oasis x3 still not able to get this area granulating. He does not have a probing area of the bone which is an improvement from when I spur started working on this and an MRI did not suggest osteomyelitis. I do not see evidence of infection here but I am increasingly concerned about why I cannot get this area to granulate. Each time I debrided this is looks like this is simply a matter of getting granulation to fill in the hole and then getting epithelialization. This does not seem to happen Also I sent him back to see podiatry Dr. Earleen Newport about the what felt to be bony outgrowth on the tip of his left great toe. Apparently after heel he left the clinic last week or the next day he developed a blood blister. He did see Dr. Earleen Newport. He went on to have a debridement of the medial nail cuticle he now has an open area here as well as some denuded skin. They did an  x-ray apparently does have a bony outgrowth or spur but I am not able to look at this. They are using topical antibiotics apparently there was some suggested he use Santyl. Patient's wife was  anxious for my opinion of this 08/20/2018; we are able to keep the wound open this time instead of the thick subcutaneous tissue closing over the top of it however unfortunately once again this probes to bone. I do not see any evidence of infection and previous MRI did not show osteomyelitis. I elected to go back to the Oasis to see if we can stimulate some granulation. Last saw his vascular interventional cardiologist Dr. Andree Elk at the beginning of August and he had a repeat procedure. Nevertheless I wonder how much blood flow he has down to this area With regards to the left first toe he is seeing Dr. Earleen Newport next week. They are applying Bactroban to this area. 08/27/2018 ooOnce again he comes in with thick eschar and subcutaneous tissue over the top of the small probing hole. This does not appear to go down to bone but it still has roughly the same depth. I reapplied Oasis today ooOver the left great toe there appears to be more of the wound at the tip of his toe than there was last week. This has an eschar on the surface of it. Will change to Santyl. There is seeing podiatry this afternoon 09/03/2018 ooHe comes in today with the area on the transmetatarsal's site with a fair amount of callus, nonviable tissue over the circumference but it was not closed. I removed all of this as well as some subcutaneous debris and reapplied Oasis. There is no exposed bone ooThe area over the tip of the left great toe started off as a nodule of uncertain etiology. He has been followed with podiatry. They have been removing part of the medial nail bed. He has nonviable tissue over the wound he been using Santyl in this area 09/10/18 ooUnfortunately comes in with neither wound area looking improved. The transmetatarsal  amputation site once Again has nonviable debris over the surface requiring debridement. Unfortunately underneath this there is nothing that looks viable and this once again goes right down to bone. There is no purulent drainage and no erythema. ooAlso the surgical wound from podiatry on the left first toe has an ischemic-looking eschar over the surface of the tip of the toe I have elected not to attempt candidly debride this ooHis wife as arranged for him to have follow-up noninvasive studies in [place] under the care of Dr. Andree Elk clinic. The question is he has known severe PAD. They had recently seen him in August. He has had revascularizations in both legs within the last 4 or 5 months. He is not complaining of pain and I cannot really get a history of claudication. He has not systemically unwell 09/20/2018 the patient has been to [facility] and been revascularized by Dr. Andree Elk earlier this week. Apparently he was able to open up the anterior tibial artery although I have not actually seen his formal report. He is going for an attempt to revascularize on the left on Monday. He is apparently working with an investigational stent for lower extremity arteries below the knee and he has talked to the patient about placing that on Monday if possible. The patient has been using silver alginate on the transmetatarsal amputation site and Santyl on the left 09/30/2018; patient had his revascularization on the left this apparently included a standard approach as well as a more distal arterial catheterization although I do not have any information on this from Dr. Andree Elk. In fact I do not even see the initial revascularization that he had on the right. I  have included the arterial history from Dr. Andree Elk last note however below; ASSESSMENT/PLAN: 1. Hx of Critical limb ischemia bilateral lower extremities, PAD: -s/p transmetatarsal amputation of the RLE. -s/p right ATA percutaneous revascularization  procedure x2, most recently 11/20/2016 s/p PTA of right AT 100% to less than 20% with a 2.5 x 200 balloon. 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 -03/21/2018 s/p PTA of 90% left popliteal artery to <10% with a 5x20 cutting balloon, PTA of 90% left peroneal to <20% with a 3x20 balloon, PTA of 100% left AT to <20% with a 2.5x220 balloon. 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 Will plan on the LLE soon after. Risks/benefits of procedure have been discussed and patient has elected to proceed. We have been using endoform to the right TMA amputation site wound and Santyl to the left great toe 10/07/2018; the area on the tip of his left great toe looked better we have been using Santyl here. We continue to have a very difficult probing hole on the right TMA amputation site we have been using endoform. I went on to use his fifth Oasis today 10/14/2018; the tip of the left great toe continues to look better. We have been using Santyl here the surface however is healthy and I think we can change to an alginate. The right TMA has not changed. Once again he has no superficial opening there is callus and thick subcutaneous tissue over the orifice once you remove this there is the probing area that we have been dealing with without too much change. There is no palpable bone I have been placing Oasis here and put Oasis #6 in this today after a more vigorous debridement 10/21/18; the left great toe still has necrotic surface requiring debridement. The right TMA site hasn't changed in view of the thick callus over the wound bed. With removal of this there is still the opening however this does not appear to have the same depth.  Again there is no palpable bone. Oasis was replaced 10/28/2018; patient comes in with both wounds looking worse. The area over the first toe tip is now down to bone. The area over the TMA site is deeper and down to bone clearly with a increase in overall wound area. Equally concerning on the right TMA is the complete absence of a pulse this week which is a change. We are not even able to Doppler this. On the right he has a noncompressible ABI greater than 1.4 11/04/2018. Both wounds look somewhat worse. X-rays showed no osteomyelitis of the right foot but on the left there was underlying osteomyelitis in the left great toe distal phalanx. I been on the phone to Dr. Andree Elk surface at [facility] [facility] in [place] and they are arranging for another angiogram on the right on Monday. I have him on doxycycline for the osteomyelitis in the left great toe for now. Infectious disease may be necessary 1/17; 2-week hiatus. Patient was admitted to hospital at [facility]. My understanding is he underwent an angioplasty of the right anterior tibial artery and had stents placed in the left anterior artery and the left tibial peroneal trunk. This was done by Dr. Andree Elk of interventional radiology. There is no major change in either 1  of the wounds. They have been using Aquacel Ag. As far as they are aware no imaging studies were done of the foot which is indeed unfortunate. I had him on doxycycline for 2 weeks since we identified the osteomyelitis in the left great toe by plain x-ray. I have renewed that again today. I am still suspicious about osteomyelitis in the amputation site and would consider doing another MRI to compare with the one done in September. The idea of hyperbaric oxygen certainly comes up for discussion 1/24; no major change in either wound area. I have him on doxycycline for osteomyelitis at the tip of the left great toe. As noted he has been previously and recently revascularized by Dr. Andree Elk at [place]. He is  tolerating the doxycycline well. For some reason we do not have an infectious disease consult yet. Culture of drainage from the right foot site last week was negative 1/31; MRI of the right foot did not show osteomyelitis of the right ankle and foot. Notable for a skin ulceration overlying the second metatarsal stump with generalizing soft tissue edema of the ankle and foot consistent with cellulitis. Noted to have a partial-thickness tear of the Achilles tendon 7.5 cm proximal to the insertion Nothing really new in terms of symptoms. Patient's area on the tip of the left great toe is just about closed although he still has the probing area on the metatarsal amputation site. Appointment with Dr. Linus Salmons of infectious disease next week 2/7; Dr. Novella Olive did not feel that any further antibiotics were necessary he would follow-up in 2 months. The left great toe appears to be closed still some surface callus that I gently looked under high did not see anything open or anything that was threatening to be open. He still has the open area on the mid part of his TMA site using endoform 2/14; left great toe is closed and he is completing his doxycycline as of last Sunday. He is not on any antibiotics. Unfortunately out of the right foot his wife noticed some subdermal hemorrhage this week. He had been walking 2 miles I had given him permission to do so this is not really a plantar wound. Using endoform to this wound but I changed to silver alginate this week 2/21; left great toe remains closed. Culture last week grew Streptococcus angiosis which I am not really familiar with however it is penicillin sensitive and I am going to put him on Augmentin. Not much change in the wound on the right foot the deep area is still probing precariously close to bone and the wound on the margin of the TMA is larger. 2/28; left great toe remains closed. He is completing the Augmentin I gave him last week. 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 This is being shown to Dr. Andree Elk at [place] to see if there is anything else that can be done here. He has been using silver alginate strips on the right 3/6; left great toe remains closed. He sees Dr. Andree Elk on Monday. The area on the plantar aspect of the right foot has the same small orifice with thick callused tissue around this. However this time with removal of the callus tissue the wound is open all the way along the incision line to the end medially. We have been using silver alginate. 3/13; left toe remains closed although the area is callused. He sees Dr. Jacqualyn Posey of podiatry next week. The area on the plantar right foot  looked a lot better this week. Culture I did of this was negative we use silver alginate. He is going next week for an attempt at revascularization by Dr. Andree Elk 3/23; left toe remains closed although the area is callused. He will see Dr. Jacqualyn Posey in follow-up. The area on the right plantar foot continues to look surprisingly better over the last 3 visits. We have been using silver alginate. The revascularization he was supposed to have by Dr. Andree Elk at [facility] in [place] has been canceled [facility] procedure] 4/6; the right foot remains closed albeit callused. Podiatry canceled the appointment with regards to the left great toe. He has not seen Dr. Andree Elk at [facility] but thinks that Dr. Andree Elk has "done all he can do". He would be a candidate for the hemostaemix trial Readmission 07/29/2019 [name] is a man we know well from at least 3 previous stays in this clinic. He is a type II diabetic with severe PAD followed by Dr. Andree Elk at [facility] in [place]. 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 So far everything in both of these areas has remained closed. About 2 weeks ago he went to  see his podiatrist at [facility] Dr. Babs Bertin. He had a thick callus on the left fifth metatarsal head that was shaved. He has developed an open wound in this area. They have been offloading this in his diabetic shoes. The patient has not had any more revascularizations by Dr. Andree Elk since the last time he was here. ABI in our clinic at the posterior tibial on the left was 1.06 10/6; no real change in the area on the plantar met head. Small wound with 2 mm of depth. He has thick skin probably from pressure around the wound. We have been using silver alginate 10/13; small wound in the left fifth plantar met head. Arrives today with undermining laterally and purulent drainage. Our intake nurse cultured this. His wife stated they noticed a change in color over the last day or 2. He is not systemically unwell 10/19; small wound on the fifth plantar metatarsal head. Culture I did last week showed Staphylococcus lugdunensis. Although this could be a skin contaminant the possibility of a skin and soft tissue infection was there. I did give him empiric doxycycline which should have covered this. We are using silver alginate to the wound. We put him in a total contact cast today. 10/22; small wound on the fifth plantar metatarsal head. He has completed antibiotics. He also has severe PAD which worries me about just about any wound on this man's foot 10/29; small superficial area on the fifth plantar metatarsal head. Measuring slightly smaller. He is not currently on any antibiotics. I been using a total contact cast in this man with very severe PAD but he seems to be tolerating this well 11/5; left plantar fifth metatarsal head. Silver alginate being used under a total contact cast 11/12; wound not much different than last week. Using a #15 scalpel debridement around the wound. Still silver collagen under a total contact cast. He has severe PAD and that may be playing a role in this 11/19;  disappointing that the wound is not really changed that much. I think it is come down in overall surface area because originally this was on the lateral part of the fifth metatarsal head however  recently it is not really changed. Most of this is filled in but it will not epithelialized there is surface debris on this which may be mostly related to ischemia. They have an appointment with Dr. Andree Elk on 12/9 09/24/2019 on evaluation today patient appears to be doing somewhat better with regard to the wound on the left fifth metatarsal head. Fortunately there does not appear to be any signs of active infection at this time. No fevers, chills, nausea, vomiting, or diarrhea. The wound does not appear to be completely closed and I do feel like the Hydrofera Blue is helping to some degree although I feel like the cast as well as keeping things from breaking down or getting any larger at least. As far as his wound overview it shows that the overall size of the wound is slightly smaller but really maintaining within the realm of about the same. He had no troubles with the cast which is good news. 12/1; left fifth metatarsal head. Perhaps somewhat more vibrant. We have been using Hydrofera Blue. He sees Dr. Andree Elk at [place] [facility] 1 week tomorrow. He be back next week and I hope to have a better idea which way the wound is going however I will not cast him next week in case Dr. Andree Elk wants to reevaluate things in his foot. The left leg is the leg with the stent in place and I wonder whether these are going to have to be reevaluated. 12/8; left fifth metatarsal head. Quite a bit better this week. Only a small open area remains. He sees Dr. Andree Elk at [place] tomorrow. Dr. Andree Elk is previously done his vascular interventions. He has 2 stents in the left leg as I remember things. I therefore will not put him in a total contact cast today. He will be using a forefoot off loader. We will use silver collagen on the wound. We will see  him again next week 12/15; left fifth metatarsal head. He saw Dr. Andree Elk and is scheduled for an angiogram on Thursday. Unfortunately although his wound was a lot better last week it is really deteriorated this week. Small punched-out hole with depth and overhanging tissue. We have been using Hydrofera Blue 12/22; patient had an 80% stenosis proximal to the stent I believe in the below-knee popliteal artery. This was opened by Dr. Andree Elk. According the patient the stent itself was satisfactory. I have not been able to review this note. Objective Constitutional Sitting or standing Blood Pressure is within target range for patient.. Pulse regular and within target range for patient.Marland Kitchen Respirations regular, non-labored and within target range.. Temperature is normal and within the target range for the patient.Marland Kitchen Appears in no distress. Vitals Time Taken: 2:25 PM, Height: 69 in, Weight: 210 lbs, BMI: 31, Temperature: 98 F, Pulse: 75 bpm, Respiratory Rate: 20 breaths/min, Blood Pressure: 129/72 mmHg, Capillary Blood Glucose: 150 mg/dl. Cardiovascular Faint left dorsalis pedis pulse.. General Notes: Wound exam using a #3 curette to remove overhanging debris from the wound surface and circumference. The wound looks healthy. Some bleeding which is nice to see Integumentary (Hair, Skin) Wound #5 status is Open. Original cause of wound was Gradually Appeared. The wound is located on the Left Metatarsal head fifth. The wound measures 0.7cm length x 1cm width x 0.2cm depth; 0.55cm^2 area and 0.11cm^3 volume. There is Fat Layer (Subcutaneous Tissue) Exposed exposed. There is no tunneling or undermining noted. There is a medium amount of serosanguineous drainage noted. The wound margin is thickened. There is medium (34-66%)  pink granulation within the wound bed. There is a medium (34-66%) amount of necrotic tissue within the wound bed including Adherent Slough. Assessment Active Problems ICD-10 Type 2  diabetes mellitus with foot ulcer Type 2 diabetes mellitus with diabetic peripheral angiopathy without gangrene Non-pressure chronic ulcer of other part of left foot limited to breakdown of skin Cellulitis of left lower limb Procedures Wound #5 Pre-procedure diagnosis of Wound #5 is a Diabetic Wound/Ulcer of the Lower Extremity located on the Left Metatarsal head fifth .Severity of Tissue Pre Debridement is: Fat layer exposed. There was a Excisional Skin/Subcutaneous Tissue Debridement with a total area of 0.7 sq cm performed by Ricard Dillon., MD. With the following instrument(s): Curette to remove Viable and Non-Viable tissue/material. Material removed includes Subcutaneous Tissue and Slough and. No specimens were taken. A time out was conducted at 14:25, prior to the start of the procedure. A Moderate amount of bleeding was controlled with Silver Nitrate. The procedure was tolerated well with a pain level of 0 throughout and a pain level of 0 following the procedure. Post Debridement Measurements: 0.7cm length x 1cm width x 0.2cm depth; 0.11cm^3 volume. Character of Wound/Ulcer Post Debridement is improved. Severity of Tissue Post Debridement is: Fat layer exposed. Post procedure Diagnosis Wound #5: Same as Pre-Procedure Plan Follow-up Appointments: Return Appointment in 1 week. Dressing Change Frequency: Wound #5 Left Metatarsal head fifth: Change Dressing every other day. Wound Cleansing: Wound #5 Left Metatarsal head fifth: May shower and wash wound with soap and water. Primary Wound Dressing: Wound #5 Left Metatarsal head fifth: Hydrofera Blue - READY Other: - pad right great toe for protection Off-Loading: Total Contact Cast to Left Lower Extremity 1. I used Hydrofera Blue under the total contact cast 2. He has callus on the tip of his left great toe. I would not debride this today nor what I debride this unless there was some sign that this was separating opening or  infected. Electronic Signature(s) Signed: 10/21/2019 6:32:44 PM By: [name] MD Entered By: [name] on 10/21/2019 16:04:22 -------------------------------------------------------------------------------- SuperBill Details Patient Name: Date of Service: [name], [name] 10/21/2019 Medical Record [id number] Patient Account Number: [id number] Date of Birth/Sex: Treating RN: 09/05/1946 (73 y.o. [name]) [name] Primary Care Provider: [name] Other Clinician: Referring Provider: Treating Provider/Extender:Shamyah Stantz, [name], [name] in Treatment: 12 Diagnosis Coding ICD-10 Codes Code Description E11.621 Type 2 diabetes mellitus with foot ulcer E11.51 Type 2 diabetes mellitus with diabetic peripheral angiopathy without gangrene L97.521 Non-pressure chronic ulcer of other part of left foot limited to breakdown of skin L03.116 Cellulitis of left lower limb Facility Procedures CPT4 Code: 68088110 Description: 31594 - DEB SUBQ TISSUE 20 SQ CM/< ICD-10 Diagnosis Description E11.621 Type 2 diabetes mellitus with foot ulcer Modifier: Quantity: 1 CPT4 Code: [id number] Description: 46286 - APPLY TOTAL CONTACT LEG CAST ICD-10 Diagnosis Description E11.621 Type 2 diabetes mellitus with foot ulcer Modifier: Quantity: 1 Physician Procedures CPT4 Code: 3817711 Description: 65790 - WC PHYS SUBQ TISS 20 SQ CM ICD-10 Diagnosis Description E11.621 Type 2 diabetes mellitus with foot ulcer Modifier: Quantity: 1 Electronic Signature(s) Signed: 10/21/2019 6:32:44 PM By: [name] MD Entered By: [name] on 10/21/2019 16:04:50

## 2019-10-22 NOTE — Progress Notes (Addendum)
[name], [name] ([id number]) Visit Report for 10/21/2019 Arrival Information Details Patient Name: Date of Service: [name], [name] 10/21/2019 1:30 PM Medical Record [id number] Patient Account Number: [id number] Date of Birth/Sex: Treating RN: [birth year] (73 y.o. [name], Millard.Loa Primary Care Wreatha Sturgeon: [name] Other Clinician: Referring Tersa Fotopoulos: Treating Lamberto Dinapoli/Extender:[name], [name], [name] in Treatment: 12 Visit Information History Since Last Visit Added or deleted any medications: No Patient Arrived: Ambulatory Any new allergies or adverse reactions: No Arrival Time: 14:15 Had a fall or experienced change in No Accompanied By: wife activities of daily living that may affect Transfer Assistance: None risk of falls: Patient Identification Verified: Yes Signs or symptoms of abuse/neglect since last No Secondary Verification Process Completed: Yes visito Patient Requires Transmission-Based No Hospitalized since last visit: No Precautions: Implantable device outside of the clinic excluding No Patient Has Alerts: No cellular tissue based products placed in the center since last visit: Has Dressing in Place as Prescribed: Yes Has Compression in Place as Prescribed: Yes Pain Present Now: No Electronic Signature(s) Signed: 10/21/2019 5:14:15 PM By: Shawn Stall Entered By: Shawn Stall on 10/21/2019 14:25:38 -------------------------------------------------------------------------------- Encounter Discharge Information Details Patient Name: Date of Service: [name], [name] 10/21/2019 1:30 PM Medical Record [id number] Patient Account Number: [id number] Date of Birth/Sex: Treating RN: [birth year] (73 y.o. [name] Primary Care Smitty Ackerley: [name] Other Clinician: Referring Jacora Hopkins: Treating Rolen Conger/Extender:[name], [name], [name] in Treatment: 12 Encounter Discharge Information Items Post Procedure Vitals Discharge  Condition: Stable Temperature (F): 98 Ambulatory Status: Ambulatory Pulse (bpm): 75 Discharge Destination: Home Respiratory Rate (breaths/min): 20 Transportation: Private Auto Blood Pressure (mmHg): 129/72 Accompanied By: self Schedule Follow-up Appointment: Yes Clinical Summary of Care: Electronic Signature(s) Signed: 10/21/2019 5:14:15 PM By: Shawn Stall Entered By: Shawn Stall on 10/21/2019 14:55:36 -------------------------------------------------------------------------------- Lower Extremity Assessment Details Patient Name: Date of Service: [name], [name] 10/21/2019 1:30 PM Medical Record [id number] Patient Account Number: [id number] Date of Birth/Sex: Treating RN: May 28, 1946 (73 y.o. [name] Primary Care Wesly Whisenant: [name] Other Clinician: Referring Saad Buhl: Treating Beldon Nowling/Extender:[name], [name], [name] in Treatment: 12 Edema Assessment Assessed: [Left: Yes] [Right: No] Edema: [Left: Ye] [Right: s] Calf Left: Right: Point of Measurement: cm From Medial Instep 41 cm cm Ankle Left: Right: Point of Measurement: cm From Medial Instep 28.5 cm cm Vascular Assessment Pulses: Dorsalis Pedis Palpable: [Left:Yes] Electronic Signature(s) Signed: 10/21/2019 5:14:15 PM By: Shawn Stall Entered By: Shawn Stall on 10/21/2019 14:26:39 -------------------------------------------------------------------------------- Multi Wound Chart Details Patient Name: Date of Service: [name], [name] 10/21/2019 1:30 PM Medical Record [id number] Patient Account Number: [id number] Date of Birth/Sex: Treating RN: [birth year] (73 y.o. 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] Comorbid History: [5:Cataracts, Chronic sinus N/A problems/congestion, Coronary Artery Disease, Hypertension, Peripheral Arterial Disease, Type II Diabetes, Osteoarthritis, Neuropathy] Date Acquired: [5:07/14/2019] [N/A:N/A] Weeks of Treatment: [5:12] [N/A:N/A] Wound Status: [5:Open] [N/A:N/A] Measurements L x W x D 0.7x1x0.2 [N/A:N/A] (cm) Area (cm) : [5:0.55] [N/A:N/A] Volume (cm) : [5:0.11] [N/A:N/A] % Reduction in Area: [5:-773.00%] [N/A:N/A] % Reduction in Volume: -746.20% [N/A:N/A] Classification: [5:Grade 1] [N/A:N/A] Exudate Amount: [5:Medium] [N/A:N/A] Exudate Type: [5:Serosanguineous] [N/A:N/A] Exudate Color: [5:red, brown] [N/A:N/A] Wound Margin: [5:Thickened] [N/A:N/A] Granulation  Amount: [5:Medium (34-66%)] [N/A:N/A] Granulation Quality: [5:Pink] [N/A:N/A] Necrotic Amount: [5:Medium (34-66%)] [N/A:N/A] Exposed Structures: [5:Fat Layer (Subcutaneous N/A Tissue) Exposed: Yes Fascia: No Tendon: No Muscle: No Joint: No Bone: No] Epithelialization: [5:Medium (34-66%)] [N/A:N/A] Debridement: [5:Debridement - Excisional N/A] Pre-procedure [5:14:25] [N/A:N/A] Verification/Time Out Taken: Tissue Debrided: [5:Subcutaneous, Slough] [N/A:N/A] Level: [5:Skin/Subcutaneous Tissue N/A] Debridement Area (sq cm):0.7 [N/A:N/A] Instrument: [5:Curette] [N/A:N/A] Bleeding: [5:Moderate] [N/A:N/A] Hemostasis Achieved: [5:Silver Nitrate] [N/A:N/A] Procedural Pain: [5:0] [N/A:N/A] Post Procedural Pain: [5:0] [N/A:N/A] Debridement Treatment [5:Procedure was tolerated] [N/A:N/A] Response: [5:well] Post Debridement [5:0.7x1x0.2] [N/A:N/A] Measurements L x W x D (cm) Post Debridement [5:0.11] [N/A:N/A] Volume:  (cm) Procedures Performed: [5:Debridement] [N/A:N/A] Treatment Notes Wound #5 (Left Metatarsal head fifth) 1. Cleanse With Wound Cleanser Soap and water 2. Periwound Care Skin Prep 3. Primary Dressing Applied Hydrofera Blue 4. Secondary Dressing Dry Gauze Foam 5. Secured With Medipore tape 7. Footwear/Offloading device applied Total Contact Cast Notes TCC applied by MD. Electronic Signature(s) Signed: 10/21/2019 6:32:44 PM By: Baltazar Najjarobson, Michael MD Signed: 10/22/2019 11:01:46 AM By: Yevonne PaxEpps, Carrie RN Entered By: Baltazar Najjarobson, Michael on 10/21/2019 16:00:17 -------------------------------------------------------------------------------- Multi-Disciplinary Care Plan Details Patient Name: Date of Service: [name], [name] 10/21/2019 1:30 PM Medical Record [id number] Patient Account Number: [id number] Date of Birth/Sex: Treating RN: January 20, 1946 (73 y.o. Melonie FloridaM) Epps, Carrie Primary Care Juanell Saffo: Selinda FlavinHoward, Kevin Other Clinician: Referring Taneal Sonntag: Treating Jaleya Pebley/Extender:[name], Peter CongoMichael Howard, Wadie LessenKevin Weeks in Treatment: 12 Active Inactive Wound/Skin Impairment Nursing Diagnoses: Knowledge deficit related to ulceration/compromised skin integrity Goals: Patient/caregiver will verbalize understanding of skin care regimen Date Initiated: 07/29/2019 Target Resolution Date: 11/07/2019 Goal Status: Active Ulcer/skin breakdown will have a volume reduction of 30% by week 4 Date Inactivated: 10/21/2019 Target12/08/2019 Resolution Date Initiated: 07/29/2019 Date: Unmet Reason: comorbities. Goal Status: Unmet blood flow Ulcer/skin breakdown will have a volume reduction of 50% by week 8 Date Initiated: 10/21/2019 Target Resolution Date: 11/21/2019 Goal Status: Active Interventions: Assess patient/caregiver ability to obtain necessary supplies Assess patient/caregiver ability to perform ulcer/skin care regimen upon admission and as needed Assess ulceration(s) every  visit Notes: Electronic Signature(s) Signed: 10/22/2019 11:01:46 AM By: Yevonne PaxEpps, Carrie RN Entered By: Yevonne PaxEpps, Carrie on 10/21/2019 14:14:42 -------------------------------------------------------------------------------- Pain Assessment Details Patient Name: Date of Service: [name], [name] 10/21/2019 1:30 PM Medical Record [id number] Patient Account Number: [id number] Date of Birth/Sex: Treating RN: January 20, 1946 [age] y.o. Tammy SoursM) Deaton, Bobbi Primary Care Sharmila Wrobleski: Selinda FlavinHoward, Kevin Other Clinician: Referring Ardyn Forge: Treating Kincaid Tiger/Extender:[name], Peter CongoMichael Howard, Wadie LessenKevin Weeks in Treatment: 12 Active Problems Location of Pain Severity and Description of Pain Patient Has Paino No Site Locations Rate the pain. Current Pain Level: 0 Pain Management and Medication Current Pain Management: Medication: No Cold Application: No Rest: No Massage: No Activity: No T.E.N.S.: No Heat Application: No Leg drop or elevation: No Is the Current Pain Management Adequate: Inadequate How does your wound impact your activities of daily livingo Sleep: No Bathing: No Appetite: No Relationship With Others: No Bladder Continence: No Emotions: No Bowel Continence: No Work: No Toileting: No Drive: No Dressing: No Hobbies: No Electronic Signature(s) Signed: 10/21/2019 5:14:15 PM By: Shawn Stalleaton, Bobbi Entered By: Shawn Stalleaton, Bobbi on 10/21/2019 14:26:22 -------------------------------------------------------------------------------- Patient/Caregiver Education Details Patient Name: Date of Service: [name], [name] 12/22/2020andnbsp1:30 PM Medical Record Patient Account Number: [id number] Number: Treating RN: Yevonne PaxEpps, Carrie Date of Birth/Gender: January 20, 1946 (73 y.o. Other Clinician: M) Treating Baltazar Najjarobson, Michael Primary Care Physician: Selinda FlavinHoward, Kevin Physician/Extender: Referring Physician: Joaquin BendHoward, Kevin Weeks in Treatment: 12 Education Assessment Education Provided To: Patient Education  Topics Provided Wound/Skin Impairment: Methods: Explain/Verbal Responses: State content correctly [employer]-Finch CompanyElectronic Signature(s) Signed: 10/22/2019 11:01:46  AM By: [name] RN Entered By: [name] on 10/21/2019 14:14:57 -------------------------------------------------------------------------------- Wound Assessment Details Patient Name: Date of Service: [name], [name] 10/21/2019 1:30 PM Medical Record [id number] Patient Account Number: [id number] Date of Birth/Sex: Treating RN: [birth year] (73 y.o. Lorette Ang, Meta.Reding Primary Care Alayjah Boehringer: [name] Other Clinician: Referring Dontray Haberland: Treating Keldrick Pomplun/Extender:[name], [name], [name] in Treatment: 12 Wound Status Wound Number: 5 Primary Diabetic Wound/Ulcer of the Lower Extremity Etiology: Wound Location: Left Metatarsal head fifth Wound Open Wounding Event: Gradually Appeared Status: Date Acquired: 07/14/2019 Comorbid Cataracts, Chronic sinus problems/congestion, Weeks Of Treatment: 12 History: Coronary Artery Disease, Hypertension, Clustered Wound: No Peripheral Arterial Disease, Type II Diabetes, Osteoarthritis, Neuropathy Photos Wound Measurements Length: (cm) 0.7 % Reduct Width: (cm) 1 % Reduct Depth: (cm) 0.2 Epitheli Area: (cm) 0.55 Tunneli Volume: (cm) 0.11 Undermi Wound Description Classification: Grade 1 Wound Margin: Thickened Exudate Amount: Medium Exudate Type: Serosanguineous Exudate Color: red, brown Wound Bed Granulation Amount: Medium (34-66%) Granulation Quality: Pink Necrotic Amount: Medium (34-66%) Necrotic Quality: Adherent Slough Foul Odor After Cleansing: No Slough/Fibrino Yes Exposed Structure Fascia Exposed: No Fat Layer (Subcutaneous Tissue) Exposed: Yes Tendon Exposed: No Muscle Exposed: No Joint Exposed: No Bone Exposed: No ion in Area: -773% ion in Volume: -746.2% alization: Medium (34-66%) ng: No ning: No Treatment Notes Wound #5 (Left Metatarsal  head fifth) 1. Cleanse With Wound Cleanser Soap and water 2. Periwound Care Skin Prep 3. Primary Dressing Applied Hydrofera Blue 4. Secondary Dressing Dry Gauze Foam 5. Secured With Medipore tape 7. Footwear/Offloading device applied Total Contact Cast Notes TCC applied by MD. Electronic Signature(s) Signed: 10/27/2019 4:06:01 PM By: Mikeal Hawthorne EMT/HBOT Signed: 10/27/2019 5:44:52 PM By: Deon Pilling Previous Signature: 10/21/2019 5:14:15 PM Version By: Deon Pilling Entered By: Mikeal Hawthorne on 10/27/2019 09:53:54 -------------------------------------------------------------------------------- Vitals Details Patient Name: Date of Service: [name], [name] 10/21/2019 1:30 PM Medical Record [id number] Patient Account Number: [id number] Date of Birth/Sex: Treating RN: 16-Jan-1946 (73 y.o. Lorette Ang, Meta.Reding Primary Care Jimie Kuwahara: [name] Other Clinician: Referring Kaelin Bonelli: Treating Jamye Balicki/Extender:[name], [name], [name] in Treatment: 12 Vital Signs Time Taken: 14:25 Temperature (F): 98 Height (in): 69 Pulse (bpm): 75 Weight (lbs): 210 Respiratory Rate (breaths/min): 20 Body Mass Index (BMI): 31 Blood Pressure (mmHg): 129/72 Capillary Blood Glucose (mg/dl): 150 Reference Range: 80 - 120 mg / dl Electronic Signature(s) Signed: 10/21/2019 5:14:15 PM By: Deon Pilling Entered By: Deon Pilling on 10/21/2019 14:26:10

## 2019-10-28 ENCOUNTER — Other Ambulatory Visit: Payer: Self-pay

## 2019-10-28 ENCOUNTER — Encounter (HOSPITAL_BASED_OUTPATIENT_CLINIC_OR_DEPARTMENT_OTHER): Payer: Medicare Other | Admitting: Internal Medicine

## 2019-10-28 DIAGNOSIS — E11621 Type 2 diabetes mellitus with foot ulcer: Secondary | ICD-10-CM | POA: Diagnosis not present

## 2019-10-28 NOTE — Progress Notes (Signed)
[name], [name] ([id number]) Visit Report for 10/28/2019 HPI Details Patient Name: Date of Service: [name], [name] 10/28/2019 12:30 PM Medical Record [id number] Patient Account Number: [id number] Date of Birth/Sex: [birth year] (73 y.o. M) Treating RN: Primary Care Provider: [name] Other Clinician: Referring Provider: Treating Provider/Extender:Saylah Ketner, [name], [name] in Treatment: 13 History of Present Illness HPI Description: 05/18/16; this is a 73year-old diabetic who is a type II diabetic on insulin. The history is that he traumatized his right foot developed a sore sometime in late March. Shortly thereafter he went on a cruise but he had to get off the cruise ship in [place] and fly urgently back to [place] where he was admitted to [facility] and ultimately underwent a transmetatarsal amputation by Dr. 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 According to the patient and his wife this wound never really healed. He was seen on 2 occasions in the wound care center in [place] and had vascular studies and then was referred urgently to Dr. Bridgett Larsson of vascular surgery. He underwent an angiogram on 05/10/16. Unfortunately nothing really could be done to improve his vascular status. He had a 75-90% stenosis in the midsegment of 1 segment of the posterior femoral artery. He had a patent popliteal, his anterior tibial occluded shortly after takeoff. Perineal had a greater than 90% stenosis posterior tibial is occluded feet had no distal collaterals feed distal aspect of the transmetatarsal amputation site. The patient tells me that he had a prolonged period of Santyl by Dr. Doran Durand was some initial improvement but then this was stopped. I think they're only applying daily dressings/dry dressings. He has not had a recent x-ray of the right foot he did have one before his surgery in April. His wife by the dimensions of the wound/surgical site being followed at home since  4/20. At that point the dimensions were 0.5 x 12 x 0.2 on 7/19 this was 1.8 x 6 x 0.4. He is not currently on any antibiotics. His hemoglobin A1c in early April was 12.9 at that point he was started on insulin. Apparently his blood sugars are much lower he has an appointment with Dr. Legrand Como Alteimer of endocrine next week. 05/29/16 x-ray of the area did not show osteomyelitis. I think he probably needs an MRI at this point. His wife is asking about something called"Yireh" cream which is not FDA approved. I have not heard of this. 06/22/16; MRI did not really suggest osteomyelitis. There was minimal marrow edema and enhancement in the stump of the second metatarsal felt to be secondary likely to postoperative change rather than osteomyelitis. The patient has arranged his own consultation with Dr. Andree Elk at [place], apparently their daughter lives in [place] and has some connection here. Any improvement in vascular supply by Dr. Andree Elk would of course be helpful. Dr. Bridgett Larsson did not feel that anything further could be done other than amputation if wound care did not result in healing or if the area deteriorates. 06/26/16; the patient has been to see Dr. Andree Elk at [place] and had an angiogram. He is going for a procedure on Thursday which will involve catheterization. I'm not sure if this is an anterograde or retrograde approach. He has been using Santyl to the wound 07/10/16; the patient had a repeat angiogram and angioplasty at [place] by Dr. Brunetta Jeans. His angiogram showed right CFA and profundal widely patent. The right as of a.m. popliteal artery were widely patent the right anterior tibial was occluded proximally and reconstitutes at the ankle.  Peroneal artery was patent to the foot. Posterior tibial artery was occluded. The patient had angioplasty of the anterior tibial artery.. This was quite successful. He was recommended for Plavix as well as aspirin. 07/17/16; the patient was close to be a nurse visit today  however the outer dressing of the Apligraf fell off. Noted drainage. I was asked to see the wound. The patient is noted an odor however his wife had noted that. Drainage with Apligraf not necessarily a bad thing. He has not been systemically unwell 07/24/16; we are still have an issue with drainage of this wound. In spite of this I applied his second Apligraf. Medially the area still is probing to bone. 08/07/16; Apligraf reapplied in general wound looks improved. 08/21/16 Apligraf #4. Wound looks much better 09/04/16 patientt's wound again today continues to appear to improve with the application of the Apligraf's. He notes no increased discomfort or concerns at this point in time. 09/18/16; the patient returns today 2 weeks after his fifth application of Apligraf. Predictably three quarters of the width of this wound has healed. The deep area that probe to bone medially is still open. The patient asked how much out-of-pocket dollars would be for additional Apligraf's. 09/25/16; now using Hydrofera Blue. He has completed 5 Apligraf applications with considerable improvement in this deep open transmetatarsal amputation site. His wound is now a triangular-shaped wound on the medial aspect. At roughly 12 to 2:00 this probes another centimeter but as opposed to in the past this does not probe to bone. The patient has been seen at [facility] by Dr. Zenia Resides. He is not planning to do any more revascularization unless the wound stalls or worsens per the patient 10/02/16; 0.7 x 0.8 x 0.8. Unfortunately although the wound looks stable to improved. There is now easily probable bone. This hasn't been present for several weeks. Patient is not otherwise symptomatic he is not experiencing any pain. I did a culture of the wound bed 10/09/16. Deterioration last week. Culture grew MRSA and although there is improvement here with doxycycline prescribed over the phone I'm going to try to get him linezolid 600 twice a day for  10 days today. 10/16/16; he is completing a weeks worth of linezolid and still has 3 more days to go. Small triangular-shaped open area with some degree of undermining. There is still palpable bone with a curet. Overall the area appears better than last week 10/20/16 he has completed the linezolid still having some nausea and vomiting but no diarrhea. He has exposed bone this week which is a deterioration. 10/27/16 patient now has a small but probing wound down to bone. Culture of this bone that I did last week showed a few methicillin-resistant staph aureus. I have little doubt that this represents acute/subacute osteomyelitis. The patient is currently on Doxy which I will continue he also completed 10 days of linezolid. We are now in a difficult situation with this patient's foot after considerable discussion we will send him back to see Dr. Doran Durand for a surgical opinion of this I'm also going to try to arrange a infectious disease consult at [facility] hopefully week and get this prior to her usual 4-6 weeks we having [place]. The patient clearly is going to need 6 weeks of IV vancomycin. If we cannot arrange this expediently I'll have to consider ordering this myself through a home infusion company 11/03/16; the patient now has a small in terms of circumference but probing wound. No bone palpable today. The patient remains  on doxycycline 100 twice a day which should support him until he sees infectious disease at [facility] next week the following week on Wednesday I believe he has an appointment with Dr. Andree Elk at [facility] who is his vascular cardiologist. Finally he has an appointment with Dr. Doran Durand on 11/22/16 we have been using silver alginate. The patient's wife states they are having trouble getting this through [facility] 11/13/16; the patient was seen by infectious disease at [facility] in the 11th PICC line placed in preparation for IV antibiotics. A tummy he has not going to get IV vancomycin o  Ceftaroline. They've also ordered an MRI. Patient has a follow-up with Dr. Doran Durand on 11/22/16 and Dr. Andree Elk at [facility] tomorrow 11/23/16 the patient is on daptomycin as directed by infectious disease at [facility]. He is also been back to see Dr. Andree Elk at [facility]. He underwent a repeat arteriogram. He had a successful PTA of the right anterior tibial artery. He is on dual antiplatelete treatment with Plavix and aspirin. Finally he had the MRI of his foot in [place]. This showed cellulitis about the foot worse distally edema and enhancement in the reminiscent of the second metatarsal was consistent with osteomyelitis therefore what I was assuming to be the first metatarsal may be actually the second. He also has a fluid collection deep to the calcaneus at the level of the calcaneal spur which could be an abscess or due to adventitial bursitis. He had a small tear in his Achilles 11/30/16; the patient continues on daptomycin as directed by infectious disease at [facility]. He is been revascularized by Dr. Andree Elk at [facility] [place] in [place]. He has been to see Gretta Arab who was the orthopedic surgeon who did his original amputation. I have not seen his not however per the patient's wife he did not offer another surgical local surgical prodecure to remove involved bone. He verbalized his usual disbelief in not just hyperbarics but any medical therapy for this condition(osteomyelitis). I discussed this in detail with the patient today including answering the question about a BKA definitively "curing" the current condition. 12/07/16; the patient continues on daptomycin as directed by infectious disease at [facility]. This is directed at the MRSA that we cultured from his bone debridement from 12/22. Lab work today shows a white count of 8.7 hemoglobin of 10.5 which is microcytic and hypochromic differential count shows a slightly elevated monocyte count at 1.2 eosinophilic count of 0.6. His creatinine is  1.11 sedimentation rate apparently is gone from 35-34 now 40. I explained was wife I don't think this represents a trend. His total CK is 48 12/14/16- patient is here for follow-up evaluation of his right TMA site. He continues to receive IV daptomycin per infectious disease. His serum inflammatory markers remain elevated. He complains of intermittent pain to the medial aspect of the TMA site with intermittent erythema. He voices no complaints or concerns regarding hyperbaric therapy. Overall he and his wife are expressing a frustration and discouragement regarrding the length of time of treatment. 12/21/16; small open wound at roughly the first or second metatarsal metatarsalphalyngeal joint reminiscence of his transmetatarsal amputation site he continues to receive IV daptomycin per infectious disease at [facility]. He will finish these a week tomorrow. He has lab work which I been copied on. His white count is 10.8 hemoglobin 8.9 MCV is low at 75., MCH low at 24.5 platelet count slightly elevated at 626. Differential count shows 70% neutrophils 10% monocytes and 10% eosinophils. His comprehensive metabolic  panel shows a slightly low sodium at 133 albumin low at 3.1 total CK is normal at 42 sedimentation rate is much higher at 82. He has had iron studies that show a serum iron of 15 and iron binding capacity of 238 and iron saturation of 6. This is suggestive of iron deficiency. B12 and folate were normal ferritin at 113 The patient tells me that he is not eating well and he has lost weight. He feels episodically nauseated. He is coughing and gagging on mucus which she thinks is sinusitis. He has an appointment with his primary doctor at 5:00 this afternoon in [facility] 01/02/17; the patient developed a subacute pneumonitis. He was admitted to [facility] after a CT scan showed an extensive interstitial pneumonitis [I have not yet seen this]. 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 He has since been discharged. He is not on oxygen. He feels fatigued and very short of breath with exertion. At [facility] the wound care nurse there felt that his wound was healed. He did complete his daptomycin and has follow-up with infectious disease on Friday. X-rays I did before he went to [facility] still suggested residual osteomyelitis in the anterior aspect of the second must metatarsal head. I'm not sure I would've expected any different. There was no other findings. I actually think I did this because of erythema over the first metatarsal head reminiscent 01/11/17; the patient has eosinophilic pneumonitis. He has been reviewed by pulmonology and given clearance for hyperbarics at least that's what his wife says. I'll need to see if there is note in care everywhere. Apparently the prognosis for improvement of daptomycin induced eosinophilic granulocyte is is 3 months without steroids. In the meantime infectious disease has placed him on doxycycline until the wound is closed. He is still is lost a lot of weight and his blood sugars are running in the mid 60s to low 80s fasting and at all times during the day 01/18/17; he has had adjustments in his insulin apparently his blood sugars in the morning or over 100. He wants to restart his hyperbaric treatment we'll do this at 1:00. 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 I think there is good reason to complete his treatments in order to give him the best chance of maintaining a healed status and these DFU 3 wounds with MRSA infection in the bone 02/15/17; the patient was seen today in conjunction with HBO. He completed hyperbaric oxygen today. The open area on his transmetatarsal site has remained closed. There was an area of erythema when I saw him earlier in  the week on the posterior heel although that is resolved as of today as well. He has been using a cam walker. This is a patient who came to [place] after a transmetatarsal amputation that was necrotic and dehisced. He required revascularization percutaneously on 2 different occasions by Dr. Andree Elk of invasive cardiology at [facility]. 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 He went to [facility] infectious disease and had IV daptomycin for 5 weeks before developing eosinophilic pneumonitis and requiring an admission to hospital/ICU. He made a good recovery and is continued on doxycycline since. As mentioned his foot is closed now. He has a follow-up with Dr. Andree Elk tomorrow. He is going to [employer]  on Monday for a custom-made shoe READMISSION Last visit Dr. Andree Elk [facility] 02/16/17 1. Critical limb ischemia of the RLE: s/p transmetatarsal amputation of the RLE, now completely healed. s/p right ATA percutaneous revascularization procedure x2, most recently 11/20/2016 s/p PTA of right AT 100% to less than 20% with a 2.5 x 200 balloon. He does have some residual osteomyelitis, but given the wound is closed, the orthopedist has recommended to follow. He has a fitting for a special shoe coming up next week. He has completed a course of daptomycin and doxycycline and he has been discharged by ID. He has completed a course of hyperbaric therapy. Continue Continue medical management with aspirin, Plavix and statin therapy. We will discuss ongoing Plavix therapy at follow-up in 6 months. 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 Given no symptoms, we will conservatively manage. He doesn't want to do any more invasive studies at this time, which is reasonable. The patient arrives today  out of 2 concerns both on the right transmetatarsal site. 1 at the level of the reminiscent fifth metatarsal head and the other at roughly the first or second. Both of these look like dark subcutaneous discoloration probably subdermal bleeding. He is recently obtained new adaptive footwear for the right foot. He also has a callus on the left fifth dorsal toe however he follows with podiatry for this and I don't think this is any issue. ABIs in this clinic today were 0.66 on the right and 1.06 on the left. On entrance into our clinic initially this was 0.95 and 0.92. He does not describe current claudication. They're going away on a cruise in 8 weeks and I think are trying to do the month is much as they can proactively. They follow with podiatry and have an appointment with Dr. Andree Elk in October READMISSION 06/25/18 This is a patient that we have not seen in almost a year. He is a type II diabetic with known PAD. He is followed by Dr. Andree Elk of interventional cardiology at [facility] in [place]. He is required revascularization for significant PAD. When we first saw him he required a transmetatarsal amputation. He had underlying osteomyelitis with a nonhealing surgical wound. This eventually closed with wound care, IV antibiotics and hyperbaric oxygen. They tell me that he has a modified shoe and he is very active walking up to 4 miles a day. He is followed by Dr. Geroge Baseman of podiatry. His wife states that he underwent a removal of callus over this site on July 9. She felt there may be drainage from this site after that although she could never really determined and there was callus buildup again. On 06/01/18 there was pressure bleeding through the overlying callus. he was given a prescription for 7 days of Bactrim. Fortuitously he has an appointment with Dr. Andree Elk on 06/04/18 and he immediately underwent revascularization of the right leg although I have not had a chance to review these records  in care everywhere. This was apparently done through anterior and retrograde access. On 06/06/18 he had another debridement by Dr. Bernette Mayers. Vitamin soaking with Epsom salts for 20 minutes and applying calcium alginate. The original trans-met was on April 2017 I believe by Dr. Doran Durand. His ABI in our clinic was noncompressible today. 07/02/18; x-ray I ordered last week was negative for osteomyelitis. Swab culture was also negative. He is going to require an  MRI which I have ordered today. 07/09/18; surprisingly the MRI of the foot that I ordered did not show osteomyelitis. He did suggest the possibility of cellulitis. For this reason I'll go ahead and give him a 10 day course of doxycycline. Although the previous culture of this area was negative 07/16/18; it arrives with the wound looking much the same. Roughly the same depth. He has thick subcutaneous tissue around the wound orifice but this still has roughly the same depth. Been using silver alginate. We applied Oasis #1 today 07/23/2018; still having to remove a lot of callus and thick subcutaneous tissue to actually define the wound here. Most of this seems to have closed down yet he has a comma shaped divot over the top of the area that still I think is open. We applied Oasis #2 His wife expressed concern about the tip of his left great toe. This almost looks like a small blister. She also showed it today to her podiatrist Dr. Geroge Baseman who did not think this was anything serious. I am not sure is anything serious either however I think it bears some watching. He is not in any pain however he is insensate 07/30/2018;; still on a lot of nonviable tissue over the surface of the wound however cleaning this up reveals a more substantial wound orifice but less of a probing wound depth. This is not probed to bone. There is no evidence of infection The wife is still concerned about a non-open area on the tip of his left great toe. Almost feels like a bony  outgrowth. She had previously showed this to podiatry. I do not think this is a blister. A friction area would be possible although he is really not walking according to his wife. He had a small skin tag on his right buttock but no open wound here either 08/06/2018; we applied a third Oasis last week. Unfortunately there is really no improvement. Still requiring extensive debridement to expose the wound bed from a horizontal slitlike depression. I still have not been able to get this to fill in properly. On the positive side there is now no probable bone from when he first came into the facility. I changed him to silver alginate today after a reasonably aggressive debridement 08/13/2018; once again the patient comes in with skin and subcutaneous tissue closing over the small probing area with the underlying cavity of the wound on the right TMA site. I applied silver alginate to this last week. Prior to that we used Oasis x3 still not able to get this area granulating. He does not have a probing area of the bone which is an improvement from when I spur started working on this and an MRI did not suggest osteomyelitis. I do not see evidence of infection here but I am increasingly concerned about why I cannot get this area to granulate. Each time I debrided this is looks like this is simply a matter of getting granulation to fill in the hole and then getting epithelialization. This does not seem to happen Also I sent him back to see podiatry Dr. Earleen Newport about the what felt to be bony outgrowth on the tip of his left great toe. Apparently after heel he left the clinic last week or the next day he developed a blood blister. He did see Dr. Earleen Newport. He went on to have a debridement of the medial nail cuticle he now has an open area here as well as some denuded skin. They did an  x-ray apparently does have a bony outgrowth or spur but I am not able to look at this. They are using topical antibiotics apparently  there was some suggested he use Santyl. Patient's wife was anxious for my opinion of this 08/20/2018; we are able to keep the wound open this time instead of the thick subcutaneous tissue closing over the top of it however unfortunately once again this probes to bone. I do not see any evidence of infection and previous MRI did not show osteomyelitis. I elected to go back to the Oasis to see if we can stimulate some granulation. Last saw his vascular interventional cardiologist Dr. Andree Elk at the beginning of August and he had a repeat procedure. Nevertheless I wonder how much blood flow he has down to this area With regards to the left first toe he is seeing Dr. Earleen Newport next week. They are applying Bactroban to this area. 08/27/2018 Once again he comes in with thick eschar and subcutaneous tissue over the top of the small probing hole. This does not appear to go down to bone but it still has roughly the same depth. I reapplied Oasis today Over the left great toe there appears to be more of the wound at the tip of his toe than there was last week. This has an eschar on the surface of it. Will change to Santyl. There is seeing podiatry this afternoon 09/03/2018 He comes in today with the area on the transmetatarsal's site with a fair amount of callus, nonviable tissue over the circumference but it was not closed. I removed all of this as well as some subcutaneous debris and reapplied Oasis. There is no exposed bone The area over the tip of the left great toe started off as a nodule of uncertain etiology. He has been followed with podiatry. They have been removing part of the medial nail bed. He has nonviable tissue over the wound he been using Santyl in this area 09/10/18 Unfortunately comes in with neither wound area looking improved. The transmetatarsal amputation site once Again has nonviable debris over the surface requiring debridement. Unfortunately underneath this there is nothing that looks  viable and this once again goes right down to bone. There is no purulent drainage and no erythema. Also the surgical wound from podiatry on the left first toe has an ischemic-looking eschar over the surface of the tip of the toe I have elected not to attempt candidly debride this His wife as arranged for him to have follow-up noninvasive studies in [place] under the care of Dr. Andree Elk clinic. The question is he has known severe PAD. They had recently seen him in August. He has had revascularizations in both legs within the last 4 or 5 months. He is not complaining of pain and I cannot really get a history of claudication. He has not systemically unwell 09/20/2018 the patient has been to [facility] and been revascularized by Dr. Andree Elk earlier this week. Apparently he was able to open up the anterior tibial artery although I have not actually seen his formal report. He is going for an attempt to revascularize on the left on Monday. He is apparently working with an investigational stent for lower extremity arteries below the knee and he has talked to the patient about placing that on Monday if possible. The patient has been using silver alginate on the transmetatarsal amputation site and Santyl on the left 09/30/2018; patient had his revascularization on the left this apparently included a standard approach as well  as a more distal arterial catheterization although I do not have any information on this from Dr. Andree Elk. In fact I do not even see the initial revascularization that he had on the right. I have included the arterial history from Dr. Andree Elk last note however below; ASSESSMENT/PLAN: 1. Hx of Critical limb ischemia bilateral lower extremities, PAD: -s/p transmetatarsal amputation of the RLE. -s/p right ATA percutaneous revascularization procedure x2, most recently 11/20/2016 s/p PTA of right AT 100% to less than 20% with a 2.5 x 200 balloon. -On original angiogram 05/15/2016, he had a  significant 70-95% left popliteal artery stenosis with AT and PT artery occlusions and one vessel runoff via the peroneal artery. -03/21/2018 s/p PTA of 90% left popliteal artery to <10% with a 5x20 cutting balloon, PTA of 90% left peroneal to <20% with a 3x20 balloon, PTA of 100% left AT to <20% with a 2.5x220 balloon. 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 Will plan on the LLE soon after. Risks/benefits of procedure have been discussed and patient has elected to proceed. We have been using endoform to the right TMA amputation site wound and Santyl to the left great toe 10/07/2018; the area on the tip of his left great toe looked better we have been using Santyl here. We continue to have a very difficult probing hole on the right TMA amputation site we have been using endoform. I went on to use his fifth Oasis today 10/14/2018; the tip of the left great toe continues to look better. We have been using Santyl here the surface however is healthy and I think we can change to an alginate. The right TMA has not changed. Once again he has no superficial opening there is callus and thick subcutaneous tissue over the orifice once you remove this there is the probing area that we have been dealing with without too much change. There is no palpable bone I have been placing Oasis here and put Oasis #6 in this today after a more vigorous debridement 10/21/18; the left great toe still has necrotic surface requiring debridement. The right TMA site hasn't changed in view of the thick callus over the wound bed. With removal of this there is still the opening however this does not appear to have the same depth. Again there is no palpable bone. Oasis was replaced 10/28/2018; patient comes in with both wounds looking worse. The area over the first toe tip is now down  to bone. The area over the TMA site is deeper and down to bone clearly with a increase in overall wound area. Equally concerning on the right TMA is the complete absence of a pulse this week which is a change. We are not even able to Doppler this. On the right he has a noncompressible ABI greater than 1.4 11/04/2018. Both wounds look somewhat worse. X-rays showed no osteomyelitis of the right foot but on the left there was underlying osteomyelitis in the left great toe distal phalanx. I been on the phone to Dr. Andree Elk surface at [facility] in [place] and they are arranging for another angiogram on the right on Monday. I have him on doxycycline for the osteomyelitis in the left great toe for now. Infectious disease may be necessary 1/17; 2-week hiatus. Patient was admitted to hospital at [place]. My understanding is he underwent an angioplasty of the right  anterior tibial artery and had stents placed in the left anterior artery and the left tibial peroneal trunk. This was done by Dr. Andree Elk of interventional radiology. There is no major change in either 1 of the wounds. They have been using Aquacel Ag. As far as they are aware no imaging studies were done of the foot which is indeed unfortunate. I had him on doxycycline for 2 weeks since we identified the osteomyelitis in the left great toe by plain x-ray. I have renewed that again today. I am still suspicious about osteomyelitis in the amputation site and would consider doing another MRI to compare with the one done in September. The idea of hyperbaric oxygen certainly comes up for discussion 1/24; no major change in either wound area. I have him on doxycycline for osteomyelitis at the tip of the left great toe. As noted he has been previously and recently revascularized by Dr. Andree Elk at [place]. He is tolerating the doxycycline well. For some reason we do not have an infectious disease consult yet. Culture of drainage from the right foot site last week was  negative 1/31; MRI of the right foot did not show osteomyelitis of the right ankle and foot. Notable for a skin ulceration overlying the second metatarsal stump with generalizing soft tissue edema of the ankle and foot consistent with cellulitis. Noted to have a partial-thickness tear of the Achilles tendon 7.5 cm proximal to the insertion Nothing really new in terms of symptoms. Patient's area on the tip of the left great toe is just about closed although he still has the probing area on the metatarsal amputation site. Appointment with Dr. Linus Salmons of infectious disease next week 2/7; Dr. Novella Olive did not feel that any further antibiotics were necessary he would follow-up in 2 months. The left great toe appears to be closed still some surface callus that I gently looked under high did not see anything open or anything that was threatening to be open. He still has the open area on the mid part of his TMA site using endoform 2/14; left great toe is closed and he is completing his doxycycline as of last Sunday. He is not on any antibiotics. Unfortunately out of the right foot his wife noticed some subdermal hemorrhage this week. He had been walking 2 miles I had given him permission to do so this is not really a plantar wound. Using endoform to this wound but I changed to silver alginate this week 2/21; left great toe remains closed. Culture last week grew Streptococcus angiosis which I am not really familiar with however it is penicillin sensitive and I am going to put him on Augmentin. Not much change in the wound on the right foot the deep area is still probing precariously close to bone and the wound on the margin of the TMA is larger. 2/28; left great toe remains closed. He is completing the Augmentin I gave him last week. 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 This is being shown to Dr. Andree Elk at [facility] to see if there is anything else that can be done here. He  has been using silver alginate strips on the right 3/6; left great toe remains closed. He sees Dr. Andree Elk on Monday. The area on the plantar aspect of the right foot has the same small orifice with thick callused tissue around this. However this time with removal of the callus tissue the wound is open all the way along the incision line  to the end medially. We have been using silver alginate. 3/13; left toe remains closed although the area is callused. He sees Dr. Jacqualyn Posey of podiatry next week. The area on the plantar right foot looked a lot better this week. Culture I did of this was negative we use silver alginate. He is going next week for an attempt at revascularization by Dr. Andree Elk 3/23; left toe remains closed although the area is callused. He will see Dr. Jacqualyn Posey in follow-up. The area on the right plantar foot continues to look surprisingly better over the last 3 visits. We have been using silver alginate. The revascularization he was supposed to have by Dr. Andree Elk at [facility] in [place] has been canceled [facility] procedure] 4/6; the right foot remains closed albeit callused. Podiatry canceled the appointment with regards to the left great toe. He has not seen Dr. Andree Elk at [facility] but thinks that Dr. Andree Elk has "done all he can do". He would be a candidate for the hemostaemix trial Readmission 07/29/2019 Mr. [name] is a man we know well from at least 3 previous stays in this clinic. He is a type II diabetic with severe PAD followed by Dr. Andree Elk at [facility] in [place]. 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 So far everything in both of these areas has remained closed. About 2 weeks ago he went to see his podiatrist at [facility] Dr. Babs Bertin. He had a thick callus on the left fifth metatarsal head that was shaved. He has developed an open wound in this  area. They have been offloading this in his diabetic shoes. The patient has not had any more revascularizations by Dr. Andree Elk since the last time he was here. ABI in our clinic at the posterior tibial on the left was 1.06 10/6; no real change in the area on the plantar met head. Small wound with 2 mm of depth. He has thick skin probably from pressure around the wound. We have been using silver alginate 10/13; small wound in the left fifth plantar met head. Arrives today with undermining laterally and purulent drainage. Our intake nurse cultured this. His wife stated they noticed a change in color over the last day or 2. He is not systemically unwell 10/19; small wound on the fifth plantar metatarsal head. Culture I did last week showed Staphylococcus lugdunensis. Although this could be a skin contaminant the possibility of a skin and soft tissue infection was there. I did give him empiric doxycycline which should have covered this. We are using silver alginate to the wound. We put him in a total contact cast today. 10/22; small wound on the fifth plantar metatarsal head. He has completed antibiotics. He also has severe PAD which worries me about just about any wound on this man's foot 10/29; small superficial area on the fifth plantar metatarsal head. Measuring slightly smaller. He is not currently on any antibiotics. I been using a total contact cast in this man with very severe PAD but he seems to be tolerating this well 11/5; left plantar fifth metatarsal head. Silver alginate being used under a total contact cast 11/12; wound not much different than last week. Using a #15 scalpel debridement around the wound. Still silver collagen under a total contact cast. He has severe PAD and that may be playing a role in this 11/19;  disappointing that the wound is not really changed that much. I think it is come down in overall surface area because originally this was on the lateral part of the fifth  metatarsal head however recently it is not really changed. Most of this is filled in but it will not epithelialized there is surface debris on this which may be mostly related to ischemia. They have an appointment with Dr. Andree Elk on 12/9 09/24/2019 on evaluation today patient appears to be doing somewhat better with regard to the wound on the left fifth metatarsal head. Fortunately there does not appear to be any signs of active infection at this time. No fevers, chills, nausea, vomiting, or diarrhea. The wound does not appear to be completely closed and I do feel like the Hydrofera Blue is helping to some degree although I feel like the cast as well as keeping things from breaking down or getting any larger at least. As far as his wound overview it shows that the overall size of the wound is slightly smaller but really maintaining within the realm of about the same. He had no troubles with the cast which is good news. 12/1; left fifth metatarsal head. Perhaps somewhat more vibrant. We have been using Hydrofera Blue. He sees Dr. Andree Elk at [facility] 1 week tomorrow. He be back next week and I hope to have a better idea which way the wound is going however I will not cast him next week in case Dr. Andree Elk wants to reevaluate things in his foot. The left leg is the leg with the stent in place and I wonder whether these are going to have to be reevaluated. 12/8; left fifth metatarsal head. Quite a bit better this week. Only a small open area remains. He sees Dr. Andree Elk at [facility] tomorrow. Dr. Andree Elk is previously done his vascular interventions. He has 2 stents in the left leg as I remember things. I therefore will not put him in a total contact cast today. He will be using a forefoot off loader. We will use silver collagen on the wound. We will see him again next week 12/15; left fifth metatarsal head. He saw Dr. Andree Elk and is scheduled for an angiogram on Thursday. Unfortunately although his wound was a lot better  last week it is really deteriorated this week. Small punched-out hole with depth and overhanging tissue. We have been using Hydrofera Blue 12/22; patient had an 80% stenosis proximal to the stent I believe in the below-knee popliteal artery. This was opened by Dr. Andree Elk. According the patient the stent itself was satisfactory. I have not been able to review this note. 12/29; patient arrives today with the wound about the same size some undermining medially. We have been using Hydrofera Blue under a total contact cast and that is what we will do again today Electronic Signature(s) Signed: 10/28/2019 6:15:30 PM By: [name] MD Entered By: [name] on 10/28/2019 13:41:01 -------------------------------------------------------------------------------- Physical Exam Details Patient Name: Date of Service: [name], [name] 10/28/2019 12:30 PM Medical Record [id number] Patient Account Number: [id number] Date of Birth/Sex: 01-16-1946 (73 y.o. M) Treating RN: Primary Care Provider: [name] Other Clinician: Referring Provider: Treating Provider/Extender:[name], [name], [name] in Treatment: 13 Constitutional Sitting or standing Blood Pressure is within target range for patient.. Pulse regular and within target range for patient.Marland Kitchen Respirations regular, non-labored and within target range.. Temperature is normal and within the target range for the patient.Marland Kitchen Appears in no distress. Cardiovascular Dorsalis pedis pulses now present.. Notes Wound  exam; no debridement is required some slough on the surface of the wound removed with Anasept and gauze. There is no surrounding infection Electronic Signature(s) Signed: 10/28/2019 6:15:30 PM By: [name] MD Entered By: [name] on 10/28/2019 13:41:49 -------------------------------------------------------------------------------- Physician Orders Details Patient Name: Date of Service: [name], [name]  10/28/2019 12:30 PM Medical Record [id number] Patient Account Number: [id number] Date of Birth/Sex: [birth year] (73 y.o. M) Treating RN: [name] Primary Care Provider: [name] Other Clinician: Referring Provider: Treating Provider/Extender:[name], [name], [name] in Treatment: 13 Verbal / Phone Orders: No Diagnosis Coding ICD-10 Coding Code Description E11.621 Type 2 diabetes mellitus with foot ulcer E11.51 Type 2 diabetes mellitus with diabetic peripheral angiopathy without gangrene L97.521 Non-pressure chronic ulcer of other part of left foot limited to breakdown of skin L03.116 Cellulitis of left lower limb Follow-up Appointments Return Appointment in 1 week. Dressing Change Frequency Wound #5 Left Metatarsal head fifth Change Dressing every other day. Wound Cleansing Wound #5 Left Metatarsal head fifth May shower and wash wound with soap and water. Primary Wound Dressing Wound #5 Left Metatarsal head fifth Hydrofera Blue - READY Other: - pad right great toe for protection Off-Loading Total Contact Cast to Left Lower Extremity Electronic Signature(s) Signed: 10/28/2019 5:43:27 PM By: Todd Coria RN Signed: 10/28/2019 6:15:30 PM By: [name] MD Entered By: [name] on 10/28/2019 13:24:58 -------------------------------------------------------------------------------- Problem List Details Patient Name: Date of Service: [name], [name] 10/28/2019 12:30 PM Medical Record [id number] Patient Account Number: [id number] Date of Birth/Sex: Jan 07, 1946 (73 y.o. M) Treating RN: [name] Primary Care Provider: [name] Other Clinician: Referring Provider: Treating Provider/Extender:Vanita Cannell, [name], [name] in Treatment: 13 Active Problems ICD-10 Evaluated Encounter Code Description Active Date Today Diagnosis E11.621 Type 2 diabetes mellitus with foot ulcer 07/29/2019 No Yes E11.51 Type 2 diabetes mellitus with  diabetic peripheral 07/29/2019 No Yes angiopathy without gangrene L97.521 Non-pressure chronic ulcer of other part of left foot 07/29/2019 No Yes limited to breakdown of skin L03.116 Cellulitis of left lower limb 08/12/2019 No Yes Inactive Problems Resolved Problems Electronic Signature(s) Signed: 10/28/2019 6:15:30 PM By: [name] MD Entered By: [name] on 10/28/2019 13:39:53 -------------------------------------------------------------------------------- Progress Note Details Patient Name: Date of Service: [name], [name] 10/28/2019 12:30 PM Medical Record [id number] Patient Account Number: [id number] Date of Birth/Sex: 05-20-1946 (73 y.o. M) Treating RN: Primary Care Provider: [name] Other Clinician: Referring Provider: Treating Provider/Extender:[name], [name], [name] in Treatment: 13 Subjective History of Present Illness (HPI) 05/18/16; this is a 73year-old diabetic who is a type II diabetic on insulin. The history is that he traumatized his right foot developed a sore sometime in late March. Shortly thereafter he went on a cruise but he had to get off the cruise ship in [place] and fly urgently back to [place] where he was admitted to [facility] and ultimately underwent a transmetatarsal amputation by Dr. 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 According to the patient and his wife this wound never really healed. He was seen on 2 occasions in the wound care center in [place] and had vascular studies and then was referred urgently to Dr. Bridgett Larsson of vascular surgery. He underwent an angiogram on 05/10/16. Unfortunately nothing really could be done to improve his vascular status. He had a 75-90% stenosis in the midsegment of 1 segment of the posterior femoral artery. He had a patent popliteal, his anterior tibial occluded shortly after takeoff. Perineal had a greater than 90% stenosis posterior tibial is occluded feet had no distal  collaterals feed distal aspect of the transmetatarsal amputation site. The patient tells me that he had a prolonged period of Santyl by Dr. Doran Durand was some initial improvement but then this was stopped. I think they're only applying daily dressings/dry dressings. He has not had a recent x-ray of the right foot he did have one before his surgery in April. His wife by the dimensions of the wound/surgical site being followed at home since 4/20. At that point the dimensions were 0.5 x 12 x 0.2 on 7/19 this was 1.8 x 6 x 0.4. He is not currently on any antibiotics. His hemoglobin A1c in early April was 12.9 at that point he was started on insulin. Apparently his blood sugars are much lower he has an appointment with Dr. Legrand Como Alteimer of endocrine next week. 05/29/16 x-ray of the area did not show osteomyelitis. I think he probably needs an MRI at this point. His wife is asking about something called"Yireh" cream which is not FDA approved. I have not heard of this. 06/22/16; MRI did not really suggest osteomyelitis. There was minimal marrow edema and enhancement in the stump of the second metatarsal felt to be secondary likely to postoperative change rather than osteomyelitis. The patient has arranged his own consultation with Dr. Andree Elk at [facility], apparently their daughter lives in [place] and has some connection here. Any improvement in vascular supply by Dr. Andree Elk would of course be helpful. Dr. Bridgett Larsson did not feel that anything further could be done other than amputation if wound care did not result in healing or if the area deteriorates. 06/26/16; the patient has been to see Dr. Andree Elk at [facility] and had an angiogram. He is going for a procedure on Thursday which will involve catheterization. I'm not sure if this is an anterograde or retrograde approach. He has been using Santyl to the wound 07/10/16; the patient had a repeat angiogram and angioplasty at [facility] by Dr. Brunetta Jeans. His angiogram showed right CFA  and profundal widely patent. The right as of a.m. popliteal artery were widely patent the right anterior tibial was occluded proximally and reconstitutes at the ankle. Peroneal artery was patent to the foot. Posterior tibial artery was occluded. The patient had angioplasty of the anterior tibial artery.. This was quite successful. He was recommended for Plavix as well as aspirin. 07/17/16; the patient was close to be a nurse visit today however the outer dressing of the Apligraf fell off. Noted drainage. I was asked to see the wound. The patient is noted an odor however his wife had noted that. Drainage with Apligraf not necessarily a bad thing. He has not been systemically unwell 07/24/16; we are still have an issue with drainage of this wound. In spite of this I applied his second Apligraf. Medially the area still is probing to bone. 08/07/16; Apligraf reapplied in general wound looks improved. 08/21/16 Apligraf #4. Wound looks much better 09/04/16 patientt's wound again today continues to appear to improve with the application of the Apligraf's. He notes no increased discomfort or concerns at this point in time. 09/18/16; the patient returns today 2 weeks after his fifth application of Apligraf. Predictably three quarters of the width of this wound has healed. The deep area that probe to bone medially is still open. The patient asked how much out-of-pocket dollars would be for additional Apligraf's. 09/25/16; now using Hydrofera Blue. He has completed 5 Apligraf applications with considerable improvement in this deep open transmetatarsal amputation site. His wound is now a triangular-shaped wound  on the medial aspect. At roughly 12 to 2:00 this probes another centimeter but as opposed to in the past this does not probe to bone. The patient has been seen at [facility] by Dr. Zenia Resides. He is not planning to do any more revascularization unless the wound stalls or worsens per the patient 10/02/16; 0.7 x 0.8 x  0.8. Unfortunately although the wound looks stable to improved. There is now easily probable bone. This hasn't been present for several weeks. Patient is not otherwise symptomatic he is not experiencing any pain. I did a culture of the wound bed 10/09/16. Deterioration last week. Culture grew MRSA and although there is improvement here with doxycycline prescribed over the phone I'm going to try to get him linezolid 600 twice a day for 10 days today. 10/16/16; he is completing a weeks worth of linezolid and still has 3 more days to go. Small triangular-shaped open area with some degree of undermining. There is still palpable bone with a curet. Overall the area appears better than last week 10/20/16 he has completed the linezolid still having some nausea and vomiting but no diarrhea. He has exposed bone this week which is a deterioration. 10/27/16 patient now has a small but probing wound down to bone. Culture of this bone that I did last week showed a few methicillin-resistant staph aureus. I have little doubt that this represents acute/subacute osteomyelitis. The patient is currently on Doxy which I will continue he also completed 10 days of linezolid. We are now in a difficult situation with this patient's foot after considerable discussion we will send him back to see Dr. Doran Durand for a surgical opinion of this I'm also going to try to arrange a infectious disease consult at [facility] hopefully week and get this prior to her usual 4-6 weeks we having [place]. The patient clearly is going to need 6 weeks of IV vancomycin. If we cannot arrange this expediently I'll have to consider ordering this myself through a home infusion company 11/03/16; the patient now has a small in terms of circumference but probing wound. No bone palpable today. The patient remains on doxycycline 100 twice a day which should support him until he sees infectious disease at [facility] next week the following week on Wednesday I  believe he has an appointment with Dr. Andree Elk at [facility] who is his vascular cardiologist. Finally he has an appointment with Dr. Doran Durand on 11/22/16 we have been using silver alginate. The patient's wife states they are having trouble getting this through [facility] 11/13/16; the patient was seen by infectious disease at [facility] in the 11th PICC line placed in preparation for IV antibiotics. A tummy he has not going to get IV vancomycin o Ceftaroline. They've also ordered an MRI. Patient has a follow-up with Dr. Doran Durand on 11/22/16 and Dr. Andree Elk at [facility] tomorrow 11/23/16 the patient is on daptomycin as directed by infectious disease at [facility]. He is also been back to see Dr. Andree Elk at [facility]. He underwent a repeat arteriogram. He had a successful PTA of the right anterior tibial artery. He is on dual antiplatelete treatment with Plavix and aspirin. Finally he had the MRI of his foot in [place]. This showed cellulitis about the foot worse distally edema and enhancement in the reminiscent of the second metatarsal was consistent with osteomyelitis therefore what I was assuming to be the first metatarsal may be actually the second. He also has a fluid collection deep to the calcaneus at the level of the  calcaneal spur which could be an abscess or due to adventitial bursitis. He had a small tear in his Achilles 11/30/16; the patient continues on daptomycin as directed by infectious disease at [facility]. He is been revascularized by Dr. Andree Elk at [place] in [place]. He has been to see Gretta Arab who was the orthopedic surgeon who did his original amputation. I have not seen his not however per the patient's wife he did not offer another surgical local surgical prodecure to remove involved bone. He verbalized his usual disbelief in not just hyperbarics but any medical therapy for this condition(osteomyelitis). I discussed this in detail with the patient today including answering the question  about a BKA definitively "curing" the current condition. 12/07/16; the patient continues on daptomycin as directed by infectious disease at [facility]. This is directed at the MRSA that we cultured from his bone debridement from 12/22. Lab work today shows a white count of 8.7 hemoglobin of 10.5 which is microcytic and hypochromic differential count shows a slightly elevated monocyte count at 1.2 eosinophilic count of 0.6. His creatinine is 1.11 sedimentation rate apparently is gone from 35-34 now 40. I explained was wife I don't think this represents a trend. His total CK is 48 12/14/16- patient is here for follow-up evaluation of his right TMA site. He continues to receive IV daptomycin per infectious disease. His serum inflammatory markers remain elevated. He complains of intermittent pain to the medial aspect of the TMA site with intermittent erythema. He voices no complaints or concerns regarding hyperbaric therapy. Overall he and his wife are expressing a frustration and discouragement regarrding the length of time of treatment. 12/21/16; small open wound at roughly the first or second metatarsal metatarsalphalyngeal joint reminiscence of his transmetatarsal amputation site he continues to receive IV daptomycin per infectious disease at [facility]. He will finish these a week tomorrow. He has lab work which I been copied on. His white count is 10.8 hemoglobin 8.9 MCV is low at 75., MCH low at 24.5 platelet count slightly elevated at 626. Differential count shows 70% neutrophils 10% monocytes and 10% eosinophils. His comprehensive metabolic panel shows a slightly low sodium at 133 albumin low at 3.1 total CK is normal at 42 sedimentation rate is much higher at 82. He has had iron studies that show a serum iron of 15 and iron binding capacity of 238 and iron saturation of 6. This is suggestive of iron deficiency. B12 and folate were normal ferritin at 113 The patient tells me that he is not eating well  and he has lost weight. He feels episodically nauseated. He is coughing and gagging on mucus which she thinks is sinusitis. He has an appointment with his primary doctor at 5:00 this afternoon in [facility] 01/02/17; the patient developed a subacute pneumonitis. He was admitted to [facility] after a CT scan showed an extensive interstitial pneumonitis [I have not yet seen this]. 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 He has since been discharged. He is not on oxygen. He feels fatigued and very short of breath with exertion. At [facility] the wound care nurse there felt that his wound was healed. He did complete his daptomycin and has follow-up with infectious disease on Friday. X-rays I did before he went to [facility] still suggested residual osteomyelitis in the anterior aspect of the second must metatarsal head. I'm not sure I would've expected any different. There was no  other findings. I actually think I did this because of erythema over the first metatarsal head reminiscent 01/11/17; the patient has eosinophilic pneumonitis. He has been reviewed by pulmonology and given clearance for hyperbarics at least that's what his wife says. I'll need to see if there is note in care everywhere. Apparently the prognosis for improvement of daptomycin induced eosinophilic granulocyte is is 3 months without steroids. In the meantime infectious disease has placed him on doxycycline until the wound is closed. He is still is lost a lot of weight and his blood sugars are running in the mid 60s to low 80s fasting and at all times during the day 01/18/17; he has had adjustments in his insulin apparently his blood sugars in the morning or over 100. He wants to restart his hyperbaric treatment we'll do this at 1:00. 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 I think there is good reason to complete his treatments in order to give him the best chance of maintaining a healed status and these DFU 3 wounds with MRSA infection in the bone 02/15/17; the patient was seen today in conjunction with HBO. He completed hyperbaric oxygen today. The open area on his transmetatarsal site has remained closed. There was an area of erythema when I saw him earlier in the week on the posterior heel although that is resolved as of today as well. He has been using a cam walker. This is a patient who came to [place] after a transmetatarsal amputation that was necrotic and dehisced. He required revascularization percutaneously on 2 different occasions by Dr. Andree Elk of invasive cardiology at [facility]. 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 He went to [facility] infectious disease and had IV daptomycin for 5 weeks before developing eosinophilic pneumonitis and requiring an admission to hospital/ICU. He made a good recovery and is continued on doxycycline since. As mentioned his foot is closed now. He has a follow-up with Dr. Andree Elk tomorrow. He is going to [employer] on Monday for a custom-made shoe READMISSION Last visit Dr. Andree Elk [facility] 02/16/17 1. Critical limb ischemia of the RLE:  s/p transmetatarsal amputation of the RLE, now completely healed.  s/p right ATA percutaneous revascularization procedure x2, most recently 11/20/2016 s/p PTA of right AT 100% to less than 20% with a 2.5 x 200 balloon.  He does have some residual osteomyelitis, but given the wound is closed, the orthopedist has recommended to follow. He has a fitting for a special shoe coming up next week. He has completed a course of daptomycin and doxycycline and he has been discharged by ID. He has completed a course of hyperbaric therapy.  Continue Continue medical  management with aspirin, Plavix and statin therapy. We will discuss ongoing Plavix therapy at follow-up in 6 months.  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 Given no symptoms, we will conservatively manage. He doesn't want to do any more invasive studies at this time, which is reasonable. The patient arrives today out of 2 concerns both on the right transmetatarsal site. 1 at the level of the reminiscent fifth metatarsal head and the other at roughly the first or second. Both of these look like dark subcutaneous discoloration probably subdermal bleeding. He is recently obtained new adaptive  footwear for the right foot. He also has a callus on the left fifth dorsal toe however he follows with podiatry for this and I don't think this is any issue. ABIs in this clinic today were 0.66 on the right and 1.06 on the left. On entrance into our clinic initially this was 0.95 and 0.92. He does not describe current claudication. They're going away on a cruise in 8 weeks and I think are trying to do the month is much as they can proactively. They follow with podiatry and have an appointment with Dr. Andree Elk in October READMISSION 06/25/18 This is a patient that we have not seen in almost a year. He is a type II diabetic with known PAD. He is followed by Dr. Andree Elk of interventional cardiology at [facility] in [place]. He is required revascularization for significant PAD. When we first saw him he required a transmetatarsal amputation. He had underlying osteomyelitis with a nonhealing surgical wound. This eventually closed with wound care, IV antibiotics and hyperbaric oxygen. They tell me that he has a modified shoe and he is very active walking up to 4 miles a day. He is followed by Dr. Geroge Baseman of podiatry. His wife states that he underwent a removal of callus over this site  on July 9. She felt there may be drainage from this site after that although she could never really determined and there was callus buildup again. On 06/01/18 there was pressure bleeding through the overlying callus. he was given a prescription for 7 days of Bactrim. Fortuitously he has an appointment with Dr. Andree Elk on 06/04/18 and he immediately underwent revascularization of the right leg although I have not had a chance to review these records in care everywhere. This was apparently done through anterior and retrograde access. On 06/06/18 he had another debridement by Dr. Bernette Mayers. Vitamin soaking with Epsom salts for 20 minutes and applying calcium alginate. The original trans-met was on April 2017 I believe by Dr. Doran Durand. His ABI in our clinic was noncompressible today. 07/02/18; x-ray I ordered last week was negative for osteomyelitis. Swab culture was also negative. He is going to require an MRI which I have ordered today. 07/09/18; surprisingly the MRI of the foot that I ordered did not show osteomyelitis. He did suggest the possibility of cellulitis. For this reason I'll go ahead and give him a 10 day course of doxycycline. Although the previous culture of this area was negative 07/16/18; it arrives with the wound looking much the same. Roughly the same depth. He has thick subcutaneous tissue around the wound orifice but this still has roughly the same depth. Been using silver alginate. We applied Oasis #1 today 07/23/2018; still having to remove a lot of callus and thick subcutaneous tissue to actually define the wound here. Most of this seems to have closed down yet he has a comma shaped divot over the top of the area that still I think is open. We applied Oasis #2 His wife expressed concern about the tip of his left great toe. This almost looks like a small blister. She also showed it today to her podiatrist Dr. Geroge Baseman who did not think this was anything serious. I am not sure is  anything serious either however I think it bears some watching. He is not in any pain however he is insensate 07/30/2018;; still on a lot of nonviable tissue over the surface of the wound however cleaning this up reveals a more substantial wound orifice but less of a  probing wound depth. This is not probed to bone. There is no evidence of infection The wife is still concerned about a non-open area on the tip of his left great toe. Almost feels like a bony outgrowth. She had previously showed this to podiatry. I do not think this is a blister. A friction area would be possible although he is really not walking according to his wife. He had a small skin tag on his right buttock but no open wound here either 08/06/2018; we applied a third Oasis last week. Unfortunately there is really no improvement. Still requiring extensive debridement to expose the wound bed from a horizontal slitlike depression. I still have not been able to get this to fill in properly. On the positive side there is now no probable bone from when he first came into the facility. I changed him to silver alginate today after a reasonably aggressive debridement 08/13/2018; once again the patient comes in with skin and subcutaneous tissue closing over the small probing area with the underlying cavity of the wound on the right TMA site. I applied silver alginate to this last week. Prior to that we used Oasis x3 still not able to get this area granulating. He does not have a probing area of the bone which is an improvement from when I spur started working on this and an MRI did not suggest osteomyelitis. I do not see evidence of infection here but I am increasingly concerned about why I cannot get this area to granulate. Each time I debrided this is looks like this is simply a matter of getting granulation to fill in the hole and then getting epithelialization. This does not seem to happen Also I sent him back to see podiatry Dr.  Earleen Newport about the what felt to be bony outgrowth on the tip of his left great toe. Apparently after heel he left the clinic last week or the next day he developed a blood blister. He did see Dr. Earleen Newport. He went on to have a debridement of the medial nail cuticle he now has an open area here as well as some denuded skin. They did an x-ray apparently does have a bony outgrowth or spur but I am not able to look at this. They are using topical antibiotics apparently there was some suggested he use Santyl. Patient's wife was anxious for my opinion of this 08/20/2018; we are able to keep the wound open this time instead of the thick subcutaneous tissue closing over the top of it however unfortunately once again this probes to bone. I do not see any evidence of infection and previous MRI did not show osteomyelitis. I elected to go back to the Oasis to see if we can stimulate some granulation. Last saw his vascular interventional cardiologist Dr. Andree Elk at the beginning of August and he had a repeat procedure. Nevertheless I wonder how much blood flow he has down to this area With regards to the left first toe he is seeing Dr. Earleen Newport next week. They are applying Bactroban to this area. 08/27/2018 ooOnce again he comes in with thick eschar and subcutaneous tissue over the top of the small probing hole. This does not appear to go down to bone but it still has roughly the same depth. I reapplied Oasis today ooOver the left great toe there appears to be more of the wound at the tip of his toe than there was last week. This has an eschar on the surface of it. Will change  to [facility]. There is seeing podiatry this afternoon 09/03/2018 ooHe comes in today with the area on the transmetatarsal's site with a fair amount of callus, nonviable tissue over the circumference but it was not closed. I removed all of this as well as some subcutaneous debris and reapplied Oasis. There is no exposed bone ooThe area over the  tip of the left great toe started off as a nodule of uncertain etiology. He has been followed with podiatry. They have been removing part of the medial nail bed. He has nonviable tissue over the wound he been using Santyl in this area 09/10/18 ooUnfortunately comes in with neither wound area looking improved. The transmetatarsal amputation site once Again has nonviable debris over the surface requiring debridement. Unfortunately underneath this there is nothing that looks viable and this once again goes right down to bone. There is no purulent drainage and no erythema. ooAlso the surgical wound from podiatry on the left first toe has an ischemic-looking eschar over the surface of the tip of the toe I have elected not to attempt candidly debride this ooHis wife as arranged for him to have follow-up noninvasive studies in [place] under the care of Dr. Andree Elk clinic. The question is he has known severe PAD. They had recently seen him in August. He has had revascularizations in both legs within the last 4 or 5 months. He is not complaining of pain and I cannot really get a history of claudication. He has not systemically unwell 09/20/2018 the patient has been to [facility] and been revascularized by Dr. Andree Elk earlier this week. Apparently he was able to open up the anterior tibial artery although I have not actually seen his formal report. He is going for an attempt to revascularize on the left on Monday. He is apparently working with an investigational stent for lower extremity arteries below the knee and he has talked to the patient about placing that on Monday if possible. The patient has been using silver alginate on the transmetatarsal amputation site and Santyl on the left 09/30/2018; patient had his revascularization on the left this apparently included a standard approach as well as a more distal arterial catheterization although I do not have any information on this from Dr. Andree Elk. In fact  I do not even see the initial revascularization that he had on the right. I have included the arterial history from Dr. Andree Elk last note however below; ASSESSMENT/PLAN: 1. Hx of Critical limb ischemia bilateral lower extremities, PAD: -s/p transmetatarsal amputation of the RLE. -s/p right ATA percutaneous revascularization procedure x2, most recently 11/20/2016 s/p PTA of right AT 100% to less than 20% with a 2.5 x 200 balloon. 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 -03/21/2018 s/p PTA of 90% left popliteal artery to <10% with a 5x20 cutting balloon, PTA of 90% left peroneal to <20% with a 3x20 balloon, PTA of 100% left AT to <20% with a 2.5x220 balloon. 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 Will plan on the LLE soon after. Risks/benefits of procedure have been discussed and patient has elected to proceed. We have been using endoform to the right TMA amputation site wound and Santyl to the left great toe 10/07/2018; the area on the tip of  his left great toe looked better we have been using Santyl here. We continue to have a very difficult probing hole on the right TMA amputation site we have been using endoform. I went on to use his fifth Oasis today 10/14/2018; the tip of the left great toe continues to look better. We have been using Santyl here the surface however is healthy and I think we can change to an alginate. The right TMA has not changed. Once again he has no superficial opening there is callus and thick subcutaneous tissue over the orifice once you remove this there is the probing area that we have been dealing with without too much change. There is no palpable bone I have been placing Oasis here and  put Oasis #6 in this today after a more vigorous debridement 10/21/18; the left great toe still has necrotic surface requiring debridement. The right TMA site hasn't changed in view of the thick callus over the wound bed. With removal of this there is still the opening however this does not appear to have the same depth. Again there is no palpable bone. Oasis was replaced 10/28/2018; patient comes in with both wounds looking worse. The area over the first toe tip is now down to bone. The area over the TMA site is deeper and down to bone clearly with a increase in overall wound area. Equally concerning on the right TMA is the complete absence of a pulse this week which is a change. We are not even able to Doppler this. On the right he has a noncompressible ABI greater than 1.4 11/04/2018. Both wounds look somewhat worse. X-rays showed no osteomyelitis of the right foot but on the left there was underlying osteomyelitis in the left great toe distal phalanx. I been on the phone to Dr. Andree Elk surface at [facility] in [place] and they are arranging for another angiogram on the right on Monday. I have him on doxycycline for the osteomyelitis in the left great toe for now. Infectious disease may be necessary 1/17; 2-week hiatus. Patient was admitted to hospital at [place]. My understanding is he underwent an angioplasty of the right anterior tibial artery and had stents placed in the left anterior artery and the left tibial peroneal trunk. This was done by Dr. Andree Elk of interventional radiology. There is no major change in either 1 of the wounds. They have been using Aquacel Ag. As far as they are aware no imaging studies were done of the foot which is indeed unfortunate. I had him on doxycycline for 2 weeks since we identified the osteomyelitis in the left great toe by plain x-ray. I have renewed that again today. I am still suspicious about osteomyelitis in the amputation site and would consider  doing another MRI to compare with the one done in September. The idea of hyperbaric oxygen certainly comes up for discussion 1/24; no major change in either wound area. I have him on doxycycline for osteomyelitis at the tip of the left great toe. As noted he has been previously and recently revascularized by Dr. Andree Elk at [place]. He is tolerating the doxycycline well. For some reason we do not have an infectious disease consult yet. Culture of drainage from the right foot site last week was negative 1/31; MRI of the right foot did not show osteomyelitis of the right ankle and foot. Notable for a skin ulceration overlying the second metatarsal stump with generalizing soft tissue edema of the ankle and foot consistent with cellulitis. Noted to  have a partial-thickness tear of the Achilles tendon 7.5 cm proximal to the insertion Nothing really new in terms of symptoms. Patient's area on the tip of the left great toe is just about closed although he still has the probing area on the metatarsal amputation site. Appointment with Dr. Linus Salmons of infectious disease next week 2/7; Dr. Novella Olive did not feel that any further antibiotics were necessary he would follow-up in 2 months. The left great toe appears to be closed still some surface callus that I gently looked under high did not see anything open or anything that was threatening to be open. He still has the open area on the mid part of his TMA site using endoform 2/14; left great toe is closed and he is completing his doxycycline as of last Sunday. He is not on any antibiotics. Unfortunately out of the right foot his wife noticed some subdermal hemorrhage this week. He had been walking 2 miles I had given him permission to do so this is not really a plantar wound. Using endoform to this wound but I changed to silver alginate this week 2/21; left great toe remains closed. Culture last week grew Streptococcus angiosis which I am not really familiar with  however it is penicillin sensitive and I am going to put him on Augmentin. Not much change in the wound on the right foot the deep area is still probing precariously close to bone and the wound on the margin of the TMA is larger. 2/28; left great toe remains closed. He is completing the Augmentin I gave him last week. 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 This is being shown to Dr. Andree Elk at [place] to see if there is anything else that can be done here. He has been using silver alginate strips on the right 3/6; left great toe remains closed. He sees Dr. Andree Elk on Monday. The area on the plantar aspect of the right foot has the same small orifice with thick callused tissue around this. However this time with removal of the callus tissue the wound is open all the way along the incision line to the end medially. We have been using silver alginate. 3/13; left toe remains closed although the area is callused. He sees Dr. Jacqualyn Posey of podiatry next week. The area on the plantar right foot looked a lot better this week. Culture I did of this was negative we use silver alginate. He is going next week for an attempt at revascularization by Dr. Andree Elk 3/23; left toe remains closed although the area is callused. He will see Dr. Jacqualyn Posey in follow-up. The area on the right plantar foot continues to look surprisingly better over the last 3 visits. We have been using silver alginate. The revascularization he was supposed to have by Dr. Andree Elk at [facility] in [place] has been canceled [facility] procedure] 4/6; the right foot remains closed albeit callused. Podiatry canceled the appointment with regards to the left great toe. He has not seen Dr. Andree Elk at [facility] but thinks that Dr. Andree Elk has "done all he can do". He would be a candidate for the hemostaemix trial Readmission 07/29/2019 Mr. [name] is a man we know well from at least 3 previous stays in this clinic. He is a  type II diabetic with severe PAD followed by Dr. Andree Elk at [facility] in [place]. During his last stay here he had a probing wound bone in his right TMA site and a episode of osteomyelitis  on the tip of the left great toe at a surgical site. So far everything in both of these areas has remained closed. About 2 weeks ago he went to see his podiatrist at [facility] Dr. Babs Bertin. He had a thick callus on the left fifth metatarsal head that was shaved. He has developed an open wound in this area. They have been offloading this in his diabetic shoes. The patient has not had any more revascularizations by Dr. Andree Elk since the last time he was here. ABI in our clinic at the posterior tibial on the left was 1.06 10/6; no real change in the area on the plantar met head. Small wound with 2 mm of depth. He has thick skin probably from pressure around the wound. We have been using silver alginate 10/13; small wound in the left fifth plantar met head. Arrives today with undermining laterally and purulent drainage. Our intake nurse cultured this. His wife stated they noticed a change in color over the last day or 2. He is not systemically unwell 10/19; small wound on the fifth plantar metatarsal head. Culture I did last week showed Staphylococcus lugdunensis. Although this could be a skin contaminant the possibility of a skin and soft tissue infection was there. I did give him empiric doxycycline which should have covered this. We are using silver alginate to the wound. We put him in a total contact cast today. 10/22; small wound on the fifth plantar metatarsal head. He has completed antibiotics. He also has severe PAD which worries me about just about any wound on this man's foot 10/29; small superficial area on the fifth plantar metatarsal head. Measuring slightly smaller. He is not currently on any antibiotics. I been using a total contact cast in this man with very severe PAD but he seems to be  tolerating this well 11/5; left plantar fifth metatarsal head. Silver alginate being used under a total contact cast 11/12; wound not much different than last week. Using a #15 scalpel debridement around the wound. Still silver collagen under a total contact cast. He has severe PAD and that may be playing a role in this 11/19; disappointing that the wound is not really changed that much. I think it is come down in overall surface area because originally this was on the lateral part of the fifth metatarsal head however recently it is not really changed. Most of this is filled in but it will not epithelialized there is surface debris on this which may be mostly related to ischemia. They have an appointment with Dr. Andree Elk on 12/9 09/24/2019 on evaluation today patient appears to be doing somewhat better with regard to the wound on the left fifth metatarsal head. Fortunately there does not appear to be any signs of active infection at this time. No fevers, chills, nausea, vomiting, or diarrhea. The wound does not appear to be completely closed and I do feel like the Hydrofera Blue is helping to some degree although I feel like the cast as well as keeping things from breaking down or getting any larger at least. As far as his wound overview it shows that the overall size of the wound is slightly smaller but really maintaining within the realm of about the same. He had no troubles with the cast which is good news. 12/1; left fifth metatarsal head. Perhaps somewhat more vibrant. We have been using Hydrofera Blue. He sees Dr. Andree Elk at [place] 1 week tomorrow. He be back next week and I hope to  have a better idea which way the wound is going however I will not cast him next week in case Dr. Andree Elk wants to reevaluate things in his foot. The left leg is the leg with the stent in place and I wonder whether these are going to have to be reevaluated. 12/8; left fifth metatarsal head. Quite a bit better this week.  Only a small open area remains. He sees Dr. Andree Elk at [facility] tomorrow. Dr. Andree Elk is previously done his vascular interventions. He has 2 stents in the left leg as I remember things. I therefore will not put him in a total contact cast today. He will be using a forefoot off loader. We will use silver collagen on the wound. We will see him again next week 12/15; left fifth metatarsal head. He saw Dr. Andree Elk and is scheduled for an angiogram on Thursday. Unfortunately although his wound was a lot better last week it is really deteriorated this week. Small punched-out hole with depth and overhanging tissue. We have been using Hydrofera Blue 12/22; patient had an 80% stenosis proximal to the stent I believe in the below-knee popliteal artery. This was opened by Dr. Andree Elk. According the patient the stent itself was satisfactory. I have not been able to review this note. 12/29; patient arrives today with the wound about the same size some undermining medially. We have been using Hydrofera Blue under a total contact cast and that is what we will do again today Objective Constitutional Sitting or standing Blood Pressure is within target range for patient.. Pulse regular and within target range for patient.Marland Kitchen Respirations regular, non-labored and within target range.. Temperature is normal and within the target range for the patient.Marland Kitchen Appears in no distress. Vitals Time Taken: 12:50 PM, Height: 69 in, Weight: 210 lbs, BMI: 31, Temperature: 98.3 F, Pulse: 77 bpm, Respiratory Rate: 18 breaths/min, Blood Pressure: 117/78 mmHg, Capillary Blood Glucose: 131 mg/dl. General Notes: glucose per pt report Cardiovascular Dorsalis pedis pulses now present.. General Notes: Wound exam; no debridement is required some slough on the surface of the wound removed with Anasept and gauze. There is no surrounding infection Integumentary (Hair, Skin) Wound #5 status is Open. Original cause of wound was Gradually Appeared. The  wound is located on the Left Metatarsal head fifth. The wound measures 0.5cm length x 0.6cm width x 0.3cm depth; 0.236cm^2 area and 0.071cm^3 volume. There is Fat Layer (Subcutaneous Tissue) Exposed exposed. There is no tunneling noted, however, there is undermining starting at 12:00 and ending at 6:00 with a maximum distance of 0.3cm. There is a medium amount of serosanguineous drainage noted. The wound margin is thickened. There is medium (34-66%) pink granulation within the wound bed. There is a medium (34-66%) amount of necrotic tissue within the wound bed including Adherent Slough. 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 There was a Total Contact Cast Procedure by Ricard Dillon., MD. Post procedure Diagnosis Wound #5: Same as Pre-Procedure Plan Follow-up Appointments: Return Appointment in 1 week. Dressing Change Frequency: Wound #5 Left Metatarsal head fifth: Change Dressing every other day. Wound Cleansing: Wound #5 Left Metatarsal head fifth: May shower and wash wound with soap and  water. Primary Wound Dressing: Wound #5 Left Metatarsal head fifth: Hydrofera Blue - READY Other: - pad right great toe for protection Off-Loading: Total Contact Cast to Left Lower Extremity 1. Continue with Hydrofera Blue under the total contact cast 2. I will review Dr. Andree Elk notes next week Electronic Signature(s) Signed: 10/28/2019 6:15:30 PM By: [name] MD Entered By: [name] on 10/28/2019 13:42:32 -------------------------------------------------------------------------------- Total Contact Cast  Details Patient Name: Date of Service: [name], [name] 10/28/2019 12:30 PM Medical Record [id number] Patient Account Number: [id number] Date of Birth/Sex: 05-12-1946 (73 y.o. M) Treating RN: [name] Primary Care Provider: [name] Other Clinician: Referring Provider: Treating Provider/Extender:Londynn Sonoda, [name], [name] in Treatment: 13 Total Contact Cast Applied for Wound Assessment: Wound #5 Left Metatarsal head fifth Performed By: Physician Ricard Dillon., MD Post Procedure Diagnosis Same as Pre-procedure Electronic Signature(s) Signed: 10/28/2019 5:43:27 PM By: Todd Coria RN Signed: 10/28/2019 6:15:30 PM By: [name] MD Entered By: [name] on 10/28/2019 13:41:05 -------------------------------------------------------------------------------- SuperBill Details Patient Name: Date of Service: [name], [name] 10/28/2019 Medical Record [id number] Patient Account Number: [id number] Date of Birth/Sex: Treating RN: 07-Apr-1946 (73 y.o. [name]) Dolores Lory, Morey Hummingbird Primary Care Provider: [name] Other Clinician: Referring Provider: Treating Provider/Extender:Morio Widen, [name], [name] in Treatment: 13 Diagnosis Coding ICD-10 Codes Code Description E11.621 Type 2 diabetes mellitus with foot ulcer E11.51 Type 2 diabetes mellitus with diabetic peripheral angiopathy without gangrene L97.521 Non-pressure chronic ulcer of other part of left foot limited to breakdown of skin L03.116 Cellulitis of left lower limb Facility Procedures CPT4 Code Description: [id number] - APPLY TOTAL CONTACT LEG CAST ICD-10 Diagnosis Description L97.521 Non-pressure chronic ulcer of other part of left foot limite Modifier: d to breakdo Quantity: 1 wn of skin Physician Procedures CPT4 Code Description: [id number] - WC PHYS APPLY TOTAL CONTACT CAST ICD-10 Diagnosis Description L97.521 Non-pressure chronic ulcer of other part of left foot limited Modifier:  to breakdow Quantity: 1 n of skin Electronic Signature(s) Signed: 10/28/2019 6:15:30 PM By: [name] MD Entered By: [name] on 10/28/2019 13:42:43

## 2019-11-03 NOTE — Progress Notes (Signed)
[name], [name] ([id number]) Visit Report for 10/28/2019 Arrival Information Details Patient Name: Date of Service: [name], [name] 10/28/2019 12:30 PM Medical Record [id number] Patient Account Number: [id number] Date of Birth/Sex: Treating RN: Guz[birth year] (74 y.o. [name] Primary Care [name]: [name] Other Clinician: Referring [name] [name]: Treating [name]/Extender:[name], [name], [name] in Treatment: 13 Visit Information History Since Last Visit Added or deleted any medications: No Patient Arrived: Ambulatory Any new allergies or adverse reactions: No Arrival Time: 12:47 Had a fall or experienced change in No Accompanied By: wife activities of daily living that may affect Transfer Assistance: None risk of falls: Patient Identification Verified: Yes Signs or symptoms of abuse/neglect since No Secondary Verification Process Yes last visito Completed: Hospitalized since last visit: No Patient Requires Transmission-Based No Implantable device outside of the clinic No Precautions: excluding Patient Has Alerts: No cellular tissue based products placed in the center since last visit: Has Dressing in Place as Prescribed: Yes Has Footwear/Offloading in Place as Yes Prescribed: Left: Total Contact Cast Pain Present Now: No Electronic Signature(s) Signed: 10/30/2019 3:10:57 PM By: Zandra Abts RN, BSN Entered By: Zandra Abts on 10/28/2019 12:47:55 -------------------------------------------------------------------------------- Encounter Discharge Information Details Patient Name: Date of Service: [name], [name] 10/28/2019 12:30 PM Medical Record [id number] Patient Account Number: [id number] Date of Birth/Sex: Treating RN: Guzman[birth year] (74 y.o. [name] Primary Care Akeiba Axelson: [name] Other Clinician: Referring Aidee Latimore: Treating [name]/Extender:[name], [name], [name] in Treatment: 13 Encounter  Discharge Information Items Discharge Condition: Stable Ambulatory Status: Ambulatory Discharge Destination: Home Transportation: Private Auto Accompanied By: wife Schedule Follow-up Appointment: Yes Clinical Summary of Care: Patient Declined Electronic Signature(s) Signed: 11/03/2019 4:32:10 PM By: Cherylin Mylar Entered By: Cherylin Mylar on 10/28/2019 14:01:46 -------------------------------------------------------------------------------- Lower Extremity Assessment Details Patient Name: Date of Service: [name] 10/28/2019 12:30 PM Medical Record [id number] Patient Account Number: [id number] Date of Birth/Sex: Treating RN: [name] (73 y.o. [name] Primary Care Dyonna Jaspers: [name] Other Clinician: Referring Danaye Sobh: Treating [name]/Extender:[name], [name], [name] in Treatment: 13 Edema Assessment Assessed: [Left: No] [Guzman: No] Edema: [Left: Ye] [Guzman: s] Calf Left: Guzman: Point of Measurement: cm From Medial Instep 39.5 cm cm Ankle Left: Guzman: Point of Measurement: cm From Medial Instep 29.5 cm cm Vascular Assessment Pulses: Dorsalis Pedis Palpable: [Left:Yes] Electronic Signature(s) Signed: 10/30/2019 3:10:57 PM By: Zandra Abts RN, BSN Entered By: Zandra Abts on 10/28/2019 12:59:46 -------------------------------------------------------------------------------- Multi Wound Chart Details Patient Name: Date of Service: [name], [name] 10/28/2019 12:30 PM Medical Record [id number] Patient Account Number: [id number] Date of Birth/Sex: Treating RN: Guzman[birth year] (75 y.o. M) Primary Care [name]: [name] Other Clinician: Referring [name]: Treating [name]/Extender:[name], [name], [name] in Treatment: 13 Vital Signs Height(in): 69 Capillary Blood 131 Glucose(mg/dl): Weight(lbs): 119 Pulse(bpm): 77 Body Mass Index(BMI): 31 Blood Pressure(mmHg): 117/78 Temperature(F):  98.3 Respiratory 18 Rate(breaths/min): Photos: [5:No Photos] [N/A:N/A] Wound Location: [5:Left Metatarsal head fifth N/A] Wounding Event: [5:Gradually Appeared] [N/A:N/A] Primary Etiology: [5:Diabetic Wound/Ulcer of the N/A Lower Extremity] Comorbid History: [5:Cataracts, Chronic sinus N/A problems/congestion, Coronary Artery Disease, Hypertension, Peripheral Arterial Disease, Type II Diabetes, Osteoarthritis, Neuropathy] Date Acquired: [5:07/14/2019] [N/A:N/A] Weeks of Treatment: [5:13] [N/A:N/A] Wound Status: [5:Open] [N/A:N/A] Measurements L x W x D 0.5x0.6x0.3 [N/A:N/A] (cm) Area (cm) : [5:0.236] [N/A:N/A] Volume (cm) : [5:0.071] [N/A:N/A] % Reduction in Area: [5:-274.60%] [N/A:N/A] % Reduction in Volume: -446.20% [N/A:N/A] Starting Position 1 12 (o'clock): Ending Position 1 [5:6] (o'clock): Maximum Distance 1 [5:0.3] (cm): Undermining: [5:Yes] [N/A:N/A] Classification: [5:Grade 1] [N/A:N/A] Exudate Amount: [5:Medium] [N/A:N/A] Exudate Type: [5:Serosanguineous] [N/A:N/A] Exudate  Color: [5:red, brown] [N/A:N/A] Wound Margin: [5:Thickened] [N/A:N/A] Granulation Amount: [5:Medium (34-66%)] [N/A:N/A] Granulation Quality: [5:Pink] [N/A:N/A] Necrotic Amount: [5:Medium (34-66%)] [N/A:N/A] Exposed Structures: [5:Fat Layer (Subcutaneous Tissue) Exposed: Yes Fascia: No Tendon: No Muscle: No Joint: No Bone: No Medium (34-66%)] [N/A:N/A N/A] Treatment Notes Electronic Signature(s) Signed: 10/28/2019 6:15:30 PM By: Todd Najjar MD Entered By: [name] on 10/28/2019 13:40:02 -------------------------------------------------------------------------------- Multi-Disciplinary Care Plan Details Patient Name: Date of Service: [name], [name] 10/28/2019 12:30 PM Medical Record [id number] Patient Account Number: [id number] Date of Birth/Sex: Treating RN: [birth year]man (73 y.o. [name] Primary Care Crislyn Willbanks: [name] Other Clinician: Referring Kharis Lapenna:  Treating Remee Charley/Extender:[name], [name], [name] in Treatment: 13 Active Inactive Wound/Skin Impairment Nursing Diagnoses: Knowledge deficit related to ulceration/compromised skin integrity Goals: Patient/caregiver will verbalize understanding of skin care regimen Date Initiated: 07/29/2019 Target Resolution Date: 11/07/2019 Goal Status: Active Ulcer/skin breakdown will have a volume reduction of 30% by week 4 Date Inactivated: 10/21/2019 Target12/08/2019 Resolution Date Initiated: 07/29/2019 Date: Unmet Reason: comorbities. Goal Status: Unmet blood flow Ulcer/skin breakdown will have a volume reduction of 50% by week 8 Date Initiated: 10/21/2019 Target Resolution Date: 11/21/2019 Goal Status: Active Interventions: Assess patient/caregiver ability to obtain necessary supplies Assess patient/caregiver ability to perform ulcer/skin care regimen upon admission and as needed Assess ulceration(s) every visit Notes: Electronic Signature(s) Signed: 10/28/2019 5:43:27 PM By: [name] RN Entered By: [name] on 10/28/2019 13:25:06 -------------------------------------------------------------------------------- Pain Assessment Details Patient Name: Date of Service: [name], [name] 10/28/2019 12:30 PM Medical Record [id number] Patient Account Number: [id number] Date of Birth/Sex: Treating RN: [birth year]/Guzman (74 y.o. [name] Primary Care Rodolfo Gaster: [name] Other Clinician: Referring Hesham Womac: Treating Alvine Mostafa/Extender:[name], [name], [name] in Treatment: 13 Active Problems Location of Pain Severity and Description of Pain Patient Has Paino No Site Locations Pain Management and Medication Current Pain Management: Electronic Signature(s) Signed: 10/30/2019 3:10:57 PM By: Zandra Abts RN, BSN Entered By: Zandra Abts on 10/28/2019  12:50:50 -------------------------------------------------------------------------------- Patient/Caregiver Education Details Patient Name: Jeraldine Loots 12/29/2020andnbsp12:30 Date of Service: PM Medical Record [id number] Number: Patient Account Number: [id number] Treating RN: 11/11/Guzman (73 y.o. [name] Date of Birth/Gender: M) Other Clinician: Primary Care Physician:Howard, Rob Hickman Referring Physician: Physician/Extender: Joaquin Bend in Treatment: 13 Education Assessment Education Provided To: Patient Education Topics Provided Wound/Skin Impairment: Methods: Explain/Verbal Responses: State content correctly Electronic Signature(s) Signed: 10/28/2019 5:43:27 PM By: [name] RN Entered By: [name] on 10/28/2019 13:25:21 -------------------------------------------------------------------------------- Wound Assessment Details Patient Name: Date of Service: [name], [name] 10/28/2019 12:30 PM Medical Record [id number] Patient Account Number: [id number] Date of Birth/Sex: Treating RN: [birth year], Guzman (74 y.o. [name] Primary Care Bertram Haddix: [name] Other Clinician: Referring Cruz Bong: Treating Lonney Revak/Extender:[name], [name], [name] in Treatment: 13 Wound Status Wound Number: 5 Primary Diabetic Wound/Ulcer of the Lower Extremity Etiology: Wound Location: Left Metatarsal head fifth Wound Open Wounding Event: Gradually Appeared Status: Date Acquired: 07/14/2019 Comorbid Cataracts, Chronic sinus problems/congestion, Weeks Of Treatment: 13 History: Coronary Artery Disease, Hypertension, Clustered Wound: No Peripheral Arterial Disease, Type II Diabetes, Osteoarthritis, Neuropathy Photos Wound Measurements Length: (cm) 0.5 Width: (cm) 0.6 Depth: (cm) 0.3 Area: (cm) 0.236 Volume: (cm) 0.071 % Reduction in Area: -274.6% % Reduction in Volume: -446.2% Epithelialization: Medium  (34-66%) Tunneling: No Undermining: Yes Starting Position (o'clock): 12 Ending Position (o'clock): 6 Maximum Distance: (cm) 0.3 Wound Description Classification: Grade 1 Foul Odor A Wound Margin: Thickened Slough/Fibr Exudate Amount: Medium Exudate Type: Serosanguineous Exudate Color: red, brown Wound Bed Granulation Amount: Medium (34-66%) Granulation Quality: Pink  Fascia Expos Necrotic Amount: Medium (34-66%) Fat Layer (S Necrotic Quality: Adherent Slough Tendon Expos Muscle Expos Joint Expose Bone Exposed fter Cleansing: No ino Yes Exposed Structure ed: No ubcutaneous Tissue) Exposed: Yes ed: No ed: No d: No : No Treatment Notes Wound #5 (Left Metatarsal head fifth) 1. Cleanse With Wound Cleanser 2. Periwound Care Skin Prep 3. Primary Dressing Applied Foam Hydrofera Blue 5. Secured With Tape Notes TCC applied by MD. Electronic Signature(s) Signed: 10/29/2019 3:39:15 PM By: Mikeal Hawthorne EMT/HBOT Signed: 10/30/2019 3:10:57 PM By: Levan Hurst RN, BSN Entered By: Mikeal Hawthorne on 10/29/2019 15:12:18 -------------------------------------------------------------------------------- [employer] Details Patient Name: Date of Service: [name], [name] 10/28/2019 12:30 PM Medical Record [id number] Patient Account Number: [id number] Date of Birth/Sex: Treating RN: [birth year] (74 y.o. [name] Primary Care Jaelee Laughter: [name] Other Clinician: Referring Chlora Mcbain: Treating Dinara Lupu/Extender:[name], [name], [name] in Treatment: 13 Vital Signs Time Taken: 12:50 Temperature (F): 98.3 Height (in): 69 Pulse (bpm): 77 Weight (lbs): 210 Respiratory Rate (breaths/min): 18 Body Mass Index (BMI): 31 Blood Pressure (mmHg): 117/78 Capillary Blood Glucose (mg/dl): 131 Reference Range: 80 - 120 mg / dl Notes glucose per pt report Electronic Signature(s) Signed: 10/30/2019 3:10:57 PM By: Levan Hurst RN, BSN Entered By: Levan Hurst  on 10/28/2019 12:50:44

## 2019-11-04 ENCOUNTER — Encounter (HOSPITAL_BASED_OUTPATIENT_CLINIC_OR_DEPARTMENT_OTHER): Payer: Medicare Other | Admitting: Internal Medicine

## 2019-11-04 ENCOUNTER — Other Ambulatory Visit: Payer: Self-pay

## 2019-11-04 DIAGNOSIS — E1151 Type 2 diabetes mellitus with diabetic peripheral angiopathy without gangrene: Secondary | ICD-10-CM | POA: Insufficient documentation

## 2019-11-04 DIAGNOSIS — E11621 Type 2 diabetes mellitus with foot ulcer: Secondary | ICD-10-CM | POA: Insufficient documentation

## 2019-11-04 DIAGNOSIS — L03116 Cellulitis of left lower limb: Secondary | ICD-10-CM | POA: Diagnosis not present

## 2019-11-04 DIAGNOSIS — L97521 Non-pressure chronic ulcer of other part of left foot limited to breakdown of skin: Secondary | ICD-10-CM | POA: Diagnosis not present

## 2019-11-04 DIAGNOSIS — T8189XA Other complications of procedures, not elsewhere classified, initial encounter: Secondary | ICD-10-CM | POA: Insufficient documentation

## 2019-11-04 DIAGNOSIS — I2510 Atherosclerotic heart disease of native coronary artery without angina pectoris: Secondary | ICD-10-CM | POA: Diagnosis not present

## 2019-11-04 DIAGNOSIS — Z794 Long term (current) use of insulin: Secondary | ICD-10-CM | POA: Diagnosis not present

## 2019-11-04 DIAGNOSIS — I10 Essential (primary) hypertension: Secondary | ICD-10-CM | POA: Insufficient documentation

## 2019-11-04 NOTE — Progress Notes (Signed)
[name], [name] ([id number]) Visit Report for 11/04/2019 HPI Details Patient Name: Date of Service: JERET, Todd 11/04/2019 1:45 PM Medical Record [id number] Patient Account Number: [id number] Date of Birth/Sex: Treating RN: 1946/09/01 (74 y.o. Jerilynn Guzman) [name] Primary Care Provider: [name] Other Clinician: Referring Provider: Treating Provider/Extender:[name], [name], [name] in Treatment: 14 History of Present Illness HPI Description: 05/18/16; this is a 74year-old diabetic who is a type II diabetic on insulin. The history is that he traumatized his right foot developed a sore sometime in late March. Shortly thereafter he went on a cruise but he had to get off the cruise ship in [place] and fly urgently back to [place] where he was admitted to [facility] and ultimately underwent a transmetatarsal amputation by Dr. 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 According to the patient and his wife this wound never really healed. He was seen on 2 occasions in the wound care center in [place] and had vascular studies and then was referred urgently to Dr. Bridgett Larsson of vascular surgery. He underwent an angiogram on 05/10/16. Unfortunately nothing really could be done to improve his vascular status. He had a 75-90% stenosis in the midsegment of 1 segment of the posterior femoral artery. He had a patent popliteal, his anterior tibial occluded shortly after takeoff. Perineal had a greater than 90% stenosis posterior tibial is occluded feet had no distal collaterals feed distal aspect of the transmetatarsal amputation site. The patient tells me that he had a prolonged period of Santyl by Dr. Doran Durand was some initial improvement but then this was stopped. I think they're only applying daily dressings/dry dressings. He has not had a recent x-ray of the right foot he did have one before his surgery in April. His wife by the dimensions of the wound/surgical site being followed at  home since 4/20. At that point the dimensions were 0.5 x 12 x 0.2 on 7/19 this was 1.8 x 6 x 0.4. He is not currently on any antibiotics. His hemoglobin A1c in early April was 12.9 at that point he was started on insulin. Apparently his blood sugars are much lower he has an appointment with Dr. Legrand Como Alteimer of endocrine next week. 05/29/16 x-ray of the area did not show osteomyelitis. I think he probably needs an MRI at this point. His wife is asking about something called"Yireh" cream which is not FDA approved. I have not heard of this. 06/22/16; MRI did not really suggest osteomyelitis. There was minimal marrow edema and enhancement in the stump of the second metatarsal felt to be secondary likely to postoperative change rather than osteomyelitis. The patient has arranged his own consultation with Dr. Andree Elk at [place], apparently their daughter lives in [place] and has some connection here. Any improvement in vascular supply by Dr. Andree Elk would of course be helpful. Dr. Bridgett Larsson did not feel that anything further could be done other than amputation if wound care did not result in healing or if the area deteriorates. 06/26/16; the patient has been to see Dr. Andree Elk at [place] and had an angiogram. He is going for a procedure on Thursday which will involve catheterization. I'm not sure if this is an anterograde or retrograde approach. He has been using Santyl to the wound 07/10/16; the patient had a repeat angiogram and angioplasty at [facility] by Dr. Brunetta Jeans. His angiogram showed right CFA and profundal widely patent. The right as of a.m. popliteal artery were widely patent the right anterior tibial was occluded proximally and reconstitutes at  the ankle. Peroneal artery was patent to the foot. Posterior tibial artery was occluded. The patient had angioplasty of the anterior tibial artery.. This was quite successful. He was recommended for Plavix as well as aspirin. 07/17/16; the patient was close to be a nurse  visit today however the outer dressing of the Apligraf fell off. Noted drainage. I was asked to see the wound. The patient is noted an odor however his wife had noted that. Drainage with Apligraf not necessarily a bad thing. He has not been systemically unwell 07/24/16; we are still have an issue with drainage of this wound. In spite of this I applied his second Apligraf. Medially the area still is probing to bone. 08/07/16; Apligraf reapplied in general wound looks improved. 08/21/16 Apligraf #4. Wound looks much better 09/04/16 patientt's wound again today continues to appear to improve with the application of the Apligraf's. He notes no increased discomfort or concerns at this point in time. 09/18/16; the patient returns today 2 weeks after his fifth application of Apligraf. Predictably three quarters of the width of this wound has healed. The deep area that probe to bone medially is still open. The patient asked how much out-of-pocket dollars would be for additional Apligraf's. 09/25/16; now using Hydrofera Blue. He has completed 5 Apligraf applications with considerable improvement in this deep open transmetatarsal amputation site. His wound is now a triangular-shaped wound on the medial aspect. At roughly 12 to 2:00 this probes another centimeter but as opposed to in the past this does not probe to bone. The patient has been seen at [place] by Dr. Zenia Resides. He is not planning to do any more revascularization unless the wound stalls or worsens per the patient 10/02/16; 0.7 x 0.8 x 0.8. Unfortunately although the wound looks stable to improved. There is now easily probable bone. This hasn't been present for several weeks. Patient is not otherwise symptomatic he is not experiencing any pain. I did a culture of the wound bed 10/09/16. Deterioration last week. Culture grew MRSA and although there is improvement here with doxycycline prescribed over the phone I'm going to try to get him linezolid 600 twice  a day for 10 days today. 10/16/16; he is completing a weeks worth of linezolid and still has 3 more days to go. Small triangular-shaped open area with some degree of undermining. There is still palpable bone with a curet. Overall the area appears better than last week 10/20/16 he has completed the linezolid still having some nausea and vomiting but no diarrhea. He has exposed bone this week which is a deterioration. 10/27/16 patient now has a small but probing wound down to bone. Culture of this bone that I did last week showed a few methicillin-resistant staph aureus. I have little doubt that this represents acute/subacute osteomyelitis. The patient is currently on Doxy which I will continue he also completed 10 days of linezolid. We are now in a difficult situation with this patient's foot after considerable discussion we will send him back to see Dr. Doran Durand for a surgical opinion of this I'm also going to try to arrange a infectious disease consult at [facility] hopefully week and get this prior to her usual 4-6 weeks we having [place]. The patient clearly is going to need 6 weeks of IV vancomycin. If we cannot arrange this expediently I'll have to consider ordering this myself through a home infusion company 11/03/16; the patient now has a small in terms of circumference but probing wound. No bone palpable today. The  patient remains on doxycycline 100 twice a day which should support him until he sees infectious disease at [facility] next week the following week on Wednesday I believe he has an appointment with Dr. Andree Elk at [facility] who is his vascular cardiologist. Finally he has an appointment with Dr. Doran Durand on 11/22/16 we have been using silver alginate. The patient's wife states they are having trouble getting this through [facility] 11/13/16; the patient was seen by infectious disease at [facility] in the 11th PICC line placed in preparation for IV antibiotics. A tummy he has not going to get IV  vancomycin o Ceftaroline. They've also ordered an MRI. Patient has a follow-up with Dr. Doran Durand on 11/22/16 and Dr. Andree Elk at [facility] tomorrow 11/23/16 the patient is on daptomycin as directed by infectious disease at [facility]. He is also been back to see Dr. Andree Elk at [facility]. He underwent a repeat arteriogram. He had a successful PTA of the right anterior tibial artery. He is on dual antiplatelete treatment with Plavix and aspirin. Finally he had the MRI of his foot in [place]. This showed cellulitis about the foot worse distally edema and enhancement in the reminiscent of the second metatarsal was consistent with osteomyelitis therefore what I was assuming to be the first metatarsal may be actually the second. He also has a fluid collection deep to the calcaneus at the level of the calcaneal spur which could be an abscess or due to adventitial bursitis. He had a small tear in his Achilles 11/30/16; the patient continues on daptomycin as directed by infectious disease at [facility]. He is been revascularized by Dr. Andree Elk at [facility] in [place]. He has been to see Gretta Arab who was the orthopedic surgeon who did his original amputation. I have not seen his not however per the patient's wife he did not offer another surgical local surgical prodecure to remove involved bone. He verbalized his usual disbelief in not just hyperbarics but any medical therapy for this condition(osteomyelitis). I discussed this in detail with the patient today including answering the question about a BKA definitively "curing" the current condition. 12/07/16; the patient continues on daptomycin as directed by infectious disease at [facility]. This is directed at the MRSA that we cultured from his bone debridement from 12/22. Lab work today shows a white count of 8.7 hemoglobin of 10.5 which is microcytic and hypochromic differential count shows a slightly elevated monocyte count at 1.2 eosinophilic count of 0.6. His  creatinine is 1.11 sedimentation rate apparently is gone from 35-34 now 40. I explained was wife I don't think this represents a trend. His total CK is 48 12/14/16- patient is here for follow-up evaluation of his right TMA site. He continues to receive IV daptomycin per infectious disease. His serum inflammatory markers remain elevated. He complains of intermittent pain to the medial aspect of the TMA site with intermittent erythema. He voices no complaints or concerns regarding hyperbaric therapy. Overall he and his wife are expressing a frustration and discouragement regarrding the length of time of treatment. 12/21/16; small open wound at roughly the first or second metatarsal metatarsalphalyngeal joint reminiscence of his transmetatarsal amputation site he continues to receive IV daptomycin per infectious disease at [facility]. He will finish these a week tomorrow. He has lab work which I been copied on. His white count is 10.8 hemoglobin 8.9 MCV is low at 75., MCH low at 24.5 platelet count slightly elevated at 626. Differential count shows 70% neutrophils 10% monocytes and 10% eosinophils. His  comprehensive metabolic panel shows a slightly low sodium at 133 albumin low at 3.1 total CK is normal at 42 sedimentation rate is much higher at 82. He has had iron studies that show a serum iron of 15 and iron binding capacity of 238 and iron saturation of 6. This is suggestive of iron deficiency. B12 and folate were normal ferritin at 113 The patient tells me that he is not eating well and he has lost weight. He feels episodically nauseated. He is coughing and gagging on mucus which she thinks is sinusitis. He has an appointment with his primary doctor at 5:00 this afternoon in [facility] 01/02/17; the patient developed a subacute pneumonitis. He was admitted to [facility] after a CT scan showed an extensive interstitial pneumonitis [I have not yet seen this]. 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 He has since been discharged. He is not on oxygen. He feels fatigued and very short of breath with exertion. At [facility] the wound care nurse there felt that his wound was healed. He did complete his daptomycin and has follow-up with infectious disease on Friday. X-rays I did before he went to [facility] still suggested residual osteomyelitis in the anterior aspect of the second must metatarsal head. I'm not sure I would've expected any different. There was no other findings. I actually think I did this because of erythema over the first metatarsal head reminiscent 01/11/17; the patient has eosinophilic pneumonitis. He has been reviewed by pulmonology and given clearance for hyperbarics at least that's what his wife says. I'll need to see if there is note in care everywhere. Apparently the prognosis for improvement of daptomycin induced eosinophilic granulocyte is is 3 months without steroids. In the meantime infectious disease has placed him on doxycycline until the wound is closed. He is still is lost a lot of weight and his blood sugars are running in the mid 60s to low 80s fasting and at all times during the day 01/18/17; he has had adjustments in his insulin apparently his blood sugars in the morning or over 100. He wants to restart his hyperbaric treatment we'll do this at 1:00. 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 I think there is good reason to complete his treatments in order to give him the best chance of maintaining a healed status and these DFU 3 wounds with MRSA infection in the bone 02/15/17; the patient was seen today in conjunction with HBO. He completed hyperbaric oxygen today. The open area on his transmetatarsal site has remained closed. There was an area of erythema when I saw him  earlier in the week on the posterior heel although that is resolved as of today as well. He has been using a cam walker. This is a patient who came to [place] after a transmetatarsal amputation that was necrotic and dehisced. He required revascularization percutaneously on 2 different occasions by Dr. Andree Elk of invasive cardiology at [facility]. 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 He went to [facility] infectious disease and had IV daptomycin for 5 weeks before developing eosinophilic pneumonitis and requiring an admission to hospital/ICU. He made a good recovery and is continued on doxycycline since. As mentioned his foot is closed now. He has a follow-up with Dr. Andree Elk tomorrow. He is going  to [employer] on Monday for a custom-made shoe READMISSION Last visit Dr. Andree Elk [facility] 02/16/17 1. Critical limb ischemia of the RLE: s/p transmetatarsal amputation of the RLE, now completely healed. s/p right ATA percutaneous revascularization procedure x2, most recently 11/20/2016 s/p PTA of right AT 100% to less than 20% with a 2.5 x 200 balloon. He does have some residual osteomyelitis, but given the wound is closed, the orthopedist has recommended to follow. He has a fitting for a special shoe coming up next week. He has completed a course of daptomycin and doxycycline and he has been discharged by ID. He has completed a course of hyperbaric therapy. Continue Continue medical management with aspirin, Plavix and statin therapy. We will discuss ongoing Plavix therapy at follow-up in 6 months. 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 Given no symptoms, we will conservatively manage. He doesn't want to do any more invasive studies at this time, which is reasonable. The patient  arrives today out of 2 concerns both on the right transmetatarsal site. 1 at the level of the reminiscent fifth metatarsal head and the other at roughly the first or second. Both of these look like dark subcutaneous discoloration probably subdermal bleeding. He is recently obtained new adaptive footwear for the right foot. He also has a callus on the left fifth dorsal toe however he follows with podiatry for this and I don't think this is any issue. ABIs in this clinic today were 0.66 on the right and 1.06 on the left. On entrance into our clinic initially this was 0.95 and 0.92. He does not describe current claudication. They're going away on a cruise in 8 weeks and I think are trying to do the month is much as they can proactively. They follow with podiatry and have an appointment with Dr. Andree Elk in October READMISSION 06/25/18 This is a patient that we have not seen in almost a year. He is a type II diabetic with known PAD. He is followed by Dr. Andree Elk of interventional cardiology at [facility] in [place]. He is required revascularization for significant PAD. When we first saw him he required a transmetatarsal amputation. He had underlying osteomyelitis with a nonhealing surgical wound. This eventually closed with wound care, IV antibiotics and hyperbaric oxygen. They tell me that he has a modified shoe and he is very active walking up to 4 miles a day. He is followed by Dr. Geroge Baseman of podiatry. His wife states that he underwent a removal of callus over this site on July 9. She felt there may be drainage from this site after that although she could never really determined and there was callus buildup again. On 06/01/18 there was pressure bleeding through the overlying callus. he was given a prescription for 7 days of Bactrim. Fortuitously he has an appointment with Dr. Andree Elk on 06/04/18 and he immediately underwent revascularization of the right leg although I have not had a chance to review these  records in care everywhere. This was apparently done through anterior and retrograde access. On 06/06/18 he had another debridement by Dr. Bernette Mayers. Vitamin soaking with Epsom salts for 20 minutes and applying calcium alginate. The original trans-met was on April 2017 I believe by Dr. Doran Durand. His ABI in our clinic was noncompressible today. 07/02/18; x-ray I ordered last week was negative for osteomyelitis. Swab culture was also negative. He is going to  require an MRI which I have ordered today. 07/09/18; surprisingly the MRI of the foot that I ordered did not show osteomyelitis. He did suggest the possibility of cellulitis. For this reason I'll go ahead and give him a 10 day course of doxycycline. Although the previous culture of this area was negative 07/16/18; it arrives with the wound looking much the same. Roughly the same depth. He has thick subcutaneous tissue around the wound orifice but this still has roughly the same depth. Been using silver alginate. We applied Oasis #1 today 07/23/2018; still having to remove a lot of callus and thick subcutaneous tissue to actually define the wound here. Most of this seems to have closed down yet he has a comma shaped divot over the top of the area that still I think is open. We applied Oasis #2 His wife expressed concern about the tip of his left great toe. This almost looks like a small blister. She also showed it today to her podiatrist Dr. Geroge Baseman who did not think this was anything serious. I am not sure is anything serious either however I think it bears some watching. He is not in any pain however he is insensate 07/30/2018;; still on a lot of nonviable tissue over the surface of the wound however cleaning this up reveals a more substantial wound orifice but less of a probing wound depth. This is not probed to bone. There is no evidence of infection The wife is still concerned about a non-open area on the tip of his left great toe. Almost feels like a  bony outgrowth. She had previously showed this to podiatry. I do not think this is a blister. A friction area would be possible although he is really not walking according to his wife. He had a small skin tag on his right buttock but no open wound here either 08/06/2018; we applied a third Oasis last week. Unfortunately there is really no improvement. Still requiring extensive debridement to expose the wound bed from a horizontal slitlike depression. I still have not been able to get this to fill in properly. On the positive side there is now no probable bone from when he first came into the facility. I changed him to silver alginate today after a reasonably aggressive debridement 08/13/2018; once again the patient comes in with skin and subcutaneous tissue closing over the small probing area with the underlying cavity of the wound on the right TMA site. I applied silver alginate to this last week. Prior to that we used Oasis x3 still not able to get this area granulating. He does not have a probing area of the bone which is an improvement from when I spur started working on this and an MRI did not suggest osteomyelitis. I do not see evidence of infection here but I am increasingly concerned about why I cannot get this area to granulate. Each time I debrided this is looks like this is simply a matter of getting granulation to fill in the hole and then getting epithelialization. This does not seem to happen Also I sent him back to see podiatry Dr. Earleen Newport about the what felt to be bony outgrowth on the tip of his left great toe. Apparently after heel he left the clinic last week or the next day he developed a blood blister. He did see Dr. Earleen Newport. He went on to have a debridement of the medial nail cuticle he now has an open area here as well as some denuded skin. They  did an x-ray apparently does have a bony outgrowth or spur but I am not able to look at this. They are using topical antibiotics  apparently there was some suggested he use Santyl. Patient's wife was anxious for my opinion of this 08/20/2018; we are able to keep the wound open this time instead of the thick subcutaneous tissue closing over the top of it however unfortunately once again this probes to bone. I do not see any evidence of infection and previous MRI did not show osteomyelitis. I elected to go back to the Oasis to see if we can stimulate some granulation. Last saw his vascular interventional cardiologist Dr. Andree Elk at the beginning of August and he had a repeat procedure. Nevertheless I wonder how much blood flow he has down to this area With regards to the left first toe he is seeing Dr. Earleen Newport next week. They are applying Bactroban to this area. 08/27/2018 Once again he comes in with thick eschar and subcutaneous tissue over the top of the small probing hole. This does not appear to go down to bone but it still has roughly the same depth. I reapplied Oasis today Over the left great toe there appears to be more of the wound at the tip of his toe than there was last week. This has an eschar on the surface of it. Will change to Santyl. There is seeing podiatry this afternoon 09/03/2018 He comes in today with the area on the transmetatarsal's site with a fair amount of callus, nonviable tissue over the circumference but it was not closed. I removed all of this as well as some subcutaneous debris and reapplied Oasis. There is no exposed bone The area over the tip of the left great toe started off as a nodule of uncertain etiology. He has been followed with podiatry. They have been removing part of the medial nail bed. He has nonviable tissue over the wound he been using Santyl in this area 09/10/18 Unfortunately comes in with neither wound area looking improved. The transmetatarsal amputation site once Again has nonviable debris over the surface requiring debridement. Unfortunately underneath this there is  nothing that looks viable and this once again goes right down to bone. There is no purulent drainage and no erythema. Also the surgical wound from podiatry on the left first toe has an ischemic-looking eschar over the surface of the tip of the toe I have elected not to attempt candidly debride this His wife as arranged for him to have follow-up noninvasive studies in [place] under the care of Dr. Andree Elk clinic. The question is he has known severe PAD. They had recently seen him in August. He has had revascularizations in both legs within the last 4 or 5 months. He is not complaining of pain and I cannot really get a history of claudication. He has not systemically unwell 09/20/2018 the patient has been to [facility] and been revascularized by Dr. Andree Elk earlier this week. Apparently he was able to open up the anterior tibial artery although I have not actually seen his formal report. He is going for an attempt to revascularize on the left on Monday. He is apparently working with an investigational stent for lower extremity arteries below the knee and he has talked to the patient about placing that on Monday if possible. The patient has been using silver alginate on the transmetatarsal amputation site and Santyl on the left 09/30/2018; patient had his revascularization on the left this apparently included a standard approach  as well as a more distal arterial catheterization although I do not have any information on this from Dr. Andree Elk. In fact I do not even see the initial revascularization that he had on the right. I have included the arterial history from Dr. Andree Elk last note however below; ASSESSMENT/PLAN: 1. Hx of Critical limb ischemia bilateral lower extremities, PAD: -s/p transmetatarsal amputation of the RLE. -s/p right ATA percutaneous revascularization procedure x2, most recently 11/20/2016 s/p PTA of right AT 100% to less than 20% with a 2.5 x 200 balloon. -On original angiogram  05/15/2016, he had a significant 70-95% left popliteal artery stenosis with AT and PT artery occlusions and one vessel runoff via the peroneal artery. -03/21/2018 s/p PTA of 90% left popliteal artery to <10% with a 5x20 cutting balloon, PTA of 90% left peroneal to <20% with a 3x20 balloon, PTA of 100% left AT to <20% with a 2.5x220 balloon. 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 Will plan on the LLE soon after. Risks/benefits of procedure have been discussed and patient has elected to proceed. We have been using endoform to the right TMA amputation site wound and Santyl to the left great toe 10/07/2018; the area on the tip of his left great toe looked better we have been using Santyl here. We continue to have a very difficult probing hole on the right TMA amputation site we have been using endoform. I went on to use his fifth Oasis today 10/14/2018; the tip of the left great toe continues to look better. We have been using Santyl here the surface however is healthy and I think we can change to an alginate. The right TMA has not changed. Once again he has no superficial opening there is callus and thick subcutaneous tissue over the orifice once you remove this there is the probing area that we have been dealing with without too much change. There is no palpable bone I have been placing Oasis here and put Oasis #6 in this today after a more vigorous debridement 10/21/18; the left great toe still has necrotic surface requiring debridement. The right TMA site hasn't changed in view of the thick callus over the wound bed. With removal of this there is still the opening however this does not appear to have the same depth. Again there is no palpable bone. Oasis was replaced 10/28/2018; patient comes in with both wounds looking worse. The area over the  first toe tip is now down to bone. The area over the TMA site is deeper and down to bone clearly with a increase in overall wound area. Equally concerning on the right TMA is the complete absence of a pulse this week which is a change. We are not even able to Doppler this. On the right he has a noncompressible ABI greater than 1.4 11/04/2018. Both wounds look somewhat worse. X-rays showed no osteomyelitis of the right foot but on the left there was underlying osteomyelitis in the left great toe distal phalanx. I been on the phone to Dr. Andree Elk surface at [facility] in [place] and they are arranging for another angiogram on the right on Monday. I have him on doxycycline for the osteomyelitis in the left great toe for now. Infectious disease may be necessary 1/17; 2-week hiatus. Patient was admitted to hospital at [place]. My understanding is he underwent an angioplasty of  the right anterior tibial artery and had stents placed in the left anterior artery and the left tibial peroneal trunk. This was done by Dr. Andree Elk of interventional radiology. There is no major change in either 1 of the wounds. They have been using Aquacel Ag. As far as they are aware no imaging studies were done of the foot which is indeed unfortunate. I had him on doxycycline for 2 weeks since we identified the osteomyelitis in the left great toe by plain x-ray. I have renewed that again today. I am still suspicious about osteomyelitis in the amputation site and would consider doing another MRI to compare with the one done in September. The idea of hyperbaric oxygen certainly comes up for discussion 1/24; no major change in either wound area. I have him on doxycycline for osteomyelitis at the tip of the left great toe. As noted he has been previously and recently revascularized by Dr. Andree Elk at [place]. He is tolerating the doxycycline well. For some reason we do not have an infectious disease consult yet. Culture of drainage from  the right foot site last week was negative 1/31; MRI of the right foot did not show osteomyelitis of the right ankle and foot. Notable for a skin ulceration overlying the second metatarsal stump with generalizing soft tissue edema of the ankle and foot consistent with cellulitis. Noted to have a partial-thickness tear of the Achilles tendon 7.5 cm proximal to the insertion Nothing really new in terms of symptoms. Patient's area on the tip of the left great toe is just about closed although he still has the probing area on the metatarsal amputation site. Appointment with Dr. Linus Salmons of infectious disease next week 2/7; Dr. Novella Olive did not feel that any further antibiotics were necessary he would follow-up in 2 months. The left great toe appears to be closed still some surface callus that I gently looked under high did not see anything open or anything that was threatening to be open. He still has the open area on the mid part of his TMA site using endoform 2/14; left great toe is closed and he is completing his doxycycline as of last Sunday. He is not on any antibiotics. Unfortunately out of the right foot his wife noticed some subdermal hemorrhage this week. He had been walking 2 miles I had given him permission to do so this is not really a plantar wound. Using endoform to this wound but I changed to silver alginate this week 2/21; left great toe remains closed. Culture last week grew Streptococcus angiosis which I am not really familiar with however it is penicillin sensitive and I am going to put him on Augmentin. Not much change in the wound on the right foot the deep area is still probing precariously close to bone and the wound on the margin of the TMA is larger. 2/28; left great toe remains closed. He is completing the Augmentin I gave him last week. 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 This is being shown to Dr. Andree Elk at [place] to see if there  is anything else that can be done here. He has been using silver alginate strips on the right 3/6; left great toe remains closed. He sees Dr. Andree Elk on Monday. The area on the plantar aspect of the right foot has the same small orifice with thick callused tissue around this. However this time with removal of the callus tissue the wound is open all the way along the  incision line to the end medially. We have been using silver alginate. 3/13; left toe remains closed although the area is callused. He sees Dr. Jacqualyn Posey of podiatry next week. The area on the plantar right foot looked a lot better this week. Culture I did of this was negative we use silver alginate. He is going next week for an attempt at revascularization by Dr. Andree Elk 3/23; left toe remains closed although the area is callused. He will see Dr. Jacqualyn Posey in follow-up. The area on the right plantar foot continues to look surprisingly better over the last 3 visits. We have been using silver alginate. The revascularization he was supposed to have by Dr. Andree Elk at [facility] in [place] has been canceled [facility] procedure] 4/6; the right foot remains closed albeit callused. Podiatry canceled the appointment with regards to the left great toe. He has not seen Dr. Andree Elk at [facility] but thinks that Dr. Andree Elk has "done all he can do". He would be a candidate for the hemostaemix trial Readmission 07/29/2019 Mr. Stann Mainland is a man we know well from at least 3 previous stays in this clinic. He is a type II diabetic with severe PAD followed by Dr. Andree Elk at [facility] in [place]. 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 So far everything in both of these areas has remained closed. About 2 weeks ago he went to see his podiatrist at [facility] Dr. Babs Bertin. He had a thick callus on the left fifth metatarsal head that was  shaved. He has developed an open wound in this area. They have been offloading this in his diabetic shoes. The patient has not had any more revascularizations by Dr. Andree Elk since the last time he was here. ABI in our clinic at the posterior tibial on the left was 1.06 10/6; no real change in the area on the plantar met head. Small wound with 2 mm of depth. He has thick skin probably from pressure around the wound. We have been using silver alginate 10/13; small wound in the left fifth plantar met head. Arrives today with undermining laterally and purulent drainage. Our intake nurse cultured this. His wife stated they noticed a change in color over the last day or 2. He is not systemically unwell 10/19; small wound on the fifth plantar metatarsal head. Culture I did last week showed Staphylococcus lugdunensis. Although this could be a skin contaminant the possibility of a skin and soft tissue infection was there. I did give him empiric doxycycline which should have covered this. We are using silver alginate to the wound. We put him in a total contact cast today. 10/22; small wound on the fifth plantar metatarsal head. He has completed antibiotics. He also has severe PAD which worries me about just about any wound on this man's foot 10/29; small superficial area on the fifth plantar metatarsal head. Measuring slightly smaller. He is not currently on any antibiotics. I been using a total contact cast in this man with very severe PAD but he seems to be tolerating this well 11/5; left plantar fifth metatarsal head. Silver alginate being used under a total contact cast 11/12; wound not much different than last week. Using a #15 scalpel debridement around the wound. Still silver collagen under a total contact cast. He has severe PAD and that may be playing a role in  this 11/19; disappointing that the wound is not really changed that much. I think it is come down in overall surface area because  originally this was on the lateral part of the fifth metatarsal head however recently it is not really changed. Most of this is filled in but it will not epithelialized there is surface debris on this which may be mostly related to ischemia. They have an appointment with Dr. Andree Elk on 12/9 09/24/2019 on evaluation today patient appears to be doing somewhat better with regard to the wound on the left fifth metatarsal head. Fortunately there does not appear to be any signs of active infection at this time. No fevers, chills, nausea, vomiting, or diarrhea. The wound does not appear to be completely closed and I do feel like the Hydrofera Blue is helping to some degree although I feel like the cast as well as keeping things from breaking down or getting any larger at least. As far as his wound overview it shows that the overall size of the wound is slightly smaller but really maintaining within the realm of about the same. He had no troubles with the cast which is good news. 12/1; left fifth metatarsal head. Perhaps somewhat more vibrant. We have been using Hydrofera Blue. He sees Dr. Andree Elk at [place] 1 week tomorrow. He be back next week and I hope to have a better idea which way the wound is going however I will not cast him next week in case Dr. Andree Elk wants to reevaluate things in his foot. The left leg is the leg with the stent in place and I wonder whether these are going to have to be reevaluated. 12/8; left fifth metatarsal head. Quite a bit better this week. Only a small open area remains. He sees Dr. Andree Elk at [place] tomorrow. Dr. Andree Elk is previously done his vascular interventions. He has 2 stents in the left leg as I remember things. I therefore will not put him in a total contact cast today. He will be using a forefoot off loader. We will use silver collagen on the wound. We will see him again next week 12/15; left fifth metatarsal head. He saw Dr. Andree Elk and is scheduled for an angiogram on  Thursday. Unfortunately although his wound was a lot better last week it is really deteriorated this week. Small punched-out hole with depth and overhanging tissue. We have been using Hydrofera Blue 12/22; patient had an 80% stenosis proximal to the stent I believe in the below-knee popliteal artery. This was opened by Dr. Andree Elk. According the patient the stent itself was satisfactory. I have not been able to review this note. 12/29; patient arrives today with the wound about the same size some undermining medially. We have been using Hydrofera Blue under a total contact cast and that is what we will do again today 11/04/2019. Slightly smaller in orifice but about 4 mm undermining almost circumferentially. We have been using Hydrofera Blue. Apparently the Hydrofera Blue did not stay on the wound after we removed the cast. Some minor looking cast irritation on the right lateral Electronic Signature(s) Signed: 11/04/2019 5:30:44 PM By: [name] MD Entered By: [name] on 11/04/2019 14:57:52 -------------------------------------------------------------------------------- Physical Exam Details Patient Name: Date of Service: JAYVEN, NAILL 11/04/2019 1:45 PM Medical Record [id number] Patient Account Number: [id number] Date of Birth/Sex: Treating RN: 1945-12-11 (73 y.o. [name] Primary Care Provider: [name] Other Clinician: Referring Provider: Treating Provider/Extender:[name], [name], [name] in Treatment: 14 Constitutional Sitting or standing  Blood Pressure is within target range for patient.. Pulse regular and within target range for patient.Marland Kitchen Respirations regular, non-labored and within target range.. Temperature is normal and within the target range for the patient.Marland Kitchen Appears in no distress. Cardiovascular Cannot feel a pedal pulse. Notes Wound exam; no debridement is required. Undermining is still present although I elected not to debride this  this week. Change the primary dressing to endoform we will see if we get any tissue adherence Electronic Signature(s) Signed: 11/04/2019 5:30:44 PM By: [name] MD Entered By: [name] on 11/04/2019 14:58:43 -------------------------------------------------------------------------------- Physician Orders Details Patient Name: Date of Service: TRESEAN, MATTIX 11/04/2019 1:45 PM Medical Record [id number] Patient Account Number: [id number] Date of Birth/Sex: Treating RN: 16-Mar-1946 (74 y.o. Jerilynn Guzman) [name] Primary Care Provider: [name] Other Clinician: Referring Provider: Treating Provider/Extender:Torrence Hammack, [name], [name] in Treatment: 14 Verbal / Phone Orders: No Diagnosis Coding ICD-10 Coding Code Description E11.621 Type 2 diabetes mellitus with foot ulcer E11.51 Type 2 diabetes mellitus with diabetic peripheral angiopathy without gangrene L97.521 Non-pressure chronic ulcer of other part of left foot limited to breakdown of skin L03.116 Cellulitis of left lower limb Follow-up Appointments Return Appointment in 1 week. Dressing Change Frequency Wound #5 Left Metatarsal head fifth Change Dressing every other day. Wound Cleansing Wound #5 Left Metatarsal head fifth May shower and wash wound with soap and water. Primary Wound Dressing Wound #5 Left Metatarsal head fifth Endoform Other: - pad right great toe for protection Off-Loading Total Contact Cast to Left Lower Extremity Electronic Signature(s) Signed: 11/04/2019 5:08:58 PM By: [name] RN Signed: 11/04/2019 5:30:44 PM By: [name] MD Entered By: [name] on 11/04/2019 14:37:50 -------------------------------------------------------------------------------- Problem List Details Patient Name: Date of Service: AZURE, BARRALES 11/04/2019 1:45 PM Medical Record [id number] Patient Account Number: [id number] Date of Birth/Sex: Treating RN: Feb 19, 1946 (73 y.o. Staci Acosta,  Morey Hummingbird Primary Care Provider: [name] Other Clinician: Referring Provider: Treating Provider/Extender:Masai Kidd, [name], [name] in Treatment: 14 Active Problems ICD-10 Evaluated Encounter Code Description Active Date Today Diagnosis E11.621 Type 2 diabetes mellitus with foot ulcer 07/29/2019 No Yes E11.51 Type 2 diabetes mellitus with diabetic peripheral 07/29/2019 No Yes angiopathy without gangrene L97.521 Non-pressure chronic ulcer of other part of left foot 07/29/2019 No Yes limited to breakdown of skin L03.116 Cellulitis of left lower limb 08/12/2019 No Yes Inactive Problems Resolved Problems Electronic Signature(s) Signed: 11/04/2019 5:30:44 PM By: [name] MD Entered By: [name] on 11/04/2019 14:56:38 -------------------------------------------------------------------------------- Progress Note Details Patient Name: Date of Service: MILT, COYE 11/04/2019 1:45 PM Medical Record [id number] Patient Account Number: [id number] Date of Birth/Sex: Treating RN: June 04, 1946 (73 y.o. [name] Primary Care Provider: [name] Other Clinician: Referring Provider: Treating Provider/Extender:Tae Robak, [name], [name] in Treatment: 14 Subjective History of Present Illness (HPI) 05/18/16; this is a 74year-old diabetic who is a type II diabetic on insulin. The history is that he traumatized his right foot developed a sore sometime in late March. Shortly thereafter he went on a cruise but he had to get off the cruise ship in [place] and fly urgently back to [place] where he was admitted to [facility] and ultimately underwent a transmetatarsal amputation by Dr. 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 According to the patient and his wife this wound never really healed. He was seen on 2 occasions in the wound care center in [place] and had vascular studies and then was referred urgently to Dr. Bridgett Larsson of vascular surgery. He  underwent an angiogram  on 05/10/16. Unfortunately nothing really could be done to improve his vascular status. He had a 75-90% stenosis in the midsegment of 1 segment of the posterior femoral artery. He had a patent popliteal, his anterior tibial occluded shortly after takeoff. Perineal had a greater than 90% stenosis posterior tibial is occluded feet had no distal collaterals feed distal aspect of the transmetatarsal amputation site. The patient tells me that he had a prolonged period of Santyl by Dr. Doran Durand was some initial improvement but then this was stopped. I think they're only applying daily dressings/dry dressings. He has not had a recent x-ray of the right foot he did have one before his surgery in April. His wife by the dimensions of the wound/surgical site being followed at home since 4/20. At that point the dimensions were 0.5 x 12 x 0.2 on 7/19 this was 1.8 x 6 x 0.4. He is not currently on any antibiotics. His hemoglobin A1c in early April was 12.9 at that point he was started on insulin. Apparently his blood sugars are much lower he has an appointment with Dr. Legrand Como Alteimer of endocrine next week. 05/29/16 x-ray of the area did not show osteomyelitis. I think he probably needs an MRI at this point. His wife is asking about something called"Yireh" cream which is not FDA approved. I have not heard of this. 06/22/16; MRI did not really suggest osteomyelitis. There was minimal marrow edema and enhancement in the stump of the second metatarsal felt to be secondary likely to postoperative change rather than osteomyelitis. The patient has arranged his own consultation with Dr. Andree Elk at [place], apparently their daughter lives in [place] and has some connection here. Any improvement in vascular supply by Dr. Andree Elk would of course be helpful. Dr. Bridgett Larsson did not feel that anything further could be done other than amputation if wound care did not result in healing or if the area  deteriorates. 06/26/16; the patient has been to see Dr. Andree Elk at [place] and had an angiogram. He is going for a procedure on Thursday which will involve catheterization. I'm not sure if this is an anterograde or retrograde approach. He has been using Santyl to the wound 07/10/16; the patient had a repeat angiogram and angioplasty at [facility] by Dr. Brunetta Jeans. His angiogram showed right CFA and profundal widely patent. The right as of a.m. popliteal artery were widely patent the right anterior tibial was occluded proximally and reconstitutes at the ankle. Peroneal artery was patent to the foot. Posterior tibial artery was occluded. The patient had angioplasty of the anterior tibial artery.. This was quite successful. He was recommended for Plavix as well as aspirin. 07/17/16; the patient was close to be a nurse visit today however the outer dressing of the Apligraf fell off. Noted drainage. I was asked to see the wound. The patient is noted an odor however his wife had noted that. Drainage with Apligraf not necessarily a bad thing. He has not been systemically unwell 07/24/16; we are still have an issue with drainage of this wound. In spite of this I applied his second Apligraf. Medially the area still is probing to bone. 08/07/16; Apligraf reapplied in general wound looks improved. 08/21/16 Apligraf #4. Wound looks much better 09/04/16 patientt's wound again today continues to appear to improve with the application of the Apligraf's. He notes no increased discomfort or concerns at this point in time. 09/18/16; the patient returns today 2 weeks after his fifth application of Apligraf. Predictably three quarters of the width  of this wound has healed. The deep area that probe to bone medially is still open. The patient asked how much out-of-pocket dollars would be for additional Apligraf's. 09/25/16; now using Hydrofera Blue. He has completed 5 Apligraf applications with considerable improvement in this deep  open transmetatarsal amputation site. His wound is now a triangular-shaped wound on the medial aspect. At roughly 12 to 2:00 this probes another centimeter but as opposed to in the past this does not probe to bone. The patient has been seen at [place] by Dr. Zenia Resides. He is not planning to do any more revascularization unless the wound stalls or worsens per the patient 10/02/16; 0.7 x 0.8 x 0.8. Unfortunately although the wound looks stable to improved. There is now easily probable bone. This hasn't been present for several weeks. Patient is not otherwise symptomatic he is not experiencing any pain. I did a culture of the wound bed 10/09/16. Deterioration last week. Culture grew MRSA and although there is improvement here with doxycycline prescribed over the phone I'm going to try to get him linezolid 600 twice a day for 10 days today. 10/16/16; he is completing a weeks worth of linezolid and still has 3 more days to go. Small triangular-shaped open area with some degree of undermining. There is still palpable bone with a curet. Overall the area appears better than last week 10/20/16 he has completed the linezolid still having some nausea and vomiting but no diarrhea. He has exposed bone this week which is a deterioration. 10/27/16 patient now has a small but probing wound down to bone. Culture of this bone that I did last week showed a few methicillin-resistant staph aureus. I have little doubt that this represents acute/subacute osteomyelitis. The patient is currently on Doxy which I will continue he also completed 10 days of linezolid. We are now in a difficult situation with this patient's foot after considerable discussion we will send him back to see Dr. Doran Durand for a surgical opinion of this I'm also going to try to arrange a infectious disease consult at [facility] hopefully week and get this prior to her usual 4-6 weeks we having [place]. The patient clearly is going to need 6 weeks of IV  vancomycin. If we cannot arrange this expediently I'll have to consider ordering this myself through a home infusion company 11/03/16; the patient now has a small in terms of circumference but probing wound. No bone palpable today. The patient remains on doxycycline 100 twice a day which should support him until he sees infectious disease at [facility] next week the following week on Wednesday I believe he has an appointment with Dr. Andree Elk at [facility] who is his vascular cardiologist. Finally he has an appointment with Dr. Doran Durand on 11/22/16 we have been using silver alginate. The patient's wife states they are having trouble getting this through [facility] 11/13/16; the patient was seen by infectious disease at [facility] in the 11th PICC line placed in preparation for IV antibiotics. A tummy he has not going to get IV vancomycin o Ceftaroline. They've also ordered an MRI. Patient has a follow-up with Dr. Doran Durand on 11/22/16 and Dr. Andree Elk at [facility] tomorrow 11/23/16 the patient is on daptomycin as directed by infectious disease at [facility]. He is also been back to see Dr. Andree Elk at [facility]. He underwent a repeat arteriogram. He had a successful PTA of the right anterior tibial artery. He is on dual antiplatelete treatment with Plavix and aspirin. Finally he had the MRI of  his foot in [place]. This showed cellulitis about the foot worse distally edema and enhancement in the reminiscent of the second metatarsal was consistent with osteomyelitis therefore what I was assuming to be the first metatarsal may be actually the second. He also has a fluid collection deep to the calcaneus at the level of the calcaneal spur which could be an abscess or due to adventitial bursitis. He had a small tear in his Achilles 11/30/16; the patient continues on daptomycin as directed by infectious disease at [facility]. He is been revascularized by Dr. Andree Elk at [facility] in [place]. He has been to see Gretta Arab who was the  orthopedic surgeon who did his original amputation. I have not seen his not however per the patient's wife he did not offer another surgical local surgical prodecure to remove involved bone. He verbalized his usual disbelief in not just hyperbarics but any medical therapy for this condition(osteomyelitis). I discussed this in detail with the patient today including answering the question about a BKA definitively "curing" the current condition. 12/07/16; the patient continues on daptomycin as directed by infectious disease at [facility]. This is directed at the MRSA that we cultured from his bone debridement from 12/22. Lab work today shows a white count of 8.7 hemoglobin of 10.5 which is microcytic and hypochromic differential count shows a slightly elevated monocyte count at 1.2 eosinophilic count of 0.6. His creatinine is 1.11 sedimentation rate apparently is gone from 35-34 now 40. I explained was wife I don't think this represents a trend. His total CK is 48 12/14/16- patient is here for follow-up evaluation of his right TMA site. He continues to receive IV daptomycin per infectious disease. His serum inflammatory markers remain elevated. He complains of intermittent pain to the medial aspect of the TMA site with intermittent erythema. He voices no complaints or concerns regarding hyperbaric therapy. Overall he and his wife are expressing a frustration and discouragement regarrding the length of time of treatment. 12/21/16; small open wound at roughly the first or second metatarsal metatarsalphalyngeal joint reminiscence of his transmetatarsal amputation site he continues to receive IV daptomycin per infectious disease at [facility]. He will finish these a week tomorrow. He has lab work which I been copied on. His white count is 10.8 hemoglobin 8.9 MCV is low at 75., MCH low at 24.5 platelet count slightly elevated at 626. Differential count shows 70% neutrophils 10% monocytes and 10% eosinophils. His  comprehensive metabolic panel shows a slightly low sodium at 133 albumin low at 3.1 total CK is normal at 42 sedimentation rate is much higher at 82. He has had iron studies that show a serum iron of 15 and iron binding capacity of 238 and iron saturation of 6. This is suggestive of iron deficiency. B12 and folate were normal ferritin at 113 The patient tells me that he is not eating well and he has lost weight. He feels episodically nauseated. He is coughing and gagging on mucus which she thinks is sinusitis. He has an appointment with his primary doctor at 5:00 this afternoon in [facility] 01/02/17; the patient developed a subacute pneumonitis. He was admitted to [facility] after a CT scan showed an extensive interstitial pneumonitis [I have not yet seen this]. 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 He has since been discharged. He is not on oxygen. He feels fatigued and very short of breath with exertion. At [facility] the  wound care nurse there felt that his wound was healed. He did complete his daptomycin and has follow-up with infectious disease on Friday. X-rays I did before he went to [facility] still suggested residual osteomyelitis in the anterior aspect of the second must metatarsal head. I'm not sure I would've expected any different. There was no other findings. I actually think I did this because of erythema over the first metatarsal head reminiscent 01/11/17; the patient has eosinophilic pneumonitis. He has been reviewed by pulmonology and given clearance for hyperbarics at least that's what his wife says. I'll need to see if there is note in care everywhere. Apparently the prognosis for improvement of daptomycin induced eosinophilic granulocyte is is 3 months without steroids. In the meantime infectious disease has placed him on doxycycline until the wound is closed. He is still  is lost a lot of weight and his blood sugars are running in the mid 60s to low 80s fasting and at all times during the day 01/18/17; he has had adjustments in his insulin apparently his blood sugars in the morning or over 100. He wants to restart his hyperbaric treatment we'll do this at 1:00. 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 I think there is good reason to complete his treatments in order to give him the best chance of maintaining a healed status and these DFU 3 wounds with MRSA infection in the bone 02/15/17; the patient was seen today in conjunction with HBO. He completed hyperbaric oxygen today. The open area on his transmetatarsal site has remained closed. There was an area of erythema when I saw him earlier in the week on the posterior heel although that is resolved as of today as well. He has been using a cam walker. This is a patient who came to [place] after a transmetatarsal amputation that was necrotic and dehisced. He required revascularization percutaneously on 2 different occasions by Dr. Andree Elk of invasive cardiology at [facility]. 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 He went to [facility] infectious disease and had IV daptomycin for 5 weeks before developing eosinophilic pneumonitis and requiring an admission to hospital/ICU. He made a good recovery and is continued on doxycycline since. As mentioned his foot is closed now. He has a follow-up with Dr. Andree Elk tomorrow. He is going to [employer] on Monday for a custom-made shoe READMISSION Last visit Dr. Andree Elk [facility] 02/16/17 1. Critical limb ischemia of the RLE:  s/p transmetatarsal amputation of the RLE, now completely healed.  s/p right ATA percutaneous revascularization procedure x2, most recently 11/20/2016 s/p PTA of right AT 100%  to less than 20% with a 2.5 x 200 balloon.  He does have some residual osteomyelitis, but given the wound is closed, the orthopedist has recommended to follow. He has a fitting for a special shoe coming up next week. He has completed a course of daptomycin and doxycycline and he has been discharged by ID. He has completed a course of hyperbaric therapy.  Continue Continue medical management with aspirin, Plavix and statin therapy. We will discuss ongoing Plavix therapy at follow-up in 6 months.  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 Given no symptoms, we will conservatively manage. He doesn't want to do any more invasive studies  at this time, which is reasonable. The patient arrives today out of 2 concerns both on the right transmetatarsal site. 1 at the level of the reminiscent fifth metatarsal head and the other at roughly the first or second. Both of these look like dark subcutaneous discoloration probably subdermal bleeding. He is recently obtained new adaptive footwear for the right foot. He also has a callus on the left fifth dorsal toe however he follows with podiatry for this and I don't think this is any issue. ABIs in this clinic today were 0.66 on the right and 1.06 on the left. On entrance into our clinic initially this was 0.95 and 0.92. He does not describe current claudication. They're going away on a cruise in 8 weeks and I think are trying to do the month is much as they can proactively. They follow with podiatry and have an appointment with Dr. Andree Elk in October READMISSION 06/25/18 This is a patient that we have not seen in almost a year. He is a type II diabetic with known PAD. He is followed by Dr. Andree Elk of interventional cardiology at [facility] in [place]. He is required revascularization for significant PAD. When we first saw him he  required a transmetatarsal amputation. He had underlying osteomyelitis with a nonhealing surgical wound. This eventually closed with wound care, IV antibiotics and hyperbaric oxygen. They tell me that he has a modified shoe and he is very active walking up to 4 miles a day. He is followed by Dr. Geroge Baseman of podiatry. His wife states that he underwent a removal of callus over this site on July 9. She felt there may be drainage from this site after that although she could never really determined and there was callus buildup again. On 06/01/18 there was pressure bleeding through the overlying callus. he was given a prescription for 7 days of Bactrim. Fortuitously he has an appointment with Dr. Andree Elk on 06/04/18 and he immediately underwent revascularization of the right leg although I have not had a chance to review these records in care everywhere. This was apparently done through anterior and retrograde access. On 06/06/18 he had another debridement by Dr. Bernette Mayers. Vitamin soaking with Epsom salts for 20 minutes and applying calcium alginate. The original trans-met was on April 2017 I believe by Dr. Doran Durand. His ABI in our clinic was noncompressible today. 07/02/18; x-ray I ordered last week was negative for osteomyelitis. Swab culture was also negative. He is going to require an MRI which I have ordered today. 07/09/18; surprisingly the MRI of the foot that I ordered did not show osteomyelitis. He did suggest the possibility of cellulitis. For this reason I'll go ahead and give him a 10 day course of doxycycline. Although the previous culture of this area was negative 07/16/18; it arrives with the wound looking much the same. Roughly the same depth. He has thick subcutaneous tissue around the wound orifice but this still has roughly the same depth. Been using silver alginate. We applied Oasis #1 today 07/23/2018; still having to remove a lot of callus and thick subcutaneous tissue to actually define the wound  here. Most of this seems to have closed down yet he has a comma shaped divot over the top of the area that still I think is open. We applied Oasis #2 His wife expressed concern about the tip of his left great toe. This almost looks like a small blister. She also showed it today to her podiatrist Dr. Geroge Baseman who did not think this  was anything serious. I am not sure is anything serious either however I think it bears some watching. He is not in any pain however he is insensate 07/30/2018;; still on a lot of nonviable tissue over the surface of the wound however cleaning this up reveals a more substantial wound orifice but less of a probing wound depth. This is not probed to bone. There is no evidence of infection The wife is still concerned about a non-open area on the tip of his left great toe. Almost feels like a bony outgrowth. She had previously showed this to podiatry. I do not think this is a blister. A friction area would be possible although he is really not walking according to his wife. He had a small skin tag on his right buttock but no open wound here either 08/06/2018; we applied a third Oasis last week. Unfortunately there is really no improvement. Still requiring extensive debridement to expose the wound bed from a horizontal slitlike depression. I still have not been able to get this to fill in properly. On the positive side there is now no probable bone from when he first came into the facility. I changed him to silver alginate today after a reasonably aggressive debridement 08/13/2018; once again the patient comes in with skin and subcutaneous tissue closing over the small probing area with the underlying cavity of the wound on the right TMA site. I applied silver alginate to this last week. Prior to that we used Oasis x3 still not able to get this area granulating. He does not have a probing area of the bone which is an improvement from when I spur started working on this and an  MRI did not suggest osteomyelitis. I do not see evidence of infection here but I am increasingly concerned about why I cannot get this area to granulate. Each time I debrided this is looks like this is simply a matter of getting granulation to fill in the hole and then getting epithelialization. This does not seem to happen Also I sent him back to see podiatry Dr. Earleen Newport about the what felt to be bony outgrowth on the tip of his left great toe. Apparently after heel he left the clinic last week or the next day he developed a blood blister. He did see Dr. Earleen Newport. He went on to have a debridement of the medial nail cuticle he now has an open area here as well as some denuded skin. They did an x-ray apparently does have a bony outgrowth or spur but I am not able to look at this. They are using topical antibiotics apparently there was some suggested he use Santyl. Patient's wife was anxious for my opinion of this 08/20/2018; we are able to keep the wound open this time instead of the thick subcutaneous tissue closing over the top of it however unfortunately once again this probes to bone. I do not see any evidence of infection and previous MRI did not show osteomyelitis. I elected to go back to the Oasis to see if we can stimulate some granulation. Last saw his vascular interventional cardiologist Dr. Andree Elk at the beginning of August and he had a repeat procedure. Nevertheless I wonder how much blood flow he has down to this area With regards to the left first toe he is seeing Dr. Earleen Newport next week. They are applying Bactroban to this area. 08/27/2018 ooOnce again he comes in with thick eschar and subcutaneous tissue over the top of the small probing hole.  This does not appear to go down to bone but it still has roughly the same depth. I reapplied Oasis today ooOver the left great toe there appears to be more of the wound at the tip of his toe than there was last week. This has an eschar on the  surface of it. Will change to Santyl. There is seeing podiatry this afternoon 09/03/2018 ooHe comes in today with the area on the transmetatarsal's site with a fair amount of callus, nonviable tissue over the circumference but it was not closed. I removed all of this as well as some subcutaneous debris and reapplied Oasis. There is no exposed bone ooThe area over the tip of the left great toe started off as a nodule of uncertain etiology. He has been followed with podiatry. They have been removing part of the medial nail bed. He has nonviable tissue over the wound he been using Santyl in this area 09/10/18 ooUnfortunately comes in with neither wound area looking improved. The transmetatarsal amputation site once Again has nonviable debris over the surface requiring debridement. Unfortunately underneath this there is nothing that looks viable and this once again goes right down to bone. There is no purulent drainage and no erythema. ooAlso the surgical wound from podiatry on the left first toe has an ischemic-looking eschar over the surface of the tip of the toe I have elected not to attempt candidly debride this ooHis wife as arranged for him to have follow-up noninvasive studies in [place] under the care of Dr. Andree Elk clinic. The question is he has known severe PAD. They had recently seen him in August. He has had revascularizations in both legs within the last 4 or 5 months. He is not complaining of pain and I cannot really get a history of claudication. He has not systemically unwell 09/20/2018 the patient has been to [facility] and been revascularized by Dr. Andree Elk earlier this week. Apparently he was able to open up the anterior tibial artery although I have not actually seen his formal report. He is going for an attempt to revascularize on the left on Monday. He is apparently working with an investigational stent for lower extremity arteries below the knee and he has talked to the  patient about placing that on Monday if possible. The patient has been using silver alginate on the transmetatarsal amputation site and Santyl on the left 09/30/2018; patient had his revascularization on the left this apparently included a standard approach as well as a more distal arterial catheterization although I do not have any information on this from Dr. Andree Elk. In fact I do not even see the initial revascularization that he had on the right. I have included the arterial history from Dr. Andree Elk last note however below; ASSESSMENT/PLAN: 1. Hx of Critical limb ischemia bilateral lower extremities, PAD: -s/p transmetatarsal amputation of the RLE. -s/p right ATA percutaneous revascularization procedure x2, most recently 11/20/2016 s/p PTA of right AT 100% to less than 20% with a 2.5 x 200 balloon. 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 -03/21/2018 s/p PTA of 90% left popliteal artery to <10% with a 5x20 cutting balloon, PTA of 90% left peroneal to <20% with a 3x20 balloon, PTA of 100% left AT to <20% with a 2.5x220 balloon. -Considering he has bilateral lower extremity CLI on the right foot and L great toe, we will plan on abdominal aortogram  focusing on the right lower extremity via left common femoral access. Will plan on the LLE soon after. Risks/benefits of procedure have been discussed and patient has elected to proceed. We have been using endoform to the right TMA amputation site wound and Santyl to the left great toe 10/07/2018; the area on the tip of his left great toe looked better we have been using Santyl here. We continue to have a very difficult probing hole on the right TMA amputation site we have been using endoform. I went on to use his fifth Oasis today 10/14/2018; the tip of the left great toe continues to look better. We have been using Santyl  here the surface however is healthy and I think we can change to an alginate. The right TMA has not changed. Once again he has no superficial opening there is callus and thick subcutaneous tissue over the orifice once you remove this there is the probing area that we have been dealing with without too much change. There is no palpable bone I have been placing Oasis here and put Oasis #6 in this today after a more vigorous debridement 10/21/18; the left great toe still has necrotic surface requiring debridement. The right TMA site hasn't changed in view of the thick callus over the wound bed. With removal of this there is still the opening however this does not appear to have the same depth. Again there is no palpable bone. Oasis was replaced 10/28/2018; patient comes in with both wounds looking worse. The area over the first toe tip is now down to bone. The area over the TMA site is deeper and down to bone clearly with a increase in overall wound area. Equally concerning on the right TMA is the complete absence of a pulse this week which is a change. We are not even able to Doppler this. On the right he has a noncompressible ABI greater than 1.4 11/04/2018. Both wounds look somewhat worse. X-rays showed no osteomyelitis of the right foot but on the left there was underlying osteomyelitis in the left great toe distal phalanx. I been on the phone to Dr. Andree Elk surface at [facility] in [place] and they are arranging for another angiogram on the right on Monday. I have him on doxycycline for the osteomyelitis in the left great toe for now. Infectious disease may be necessary 1/17; 2-week hiatus. Patient was admitted to hospital at [place]. My understanding is he underwent an angioplasty of the right anterior tibial artery and had stents placed in the left anterior artery and the left tibial peroneal trunk. This was done by Dr. Andree Elk of interventional radiology. There is no major change in either 1 of the  wounds. They have been using Aquacel Ag. As far as they are aware no imaging studies were done of the foot which is indeed unfortunate. I had him on doxycycline for 2 weeks since we identified the osteomyelitis in the left great toe by plain x-ray. I have renewed that again today. I am still suspicious about osteomyelitis in the amputation site and would consider doing another MRI to compare with the one done in September. The idea of hyperbaric oxygen certainly comes up for discussion 1/24; no major change in either wound area. I have him on doxycycline for osteomyelitis at the tip of the left great toe. As noted he has been previously and recently revascularized by Dr. Andree Elk at [place] [place]. He is tolerating the doxycycline well. For some reason we do not have an infectious  disease consult yet. Culture of drainage from the right foot site last week was negative 1/31; MRI of the right foot did not show osteomyelitis of the right ankle and foot. Notable for a skin ulceration overlying the second metatarsal stump with generalizing soft tissue edema of the ankle and foot consistent with cellulitis. Noted to have a partial-thickness tear of the Achilles tendon 7.5 cm proximal to the insertion Nothing really new in terms of symptoms. Patient's area on the tip of the left great toe is just about closed although he still has the probing area on the metatarsal amputation site. Appointment with Dr. Linus Salmons of infectious disease next week 2/7; Dr. Novella Olive did not feel that any further antibiotics were necessary he would follow-up in 2 months. The left great toe appears to be closed still some surface callus that I gently looked under high did not see anything open or anything that was threatening to be open. He still has the open area on the mid part of his TMA site using endoform 2/14; left great toe is closed and he is completing his doxycycline as of last Sunday. He is not on any antibiotics. Unfortunately out of  the right foot his wife noticed some subdermal hemorrhage this week. He had been walking 2 miles I had given him permission to do so this is not really a plantar wound. Using endoform to this wound but I changed to silver alginate this week 2/21; left great toe remains closed. Culture last week grew Streptococcus angiosis which I am not really familiar with however it is penicillin sensitive and I am going to put him on Augmentin. Not much change in the wound on the right foot the deep area is still probing precariously close to bone and the wound on the margin of the TMA is larger. 2/28; left great toe remains closed. He is completing the Augmentin I gave him last week. 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 This is being shown to Dr. Andree Elk at [place] to see if there is anything else that can be done here. He has been using silver alginate strips on the right 3/6; left great toe remains closed. He sees Dr. Andree Elk on Monday. The area on the plantar aspect of the right foot has the same small orifice with thick callused tissue around this. However this time with removal of the callus tissue the wound is open all the way along the incision line to the end medially. We have been using silver alginate. 3/13; left toe remains closed although the area is callused. He sees Dr. Jacqualyn Posey of podiatry next week. The area on the plantar right foot looked a lot better this week. Culture I did of this was negative we use silver alginate. He is going next week for an attempt at revascularization by Dr. Andree Elk 3/23; left toe remains closed although the area is callused. He will see Dr. Jacqualyn Posey in follow-up. The area on the right plantar foot continues to look surprisingly better over the last 3 visits. We have been using silver alginate. The revascularization he was supposed to have by Dr. Andree Elk at [facility] in [place] has been canceled [facility] procedure] 4/6; the right foot  remains closed albeit callused. Podiatry canceled the appointment with regards to the left great toe. He has not seen Dr. Andree Elk at [facility] but thinks that Dr. Andree Elk has "done all he can do". He would be a candidate for the hemostaemix trial Readmission 07/29/2019  Mr. Stann Mainland is a man we know well from at least 3 previous stays in this clinic. He is a type II diabetic with severe PAD followed by Dr. Andree Elk at [facility] in [place]. 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 So far everything in both of these areas has remained closed. About 2 weeks ago he went to see his podiatrist at [facility] Dr. Babs Bertin. He had a thick callus on the left fifth metatarsal head that was shaved. He has developed an open wound in this area. They have been offloading this in his diabetic shoes. The patient has not had any more revascularizations by Dr. Andree Elk since the last time he was here. ABI in our clinic at the posterior tibial on the left was 1.06 10/6; no real change in the area on the plantar met head. Small wound with 2 mm of depth. He has thick skin probably from pressure around the wound. We have been using silver alginate 10/13; small wound in the left fifth plantar met head. Arrives today with undermining laterally and purulent drainage. Our intake nurse cultured this. His wife stated they noticed a change in color over the last day or 2. He is not systemically unwell 10/19; small wound on the fifth plantar metatarsal head. Culture I did last week showed Staphylococcus lugdunensis. Although this could be a skin contaminant the possibility of a skin and soft tissue infection was there. I did give him empiric doxycycline which should have covered this. We are using silver alginate to the wound. We put him in a total contact cast today. 10/22; small wound on the fifth plantar  metatarsal head. He has completed antibiotics. He also has severe PAD which worries me about just about any wound on this man's foot 10/29; small superficial area on the fifth plantar metatarsal head. Measuring slightly smaller. He is not currently on any antibiotics. I been using a total contact cast in this man with very severe PAD but he seems to be tolerating this well 11/5; left plantar fifth metatarsal head. Silver alginate being used under a total contact cast 11/12; wound not much different than last week. Using a #15 scalpel debridement around the wound. Still silver collagen under a total contact cast. He has severe PAD and that may be playing a role in this 11/19; disappointing that the wound is not really changed that much. I think it is come down in overall surface area because originally this was on the lateral part of the fifth metatarsal head however recently it is not really changed. Most of this is filled in but it will not epithelialized there is surface debris on this which may be mostly related to ischemia. They have an appointment with Dr. Andree Elk on 12/9 09/24/2019 on evaluation today patient appears to be doing somewhat better with regard to the wound on the left fifth metatarsal head. Fortunately there does not appear to be any signs of active infection at this time. No fevers, chills, nausea, vomiting, or diarrhea. The wound does not appear to be completely closed and I do feel like the Hydrofera Blue is helping to some degree although I feel like the cast as well as keeping things from breaking down or getting any larger at least. As far as his wound overview it shows that the overall size of the wound is  slightly smaller but really maintaining within the realm of about the same. He had no troubles with the cast which is good news. 12/1; left fifth metatarsal head. Perhaps somewhat more vibrant. We have been using Hydrofera Blue. He sees Dr. Andree Elk at [place] 1 week tomorrow.  He be back next week and I hope to have a better idea which way the wound is going however I will not cast him next week in case Dr. Andree Elk wants to reevaluate things in his foot. The left leg is the leg with the stent in place and I wonder whether these are going to have to be reevaluated. 12/8; left fifth metatarsal head. Quite a bit better this week. Only a small open area remains. He sees Dr. Andree Elk at [place] tomorrow. Dr. Andree Elk is previously done his vascular interventions. He has 2 stents in the left leg as I remember things. I therefore will not put him in a total contact cast today. He will be using a forefoot off loader. We will use silver collagen on the wound. We will see him again next week 12/15; left fifth metatarsal head. He saw Dr. Andree Elk and is scheduled for an angiogram on Thursday. Unfortunately although his wound was a lot better last week it is really deteriorated this week. Small punched-out hole with depth and overhanging tissue. We have been using Hydrofera Blue 12/22; patient had an 80% stenosis proximal to the stent I believe in the below-knee popliteal artery. This was opened by Dr. Andree Elk. According the patient the stent itself was satisfactory. I have not been able to review this note. 12/29; patient arrives today with the wound about the same size some undermining medially. We have been using Hydrofera Blue under a total contact cast and that is what we will do again today 11/04/2019. Slightly smaller in orifice but about 4 mm undermining almost circumferentially. We have been using Hydrofera Blue. Apparently the Hydrofera Blue did not stay on the wound after we removed the cast. Some minor looking cast irritation on the right lateral Objective Constitutional Sitting or standing Blood Pressure is within target range for patient.. Pulse regular and within target range for patient.Marland Kitchen Respirations regular, non-labored and within target range.. Temperature is normal and within  the target range for the patient.Marland Kitchen Appears in no distress. Vitals Time Taken: 2:08 PM, Height: 69 in, Weight: 210 lbs, BMI: 31, Temperature: 98.6 F, Pulse: 75 bpm, Respiratory Rate: 18 breaths/min, Blood Pressure: 113/57 mmHg, Capillary Blood Glucose: 121 mg/dl. General Notes: glucose per pt report Cardiovascular Cannot feel a pedal pulse. General Notes: Wound exam; no debridement is required. Undermining is still present although I elected not to debride this this week. Change the primary dressing to endoform we will see if we get any tissue adherence Integumentary (Hair, Skin) Wound #5 status is Open. Original cause of wound was Gradually Appeared. The wound is located on the Left Metatarsal head fifth. The wound measures 0.4cm length x 0.5cm width x 0.2cm depth; 0.157cm^2 area and 0.031cm^3 volume. There is Fat Layer (Subcutaneous Tissue) Exposed exposed. There is no tunneling noted, however, there is undermining starting at 12:00 and ending at 12:00 with a maximum distance of 0.4cm. There is a medium amount of serosanguineous drainage noted. The wound margin is thickened. There is medium (34-66%) pink, pale granulation within the wound bed. There is a medium (34-66%) amount of necrotic tissue within the wound bed including Adherent Slough. Assessment Active Problems ICD-10 Type 2 diabetes mellitus with foot ulcer Type 2  diabetes mellitus with diabetic peripheral angiopathy without gangrene Non-pressure chronic ulcer of other part of left foot limited to breakdown of skin Cellulitis of left lower limb Procedures Wound #5 Pre-procedure diagnosis of Wound #5 is a Diabetic Wound/Ulcer of the Lower Extremity located on the Left Metatarsal head fifth . There was a Total Contact Cast Procedure by Ricard Dillon., MD. Post procedure Diagnosis Wound #5: Same as Pre-Procedure Plan Follow-up Appointments: Return Appointment in 1 week. Dressing Change Frequency: Wound #5 Left Metatarsal  head fifth: Change Dressing every other day. Wound Cleansing: Wound #5 Left Metatarsal head fifth: May shower and wash wound with soap and water. Primary Wound Dressing: Wound #5 Left Metatarsal head fifth: Endoform Other: - pad right great toe for protection Off-Loading: Total Contact Cast to Left Lower Extremity 1. The wound is stable to improved. 2. I did not debride today I am going to see if we can get some tissue adherence change the primary dressing to endoform. Could have considered Iodoflexoo 3. Total contact cast applied in the standard fashion. Electronic Signature(s) Signed: 11/04/2019 5:30:44 PM By: [name] MD Entered By: [name] on 11/04/2019 14:59:32 -------------------------------------------------------------------------------- Total Contact Cast Details Patient Name: Date of Service: IRBIN, FINES 11/04/2019 1:45 PM Medical Record [id number] Patient Account Number: [id number] Date of Birth/Sex: Treating RN: 06-01-1946 (74 y.o. Jerilynn Guzman) [name] Primary Care Provider: [name] Other Clinician: Referring Provider: Treating Provider/Extender:[name], [name], [name] in Treatment: 14 Total Contact Cast Applied for Wound Assessment: Wound #5 Left Metatarsal head fifth Performed By: Physician Ricard Dillon., MD Post Procedure Diagnosis Same as Pre-procedure Electronic Signature(s) Signed: 11/04/2019 5:30:44 PM By: [name] MD Entered By: [name] on 11/04/2019 15:02:50 -------------------------------------------------------------------------------- SuperBill Details Patient Name: Date of Service: HALIL, RENTZ 11/04/2019 Medical Record [id number] Patient Account Number: [id number] Date of Birth/Sex: Treating RN: 01-05-1946 (74 y.o. Jerilynn Guzman) [name] Primary Care Provider: [name] Other Clinician: Referring Provider: Treating Provider/Extender:[name], [name], [name] in Treatment:  14 Diagnosis Coding ICD-10 Codes Code Description E11.621 Type 2 diabetes mellitus with foot ulcer E11.51 Type 2 diabetes mellitus with diabetic peripheral angiopathy without gangrene L97.521 Non-pressure chronic ulcer of other part of left foot limited to breakdown of skin L03.116 Cellulitis of left lower limb Facility Procedures CPT4 Code Description: [id number] - APPLY TOTAL CONTACT LEG CAST ICD-10 Diagnosis Description L97.521 Non-pressure chronic ulcer of other part of left foot limite Modifier: d to breakdo Quantity: 1 wn of skin Physician Procedures CPT4 Code Description: [id number] - WC PHYS APPLY TOTAL CONTACT CAST ICD-10 Diagnosis Description L97.521 Non-pressure chronic ulcer of other part of left foot limited Modifier: to breakdow Quantity: 1 n of skin Electronic Signature(s) Signed: 11/04/2019 5:30:44 PM By: [name] MD Entered By: [name] on 11/04/2019 15:04:06

## 2019-11-11 ENCOUNTER — Encounter (HOSPITAL_BASED_OUTPATIENT_CLINIC_OR_DEPARTMENT_OTHER): Payer: Medicare Other | Admitting: Internal Medicine

## 2019-11-11 ENCOUNTER — Other Ambulatory Visit: Payer: Self-pay

## 2019-11-11 DIAGNOSIS — E11621 Type 2 diabetes mellitus with foot ulcer: Secondary | ICD-10-CM | POA: Diagnosis not present

## 2019-11-11 NOTE — Progress Notes (Signed)
IMANOL, BIHL ([id number]) Visit Report for 11/11/2019 Debridement Details Patient Name: Date of Service: NYKEEM, CITRO 11/11/2019 1:30 PM Medical Record [id number] Patient Account Number: [id number] Date of Birth/Sex: 07/12/1946 (74 y.o. M) Treating RN: Primary Care Provider: [name] Other Clinician: Referring Provider: Treating Provider/Extender:Undine Nealis, [name], [name] in Treatment: 15 Debridement Performed for Wound #5 Left Metatarsal head fifth Assessment: Performed By: Physician Ricard Dillon., MD Debridement Type: Debridement Severity of Tissue Pre Fat layer exposed Debridement: Level of Consciousness (Pre- Awake and Alert procedure): Pre-procedure Verification/Time Out Taken: Yes - 15:15 Start Time: 15:15 Pain Control: Lidocaine 5% topical ointment Total Area Debrided (L x W): 0.5 (cm) x 0.5 (cm) = 0.25 (cm) Tissue and other material Viable, Non-Viable, Slough, Subcutaneous, Slough debrided: Level: Skin/Subcutaneous Tissue Debridement Description: Excisional Instrument: Blade, Forceps Specimen: Tissue Culture Number of Specimens Taken: 1 Bleeding: Minimum Hemostasis Achieved: Pressure End Time: 15:19 Procedural Pain: 0 Post Procedural Pain: 0 Response to Treatment: Procedure was tolerated well Level of Consciousness Awake and Alert (Post-procedure): Post Debridement Measurements of Total Wound Length: (cm) 0.5 Width: (cm) 0.5 Depth: (cm) 0.4 Volume: (cm) 0.079 Character of Wound/Ulcer Post Improved Debridement: Severity of Tissue Post Debridement: Fat layer exposed Post Procedure Diagnosis Same as Pre-procedure Electronic Signature(s) Signed: 11/11/2019 6:16:53 PM By: [name] MD Entered By: [name] on 11/11/2019 15:21:28 -------------------------------------------------------------------------------- HPI Details Patient Name: Date of Service: RYIN, AMBROSIUS 11/11/2019 1:30 PM Medical Record [id number]  Patient Account Number: [id number] Date of Birth/Sex: Treating RN: 1946-06-28 (74 y.o. M) Primary Care Provider: [name] Other Clinician: Referring Provider: Treating Provider/Extender:Moyinoluwa Dawe, [name], [name] in Treatment: 15 History of Present Illness HPI Description: 05/18/16; this is a 74year-old diabetic who is a type II diabetic on insulin. The history is that he traumatized his right foot developed a sore sometime in late March. 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 According to the patient and his wife this wound never really healed. He was seen on 2 occasions in the wound care center in [place] and had vascular studies and then was referred urgently to Dr. Bridgett Larsson of vascular surgery. He underwent an angiogram on 05/10/16. Unfortunately nothing really could be done to improve his vascular status. He had a 75-90% stenosis in the midsegment of 1 segment of the posterior femoral artery. He had a patent popliteal, his anterior tibial occluded shortly after takeoff. Perineal had a greater than 90% stenosis posterior tibial is occluded feet had no distal collaterals feed distal aspect of the transmetatarsal amputation site. The patient tells me that he had a prolonged period of Santyl by Dr. Doran Durand was some initial improvement but then this was stopped. I think they're only applying daily dressings/dry dressings. He has not had a recent x-ray of the right foot he did have one before his surgery in April. His wife by the dimensions of the wound/surgical site being followed at home since 4/20. At that point the dimensions were 0.5 x 12 x 0.2 on 7/19 this was 1.8 x 6 x 0.4. He is not currently on any antibiotics. His hemoglobin A1c in early April  was 12.9 at that point he was started on insulin. Apparently his blood sugars are much lower he has an appointment with Dr. Legrand Como Alteimer of endocrine next week.  05/29/16 x-ray of the area did not show osteomyelitis. I think he probably needs an MRI at this point. His wife is asking about something called"Yireh" cream which is not FDA approved. I have not heard of this. 06/22/16; MRI did not really suggest osteomyelitis. There was minimal marrow edema and enhancement in the stump of the second metatarsal felt to be secondary likely to postoperative change rather than osteomyelitis. The patient has arranged his own consultation with Dr. Andree Elk at [facility], apparently their daughter lives in [place] and has some connection here. Any improvement in vascular supply by Dr. Andree Elk would of course be helpful. Dr. Bridgett Larsson did not feel that anything further could be done other than amputation if wound care did not result in healing or if the area deteriorates. 06/26/16; the patient has been to see Dr. Andree Elk at [facility] and had an angiogram. He is going for a procedure on Thursday which will involve catheterization. I'm not sure if this is an anterograde or retrograde approach. He has been using Santyl to the wound 07/10/16; the patient had a repeat angiogram and angioplasty at [place] by Dr. Brunetta Jeans. His angiogram showed right CFA and profundal widely patent. The right as of a.m. popliteal artery were widely patent the right anterior tibial was occluded proximally and reconstitutes at the ankle. Peroneal artery was patent to the foot. Posterior tibial artery was occluded. The patient had angioplasty of the anterior tibial artery.. This was quite successful. He was recommended for Plavix as well as aspirin. 07/17/16; the patient was close to be a nurse visit today however the outer dressing of the Apligraf fell off. Noted drainage. I was asked to see the wound. The patient is noted an odor however his wife had noted that.  Drainage with Apligraf not necessarily a bad thing. He has not been systemically unwell 07/24/16; we are still have an issue with drainage of this wound. In spite of this I applied his second Apligraf. Medially the area still is probing to bone. 08/07/16; Apligraf reapplied in general wound looks improved. 08/21/16 Apligraf #4. Wound looks much better 09/04/16 patientt's wound again today continues to appear to improve with the application of the Apligraf's. He notes no increased discomfort or concerns at this point in time. 09/18/16; the patient returns today 2 weeks after his fifth application of Apligraf. Predictably three quarters of the width of this wound has healed. The deep area that probe to bone medially is still open. The patient asked how much out-of-pocket dollars would be for additional Apligraf's. 09/25/16; now using Hydrofera Blue. He has completed 5 Apligraf applications with considerable improvement in this deep open transmetatarsal amputation site. His wound is now a triangular-shaped wound on the medial aspect. At roughly 12 to 2:00 this probes another centimeter but as opposed to in the past this does not probe to bone. The patient has been seen at [place] by Dr. Zenia Resides. He is not planning to do any more revascularization unless the wound stalls or worsens per the patient 10/02/16; 0.7 x 0.8 x 0.8. Unfortunately although the wound looks stable to improved. There is now easily probable bone. This hasn't been present for several weeks. Patient is not otherwise symptomatic he is not experiencing any pain. I did a culture of the wound bed 10/09/16. Deterioration last week. Culture grew MRSA and although there is improvement here with doxycycline prescribed over the phone I'm going to try to get him linezolid 600 twice a day for 10 days today. 10/16/16; he is  completing a weeks worth of linezolid and still has 3 more days to go. Small triangular-shaped open area with some degree of  undermining. There is still palpable bone with a curet. Overall the area appears better than last week 10/20/16 he has completed the linezolid still having some nausea and vomiting but no diarrhea. He has exposed bone this week which is a deterioration. 10/27/16 patient now has a small but probing wound down to bone. Culture of this bone that I did last week showed a few methicillin-resistant staph aureus. I have little doubt that this represents acute/subacute osteomyelitis. The patient is currently on Doxy which I will continue he also completed 10 days of linezolid. We are now in a difficult situation with this patient's foot after considerable discussion we will send him back to see Dr. Doran Durand for a surgical opinion of this I'm also going to try to arrange a infectious disease consult at [facility] hopefully week and get this prior to her usual 4-6 weeks we having [place]. The patient clearly is going to need 6 weeks of IV vancomycin. If we cannot arrange this expediently I'll have to consider ordering this myself through a home infusion company 11/03/16; the patient now has a small in terms of circumference but probing wound. No bone palpable today. The patient remains on doxycycline 100 twice a day which should support him until he sees infectious disease at [facility] next week the following week on Wednesday I believe he has an appointment with Dr. Andree Elk at [facility] who is his vascular cardiologist. Finally he has an appointment with Dr. Doran Durand on 11/22/16 we have been using silver alginate. The patient's wife states they are having trouble getting this through [facility] 11/13/16; the patient was seen by infectious disease at [facility] in the 11th PICC line placed in preparation for IV antibiotics. A tummy he has not going to get IV vancomycin o Ceftaroline. They've also ordered an MRI. Patient has a follow-up with Dr. Doran Durand on 11/22/16 and Dr. Andree Elk at [facility] tomorrow 11/23/16 the patient is on  daptomycin as directed by infectious disease at [facility]. He is also been back to see Dr. Andree Elk at [facility]. He underwent a repeat arteriogram. He had a successful PTA of the right anterior tibial artery. He is on dual antiplatelete treatment with Plavix and aspirin. Finally he had the MRI of his foot in [place]. This showed cellulitis about the foot worse distally edema and enhancement in the reminiscent of the second metatarsal was consistent with osteomyelitis therefore what I was assuming to be the first metatarsal may be actually the second. He also has a fluid collection deep to the calcaneus at the level of the calcaneal spur which could be an abscess or due to adventitial bursitis. He had a small tear in his Achilles 11/30/16; the patient continues on daptomycin as directed by infectious disease at [facility]. He is been revascularized by Dr. Andree Elk at [facility] in [place]. He has been to see Gretta Arab who was the orthopedic surgeon who did his original amputation. I have not seen his not however per the patient's wife he did not offer another surgical local surgical prodecure to remove involved bone. He verbalized his usual disbelief in not just hyperbarics but any medical therapy for this condition(osteomyelitis). I discussed this in detail with the patient today including answering the question about a BKA definitively "curing" the current condition. 12/07/16; the patient continues on daptomycin as directed by infectious disease at [facility]. This is  directed at the MRSA that we cultured from his bone debridement from 12/22. Lab work today shows a white count of 8.7 hemoglobin of 10.5 which is microcytic and hypochromic differential count shows a slightly elevated monocyte count at 1.2 eosinophilic count of 0.6. His creatinine is 1.11 sedimentation rate apparently is gone from 35-34 now 40. I explained was wife I don't think this represents a trend. His total CK is 48 12/14/16- patient is  here for follow-up evaluation of his right TMA site. He continues to receive IV daptomycin per infectious disease. His serum inflammatory markers remain elevated. He complains of intermittent pain to the medial aspect of the TMA site with intermittent erythema. He voices no complaints or concerns regarding hyperbaric therapy. Overall he and his wife are expressing a frustration and discouragement regarrding the length of time of treatment. 12/21/16; small open wound at roughly the first or second metatarsal metatarsalphalyngeal joint reminiscence of his transmetatarsal amputation site he continues to receive IV daptomycin per infectious disease at [facility]. He will finish these a week tomorrow. He has lab work which I been copied on. His white count is 10.8 hemoglobin 8.9 MCV is low at 75., MCH low at 24.5 platelet count slightly elevated at 626. Differential count shows 70% neutrophils 10% monocytes and 10% eosinophils. His comprehensive metabolic panel shows a slightly low sodium at 133 albumin low at 3.1 total CK is normal at 42 sedimentation rate is much higher at 82. He has had iron studies that show a serum iron of 15 and iron binding capacity of 238 and iron saturation of 6. This is suggestive of iron deficiency. B12 and folate were normal ferritin at 113 The patient tells me that he is not eating well and he has lost weight. He feels episodically nauseated. He is coughing and gagging on mucus which she thinks is sinusitis. He has an appointment with his primary doctor at 5:00 this afternoon in [facility] 01/02/17; the patient developed a subacute pneumonitis. He was admitted to [facility] after a CT scan showed an extensive interstitial pneumonitis [I have not yet seen this]. 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 He has since been discharged. He is not on oxygen. He feels fatigued  and very short of breath with exertion. At [facility] the wound care nurse there felt that his wound was healed. He did complete his daptomycin and has follow-up with infectious disease on Friday. X-rays I did before he went to [facility] still suggested residual osteomyelitis in the anterior aspect of the second must metatarsal head. I'm not sure I would've expected any different. There was no other findings. I actually think I did this because of erythema over the first metatarsal head reminiscent 01/11/17; the patient has eosinophilic pneumonitis. He has been reviewed by pulmonology and given clearance for hyperbarics at least that's what his wife says. I'll need to see if there is note in care everywhere. Apparently the prognosis for improvement of daptomycin induced eosinophilic granulocyte is is 3 months without steroids. In the meantime infectious disease has placed him on doxycycline until the wound is closed. He is still is lost a lot of weight and his blood sugars are running in the mid 60s to low 80s fasting and at all times during the day 01/18/17; he has had adjustments in his insulin apparently his blood sugars in the morning or over 100. He wants to restart his hyperbaric treatment  we'll do this at 1:00. 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 I think there is good reason to complete his treatments in order to give him the best chance of maintaining a healed status and these DFU 3 wounds with MRSA infection in the bone 02/15/17; the patient was seen today in conjunction with HBO. He completed hyperbaric oxygen today. The open area on his transmetatarsal site has remained closed. There was an area of erythema when I saw him earlier in the week on the posterior heel although that is resolved as of today as well. He has been using a cam walker. This is a patient who came to [place] after a transmetatarsal  amputation that was necrotic and dehisced. He required revascularization percutaneously on 2 different occasions by Dr. Andree Elk of invasive cardiology at [facility]. 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 He went to [facility] infectious disease and had IV daptomycin for 5 weeks before developing eosinophilic pneumonitis and requiring an admission to hospital/ICU. He made a good recovery and is continued on doxycycline since. As mentioned his foot is closed now. He has a follow-up with Dr. Andree Elk tomorrow. He is going to [employer] on Monday for a custom-made shoe READMISSION Last visit Dr. Andree Elk [facility] 02/16/17 1. Critical limb ischemia of the RLE: s/p transmetatarsal amputation of the RLE, now completely healed. s/p right ATA percutaneous revascularization procedure x2, most recently 11/20/2016 s/p PTA of right AT 100% to less than 20% with a 2.5 x 200 balloon. He does have some residual osteomyelitis, but given the wound is closed, the orthopedist has recommended to follow. He has a fitting for a special shoe coming up next week. He has completed a course of daptomycin and doxycycline and he has been discharged by ID. He has completed a course of hyperbaric therapy. Continue Continue medical management with aspirin, Plavix and statin therapy. We will discuss ongoing Plavix therapy at follow-up in 6 months. 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 Given no symptoms, we will conservatively manage. He doesn't want to do any more invasive studies at this time, which is reasonable. The patient arrives today out of 2 concerns both on the right transmetatarsal site. 1 at the level of the reminiscent fifth metatarsal head and the other at roughly the first or second. Both of  these look like dark subcutaneous discoloration probably subdermal bleeding. He is recently obtained new adaptive footwear for the right foot. He also has a callus on the left fifth dorsal toe however he follows with podiatry for this and I don't think this is any issue. ABIs in this clinic today were 0.66 on the right and 1.06 on the left. On entrance into our clinic initially this was 0.95 and 0.92. He does not describe current claudication. They're going away on a cruise in 8 weeks and I think are trying to do the month is much as they can proactively. They follow with podiatry and have an appointment with Dr. Andree Elk in October READMISSION 06/25/18 This is a patient that we have not seen in almost a year. He is a type II diabetic with known PAD. He is followed by Dr. Andree Elk of interventional cardiology at [facility] in [place]. He is required revascularization for  significant PAD. When we first saw him he required a transmetatarsal amputation. He had underlying osteomyelitis with a nonhealing surgical wound. This eventually closed with wound care, IV antibiotics and hyperbaric oxygen. They tell me that he has a modified shoe and he is very active walking up to 4 miles a day. He is followed by Dr. Geroge Baseman of podiatry. His wife states that he underwent a removal of callus over this site on July 9. She felt there may be drainage from this site after that although she could never really determined and there was callus buildup again. On 06/01/18 there was pressure bleeding through the overlying callus. he was given a prescription for 7 days of Bactrim. Fortuitously he has an appointment with Dr. Andree Elk on 06/04/18 and he immediately underwent revascularization of the right leg although I have not had a chance to review these records in care everywhere. This was apparently done through anterior and retrograde access. On 06/06/18 he had another debridement by Dr. Bernette Mayers. Vitamin soaking with Epsom salts for  20 minutes and applying calcium alginate. The original trans-met was on April 2017 I believe by Dr. Doran Durand. His ABI in our clinic was noncompressible today. 07/02/18; x-ray I ordered last week was negative for osteomyelitis. Swab culture was also negative. He is going to require an MRI which I have ordered today. 07/09/18; surprisingly the MRI of the foot that I ordered did not show osteomyelitis. He did suggest the possibility of cellulitis. For this reason I'll go ahead and give him a 10 day course of doxycycline. Although the previous culture of this area was negative 07/16/18; it arrives with the wound looking much the same. Roughly the same depth. He has thick subcutaneous tissue around the wound orifice but this still has roughly the same depth. Been using silver alginate. We applied Oasis #1 today 07/23/2018; still having to remove a lot of callus and thick subcutaneous tissue to actually define the wound here. Most of this seems to have closed down yet he has a comma shaped divot over the top of the area that still I think is open. We applied Oasis #2 His wife expressed concern about the tip of his left great toe. This almost looks like a small blister. She also showed it today to her podiatrist Dr. Geroge Baseman who did not think this was anything serious. I am not sure is anything serious either however I think it bears some watching. He is not in any pain however he is insensate 07/30/2018;; still on a lot of nonviable tissue over the surface of the wound however cleaning this up reveals a more substantial wound orifice but less of a probing wound depth. This is not probed to bone. There is no evidence of infection The wife is still concerned about a non-open area on the tip of his left great toe. Almost feels like a bony outgrowth. She had previously showed this to podiatry. I do not think this is a blister. A friction area would be possible although he is really not walking according to his  wife. He had a small skin tag on his right buttock but no open wound here either 08/06/2018; we applied a third Oasis last week. Unfortunately there is really no improvement. Still requiring extensive debridement to expose the wound bed from a horizontal slitlike depression. I still have not been able to get this to fill in properly. On the positive side there is now no probable bone from when he first came into the  facility. I changed him to silver alginate today after a reasonably aggressive debridement 08/13/2018; once again the patient comes in with skin and subcutaneous tissue closing over the small probing area with the underlying cavity of the wound on the right TMA site. I applied silver alginate to this last week. Prior to that we used Oasis x3 still not able to get this area granulating. He does not have a probing area of the bone which is an improvement from when I spur started working on this and an MRI did not suggest osteomyelitis. I do not see evidence of infection here but I am increasingly concerned about why I cannot get this area to granulate. Each time I debrided this is looks like this is simply a matter of getting granulation to fill in the hole and then getting epithelialization. This does not seem to happen Also I sent him back to see podiatry Dr. Earleen Newport about the what felt to be bony outgrowth on the tip of his left great toe. Apparently after heel he left the clinic last week or the next day he developed a blood blister. He did see Dr. Earleen Newport. He went on to have a debridement of the medial nail cuticle he now has an open area here as well as some denuded skin. They did an x-ray apparently does have a bony outgrowth or spur but I am not able to look at this. They are using topical antibiotics apparently there was some suggested he use Santyl. Patient's wife was anxious for my opinion of this 08/20/2018; we are able to keep the wound open this time instead of the thick  subcutaneous tissue closing over the top of it however unfortunately once again this probes to bone. I do not see any evidence of infection and previous MRI did not show osteomyelitis. I elected to go back to the Oasis to see if we can stimulate some granulation. Last saw his vascular interventional cardiologist Dr. Andree Elk at the beginning of August and he had a repeat procedure. Nevertheless I wonder how much blood flow he has down to this area With regards to the left first toe he is seeing Dr. Earleen Newport next week. They are applying Bactroban to this area. 08/27/2018 Once again he comes in with thick eschar and subcutaneous tissue over the top of the small probing hole. This does not appear to go down to bone but it still has roughly the same depth. I reapplied Oasis today Over the left great toe there appears to be more of the wound at the tip of his toe than there was last week. This has an eschar on the surface of it. Will change to Santyl. There is seeing podiatry this afternoon 09/03/2018 He comes in today with the area on the transmetatarsal's site with a fair amount of callus, nonviable tissue over the circumference but it was not closed. I removed all of this as well as some subcutaneous debris and reapplied Oasis. There is no exposed bone The area over the tip of the left great toe started off as a nodule of uncertain etiology. He has been followed with podiatry. They have been removing part of the medial nail bed. He has nonviable tissue over the wound he been using Santyl in this area 09/10/18 Unfortunately comes in with neither wound area looking improved. The transmetatarsal amputation site once Again has nonviable debris over the surface requiring debridement. Unfortunately underneath this there is nothing that looks viable and this once again goes right down  to bone. There is no purulent drainage and no erythema. Also the surgical wound from podiatry on the left first toe has an  ischemic-looking eschar over the surface of the tip of the toe I have elected not to attempt candidly debride this His wife as arranged for him to have follow-up noninvasive studies in [place] under the care of Dr. Andree Elk clinic. The question is he has known severe PAD. They had recently seen him in August. He has had revascularizations in both legs within the last 4 or 5 months. He is not complaining of pain and I cannot really get a history of claudication. He has not systemically unwell 09/20/2018 the patient has been to [facility] and been revascularized by Dr. Andree Elk earlier this week. Apparently he was able to open up the anterior tibial artery although I have not actually seen his formal report. He is going for an attempt to revascularize on the left on Monday. He is apparently working with an investigational stent for lower extremity arteries below the knee and he has talked to the patient about placing that on Monday if possible. The patient has been using silver alginate on the transmetatarsal amputation site and Santyl on the left 09/30/2018; patient had his revascularization on the left this apparently included a standard approach as well as a more distal arterial catheterization although I do not have any information on this from Dr. Andree Elk. In fact I do not even see the initial revascularization that he had on the right. I have included the arterial history from Dr. Andree Elk last note however below; ASSESSMENT/PLAN: 1. Hx of Critical limb ischemia bilateral lower extremities, PAD: -s/p transmetatarsal amputation of the RLE. -s/p right ATA percutaneous revascularization procedure x2, most recently 11/20/2016 s/p PTA of right AT 100% to less than 20% with a 2.5 x 200 balloon. 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 -03/21/2018 s/p PTA of 90% left popliteal  artery to <10% with a 5x20 cutting balloon, PTA of 90% left peroneal to <20% with a 3x20 balloon, PTA of 100% left AT to <20% with a 2.5x220 balloon. 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 Will plan on the LLE soon after. Risks/benefits of procedure have been discussed and patient has elected to proceed. We have been using endoform to the right TMA amputation site wound and Santyl to the left great toe 10/07/2018; the area on the tip of his left great toe looked better we have been using Santyl here. We continue to have a very difficult probing hole on the right TMA amputation site we have been using endoform. I went on to use his fifth Oasis today 10/14/2018; the tip of the left great toe continues to look better. We have been using Santyl here the surface however is healthy and I think we can change to an alginate. The right TMA has not changed. Once again he has no superficial opening there is callus and thick subcutaneous tissue over the orifice once you remove this there is the probing area that we have been dealing with without too much change. There is no palpable bone I have been placing Oasis here and put Oasis #6 in this today after a more vigorous debridement 10/21/18; the left great toe  still has necrotic surface requiring debridement. The right TMA site hasn't changed in view of the thick callus over the wound bed. With removal of this there is still the opening however this does not appear to have the same depth. Again there is no palpable bone. Oasis was replaced 10/28/2018; patient comes in with both wounds looking worse. The area over the first toe tip is now down to bone. The area over the TMA site is deeper and down to bone clearly with a increase in overall wound area. Equally concerning on the right TMA is the complete absence  of a pulse this week which is a change. We are not even able to Doppler this. On the right he has a noncompressible ABI greater than 1.4 11/04/2018. Both wounds look somewhat worse. X-rays showed no osteomyelitis of the right foot but on the left there was underlying osteomyelitis in the left great toe distal phalanx. I been on the phone to Dr. Andree Elk surface at [facility] in [place] and they are arranging for another angiogram on the right on Monday. I have him on doxycycline for the osteomyelitis in the left great toe for now. Infectious disease may be necessary 1/17; 2-week hiatus. Patient was admitted to hospital at [place]. My understanding is he underwent an angioplasty of the right anterior tibial artery and had stents placed in the left anterior artery and the left tibial peroneal trunk. This was done by Dr. Andree Elk of interventional radiology. There is no major change in either 1 of the wounds. They have been using Aquacel Ag. As far as they are aware no imaging studies were done of the foot which is indeed unfortunate. I had him on doxycycline for 2 weeks since we identified the osteomyelitis in the left great toe by plain x-ray. I have renewed that again today. I am still suspicious about osteomyelitis in the amputation site and would consider doing another MRI to compare with the one done in September. The idea of hyperbaric oxygen certainly comes up for discussion 1/24; no major change in either wound area. I have him on doxycycline for osteomyelitis at the tip of the left great toe. As noted he has been previously and recently revascularized by Dr. Andree Elk at [place]. He is tolerating the doxycycline well. For some reason we do not have an infectious disease consult yet. Culture of drainage from the right foot site last week was negative 1/31; MRI of the right foot did not show osteomyelitis of the right ankle and foot. Notable for a skin ulceration overlying the second metatarsal stump with  generalizing soft tissue edema of the ankle and foot consistent with cellulitis. Noted to have a partial-thickness tear of the Achilles tendon 7.5 cm proximal to the insertion Nothing really new in terms of symptoms. Patient's area on the tip of the left great toe is just about closed although he still has the probing area on the metatarsal amputation site. Appointment with Dr. Linus Salmons of infectious disease next week 2/7; Dr. Novella Olive did not feel that any further antibiotics were necessary he would follow-up in 2 months. The left great toe appears to be closed still some surface callus that I gently looked under high did not see anything open or anything that was threatening to be open. He still has the open area on the mid part of his TMA site using endoform 2/14; left great toe is closed and he is completing his doxycycline as of last Sunday. He is not on any antibiotics.  Unfortunately out of the right foot his wife noticed some subdermal hemorrhage this week. He had been walking 2 miles I had given him permission to do so this is not really a plantar wound. Using endoform to this wound but I changed to silver alginate this week 2/21; left great toe remains closed. Culture last week grew Streptococcus angiosis which I am not really familiar with however it is penicillin sensitive and I am going to put him on Augmentin. Not much change in the wound on the right foot the deep area is still probing precariously close to bone and the wound on the margin of the TMA is larger. 2/28; left great toe remains closed. He is completing the Augmentin I gave him last week. 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 This is being shown to Dr. Andree Elk at [place] to see if there is anything else that can be done here. He has been using silver alginate strips on the right 3/6; left great toe remains closed. He sees Dr. Andree Elk on Monday. The area on the plantar aspect of the right  foot has the same small orifice with thick callused tissue around this. However this time with removal of the callus tissue the wound is open all the way along the incision line to the end medially. We have been using silver alginate. 3/13; left toe remains closed although the area is callused. He sees Dr. Jacqualyn Posey of podiatry next week. The area on the plantar right foot looked a lot better this week. Culture I did of this was negative we use silver alginate. He is going next week for an attempt at revascularization by Dr. Andree Elk 3/23; left toe remains closed although the area is callused. He will see Dr. Jacqualyn Posey in follow-up. The area on the right plantar foot continues to look surprisingly better over the last 3 visits. We have been using silver alginate. The revascularization he was supposed to have by Dr. Andree Elk at [facility] in [place] has been canceled [facility] procedure] 4/6; the right foot remains closed albeit callused. Podiatry canceled the appointment with regards to the left great toe. He has not seen Dr. Andree Elk at [facility] but thinks that Dr. Andree Elk has "done all he can do". He would be a candidate for the hemostaemix trial Readmission 07/29/2019 Mr. Stann Mainland is a man we know well from at least 3 previous stays in this clinic. He is a type II diabetic with severe PAD followed by Dr. Andree Elk at [facility] in [place]. 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 So far everything in both of these areas has remained closed. About 2 weeks ago he went to see his podiatrist [facility]. Babs Bertin. He had a thick callus on the left fifth metatarsal head that was shaved. He has developed an open wound in this area. They have been offloading this in his diabetic shoes. The patient has not had any more revascularizations by Dr. Andree Elk since the last time he was here.  ABI in our clinic at the posterior tibial on the left was 1.06 10/6; no real change in the area on the plantar met head. Small wound with 2 mm of depth. He has thick skin probably from pressure around the wound. We have been using silver alginate 10/13; small wound in the left  fifth plantar met head. Arrives today with undermining laterally and purulent drainage. Our intake nurse cultured this. His wife stated they noticed a change in color over the last day or 2. He is not systemically unwell 10/19; small wound on the fifth plantar metatarsal head. Culture I did last week showed Staphylococcus lugdunensis. Although this could be a skin contaminant the possibility of a skin and soft tissue infection was there. I did give him empiric doxycycline which should have covered this. We are using silver alginate to the wound. We put him in a total contact cast today. 10/22; small wound on the fifth plantar metatarsal head. He has completed antibiotics. He also has severe PAD which worries me about just about any wound on this man's foot 10/29; small superficial area on the fifth plantar metatarsal head. Measuring slightly smaller. He is not currently on any antibiotics. I been using a total contact cast in this man with very severe PAD but he seems to be tolerating this well 11/5; left plantar fifth metatarsal head. Silver alginate being used under a total contact cast 11/12; wound not much different than last week. Using a #15 scalpel debridement around the wound. Still silver collagen under a total contact cast. He has severe PAD and that may be playing a role in this 11/19; disappointing that the wound is not really changed that much. I think it is come down in overall surface area because originally this was on the lateral part of the fifth metatarsal head however recently it is not really changed. Most of this is filled in but it will not epithelialized there is surface debris on this which may be  mostly related to ischemia. They have an appointment with Dr. Andree Elk on 12/9 09/24/2019 on evaluation today patient appears to be doing somewhat better with regard to the wound on the left fifth metatarsal head. Fortunately there does not appear to be any signs of active infection at this time. No fevers, chills, nausea, vomiting, or diarrhea. The wound does not appear to be completely closed and I do feel like the Hydrofera Blue is helping to some degree although I feel like the cast as well as keeping things from breaking down or getting any larger at least. As far as his wound overview it shows that the overall size of the wound is slightly smaller but really maintaining within the realm of about the same. He had no troubles with the cast which is good news. 12/1; left fifth metatarsal head. Perhaps somewhat more vibrant. We have been using Hydrofera Blue. He sees Dr. Andree Elk at [place] 1 week tomorrow. He be back next week and I hope to have a better idea which way the wound is going however I will not cast him next week in case Dr. Andree Elk wants to reevaluate things in his foot. The left leg is the leg with the stent in place and I wonder whether these are going to have to be reevaluated. 12/8; left fifth metatarsal head. Quite a bit better this week. Only a small open area remains. He sees Dr. Andree Elk at [place] tomorrow. Dr. Andree Elk is previously done his vascular interventions. He has 2 stents in the left leg as I remember things. I therefore will not put him in a total contact cast today. He will be using a forefoot off loader. We will use silver collagen on the wound. We will see him again next week 12/15; left fifth metatarsal head. He saw Dr. Andree Elk and is scheduled  for an angiogram on Thursday. Unfortunately although his wound was a lot better last week it is really deteriorated this week. Small punched-out hole with depth and overhanging tissue. We have been using Hydrofera Blue 12/22; patient had an  80% stenosis proximal to the stent I believe in the below-knee popliteal artery. This was opened by Dr. Andree Elk. According the patient the stent itself was satisfactory. I have not been able to review this note. 12/29; patient arrives today with the wound about the same size some undermining medially. We have been using Hydrofera Blue under a total contact cast and that is what we will do again today 11/04/2019. Slightly smaller in orifice but about 4 mm undermining almost circumferentially. We have been using Hydrofera Blue. Apparently the Hydrofera Blue did not stay on the wound after we removed the cast. Some minor looking cast irritation on the right lateral 1/12; unfortunately things did not go well this week. Arrives in clinic today with a deeper wound with undermining more concerning a area of pus. Specimen was obtained for culture. We are not going to be able to put him in a cast today. Electronic Signature(s) Signed: 11/11/2019 6:16:53 PM By: [name] MD Entered By: [name] on 11/11/2019 15:24:33 -------------------------------------------------------------------------------- Physical Exam Details Patient Name: Date of Service: DEONE, LEIFHEIT 11/11/2019 1:30 PM Medical Record [id number] Patient Account Number: [id number] Date of Birth/Sex: Treating RN: 1946/09/02 (74 y.o. M) Primary Care Provider: [name] Other Clinician: Referring Provider: Treating Provider/Extender:Sendy Pluta, [name], [name] in Treatment: 15 Constitutional Sitting or standing Blood Pressure is within target range for patient.. Pulse regular and within target range for patient.Marland Kitchen Respirations regular, non-labored and within target range.. Temperature is normal and within the target range for the patient.Marland Kitchen Appears in no distress. Notes Wound exam; purulent drainage. This was obtained for culture. Using pickups and a #15 scalpel I removed overhanging skin and subcutaneous debris.  This is precariously close to ball. There is no surrounding soft tissue damage or overt infection Electronic Signature(s) Signed: 11/11/2019 6:16:53 PM By: [name] MD Entered By: [name] on 11/11/2019 15:25:34 -------------------------------------------------------------------------------- Physician Orders Details Patient Name: Date of Service: WASIL, WOLKE 11/11/2019 1:30 PM Medical Record [id number] Patient Account Number: [id number] Date of Birth/Sex: Treating RN: May 06, 1946 (73 y.o. [name]) [name] Primary Care Provider: [name] Other Clinician: Referring Provider: Treating Provider/Extender:Randal Goens, [name], [name] in Treatment: 15 Verbal / Phone Orders: No Diagnosis Coding ICD-10 Coding Code Description E11.621 Type 2 diabetes mellitus with foot ulcer E11.51 Type 2 diabetes mellitus with diabetic peripheral angiopathy without gangrene L97.521 Non-pressure chronic ulcer of other part of left foot limited to breakdown of skin L03.116 Cellulitis of left lower limb Follow-up Appointments Return Appointment in 1 week. Dressing Change Frequency Wound #5 Left Metatarsal head fifth Change dressing every day. Wound Cleansing Wound #5 Left Metatarsal head fifth May shower and wash wound with soap and water. Primary Wound Dressing Wound #5 Left Metatarsal head fifth Calcium Alginate with Silver Other: - pad right great toe for protection Secondary Dressing Kerlix/Rolled Gauze Dry Gauze Off-Loading Other: - surgical sandle Laboratory Bacteria identified in Unspecified specimen by Anaerobe culture (MICRO) - non healing wound with purlent drainage - (ICD10 E11.621 - Type 2 diabetes mellitus with foot ulcer) LOINC Code: 027-7 Convenience Name: Anerobic culture Electronic Signature(s) Signed: 11/11/2019 6:16:53 PM By: [name] MD Signed: 11/11/2019 6:18:05 PM By: [name] RN Entered By: [name] on 11/11/2019  15:23:43 -------------------------------------------------------------------------------- Problem List Details Patient Name: Date of Service:  DECKLAN, MAU 11/11/2019 1:30 PM Medical Record [id number] Patient Account Number: [id number] Date of Birth/Sex: Treating RN: 06-08-1946 (73 y.o. [name]) [name] Primary Care Provider: [name] Other Clinician: Referring Provider: Treating Provider/Extender:Rohn Fritsch, [name], [name] in Treatment: 15 Active Problems ICD-10 Evaluated Encounter Code Description Active Date Today Diagnosis E11.621 Type 2 diabetes mellitus with foot ulcer 07/29/2019 No Yes E11.51 Type 2 diabetes mellitus with diabetic peripheral 07/29/2019 No Yes angiopathy without gangrene L97.521 Non-pressure chronic ulcer of other part of left foot 07/29/2019 No Yes limited to breakdown of skin L03.116 Cellulitis of left lower limb 08/12/2019 No Yes Inactive Problems Resolved Problems Electronic Signature(s) Signed: 11/11/2019 6:16:53 PM By: [name] MD Entered By: [name] on 11/11/2019 15:21:10 -------------------------------------------------------------------------------- Progress Note Details Patient Name: Date of Service: HILLMAN, ATTIG 11/11/2019 1:30 PM Medical Record [id number] Patient Account Number: [id number] Date of Birth/Sex: Treating RN: 1946-04-07 (74 y.o. M) Primary Care Provider: [name] Other Clinician: Referring Provider: Treating Provider/Extender:Marjarie Irion, [name], [name] in Treatment: 15 Subjective History of Present Illness (HPI) 05/18/16; this is a 74year-old diabetic who is a type II diabetic on insulin. The history is that he traumatized his right foot developed a sore sometime in late March. Shortly thereafter he went on a cruise but he had to get off the cruise ship in [place] and fly urgently back to [place] where he was admitted to [facility] and ultimately underwent a  transmetatarsal amputation by Dr. 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 According to the patient and his wife this wound never really healed. He was seen on 2 occasions in the wound care center in [place] and had vascular studies and then was referred urgently to Dr. Bridgett Larsson of vascular surgery. He underwent an angiogram on 05/10/16. Unfortunately nothing really could be done to improve his vascular status. He had a 75-90% stenosis in the midsegment of 1 segment of the posterior femoral artery. He had a patent popliteal, his anterior tibial occluded shortly after takeoff. Perineal had a greater than 90% stenosis posterior tibial is occluded feet had no distal collaterals feed distal aspect of the transmetatarsal amputation site. The patient tells me that he had a prolonged period of Santyl by Dr. Doran Durand was some initial improvement but then this was stopped. I think they're only applying daily dressings/dry dressings. He has not had a recent x-ray of the right foot he did have one before his surgery in April. His wife by the dimensions of the wound/surgical site being followed at home since 4/20. At that point the dimensions were 0.5 x 12 x 0.2 on 7/19 this was 1.8 x 6 x 0.4. He is not currently on any antibiotics. His hemoglobin A1c in early April was 12.9 at that point he was started on insulin. Apparently his blood sugars are much lower he has an appointment with Dr. Legrand Como Alteimer of endocrine next week. 05/29/16 x-ray of the area did not show osteomyelitis. I think he probably needs an MRI at this point. His wife is asking about something called"Yireh" cream which is not FDA approved. I have not heard of this. 06/22/16; MRI did not really suggest osteomyelitis. There was minimal marrow edema and enhancement in the stump of the second metatarsal felt to be secondary likely to postoperative change rather than osteomyelitis. The patient has arranged his own consultation with Dr. Andree Elk at [place],  apparently their daughter lives in [place] and has some connection here. Any improvement in vascular supply by Dr. Andree Elk would of course  be helpful. Dr. Bridgett Larsson did not feel that anything further could be done other than amputation if wound care did not result in healing or if the area deteriorates. 06/26/16; the patient has been to see Dr. Andree Elk at [place] and had an angiogram. He is going for a procedure on Thursday which will involve catheterization. I'm not sure if this is an anterograde or retrograde approach. He has been using Santyl to the wound 07/10/16; the patient had a repeat angiogram and angioplasty at [place] by Dr. Brunetta Jeans. His angiogram showed right CFA and profundal widely patent. The right as of a.m. popliteal artery were widely patent the right anterior tibial was occluded proximally and reconstitutes at the ankle. Peroneal artery was patent to the foot. Posterior tibial artery was occluded. The patient had angioplasty of the anterior tibial artery.. This was quite successful. He was recommended for Plavix as well as aspirin. 07/17/16; the patient was close to be a nurse visit today however the outer dressing of the Apligraf fell off. Noted drainage. I was asked to see the wound. The patient is noted an odor however his wife had noted that. Drainage with Apligraf not necessarily a bad thing. He has not been systemically unwell 07/24/16; we are still have an issue with drainage of this wound. In spite of this I applied his second Apligraf. Medially the area still is probing to bone. 08/07/16; Apligraf reapplied in general wound looks improved. 08/21/16 Apligraf #4. Wound looks much better 09/04/16 patientt's wound again today continues to appear to improve with the application of the Apligraf's. He notes no increased discomfort or concerns at this point in time. 09/18/16; the patient returns today 2 weeks after his fifth application of Apligraf. Predictably three quarters of the width of  this wound has healed. The deep area that probe to bone medially is still open. The patient asked how much out-of-pocket dollars would be for additional Apligraf's. 09/25/16; now using Hydrofera Blue. He has completed 5 Apligraf applications with considerable improvement in this deep open transmetatarsal amputation site. His wound is now a triangular-shaped wound on the medial aspect. At roughly 12 to 2:00 this probes another centimeter but as opposed to in the past this does not probe to bone. The patient has been seen at [place] by Dr. Zenia Resides. He is not planning to do any more revascularization unless the wound stalls or worsens per the patient 10/02/16; 0.7 x 0.8 x 0.8. Unfortunately although the wound looks stable to improved. There is now easily probable bone. This hasn't been present for several weeks. Patient is not otherwise symptomatic he is not experiencing any pain. I did a culture of the wound bed 10/09/16. Deterioration last week. Culture grew MRSA and although there is improvement here with doxycycline prescribed over the phone I'm going to try to get him linezolid 600 twice a day for 10 days today. 10/16/16; he is completing a weeks worth of linezolid and still has 3 more days to go. Small triangular-shaped open area with some degree of undermining. There is still palpable bone with a curet. Overall the area appears better than last week 10/20/16 he has completed the linezolid still having some nausea and vomiting but no diarrhea. He has exposed bone this week which is a deterioration. 10/27/16 patient now has a small but probing wound down to bone. Culture of this bone that I did last week showed a few methicillin-resistant staph aureus. I have little doubt that this represents acute/subacute osteomyelitis. The patient is currently  on Doxy which I will continue he also completed 10 days of linezolid. We are now in a difficult situation with this patient's foot after considerable  discussion we will send him back to see Dr. Doran Durand for a surgical opinion of this I'm also going to try to arrange a infectious disease consult at [facility] hopefully week and get this prior to her usual 4-6 weeks we having [place]. The patient clearly is going to need 6 weeks of IV vancomycin. If we cannot arrange this expediently I'll have to consider ordering this myself through a home infusion company 11/03/16; the patient now has a small in terms of circumference but probing wound. No bone palpable today. The patient remains on doxycycline 100 twice a day which should support him until he sees infectious disease at [facility] next week the following week on Wednesday I believe he has an appointment with Dr. Andree Elk at [facility] who is his vascular cardiologist. Finally he has an appointment with Dr. Doran Durand on 11/22/16 we have been using silver alginate. The patient's wife states they are having trouble getting this through [facility] 11/13/16; the patient was seen by infectious disease at [facility] in the 11th PICC line placed in preparation for IV antibiotics. A tummy he has not going to get IV vancomycin o Ceftaroline. They've also ordered an MRI. Patient has a follow-up with Dr. Doran Durand on 11/22/16 and Dr. Andree Elk at [facility] tomorrow 11/23/16 the patient is on daptomycin as directed by infectious disease at [facility]. He is also been back to see Dr. Andree Elk at [facility]. He underwent a repeat arteriogram. He had a successful PTA of the right anterior tibial artery. He is on dual antiplatelete treatment with Plavix and aspirin. Finally he had the MRI of his foot in [place]. This showed cellulitis about the foot worse distally edema and enhancement in the reminiscent of the second metatarsal was consistent with osteomyelitis therefore what I was assuming to be the first metatarsal may be actually the second. He also has a fluid collection deep to the calcaneus at the level of the calcaneal spur  which could be an abscess or due to adventitial bursitis. He had a small tear in his Achilles 11/30/16; the patient continues on daptomycin as directed by infectious disease at [facility]. He is been revascularized by Dr. Andree Elk at [place] in [place]. He has been to see Gretta Arab who was the orthopedic surgeon who did his original amputation. I have not seen his not however per the patient's wife he did not offer another surgical local surgical prodecure to remove involved bone. He verbalized his usual disbelief in not just hyperbarics but any medical therapy for this condition(osteomyelitis). I discussed this in detail with the patient today including answering the question about a BKA definitively "curing" the current condition. 12/07/16; the patient continues on daptomycin as directed by infectious disease at [facility]. This is directed at the MRSA that we cultured from his bone debridement from 12/22. Lab work today shows a white count of 8.7 hemoglobin of 10.5 which is microcytic and hypochromic differential count shows a slightly elevated monocyte count at 1.2 eosinophilic count of 0.6. His creatinine is 1.11 sedimentation rate apparently is gone from 35-34 now 40. I explained was wife I don't think this represents a trend. His total CK is 48 12/14/16- patient is here for follow-up evaluation of his right TMA site. He continues to receive IV daptomycin per infectious disease. His serum inflammatory markers remain elevated. He complains of intermittent  pain to the medial aspect of the TMA site with intermittent erythema. He voices no complaints or concerns regarding hyperbaric therapy. Overall he and his wife are expressing a frustration and discouragement regarrding the length of time of treatment. 12/21/16; small open wound at roughly the first or second metatarsal metatarsalphalyngeal joint reminiscence of his transmetatarsal amputation site he continues to receive IV daptomycin per infectious  disease at [facility]. He will finish these a week tomorrow. He has lab work which I been copied on. His white count is 10.8 hemoglobin 8.9 MCV is low at 75., MCH low at 24.5 platelet count slightly elevated at 626. Differential count shows 70% neutrophils 10% monocytes and 10% eosinophils. His comprehensive metabolic panel shows a slightly low sodium at 133 albumin low at 3.1 total CK is normal at 42 sedimentation rate is much higher at 82. He has had iron studies that show a serum iron of 15 and iron binding capacity of 238 and iron saturation of 6. This is suggestive of iron deficiency. B12 and folate were normal ferritin at 113 The patient tells me that he is not eating well and he has lost weight. He feels episodically nauseated. He is coughing and gagging on mucus which she thinks is sinusitis. He has an appointment with his primary doctor at 5:00 this afternoon in [facility] 01/02/17; the patient developed a subacute pneumonitis. He was admitted to [facility] after a CT scan showed an extensive interstitial pneumonitis [I have not yet seen this]. 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 He has since been discharged. He is not on oxygen. He feels fatigued and very short of breath with exertion. At [facility] the wound care nurse there felt that his wound was healed. He did complete his daptomycin and has follow-up with infectious disease on Friday. X-rays I did before he went to [place] still suggested residual osteomyelitis in the anterior aspect of the second must metatarsal head. I'm not sure I would've expected any different. There was no other findings. I actually think I did this because of erythema over the first metatarsal head reminiscent 01/11/17; the patient has eosinophilic pneumonitis. He has been reviewed by pulmonology and given clearance for hyperbarics at least that's what  his wife says. I'll need to see if there is note in care everywhere. Apparently the prognosis for improvement of daptomycin induced eosinophilic granulocyte is is 3 months without steroids. In the meantime infectious disease has placed him on doxycycline until the wound is closed. He is still is lost a lot of weight and his blood sugars are running in the mid 60s to low 80s fasting and at all times during the day 01/18/17; he has had adjustments in his insulin apparently his blood sugars in the morning or over 100. He wants to restart his hyperbaric treatment we'll do this at 1:00. 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 I think there is good reason to complete his treatments in order to give him the best chance of maintaining a healed status and these DFU 3 wounds with MRSA infection in the bone 02/15/17; the patient was seen today in conjunction with HBO. He completed hyperbaric oxygen today. The open area on his transmetatarsal site has remained closed. There was an area of erythema when I saw him earlier in the week on the posterior heel although that is resolved  as of today as well. He has been using a cam walker. This is a patient who came to [place] after a transmetatarsal amputation that was necrotic and dehisced. He required revascularization percutaneously on 2 different occasions by Dr. Andree Elk of invasive cardiology at [facility]. 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 He went to [facility] infectious disease and had IV daptomycin for 5 weeks before developing eosinophilic pneumonitis and requiring an admission to hospital/ICU. He made a good recovery and is continued on doxycycline since. As mentioned his foot is closed now. He has a follow-up with Dr. Andree Elk tomorrow. He is going to [employer] on Monday for a  custom-made shoe READMISSION Last visit Dr. Andree Elk [facility] 02/16/17 1. Critical limb ischemia of the RLE:  s/p transmetatarsal amputation of the RLE, now completely healed.  s/p right ATA percutaneous revascularization procedure x2, most recently 11/20/2016 s/p PTA of right AT 100% to less than 20% with a 2.5 x 200 balloon.  He does have some residual osteomyelitis, but given the wound is closed, the orthopedist has recommended to follow. He has a fitting for a special shoe coming up next week. He has completed a course of daptomycin and doxycycline and he has been discharged by ID. He has completed a course of hyperbaric therapy.  Continue Continue medical management with aspirin, Plavix and statin therapy. We will discuss ongoing Plavix therapy at follow-up in 6 months.  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 Given no symptoms, we will conservatively manage. He doesn't want to do any more invasive studies at this time, which is reasonable. The patient arrives today out of 2 concerns both on the right transmetatarsal site. 1 at the level of the reminiscent fifth metatarsal head and the other at roughly the first or second. Both of these look like dark subcutaneous discoloration probably subdermal bleeding. He is recently obtained new adaptive footwear for the right foot. He also has a callus on the left fifth dorsal toe however he follows with podiatry for this and I don't think this is any issue. ABIs in this clinic today were 0.66 on the right and 1.06 on the left. On entrance into our clinic initially this was 0.95 and 0.92. He does not describe current claudication. They're going away on a cruise in 8 weeks and I think are trying to do the month is much as they can proactively. They follow with podiatry and have an appointment with Dr. Andree Elk  in October READMISSION 06/25/18 This is a patient that we have not seen in almost a year. He is a type II diabetic with known PAD. He is followed by Dr. Andree Elk of interventional cardiology at [facility] in [place]. He is required revascularization for significant PAD. When we first saw him he required a transmetatarsal amputation. He had underlying osteomyelitis with a nonhealing surgical wound. This eventually closed with wound care, IV antibiotics and hyperbaric oxygen. They tell me that he has a modified shoe and he is very active walking up to 4 miles a day. He is followed by Dr. Geroge Baseman of podiatry. His wife states that he underwent a removal of callus over this site on July 9. She felt there may be drainage from this site after that although she could never really determined and there was callus buildup  again. On 06/01/18 there was pressure bleeding through the overlying callus. he was given a prescription for 7 days of Bactrim. Fortuitously he has an appointment with Dr. Andree Elk on 06/04/18 and he immediately underwent revascularization of the right leg although I have not had a chance to review these records in care everywhere. This was apparently done through anterior and retrograde access. On 06/06/18 he had another debridement by Dr. Bernette Mayers. Vitamin soaking with Epsom salts for 20 minutes and applying calcium alginate. The original trans-met was on April 2017 I believe by Dr. Doran Durand. His ABI in our clinic was noncompressible today. 07/02/18; x-ray I ordered last week was negative for osteomyelitis. Swab culture was also negative. He is going to require an MRI which I have ordered today. 07/09/18; surprisingly the MRI of the foot that I ordered did not show osteomyelitis. He did suggest the possibility of cellulitis. For this reason I'll go ahead and give him a 10 day course of doxycycline. Although the previous culture of this area was negative 07/16/18; it arrives with the wound looking much the  same. Roughly the same depth. He has thick subcutaneous tissue around the wound orifice but this still has roughly the same depth. Been using silver alginate. We applied Oasis #1 today 07/23/2018; still having to remove a lot of callus and thick subcutaneous tissue to actually define the wound here. Most of this seems to have closed down yet he has a comma shaped divot over the top of the area that still I think is open. We applied Oasis #2 His wife expressed concern about the tip of his left great toe. This almost looks like a small blister. She also showed it today to her podiatrist Dr. Geroge Baseman who did not think this was anything serious. I am not sure is anything serious either however I think it bears some watching. He is not in any pain however he is insensate 07/30/2018;; still on a lot of nonviable tissue over the surface of the wound however cleaning this up reveals a more substantial wound orifice but less of a probing wound depth. This is not probed to bone. There is no evidence of infection The wife is still concerned about a non-open area on the tip of his left great toe. Almost feels like a bony outgrowth. She had previously showed this to podiatry. I do not think this is a blister. A friction area would be possible although he is really not walking according to his wife. He had a small skin tag on his right buttock but no open wound here either 08/06/2018; we applied a third Oasis last week. Unfortunately there is really no improvement. Still requiring extensive debridement to expose the wound bed from a horizontal slitlike depression. I still have not been able to get this to fill in properly. On the positive side there is now no probable bone from when he first came into the facility. I changed him to silver alginate today after a reasonably aggressive debridement 08/13/2018; once again the patient comes in with skin and subcutaneous tissue closing over the small probing area with  the underlying cavity of the wound on the right TMA site. I applied silver alginate to this last week. Prior to that we used Oasis x3 still not able to get this area granulating. He does not have a probing area of the bone which is an improvement from when I spur started working on this and an MRI did not suggest osteomyelitis. I do not see  evidence of infection here but I am increasingly concerned about why I cannot get this area to granulate. Each time I debrided this is looks like this is simply a matter of getting granulation to fill in the hole and then getting epithelialization. This does not seem to happen Also I sent him back to see podiatry Dr. Earleen Newport about the what felt to be bony outgrowth on the tip of his left great toe. Apparently after heel he left the clinic last week or the next day he developed a blood blister. He did see Dr. Earleen Newport. He went on to have a debridement of the medial nail cuticle he now has an open area here as well as some denuded skin. They did an x-ray apparently does have a bony outgrowth or spur but I am not able to look at this. They are using topical antibiotics apparently there was some suggested he use Santyl. Patient's wife was anxious for my opinion of this 08/20/2018; we are able to keep the wound open this time instead of the thick subcutaneous tissue closing over the top of it however unfortunately once again this probes to bone. I do not see any evidence of infection and previous MRI did not show osteomyelitis. I elected to go back to the Oasis to see if we can stimulate some granulation. Last saw his vascular interventional cardiologist Dr. Andree Elk at the beginning of August and he had a repeat procedure. Nevertheless I wonder how much blood flow he has down to this area With regards to the left first toe he is seeing Dr. Earleen Newport next week. They are applying Bactroban to this area. 08/27/2018 ooOnce again he comes in with thick eschar and subcutaneous  tissue over the top of the small probing hole. This does not appear to go down to bone but it still has roughly the same depth. I reapplied Oasis today ooOver the left great toe there appears to be more of the wound at the tip of his toe than there was last week. This has an eschar on the surface of it. Will change to Santyl. There is seeing podiatry this afternoon 09/03/2018 ooHe comes in today with the area on the transmetatarsal's site with a fair amount of callus, nonviable tissue over the circumference but it was not closed. I removed all of this as well as some subcutaneous debris and reapplied Oasis. There is no exposed bone ooThe area over the tip of the left great toe started off as a nodule of uncertain etiology. He has been followed with podiatry. They have been removing part of the medial nail bed. He has nonviable tissue over the wound he been using Santyl in this area 09/10/18 ooUnfortunately comes in with neither wound area looking improved. The transmetatarsal amputation site once Again has nonviable debris over the surface requiring debridement. Unfortunately underneath this there is nothing that looks viable and this once again goes right down to bone. There is no purulent drainage and no erythema. ooAlso the surgical wound from podiatry on the left first toe has an ischemic-looking eschar over the surface of the tip of the toe I have elected not to attempt candidly debride this ooHis wife as arranged for him to have follow-up noninvasive studies in [place] under the care of Dr. Andree Elk clinic. The question is he has known severe PAD. They had recently seen him in August. He has had revascularizations in both legs within the last 4 or 5 months. He is not complaining of pain and  I cannot really get a history of claudication. He has not systemically unwell 09/20/2018 the patient has been to [facility] and been revascularized by Dr. Andree Elk earlier this week. Apparently he was  able to open up the anterior tibial artery although I have not actually seen his formal report. He is going for an attempt to revascularize on the left on Monday. He is apparently working with an investigational stent for lower extremity arteries below the knee and he has talked to the patient about placing that on Monday if possible. The patient has been using silver alginate on the transmetatarsal amputation site and Santyl on the left 09/30/2018; patient had his revascularization on the left this apparently included a standard approach as well as a more distal arterial catheterization although I do not have any information on this from Dr. Andree Elk. In fact I do not even see the initial revascularization that he had on the right. I have included the arterial history from Dr. Andree Elk last note however below; ASSESSMENT/PLAN: 1. Hx of Critical limb ischemia bilateral lower extremities, PAD: -s/p transmetatarsal amputation of the RLE. -s/p right ATA percutaneous revascularization procedure x2, most recently 11/20/2016 s/p PTA of right AT 100% to less than 20% with a 2.5 x 200 balloon. 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 -03/21/2018 s/p PTA of 90% left popliteal artery to <10% with a 5x20 cutting balloon, PTA of 90% left peroneal to <20% with a 3x20 balloon, PTA of 100% left AT to <20% with a 2.5x220 balloon. 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 Will plan on the LLE soon after. Risks/benefits of procedure have been discussed and patient has elected to proceed. We have been using endoform to the right TMA amputation site wound and Santyl to the left great toe 10/07/2018; the area on the tip of his left great toe  looked better we have been using Santyl here. We continue to have a very difficult probing hole on the right TMA amputation site we have been using endoform. I went on to use his fifth Oasis today 10/14/2018; the tip of the left great toe continues to look better. We have been using Santyl here the surface however is healthy and I think we can change to an alginate. The right TMA has not changed. Once again he has no superficial opening there is callus and thick subcutaneous tissue over the orifice once you remove this there is the probing area that we have been dealing with without too much change. There is no palpable bone I have been placing Oasis here and put Oasis #6 in this today after a more vigorous debridement 10/21/18; the left great toe still has necrotic surface requiring debridement. The right TMA site hasn't changed in view of the thick callus over the wound bed. With removal of this there is still the opening however this does not appear to have the same depth. Again there is no palpable bone. Oasis was replaced 10/28/2018; patient comes in with both wounds looking worse. The area over the first toe tip is now down to bone. The area over the TMA site is deeper and down to bone clearly with a increase in overall wound area. Equally concerning on the right TMA is the  complete absence of a pulse this week which is a change. We are not even able to Doppler this. On the right he has a noncompressible ABI greater than 1.4 11/04/2018. Both wounds look somewhat worse. X-rays showed no osteomyelitis of the right foot but on the left there was underlying osteomyelitis in the left great toe distal phalanx. I been on the phone to Dr. Andree Elk surface at [facility] in [place] and they are arranging for another angiogram on the right on Monday. I have him on doxycycline for the osteomyelitis in the left great toe for now. Infectious disease may be necessary 1/17; 2-week hiatus. Patient was admitted  to hospital at [facility]. My understanding is he underwent an angioplasty of the right anterior tibial artery and had stents placed in the left anterior artery and the left tibial peroneal trunk. This was done by Dr. Andree Elk of interventional radiology. There is no major change in either 1 of the wounds. They have been using Aquacel Ag. As far as they are aware no imaging studies were done of the foot which is indeed unfortunate. I had him on doxycycline for 2 weeks since we identified the osteomyelitis in the left great toe by plain x-ray. I have renewed that again today. I am still suspicious about osteomyelitis in the amputation site and would consider doing another MRI to compare with the one done in September. The idea of hyperbaric oxygen certainly comes up for discussion 1/24; no major change in either wound area. I have him on doxycycline for osteomyelitis at the tip of the left great toe. As noted he has been previously and recently revascularized by Dr. Andree Elk at [place]. He is tolerating the doxycycline well. For some reason we do not have an infectious disease consult yet. Culture of drainage from the right foot site last week was negative 1/31; MRI of the right foot did not show osteomyelitis of the right ankle and foot. Notable for a skin ulceration overlying the second metatarsal stump with generalizing soft tissue edema of the ankle and foot consistent with cellulitis. Noted to have a partial-thickness tear of the Achilles tendon 7.5 cm proximal to the insertion Nothing really new in terms of symptoms. Patient's area on the tip of the left great toe is just about closed although he still has the probing area on the metatarsal amputation site. Appointment with Dr. Linus Salmons of infectious disease next week 2/7; Dr. Novella Olive did not feel that any further antibiotics were necessary he would follow-up in 2 months. The left great toe appears to be closed still some surface callus that I gently looked  under high did not see anything open or anything that was threatening to be open. He still has the open area on the mid part of his TMA site using endoform 2/14; left great toe is closed and he is completing his doxycycline as of last Sunday. He is not on any antibiotics. Unfortunately out of the right foot his wife noticed some subdermal hemorrhage this week. He had been walking 2 miles I had given him permission to do so this is not really a plantar wound. Using endoform to this wound but I changed to silver alginate this week 2/21; left great toe remains closed. Culture last week grew Streptococcus angiosis which I am not really familiar with however it is penicillin sensitive and I am going to put him on Augmentin. Not much change in the wound on the right foot the deep area is still probing precariously close  to bone and the wound on the margin of the TMA is larger. 2/28; left great toe remains closed. He is completing the Augmentin I gave him last week. 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 This is being shown to Dr. Andree Elk at [place] to see if there is anything else that can be done here. He has been using silver alginate strips on the right 3/6; left great toe remains closed. He sees Dr. Andree Elk on Monday. The area on the plantar aspect of the right foot has the same small orifice with thick callused tissue around this. However this time with removal of the callus tissue the wound is open all the way along the incision line to the end medially. We have been using silver alginate. 3/13; left toe remains closed although the area is callused. He sees Dr. Jacqualyn Posey of podiatry next week. The area on the plantar right foot looked a lot better this week. Culture I did of this was negative we use silver alginate. He is going next week for an attempt at revascularization by Dr. Andree Elk 3/23; left toe remains closed although the area is callused. He will see Dr.  Jacqualyn Posey in follow-up. The area on the right plantar foot continues to look surprisingly better over the last 3 visits. We have been using silver alginate. The revascularization he was supposed to have by Dr. Andree Elk at [facility] in [place] has been canceled [facility] procedure] 4/6; the right foot remains closed albeit callused. Podiatry canceled the appointment with regards to the left great toe. He has not seen Dr. Andree Elk at [facility] but thinks that Dr. Andree Elk has "done all he can do". He would be a candidate for the hemostaemix trial Readmission 07/29/2019 Mr. Stann Mainland is a man we know well from at least 3 previous stays in this clinic. He is a type II diabetic with severe PAD followed by Dr. Andree Elk at [facility] in [place]. 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 So far everything in both of these areas has remained closed. About 2 weeks ago he went to see his podiatrist at [facility] Dr. Babs Bertin. He had a thick callus on the left fifth metatarsal head that was shaved. He has developed an open wound in this area. They have been offloading this in his diabetic shoes. The patient has not had any more revascularizations by Dr. Andree Elk since the last time he was here. ABI in our clinic at the posterior tibial on the left was 1.06 10/6; no real change in the area on the plantar met head. Small wound with 2 mm of depth. He has thick skin probably from pressure around the wound. We have been using silver alginate 10/13; small wound in the left fifth plantar met head. Arrives today with undermining laterally and purulent drainage. Our intake nurse cultured this. His wife stated they noticed a change in color over the last day or 2. He is not systemically unwell 10/19; small wound on the fifth plantar metatarsal head. Culture I did last week showed  Staphylococcus lugdunensis. Although this could be a skin contaminant the possibility of a skin and soft tissue infection was there. I did give him empiric doxycycline which should have covered this. We are using silver alginate to the wound. We put him in a total contact cast  today. 10/22; small wound on the fifth plantar metatarsal head. He has completed antibiotics. He also has severe PAD which worries me about just about any wound on this man's foot 10/29; small superficial area on the fifth plantar metatarsal head. Measuring slightly smaller. He is not currently on any antibiotics. I been using a total contact cast in this man with very severe PAD but he seems to be tolerating this well 11/5; left plantar fifth metatarsal head. Silver alginate being used under a total contact cast 11/12; wound not much different than last week. Using a #15 scalpel debridement around the wound. Still silver collagen under a total contact cast. He has severe PAD and that may be playing a role in this 11/19; disappointing that the wound is not really changed that much. I think it is come down in overall surface area because originally this was on the lateral part of the fifth metatarsal head however recently it is not really changed. Most of this is filled in but it will not epithelialized there is surface debris on this which may be mostly related to ischemia. They have an appointment with Dr. Andree Elk on 12/9 09/24/2019 on evaluation today patient appears to be doing somewhat better with regard to the wound on the left fifth metatarsal head. Fortunately there does not appear to be any signs of active infection at this time. No fevers, chills, nausea, vomiting, or diarrhea. The wound does not appear to be completely closed and I do feel like the Hydrofera Blue is helping to some degree although I feel like the cast as well as keeping things from breaking down or getting any larger at least. As far as his wound  overview it shows that the overall size of the wound is slightly smaller but really maintaining within the realm of about the same. He had no troubles with the cast which is good news. 12/1; left fifth metatarsal head. Perhaps somewhat more vibrant. We have been using Hydrofera Blue. He sees Dr. Andree Elk at [facility] tomorrow. He be back next week and I hope to have a better idea which way the wound is going however I will not cast him next week in case Dr. Andree Elk wants to reevaluate things in his foot. The left leg is the leg with the stent in place and I wonder whether these are going to have to be reevaluated. 12/8; left fifth metatarsal head. Quite a bit better this week. Only a small open area remains. He sees Dr. Andree Elk at [place] tomorrow. Dr. Andree Elk is previously done his vascular interventions. He has 2 stents in the left leg as I remember things. I therefore will not put him in a total contact cast today. He will be using a forefoot off loader. We will use silver collagen on the wound. We will see him again next week 12/15; left fifth metatarsal head. He saw Dr. Andree Elk and is scheduled for an angiogram on Thursday. Unfortunately although his wound was a lot better last week it is really deteriorated this week. Small punched-out hole with depth and overhanging tissue. We have been using Hydrofera Blue 12/22; patient had an 80% stenosis proximal to the stent I believe in the below-knee popliteal artery. This was opened by Dr. Andree Elk. According the patient the stent itself was satisfactory. I have not been able to review this note. 12/29; patient arrives today with the wound about the same size some undermining medially. We have been using Hydrofera Blue under a total contact  cast and that is what we will do again today 11/04/2019. Slightly smaller in orifice but about 4 mm undermining almost circumferentially. We have been using Hydrofera Blue. Apparently the Hydrofera Blue did not stay on the wound  after we removed the cast. Some minor looking cast irritation on the right lateral 1/12; unfortunately things did not go well this week. Arrives in clinic today with a deeper wound with undermining more concerning a area of pus. Specimen was obtained for culture. We are not going to be able to put him in a cast today. Objective Constitutional Sitting or standing Blood Pressure is within target range for patient.. Pulse regular and within target range for patient.Marland Kitchen Respirations regular, non-labored and within target range.. Temperature is normal and within the target range for the patient.Marland Kitchen Appears in no distress. Vitals Time Taken: 2:50 PM, Height: 69 in, Source: Stated, Weight: 210 lbs, Source: Stated, BMI: 31, Temperature: 98.1 F, Pulse: 73 bpm, Respiratory Rate: 18 breaths/min, Blood Pressure: 132/78 mmHg, Capillary Blood Glucose: 140 mg/dl. General Notes: glucose per pt report this am General Notes: Wound exam; purulent drainage. This was obtained for culture. Using pickups and a #15 scalpel I removed overhanging skin and subcutaneous debris. This is precariously close to ball. There is no surrounding soft tissue damage or overt infection Integumentary (Hair, Skin) Wound #5 status is Open. Original cause of wound was Gradually Appeared. The wound is located on the Left Metatarsal head fifth. The wound measures 0.5cm length x 0.5cm width x 0.4cm depth; 0.196cm^2 area and 0.079cm^3 volume. There is Fat Layer (Subcutaneous Tissue) Exposed exposed. There is no tunneling noted, however, there is undermining starting at 12:00 and ending at 12:00 with a maximum distance of 0.5cm. There is a small amount of purulent drainage noted. The wound margin is well defined and not attached to the wound base. There is large (67-100%) red granulation within the wound bed. There is no necrotic tissue within the wound bed. Assessment Active Problems ICD-10 Type 2 diabetes mellitus with foot ulcer Type 2  diabetes mellitus with diabetic peripheral angiopathy without gangrene Non-pressure chronic ulcer of other part of left foot limited to breakdown of skin Cellulitis of left lower limb Procedures Wound #5 Pre-procedure diagnosis of Wound #5 is a Diabetic Wound/Ulcer of the Lower Extremity located on the Left Metatarsal head fifth .Severity of Tissue Pre Debridement is: Fat layer exposed. There was a Excisional Skin/Subcutaneous Tissue Debridement with a total area of 0.25 sq cm performed by Ricard Dillon., MD. With the following instrument(s): Blade, and Forceps to remove Viable and Non-Viable tissue/material. Material removed includes Subcutaneous Tissue and Slough and after achieving pain control using Lidocaine 5% topical ointment. 1 specimen was taken by a Tissue Culture and sent to the lab per facility protocol. A time out was conducted at 15:15, prior to the start of the procedure. A Minimum amount of bleeding was controlled with Pressure. The procedure was tolerated well with a pain level of 0 throughout and a pain level of 0 following the procedure. Post Debridement Measurements: 0.5cm length x 0.5cm width x 0.4cm depth; 0.079cm^3 volume. Character of Wound/Ulcer Post Debridement is improved. Severity of Tissue Post Debridement is: Fat layer exposed. Post procedure Diagnosis Wound #5: Same as Pre-Procedure Plan Follow-up Appointments: Return Appointment in 1 week. Dressing Change Frequency: Wound #5 Left Metatarsal head fifth: Change dressing every day. Wound Cleansing: Wound #5 Left Metatarsal head fifth: May shower and wash wound with soap and water. Primary Wound Dressing: Wound #5  Left Metatarsal head fifth: Calcium Alginate with Silver Other: - pad right great toe for protection Secondary Dressing: Kerlix/Rolled Gauze Dry Gauze Off-Loading: Other: - surgical sandle Laboratory ordered were: Anerobic culture - non healing wound with purlent drainage 1. Change the  primary dressing to silver alginate change daily 2. No contact cast today. 3. Will need to offload this in the healing sandal while we await culture results 4. He was already on amoxicillin 500 3 times daily for oral issues by his oral surgeon, I have not change this pending culture results Electronic Signature(s) Signed: 11/11/2019 6:16:53 PM By: [name] MD Entered By: [name] on 11/11/2019 15:26:29 -------------------------------------------------------------------------------- SuperBill Details Patient Name: Date of Service: NICHALOS, BRENTON 11/11/2019 Medical Record [id number] Patient Account Number: [id number] Date of Birth/Sex: Treating RN: 10-20-1946 (74 y.o. M) Primary Care Provider: [name] Other Clinician: Referring Provider: Treating Provider/Extender:Eliya Bubar, [name], [name] in Treatment: 15 Diagnosis Coding ICD-10 Codes Code Description E11.621 Type 2 diabetes mellitus with foot ulcer E11.51 Type 2 diabetes mellitus with diabetic peripheral angiopathy without gangrene L97.521 Non-pressure chronic ulcer of other part of left foot limited to breakdown of skin L03.116 Cellulitis of left lower limb Facility Procedures CPT4 Code Description: [id number] 11042 - DEB SUBQ TISSUE 20 SQ CM/< ICD-10 Diagnosis Description L97.521 Non-pressure chronic ulcer of other part of left foot limited Modifier: to breakdown Quantity: 1 of skin Physician Procedures CPT4 Code Description: [id number] 36629 - WC PHYS SUBQ TISS 20 SQ CM ICD-10 Diagnosis Description L97.521 Non-pressure chronic ulcer of other part of left foot limited Modifier: to breakdown Quantity: 1 of skin Electronic Signature(s) Signed: 11/11/2019 6:16:53 PM By: [name] MD Entered By: [name] on 11/11/2019 15:26:46

## 2019-11-13 LAB — AEROBIC CULTURE W GRAM STAIN (SUPERFICIAL SPECIMEN)

## 2019-11-13 LAB — AEROBIC CULTURE? (SUPERFICIAL SPECIMEN)

## 2019-11-14 NOTE — Progress Notes (Addendum)
HUBERT, RAATZ ([id number]) Visit Report for 11/11/2019 Arrival Information Details Patient Name: Date of Service: [name], [name] 11/11/2019 1:30 PM Medical Record [id number] Patient Account Number: [id number] Date of Birth/Sex: Treating RN: 1946/10/21 (74 y.o. [name], [name] Primary Care Jahmad Petrich: [name] Other Clinician: Referring Martell Mcfadyen: Treating Riki Gehring/Extender:[name], [name], [name] in Treatment: 15 Visit Information History Since Last Visit Added or deleted any medications: No Patient Arrived: Ambulatory Any new allergies or adverse reactions: No Arrival Time: 14:49 Had a fall or experienced change in No Accompanied By: spouse activities of daily living that may affect Transfer Assistance: None risk of falls: Patient Identification Verified: Yes Signs or symptoms of abuse/neglect since No Secondary Verification Process Yes last visito Completed: Hospitalized since last visit: No Patient Requires Transmission-Based No Implantable device outside of the clinic No Precautions: excluding Patient Has Alerts: No cellular tissue based products placed in the center since last visit: Has Dressing in Place as Prescribed: Yes Has Footwear/Offloading in Place as Yes Prescribed: Left: Total Contact Cast Pain Present Now: No Electronic Signature(s) Signed: 11/11/2019 6:12:59 PM By: [name] RN, BSN Entered By: [name] on 11/11/2019 14:50:03 -------------------------------------------------------------------------------- Complex / Palliative Patient Assessment Details Patient Name: Date of Service: [name], [name] 11/11/2019 1:30 PM Medical Record [id number] Patient Account Number: [id number] Date of Birth/Sex: Treating RN: 1946-04-27 (73 y.o. [name] Primary Care Aric Jost: [name] Other Clinician: Referring Jahnasia Tatum: Treating Ghazal Pevey/Extender:[name], [name], [name] in Treatment: 15 Palliative  Management Criteria Complex Wound Management Criteria Patient has remarkable or complex co-morbidities requiring medications or treatments that extend wound healing times. Examples: Diabetes mellitus with chronic renal failure or end stage renal disease requiring dialysis Advanced or poorly controlled rheumatoid arthritis Diabetes mellitus and end stage chronic obstructive pulmonary disease Active cancer with current chemo- or radiation therapy Type 2 Diabetes, PAD Care Approach Wound Care Plan: Complex Wound Management Electronic Signature(s) Signed: 11/20/2019 5:54:58 PM By: Baltazar Najjar MD Signed: 12/25/2019 2:24:29 PM By: Zandra Abts RN, BSN Entered By: Zandra Abts on 11/18/2019 12:03:29 -------------------------------------------------------------------------------- Encounter Discharge Information Details Patient Name: Date of Service: [name], [name] 11/11/2019 1:30 PM Medical Record [id number] Patient Account Number: [id number] Date of Birth/Sex: Treating RN: [birth year] (74 y.o. [name] Primary Care Nik Gorrell: [name] Other Clinician: Referring Ivelise Castillo: Treating Infant Zink/Extender:[name], [name], [name] in Treatment: 15 Encounter Discharge Information Items Post Procedure Vitals Discharge Condition: Stable Temperature (F): 98.1 Ambulatory Status: Ambulatory Pulse (bpm): 73 Discharge Destination: Home Respiratory Rate (breaths/min): 18 Transportation: Private Auto Blood Pressure (mmHg): 132/78 Accompanied By: wife Schedule Follow-up Appointment: Yes Clinical Summary of Care: Patient Declined Electronic Signature(s) Signed: 11/14/2019 5:57:08 PM By: Cherylin Mylar Entered By: Cherylin Mylar on 11/11/2019 17:02:23 -------------------------------------------------------------------------------- Lower Extremity Assessment Details Patient Name: Date of Service: [name] 11/11/2019 1:30 PM Medical Record [id number]  Patient Account Number: [id number] Date of Birth/Sex: Treating RN: 02-13-1946 (73 y.o. [name] Primary Care Catlyn Shipton: Other Clinician: [name] Referring Verdia Bolt: Treating Littleton Haub/Extender:[name], [name], [name] in Treatment: 15 Edema Assessment Assessed: [Left: No] [Guzman: No] Edema: [Left: Ye] [Guzman: s] Calf Left: Guzman: Point of Measurement: cm From Medial Instep 40.3 cm cm Ankle Left: Guzman: Point of Measurement: cm From Medial Instep 29.8 cm cm Vascular Assessment Pulses: Dorsalis Pedis Palpable: [Left:No] Electronic Signature(s) Signed: 11/11/2019 6:12:59 PM By: [name] RN, BSN Entered By: [name] on 11/11/2019 15:03:32 -------------------------------------------------------------------------------- Multi Wound Chart Details Patient Name: Date of Service: [name], [name] 11/11/2019 1:30 PM Medical Record [id number] Patient Account Number: [id number] Date of Birth/Sex:  Treating RN: 1946-05-23 (74 y.o. 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] Comorbid History: [5:Cataracts, Chronic sinus problems/congestion, Coronary Artery Disease, Hypertension, Peripheral Arterial Disease, Type II Diabetes, Osteoarthritis, Neuropathy] [N/A:N/A N/A] Date Acquired: [5:07/14/2019] [N/A:N/A N/A] Weeks of Treatment: [5:15] [N/A:N/A N/A] Wound Status: [5:Open] [N/A:N/A N/A] Measurements L x W x D 0.5x0.5x0.4  [N/A:N/A N/A] (cm) Area (cm) : [5:0.196] [N/A:N/A N/A] Volume (cm) : [5:0.079] [N/A:N/A N/A] % Reduction in Area: [5:-211.10%] [N/A:N/A N/A] % Reduction in Volume: -507.70% [N/A:N/A N/A] Starting Position 1 [5:12] (o'clock): Ending Position 1 [5:12] (o'clock): Maximum Distance 1 [5:0.5] (cm): Undermining: [5:Yes] [N/A:N/A N/A] Classification: [5:Grade 1] [N/A:N/A N/A] Exudate Amount: [5:Small] [N/A:N/A N/A] Exudate Type: [5:Purulent] [N/A:N/A N/A] Exudate Color: [5:yellow, brown, green] [N/A:N/A N/A] Wound Margin: [5:Well defined, not attached] [N/A:N/A N/A] Granulation Amount: [5:Large (67-100%)] [N/A:N/A N/A] Granulation Quality: [5:Red] [N/A:N/A N/A] Necrotic Amount: [5:None Present (0%)] [N/A:N/A N/A] Exposed Structures: [5:Fat Layer (Subcutaneous Tissue) Exposed: Yes Fascia: No Tendon: No Muscle: No Joint: No Bone: No] [N/A:N/A N/A] Epithelialization: [5:None] [N/A:N/A N/A] Debridement: [5:Debridement - Excisional] [N/A:N/A N/A] Pre-procedure [5:15:15] [N/A:N/A N/A] Verification/Time Out Taken: Pain Control: [5:Lidocaine 5% topical ointment] [N/A:N/A N/A] Tissue Debrided: [5:Subcutaneous, Slough] [N/A:N/A N/A] Level: [5:Skin/Subcutaneous Tissue] [N/A:N/A N/A] Debridement Area (sq cm):0.25 [N/A:N/A N/A] Instrument: [5:Blade, Forceps] [N/A:N/A N/A] Bleeding: [5:Minimum] [N/A:N/A N/A] Hemostasis Achieved: [5:Pressure] [N/A:N/A N/A] Procedural Pain: [5:0] [N/A:N/A N/A] Post Procedural Pain: [5:0] [N/A:N/A N/A] Debridement Treatment Procedure was tolerated [N/A:N/A N/A] Response: [5:well] Post Debridement [5:0.5x0.5x0.4] [N/A:N/A N/A] Measurements L x W x D (cm) Post Debridement [5:0.079] [N/A:N/A N/A] Volume: (cm) Procedures Performed: Debridement [N/A:N/A N/A] Electronic Signature(s) Signed: 11/11/2019 6:16:53 PM By: [name] MD Entered By: [name] on 11/11/2019  15:21:18 -------------------------------------------------------------------------------- Multi-Disciplinary Care Plan Details Patient Name: Date of Service: [name], [name] 11/11/2019 1:30 PM Medical Record [id number] Patient Account Number: [id number] Date of Birth/Sex: Treating RN: [birth year] (73 y.o. [name] Primary Care Jadamarie Butson: [name] Other Clinician: Referring Yony Roulston: Treating Deaundre Allston/Extender:[name], [name], [name] in Treatment: 15 Active Inactive Wound/Skin Impairment Nursing Diagnoses: Knowledge deficit related to ulceration/compromised skin integrity Goals: Patient/caregiver will verbalize understanding of skin care regimen Date Initiated: 07/29/2019 Target Resolution Date: 12/05/2019 Goal Status: Active Ulcer/skin breakdown will have a volume reduction of 30% by week 4 Target Resolution Date Initiated: 07/29/2019 Date Inactivated: 10/21/2019 Date: 10/10/2019 Unmet Reason: comorbities. Goal Status: Unmet blood flow Ulcer/skin breakdown will have a volume reduction of 50% by week 8 Date Initiated: 10/21/2019 Target Resolution Date: 11/21/2019 Goal Status: Active Interventions: Assess patient/caregiver ability to obtain necessary supplies Assess patient/caregiver ability to perform ulcer/skin care regimen upon admission and as needed Assess ulceration(s) every visit Notes: Electronic Signature(s) Signed: 11/11/2019 6:18:05 PM By: [name] RN Entered By: [name] on 11/11/2019 14:29:11 -------------------------------------------------------------------------------- Pain Assessment Details Patient Name: Date of Service: [name], [name] 11/11/2019 1:30 PM Medical Record [id number] Patient Account Number: [id number] Date of Birth/Sex: Treating RN: 1946/08/16 (74 y.o. [name] Primary Care Monicka Cyran: [name] Other Clinician: Referring Agusta Hackenberg: Treating Sinjin Amero/Extender:[name], [name],  [name] in Treatment: 15 Active Problems Location of Pain Severity and Description of Pain Patient Has Paino No Site Locations Rate the pain. Current  Pain Level: 0 Pain Management and Medication Current Pain Management: Electronic Signature(s) Signed: 11/11/2019 6:12:59 PM By: [name] RN, BSN Entered By: [name] on 11/11/2019 15:02:26 -------------------------------------------------------------------------------- Patient/Caregiver Education Details Patient Name: Date of Service: TRYSTAN, AKHTAR 1/12/2021andnbsp1:30 PM Medical Record [id number] Patient Account Number: [id number] Date of Birth/Gender: 1946/10/10 (73 y.o. M) Treating RN: [name] Primary Care Physician: [name] Other Clinician: Referring Physician: Treating Physician/Extender:[name], [name], [name] in Treatment: 15 Education Assessment Education Provided To: Patient Education Topics Provided Wound/Skin Impairment: Methods: Explain/Verbal Responses: State content correctly Electronic Signature(s) Signed: 11/11/2019 6:18:05 PM By: [name] RN Entered By: [name] on 11/11/2019 14:29:29 -------------------------------------------------------------------------------- Wound Assessment Details Patient Name: Date of Service: [name], [name] 11/11/2019 1:30 PM Medical Record [id number] Patient Account Number: [id number] Date of Birth/Sex: Treating RN: [birth year] (74 y.o. [name] Primary Care Carvin Almas: [name] Other Clinician: Referring Brevan Luberto: Treating Hayato Guaman/Extender:[name], [name], [name] in Treatment: 15 Wound Status Wound Number: 5 Primary Diabetic Wound/Ulcer of the Lower Extremity Etiology: Wound Location: Left Metatarsal head fifth Wound Open Wounding Event: Gradually Appeared Status: Date Acquired: 07/14/2019 Comorbid Cataracts, Chronic sinus problems/congestion, Weeks Of Treatment: 15 History: Coronary  Artery Disease, Hypertension, Clustered Wound: No Peripheral Arterial Disease, Type II Diabetes, Osteoarthritis, Neuropathy Photos Wound Measurements Length: (cm) 0.5 % Reduct Width: (cm) 0.5 % Reduct Depth: (cm) 0.4 Epitheli Area: (cm) 0.196 Tunneli Volume: (cm) 0.079 Undermi Start Endin Maxim Wound Description Classification: Grade 1 Wound Margin: Well defined, not attached Exudate Amount: Medium Exudate Type: Purulent Exudate Color: yellow, brown, green Wound Bed Granulation Amount: Large (67-100%) Granulation Quality: Red Necrotic Amount: None Present (0%) Foul Odor After Cleansing: No Slough/Fibrino Yes Exposed Structure Fascia Exposed: No Fat Layer (Subcutaneous Tissue) Exposed: Yes Tendon Exposed: No Muscle Exposed: No Joint Exposed: No Bone Exposed: No ion in Area: -211.1% ion in Volume: -507.7% alization: None ng: No ning: Yes ing Position (o'clock): 12 g Position (o'clock): 12 um Distance: (cm) 0.5 Electronic Signature(s) Signed: 11/13/2019 3:33:56 PM By: Benjaman Kindler EMT/HBOT Signed: 11/13/2019 6:38:38 PM By: [name] RN, BSN Previous Signature: 11/11/2019 6:12:59 PM Version By: [name] RN, BSN Entered By: Benjaman Kindler on 11/13/2019 14:07:42 -------------------------------------------------------------------------------- Vitals Details Patient Name: Date of Service: [name], [name] 11/11/2019 1:30 PM Medical Record [id number] Patient Account Number: [id number] Date of Birth/Sex: Treating RN: 25-Nov-1945 (74 y.o. [name] Primary Care Ceyda Peterka: [name] Other Clinician: Referring Maxmilian Trostel: Treating Antionio Negron/Extender:[name], [name], [name] in Treatment: 15 Vital Signs Time Taken: 14:50 Temperature (F): 98.1 Height (in): 69 Pulse (bpm): 73 Source: Stated Respiratory Rate (breaths/min): 18 Weight (lbs): 210 Blood Pressure (mmHg): 132/78 Source: Stated Capillary Blood Glucose (mg/dl):  588 Body Mass Index (BMI): 31 Reference Range: 80 - 120 mg / dl Notes glucose per pt report this am Electronic Signature(s) Signed: 11/11/2019 6:12:59 PM By: [name] RN, BSN Entered By: [name] on 11/11/2019 14:50:48

## 2019-11-18 ENCOUNTER — Other Ambulatory Visit: Payer: Self-pay

## 2019-11-18 ENCOUNTER — Encounter (HOSPITAL_BASED_OUTPATIENT_CLINIC_OR_DEPARTMENT_OTHER): Payer: Medicare Other | Attending: Internal Medicine | Admitting: Internal Medicine

## 2019-11-18 DIAGNOSIS — E11621 Type 2 diabetes mellitus with foot ulcer: Secondary | ICD-10-CM | POA: Diagnosis not present

## 2019-11-18 NOTE — Progress Notes (Signed)
[name], [name] ([id number]) Visit Report for 11/18/2019 HPI Details Patient Name: Date of Service: [name], [name] 11/18/2019 12:30 PM Medical Record [id number] Patient Account Number: [id number] Date of Birth/Sex: Treating RN: 06-11-1946 (74 y.o. M) Primary Care Provider: [name] Other Clinician: Referring Provider: Treating Provider/Extender:Kale Dols, [name], [name] in Treatment: 16 History of Present Illness HPI Description: 05/18/16; this is a 74year-old diabetic who is a type II diabetic on insulin. The history is that he traumatized his right foot developed a sore sometime in late March. Shortly thereafter he went on a cruise but he had to get off the cruise ship in [place] and fly urgently back to [place] where he was admitted to [facility] and ultimately underwent a transmetatarsal amputation by Dr. 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 According to the patient and his wife this wound never really healed. He was seen on 2 occasions in the wound care center in [place] and had vascular studies and then was referred urgently to Dr. Bridgett Larsson of vascular surgery. He underwent an angiogram on 05/10/16. Unfortunately nothing really could be done to improve his vascular status. He had a 75-90% stenosis in the midsegment of 1 segment of the posterior femoral artery. He had a patent popliteal, his anterior tibial occluded shortly after takeoff. Perineal had a greater than 90% stenosis posterior tibial is occluded feet had no distal collaterals feed distal aspect of the transmetatarsal amputation site. The patient tells me that he had a prolonged period of Santyl by Dr. Doran Durand was some initial improvement but then this was stopped. I think they're only applying daily dressings/dry dressings. He has not had a recent x-ray of the right foot he did have one before his surgery in April. His wife by the dimensions of the wound/surgical site being followed at home since  4/20. At that point the dimensions were 0.5 x 12 x 0.2 on 7/19 this was 1.8 x 6 x 0.4. He is not currently on any antibiotics. His hemoglobin A1c in early April was 12.9 at that point he was started on insulin. Apparently his blood sugars are much lower he has an appointment with Dr. Legrand Como Alteimer of endocrine next week. 05/29/16 x-ray of the area did not show osteomyelitis. I think he probably needs an MRI at this point. His wife is asking about something called"Yireh" cream which is not FDA approved. I have not heard of this. 06/22/16; MRI did not really suggest osteomyelitis. There was minimal marrow edema and enhancement in the stump of the second metatarsal felt to be secondary likely to postoperative change rather than osteomyelitis. The patient has arranged his own consultation with Dr. Andree Elk at [place], apparently their daughter lives in [place] and has some connection here. Any improvement in vascular supply by Dr. Andree Elk would of course be helpful. Dr. Bridgett Larsson did not feel that anything further could be done other than amputation if wound care did not result in healing or if the area deteriorates. 06/26/16; the patient has been to see Dr. Andree Elk at [place] and had an angiogram. He is going for a procedure on Thursday which will involve catheterization. I'm not sure if this is an anterograde or retrograde approach. He has been using Santyl to the wound 07/10/16; the patient had a repeat angiogram and angioplasty at [place] by Dr. Brunetta Jeans. His angiogram showed right CFA and profundal widely patent. The right as of a.m. popliteal artery were widely patent the right anterior tibial was occluded proximally and reconstitutes at the ankle.  Peroneal artery was patent to the foot. Posterior tibial artery was occluded. The patient had angioplasty of the anterior tibial artery.. This was quite successful. He was recommended for Plavix as well as aspirin. 07/17/16; the patient was close to be a nurse visit today  however the outer dressing of the Apligraf fell off. Noted drainage. I was asked to see the wound. The patient is noted an odor however his wife had noted that. Drainage with Apligraf not necessarily a bad thing. He has not been systemically unwell 07/24/16; we are still have an issue with drainage of this wound. In spite of this I applied his second Apligraf. Medially the area still is probing to bone. 08/07/16; Apligraf reapplied in general wound looks improved. 08/21/16 Apligraf #4. Wound looks much better 09/04/16 patientt's wound again today continues to appear to improve with the application of the Apligraf's. He notes no increased discomfort or concerns at this point in time. 09/18/16; the patient returns today 2 weeks after his fifth application of Apligraf. Predictably three quarters of the width of this wound has healed. The deep area that probe to bone medially is still open. The patient asked how much out-of-pocket dollars would be for additional Apligraf's. 09/25/16; now using Hydrofera Blue. He has completed 5 Apligraf applications with considerable improvement in this deep open transmetatarsal amputation site. His wound is now a triangular-shaped wound on the medial aspect. At roughly 12 to 2:00 this probes another centimeter but as opposed to in the past this does not probe to bone. The patient has been seen at [facility] by Dr. Zenia Resides. He is not planning to do any more revascularization unless the wound stalls or worsens per the patient 10/02/16; 0.7 x 0.8 x 0.8. Unfortunately although the wound looks stable to improved. There is now easily probable bone. This hasn't been present for several weeks. Patient is not otherwise symptomatic he is not experiencing any pain. I did a culture of the wound bed 10/09/16. Deterioration last week. Culture grew MRSA and although there is improvement here with doxycycline prescribed over the phone I'm going to try to get him linezolid 600 twice a day for  10 days today. 10/16/16; he is completing a weeks worth of linezolid and still has 3 more days to go. Small triangular-shaped open area with some degree of undermining. There is still palpable bone with a curet. Overall the area appears better than last week 10/20/16 he has completed the linezolid still having some nausea and vomiting but no diarrhea. He has exposed bone this week which is a deterioration. 10/27/16 patient now has a small but probing wound down to bone. Culture of this bone that I did last week showed a few methicillin-resistant staph aureus. I have little doubt that this represents acute/subacute osteomyelitis. The patient is currently on Doxy which I will continue he also completed 10 days of linezolid. We are now in a difficult situation with this patient's foot after considerable discussion we will send him back to see Dr. Doran Durand for a surgical opinion of this I'm also going to try to arrange a infectious disease consult at [facility] hopefully week and get this prior to her usual 4-6 weeks we having [place]. The patient clearly is going to need 6 weeks of IV vancomycin. If we cannot arrange this expediently I'll have to consider ordering this myself through a home infusion company 11/03/16; the patient now has a small in terms of circumference but probing wound. No bone palpable today. The patient remains  on doxycycline 100 twice a day which should support him until he sees infectious disease at [facility] next week the following week on Wednesday I believe he has an appointment with Dr. Andree Elk at [facility] who is his vascular cardiologist. Finally he has an appointment with Dr. Doran Durand on 11/22/16 we have been using silver alginate. The patient's wife states they are having trouble getting this through [facility] 11/13/16; the patient was seen by infectious disease at [facility] in the 11th PICC line placed in preparation for IV antibiotics. A tummy he has not going to get IV vancomycin o  Ceftaroline. They've also ordered an MRI. Patient has a follow-up with Dr. Doran Durand on 11/22/16 and Dr. Andree Elk at [facility] tomorrow 11/23/16 the patient is on daptomycin as directed by infectious disease at [facility]. He is also been back to see Dr. Andree Elk at [facility]. He underwent a repeat arteriogram. He had a successful PTA of the right anterior tibial artery. He is on dual antiplatelete treatment with Plavix and aspirin. Finally he had the MRI of his foot in [place]. This showed cellulitis about the foot worse distally edema and enhancement in the reminiscent of the second metatarsal was consistent with osteomyelitis therefore what I was assuming to be the first metatarsal may be actually the second. He also has a fluid collection deep to the calcaneus at the level of the calcaneal spur which could be an abscess or due to adventitial bursitis. He had a small tear in his Achilles 11/30/16; the patient continues on daptomycin as directed by infectious disease at [facility]. He is been revascularized by Dr. Andree Elk at [place] in [place]. He has been to see Gretta Arab who was the orthopedic surgeon who did his original amputation. I have not seen his not however per the patient's wife he did not offer another surgical local surgical prodecure to remove involved bone. He verbalized his usual disbelief in not just hyperbarics but any medical therapy for this condition(osteomyelitis). I discussed this in detail with the patient today including answering the question about a BKA definitively "curing" the current condition. 12/07/16; the patient continues on daptomycin as directed by infectious disease at [facility]. This is directed at the MRSA that we cultured from his bone debridement from 12/22. Lab work today shows a white count of 8.7 hemoglobin of 10.5 which is microcytic and hypochromic differential count shows a slightly elevated monocyte count at 1.2 eosinophilic count of 0.6. His creatinine is  1.11 sedimentation rate apparently is gone from 35-34 now 40. I explained was wife I don't think this represents a trend. His total CK is 48 12/14/16- patient is here for follow-up evaluation of his right TMA site. He continues to receive IV daptomycin per infectious disease. His serum inflammatory markers remain elevated. He complains of intermittent pain to the medial aspect of the TMA site with intermittent erythema. He voices no complaints or concerns regarding hyperbaric therapy. Overall he and his wife are expressing a frustration and discouragement regarrding the length of time of treatment. 12/21/16; small open wound at roughly the first or second metatarsal metatarsalphalyngeal joint reminiscence of his transmetatarsal amputation site he continues to receive IV daptomycin per infectious disease at [facility]. He will finish these a week tomorrow. He has lab work which I been copied on. His white count is 10.8 hemoglobin 8.9 MCV is low at 75., MCH low at 24.5 platelet count slightly elevated at 626. Differential count shows 70% neutrophils 10% monocytes and 10% eosinophils. His comprehensive metabolic  panel shows a slightly low sodium at 133 albumin low at 3.1 total CK is normal at 42 sedimentation rate is much higher at 82. He has had iron studies that show a serum iron of 15 and iron binding capacity of 238 and iron saturation of 6. This is suggestive of iron deficiency. B12 and folate were normal ferritin at 113 The patient tells me that he is not eating well and he has lost weight. He feels episodically nauseated. He is coughing and gagging on mucus which she thinks is sinusitis. He has an appointment with his primary doctor at 5:00 this afternoon in [facility] 01/02/17; the patient developed a subacute pneumonitis. He was admitted to [facility] after a CT scan showed an extensive interstitial pneumonitis [I have not yet seen this]. 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 He has since been discharged. He is not on oxygen. He feels fatigued and very short of breath with exertion. At [facility] the wound care nurse there felt that his wound was healed. He did complete his daptomycin and has follow-up with infectious disease on Friday. X-rays I did before he went to [facility] still suggested residual osteomyelitis in the anterior aspect of the second must metatarsal head. I'm not sure I would've expected any different. There was no other findings. I actually think I did this because of erythema over the first metatarsal head reminiscent 01/11/17; the patient has eosinophilic pneumonitis. He has been reviewed by pulmonology and given clearance for hyperbarics at least that's what his wife says. I'll need to see if there is note in care everywhere. Apparently the prognosis for improvement of daptomycin induced eosinophilic granulocyte is is 3 months without steroids. In the meantime infectious disease has placed him on doxycycline until the wound is closed. He is still is lost a lot of weight and his blood sugars are running in the mid 60s to low 80s fasting and at all times during the day 01/18/17; he has had adjustments in his insulin apparently his blood sugars in the morning or over 100. He wants to restart his hyperbaric treatment we'll do this at 1:00. 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 I think there is good reason to complete his treatments in order to give him the best chance of maintaining a healed status and these DFU 3 wounds with MRSA infection in the bone 02/15/17; the patient was seen today in conjunction with HBO. He completed hyperbaric oxygen today. The open area on his transmetatarsal site has remained closed. There was an area of erythema when I saw him earlier in  the week on the posterior heel although that is resolved as of today as well. He has been using a cam walker. This is a patient who came to [place] after a transmetatarsal amputation that was necrotic and dehisced. He required revascularization percutaneously on 2 different occasions by Dr. Andree Elk of invasive cardiology at [facility]. 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 He went to [facility] infectious disease and had IV daptomycin for 5 weeks before developing eosinophilic pneumonitis and requiring an admission to hospital/ICU. He made a good recovery and is continued on doxycycline since. As mentioned his foot is closed now. He has a follow-up with Dr. Andree Elk tomorrow. He is going to [employer]  on Monday for a custom-made shoe READMISSION Last visit Dr. Andree Elk [facility] 02/16/17 1. Critical limb ischemia of the RLE: s/p transmetatarsal amputation of the RLE, now completely healed. s/p right ATA percutaneous revascularization procedure x2, most recently 11/20/2016 s/p PTA of right AT 100% to less than 20% with a 2.5 x 200 balloon. He does have some residual osteomyelitis, but given the wound is closed, the orthopedist has recommended to follow. He has a fitting for a special shoe coming up next week. He has completed a course of daptomycin and doxycycline and he has been discharged by ID. He has completed a course of hyperbaric therapy. Continue Continue medical management with aspirin, Plavix and statin therapy. We will discuss ongoing Plavix therapy at follow-up in 6 months. 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 Given no symptoms, we will conservatively manage. He doesn't want to do any more invasive studies at this time, which is reasonable. The patient arrives today  out of 2 concerns both on the right transmetatarsal site. 1 at the level of the reminiscent fifth metatarsal head and the other at roughly the first or second. Both of these look like dark subcutaneous discoloration probably subdermal bleeding. He is recently obtained new adaptive footwear for the right foot. He also has a callus on the left fifth dorsal toe however he follows with podiatry for this and I don't think this is any issue. ABIs in this clinic today were 0.66 on the right and 1.06 on the left. On entrance into our clinic initially this was 0.95 and 0.92. He does not describe current claudication. They're going away on a cruise in 8 weeks and I think are trying to do the month is much as they can proactively. They follow with podiatry and have an appointment with Dr. Andree Elk in October READMISSION 06/25/18 This is a patient that we have not seen in almost a year. He is a type II diabetic with known PAD. He is followed by Dr. Andree Elk of interventional cardiology at [facility] in [place]. He is required revascularization for significant PAD. When we first saw him he required a transmetatarsal amputation. He had underlying osteomyelitis with a nonhealing surgical wound. This eventually closed with wound care, IV antibiotics and hyperbaric oxygen. They tell me that he has a modified shoe and he is very active walking up to 4 miles a day. He is followed by Dr. Geroge Baseman of podiatry. His wife states that he underwent a removal of callus over this site on July 9. She felt there may be drainage from this site after that although she could never really determined and there was callus buildup again. On 06/01/18 there was pressure bleeding through the overlying callus. he was given a prescription for 7 days of Bactrim. Fortuitously he has an appointment with Dr. Andree Elk on 06/04/18 and he immediately underwent revascularization of the right leg although I have not had a chance to review these records  in care everywhere. This was apparently done through anterior and retrograde access. On 06/06/18 he had another debridement by Dr. Bernette Mayers. Vitamin soaking with Epsom salts for 20 minutes and applying calcium alginate. The original trans-met was on April 2017 I believe by Dr. Doran Durand. His ABI in our clinic was noncompressible today. 07/02/18; x-ray I ordered last week was negative for osteomyelitis. Swab culture was also negative. He is going to require an  MRI which I have ordered today. 07/09/18; surprisingly the MRI of the foot that I ordered did not show osteomyelitis. He did suggest the possibility of cellulitis. For this reason I'll go ahead and give him a 10 day course of doxycycline. Although the previous culture of this area was negative 07/16/18; it arrives with the wound looking much the same. Roughly the same depth. He has thick subcutaneous tissue around the wound orifice but this still has roughly the same depth. Been using silver alginate. We applied Oasis #1 today 07/23/2018; still having to remove a lot of callus and thick subcutaneous tissue to actually define the wound here. Most of this seems to have closed down yet he has a comma shaped divot over the top of the area that still I think is open. We applied Oasis #2 His wife expressed concern about the tip of his left great toe. This almost looks like a small blister. She also showed it today to her podiatrist Dr. Geroge Baseman who did not think this was anything serious. I am not sure is anything serious either however I think it bears some watching. He is not in any pain however he is insensate 07/30/2018;; still on a lot of nonviable tissue over the surface of the wound however cleaning this up reveals a more substantial wound orifice but less of a probing wound depth. This is not probed to bone. There is no evidence of infection The wife is still concerned about a non-open area on the tip of his left great toe. Almost feels like a bony  outgrowth. She had previously showed this to podiatry. I do not think this is a blister. A friction area would be possible although he is really not walking according to his wife. He had a small skin tag on his right buttock but no open wound here either 08/06/2018; we applied a third Oasis last week. Unfortunately there is really no improvement. Still requiring extensive debridement to expose the wound bed from a horizontal slitlike depression. I still have not been able to get this to fill in properly. On the positive side there is now no probable bone from when he first came into the facility. I changed him to silver alginate today after a reasonably aggressive debridement 08/13/2018; once again the patient comes in with skin and subcutaneous tissue closing over the small probing area with the underlying cavity of the wound on the right TMA site. I applied silver alginate to this last week. Prior to that we used Oasis x3 still not able to get this area granulating. He does not have a probing area of the bone which is an improvement from when I spur started working on this and an MRI did not suggest osteomyelitis. I do not see evidence of infection here but I am increasingly concerned about why I cannot get this area to granulate. Each time I debrided this is looks like this is simply a matter of getting granulation to fill in the hole and then getting epithelialization. This does not seem to happen Also I sent him back to see podiatry Dr. Earleen Newport about the what felt to be bony outgrowth on the tip of his left great toe. Apparently after heel he left the clinic last week or the next day he developed a blood blister. He did see Dr. Earleen Newport. He went on to have a debridement of the medial nail cuticle he now has an open area here as well as some denuded skin. They did an  x-ray apparently does have a bony outgrowth or spur but I am not able to look at this. They are using topical antibiotics apparently  there was some suggested he use Santyl. Patient's wife was anxious for my opinion of this 08/20/2018; we are able to keep the wound open this time instead of the thick subcutaneous tissue closing over the top of it however unfortunately once again this probes to bone. I do not see any evidence of infection and previous MRI did not show osteomyelitis. I elected to go back to the Oasis to see if we can stimulate some granulation. Last saw his vascular interventional cardiologist Dr. Andree Elk at the beginning of August and he had a repeat procedure. Nevertheless I wonder how much blood flow he has down to this area With regards to the left first toe he is seeing Dr. Earleen Newport next week. They are applying Bactroban to this area. 08/27/2018 Once again he comes in with thick eschar and subcutaneous tissue over the top of the small probing hole. This does not appear to go down to bone but it still has roughly the same depth. I reapplied Oasis today Over the left great toe there appears to be more of the wound at the tip of his toe than there was last week. This has an eschar on the surface of it. Will change to Santyl. There is seeing podiatry this afternoon 09/03/2018 He comes in today with the area on the transmetatarsal's site with a fair amount of callus, nonviable tissue over the circumference but it was not closed. I removed all of this as well as some subcutaneous debris and reapplied Oasis. There is no exposed bone The area over the tip of the left great toe started off as a nodule of uncertain etiology. He has been followed with podiatry. They have been removing part of the medial nail bed. He has nonviable tissue over the wound he been using Santyl in this area 09/10/18 Unfortunately comes in with neither wound area looking improved. The transmetatarsal amputation site once Again has nonviable debris over the surface requiring debridement. Unfortunately underneath this there is nothing that looks  viable and this once again goes right down to bone. There is no purulent drainage and no erythema. Also the surgical wound from podiatry on the left first toe has an ischemic-looking eschar over the surface of the tip of the toe I have elected not to attempt candidly debride this His wife as arranged for him to have follow-up noninvasive studies in [place] under the care of Dr. Andree Elk clinic. The question is he has known severe PAD. They had recently seen him in August. He has had revascularizations in both legs within the last 4 or 5 months. He is not complaining of pain and I cannot really get a history of claudication. He has not systemically unwell 09/20/2018 the patient has been to [facility] and been revascularized by Dr. Andree Elk earlier this week. Apparently he was able to open up the anterior tibial artery although I have not actually seen his formal report. He is going for an attempt to revascularize on the left on Monday. He is apparently working with an investigational stent for lower extremity arteries below the knee and he has talked to the patient about placing that on Monday if possible. The patient has been using silver alginate on the transmetatarsal amputation site and Santyl on the left 09/30/2018; patient had his revascularization on the left this apparently included a standard approach as well  as a more distal arterial catheterization although I do not have any information on this from Dr. Andree Elk. In fact I do not even see the initial revascularization that he had on the right. I have included the arterial history from Dr. Andree Elk last note however below; ASSESSMENT/PLAN: 1. Hx of Critical limb ischemia bilateral lower extremities, PAD: -s/p transmetatarsal amputation of the RLE. -s/p right ATA percutaneous revascularization procedure x2, most recently 11/20/2016 s/p PTA of right AT 100% to less than 20% with a 2.5 x 200 balloon. -On original angiogram 05/15/2016, he had a  significant 70-95% left popliteal artery stenosis with AT and PT artery occlusions and one vessel runoff via the peroneal artery. -03/21/2018 s/p PTA of 90% left popliteal artery to <10% with a 5x20 cutting balloon, PTA of 90% left peroneal to <20% with a 3x20 balloon, PTA of 100% left AT to <20% with a 2.5x220 balloon. 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 Will plan on the LLE soon after. Risks/benefits of procedure have been discussed and patient has elected to proceed. We have been using endoform to the right TMA amputation site wound and Santyl to the left great toe 10/07/2018; the area on the tip of his left great toe looked better we have been using Santyl here. We continue to have a very difficult probing hole on the right TMA amputation site we have been using endoform. I went on to use his fifth Oasis today 10/14/2018; the tip of the left great toe continues to look better. We have been using Santyl here the surface however is healthy and I think we can change to an alginate. The right TMA has not changed. Once again he has no superficial opening there is callus and thick subcutaneous tissue over the orifice once you remove this there is the probing area that we have been dealing with without too much change. There is no palpable bone I have been placing Oasis here and put Oasis #6 in this today after a more vigorous debridement 10/21/18; the left great toe still has necrotic surface requiring debridement. The right TMA site hasn't changed in view of the thick callus over the wound bed. With removal of this there is still the opening however this does not appear to have the same depth. Again there is no palpable bone. Oasis was replaced 10/28/2018; patient comes in with both wounds looking worse. The area over the first toe tip is now down  to bone. The area over the TMA site is deeper and down to bone clearly with a increase in overall wound area. Equally concerning on the right TMA is the complete absence of a pulse this week which is a change. We are not even able to Doppler this. On the right he has a noncompressible ABI greater than 1.4 11/04/2018. Both wounds look somewhat worse. X-rays showed no osteomyelitis of the right foot but on the left there was underlying osteomyelitis in the left great toe distal phalanx. I been on the phone to Dr. Andree Elk surface at [facility] in [place] and they are arranging for another angiogram on the right on Monday. I have him on doxycycline for the osteomyelitis in the left great toe for now. Infectious disease may be necessary 1/17; 2-week hiatus. Patient was admitted to hospital at [place]. My understanding is he underwent an angioplasty of the right  anterior tibial artery and had stents placed in the left anterior artery and the left tibial peroneal trunk. This was done by Dr. Andree Elk of interventional radiology. There is no major change in either 1 of the wounds. They have been using Aquacel Ag. As far as they are aware no imaging studies were done of the foot which is indeed unfortunate. I had him on doxycycline for 2 weeks since we identified the osteomyelitis in the left great toe by plain x-ray. I have renewed that again today. I am still suspicious about osteomyelitis in the amputation site and would consider doing another MRI to compare with the one done in September. The idea of hyperbaric oxygen certainly comes up for discussion 1/24; no major change in either wound area. I have him on doxycycline for osteomyelitis at the tip of the left great toe. As noted he has been previously and recently revascularized by Dr. Andree Elk at [place]. He is tolerating the doxycycline well. For some reason we do not have an infectious disease consult yet. Culture of drainage from the right foot site last week was  negative 1/31; MRI of the right foot did not show osteomyelitis of the right ankle and foot. Notable for a skin ulceration overlying the second metatarsal stump with generalizing soft tissue edema of the ankle and foot consistent with cellulitis. Noted to have a partial-thickness tear of the Achilles tendon 7.5 cm proximal to the insertion Nothing really new in terms of symptoms. Patient's area on the tip of the left great toe is just about closed although he still has the probing area on the metatarsal amputation site. Appointment with Dr. Linus Salmons of infectious disease next week 2/7; Dr. Novella Olive did not feel that any further antibiotics were necessary he would follow-up in 2 months. The left great toe appears to be closed still some surface callus that I gently looked under high did not see anything open or anything that was threatening to be open. He still has the open area on the mid part of his TMA site using endoform 2/14; left great toe is closed and he is completing his doxycycline as of last Sunday. He is not on any antibiotics. Unfortunately out of the right foot his wife noticed some subdermal hemorrhage this week. He had been walking 2 miles I had given him permission to do so this is not really a plantar wound. Using endoform to this wound but I changed to silver alginate this week 2/21; left great toe remains closed. Culture last week grew Streptococcus angiosis which I am not really familiar with however it is penicillin sensitive and I am going to put him on Augmentin. Not much change in the wound on the right foot the deep area is still probing precariously close to bone and the wound on the margin of the TMA is larger. 2/28; left great toe remains closed. He is completing the Augmentin I gave him last week. 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 This is being shown to Dr. Andree Elk at [place] to see if there is anything else that can be done here. He  has been using silver alginate strips on the right 3/6; left great toe remains closed. He sees Dr. Andree Elk on Monday. The area on the plantar aspect of the right foot has the same small orifice with thick callused tissue around this. However this time with removal of the callus tissue the wound is open all the way along the incision line  to the end medially. We have been using silver alginate. 3/13; left toe remains closed although the area is callused. He sees Dr. Jacqualyn Posey of podiatry next week. The area on the plantar right foot looked a lot better this week. Culture I did of this was negative we use silver alginate. He is going next week for an attempt at revascularization by Dr. Andree Elk 3/23; left toe remains closed although the area is callused. He will see Dr. Jacqualyn Posey in follow-up. The area on the right plantar foot continues to look surprisingly better over the last 3 visits. We have been using silver alginate. The revascularization he was supposed to have by Dr. Andree Elk at [facility] in [place] has been canceled [facility] procedure] 4/6; the right foot remains closed albeit callused. Podiatry canceled the appointment with regards to the left great toe. He has not seen Dr. Andree Elk at [facility] but thinks that Dr. Andree Elk has "done all he can do". He would be a candidate for the hemostaemix trial Readmission 07/29/2019 Mr. Stann Mainland is a man we know well from at least 3 previous stays in this clinic. He is a type II diabetic with severe PAD followed by Dr. Andree Elk at [facility] in [place]. 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 So far everything in both of these areas has remained closed. About 2 weeks ago he went to see his podiatrist at [facility] Dr. Babs Bertin. He had a thick callus on the left fifth metatarsal head that was shaved. He has developed an open wound in this  area. They have been offloading this in his diabetic shoes. The patient has not had any more revascularizations by Dr. Andree Elk since the last time he was here. ABI in our clinic at the posterior tibial on the left was 1.06 10/6; no real change in the area on the plantar met head. Small wound with 2 mm of depth. He has thick skin probably from pressure around the wound. We have been using silver alginate 10/13; small wound in the left fifth plantar met head. Arrives today with undermining laterally and purulent drainage. Our intake nurse cultured this. His wife stated they noticed a change in color over the last day or 2. He is not systemically unwell 10/19; small wound on the fifth plantar metatarsal head. Culture I did last week showed Staphylococcus lugdunensis. Although this could be a skin contaminant the possibility of a skin and soft tissue infection was there. I did give him empiric doxycycline which should have covered this. We are using silver alginate to the wound. We put him in a total contact cast today. 10/22; small wound on the fifth plantar metatarsal head. He has completed antibiotics. He also has severe PAD which worries me about just about any wound on this man's foot 10/29; small superficial area on the fifth plantar metatarsal head. Measuring slightly smaller. He is not currently on any antibiotics. I been using a total contact cast in this man with very severe PAD but he seems to be tolerating this well 11/5; left plantar fifth metatarsal head. Silver alginate being used under a total contact cast 11/12; wound not much different than last week. Using a #15 scalpel debridement around the wound. Still silver collagen under a total contact cast. He has severe PAD and that may be playing a role in this 11/19;  disappointing that the wound is not really changed that much. I think it is come down in overall surface area because originally this was on the lateral part of the fifth  metatarsal head however recently it is not really changed. Most of this is filled in but it will not epithelialized there is surface debris on this which may be mostly related to ischemia. They have an appointment with Dr. Andree Elk on 12/9 09/24/2019 on evaluation today patient appears to be doing somewhat better with regard to the wound on the left fifth metatarsal head. Fortunately there does not appear to be any signs of active infection at this time. No fevers, chills, nausea, vomiting, or diarrhea. The wound does not appear to be completely closed and I do feel like the Hydrofera Blue is helping to some degree although I feel like the cast as well as keeping things from breaking down or getting any larger at least. As far as his wound overview it shows that the overall size of the wound is slightly smaller but really maintaining within the realm of about the same. He had no troubles with the cast which is good news. 12/1; left fifth metatarsal head. Perhaps somewhat more vibrant. We have been using Hydrofera Blue. He sees Dr. Andree Elk at [facility] 1 week tomorrow. He be back next week and I hope to have a better idea which way the wound is going however I will not cast him next week in case Dr. Andree Elk wants to reevaluate things in his foot. The left leg is the leg with the stent in place and I wonder whether these are going to have to be reevaluated. 12/8; left fifth metatarsal head. Quite a bit better this week. Only a small open area remains. He sees Dr. Andree Elk at [place] tomorrow. Dr. Andree Elk is previously done his vascular interventions. He has 2 stents in the left leg as I remember things. I therefore will not put him in a total contact cast today. He will be using a forefoot off loader. We will use silver collagen on the wound. We will see him again next week 12/15; left fifth metatarsal head. He saw Dr. Andree Elk and is scheduled for an angiogram on Thursday. Unfortunately although his wound was a lot better  last week it is really deteriorated this week. Small punched-out hole with depth and overhanging tissue. We have been using Hydrofera Blue 12/22; patient had an 80% stenosis proximal to the stent I believe in the below-knee popliteal artery. This was opened by Dr. Andree Elk. According the patient the stent itself was satisfactory. I have not been able to review this note. 12/29; patient arrives today with the wound about the same size some undermining medially. We have been using Hydrofera Blue under a total contact cast and that is what we will do again today 11/04/2019. Slightly smaller in orifice but about 4 mm undermining almost circumferentially. We have been using Hydrofera Blue. Apparently the Hydrofera Blue did not stay on the wound after we removed the cast. Some minor looking cast irritation on the right lateral 1/12; unfortunately things did not go well this week. Arrives in clinic today with a deeper wound with undermining more concerning a area of pus. Specimen was obtained for culture. We are not going to be able to put him in a cast today. 1/19; purulent material last week which I cultured showed Enterobacter cloacae. He was on ampicillin previously prescribed by his primary doctor which should not have covered this however mysteriously  the wound looks somewhat better. There is less depth less erythema certainly no drainage. He will need a course of ciprofloxacin which I will provide today. Electronic Signature(s) Signed: 11/18/2019 5:52:07 PM By: [name] MD Entered By: [name] on 11/18/2019 13:06:19 -------------------------------------------------------------------------------- Physical Exam Details Patient Name: Date of Service: [name], [name] 11/18/2019 12:30 PM Medical Record [id number] Patient Account Number: [id number] Date of Birth/Sex: Treating RN: November 22, 1945 (74 y.o. M) Primary Care Provider: [name] Other Clinician: Referring Provider:  Treating Provider/Extender:Ramyah Pankowski, [name], [name] in Treatment: 35 Constitutional Patient is hypertensive.. Pulse regular and within target range for patient.Marland Kitchen Respirations regular, non-labored and within target range.. Temperature is normal and within the target range for the patient.Marland Kitchen Appears in no distress. Cardiovascular I did not feel his dorsalis pedis pulse on the left. Notes Wound exam; no purulent drainage this week. No erythema around the wound there is some skin that I removed with pickups and scissors although this was not in the wound area. There is no tenderness. No tenderness of the MTP Electronic Signature(s) Signed: 11/18/2019 5:52:07 PM By: [name] MD Entered By: [name] on 11/18/2019 13:07:25 -------------------------------------------------------------------------------- Physician Orders Details Patient Name: Date of Service: [name], [name] 11/18/2019 12:30 PM Medical Record [id number] Patient Account Number: [id number] Date of Birth/Sex: Treating RN: 07/10/1946 (73 y.o. [name]) [name] Primary Care Provider: [name] Other Clinician: Referring Provider: Treating Provider/Extender:Clayborne Divis, [name], [name] in Treatment: 16 Verbal / Phone Orders: No Diagnosis Coding ICD-10 Coding Code Description E11.621 Type 2 diabetes mellitus with foot ulcer E11.51 Type 2 diabetes mellitus with diabetic peripheral angiopathy without gangrene L97.521 Non-pressure chronic ulcer of other part of left foot limited to breakdown of skin L03.116 Cellulitis of left lower limb Follow-up Appointments Return Appointment in 1 week. Dressing Change Frequency Wound #5 Left Metatarsal head fifth Change dressing every day. Wound Cleansing Wound #5 Left Metatarsal head fifth May shower and wash wound with soap and water. Primary Wound Dressing Wound #5 Left Metatarsal head fifth Calcium Alginate with Silver Other: - pad right great toe  for protection Secondary Dressing Kerlix/Rolled Gauze Dry Gauze Off-Loading Other: - Fore foot off loader Patient Medications Allergies: daptomycin Notifications Medication Indication Start End cefdinir 11/18/2019 DOSE oral 300 mg capsule - 1capsule oral bid for 7 days Electronic Signature(s) Signed: 11/18/2019 1:12:59 PM By: [name] MD Entered By: [name] on 11/18/2019 13:12:58 -------------------------------------------------------------------------------- Problem List Details Patient Name: Date of Service: [name], [name] 11/18/2019 12:30 PM Medical Record [id number] Patient Account Number: [id number] Date of Birth/Sex: Treating RN: 06/30/1946 (73 y.o. [name] Primary Care Provider: [name] Other Clinician: Referring Provider: Treating Provider/Extender:Latania Bascomb, [name], [name] in Treatment: 16 Active Problems ICD-10 Evaluated Encounter Code Description Active Date Today Diagnosis E11.621 Type 2 diabetes mellitus with foot ulcer 07/29/2019 No Yes E11.51 Type 2 diabetes mellitus with diabetic peripheral 07/29/2019 No Yes angiopathy without gangrene L97.521 Non-pressure chronic ulcer of other part of left foot 07/29/2019 No Yes limited to breakdown of skin L03.116 Cellulitis of left lower limb 08/12/2019 No Yes Inactive Problems Resolved Problems Electronic Signature(s) Signed: 11/18/2019 5:52:07 PM By: [name] MD Entered By: [name] on 11/18/2019 13:03:46 -------------------------------------------------------------------------------- Progress Note Details Patient Name: Date of Service: [name], [name] 11/18/2019 12:30 PM Medical Record [id number] Patient Account Number: [id number] Date of Birth/Sex: Treating RN: [birth year] (74 y.o. M) Primary Care Provider: [name] Other Clinician: Referring Provider: Treating Provider/Extender:Dorsel Flinn, [name], [name] in Treatment:  16 Subjective History of Present Illness (HPI) 05/18/16; this  is a 74year-old diabetic who is a type II diabetic on insulin. The history is that he traumatized his right foot developed a sore sometime in late March. Shortly thereafter he went on a cruise but he had to get off the cruise ship in [place] and fly urgently back to [place] where he was admitted to [facility] and ultimately underwent a transmetatarsal amputation by Dr. 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 According to the patient and his wife this wound never really healed. He was seen on 2 occasions in the wound care center in [place] and had vascular studies and then was referred urgently to Dr. Bridgett Larsson of vascular surgery. He underwent an angiogram on 05/10/16. Unfortunately nothing really could be done to improve his vascular status. He had a 75-90% stenosis in the midsegment of 1 segment of the posterior femoral artery. He had a patent popliteal, his anterior tibial occluded shortly after takeoff. Perineal had a greater than 90% stenosis posterior tibial is occluded feet had no distal collaterals feed distal aspect of the transmetatarsal amputation site. The patient tells me that he had a prolonged period of Santyl by Dr. Doran Durand was some initial improvement but then this was stopped. I think they're only applying daily dressings/dry dressings. He has not had a recent x-ray of the right foot he did have one before his surgery in April. His wife by the dimensions of the wound/surgical site being followed at home since 4/20. At that point the dimensions were 0.5 x 12 x 0.2 on 7/19 this was 1.8 x 6 x 0.4. He is not currently on any antibiotics. His hemoglobin A1c in early April was 12.9 at that point he was started on insulin. Apparently his blood sugars are much lower he has an appointment with Dr. Legrand Como Alteimer of endocrine next week. 05/29/16 x-ray of the area did not show osteomyelitis. I think he probably needs an MRI  at this point. His wife is asking about something called"Yireh" cream which is not FDA approved. I have not heard of this. 06/22/16; MRI did not really suggest osteomyelitis. There was minimal marrow edema and enhancement in the stump of the second metatarsal felt to be secondary likely to postoperative change rather than osteomyelitis. The patient has arranged his own consultation with Dr. Andree Elk at [place], apparently their daughter lives in [place] and has some connection here. Any improvement in vascular supply by Dr. Andree Elk would of course be helpful. Dr. Bridgett Larsson did not feel that anything further could be done other than amputation if wound care did not result in healing or if the area deteriorates. 06/26/16; the patient has been to see Dr. Andree Elk at [place] and had an angiogram. He is going for a procedure on Thursday which will involve catheterization. I'm not sure if this is an anterograde or retrograde approach. He has been using Santyl to the wound 07/10/16; the patient had a repeat angiogram and angioplasty at [place] by Dr. Brunetta Jeans. His angiogram showed right CFA and profundal widely patent. The right as of a.m. popliteal artery were widely patent the right anterior tibial was occluded proximally and reconstitutes at the ankle. Peroneal artery was patent to the foot. Posterior tibial artery was occluded. The patient had angioplasty of the anterior tibial artery.. This was quite successful. He was recommended for Plavix as well as aspirin. 07/17/16; the patient was close to be a nurse visit today however the outer dressing of the Apligraf fell off. Noted drainage. I was asked to see  the wound. The patient is noted an odor however his wife had noted that. Drainage with Apligraf not necessarily a bad thing. He has not been systemically unwell 07/24/16; we are still have an issue with drainage of this wound. In spite of this I applied his second Apligraf. Medially the area still is probing to  bone. 08/07/16; Apligraf reapplied in general wound looks improved. 08/21/16 Apligraf #4. Wound looks much better 09/04/16 patientt's wound again today continues to appear to improve with the application of the Apligraf's. He notes no increased discomfort or concerns at this point in time. 09/18/16; the patient returns today 2 weeks after his fifth application of Apligraf. Predictably three quarters of the width of this wound has healed. The deep area that probe to bone medially is still open. The patient asked how much out-of-pocket dollars would be for additional Apligraf's. 09/25/16; now using Hydrofera Blue. He has completed 5 Apligraf applications with considerable improvement in this deep open transmetatarsal amputation site. His wound is now a triangular-shaped wound on the medial aspect. At roughly 12 to 2:00 this probes another centimeter but as opposed to in the past this does not probe to bone. The patient has been seen at [place] by Dr. Zenia Resides. He is not planning to do any more revascularization unless the wound stalls or worsens per the patient 10/02/16; 0.7 x 0.8 x 0.8. Unfortunately although the wound looks stable to improved. There is now easily probable bone. This hasn't been present for several weeks. Patient is not otherwise symptomatic he is not experiencing any pain. I did a culture of the wound bed 10/09/16. Deterioration last week. Culture grew MRSA and although there is improvement here with doxycycline prescribed over the phone I'm going to try to get him linezolid 600 twice a day for 10 days today. 10/16/16; he is completing a weeks worth of linezolid and still has 3 more days to go. Small triangular-shaped open area with some degree of undermining. There is still palpable bone with a curet. Overall the area appears better than last week 10/20/16 he has completed the linezolid still having some nausea and vomiting but no diarrhea. He has exposed bone this week which is a  deterioration. 10/27/16 patient now has a small but probing wound down to bone. Culture of this bone that I did last week showed a few methicillin-resistant staph aureus. I have little doubt that this represents acute/subacute osteomyelitis. The patient is currently on Doxy which I will continue he also completed 10 days of linezolid. We are now in a difficult situation with this patient's foot after considerable discussion we will send him back to see Dr. Doran Durand for a surgical opinion of this I'm also going to try to arrange a infectious disease consult at [facility] hopefully week and get this prior to her usual 4-6 weeks we having [place]. The patient clearly is going to need 6 weeks of IV vancomycin. If we cannot arrange this expediently I'll have to consider ordering this myself through a home infusion company 11/03/16; the patient now has a small in terms of circumference but probing wound. No bone palpable today. The patient remains on doxycycline 100 twice a day which should support him until he sees infectious disease at [facility] next week the following week on Wednesday I believe he has an appointment with Dr. Andree Elk at [facility] who is his vascular cardiologist. Finally he has an appointment with Dr. Doran Durand on 11/22/16 we have been using silver alginate. The patient's wife states  they are having trouble getting this through [facility] 11/13/16; the patient was seen by infectious disease at [facility] in the 11th PICC line placed in preparation for IV antibiotics. A tummy he has not going to get IV vancomycin o Ceftaroline. They've also ordered an MRI. Patient has a follow-up with Dr. Doran Durand on 11/22/16 and Dr. Andree Elk at [facility] tomorrow 11/23/16 the patient is on daptomycin as directed by infectious disease at [facility]. He is also been back to see Dr. Andree Elk at [facility]. He underwent a repeat arteriogram. He had a successful PTA of the right anterior tibial artery. He is on dual antiplatelete  treatment with Plavix and aspirin. Finally he had the MRI of his foot in [place]. This showed cellulitis about the foot worse distally edema and enhancement in the reminiscent of the second metatarsal was consistent with osteomyelitis therefore what I was assuming to be the first metatarsal may be actually the second. He also has a fluid collection deep to the calcaneus at the level of the calcaneal spur which could be an abscess or due to adventitial bursitis. He had a small tear in his Achilles 11/30/16; the patient continues on daptomycin as directed by infectious disease at [facility]. He is been revascularized by Dr. Andree Elk at [place] in [place]. He has been to see Gretta Arab who was the orthopedic surgeon who did his original amputation. I have not seen his not however per the patient's wife he did not offer another surgical local surgical prodecure to remove involved bone. He verbalized his usual disbelief in not just hyperbarics but any medical therapy for this condition(osteomyelitis). I discussed this in detail with the patient today including answering the question about a BKA definitively "curing" the current condition. 12/07/16; the patient continues on daptomycin as directed by infectious disease at [facility]. This is directed at the MRSA that we cultured from his bone debridement from 12/22. Lab work today shows a white count of 8.7 hemoglobin of 10.5 which is microcytic and hypochromic differential count shows a slightly elevated monocyte count at 1.2 eosinophilic count of 0.6. His creatinine is 1.11 sedimentation rate apparently is gone from 35-34 now 40. I explained was wife I don't think this represents a trend. His total CK is 48 12/14/16- patient is here for follow-up evaluation of his right TMA site. He continues to receive IV daptomycin per infectious disease. His serum inflammatory markers remain elevated. He complains of intermittent pain to the medial aspect of the TMA site  with intermittent erythema. He voices no complaints or concerns regarding hyperbaric therapy. Overall he and his wife are expressing a frustration and discouragement regarrding the length of time of treatment. 12/21/16; small open wound at roughly the first or second metatarsal metatarsalphalyngeal joint reminiscence of his transmetatarsal amputation site he continues to receive IV daptomycin per infectious disease at [facility]. He will finish these a week tomorrow. He has lab work which I been copied on. His white count is 10.8 hemoglobin 8.9 MCV is low at 75., MCH low at 24.5 platelet count slightly elevated at 626. Differential count shows 70% neutrophils 10% monocytes and 10% eosinophils. His comprehensive metabolic panel shows a slightly low sodium at 133 albumin low at 3.1 total CK is normal at 42 sedimentation rate is much higher at 82. He has had iron studies that show a serum iron of 15 and iron binding capacity of 238 and iron saturation of 6. This is suggestive of iron deficiency. B12 and folate were normal ferritin at  113 The patient tells me that he is not eating well and he has lost weight. He feels episodically nauseated. He is coughing and gagging on mucus which she thinks is sinusitis. He has an appointment with his primary doctor at 5:00 this afternoon in [facility] 01/02/17; the patient developed a subacute pneumonitis. He was admitted to [facility] after a CT scan showed an extensive interstitial pneumonitis [I have not yet seen this]. 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 He has since been discharged. He is not on oxygen. He feels fatigued and very short of breath with exertion. At [facility] the wound care nurse there felt that his wound was healed. He did complete his daptomycin and has follow-up with infectious disease on Friday. X-rays I did before he went to [facility]  still suggested residual osteomyelitis in the anterior aspect of the second must metatarsal head. I'm not sure I would've expected any different. There was no other findings. I actually think I did this because of erythema over the first metatarsal head reminiscent 01/11/17; the patient has eosinophilic pneumonitis. He has been reviewed by pulmonology and given clearance for hyperbarics at least that's what his wife says. I'll need to see if there is note in care everywhere. Apparently the prognosis for improvement of daptomycin induced eosinophilic granulocyte is is 3 months without steroids. In the meantime infectious disease has placed him on doxycycline until the wound is closed. He is still is lost a lot of weight and his blood sugars are running in the mid 60s to low 80s fasting and at all times during the day 01/18/17; he has had adjustments in his insulin apparently his blood sugars in the morning or over 100. He wants to restart his hyperbaric treatment we'll do this at 1:00. 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 I think there is good reason to complete his treatments in order to give him the best chance of maintaining a healed status and these DFU 3 wounds with MRSA infection in the bone 02/15/17; the patient was seen today in conjunction with HBO. He completed hyperbaric oxygen today. The open area on his transmetatarsal site has remained closed. There was an area of erythema when I saw him earlier in the week on the posterior heel although that is resolved as of today as well. He has been using a cam walker. This is a patient who came to [place] after a transmetatarsal amputation that was necrotic and dehisced. He required revascularization percutaneously on 2 different occasions by Dr. Andree Elk of invasive cardiology at [facility]. 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 He went to [facility] infectious disease and had IV daptomycin for 5 weeks before developing eosinophilic pneumonitis and requiring an admission to hospital/ICU. He made a good recovery and is continued on doxycycline since. As mentioned his foot is closed now. He has a follow-up with Dr. Andree Elk tomorrow. He is going to [employer] on Monday for a custom-made shoe READMISSION Last visit Dr. Andree Elk [facility] 02/16/17 1. Critical limb ischemia of the RLE:  s/p transmetatarsal amputation of the RLE, now completely healed.  s/p right ATA percutaneous revascularization procedure x2, most recently 11/20/2016 s/p PTA of right AT 100% to less than 20% with a 2.5 x 200 balloon.  He  does have some residual osteomyelitis, but given the wound is closed, the orthopedist has recommended to follow. He has a fitting for a special shoe coming up next week. He has completed a course of daptomycin and doxycycline and he has been discharged by ID. He has completed a course of hyperbaric therapy.  Continue Continue medical management with aspirin, Plavix and statin therapy. We will discuss ongoing Plavix therapy at follow-up in 6 months.  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 Given no symptoms, we will conservatively manage. He doesn't want to do any more invasive studies at this time, which is reasonable. The patient arrives today out of 2 concerns both on the right transmetatarsal site. 1 at the level of the reminiscent fifth metatarsal head and the other at roughly the first or second. Both of these look like dark subcutaneous discoloration probably subdermal bleeding. He is recently obtained new adaptive footwear for the right foot. He also has a callus on the left fifth dorsal toe however he follows with  podiatry for this and I don't think this is any issue. ABIs in this clinic today were 0.66 on the right and 1.06 on the left. On entrance into our clinic initially this was 0.95 and 0.92. He does not describe current claudication. They're going away on a cruise in 8 weeks and I think are trying to do the month is much as they can proactively. They follow with podiatry and have an appointment with Dr. Andree Elk in October READMISSION 06/25/18 This is a patient that we have not seen in almost a year. He is a type II diabetic with known PAD. He is followed by Dr. Andree Elk of interventional cardiology at [facility] in [place]. He is required revascularization for significant PAD. When we first saw him he required a transmetatarsal amputation. He had underlying osteomyelitis with a nonhealing surgical wound. This eventually closed with wound care, IV antibiotics and hyperbaric oxygen. They tell me that he has a modified shoe and he is very active walking up to 4 miles a day. He is followed by Dr. Geroge Baseman of podiatry. His wife states that he underwent a removal of callus over this site on July 9. She felt there may be drainage from this site after that although she could never really determined and there was callus buildup again. On 06/01/18 there was pressure bleeding through the overlying callus. he was given a prescription for 7 days of Bactrim. Fortuitously he has an appointment with Dr. Andree Elk on 06/04/18 and he immediately underwent revascularization of the right leg although I have not had a chance to review these records in care everywhere. This was apparently done through anterior and retrograde access. On 06/06/18 he had another debridement by Dr. Bernette Mayers. Vitamin soaking with Epsom salts for 20 minutes and applying calcium alginate. The original trans-met was on April 2017 I believe by Dr. Doran Durand. His ABI in our clinic was noncompressible today. 07/02/18; x-ray I ordered last week was negative for  osteomyelitis. Swab culture was also negative. He is going to require an MRI which I have ordered today. 07/09/18; surprisingly the MRI of the foot that I ordered did not show osteomyelitis. He did suggest the possibility of cellulitis. For this reason I'll go ahead and give him a 10 day course of doxycycline. Although the previous culture of this area was negative 07/16/18; it arrives with the  wound looking much the same. Roughly the same depth. He has thick subcutaneous tissue around the wound orifice but this still has roughly the same depth. Been using silver alginate. We applied Oasis #1 today 07/23/2018; still having to remove a lot of callus and thick subcutaneous tissue to actually define the wound here. Most of this seems to have closed down yet he has a comma shaped divot over the top of the area that still I think is open. We applied Oasis #2 His wife expressed concern about the tip of his left great toe. This almost looks like a small blister. She also showed it today to her podiatrist Dr. Geroge Baseman who did not think this was anything serious. I am not sure is anything serious either however I think it bears some watching. He is not in any pain however he is insensate 07/30/2018;; still on a lot of nonviable tissue over the surface of the wound however cleaning this up reveals a more substantial wound orifice but less of a probing wound depth. This is not probed to bone. There is no evidence of infection The wife is still concerned about a non-open area on the tip of his left great toe. Almost feels like a bony outgrowth. She had previously showed this to podiatry. I do not think this is a blister. A friction area would be possible although he is really not walking according to his wife. He had a small skin tag on his right buttock but no open wound here either 08/06/2018; we applied a third Oasis last week. Unfortunately there is really no improvement. Still requiring  extensive debridement to expose the wound bed from a horizontal slitlike depression. I still have not been able to get this to fill in properly. On the positive side there is now no probable bone from when he first came into the facility. I changed him to silver alginate today after a reasonably aggressive debridement 08/13/2018; once again the patient comes in with skin and subcutaneous tissue closing over the small probing area with the underlying cavity of the wound on the right TMA site. I applied silver alginate to this last week. Prior to that we used Oasis x3 still not able to get this area granulating. He does not have a probing area of the bone which is an improvement from when I spur started working on this and an MRI did not suggest osteomyelitis. I do not see evidence of infection here but I am increasingly concerned about why I cannot get this area to granulate. Each time I debrided this is looks like this is simply a matter of getting granulation to fill in the hole and then getting epithelialization. This does not seem to happen Also I sent him back to see podiatry Dr. Earleen Newport about the what felt to be bony outgrowth on the tip of his left great toe. Apparently after heel he left the clinic last week or the next day he developed a blood blister. He did see Dr. Earleen Newport. He went on to have a debridement of the medial nail cuticle he now has an open area here as well as some denuded skin. They did an x-ray apparently does have a bony outgrowth or spur but I am not able to look at this. They are using topical antibiotics apparently there was some suggested he use Santyl. Patient's wife was anxious for my opinion of this 08/20/2018; we are able to keep the wound open this time instead of the thick subcutaneous  tissue closing over the top of it however unfortunately once again this probes to bone. I do not see any evidence of infection and previous MRI did not show osteomyelitis. I elected  to go back to the Oasis to see if we can stimulate some granulation. Last saw his vascular interventional cardiologist Dr. Andree Elk at the beginning of August and he had a repeat procedure. Nevertheless I wonder how much blood flow he has down to this area With regards to the left first toe he is seeing Dr. Earleen Newport next week. They are applying Bactroban to this area. 08/27/2018 ooOnce again he comes in with thick eschar and subcutaneous tissue over the top of the small probing hole. This does not appear to go down to bone but it still has roughly the same depth. I reapplied Oasis today ooOver the left great toe there appears to be more of the wound at the tip of his toe than there was last week. This has an eschar on the surface of it. Will change to Santyl. There is seeing podiatry this afternoon 09/03/2018 ooHe comes in today with the area on the transmetatarsal's site with a fair amount of callus, nonviable tissue over the circumference but it was not closed. I removed all of this as well as some subcutaneous debris and reapplied Oasis. There is no exposed bone ooThe area over the tip of the left great toe started off as a nodule of uncertain etiology. He has been followed with podiatry. They have been removing part of the medial nail bed. He has nonviable tissue over the wound he been using Santyl in this area 09/10/18 ooUnfortunately comes in with neither wound area looking improved. The transmetatarsal amputation site once Again has nonviable debris over the surface requiring debridement. Unfortunately underneath this there is nothing that looks viable and this once again goes right down to bone. There is no purulent drainage and no erythema. ooAlso the surgical wound from podiatry on the left first toe has an ischemic-looking eschar over the surface of the tip of the toe I have elected not to attempt candidly debride this ooHis wife as arranged for him to have follow-up noninvasive  studies in [place] under the care of Dr. Andree Elk clinic. The question is he has known severe PAD. They had recently seen him in August. He has had revascularizations in both legs within the last 4 or 5 months. He is not complaining of pain and I cannot really get a history of claudication. He has not systemically unwell 09/20/2018 the patient has been to [facility] and been revascularized by Dr. Andree Elk earlier this week. Apparently he was able to open up the anterior tibial artery although I have not actually seen his formal report. He is going for an attempt to revascularize on the left on Monday. He is apparently working with an investigational stent for lower extremity arteries below the knee and he has talked to the patient about placing that on Monday if possible. The patient has been using silver alginate on the transmetatarsal amputation site and Santyl on the left 09/30/2018; patient had his revascularization on the left this apparently included a standard approach as well as a more distal arterial catheterization although I do not have any information on this from Dr. Andree Elk. In fact I do not even see the initial revascularization that he had on the right. I have included the arterial history from Dr. Andree Elk last note however below; ASSESSMENT/PLAN: 1. Hx of Critical limb ischemia bilateral lower  extremities, PAD: -s/p transmetatarsal amputation of the RLE. -s/p right ATA percutaneous revascularization procedure x2, most recently 11/20/2016 s/p PTA of right AT 100% to less than 20% with a 2.5 x 200 balloon. 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 -03/21/2018 s/p PTA of 90% left popliteal artery to <10% with a 5x20 cutting balloon, PTA of 90% left peroneal to <20% with a 3x20 balloon, PTA of 100% left AT to <20% with a 2.5x220 balloon. -Considering he has  bilateral lower extremity CLI on the right foot and L great toe, we will plan on abdominal aortogram focusing on the right lower extremity via left common femoral access. Will plan on the LLE soon after. Risks/benefits of procedure have been discussed and patient has elected to proceed. We have been using endoform to the right TMA amputation site wound and Santyl to the left great toe 10/07/2018; the area on the tip of his left great toe looked better we have been using Santyl here. We continue to have a very difficult probing hole on the right TMA amputation site we have been using endoform. I went on to use his fifth Oasis today 10/14/2018; the tip of the left great toe continues to look better. We have been using Santyl here the surface however is healthy and I think we can change to an alginate. The right TMA has not changed. Once again he has no superficial opening there is callus and thick subcutaneous tissue over the orifice once you remove this there is the probing area that we have been dealing with without too much change. There is no palpable bone I have been placing Oasis here and put Oasis #6 in this today after a more vigorous debridement 10/21/18; the left great toe still has necrotic surface requiring debridement. The right TMA site hasn't changed in view of the thick callus over the wound bed. With removal of this there is still the opening however this does not appear to have the same depth. Again there is no palpable bone. Oasis was replaced 10/28/2018; patient comes in with both wounds looking worse. The area over the first toe tip is now down to bone. The area over the TMA site is deeper and down to bone clearly with a increase in overall wound area. Equally concerning on the right TMA is the complete absence of a pulse this week which is a change. We are not even able to Doppler this. On the right he has a noncompressible ABI greater than 1.4 11/04/2018. Both wounds look  somewhat worse. X-rays showed no osteomyelitis of the right foot but on the left there was underlying osteomyelitis in the left great toe distal phalanx. I been on the phone to Dr. Andree Elk surface at [facility] in [place] and they are arranging for another angiogram on the right on Monday. I have him on doxycycline for the osteomyelitis in the left great toe for now. Infectious disease may be necessary 1/17; 2-week hiatus. Patient was admitted to hospital at [place]. My understanding is he underwent an angioplasty of the right anterior tibial artery and had stents placed in the left anterior artery and the left tibial peroneal trunk. This was done by Dr. Andree Elk of interventional radiology. There is no major change in either 1 of the wounds. They have been using Aquacel Ag. As far as they are aware no imaging studies were done of  the foot which is indeed unfortunate. I had him on doxycycline for 2 weeks since we identified the osteomyelitis in the left great toe by plain x-ray. I have renewed that again today. I am still suspicious about osteomyelitis in the amputation site and would consider doing another MRI to compare with the one done in September. The idea of hyperbaric oxygen certainly comes up for discussion 1/24; no major change in either wound area. I have him on doxycycline for osteomyelitis at the tip of the left great toe. As noted he has been previously and recently revascularized by Dr. Andree Elk at [place]. He is tolerating the doxycycline well. For some reason we do not have an infectious disease consult yet. Culture of drainage from the right foot site last week was negative 1/31; MRI of the right foot did not show osteomyelitis of the right ankle and foot. Notable for a skin ulceration overlying the second metatarsal stump with generalizing soft tissue edema of the ankle and foot consistent with cellulitis. Noted to have a partial-thickness tear of the Achilles tendon 7.5 cm proximal to the  insertion Nothing really new in terms of symptoms. Patient's area on the tip of the left great toe is just about closed although he still has the probing area on the metatarsal amputation site. Appointment with Dr. Linus Salmons of infectious disease next week 2/7; Dr. Novella Olive did not feel that any further antibiotics were necessary he would follow-up in 2 months. The left great toe appears to be closed still some surface callus that I gently looked under high did not see anything open or anything that was threatening to be open. He still has the open area on the mid part of his TMA site using endoform 2/14; left great toe is closed and he is completing his doxycycline as of last Sunday. He is not on any antibiotics. Unfortunately out of the right foot his wife noticed some subdermal hemorrhage this week. He had been walking 2 miles I had given him permission to do so this is not really a plantar wound. Using endoform to this wound but I changed to silver alginate this week 2/21; left great toe remains closed. Culture last week grew Streptococcus angiosis which I am not really familiar with however it is penicillin sensitive and I am going to put him on Augmentin. Not much change in the wound on the right foot the deep area is still probing precariously close to bone and the wound on the margin of the TMA is larger. 2/28; left great toe remains closed. He is completing the Augmentin I gave him last week. 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 This is being shown to Dr. Andree Elk at [place] to see if there is anything else that can be done here. He has been using silver alginate strips on the right 3/6; left great toe remains closed. He sees Dr. Andree Elk on Monday. The area on the plantar aspect of the right foot has the same small orifice with thick callused tissue around this. However this time with removal of the callus tissue the wound is open all the way along the incision  line to the end medially. We have been using silver alginate. 3/13; left toe remains closed although the area is callused. He sees Dr. Jacqualyn Posey of podiatry next week. The area on the plantar right foot looked a lot better this week. Culture I did of this was negative we use silver alginate. He is going next  week for an attempt at revascularization by Dr. Andree Elk 3/23; left toe remains closed although the area is callused. He will see Dr. Jacqualyn Posey in follow-up. The area on the right plantar foot continues to look surprisingly better over the last 3 visits. We have been using silver alginate. The revascularization he was supposed to have by Dr. Andree Elk at [facility] in [place] has been canceled [employer] procedure] 4/6; the right foot remains closed albeit callused. Podiatry canceled the appointment with regards to the left great toe. He has not seen Dr. Andree Elk at [facility] but thinks that Dr. Andree Elk has "done all he can do". He would be a candidate for the hemostaemix trial Readmission 07/29/2019 Mr. Stann Mainland is a man we know well from at least 3 previous stays in this clinic. He is a type II diabetic with severe PAD followed by Dr. Andree Elk at [facility] in [place]. 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 So far everything in both of these areas has remained closed. About 2 weeks ago he went to see his podiatrist at [facility] Dr. Babs Bertin. He had a thick callus on the left fifth metatarsal head that was shaved. He has developed an open wound in this area. They have been offloading this in his diabetic shoes. The patient has not had any more revascularizations by Dr. Andree Elk since the last time he was here. ABI in our clinic at the posterior tibial on the left was 1.06 10/6; no real change in the area on the plantar met head. Small wound with 2 mm of depth. He has thick  skin probably from pressure around the wound. We have been using silver alginate 10/13; small wound in the left fifth plantar met head. Arrives today with undermining laterally and purulent drainage. Our intake nurse cultured this. His wife stated they noticed a change in color over the last day or 2. He is not systemically unwell 10/19; small wound on the fifth plantar metatarsal head. Culture I did last week showed Staphylococcus lugdunensis. Although this could be a skin contaminant the possibility of a skin and soft tissue infection was there. I did give him empiric doxycycline which should have covered this. We are using silver alginate to the wound. We put him in a total contact cast today. 10/22; small wound on the fifth plantar metatarsal head. He has completed antibiotics. He also has severe PAD which worries me about just about any wound on this man's foot 10/29; small superficial area on the fifth plantar metatarsal head. Measuring slightly smaller. He is not currently on any antibiotics. I been using a total contact cast in this man with very severe PAD but he seems to be tolerating this well 11/5; left plantar fifth metatarsal head. Silver alginate being used under a total contact cast 11/12; wound not much different than last week. Using a #15 scalpel debridement around the wound. Still silver collagen under a total contact cast. He has severe PAD and that may be playing a role in this 11/19; disappointing that the wound is not really changed that much. I think it is come down in overall surface area because originally this was on the lateral part of the fifth metatarsal head however recently it is not really changed. Most of this is filled in but it will not epithelialized there is surface debris on  this which may be mostly related to ischemia. They have an appointment with Dr. Andree Elk on 12/9 09/24/2019 on evaluation today patient appears to be doing somewhat better with regard to  the wound on the left fifth metatarsal head. Fortunately there does not appear to be any signs of active infection at this time. No fevers, chills, nausea, vomiting, or diarrhea. The wound does not appear to be completely closed and I do feel like the Hydrofera Blue is helping to some degree although I feel like the cast as well as keeping things from breaking down or getting any larger at least. As far as his wound overview it shows that the overall size of the wound is slightly smaller but really maintaining within the realm of about the same. He had no troubles with the cast which is good news. 12/1; left fifth metatarsal head. Perhaps somewhat more vibrant. We have been using Hydrofera Blue. He sees Dr. Andree Elk at [place] 1 week tomorrow. He be back next week and I hope to have a better idea which way the wound is going however I will not cast him next week in case Dr. Andree Elk wants to reevaluate things in his foot. The left leg is the leg with the stent in place and I wonder whether these are going to have to be reevaluated. 12/8; left fifth metatarsal head. Quite a bit better this week. Only a small open area remains. He sees Dr. Andree Elk at [place] tomorrow. Dr. Andree Elk is previously done his vascular interventions. He has 2 stents in the left leg as I remember things. I therefore will not put him in a total contact cast today. He will be using a forefoot off loader. We will use silver collagen on the wound. We will see him again next week 12/15; left fifth metatarsal head. He saw Dr. Andree Elk and is scheduled for an angiogram on Thursday. Unfortunately although his wound was a lot better last week it is really deteriorated this week. Small punched-out hole with depth and overhanging tissue. We have been using Hydrofera Blue 12/22; patient had an 80% stenosis proximal to the stent I believe in the below-knee popliteal artery. This was opened by Dr. Andree Elk. According the patient the stent itself was  satisfactory. I have not been able to review this note. 12/29; patient arrives today with the wound about the same size some undermining medially. We have been using Hydrofera Blue under a total contact cast and that is what we will do again today 11/04/2019. Slightly smaller in orifice but about 4 mm undermining almost circumferentially. We have been using Hydrofera Blue. Apparently the Hydrofera Blue did not stay on the wound after we removed the cast. Some minor looking cast irritation on the right lateral 1/12; unfortunately things did not go well this week. Arrives in clinic today with a deeper wound with undermining more concerning a area of pus. Specimen was obtained for culture. We are not going to be able to put him in a cast today. 1/19; purulent material last week which I cultured showed Enterobacter cloacae. He was on ampicillin previously prescribed by his primary doctor which should not have covered this however mysteriously the wound looks somewhat better. There is less depth less erythema certainly no drainage. He will need a course of ciprofloxacin which I will provide today. Objective Constitutional Patient is hypertensive.. Pulse regular and within target range for patient.Marland Kitchen Respirations regular, non-labored and within target range.. Temperature is normal and within the target range for the  patient.Marland Kitchen Appears in no distress. Vitals Time Taken: 12:35 PM, Height: 69 in, Weight: 210 lbs, BMI: 31, Temperature: 98.6 F, Pulse: 77 bpm, Respiratory Rate: 20 breaths/min, Blood Pressure: 147/78 mmHg, Capillary Blood Glucose: 150 mg/dl. Cardiovascular I did not feel his dorsalis pedis pulse on the left. General Notes: Wound exam; no purulent drainage this week. No erythema around the wound there is some skin that I removed with pickups and scissors although this was not in the wound area. There is no tenderness. No tenderness of the MTP Integumentary (Hair, Skin) Wound #5 status is  Open. Original cause of wound was Gradually Appeared. The wound is located on the Left Metatarsal head fifth. The wound measures 0.6cm length x 0.6cm width x 0.2cm depth; 0.283cm^2 area and 0.057cm^3 volume. There is Fat Layer (Subcutaneous Tissue) Exposed exposed. There is no tunneling or undermining noted. There is a medium amount of serosanguineous drainage noted. The wound margin is well defined and not attached to the wound base. There is large (67-100%) red granulation within the wound bed. There is a small (1-33%) amount of necrotic tissue within the wound bed including Adherent Slough. Assessment Active Problems ICD-10 Type 2 diabetes mellitus with foot ulcer Type 2 diabetes mellitus with diabetic peripheral angiopathy without gangrene Non-pressure chronic ulcer of other part of left foot limited to breakdown of skin Cellulitis of left lower limb Plan Follow-up Appointments: Return Appointment in 1 week. Dressing Change Frequency: Wound #5 Left Metatarsal head fifth: Change dressing every day. Wound Cleansing: Wound #5 Left Metatarsal head fifth: May shower and wash wound with soap and water. Primary Wound Dressing: Wound #5 Left Metatarsal head fifth: Calcium Alginate with Silver Other: - pad right great toe for protection Secondary Dressing: Kerlix/Rolled Gauze Dry Gauze Off-Loading: Other: - Fore foot off loader The following medication(s) was prescribed: cefdinir oral 300 mg capsule 1capsule oral bid for 7 days starting 11/18/2019 1. Purulent drainage from last week showed Enterobacter cloacae I. This should not have been sensitive to the amoxicillin he was on as it was resistant to cefazolin. Paradoxically the wound actually looks somewhat better. Less erythema and less depth. Nevertheless I felt the patient deserves a 1 week course of cefdinir to cover this particular gram-negative. 2. I gave him a Darco forefoot offloading shoe. 3. No contact cast for this  week Electronic Signature(s) Signed: 11/18/2019 1:14:42 PM By: [name] MD Entered By: [name] on 11/18/2019 13:14:41 -------------------------------------------------------------------------------- SuperBill Details Patient Name: Date of Service: [name], [name] 11/18/2019 Medical Record [id number] Patient Account Number: [id number] Date of Birth/Sex: Treating RN: October 23, 1946 (73 y.o. [name]) [name] Primary Care Provider: [name] Other Clinician: Referring Provider: Treating Provider/Extender:Gabriela Giannelli, [name], [name] in Treatment: 16 Diagnosis Coding ICD-10 Codes Code Description E11.621 Type 2 diabetes mellitus with foot ulcer E11.51 Type 2 diabetes mellitus with diabetic peripheral angiopathy without gangrene L97.521 Non-pressure chronic ulcer of other part of left foot limited to breakdown of skin L03.116 Cellulitis of left lower limb Facility Procedures CPT4 Code: [id number] Description: 99213 - WOUND CARE VISIT-LEV 3 EST PT Modifier: Quantity: 1 Physician Procedures CPT4 Code Description: [id number] - WC PHYS LEVEL 3 - EST PT ICD-10 Diagnosis Description E11.621 Type 2 diabetes mellitus with foot ulcer L97.521 Non-pressure chronic ulcer of other part of left foot limit Modifier: ed to breakdo Quantity: 1 wn of skin Electronic Signature(s) Signed: 11/18/2019 5:52:07 PM By: [name] MD Entered By: [name] on 11/18/2019 13:15:16

## 2019-11-19 NOTE — Progress Notes (Addendum)
[name], [name] ([id number]) Visit Report for 11/18/2019 Arrival Information Details Patient Name: Date of Service: [name], [name] 11/18/2019 12:30 PM Medical Record [id number] Patient Account Number: [id number] Date of Birth/Sex: Treating RN: 1945/11/30 (74 y.o. [name], [name] Primary Care Emmalyn Hinson: [name] Other Clinician: Referring Guillermina Shaft: Treating Aeryn Medici/Extender:[name], [name], [name] in Treatment: 16 Visit Information History Since Last Visit Added or deleted any medications: No Patient Arrived: Ambulatory Any new allergies or adverse reactions: No Arrival Time: 12:30 Had a fall or experienced change in No Accompanied By: wife activities of daily living that may affect Transfer Assistance: None risk of falls: Patient Identification Verified: Yes Signs or symptoms of abuse/neglect since last No Secondary Verification Process Completed: Yes visito Patient Requires Transmission-Based No Hospitalized since last visit: No Precautions: Implantable device outside of the clinic excluding No Patient Has Alerts: No cellular tissue based products placed in the center since last visit: Has Dressing in Place as Prescribed: Yes Pain Present Now: No Electronic Signature(s) Signed: 11/18/2019 5:39:51 PM By: Shawn Stall Entered By: Shawn Stall on 11/18/2019 12:34:47 -------------------------------------------------------------------------------- Clinic Level of Care Assessment Details Patient Name: Date of Service: [name], [name] 11/18/2019 12:30 PM Medical Record [id number] Patient Account Number: [id number] Date of Birth/Sex: Treating RN: 18-Jan-1946 (73 y.o. [name] [name] Primary Care Theoden Mauch: [name] Other Clinician: Referring Alazae Crymes: Treating Roxane Puerto/Extender:[name], [name], [name] in Treatment: 16 Clinic Level of Care Assessment Items TOOL 4 Quantity Score X - Use when only an EandM is performed on FOLLOW-UP  visit 1 0 ASSESSMENTS - Nursing Assessment / Reassessment X - Reassessment of Co-morbidities (includes updates in patient status) 1 10 X - Reassessment of Adherence to Treatment Plan 1 5 ASSESSMENTS - Wound and Skin Assessment / Reassessment X - Simple Wound Assessment / Reassessment - one wound 1 5 []  - Complex Wound Assessment / Reassessment - multiple wounds 0 []  - Dermatologic / Skin Assessment (not related to wound area) 0 ASSESSMENTS - Focused Assessment []  - Circumferential Edema Measurements - multi extremities 0 []  - Nutritional Assessment / Counseling / Intervention 0 []  - Lower Extremity Assessment (monofilament, tuning fork, pulses) 0 []  - Peripheral Arterial Disease Assessment (using hand held doppler) 0 ASSESSMENTS - Ostomy and/or Continence Assessment and Care []  - Incontinence Assessment and Management 0 []  - Ostomy Care Assessment and Management (repouching, etc.) 0 PROCESS - Coordination of Care X - Simple Patient / Family Education for ongoing care 1 15 []  - Complex (extensive) Patient / Family Education for ongoing care 0 X - Staff obtains , Records, Test Results / Process Orders 1 10 []  - Staff telephones HHA, Nursing Homes / Clarify orders / etc 0 []  - Routine Transfer to another Facility (non-emergent condition) 0 []  - Routine Hospital Admission (non-emergent condition) 0 []  - New Admissions / / Ordering NPWT, Apligraf, etc. 0 []  - Emergency Hospital Admission (emergent condition) 0 X - Simple Discharge Coordination 1 10 []  - Complex (extensive) Discharge Coordination 0 PROCESS - Special Needs []  - Pediatric / Minor Patient Management 0 []  - Isolation Patient Management 0 []  - Hearing / Language / Visual special needs 0 []  - Assessment of Community assistance (transportation, D/C planning, etc.) 0 []  - Additional assistance / Altered mentation 0 []  - Support Surface(s) Assessment (bed, cushion, seat, etc.) 0 INTERVENTIONS -  Wound Cleansing / Measurement X - Simple Wound Cleansing - one wound 1 5 []  - Complex Wound Cleansing - multiple wounds 0 X - Wound Imaging (photographs - any number of  wounds) 1 5 []  - Wound Tracing (instead of photographs) 0 X - Simple Wound Measurement - one wound 1 5 []  - Complex Wound Measurement - multiple wounds 0 INTERVENTIONS - Wound Dressings []  - Small Wound Dressing one or multiple wounds 0 X - Medium Wound Dressing one or multiple wounds 1 15 []  - Large Wound Dressing one or multiple wounds 0 X - Application of Medications - topical 1 5 []  - Application of Medications - injection 0 INTERVENTIONS - Miscellaneous []  - External ear exam 0 []  - Specimen Collection (cultures, biopsies, blood, body fluids, etc.) 0 []  - Specimen(s) / Culture(s) sent or taken to Lab for analysis 0 []  - Patient Transfer (multiple staff / Civil Service fast streamer / Similar devices) 0 []  - Simple Staple / Suture removal (25 or less) 0 []  - Complex Staple / Suture removal (26 or more) 0 []  - Hypo / Hyperglycemic Management (close monitor of Blood Glucose) 0 []  - Ankle / Brachial Index (ABI) - do not check if billed separately 0 X - Vital Signs 1 5 Has the patient been seen at the hospital within the last three years: Yes Total Score: 95 Level Of Care: New/Established - Level 3 Electronic Signature(s) Signed: 11/19/2019 5:42:11 PM By: [name] RN Entered By: [name] on 11/18/2019 13:04:57 -------------------------------------------------------------------------------- Encounter Discharge Information Details Patient Name: Date of Service: [name], [name] 11/18/2019 12:30 PM Medical Record [id number] Patient Account Number: [id number] Date of Birth/Sex: Treating RN: 10-Jul-1946 (74 y.o. [name] Primary Care Vyom Brass: [name] Other Clinician: Referring Laureen Frederic: Treating Emanuelle Bastos/Extender:[name], [name], [name] in Treatment: 16 Encounter Discharge Information  Items Discharge Condition: Stable Ambulatory Status: Ambulatory Discharge Destination: Home Transportation: Private Auto Accompanied By: wife Schedule Follow-up Appointment: Yes Clinical Summary of Care: Patient Declined Electronic Signature(s) Signed: 11/18/2019 5:45:51 PM By: Kela Millin Entered By: Kela Millin on 11/18/2019 13:15:44 -------------------------------------------------------------------------------- Lower Extremity Assessment Details Patient Name: Date of Service: [name], [name] 11/18/2019 12:30 PM Medical Record [id number] Patient Account Number: [id number] Date of Birth/Sex: Treating RN: [birth year] (73 y.o. [name] Primary Care Avaleigh Decuir: [name] Other Clinician: Referring Aivy Akter: Treating Jamail Cullers/Extender:[name], [name], [name] in Treatment: 16 Edema Assessment Assessed: Shirlyn Goltz: Yes] [Right: No] Edema: [Left: Ye] [Right: s] Calf Left: Right: Point of Measurement: cm From Medial Instep 39.5 cm cm Ankle Left: Right: Point of Measurement: cm From Medial Instep 29.5 cm cm Vascular Assessment Pulses: Dorsalis Pedis Palpable: [Left:Yes] Electronic Signature(s) Signed: 11/18/2019 5:39:51 PM By: Deon Pilling Entered By: Deon Pilling on 11/18/2019 12:37:28 -------------------------------------------------------------------------------- Multi Wound Chart Details Patient Name: Date of Service: [name], [name] 11/18/2019 12:30 PM Medical Record [id number] Patient Account Number: [id number] Date of Birth/Sex: Treating RN: [birth year] (74 y.o. 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] Comorbid History: [5:Cataracts, Chronic sinus N/A problems/congestion, Coronary Artery Disease, Hypertension, Peripheral Arterial Disease, Type II Diabetes, Osteoarthritis, Neuropathy] Date Acquired: [5:07/14/2019] [N/A:N/A] Weeks of Treatment: [5:16] [N/A:N/A] Wound Status: [5:Open] [N/A:N/A] Measurements L x W x D 0.6x0.6x0.2 [N/A:N/A] (cm) Area (cm) : [5:0.283] [N/A:N/A] Volume (cm) : [5:0.057] [N/A:N/A] % Reduction in Area: [5:-349.20%] [N/A:N/A] % Reduction in Volume: -338.50% [N/A:N/A] Classification: [5:Grade 1] [N/A:N/A] Exudate  Amount: [5:Medium] [N/A:N/A] Exudate Type: [5:Serosanguineous] [N/A:N/A] Exudate Color: [5:red, brown] [N/A:N/A] Wound Margin: [5:Well defined, not attached N/A] Granulation Amount: [5:Large (67-100%)] [N/A:N/A] Granulation Quality: [5:Red] [N/A:N/A] Necrotic Amount: [5:Small (1-33%)] [N/A:N/A] Exposed Structures: [5:Fat Layer (Subcutaneous Tissue) Exposed: Yes Fascia: No Tendon: No Muscle: No Joint: No Bone: No Small (1-33%)] [N/A:N/A N/A] Treatment Notes Electronic Signature(s) Signed: 11/18/2019 5:52:07 PM By: Baltazar Najjar MD Entered By: Baltazar Najjar on 11/18/2019 13:03:57 -------------------------------------------------------------------------------- Multi-Disciplinary Care Plan Details Patient Name: Date of Service: [name], [name] 11/18/2019 12:30 PM Medical Record [id number] Patient Account Number: [id number] Date of Birth/Sex: Treating RN: 12/16/1945 (73 y.o. [name] Primary Care Kenidee Cregan: [name] Other Clinician: Referring Juniel Groene: Treating Jamis Kryder/Extender:[name], [name], [name] in Treatment: 16 Active Inactive Wound/Skin Impairment Nursing Diagnoses: Knowledge deficit  related to ulceration/compromised skin integrity Goals: Patient/caregiver will verbalize understanding of skin care regimen Date Initiated: 07/29/2019 Target Resolution Date: 12/05/2019 Goal Status: Active Ulcer/skin breakdown will have a volume reduction of 30% by week 4 Target Resolution Date Initiated: 07/29/2019 Date Inactivated: 10/21/2019 Date: 10/10/2019 Unmet Reason: comorbities. Goal Status: Unmet blood flow Ulcer/skin breakdown will have a volume reduction of 50% by week 8 Date Initiated: 10/21/2019 Target Resolution Date: 11/21/2019 Goal Status: Active Interventions: Assess patient/caregiver ability to obtain necessary supplies Assess patient/caregiver ability to perform ulcer/skin care regimen upon admission and as needed Assess ulceration(s) every visit Notes: Electronic Signature(s) Signed: 11/19/2019 5:42:11 PM By: [name] RN Entered By: [name] on 11/18/2019 12:39:18 -------------------------------------------------------------------------------- Pain Assessment Details Patient Name: Date of Service: [name], [name] 11/18/2019 12:30 PM Medical Record [id number] Patient Account Number: [id number] Date of Birth/Sex: Treating RN: 03/10/1946 (74 y.o. [name] Primary Care Billey Wojciak: [name] Other Clinician: Referring Dawit Tankard: Treating Jimel Myler/Extender:[name], [name], [name] in Treatment: 16 Active Problems Location of Pain Severity and Description of Pain Patient Has Paino No Site Locations Rate the pain. Current Pain Level: 0 Pain Management and Medication Current Pain Management: Medication: No Cold Application: No Rest: No Massage: No Activity: No T.E.N.S.: No Heat Application: No Leg drop or elevation: No Is the Current Pain Management Adequate: Adequate How does your wound impact your activities of daily livingo Sleep: No Bathing: No Appetite: No Relationship With Others: No Bladder Continence:  No Emotions: No Bowel Continence: No Work: No Toileting: No Drive: No Dressing: No Hobbies: No Electronic Signature(s) Signed: 11/18/2019 5:39:51 PM By: Shawn Stall Entered By: Shawn Stall on 11/18/2019 12:36:06 -------------------------------------------------------------------------------- Patient/Caregiver Education Details Patient Name: Date of Service: [name] 1/19/2021andnbsp12:30 PM Medical Record Patient Account Number: [id number] Number: Treating RN: [name] Date of Birth/Gender: Jan 05, 1946 (73 y.o. Other Clinician: M) Treating Baltazar Najjar Primary Care Physician: [name] Physician/Extender: Referring Physician: Joaquin Bend in Treatment: 16 Education Assessment Education Provided To: Patient Education Topics Provided Wound/Skin Impairment: Methods: Explain/Verbal Responses: State content correctly [employer]) Signed: 11/19/2019 5:42:11 PM By: [name] RN Entered By: [name] on 11/18/2019 12:39:33 -------------------------------------------------------------------------------- Wound Assessment Details Patient Name: Date of Service: [name], [name] 11/18/2019 12:30 PM Medical Record [id number] Patient Account Number: [id number] Date of Birth/Sex: Treating RN: 1946-09-10 (73 y.o. [name] Primary Care Vannary Greening: [name] Other Clinician: Referring Philomene Haff: Treating Jadda Hunsucker/Extender:[name], [name], [name] in Treatment: 16 Wound Status Wound Number: 5 Primary Diabetic Wound/Ulcer of the Lower Extremity Etiology: Wound Location: Left Metatarsal head fifth Wound Open Wounding Event: Gradually Appeared Status: Date Acquired: 07/14/2019 Comorbid Cataracts, Chronic sinus problems/congestion, Weeks Of Treatment: 16 History: Coronary Artery Disease, Hypertension, Clustered Wound: No Peripheral Arterial Disease,  Type II Diabetes, Osteoarthritis, Neuropathy Photos Wound  Measurements Length: (cm) 0.6 % Redu Width: (cm) 0.6 % Redu Depth: (cm) 0.2 Epithe Area: (cm) 0.283 Tunne Volume: (cm) 0.057 Under Wound Description Classification: Grade 1 Wound Margin: Well defined, not attached Exudate Amount: Medium Exudate Type: Serosanguineous Exudate Color: red, brown Wound Bed Granulation Amount: Large (67-100%) Granulation Quality: Red Necrotic Amount: Small (1-33%) Necrotic Quality: Adherent Slough Foul Odor After Cleansing: No Slough/Fibrino Yes Exposed Structure Fascia Exposed: No Fat Layer (Subcutaneous Tissue) Exposed: Yes Tendon Exposed: No Muscle Exposed: No Joint Exposed: No Bone Exposed: No ction in Area: -349.2% ction in Volume: -338.5% lialization: Small (1-33%) ling: No mining: No Treatment Notes Wound #5 (Left Metatarsal head fifth) 1. Cleanse With Wound Cleanser 2. Periwound Care Skin Prep 3. Primary Dressing Applied Calcium Alginate Ag 4. Secondary Dressing Foam 7. Footwear/Offloading device applied Other footwear/offloading device (specify in notes) Notes front Research scientist (medical)) Signed: 11/20/2019 4:35:12 PM By: Benjaman Kindler EMT/HBOT Signed: 11/20/2019 6:20:01 PM By: Shawn Stall Previous Signature: 11/18/2019 5:39:51 PM Version By: Shawn Stall Entered By: Benjaman Kindler on 11/20/2019 11:31:44 -------------------------------------------------------------------------------- Vitals Details Patient Name: Date of Service: JAMEIRE, KOUBA 11/18/2019 12:30 PM Medical Record [id number] Patient Account Number: [id number] Date of Birth/Sex: Treating RN: [birth year] (74 y.o. [name], [name] Primary Care Sherronda Sweigert: [name] Other Clinician: Referring Shiana Rappleye: Treating August Gosser/Extender:[name], [name], [name] in Treatment: 16 Vital Signs Time Taken: 12:35 Temperature (F): 98.6 Height (in): 69 Pulse (bpm): 77 Weight (lbs): 210 Respiratory Rate (breaths/min): 20 Body Mass Index  (BMI): 31 Blood Pressure (mmHg): 147/78 Capillary Blood Glucose (mg/dl): 222 Reference Range: 80 - 120 mg / dl Electronic Signature(s) Signed: 11/18/2019 5:39:51 PM By: Shawn Stall Entered By: Shawn Stall on 11/18/2019 12:35:54

## 2019-11-25 ENCOUNTER — Encounter (HOSPITAL_BASED_OUTPATIENT_CLINIC_OR_DEPARTMENT_OTHER): Payer: Medicare Other | Admitting: Internal Medicine

## 2019-11-25 ENCOUNTER — Other Ambulatory Visit: Payer: Self-pay

## 2019-11-25 DIAGNOSIS — E11621 Type 2 diabetes mellitus with foot ulcer: Secondary | ICD-10-CM | POA: Diagnosis not present

## 2019-11-26 NOTE — Progress Notes (Signed)
[name], [name] ([id number]) Visit Report for 11/25/2019 HPI Details Patient Name: Date of Service: [name], [name] 11/25/2019 12:30 PM Medical Record [id number] Patient Account Number: [id number] Date of Birth/Sex: Treating RN: [id number] (74 y.o. M) Primary Care Provider: [name] Other Clinician: Referring Provider: Treating Provider/Extender:Delva Derden, [name], [name] in Treatment: 17 History of Present Illness HPI Description: 05/18/16; this is a 74year-old diabetic who is a type II diabetic on insulin. The history is that he traumatized his right foot developed a sore sometime in late March. 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 According to the patient and his wife this wound never really healed. He was seen on 2 occasions in the wound care center in [place] and had vascular studies and then was referred urgently to Dr. Bridgett Larsson of vascular surgery. He underwent an angiogram on 05/10/16. Unfortunately nothing really could be done to improve his vascular status. He had a 75-90% stenosis in the midsegment of 1 segment of the posterior femoral artery. He had a patent popliteal, his anterior tibial occluded shortly after takeoff. Perineal had a greater than 90% stenosis posterior tibial is occluded feet had no distal collaterals feed distal aspect of the transmetatarsal amputation site. The patient tells me that he had a prolonged period of Santyl by Dr. Doran Durand was some initial improvement but then this was stopped. I think they're only applying daily dressings/dry dressings. He has not had a recent x-ray of the right foot he did have one before his surgery in April. His wife by the dimensions of the wound/surgical site being followed at home since  4/20. At that point the dimensions were 0.5 x 12 x 0.2 on 7/19 this was 1.8 x 6 x 0.4. He is not currently on any antibiotics. His hemoglobin A1c in early April was 12.9 at that point he was started on insulin. Apparently his blood sugars are much lower he has an appointment with Dr. Legrand Como Alteimer of endocrine next week. 05/29/16 x-ray of the area did not show osteomyelitis. I think he probably needs an MRI at this point. His wife is asking about something called"Yireh" cream which is not FDA approved. I have not heard of this. 06/22/16; MRI did not really suggest osteomyelitis. There was minimal marrow edema and enhancement in the stump of the second metatarsal felt to be secondary likely to postoperative change rather than osteomyelitis. The patient has arranged his own consultation with Dr. Andree Elk at [place], apparently their daughter lives in [place] and has some connection here. Any improvement in vascular supply by Dr. Andree Elk would of course be helpful. Dr. Bridgett Larsson did not feel that anything further could be done other than amputation if wound care did not result in healing or if the area deteriorates. 06/26/16; the patient has been to see Dr. Andree Elk at [place] and had an angiogram. He is going for a procedure on Thursday which will involve catheterization. I'm not sure if this is an anterograde or retrograde approach. He has been using Santyl to the wound 07/10/16; the patient had a repeat angiogram and angioplasty at [place] by Dr. Brunetta Jeans. His angiogram showed right CFA and profundal widely patent. The right as of a.m. popliteal artery were widely patent the right anterior tibial was occluded proximally and reconstitutes at the ankle.  Peroneal artery was patent to the foot. Posterior tibial artery was occluded. The patient had angioplasty of the anterior tibial artery.. This was quite successful. He was recommended for Plavix as well as aspirin. 07/17/16; the patient was close to be a nurse visit today  however the outer dressing of the Apligraf fell off. Noted drainage. I was asked to see the wound. The patient is noted an odor however his wife had noted that. Drainage with Apligraf not necessarily a bad thing. He has not been systemically unwell 07/24/16; we are still have an issue with drainage of this wound. In spite of this I applied his second Apligraf. Medially the area still is probing to bone. 08/07/16; Apligraf reapplied in general wound looks improved. 08/21/16 Apligraf #4. Wound looks much better 09/04/16 patientt's wound again today continues to appear to improve with the application of the Apligraf's. He notes no increased discomfort or concerns at this point in time. 09/18/16; the patient returns today 2 weeks after his fifth application of Apligraf. Predictably three quarters of the width of this wound has healed. The deep area that probe to bone medially is still open. The patient asked how much out-of-pocket dollars would be for additional Apligraf's. 09/25/16; now using Hydrofera Blue. He has completed 5 Apligraf applications with considerable improvement in this deep open transmetatarsal amputation site. His wound is now a triangular-shaped wound on the medial aspect. At roughly 12 to 2:00 this probes another centimeter but as opposed to in the past this does not probe to bone. The patient has been seen at [facility] by Dr. Zenia Resides. He is not planning to do any more revascularization unless the wound stalls or worsens per the patient 10/02/16; 0.7 x 0.8 x 0.8. Unfortunately although the wound looks stable to improved. There is now easily probable bone. This hasn't been present for several weeks. Patient is not otherwise symptomatic he is not experiencing any pain. I did a culture of the wound bed 10/09/16. Deterioration last week. Culture grew MRSA and although there is improvement here with doxycycline prescribed over the phone I'm going to try to get him linezolid 600 twice a day for  10 days today. 10/16/16; he is completing a weeks worth of linezolid and still has 3 more days to go. Small triangular-shaped open area with some degree of undermining. There is still palpable bone with a curet. Overall the area appears better than last week 10/20/16 he has completed the linezolid still having some nausea and vomiting but no diarrhea. He has exposed bone this week which is a deterioration. 10/27/16 patient now has a small but probing wound down to bone. Culture of this bone that I did last week showed a few methicillin-resistant staph aureus. I have little doubt that this represents acute/subacute osteomyelitis. The patient is currently on Doxy which I will continue he also completed 10 days of linezolid. We are now in a difficult situation with this patient's foot after considerable discussion we will send him back to see Dr. Doran Durand for a surgical opinion of this I'm also going to try to arrange a infectious disease consult at [facility] hopefully week and get this prior to her usual 4-6 weeks we having [place]. The patient clearly is going to need 6 weeks of IV vancomycin. If we cannot arrange this expediently I'll have to consider ordering this myself through a home infusion company 11/03/16; the patient now has a small in terms of circumference but probing wound. No bone palpable today. The patient remains  on doxycycline 100 twice a day which should support him until he sees infectious disease at [facility] next week the following week on Wednesday I believe he has an appointment with Dr. Andree Elk at [facility] who is his vascular cardiologist. Finally he has an appointment with Dr. Doran Durand on 11/22/16 we have been using silver alginate. The patient's wife states they are having trouble getting this through [facility] 11/13/16; the patient was seen by infectious disease at [facility] in the 11th PICC line placed in preparation for IV antibiotics. A tummy he has not going to get IV vancomycin o  Ceftaroline. They've also ordered an MRI. Patient has a follow-up with Dr. Doran Durand on 11/22/16 and Dr. Andree Elk at [facility] tomorrow 11/23/16 the patient is on daptomycin as directed by infectious disease at [facility]. He is also been back to see Dr. Andree Elk at [facility]. He underwent a repeat arteriogram. He had a successful PTA of the right anterior tibial artery. He is on dual antiplatelete treatment with Plavix and aspirin. Finally he had the MRI of his foot in [place]. This showed cellulitis about the foot worse distally edema and enhancement in the reminiscent of the second metatarsal was consistent with osteomyelitis therefore what I was assuming to be the first metatarsal may be actually the second. He also has a fluid collection deep to the calcaneus at the level of the calcaneal spur which could be an abscess or due to adventitial bursitis. He had a small tear in his Achilles 11/30/16; the patient continues on daptomycin as directed by infectious disease at [facility]. He is been revascularized by Dr. Andree Elk at [place] in [place]. He has been to see Gretta Arab who was the orthopedic surgeon who did his original amputation. I have not seen his not however per the patient's wife he did not offer another surgical local surgical prodecure to remove involved bone. He verbalized his usual disbelief in not just hyperbarics but any medical therapy for this condition(osteomyelitis). I discussed this in detail with the patient today including answering the question about a BKA definitively "curing" the current condition. 12/07/16; the patient continues on daptomycin as directed by infectious disease at [facility]. This is directed at the MRSA that we cultured from his bone debridement from 12/22. Lab work today shows a white count of 8.7 hemoglobin of 10.5 which is microcytic and hypochromic differential count shows a slightly elevated monocyte count at 1.2 eosinophilic count of 0.6. His creatinine is  1.11 sedimentation rate apparently is gone from 35-34 now 40. I explained was wife I don't think this represents a trend. His total CK is 48 12/14/16- patient is here for follow-up evaluation of his right TMA site. He continues to receive IV daptomycin per infectious disease. His serum inflammatory markers remain elevated. He complains of intermittent pain to the medial aspect of the TMA site with intermittent erythema. He voices no complaints or concerns regarding hyperbaric therapy. Overall he and his wife are expressing a frustration and discouragement regarrding the length of time of treatment. 12/21/16; small open wound at roughly the first or second metatarsal metatarsalphalyngeal joint reminiscence of his transmetatarsal amputation site he continues to receive IV daptomycin per infectious disease at [facility]. He will finish these a week tomorrow. He has lab work which I been copied on. His white count is 10.8 hemoglobin 8.9 MCV is low at 75., MCH low at 24.5 platelet count slightly elevated at 626. Differential count shows 70% neutrophils 10% monocytes and 10% eosinophils. His comprehensive metabolic  panel shows a slightly low sodium at 133 albumin low at 3.1 total CK is normal at 42 sedimentation rate is much higher at 82. He has had iron studies that show a serum iron of 15 and iron binding capacity of 238 and iron saturation of 6. This is suggestive of iron deficiency. B12 and folate were normal ferritin at 113 The patient tells me that he is not eating well and he has lost weight. He feels episodically nauseated. He is coughing and gagging on mucus which she thinks is sinusitis. He has an appointment with his primary doctor at 5:00 this afternoon in [facility] 01/02/17; the patient developed a subacute pneumonitis. He was admitted to [facility] after a CT scan showed an extensive interstitial pneumonitis [I have not yet seen this]. 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 He has since been discharged. He is not on oxygen. He feels fatigued and very short of breath with exertion. At [facility] the wound care nurse there felt that his wound was healed. He did complete his daptomycin and has follow-up with infectious disease on Friday. X-rays I did before he went to [facility] still suggested residual osteomyelitis in the anterior aspect of the second must metatarsal head. I'm not sure I would've expected any different. There was no other findings. I actually think I did this because of erythema over the first metatarsal head reminiscent 01/11/17; the patient has eosinophilic pneumonitis. He has been reviewed by pulmonology and given clearance for hyperbarics at least that's what his wife says. I'll need to see if there is note in care everywhere. Apparently the prognosis for improvement of daptomycin induced eosinophilic granulocyte is is 3 months without steroids. In the meantime infectious disease has placed him on doxycycline until the wound is closed. He is still is lost a lot of weight and his blood sugars are running in the mid 60s to low 80s fasting and at all times during the day 01/18/17; he has had adjustments in his insulin apparently his blood sugars in the morning or over 100. He wants to restart his hyperbaric treatment we'll do this at 1:00. 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 I think there is good reason to complete his treatments in order to give him the best chance of maintaining a healed status and these DFU 3 wounds with MRSA infection in the bone 02/15/17; the patient was seen today in conjunction with HBO. He completed hyperbaric oxygen today. The open area on his transmetatarsal site has remained closed. There was an area of erythema when I saw him earlier in  the week on the posterior heel although that is resolved as of today as well. He has been using a cam walker. This is a patient who came to [place] after a transmetatarsal amputation that was necrotic and dehisced. He required revascularization percutaneously on 2 different occasions by Dr. Andree Elk of invasive cardiology at [facility]. 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 He went to [facility] infectious disease and had IV daptomycin for 5 weeks before developing eosinophilic pneumonitis and requiring an admission to hospital/ICU. He made a good recovery and is continued on doxycycline since. As mentioned his foot is closed now. He has a follow-up with Dr. Andree Elk tomorrow. He is going to [employer]  on Monday for a custom-made shoe READMISSION Last visit Dr. Andree Elk [facility] 02/16/17 1. Critical limb ischemia of the RLE: s/p transmetatarsal amputation of the RLE, now completely healed. s/p right ATA percutaneous revascularization procedure x2, most recently 11/20/2016 s/p PTA of right AT 100% to less than 20% with a 2.5 x 200 balloon. He does have some residual osteomyelitis, but given the wound is closed, the orthopedist has recommended to follow. He has a fitting for a special shoe coming up next week. He has completed a course of daptomycin and doxycycline and he has been discharged by ID. He has completed a course of hyperbaric therapy. Continue Continue medical management with aspirin, Plavix and statin therapy. We will discuss ongoing Plavix therapy at follow-up in 6 months. 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 Given no symptoms, we will conservatively manage. He doesn't want to do any more invasive studies at this time, which is reasonable. The patient arrives today  out of 2 concerns both on the right transmetatarsal site. 1 at the level of the reminiscent fifth metatarsal head and the other at roughly the first or second. Both of these look like dark subcutaneous discoloration probably subdermal bleeding. He is recently obtained new adaptive footwear for the right foot. He also has a callus on the left fifth dorsal toe however he follows with podiatry for this and I don't think this is any issue. ABIs in this clinic today were 0.66 on the right and 1.06 on the left. On entrance into our clinic initially this was 0.95 and 0.92. He does not describe current claudication. They're going away on a cruise in 8 weeks and I think are trying to do the month is much as they can proactively. They follow with podiatry and have an appointment with Dr. Andree Elk in October READMISSION 06/25/18 This is a patient that we have not seen in almost a year. He is a type II diabetic with known PAD. He is followed by Dr. Andree Elk of interventional cardiology at [facility] in [place]. He is required revascularization for significant PAD. When we first saw him he required a transmetatarsal amputation. He had underlying osteomyelitis with a nonhealing surgical wound. This eventually closed with wound care, IV antibiotics and hyperbaric oxygen. They tell me that he has a modified shoe and he is very active walking up to 4 miles a day. He is followed by Dr. Geroge Baseman of podiatry. His wife states that he underwent a removal of callus over this site on July 9. She felt there may be drainage from this site after that although she could never really determined and there was callus buildup again. On 06/01/18 there was pressure bleeding through the overlying callus. he was given a prescription for 7 days of Bactrim. Fortuitously he has an appointment with Dr. Andree Elk on 06/04/18 and he immediately underwent revascularization of the right leg although I have not had a chance to review these records  in care everywhere. This was apparently done through anterior and retrograde access. On 06/06/18 he had another debridement by Dr. Bernette Mayers. Vitamin soaking with Epsom salts for 20 minutes and applying calcium alginate. The original trans-met was on April 2017 I believe by Dr. Doran Durand. His ABI in our clinic was noncompressible today. 07/02/18; x-ray I ordered last week was negative for osteomyelitis. Swab culture was also negative. He is going to require an  MRI which I have ordered today. 07/09/18; surprisingly the MRI of the foot that I ordered did not show osteomyelitis. He did suggest the possibility of cellulitis. For this reason I'll go ahead and give him a 10 day course of doxycycline. Although the previous culture of this area was negative 07/16/18; it arrives with the wound looking much the same. Roughly the same depth. He has thick subcutaneous tissue around the wound orifice but this still has roughly the same depth. Been using silver alginate. We applied Oasis #1 today 07/23/2018; still having to remove a lot of callus and thick subcutaneous tissue to actually define the wound here. Most of this seems to have closed down yet he has a comma shaped divot over the top of the area that still I think is open. We applied Oasis #2 His wife expressed concern about the tip of his left great toe. This almost looks like a small blister. She also showed it today to her podiatrist Dr. Geroge Baseman who did not think this was anything serious. I am not sure is anything serious either however I think it bears some watching. He is not in any pain however he is insensate 07/30/2018;; still on a lot of nonviable tissue over the surface of the wound however cleaning this up reveals a more substantial wound orifice but less of a probing wound depth. This is not probed to bone. There is no evidence of infection The wife is still concerned about a non-open area on the tip of his left great toe. Almost feels like a bony  outgrowth. She had previously showed this to podiatry. I do not think this is a blister. A friction area would be possible although he is really not walking according to his wife. He had a small skin tag on his right buttock but no open wound here either 08/06/2018; we applied a third Oasis last week. Unfortunately there is really no improvement. Still requiring extensive debridement to expose the wound bed from a horizontal slitlike depression. I still have not been able to get this to fill in properly. On the positive side there is now no probable bone from when he first came into the facility. I changed him to silver alginate today after a reasonably aggressive debridement 08/13/2018; once again the patient comes in with skin and subcutaneous tissue closing over the small probing area with the underlying cavity of the wound on the right TMA site. I applied silver alginate to this last week. Prior to that we used Oasis x3 still not able to get this area granulating. He does not have a probing area of the bone which is an improvement from when I spur started working on this and an MRI did not suggest osteomyelitis. I do not see evidence of infection here but I am increasingly concerned about why I cannot get this area to granulate. Each time I debrided this is looks like this is simply a matter of getting granulation to fill in the hole and then getting epithelialization. This does not seem to happen Also I sent him back to see podiatry Dr. Earleen Newport about the what felt to be bony outgrowth on the tip of his left great toe. Apparently after heel he left the clinic last week or the next day he developed a blood blister. He did see Dr. Earleen Newport. He went on to have a debridement of the medial nail cuticle he now has an open area here as well as some denuded skin. They did an  x-ray apparently does have a bony outgrowth or spur but I am not able to look at this. They are using topical antibiotics apparently  there was some suggested he use Santyl. Patient's wife was anxious for my opinion of this 08/20/2018; we are able to keep the wound open this time instead of the thick subcutaneous tissue closing over the top of it however unfortunately once again this probes to bone. I do not see any evidence of infection and previous MRI did not show osteomyelitis. I elected to go back to the Oasis to see if we can stimulate some granulation. Last saw his vascular interventional cardiologist Dr. Andree Elk at the beginning of August and he had a repeat procedure. Nevertheless I wonder how much blood flow he has down to this area With regards to the left first toe he is seeing Dr. Earleen Newport next week. They are applying Bactroban to this area. 08/27/2018 Once again he comes in with thick eschar and subcutaneous tissue over the top of the small probing hole. This does not appear to go down to bone but it still has roughly the same depth. I reapplied Oasis today Over the left great toe there appears to be more of the wound at the tip of his toe than there was last week. This has an eschar on the surface of it. Will change to Santyl. There is seeing podiatry this afternoon 09/03/2018 He comes in today with the area on the transmetatarsal's site with a fair amount of callus, nonviable tissue over the circumference but it was not closed. I removed all of this as well as some subcutaneous debris and reapplied Oasis. There is no exposed bone The area over the tip of the left great toe started off as a nodule of uncertain etiology. He has been followed with podiatry. They have been removing part of the medial nail bed. He has nonviable tissue over the wound he been using Santyl in this area 09/10/18 Unfortunately comes in with neither wound area looking improved. The transmetatarsal amputation site once Again has nonviable debris over the surface requiring debridement. Unfortunately underneath this there is nothing that looks  viable and this once again goes right down to bone. There is no purulent drainage and no erythema. Also the surgical wound from podiatry on the left first toe has an ischemic-looking eschar over the surface of the tip of the toe I have elected not to attempt candidly debride this His wife as arranged for him to have follow-up noninvasive studies in [place] under the care of Dr. Andree Elk clinic. The question is he has known severe PAD. They had recently seen him in August. He has had revascularizations in both legs within the last 4 or 5 months. He is not complaining of pain and I cannot really get a history of claudication. He has not systemically unwell 09/20/2018 the patient has been to [facility] and been revascularized by Dr. Andree Elk earlier this week. Apparently he was able to open up the anterior tibial artery although I have not actually seen his formal report. He is going for an attempt to revascularize on the left on Monday. He is apparently working with an investigational stent for lower extremity arteries below the knee and he has talked to the patient about placing that on Monday if possible. The patient has been using silver alginate on the transmetatarsal amputation site and Santyl on the left 09/30/2018; patient had his revascularization on the left this apparently included a standard approach as well  as a more distal arterial catheterization although I do not have any information on this from Dr. Andree Elk. In fact I do not even see the initial revascularization that he had on the right. I have included the arterial history from Dr. Andree Elk last note however below; ASSESSMENT/PLAN: 1. Hx of Critical limb ischemia bilateral lower extremities, PAD: -s/p transmetatarsal amputation of the RLE. -s/p right ATA percutaneous revascularization procedure x2, most recently 11/20/2016 s/p PTA of right AT 100% to less than 20% with a 2.5 x 200 balloon. -On original angiogram 05/15/2016, he had a  significant 70-95% left popliteal artery stenosis with AT and PT artery occlusions and one vessel runoff via the peroneal artery. -03/21/2018 s/p PTA of 90% left popliteal artery to <10% with a 5x20 cutting balloon, PTA of 90% left peroneal to <20% with a 3x20 balloon, PTA of 100% left AT to <20% with a 2.5x220 balloon. 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 Will plan on the LLE soon after. Risks/benefits of procedure have been discussed and patient has elected to proceed. We have been using endoform to the right TMA amputation site wound and Santyl to the left great toe 10/07/2018; the area on the tip of his left great toe looked better we have been using Santyl here. We continue to have a very difficult probing hole on the right TMA amputation site we have been using endoform. I went on to use his fifth Oasis today 10/14/2018; the tip of the left great toe continues to look better. We have been using Santyl here the surface however is healthy and I think we can change to an alginate. The right TMA has not changed. Once again he has no superficial opening there is callus and thick subcutaneous tissue over the orifice once you remove this there is the probing area that we have been dealing with without too much change. There is no palpable bone I have been placing Oasis here and put Oasis #6 in this today after a more vigorous debridement 10/21/18; the left great toe still has necrotic surface requiring debridement. The right TMA site hasn't changed in view of the thick callus over the wound bed. With removal of this there is still the opening however this does not appear to have the same depth. Again there is no palpable bone. Oasis was replaced 10/28/2018; patient comes in with both wounds looking worse. The area over the first toe tip is now down  to bone. The area over the TMA site is deeper and down to bone clearly with a increase in overall wound area. Equally concerning on the right TMA is the complete absence of a pulse this week which is a change. We are not even able to Doppler this. On the right he has a noncompressible ABI greater than 1.4 11/04/2018. Both wounds look somewhat worse. X-rays showed no osteomyelitis of the right foot but on the left there was underlying osteomyelitis in the left great toe distal phalanx. I been on the phone to Dr. Andree Elk surface at [facility] in [place] and they are arranging for another angiogram on the right on Monday. I have him on doxycycline for the osteomyelitis in the left great toe for now. Infectious disease may be necessary 1/17; 2-week hiatus. Patient was admitted to hospital at [place]. My understanding is he underwent an angioplasty of the right  anterior tibial artery and had stents placed in the left anterior artery and the left tibial peroneal trunk. This was done by Dr. Andree Elk of interventional radiology. There is no major change in either 1 of the wounds. They have been using Aquacel Ag. As far as they are aware no imaging studies were done of the foot which is indeed unfortunate. I had him on doxycycline for 2 weeks since we identified the osteomyelitis in the left great toe by plain x-ray. I have renewed that again today. I am still suspicious about osteomyelitis in the amputation site and would consider doing another MRI to compare with the one done in September. The idea of hyperbaric oxygen certainly comes up for discussion 1/24; no major change in either wound area. I have him on doxycycline for osteomyelitis at the tip of the left great toe. As noted he has been previously and recently revascularized by Dr. Andree Elk at [place]. He is tolerating the doxycycline well. For some reason we do not have an infectious disease consult yet. Culture of drainage from the right foot site last week was  negative 1/31; MRI of the right foot did not show osteomyelitis of the right ankle and foot. Notable for a skin ulceration overlying the second metatarsal stump with generalizing soft tissue edema of the ankle and foot consistent with cellulitis. Noted to have a partial-thickness tear of the Achilles tendon 7.5 cm proximal to the insertion Nothing really new in terms of symptoms. Patient's area on the tip of the left great toe is just about closed although he still has the probing area on the metatarsal amputation site. Appointment with Dr. Linus Salmons of infectious disease next week 2/7; Dr. Novella Olive did not feel that any further antibiotics were necessary he would follow-up in 2 months. The left great toe appears to be closed still some surface callus that I gently looked under high did not see anything open or anything that was threatening to be open. He still has the open area on the mid part of his TMA site using endoform 2/14; left great toe is closed and he is completing his doxycycline as of last Sunday. He is not on any antibiotics. Unfortunately out of the right foot his wife noticed some subdermal hemorrhage this week. He had been walking 2 miles I had given him permission to do so this is not really a plantar wound. Using endoform to this wound but I changed to silver alginate this week 2/21; left great toe remains closed. Culture last week grew Streptococcus angiosis which I am not really familiar with however it is penicillin sensitive and I am going to put him on Augmentin. Not much change in the wound on the right foot the deep area is still probing precariously close to bone and the wound on the margin of the TMA is larger. 2/28; left great toe remains closed. He is completing the Augmentin I gave him last week. 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 This is being shown to Dr. Andree Elk at [place] to see if there is anything else that can be done here. He  has been using silver alginate strips on the right 3/6; left great toe remains closed. He sees Dr. Andree Elk on Monday. The area on the plantar aspect of the right foot has the same small orifice with thick callused tissue around this. However this time with removal of the callus tissue the wound is open all the way along the incision line  to the end medially. We have been using silver alginate. 3/13; left toe remains closed although the area is callused. He sees Dr. Jacqualyn Posey of podiatry next week. The area on the plantar right foot looked a lot better this week. Culture I did of this was negative we use silver alginate. He is going next week for an attempt at revascularization by Dr. Andree Elk 3/23; left toe remains closed although the area is callused. He will see Dr. Jacqualyn Posey in follow-up. The area on the right plantar foot continues to look surprisingly better over the last 3 visits. We have been using silver alginate. The revascularization he was supposed to have by Dr. Andree Elk at [facility] in [place] has been canceled [facility] procedure] 4/6; the right foot remains closed albeit callused. Podiatry canceled the appointment with regards to the left great toe. He has not seen Dr. Andree Elk at [facility] but thinks that Dr. Andree Elk has "done all he can do". He would be a candidate for the hemostaemix trial Readmission 07/29/2019 [name] is a man we know well from at least 3 previous stays in this clinic. He is a type II diabetic with severe PAD followed by Dr. Andree Elk at [facility] in [place]. 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 So far everything in both of these areas has remained closed. About 2 weeks ago he went to see his podiatrist at [facility] Dr. Babs Bertin. He had a thick callus on the left fifth metatarsal head that was shaved. He has developed an open wound in this  area. They have been offloading this in his diabetic shoes. The patient has not had any more revascularizations by Dr. Andree Elk since the last time he was here. ABI in our clinic at the posterior tibial on the left was 1.06 10/6; no real change in the area on the plantar met head. Small wound with 2 mm of depth. He has thick skin probably from pressure around the wound. We have been using silver alginate 10/13; small wound in the left fifth plantar met head. Arrives today with undermining laterally and purulent drainage. Our intake nurse cultured this. His wife stated they noticed a change in color over the last day or 2. He is not systemically unwell 10/19; small wound on the fifth plantar metatarsal head. Culture I did last week showed Staphylococcus lugdunensis. Although this could be a skin contaminant the possibility of a skin and soft tissue infection was there. I did give him empiric doxycycline which should have covered this. We are using silver alginate to the wound. We put him in a total contact cast today. 10/22; small wound on the fifth plantar metatarsal head. He has completed antibiotics. He also has severe PAD which worries me about just about any wound on this man's foot 10/29; small superficial area on the fifth plantar metatarsal head. Measuring slightly smaller. He is not currently on any antibiotics. I been using a total contact cast in this man with very severe PAD but he seems to be tolerating this well 11/5; left plantar fifth metatarsal head. Silver alginate being used under a total contact cast 11/12; wound not much different than last week. Using a #15 scalpel debridement around the wound. Still silver collagen under a total contact cast. He has severe PAD and that may be playing a role in this 11/19;  disappointing that the wound is not really changed that much. I think it is come down in overall surface area because originally this was on the lateral part of the fifth  metatarsal head however recently it is not really changed. Most of this is filled in but it will not epithelialized there is surface debris on this which may be mostly related to ischemia. They have an appointment with Dr. Andree Elk on 12/9 09/24/2019 on evaluation today patient appears to be doing somewhat better with regard to the wound on the left fifth metatarsal head. Fortunately there does not appear to be any signs of active infection at this time. No fevers, chills, nausea, vomiting, or diarrhea. The wound does not appear to be completely closed and I do feel like the Hydrofera Blue is helping to some degree although I feel like the cast as well as keeping things from breaking down or getting any larger at least. As far as his wound overview it shows that the overall size of the wound is slightly smaller but really maintaining within the realm of about the same. He had no troubles with the cast which is good news. 12/1; left fifth metatarsal head. Perhaps somewhat more vibrant. We have been using Hydrofera Blue. He sees Dr. Andree Elk at [place] 1 week tomorrow. He be back next week and I hope to have a better idea which way the wound is going however I will not cast him next week in case Dr. Andree Elk wants to reevaluate things in his foot. The left leg is the leg with the stent in place and I wonder whether these are going to have to be reevaluated. 12/8; left fifth metatarsal head. Quite a bit better this week. Only a small open area remains. He sees Dr. Andree Elk at [place] tomorrow. Dr. Andree Elk is previously done his vascular interventions. He has 2 stents in the left leg as I remember things. I therefore will not put him in a total contact cast today. He will be using a forefoot off loader. We will use silver collagen on the wound. We will see him again next week 12/15; left fifth metatarsal head. He saw Dr. Andree Elk and is scheduled for an angiogram on Thursday. Unfortunately although his wound was a lot better  last week it is really deteriorated this week. Small punched-out hole with depth and overhanging tissue. We have been using Hydrofera Blue 12/22; patient had an 80% stenosis proximal to the stent I believe in the below-knee popliteal artery. This was opened by Dr. Andree Elk. According the patient the stent itself was satisfactory. I have not been able to review this note. 12/29; patient arrives today with the wound about the same size some undermining medially. We have been using Hydrofera Blue under a total contact cast and that is what we will do again today 11/04/2019. Slightly smaller in orifice but about 4 mm undermining almost circumferentially. We have been using Hydrofera Blue. Apparently the Hydrofera Blue did not stay on the wound after we removed the cast. Some minor looking cast irritation on the right lateral 1/12; unfortunately things did not go well this week. Arrives in clinic today with a deeper wound with undermining more concerning a area of pus. Specimen was obtained for culture. We are not going to be able to put him in a cast today. 1/19; purulent material last week which I cultured showed Enterobacter cloacae. He was on ampicillin previously prescribed by his primary doctor which should not have covered this however mysteriously  the wound looks somewhat better. There is less depth less erythema certainly no drainage. He will need a course of ciprofloxacin which I will provide today. 1/26; he has completed the ciprofloxacin. I don't think he needs any additional antibiotics. The areas on the left fifth plantar met head. We've been using silver alginate Electronic Signature(s) Signed: 11/25/2019 5:32:11 PM By: [name] MD Entered By: [name] on 11/25/2019 13:33:14 -------------------------------------------------------------------------------- Physical Exam Details Patient Name: Date of Service: [name], [name] 11/25/2019 12:30 PM Medical Record [id number]  Patient Account Number: [id number] Date of Birth/Sex: Treating RN: 1946-06-10 (74 y.o. M) Primary Care Provider: [name] Other Clinician: Referring Provider: Treating Provider/Extender:Naphtali Riede, [name], [name] in Treatment: 17 Constitutional Sitting or standing Blood Pressure is within target range for patient.. Pulse regular and within target range for patient.Marland Kitchen Respirations regular, non-labored and within target range.. Temperature is normal and within the target range for the patient.Marland Kitchen Respiratory work of breathing is normal. Integumentary (Hair, Skin) No erythema around the wound bed. No purulent drainage. Notes Wound exam; there is no purulent drainage. I did remove some thick skin from around the wound margin last time but it just seems to have reoccurred he has a small center shaped wound. There may be more depth here than is easily appreciated with no purulent drainage. Nothing needed culturing I elected not to do mechanical debridement on the Electronic Signature(s) Signed: 11/25/2019 5:32:11 PM By: [name] MD Entered By: [name] on 11/25/2019 13:34:48 -------------------------------------------------------------------------------- Physician Orders Details Patient Name: Date of Service: [name], [name] 11/25/2019 12:30 PM Medical Record [id number] Patient Account Number: [id number] Date of Birth/Sex: Treating RN: 02-16-1946 (73 y.o. [name]) [name] Primary Care Provider: [name] Other Clinician: Referring Provider: Treating Provider/Extender:Mickeal Daws, [name], [name] in Treatment: 17 Verbal / Phone Orders: No Diagnosis Coding ICD-10 Coding Code Description E11.621 Type 2 diabetes mellitus with foot ulcer E11.51 Type 2 diabetes mellitus with diabetic peripheral angiopathy without gangrene L97.521 Non-pressure chronic ulcer of other part of left foot limited to breakdown of skin L03.116 Cellulitis of left lower  limb Follow-up Appointments Return Appointment in 1 week. Dressing Change Frequency Wound #5 Left Metatarsal head fifth Change dressing every day. Wound Cleansing Wound #5 Left Metatarsal head fifth May shower and wash wound with soap and water. Primary Wound Dressing Wound #5 Left Metatarsal head fifth Calcium Alginate with Silver Other: - pad right great toe for protection Secondary Dressing Kerlix/Rolled Gauze Dry Gauze Off-Loading Other: - Fore foot off loader Electronic Signature(s) Signed: 11/25/2019 5:32:11 PM By: [name] MD Signed: 11/26/2019 7:19:07 AM By: [name] RN Entered By: [name] on 11/25/2019 13:12:33 -------------------------------------------------------------------------------- Problem List Details Patient Name: Date of Service: [name], [name] 11/25/2019 12:30 PM Medical Record [id number] Patient Account Number: [id number] Date of Birth/Sex: Treating RN: [birth year] (73 y.o. Staci Acosta, Morey Hummingbird Primary Care Provider: [name] Other Clinician: Referring Provider: Treating Provider/Extender:Nichalas Coin, [name], [name] in Treatment: 17 Active Problems ICD-10 Evaluated Encounter Code Description Active Date Today Diagnosis E11.621 Type 2 diabetes mellitus with foot ulcer 07/29/2019 No Yes E11.51 Type 2 diabetes mellitus with diabetic peripheral 07/29/2019 No Yes angiopathy without gangrene L97.521 Non-pressure chronic ulcer of other part of left foot 07/29/2019 No Yes limited to breakdown of skin L03.116 Cellulitis of left lower limb 08/12/2019 No Yes Inactive Problems Resolved Problems Electronic Signature(s) Signed: 11/25/2019 5:32:11 PM By: [name] MD Entered By: [name] on 11/25/2019 13:31:38 -------------------------------------------------------------------------------- Progress Note Details Patient Name: Date of Service: [name] 11/25/2019 12:30 PM  Medical Record [id number] Patient  Account Number: [id number] Date of Birth/Sex: Treating RN: 08-14-1946 (74 y.o. M) Primary Care Provider: [name] Other Clinician: Referring Provider: Treating Provider/Extender:Doretha Goding, [name], [name] in Treatment: 17 Subjective History of Present Illness (HPI) 05/18/16; this is a 74year-old diabetic who is a type II diabetic on insulin. The history is that he traumatized his right foot developed a sore sometime in late March. Shortly thereafter he went on a cruise but he had to get off the cruise ship in [place] and fly urgently back to [place] where he was admitted to [facility] and ultimately underwent a transmetatarsal amputation by Dr. 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 According to the patient and his wife this wound never really healed. He was seen on 2 occasions in the wound care center in [place] and had vascular studies and then was referred urgently to Dr. Bridgett Larsson of vascular surgery. He underwent an angiogram on 05/10/16. Unfortunately nothing really could be done to improve his vascular status. He had a 75-90% stenosis in the midsegment of 1 segment of the posterior femoral artery. He had a patent popliteal, his anterior tibial occluded shortly after takeoff. Perineal had a greater than 90% stenosis posterior tibial is occluded feet had no distal collaterals feed distal aspect of the transmetatarsal amputation site. The patient tells me that he had a prolonged period of Santyl by Dr. Doran Durand was some initial improvement but then this was stopped. I think they're only applying daily dressings/dry dressings. He has not had a recent x-ray of the right foot he did have one before his surgery in April. His wife by the dimensions of the wound/surgical site being followed at home since 4/20. At that point the dimensions were 0.5 x 12 x 0.2 on 7/19 this was 1.8 x 6 x 0.4. He is not currently on any antibiotics. His hemoglobin A1c in early April was 12.9 at  that point he was started on insulin. Apparently his blood sugars are much lower he has an appointment with Dr. Legrand Como Alteimer of endocrine next week. 05/29/16 x-ray of the area did not show osteomyelitis. I think he probably needs an MRI at this point. His wife is asking about something called"Yireh" cream which is not FDA approved. I have not heard of this. 06/22/16; MRI did not really suggest osteomyelitis. There was minimal marrow edema and enhancement in the stump of the second metatarsal felt to be secondary likely to postoperative change rather than osteomyelitis. The patient has arranged his own consultation with Dr. Andree Elk at [place], apparently their daughter lives in [place] and has some connection here. Any improvement in vascular supply by Dr. Andree Elk would of course be helpful. Dr. Bridgett Larsson did not feel that anything further could be done other than amputation if wound care did not result in healing or if the area deteriorates. 06/26/16; the patient has been to see Dr. Andree Elk at [place] and had an angiogram. He is going for a procedure on Thursday which will involve catheterization. I'm not sure if this is an anterograde or retrograde approach. He has been using Santyl to the wound 07/10/16; the patient had a repeat angiogram and angioplasty at [place] by Dr. Brunetta Jeans. His angiogram showed right CFA and profundal widely patent. The right as of a.m. popliteal artery were widely patent the right anterior tibial was occluded proximally and reconstitutes at the ankle. Peroneal artery was patent to the foot. Posterior tibial artery was occluded. The patient had angioplasty of the  anterior tibial artery.. This was quite successful. He was recommended for Plavix as well as aspirin. 07/17/16; the patient was close to be a nurse visit today however the outer dressing of the Apligraf fell off. Noted drainage. I was asked to see the wound. The patient is noted an odor however his wife had noted that. Drainage  with Apligraf not necessarily a bad thing. He has not been systemically unwell 07/24/16; we are still have an issue with drainage of this wound. In spite of this I applied his second Apligraf. Medially the area still is probing to bone. 08/07/16; Apligraf reapplied in general wound looks improved. 08/21/16 Apligraf #4. Wound looks much better 09/04/16 patientt's wound again today continues to appear to improve with the application of the Apligraf's. He notes no increased discomfort or concerns at this point in time. 09/18/16; the patient returns today 2 weeks after his fifth application of Apligraf. Predictably three quarters of the width of this wound has healed. The deep area that probe to bone medially is still open. The patient asked how much out-of-pocket dollars would be for additional Apligraf's. 09/25/16; now using Hydrofera Blue. He has completed 5 Apligraf applications with considerable improvement in this deep open transmetatarsal amputation site. His wound is now a triangular-shaped wound on the medial aspect. At roughly 12 to 2:00 this probes another centimeter but as opposed to in the past this does not probe to bone. The patient has been seen at [facility] by Dr. Zenia Resides. He is not planning to do any more revascularization unless the wound stalls or worsens per the patient 10/02/16; 0.7 x 0.8 x 0.8. Unfortunately although the wound looks stable to improved. There is now easily probable bone. This hasn't been present for several weeks. Patient is not otherwise symptomatic he is not experiencing any pain. I did a culture of the wound bed 10/09/16. Deterioration last week. Culture grew MRSA and although there is improvement here with doxycycline prescribed over the phone I'm going to try to get him linezolid 600 twice a day for 10 days today. 10/16/16; he is completing a weeks worth of linezolid and still has 3 more days to go. Small triangular-shaped open area with some degree of undermining.  There is still palpable bone with a curet. Overall the area appears better than last week 10/20/16 he has completed the linezolid still having some nausea and vomiting but no diarrhea. He has exposed bone this week which is a deterioration. 10/27/16 patient now has a small but probing wound down to bone. Culture of this bone that I did last week showed a few methicillin-resistant staph aureus. I have little doubt that this represents acute/subacute osteomyelitis. The patient is currently on Doxy which I will continue he also completed 10 days of linezolid. We are now in a difficult situation with this patient's foot after considerable discussion we will send him back to see Dr. Doran Durand for a surgical opinion of this I'm also going to try to arrange a infectious disease consult at [facility] hopefully week and get this prior to her usual 4-6 weeks we having [place]. The patient clearly is going to need 6 weeks of IV vancomycin. If we cannot arrange this expediently I'll have to consider ordering this myself through a home infusion company 11/03/16; the patient now has a small in terms of circumference but probing wound. No bone palpable today. The patient remains on doxycycline 100 twice a day which should support him until he sees infectious disease at [facility] next  week the following week on Wednesday I believe he has an appointment with Dr. Andree Elk at [facility] who is his vascular cardiologist. Finally he has an appointment with Dr. Doran Durand on 11/22/16 we have been using silver alginate. The patient's wife states they are having trouble getting this through [employer] 11/13/16; the patient was seen by infectious disease at [facility] in the 11th PICC line placed in preparation for IV antibiotics. A tummy he has not going to get IV vancomycin o Ceftaroline. They've also ordered an MRI. Patient has a follow-up with Dr. Doran Durand on 11/22/16 and Dr. Andree Elk at [facility] tomorrow 11/23/16 the patient is on daptomycin  as directed by infectious disease at [facility]. He is also been back to see Dr. Andree Elk at [facility]. He underwent a repeat arteriogram. He had a successful PTA of the right anterior tibial artery. He is on dual antiplatelete treatment with Plavix and aspirin. Finally he had the MRI of his foot in [place]. This showed cellulitis about the foot worse distally edema and enhancement in the reminiscent of the second metatarsal was consistent with osteomyelitis therefore what I was assuming to be the first metatarsal may be actually the second. He also has a fluid collection deep to the calcaneus at the level of the calcaneal spur which could be an abscess or due to adventitial bursitis. He had a small tear in his Achilles 11/30/16; the patient continues on daptomycin as directed by infectious disease at [facility]. He is been revascularized by Dr. Andree Elk at [facility] in [place]. He has been to see Gretta Arab who was the orthopedic surgeon who did his original amputation. I have not seen his not however per the patient's wife he did not offer another surgical local surgical prodecure to remove involved bone. He verbalized his usual disbelief in not just hyperbarics but any medical therapy for this condition(osteomyelitis). I discussed this in detail with the patient today including answering the question about a BKA definitively "curing" the current condition. 12/07/16; the patient continues on daptomycin as directed by infectious disease at [facility]. This is directed at the MRSA that we cultured from his bone debridement from 12/22. Lab work today shows a white count of 8.7 hemoglobin of 10.5 which is microcytic and hypochromic differential count shows a slightly elevated monocyte count at 1.2 eosinophilic count of 0.6. His creatinine is 1.11 sedimentation rate apparently is gone from 35-34 now 40. I explained was wife I don't think this represents a trend. His total CK is 48 12/14/16- patient is here for  follow-up evaluation of his right TMA site. He continues to receive IV daptomycin per infectious disease. His serum inflammatory markers remain elevated. He complains of intermittent pain to the medial aspect of the TMA site with intermittent erythema. He voices no complaints or concerns regarding hyperbaric therapy. Overall he and his wife are expressing a frustration and discouragement regarrding the length of time of treatment. 12/21/16; small open wound at roughly the first or second metatarsal metatarsalphalyngeal joint reminiscence of his transmetatarsal amputation site he continues to receive IV daptomycin per infectious disease at [facility]. He will finish these a week tomorrow. He has lab work which I been copied on. His white count is 10.8 hemoglobin 8.9 MCV is low at 75., MCH low at 24.5 platelet count slightly elevated at 626. Differential count shows 70% neutrophils 10% monocytes and 10% eosinophils. His comprehensive metabolic panel shows a slightly low sodium at 133 albumin low at 3.1 total CK is normal at 42  sedimentation rate is much higher at 82. He has had iron studies that show a serum iron of 15 and iron binding capacity of 238 and iron saturation of 6. This is suggestive of iron deficiency. B12 and folate were normal ferritin at 113 The patient tells me that he is not eating well and he has lost weight. He feels episodically nauseated. He is coughing and gagging on mucus which she thinks is sinusitis. He has an appointment with his primary doctor at 5:00 this afternoon in [facility] 01/02/17; the patient developed a subacute pneumonitis. He was admitted to [facility] after a CT scan showed an extensive interstitial pneumonitis [I have not yet seen this]. 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 He has since been discharged. He is not on oxygen. He feels fatigued and very  short of breath with exertion. At [facility] the wound care nurse there felt that his wound was healed. He did complete his daptomycin and has follow-up with infectious disease on Friday. X-rays I did before he went to [facility] still suggested residual osteomyelitis in the anterior aspect of the second must metatarsal head. I'm not sure I would've expected any different. There was no other findings. I actually think I did this because of erythema over the first metatarsal head reminiscent 01/11/17; the patient has eosinophilic pneumonitis. He has been reviewed by pulmonology and given clearance for hyperbarics at least that's what his wife says. I'll need to see if there is note in care everywhere. Apparently the prognosis for improvement of daptomycin induced eosinophilic granulocyte is is 3 months without steroids. In the meantime infectious disease has placed him on doxycycline until the wound is closed. He is still is lost a lot of weight and his blood sugars are running in the mid 60s to low 80s fasting and at all times during the day 01/18/17; he has had adjustments in his insulin apparently his blood sugars in the morning or over 100. He wants to restart his hyperbaric treatment we'll do this at 1:00. 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 I think there is good reason to complete his treatments in order to give him the best chance of maintaining a healed status and these DFU 3 wounds with MRSA infection in the bone 02/15/17; the patient was seen today in conjunction with HBO. He completed hyperbaric oxygen today. The open area on his transmetatarsal site has remained closed. There was an area of erythema when I saw him earlier in the week on the posterior heel although that is resolved as of today as well. He has been using a cam walker. This is a patient who came to [place] after a transmetatarsal amputation  that was necrotic and dehisced. He required revascularization percutaneously on 2 different occasions by Dr. Andree Elk of invasive cardiology at [facility]. 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 He went to [facility] infectious disease and had IV daptomycin for 5 weeks before developing eosinophilic pneumonitis and requiring an admission to hospital/ICU. He made a good recovery and is continued on doxycycline since. As mentioned his foot is closed now. He has a follow-up with Dr. Andree Elk tomorrow. He is going to [employer] on Monday for a custom-made shoe READMISSION Last visit Dr. Andree Elk [facility] 02/16/17 1. Critical limb  ischemia of the RLE:  s/p transmetatarsal amputation of the RLE, now completely healed.  s/p right ATA percutaneous revascularization procedure x2, most recently 11/20/2016 s/p PTA of right AT 100% to less than 20% with a 2.5 x 200 balloon.  He does have some residual osteomyelitis, but given the wound is closed, the orthopedist has recommended to follow. He has a fitting for a special shoe coming up next week. He has completed a course of daptomycin and doxycycline and he has been discharged by ID. He has completed a course of hyperbaric therapy.  Continue Continue medical management with aspirin, Plavix and statin therapy. We will discuss ongoing Plavix therapy at follow-up in 6 months.  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 Given no symptoms, we will conservatively manage. He doesn't want to do any more invasive studies at this time, which is reasonable. The patient arrives today out of 2 concerns both on the right transmetatarsal site. 1 at the level of the reminiscent fifth metatarsal head and the other at roughly the first or second. Both  of these look like dark subcutaneous discoloration probably subdermal bleeding. He is recently obtained new adaptive footwear for the right foot. He also has a callus on the left fifth dorsal toe however he follows with podiatry for this and I don't think this is any issue. ABIs in this clinic today were 0.66 on the right and 1.06 on the left. On entrance into our clinic initially this was 0.95 and 0.92. He does not describe current claudication. They're going away on a cruise in 8 weeks and I think are trying to do the month is much as they can proactively. They follow with podiatry and have an appointment with Dr. Andree Elk in October READMISSION 06/25/18 This is a patient that we have not seen in almost a year. He is a type II diabetic with known PAD. He is followed by Dr. Andree Elk of interventional cardiology at [facility] in [place]. He is required revascularization for significant PAD. When we first saw him he required a transmetatarsal amputation. He had underlying osteomyelitis with a nonhealing surgical wound. This eventually closed with wound care, IV antibiotics and hyperbaric oxygen. They tell me that he has a modified shoe and he is very active walking up to 4 miles a day. He is followed by Dr. Geroge Baseman of podiatry. His wife states that he underwent a removal of callus over this site on July 9. She felt there may be drainage from this site after that although she could never really determined and there was callus buildup again. On 06/01/18 there was pressure bleeding through the overlying callus. he was given a prescription for 7 days of Bactrim. Fortuitously he has an appointment with Dr. Andree Elk on 06/04/18 and he immediately underwent revascularization of the right leg although I have not had a chance to review these records in care everywhere. This was apparently done through anterior and retrograde access. On 06/06/18 he had another debridement by Dr. Bernette Mayers. Vitamin soaking with Epsom salts  for 20 minutes and applying calcium alginate. The original trans-met was on April 2017 I believe by Dr. Doran Durand. His ABI in our clinic was noncompressible today. 07/02/18; x-ray I ordered last week was negative for osteomyelitis. Swab culture was also negative. He is going to require an MRI which I have ordered today. 07/09/18; surprisingly the MRI of the foot  that I ordered did not show osteomyelitis. He did suggest the possibility of cellulitis. For this reason I'll go ahead and give him a 10 day course of doxycycline. Although the previous culture of this area was negative 07/16/18; it arrives with the wound looking much the same. Roughly the same depth. He has thick subcutaneous tissue around the wound orifice but this still has roughly the same depth. Been using silver alginate. We applied Oasis #1 today 07/23/2018; still having to remove a lot of callus and thick subcutaneous tissue to actually define the wound here. Most of this seems to have closed down yet he has a comma shaped divot over the top of the area that still I think is open. We applied Oasis #2 His wife expressed concern about the tip of his left great toe. This almost looks like a small blister. She also showed it today to her podiatrist Dr. Geroge Baseman who did not think this was anything serious. I am not sure is anything serious either however I think it bears some watching. He is not in any pain however he is insensate 07/30/2018;; still on a lot of nonviable tissue over the surface of the wound however cleaning this up reveals a more substantial wound orifice but less of a probing wound depth. This is not probed to bone. There is no evidence of infection The wife is still concerned about a non-open area on the tip of his left great toe. Almost feels like a bony outgrowth. She had previously showed this to podiatry. I do not think this is a blister. A friction area would be possible although he is really not walking according to his  wife. He had a small skin tag on his right buttock but no open wound here either 08/06/2018; we applied a third Oasis last week. Unfortunately there is really no improvement. Still requiring extensive debridement to expose the wound bed from a horizontal slitlike depression. I still have not been able to get this to fill in properly. On the positive side there is now no probable bone from when he first came into the facility. I changed him to silver alginate today after a reasonably aggressive debridement 08/13/2018; once again the patient comes in with skin and subcutaneous tissue closing over the small probing area with the underlying cavity of the wound on the right TMA site. I applied silver alginate to this last week. Prior to that we used Oasis x3 still not able to get this area granulating. He does not have a probing area of the bone which is an improvement from when I spur started working on this and an MRI did not suggest osteomyelitis. I do not see evidence of infection here but I am increasingly concerned about why I cannot get this area to granulate. Each time I debrided this is looks like this is simply a matter of getting granulation to fill in the hole and then getting epithelialization. This does not seem to happen Also I sent him back to see podiatry Dr. Earleen Newport about the what felt to be bony outgrowth on the tip of his left great toe. Apparently after heel he left the clinic last week or the next day he developed a blood blister. He did see Dr. Earleen Newport. He went on to have a debridement of the medial nail cuticle he now has an open area here as well as some denuded skin. They did an x-ray apparently does have a bony outgrowth or spur but I am not  able to look at this. They are using topical antibiotics apparently there was some suggested he use Santyl. Patient's wife was anxious for my opinion of this 08/20/2018; we are able to keep the wound open this time instead of the thick  subcutaneous tissue closing over the top of it however unfortunately once again this probes to bone. I do not see any evidence of infection and previous MRI did not show osteomyelitis. I elected to go back to the Oasis to see if we can stimulate some granulation. Last saw his vascular interventional cardiologist Dr. Andree Elk at the beginning of August and he had a repeat procedure. Nevertheless I wonder how much blood flow he has down to this area With regards to the left first toe he is seeing Dr. Earleen Newport next week. They are applying Bactroban to this area. 08/27/2018 ooOnce again he comes in with thick eschar and subcutaneous tissue over the top of the small probing hole. This does not appear to go down to bone but it still has roughly the same depth. I reapplied Oasis today ooOver the left great toe there appears to be more of the wound at the tip of his toe than there was last week. This has an eschar on the surface of it. Will change to Santyl. There is seeing podiatry this afternoon 09/03/2018 ooHe comes in today with the area on the transmetatarsal's site with a fair amount of callus, nonviable tissue over the circumference but it was not closed. I removed all of this as well as some subcutaneous debris and reapplied Oasis. There is no exposed bone ooThe area over the tip of the left great toe started off as a nodule of uncertain etiology. He has been followed with podiatry. They have been removing part of the medial nail bed. He has nonviable tissue over the wound he been using Santyl in this area 09/10/18 ooUnfortunately comes in with neither wound area looking improved. The transmetatarsal amputation site once Again has nonviable debris over the surface requiring debridement. Unfortunately underneath this there is nothing that looks viable and this once again goes right down to bone. There is no purulent drainage and no erythema. ooAlso the surgical wound from podiatry on the left  first toe has an ischemic-looking eschar over the surface of the tip of the toe I have elected not to attempt candidly debride this ooHis wife as arranged for him to have follow-up noninvasive studies in [place] under the care of Dr. Andree Elk clinic. The question is he has known severe PAD. They had recently seen him in August. He has had revascularizations in both legs within the last 4 or 5 months. He is not complaining of pain and I cannot really get a history of claudication. He has not systemically unwell 09/20/2018 the patient has been to [facility] and been revascularized by Dr. Andree Elk earlier this week. Apparently he was able to open up the anterior tibial artery although I have not actually seen his formal report. He is going for an attempt to revascularize on the left on Monday. He is apparently working with an investigational stent for lower extremity arteries below the knee and he has talked to the patient about placing that on Monday if possible. The patient has been using silver alginate on the transmetatarsal amputation site and Santyl on the left 09/30/2018; patient had his revascularization on the left this apparently included a standard approach as well as a more distal arterial catheterization although I do not have any information  on this from Dr. Andree Elk. In fact I do not even see the initial revascularization that he had on the right. I have included the arterial history from Dr. Andree Elk last note however below; ASSESSMENT/PLAN: 1. Hx of Critical limb ischemia bilateral lower extremities, PAD: -s/p transmetatarsal amputation of the RLE. -s/p right ATA percutaneous revascularization procedure x2, most recently 11/20/2016 s/p PTA of right AT 100% to less than 20% with a 2.5 x 200 balloon. 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 -03/21/2018 s/p PTA  of 90% left popliteal artery to <10% with a 5x20 cutting balloon, PTA of 90% left peroneal to <20% with a 3x20 balloon, PTA of 100% left AT to <20% with a 2.5x220 balloon. 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 Will plan on the LLE soon after. Risks/benefits of procedure have been discussed and patient has elected to proceed. We have been using endoform to the right TMA amputation site wound and Santyl to the left great toe 10/07/2018; the area on the tip of his left great toe looked better we have been using Santyl here. We continue to have a very difficult probing hole on the right TMA amputation site we have been using endoform. I went on to use his fifth Oasis today 10/14/2018; the tip of the left great toe continues to look better. We have been using Santyl here the surface however is healthy and I think we can change to an alginate. The right TMA has not changed. Once again he has no superficial opening there is callus and thick subcutaneous tissue over the orifice once you remove this there is the probing area that we have been dealing with without too much change. There is no palpable bone I have been placing Oasis here and put Oasis #6 in this today after a more vigorous debridement 10/21/18; the left great toe still has necrotic surface requiring debridement. The right TMA site hasn't changed in view of the thick callus over the wound bed. With removal of this there is still the opening however this does not appear to have the same depth. Again there is no palpable bone. Oasis was replaced 10/28/2018; patient comes in with both wounds looking worse. The area over the first toe tip is now down to bone. The area over the TMA site is deeper and down to bone clearly with a increase in overall wound area. Equally concerning on the right TMA is  the complete absence of a pulse this week which is a change. We are not even able to Doppler this. On the right he has a noncompressible ABI greater than 1.4 11/04/2018. Both wounds look somewhat worse. X-rays showed no osteomyelitis of the right foot but on the left there was underlying osteomyelitis in the left great toe distal phalanx. I been on the phone to Dr. Andree Elk surface at [facility] in [place] and they are arranging for another angiogram on the right on Monday. I have him on doxycycline for the osteomyelitis in the left great toe for now. Infectious disease may be necessary 1/17; 2-week hiatus. Patient was admitted to hospital at [place]. My understanding is he underwent an angioplasty of the right anterior tibial artery and had stents placed in the left anterior artery and  the left tibial peroneal trunk. This was done by Dr. Andree Elk of interventional radiology. There is no major change in either 1 of the wounds. They have been using Aquacel Ag. As far as they are aware no imaging studies were done of the foot which is indeed unfortunate. I had him on doxycycline for 2 weeks since we identified the osteomyelitis in the left great toe by plain x-ray. I have renewed that again today. I am still suspicious about osteomyelitis in the amputation site and would consider doing another MRI to compare with the one done in September. The idea of hyperbaric oxygen certainly comes up for discussion 1/24; no major change in either wound area. I have him on doxycycline for osteomyelitis at the tip of the left great toe. As noted he has been previously and recently revascularized by Dr. Andree Elk at [facility]. He is tolerating the doxycycline well. For some reason we do not have an infectious disease consult yet. Culture of drainage from the right foot site last week was negative 1/31; MRI of the right foot did not show osteomyelitis of the right ankle and foot. Notable for a skin ulceration overlying the second  metatarsal stump with generalizing soft tissue edema of the ankle and foot consistent with cellulitis. Noted to have a partial-thickness tear of the Achilles tendon 7.5 cm proximal to the insertion Nothing really new in terms of symptoms. Patient's area on the tip of the left great toe is just about closed although he still has the probing area on the metatarsal amputation site. Appointment with Dr. Linus Salmons of infectious disease next week 2/7; Dr. Novella Olive did not feel that any further antibiotics were necessary he would follow-up in 2 months. The left great toe appears to be closed still some surface callus that I gently looked under high did not see anything open or anything that was threatening to be open. He still has the open area on the mid part of his TMA site using endoform 2/14; left great toe is closed and he is completing his doxycycline as of last Sunday. He is not on any antibiotics. Unfortunately out of the right foot his wife noticed some subdermal hemorrhage this week. He had been walking 2 miles I had given him permission to do so this is not really a plantar wound. Using endoform to this wound but I changed to silver alginate this week 2/21; left great toe remains closed. Culture last week grew Streptococcus angiosis which I am not really familiar with however it is penicillin sensitive and I am going to put him on Augmentin. Not much change in the wound on the right foot the deep area is still probing precariously close to bone and the wound on the margin of the TMA is larger. 2/28; left great toe remains closed. He is completing the Augmentin I gave him last week. 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 This is being shown to Dr. Andree Elk at [place] to see if there is anything else that can be done here. He has been using silver alginate strips on the right 3/6; left great toe remains closed. He sees Dr. Andree Elk on Monday. The area on the plantar  aspect of the right foot has the same small orifice with thick callused tissue around this. However this time with removal of the callus tissue the wound is open all the way along the incision line to the end medially. We have been using silver alginate. 3/13; left toe  remains closed although the area is callused. He sees Dr. Jacqualyn Posey of podiatry next week. The area on the plantar right foot looked a lot better this week. Culture I did of this was negative we use silver alginate. He is going next week for an attempt at revascularization by Dr. Andree Elk 3/23; left toe remains closed although the area is callused. He will see Dr. Jacqualyn Posey in follow-up. The area on the right plantar foot continues to look surprisingly better over the last 3 visits. We have been using silver alginate. The revascularization he was supposed to have by Dr. Andree Elk at [facility] in [place] has been canceled [facility] procedure] 4/6; the right foot remains closed albeit callused. Podiatry canceled the appointment with regards to the left great toe. He has not seen Dr. Andree Elk at [facility] but thinks that Dr. Andree Elk has "done all he can do". He would be a candidate for the hemostaemix trial Readmission 07/29/2019 [name] is a man we know well from at least 3 previous stays in this clinic. He is a type II diabetic with severe PAD followed by Dr. Andree Elk at [facility] in [place]. 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 So far everything in both of these areas has remained closed. About 2 weeks ago he went to see his podiatrist at [facility] Dr. Babs Bertin. He had a thick callus on the left fifth metatarsal head that was shaved. He has developed an open wound in this area. They have been offloading this in his diabetic shoes. The patient has not had any more revascularizations by Dr. Andree Elk since the last  time he was here. ABI in our clinic at the posterior tibial on the left was 1.06 10/6; no real change in the area on the plantar met head. Small wound with 2 mm of depth. He has thick skin probably from pressure around the wound. We have been using silver alginate 10/13; small wound in the left fifth plantar met head. Arrives today with undermining laterally and purulent drainage. Our intake nurse cultured this. His wife stated they noticed a change in color over the last day or 2. He is not systemically unwell 10/19; small wound on the fifth plantar metatarsal head. Culture I did last week showed Staphylococcus lugdunensis. Although this could be a skin contaminant the possibility of a skin and soft tissue infection was there. I did give him empiric doxycycline which should have covered this. We are using silver alginate to the wound. We put him in a total contact cast today. 10/22; small wound on the fifth plantar metatarsal head. He has completed antibiotics. He also has severe PAD which worries me about just about any wound on this man's foot 10/29; small superficial area on the fifth plantar metatarsal head. Measuring slightly smaller. He is not currently on any antibiotics. I been using a total contact cast in this man with very severe PAD but he seems to be tolerating this well 11/5; left plantar fifth metatarsal head. Silver alginate being used under a total contact cast 11/12; wound not much different than last week. Using a #15 scalpel debridement around the wound. Still silver collagen under a total contact cast. He has severe PAD and that may be playing a role in this 11/19; disappointing that the wound is not really changed that much. I think it  is come down in overall surface area because originally this was on the lateral part of the fifth metatarsal head however recently it is not really changed. Most of this is filled in but it will not epithelialized there is surface debris on  this which may be mostly related to ischemia. They have an appointment with Dr. Andree Elk on 12/9 09/24/2019 on evaluation today patient appears to be doing somewhat better with regard to the wound on the left fifth metatarsal head. Fortunately there does not appear to be any signs of active infection at this time. No fevers, chills, nausea, vomiting, or diarrhea. The wound does not appear to be completely closed and I do feel like the Hydrofera Blue is helping to some degree although I feel like the cast as well as keeping things from breaking down or getting any larger at least. As far as his wound overview it shows that the overall size of the wound is slightly smaller but really maintaining within the realm of about the same. He had no troubles with the cast which is good news. 12/1; left fifth metatarsal head. Perhaps somewhat more vibrant. We have been using Hydrofera Blue. He sees Dr. Andree Elk at [place] 1 week tomorrow. He be back next week and I hope to have a better idea which way the wound is going however I will not cast him next week in case Dr. Andree Elk wants to reevaluate things in his foot. The left leg is the leg with the stent in place and I wonder whether these are going to have to be reevaluated. 12/8; left fifth metatarsal head. Quite a bit better this week. Only a small open area remains. He sees Dr. Andree Elk at [facility] tomorrow. Dr. Andree Elk is previously done his vascular interventions. He has 2 stents in the left leg as I remember things. I therefore will not put him in a total contact cast today. He will be using a forefoot off loader. We will use silver collagen on the wound. We will see him again next week 12/15; left fifth metatarsal head. He saw Dr. Andree Elk and is scheduled for an angiogram on Thursday. Unfortunately although his wound was a lot better last week it is really deteriorated this week. Small punched-out hole with depth and overhanging tissue. We have been using Hydrofera  Blue 12/22; patient had an 80% stenosis proximal to the stent I believe in the below-knee popliteal artery. This was opened by Dr. Andree Elk. According the patient the stent itself was satisfactory. I have not been able to review this note. 12/29; patient arrives today with the wound about the same size some undermining medially. We have been using Hydrofera Blue under a total contact cast and that is what we will do again today 11/04/2019. Slightly smaller in orifice but about 4 mm undermining almost circumferentially. We have been using Hydrofera Blue. Apparently the Hydrofera Blue did not stay on the wound after we removed the cast. Some minor looking cast irritation on the right lateral 1/12; unfortunately things did not go well this week. Arrives in clinic today with a deeper wound with undermining more concerning a area of pus. Specimen was obtained for culture. We are not going to be able to put him in a cast today. 1/19; purulent material last week which I cultured showed Enterobacter cloacae. He was on ampicillin previously prescribed by his primary doctor which should not have covered this however mysteriously the wound looks somewhat better. There is less depth less erythema certainly no  drainage. He will need a course of ciprofloxacin which I will provide today. 1/26; he has completed the ciprofloxacin. I don't think he needs any additional antibiotics. The areas on the left fifth plantar met head. We've been using silver alginate Objective Constitutional Sitting or standing Blood Pressure is within target range for patient.. Pulse regular and within target range for patient.Marland Kitchen Respirations regular, non-labored and within target range.. Temperature is normal and within the target range for the patient.. Vitals Time Taken: 12:48 PM, Height: 69 in, Weight: 210 lbs, BMI: 31, Temperature: 98.8 F, Pulse: 79 bpm, Respiratory Rate: 16 breaths/min, Blood Pressure: 108/56 mmHg, Capillary Blood  Glucose: 160 mg/dl. General Notes: glucose per pt report Respiratory work of breathing is normal. General Notes: Wound exam; there is no purulent drainage. I did remove some thick skin from around the wound margin last time but it just seems to have reoccurred he has a small center shaped wound. There may be more depth here than is easily appreciated with no purulent drainage. Nothing needed culturing I elected not to do mechanical debridement on the Integumentary (Hair, Skin) No erythema around the wound bed. No purulent drainage. Wound #5 status is Open. Original cause of wound was Gradually Appeared. The wound is located on the Left Metatarsal head fifth. The wound measures 0.3cm length x 0.4cm width x 0.2cm depth; 0.094cm^2 area and 0.019cm^3 volume. There is Fat Layer (Subcutaneous Tissue) Exposed exposed. There is no tunneling or undermining noted. There is a small amount of serosanguineous drainage noted. The wound margin is well defined and not attached to the wound base. There is large (67-100%) pink, pale granulation within the wound bed. There is a small (1-33%) amount of necrotic tissue within the wound bed including Adherent Slough. Assessment Active Problems ICD-10 Type 2 diabetes mellitus with foot ulcer Type 2 diabetes mellitus with diabetic peripheral angiopathy without gangrene Non-pressure chronic ulcer of other part of left foot limited to breakdown of skin Cellulitis of left lower limb Plan Follow-up Appointments: Return Appointment in 1 week. Dressing Change Frequency: Wound #5 Left Metatarsal head fifth: Change dressing every day. Wound Cleansing: Wound #5 Left Metatarsal head fifth: May shower and wash wound with soap and water. Primary Wound Dressing: Wound #5 Left Metatarsal head fifth: Calcium Alginate with Silver Other: - pad right great toe for protection Secondary Dressing: Kerlix/Rolled Gauze Dry Gauze Off-Loading: Other: - Fore foot off  loader 1. I'm going to continue with silver alginate with padding over this area dry gauze. 2. I watched him walk out of the clinic with his forefoot offloading boot. He seems to do very well with this and it really looks like he was offloading this sufficiently. I didn't feel the need to consider a total contact cast 3. Is a virtual visit with Dr. Andree Elk office next week vein and vascular at [facility] 4. I'm going to see if this area will shut down. No debridement today Electronic Signature(s) Signed: 11/25/2019 5:32:11 PM By: [name] MD Entered By: [name] on 11/25/2019 13:37:27 -------------------------------------------------------------------------------- SuperBill Details Patient Name: Date of Service: [name], [name] 11/25/2019 Medical Record [id number] Patient Account Number: [id number] Date of Birth/Sex: Treating RN: 27-Nov-1945 (73 y.o. [name] Primary Care Provider: [name] Other Clinician: Referring Provider: Treating Provider/Extender:Hieu Herms, [name], [name] in Treatment: 17 Diagnosis Coding ICD-10 Codes Code Description E11.621 Type 2 diabetes mellitus with foot ulcer E11.51 Type 2 diabetes mellitus with diabetic peripheral angiopathy without gangrene L97.521 Non-pressure chronic ulcer of other part of left  foot limited to breakdown of skin L03.116 Cellulitis of left lower limb Facility Procedures CPT4 Code: [id number] Description: 48016 - WOUND CARE VISIT-LEV 3 EST PT Modifier: Quantity: 1 Physician Procedures CPT4 Code Description: [id number] - WC PHYS LEVEL 3 - EST PT ICD-10 Diagnosis Description L97.521 Non-pressure chronic ulcer of other part of left foot limit E11.621 Type 2 diabetes mellitus with foot ulcer Modifier: ed to breakdo Quantity: 1 wn of skin Electronic Signature(s) Signed: 11/25/2019 5:32:11 PM By: [name] MD Entered By: [name] on 11/25/2019 13:37:46

## 2019-12-01 NOTE — Progress Notes (Signed)
[name], [name] ([id number]) Visit Report for 11/25/2019 Arrival Information Details Patient Name: Date of Service: [name], [name] 11/25/2019 12:30 PM Medical Record [id number] Patient Account Number: [id number] Date of Birth/Sex: Treating RN: 1946/07/21 (74 y.o. [name] Primary Care Arnetta Odeh: [name] Other Clinician: Referring Keziah Drotar: Treating Shamina Etheridge/Extender:[name], [name], [name] in Treatment: 17 Visit Information History Since Last Visit Added or deleted any medications: No Patient Arrived: Ambulatory Any new allergies or adverse reactions: No Arrival Time: 12:45 Had a fall or experienced change in No Accompanied By: wife activities of daily living that may affect Transfer Assistance: None risk of falls: Patient Identification Verified: Yes Signs or symptoms of abuse/neglect since No Secondary Verification Process Yes last visito Completed: Hospitalized since last visit: No Patient Requires Transmission-Based No Implantable device outside of the clinic No Precautions: excluding Patient Has Alerts: No cellular tissue based products placed in the center since last visit: Has Dressing in Place as Prescribed: Yes Has Footwear/Offloading in Place as Yes Prescribed: Left: Wedge Shoe Pain Present Now: No Electronic Signature(s) Signed: 12/01/2019 6:39:44 PM By: Zandra Abts RN, BSN Entered By: Zandra Abts on 11/25/2019 12:46:09 -------------------------------------------------------------------------------- Clinic Level of Care Assessment Details Patient Name: Date of Service: [name], [name] 11/25/2019 12:30 PM Medical Record [id number] Patient Account Number: [id number] Date of Birth/Sex: Treating RN: 03-09-1946 (73 y.o. [name] [name] Primary Care Jacquel Mccamish: [name] Other Clinician: Referring Tanae Petrosky: Treating Marquon Alcala/Extender:[name], [name], [name] in Treatment: 17 Clinic Level of Care Assessment  Items TOOL 4 Quantity Score X - Use when only an EandM is performed on FOLLOW-UP visit 1 0 ASSESSMENTS - Nursing Assessment / Reassessment X - Reassessment of Co-morbidities (includes updates in patient status) 1 10 X - Reassessment of Adherence to Treatment Plan 1 5 ASSESSMENTS - Wound and Skin Assessment / Reassessment X - Simple Wound Assessment / Reassessment - one wound 1 5 []  - Complex Wound Assessment / Reassessment - multiple wounds 0 []  - Dermatologic / Skin Assessment (not related to wound area) 0 ASSESSMENTS - Focused Assessment []  - Circumferential Edema Measurements - multi extremities 0 []  - Nutritional Assessment / Counseling / Intervention 0 []  - Lower Extremity Assessment (monofilament, tuning fork, pulses) 0 []  - Peripheral Arterial Disease Assessment (using hand held doppler) 0 ASSESSMENTS - Ostomy and/or Continence Assessment and Care []  - Incontinence Assessment and Management 0 []  - Ostomy Care Assessment and Management (repouching, etc.) 0 PROCESS - Coordination of Care X - Simple Patient / Family Education for ongoing care 1 15 []  - Complex (extensive) Patient / Family Education for ongoing care 0 X - Staff obtains , Records, Test Results / Process Orders 1 10 []  - Staff telephones HHA, Nursing Homes / Clarify orders / etc 0 []  - Routine Transfer to another Facility (non-emergent condition) 0 []  - Routine Hospital Admission (non-emergent condition) 0 []  - New Admissions / / Ordering NPWT, Apligraf, etc. 0 []  - Emergency Hospital Admission (emergent condition) 0 X - Simple Discharge Coordination 1 10 []  - Complex (extensive) Discharge Coordination 0 PROCESS - Special Needs []  - Pediatric / Minor Patient Management 0 []  - Isolation Patient Management 0 []  - Hearing / Language / Visual special needs 0 []  - Assessment of Community assistance (transportation, D/C planning, etc.) 0 []  - Additional assistance / Altered mentation  0 []  - Support Surface(s) Assessment (bed, cushion, seat, etc.) 0 INTERVENTIONS - Wound Cleansing / Measurement X - Simple Wound Cleansing - one wound 1 5 []  - Complex Wound Cleansing -  multiple wounds 0 X - Wound Imaging (photographs - any number of wounds) 1 5 []  - Wound Tracing (instead of photographs) 0 X - Simple Wound Measurement - one wound 1 5 []  - Complex Wound Measurement - multiple wounds 0 INTERVENTIONS - Wound Dressings []  - Small Wound Dressing one or multiple wounds 0 X - Medium Wound Dressing one or multiple wounds 1 15 []  - Large Wound Dressing one or multiple wounds 0 X - Application of Medications - topical 1 5 []  - Application of Medications - injection 0 INTERVENTIONS - Miscellaneous []  - External ear exam 0 []  - Specimen Collection (cultures, biopsies, blood, body fluids, etc.) 0 []  - Specimen(s) / Culture(s) sent or taken to Lab for analysis 0 []  - Patient Transfer (multiple staff / Civil Service fast streamer / Similar devices) 0 []  - Simple Staple / Suture removal (25 or less) 0 []  - Complex Staple / Suture removal (26 or more) 0 []  - Hypo / Hyperglycemic Management (close monitor of Blood Glucose) 0 []  - Ankle / Brachial Index (ABI) - do not check if billed separately 0 X - Vital Signs 1 5 Has the patient been seen at the hospital within the last three years: Yes Total Score: 95 Level Of Care: New/Established - Level 3 Electronic Signature(s) Signed: 11/26/2019 7:19:07 AM By: [name] RN Entered By: [name] on 11/25/2019 13:18:36 -------------------------------------------------------------------------------- Encounter Discharge Information Details Patient Name: Date of Service: [name], [name] 11/25/2019 12:30 PM Medical Record [id number] Patient Account Number: [id number] Date of Birth/Sex: Treating RN: 05-07-1946 (74 y.o. [name] Primary Care Cerys Winget: [name] Other Clinician: Referring Wofford Stratton: Treating Daneisha Surges/Extender:[name],  [name], [name] in Treatment: 17 Encounter Discharge Information Items Discharge Condition: Stable Ambulatory Status: Ambulatory Discharge Destination: Home Transportation: Private Auto Accompanied By: wife Schedule Follow-up Appointment: Yes Clinical Summary of Care: Patient Declined Electronic Signature(s) Signed: 11/27/2019 7:44:20 AM By: Kela Millin Entered By: Kela Millin on 11/25/2019 13:34:00 -------------------------------------------------------------------------------- Lower Extremity Assessment Details Patient Name: Date of Service: [name], [name] 11/25/2019 12:30 PM Medical Record [id number] Patient Account Number: [id number] Date of Birth/Sex: Treating RN: Nov 04, 1945 (73 y.o. [name] Primary Care Conor Filsaime: [name] Other Clinician: Referring Felina Tello: Treating Annelisa Ryback/Extender:[name], [name], [name] in Treatment: 17 Edema Assessment Assessed: [Left: No] [Right: No] Edema: [Left: Ye] [Right: s] Calf Left: Right: Point of Measurement: cm From Medial Instep 39.5 cm cm Ankle Left: Right: Point of Measurement: cm From Medial Instep 29.5 cm cm Vascular Assessment Pulses: Dorsalis Pedis Palpable: [Left:No] Electronic Signature(s) Signed: 12/01/2019 6:39:44 PM By: Levan Hurst RN, BSN Entered By: Levan Hurst on 11/25/2019 12:54:03 -------------------------------------------------------------------------------- Multi Wound Chart Details Patient Name: Date of Service: [name], [name] 11/25/2019 12:30 PM Medical Record [id number] Patient Account Number: [id number] Date of Birth/Sex: Treating RN: 01-Sep-1946 (74 y.o. 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] Comorbid History: [5:Cataracts, Chronic sinus N/A problems/congestion, Coronary Artery Disease, Hypertension, Peripheral Arterial Disease, Type II Diabetes, Osteoarthritis, Neuropathy] Date Acquired: [5:07/14/2019] [N/A:N/A] Weeks of Treatment: [5:17] [N/A:N/A] Wound Status: [5:Open] [N/A:N/A] Measurements L x W x D 0.3x0.4x0.2 [N/A:N/A] (cm) Area (cm) : [5:0.094] [N/A:N/A] Volume (cm) : [5:0.019] [N/A:N/A] % Reduction in  Area: [5:-49.20%] [N/A:N/A] % Reduction in Volume: -46.20% [N/A:N/A] Classification: [5:Grade 1] [N/A:N/A] Exudate Amount: [5:Small] [N/A:N/A] Exudate Type: [5:Serosanguineous] [N/A:N/A] Exudate Color: [5:red, brown] [N/A:N/A] Wound Margin: [5:Well defined, not attached] [N/A:N/A] Granulation Amount: [5:Large (67-100%)] [N/A:N/A] Granulation Quality: [5:Pink, Pale] [N/A:N/A] Necrotic Amount: [5:Small (1-33%)] [N/A:N/A] Exposed Structures: [5:Fat Layer (Subcutaneous Tissue) Exposed: Yes Fascia: No Tendon: No Muscle: No Joint: No Bone: No Medium (34-66%)] [N/A:N/A N/A] Treatment Notes Electronic Signature(s) Signed: 11/25/2019 5:32:11 PM By: Baltazar Najjar MD Entered By: Baltazar Najjar on 11/25/2019 13:31:47 -------------------------------------------------------------------------------- Multi-Disciplinary Care Plan Details Patient Name: Date of Service: [name], [name] 11/25/2019 12:30 PM Medical Record [id number] Patient Account Number: [id number] Date of Birth/Sex: Treating RN: 01-29-1946 (73 y.o. [name] Primary Care Emitt Maglione: [name] Other Clinician: Referring Terriyah Westra: Treating Meisha Salone/Extender:[name], [name], [name] in Treatment: 17 Active  Inactive Wound/Skin Impairment Nursing Diagnoses: Knowledge deficit related to ulceration/compromised skin integrity Goals: Patient/caregiver will verbalize understanding of skin care regimen Date Initiated: 07/29/2019 Target Resolution Date: 12/05/2019 Goal Status: Active Ulcer/skin breakdown will have a volume reduction of 30% by week 4 Target Resolution Date Initiated: 07/29/2019 Date Inactivated: 10/21/2019 Date: 10/10/2019 Unmet Reason: comorbities. Goal Status: Unmet blood flow Ulcer/skin breakdown will have a volume reduction of 50% by week 8 Date Initiated: 10/21/2019 Target Resolution Date: 11/21/2019 Goal Status: Active Interventions: Assess patient/caregiver ability to obtain necessary supplies Assess patient/caregiver ability to perform ulcer/skin care regimen upon admission and as needed Assess ulceration(s) every visit Notes: Electronic Signature(s) Signed: 11/26/2019 7:19:07 AM By: [name] RN Entered By: [name] on 11/25/2019 13:12:41 -------------------------------------------------------------------------------- Pain Assessment Details Patient Name: Date of Service: [name], [name] 11/25/2019 12:30 PM Medical Record [id number] Patient Account Number: [id number] Date of Birth/Sex: Treating RN: July 19, 1946 (74 y.o. [name] Primary Care Nguyen Todorov: [name] Other Clinician: Referring Javonn Gauger: Treating Cristina Ceniceros/Extender:[name], [name], [name] in Treatment: 17 Active Problems Location of Pain Severity and Description of Pain Patient Has Paino No Site Locations Pain Management and Medication Current Pain Management: Electronic Signature(s) Signed: 12/01/2019 6:39:44 PM By: Zandra Abts RN, BSN Entered By: Zandra Abts on 11/25/2019 12:49:28 -------------------------------------------------------------------------------- Patient/Caregiver Education Details Patient Name: Date of Service: [name]  1/26/2021andnbsp12:30 PM Medical Record Patient Account Number: [id number] Number: Treating RN: [name] [birth year] (73 y.o. Other Clinician: Date of Birth/Gender: M) Treating Baltazar Najjar Primary Care Physician: [name] Physician/Extender: Referring Physician: Joaquin Bend in Treatment: 17 Education Assessment Education Provided To: Patient Education Topics Provided Wound/Skin Impairment: Methods: Explain/Verbal Responses: State content correctly Electronic Signature(s) Signed: 11/26/2019 7:19:07 AM By: [name] RN Entered By: [name] on 11/25/2019 13:12:57 -------------------------------------------------------------------------------- Wound Assessment Details Patient Name: Date of Service: [name], [name] 11/25/2019 12:30 PM Medical Record [id number] Patient Account Number: [id number] Date of Birth/Sex: Treating RN: Jun 29, 1946 (73 y.o. M) Primary Care Shadiamond Koska: [name] Other Clinician: Referring Lisaanne Lawrie: Treating Larell Baney/Extender:[name], [name], [name] in Treatment: 17 Wound Status Wound Number: 5 Primary Diabetic Wound/Ulcer of the Lower Extremity Etiology: Wound Location: Left Metatarsal head fifth Wound Open Wounding Event: Gradually Appeared Status: Date Acquired: 07/14/2019 Comorbid Cataracts, Chronic sinus problems/congestion, Weeks Of Treatment: 17 History: Coronary Artery Disease, Hypertension, Clustered Wound: No Peripheral Arterial Disease, Type II Diabetes, Osteoarthritis, Neuropathy Photos Wound Measurements Length: (cm) 0.3 Width: (cm) 0.4 Depth: (cm) 0.2 Area: (cm) 0.094 Volume: (cm) 0.019 Wound Description Classification: Grade 1 Wound Margin: Well defined, not attached Exudate Amount: Small Exudate Type: Serosanguineous Exudate Color: red, brown Wound Bed Granulation Amount: Large (67-100%) Granulation Quality: Pink, Pale Necrotic Amount: Small (1-33%)  Necrotic Quality:  Adherent Slough fter Cleansing: No ino Yes Exposed Structure ed: No ubcutaneous Tissue) Exposed: Yes ed: No ed: No d: No : No % Reduction in Area: -49.2% % Reduction in Volume: -46.2% Epithelialization: Medium (34-66%) Tunneling: No Undermining: No Foul Odor A Slough/Fibr Fascia Expos Fat Layer (S Tendon Expos Muscle Expos Joint Expose Bone Exposed Treatment Notes Wound #5 (Left Metatarsal head fifth) 1. Cleanse With Wound Cleanser 2. Periwound Care Skin Prep 3. Primary Dressing Applied Calcium Alginate Ag 4. Secondary Dressing Foam 5. Secured With Tape Notes Insurance claims handler) Signed: 11/26/2019 4:33:34 PM By: Benjaman Kindler EMT/HBOT Entered By: Benjaman Kindler on 11/26/2019 15:09:58 -------------------------------------------------------------------------------- Vitals Details Patient Name: Date of Service: SHAKA, CARDIN 11/25/2019 12:30 PM Medical Record [id number] Patient Account Number: [id number] Date of Birth/Sex: Treating RN: 1946/05/04 (74 y.o. [name] Primary Care Shawni Volkov: [name] Other Clinician: Referring Carrera Kiesel: Treating Catrell Morrone/Extender:[name], [name], [name] in Treatment: 17 Vital Signs Time Taken: 12:48 Temperature (F): 98.8 Height (in): 69 Pulse (bpm): 79 Weight (lbs): 210 Respiratory Rate (breaths/min): 16 Body Mass Index (BMI): 31 Blood Pressure (mmHg): 108/56 Capillary Blood Glucose (mg/dl): 080 Reference Range: 80 - 120 mg / dl Notes glucose per pt report Electronic Signature(s) Signed: 12/01/2019 6:39:44 PM By: Zandra Abts RN, BSN Entered By: Zandra Abts on 11/25/2019 12:49:16

## 2019-12-02 ENCOUNTER — Encounter (HOSPITAL_BASED_OUTPATIENT_CLINIC_OR_DEPARTMENT_OTHER): Payer: Medicare Other | Admitting: Internal Medicine

## 2019-12-02 ENCOUNTER — Other Ambulatory Visit: Payer: Self-pay

## 2019-12-02 DIAGNOSIS — L97521 Non-pressure chronic ulcer of other part of left foot limited to breakdown of skin: Secondary | ICD-10-CM | POA: Diagnosis not present

## 2019-12-02 DIAGNOSIS — E1151 Type 2 diabetes mellitus with diabetic peripheral angiopathy without gangrene: Secondary | ICD-10-CM | POA: Insufficient documentation

## 2019-12-02 DIAGNOSIS — E11621 Type 2 diabetes mellitus with foot ulcer: Secondary | ICD-10-CM | POA: Insufficient documentation

## 2019-12-02 DIAGNOSIS — I10 Essential (primary) hypertension: Secondary | ICD-10-CM | POA: Diagnosis not present

## 2019-12-02 DIAGNOSIS — L03116 Cellulitis of left lower limb: Secondary | ICD-10-CM | POA: Diagnosis not present

## 2019-12-02 DIAGNOSIS — Z7902 Long term (current) use of antithrombotics/antiplatelets: Secondary | ICD-10-CM | POA: Diagnosis not present

## 2019-12-02 DIAGNOSIS — Z8614 Personal history of Methicillin resistant Staphylococcus aureus infection: Secondary | ICD-10-CM | POA: Insufficient documentation

## 2019-12-02 DIAGNOSIS — M1990 Unspecified osteoarthritis, unspecified site: Secondary | ICD-10-CM | POA: Insufficient documentation

## 2019-12-02 DIAGNOSIS — E1140 Type 2 diabetes mellitus with diabetic neuropathy, unspecified: Secondary | ICD-10-CM | POA: Insufficient documentation

## 2019-12-02 DIAGNOSIS — I2510 Atherosclerotic heart disease of native coronary artery without angina pectoris: Secondary | ICD-10-CM | POA: Insufficient documentation

## 2019-12-02 DIAGNOSIS — Z794 Long term (current) use of insulin: Secondary | ICD-10-CM | POA: Diagnosis not present

## 2019-12-02 DIAGNOSIS — Z7982 Long term (current) use of aspirin: Secondary | ICD-10-CM | POA: Insufficient documentation

## 2019-12-02 NOTE — Progress Notes (Addendum)
[name] ([id number]) Visit Report for 12/02/2019 Arrival Information Details Patient Name: Date of Service: [name] 12/02/2019 12:30 PM Medical Record Number: [id number] Patient Account Number: [id number] Date of Birth/Sex: Treating RN: 17-Mar-1946 (74 y.o. [name] Primary Care [name]: [name] Other Clinician: Referring [name] [name] in Treatment: 18 Visit Information History Since Last Visit Added or deleted any medications: No Patient Arrived: Ambulatory Any new allergies or adverse reactions: No Arrival Time: 12:35 Had a fall or experienced change in No Accompanied By: spouse activities of daily living that may affect Transfer Assistance: None risk of falls: Patient Identification Verified: Yes Signs or symptoms of abuse/neglect since last visito No Secondary Verification Process Completed: Yes Hospitalized since last visit: No Patient Requires Transmission-Based Precautions: No Implantable device outside of the clinic excluding No Patient Has Alerts: No cellular tissue based products placed in the center since last visit: Has Dressing in Place as Prescribed: Yes Has Footwear/Offloading in Place as Prescribed: Yes Left: Wedge Shoe Pain Present Now: No Electronic Signature(s) Signed: 12/02/2019 5:43:30 PM By: [name] Entered By: Todd Deed on 12/02/2019 12:38:56 -------------------------------------------------------------------------------- Encounter Discharge Information Details Patient Name: Date of Service: [name], [name] YE 12/02/2019 12:30 PM Medical Record Number: [id number] Patient Account Number: [id number] Date of Birth/Sex: Treating RN: 03-14-1946 (73 y.o. [name] Primary Care [name]: [name] Other Clinician: Referring [name]: Guzman [name] in Treatment: 18 Encounter Discharge  Information Items Post Procedure Vitals Discharge Condition: Stable Temperature (F): 98.5 Ambulatory Status: Ambulatory Pulse (bpm): 74 Discharge Destination: Home Respiratory Rate (breaths/min): 18 Transportation: Private Auto Blood Pressure (mmHg): 126/75 Accompanied By: wife Schedule Follow-up Appointment: Yes Clinical Summary of Care: Patient Declined Electronic Signature(s) Signed: 12/02/2019 4:57:44 PM By: [name] Entered By: [name] on 12/02/2019 14:23:53 -------------------------------------------------------------------------------- Lower Extremity Assessment Details Patient Name: Date of Service: [name] YE 12/02/2019 12:30 PM Medical Record Number: [id number] Patient Account Number: [id number] Date of Birth/Sex: Treating RN: 18-May-1946 (74 y.o. [name] Primary Care Sundai Probert: [name] Other Clinician: Referring [name]: Guzman [name] in Treatment: 18 Edema Assessment Assessed: [name]: No] [name]: No] Edema: [Left: Ye] [Guzman: s] Calf Left: Guzman: Point of Measurement: cm From Medial Instep 39.5 cm cm Ankle Left: Guzman: Point of Measurement: cm From Medial Instep 30 cm cm Vascular Assessment Pulses: Dorsalis Pedis Palpable: [Left:Yes] Electronic Signature(s) Signed: 12/02/2019 5:43:30 PM By: [name] Entered By: Todd Deed on 12/02/2019 12:43:45 -------------------------------------------------------------------------------- Multi Wound Chart Details Patient Name: Date of Service: [name], [name] YE 12/02/2019 12:30 PM Medical Record Number: [id number] Patient Account Number: [id number] Date of Birth/Sex: Treating RN: January 06, 1946 (73 y.o. [name]) [name] Primary Care [name]: [name] Other Clinician: Referring [name]: Guzman [name] in Treatment: 18 Vital Signs Height(in): 69 Capillary Blood  Glucose(mg/dl): 592 Weight(lbs): 924 Pulse(bpm): 74 Body Mass Index(BMI): 31 Blood Pressure(mmHg): 126/75 Temperature(F): 98.5 Respiratory Rate(breaths/min): 18 Photos: [5:No Photos Left Metatarsal head fifth] [N/A:N/A N/A] Wound Location: [5:Gradually Appeared] [N/A:N/A] Wounding Event: [5:Diabetic Wound/Ulcer of the Lower] [N/A:N/A] Primary Etiology: [5:Extremity Cataracts, Chronic sinus] [N/A:N/A] Comorbid History: [5:problems/congestion, Coronary Artery Disease, Hypertension, Peripheral Arterial Disease, Type II Diabetes, Osteoarthritis, Neuropathy 07/14/2019] [N/A:N/A] Date Acquired: [5:18] [N/A:N/A] Weeks of Treatment: [5:Open] [N/A:N/A] Wound Status: [5:0.4x0.4x0.2] [N/A:N/A] Measurements L x W x D (cm) [5:0.126] [N/A:N/A] A (cm) : rea [5:0.025] [N/A:N/A] Volume (cm) : [5:-100.00%] [N/A:N/A] % Reduction in A rea: [5:-92.30%] [  N/A:N/A] % Reduction in Volume: [5:12] Starting Position 1 (o'clock): [5:12] Ending Position 1 (o'clock): [5:0.4] Maximum Distance 1 (cm): [5:Yes] [N/A:N/A] Undermining: [5:Grade 1] [N/A:N/A] Classification: [5:Small] [N/A:N/A] Exudate A mount: [5:Serosanguineous] [N/A:N/A] Exudate Type: [5:red, brown] [N/A:N/A] Exudate Color: [5:Well defined, not attached] [N/A:N/A] Wound Margin: [5:Large (67-100%)] [N/A:N/A] Granulation A mount: [5:Red, Friable] [N/A:N/A] Granulation Quality: [5:None Present (0%)] [N/A:N/A] Necrotic A mount: [5:Fat Layer (Subcutaneous Tissue)] [N/A:N/A] Exposed Structures: [5:Exposed: Yes Fascia: No Tendon: No Muscle: No Joint: No Bone: No None] [N/A:N/A] Epithelialization: [5:Debridement - Selective/Open Wound] [N/A:N/A] Debridement: Pre-procedure Verification/Time Out 13:18 [N/A:N/A] Taken: [5:Lidocaine 5% topical ointment] [N/A:N/A] Pain Control: [5:Callus] [N/A:N/A] Tissue Debrided: [5:Skin/Epidermis] [N/A:N/A] Guzman: [5:0.16] [N/A:N/A] Debridement A (sq cm): [5:rea Blade] [N/A:N/A] Instrument: [5:Moderate]  [N/A:N/A] Bleeding: [5:Silver Nitrate] [N/A:N/A] Hemostasis A chieved: [5:0] [N/A:N/A] Procedural Pain: [5:0] [N/A:N/A] Post Procedural Pain: [5:Procedure was tolerated well] [N/A:N/A] Debridement Treatment Response: [5:0.4x0.4x0.2] [N/A:N/A] Post Debridement Measurements L x W x D (cm) [5:0.025] [N/A:N/A] Post Debridement Volume: (cm) [5:Debridement] [N/A:N/A] Treatment Notes Electronic Signature(s) Signed: 12/02/2019 5:22:39 PM By: [name] MD Signed: 12/02/2019 5:23:18 PM By: [name] RN Entered By: [name] on 12/02/2019 13:50:33 -------------------------------------------------------------------------------- Multi-Disciplinary Care Plan Details Patient Name: Date of Service: Ileana Roup, [facility] 12/02/2019 12:30 PM Medical Record Number: [id number] Patient Account Number: [id number] Date of Birth/Sex: Treating RN: [birth year] (73 y.o. [name] Primary Care Prisma Decarlo: [name] Other Clinician: Referring Aniyla Harling: Guzman Muslima Toppins/Extender: Leonette Nutting in Treatment: 18 Active Inactive Wound/Skin Impairment Nursing Diagnoses: Knowledge deficit related to ulceration/compromised skin integrity Goals: Patient/caregiver will verbalize understanding of skin care regimen Date Initiated: 07/29/2019 Target Resolution Date: 12/05/2019 Goal Status: Active Ulcer/skin breakdown will have a volume reduction of 30% by week 4 Date Initiated: 07/29/2019 Date Inactivated: 10/21/2019 Target Resolution Date: 10/10/2019 Goal Status: Unmet Unmet Reason: comorbities. blood flow Ulcer/skin breakdown will have a volume reduction of 50% by week 8 Date Initiated: 10/21/2019 Date Inactivated: 12/02/2019 Target Resolution Date: 11/21/2019 Goal Status: Unmet Unmet Reason: comorbities Ulcer/skin breakdown will have a volume reduction of 80% by week 12 Date Initiated: 12/02/2019 Target Resolution Date: 01/02/2020 Goal Status: Active Interventions: Assess  patient/caregiver ability to obtain necessary supplies Assess patient/caregiver ability to perform ulcer/skin care regimen upon admission and as needed Assess ulceration(s) every visit Notes: Electronic Signature(s) Signed: 12/02/2019 5:23:18 PM By: [name] RN Entered By: [name] on 12/02/2019 13:10:22 -------------------------------------------------------------------------------- Pain Assessment Details Patient Name: Date of Service: [name] YE 12/02/2019 12:30 PM Medical Record Number: [id number] Patient Account Number: [id number] Date of Birth/Sex: Treating RN: [birth year] (74 y.o. [name] Primary Care Eun Vermeer: [name] Other Clinician: Referring Kelci Petrella: Guzman Evelyn Aguinaldo/Extender: Leonette Nutting in Treatment: 18 Active Problems Location of Pain Severity and Description of Pain Patient Has Paino No Site Locations Rate the pain. Current Pain Guzman: 0 Pain Management and Medication Current Pain Management: Electronic Signature(s) Signed: 12/02/2019 5:43:30 PM By: Baruch Gouty RN, Guzman Entered By: Baruch Gouty on 12/02/2019 12:41:20 -------------------------------------------------------------------------------- Patient/Caregiver Education Details Patient Name: Date of Service: [name] YE 2/2/2021andnbsp12:30 PM Medical Record Number: [id number] Patient Account Number: [id number] Date of Birth/Gender: Treating RN: 1946/08/18 (73 y.o. [name] Primary Care Physician: [name] Other Clinician: Referring Physician: Treating Physician/Extender: Leonette Nutting in Treatment: 18 Education Assessment Education Provided To: Patient Education Topics Provided Wound/Skin Impairment: Methods: Explain/Verbal Responses: State content correctly Electronic Signature(s) Signed: 12/02/2019 5:23:18 PM By: [name] RN Entered By: [name] on 12/02/2019  13:10:37 -------------------------------------------------------------------------------- Wound  Assessment Details Patient Name: Date of Service: [name] 12/02/2019 12:30 PM Medical Record Number: [id number] Patient Account Number: [id number] Date of Birth/Sex: Treating RN: May 12, 1946 (73 y.o. [name]) [name] Primary Care Ami Thornsberry: [name] Other Clinician: Referring Shazia Mitchener: Guzman Ryhanna Dunsmore/Extender: [name] in Treatment: 18 Wound Status Wound Number: 5 Primary Diabetic Wound/Ulcer of the Lower Extremity Etiology: Wound Location: Left Metatarsal head fifth Wound Open Wounding Event: Gradually Appeared Status: Date Acquired: 07/14/2019 Comorbid Cataracts, Chronic sinus problems/congestion, Coronary Artery Weeks Of Treatment: 18 History: Disease, Hypertension, Peripheral Arterial Disease, Type II Clustered Wound: No Diabetes, Osteoarthritis, Neuropathy Photos Wound Measurements Length: (cm) 0.4 Width: (cm) 0.4 Depth: (cm) 0.2 Area: (cm) 0.126 Volume: (cm) 0.025 % Reduction in Area: -100% % Reduction in Volume: -92.3% Epithelialization: None Tunneling: No Undermining: Yes Starting Position (o'clock): 12 Ending Position (o'clock): 12 Maximum Distance: (cm) 0.4 Wound Description Classification: Grade 1 Wound Margin: Well defined, not attached Exudate Amount: Small Exudate Type: Serosanguineous Exudate Color: red, brown Foul Odor After Cleansing: No Slough/Fibrino Yes Wound Bed Granulation Amount: Large (67-100%) Exposed Structure Granulation Quality: Red, Friable Fascia Exposed: No Necrotic Amount: None Present (0%) Fat Layer (Subcutaneous Tissue) Exposed: Yes Tendon Exposed: No Muscle Exposed: No Joint Exposed: No Bone Exposed: No Electronic Signature(s) Signed: 12/09/2019 4:28:10 PM By: Benjaman Kindler EMT/HBOT Signed: 04/27/2020 5:26:37 PM By: [name] RN Previous Signature: 12/02/2019 5:43:30 PM Version By:  [name] Entered By: Benjaman Kindler on 12/09/2019 15:17:36 -------------------------------------------------------------------------------- Vitals Details Patient Name: Date of Service: [name], [name] YE 12/02/2019 12:30 PM Medical Record Number: [id number] Patient Account Number: [id number] Date of Birth/Sex: Treating RN: January 09, 1946 (74 y.o. [name] Primary Care Ayame Rena: [name] Other Clinician: Referring Arriyanna Mersch: Guzman [name] [name]/Extender: [name] in Treatment: 18 Vital Signs Time Taken: 12:39 Temperature (F): 98.5 Height (in): 69 Pulse (bpm): 74 Source: Stated Respiratory Rate (breaths/min): 18 Weight (lbs): 210 Blood Pressure (mmHg): 126/75 Source: Stated Capillary Blood Glucose (mg/dl): 671 Body Mass Index (BMI): 31 Reference Range: 80 - 120 mg / dl Notes glucose per pt report this am Electronic Signature(s) Signed: 12/02/2019 5:43:30 PM By: [name] Entered By: Todd Deed on 12/02/2019 12:39:35

## 2019-12-02 NOTE — Progress Notes (Signed)
MAXUM, CASSARINO ([id number]) Visit Report for 12/02/2019 Debridement Details Patient Name: Date of Service: [name], [name] 12/02/2019 12:30 PM Medical Record [id number] Patient Account Number: [id number] Date of Birth/Sex: 1946/09/16 (74 y.o. M) Treating RN: [name] Primary Care Provider: [name] Other Clinician: Referring Provider: Treating Provider/Extender:Atiyana Welte, [name], [name] in Treatment: 18 Debridement Performed for Wound #5 Left Metatarsal head fifth Assessment: Performed By: Physician Ricard Dillon., MD Debridement Type: Debridement Severity of Tissue Pre Fat layer exposed Debridement: Level of Consciousness (Pre- Awake and Alert procedure): Pre-procedure Verification/Time Out Taken: Yes - 13:18 Start Time: 13:18 Pain Control: Lidocaine 5% topical ointment Total Area Debrided (L x W): 0.4 (cm) x 0.4 (cm) = 0.16 (cm) Tissue and other material Viable, Non-Viable, Callus, Skin: Dermis , Skin: Epidermis debrided: Level: Skin/Epidermis Debridement Description: Selective/Open Wound Instrument: Blade Bleeding: Moderate Hemostasis Achieved: Silver Nitrate End Time: 13:21 Procedural Pain: 0 Post Procedural Pain: 0 Response to Treatment: Procedure was tolerated well Level of Consciousness Awake and Alert (Post-procedure): Post Debridement Measurements of Total Wound Length: (cm) 0.4 Width: (cm) 0.4 Depth: (cm) 0.2 Volume: (cm) 0.025 Character of Wound/Ulcer Post Improved Debridement: Severity of Tissue Post Debridement: Fat layer exposed Post Procedure Diagnosis Same as Pre-procedure Electronic Signature(s) Signed: 12/02/2019 5:22:39 PM By: [name] MD Signed: 12/02/2019 5:23:18 PM By: [name] RN Entered By: [name] on 12/02/2019 13:52:12 -------------------------------------------------------------------------------- HPI Details Patient Name: Date of Service: [name], [name] 12/02/2019 12:30 PM Medical Record  [id number] Patient Account Number: [id number] Date of Birth/Sex: Treating RN: 23-May-1946 (73 y.o. [name] Primary Care Provider: [name] Other Clinician: Referring Provider: Treating Provider/Extender:Courtney Bellizzi, [name], [name] in Treatment: 18 History of Present Illness HPI Description: 05/18/16; this is a 74year-old diabetic who is a type II diabetic on insulin. The history is that he traumatized his right foot developed a sore sometime in late March. 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 According to the patient and his wife this wound never really healed. He was seen on 2 occasions in the wound care center in [place] and had vascular studies and then was referred urgently to Dr. Bridgett Larsson of vascular surgery. He underwent an angiogram on 05/10/16. Unfortunately nothing really could be done to improve his vascular status. He had a 75-90% stenosis in the midsegment of 1 segment of the posterior femoral artery. He had a patent popliteal, his anterior tibial occluded shortly after takeoff. Perineal had a greater than 90% stenosis posterior tibial is occluded feet had no distal collaterals feed distal aspect of the transmetatarsal amputation site. The patient tells me that he had a prolonged period of Santyl by Dr. Doran Durand was some initial improvement but then this was stopped. I think they're only applying daily dressings/dry dressings. He has not had a recent x-ray of the right foot he did have one before his surgery in April. His wife by the dimensions of the wound/surgical site being followed at home since 4/20. At that point the dimensions were 0.5 x 12 x 0.2 on 7/19 this was 1.8 x 6 x 0.4. He is not currently on any antibiotics. His  hemoglobin A1c in early April was 12.9 at that point he was started on insulin. Apparently his blood sugars are much lower he has an appointment with  Dr. Legrand Como Alteimer of endocrine next week. 05/29/16 x-ray of the area did not show osteomyelitis. I think he probably needs an MRI at this point. His wife is asking about something called"Yireh" cream which is not FDA approved. I have not heard of this. 06/22/16; MRI did not really suggest osteomyelitis. There was minimal marrow edema and enhancement in the stump of the second metatarsal felt to be secondary likely to postoperative change rather than osteomyelitis. The patient has arranged his own consultation with Dr. Andree Elk at [facility], apparently their daughter lives in [place] and has some connection here. Any improvement in vascular supply by Dr. Andree Elk would of course be helpful. Dr. Bridgett Larsson did not feel that anything further could be done other than amputation if wound care did not result in healing or if the area deteriorates. 06/26/16; the patient has been to see Dr. Andree Elk at [place] and had an angiogram. He is going for a procedure on Thursday which will involve catheterization. I'm not sure if this is an anterograde or retrograde approach. He has been using Santyl to the wound 07/10/16; the patient had a repeat angiogram and angioplasty at [facility] by Dr. Brunetta Jeans. His angiogram showed right CFA and profundal widely patent. The right as of a.m. popliteal artery were widely patent the right anterior tibial was occluded proximally and reconstitutes at the ankle. Peroneal artery was patent to the foot. Posterior tibial artery was occluded. The patient had angioplasty of the anterior tibial artery.. This was quite successful. He was recommended for Plavix as well as aspirin. 07/17/16; the patient was close to be a nurse visit today however the outer dressing of the Apligraf fell off. Noted drainage. I was asked to see the wound. The patient is noted an odor  however his wife had noted that. Drainage with Apligraf not necessarily a bad thing. He has not been systemically unwell 07/24/16; we are still have an issue with drainage of this wound. In spite of this I applied his second Apligraf. Medially the area still is probing to bone. 08/07/16; Apligraf reapplied in general wound looks improved. 08/21/16 Apligraf #4. Wound looks much better 09/04/16 patientt's wound again today continues to appear to improve with the application of the Apligraf's. He notes no increased discomfort or concerns at this point in time. 09/18/16; the patient returns today 2 weeks after his fifth application of Apligraf. Predictably three quarters of the width of this wound has healed. The deep area that probe to bone medially is still open. The patient asked how much out-of-pocket dollars would be for additional Apligraf's. 09/25/16; now using Hydrofera Blue. He has completed 5 Apligraf applications with considerable improvement in this deep open transmetatarsal amputation site. His wound is now a triangular-shaped wound on the medial aspect. At roughly 12 to 2:00 this probes another centimeter but as opposed to in the past this does not probe to bone. The patient has been seen at [facility] by Dr. Zenia Resides. He is not planning to do any more revascularization unless the wound stalls or worsens per the patient 10/02/16; 0.7 x 0.8 x 0.8. Unfortunately although the wound looks stable to improved. There is now easily probable bone. This hasn't been present for several weeks. Patient is not otherwise symptomatic he is not experiencing any pain. I did a culture of the wound bed 10/09/16. Deterioration last week. Culture grew MRSA and although there is improvement here with doxycycline prescribed over the phone I'm going to try to get him linezolid 600 twice a day  for 10 days today. 10/16/16; he is completing a weeks worth of linezolid and still has 3 more days to go. Small triangular-shaped  open area with some degree of undermining. There is still palpable bone with a curet. Overall the area appears better than last week 10/20/16 he has completed the linezolid still having some nausea and vomiting but no diarrhea. He has exposed bone this week which is a deterioration. 10/27/16 patient now has a small but probing wound down to bone. Culture of this bone that I did last week showed a few methicillin-resistant staph aureus. I have little doubt that this represents acute/subacute osteomyelitis. The patient is currently on Doxy which I will continue he also completed 10 days of linezolid. We are now in a difficult situation with this patient's foot after considerable discussion we will send him back to see Dr. Doran Durand for a surgical opinion of this I'm also going to try to arrange a infectious disease consult at [facility] hopefully week and get this prior to her usual 4-6 weeks we having [place]. The patient clearly is going to need 6 weeks of IV vancomycin. If we cannot arrange this expediently I'll have to consider ordering this myself through a home infusion company 11/03/16; the patient now has a small in terms of circumference but probing wound. No bone palpable today. The patient remains on doxycycline 100 twice a day which should support him until he sees infectious disease at [facility] next week the following week on Wednesday I believe he has an appointment with Dr. Andree Elk at [facility] who is his vascular cardiologist. Finally he has an appointment with Dr. Doran Durand on 11/22/16 we have been using silver alginate. The patient's wife states they are having trouble getting this through [employer] 11/13/16; the patient was seen by infectious disease at [facility] in the 11th PICC line placed in preparation for IV antibiotics. A tummy he has not going to get IV vancomycin o Ceftaroline. They've also ordered an MRI. Patient has a follow-up with Dr. Doran Durand on 11/22/16 and Dr. Andree Elk at [facility]  tomorrow 11/23/16 the patient is on daptomycin as directed by infectious disease at [facility]. He is also been back to see Dr. Andree Elk at [facility]. He underwent a repeat arteriogram. He had a successful PTA of the right anterior tibial artery. He is on dual antiplatelete treatment with Plavix and aspirin. Finally he had the MRI of his foot in [place]. This showed cellulitis about the foot worse distally edema and enhancement in the reminiscent of the second metatarsal was consistent with osteomyelitis therefore what I was assuming to be the first metatarsal may be actually the second. He also has a fluid collection deep to the calcaneus at the level of the calcaneal spur which could be an abscess or due to adventitial bursitis. He had a small tear in his Achilles 11/30/16; the patient continues on daptomycin as directed by infectious disease at [facility]. He is been revascularized by Dr. Andree Elk at [facility] in [place]. He has been to see Gretta Arab who was the orthopedic surgeon who did his original amputation. I have not seen his not however per the patient's wife he did not offer another surgical local surgical prodecure to remove involved bone. He verbalized his usual disbelief in not just hyperbarics but any medical therapy for this condition(osteomyelitis). I discussed this in detail with the patient today including answering the question about a BKA definitively "curing" the current condition. 12/07/16; the patient continues on daptomycin as directed  by infectious disease at [facility]. This is directed at the MRSA that we cultured from his bone debridement from 12/22. Lab work today shows a white count of 8.7 hemoglobin of 10.5 which is microcytic and hypochromic differential count shows a slightly elevated monocyte count at 1.2 eosinophilic count of 0.6. His creatinine is 1.11 sedimentation rate apparently is gone from 35-34 now 40. I explained was wife I don't think this represents a trend. His  total CK is 48 12/14/16- patient is here for follow-up evaluation of his right TMA site. He continues to receive IV daptomycin per infectious disease. His serum inflammatory markers remain elevated. He complains of intermittent pain to the medial aspect of the TMA site with intermittent erythema. He voices no complaints or concerns regarding hyperbaric therapy. Overall he and his wife are expressing a frustration and discouragement regarrding the length of time of treatment. 12/21/16; small open wound at roughly the first or second metatarsal metatarsalphalyngeal joint reminiscence of his transmetatarsal amputation site he continues to receive IV daptomycin per infectious disease at [facility]. He will finish these a week tomorrow. He has lab work which I been copied on. His white count is 10.8 hemoglobin 8.9 MCV is low at 75., MCH low at 24.5 platelet count slightly elevated at 626. Differential count shows 70% neutrophils 10% monocytes and 10% eosinophils. His comprehensive metabolic panel shows a slightly low sodium at 133 albumin low at 3.1 total CK is normal at 42 sedimentation rate is much higher at 82. He has had iron studies that show a serum iron of 15 and iron binding capacity of 238 and iron saturation of 6. This is suggestive of iron deficiency. B12 and folate were normal ferritin at 113 The patient tells me that he is not eating well and he has lost weight. He feels episodically nauseated. He is coughing and gagging on mucus which she thinks is sinusitis. He has an appointment with his primary doctor at 5:00 this afternoon in [facility] 01/02/17; the patient developed a subacute pneumonitis. He was admitted to [facility] after a CT scan showed an extensive interstitial pneumonitis [I have not yet seen this]. 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 He has since been discharged. He  is not on oxygen. He feels fatigued and very short of breath with exertion. At [facility] the wound care nurse there felt that his wound was healed. He did complete his daptomycin and has follow-up with infectious disease on Friday. X-rays I did before he went to [facility] still suggested residual osteomyelitis in the anterior aspect of the second must metatarsal head. I'm not sure I would've expected any different. There was no other findings. I actually think I did this because of erythema over the first metatarsal head reminiscent 01/11/17; the patient has eosinophilic pneumonitis. He has been reviewed by pulmonology and given clearance for hyperbarics at least that's what his wife says. I'll need to see if there is note in care everywhere. Apparently the prognosis for improvement of daptomycin induced eosinophilic granulocyte is is 3 months without steroids. In the meantime infectious disease has placed him on doxycycline until the wound is closed. He is still is lost a lot of weight and his blood sugars are running in the mid 60s to low 80s fasting and at all times during the day 01/18/17; he has had adjustments in his insulin apparently his blood sugars in the morning or over 100.  He wants to restart his hyperbaric treatment we'll do this at 1:00. 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 I think there is good reason to complete his treatments in order to give him the best chance of maintaining a healed status and these DFU 3 wounds with MRSA infection in the bone 02/15/17; the patient was seen today in conjunction with HBO. He completed hyperbaric oxygen today. The open area on his transmetatarsal site has remained closed. There was an area of erythema when I saw him earlier in the week on the posterior heel although that is resolved as of today as well. He has been using a cam walker. This is a patient who came  to [place] after a transmetatarsal amputation that was necrotic and dehisced. He required revascularization percutaneously on 2 different occasions by Dr. Andree Elk of invasive cardiology at [facility]. 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 He went to [facility] infectious disease and had IV daptomycin for 5 weeks before developing eosinophilic pneumonitis and requiring an admission to hospital/ICU. He made a good recovery and is continued on doxycycline since. As mentioned his foot is closed now. He has a follow-up with Dr. Andree Elk tomorrow. He is going to [employer] on Monday for a custom-made shoe READMISSION Last visit Dr. Andree Elk [facility] 02/16/17 1. Critical limb ischemia of the RLE: s/p transmetatarsal amputation of the RLE, now completely healed. s/p right ATA percutaneous revascularization procedure x2, most recently 11/20/2016 s/p PTA of right AT 100% to less than 20% with a 2.5 x 200 balloon. He does have some residual osteomyelitis, but given the wound is closed, the orthopedist has recommended to follow. He has a fitting for a special shoe coming up next week. He has completed a course of daptomycin and doxycycline and he has been discharged by ID. He has completed a course of hyperbaric therapy. Continue Continue medical management with aspirin, Plavix and statin therapy. We will discuss ongoing Plavix therapy at follow-up in 6 months. 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 Given no symptoms, we will conservatively manage. He doesn't want to do any more invasive studies at this time, which is reasonable. The patient arrives today out of 2 concerns both on the right transmetatarsal site. 1 at the level of the reminiscent fifth metatarsal head and the other at roughly  the first or second. Both of these look like dark subcutaneous discoloration probably subdermal bleeding. He is recently obtained new adaptive footwear for the right foot. He also has a callus on the left fifth dorsal toe however he follows with podiatry for this and I don't think this is any issue. ABIs in this clinic today were 0.66 on the right and 1.06 on the left. On entrance into our clinic initially this was 0.95 and 0.92. He does not describe current claudication. They're going away on a cruise in 8 weeks and I think are trying to do the month is much as they can proactively. They follow with podiatry and have an appointment with Dr. Andree Elk in October READMISSION 06/25/18 This is a patient that we have not seen in almost a year. He is a type II diabetic with known PAD. He is followed by Dr. Andree Elk of interventional cardiology at [facility]  in [place]. He is required revascularization for significant PAD. When we first saw him he required a transmetatarsal amputation. He had underlying osteomyelitis with a nonhealing surgical wound. This eventually closed with wound care, IV antibiotics and hyperbaric oxygen. They tell me that he has a modified shoe and he is very active walking up to 4 miles a day. He is followed by Dr. Geroge Baseman of podiatry. His wife states that he underwent a removal of callus over this site on July 9. She felt there may be drainage from this site after that although she could never really determined and there was callus buildup again. On 06/01/18 there was pressure bleeding through the overlying callus. he was given a prescription for 7 days of Bactrim. Fortuitously he has an appointment with Dr. Andree Elk on 06/04/18 and he immediately underwent revascularization of the right leg although I have not had a chance to review these records in care everywhere. This was apparently done through anterior and retrograde access. On 06/06/18 he had another debridement by Dr. Bernette Mayers. Vitamin  soaking with Epsom salts for 20 minutes and applying calcium alginate. The original trans-met was on April 2017 I believe by Dr. Doran Durand. His ABI in our clinic was noncompressible today. 07/02/18; x-ray I ordered last week was negative for osteomyelitis. Swab culture was also negative. He is going to require an MRI which I have ordered today. 07/09/18; surprisingly the MRI of the foot that I ordered did not show osteomyelitis. He did suggest the possibility of cellulitis. For this reason I'll go ahead and give him a 10 day course of doxycycline. Although the previous culture of this area was negative 07/16/18; it arrives with the wound looking much the same. Roughly the same depth. He has thick subcutaneous tissue around the wound orifice but this still has roughly the same depth. Been using silver alginate. We applied Oasis #1 today 07/23/2018; still having to remove a lot of callus and thick subcutaneous tissue to actually define the wound here. Most of this seems to have closed down yet he has a comma shaped divot over the top of the area that still I think is open. We applied Oasis #2 His wife expressed concern about the tip of his left great toe. This almost looks like a small blister. She also showed it today to her podiatrist Dr. Geroge Baseman who did not think this was anything serious. I am not sure is anything serious either however I think it bears some watching. He is not in any pain however he is insensate 07/30/2018;; still on a lot of nonviable tissue over the surface of the wound however cleaning this up reveals a more substantial wound orifice but less of a probing wound depth. This is not probed to bone. There is no evidence of infection The wife is still concerned about a non-open area on the tip of his left great toe. Almost feels like a bony outgrowth. She had previously showed this to podiatry. I do not think this is a blister. A friction area would be possible although he is really not  walking according to his wife. He had a small skin tag on his right buttock but no open wound here either 08/06/2018; we applied a third Oasis last week. Unfortunately there is really no improvement. Still requiring extensive debridement to expose the wound bed from a horizontal slitlike depression. I still have not been able to get this to fill in properly. On the positive side there is now no probable bone  from when he first came into the facility. I changed him to silver alginate today after a reasonably aggressive debridement 08/13/2018; once again the patient comes in with skin and subcutaneous tissue closing over the small probing area with the underlying cavity of the wound on the right TMA site. I applied silver alginate to this last week. Prior to that we used Oasis x3 still not able to get this area granulating. He does not have a probing area of the bone which is an improvement from when I spur started working on this and an MRI did not suggest osteomyelitis. I do not see evidence of infection here but I am increasingly concerned about why I cannot get this area to granulate. Each time I debrided this is looks like this is simply a matter of getting granulation to fill in the hole and then getting epithelialization. This does not seem to happen Also I sent him back to see podiatry Dr. Earleen Newport about the what felt to be bony outgrowth on the tip of his left great toe. Apparently after heel he left the clinic last week or the next day he developed a blood blister. He did see Dr. Earleen Newport. He went on to have a debridement of the medial nail cuticle he now has an open area here as well as some denuded skin. They did an x-ray apparently does have a bony outgrowth or spur but I am not able to look at this. They are using topical antibiotics apparently there was some suggested he use Santyl. Patient's wife was anxious for my opinion of this 08/20/2018; we are able to keep the wound open this time  instead of the thick subcutaneous tissue closing over the top of it however unfortunately once again this probes to bone. I do not see any evidence of infection and previous MRI did not show osteomyelitis. I elected to go back to the Oasis to see if we can stimulate some granulation. Last saw his vascular interventional cardiologist Dr. Andree Elk at the beginning of August and he had a repeat procedure. Nevertheless I wonder how much blood flow he has down to this area With regards to the left first toe he is seeing Dr. Earleen Newport next week. They are applying Bactroban to this area. 08/27/2018 Once again he comes in with thick eschar and subcutaneous tissue over the top of the small probing hole. This does not appear to go down to bone but it still has roughly the same depth. I reapplied Oasis today Over the left great toe there appears to be more of the wound at the tip of his toe than there was last week. This has an eschar on the surface of it. Will change to Santyl. There is seeing podiatry this afternoon 09/03/2018 He comes in today with the area on the transmetatarsal's site with a fair amount of callus, nonviable tissue over the circumference but it was not closed. I removed all of this as well as some subcutaneous debris and reapplied Oasis. There is no exposed bone The area over the tip of the left great toe started off as a nodule of uncertain etiology. He has been followed with podiatry. They have been removing part of the medial nail bed. He has nonviable tissue over the wound he been using Santyl in this area 09/10/18 Unfortunately comes in with neither wound area looking improved. The transmetatarsal amputation site once Again has nonviable debris over the surface requiring debridement. Unfortunately underneath this there is nothing that looks viable  and this once again goes right down to bone. There is no purulent drainage and no erythema. Also the surgical wound from podiatry on the left  first toe has an ischemic-looking eschar over the surface of the tip of the toe I have elected not to attempt candidly debride this His wife as arranged for him to have follow-up noninvasive studies in [place] under the care of Dr. Andree Elk clinic. The question is he has known severe PAD. They had recently seen him in August. He has had revascularizations in both legs within the last 4 or 5 months. He is not complaining of pain and I cannot really get a history of claudication. He has not systemically unwell 09/20/2018 the patient has been to [facility] and been revascularized by Dr. Andree Elk earlier this week. Apparently he was able to open up the anterior tibial artery although I have not actually seen his formal report. He is going for an attempt to revascularize on the left on Monday. He is apparently working with an investigational stent for lower extremity arteries below the knee and he has talked to the patient about placing that on Monday if possible. The patient has been using silver alginate on the transmetatarsal amputation site and Santyl on the left 09/30/2018; patient had his revascularization on the left this apparently included a standard approach as well as a more distal arterial catheterization although I do not have any information on this from Dr. Andree Elk. In fact I do not even see the initial revascularization that he had on the right. I have included the arterial history from Dr. Andree Elk last note however below; ASSESSMENT/PLAN: 1. Hx of Critical limb ischemia bilateral lower extremities, PAD: -s/p transmetatarsal amputation of the RLE. -s/p right ATA percutaneous revascularization procedure x2, most recently 11/20/2016 s/p PTA of right AT 100% to less than 20% with a 2.5 x 200 balloon. 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 -03/21/2018 s/p PTA of  90% left popliteal artery to <10% with a 5x20 cutting balloon, PTA of 90% left peroneal to <20% with a 3x20 balloon, PTA of 100% left AT to <20% with a 2.5x220 balloon. 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 Will plan on the LLE soon after. Risks/benefits of procedure have been discussed and patient has elected to proceed. We have been using endoform to the right TMA amputation site wound and Santyl to the left great toe 10/07/2018; the area on the tip of his left great toe looked better we have been using Santyl here. We continue to have a very difficult probing hole on the right TMA amputation site we have been using endoform. I went on to use his fifth Oasis today 10/14/2018; the tip of the left great toe continues to look better. We have been using Santyl here the surface however is healthy and I think we can change to an alginate. The right TMA has not changed. Once again he has no superficial opening there is callus and thick subcutaneous tissue over the orifice once you remove this there is the probing area that we have been dealing with without too much change. There is no palpable bone I have been placing Oasis here and put Oasis #6 in this today after a more  vigorous debridement 10/21/18; the left great toe still has necrotic surface requiring debridement. The right TMA site hasn't changed in view of the thick callus over the wound bed. With removal of this there is still the opening however this does not appear to have the same depth. Again there is no palpable bone. Oasis was replaced 10/28/2018; patient comes in with both wounds looking worse. The area over the first toe tip is now down to bone. The area over the TMA site is deeper and down to bone clearly with a increase in overall wound area. Equally concerning on the right TMA is the  complete absence of a pulse this week which is a change. We are not even able to Doppler this. On the right he has a noncompressible ABI greater than 1.4 11/04/2018. Both wounds look somewhat worse. X-rays showed no osteomyelitis of the right foot but on the left there was underlying osteomyelitis in the left great toe distal phalanx. I been on the phone to Dr. Andree Elk surface at [facility] in [place] and they are arranging for another angiogram on the right on Monday. I have him on doxycycline for the osteomyelitis in the left great toe for now. Infectious disease may be necessary 1/17; 2-week hiatus. Patient was admitted to hospital at [place]. My understanding is he underwent an angioplasty of the right anterior tibial artery and had stents placed in the left anterior artery and the left tibial peroneal trunk. This was done by Dr. Andree Elk of interventional radiology. There is no major change in either 1 of the wounds. They have been using Aquacel Ag. As far as they are aware no imaging studies were done of the foot which is indeed unfortunate. I had him on doxycycline for 2 weeks since we identified the osteomyelitis in the left great toe by plain x-ray. I have renewed that again today. I am still suspicious about osteomyelitis in the amputation site and would consider doing another MRI to compare with the one done in September. The idea of hyperbaric oxygen certainly comes up for discussion 1/24; no major change in either wound area. I have him on doxycycline for osteomyelitis at the tip of the left great toe. As noted he has been previously and recently revascularized by Dr. Andree Elk at [place]. He is tolerating the doxycycline well. For some reason we do not have an infectious disease consult yet. Culture of drainage from the right foot site last week was negative 1/31; MRI of the right foot did not show osteomyelitis of the right ankle and foot. Notable for a skin ulceration overlying the second  metatarsal stump with generalizing soft tissue edema of the ankle and foot consistent with cellulitis. Noted to have a partial-thickness tear of the Achilles tendon 7.5 cm proximal to the insertion Nothing really new in terms of symptoms. Patient's area on the tip of the left great toe is just about closed although he still has the probing area on the metatarsal amputation site. Appointment with Dr. Linus Salmons of infectious disease next week 2/7; Dr. Novella Olive did not feel that any further antibiotics were necessary he would follow-up in 2 months. The left great toe appears to be closed still some surface callus that I gently looked under high did not see anything open or anything that was threatening to be open. He still has the open area on the mid part of his TMA site using endoform 2/14; left great toe is closed and he is completing his doxycycline as of last  Sunday. He is not on any antibiotics. Unfortunately out of the right foot his wife noticed some subdermal hemorrhage this week. He had been walking 2 miles I had given him permission to do so this is not really a plantar wound. Using endoform to this wound but I changed to silver alginate this week 2/21; left great toe remains closed. Culture last week grew Streptococcus angiosis which I am not really familiar with however it is penicillin sensitive and I am going to put him on Augmentin. Not much change in the wound on the right foot the deep area is still probing precariously close to bone and the wound on the margin of the TMA is larger. 2/28; left great toe remains closed. He is completing the Augmentin I gave him last week. 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 This is being shown to Dr. Andree Elk at [place] to see if there is anything else that can be done here. He has been using silver alginate strips on the right 3/6; left great toe remains closed. He sees Dr. Andree Elk on Monday. The area on the plantar  aspect of the right foot has the same small orifice with thick callused tissue around this. However this time with removal of the callus tissue the wound is open all the way along the incision line to the end medially. We have been using silver alginate. 3/13; left toe remains closed although the area is callused. He sees Dr. Jacqualyn Posey of podiatry next week. The area on the plantar right foot looked a lot better this week. Culture I did of this was negative we use silver alginate. He is going next week for an attempt at revascularization by Dr. Andree Elk 3/23; left toe remains closed although the area is callused. He will see Dr. Jacqualyn Posey in follow-up. The area on the right plantar foot continues to look surprisingly better over the last 3 visits. We have been using silver alginate. The revascularization he was supposed to have by Dr. Andree Elk at [facility] in [place] has been canceled [facility] procedure] 4/6; the right foot remains closed albeit callused. Podiatry canceled the appointment with regards to the left great toe. He has not seen Dr. Andree Elk at [facility] but thinks that Dr. Andree Elk has "done all he can do". He would be a candidate for the hemostaemix trial Readmission 07/29/2019 Mr. Stann Mainland is a man we know well from at least 3 previous stays in this clinic. He is a type II diabetic with severe PAD followed by Dr. Andree Elk at [facility] in [place]. 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 So far everything in both of these areas has remained closed. About 2 weeks ago he went to see his podiatrist [facility] Dr. Babs Bertin. He had a thick callus on the left fifth metatarsal head that was shaved. He has developed an open wound in this area. They have been offloading this in his diabetic shoes. The patient has not had any more revascularizations by Dr. Andree Elk since the last  time he was here. ABI in our clinic at the posterior tibial on the left was 1.06 10/6; no real change in the area on the plantar met head. Small wound with 2 mm of depth. He has thick skin probably from pressure around the wound. We have been using silver  alginate 10/13; small wound in the left fifth plantar met head. Arrives today with undermining laterally and purulent drainage. Our intake nurse cultured this. His wife stated they noticed a change in color over the last day or 2. He is not systemically unwell 10/19; small wound on the fifth plantar metatarsal head. Culture I did last week showed Staphylococcus lugdunensis. Although this could be a skin contaminant the possibility of a skin and soft tissue infection was there. I did give him empiric doxycycline which should have covered this. We are using silver alginate to the wound. We put him in a total contact cast today. 10/22; small wound on the fifth plantar metatarsal head. He has completed antibiotics. He also has severe PAD which worries me about just about any wound on this man's foot 10/29; small superficial area on the fifth plantar metatarsal head. Measuring slightly smaller. He is not currently on any antibiotics. I been using a total contact cast in this man with very severe PAD but he seems to be tolerating this well 11/5; left plantar fifth metatarsal head. Silver alginate being used under a total contact cast 11/12; wound not much different than last week. Using a #15 scalpel debridement around the wound. Still silver collagen under a total contact cast. He has severe PAD and that may be playing a role in this 11/19; disappointing that the wound is not really changed that much. I think it is come down in overall surface area because originally this was on the lateral part of the fifth metatarsal head however recently it is not really changed. Most of this is filled in but it will not epithelialized there is surface debris on  this which may be mostly related to ischemia. They have an appointment with Dr. Andree Elk on 12/9 09/24/2019 on evaluation today patient appears to be doing somewhat better with regard to the wound on the left fifth metatarsal head. Fortunately there does not appear to be any signs of active infection at this time. No fevers, chills, nausea, vomiting, or diarrhea. The wound does not appear to be completely closed and I do feel like the Hydrofera Blue is helping to some degree although I feel like the cast as well as keeping things from breaking down or getting any larger at least. As far as his wound overview it shows that the overall size of the wound is slightly smaller but really maintaining within the realm of about the same. He had no troubles with the cast which is good news. 12/1; left fifth metatarsal head. Perhaps somewhat more vibrant. We have been using Hydrofera Blue. He sees Dr. Andree Elk at [place] 1 week tomorrow. He be back next week and I hope to have a better idea which way the wound is going however I will not cast him next week in case Dr. Andree Elk wants to reevaluate things in his foot. The left leg is the leg with the stent in place and I wonder whether these are going to have to be reevaluated. 12/8; left fifth metatarsal head. Quite a bit better this week. Only a small open area remains. He sees Dr. Andree Elk at [place] tomorrow. Dr. Andree Elk is previously done his vascular interventions. He has 2 stents in the left leg as I remember things. I therefore will not put him in a total contact cast today. He will be using a forefoot off loader. We will use silver collagen on the wound. We will see him again next week 12/15; left fifth metatarsal head.  He saw Dr. Andree Elk and is scheduled for an angiogram on Thursday. Unfortunately although his wound was a lot better last week it is really deteriorated this week. Small punched-out hole with depth and overhanging tissue. We have been using Hydrofera  Blue 12/22; patient had an 80% stenosis proximal to the stent I believe in the below-knee popliteal artery. This was opened by Dr. Andree Elk. According the patient the stent itself was satisfactory. I have not been able to review this note. 12/29; patient arrives today with the wound about the same size some undermining medially. We have been using Hydrofera Blue under a total contact cast and that is what we will do again today 11/04/2019. Slightly smaller in orifice but about 4 mm undermining almost circumferentially. We have been using Hydrofera Blue. Apparently the Hydrofera Blue did not stay on the wound after we removed the cast. Some minor looking cast irritation on the right lateral 1/12; unfortunately things did not go well this week. Arrives in clinic today with a deeper wound with undermining more concerning a area of pus. Specimen was obtained for culture. We are not going to be able to put him in a cast today. 1/19; purulent material last week which I cultured showed Enterobacter cloacae. He was on ampicillin previously prescribed by his primary doctor which should not have covered this however mysteriously the wound looks somewhat better. There is less depth less erythema certainly no drainage. He will need a course of ciprofloxacin which I will provide today. 1/26; he has completed the ciprofloxacin. I don't think he needs any additional antibiotics. The areas on the left fifth plantar met head. We've been using silver alginate 2/2; comes in today with 0.4 cm of circumferential undermining around a small wound in this area. We have been using silver alginate with a forefoot offloading boot Electronic Signature(s) Signed: 12/02/2019 5:22:39 PM By: [name] MD Entered By: [name] on 12/02/2019 13:52:52 -------------------------------------------------------------------------------- Physical Exam Details Patient Name: Date of Service: [name], [name] 12/02/2019 12:30  PM Medical Record [id number] Patient Account Number: [id number] Date of Birth/Sex: Treating RN: 1945-12-21 (73 y.o. [name] Primary Care Provider: [name] Other Clinician: Referring Provider: Treating Provider/Extender:Ladaja Yusupov, [name], [name] in Treatment: 18 Constitutional Sitting or standing Blood Pressure is within target range for patient.. Pulse regular and within target range for patient.Marland Kitchen Respirations regular, non-labored and within target range.. Temperature is normal and within the target range for the patient.Marland Kitchen Appears in no distress. Notes Wound exam; no purulent drainage. Undermining at 0.4 cm circumferentially. Using a #15 scalpel and pickups I remove this. The bleeding is reasonably brisk. There is no opening to bone although the tissue was right on bone Electronic Signature(s) Signed: 12/02/2019 5:22:39 PM By: [name] MD Entered By: [name] on 12/02/2019 13:53:42 -------------------------------------------------------------------------------- Physician Orders Details Patient Name: Date of Service: [name], [name] 12/02/2019 12:30 PM Medical Record [id number] Patient Account Number: [id number] Date of Birth/Sex: Treating RN: 03/01/1946 (73 y.o. [name]) [name] Primary Care Provider: [name] Other Clinician: Referring Provider: Treating Provider/Extender:Jaiveon Suppes, [name], [name] in Treatment: 18 Verbal / Phone Orders: No Diagnosis Coding ICD-10 Coding Code Description E11.621 Type 2 diabetes mellitus with foot ulcer E11.51 Type 2 diabetes mellitus with diabetic peripheral angiopathy without gangrene L97.521 Non-pressure chronic ulcer of other part of left foot limited to breakdown of skin L03.116 Cellulitis of left lower limb Follow-up Appointments Return Appointment in 1 week. Dressing Change Frequency Wound #5 Left Metatarsal head fifth Change dressing every day.  Wound Cleansing Wound #5 Left  Metatarsal head fifth May shower and wash wound with soap and water. Primary Wound Dressing Wound #5 Left Metatarsal head fifth Hydrofera Blue Other: - pad right great toe for protection Secondary Dressing Kerlix/Rolled Gauze Dry Gauze Off-Loading Other: - Fore foot off loader Electronic Signature(s) Signed: 12/02/2019 5:22:39 PM By: [name] MD Signed: 12/02/2019 5:23:18 PM By: [name] RN Entered By: [name] on 12/02/2019 13:23:15 -------------------------------------------------------------------------------- Problem List Details Patient Name: Date of Service: [name], [name] 12/02/2019 12:30 PM Medical Record [id number] Patient Account Number: [id number] Date of Birth/Sex: Treating RN: December 05, 1945 (73 y.o. Staci Acosta, [place] Primary Care Provider: [name] Other Clinician: Referring Provider: Treating Provider/Extender:Serita Degroote, [name], [name] in Treatment: 18 Active Problems ICD-10 Evaluated Encounter Code Description Active Date Today Diagnosis E11.621 Type 2 diabetes mellitus with foot ulcer 07/29/2019 No Yes E11.51 Type 2 diabetes mellitus with diabetic peripheral 07/29/2019 No Yes angiopathy without gangrene L97.521 Non-pressure chronic ulcer of other part of left foot 07/29/2019 No Yes limited to breakdown of skin L03.116 Cellulitis of left lower limb 08/12/2019 No Yes Inactive Problems Resolved Problems Electronic Signature(s) Signed: 12/02/2019 5:22:39 PM By: [name] MD Entered By: [name] on 12/02/2019 13:50:23 -------------------------------------------------------------------------------- Progress Note Details Patient Name: Date of Service: [name], [name] 12/02/2019 12:30 PM Medical Record [id number] Patient Account Number: [id number] Date of Birth/Sex: Treating RN: [birth year] (73 y.o. [name] Primary Care Provider: [name] Other Clinician: Referring Provider: Treating Provider/Extender:Leny Morozov,  [name], [name] in Treatment: 18 Subjective History of Present Illness (HPI) 05/18/16; this is a 74year-old diabetic who is a type II diabetic on insulin. The history is that he traumatized his right foot developed a sore sometime in late March. Shortly thereafter he went on a cruise but he had to get off the cruise ship in [place] and fly urgently back to [place] where he was admitted to [facility] and ultimately underwent a transmetatarsal amputation by Dr. 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 According to the patient and his wife this wound never really healed. He was seen on 2 occasions in the wound care center in [place] and had vascular studies and then was referred urgently to Dr. Bridgett Larsson of vascular surgery. He underwent an angiogram on 05/10/16. Unfortunately nothing really could be done to improve his vascular status. He had a 75-90% stenosis in the midsegment of 1 segment of the posterior femoral artery. He had a patent popliteal, his anterior tibial occluded shortly after takeoff. Perineal had a greater than 90% stenosis posterior tibial is occluded feet had no distal collaterals feed distal aspect of the transmetatarsal amputation site. The patient tells me that he had a prolonged period of Santyl by Dr. Doran Durand was some initial improvement but then this was stopped. I think they're only applying daily dressings/dry dressings. He has not had a recent x-ray of the right foot he did have one before his surgery in April. His wife by the dimensions of the wound/surgical site being followed at home since 4/20. At that point the dimensions were 0.5 x 12 x 0.2 on 7/19 this was 1.8 x 6 x 0.4. He is not currently on any antibiotics. His hemoglobin A1c in early April was 12.9 at that point he was started on insulin. Apparently his blood sugars are much lower he has an appointment with Dr. Legrand Como Alteimer of endocrine next week. 05/29/16 x-ray of the area did not show  osteomyelitis. I think he probably needs an MRI at this  point. His wife is asking about something called"Yireh" cream which is not FDA approved. I have not heard of this. 06/22/16; MRI did not really suggest osteomyelitis. There was minimal marrow edema and enhancement in the stump of the second metatarsal felt to be secondary likely to postoperative change rather than osteomyelitis. The patient has arranged his own consultation with Dr. Andree Elk at [facility], apparently their daughter lives in [place] and has some connection here. Any improvement in vascular supply by Dr. Andree Elk would of course be helpful. Dr. Bridgett Larsson did not feel that anything further could be done other than amputation if wound care did not result in healing or if the area deteriorates. 06/26/16; the patient has been to see Dr. Andree Elk at [place] and had an angiogram. He is going for a procedure on Thursday which will involve catheterization. I'm not sure if this is an anterograde or retrograde approach. He has been using Santyl to the wound 07/10/16; the patient had a repeat angiogram and angioplasty at [place] by Dr. Brunetta Jeans. His angiogram showed right CFA and profundal widely patent. The right as of a.m. popliteal artery were widely patent the right anterior tibial was occluded proximally and reconstitutes at the ankle. Peroneal artery was patent to the foot. Posterior tibial artery was occluded. The patient had angioplasty of the anterior tibial artery.. This was quite successful. He was recommended for Plavix as well as aspirin. 07/17/16; the patient was close to be a nurse visit today however the outer dressing of the Apligraf fell off. Noted drainage. I was asked to see the wound. The patient is noted an odor however his wife had noted that. Drainage with Apligraf not necessarily a bad thing. He has not been systemically unwell 07/24/16; we are still have an issue with drainage of this wound. In spite of this I applied his second Apligraf.  Medially the area still is probing to bone. 08/07/16; Apligraf reapplied in general wound looks improved. 08/21/16 Apligraf #4. Wound looks much better 09/04/16 patientt's wound again today continues to appear to improve with the application of the Apligraf's. He notes no increased discomfort or concerns at this point in time. 09/18/16; the patient returns today 2 weeks after his fifth application of Apligraf. Predictably three quarters of the width of this wound has healed. The deep area that probe to bone medially is still open. The patient asked how much out-of-pocket dollars would be for additional Apligraf's. 09/25/16; now using Hydrofera Blue. He has completed 5 Apligraf applications with considerable improvement in this deep open transmetatarsal amputation site. His wound is now a triangular-shaped wound on the medial aspect. At roughly 12 to 2:00 this probes another centimeter but as opposed to in the past this does not probe to bone. The patient has been seen at [place] by Dr. Zenia Resides. He is not planning to do any more revascularization unless the wound stalls or worsens per the patient 10/02/16; 0.7 x 0.8 x 0.8. Unfortunately although the wound looks stable to improved. There is now easily probable bone. This hasn't been present for several weeks. Patient is not otherwise symptomatic he is not experiencing any pain. I did a culture of the wound bed 10/09/16. Deterioration last week. Culture grew MRSA and although there is improvement here with doxycycline prescribed over the phone I'm going to try to get him linezolid 600 twice a day for 10 days today. 10/16/16; he is completing a weeks worth of linezolid and still has 3 more days to go. Small triangular-shaped open area  with some degree of undermining. There is still palpable bone with a curet. Overall the area appears better than last week 10/20/16 he has completed the linezolid still having some nausea and vomiting but no diarrhea. He has  exposed bone this week which is a deterioration. 10/27/16 patient now has a small but probing wound down to bone. Culture of this bone that I did last week showed a few methicillin-resistant staph aureus. I have little doubt that this represents acute/subacute osteomyelitis. The patient is currently on Doxy which I will continue he also completed 10 days of linezolid. We are now in a difficult situation with this patient's foot after considerable discussion we will send him back to see Dr. Doran Durand for a surgical opinion of this I'm also going to try to arrange a infectious disease consult at [facility] hopefully week and get this prior to her usual 4-6 weeks we having [place]. The patient clearly is going to need 6 weeks of IV vancomycin. If we cannot arrange this expediently I'll have to consider ordering this myself through a home infusion company 11/03/16; the patient now has a small in terms of circumference but probing wound. No bone palpable today. The patient remains on doxycycline 100 twice a day which should support him until he sees infectious disease at [facility] next week the following week on Wednesday I believe he has an appointment with Dr. Andree Elk at [facility] who is his vascular cardiologist. Finally he has an appointment with Dr. Doran Durand on 11/22/16 we have been using silver alginate. The patient's wife states they are having trouble getting this through [facility] 11/13/16; the patient was seen by infectious disease at [facility] in the 11th PICC line placed in preparation for IV antibiotics. A tummy he has not going to get IV vancomycin o Ceftaroline. They've also ordered an MRI. Patient has a follow-up with Dr. Doran Durand on 11/22/16 and Dr. Andree Elk at [facility] tomorrow 11/23/16 the patient is on daptomycin as directed by infectious disease at [facility]. He is also been back to see Dr. Andree Elk at [facility]. He underwent a repeat arteriogram. He had a successful PTA of the right anterior  tibial artery. He is on dual antiplatelete treatment with Plavix and aspirin. Finally he had the MRI of his foot in [place]. This showed cellulitis about the foot worse distally edema and enhancement in the reminiscent of the second metatarsal was consistent with osteomyelitis therefore what I was assuming to be the first metatarsal may be actually the second. He also has a fluid collection deep to the calcaneus at the level of the calcaneal spur which could be an abscess or due to adventitial bursitis. He had a small tear in his Achilles 11/30/16; the patient continues on daptomycin as directed by infectious disease at [facility]. He is been revascularized by Dr. Andree Elk at [facility] in [place]. He has been to see Gretta Arab who was the orthopedic surgeon who did his original amputation. I have not seen his not however per the patient's wife he did not offer another surgical local surgical prodecure to remove involved bone. He verbalized his usual disbelief in not just hyperbarics but any medical therapy for this condition(osteomyelitis). I discussed this in detail with the patient today including answering the question about a BKA definitively "curing" the current condition. 12/07/16; the patient continues on daptomycin as directed by infectious disease at [facility]. This is directed at the MRSA that we cultured from his bone debridement from 12/22. Lab work today shows a  white count of 8.7 hemoglobin of 10.5 which is microcytic and hypochromic differential count shows a slightly elevated monocyte count at 1.2 eosinophilic count of 0.6. His creatinine is 1.11 sedimentation rate apparently is gone from 35-34 now 40. I explained was wife I don't think this represents a trend. His total CK is 48 12/14/16- patient is here for follow-up evaluation of his right TMA site. He continues to receive IV daptomycin per infectious disease. His serum inflammatory markers remain elevated. He complains of intermittent  pain to the medial aspect of the TMA site with intermittent erythema. He voices no complaints or concerns regarding hyperbaric therapy. Overall he and his wife are expressing a frustration and discouragement regarrding the length of time of treatment. 12/21/16; small open wound at roughly the first or second metatarsal metatarsalphalyngeal joint reminiscence of his transmetatarsal amputation site he continues to receive IV daptomycin per infectious disease at [facility]. He will finish these a week tomorrow. He has lab work which I been copied on. His white count is 10.8 hemoglobin 8.9 MCV is low at 75., MCH low at 24.5 platelet count slightly elevated at 626. Differential count shows 70% neutrophils 10% monocytes and 10% eosinophils. His comprehensive metabolic panel shows a slightly low sodium at 133 albumin low at 3.1 total CK is normal at 42 sedimentation rate is much higher at 82. He has had iron studies that show a serum iron of 15 and iron binding capacity of 238 and iron saturation of 6. This is suggestive of iron deficiency. B12 and folate were normal ferritin at 113 The patient tells me that he is not eating well and he has lost weight. He feels episodically nauseated. He is coughing and gagging on mucus which she thinks is sinusitis. He has an appointment with his primary doctor at 5:00 this afternoon in [facility] 01/02/17; the patient developed a subacute pneumonitis. He was admitted to [facility] after a CT scan showed an extensive interstitial pneumonitis [I have not yet seen this]. 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 He has since been discharged. He is not on oxygen. He feels fatigued and very short of breath with exertion. At [facility] the wound care nurse there felt that his wound was healed. He did complete his daptomycin and has follow-up with infectious disease on  Friday. X-rays I did before he went to [facility] still suggested residual osteomyelitis in the anterior aspect of the second must metatarsal head. I'm not sure I would've expected any different. There was no other findings. I actually think I did this because of erythema over the first metatarsal head reminiscent 01/11/17; the patient has eosinophilic pneumonitis. He has been reviewed by pulmonology and given clearance for hyperbarics at least that's what his wife says. I'll need to see if there is note in care everywhere. Apparently the prognosis for improvement of daptomycin induced eosinophilic granulocyte is is 3 months without steroids. In the meantime infectious disease has placed him on doxycycline until the wound is closed. He is still is lost a lot of weight and his blood sugars are running in the mid 60s to low 80s fasting and at all times during the day 01/18/17; he has had adjustments in his insulin apparently his blood sugars in the morning or over 100. He wants to restart his hyperbaric treatment we'll do this at 1:00. 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 I think there is good reason to complete his treatments in order to give him the best chance of maintaining a healed status and these DFU 3 wounds with MRSA infection in the bone 02/15/17; the patient was seen today in conjunction with HBO. He completed hyperbaric oxygen today. The open area on his transmetatarsal site has remained closed. There was an area of erythema when I saw him earlier in the week on the posterior heel although that is resolved as of today as well. He has been using a cam walker. This is a patient who came to [place] after a transmetatarsal amputation that was necrotic and dehisced. He required revascularization percutaneously on 2 different occasions by Dr. Andree Elk of invasive cardiology at [facility]. 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 He went to [facility] infectious disease and had IV daptomycin for 5 weeks before developing eosinophilic pneumonitis and requiring an admission to hospital/ICU. He made a good recovery and is continued on doxycycline since. As mentioned his foot is closed now. He has a follow-up with Dr. Andree Elk tomorrow. He is going to [employer] on Monday for a custom-made shoe READMISSION Last visit Dr. Andree Elk [facility] 02/16/17 1. Critical limb ischemia of the RLE:  s/p transmetatarsal amputation of the RLE, now completely healed.  s/p right ATA percutaneous revascularization procedure x2, most recently 11/20/2016 s/p PTA of right AT 100% to less than 20% with a 2.5 x 200 balloon.  He does have some residual osteomyelitis, but given the wound is closed, the orthopedist has recommended to follow. He has a fitting for a special shoe coming up next week. He has completed a course of daptomycin and doxycycline and he has been discharged by ID. He has completed a course of hyperbaric therapy.  Continue Continue medical management with aspirin, Plavix and statin therapy. We will discuss ongoing Plavix therapy at follow-up in 6 months.  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 Given no symptoms, we will conservatively manage. He doesn't want to do any more invasive studies at this time, which is reasonable. The patient arrives today out of 2 concerns both on the right transmetatarsal site. 1 at the level of the reminiscent fifth metatarsal head and the other at roughly the first or second. Both of these look like dark subcutaneous discoloration probably subdermal bleeding. He is recently obtained new adaptive footwear for the right foot. He also has a callus on the left  fifth dorsal toe however he follows with podiatry for this and I don't think this is any issue. ABIs in this clinic today were 0.66 on the right and 1.06 on the left. On entrance into our clinic initially this was 0.95 and 0.92. He does not describe current claudication. They're going away on a cruise in 8 weeks and I think are trying to do the month is much as they can proactively. They follow with podiatry and have an appointment with Dr. Andree Elk in October READMISSION 06/25/18 This is a patient that we have not seen in almost a year. He is a type II diabetic with known PAD. He is followed by Dr. Andree Elk of interventional cardiology at [facility] in [place]. He is required revascularization for significant PAD. When we first saw him he required a transmetatarsal amputation. He  had underlying osteomyelitis with a nonhealing surgical wound. This eventually closed with wound care, IV antibiotics and hyperbaric oxygen. They tell me that he has a modified shoe and he is very active walking up to 4 miles a day. He is followed by Dr. Geroge Baseman of podiatry. His wife states that he underwent a removal of callus over this site on July 9. She felt there may be drainage from this site after that although she could never really determined and there was callus buildup again. On 06/01/18 there was pressure bleeding through the overlying callus. he was given a prescription for 7 days of Bactrim. Fortuitously he has an appointment with Dr. Andree Elk on 06/04/18 and he immediately underwent revascularization of the right leg although I have not had a chance to review these records in care everywhere. This was apparently done through anterior and retrograde access. On 06/06/18 he had another debridement by Dr. Bernette Mayers. Vitamin soaking with Epsom salts for 20 minutes and applying calcium alginate. The original trans-met was on April 2017 I believe by Dr. Doran Durand. His ABI in our clinic was noncompressible today. 07/02/18; x-ray I  ordered last week was negative for osteomyelitis. Swab culture was also negative. He is going to require an MRI which I have ordered today. 07/09/18; surprisingly the MRI of the foot that I ordered did not show osteomyelitis. He did suggest the possibility of cellulitis. For this reason I'll go ahead and give him a 10 day course of doxycycline. Although the previous culture of this area was negative 07/16/18; it arrives with the wound looking much the same. Roughly the same depth. He has thick subcutaneous tissue around the wound orifice but this still has roughly the same depth. Been using silver alginate. We applied Oasis #1 today 07/23/2018; still having to remove a lot of callus and thick subcutaneous tissue to actually define the wound here. Most of this seems to have closed down yet he has a comma shaped divot over the top of the area that still I think is open. We applied Oasis #2 His wife expressed concern about the tip of his left great toe. This almost looks like a small blister. She also showed it today to her podiatrist Dr. Geroge Baseman who did not think this was anything serious. I am not sure is anything serious either however I think it bears some watching. He is not in any pain however he is insensate 07/30/2018;; still on a lot of nonviable tissue over the surface of the wound however cleaning this up reveals a more substantial wound orifice but less of a probing wound depth. This is not probed to bone. There is no evidence of infection The wife is still concerned about a non-open area on the tip of his left great toe. Almost feels like a bony outgrowth. She had previously showed this to podiatry. I do not think this is a blister. A friction area would be possible although he is really not walking according to his wife. He had a small skin tag on his right buttock but no open wound here either 08/06/2018; we applied a third Oasis last week. Unfortunately there is really no improvement.  Still requiring extensive debridement to expose the wound bed from a horizontal slitlike depression. I still have not been able to get this to fill in properly. On the positive side there is now no probable bone from when he first came into the facility. I changed him to silver alginate today after a reasonably aggressive debridement  08/13/2018; once again the patient comes in with skin and subcutaneous tissue closing over the small probing area with the underlying cavity of the wound on the right TMA site. I applied silver alginate to this last week. Prior to that we used Oasis x3 still not able to get this area granulating. He does not have a probing area of the bone which is an improvement from when I spur started working on this and an MRI did not suggest osteomyelitis. I do not see evidence of infection here but I am increasingly concerned about why I cannot get this area to granulate. Each time I debrided this is looks like this is simply a matter of getting granulation to fill in the hole and then getting epithelialization. This does not seem to happen Also I sent him back to see podiatry Dr. Earleen Newport about the what felt to be bony outgrowth on the tip of his left great toe. Apparently after heel he left the clinic last week or the next day he developed a blood blister. He did see Dr. Earleen Newport. He went on to have a debridement of the medial nail cuticle he now has an open area here as well as some denuded skin. They did an x-ray apparently does have a bony outgrowth or spur but I am not able to look at this. They are using topical antibiotics apparently there was some suggested he use Santyl. Patient's wife was anxious for my opinion of this 08/20/2018; we are able to keep the wound open this time instead of the thick subcutaneous tissue closing over the top of it however unfortunately once again this probes to bone. I do not see any evidence of infection and previous MRI did not show  osteomyelitis. I elected to go back to the Oasis to see if we can stimulate some granulation. Last saw his vascular interventional cardiologist Dr. Andree Elk at the beginning of August and he had a repeat procedure. Nevertheless I wonder how much blood flow he has down to this area With regards to the left first toe he is seeing Dr. Earleen Newport next week. They are applying Bactroban to this area. 08/27/2018 ooOnce again he comes in with thick eschar and subcutaneous tissue over the top of the small probing hole. This does not appear to go down to bone but it still has roughly the same depth. I reapplied Oasis today ooOver the left great toe there appears to be more of the wound at the tip of his toe than there was last week. This has an eschar on the surface of it. Will change to Santyl. There is seeing podiatry this afternoon 09/03/2018 ooHe comes in today with the area on the transmetatarsal's site with a fair amount of callus, nonviable tissue over the circumference but it was not closed. I removed all of this as well as some subcutaneous debris and reapplied Oasis. There is no exposed bone ooThe area over the tip of the left great toe started off as a nodule of uncertain etiology. He has been followed with podiatry. They have been removing part of the medial nail bed. He has nonviable tissue over the wound he been using Santyl in this area 09/10/18 ooUnfortunately comes in with neither wound area looking improved. The transmetatarsal amputation site once Again has nonviable debris over the surface requiring debridement. Unfortunately underneath this there is nothing that looks viable and this once again goes right down to bone. There is no purulent drainage and no erythema. ooAlso the surgical  wound from podiatry on the left first toe has an ischemic-looking eschar over the surface of the tip of the toe I have elected not to attempt candidly debride this ooHis wife as arranged for him to have  follow-up noninvasive studies in [place] under the care of Dr. Andree Elk clinic. The question is he has known severe PAD. They had recently seen him in August. He has had revascularizations in both legs within the last 4 or 5 months. He is not complaining of pain and I cannot really get a history of claudication. He has not systemically unwell 09/20/2018 the patient has been to [facility] and been revascularized by Dr. Andree Elk earlier this week. Apparently he was able to open up the anterior tibial artery although I have not actually seen his formal report. He is going for an attempt to revascularize on the left on Monday. He is apparently working with an investigational stent for lower extremity arteries below the knee and he has talked to the patient about placing that on Monday if possible. The patient has been using silver alginate on the transmetatarsal amputation site and Santyl on the left 09/30/2018; patient had his revascularization on the left this apparently included a standard approach as well as a more distal arterial catheterization although I do not have any information on this from Dr. Andree Elk. In fact I do not even see the initial revascularization that he had on the right. I have included the arterial history from Dr. Andree Elk last note however below; ASSESSMENT/PLAN: 1. Hx of Critical limb ischemia bilateral lower extremities, PAD: -s/p transmetatarsal amputation of the RLE. -s/p right ATA percutaneous revascularization procedure x2, most recently 11/20/2016 s/p PTA of right AT 100% to less than 20% with a 2.5 x 200 balloon. 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 -03/21/2018 s/p PTA of 90% left popliteal artery to <10% with a 5x20 cutting balloon, PTA of 90% left peroneal to <20% with a 3x20 balloon, PTA of 100% left AT to <20% with a 2.5x220  balloon. 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 Will plan on the LLE soon after. Risks/benefits of procedure have been discussed and patient has elected to proceed. We have been using endoform to the right TMA amputation site wound and Santyl to the left great toe 10/07/2018; the area on the tip of his left great toe looked better we have been using Santyl here. We continue to have a very difficult probing hole on the right TMA amputation site we have been using endoform. I went on to use his fifth Oasis today 10/14/2018; the tip of the left great toe continues to look better. We have been using Santyl here the surface however is healthy and I think we can change to an alginate. The right TMA has not changed. Once again he has no superficial opening there is callus and thick subcutaneous tissue over the orifice once you remove this there is the probing area that we have been dealing with without too much change. There is no palpable bone I have been placing Oasis here and put Oasis #6 in this today after a more vigorous debridement 10/21/18; the left great toe still has necrotic surface requiring debridement. The right TMA site hasn't changed in  view of the thick callus over the wound bed. With removal of this there is still the opening however this does not appear to have the same depth. Again there is no palpable bone. Oasis was replaced 10/28/2018; patient comes in with both wounds looking worse. The area over the first toe tip is now down to bone. The area over the TMA site is deeper and down to bone clearly with a increase in overall wound area. Equally concerning on the right TMA is the complete absence of a pulse this week which is a change. We are not even able to Doppler this. On the right he has a noncompressible ABI greater than  1.4 11/04/2018. Both wounds look somewhat worse. X-rays showed no osteomyelitis of the right foot but on the left there was underlying osteomyelitis in the left great toe distal phalanx. I been on the phone to Dr. Andree Elk surface at [facility] in [place] and they are arranging for another angiogram on the right on Monday. I have him on doxycycline for the osteomyelitis in the left great toe for now. Infectious disease may be necessary 1/17; 2-week hiatus. Patient was admitted to hospital at [place]. My understanding is he underwent an angioplasty of the right anterior tibial artery and had stents placed in the left anterior artery and the left tibial peroneal trunk. This was done by Dr. Andree Elk of interventional radiology. There is no major change in either 1 of the wounds. They have been using Aquacel Ag. As far as they are aware no imaging studies were done of the foot which is indeed unfortunate. I had him on doxycycline for 2 weeks since we identified the osteomyelitis in the left great toe by plain x-ray. I have renewed that again today. I am still suspicious about osteomyelitis in the amputation site and would consider doing another MRI to compare with the one done in September. The idea of hyperbaric oxygen certainly comes up for discussion 1/24; no major change in either wound area. I have him on doxycycline for osteomyelitis at the tip of the left great toe. As noted he has been previously and recently revascularized by Dr. Andree Elk at [place]. He is tolerating the doxycycline well. For some reason we do not have an infectious disease consult yet. Culture of drainage from the right foot site last week was negative 1/31; MRI of the right foot did not show osteomyelitis of the right ankle and foot. Notable for a skin ulceration overlying the second metatarsal stump with generalizing soft tissue edema of the ankle and foot consistent with cellulitis. Noted to have a partial-thickness tear of the Achilles  tendon 7.5 cm proximal to the insertion Nothing really new in terms of symptoms. Patient's area on the tip of the left great toe is just about closed although he still has the probing area on the metatarsal amputation site. Appointment with Dr. Linus Salmons of infectious disease next week 2/7; Dr. Novella Olive did not feel that any further antibiotics were necessary he would follow-up in 2 months. The left great toe appears to be closed still some surface callus that I gently looked under high did not see anything open or anything that was threatening to be open. He still has the open area on the mid part of his TMA site using endoform 2/14; left great toe is closed and he is completing his doxycycline as of last Sunday. He is not on any antibiotics. Unfortunately out of the right foot his wife noticed some subdermal hemorrhage this  week. He had been walking 2 miles I had given him permission to do so this is not really a plantar wound. Using endoform to this wound but I changed to silver alginate this week 2/21; left great toe remains closed. Culture last week grew Streptococcus angiosis which I am not really familiar with however it is penicillin sensitive and I am going to put him on Augmentin. Not much change in the wound on the right foot the deep area is still probing precariously close to bone and the wound on the margin of the TMA is larger. 2/28; left great toe remains closed. He is completing the Augmentin I gave him last week. 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 This is being shown to Dr. Andree Elk at [place] to see if there is anything else that can be done here. He has been using silver alginate strips on the right 3/6; left great toe remains closed. He sees Dr. Andree Elk on Monday. The area on the plantar aspect of the right foot has the same small orifice with thick callused tissue around this. However this time with removal of the callus tissue the wound is open  all the way along the incision line to the end medially. We have been using silver alginate. 3/13; left toe remains closed although the area is callused. He sees Dr. Jacqualyn Posey of podiatry next week. The area on the plantar right foot looked a lot better this week. Culture I did of this was negative we use silver alginate. He is going next week for an attempt at revascularization by Dr. Andree Elk 3/23; left toe remains closed although the area is callused. He will see Dr. Jacqualyn Posey in follow-up. The area on the right plantar foot continues to look surprisingly better over the last 3 visits. We have been using silver alginate. The revascularization he was supposed to have by Dr. Andree Elk at [facility] in [place] has been canceled [facility] procedure] 4/6; the right foot remains closed albeit callused. Podiatry canceled the appointment with regards to the left great toe. He has not seen Dr. Andree Elk at [facility] but thinks that Dr. Andree Elk has "done all he can do". He would be a candidate for the hemostaemix trial Readmission 07/29/2019 Mr. Stann Mainland is a man we know well from at least 3 previous stays in this clinic. He is a type II diabetic with severe PAD followed by Dr. Andree Elk at [facility] in [place]. 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 So far everything in both of these areas has remained closed. About 2 weeks ago he went to see his podiatrist at [facility] Dr. Babs Bertin. He had a thick callus on the left fifth metatarsal head that was shaved. He has developed an open wound in this area. They have been offloading this in his diabetic shoes. The patient has not had any more revascularizations by Dr. Andree Elk since the last time he was here. ABI in our clinic at the posterior tibial on the left was 1.06 10/6; no real change in the area on the plantar met head. Small wound with 2 mm  of depth. He has thick skin probably from pressure around the wound. We have been using silver alginate 10/13; small wound in the left fifth plantar met head. Arrives today with undermining laterally and purulent drainage. Our  intake nurse cultured this. His wife stated they noticed a change in color over the last day or 2. He is not systemically unwell 10/19; small wound on the fifth plantar metatarsal head. Culture I did last week showed Staphylococcus lugdunensis. Although this could be a skin contaminant the possibility of a skin and soft tissue infection was there. I did give him empiric doxycycline which should have covered this. We are using silver alginate to the wound. We put him in a total contact cast today. 10/22; small wound on the fifth plantar metatarsal head. He has completed antibiotics. He also has severe PAD which worries me about just about any wound on this man's foot 10/29; small superficial area on the fifth plantar metatarsal head. Measuring slightly smaller. He is not currently on any antibiotics. I been using a total contact cast in this man with very severe PAD but he seems to be tolerating this well 11/5; left plantar fifth metatarsal head. Silver alginate being used under a total contact cast 11/12; wound not much different than last week. Using a #15 scalpel debridement around the wound. Still silver collagen under a total contact cast. He has severe PAD and that may be playing a role in this 11/19; disappointing that the wound is not really changed that much. I think it is come down in overall surface area because originally this was on the lateral part of the fifth metatarsal head however recently it is not really changed. Most of this is filled in but it will not epithelialized there is surface debris on this which may be mostly related to ischemia. They have an appointment with Dr. Andree Elk on 12/9 09/24/2019 on evaluation today patient appears to be doing somewhat  better with regard to the wound on the left fifth metatarsal head. Fortunately there does not appear to be any signs of active infection at this time. No fevers, chills, nausea, vomiting, or diarrhea. The wound does not appear to be completely closed and I do feel like the Hydrofera Blue is helping to some degree although I feel like the cast as well as keeping things from breaking down or getting any larger at least. As far as his wound overview it shows that the overall size of the wound is slightly smaller but really maintaining within the realm of about the same. He had no troubles with the cast which is good news. 12/1; left fifth metatarsal head. Perhaps somewhat more vibrant. We have been using Hydrofera Blue. He sees Dr. Andree Elk at [place] [facility] 1 week tomorrow. He be back next week and I hope to have a better idea which way the wound is going however I will not cast him next week in case Dr. Andree Elk wants to reevaluate things in his foot. The left leg is the leg with the stent in place and I wonder whether these are going to have to be reevaluated. 12/8; left fifth metatarsal head. Quite a bit better this week. Only a small open area remains. He sees Dr. Andree Elk at [place] tomorrow. Dr. Andree Elk is previously done his vascular interventions. He has 2 stents in the left leg as I remember things. I therefore will not put him in a total contact cast today. He will be using a forefoot off loader. We will use silver collagen on the wound. We will see him again next week 12/15; left fifth metatarsal head. He saw Dr. Andree Elk and is scheduled for an angiogram on Thursday. Unfortunately although his wound was a lot better  last week it is really deteriorated this week. Small punched-out hole with depth and overhanging tissue. We have been using Hydrofera Blue 12/22; patient had an 80% stenosis proximal to the stent I believe in the below-knee popliteal artery. This was opened by Dr. Andree Elk. According the patient the stent  itself was satisfactory. I have not been able to review this note. 12/29; patient arrives today with the wound about the same size some undermining medially. We have been using Hydrofera Blue under a total contact cast and that is what we will do again today 11/04/2019. Slightly smaller in orifice but about 4 mm undermining almost circumferentially. We have been using Hydrofera Blue. Apparently the Hydrofera Blue did not stay on the wound after we removed the cast. Some minor looking cast irritation on the right lateral 1/12; unfortunately things did not go well this week. Arrives in clinic today with a deeper wound with undermining more concerning a area of pus. Specimen was obtained for culture. We are not going to be able to put him in a cast today. 1/19; purulent material last week which I cultured showed Enterobacter cloacae. He was on ampicillin previously prescribed by his primary doctor which should not have covered this however mysteriously the wound looks somewhat better. There is less depth less erythema certainly no drainage. He will need a course of ciprofloxacin which I will provide today. 1/26; he has completed the ciprofloxacin. I don't think he needs any additional antibiotics. The areas on the left fifth plantar met head. We've been using silver alginate 2/2; comes in today with 0.4 cm of circumferential undermining around a small wound in this area. We have been using silver alginate with a forefoot offloading boot Objective Constitutional Sitting or standing Blood Pressure is within target range for patient.. Pulse regular and within target range for patient.Marland Kitchen Respirations regular, non-labored and within target range.. Temperature is normal and within the target range for the patient.Marland Kitchen Appears in no distress. Vitals Time Taken: 12:39 PM, Height: 69 in, Source: Stated, Weight: 210 lbs, Source: Stated, BMI: 31, Temperature: 98.5 F, Pulse: 74 bpm, Respiratory Rate: 18  breaths/min, Blood Pressure: 126/75 mmHg, Capillary Blood Glucose: 131 mg/dl. General Notes: glucose per pt report this am General Notes: Wound exam; no purulent drainage. Undermining at 0.4 cm circumferentially. Using a #15 scalpel and pickups I remove this. The bleeding is reasonably brisk. There is no opening to bone although the tissue was right on bone Integumentary (Hair, Skin) Wound #5 status is Open. Original cause of wound was Gradually Appeared. The wound is located on the Left Metatarsal head fifth. The wound measures 0.4cm length x 0.4cm width x 0.2cm depth; 0.126cm^2 area and 0.025cm^3 volume. There is Fat Layer (Subcutaneous Tissue) Exposed exposed. There is no tunneling noted, however, there is undermining starting at 12:00 and ending at 12:00 with a maximum distance of 0.4cm. There is a small amount of serosanguineous drainage noted. The wound margin is well defined and not attached to the wound base. There is large (67-100%) red, friable granulation within the wound bed. There is no necrotic tissue within the wound bed. Assessment Active Problems ICD-10 Type 2 diabetes mellitus with foot ulcer Type 2 diabetes mellitus with diabetic peripheral angiopathy without gangrene Non-pressure chronic ulcer of other part of left foot limited to breakdown of skin Cellulitis of left lower limb Procedures Wound #5 Pre-procedure diagnosis of Wound #5 is a Diabetic Wound/Ulcer of the Lower Extremity located on the Left Metatarsal head fifth .Severity of Tissue  Pre Debridement is: Fat layer exposed. There was a Selective/Open Wound Skin/Epidermis Debridement with a total area of 0.16 sq cm performed by Ricard Dillon., MD. With the following instrument(s): Blade to remove Viable and Non-Viable tissue/material. Material removed includes Callus, Skin: Dermis, and Skin: Epidermis after achieving pain control using Lidocaine 5% topical ointment. No specimens were taken. A time out was  conducted at 13:18, prior to the start of the procedure. A Moderate amount of bleeding was controlled with Silver Nitrate. The procedure was tolerated well with a pain level of 0 throughout and a pain level of 0 following the procedure. Post Debridement Measurements: 0.4cm length x 0.4cm width x 0.2cm depth; 0.025cm^3 volume. Character of Wound/Ulcer Post Debridement is improved. Severity of Tissue Post Debridement is: Fat layer exposed. Post procedure Diagnosis Wound #5: Same as Pre-Procedure Plan Follow-up Appointments: Return Appointment in 1 week. Dressing Change Frequency: Wound #5 Left Metatarsal head fifth: Change dressing every day. Wound Cleansing: Wound #5 Left Metatarsal head fifth: May shower and wash wound with soap and water. Primary Wound Dressing: Wound #5 Left Metatarsal head fifth: Hydrofera Blue Other: - pad right great toe for protection Secondary Dressing: Kerlix/Rolled Gauze Dry Gauze Off-Loading: Other: - Fore foot off loader 1. Change the dressing to Hydrofera Blue 2. Same offloading forefoot off loader. I had some thoughts about a total contact cast although that did not help in fact we ended up with infection so while he had this. I am hoping to avoid going down the total contact cast. Electronic Signature(s) Signed: 12/02/2019 5:22:39 PM By: [name] MD Entered By: [name] on 12/02/2019 13:54:35 -------------------------------------------------------------------------------- SuperBill Details Patient Name: Date of Service: [name], [name] 12/02/2019 Medical Record [id number] Patient Account Number: [id number] Date of Birth/Sex: Treating RN: [birth year] (73 y.o. [name]) [name] Primary Care Provider: [name] Other Clinician: Referring Provider: Treating Provider/Extender:Levy Wellman, [name], [name] in Treatment: 18 Diagnosis Coding ICD-10 Codes Code Description E11.621 Type 2 diabetes mellitus with foot ulcer E11.51  Type 2 diabetes mellitus with diabetic peripheral angiopathy without gangrene L97.521 Non-pressure chronic ulcer of other part of left foot limited to breakdown of skin L03.116 Cellulitis of left lower limb Facility Procedures CPT4 Code Description: [id number] - DEBRIDE WOUND 1ST 20 SQ CM OR < ICD-10 Diagnosis Description L97.521 Non-pressure chronic ulcer of other part of left foot limited E11.621 Type 2 diabetes mellitus with foot ulcer Modifier: to breakdown Quantity: 1 of skin Physician Procedures CPT4 Code Description: [id number] - WC PHYS DEBR WO ANESTH 20 SQ CM ICD-10 Diagnosis Description L97.521 Non-pressure chronic ulcer of other part of left foot limited E11.621 Type 2 diabetes mellitus with foot ulcer Modifier: to breakdown Quantity: 1 of skin Electronic Signature(s) Signed: 12/02/2019 5:22:39 PM By: [name] MD Entered By: [name] on 12/02/2019 13:54:56

## 2019-12-09 ENCOUNTER — Other Ambulatory Visit: Payer: Self-pay

## 2019-12-09 ENCOUNTER — Ambulatory Visit (HOSPITAL_COMMUNITY)
Admission: RE | Admit: 2019-12-09 | Discharge: 2019-12-09 | Disposition: A | Discharge status: Home / Self Care | Payer: Medicare Other | Source: Ambulatory Visit | Attending: Internal Medicine | Admitting: Internal Medicine

## 2019-12-09 ENCOUNTER — Other Ambulatory Visit (HOSPITAL_COMMUNITY): Payer: Self-pay | Admitting: Internal Medicine

## 2019-12-09 ENCOUNTER — Encounter (HOSPITAL_BASED_OUTPATIENT_CLINIC_OR_DEPARTMENT_OTHER): Payer: Medicare Other | Attending: Internal Medicine | Admitting: Internal Medicine

## 2019-12-09 DIAGNOSIS — T148XXA Other injury of unspecified body region, initial encounter: Secondary | ICD-10-CM

## 2019-12-09 DIAGNOSIS — E11621 Type 2 diabetes mellitus with foot ulcer: Secondary | ICD-10-CM | POA: Diagnosis not present

## 2019-12-09 NOTE — Progress Notes (Addendum)
MACYN, REMMERT ([id number]) Visit Report for 12/09/2019 Arrival Information Details Patient Name: Date of Service: MACE, WEINBERG 12/09/2019 12:30 PM Medical Record [id number] Patient Account Number: [id number] Date of Birth/Sex: Treating RN: 1946-03-06 (74 y.o. [name] Primary Care Ciera Beckum: [name] Other Clinician: Referring Nakenya Theall: Treating Penelopi Mikrut/Extender:[name], [name], [name] in Treatment: 19 Visit Information History Since Last Visit Added or deleted any medications: No Patient Arrived: Ambulatory Any new allergies or adverse reactions: No Arrival Time: 12:52 Had a fall or experienced change in No Accompanied By: wife activities of daily living that may affect Transfer Assistance: None risk of falls: Patient Identification Verified: Yes Signs or symptoms of abuse/neglect since No Secondary Verification Process Yes last visito Completed: Hospitalized since last visit: No Patient Requires Transmission-Based No Implantable device outside of the clinic No Precautions: excluding Patient Has Alerts: No cellular tissue based products placed in the center since last visit: Has Dressing in Place as Prescribed: Yes Has Footwear/Offloading in Place as Yes Prescribed: Left: Wedge Shoe Pain Present Now: No Electronic Signature(s) Signed: 12/09/2019 5:30:42 PM By: Zandra Abts RN, BSN Entered By: Zandra Abts on 12/09/2019 12:53:08 -------------------------------------------------------------------------------- Encounter Discharge Information Details Patient Name: Date of Service: BROOKS, KINNAN 12/09/2019 12:30 PM Medical Record [id number] Patient Account Number: [id number] Date of Birth/Sex: Treating RN: 01-Aug-1946 (74 y.o. [name] Primary Care Kauri Garson: [name] Other Clinician: Referring Nylan Nakatani: Treating [name]/Extender:[name], [name], [name] in Treatment: 19 Encounter Discharge Information  Items Post Procedure Vitals Discharge Condition: Stable Temperature (F): 98.2 Ambulatory Status: Ambulatory Pulse (bpm): 75 Discharge Destination: Home Respiratory Rate (breaths/min): 18 Transportation: Private Auto Blood Pressure (mmHg): 106/53 Accompanied By: wife Schedule Follow-up Appointment: Yes Clinical Summary of Care: Patient Declined Electronic Signature(s) Signed: 12/09/2019 5:16:17 PM By: Cherylin Mylar Entered By: Cherylin Mylar on 12/09/2019 14:03:23 -------------------------------------------------------------------------------- Lower Extremity Assessment Details Patient Name: Date of Service: SHERARD, SUTCH 12/09/2019 12:30 PM Medical Record [id number] Patient Account Number: [id number] Date of Birth/Sex: Treating RN: 18-Feb-1946 (73 y.o. [name] Primary Care Samanvitha [place]: [name] Other Clinician: Referring Analea Muller: Treating Ajani Schnieders/Extender:[name], [name], [name] in Treatment: 19 Edema Assessment Assessed: [Left: No] [Right: No] Edema: [Left: Ye] [Right: s] Calf Left: Right: Point of Measurement: cm From Medial Instep 39.6 cm cm Ankle Left: Right: Point of Measurement: cm From Medial Instep 29 cm cm Vascular Assessment Pulses: Dorsalis Pedis Palpable: [Left:No] Electronic Signature(s) Signed: 12/09/2019 5:30:42 PM By: Zandra Abts RN, BSN Entered By: Zandra Abts on 12/09/2019 13:01:09 -------------------------------------------------------------------------------- Multi Wound Chart Details Patient Name: Date of Service: DEVEION, DENZ 12/09/2019 12:30 PM Medical Record [id number] Patient Account Number: [id number] Date of Birth/Sex: Treating RN: [birth year] (73 y.o. 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] Comorbid History: [5:Cataracts, Chronic sinus N/A problems/congestion, Coronary Artery Disease, Hypertension, Peripheral Arterial Disease, Type II Diabetes, Osteoarthritis, Neuropathy] Date Acquired: [5:07/14/2019] [N/A:N/A] Weeks of Treatment: [5:19] [N/A:N/A] Wound Status: [5:Open] [N/A:N/A] Measurements L x W x D 0.7x0.7x0.2 [N/A:N/A] (cm) Area (cm) : [5:0.385] [N/A:N/A] Volume (cm) : [5:0.077] [N/A:N/A] % Reduction in Area: [5:-511.10%] [N/A:N/A] % Reduction in Volume: -492.30% [N/A:N/A] Starting Position 1 12 (o'clock): Ending Position 1 [5:12] (o'clock): Maximum Distance 1 [  5:0.6] (cm): Undermining: [5:Yes] [N/A:N/A] Classification: [5:Grade 1] [N/A:N/A] Exudate Amount: [5:Small] [N/A:N/A] Exudate Type: [5:Serosanguineous] [N/A:N/A] Exudate Color: [5:red, brown] [N/A:N/A] Wound Margin: [5:Well defined, not attached N/A] Granulation Amount: [5:Medium (34-66%)] [N/A:N/A] Granulation Quality: [5:Pink, Pale, Friable] [N/A:N/A] Necrotic Amount: [5:Medium (34-66%)] [N/A:N/A] Exposed Structures: [5:Fat Layer (Subcutaneous Tissue) Exposed: Yes Fascia: No Tendon: No Muscle: No Joint: No Bone: No] [N/A:N/A] Epithelialization: [5:None] [N/A:N/A] Debridement: [5:Debridement - Excisional] [N/A:N/A] Pre-procedure [5:13:30] [N/A:N/A] Verification/Time Out Taken: Pain Control: [5:Other] [N/A:N/A] Tissue Debrided: [5:Callus, Subcutaneous] [N/A:N/A] Level: [5:Skin/Subcutaneous Tissue] [N/A:N/A] Debridement Area (sq cm):0.49 [N/A:N/A] Instrument: [5:Curette] [N/A:N/A] Bleeding: [5:Minimum] [N/A:N/A] Hemostasis  Achieved: [5:Pressure] [N/A:N/A] Procedural Pain: [5:0] [N/A:N/A] Post Procedural Pain: [5:0] [N/A:N/A] Debridement Treatment Procedure was tolerated [N/A:N/A] Response: [5:well] Post Debridement [5:0.7x0.7x0.2] [N/A:N/A] Measurements L x W x D (cm) Post Debridement [5:0.077] [N/A:N/A] Volume: (cm) Procedures Performed: Debridement [N/A:N/A] Treatment Notes Wound #5 (Left Metatarsal head fifth) 1. Cleanse With Wound Cleanser 2. Periwound Care Skin Prep 3. Primary Dressing Applied Polymem Ag 4. Secondary Dressing Dry Gauze 5. Secured With Tape Notes Insurance claims handler) Signed: 12/09/2019 5:08:08 PM By: Baltazar Najjar MD Signed: 12/09/2019 5:19:34 PM By: [name] RN Entered By: Baltazar Najjar on 12/09/2019 14:21:06 -------------------------------------------------------------------------------- Multi-Disciplinary Care Plan Details Patient Name: Date of Service: JASUN, GASPARINI 12/09/2019 12:30 PM Medical Record [id number] Patient Account Number: [id number] Date of Birth/Sex: Treating RN: 1946-04-28 (73 y.o. [name] Primary Care Giuseppe Duchemin: [name] Other Clinician: Referring Nishan Ovens: Treating Malyn Aytes/Extender:[name], [name], [name] in Treatment: 19 Active Inactive Wound/Skin Impairment Nursing Diagnoses: Knowledge deficit related to ulceration/compromised skin integrity Goals: Patient/caregiver will verbalize understanding of skin care regimen Date Initiated: 07/29/2019 Target Resolution Date: 01/02/2020 Goal Status: Active Ulcer/skin breakdown will have a volume reduction of 30% by week 4 Date Inactivated: 10/21/2019 Target Resolution Date Initiated: 07/29/2019 Date: 10/10/2019 Unmet Reason: comorbities. Goal Status: Unmet blood flow Ulcer/skin breakdown will have a volume reduction of 50% by week 8 Date Initiated: 10/21/2019 Date Inactivated: 12/02/2019 Target Resolution Date: 11/21/2019 Goal Status: Unmet Unmet  Reason: comorbities Ulcer/skin breakdown will have a volume reduction of 80% by week 12 Date Initiated: 12/02/2019 Target Resolution Date: 01/02/2020 Goal Status: Active Interventions: Assess patient/caregiver ability to obtain necessary supplies Assess patient/caregiver ability to perform ulcer/skin care regimen upon admission and as needed Assess ulceration(s) every visit Notes: Electronic Signature(s) Signed: 12/09/2019 5:19:34 PM By: [name] RN Entered By: [name] on 12/09/2019 13:13:44 -------------------------------------------------------------------------------- Pain Assessment Details Patient Name: Date of Service: TREVANTE, TENNELL 12/09/2019 12:30 PM Medical Record [id number] Patient Account Number: [id number] Date of Birth/Sex: Treating RN: 1946/04/10 (74 y.o. [name] Primary Care Jacquita Mulhearn: [name] Other Clinician: Referring Tonya Carlile: Treating Lashon Hillier/Extender:[name], [name], [name] in Treatment: 19 Active Problems Location of Pain Severity and Description of Pain Patient Has Paino No Site Locations Pain Management and Medication Current Pain Management: Electronic Signature(s) Signed: 12/09/2019 5:30:42 PM By: Zandra Abts RN, BSN Entered By: Zandra Abts on 12/09/2019 12:54:55 -------------------------------------------------------------------------------- Patient/Caregiver Education Details Patient Name: Date of Service: Jeraldine Loots 2/9/2021andnbsp12:30 PM Medical Record [id number] Patient Account Number: [id number] Date of Birth/Gender: 05/10/1946 (74 y.o. M) Treating RN: [name] Primary Care Physician: [name] Other Clinician: Referring Physician: Treating Physician/Extender:[name], [name], [name] in Treatment: 19 Education Assessment Education Provided To: Patient Education Topics Provided Wound/Skin Impairment: Methods: Explain/Verbal Responses: State content  correctly Electronic Signature(s) Signed: 12/09/2019 5:19:34 PM By: [name] RN Entered By: [name] on 12/09/2019 13:14:06 -------------------------------------------------------------------------------- Wound Assessment Details Patient Name: Date of Service: HERLEY, BERNARDINI 12/09/2019  12:30 PM Medical Record [id number] Patient Account Number: [id number] Date of Birth/Sex: Treating RN: 1946-08-31 (73 y.o. [name]) [name] Primary Care Amiah Frohlich: [name] Other Clinician: Referring Sorren Vallier: Treating Shanley Furlough/Extender:[name], [name], [name] in Treatment: 19 Wound Status Wound Number: 5 Primary Diabetic Wound/Ulcer of the Lower Extremity Etiology: Wound Location: Left Metatarsal head fifth Wound Open Wounding Event: Gradually Appeared Status: Date Acquired: 07/14/2019 Comorbid Cataracts, Chronic sinus problems/congestion, Weeks Of Treatment: 19 History: Coronary Artery Disease, Hypertension, Clustered Wound: No Peripheral Arterial Disease, Type II Diabetes, Osteoarthritis, Neuropathy Photos Wound Measurements Length: (cm) 0.7 % Reduction in Width: (cm) 0.7 % Reduction in Depth: (cm) 0.2 Epithelializat Area: (cm) 0.385 Tunneling: Volume: (cm) 0.077 Undermining: Starting Po Ending Posi Maximum Dis Area: -511.1% Volume: -492.3% ion: None No Yes sition (o'clock): 12 tion (o'clock): 12 tance: (cm) 0.6 Wound Description Classification: Grade 1 Foul Odor Aft Wound Margin: Well defined, not attached Slough/Fibrin Exudate Amount: Small Exudate Type: Serosanguineous Exudate Color: red, brown Wound Bed Granulation Amount: Medium (34-66%) Granulation Quality: Pink, Pale, Friable Fascia Exposed Necrotic Amount: Medium (34-66%) Fat Layer (Sub Necrotic Quality: Adherent Slough Tendon Exposed Muscle E Joint Ex Bone Exp er Cleansing: No o Yes Exposed Structure : No cutaneous Tissue) Exposed: Yes : No xposed: No posed: No osed:  No Treatment Notes Wound #5 (Left Metatarsal head fifth) 1. Cleanse With Wound Cleanser 2. Periwound Care Skin Prep 3. Primary Dressing Applied Polymem Ag 4. Secondary Dressing Dry Gauze 5. Secured With Tape Notes Freight forwarder) Signed: 12/10/2019 4:31:42 PM By: Mikeal Hawthorne EMT/HBOT Signed: 12/10/2019 4:53:37 PM By: [name] RN Previous Signature: 12/09/2019 5:30:42 PM Version By: Levan Hurst RN, BSN Entered By: Mikeal Hawthorne on 12/10/2019 14:45:43 -------------------------------------------------------------------------------- Strawn Details Patient Name: Date of Service: AVANTAE, BITHER 12/09/2019 12:30 PM Medical Record [id number] Patient Account Number: [id number] Date of Birth/Sex: Treating RN: [birth year] (74 y.o. [name] Primary Care Lawanda Holzheimer: [name] Other Clinician: Referring Kelson Queenan: Treating Mayleigh Tetrault/Extender:[name], [name], [name] in Treatment: 19 Vital Signs Time Taken: 12:53 Temperature (F): 98.2 Height (in): 69 Pulse (bpm): 75 Weight (lbs): 210 Respiratory Rate (breaths/min): 18 Body Mass Index (BMI): 31 Blood Pressure (mmHg): 106/53 Capillary Blood Glucose (mg/dl): 116 Reference Range: 80 - 120 mg / dl Notes glucose per pt report Electronic Signature(s) Signed: 12/09/2019 5:30:42 PM By: Levan Hurst RN, BSN Entered By: Levan Hurst on 12/09/2019 12:54:48

## 2019-12-09 NOTE — Progress Notes (Signed)
Todd, Guzman ([id number]) Visit Report for 12/09/2019 Debridement Details Patient Name: Date of Service: Todd, Guzman 12/09/2019 12:30 PM Medical Record [id number] Patient Account Number: [id number] Date of Birth/Sex: [birth year] (74 y.o. M) Treating RN: [name] Primary Care Provider: Rory Guzman Other Clinician: Referring Provider: Treating Provider/Extender:[name], [name], [name] in Treatment: 19 Debridement Performed for Wound #5 Left Metatarsal head fifth Assessment: Performed By: Physician [name]., MD Debridement Type: Debridement Severity of Tissue Pre Fat layer exposed Debridement: Level of Consciousness (Pre- Awake and Alert procedure): Pre-procedure Verification/Time Out Taken: Yes - 13:30 Start Time: 13:30 Pain Control: Other : benzocaine 20% Total Area Debrided (L x W): 0.7 (cm) x 0.7 (cm) = 0.49 (cm) Tissue and other material Viable, Non-Viable, Callus, Subcutaneous, Skin: Dermis , Skin: Epidermis debrided: Level: Skin/Subcutaneous Tissue Debridement Description: Excisional Instrument: Curette Bleeding: Minimum Hemostasis Achieved: Pressure End Time: 13:33 Procedural Pain: 0 Post Procedural Pain: 0 Response to Treatment: Procedure was tolerated well Level of Consciousness Awake and Alert (Post-procedure): Post Debridement Measurements of Total Wound Length: (cm) 0.7 Width: (cm) 0.7 Depth: (cm) 0.2 Volume: (cm) 0.077 Character of Wound/Ulcer Post Improved Debridement: Severity of Tissue Post Debridement: Fat layer exposed Post Procedure Diagnosis Same as Pre-procedure Electronic Signature(s) Signed: 12/09/2019 5:08:08 PM By: Todd Ham MD Signed: 12/09/2019 5:19:34 PM By: Todd Coria RN Entered By: [name] on 12/09/2019 14:21:36 -------------------------------------------------------------------------------- HPI Details Patient Name: Date of Service: [name] 12/09/2019 12:30 PM Medical Record  [id number] Patient Account Number: [id number] Date of Birth/Sex: Treating RN: [birth year] (74 y.o. [name] Primary Care Provider: Rory Guzman Other Clinician: Referring Provider: Treating Provider/Extender:[name], [name], [name] in Treatment: 19 History of Present Illness HPI Description: 05/18/16; this is a 74year-old diabetic who is a type II diabetic on insulin. The history is that he traumatized his right foot developed a sore sometime in late March. 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 According to the patient and his wife this wound never really healed. He was seen on 2 occasions in the wound care center in [place] and had vascular studies and then was referred urgently to Dr. Bridgett Guzman of vascular surgery. He underwent an angiogram on 05/10/16. Unfortunately nothing really could be done to improve his vascular status. He had a 75-90% stenosis in the midsegment of 1 segment of the posterior femoral artery. He had a patent popliteal, his anterior tibial occluded shortly after takeoff. Perineal had a greater than 90% stenosis posterior tibial is occluded feet had no distal collaterals feed distal aspect of the transmetatarsal amputation site. The patient tells me that he had a prolonged period of Santyl by Dr. Doran Guzman was some initial improvement but then this was stopped. I think they're only applying daily dressings/dry dressings. He has not had a recent x-ray of the right foot he did have one before his surgery in April. His wife by the dimensions of the wound/surgical site being followed at home since 4/20. At that point the dimensions were 0.5 x 12 x 0.2 on 7/19 this was 1.8 x 6 x 0.4. He is not currently on any antibiotics. His  hemoglobin A1c in early April was 12.9 at that point he was started on insulin. Apparently his blood sugars are much lower he has an appointment with  Dr. Legrand Como Guzman of endocrine next week. 05/29/16 x-ray of the area did not show osteomyelitis. I think he probably needs an MRI at this point. His wife is asking about something called"Yireh" cream which is not FDA approved. I have not heard of this. 06/22/16; MRI did not really suggest osteomyelitis. There was minimal marrow edema and enhancement in the stump of the second metatarsal felt to be secondary likely to postoperative change rather than osteomyelitis. The patient has arranged his own consultation with Dr. Andree Guzman at [facility], apparently their daughter lives in [place] and has some connection here. Any improvement in vascular supply by Dr. Andree Guzman would of course be helpful. Dr. Bridgett Guzman did not feel that anything further could be done other than amputation if wound care did not result in healing or if the area deteriorates. 06/26/16; the patient has been to see Dr. Andree Guzman at [place] and had an angiogram. He is going for a procedure on Thursday which will involve catheterization. I'm not sure if this is an anterograde or retrograde approach. He has been using Santyl to the wound 07/10/16; the patient had a repeat angiogram and angioplasty at [facility] by Dr. Brunetta Guzman. His angiogram showed right CFA and profundal widely patent. The right as of a.m. popliteal artery were widely patent the right anterior tibial was occluded proximally and reconstitutes at the ankle. Peroneal artery was patent to the foot. Posterior tibial artery was occluded. The patient had angioplasty of the anterior tibial artery.. This was quite successful. He was recommended for Plavix as well as aspirin. 07/17/16; the patient was close to be a nurse visit today however the outer dressing of the Apligraf fell off. Noted drainage. I was asked to see the wound. The patient is noted an odor  however his wife had noted that. Drainage with Apligraf not necessarily a bad thing. He has not been systemically unwell 07/24/16; we are still have an issue with drainage of this wound. In spite of this I applied his second Apligraf. Medially the area still is probing to bone. 08/07/16; Apligraf reapplied in general wound looks improved. 08/21/16 Apligraf #4. Wound looks much better 09/04/16 patientt's wound again today continues to appear to improve with the application of the Apligraf's. He notes no increased discomfort or concerns at this point in time. 09/18/16; the patient returns today 2 weeks after his fifth application of Apligraf. Predictably three quarters of the width of this wound has healed. The deep area that probe to bone medially is still open. The patient asked how much out-of-pocket dollars would be for additional Apligraf's. 09/25/16; now using Hydrofera Blue. He has completed 5 Apligraf applications with considerable improvement in this deep open transmetatarsal amputation site. His wound is now a triangular-shaped wound on the medial aspect. At roughly 12 to 2:00 this probes another centimeter but as opposed to in the past this does not probe to bone. The patient has been seen at [facility] by Dr. Zenia Resides. He is not planning to do any more revascularization unless the wound stalls or worsens per the patient 10/02/16; 0.7 x 0.8 x 0.8. Unfortunately although the wound looks stable to improved. There is now easily probable bone. This hasn't been present for several weeks. Patient is not otherwise symptomatic he is not experiencing any pain. I did a culture of the wound bed 10/09/16. Deterioration last week. Culture grew MRSA and although there is improvement here with doxycycline prescribed over the phone I'm going to try to get him linezolid 600 twice a day  for 10 days today. 10/16/16; he is completing a weeks worth of linezolid and still has 3 more days to go. Small triangular-shaped  open area with some degree of undermining. There is still palpable bone with a curet. Overall the area appears better than last week 10/20/16 he has completed the linezolid still having some nausea and vomiting but no diarrhea. He has exposed bone this week which is a deterioration. 10/27/16 patient now has a small but probing wound down to bone. Culture of this bone that I did last week showed a few methicillin-resistant staph aureus. I have little doubt that this represents acute/subacute osteomyelitis. The patient is currently on Doxy which I will continue he also completed 10 days of linezolid. We are now in a difficult situation with this patient's foot after considerable discussion we will send him back to see Dr. Doran Guzman for a surgical opinion of this I'm also going to try to arrange a infectious disease consult at [facility] hopefully week and get this prior to her usual 4-6 weeks we having Bier. The patient clearly is going to need 6 weeks of IV vancomycin. If we cannot arrange this expediently I'll have to consider ordering this myself through a home infusion company 11/03/16; the patient now has a small in terms of circumference but probing wound. No bone palpable today. The patient remains on doxycycline 100 twice a day which should support him until he sees infectious disease at [facility] next week the following week on Wednesday I believe he has an appointment with Dr. Andree Guzman at [facility] who is his vascular cardiologist. Finally he has an appointment with Dr. Doran Guzman on 11/22/16 we have been using silver alginate. The patient's wife states they are having trouble getting this through [facility] 11/13/16; the patient was seen by infectious disease at [facility] in the 11th PICC line placed in preparation for IV antibiotics. A tummy he has not going to get IV vancomycin o Ceftaroline. They've also ordered an MRI. Patient has a follow-up with Dr. Doran Guzman on 11/22/16 and Dr. Andree Guzman at [facility]  tomorrow 11/23/16 the patient is on daptomycin as directed by infectious disease at [facility]. He is also been back to see Dr. Andree Guzman at [facility]. He underwent a repeat arteriogram. He had a successful PTA of the right anterior tibial artery. He is on dual antiplatelete treatment with Plavix and aspirin. Finally he had the MRI of his foot in [place]. This showed cellulitis about the foot worse distally edema and enhancement in the reminiscent of the second metatarsal was consistent with osteomyelitis therefore what I was assuming to be the first metatarsal may be actually the second. He also has a fluid collection deep to the calcaneus at the level of the calcaneal spur which could be an abscess or due to adventitial bursitis. He had a small tear in his Achilles 11/30/16; the patient continues on daptomycin as directed by infectious disease at [facility]. He is been revascularized by Dr. Andree Guzman at [facility] in [place]. He has been to see Gretta Arab who was the orthopedic surgeon who did his original amputation. I have not seen his not however per the patient's wife he did not offer another surgical local surgical prodecure to remove involved bone. He verbalized his usual disbelief in not just hyperbarics but any medical therapy for this condition(osteomyelitis). I discussed this in detail with the patient today including answering the question about a BKA definitively "curing" the current condition. 12/07/16; the patient continues on daptomycin as directed  by infectious disease at [facility]. This is directed at the MRSA that we cultured from his bone debridement from 12/22. Lab work today shows a white count of 8.7 hemoglobin of 10.5 which is microcytic and hypochromic differential count shows a slightly elevated monocyte count at 1.2 eosinophilic count of 0.6. His creatinine is 1.11 sedimentation rate apparently is gone from 35-34 now 40. I explained was wife I don't think this represents a trend. His  total CK is 48 12/14/16- patient is here for follow-up evaluation of his right TMA site. He continues to receive IV daptomycin per infectious disease. His serum inflammatory markers remain elevated. He complains of intermittent pain to the medial aspect of the TMA site with intermittent erythema. He voices no complaints or concerns regarding hyperbaric therapy. Overall he and his wife are expressing a frustration and discouragement regarrding the length of time of treatment. 12/21/16; small open wound at roughly the first or second metatarsal metatarsalphalyngeal joint reminiscence of his transmetatarsal amputation site he continues to receive IV daptomycin per infectious disease at [facility]. He will finish these a week tomorrow. He has lab work which I been copied on. His white count is 10.8 hemoglobin 8.9 MCV is low at 75., MCH low at 24.5 platelet count slightly elevated at 626. Differential count shows 70% neutrophils 10% monocytes and 10% eosinophils. His comprehensive metabolic panel shows a slightly low sodium at 133 albumin low at 3.1 total CK is normal at 42 sedimentation rate is much higher at 82. He has had iron studies that show a serum iron of 15 and iron binding capacity of 238 and iron saturation of 6. This is suggestive of iron deficiency. B12 and folate were normal ferritin at 113 The patient tells me that he is not eating well and he has lost weight. He feels episodically nauseated. He is coughing and gagging on mucus which she thinks is sinusitis. He has an appointment with his primary doctor at 5:00 this afternoon in [facility] 01/02/17; the patient developed a subacute pneumonitis. He was admitted to [facility] after a CT scan showed an extensive interstitial pneumonitis [I have not yet seen this]. 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 He has since been discharged. He  is not on oxygen. He feels fatigued and very short of breath with exertion. At [facility] the wound care nurse there felt that his wound was healed. He did complete his daptomycin and has follow-up with infectious disease on Friday. X-rays I did before he went to [facility] still suggested residual osteomyelitis in the anterior aspect of the second must metatarsal head. I'm not sure I would've expected any different. There was no other findings. I actually think I did this because of erythema over the first metatarsal head reminiscent 01/11/17; the patient has eosinophilic pneumonitis. He has been reviewed by pulmonology and given clearance for hyperbarics at least that's what his wife says. I'll need to see if there is note in care everywhere. Apparently the prognosis for improvement of daptomycin induced eosinophilic granulocyte is is 3 months without steroids. In the meantime infectious disease has placed him on doxycycline until the wound is closed. He is still is lost a lot of weight and his blood sugars are running in the mid 60s to low 80s fasting and at all times during the day 01/18/17; he has had adjustments in his insulin apparently his blood sugars in the morning or over 100.  He wants to restart his hyperbaric treatment we'll do this at 1:00. 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 I think there is good reason to complete his treatments in order to give him the best chance of maintaining a healed status and these DFU 3 wounds with MRSA infection in the bone 02/15/17; the patient was seen today in conjunction with HBO. He completed hyperbaric oxygen today. The open area on his transmetatarsal site has remained closed. There was an area of erythema when I saw him earlier in the week on the posterior heel although that is resolved as of today as well. He has been using a cam walker. This is a patient who came  to [place] after a transmetatarsal amputation that was necrotic and dehisced. He required revascularization percutaneously on 2 different occasions by Dr. Andree Guzman of invasive cardiology at [facility]. 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 He went to [facility] infectious disease and had IV daptomycin for 5 weeks before developing eosinophilic pneumonitis and requiring an admission to hospital/ICU. He made a good recovery and is continued on doxycycline since. As mentioned his foot is closed now. He has a follow-up with Dr. Andree Guzman tomorrow. He is going to [employer] on Monday for a custom-made shoe READMISSION Last visit Dr. Andree Guzman [facility] 02/16/17 1. Critical limb ischemia of the RLE: s/p transmetatarsal amputation of the RLE, now completely healed. s/p right ATA percutaneous revascularization procedure x2, most recently 11/20/2016 s/p PTA of right AT 100% to less than 20% with a 2.5 x 200 balloon. He does have some residual osteomyelitis, but given the wound is closed, the orthopedist has recommended to follow. He has a fitting for a special shoe coming up next week. He has completed a course of daptomycin and doxycycline and he has been discharged by ID. He has completed a course of hyperbaric therapy. Continue Continue medical management with aspirin, Plavix and statin therapy. We will discuss ongoing Plavix therapy at follow-up in 6 months. 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 Given no symptoms, we will conservatively manage. He doesn't want to do any more invasive studies at this time, which is reasonable. The patient arrives today out of 2 concerns both on the right transmetatarsal site. 1 at the level of the reminiscent fifth metatarsal head and the other at roughly  the first or second. Both of these look like dark subcutaneous discoloration probably subdermal bleeding. He is recently obtained new adaptive footwear for the right foot. He also has a callus on the left fifth dorsal toe however he follows with podiatry for this and I don't think this is any issue. ABIs in this clinic today were 0.66 on the right and 1.06 on the left. On entrance into our clinic initially this was 0.95 and 0.92. He does not describe current claudication. They're going away on a cruise in 8 weeks and I think are trying to do the month is much as they can proactively. They follow with podiatry and have an appointment with Dr. Andree Guzman in October READMISSION 06/25/18 This is a patient that we have not seen in almost a year. He is a type II diabetic with known PAD. He is followed by Dr. Andree Guzman of interventional cardiology at [facility]  in [place]. He is required revascularization for significant PAD. When we first saw him he required a transmetatarsal amputation. He had underlying osteomyelitis with a nonhealing surgical wound. This eventually closed with wound care, IV antibiotics and hyperbaric oxygen. They tell me that he has a modified shoe and he is very active walking up to 4 miles a day. He is followed by Dr. Geroge Baseman of podiatry. His wife states that he underwent a removal of callus over this site on July 9. She felt there may be drainage from this site after that although she could never really determined and there was callus buildup again. On 06/01/18 there was pressure bleeding through the overlying callus. he was given a prescription for 7 days of Bactrim. Fortuitously he has an appointment with Dr. Andree Guzman on 06/04/18 and he immediately underwent revascularization of the right leg although I have not had a chance to review these records in care everywhere. This was apparently done through anterior and retrograde access. On 06/06/18 he had another debridement by Dr. Bernette Mayers. Vitamin  soaking with Epsom salts for 20 minutes and applying calcium alginate. The original trans-met was on April 2017 I believe by Dr. Doran Guzman. His ABI in our clinic was noncompressible today. 07/02/18; x-ray I ordered last week was negative for osteomyelitis. Swab culture was also negative. He is going to require an MRI which I have ordered today. 07/09/18; surprisingly the MRI of the foot that I ordered did not show osteomyelitis. He did suggest the possibility of cellulitis. For this reason I'll go ahead and give him a 10 day course of doxycycline. Although the previous culture of this area was negative 07/16/18; it arrives with the wound looking much the same. Roughly the same depth. He has thick subcutaneous tissue around the wound orifice but this still has roughly the same depth. Been using silver alginate. We applied Oasis #1 today 07/23/2018; still having to remove a lot of callus and thick subcutaneous tissue to actually define the wound here. Most of this seems to have closed down yet he has a comma shaped divot over the top of the area that still I think is open. We applied Oasis #2 His wife expressed concern about the tip of his left great toe. This almost looks like a small blister. She also showed it today to her podiatrist Dr. Geroge Baseman who did not think this was anything serious. I am not sure is anything serious either however I think it bears some watching. He is not in any pain however he is insensate 07/30/2018;; still on a lot of nonviable tissue over the surface of the wound however cleaning this up reveals a more substantial wound orifice but less of a probing wound depth. This is not probed to bone. There is no evidence of infection The wife is still concerned about a non-open area on the tip of his left great toe. Almost feels like a bony outgrowth. She had previously showed this to podiatry. I do not think this is a blister. A friction area would be possible although he is really not  walking according to his wife. He had a small skin tag on his right buttock but no open wound here either 08/06/2018; we applied a third Oasis last week. Unfortunately there is really no improvement. Still requiring extensive debridement to expose the wound bed from a horizontal slitlike depression. I still have not been able to get this to fill in properly. On the positive side there is now no probable bone  from when he first came into the facility. I changed him to silver alginate today after a reasonably aggressive debridement 08/13/2018; once again the patient comes in with skin and subcutaneous tissue closing over the small probing area with the underlying cavity of the wound on the right TMA site. I applied silver alginate to this last week. Prior to that we used Oasis x3 still not able to get this area granulating. He does not have a probing area of the bone which is an improvement from when I spur started working on this and an MRI did not suggest osteomyelitis. I do not see evidence of infection here but I am increasingly concerned about why I cannot get this area to granulate. Each time I debrided this is looks like this is simply a matter of getting granulation to fill in the hole and then getting epithelialization. This does not seem to happen Also I sent him back to see podiatry Dr. Earleen Newport about the what felt to be bony outgrowth on the tip of his left great toe. Apparently after heel he left the clinic last week or the next day he developed a blood blister. He did see Dr. Earleen Newport. He went on to have a debridement of the medial nail cuticle he now has an open area here as well as some denuded skin. They did an x-ray apparently does have a bony outgrowth or spur but I am not able to look at this. They are using topical antibiotics apparently there was some suggested he use Santyl. Patient's wife was anxious for my opinion of this 08/20/2018; we are able to keep the wound open this time  instead of the thick subcutaneous tissue closing over the top of it however unfortunately once again this probes to bone. I do not see any evidence of infection and previous MRI did not show osteomyelitis. I elected to go back to the Oasis to see if we can stimulate some granulation. Last saw his vascular interventional cardiologist Dr. Andree Guzman at the beginning of August and he had a repeat procedure. Nevertheless I wonder how much blood flow he has down to this area With regards to the left first toe he is seeing Dr. Earleen Newport next week. They are applying Bactroban to this area. 08/27/2018 Once again he comes in with thick eschar and subcutaneous tissue over the top of the small probing hole. This does not appear to go down to bone but it still has roughly the same depth. I reapplied Oasis today Over the left great toe there appears to be more of the wound at the tip of his toe than there was last week. This has an eschar on the surface of it. Will change to Santyl. There is seeing podiatry this afternoon 09/03/2018 He comes in today with the area on the transmetatarsal's site with a fair amount of callus, nonviable tissue over the circumference but it was not closed. I removed all of this as well as some subcutaneous debris and reapplied Oasis. There is no exposed bone The area over the tip of the left great toe started off as a nodule of uncertain etiology. He has been followed with podiatry. They have been removing part of the medial nail bed. He has nonviable tissue over the wound he been using Santyl in this area 09/10/18 Unfortunately comes in with neither wound area looking improved. The transmetatarsal amputation site once Again has nonviable debris over the surface requiring debridement. Unfortunately underneath this there is nothing that looks viable  and this once again goes right down to bone. There is no purulent drainage and no erythema. Also the surgical wound from podiatry on the left  first toe has an ischemic-looking eschar over the surface of the tip of the toe I have elected not to attempt candidly debride this His wife as arranged for him to have follow-up noninvasive studies in [place] under the care of Dr. Andree Guzman clinic. The question is he has known severe PAD. They had recently seen him in August. He has had revascularizations in both legs within the last 4 or 5 months. He is not complaining of pain and I cannot really get a history of claudication. He has not systemically unwell 09/20/2018 the patient has been to [facility] and been revascularized by Dr. Andree Guzman earlier this week. Apparently he was able to open up the anterior tibial artery although I have not actually seen his formal report. He is going for an attempt to revascularize on the left on Monday. He is apparently working with an investigational stent for lower extremity arteries below the knee and he has talked to the patient about placing that on Monday if possible. The patient has been using silver alginate on the transmetatarsal amputation site and Santyl on the left 09/30/2018; patient had his revascularization on the left this apparently included a standard approach as well as a more distal arterial catheterization although I do not have any information on this from Dr. Andree Guzman. In fact I do not even see the initial revascularization that he had on the right. I have included the arterial history from Dr. Andree Guzman last note however below; ASSESSMENT/PLAN: 1. Hx of Critical limb ischemia bilateral lower extremities, PAD: -s/p transmetatarsal amputation of the RLE. -s/p right ATA percutaneous revascularization procedure x2, most recently 11/20/2016 s/p PTA of right AT 100% to less than 20% with a 2.5 x 200 balloon. 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 -03/21/2018 s/p PTA of  90% left popliteal artery to <10% with a 5x20 cutting balloon, PTA of 90% left peroneal to <20% with a 3x20 balloon, PTA of 100% left AT to <20% with a 2.5x220 balloon. 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 Will plan on the LLE soon after. Risks/benefits of procedure have been discussed and patient has elected to proceed. We have been using endoform to the right TMA amputation site wound and Santyl to the left great toe 10/07/2018; the area on the tip of his left great toe looked better we have been using Santyl here. We continue to have a very difficult probing hole on the right TMA amputation site we have been using endoform. I went on to use his fifth Oasis today 10/14/2018; the tip of the left great toe continues to look better. We have been using Santyl here the surface however is healthy and I think we can change to an alginate. The right TMA has not changed. Once again he has no superficial opening there is callus and thick subcutaneous tissue over the orifice once you remove this there is the probing area that we have been dealing with without too much change. There is no palpable bone I have been placing Oasis here and put Oasis #6 in this today after a more  vigorous debridement 10/21/18; the left great toe still has necrotic surface requiring debridement. The right TMA site hasn't changed in view of the thick callus over the wound bed. With removal of this there is still the opening however this does not appear to have the same depth. Again there is no palpable bone. Oasis was replaced 10/28/2018; patient comes in with both wounds looking worse. The area over the first toe tip is now down to bone. The area over the TMA site is deeper and down to bone clearly with a increase in overall wound area. Equally concerning on the right TMA is the  complete absence of a pulse this week which is a change. We are not even able to Doppler this. On the right he has a noncompressible ABI greater than 1.4 11/04/2018. Both wounds look somewhat worse. X-rays showed no osteomyelitis of the right foot but on the left there was underlying osteomyelitis in the left great toe distal phalanx. I been on the phone to Dr. Andree Guzman surface at [facility] in [place] and they are arranging for another angiogram on the right on Monday. I have him on doxycycline for the osteomyelitis in the left great toe for now. Infectious disease may be necessary 1/17; 2-week hiatus. Patient was admitted to hospital at [place]. My understanding is he underwent an angioplasty of the right anterior tibial artery and had stents placed in the left anterior artery and the left tibial peroneal trunk. This was done by Dr. Andree Guzman of interventional radiology. There is no major change in either 1 of the wounds. They have been using Aquacel Ag. As far as they are aware no imaging studies were done of the foot which is indeed unfortunate. I had him on doxycycline for 2 weeks since we identified the osteomyelitis in the left great toe by plain x-ray. I have renewed that again today. I am still suspicious about osteomyelitis in the amputation site and would consider doing another MRI to compare with the one done in September. The idea of hyperbaric oxygen certainly comes up for discussion 1/24; no major change in either wound area. I have him on doxycycline for osteomyelitis at the tip of the left great toe. As noted he has been previously and recently revascularized by Dr. Andree Guzman at [place]. He is tolerating the doxycycline well. For some reason we do not have an infectious disease consult yet. Culture of drainage from the right foot site last week was negative 1/31; MRI of the right foot did not show osteomyelitis of the right ankle and foot. Notable for a skin ulceration overlying the second  metatarsal stump with generalizing soft tissue edema of the ankle and foot consistent with cellulitis. Noted to have a partial-thickness tear of the Achilles tendon 7.5 cm proximal to the insertion Nothing really new in terms of symptoms. Patient's area on the tip of the left great toe is just about closed although he still has the probing area on the metatarsal amputation site. Appointment with Dr. Linus Salmons of infectious disease next week 2/7; Dr. Novella Olive did not feel that any further antibiotics were necessary he would follow-up in 2 months. The left great toe appears to be closed still some surface callus that I gently looked under high did not see anything open or anything that was threatening to be open. He still has the open area on the mid part of his TMA site using endoform 2/14; left great toe is closed and he is completing his doxycycline as of last  Sunday. He is not on any antibiotics. Unfortunately out of the right foot his wife noticed some subdermal hemorrhage this week. He had been walking 2 miles I had given him permission to do so this is not really a plantar wound. Using endoform to this wound but I changed to silver alginate this week 2/21; left great toe remains closed. Culture last week grew Streptococcus angiosis which I am not really familiar with however it is penicillin sensitive and I am going to put him on Augmentin. Not much change in the wound on the right foot the deep area is still probing precariously close to bone and the wound on the margin of the TMA is larger. 2/28; left great toe remains closed. He is completing the Augmentin I gave him last week. 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 This is being shown to Dr. Andree Guzman at [place] to see if there is anything else that can be done here. He has been using silver alginate strips on the right 3/6; left great toe remains closed. He sees Dr. Andree Guzman on Monday. The area on the plantar  aspect of the right foot has the same small orifice with thick callused tissue around this. However this time with removal of the callus tissue the wound is open all the way along the incision line to the end medially. We have been using silver alginate. 3/13; left toe remains closed although the area is callused. He sees Dr. Jacqualyn Posey of podiatry next week. The area on the plantar right foot looked a lot better this week. Culture I did of this was negative we use silver alginate. He is going next week for an attempt at revascularization by Dr. Andree Guzman 3/23; left toe remains closed although the area is callused. He will see Dr. Jacqualyn Posey in follow-up. The area on the right plantar foot continues to look surprisingly better over the last 3 visits. We have been using silver alginate. The revascularization he was supposed to have by Dr. Andree Guzman at [facility] in [place] has been canceled [facility] procedure] 4/6; the right foot remains closed albeit callused. Podiatry canceled the appointment with regards to the left great toe. He has not seen Dr. Andree Guzman at [facility] but thinks that Dr. Andree Guzman has "done all he can do". He would be a candidate for the hemostaemix trial Readmission 07/29/2019 Mr. [name] is a man we know well from at least 3 previous stays in this clinic. He is a type II diabetic with severe PAD followed by Dr. Andree Guzman at [facility] in [place]. 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 So far everything in both of these areas has remained closed. About 2 weeks ago he went to see his podiatrist [facility] Dr. Babs Bertin. He had a thick callus on the left fifth metatarsal head that was shaved. He has developed an open wound in this area. They have been offloading this in his diabetic shoes. The patient has not had any more revascularizations by Dr. Andree Guzman since the last  time he was here. ABI in our clinic at the posterior tibial on the left was 1.06 10/6; no real change in the area on the plantar met head. Small wound with 2 mm of depth. He has thick skin probably from pressure around the wound. We have been using silver  alginate 10/13; small wound in the left fifth plantar met head. Arrives today with undermining laterally and purulent drainage. Our intake nurse cultured this. His wife stated they noticed a change in color over the last day or 2. He is not systemically unwell 10/19; small wound on the fifth plantar metatarsal head. Culture I did last week showed Staphylococcus lugdunensis. Although this could be a skin contaminant the possibility of a skin and soft tissue infection was there. I did give him empiric doxycycline which should have covered this. We are using silver alginate to the wound. We put him in a total contact cast today. 10/22; small wound on the fifth plantar metatarsal head. He has completed antibiotics. He also has severe PAD which worries me about just about any wound on this man's foot 10/29; small superficial area on the fifth plantar metatarsal head. Measuring slightly smaller. He is not currently on any antibiotics. I been using a total contact cast in this man with very severe PAD but he seems to be tolerating this well 11/5; left plantar fifth metatarsal head. Silver alginate being used under a total contact cast 11/12; wound not much different than last week. Using a #15 scalpel debridement around the wound. Still silver collagen under a total contact cast. He has severe PAD and that may be playing a role in this 11/19; disappointing that the wound is not really changed that much. I think it is come down in overall surface area because originally this was on the lateral part of the fifth metatarsal head however recently it is not really changed. Most of this is filled in but it will not epithelialized there is surface debris on  this which may be mostly related to ischemia. They have an appointment with Dr. Andree Guzman on 12/9 09/24/2019 on evaluation today patient appears to be doing somewhat better with regard to the wound on the left fifth metatarsal head. Fortunately there does not appear to be any signs of active infection at this time. No fevers, chills, nausea, vomiting, or diarrhea. The wound does not appear to be completely closed and I do feel like the Hydrofera Blue is helping to some degree although I feel like the cast as well as keeping things from breaking down or getting any larger at least. As far as his wound overview it shows that the overall size of the wound is slightly smaller but really maintaining within the realm of about the same. He had no troubles with the cast which is good news. 12/1; left fifth metatarsal head. Perhaps somewhat more vibrant. We have been using Hydrofera Blue. He sees Dr. Andree Guzman at [place] 1 week tomorrow. He be back next week and I hope to have a better idea which way the wound is going however I will not cast him next week in case Dr. Andree Guzman wants to reevaluate things in his foot. The left leg is the leg with the stent in place and I wonder whether these are going to have to be reevaluated. 12/8; left fifth metatarsal head. Quite a bit better this week. Only a small open area remains. He sees Dr. Andree Guzman at [place] tomorrow. Dr. Andree Guzman is previously done his vascular interventions. He has 2 stents in the left leg as I remember things. I therefore will not put him in a total contact cast today. He will be using a forefoot off loader. We will use silver collagen on the wound. We will see him again next week 12/15; left fifth metatarsal head.  He saw Dr. Andree Guzman and is scheduled for an angiogram on Thursday. Unfortunately although his wound was a lot better last week it is really deteriorated this week. Small punched-out hole with depth and overhanging tissue. We have been using Hydrofera  Blue 12/22; patient had an 80% stenosis proximal to the stent I believe in the below-knee popliteal artery. This was opened by Dr. Andree Guzman. According the patient the stent itself was satisfactory. I have not been able to review this note. 12/29; patient arrives today with the wound about the same size some undermining medially. We have been using Hydrofera Blue under a total contact cast and that is what we will do again today 11/04/2019. Slightly smaller in orifice but about 4 mm undermining almost circumferentially. We have been using Hydrofera Blue. Apparently the Hydrofera Blue did not stay on the wound after we removed the cast. Some minor looking cast irritation on the right lateral 1/12; unfortunately things did not go well this week. Arrives in clinic today with a deeper wound with undermining more concerning a area of pus. Specimen was obtained for culture. We are not going to be able to put him in a cast today. 1/19; purulent material last week which I cultured showed Enterobacter cloacae. He was on ampicillin previously prescribed by his primary doctor which should not have covered this however mysteriously the wound looks somewhat better. There is less depth less erythema certainly no drainage. He will need a course of ciprofloxacin which I will provide today. 1/26; he has completed the ciprofloxacin. I don't think he needs any additional antibiotics. The areas on the left fifth plantar met head. We've been using silver alginate 2/2; comes in today with 0.4 cm of circumferential undermining around a small wound in this area. We have been using silver alginate with a forefoot offloading boot 2/9; again the wound appears larger nonviable surface and with 0.4 cm roughly of circumferential undermining. We have been using silver alginate with a forefoot offloading boot. The wound does not look infected however his wife is quick to point out about swelling on the dorsal foot. Electronic  Signature(s) Signed: 12/09/2019 5:08:08 PM By: Todd Ham MD Entered By: [name] on 12/09/2019 14:22:21 -------------------------------------------------------------------------------- Physical Exam Details Patient Name: Date of Service: [name], [name] 12/09/2019 12:30 PM Medical Record [id number] Patient Account Number: [id number] Date of Birth/Sex: Treating RN: September 21, 1946 (73 y.o. [name] Primary Care Provider: Rory Guzman Other Clinician: Referring Provider: Treating Provider/Extender:Kiril Hippe, [name], [name] in Treatment: 19 Constitutional Sitting or standing Blood Pressure is within target range for patient.. Pulse regular and within target range for patient.Marland Kitchen Respirations regular, non-labored and within target range.. Temperature is normal and within the target range for the patient.Marland Kitchen Appears in no distress. Cardiovascular Absent on the left. He has pitting edema on the top of his foot but this extends above the ankle I see no evidence of cellulitis or DVT. Notes Wound exam; no purulent drainage but undermining of 0.4 cm again circumferentially. Using a #3 pickup I remove this. There is bleeding the tissue however looks pale. There is no palpable bone. Electronic Signature(s) Signed: 12/09/2019 5:08:08 PM By: Todd Ham MD Entered By: [name] on 12/09/2019 14:25:37 -------------------------------------------------------------------------------- Physician Orders Details Patient Name: Date of Service: [name], [name] 12/09/2019 12:30 PM Medical Record [id number] Patient Account Number: [id number] Date of Birth/Sex: Treating RN: 1946/01/27 (73 y.o. [name] Primary Care Provider: Rory Guzman Other Clinician: Referring Provider: Treating Provider/Extender:Lynette Topete, [name], [name] in Treatment: [phone]  Verbal / Phone Orders: No Diagnosis Coding ICD-10 Coding Code Description E11.621 Type 2 diabetes mellitus  with foot ulcer E11.51 Type 2 diabetes mellitus with diabetic peripheral angiopathy without gangrene L97.521 Non-pressure chronic ulcer of other part of left foot limited to breakdown of skin L03.116 Cellulitis of left lower limb Follow-up Appointments Return Appointment in 1 week. Dressing Change Frequency Wound #5 Left Metatarsal head fifth Change Dressing every other day. Wound Cleansing Wound #5 Left Metatarsal head fifth May shower and wash wound with soap and water. Primary Wound Dressing Wound #5 Left Metatarsal head fifth Polymem Silver Other: - pad right great toe for protection Secondary Dressing Kerlix/Rolled Gauze Dry Gauze Off-Loading Other: - Fore foot off loader Radiology X-ray, foot left - non healing wound left 5th met head - (ICD10 E11.621 - Type 2 diabetes mellitus with foot ulcer) Electronic Signature(s) Signed: 12/09/2019 5:08:08 PM By: Todd Ham MD Signed: 12/09/2019 5:19:34 PM By: Todd Coria RN Entered By: [name] on 12/09/2019 13:39:20 -------------------------------------------------------------------------------- Prescription 12/09/2019 Patient Name: [name] Provider: [name] MD Date of Birth: Jul 17, 1946 NPI#: [id number] Sex: M DEA#: [id number] Phone #: [phone] License #: [id number] Patient Address: [address] [place], [place] [address] [place], [place] [address] [phone] Allergies daptomycin Reaction: pneumonia Severity: Severe Provider's Orders X-ray, foot left - ICD10: E11.621 - non healing wound left 5th met head Signature(s): Date(s): Electronic Signature(s) Signed: 12/09/2019 5:08:08 PM By: Todd Ham MD Signed: 12/09/2019 5:19:34 PM By: Todd Coria RN Entered By: [name] on 12/09/2019 13:39:20 --------------------------------------------------------------------------------  Problem List Details Patient Name: Date of  Service: [name], [name] 12/09/2019 12:30 PM Medical Record [id number] Patient Account Number: [id number] Date of Birth/Sex: Treating RN: 12-13-1945 (73 y.o. [name], Morey Hummingbird Primary Care Provider: Rory Guzman Other Clinician: Referring Provider: Treating Provider/Extender:Wealthy Danielski, [name], [name] in Treatment: 19 Active Problems ICD-10 Evaluated Encounter Code Description Active Date Today Diagnosis E11.621 Type 2 diabetes mellitus with foot ulcer 07/29/2019 No Yes E11.51 Type 2 diabetes mellitus with diabetic peripheral 07/29/2019 No Yes angiopathy without gangrene L97.521 Non-pressure chronic ulcer of other part of left foot 07/29/2019 No Yes limited to breakdown of skin L03.116 Cellulitis of left lower limb 08/12/2019 No Yes Inactive Problems Resolved Problems Electronic Signature(s) Signed: 12/09/2019 5:08:08 PM By: Todd Ham MD Entered By: [name] on 12/09/2019 14:20:56 -------------------------------------------------------------------------------- Progress Note Details Patient Name: Date of Service: ASAH, LAMAY 12/09/2019 12:30 PM Medical Record [id number] Patient Account Number: [id number] Date of Birth/Sex: Treating RN: 07/20/1946 (73 y.o. [name] Primary Care Provider: Rory Guzman Other Clinician: Referring Provider: Treating Provider/Extender:Chantay Whitelock, [name], [name] in Treatment: 19 Subjective History of Present Illness (HPI) 05/18/16; this is a 74year-old diabetic who is a type II diabetic on insulin. The history is that he traumatized his right foot developed a sore sometime in late March. Shortly thereafter he went on a cruise but he had to get off the cruise ship in [place] and fly urgently back to [place] where he was admitted to [facility] and ultimately underwent a transmetatarsal amputation by Dr. 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 According to the patient and his wife this wound  never really healed. He was seen on 2 occasions in the wound care center in [place] and had vascular studies and then was referred urgently to Dr. Bridgett Guzman of vascular surgery. He underwent an angiogram on 05/10/16. Unfortunately nothing really could be done to improve his vascular status.  He had a 75-90% stenosis in the midsegment of 1 segment of the posterior femoral artery. He had a patent popliteal, his anterior tibial occluded shortly after takeoff. Perineal had a greater than 90% stenosis posterior tibial is occluded feet had no distal collaterals feed distal aspect of the transmetatarsal amputation site. The patient tells me that he had a prolonged period of Santyl by Dr. Doran Guzman was some initial improvement but then this was stopped. I think they're only applying daily dressings/dry dressings. He has not had a recent x-ray of the right foot he did have one before his surgery in April. His wife by the dimensions of the wound/surgical site being followed at home since 4/20. At that point the dimensions were 0.5 x 12 x 0.2 on 7/19 this was 1.8 x 6 x 0.4. He is not currently on any antibiotics. His hemoglobin A1c in early April was 12.9 at that point he was started on insulin. Apparently his blood sugars are much lower he has an appointment with Dr. Legrand Como Guzman of endocrine next week. 05/29/16 x-ray of the area did not show osteomyelitis. I think he probably needs an MRI at this point. His wife is asking about something called"Yireh" cream which is not FDA approved. I have not heard of this. 06/22/16; MRI did not really suggest osteomyelitis. There was minimal marrow edema and enhancement in the stump of the second metatarsal felt to be secondary likely to postoperative change rather than osteomyelitis. The patient has arranged his own consultation with Dr. Andree Guzman at [place], apparently their daughter lives in [place] and has some connection here. Any improvement in vascular supply by Dr. Andree Guzman would of  course be helpful. Dr. Bridgett Guzman did not feel that anything further could be done other than amputation if wound care did not result in healing or if the area deteriorates. 06/26/16; the patient has been to see Dr. Andree Guzman at [facility] and had an angiogram. He is going for a procedure on Thursday which will involve catheterization. I'm not sure if this is an anterograde or retrograde approach. He has been using Santyl to the wound 07/10/16; the patient had a repeat angiogram and angioplasty at [place] by Dr. Brunetta Guzman. His angiogram showed right CFA and profundal widely patent. The right as of a.m. popliteal artery were widely patent the right anterior tibial was occluded proximally and reconstitutes at the ankle. Peroneal artery was patent to the foot. Posterior tibial artery was occluded. The patient had angioplasty of the anterior tibial artery.. This was quite successful. He was recommended for Plavix as well as aspirin. 07/17/16; the patient was close to be a nurse visit today however the outer dressing of the Apligraf fell off. Noted drainage. I was asked to see the wound. The patient is noted an odor however his wife had noted that. Drainage with Apligraf not necessarily a bad thing. He has not been systemically unwell 07/24/16; we are still have an issue with drainage of this wound. In spite of this I applied his second Apligraf. Medially the area still is probing to bone. 08/07/16; Apligraf reapplied in general wound looks improved. 08/21/16 Apligraf #4. Wound looks much better 09/04/16 patientt's wound again today continues to appear to improve with the application of the Apligraf's. He notes no increased discomfort or concerns at this point in time. 09/18/16; the patient returns today 2 weeks after his fifth application of Apligraf. Predictably three quarters of the width of this wound has healed. The deep area that probe to bone medially  is still open. The patient asked how much out-of-pocket dollars  would be for additional Apligraf's. 09/25/16; now using Hydrofera Blue. He has completed 5 Apligraf applications with considerable improvement in this deep open transmetatarsal amputation site. His wound is now a triangular-shaped wound on the medial aspect. At roughly 12 to 2:00 this probes another centimeter but as opposed to in the past this does not probe to bone. The patient has been seen at [place] by Dr. Zenia Resides. He is not planning to do any more revascularization unless the wound stalls or worsens per the patient 10/02/16; 0.7 x 0.8 x 0.8. Unfortunately although the wound looks stable to improved. There is now easily probable bone. This hasn't been present for several weeks. Patient is not otherwise symptomatic he is not experiencing any pain. I did a culture of the wound bed 10/09/16. Deterioration last week. Culture grew MRSA and although there is improvement here with doxycycline prescribed over the phone I'm going to try to get him linezolid 600 twice a day for 10 days today. 10/16/16; he is completing a weeks worth of linezolid and still has 3 more days to go. Small triangular-shaped open area with some degree of undermining. There is still palpable bone with a curet. Overall the area appears better than last week 10/20/16 he has completed the linezolid still having some nausea and vomiting but no diarrhea. He has exposed bone this week which is a deterioration. 10/27/16 patient now has a small but probing wound down to bone. Culture of this bone that I did last week showed a few methicillin-resistant staph aureus. I have little doubt that this represents acute/subacute osteomyelitis. The patient is currently on Doxy which I will continue he also completed 10 days of linezolid. We are now in a difficult situation with this patient's foot after considerable discussion we will send him back to see Dr. Doran Guzman for a surgical opinion of this I'm also going to try to arrange a infectious disease  consult at [facility] hopefully week and get this prior to her usual 4-6 weeks we having [place]. The patient clearly is going to need 6 weeks of IV vancomycin. If we cannot arrange this expediently I'll have to consider ordering this myself through a home infusion company 11/03/16; the patient now has a small in terms of circumference but probing wound. No bone palpable today. The patient remains on doxycycline 100 twice a day which should support him until he sees infectious disease at [facility] next week the following week on Wednesday I believe he has an appointment with Dr. Andree Guzman at [facility] who is his vascular cardiologist. Finally he has an appointment with Dr. Doran Guzman on 11/22/16 we have been using silver alginate. The patient's wife states they are having trouble getting this through [facility] 11/13/16; the patient was seen by infectious disease at [facility] in the 11th PICC line placed in preparation for IV antibiotics. A tummy he has not going to get IV vancomycin o Ceftaroline. They've also ordered an MRI. Patient has a follow-up with Dr. Doran Guzman on 11/22/16 and Dr. Andree Guzman at [facility] tomorrow 11/23/16 the patient is on daptomycin as directed by infectious disease at [facility]. He is also been back to see Dr. Andree Guzman at [facility]. He underwent a repeat arteriogram. He had a successful PTA of the right anterior tibial artery. He is on dual antiplatelete treatment with Plavix and aspirin. Finally he had the MRI of his foot in [place]. This showed cellulitis about the foot worse distally edema  and enhancement in the reminiscent of the second metatarsal was consistent with osteomyelitis therefore what I was assuming to be the first metatarsal may be actually the second. He also has a fluid collection deep to the calcaneus at the level of the calcaneal spur which could be an abscess or due to adventitial bursitis. He had a small tear in his Achilles 11/30/16; the patient continues on daptomycin as  directed by infectious disease at [facility]. He is been revascularized by Dr. Andree Guzman at [place] in [place]. He has been to see Gretta Arab who was the orthopedic surgeon who did his original amputation. I have not seen his not however per the patient's wife he did not offer another surgical local surgical prodecure to remove involved bone. He verbalized his usual disbelief in not just hyperbarics but any medical therapy for this condition(osteomyelitis). I discussed this in detail with the patient today including answering the question about a BKA definitively "curing" the current condition. 12/07/16; the patient continues on daptomycin as directed by infectious disease at [facility]. This is directed at the MRSA that we cultured from his bone debridement from 12/22. Lab work today shows a white count of 8.7 hemoglobin of 10.5 which is microcytic and hypochromic differential count shows a slightly elevated monocyte count at 1.2 eosinophilic count of 0.6. His creatinine is 1.11 sedimentation rate apparently is gone from 35-34 now 40. I explained was wife I don't think this represents a trend. His total CK is 48 12/14/16- patient is here for follow-up evaluation of his right TMA site. He continues to receive IV daptomycin per infectious disease. His serum inflammatory markers remain elevated. He complains of intermittent pain to the medial aspect of the TMA site with intermittent erythema. He voices no complaints or concerns regarding hyperbaric therapy. Overall he and his wife are expressing a frustration and discouragement regarrding the length of time of treatment. 12/21/16; small open wound at roughly the first or second metatarsal metatarsalphalyngeal joint reminiscence of his transmetatarsal amputation site he continues to receive IV daptomycin per infectious disease at [facility]. He will finish these a week tomorrow. He has lab work which I been copied on. His white count is 10.8 hemoglobin 8.9 MCV  is low at 75., MCH low at 24.5 platelet count slightly elevated at 626. Differential count shows 70% neutrophils 10% monocytes and 10% eosinophils. His comprehensive metabolic panel shows a slightly low sodium at 133 albumin low at 3.1 total CK is normal at 42 sedimentation rate is much higher at 82. He has had iron studies that show a serum iron of 15 and iron binding capacity of 238 and iron saturation of 6. This is suggestive of iron deficiency. B12 and folate were normal ferritin at 113 The patient tells me that he is not eating well and he has lost weight. He feels episodically nauseated. He is coughing and gagging on mucus which she thinks is sinusitis. He has an appointment with his primary doctor at 5:00 this afternoon in [facility] 01/02/17; the patient developed a subacute pneumonitis. He was admitted to [facility] after a CT scan showed an extensive interstitial pneumonitis [I have not yet seen this]. 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 He has since been discharged. He is not on oxygen. He feels fatigued and very short of breath with exertion. At [facility] the wound care nurse there felt that his wound was healed. He did complete  his daptomycin and has follow-up with infectious disease on Friday. X-rays I did before he went to [facility] still suggested residual osteomyelitis in the anterior aspect of the second must metatarsal head. I'm not sure I would've expected any different. There was no other findings. I actually think I did this because of erythema over the first metatarsal head reminiscent 01/11/17; the patient has eosinophilic pneumonitis. He has been reviewed by pulmonology and given clearance for hyperbarics at least that's what his wife says. I'll need to see if there is note in care everywhere. Apparently the prognosis for improvement of daptomycin induced  eosinophilic granulocyte is is 3 months without steroids. In the meantime infectious disease has placed him on doxycycline until the wound is closed. He is still is lost a lot of weight and his blood sugars are running in the mid 60s to low 80s fasting and at all times during the day 01/18/17; he has had adjustments in his insulin apparently his blood sugars in the morning or over 100. He wants to restart his hyperbaric treatment we'll do this at 1:00. 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 I think there is good reason to complete his treatments in order to give him the best chance of maintaining a healed status and these DFU 3 wounds with MRSA infection in the bone 02/15/17; the patient was seen today in conjunction with HBO. He completed hyperbaric oxygen today. The open area on his transmetatarsal site has remained closed. There was an area of erythema when I saw him earlier in the week on the posterior heel although that is resolved as of today as well. He has been using a cam walker. This is a patient who came to [place] after a transmetatarsal amputation that was necrotic and dehisced. He required revascularization percutaneously on 2 different occasions by Dr. Andree Guzman of invasive cardiology at [facility]. 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 He went to [facility] infectious disease and had IV daptomycin for 5 weeks before developing eosinophilic pneumonitis and requiring an admission to hospital/ICU. He made a good recovery and is continued on doxycycline since. As mentioned his foot is closed now. He has a follow-up with Dr. Andree Guzman tomorrow. He is going to [employer] on Monday for a custom-made shoe READMISSION Last visit Dr. Andree Guzman [facility] 02/16/17 1. Critical limb ischemia of the RLE:  s/p transmetatarsal  amputation of the RLE, now completely healed.  s/p right ATA percutaneous revascularization procedure x2, most recently 11/20/2016 s/p PTA of right AT 100% to less than 20% with a 2.5 x 200 balloon.  He does have some residual osteomyelitis, but given the wound is closed, the orthopedist has recommended to follow. He has a fitting for a special shoe coming up next week. He has completed a course of daptomycin and doxycycline and he has been discharged by ID. He has completed a course of hyperbaric therapy.  Continue Continue medical management with aspirin, Plavix and statin therapy. We will discuss ongoing Plavix therapy at follow-up in 6 months.  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 Given no symptoms, we will conservatively manage. He doesn't want to do any more invasive studies at this time, which is reasonable. The patient arrives today out of 2  concerns both on the right transmetatarsal site. 1 at the level of the reminiscent fifth metatarsal head and the other at roughly the first or second. Both of these look like dark subcutaneous discoloration probably subdermal bleeding. He is recently obtained new adaptive footwear for the right foot. He also has a callus on the left fifth dorsal toe however he follows with podiatry for this and I don't think this is any issue. ABIs in this clinic today were 0.66 on the right and 1.06 on the left. On entrance into our clinic initially this was 0.95 and 0.92. He does not describe current claudication. They're going away on a cruise in 8 weeks and I think are trying to do the month is much as they can proactively. They follow with podiatry and have an appointment with Dr. Andree Guzman in October READMISSION 06/25/18 This is a patient that we have not seen in almost a year. He is a type II diabetic with known PAD. He is  followed by Dr. Andree Guzman of interventional cardiology at [facility] in [place]. He is required revascularization for significant PAD. When we first saw him he required a transmetatarsal amputation. He had underlying osteomyelitis with a nonhealing surgical wound. This eventually closed with wound care, IV antibiotics and hyperbaric oxygen. They tell me that he has a modified shoe and he is very active walking up to 4 miles a day. He is followed by Dr. Geroge Baseman of podiatry. His wife states that he underwent a removal of callus over this site on July 9. She felt there may be drainage from this site after that although she could never really determined and there was callus buildup again. On 06/01/18 there was pressure bleeding through the overlying callus. he was given a prescription for 7 days of Bactrim. Fortuitously he has an appointment with Dr. Andree Guzman on 06/04/18 and he immediately underwent revascularization of the right leg although I have not had a chance to review these records in care everywhere. This was apparently done through anterior and retrograde access. On 06/06/18 he had another debridement by Dr. Bernette Mayers. Vitamin soaking with Epsom salts for 20 minutes and applying calcium alginate. The original trans-met was on April 2017 I believe by Dr. Doran Guzman. His ABI in our clinic was noncompressible today. 07/02/18; x-ray I ordered last week was negative for osteomyelitis. Swab culture was also negative. He is going to require an MRI which I have ordered today. 07/09/18; surprisingly the MRI of the foot that I ordered did not show osteomyelitis. He did suggest the possibility of cellulitis. For this reason I'll go ahead and give him a 10 day course of doxycycline. Although the previous culture of this area was negative 07/16/18; it arrives with the wound looking much the same. Roughly the same depth. He has thick subcutaneous tissue around the wound orifice but this still has roughly the same depth. Been  using silver alginate. We applied Oasis #1 today 07/23/2018; still having to remove a lot of callus and thick subcutaneous tissue to actually define the wound here. Most of this seems to have closed down yet he has a comma shaped divot over the top of the area that still I think is open. We applied Oasis #2 His wife expressed concern about the tip of his left great toe. This almost looks like a small blister. She also showed it today to her podiatrist Dr. Geroge Baseman who did not think this was anything serious. I am not sure is anything serious either however I  think it bears some watching. He is not in any pain however he is insensate 07/30/2018;; still on a lot of nonviable tissue over the surface of the wound however cleaning this up reveals a more substantial wound orifice but less of a probing wound depth. This is not probed to bone. There is no evidence of infection The wife is still concerned about a non-open area on the tip of his left great toe. Almost feels like a bony outgrowth. She had previously showed this to podiatry. I do not think this is a blister. A friction area would be possible although he is really not walking according to his wife. He had a small skin tag on his right buttock but no open wound here either 08/06/2018; we applied a third Oasis last week. Unfortunately there is really no improvement. Still requiring extensive debridement to expose the wound bed from a horizontal slitlike depression. I still have not been able to get this to fill in properly. On the positive side there is now no probable bone from when he first came into the facility. I changed him to silver alginate today after a reasonably aggressive debridement 08/13/2018; once again the patient comes in with skin and subcutaneous tissue closing over the small probing area with the underlying cavity of the wound on the right TMA site. I applied silver alginate to this last week. Prior to that we used Oasis x3  still not able to get this area granulating. He does not have a probing area of the bone which is an improvement from when I spur started working on this and an MRI did not suggest osteomyelitis. I do not see evidence of infection here but I am increasingly concerned about why I cannot get this area to granulate. Each time I debrided this is looks like this is simply a matter of getting granulation to fill in the hole and then getting epithelialization. This does not seem to happen Also I sent him back to see podiatry Dr. Earleen Newport about the what felt to be bony outgrowth on the tip of his left great toe. Apparently after heel he left the clinic last week or the next day he developed a blood blister. He did see Dr. Earleen Newport. He went on to have a debridement of the medial nail cuticle he now has an open area here as well as some denuded skin. They did an x-ray apparently does have a bony outgrowth or spur but I am not able to look at this. They are using topical antibiotics apparently there was some suggested he use Santyl. Patient's wife was anxious for my opinion of this 08/20/2018; we are able to keep the wound open this time instead of the thick subcutaneous tissue closing over the top of it however unfortunately once again this probes to bone. I do not see any evidence of infection and previous MRI did not show osteomyelitis. I elected to go back to the Oasis to see if we can stimulate some granulation. Last saw his vascular interventional cardiologist Dr. Andree Guzman at the beginning of August and he had a repeat procedure. Nevertheless I wonder how much blood flow he has down to this area With regards to the left first toe he is seeing Dr. Earleen Newport next week. They are applying Bactroban to this area. 08/27/2018 ooOnce again he comes in with thick eschar and subcutaneous tissue over the top of the small probing hole. This does not appear to go down to bone but it still has  roughly the same depth. I  reapplied Oasis today ooOver the left great toe there appears to be more of the wound at the tip of his toe than there was last week. This has an eschar on the surface of it. Will change to Santyl. There is seeing podiatry this afternoon 09/03/2018 ooHe comes in today with the area on the transmetatarsal's site with a fair amount of callus, nonviable tissue over the circumference but it was not closed. I removed all of this as well as some subcutaneous debris and reapplied Oasis. There is no exposed bone ooThe area over the tip of the left great toe started off as a nodule of uncertain etiology. He has been followed with podiatry. They have been removing part of the medial nail bed. He has nonviable tissue over the wound he been using Santyl in this area 09/10/18 ooUnfortunately comes in with neither wound area looking improved. The transmetatarsal amputation site once Again has nonviable debris over the surface requiring debridement. Unfortunately underneath this there is nothing that looks viable and this once again goes right down to bone. There is no purulent drainage and no erythema. ooAlso the surgical wound from podiatry on the left first toe has an ischemic-looking eschar over the surface of the tip of the toe I have elected not to attempt candidly debride this ooHis wife as arranged for him to have follow-up noninvasive studies in [place] under the care of Dr. Andree Guzman clinic. The question is he has known severe PAD. They had recently seen him in August. He has had revascularizations in both legs within the last 4 or 5 months. He is not complaining of pain and I cannot really get a history of claudication. He has not systemically unwell 09/20/2018 the patient has been to [facility] and been revascularized by Dr. Andree Guzman earlier this week. Apparently he was able to open up the anterior tibial artery although I have not actually seen his formal report. He is going for an attempt to  revascularize on the left on Monday. He is apparently working with an investigational stent for lower extremity arteries below the knee and he has talked to the patient about placing that on Monday if possible. The patient has been using silver alginate on the transmetatarsal amputation site and Santyl on the left 09/30/2018; patient had his revascularization on the left this apparently included a standard approach as well as a more distal arterial catheterization although I do not have any information on this from Dr. Andree Guzman. In fact I do not even see the initial revascularization that he had on the right. I have included the arterial history from Dr. Andree Guzman last note however below; ASSESSMENT/PLAN: 1. Hx of Critical limb ischemia bilateral lower extremities, PAD: -s/p transmetatarsal amputation of the RLE. -s/p right ATA percutaneous revascularization procedure x2, most recently 11/20/2016 s/p PTA of right AT 100% to less than 20% with a 2.5 x 200 balloon. 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 -03/21/2018 s/p PTA of 90% left popliteal artery to <10% with a 5x20 cutting balloon, PTA of 90% left peroneal to <20% with a 3x20 balloon, PTA of 100% left AT to <20% with a 2.5x220 balloon. 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 Will plan  on the LLE soon after. Risks/benefits of procedure have been discussed and patient has elected to proceed. We have been using endoform to the right TMA amputation site wound and Santyl to the left great toe 10/07/2018; the area on the tip of his left great toe looked better we have been using Santyl here. We continue to have a very difficult probing hole on the right TMA amputation site we  have been using endoform. I went on to use his fifth Oasis today 10/14/2018; the tip of the left great toe continues to look better. We have been using Santyl here the surface however is healthy and I think we can change to an alginate. The right TMA has not changed. Once again he has no superficial opening there is callus and thick subcutaneous tissue over the orifice once you remove this there is the probing area that we have been dealing with without too much change. There is no palpable bone I have been placing Oasis here and put Oasis #6 in this today after a more vigorous debridement 10/21/18; the left great toe still has necrotic surface requiring debridement. The right TMA site hasn't changed in view of the thick callus over the wound bed. With removal of this there is still the opening however this does not appear to have the same depth. Again there is no palpable bone. Oasis was replaced 10/28/2018; patient comes in with both wounds looking worse. The area over the first toe tip is now down to bone. The area over the TMA site is deeper and down to bone clearly with a increase in overall wound area. Equally concerning on the right TMA is the complete absence of a pulse this week which is a change. We are not even able to Doppler this. On the right he has a noncompressible ABI greater than 1.4 11/04/2018. Both wounds look somewhat worse. X-rays showed no osteomyelitis of the right foot but on the left there was underlying osteomyelitis in the left great toe distal phalanx. I been on the phone to Dr. Andree Guzman surface at [facility] in [place] and they are arranging for another angiogram on the right on Monday. I have him on doxycycline for the osteomyelitis in the left great toe for now. Infectious disease may be necessary 1/17; 2-week hiatus. Patient was admitted to hospital at [place]. My understanding is he underwent an angioplasty of the right anterior tibial artery and had stents placed in the  left anterior artery and the left tibial peroneal trunk. This was done by Dr. Andree Guzman of interventional radiology. There is no major change in either 1 of the wounds. They have been using Aquacel Ag. As far as they are aware no imaging studies were done of the foot which is indeed unfortunate. I had him on doxycycline for 2 weeks since we identified the osteomyelitis in the left great toe by plain x-ray. I have renewed that again today. I am still suspicious about osteomyelitis in the amputation site and would consider doing another MRI to compare with the one done in September. The idea of hyperbaric oxygen certainly comes up for discussion 1/24; no major change in either wound area. I have him on doxycycline for osteomyelitis at the tip of the left great toe. As noted he has been previously and recently revascularized by Dr. Andree Guzman at [place]. He is tolerating the doxycycline well. For some reason we do not have an infectious disease consult yet. Culture of drainage from the right foot site last week  was negative 1/31; MRI of the right foot did not show osteomyelitis of the right ankle and foot. Notable for a skin ulceration overlying the second metatarsal stump with generalizing soft tissue edema of the ankle and foot consistent with cellulitis. Noted to have a partial-thickness tear of the Achilles tendon 7.5 cm proximal to the insertion Nothing really new in terms of symptoms. Patient's area on the tip of the left great toe is just about closed although he still has the probing area on the metatarsal amputation site. Appointment with Dr. Linus Salmons of infectious disease next week 2/7; Dr. Novella Olive did not feel that any further antibiotics were necessary he would follow-up in 2 months. The left great toe appears to be closed still some surface callus that I gently looked under high did not see anything open or anything that was threatening to be open. He still has the open area on the mid part of his TMA  site using endoform 2/14; left great toe is closed and he is completing his doxycycline as of last Sunday. He is not on any antibiotics. Unfortunately out of the right foot his wife noticed some subdermal hemorrhage this week. He had been walking 2 miles I had given him permission to do so this is not really a plantar wound. Using endoform to this wound but I changed to silver alginate this week 2/21; left great toe remains closed. Culture last week grew Streptococcus angiosis which I am not really familiar with however it is penicillin sensitive and I am going to put him on Augmentin. Not much change in the wound on the right foot the deep area is still probing precariously close to bone and the wound on the margin of the TMA is larger. 2/28; left great toe remains closed. He is completing the Augmentin I gave him last week. 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 This is being shown to Dr. Andree Guzman at [place] to see if there is anything else that can be done here. He has been using silver alginate strips on the right 3/6; left great toe remains closed. He sees Dr. Andree Guzman on Monday. The area on the plantar aspect of the right foot has the same small orifice with thick callused tissue around this. However this time with removal of the callus tissue the wound is open all the way along the incision line to the end medially. We have been using silver alginate. 3/13; left toe remains closed although the area is callused. He sees Dr. Jacqualyn Posey of podiatry next week. The area on the plantar right foot looked a lot better this week. Culture I did of this was negative we use silver alginate. He is going next week for an attempt at revascularization by Dr. Andree Guzman 3/23; left toe remains closed although the area is callused. He will see Dr. Jacqualyn Posey in follow-up. The area on the right plantar foot continues to look surprisingly better over the last 3 visits. We have been using  silver alginate. The revascularization he was supposed to have by Dr. Andree Guzman at [facility] in [place] has been canceled [facility] procedure] 4/6; the right foot remains closed albeit callused. Podiatry canceled the appointment with regards to the left great toe. He has not seen Dr. Andree Guzman at [facility] but thinks that Dr. Andree Guzman has "done all he can do". He would be a candidate for the hemostaemix trial Readmission 07/29/2019 Mr. [name] is a man we know well from at least 3 previous  stays in this clinic. He is a type II diabetic with severe PAD followed by Dr. Andree Guzman at [facility] in [place]. 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 So far everything in both of these areas has remained closed. About 2 weeks ago he went to see his podiatrist at [facility] Dr. Babs Bertin. He had a thick callus on the left fifth metatarsal head that was shaved. He has developed an open wound in this area. They have been offloading this in his diabetic shoes. The patient has not had any more revascularizations by Dr. Andree Guzman since the last time he was here. ABI in our clinic at the posterior tibial on the left was 1.06 10/6; no real change in the area on the plantar met head. Small wound with 2 mm of depth. He has thick skin probably from pressure around the wound. We have been using silver alginate 10/13; small wound in the left fifth plantar met head. Arrives today with undermining laterally and purulent drainage. Our intake nurse cultured this. His wife stated they noticed a change in color over the last day or 2. He is not systemically unwell 10/19; small wound on the fifth plantar metatarsal head. Culture I did last week showed Staphylococcus lugdunensis. Although this could be a skin contaminant the possibility of a skin and soft tissue infection was there. I did give him empiric  doxycycline which should have covered this. We are using silver alginate to the wound. We put him in a total contact cast today. 10/22; small wound on the fifth plantar metatarsal head. He has completed antibiotics. He also has severe PAD which worries me about just about any wound on this man's foot 10/29; small superficial area on the fifth plantar metatarsal head. Measuring slightly smaller. He is not currently on any antibiotics. I been using a total contact cast in this man with very severe PAD but he seems to be tolerating this well 11/5; left plantar fifth metatarsal head. Silver alginate being used under a total contact cast 11/12; wound not much different than last week. Using a #15 scalpel debridement around the wound. Still silver collagen under a total contact cast. He has severe PAD and that may be playing a role in this 11/19; disappointing that the wound is not really changed that much. I think it is come down in overall surface area because originally this was on the lateral part of the fifth metatarsal head however recently it is not really changed. Most of this is filled in but it will not epithelialized there is surface debris on this which may be mostly related to ischemia. They have an appointment with Dr. Andree Guzman on 12/9 09/24/2019 on evaluation today patient appears to be doing somewhat better with regard to the wound on the left fifth metatarsal head. Fortunately there does not appear to be any signs of active infection at this time. No fevers, chills, nausea, vomiting, or diarrhea. The wound does not appear to be completely closed and I do feel like the Hydrofera Blue is helping to some degree although I feel like the cast as well as keeping things from breaking down or getting any larger at least. As far as his wound overview it shows that the overall size of the wound is slightly smaller but really maintaining within the realm of about the same. He  had no troubles with the  cast which is good news. 12/1; left fifth metatarsal head. Perhaps somewhat more vibrant. We have been using Hydrofera Blue. He sees Dr. Andree Guzman at [place] 1 week tomorrow. He be back next week and I hope to have a better idea which way the wound is going however I will not cast him next week in case Dr. Andree Guzman wants to reevaluate things in his foot. The left leg is the leg with the stent in place and I wonder whether these are going to have to be reevaluated. 12/8; left fifth metatarsal head. Quite a bit better this week. Only a small open area remains. He sees Dr. Andree Guzman at [place] tomorrow. Dr. Andree Guzman is previously done his vascular interventions. He has 2 stents in the left leg as I remember things. I therefore will not put him in a total contact cast today. He will be using a forefoot off loader. We will use silver collagen on the wound. We will see him again next week 12/15; left fifth metatarsal head. He saw Dr. Andree Guzman and is scheduled for an angiogram on Thursday. Unfortunately although his wound was a lot better last week it is really deteriorated this week. Small punched-out hole with depth and overhanging tissue. We have been using Hydrofera Blue 12/22; patient had an 80% stenosis proximal to the stent I believe in the below-knee popliteal artery. This was opened by Dr. Andree Guzman. According the patient the stent itself was satisfactory. I have not been able to review this note. 12/29; patient arrives today with the wound about the same size some undermining medially. We have been using Hydrofera Blue under a total contact cast and that is what we will do again today 11/04/2019. Slightly smaller in orifice but about 4 mm undermining almost circumferentially. We have been using Hydrofera Blue. Apparently the Hydrofera Blue did not stay on the wound after we removed the cast. Some minor looking cast irritation on the right lateral 1/12; unfortunately things did not go well this week. Arrives in clinic today  with a deeper wound with undermining more concerning a area of pus. Specimen was obtained for culture. We are not going to be able to put him in a cast today. 1/19; purulent material last week which I cultured showed Enterobacter cloacae. He was on ampicillin previously prescribed by his primary doctor which should not have covered this however mysteriously the wound looks somewhat better. There is less depth less erythema certainly no drainage. He will need a course of ciprofloxacin which I will provide today. 1/26; he has completed the ciprofloxacin. I don't think he needs any additional antibiotics. The areas on the left fifth plantar met head. We've been using silver alginate 2/2; comes in today with 0.4 cm of circumferential undermining around a small wound in this area. We have been using silver alginate with a forefoot offloading boot 2/9; again the wound appears larger nonviable surface and with 0.4 cm roughly of circumferential undermining. We have been using silver alginate with a forefoot offloading boot. The wound does not look infected however his wife is quick to point out about swelling on the dorsal foot. Objective Constitutional Sitting or standing Blood Pressure is within target range for patient.. Pulse regular and within target range for patient.Marland Kitchen Respirations regular, non-labored and within target range.. Temperature is normal and within the target range for the patient.Marland Kitchen Appears in no distress. Vitals Time Taken: 12:53 PM, Height: 69 in, Weight: 210 lbs, BMI: 31, Temperature: 98.2 F,  Pulse: 75 bpm, Respiratory Rate: 18 breaths/min, Blood Pressure: 106/53 mmHg, Capillary Blood Glucose: 116 mg/dl. General Notes: glucose per pt report Cardiovascular Absent on the left. He has pitting edema on the top of his foot but this extends above the ankle I see no evidence of cellulitis or DVT. General Notes: Wound exam; no purulent drainage but undermining of 0.4 cm again  circumferentially. Using a #3 pickup I remove this. There is bleeding the tissue however looks pale. There is no palpable bone. Integumentary (Hair, Skin) Wound #5 status is Open. Original cause of wound was Gradually Appeared. The wound is located on the Left Metatarsal head fifth. The wound measures 0.7cm length x 0.7cm width x 0.2cm depth; 0.385cm^2 area and 0.077cm^3 volume. There is Fat Layer (Subcutaneous Tissue) Exposed exposed. There is no tunneling noted, however, there is undermining starting at 12:00 and ending at 12:00 with a maximum distance of 0.6cm. There is a small amount of serosanguineous drainage noted. The wound margin is well defined and not attached to the wound base. There is medium (34-66%) pink, pale, friable granulation within the wound bed. There is a medium (34-66%) amount of necrotic tissue within the wound bed including Adherent Slough. Assessment Active Problems ICD-10 Type 2 diabetes mellitus with foot ulcer Type 2 diabetes mellitus with diabetic peripheral angiopathy without gangrene Non-pressure chronic ulcer of other part of left foot limited to breakdown of skin Cellulitis of left lower limb Procedures Wound #5 Pre-procedure diagnosis of Wound #5 is a Diabetic Wound/Ulcer of the Lower Extremity located on the Left Metatarsal head fifth .Severity of Tissue Pre Debridement is: Fat layer exposed. There was a Excisional Skin/Subcutaneous Tissue Debridement with a total area of 0.49 sq cm performed by [name]., MD. With the following instrument(s): Curette to remove Viable and Non-Viable tissue/material. Material removed includes Callus, Subcutaneous Tissue, Skin: Dermis, and Skin: Epidermis after achieving pain control using Other (benzocaine 20%). No specimens were taken. A time out was conducted at 13:30, prior to the start of the procedure. A Minimum amount of bleeding was controlled with Pressure. The procedure was tolerated well with a pain  level of 0 throughout and a pain level of 0 following the procedure. Post Debridement Measurements: 0.7cm length x 0.7cm width x 0.2cm depth; 0.077cm^3 volume. Character of Wound/Ulcer Post Debridement is improved. Severity of Tissue Post Debridement is: Fat layer exposed. Post procedure Diagnosis Wound #5: Same as Pre-Procedure Plan Follow-up Appointments: Return Appointment in 1 week. Dressing Change Frequency: Wound #5 Left Metatarsal head fifth: Change Dressing every other day. Wound Cleansing: Wound #5 Left Metatarsal head fifth: May shower and wash wound with soap and water. Primary Wound Dressing: Wound #5 Left Metatarsal head fifth: Polymem Silver Other: - pad right great toe for protection Secondary Dressing: Kerlix/Rolled Gauze Dry Gauze Off-Loading: Other: - Fore foot off loader Radiology ordered were: X-ray, foot left - non healing wound left 5th met head 1. I have the primary dressing.polymen silver change every second day 2. I have elected not to put the patient in a total contact cast 3. He is following with vein and vascular at [place] with Dr. Andree Guzman apparently scheduled for an ultrasound of the arteries below the stents Electronic Signature(s) Signed: 12/09/2019 5:08:08 PM By: Todd Ham MD Entered By: [name] on 12/09/2019 14:26:48 -------------------------------------------------------------------------------- [place] Details Patient Name: Date of Service: SRIJAN, GIVAN 12/09/2019 Medical Record [id number] Patient Account Number: [id number] Date of Birth/Sex: Treating RN: 1946-05-16 (73 y.o. [name] Primary Care Provider: Nadara Mustard,  Lennette Bihari Other Clinician: Referring Provider: Treating Provider/Extender:Belinda Schlichting, [name], [name] in Treatment: 19 Diagnosis Coding ICD-10 Codes Code Description E11.621 Type 2 diabetes mellitus with foot ulcer E11.51 Type 2 diabetes mellitus with diabetic peripheral angiopathy without  gangrene L97.521 Non-pressure chronic ulcer of other part of left foot limited to breakdown of skin L03.116 Cellulitis of left lower limb Facility Procedures CPT4 Code Description: [id number] 11042 - DEB SUBQ TISSUE 20 SQ CM/< ICD-10 Diagnosis Description L97.521 Non-pressure chronic ulcer of other part of left foot limite Modifier: d to breakdo Quantity: 1 wn of skin Physician Procedures CPT4 Code Description: [id number] [id number] - WC PHYS SUBQ TISS 20 SQ CM ICD-10 Diagnosis Description L97.521 Non-pressure chronic ulcer of other part of left foot limite Modifier: d to breakdo Quantity: 1 wn of skin Electronic Signature(s) Signed: 12/09/2019 5:08:08 PM By: Todd Ham MD Entered By: [name] on 12/09/2019 14:27:08

## 2019-12-16 ENCOUNTER — Encounter (HOSPITAL_BASED_OUTPATIENT_CLINIC_OR_DEPARTMENT_OTHER): Payer: Medicare Other | Admitting: Internal Medicine

## 2019-12-16 ENCOUNTER — Other Ambulatory Visit: Payer: Self-pay

## 2019-12-16 DIAGNOSIS — E11621 Type 2 diabetes mellitus with foot ulcer: Secondary | ICD-10-CM | POA: Diagnosis not present

## 2019-12-17 NOTE — Progress Notes (Signed)
Todd, Guzman ([id number]) Visit Report for 12/16/2019 Debridement Details Patient Name: Date of Service: [name], [name] 12/16/2019 12:30 PM Medical Record [id number] Patient Account Number: [id number] Date of Birth/Sex: 01-15-1946 (74 y.o. M) Treating RN: [name] Primary Care Provider: Rory Guzman Other Clinician: Referring Provider: Treating Provider/Extender:[name], [name], [name] in Treatment: 20 Debridement Performed for Wound #5 Left Metatarsal head fifth Assessment: Performed By: Physician Ricard Dillon., MD Debridement Type: Debridement Severity of Tissue Pre Fat layer exposed Debridement: Level of Consciousness (Pre- Awake and Alert procedure): Pre-procedure Verification/Time Out Taken: Yes - 13:14 Start Time: 13:14 Pain Control: Other : benzocaine 20% Total Area Debrided (L x W): 0.5 (cm) x 0.6 (cm) = 0.3 (cm) Tissue and other material Viable, Non-Viable, Slough, Subcutaneous, Skin: Dermis , Skin: Epidermis, Slough debrided: Level: Skin/Subcutaneous Tissue Debridement Description: Excisional Instrument: Curette Bleeding: Moderate Hemostasis Achieved: Pressure End Time: 14:16 Procedural Pain: 0 Post Procedural Pain: 0 Response to Treatment: Procedure was tolerated well Level of Consciousness Awake and Alert (Post-procedure): Post Debridement Measurements of Total Wound Length: (cm) 0.5 Width: (cm) 0.6 Depth: (cm) 0.2 Volume: (cm) 0.047 Character of Wound/Ulcer Post Improved Debridement: Severity of Tissue Post Debridement: Fat layer exposed Post Procedure Diagnosis Same as Pre-procedure Electronic Signature(s) Signed: 12/16/2019 5:18:57 PM By: [name] MD Signed: 12/17/2019 5:45:19 PM By: [name] RN Entered By: [name] on 12/16/2019 13:37:40 -------------------------------------------------------------------------------- HPI Details Patient Name: Date of Service: Todd, Guzman 12/16/2019 12:30  PM Medical Record [id number] Patient Account Number: [id number] Date of Birth/Sex: Treating RN: 01-Apr-1946 (73 y.o. [name] Primary Care Provider: Rory Guzman Other Clinician: Referring Provider: Treating Provider/Extender:[name], [name], [name] in Treatment: 20 History of Present Illness HPI Description: 05/18/16; this is a 74year-old diabetic who is a type II diabetic on insulin. The history is that he traumatized his right foot developed a sore sometime in late March. 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 According to the patient and his wife this wound never really healed. He was seen on 2 occasions in the wound care center in [place] and had vascular studies and then was referred urgently to Dr. Bridgett Larsson of vascular surgery. He underwent an angiogram on 05/10/16. Unfortunately nothing really could be done to improve his vascular status. He had a 75-90% stenosis in the midsegment of 1 segment of the posterior femoral artery. He had a patent popliteal, his anterior tibial occluded shortly after takeoff. Perineal had a greater than 90% stenosis posterior tibial is occluded feet had no distal collaterals feed distal aspect of the transmetatarsal amputation site. The patient tells me that he had a prolonged period of Santyl by Dr. Doran Durand was some initial improvement but then this was stopped. I think they're only applying daily dressings/dry dressings. He has not had a recent x-ray of the right foot he did have one before his surgery in April. His wife by the dimensions of the wound/surgical site being followed at home since 4/20. At that point the dimensions were 0.5 x 12 x 0.2 on 7/19 this was 1.8 x 6 x 0.4. He is not currently on any  antibiotics. His hemoglobin A1c in early April was 12.9 at that point he was started on insulin. Apparently his blood sugars are much lower he has an appointment  with Dr. Legrand Como Alteimer of endocrine next week. 05/29/16 x-ray of the area did not show osteomyelitis. I think he probably needs an MRI at this point. His wife is asking about something called"Yireh" cream which is not FDA approved. I have not heard of this. 06/22/16; MRI did not really suggest osteomyelitis. There was minimal marrow edema and enhancement in the stump of the second metatarsal felt to be secondary likely to postoperative change rather than osteomyelitis. The patient has arranged his own consultation with Dr. Andree Elk at [place], apparently their daughter lives in [place] and has some connection here. Any improvement in vascular supply by Dr. Andree Elk would of course be helpful. Dr. Bridgett Larsson did not feel that anything further could be done other than amputation if wound care did not result in healing or if the area deteriorates. 06/26/16; the patient has been to see Dr. Andree Elk at [place] and had an angiogram. He is going for a procedure on Thursday which will involve catheterization. I'm not sure if this is an anterograde or retrograde approach. He has been using Santyl to the wound 07/10/16; the patient had a repeat angiogram and angioplasty at [place] by Dr. Brunetta Jeans. His angiogram showed right CFA and profundal widely patent. The right as of a.m. popliteal artery were widely patent the right anterior tibial was occluded proximally and reconstitutes at the ankle. Peroneal artery was patent to the foot. Posterior tibial artery was occluded. The patient had angioplasty of the anterior tibial artery.. This was quite successful. He was recommended for Plavix as well as aspirin. 07/17/16; the patient was close to be a nurse visit today however the outer dressing of the Apligraf fell off. Noted drainage. I was asked to see the wound. The patient is  noted an odor however his wife had noted that. Drainage with Apligraf not necessarily a bad thing. He has not been systemically unwell 07/24/16; we are still have an issue with drainage of this wound. In spite of this I applied his second Apligraf. Medially the area still is probing to bone. 08/07/16; Apligraf reapplied in general wound looks improved. 08/21/16 Apligraf #4. Wound looks much better 09/04/16 patientt's wound again today continues to appear to improve with the application of the Apligraf's. He notes no increased discomfort or concerns at this point in time. 09/18/16; the patient returns today 2 weeks after his fifth application of Apligraf. Predictably three quarters of the width of this wound has healed. The deep area that probe to bone medially is still open. The patient asked how much out-of-pocket dollars would be for additional Apligraf's. 09/25/16; now using Hydrofera Blue. He has completed 5 Apligraf applications with considerable improvement in this deep open transmetatarsal amputation site. His wound is now a triangular-shaped wound on the medial aspect. At roughly 12 to 2:00 this probes another centimeter but as opposed to in the past this does not probe to bone. The patient has been seen at [place] by Dr. Zenia Resides. He is not planning to do any more revascularization unless the wound stalls or worsens per the patient 10/02/16; 0.7 x 0.8 x 0.8. Unfortunately although the wound looks stable to improved. There is now easily probable bone. This hasn't been present for several weeks. Patient is not otherwise symptomatic he is not experiencing any pain. I did a culture of the wound bed 10/09/16. Deterioration last week. Culture grew MRSA and although there is improvement here with doxycycline prescribed over the phone I'm going to try to get him linezolid 600 twice a  day for 10 days today. 10/16/16; he is completing a weeks worth of linezolid and still has 3 more days to go. Small  triangular-shaped open area with some degree of undermining. There is still palpable bone with a curet. Overall the area appears better than last week 10/20/16 he has completed the linezolid still having some nausea and vomiting but no diarrhea. He has exposed bone this week which is a deterioration. 10/27/16 patient now has a small but probing wound down to bone. Culture of this bone that I did last week showed a few methicillin-resistant staph aureus. I have little doubt that this represents acute/subacute osteomyelitis. The patient is currently on Doxy which I will continue he also completed 10 days of linezolid. We are now in a difficult situation with this patient's foot after considerable discussion we will send him back to see Dr. Doran Durand for a surgical opinion of this I'm also going to try to arrange a infectious disease consult at [facility] hopefully week and get this prior to her usual 4-6 weeks we having [place]. The patient clearly is going to need 6 weeks of IV vancomycin. If we cannot arrange this expediently I'll have to consider ordering this myself through a home infusion company 11/03/16; the patient now has a small in terms of circumference but probing wound. No bone palpable today. The patient remains on doxycycline 100 twice a day which should support him until he sees infectious disease at [facility] next week the following week on Wednesday I believe he has an appointment with Dr. Andree Elk at [facility] who is his vascular cardiologist. Finally he has an appointment with Dr. Doran Durand on 11/22/16 we have been using silver alginate. The patient's wife states they are having trouble getting this through [facility] 11/13/16; the patient was seen by infectious disease at [facility] in the 11th PICC line placed in preparation for IV antibiotics. A tummy he has not going to get IV vancomycin o Ceftaroline. They've also ordered an MRI. Patient has a follow-up with Dr. Doran Durand on 11/22/16 and Dr. Andree Elk at  [facility] tomorrow 11/23/16 the patient is on daptomycin as directed by infectious disease at [facility]. He is also been back to see Dr. Andree Elk at [facility]. He underwent a repeat arteriogram. He had a successful PTA of the right anterior tibial artery. He is on dual antiplatelete treatment with Plavix and aspirin. Finally he had the MRI of his foot in [place]. This showed cellulitis about the foot worse distally edema and enhancement in the reminiscent of the second metatarsal was consistent with osteomyelitis therefore what I was assuming to be the first metatarsal may be actually the second. He also has a fluid collection deep to the calcaneus at the level of the calcaneal spur which could be an abscess or due to adventitial bursitis. He had a small tear in his Achilles 11/30/16; the patient continues on daptomycin as directed by infectious disease at [facility]. He is been revascularized by Dr. Andree Elk at [place] in [place]. He has been to see Gretta Arab who was the orthopedic surgeon who did his original amputation. I have not seen his not however per the patient's wife he did not offer another surgical local surgical prodecure to remove involved bone. He verbalized his usual disbelief in not just hyperbarics but any medical therapy for this condition(osteomyelitis). I discussed this in detail with the patient today including answering the question about a BKA definitively "curing" the current condition. 12/07/16; the patient continues on daptomycin as  directed by infectious disease at [facility]. This is directed at the MRSA that we cultured from his bone debridement from 12/22. Lab work today shows a white count of 8.7 hemoglobin of 10.5 which is microcytic and hypochromic differential count shows a slightly elevated monocyte count at 1.2 eosinophilic count of 0.6. His creatinine is 1.11 sedimentation rate apparently is gone from 35-34 now 40. I explained was wife I don't think this represents  a trend. His total CK is 48 12/14/16- patient is here for follow-up evaluation of his right TMA site. He continues to receive IV daptomycin per infectious disease. His serum inflammatory markers remain elevated. He complains of intermittent pain to the medial aspect of the TMA site with intermittent erythema. He voices no complaints or concerns regarding hyperbaric therapy. Overall he and his wife are expressing a frustration and discouragement regarrding the length of time of treatment. 12/21/16; small open wound at roughly the first or second metatarsal metatarsalphalyngeal joint reminiscence of his transmetatarsal amputation site he continues to receive IV daptomycin per infectious disease at [facility]. He will finish these a week tomorrow. He has lab work which I been copied on. His white count is 10.8 hemoglobin 8.9 MCV is low at 75., MCH low at 24.5 platelet count slightly elevated at 626. Differential count shows 70% neutrophils 10% monocytes and 10% eosinophils. His comprehensive metabolic panel shows a slightly low sodium at 133 albumin low at 3.1 total CK is normal at 42 sedimentation rate is much higher at 82. He has had iron studies that show a serum iron of 15 and iron binding capacity of 238 and iron saturation of 6. This is suggestive of iron deficiency. B12 and folate were normal ferritin at 113 The patient tells me that he is not eating well and he has lost weight. He feels episodically nauseated. He is coughing and gagging on mucus which she thinks is sinusitis. He has an appointment with his primary doctor at 5:00 this afternoon in [facility] 01/02/17; the patient developed a subacute pneumonitis. He was admitted to [facility] after a CT scan showed an extensive interstitial pneumonitis [I have not yet seen this]. 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 He has since  been discharged. He is not on oxygen. He feels fatigued and very short of breath with exertion. At [facility] the wound care nurse there felt that his wound was healed. He did complete his daptomycin and has follow-up with infectious disease on Friday. X-rays I did before he went to [facility] still suggested residual osteomyelitis in the anterior aspect of the second must metatarsal head. I'm not sure I would've expected any different. There was no other findings. I actually think I did this because of erythema over the first metatarsal head reminiscent 01/11/17; the patient has eosinophilic pneumonitis. He has been reviewed by pulmonology and given clearance for hyperbarics at least that's what his wife says. I'll need to see if there is note in care everywhere. Apparently the prognosis for improvement of daptomycin induced eosinophilic granulocyte is is 3 months without steroids. In the meantime infectious disease has placed him on doxycycline until the wound is closed. He is still is lost a lot of weight and his blood sugars are running in the mid 60s to low 80s fasting and at all times during the day 01/18/17; he has had adjustments in his insulin apparently his blood sugars in the morning or over  100. He wants to restart his hyperbaric treatment we'll do this at 1:00. 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 I think there is good reason to complete his treatments in order to give him the best chance of maintaining a healed status and these DFU 3 wounds with MRSA infection in the bone 02/15/17; the patient was seen today in conjunction with HBO. He completed hyperbaric oxygen today. The open area on his transmetatarsal site has remained closed. There was an area of erythema when I saw him earlier in the week on the posterior heel although that is resolved as of today as well. He has been using a cam walker. This  is a patient who came to [place] after a transmetatarsal amputation that was necrotic and dehisced. He required revascularization percutaneously on 2 different occasions by Dr. Andree Elk of invasive cardiology at [facility]. 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 He went to [facility] infectious disease and had IV daptomycin for 5 weeks before developing eosinophilic pneumonitis and requiring an admission to hospital/ICU. He made a good recovery and is continued on doxycycline since. As mentioned his foot is closed now. He has a follow-up with Dr. Andree Elk tomorrow. He is going to [employer] on Monday for a custom-made shoe READMISSION Last visit Dr. Andree Elk [facility] 02/16/17 1. Critical limb ischemia of the RLE: s/p transmetatarsal amputation of the RLE, now completely healed. s/p right ATA percutaneous revascularization procedure x2, most recently 11/20/2016 s/p PTA of right AT 100% to less than 20% with a 2.5 x 200 balloon. He does have some residual osteomyelitis, but given the wound is closed, the orthopedist has recommended to follow. He has a fitting for a special shoe coming up next week. He has completed a course of daptomycin and doxycycline and he has been discharged by ID. He has completed a course of hyperbaric therapy. Continue Continue medical management with aspirin, Plavix and statin therapy. We will discuss ongoing Plavix therapy at follow-up in 6 months. 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 Given no symptoms, we will conservatively manage. He doesn't want to do any more invasive studies at this time, which is reasonable. The patient arrives today out of 2 concerns both on the right transmetatarsal site. 1 at the level of the reminiscent fifth metatarsal head and  the other at roughly the first or second. Both of these look like dark subcutaneous discoloration probably subdermal bleeding. He is recently obtained new adaptive footwear for the right foot. He also has a callus on the left fifth dorsal toe however he follows with podiatry for this and I don't think this is any issue. ABIs in this clinic today were 0.66 on the right and 1.06 on the left. On entrance into our clinic initially this was 0.95 and 0.92. He does not describe current claudication. They're going away on a cruise in 8 weeks and I think are trying to do the month is much as they can proactively. They follow with podiatry and have an appointment with Dr. Andree Elk in October READMISSION 06/25/18 This is a patient that we have not seen in almost a year. He is a type II diabetic with known PAD. He is followed by Dr. Andree Elk of interventional cardiology at [facility] in [place] [place]. He is required revascularization for significant PAD. When we first saw him he required a transmetatarsal amputation. He had underlying osteomyelitis with a nonhealing surgical wound. This eventually closed with wound care, IV antibiotics and hyperbaric oxygen. They tell me that he has a modified shoe and he is very active walking up to 4 miles a day. He is followed by Dr. Geroge Baseman of podiatry. His wife states that he underwent a removal of callus over this site on July 9. She felt there may be drainage from this site after that although she could never really determined and there was callus buildup again. On 06/01/18 there was pressure bleeding through the overlying callus. he was given a prescription for 7 days of Bactrim. Fortuitously he has an appointment with Dr. Andree Elk on 06/04/18 and he immediately underwent revascularization of the right leg although I have not had a chance to review these records in care everywhere. This was apparently done through anterior and retrograde access. On 06/06/18 he had another debridement by  Dr. Bernette Mayers. Vitamin soaking with Epsom salts for 20 minutes and applying calcium alginate. The original trans-met was on April 2017 I believe by Dr. Doran Durand. His ABI in our clinic was noncompressible today. 07/02/18; x-ray I ordered last week was negative for osteomyelitis. Swab culture was also negative. He is going to require an MRI which I have ordered today. 07/09/18; surprisingly the MRI of the foot that I ordered did not show osteomyelitis. He did suggest the possibility of cellulitis. For this reason I'll go ahead and give him a 10 day course of doxycycline. Although the previous culture of this area was negative 07/16/18; it arrives with the wound looking much the same. Roughly the same depth. He has thick subcutaneous tissue around the wound orifice but this still has roughly the same depth. Been using silver alginate. We applied Oasis #1 today 07/23/2018; still having to remove a lot of callus and thick subcutaneous tissue to actually define the wound here. Most of this seems to have closed down yet he has a comma shaped divot over the top of the area that still I think is open. We applied Oasis #2 His wife expressed concern about the tip of his left great toe. This almost looks like a small blister. She also showed it today to her podiatrist Dr. Geroge Baseman who did not think this was anything serious. I am not sure is anything serious either however I think it bears some watching. He is not in any pain however he is insensate 07/30/2018;; still on a lot of nonviable tissue over the surface of the wound however cleaning this up reveals a more substantial wound orifice but less of a probing wound depth. This is not probed to bone. There is no evidence of infection The wife is still concerned about a non-open area on the tip of his left great toe. Almost feels like a bony outgrowth. She had previously showed this to podiatry. I do not think this is a blister. A friction area would be possible  although he is really not walking according to his wife. He had a small skin tag on his right buttock but no open wound here either 08/06/2018; we applied a third Oasis last week. Unfortunately there is really no improvement. Still requiring extensive debridement to expose the wound bed from a horizontal slitlike depression. I still have not been able to get this to fill in properly. On the positive side there is now no probable  bone from when he first came into the facility. I changed him to silver alginate today after a reasonably aggressive debridement 08/13/2018; once again the patient comes in with skin and subcutaneous tissue closing over the small probing area with the underlying cavity of the wound on the right TMA site. I applied silver alginate to this last week. Prior to that we used Oasis x3 still not able to get this area granulating. He does not have a probing area of the bone which is an improvement from when I spur started working on this and an MRI did not suggest osteomyelitis. I do not see evidence of infection here but I am increasingly concerned about why I cannot get this area to granulate. Each time I debrided this is looks like this is simply a matter of getting granulation to fill in the hole and then getting epithelialization. This does not seem to happen Also I sent him back to see podiatry Dr. Earleen Newport about the what felt to be bony outgrowth on the tip of his left great toe. Apparently after heel he left the clinic last week or the next day he developed a blood blister. He did see Dr. Earleen Newport. He went on to have a debridement of the medial nail cuticle he now has an open area here as well as some denuded skin. They did an x-ray apparently does have a bony outgrowth or spur but I am not able to look at this. They are using topical antibiotics apparently there was some suggested he use Santyl. Patient's wife was anxious for my opinion of this 08/20/2018; we are able to keep  the wound open this time instead of the thick subcutaneous tissue closing over the top of it however unfortunately once again this probes to bone. I do not see any evidence of infection and previous MRI did not show osteomyelitis. I elected to go back to the Oasis to see if we can stimulate some granulation. Last saw his vascular interventional cardiologist Dr. Andree Elk at the beginning of August and he had a repeat procedure. Nevertheless I wonder how much blood flow he has down to this area With regards to the left first toe he is seeing Dr. Earleen Newport next week. They are applying Bactroban to this area. 08/27/2018 Once again he comes in with thick eschar and subcutaneous tissue over the top of the small probing hole. This does not appear to go down to bone but it still has roughly the same depth. I reapplied Oasis today Over the left great toe there appears to be more of the wound at the tip of his toe than there was last week. This has an eschar on the surface of it. Will change to Santyl. There is seeing podiatry this afternoon 09/03/2018 He comes in today with the area on the transmetatarsal's site with a fair amount of callus, nonviable tissue over the circumference but it was not closed. I removed all of this as well as some subcutaneous debris and reapplied Oasis. There is no exposed bone The area over the tip of the left great toe started off as a nodule of uncertain etiology. He has been followed with podiatry. They have been removing part of the medial nail bed. He has nonviable tissue over the wound he been using Santyl in this area 09/10/18 Unfortunately comes in with neither wound area looking improved. The transmetatarsal amputation site once Again has nonviable debris over the surface requiring debridement. Unfortunately underneath this there is nothing that looks  viable and this once again goes right down to bone. There is no purulent drainage and no erythema. Also the surgical wound  from podiatry on the left first toe has an ischemic-looking eschar over the surface of the tip of the toe I have elected not to attempt candidly debride this His wife as arranged for him to have follow-up noninvasive studies in [place] under the care of Dr. Andree Elk clinic. The question is he has known severe PAD. They had recently seen him in August. He has had revascularizations in both legs within the last 4 or 5 months. He is not complaining of pain and I cannot really get a history of claudication. He has not systemically unwell 09/20/2018 the patient has been to [facility] and been revascularized by Dr. Andree Elk earlier this week. Apparently he was able to open up the anterior tibial artery although I have not actually seen his formal report. He is going for an attempt to revascularize on the left on Monday. He is apparently working with an investigational stent for lower extremity arteries below the knee and he has talked to the patient about placing that on Monday if possible. The patient has been using silver alginate on the transmetatarsal amputation site and Santyl on the left 09/30/2018; patient had his revascularization on the left this apparently included a standard approach as well as a more distal arterial catheterization although I do not have any information on this from Dr. Andree Elk. In fact I do not even see the initial revascularization that he had on the right. I have included the arterial history from Dr. Andree Elk last note however below; ASSESSMENT/PLAN: 1. Hx of Critical limb ischemia bilateral lower extremities, PAD: -s/p transmetatarsal amputation of the RLE. -s/p right ATA percutaneous revascularization procedure x2, most recently 11/20/2016 s/p PTA of right AT 100% to less than 20% with a 2.5 x 200 balloon. -On original angiogram 05/15/2016, he had a significant 70-95% left popliteal artery stenosis with AT and PT artery occlusions and one vessel runoff via the peroneal  artery. -03/21/2018 s/p PTA of 90% left popliteal artery to <10% with a 5x20 cutting balloon, PTA of 90% left peroneal to <20% with a 3x20 balloon, PTA of 100% left AT to <20% with a 2.5x220 balloon. 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 Will plan on the LLE soon after. Risks/benefits of procedure have been discussed and patient has elected to proceed. We have been using endoform to the right TMA amputation site wound and Santyl to the left great toe 10/07/2018; the area on the tip of his left great toe looked better we have been using Santyl here. We continue to have a very difficult probing hole on the right TMA amputation site we have been using endoform. I went on to use his fifth Oasis today 10/14/2018; the tip of the left great toe continues to look better. We have been using Santyl here the surface however is healthy and I think we can change to an alginate. The right TMA has not changed. Once again he has no superficial opening there is callus and thick subcutaneous tissue over the orifice once you remove this there is the probing area that we have been dealing with without too much change. There is no palpable bone I have been placing Oasis here and put Oasis #6 in this today after a  more vigorous debridement 10/21/18; the left great toe still has necrotic surface requiring debridement. The right TMA site hasn't changed in view of the thick callus over the wound bed. With removal of this there is still the opening however this does not appear to have the same depth. Again there is no palpable bone. Oasis was replaced 10/28/2018; patient comes in with both wounds looking worse. The area over the first toe tip is now down to bone. The area over the TMA site is deeper and down to bone clearly with a increase in overall wound area. Equally  concerning on the right TMA is the complete absence of a pulse this week which is a change. We are not even able to Doppler this. On the right he has a noncompressible ABI greater than 1.4 11/04/2018. Both wounds look somewhat worse. X-rays showed no osteomyelitis of the right foot but on the left there was underlying osteomyelitis in the left great toe distal phalanx. I been on the phone to Dr. Andree Elk surface at [facility] in [place] and they are arranging for another angiogram on the right on Monday. I have him on doxycycline for the osteomyelitis in the left great toe for now. Infectious disease may be necessary 1/17; 2-week hiatus. Patient was admitted to hospital at [place]. My understanding is he underwent an angioplasty of the right anterior tibial artery and had stents placed in the left anterior artery and the left tibial peroneal trunk. This was done by Dr. Andree Elk of interventional radiology. There is no major change in either 1 of the wounds. They have been using Aquacel Ag. As far as they are aware no imaging studies were done of the foot which is indeed unfortunate. I had him on doxycycline for 2 weeks since we identified the osteomyelitis in the left great toe by plain x-ray. I have renewed that again today. I am still suspicious about osteomyelitis in the amputation site and would consider doing another MRI to compare with the one done in September. The idea of hyperbaric oxygen certainly comes up for discussion 1/24; no major change in either wound area. I have him on doxycycline for osteomyelitis at the tip of the left great toe. As noted he has been previously and recently revascularized by Dr. Andree Elk at [place]. He is tolerating the doxycycline well. For some reason we do not have an infectious disease consult yet. Culture of drainage from the right foot site last week was negative 1/31; MRI of the right foot did not show osteomyelitis of the right ankle and foot. Notable for a skin  ulceration overlying the second metatarsal stump with generalizing soft tissue edema of the ankle and foot consistent with cellulitis. Noted to have a partial-thickness tear of the Achilles tendon 7.5 cm proximal to the insertion Nothing really new in terms of symptoms. Patient's area on the tip of the left great toe is just about closed although he still has the probing area on the metatarsal amputation site. Appointment with Dr. Linus Salmons of infectious disease next week 2/7; Dr. Novella Olive did not feel that any further antibiotics were necessary he would follow-up in 2 months. The left great toe appears to be closed still some surface callus that I gently looked under high did not see anything open or anything that was threatening to be open. He still has the open area on the mid part of his TMA site using endoform 2/14; left great toe is closed and he is completing his doxycycline as of  last Sunday. He is not on any antibiotics. Unfortunately out of the right foot his wife noticed some subdermal hemorrhage this week. He had been walking 2 miles I had given him permission to do so this is not really a plantar wound. Using endoform to this wound but I changed to silver alginate this week 2/21; left great toe remains closed. Culture last week grew Streptococcus angiosis which I am not really familiar with however it is penicillin sensitive and I am going to put him on Augmentin. Not much change in the wound on the right foot the deep area is still probing precariously close to bone and the wound on the margin of the TMA is larger. 2/28; left great toe remains closed. He is completing the Augmentin I gave him last week. 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 This is being shown to Dr. Andree Elk at [place] to see if there is anything else that can be done here. He has been using silver alginate strips on the right 3/6; left great toe remains closed. He sees Dr. Andree Elk on  Monday. The area on the plantar aspect of the right foot has the same small orifice with thick callused tissue around this. However this time with removal of the callus tissue the wound is open all the way along the incision line to the end medially. We have been using silver alginate. 3/13; left toe remains closed although the area is callused. He sees Dr. Jacqualyn Posey of podiatry next week. The area on the plantar right foot looked a lot better this week. Culture I did of this was negative we use silver alginate. He is going next week for an attempt at revascularization by Dr. Andree Elk 3/23; left toe remains closed although the area is callused. He will see Dr. Jacqualyn Posey in follow-up. The area on the right plantar foot continues to look surprisingly better over the last 3 visits. We have been using silver alginate. The revascularization he was supposed to have by Dr. Andree Elk at [facility] in [place] has been canceled [facility] procedure] 4/6; the right foot remains closed albeit callused. Podiatry canceled the appointment with regards to the left great toe. He has not seen Dr. Andree Elk at [facility] but thinks that Dr. Andree Elk has "done all he can do". He would be a candidate for the hemostaemix trial Readmission 07/29/2019 Mr. Stann Mainland is a man we know well from at least 3 previous stays in this clinic. He is a type II diabetic with severe PAD followed by Dr. Andree Elk at [facility] in [place]. 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 So far everything in both of these areas has remained closed. About 2 weeks ago he went to see his podiatrist [facility] Dr. Babs Bertin. He had a thick callus on the left fifth metatarsal head that was shaved. He has developed an open wound in this area. They have been offloading this in his diabetic shoes. The patient has not had any more  revascularizations by Dr. Andree Elk since the last time he was here. ABI in our clinic at the posterior tibial on the left was 1.06 10/6; no real change in the area on the plantar met head. Small wound with 2 mm of depth. He has thick skin probably from pressure around the wound. We have been using  silver alginate 10/13; small wound in the left fifth plantar met head. Arrives today with undermining laterally and purulent drainage. Our intake nurse cultured this. His wife stated they noticed a change in color over the last day or 2. He is not systemically unwell 10/19; small wound on the fifth plantar metatarsal head. Culture I did last week showed Staphylococcus lugdunensis. Although this could be a skin contaminant the possibility of a skin and soft tissue infection was there. I did give him empiric doxycycline which should have covered this. We are using silver alginate to the wound. We put him in a total contact cast today. 10/22; small wound on the fifth plantar metatarsal head. He has completed antibiotics. He also has severe PAD which worries me about just about any wound on this man's foot 10/29; small superficial area on the fifth plantar metatarsal head. Measuring slightly smaller. He is not currently on any antibiotics. I been using a total contact cast in this man with very severe PAD but he seems to be tolerating this well 11/5; left plantar fifth metatarsal head. Silver alginate being used under a total contact cast 11/12; wound not much different than last week. Using a #15 scalpel debridement around the wound. Still silver collagen under a total contact cast. He has severe PAD and that may be playing a role in this 11/19; disappointing that the wound is not really changed that much. I think it is come down in overall surface area because originally this was on the lateral part of the fifth metatarsal head however recently it is not really changed. Most of this is filled in but it will  not epithelialized there is surface debris on this which may be mostly related to ischemia. They have an appointment with Dr. Andree Elk on 12/9 09/24/2019 on evaluation today patient appears to be doing somewhat better with regard to the wound on the left fifth metatarsal head. Fortunately there does not appear to be any signs of active infection at this time. No fevers, chills, nausea, vomiting, or diarrhea. The wound does not appear to be completely closed and I do feel like the Hydrofera Blue is helping to some degree although I feel like the cast as well as keeping things from breaking down or getting any larger at least. As far as his wound overview it shows that the overall size of the wound is slightly smaller but really maintaining within the realm of about the same. He had no troubles with the cast which is good news. 12/1; left fifth metatarsal head. Perhaps somewhat more vibrant. We have been using Hydrofera Blue. He sees Dr. Andree Elk at [place] 1 week tomorrow. He be back next week and I hope to have a better idea which way the wound is going however I will not cast him next week in case Dr. Andree Elk wants to reevaluate things in his foot. The left leg is the leg with the stent in place and I wonder whether these are going to have to be reevaluated. 12/8; left fifth metatarsal head. Quite a bit better this week. Only a small open area remains. He sees Dr. Andree Elk at [place] tomorrow. Dr. Andree Elk is previously done his vascular interventions. He has 2 stents in the left leg as I remember things. I therefore will not put him in a total contact cast today. He will be using a forefoot off loader. We will use silver collagen on the wound. We will see him again next week 12/15; left fifth metatarsal  head. He saw Dr. Andree Elk and is scheduled for an angiogram on Thursday. Unfortunately although his wound was a lot better last week it is really deteriorated this week. Small punched-out hole with depth and overhanging  tissue. We have been using Hydrofera Blue 12/22; patient had an 80% stenosis proximal to the stent I believe in the below-knee popliteal artery. This was opened by Dr. Andree Elk. According the patient the stent itself was satisfactory. I have not been able to review this note. 12/29; patient arrives today with the wound about the same size some undermining medially. We have been using Hydrofera Blue under a total contact cast and that is what we will do again today 11/04/2019. Slightly smaller in orifice but about 4 mm undermining almost circumferentially. We have been using Hydrofera Blue. Apparently the Hydrofera Blue did not stay on the wound after we removed the cast. Some minor looking cast irritation on the right lateral 1/12; unfortunately things did not go well this week. Arrives in clinic today with a deeper wound with undermining more concerning a area of pus. Specimen was obtained for culture. We are not going to be able to put him in a cast today. 1/19; purulent material last week which I cultured showed Enterobacter cloacae. He was on ampicillin previously prescribed by his primary doctor which should not have covered this however mysteriously the wound looks somewhat better. There is less depth less erythema certainly no drainage. He will need a course of ciprofloxacin which I will provide today. 1/26; he has completed the ciprofloxacin. I don't think he needs any additional antibiotics. The areas on the left fifth plantar met head. We've been using silver alginate 2/2; comes in today with 0.4 cm of circumferential undermining around a small wound in this area. We have been using silver alginate with a forefoot offloading boot 2/9; again the wound appears larger nonviable surface and with 0.4 cm roughly of circumferential undermining. We have been using silver alginate with a forefoot offloading boot. The wound does not look infected however his wife is quick to point out about swelling  on the dorsal foot. 2/16; not much change. Comes in with a small wound with a nonviable necrotic surface. Again marked undermining that I had totally removed last week. He had a arterial Doppler last week in Dr. Andree Elk office at [facility]. They have not heard these results. They are somewhat frustrated. Electronic Signature(s) Signed: 12/16/2019 5:18:57 PM By: [name] MD Entered By: [name] on 12/16/2019 13:38:28 -------------------------------------------------------------------------------- Physical Exam Details Patient Name: Date of Service: CALLAHAN, WILD 12/16/2019 12:30 PM Medical Record [id number] Patient Account Number: [id number] Date of Birth/Sex: Treating RN: September 13, 1946 (73 y.o. [name] Primary Care Provider: Rory Guzman Other Clinician: Referring Provider: Treating Provider/Extender:Nonna Renninger, [name], [name] in Treatment: 20 Constitutional Sitting or standing Blood Pressure is within target range for patient.. Pulse regular and within target range for patient.Marland Kitchen Respirations regular, non-labored and within target range.. Temperature is normal and within the target range for the patient.Marland Kitchen Appears in no distress. Notes Wound exam; no purulent drainage but a fibrinous surface completely nonviable again with circumferential undermining of almost 0.5 cm. I debrided the area gently with a #3 curette there is bleeding but the tissue generally looks pale. Electronic Signature(s) Signed: 12/16/2019 5:18:57 PM By: [name] MD Entered By: [name] on 12/16/2019 13:39:11 -------------------------------------------------------------------------------- Physician Orders Details Patient Name: Date of Service: MCCAULEY, DIEHL 12/16/2019 12:30 PM Medical Record [id number] Patient Account Number: [id number] Date of Birth/Sex:  Treating RN: [birth year] (73 y.o. [name]) Epps, Morey Hummingbird Primary Care Provider: Other Clinician: Rory Guzman Referring Provider: Treating Provider/Extender:Wilda Wetherell, [name], [name] in Treatment: 6 Verbal / Phone Orders: No Diagnosis Coding ICD-10 Coding Code Description E11.621 Type 2 diabetes mellitus with foot ulcer E11.51 Type 2 diabetes mellitus with diabetic peripheral angiopathy without gangrene L97.521 Non-pressure chronic ulcer of other part of left foot limited to breakdown of skin L03.116 Cellulitis of left lower limb Follow-up Appointments Return Appointment in 1 week. Dressing Change Frequency Wound #5 Left Metatarsal head fifth Change Dressing every other day. Wound Cleansing Wound #5 Left Metatarsal head fifth May shower and wash wound with soap and water. Primary Wound Dressing Wound #5 Left Metatarsal head fifth Polymem Silver Other: - pad right great toe for protection Secondary Dressing Kerlix/Rolled Gauze Dry Gauze Off-Loading Other: - Fore foot off loader Electronic Signature(s) Signed: 12/16/2019 5:18:57 PM By: [name] MD Signed: 12/17/2019 5:45:19 PM By: [name] RN Entered By: [name] on 12/16/2019 13:13:12 -------------------------------------------------------------------------------- Problem List Details Patient Name: Date of Service: OCTAVION, MOLLENKOPF 12/16/2019 12:30 PM Medical Record [id number] Patient Account Number: [id number] Date of Birth/Sex: Treating RN: May 20, 1946 (73 y.o. Staci Acosta, Morey Hummingbird Primary Care Provider: Rory Guzman Other Clinician: Referring Provider: Treating Provider/Extender:Brooksie Ellwanger, [name], [name] in Treatment: 20 Active Problems ICD-10 Evaluated Encounter Code Description Active Date Today Diagnosis E11.621 Type 2 diabetes mellitus with foot ulcer 07/29/2019 No Yes E11.51 Type 2 diabetes mellitus with diabetic peripheral 07/29/2019 No Yes angiopathy without gangrene L97.521 Non-pressure chronic ulcer of other part of left foot 07/29/2019 No Yes limited to breakdown of  skin L03.116 Cellulitis of left lower limb 08/12/2019 No Yes Inactive Problems Resolved Problems Electronic Signature(s) Signed: 12/16/2019 5:18:57 PM By: [name] MD Entered By: [name] on 12/16/2019 13:37:18 -------------------------------------------------------------------------------- Progress Note Details Patient Name: Date of Service: ATHONY, COPPA 12/16/2019 12:30 PM Medical Record [id number] Patient Account Number: [id number] Date of Birth/Sex: Treating RN: September 02, 1946 (73 y.o. [name] Primary Care Provider: Rory Guzman Other Clinician: Referring Provider: Treating Provider/Extender:Anastacia Reinecke, [name], [name] in Treatment: 20 Subjective History of Present Illness (HPI) 05/18/16; this is a 73year-old diabetic who is a type II diabetic on insulin. The history is that he traumatized his right foot developed a sore sometime in late March. Shortly thereafter he went on a cruise but he had to get off the cruise ship in [place] and fly urgently back to [place] where he was admitted to [facility] and ultimately underwent a transmetatarsal amputation by Dr. 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 According to the patient and his wife this wound never really healed. He was seen on 2 occasions in the wound care center in [place] and had vascular studies and then was referred urgently to Dr. Bridgett Larsson of vascular surgery. He underwent an angiogram on 05/10/16. Unfortunately nothing really could be done to improve his vascular status. He had a 75-90% stenosis in the midsegment of 1 segment of the posterior femoral artery. He had a patent popliteal, his anterior tibial occluded shortly after takeoff. Perineal had a greater than 90% stenosis posterior tibial is occluded feet had no distal collaterals feed distal aspect of the transmetatarsal amputation site. The patient tells me that he had a prolonged period of Santyl by Dr. Doran Durand was some initial  improvement but then this was stopped. I think they're only applying daily dressings/dry dressings. He has not had a recent x-ray of the right foot he did have one before his  surgery in April. His wife by the dimensions of the wound/surgical site being followed at home since 4/20. At that point the dimensions were 0.5 x 12 x 0.2 on 7/19 this was 1.8 x 6 x 0.4. He is not currently on any antibiotics. His hemoglobin A1c in early April was 12.9 at that point he was started on insulin. Apparently his blood sugars are much lower he has an appointment with Dr. Legrand Como Alteimer of endocrine next week. 05/29/16 x-ray of the area did not show osteomyelitis. I think he probably needs an MRI at this point. His wife is asking about something called"Yireh" cream which is not FDA approved. I have not heard of this. 06/22/16; MRI did not really suggest osteomyelitis. There was minimal marrow edema and enhancement in the stump of the second metatarsal felt to be secondary likely to postoperative change rather than osteomyelitis. The patient has arranged his own consultation with Dr. Andree Elk at [place], apparently their daughter lives in [place] and has some connection here. Any improvement in vascular supply by Dr. Andree Elk would of course be helpful. Dr. Bridgett Larsson did not feel that anything further could be done other than amputation if wound care did not result in healing or if the area deteriorates. 06/26/16; the patient has been to see Dr. Andree Elk at [place] and had an angiogram. He is going for a procedure on Thursday which will involve catheterization. I'm not sure if this is an anterograde or retrograde approach. He has been using Santyl to the wound 07/10/16; the patient had a repeat angiogram and angioplasty at [place] by Dr. Brunetta Jeans. His angiogram showed right CFA and profundal widely patent. The right as of a.m. popliteal artery were widely patent the right anterior tibial was occluded proximally and reconstitutes at the  ankle. Peroneal artery was patent to the foot. Posterior tibial artery was occluded. The patient had angioplasty of the anterior tibial artery.. This was quite successful. He was recommended for Plavix as well as aspirin. 07/17/16; the patient was close to be a nurse visit today however the outer dressing of the Apligraf fell off. Noted drainage. I was asked to see the wound. The patient is noted an odor however his wife had noted that. Drainage with Apligraf not necessarily a bad thing. He has not been systemically unwell 07/24/16; we are still have an issue with drainage of this wound. In spite of this I applied his second Apligraf. Medially the area still is probing to bone. 08/07/16; Apligraf reapplied in general wound looks improved. 08/21/16 Apligraf #4. Wound looks much better 09/04/16 patientt's wound again today continues to appear to improve with the application of the Apligraf's. He notes no increased discomfort or concerns at this point in time. 09/18/16; the patient returns today 2 weeks after his fifth application of Apligraf. Predictably three quarters of the width of this wound has healed. The deep area that probe to bone medially is still open. The patient asked how much out-of-pocket dollars would be for additional Apligraf's. 09/25/16; now using Hydrofera Blue. He has completed 5 Apligraf applications with considerable improvement in this deep open transmetatarsal amputation site. His wound is now a triangular-shaped wound on the medial aspect. At roughly 12 to 2:00 this probes another centimeter but as opposed to in the past this does not probe to bone. The patient has been seen at [facility] by Dr. Zenia Resides. He is not planning to do any more revascularization unless the wound stalls or worsens per the patient 10/02/16; 0.7 x 0.8  x 0.8. Unfortunately although the wound looks stable to improved. There is now easily probable bone. This hasn't been present for several weeks. Patient is not  otherwise symptomatic he is not experiencing any pain. I did a culture of the wound bed 10/09/16. Deterioration last week. Culture grew MRSA and although there is improvement here with doxycycline prescribed over the phone I'm going to try to get him linezolid 600 twice a day for 10 days today. 10/16/16; he is completing a weeks worth of linezolid and still has 3 more days to go. Small triangular-shaped open area with some degree of undermining. There is still palpable bone with a curet. Overall the area appears better than last week 10/20/16 he has completed the linezolid still having some nausea and vomiting but no diarrhea. He has exposed bone this week which is a deterioration. 10/27/16 patient now has a small but probing wound down to bone. Culture of this bone that I did last week showed a few methicillin-resistant staph aureus. I have little doubt that this represents acute/subacute osteomyelitis. The patient is currently on Doxy which I will continue he also completed 10 days of linezolid. We are now in a difficult situation with this patient's foot after considerable discussion we will send him back to see Dr. Doran Durand for a surgical opinion of this I'm also going to try to arrange a infectious disease consult at [facility] hopefully week and get this prior to her usual 4-6 weeks we having [place]. The patient clearly is going to need 6 weeks of IV vancomycin. If we cannot arrange this expediently I'll have to consider ordering this myself through a home infusion company 11/03/16; the patient now has a small in terms of circumference but probing wound. No bone palpable today. The patient remains on doxycycline 100 twice a day which should support him until he sees infectious disease at [facility] next week the following week on Wednesday I believe he has an appointment with Dr. Andree Elk at [facility] who is his vascular cardiologist. Finally he has an appointment with Dr. Doran Durand on 11/22/16 we  have been using silver alginate. The patient's wife states they are having trouble getting this through [facility] 11/13/16; the patient was seen by infectious disease at [facility] in the 11th PICC line placed in preparation for IV antibiotics. A tummy he has not going to get IV vancomycin o Ceftaroline. They've also ordered an MRI. Patient has a follow-up with Dr. Doran Durand on 11/22/16 and Dr. Andree Elk at [facility] tomorrow 11/23/16 the patient is on daptomycin as directed by infectious disease at [facility]. He is also been back to see Dr. Andree Elk at [facility]. He underwent a repeat arteriogram. He had a successful PTA of the right anterior tibial artery. He is on dual antiplatelete treatment with Plavix and aspirin. Finally he had the MRI of his foot in [place]. This showed cellulitis about the foot worse distally edema and enhancement in the reminiscent of the second metatarsal was consistent with osteomyelitis therefore what I was assuming to be the first metatarsal may be actually the second. He also has a fluid collection deep to the calcaneus at the level of the calcaneal spur which could be an abscess or due to adventitial bursitis. He had a small tear in his Achilles 11/30/16; the patient continues on daptomycin as directed by infectious disease at [facility]. He is been revascularized by Dr. Andree Elk at [facility] in [place]. He has been to see Gretta Arab who was the orthopedic surgeon who  did his original amputation. I have not seen his not however per the patient's wife he did not offer another surgical local surgical prodecure to remove involved bone. He verbalized his usual disbelief in not just hyperbarics but any medical therapy for this condition(osteomyelitis). I discussed this in detail with the patient today including answering the question about a BKA definitively "curing" the current condition. 12/07/16; the patient continues on daptomycin as directed by infectious disease at [facility]. This is  directed at the MRSA that we cultured from his bone debridement from 12/22. Lab work today shows a white count of 8.7 hemoglobin of 10.5 which is microcytic and hypochromic differential count shows a slightly elevated monocyte count at 1.2 eosinophilic count of 0.6. His creatinine is 1.11 sedimentation rate apparently is gone from 35-34 now 40. I explained was wife I don't think this represents a trend. His total CK is 48 12/14/16- patient is here for follow-up evaluation of his right TMA site. He continues to receive IV daptomycin per infectious disease. His serum inflammatory markers remain elevated. He complains of intermittent pain to the medial aspect of the TMA site with intermittent erythema. He voices no complaints or concerns regarding hyperbaric therapy. Overall he and his wife are expressing a frustration and discouragement regarrding the length of time of treatment. 12/21/16; small open wound at roughly the first or second metatarsal metatarsalphalyngeal joint reminiscence of his transmetatarsal amputation site he continues to receive IV daptomycin per infectious disease at [facility]. He will finish these a week tomorrow. He has lab work which I been copied on. His white count is 10.8 hemoglobin 8.9 MCV is low at 75., MCH low at 24.5 platelet count slightly elevated at 626. Differential count shows 70% neutrophils 10% monocytes and 10% eosinophils. His comprehensive metabolic panel shows a slightly low sodium at 133 albumin low at 3.1 total CK is normal at 42 sedimentation rate is much higher at 82. He has had iron studies that show a serum iron of 15 and iron binding capacity of 238 and iron saturation of 6. This is suggestive of iron deficiency. B12 and folate were normal ferritin at 113 The patient tells me that he is not eating well and he has lost weight. He feels episodically nauseated. He is coughing and gagging on mucus which she thinks is sinusitis. He has an appointment with his  primary doctor at 5:00 this afternoon in [facility] 01/02/17; the patient developed a subacute pneumonitis. He was admitted to [facility] after a CT scan showed an extensive interstitial pneumonitis [I have not yet seen this]. 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 He has since been discharged. He is not on oxygen. He feels fatigued and very short of breath with exertion. At [facility] the wound care nurse there felt that his wound was healed. He did complete his daptomycin and has follow-up with infectious disease on Friday. X-rays I did before he went to [facility] still suggested residual osteomyelitis in the anterior aspect of the second must metatarsal head. I'm not sure I would've expected any different. There was no other findings. I actually think I did this because of erythema over the first metatarsal head reminiscent 01/11/17; the patient has eosinophilic pneumonitis. He has been reviewed by pulmonology and given clearance for hyperbarics at least that's what his wife says. I'll need to see if there is note in care everywhere. Apparently the prognosis for improvement of  daptomycin induced eosinophilic granulocyte is is 3 months without steroids. In the meantime infectious disease has placed him on doxycycline until the wound is closed. He is still is lost a lot of weight and his blood sugars are running in the mid 60s to low 80s fasting and at all times during the day 01/18/17; he has had adjustments in his insulin apparently his blood sugars in the morning or over 100. He wants to restart his hyperbaric treatment we'll do this at 1:00. 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 I think there is good reason to complete his treatments in order to give him the best chance of maintaining a healed status  and these DFU 3 wounds with MRSA infection in the bone 02/15/17; the patient was seen today in conjunction with HBO. He completed hyperbaric oxygen today. The open area on his transmetatarsal site has remained closed. There was an area of erythema when I saw him earlier in the week on the posterior heel although that is resolved as of today as well. He has been using a cam walker. This is a patient who came to [place] after a transmetatarsal amputation that was necrotic and dehisced. He required revascularization percutaneously on 2 different occasions by Dr. Andree Elk of invasive cardiology at [facility]. 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 He went to [facility] infectious disease and had IV daptomycin for 5 weeks before developing eosinophilic pneumonitis and requiring an admission to hospital/ICU. He made a good recovery and is continued on doxycycline since. As mentioned his foot is closed now. He has a follow-up with Dr. Andree Elk tomorrow. He is going to [employer] on Monday for a custom-made shoe READMISSION Last visit Dr. Andree Elk [facility] 02/16/17 1. Critical limb ischemia of the RLE:  s/p transmetatarsal amputation of the RLE, now completely healed.  s/p right ATA percutaneous revascularization procedure x2, most recently 11/20/2016 s/p PTA of right AT 100% to less than 20% with a 2.5 x 200 balloon.  He does have some residual osteomyelitis, but given the wound is closed, the orthopedist has recommended to follow. He has a fitting for a special shoe coming up next week. He has completed a course of daptomycin and doxycycline and he has been discharged by ID. He has completed a course of hyperbaric therapy.  Continue Continue medical management with aspirin, Plavix and statin therapy. We will discuss ongoing Plavix therapy at follow-up in 6 months.  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 Given no symptoms, we will conservatively manage. He doesn't want to do any more invasive studies at this time, which is reasonable. The patient arrives today out of 2 concerns both on the right transmetatarsal site. 1 at the level of the reminiscent fifth metatarsal head and the other at roughly the first or second. Both of these look like dark subcutaneous discoloration probably subdermal bleeding. He is recently obtained new adaptive footwear for the right foot. He also has a callus on the left fifth dorsal toe however he follows with podiatry for this and I don't think this is any issue. ABIs in this clinic today were 0.66 on the right and 1.06 on the left. On entrance into our clinic initially this was 0.95 and 0.92. He  does not describe current claudication. They're going away on a cruise in 8 weeks and I think are trying to do the month is much as they can proactively. They follow with podiatry and have an appointment with Dr. Andree Elk in October READMISSION 06/25/18 This is a patient that we have not seen in almost a year. He is a type II diabetic with known PAD. He is followed by Dr. Andree Elk of interventional cardiology at [facility] in [place]. He is required revascularization for significant PAD. When we first saw him he required a transmetatarsal amputation. He had underlying osteomyelitis with a nonhealing surgical wound. This eventually closed with wound care, IV antibiotics and hyperbaric oxygen. They tell me that he has a modified shoe and he is very active walking up to 4 miles a day. He is followed by Dr. Geroge Baseman of podiatry. His wife states that he underwent a removal of callus over this site on July 9. She felt there may be drainage from this site after that although she could never really determined and there was callus buildup again. On 06/01/18 there  was pressure bleeding through the overlying callus. he was given a prescription for 7 days of Bactrim. Fortuitously he has an appointment with Dr. Andree Elk on 06/04/18 and he immediately underwent revascularization of the right leg although I have not had a chance to review these records in care everywhere. This was apparently done through anterior and retrograde access. On 06/06/18 he had another debridement by Dr. Bernette Mayers. Vitamin soaking with Epsom salts for 20 minutes and applying calcium alginate. The original trans-met was on April 2017 I believe by Dr. Doran Durand. His ABI in our clinic was noncompressible today. 07/02/18; x-ray I ordered last week was negative for osteomyelitis. Swab culture was also negative. He is going to require an MRI which I have ordered today. 07/09/18; surprisingly the MRI of the foot that I ordered did not show osteomyelitis. He did suggest the possibility of cellulitis. For this reason I'll go ahead and give him a 10 day course of doxycycline. Although the previous culture of this area was negative 07/16/18; it arrives with the wound looking much the same. Roughly the same depth. He has thick subcutaneous tissue around the wound orifice but this still has roughly the same depth. Been using silver alginate. We applied Oasis #1 today 07/23/2018; still having to remove a lot of callus and thick subcutaneous tissue to actually define the wound here. Most of this seems to have closed down yet he has a comma shaped divot over the top of the area that still I think is open. We applied Oasis #2 His wife expressed concern about the tip of his left great toe. This almost looks like a small blister. She also showed it today to her podiatrist Dr. Geroge Baseman who did not think this was anything serious. I am not sure is anything serious either however I think it bears some watching. He is not in any pain however he is insensate 07/30/2018;; still on a lot of nonviable tissue over the surface of  the wound however cleaning this up reveals a more substantial wound orifice but less of a probing wound depth. This is not probed to bone. There is no evidence of infection The wife is still concerned about a non-open area on the tip of his left great toe. Almost feels like a bony outgrowth. She had previously showed this to podiatry. I do not think this is a blister. A friction area would be  possible although he is really not walking according to his wife. He had a small skin tag on his right buttock but no open wound here either 08/06/2018; we applied a third Oasis last week. Unfortunately there is really no improvement. Still requiring extensive debridement to expose the wound bed from a horizontal slitlike depression. I still have not been able to get this to fill in properly. On the positive side there is now no probable bone from when he first came into the facility. I changed him to silver alginate today after a reasonably aggressive debridement 08/13/2018; once again the patient comes in with skin and subcutaneous tissue closing over the small probing area with the underlying cavity of the wound on the right TMA site. I applied silver alginate to this last week. Prior to that we used Oasis x3 still not able to get this area granulating. He does not have a probing area of the bone which is an improvement from when I spur started working on this and an MRI did not suggest osteomyelitis. I do not see evidence of infection here but I am increasingly concerned about why I cannot get this area to granulate. Each time I debrided this is looks like this is simply a matter of getting granulation to fill in the hole and then getting epithelialization. This does not seem to happen Also I sent him back to see podiatry Dr. Earleen Newport about the what felt to be bony outgrowth on the tip of his left great toe. Apparently after heel he left the clinic last week or the next day he developed a blood blister. He  did see Dr. Earleen Newport. He went on to have a debridement of the medial nail cuticle he now has an open area here as well as some denuded skin. They did an x-ray apparently does have a bony outgrowth or spur but I am not able to look at this. They are using topical antibiotics apparently there was some suggested he use Santyl. Patient's wife was anxious for my opinion of this 08/20/2018; we are able to keep the wound open this time instead of the thick subcutaneous tissue closing over the top of it however unfortunately once again this probes to bone. I do not see any evidence of infection and previous MRI did not show osteomyelitis. I elected to go back to the Oasis to see if we can stimulate some granulation. Last saw his vascular interventional cardiologist Dr. Andree Elk at the beginning of August and he had a repeat procedure. Nevertheless I wonder how much blood flow he has down to this area With regards to the left first toe he is seeing Dr. Earleen Newport next week. They are applying Bactroban to this area. 08/27/2018 ooOnce again he comes in with thick eschar and subcutaneous tissue over the top of the small probing hole. This does not appear to go down to bone but it still has roughly the same depth. I reapplied Oasis today ooOver the left great toe there appears to be more of the wound at the tip of his toe than there was last week. This has an eschar on the surface of it. Will change to Santyl. There is seeing podiatry this afternoon 09/03/2018 ooHe comes in today with the area on the transmetatarsal's site with a fair amount of callus, nonviable tissue over the circumference but it was not closed. I removed all of this as well as some subcutaneous debris and reapplied Oasis. There is no exposed bone ooThe area over  the tip of the left great toe started off as a nodule of uncertain etiology. He has been followed with podiatry. They have been removing part of the medial nail bed. He has nonviable  tissue over the wound he been using Santyl in this area 09/10/18 ooUnfortunately comes in with neither wound area looking improved. The transmetatarsal amputation site once Again has nonviable debris over the surface requiring debridement. Unfortunately underneath this there is nothing that looks viable and this once again goes right down to bone. There is no purulent drainage and no erythema. ooAlso the surgical wound from podiatry on the left first toe has an ischemic-looking eschar over the surface of the tip of the toe I have elected not to attempt candidly debride this ooHis wife as arranged for him to have follow-up noninvasive studies in [place] under the care of Dr. Andree Elk clinic. The question is he has known severe PAD. They had recently seen him in August. He has had revascularizations in both legs within the last 4 or 5 months. He is not complaining of pain and I cannot really get a history of claudication. He has not systemically unwell 09/20/2018 the patient has been to [facility] and been revascularized by Dr. Andree Elk earlier this week. Apparently he was able to open up the anterior tibial artery although I have not actually seen his formal report. He is going for an attempt to revascularize on the left on Monday. He is apparently working with an investigational stent for lower extremity arteries below the knee and he has talked to the patient about placing that on Monday if possible. The patient has been using silver alginate on the transmetatarsal amputation site and Santyl on the left 09/30/2018; patient had his revascularization on the left this apparently included a standard approach as well as a more distal arterial catheterization although I do not have any information on this from Dr. Andree Elk. In fact I do not even see the initial revascularization that he had on the right. I have included the arterial history from Dr. Andree Elk last note however below; ASSESSMENT/PLAN: 1. Hx  of Critical limb ischemia bilateral lower extremities, PAD: -s/p transmetatarsal amputation of the RLE. -s/p right ATA percutaneous revascularization procedure x2, most recently 11/20/2016 s/p PTA of right AT 100% to less than 20% with a 2.5 x 200 balloon. 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 -03/21/2018 s/p PTA of 90% left popliteal artery to <10% with a 5x20 cutting balloon, PTA of 90% left peroneal to <20% with a 3x20 balloon, PTA of 100% left AT to <20% with a 2.5x220 balloon. 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 Will plan on the LLE soon after. Risks/benefits of procedure have been discussed and patient has elected to proceed. We have been using endoform to the right TMA amputation site wound and Santyl to the left great toe 10/07/2018; the area on the tip of his left great toe looked better we have been using Santyl here. We continue to have a very difficult probing hole on the right TMA amputation site we have been using endoform. I went on to use his fifth Oasis today 10/14/2018; the tip of the left great toe continues to look better. We have been using Santyl  here the surface however is healthy and I think we can change to an alginate. The right TMA has not changed. Once again he has no superficial opening there is callus and thick subcutaneous tissue over the orifice once you remove this there is the probing area that we have been dealing with without too much change. There is no palpable bone I have been placing Oasis here and put Oasis #6 in this today after a more vigorous debridement 10/21/18; the left great toe still has necrotic surface requiring debridement. The right TMA site hasn't changed  in view of the thick callus over the wound bed. With removal of this there is still the opening however this does not appear to have the same depth. Again there is no palpable bone. Oasis was replaced 10/28/2018; patient comes in with both wounds looking worse. The area over the first toe tip is now down to bone. The area over the TMA site is deeper and down to bone clearly with a increase in overall wound area. Equally concerning on the right TMA is the complete absence of a pulse this week which is a change. We are not even able to Doppler this. On the right he has a noncompressible ABI greater than 1.4 11/04/2018. Both wounds look somewhat worse. X-rays showed no osteomyelitis of the right foot but on the left there was underlying osteomyelitis in the left great toe distal phalanx. I been on the phone to Dr. Andree Elk surface at [facility] in [place] and they are arranging for another angiogram on the right on Monday. I have him on doxycycline for the osteomyelitis in the left great toe for now. Infectious disease may be necessary 1/17; 2-week hiatus. Patient was admitted to hospital at [facility]. My understanding is he underwent an angioplasty of the right anterior tibial artery and had stents placed in the left anterior artery and the left tibial peroneal trunk. This was done by Dr. Andree Elk of interventional radiology. There is no major change in either 1 of the wounds. They have been using Aquacel Ag. As far as they are aware no imaging studies were done of the foot which is indeed unfortunate. I had him on doxycycline for 2 weeks since we identified the osteomyelitis in the left great toe by plain x-ray. I have renewed that again today. I am still suspicious about osteomyelitis in the amputation site and would consider doing another MRI to compare with the one done in September. The idea of hyperbaric oxygen certainly comes up for discussion 1/24; no major change in either wound area. I have him on  doxycycline for osteomyelitis at the tip of the left great toe. As noted he has been previously and recently revascularized by Dr. Andree Elk at [facility]. He is tolerating the doxycycline well. For some reason we do not have an infectious disease consult yet. Culture of drainage from the right foot site last week was negative 1/31; MRI of the right foot did not show osteomyelitis of the right ankle and foot. Notable for a skin ulceration overlying the second metatarsal stump with generalizing soft tissue edema of the ankle and foot consistent with cellulitis. Noted to have a partial-thickness tear of the Achilles tendon 7.5 cm proximal to the insertion Nothing really new in terms of symptoms. Patient's area on the tip of the left great toe is just about closed although he still has the probing area on the metatarsal amputation site. Appointment with Dr. Linus Salmons of infectious disease next week 2/7; Dr.  Cromer did not feel that any further antibiotics were necessary he would follow-up in 2 months. The left great toe appears to be closed still some surface callus that I gently looked under high did not see anything open or anything that was threatening to be open. He still has the open area on the mid part of his TMA site using endoform 2/14; left great toe is closed and he is completing his doxycycline as of last Sunday. He is not on any antibiotics. Unfortunately out of the right foot his wife noticed some subdermal hemorrhage this week. He had been walking 2 miles I had given him permission to do so this is not really a plantar wound. Using endoform to this wound but I changed to silver alginate this week 2/21; left great toe remains closed. Culture last week grew Streptococcus angiosis which I am not really familiar with however it is penicillin sensitive and I am going to put him on Augmentin. Not much change in the wound on the right foot the deep area is still probing precariously close to bone and the  wound on the margin of the TMA is larger. 2/28; left great toe remains closed. He is completing the Augmentin I gave him last week. 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 This is being shown to Dr. Andree Elk at [place] to see if there is anything else that can be done here. He has been using silver alginate strips on the right 3/6; left great toe remains closed. He sees Dr. Andree Elk on Monday. The area on the plantar aspect of the right foot has the same small orifice with thick callused tissue around this. However this time with removal of the callus tissue the wound is open all the way along the incision line to the end medially. We have been using silver alginate. 3/13; left toe remains closed although the area is callused. He sees Dr. Jacqualyn Posey of podiatry next week. The area on the plantar right foot looked a lot better this week. Culture I did of this was negative we use silver alginate. He is going next week for an attempt at revascularization by Dr. Andree Elk 3/23; left toe remains closed although the area is callused. He will see Dr. Jacqualyn Posey in follow-up. The area on the right plantar foot continues to look surprisingly better over the last 3 visits. We have been using silver alginate. The revascularization he was supposed to have by Dr. Andree Elk at [facility] in [place] has been canceled [facility] procedure] 4/6; the right foot remains closed albeit callused. Podiatry canceled the appointment with regards to the left great toe. He has not seen Dr. Andree Elk at [facility] but thinks that Dr. Andree Elk has "done all he can do". He would be a candidate for the hemostaemix trial Readmission 07/29/2019 Mr. Stann Mainland is a man we know well from at least 3 previous stays in this clinic. He is a type II diabetic with severe PAD followed by Dr. Andree Elk at [facility] in [place]. During his last stay here he had a probing wound bone in his right TMA site and a episode of osteomyelitis  on the tip of the left great toe at a surgical site. So far everything in both of these areas has remained closed. About 2 weeks ago he went to see his podiatrist at [facility] Dr. Babs Bertin. He had a thick callus on the left fifth metatarsal head that was shaved. He has developed an open  wound in this area. They have been offloading this in his diabetic shoes. The patient has not had any more revascularizations by Dr. Andree Elk since the last time he was here. ABI in our clinic at the posterior tibial on the left was 1.06 10/6; no real change in the area on the plantar met head. Small wound with 2 mm of depth. He has thick skin probably from pressure around the wound. We have been using silver alginate 10/13; small wound in the left fifth plantar met head. Arrives today with undermining laterally and purulent drainage. Our intake nurse cultured this. His wife stated they noticed a change in color over the last day or 2. He is not systemically unwell 10/19; small wound on the fifth plantar metatarsal head. Culture I did last week showed Staphylococcus lugdunensis. Although this could be a skin contaminant the possibility of a skin and soft tissue infection was there. I did give him empiric doxycycline which should have covered this. We are using silver alginate to the wound. We put him in a total contact cast today. 10/22; small wound on the fifth plantar metatarsal head. He has completed antibiotics. He also has severe PAD which worries me about just about any wound on this man's foot 10/29; small superficial area on the fifth plantar metatarsal head. Measuring slightly smaller. He is not currently on any antibiotics. I been using a total contact cast in this man with very severe PAD but he seems to be tolerating this well 11/5; left plantar fifth metatarsal head. Silver alginate being used under a total contact cast 11/12; wound not much different than last week. Using a #15 scalpel  debridement around the wound. Still silver collagen under a total contact cast. He has severe PAD and that may be playing a role in this 11/19; disappointing that the wound is not really changed that much. I think it is come down in overall surface area because originally this was on the lateral part of the fifth metatarsal head however recently it is not really changed. Most of this is filled in but it will not epithelialized there is surface debris on this which may be mostly related to ischemia. They have an appointment with Dr. Andree Elk on 12/9 09/24/2019 on evaluation today patient appears to be doing somewhat better with regard to the wound on the left fifth metatarsal head. Fortunately there does not appear to be any signs of active infection at this time. No fevers, chills, nausea, vomiting, or diarrhea. The wound does not appear to be completely closed and I do feel like the Hydrofera Blue is helping to some degree although I feel like the cast as well as keeping things from breaking down or getting any larger at least. As far as his wound overview it shows that the overall size of the wound is slightly smaller but really maintaining within the realm of about the same. He had no troubles with the cast which is good news. 12/1; left fifth metatarsal head. Perhaps somewhat more vibrant. We have been using Hydrofera Blue. He sees Dr. Andree Elk at [facility] 1 week tomorrow. He be back next week and I hope to have a better idea which way the wound is going however I will not cast him next week in case Dr. Andree Elk wants to reevaluate things in his foot. The left leg is the leg with the stent in place and I wonder whether these are going to have to be reevaluated. 12/8; left fifth metatarsal head. Quite  a bit better this week. Only a small open area remains. He sees Dr. Andree Elk at [facility] tomorrow. Dr. Andree Elk is previously done his vascular interventions. He has 2 stents in the left leg as I remember things. I  therefore will not put him in a total contact cast today. He will be using a forefoot off loader. We will use silver collagen on the wound. We will see him again next week 12/15; left fifth metatarsal head. He saw Dr. Andree Elk and is scheduled for an angiogram on Thursday. Unfortunately although his wound was a lot better last week it is really deteriorated this week. Small punched-out hole with depth and overhanging tissue. We have been using Hydrofera Blue 12/22; patient had an 80% stenosis proximal to the stent I believe in the below-knee popliteal artery. This was opened by Dr. Andree Elk. According the patient the stent itself was satisfactory. I have not been able to review this note. 12/29; patient arrives today with the wound about the same size some undermining medially. We have been using Hydrofera Blue under a total contact cast and that is what we will do again today 11/04/2019. Slightly smaller in orifice but about 4 mm undermining almost circumferentially. We have been using Hydrofera Blue. Apparently the Hydrofera Blue did not stay on the wound after we removed the cast. Some minor looking cast irritation on the right lateral 1/12; unfortunately things did not go well this week. Arrives in clinic today with a deeper wound with undermining more concerning a area of pus. Specimen was obtained for culture. We are not going to be able to put him in a cast today. 1/19; purulent material last week which I cultured showed Enterobacter cloacae. He was on ampicillin previously prescribed by his primary doctor which should not have covered this however mysteriously the wound looks somewhat better. There is less depth less erythema certainly no drainage. He will need a course of ciprofloxacin which I will provide today. 1/26; he has completed the ciprofloxacin. I don't think he needs any additional antibiotics. The areas on the left fifth plantar met head. We've been using silver alginate 2/2; comes  in today with 0.4 cm of circumferential undermining around a small wound in this area. We have been using silver alginate with a forefoot offloading boot 2/9; again the wound appears larger nonviable surface and with 0.4 cm roughly of circumferential undermining. We have been using silver alginate with a forefoot offloading boot. The wound does not look infected however his wife is quick to point out about swelling on the dorsal foot. 2/16; not much change. Comes in with a small wound with a nonviable necrotic surface. Again marked undermining that I had totally removed last week. He had a arterial Doppler last week in Dr. Andree Elk office at [facility]. They have not heard these results. They are somewhat frustrated. Objective Constitutional Sitting or standing Blood Pressure is within target range for patient.. Pulse regular and within target range for patient.Marland Kitchen Respirations regular, non-labored and within target range.. Temperature is normal and within the target range for the patient.Marland Kitchen Appears in no distress. Vitals Time Taken: 12:42 PM, Height: 69 in, Weight: 210 lbs, BMI: 31, Temperature: 98.5 F, Pulse: 76 bpm, Respiratory Rate: 18 breaths/min, Blood Pressure: 102/50 mmHg, Capillary Blood Glucose: 106 mg/dl. General Notes: Wound exam; no purulent drainage but a fibrinous surface completely nonviable again with circumferential undermining of almost 0.5 cm. I debrided the area gently with a #3 curette there is bleeding but the tissue  generally looks pale. Integumentary (Hair, Skin) Wound #5 status is Open. Original cause of wound was Gradually Appeared. The wound is located on the Left Metatarsal head fifth. The wound measures 0.5cm length x 0.6cm width x 0.2cm depth; 0.236cm^2 area and 0.047cm^3 volume. There is Fat Layer (Subcutaneous Tissue) Exposed exposed. There is no tunneling noted, however, there is undermining starting at 7:00 and ending at 10:00 with a maximum distance of 0.6cm.  There is a small amount of serosanguineous drainage noted. The wound margin is well defined and not attached to the wound base. There is medium (34-66%) pink, pale, friable granulation within the wound bed. There is a medium (34-66%) amount of necrotic tissue within the wound bed including Adherent Slough. Assessment Active Problems ICD-10 Type 2 diabetes mellitus with foot ulcer Type 2 diabetes mellitus with diabetic peripheral angiopathy without gangrene Non-pressure chronic ulcer of other part of left foot limited to breakdown of skin Cellulitis of left lower limb Procedures Wound #5 Pre-procedure diagnosis of Wound #5 is a Diabetic Wound/Ulcer of the Lower Extremity located on the Left Metatarsal head fifth .Severity of Tissue Pre Debridement is: Fat layer exposed. There was a Excisional Skin/Subcutaneous Tissue Debridement with a total area of 0.3 sq cm performed by Ricard Dillon., MD. With the following instrument(s): Curette to remove Viable and Non-Viable tissue/material. Material removed includes Subcutaneous Tissue, Slough, Skin: Dermis, and Skin: Epidermis after achieving pain control using Other (benzocaine 20%). No specimens were taken. A time out was conducted at 13:14, prior to the start of the procedure. A Moderate amount of bleeding was controlled with Pressure. The procedure was tolerated well with a pain level of 0 throughout and a pain level of 0 following the procedure. Post Debridement Measurements: 0.5cm length x 0.6cm width x 0.2cm depth; 0.047cm^3 volume. Character of Wound/Ulcer Post Debridement is improved. Severity of Tissue Post Debridement is: Fat layer exposed. Post procedure Diagnosis Wound #5: Same as Pre-Procedure Plan Follow-up Appointments: Return Appointment in 1 week. Dressing Change Frequency: Wound #5 Left Metatarsal head fifth: Change Dressing every other day. Wound Cleansing: Wound #5 Left Metatarsal head fifth: May shower and wash wound  with soap and water. Primary Wound Dressing: Wound #5 Left Metatarsal head fifth: Polymem Silver Other: - pad right great toe for protection Secondary Dressing: Kerlix/Rolled Gauze Dry Gauze Off-Loading: Other: - Fore foot off loader 1. Fifth metatarsal head 2. I continued with the PolyMem dressings 3. Awaiting comment from Dr. Andree Elk office about vascular supply/revascularization 4. I would like to make sure that we have vascular's final opinion before we consider reapplying total contact cast. I also had some thoughts about advanced treatment products Electronic Signature(s) Signed: 12/16/2019 5:18:57 PM By: [name] MD Entered By: [name] on 12/16/2019 13:40:33 -------------------------------------------------------------------------------- SuperBill Details Patient Name: Date of Service: DJON, TITH 12/16/2019 Medical Record [id number] Patient Account Number: [id number] Date of Birth/Sex: Treating RN: [id number] (73 y.o. [name]) [name] Primary Care Provider: Rory Guzman Other Clinician: Referring Provider: Treating Provider/Extender:Deric Bocock, [name], [name] in Treatment: 20 Diagnosis Coding ICD-10 Codes Code Description E11.621 Type 2 diabetes mellitus with foot ulcer E11.51 Type 2 diabetes mellitus with diabetic peripheral angiopathy without gangrene L97.521 Non-pressure chronic ulcer of other part of left foot limited to breakdown of skin L03.116 Cellulitis of left lower limb Facility Procedures CPT4 Code Description: [id number] - DEB SUBQ TISSUE 20 SQ CM/< ICD-10 Diagnosis Description L97.521 Non-pressure chronic ulcer of other part of left foot limited E11.621 Type 2 diabetes mellitus with foot  ulcer Modifier: to breakdown o Quantity: 1 f skin Physician Procedures CPT4 Code Description: [id number] - WC PHYS SUBQ TISS 20 SQ CM ICD-10 Diagnosis Description L97.521 Non-pressure chronic ulcer of other part of left foot lim  E11.621 Type 2 diabetes mellitus with foot ulcer Modifier: ited to breakdo Quantity: 1 wn of skin Electronic Signature(s) Signed: 12/16/2019 5:18:57 PM By: [name] MD Entered By: [name] on 12/16/2019 13:40:52

## 2019-12-23 ENCOUNTER — Other Ambulatory Visit: Payer: Self-pay

## 2019-12-23 ENCOUNTER — Encounter (HOSPITAL_BASED_OUTPATIENT_CLINIC_OR_DEPARTMENT_OTHER): Payer: Medicare Other | Admitting: Internal Medicine

## 2019-12-23 DIAGNOSIS — E11621 Type 2 diabetes mellitus with foot ulcer: Secondary | ICD-10-CM | POA: Diagnosis not present

## 2019-12-23 NOTE — Progress Notes (Signed)
[name], [name] ([id number]) Visit Report for 12/23/2019 HPI Details Patient Name: Date of Service: [name], [name] 12/23/2019 12:30 PM Medical Record [id number] Patient Account Number: [id number] Date of Birth/Sex: Treating RN: October 29, 1946 (74 y.o. [name]) [name] Primary Care Provider: [name] Other Clinician: Referring Provider: Treating Provider/Extender:Simcha Speir, [name], [name] in Treatment: 21 History of Present Illness HPI Description: 05/18/16; this is a 74year-old diabetic who is a type II diabetic on insulin. The history is that he traumatized his right foot developed a sore sometime in late March. Shortly thereafter he went on a cruise but he had to get off the cruise ship in [place] and fly urgently back to [place] where he was admitted to [facility] and ultimately underwent a transmetatarsal amputation by Dr. 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 According to the patient and his wife this wound never really healed. He was seen on 2 occasions in the wound care center in [place] and had vascular studies and then was referred urgently to Dr. Bridgett Larsson of vascular surgery. He underwent an angiogram on 05/10/16. Unfortunately nothing really could be done to improve his vascular status. He had a 75-90% stenosis in the midsegment of 1 segment of the posterior femoral artery. He had a patent popliteal, his anterior tibial occluded shortly after takeoff. Perineal had a greater than 90% stenosis posterior tibial is occluded feet had no distal collaterals feed distal aspect of the transmetatarsal amputation site. The patient tells me that he had a prolonged period of Santyl by Dr. Doran Durand was some initial improvement but then this was stopped. I think they're only applying daily dressings/dry dressings. He has not had a recent x-ray of the right foot he did have one before his surgery in April. His wife by the dimensions of the wound/surgical site being followed at  home since 4/20. At that point the dimensions were 0.5 x 12 x 0.2 on 7/19 this was 1.8 x 6 x 0.4. He is not currently on any antibiotics. His hemoglobin A1c in early April was 12.9 at that point he was started on insulin. Apparently his blood sugars are much lower he has an appointment with Dr. Legrand Como Alteimer of endocrine next week. 05/29/16 x-ray of the area did not show osteomyelitis. I think he probably needs an MRI at this point. His wife is asking about something called"Yireh" cream which is not FDA approved. I have not heard of this. 06/22/16; MRI did not really suggest osteomyelitis. There was minimal marrow edema and enhancement in the stump of the second metatarsal felt to be secondary likely to postoperative change rather than osteomyelitis. The patient has arranged his own consultation with Dr. Andree Elk at [facility], apparently their daughter lives in [place] and has some connection here. Any improvement in vascular supply by Dr. Andree Elk would of course be helpful. Dr. Bridgett Larsson did not feel that anything further could be done other than amputation if wound care did not result in healing or if the area deteriorates. 06/26/16; the patient has been to see Dr. Andree Elk at [place] and had an angiogram. He is going for a procedure on Thursday which will involve catheterization. I'm not sure if this is an anterograde or retrograde approach. He has been using Santyl to the wound 07/10/16; the patient had a repeat angiogram and angioplasty at [place] by Dr. Brunetta Jeans. His angiogram showed right CFA and profundal widely patent. The right as of a.m. popliteal artery were widely patent the right anterior tibial was occluded proximally and reconstitutes at  the ankle. Peroneal artery was patent to the foot. Posterior tibial artery was occluded. The patient had angioplasty of the anterior tibial artery.. This was quite successful. He was recommended for Plavix as well as aspirin. 07/17/16; the patient was close to be a nurse  visit today however the outer dressing of the Apligraf fell off. Noted drainage. I was asked to see the wound. The patient is noted an odor however his wife had noted that. Drainage with Apligraf not necessarily a bad thing. He has not been systemically unwell 07/24/16; we are still have an issue with drainage of this wound. In spite of this I applied his second Apligraf. Medially the area still is probing to bone. 08/07/16; Apligraf reapplied in general wound looks improved. 08/21/16 Apligraf #4. Wound looks much better 09/04/16 patientt's wound again today continues to appear to improve with the application of the Apligraf's. He notes no increased discomfort or concerns at this point in time. 09/18/16; the patient returns today 2 weeks after his fifth application of Apligraf. Predictably three quarters of the width of this wound has healed. The deep area that probe to bone medially is still open. The patient asked how much out-of-pocket dollars would be for additional Apligraf's. 09/25/16; now using Hydrofera Blue. He has completed 5 Apligraf applications with considerable improvement in this deep open transmetatarsal amputation site. His wound is now a triangular-shaped wound on the medial aspect. At roughly 12 to 2:00 this probes another centimeter but as opposed to in the past this does not probe to bone. The patient has been seen at [place] by Dr. Zenia Resides. He is not planning to do any more revascularization unless the wound stalls or worsens per the patient 10/02/16; 0.7 x 0.8 x 0.8. Unfortunately although the wound looks stable to improved. There is now easily probable bone. This hasn't been present for several weeks. Patient is not otherwise symptomatic he is not experiencing any pain. I did a culture of the wound bed 10/09/16. Deterioration last week. Culture grew MRSA and although there is improvement here with doxycycline prescribed over the phone I'm going to try to get him linezolid 600 twice  a day for 10 days today. 10/16/16; he is completing a weeks worth of linezolid and still has 3 more days to go. Small triangular-shaped open area with some degree of undermining. There is still palpable bone with a curet. Overall the area appears better than last week 10/20/16 he has completed the linezolid still having some nausea and vomiting but no diarrhea. He has exposed bone this week which is a deterioration. 10/27/16 patient now has a small but probing wound down to bone. Culture of this bone that I did last week showed a few methicillin-resistant staph aureus. I have little doubt that this represents acute/subacute osteomyelitis. The patient is currently on Doxy which I will continue he also completed 10 days of linezolid. We are now in a difficult situation with this patient's foot after considerable discussion we will send him back to see Dr. Doran Durand for a surgical opinion of this I'm also going to try to arrange a infectious disease consult at [facility] hopefully week and get this prior to her usual 4-6 weeks we having [place]. The patient clearly is going to need 6 weeks of IV vancomycin. If we cannot arrange this expediently I'll have to consider ordering this myself through a home infusion company 11/03/16; the patient now has a small in terms of circumference but probing wound. No bone palpable today. The  patient remains on doxycycline 100 twice a day which should support him until he sees infectious disease at [facility] next week the following week on Wednesday I believe he has an appointment with Dr. Andree Elk at [facility] who is his vascular cardiologist. Finally he has an appointment with Dr. Doran Durand on 11/22/16 we have been using silver alginate. The patient's wife states they are having trouble getting this through [facility] 11/13/16; the patient was seen by infectious disease at [facility] in the 11th PICC line placed in preparation for IV antibiotics. A tummy he has not going to get IV  vancomycin o Ceftaroline. They've also ordered an MRI. Patient has a follow-up with Dr. Doran Durand on 11/22/16 and Dr. Andree Elk at [facility] tomorrow 11/23/16 the patient is on daptomycin as directed by infectious disease at [facility]. He is also been back to see Dr. Andree Elk at [facility]. He underwent a repeat arteriogram. He had a successful PTA of the right anterior tibial artery. He is on dual antiplatelete treatment with Plavix and aspirin. Finally he had the MRI of his foot in [place]. This showed cellulitis about the foot worse distally edema and enhancement in the reminiscent of the second metatarsal was consistent with osteomyelitis therefore what I was assuming to be the first metatarsal may be actually the second. He also has a fluid collection deep to the calcaneus at the level of the calcaneal spur which could be an abscess or due to adventitial bursitis. He had a small tear in his Achilles 11/30/16; the patient continues on daptomycin as directed by infectious disease at [facility]. He is been revascularized by Dr. Andree Elk at [facility] in [place]. He has been to see Gretta Arab who was the orthopedic surgeon who did his original amputation. I have not seen his not however per the patient's wife he did not offer another surgical local surgical prodecure to remove involved bone. He verbalized his usual disbelief in not just hyperbarics but any medical therapy for this condition(osteomyelitis). I discussed this in detail with the patient today including answering the question about a BKA definitively "curing" the current condition. 12/07/16; the patient continues on daptomycin as directed by infectious disease at [facility]. This is directed at the MRSA that we cultured from his bone debridement from 12/22. Lab work today shows a white count of 8.7 hemoglobin of 10.5 which is microcytic and hypochromic differential count shows a slightly elevated monocyte count at 1.2 eosinophilic count of 0.6. His  creatinine is 1.11 sedimentation rate apparently is gone from 35-34 now 40. I explained was wife I don't think this represents a trend. His total CK is 48 12/14/16- patient is here for follow-up evaluation of his right TMA site. He continues to receive IV daptomycin per infectious disease. His serum inflammatory markers remain elevated. He complains of intermittent pain to the medial aspect of the TMA site with intermittent erythema. He voices no complaints or concerns regarding hyperbaric therapy. Overall he and his wife are expressing a frustration and discouragement regarrding the length of time of treatment. 12/21/16; small open wound at roughly the first or second metatarsal metatarsalphalyngeal joint reminiscence of his transmetatarsal amputation site he continues to receive IV daptomycin per infectious disease at [facility]. He will finish these a week tomorrow. He has lab work which I been copied on. His white count is 10.8 hemoglobin 8.9 MCV is low at 75., MCH low at 24.5 platelet count slightly elevated at 626. Differential count shows 70% neutrophils 10% monocytes and 10% eosinophils. His  comprehensive metabolic panel shows a slightly low sodium at 133 albumin low at 3.1 total CK is normal at 42 sedimentation rate is much higher at 82. He has had iron studies that show a serum iron of 15 and iron binding capacity of 238 and iron saturation of 6. This is suggestive of iron deficiency. B12 and folate were normal ferritin at 113 The patient tells me that he is not eating well and he has lost weight. He feels episodically nauseated. He is coughing and gagging on mucus which she thinks is sinusitis. He has an appointment with his primary doctor at 5:00 this afternoon in [facility] 01/02/17; the patient developed a subacute pneumonitis. He was admitted to [facility] after a CT scan showed an extensive interstitial pneumonitis [I have not yet seen this]. 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 He has since been discharged. He is not on oxygen. He feels fatigued and very short of breath with exertion. At [facility] the wound care nurse there felt that his wound was healed. He did complete his daptomycin and has follow-up with infectious disease on Friday. X-rays I did before he went to [facility] still suggested residual osteomyelitis in the anterior aspect of the second must metatarsal head. I'm not sure I would've expected any different. There was no other findings. I actually think I did this because of erythema over the first metatarsal head reminiscent 01/11/17; the patient has eosinophilic pneumonitis. He has been reviewed by pulmonology and given clearance for hyperbarics at least that's what his wife says. I'll need to see if there is note in care everywhere. Apparently the prognosis for improvement of daptomycin induced eosinophilic granulocyte is is 3 months without steroids. In the meantime infectious disease has placed him on doxycycline until the wound is closed. He is still is lost a lot of weight and his blood sugars are running in the mid 60s to low 80s fasting and at all times during the day 01/18/17; he has had adjustments in his insulin apparently his blood sugars in the morning or over 100. He wants to restart his hyperbaric treatment we'll do this at 1:00. 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 I think there is good reason to complete his treatments in order to give him the best chance of maintaining a healed status and these DFU 3 wounds with MRSA infection in the bone 02/15/17; the patient was seen today in conjunction with HBO. He completed hyperbaric oxygen today. The open area on his transmetatarsal site has remained closed. There was an area of erythema when I saw him  earlier in the week on the posterior heel although that is resolved as of today as well. He has been using a cam walker. This is a patient who came to [place] after a transmetatarsal amputation that was necrotic and dehisced. He required revascularization percutaneously on 2 different occasions by Dr. Andree Elk of invasive cardiology at [facility]. 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 He went to [facility] infectious disease and had IV daptomycin for 5 weeks before developing eosinophilic pneumonitis and requiring an admission to hospital/ICU. He made a good recovery and is continued on doxycycline since. As mentioned his foot is closed now. He has a follow-up with Dr. Andree Elk tomorrow. He is going  to [employer] on Monday for a custom-made shoe READMISSION Last visit Dr. Andree Elk [facility] 02/16/17 1. Critical limb ischemia of the RLE: s/p transmetatarsal amputation of the RLE, now completely healed. s/p right ATA percutaneous revascularization procedure x2, most recently 11/20/2016 s/p PTA of right AT 100% to less than 20% with a 2.5 x 200 balloon. He does have some residual osteomyelitis, but given the wound is closed, the orthopedist has recommended to follow. He has a fitting for a special shoe coming up next week. He has completed a course of daptomycin and doxycycline and he has been discharged by ID. He has completed a course of hyperbaric therapy. Continue Continue medical management with aspirin, Plavix and statin therapy. We will discuss ongoing Plavix therapy at follow-up in 6 months. 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 Given no symptoms, we will conservatively manage. He doesn't want to do any more invasive studies at this time, which is reasonable. The patient  arrives today out of 2 concerns both on the right transmetatarsal site. 1 at the level of the reminiscent fifth metatarsal head and the other at roughly the first or second. Both of these look like dark subcutaneous discoloration probably subdermal bleeding. He is recently obtained new adaptive footwear for the right foot. He also has a callus on the left fifth dorsal toe however he follows with podiatry for this and I don't think this is any issue. ABIs in this clinic today were 0.66 on the right and 1.06 on the left. On entrance into our clinic initially this was 0.95 and 0.92. He does not describe current claudication. They're going away on a cruise in 8 weeks and I think are trying to do the month is much as they can proactively. They follow with podiatry and have an appointment with Dr. Andree Elk in October READMISSION 06/25/18 This is a patient that we have not seen in almost a year. He is a type II diabetic with known PAD. He is followed by Dr. Andree Elk of interventional cardiology at [facility] in [place]. He is required revascularization for significant PAD. When we first saw him he required a transmetatarsal amputation. He had underlying osteomyelitis with a nonhealing surgical wound. This eventually closed with wound care, IV antibiotics and hyperbaric oxygen. They tell me that he has a modified shoe and he is very active walking up to 4 miles a day. He is followed by Dr. Geroge Baseman of podiatry. His wife states that he underwent a removal of callus over this site on July 9. She felt there may be drainage from this site after that although she could never really determined and there was callus buildup again. On 06/01/18 there was pressure bleeding through the overlying callus. he was given a prescription for 7 days of Bactrim. Fortuitously he has an appointment with Dr. Andree Elk on 06/04/18 and he immediately underwent revascularization of the right leg although I have not had a chance to review these  records in care everywhere. This was apparently done through anterior and retrograde access. On 06/06/18 he had another debridement by Dr. Bernette Mayers. Vitamin soaking with Epsom salts for 20 minutes and applying calcium alginate. The original trans-met was on April 2017 I believe by Dr. Doran Durand. His ABI in our clinic was noncompressible today. 07/02/18; x-ray I ordered last week was negative for osteomyelitis. Swab culture was also negative. He is going to  require an MRI which I have ordered today. 07/09/18; surprisingly the MRI of the foot that I ordered did not show osteomyelitis. He did suggest the possibility of cellulitis. For this reason I'll go ahead and give him a 10 day course of doxycycline. Although the previous culture of this area was negative 07/16/18; it arrives with the wound looking much the same. Roughly the same depth. He has thick subcutaneous tissue around the wound orifice but this still has roughly the same depth. Been using silver alginate. We applied Oasis #1 today 07/23/2018; still having to remove a lot of callus and thick subcutaneous tissue to actually define the wound here. Most of this seems to have closed down yet he has a comma shaped divot over the top of the area that still I think is open. We applied Oasis #2 His wife expressed concern about the tip of his left great toe. This almost looks like a small blister. She also showed it today to her podiatrist Dr. Geroge Baseman who did not think this was anything serious. I am not sure is anything serious either however I think it bears some watching. He is not in any pain however he is insensate 07/30/2018;; still on a lot of nonviable tissue over the surface of the wound however cleaning this up reveals a more substantial wound orifice but less of a probing wound depth. This is not probed to bone. There is no evidence of infection The wife is still concerned about a non-open area on the tip of his left great toe. Almost feels like a  bony outgrowth. She had previously showed this to podiatry. I do not think this is a blister. A friction area would be possible although he is really not walking according to his wife. He had a small skin tag on his right buttock but no open wound here either 08/06/2018; we applied a third Oasis last week. Unfortunately there is really no improvement. Still requiring extensive debridement to expose the wound bed from a horizontal slitlike depression. I still have not been able to get this to fill in properly. On the positive side there is now no probable bone from when he first came into the facility. I changed him to silver alginate today after a reasonably aggressive debridement 08/13/2018; once again the patient comes in with skin and subcutaneous tissue closing over the small probing area with the underlying cavity of the wound on the right TMA site. I applied silver alginate to this last week. Prior to that we used Oasis x3 still not able to get this area granulating. He does not have a probing area of the bone which is an improvement from when I spur started working on this and an MRI did not suggest osteomyelitis. I do not see evidence of infection here but I am increasingly concerned about why I cannot get this area to granulate. Each time I debrided this is looks like this is simply a matter of getting granulation to fill in the hole and then getting epithelialization. This does not seem to happen Also I sent him back to see podiatry Dr. Earleen Newport about the what felt to be bony outgrowth on the tip of his left great toe. Apparently after heel he left the clinic last week or the next day he developed a blood blister. He did see Dr. Earleen Newport. He went on to have a debridement of the medial nail cuticle he now has an open area here as well as some denuded skin. They  did an x-ray apparently does have a bony outgrowth or spur but I am not able to look at this. They are using topical antibiotics  apparently there was some suggested he use Santyl. Patient's wife was anxious for my opinion of this 08/20/2018; we are able to keep the wound open this time instead of the thick subcutaneous tissue closing over the top of it however unfortunately once again this probes to bone. I do not see any evidence of infection and previous MRI did not show osteomyelitis. I elected to go back to the Oasis to see if we can stimulate some granulation. Last saw his vascular interventional cardiologist Dr. Andree Elk at the beginning of August and he had a repeat procedure. Nevertheless I wonder how much blood flow he has down to this area With regards to the left first toe he is seeing Dr. Earleen Newport next week. They are applying Bactroban to this area. 08/27/2018 Once again he comes in with thick eschar and subcutaneous tissue over the top of the small probing hole. This does not appear to go down to bone but it still has roughly the same depth. I reapplied Oasis today Over the left great toe there appears to be more of the wound at the tip of his toe than there was last week. This has an eschar on the surface of it. Will change to Santyl. There is seeing podiatry this afternoon 09/03/2018 He comes in today with the area on the transmetatarsal's site with a fair amount of callus, nonviable tissue over the circumference but it was not closed. I removed all of this as well as some subcutaneous debris and reapplied Oasis. There is no exposed bone The area over the tip of the left great toe started off as a nodule of uncertain etiology. He has been followed with podiatry. They have been removing part of the medial nail bed. He has nonviable tissue over the wound he been using Santyl in this area 09/10/18 Unfortunately comes in with neither wound area looking improved. The transmetatarsal amputation site once Again has nonviable debris over the surface requiring debridement. Unfortunately underneath this there is  nothing that looks viable and this once again goes right down to bone. There is no purulent drainage and no erythema. Also the surgical wound from podiatry on the left first toe has an ischemic-looking eschar over the surface of the tip of the toe I have elected not to attempt candidly debride this His wife as arranged for him to have follow-up noninvasive studies in [place] under the care of Dr. Andree Elk clinic. The question is he has known severe PAD. They had recently seen him in August. He has had revascularizations in both legs within the last 4 or 5 months. He is not complaining of pain and I cannot really get a history of claudication. He has not systemically unwell 09/20/2018 the patient has been to [facility] and been revascularized by Dr. Andree Elk earlier this week. Apparently he was able to open up the anterior tibial artery although I have not actually seen his formal report. He is going for an attempt to revascularize on the left on Monday. He is apparently working with an investigational stent for lower extremity arteries below the knee and he has talked to the patient about placing that on Monday if possible. The patient has been using silver alginate on the transmetatarsal amputation site and Santyl on the left 09/30/2018; patient had his revascularization on the left this apparently included a standard approach  as well as a more distal arterial catheterization although I do not have any information on this from Dr. Andree Elk. In fact I do not even see the initial revascularization that he had on the right. I have included the arterial history from Dr. Andree Elk last note however below; ASSESSMENT/PLAN: 1. Hx of Critical limb ischemia bilateral lower extremities, PAD: -s/p transmetatarsal amputation of the RLE. -s/p right ATA percutaneous revascularization procedure x2, most recently 11/20/2016 s/p PTA of right AT 100% to less than 20% with a 2.5 x 200 balloon. -On original angiogram  05/15/2016, he had a significant 70-95% left popliteal artery stenosis with AT and PT artery occlusions and one vessel runoff via the peroneal artery. -03/21/2018 s/p PTA of 90% left popliteal artery to <10% with a 5x20 cutting balloon, PTA of 90% left peroneal to <20% with a 3x20 balloon, PTA of 100% left AT to <20% with a 2.5x220 balloon. 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 Will plan on the LLE soon after. Risks/benefits of procedure have been discussed and patient has elected to proceed. We have been using endoform to the right TMA amputation site wound and Santyl to the left great toe 10/07/2018; the area on the tip of his left great toe looked better we have been using Santyl here. We continue to have a very difficult probing hole on the right TMA amputation site we have been using endoform. I went on to use his fifth Oasis today 10/14/2018; the tip of the left great toe continues to look better. We have been using Santyl here the surface however is healthy and I think we can change to an alginate. The right TMA has not changed. Once again he has no superficial opening there is callus and thick subcutaneous tissue over the orifice once you remove this there is the probing area that we have been dealing with without too much change. There is no palpable bone I have been placing Oasis here and put Oasis #6 in this today after a more vigorous debridement 10/21/18; the left great toe still has necrotic surface requiring debridement. The right TMA site hasn't changed in view of the thick callus over the wound bed. With removal of this there is still the opening however this does not appear to have the same depth. Again there is no palpable bone. Oasis was replaced 10/28/2018; patient comes in with both wounds looking worse. The area over the  first toe tip is now down to bone. The area over the TMA site is deeper and down to bone clearly with a increase in overall wound area. Equally concerning on the right TMA is the complete absence of a pulse this week which is a change. We are not even able to Doppler this. On the right he has a noncompressible ABI greater than 1.4 11/04/2018. Both wounds look somewhat worse. X-rays showed no osteomyelitis of the right foot but on the left there was underlying osteomyelitis in the left great toe distal phalanx. I been on the phone to Dr. Andree Elk surface at [facility] in [place] and they are arranging for another angiogram on the right on Monday. I have him on doxycycline for the osteomyelitis in the left great toe for now. Infectious disease may be necessary 1/17; 2-week hiatus. Patient was admitted to hospital at [place]. My understanding is he underwent an angioplasty of  the right anterior tibial artery and had stents placed in the left anterior artery and the left tibial peroneal trunk. This was done by Dr. Andree Elk of interventional radiology. There is no major change in either 1 of the wounds. They have been using Aquacel Ag. As far as they are aware no imaging studies were done of the foot which is indeed unfortunate. I had him on doxycycline for 2 weeks since we identified the osteomyelitis in the left great toe by plain x-ray. I have renewed that again today. I am still suspicious about osteomyelitis in the amputation site and would consider doing another MRI to compare with the one done in September. The idea of hyperbaric oxygen certainly comes up for discussion 1/24; no major change in either wound area. I have him on doxycycline for osteomyelitis at the tip of the left great toe. As noted he has been previously and recently revascularized by Dr. Andree Elk at [place]. He is tolerating the doxycycline well. For some reason we do not have an infectious disease consult yet. Culture of drainage from  the right foot site last week was negative 1/31; MRI of the right foot did not show osteomyelitis of the right ankle and foot. Notable for a skin ulceration overlying the second metatarsal stump with generalizing soft tissue edema of the ankle and foot consistent with cellulitis. Noted to have a partial-thickness tear of the Achilles tendon 7.5 cm proximal to the insertion Nothing really new in terms of symptoms. Patient's area on the tip of the left great toe is just about closed although he still has the probing area on the metatarsal amputation site. Appointment with Dr. Linus Salmons of infectious disease next week 2/7; Dr. Novella Olive did not feel that any further antibiotics were necessary he would follow-up in 2 months. The left great toe appears to be closed still some surface callus that I gently looked under high did not see anything open or anything that was threatening to be open. He still has the open area on the mid part of his TMA site using endoform 2/14; left great toe is closed and he is completing his doxycycline as of last Sunday. He is not on any antibiotics. Unfortunately out of the right foot his wife noticed some subdermal hemorrhage this week. He had been walking 2 miles I had given him permission to do so this is not really a plantar wound. Using endoform to this wound but I changed to silver alginate this week 2/21; left great toe remains closed. Culture last week grew Streptococcus angiosis which I am not really familiar with however it is penicillin sensitive and I am going to put him on Augmentin. Not much change in the wound on the right foot the deep area is still probing precariously close to bone and the wound on the margin of the TMA is larger. 2/28; left great toe remains closed. He is completing the Augmentin I gave him last week. 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 This is being shown to Dr. Andree Elk at [place] to see if there  is anything else that can be done here. He has been using silver alginate strips on the right 3/6; left great toe remains closed. He sees Dr. Andree Elk on Monday. The area on the plantar aspect of the right foot has the same small orifice with thick callused tissue around this. However this time with removal of the callus tissue the wound is open all the way along the  incision line to the end medially. We have been using silver alginate. 3/13; left toe remains closed although the area is callused. He sees Dr. Jacqualyn Posey of podiatry next week. The area on the plantar right foot looked a lot better this week. Culture I did of this was negative we use silver alginate. He is going next week for an attempt at revascularization by Dr. Andree Elk 3/23; left toe remains closed although the area is callused. He will see Dr. Jacqualyn Posey in follow-up. The area on the right plantar foot continues to look surprisingly better over the last 3 visits. We have been using silver alginate. The revascularization he was supposed to have by Dr. Andree Elk at [facility] in [place] has been canceled [facility] procedure] 4/6; the right foot remains closed albeit callused. Podiatry canceled the appointment with regards to the left great toe. He has not seen Dr. Andree Elk at [facility] but thinks that Dr. Andree Elk has "done all he can do". He would be a candidate for the hemostaemix trial Readmission 07/29/2019 [name] is a man we know well from at least 3 previous stays in this clinic. He is a type II diabetic with severe PAD followed by Dr. Andree Elk at [facility] in [place]. 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 So far everything in both of these areas has remained closed. About 2 weeks ago he went to see his podiatrist [facility] Dr. Babs Bertin. He had a thick callus on the left fifth metatarsal head that was  shaved. He has developed an open wound in this area. They have been offloading this in his diabetic shoes. The patient has not had any more revascularizations by Dr. Andree Elk since the last time he was here. ABI in our clinic at the posterior tibial on the left was 1.06 10/6; no real change in the area on the plantar met head. Small wound with 2 mm of depth. He has thick skin probably from pressure around the wound. We have been using silver alginate 10/13; small wound in the left fifth plantar met head. Arrives today with undermining laterally and purulent drainage. Our intake nurse cultured this. His wife stated they noticed a change in color over the last day or 2. He is not systemically unwell 10/19; small wound on the fifth plantar metatarsal head. Culture I did last week showed Staphylococcus lugdunensis. Although this could be a skin contaminant the possibility of a skin and soft tissue infection was there. I did give him empiric doxycycline which should have covered this. We are using silver alginate to the wound. We put him in a total contact cast today. 10/22; small wound on the fifth plantar metatarsal head. He has completed antibiotics. He also has severe PAD which worries me about just about any wound on this man's foot 10/29; small superficial area on the fifth plantar metatarsal head. Measuring slightly smaller. He is not currently on any antibiotics. I been using a total contact cast in this man with very severe PAD but he seems to be tolerating this well 11/5; left plantar fifth metatarsal head. Silver alginate being used under a total contact cast 11/12; wound not much different than last week. Using a #15 scalpel debridement around the wound. Still silver collagen under a total contact cast. He has severe PAD and that may be playing a role in  this 11/19; disappointing that the wound is not really changed that much. I think it is come down in overall surface area because  originally this was on the lateral part of the fifth metatarsal head however recently it is not really changed. Most of this is filled in but it will not epithelialized there is surface debris on this which may be mostly related to ischemia. They have an appointment with Dr. Andree Elk on 12/9 09/24/2019 on evaluation today patient appears to be doing somewhat better with regard to the wound on the left fifth metatarsal head. Fortunately there does not appear to be any signs of active infection at this time. No fevers, chills, nausea, vomiting, or diarrhea. The wound does not appear to be completely closed and I do feel like the Hydrofera Blue is helping to some degree although I feel like the cast as well as keeping things from breaking down or getting any larger at least. As far as his wound overview it shows that the overall size of the wound is slightly smaller but really maintaining within the realm of about the same. He had no troubles with the cast which is good news. 12/1; left fifth metatarsal head. Perhaps somewhat more vibrant. We have been using Hydrofera Blue. He sees Dr. Andree Elk at [place] 1 week tomorrow. He be back next week and I hope to have a better idea which way the wound is going however I will not cast him next week in case Dr. Andree Elk wants to reevaluate things in his foot. The left leg is the leg with the stent in place and I wonder whether these are going to have to be reevaluated. 12/8; left fifth metatarsal head. Quite a bit better this week. Only a small open area remains. He sees Dr. Andree Elk at [place] tomorrow. Dr. Andree Elk is previously done his vascular interventions. He has 2 stents in the left leg as I remember things. I therefore will not put him in a total contact cast today. He will be using a forefoot off loader. We will use silver collagen on the wound. We will see him again next week 12/15; left fifth metatarsal head. He saw Dr. Andree Elk and is scheduled for an angiogram on  Thursday. Unfortunately although his wound was a lot better last week it is really deteriorated this week. Small punched-out hole with depth and overhanging tissue. We have been using Hydrofera Blue 12/22; patient had an 80% stenosis proximal to the stent I believe in the below-knee popliteal artery. This was opened by Dr. Andree Elk. According the patient the stent itself was satisfactory. I have not been able to review this note. 12/29; patient arrives today with the wound about the same size some undermining medially. We have been using Hydrofera Blue under a total contact cast and that is what we will do again today 11/04/2019. Slightly smaller in orifice but about 4 mm undermining almost circumferentially. We have been using Hydrofera Blue. Apparently the Hydrofera Blue did not stay on the wound after we removed the cast. Some minor looking cast irritation on the right lateral 1/12; unfortunately things did not go well this week. Arrives in clinic today with a deeper wound with undermining more concerning a area of pus. Specimen was obtained for culture. We are not going to be able to put him in a cast today. 1/19; purulent material last week which I cultured showed Enterobacter cloacae. He was on ampicillin previously prescribed by his primary doctor which should not have covered this  however mysteriously the wound looks somewhat better. There is less depth less erythema certainly no drainage. He will need a course of ciprofloxacin which I will provide today. 1/26; he has completed the ciprofloxacin. I don't think he needs any additional antibiotics. The areas on the left fifth plantar met head. We've been using silver alginate 2/2; comes in today with 0.4 cm of circumferential undermining around a small wound in this area. We have been using silver alginate with a forefoot offloading boot 2/9; again the wound appears larger nonviable surface and with 0.4 cm roughly of circumferential  undermining. We have been using silver alginate with a forefoot offloading boot. The wound does not look infected however his wife is quick to point out about swelling on the dorsal foot. 2/16; not much change. Comes in with a small wound with a nonviable necrotic surface. Again marked undermining that I had totally removed last week. He had a arterial Doppler last week in Dr. Andree Elk office at [facility]. They have not heard these results. They are somewhat frustrated. 2/23; if anything this is worse. Undermining increased depth. Nonviable surface that looks pale. According his wife is TBI on the right was 0.22 although I have not verified this. He is going for an angiogram with Dr. Andree Elk next Monday. We are using polymen and offloading in a forefoot offloading boot. Electronic Signature(s) Signed: 12/23/2019 6:00:34 PM By: [name] MD Entered By: [name] on 12/23/2019 13:24:59 -------------------------------------------------------------------------------- Physical Exam Details Patient Name: Date of Service: [name], [name] 12/23/2019 12:30 PM Medical Record [id number] Patient Account Number: [id number] Date of Birth/Sex: Treating RN: 04-09-1946 (73 y.o. [name] Primary Care Provider: [name] Other Clinician: Referring Provider: Treating Provider/Extender:Omarian Jaquith, [name], [name] in Treatment: 21 Constitutional Sitting or standing Blood Pressure is within target range for patient.. Pulse regular and within target range for patient.Marland Kitchen Respirations regular, non-labored and within target range.. Temperature is normal and within the target range for the patient.Marland Kitchen Appears in no distress. Cardiovascular The pulses are not palpable on the left although his foot is reasonably warm including his first toe. Notes Wound exam; no purulent drainage but a fibrinous surface with almost 0.55 cm of undermining. No debridement. I am worried that there is not  a viable surface over the fifth MTP. Electronic Signature(s) Signed: 12/23/2019 6:00:34 PM By: [name] MD Entered By: [name] on 12/23/2019 13:26:20 -------------------------------------------------------------------------------- Physician Orders Details Patient Name: Date of Service: [name], [name] 12/23/2019 12:30 PM Medical Record [id number] Patient Account Number: [id number] Date of Birth/Sex: Treating RN: [birth year] (73 y.o. [name]) [name] Primary Care Provider: [name] Other Clinician: Referring Provider: Treating Provider/Extender:Maicee Ullman, [name], [name] in Treatment: 21 Verbal / Phone Orders: No Diagnosis Coding ICD-10 Coding Code Description E11.621 Type 2 diabetes mellitus with foot ulcer E11.51 Type 2 diabetes mellitus with diabetic peripheral angiopathy without gangrene L97.521 Non-pressure chronic ulcer of other part of left foot limited to breakdown of skin L03.116 Cellulitis of left lower limb Follow-up Appointments Return Appointment in 1 week. Dressing Change Frequency Wound #5 Left Metatarsal head fifth Change Dressing every other day. Wound Cleansing Wound #5 Left Metatarsal head fifth May shower and wash wound with soap and water. Primary Wound Dressing Wound #5 Left Metatarsal head fifth Polymem Silver Other: - pad right great toe for protection Secondary Dressing Kerlix/Rolled Gauze Dry Gauze Off-Loading Other: - Fore foot off loader Electronic Signature(s) Signed: 12/23/2019 6:00:34 PM By: [name] MD Signed: 12/23/2019 6:06:02 PM By: [name] RN  Entered By: [name] on 12/23/2019 13:14:49 -------------------------------------------------------------------------------- Problem List Details Patient Name: Date of Service: [name], [name] 12/23/2019 12:30 PM Medical Record [id number] Patient Account Number: [id number] Date of Birth/Sex: Treating RN: 1946/07/14 (73 y.o. [name]) [name] Primary Care Provider: [name] Other Clinician: Referring Provider: Treating Provider/Extender:Kelen Laura, [name], [name] in Treatment: 21 Active Problems ICD-10 Evaluated Encounter Code Description Active Date Today Diagnosis E11.621 Type 2 diabetes mellitus with foot ulcer 07/29/2019 No Yes E11.51 Type 2 diabetes mellitus with diabetic peripheral 07/29/2019 No Yes angiopathy without gangrene L97.521 Non-pressure chronic ulcer of other part of left foot 07/29/2019 No Yes limited to breakdown of skin L03.116 Cellulitis of left lower limb 08/12/2019 No Yes Inactive Problems Resolved Problems Electronic Signature(s) Signed: 12/23/2019 6:00:34 PM By: [name] MD Entered By: [name] on 12/23/2019 13:23:36 -------------------------------------------------------------------------------- Progress Note Details Patient Name: Date of Service: [name], [name] 12/23/2019 12:30 PM Medical Record [id number] Patient Account Number: [id number] Date of Birth/Sex: Treating RN: [birth year] (73 y.o. [name] Primary Care Provider: [name] Other Clinician: Referring Provider: Treating Provider/Extender:Canton Yearby, [name], [name] in Treatment: 21 Subjective History of Present Illness (HPI) 05/18/16; this is a 74year-old diabetic who is a type II diabetic on insulin. The history is that he traumatized his right foot developed a sore sometime in late March. Shortly thereafter he went on a cruise but he had to get off the cruise ship in [place] and fly urgently back to [place] where he was admitted to [facility] and ultimately underwent a transmetatarsal amputation by Dr. 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 According to the patient and his wife this wound never really healed. He was seen on 2 occasions in the wound care center in [place] and had vascular studies and then was referred urgently to Dr. Bridgett Larsson of vascular surgery. He  underwent an angiogram on 05/10/16. Unfortunately nothing really could be done to improve his vascular status. He had a 75-90% stenosis in the midsegment of 1 segment of the posterior femoral artery. He had a patent popliteal, his anterior tibial occluded shortly after takeoff. Perineal had a greater than 90% stenosis posterior tibial is occluded feet had no distal collaterals feed distal aspect of the transmetatarsal amputation site. The patient tells me that he had a prolonged period of Santyl by Dr. Doran Durand was some initial improvement but then this was stopped. I think they're only applying daily dressings/dry dressings. He has not had a recent x-ray of the right foot he did have one before his surgery in April. His wife by the dimensions of the wound/surgical site being followed at home since 4/20. At that point the dimensions were 0.5 x 12 x 0.2 on 7/19 this was 1.8 x 6 x 0.4. He is not currently on any antibiotics. His hemoglobin A1c in early April was 12.9 at that point he was started on insulin. Apparently his blood sugars are much lower he has an appointment with Dr. Legrand Como Alteimer of endocrine next week. 05/29/16 x-ray of the area did not show osteomyelitis. I think he probably needs an MRI at this point. His wife is asking about something called"Yireh" cream which is not FDA approved. I have not heard of this. 06/22/16; MRI did not really suggest osteomyelitis. There was minimal marrow edema and enhancement in the stump of the second metatarsal felt to be secondary likely to postoperative change rather than osteomyelitis. The patient has arranged his own consultation with Dr. Andree Elk at [place], apparently their daughter lives  in [place] and has some connection here. Any improvement in vascular supply by Dr. Andree Elk would of course be helpful. Dr. Bridgett Larsson did not feel that anything further could be done other than amputation if wound care did not result in healing or if the area  deteriorates. 06/26/16; the patient has been to see Dr. Andree Elk at [place] and had an angiogram. He is going for a procedure on Thursday which will involve catheterization. I'm not sure if this is an anterograde or retrograde approach. He has been using Santyl to the wound 07/10/16; the patient had a repeat angiogram and angioplasty at [place] by Dr. Brunetta Jeans. His angiogram showed right CFA and profundal widely patent. The right as of a.m. popliteal artery were widely patent the right anterior tibial was occluded proximally and reconstitutes at the ankle. Peroneal artery was patent to the foot. Posterior tibial artery was occluded. The patient had angioplasty of the anterior tibial artery.. This was quite successful. He was recommended for Plavix as well as aspirin. 07/17/16; the patient was close to be a nurse visit today however the outer dressing of the Apligraf fell off. Noted drainage. I was asked to see the wound. The patient is noted an odor however his wife had noted that. Drainage with Apligraf not necessarily a bad thing. He has not been systemically unwell 07/24/16; we are still have an issue with drainage of this wound. In spite of this I applied his second Apligraf. Medially the area still is probing to bone. 08/07/16; Apligraf reapplied in general wound looks improved. 08/21/16 Apligraf #4. Wound looks much better 09/04/16 patientt's wound again today continues to appear to improve with the application of the Apligraf's. He notes no increased discomfort or concerns at this point in time. 09/18/16; the patient returns today 2 weeks after his fifth application of Apligraf. Predictably three quarters of the width of this wound has healed. The deep area that probe to bone medially is still open. The patient asked how much out-of-pocket dollars would be for additional Apligraf's. 09/25/16; now using Hydrofera Blue. He has completed 5 Apligraf applications with considerable improvement in this deep  open transmetatarsal amputation site. His wound is now a triangular-shaped wound on the medial aspect. At roughly 12 to 2:00 this probes another centimeter but as opposed to in the past this does not probe to bone. The patient has been seen at [place] by Dr. Zenia Resides. He is not planning to do any more revascularization unless the wound stalls or worsens per the patient 10/02/16; 0.7 x 0.8 x 0.8. Unfortunately although the wound looks stable to improved. There is now easily probable bone. This hasn't been present for several weeks. Patient is not otherwise symptomatic he is not experiencing any pain. I did a culture of the wound bed 10/09/16. Deterioration last week. Culture grew MRSA and although there is improvement here with doxycycline prescribed over the phone I'm going to try to get him linezolid 600 twice a day for 10 days today. 10/16/16; he is completing a weeks worth of linezolid and still has 3 more days to go. Small triangular-shaped open area with some degree of undermining. There is still palpable bone with a curet. Overall the area appears better than last week 10/20/16 he has completed the linezolid still having some nausea and vomiting but no diarrhea. He has exposed bone this week which is a deterioration. 10/27/16 patient now has a small but probing wound down to bone. Culture of this bone that I did last week  showed a few methicillin-resistant staph aureus. I have little doubt that this represents acute/subacute osteomyelitis. The patient is currently on Doxy which I will continue he also completed 10 days of linezolid. We are now in a difficult situation with this patient's foot after considerable discussion we will send him back to see Dr. Doran Durand for a surgical opinion of this I'm also going to try to arrange a infectious disease consult at [facility] hopefully week and get this prior to her usual 4-6 weeks we having [place]. The patient clearly is going to need 6 weeks of IV  vancomycin. If we cannot arrange this expediently I'll have to consider ordering this myself through a home infusion company 11/03/16; the patient now has a small in terms of circumference but probing wound. No bone palpable today. The patient remains on doxycycline 100 twice a day which should support him until he sees infectious disease at [facility] next week the following week on Wednesday I believe he has an appointment with Dr. Andree Elk at [facility] who is his vascular cardiologist. Finally he has an appointment with Dr. Doran Durand on 11/22/16 we have been using silver alginate. The patient's wife states they are having trouble getting this through [facility] 11/13/16; the patient was seen by infectious disease at [facility] in the 11th PICC line placed in preparation for IV antibiotics. A tummy he has not going to get IV vancomycin o Ceftaroline. They've also ordered an MRI. Patient has a follow-up with Dr. Doran Durand on 11/22/16 and Dr. Andree Elk at [facility] tomorrow 11/23/16 the patient is on daptomycin as directed by infectious disease at [facility]. He is also been back to see Dr. Andree Elk at [facility]. He underwent a repeat arteriogram. He had a successful PTA of the right anterior tibial artery. He is on dual antiplatelete treatment with Plavix and aspirin. Finally he had the MRI of his foot in [place]. This showed cellulitis about the foot worse distally edema and enhancement in the reminiscent of the second metatarsal was consistent with osteomyelitis therefore what I was assuming to be the first metatarsal may be actually the second. He also has a fluid collection deep to the calcaneus at the level of the calcaneal spur which could be an abscess or due to adventitial bursitis. He had a small tear in his Achilles 11/30/16; the patient continues on daptomycin as directed by infectious disease at [facility]. He is been revascularized by Dr. Andree Elk at [facility] in [place]. He has been to see Gretta Arab who was the  orthopedic surgeon who did his original amputation. I have not seen his not however per the patient's wife he did not offer another surgical local surgical prodecure to remove involved bone. He verbalized his usual disbelief in not just hyperbarics but any medical therapy for this condition(osteomyelitis). I discussed this in detail with the patient today including answering the question about a BKA definitively "curing" the current condition. 12/07/16; the patient continues on daptomycin as directed by infectious disease at [facility]. This is directed at the MRSA that we cultured from his bone debridement from 12/22. Lab work today shows a white count of 8.7 hemoglobin of 10.5 which is microcytic and hypochromic differential count shows a slightly elevated monocyte count at 1.2 eosinophilic count of 0.6. His creatinine is 1.11 sedimentation rate apparently is gone from 35-34 now 40. I explained was wife I don't think this represents a trend. His total CK is 48 12/14/16- patient is here for follow-up evaluation of his right TMA site.  He continues to receive IV daptomycin per infectious disease. His serum inflammatory markers remain elevated. He complains of intermittent pain to the medial aspect of the TMA site with intermittent erythema. He voices no complaints or concerns regarding hyperbaric therapy. Overall he and his wife are expressing a frustration and discouragement regarrding the length of time of treatment. 12/21/16; small open wound at roughly the first or second metatarsal metatarsalphalyngeal joint reminiscence of his transmetatarsal amputation site he continues to receive IV daptomycin per infectious disease at [facility]. He will finish these a week tomorrow. He has lab work which I been copied on. His white count is 10.8 hemoglobin 8.9 MCV is low at 75., MCH low at 24.5 platelet count slightly elevated at 626. Differential count shows 70% neutrophils 10% monocytes and 10% eosinophils. His  comprehensive metabolic panel shows a slightly low sodium at 133 albumin low at 3.1 total CK is normal at 42 sedimentation rate is much higher at 82. He has had iron studies that show a serum iron of 15 and iron binding capacity of 238 and iron saturation of 6. This is suggestive of iron deficiency. B12 and folate were normal ferritin at 113 The patient tells me that he is not eating well and he has lost weight. He feels episodically nauseated. He is coughing and gagging on mucus which she thinks is sinusitis. He has an appointment with his primary doctor at 5:00 this afternoon in [facility] 01/02/17; the patient developed a subacute pneumonitis. He was admitted to [facility] after a CT scan showed an extensive interstitial pneumonitis [I have not yet seen this]. 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 He has since been discharged. He is not on oxygen. He feels fatigued and very short of breath with exertion. At [facility] the wound care nurse there felt that his wound was healed. He did complete his daptomycin and has follow-up with infectious disease on Friday. X-rays I did before he went to [facility] still suggested residual osteomyelitis in the anterior aspect of the second must metatarsal head. I'm not sure I would've expected any different. There was no other findings. I actually think I did this because of erythema over the first metatarsal head reminiscent 01/11/17; the patient has eosinophilic pneumonitis. He has been reviewed by pulmonology and given clearance for hyperbarics at least that's what his wife says. I'll need to see if there is note in care everywhere. Apparently the prognosis for improvement of daptomycin induced eosinophilic granulocyte is is 3 months without steroids. In the meantime infectious disease has placed him on doxycycline until the wound is closed. He is still  is lost a lot of weight and his blood sugars are running in the mid 60s to low 80s fasting and at all times during the day 01/18/17; he has had adjustments in his insulin apparently his blood sugars in the morning or over 100. He wants to restart his hyperbaric treatment we'll do this at 1:00. 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 I think there is good reason to complete his treatments in order to give him the best chance of maintaining a healed status and these DFU 3 wounds with MRSA infection in the bone 02/15/17; the patient was seen today in conjunction with HBO. He completed hyperbaric oxygen today. The open area on his transmetatarsal site has remained closed. There was an  area of erythema when I saw him earlier in the week on the posterior heel although that is resolved as of today as well. He has been using a cam walker. This is a patient who came to [place] after a transmetatarsal amputation that was necrotic and dehisced. He required revascularization percutaneously on 2 different occasions by Dr. Andree Elk of invasive cardiology at [facility]. 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 He went to [facility] infectious disease and had IV daptomycin for 5 weeks before developing eosinophilic pneumonitis and requiring an admission to hospital/ICU. He made a good recovery and is continued on doxycycline since. As mentioned his foot is closed now. He has a follow-up with Dr. Andree Elk tomorrow. He is going to [employer] on Monday for a custom-made shoe READMISSION Last visit Dr. Andree Elk [facility] 02/16/17 1. Critical limb ischemia of the RLE:  s/p transmetatarsal amputation of the RLE, now completely healed.  s/p right ATA percutaneous revascularization procedure x2, most recently 11/20/2016 s/p PTA of right AT 100%  to less than 20% with a 2.5 x 200 balloon.  He does have some residual osteomyelitis, but given the wound is closed, the orthopedist has recommended to follow. He has a fitting for a special shoe coming up next week. He has completed a course of daptomycin and doxycycline and he has been discharged by ID. He has completed a course of hyperbaric therapy.  Continue Continue medical management with aspirin, Plavix and statin therapy. We will discuss ongoing Plavix therapy at follow-up in 6 months.  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 Given no symptoms, we will conservatively manage. He doesn't want to do any more invasive studies at this time, which is reasonable. The patient arrives today out of 2 concerns both on the right transmetatarsal site. 1 at the level of the reminiscent fifth metatarsal head and the other at roughly the first or second. Both of these look like dark subcutaneous discoloration probably subdermal bleeding. He is recently obtained new adaptive footwear for the right foot. He also has a callus on the left fifth dorsal toe however he follows with podiatry for this and I don't think this is any issue. ABIs in this clinic today were 0.66 on the right and 1.06 on the left. On entrance into our clinic initially this was 0.95 and 0.92. He does not describe current claudication. They're going away on a cruise in 8 weeks and I think are trying to do the month is much as they can proactively. They follow with podiatry and have an appointment with Dr. Andree Elk in October READMISSION 06/25/18 This is a patient that we have not seen in almost a year. He is a type II diabetic with known PAD. He is followed by Dr. Andree Elk of interventional cardiology at [facility] in [place]. He is required revascularization for significant PAD. When we first saw him he  required a transmetatarsal amputation. He had underlying osteomyelitis with a nonhealing surgical wound. This eventually closed with wound care, IV antibiotics and hyperbaric oxygen. They tell me that he has a modified shoe and he is very active walking up to 4 miles a day. He is followed by Dr. Geroge Baseman of podiatry. His wife states that he underwent a removal of callus over this site on July 9. She felt there  may be drainage from this site after that although she could never really determined and there was callus buildup again. On 06/01/18 there was pressure bleeding through the overlying callus. he was given a prescription for 7 days of Bactrim. Fortuitously he has an appointment with Dr. Andree Elk on 06/04/18 and he immediately underwent revascularization of the right leg although I have not had a chance to review these records in care everywhere. This was apparently done through anterior and retrograde access. On 06/06/18 he had another debridement by Dr. Bernette Mayers. Vitamin soaking with Epsom salts for 20 minutes and applying calcium alginate. The original trans-met was on April 2017 I believe by Dr. Doran Durand. His ABI in our clinic was noncompressible today. 07/02/18; x-ray I ordered last week was negative for osteomyelitis. Swab culture was also negative. He is going to require an MRI which I have ordered today. 07/09/18; surprisingly the MRI of the foot that I ordered did not show osteomyelitis. He did suggest the possibility of cellulitis. For this reason I'll go ahead and give him a 10 day course of doxycycline. Although the previous culture of this area was negative 07/16/18; it arrives with the wound looking much the same. Roughly the same depth. He has thick subcutaneous tissue around the wound orifice but this still has roughly the same depth. Been using silver alginate. We applied Oasis #1 today 07/23/2018; still having to remove a lot of callus and thick subcutaneous tissue to actually define the wound  here. Most of this seems to have closed down yet he has a comma shaped divot over the top of the area that still I think is open. We applied Oasis #2 His wife expressed concern about the tip of his left great toe. This almost looks like a small blister. She also showed it today to her podiatrist Dr. Geroge Baseman who did not think this was anything serious. I am not sure is anything serious either however I think it bears some watching. He is not in any pain however he is insensate 07/30/2018;; still on a lot of nonviable tissue over the surface of the wound however cleaning this up reveals a more substantial wound orifice but less of a probing wound depth. This is not probed to bone. There is no evidence of infection The wife is still concerned about a non-open area on the tip of his left great toe. Almost feels like a bony outgrowth. She had previously showed this to podiatry. I do not think this is a blister. A friction area would be possible although he is really not walking according to his wife. He had a small skin tag on his right buttock but no open wound here either 08/06/2018; we applied a third Oasis last week. Unfortunately there is really no improvement. Still requiring extensive debridement to expose the wound bed from a horizontal slitlike depression. I still have not been able to get this to fill in properly. On the positive side there is now no probable bone from when he first came into the facility. I changed him to silver alginate today after a reasonably aggressive debridement 08/13/2018; once again the patient comes in with skin and subcutaneous tissue closing over the small probing area with the underlying cavity of the wound on the right TMA site. I applied silver alginate to this last week. Prior to that we used Oasis x3 still not able to get this area granulating. He does not have a probing area of the bone which is an improvement from  when I spur started working on this and an  MRI did not suggest osteomyelitis. I do not see evidence of infection here but I am increasingly concerned about why I cannot get this area to granulate. Each time I debrided this is looks like this is simply a matter of getting granulation to fill in the hole and then getting epithelialization. This does not seem to happen Also I sent him back to see podiatry Dr. Earleen Newport about the what felt to be bony outgrowth on the tip of his left great toe. Apparently after heel he left the clinic last week or the next day he developed a blood blister. He did see Dr. Earleen Newport. He went on to have a debridement of the medial nail cuticle he now has an open area here as well as some denuded skin. They did an x-ray apparently does have a bony outgrowth or spur but I am not able to look at this. They are using topical antibiotics apparently there was some suggested he use Santyl. Patient's wife was anxious for my opinion of this 08/20/2018; we are able to keep the wound open this time instead of the thick subcutaneous tissue closing over the top of it however unfortunately once again this probes to bone. I do not see any evidence of infection and previous MRI did not show osteomyelitis. I elected to go back to the Oasis to see if we can stimulate some granulation. Last saw his vascular interventional cardiologist Dr. Andree Elk at the beginning of August and he had a repeat procedure. Nevertheless I wonder how much blood flow he has down to this area With regards to the left first toe he is seeing Dr. Earleen Newport next week. They are applying Bactroban to this area. 08/27/2018 ooOnce again he comes in with thick eschar and subcutaneous tissue over the top of the small probing hole. This does not appear to go down to bone but it still has roughly the same depth. I reapplied Oasis today ooOver the left great toe there appears to be more of the wound at the tip of his toe than there was last week. This has an eschar on the  surface of it. Will change to Santyl. There is seeing podiatry this afternoon 09/03/2018 ooHe comes in today with the area on the transmetatarsal's site with a fair amount of callus, nonviable tissue over the circumference but it was not closed. I removed all of this as well as some subcutaneous debris and reapplied Oasis. There is no exposed bone ooThe area over the tip of the left great toe started off as a nodule of uncertain etiology. He has been followed with podiatry. They have been removing part of the medial nail bed. He has nonviable tissue over the wound he been using Santyl in this area 09/10/18 ooUnfortunately comes in with neither wound area looking improved. The transmetatarsal amputation site once Again has nonviable debris over the surface requiring debridement. Unfortunately underneath this there is nothing that looks viable and this once again goes right down to bone. There is no purulent drainage and no erythema. ooAlso the surgical wound from podiatry on the left first toe has an ischemic-looking eschar over the surface of the tip of the toe I have elected not to attempt candidly debride this ooHis wife as arranged for him to have follow-up noninvasive studies in [place] under the care of Dr. Andree Elk clinic. The question is he has known severe PAD. They had recently seen him in August. He has  had revascularizations in both legs within the last 4 or 5 months. He is not complaining of pain and I cannot really get a history of claudication. He has not systemically unwell 09/20/2018 the patient has been to [facility] and been revascularized by Dr. Andree Elk earlier this week. Apparently he was able to open up the anterior tibial artery although I have not actually seen his formal report. He is going for an attempt to revascularize on the left on Monday. He is apparently working with an investigational stent for lower extremity arteries below the knee and he has talked to the  patient about placing that on Monday if possible. The patient has been using silver alginate on the transmetatarsal amputation site and Santyl on the left 09/30/2018; patient had his revascularization on the left this apparently included a standard approach as well as a more distal arterial catheterization although I do not have any information on this from Dr. Andree Elk. In fact I do not even see the initial revascularization that he had on the right. I have included the arterial history from Dr. Andree Elk last note however below; ASSESSMENT/PLAN: 1. Hx of Critical limb ischemia bilateral lower extremities, PAD: -s/p transmetatarsal amputation of the RLE. -s/p right ATA percutaneous revascularization procedure x2, most recently 11/20/2016 s/p PTA of right AT 100% to less than 20% with a 2.5 x 200 balloon. 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 -03/21/2018 s/p PTA of 90% left popliteal artery to <10% with a 5x20 cutting balloon, PTA of 90% left peroneal to <20% with a 3x20 balloon, PTA of 100% left AT to <20% with a 2.5x220 balloon. 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 Will plan on the LLE soon after. Risks/benefits of procedure have been discussed and patient has elected to proceed. We have been using endoform to the right TMA amputation site wound and Santyl to the left great toe 10/07/2018; the area on the tip of his left great toe looked better we have been using Santyl here. We continue to have a very difficult probing hole on the right TMA amputation site we have been using endoform. I went on to use his fifth Oasis today 10/14/2018; the tip of the left great toe continues to look better. We have been using Santyl  here the surface however is healthy and I think we can change to an alginate. The right TMA has not changed. Once again he has no superficial opening there is callus and thick subcutaneous tissue over the orifice once you remove this there is the probing area that we have been dealing with without too much change. There is no palpable bone I have been placing Oasis here and put Oasis #6 in this today after a more vigorous debridement 10/21/18; the left great toe still has necrotic surface requiring debridement. The right TMA site hasn't changed in view of the thick callus over the wound bed. With removal of this there is still the opening however this does not appear to have the same depth. Again there is no palpable bone. Oasis was replaced 10/28/2018; patient comes in with both wounds looking worse. The area over the first toe tip is now down to bone. The area over the TMA site is deeper and  down to bone clearly with a increase in overall wound area. Equally concerning on the right TMA is the complete absence of a pulse this week which is a change. We are not even able to Doppler this. On the right he has a noncompressible ABI greater than 1.4 11/04/2018. Both wounds look somewhat worse. X-rays showed no osteomyelitis of the right foot but on the left there was underlying osteomyelitis in the left great toe distal phalanx. I been on the phone to Dr. Andree Elk surface at [facility] in [place] and they are arranging for another angiogram on the right on Monday. I have him on doxycycline for the osteomyelitis in the left great toe for now. Infectious disease may be necessary 1/17; 2-week hiatus. Patient was admitted to hospital at [place]. My understanding is he underwent an angioplasty of the right anterior tibial artery and had stents placed in the left anterior artery and the left tibial peroneal trunk. This was done by Dr. Andree Elk of interventional radiology. There is no major change in either 1 of the  wounds. They have been using Aquacel Ag. As far as they are aware no imaging studies were done of the foot which is indeed unfortunate. I had him on doxycycline for 2 weeks since we identified the osteomyelitis in the left great toe by plain x-ray. I have renewed that again today. I am still suspicious about osteomyelitis in the amputation site and would consider doing another MRI to compare with the one done in September. The idea of hyperbaric oxygen certainly comes up for discussion 1/24; no major change in either wound area. I have him on doxycycline for osteomyelitis at the tip of the left great toe. As noted he has been previously and recently revascularized by Dr. Andree Elk at [place]. He is tolerating the doxycycline well. For some reason we do not have an infectious disease consult yet. Culture of drainage from the right foot site last week was negative 1/31; MRI of the right foot did not show osteomyelitis of the right ankle and foot. Notable for a skin ulceration overlying the second metatarsal stump with generalizing soft tissue edema of the ankle and foot consistent with cellulitis. Noted to have a partial-thickness tear of the Achilles tendon 7.5 cm proximal to the insertion Nothing really new in terms of symptoms. Patient's area on the tip of the left great toe is just about closed although he still has the probing area on the metatarsal amputation site. Appointment with Dr. Linus Salmons of infectious disease next week 2/7; Dr. Novella Olive did not feel that any further antibiotics were necessary he would follow-up in 2 months. The left great toe appears to be closed still some surface callus that I gently looked under high did not see anything open or anything that was threatening to be open. He still has the open area on the mid part of his TMA site using endoform 2/14; left great toe is closed and he is completing his doxycycline as of last Sunday. He is not on any antibiotics. Unfortunately out of  the right foot his wife noticed some subdermal hemorrhage this week. He had been walking 2 miles I had given him permission to do so this is not really a plantar wound. Using endoform to this wound but I changed to silver alginate this week 2/21; left great toe remains closed. Culture last week grew Streptococcus angiosis which I am not really familiar with however it is penicillin sensitive and I am going to put him on  Augmentin. Not much change in the wound on the right foot the deep area is still probing precariously close to bone and the wound on the margin of the TMA is larger. 2/28; left great toe remains closed. He is completing the Augmentin I gave him last week. 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 This is being shown to Dr. Andree Elk at [place] to see if there is anything else that can be done here. He has been using silver alginate strips on the right 3/6; left great toe remains closed. He sees Dr. Andree Elk on Monday. The area on the plantar aspect of the right foot has the same small orifice with thick callused tissue around this. However this time with removal of the callus tissue the wound is open all the way along the incision line to the end medially. We have been using silver alginate. 3/13; left toe remains closed although the area is callused. He sees Dr. Jacqualyn Posey of podiatry next week. The area on the plantar right foot looked a lot better this week. Culture I did of this was negative we use silver alginate. He is going next week for an attempt at revascularization by Dr. Andree Elk 3/23; left toe remains closed although the area is callused. He will see Dr. Jacqualyn Posey in follow-up. The area on the right plantar foot continues to look surprisingly better over the last 3 visits. We have been using silver alginate. The revascularization he was supposed to have by Dr. Andree Elk at [facility] in [place] has been canceled [facility] procedure] 4/6; the right foot  remains closed albeit callused. Podiatry canceled the appointment with regards to the left great toe. He has not seen Dr. Andree Elk at [facility] but thinks that Dr. Andree Elk has "done all he can do". He would be a candidate for the hemostaemix trial Readmission 07/29/2019 [name] is a man we know well from at least 3 previous stays in this clinic. He is a type II diabetic with severe PAD followed by Dr. Andree Elk at [facility] in [place]. 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 So far everything in both of these areas has remained closed. About 2 weeks ago he went to see his podiatrist at [facility] Dr. Babs Bertin. He had a thick callus on the left fifth metatarsal head that was shaved. He has developed an open wound in this area. They have been offloading this in his diabetic shoes. The patient has not had any more revascularizations by Dr. Andree Elk since the last time he was here. ABI in our clinic at the posterior tibial on the left was 1.06 10/6; no real change in the area on the plantar met head. Small wound with 2 mm of depth. He has thick skin probably from pressure around the wound. We have been using silver alginate 10/13; small wound in the left fifth plantar met head. Arrives today with undermining laterally and purulent drainage. Our intake nurse cultured this. His wife stated they noticed a change in color over the last day or 2. He is not systemically unwell 10/19; small wound on the fifth plantar metatarsal head. Culture I did last week showed Staphylococcus lugdunensis. Although this could be a skin contaminant the possibility of a skin and soft tissue infection was there. I did give him empiric doxycycline which should have  covered this. We are using silver alginate to the wound. We put him in a total contact cast today. 10/22; small wound on the fifth plantar  metatarsal head. He has completed antibiotics. He also has severe PAD which worries me about just about any wound on this man's foot 10/29; small superficial area on the fifth plantar metatarsal head. Measuring slightly smaller. He is not currently on any antibiotics. I been using a total contact cast in this man with very severe PAD but he seems to be tolerating this well 11/5; left plantar fifth metatarsal head. Silver alginate being used under a total contact cast 11/12; wound not much different than last week. Using a #15 scalpel debridement around the wound. Still silver collagen under a total contact cast. He has severe PAD and that may be playing a role in this 11/19; disappointing that the wound is not really changed that much. I think it is come down in overall surface area because originally this was on the lateral part of the fifth metatarsal head however recently it is not really changed. Most of this is filled in but it will not epithelialized there is surface debris on this which may be mostly related to ischemia. They have an appointment with Dr. Andree Elk on 12/9 09/24/2019 on evaluation today patient appears to be doing somewhat better with regard to the wound on the left fifth metatarsal head. Fortunately there does not appear to be any signs of active infection at this time. No fevers, chills, nausea, vomiting, or diarrhea. The wound does not appear to be completely closed and I do feel like the Hydrofera Blue is helping to some degree although I feel like the cast as well as keeping things from breaking down or getting any larger at least. As far as his wound overview it shows that the overall size of the wound is slightly smaller but really maintaining within the realm of about the same. He had no troubles with the cast which is good news. 12/1; left fifth metatarsal head. Perhaps somewhat more vibrant. We have been using Hydrofera Blue. He sees Dr. Andree Elk at [place] 1 week tomorrow.  He be back next week and I hope to have a better idea which way the wound is going however I will not cast him next week in case Dr. Andree Elk wants to reevaluate things in his foot. The left leg is the leg with the stent in place and I wonder whether these are going to have to be reevaluated. 12/8; left fifth metatarsal head. Quite a bit better this week. Only a small open area remains. He sees Dr. Andree Elk at [place] tomorrow. Dr. Andree Elk is previously done his vascular interventions. He has 2 stents in the left leg as I remember things. I therefore will not put him in a total contact cast today. He will be using a forefoot off loader. We will use silver collagen on the wound. We will see him again next week 12/15; left fifth metatarsal head. He saw Dr. Andree Elk and is scheduled for an angiogram on Thursday. Unfortunately although his wound was a lot better last week it is really deteriorated this week. Small punched-out hole with depth and overhanging tissue. We have been using Hydrofera Blue 12/22; patient had an 80% stenosis proximal to the stent I believe in the below-knee popliteal artery. This was opened by Dr. Andree Elk. According the patient the stent itself was satisfactory. I have not been able to review this note. 12/29; patient arrives today with  the wound about the same size some undermining medially. We have been using Hydrofera Blue under a total contact cast and that is what we will do again today 11/04/2019. Slightly smaller in orifice but about 4 mm undermining almost circumferentially. We have been using Hydrofera Blue. Apparently the Hydrofera Blue did not stay on the wound after we removed the cast. Some minor looking cast irritation on the right lateral 1/12; unfortunately things did not go well this week. Arrives in clinic today with a deeper wound with undermining more concerning a area of pus. Specimen was obtained for culture. We are not going to be able to put him in a cast today. 1/19;  purulent material last week which I cultured showed Enterobacter cloacae. He was on ampicillin previously prescribed by his primary doctor which should not have covered this however mysteriously the wound looks somewhat better. There is less depth less erythema certainly no drainage. He will need a course of ciprofloxacin which I will provide today. 1/26; he has completed the ciprofloxacin. I don't think he needs any additional antibiotics. The areas on the left fifth plantar met head. We've been using silver alginate 2/2; comes in today with 0.4 cm of circumferential undermining around a small wound in this area. We have been using silver alginate with a forefoot offloading boot 2/9; again the wound appears larger nonviable surface and with 0.4 cm roughly of circumferential undermining. We have been using silver alginate with a forefoot offloading boot. The wound does not look infected however his wife is quick to point out about swelling on the dorsal foot. 2/16; not much change. Comes in with a small wound with a nonviable necrotic surface. Again marked undermining that I had totally removed last week. He had a arterial Doppler last week in Dr. Andree Elk office at [facility]. They have not heard these results. They are somewhat frustrated. 2/23; if anything this is worse. Undermining increased depth. Nonviable surface that looks pale. According his wife is TBI on the right was 0.22 although I have not verified this. He is going for an angiogram with Dr. Andree Elk next Monday. We are using polymen and offloading in a forefoot offloading boot. Objective Constitutional Sitting or standing Blood Pressure is within target range for patient.. Pulse regular and within target range for patient.Marland Kitchen Respirations regular, non-labored and within target range.. Temperature is normal and within the target range for the patient.Marland Kitchen Appears in no distress. Vitals Time Taken: 12:50 PM, Height: 69 in, Source: Stated,  Weight: 210 lbs, Source: Stated, BMI: 31, Temperature: 98.2 F, Pulse: 77 bpm, Respiratory Rate: 18 breaths/min, Blood Pressure: 129/84 mmHg, Capillary Blood Glucose: 124 mg/dl. General Notes: glucose per pt report this am Cardiovascular The pulses are not palpable on the left although his foot is reasonably warm including his first toe. General Notes: Wound exam; no purulent drainage but a fibrinous surface with almost 0.55 cm of undermining. No debridement. I am worried that there is not a viable surface over the fifth MTP. Integumentary (Hair, Skin) Wound #5 status is Open. Original cause of wound was Gradually Appeared. The wound is located on the Left Metatarsal head fifth. The wound measures 0.8cm length x 0.8cm width x 0.4cm depth; 0.503cm^2 area and 0.201cm^3 volume. There is Fat Layer (Subcutaneous Tissue) Exposed exposed. There is no tunneling noted, however, there is undermining starting at 5:00 and ending at 8:00 with a maximum distance of 0.4cm. There is a small amount of serosanguineous drainage noted. The wound margin is epibole.  There is small (1-33%) red granulation within the wound bed. There is a large (67-100%) amount of necrotic tissue within the wound bed including Adherent Slough. Assessment Active Problems ICD-10 Type 2 diabetes mellitus with foot ulcer Type 2 diabetes mellitus with diabetic peripheral angiopathy without gangrene Non-pressure chronic ulcer of other part of left foot limited to breakdown of skin Cellulitis of left lower limb Plan Follow-up Appointments: Return Appointment in 1 week. Dressing Change Frequency: Wound #5 Left Metatarsal head fifth: Change Dressing every other day. Wound Cleansing: Wound #5 Left Metatarsal head fifth: May shower and wash wound with soap and water. Primary Wound Dressing: Wound #5 Left Metatarsal head fifth: Polymem Silver Other: - pad right great toe for protection Secondary Dressing: Kerlix/Rolled Gauze Dry  Gauze Off-Loading: Other: - Fore foot off loader 1. I continued with polymen Ag until we get an attempted angiography. 2. This wound is not making any progress. There may not be a viable surface over bone here although there is clearly a surface 3. If blood flow can be augmented, would wonder about an advanced treatment product in a total contact cast to see if we can stimulate any closure here. Right now I am not optimistic Electronic Signature(s) Signed: 12/23/2019 6:00:34 PM By: [name] MD Entered By: [name] on 12/23/2019 13:27:22 -------------------------------------------------------------------------------- SuperBill Details Patient Name: Date of Service: [name], [name] 12/23/2019 Medical Record [id number] Patient Account Number: [id number] Date of Birth/Sex: Treating RN: 07-19-1946 (73 y.o. [name]) [name] Primary Care Provider: [name] Other Clinician: Referring Provider: Treating Provider/Extender:Maxene Byington, [name], [name] in Treatment: 21 Diagnosis Coding ICD-10 Codes Code Description E11.621 Type 2 diabetes mellitus with foot ulcer E11.51 Type 2 diabetes mellitus with diabetic peripheral angiopathy without gangrene L97.521 Non-pressure chronic ulcer of other part of left foot limited to breakdown of skin L03.116 Cellulitis of left lower limb Facility Procedures CPT4 Code: [id number] Description: 99213 - WOUND CARE VISIT-LEV 3 EST PT Modifier: Quantity: 1 Physician Procedures CPT4 Code Description: [id number] [id number] - WC PHYS LEVEL 3 - EST PT ICD-10 Diagnosis Description E11.621 Type 2 diabetes mellitus with foot ulcer E11.51 Type 2 diabetes mellitus with diabetic peripheral angiop L97.521 Non-pressure chronic ulcer of other part  of left foot li Modifier: athy without gan mited to breakdo Quantity: 1 grene wn of skin Electronic Signature(s) Signed: 12/23/2019 6:00:34 PM By: [name] MD Entered By: [name] on 12/23/2019  13:27:43

## 2019-12-23 NOTE — Progress Notes (Addendum)
FENTON, CANDEE ([id number]) Visit Report for 12/23/2019 Arrival Information Details Patient Name: Date of Service: Bonnielee Haff 12/23/2019 12:30 PM Medical Record Number: [id number] Patient Account Number: [id number] Date of Birth/Sex: Treating RN: July 30, 1946 (74 y.o. [name] Primary Care Icel Castles: [name] Other Clinician: Referring Rusty Villella: Treating Ronnie Mallette/Extender: [name] in Treatment: 21 Visit Information History Since Last Visit Added or deleted any medications: No Patient Arrived: Ambulatory Any new allergies or adverse reactions: No Arrival Time: 12:49 Had a fall or experienced change in No Accompanied By: spouse activities of daily living that may affect Transfer Assistance: None risk of falls: Patient Identification Verified: Yes Signs or symptoms of abuse/neglect since last visito No Secondary Verification Process Completed: Yes Hospitalized since last visit: No Patient Requires Transmission-Based Precautions: No Implantable device outside of the clinic excluding No Patient Has Alerts: No cellular tissue based products placed in the center since last visit: Has Dressing in Place as Prescribed: Yes Has Footwear/Offloading in Place as Prescribed: Yes Left: Wedge Shoe Pain Present Now: No Electronic Signature(s) Signed: 12/23/2019 6:22:34 PM By: [name] RN, BSN Entered By: [name] on 12/23/2019 12:50:34 -------------------------------------------------------------------------------- Clinic Level of Care Assessment Details Patient Name: Date of Service: [name] 12/23/2019 12:30 PM Medical Record Number: [id number] Patient Account Number: [id number] Date of Birth/Sex: Treating RN: [birth year] (73 y.o. [name] [name] Primary Care Tyneshia Stivers: [name] Other Clinician: Referring Lynton Crescenzo: Treating Stephane Junkins/Extender: [name] in Treatment: 21 Clinic Level of Care  Assessment Items TOOL 4 Quantity Score X- 1 0 Use when only an EandM is performed on FOLLOW-UP visit ASSESSMENTS - Nursing Assessment / Reassessment X- 1 10 Reassessment of Co-morbidities (includes updates in patient status) X- 1 5 Reassessment of Adherence to Treatment Plan ASSESSMENTS - Wound and Skin A ssessment / Reassessment X - Simple Wound Assessment / Reassessment - one wound 1 5 []  - 0 Complex Wound Assessment / Reassessment - multiple wounds []  - 0 Dermatologic / Skin Assessment (not related to wound area) ASSESSMENTS - Focused Assessment []  - 0 Circumferential Edema Measurements - multi extremities []  - 0 Nutritional Assessment / Counseling / Intervention []  - 0 Lower Extremity Assessment (monofilament, tuning fork, pulses) []  - 0 Peripheral Arterial Disease Assessment (using hand held doppler) ASSESSMENTS - Ostomy and/or Continence Assessment and Care []  - 0 Incontinence Assessment and Management []  - 0 Ostomy Care Assessment and Management (repouching, etc.) PROCESS - Coordination of Care X - Simple Patient / Family Education for ongoing care 1 15 []  - 0 Complex (extensive) Patient / Family Education for ongoing care X- 1 10 Staff obtains , Records, T Results / Process Orders est []  - 0 Staff telephones HHA, Nursing Homes / Clarify orders / etc []  - 0 Routine Transfer to another Facility (non-emergent condition) []  - 0 Routine Hospital Admission (non-emergent condition) []  - 0 New Admissions / / Ordering NPWT Apligraf, etc. , []  - 0 Emergency Hospital Admission (emergent condition) X- 1 10 Simple Discharge Coordination []  - 0 Complex (extensive) Discharge Coordination PROCESS - Special Needs []  - 0 Pediatric / Minor Patient Management []  - 0 Isolation Patient Management []  - 0 Hearing / Language / Visual special needs []  - 0 Assessment of Community assistance (transportation, D/C planning, etc.) []  -  0 Additional assistance / Altered mentation []  - 0 Support Surface(s) Assessment (bed, cushion, seat, etc.) INTERVENTIONS - Wound Cleansing / Measurement X - Simple Wound Cleansing - one wound 1 5 []  -  0 Complex Wound Cleansing - multiple wounds X- 1 5 Wound Imaging (photographs - any number of wounds) []  - 0 Wound Tracing (instead of photographs) X- 1 5 Simple Wound Measurement - one wound []  - 0 Complex Wound Measurement - multiple wounds INTERVENTIONS - Wound Dressings X - Small Wound Dressing one or multiple wounds 1 10 []  - 0 Medium Wound Dressing one or multiple wounds []  - 0 Large Wound Dressing one or multiple wounds X- 1 5 Application of Medications - topical []  - 0 Application of Medications - injection INTERVENTIONS - Miscellaneous []  - 0 External ear exam []  - 0 Specimen Collection (cultures, biopsies, blood, body fluids, etc.) []  - 0 Specimen(s) / Culture(s) sent or taken to Lab for analysis []  - 0 Patient Transfer (multiple staff / / Similar devices) []  - 0 Simple Staple / Suture removal (25 or less) []  - 0 Complex Staple / Suture removal (26 or more) []  - 0 Hypo / Hyperglycemic Management (close monitor of Blood Glucose) []  - 0 Ankle / Brachial Index (ABI) - do not check if billed separately X- 1 5 Vital Signs Has the patient been seen at the hospital within the last three years: Yes Total Score: 90 Level Of Care: New/Established - Level 3 Electronic Signature(s) Signed: 12/23/2019 6:06:02 PM By: RN Entered By: on 12/23/2019 13:21:11 -------------------------------------------------------------------------------- Encounter Discharge Information Details Patient Name: Date of Service: , YE 12/23/2019 12:30 PM Medical Record Number: Patient Account Number: Nurse, adult Date of Birth/Sex: Treating RN: [birth year] (74 y.o. Primary Care Garrit Marrow: Other  Clinician: Referring Jahmarion Popoff: Treating Sebastin Perlmutter/Extender: 12/25/2019 in Treatment: 21 Encounter Discharge Information Items Discharge Condition: Stable Ambulatory Status: Ambulatory Discharge Destination: Home Transportation: Private Auto Accompanied By: wife Schedule Follow-up Appointment: Yes Clinical Summary of Care: Patient Declined Electronic Signature(s) Signed: 12/23/2019 6:05:13 PM By: [name] Entered By: 12/25/2019 on 12/23/2019 13:43:56 -------------------------------------------------------------------------------- Lower Extremity Assessment Details Patient Name: Date of Service: Yves Dill YE 12/23/2019 12:30 PM Medical Record Number: [id number] Patient Account Number: [id number] Date of Birth/Sex: Treating RN: 1946-02-02 (74 y.o. [name] Primary Care Bard Haupert: [name] Other Clinician: Referring Larz Mark: Treating Shelbee Apgar/Extender: [name] in Treatment: 21 Edema Assessment Assessed: 12/25/2019: No] Cherylin Mylar: No] Edema: [Left: Ye] [Right: s] Calf Left: Right: Point of Measurement: cm From Medial Instep 40.3 cm cm Ankle Left: Right: Point of Measurement: cm From Medial Instep 30 cm cm Vascular Assessment Pulses: Dorsalis Pedis Palpable: [Left:No] Electronic Signature(s) Signed: 12/23/2019 6:22:34 PM By: 12/25/2019 RN, BSN Entered By: Deniece Ree on 12/23/2019 12:53:58 -------------------------------------------------------------------------------- Multi Wound Chart Details Patient Name: Date of Service: [id number], [id number] YE 12/23/2019 12:30 PM Medical Record Number: [id number] Patient Account Number: [name] Date of Birth/Sex: Treating RN: Oct 26, 1946 (73 y.o. [name]) [name] Primary Care Fae Blossom: 12/25/2019 Other Clinician: Referring Kadeidra Coryell: Treating Johnesha Acheampong/Extender: [name] in Treatment: 21 Vital Signs Height(in): 69 Capillary  Blood Glucose(mg/dl): [name] Weight(lbs): 12/25/2019 Pulse(bpm): 77 Body Mass Index(BMI): 31 Blood Pressure(mmHg): 129/84 Temperature(F): 98.2 Respiratory Rate(breaths/min): 18 Photos: [5:No Photos Left Metatarsal head fifth] [N/A:N/A N/A] Wound Location: [5:Gradually Appeared] [N/A:N/A] Wounding Event: [5:Diabetic Wound/Ulcer of the Lower] [N/A:N/A] Primary Etiology: [5:Extremity Cataracts, Chronic sinus] [N/A:N/A] Comorbid History: [5:problems/congestion, Coronary Artery Disease, Hypertension, Peripheral Arterial Disease, Type II Diabetes, Osteoarthritis, Neuropathy 07/14/2019] [N/A:N/A] Date Acquired: [5:21] [N/A:N/A] Weeks of Treatment: [5:Open] [N/A:N/A] Wound Status: [5:0.8x0.8x0.4] [N/A:N/A] Measurements L x W x D (  cm) [5:0.503] [N/A:N/A] A (cm) : rea [5:0.201] [N/A:N/A] Volume (cm) : [5:-698.40%] [N/A:N/A] % Reduction in A rea: [5:-1446.20%] [N/A:N/A] % Reduction in Volume: [5:5] Starting Position 1 (o'clock): [5:8] Ending Position 1 (o'clock): [5:0.4] Maximum Distance 1 (cm): [5:Yes] [N/A:N/A] Undermining: [5:Grade 1] [N/A:N/A] Classification: [5:Small] [N/A:N/A] Exudate A mount: [5:Serosanguineous] [N/A:N/A] Exudate Type: [5:red, brown] [N/A:N/A] Exudate Color: [5:Epibole] [N/A:N/A] Wound Margin: [5:Small (1-33%)] [N/A:N/A] Granulation A mount: [5:Red] [N/A:N/A] Granulation Quality: [5:Large (67-100%)] [N/A:N/A] Necrotic A mount: [5:Fat Layer (Subcutaneous Tissue)] [N/A:N/A] Exposed Structures: [5:Exposed: Yes Fascia: No Tendon: No Muscle: No Joint: No Bone: No None] [N/A:N/A] Treatment Notes Electronic Signature(s) Signed: 12/23/2019 6:00:34 PM By: Baltazar Najjar MD Signed: 12/23/2019 6:06:02 PM By: [name] RN Entered By: Baltazar Najjar on 12/23/2019 13:23:51 -------------------------------------------------------------------------------- Multi-Disciplinary Care Plan Details Patient Name: Date of Service: Vinnie Level, Yves Dill YE 12/23/2019 12:30 PM Medical Record Number:  [id number] Patient Account Number: [id number] Date of Birth/Sex: Treating RN: 07-06-1946 (73 y.o. [name] Primary Care [place]: [name] Other Clinician: Referring Linsie Lupo: Treating Gilmer Kaminsky/Extender: [name] in Treatment: 21 Active Inactive Wound/Skin Impairment Nursing Diagnoses: Knowledge deficit related to ulceration/compromised skin integrity Goals: Patient/caregiver will verbalize understanding of skin care regimen Date Initiated: 07/29/2019 Target Resolution Date: 01/02/2020 Goal Status: Active Ulcer/skin breakdown will have a volume reduction of 30% by week 4 Date Initiated: 07/29/2019 Date Inactivated: 10/21/2019 Target Resolution Date: 10/10/2019 Goal Status: Unmet Unmet Reason: comorbities. blood flow Ulcer/skin breakdown will have a volume reduction of 50% by week 8 Date Initiated: 10/21/2019 Date Inactivated: 12/02/2019 Target Resolution Date: 11/21/2019 Goal Status: Unmet Unmet Reason: comorbities Ulcer/skin breakdown will have a volume reduction of 80% by week 12 Date Initiated: 12/02/2019 Target Resolution Date: 01/02/2020 Goal Status: Active Interventions: Assess patient/caregiver ability to obtain necessary supplies Assess patient/caregiver ability to perform ulcer/skin care regimen upon admission and as needed Assess ulceration(s) every visit Notes: Electronic Signature(s) Signed: 12/23/2019 6:06:02 PM By: [name] RN Entered By: [name] on 12/23/2019 13:14:58 -------------------------------------------------------------------------------- Pain Assessment Details Patient Name: Date of Service: [name] 12/23/2019 12:30 PM Medical Record Number: [id number] Patient Account Number: [id number] Date of Birth/Sex: Treating RN: August 07, 1946 (74 y.o. [name] Primary Care Jeovanni Heuring: [name] Other Clinician: Referring Kysen Wetherington: Treating Jaylene Schrom/Extender: [name] in  Treatment: 21 Active Problems Location of Pain Severity and Description of Pain Patient Has Paino Yes Site Locations Pain Location: Generalized Pain With Dressing Change: No Duration of the Pain. Constant / Intermittento Intermittent Rate the pain. Current Pain Level: 0 Worst Pain Level: 4 Least Pain Level: 0 Character of Pain Describe the Pain: Throbbing Pain Management and Medication Current Pain Management: Other: time Is the Current Pain Management Adequate: Adequate How does your wound impact your activities of daily livingo Sleep: No Bathing: No Appetite: No Relationship With Others: No Bladder Continence: No Emotions: No Bowel Continence: No Work: No Toileting: No Drive: No Dressing: No Hobbies: No Electronic Signature(s) Signed: 12/23/2019 6:22:34 PM By: [name] RN, BSN Entered By: [name] on 12/23/2019 12:51:58 -------------------------------------------------------------------------------- Patient/Caregiver Education Details Patient Name: Date of Service: [name] 2/23/2021andnbsp12:30 PM Medical Record Number: [id number] Patient Account Number: [id number] Date of Birth/Gender: Treating RN: Dec 31, 1945 (73 y.o. [name] Primary Care Physician: [name] Other Clinician: Referring Physician: Treating Physician/Extender: [name] in Treatment: 21 Education Assessment Education Provided To: Patient Education Topics Provided Wound/Skin Impairment: Methods: Explain/Verbal Responses: State content correctly Electronic Signature(s) Signed: 12/23/2019 6:06:02 PM By: [name]  RN Entered By: [name] on 12/23/2019 13:15:13 -------------------------------------------------------------------------------- Wound Assessment Details Patient Name: Date of Service: Lynnda Shields 12/23/2019 12:30 PM Medical Record Number: [id number] Patient Account Number: [id number] Date of Birth/Sex: Treating  RN: [birth year] (73 y.o. [name]) [name] Primary Care Noble Cicalese: [name] Other Clinician: Referring Michaline Kindig: Treating Kelsi Benham/Extender: Leonette Nutting in Treatment: 21 Wound Status Wound Number: 5 Primary Diabetic Wound/Ulcer of the Lower Extremity Etiology: Wound Location: Left Metatarsal head fifth Wound Open Wounding Event: Gradually Appeared Status: Date Acquired: 07/14/2019 Comorbid Cataracts, Chronic sinus problems/congestion, Coronary Artery Weeks Of Treatment: 21 History: Disease, Hypertension, Peripheral Arterial Disease, Type II Clustered Wound: No Diabetes, Osteoarthritis, Neuropathy Photos Wound Measurements Length: (cm) 0.8 Width: (cm) 0.8 Depth: (cm) 0.4 Area: (cm) 0.503 Volume: (cm) 0.201 % Reduction in Area: -698.4% % Reduction in Volume: -1446.2% Epithelialization: None Tunneling: No Undermining: Yes Starting Position (o'clock): 5 Ending Position (o'clock): 8 Maximum Distance: (cm) 0.4 Wound Description Classification: Grade 1 Wound Margin: Epibole Exudate Amount: Small Exudate Type: Serosanguineous Exudate Color: red, brown Foul Odor After Cleansing: No Slough/Fibrino Yes Wound Bed Granulation Amount: Small (1-33%) Exposed Structure Granulation Quality: Red Fascia Exposed: No Necrotic Amount: Large (67-100%) Fat Layer (Subcutaneous Tissue) Exposed: Yes Necrotic Quality: Adherent Slough Tendon Exposed: No Muscle Exposed: No Joint Exposed: No Bone Exposed: No Electronic Signature(s) Signed: 12/24/2019 5:09:01 PM By: Mikeal Hawthorne EMT/HBOT Signed: 04/27/2020 5:26:37 PM By: [name] RN Previous Signature: 12/23/2019 6:22:34 PM Version By: Baruch Gouty RN, BSN Entered By: Mikeal Hawthorne on 12/24/2019 15:58:42 -------------------------------------------------------------------------------- Vitals Details Patient Name: Date of Service: Ileana Roup, Carola Frost YE 12/23/2019 12:30 PM Medical Record Number:  [id number] Patient Account Number: [id number] Date of Birth/Sex: Treating RN: 1946-04-25 (74 y.o. [name] Primary Care Trulee Hamstra: [name] Other Clinician: Referring Elianah Karis: Treating Anajulia Leyendecker/Extender: Leonette Nutting in Treatment: 21 Vital Signs Time Taken: 12:50 Temperature (F): 98.2 Height (in): 69 Pulse (bpm): 77 Source: Stated Respiratory Rate (breaths/min): 18 Weight (lbs): 210 Blood Pressure (mmHg): 129/84 Source: Stated Capillary Blood Glucose (mg/dl): 124 Body Mass Index (BMI): 31 Reference Range: 80 - 120 mg / dl Notes glucose per pt report this am Electronic Signature(s) Signed: 12/23/2019 6:22:34 PM By: Baruch Gouty RN, BSN Entered By: Baruch Gouty on 12/23/2019 12:51:16

## 2019-12-25 NOTE — Progress Notes (Signed)
[name] ([id number]) Visit Report for 11/04/2019 Arrival Information Details Patient Name: Date of Service: [name], [name] 11/04/2019 1:45 PM Medical Record [id number] Patient Account Number: [id number] Date of Birth/Sex: Treating RN: [birth year] (74 y.o. [name], [name] Primary Care Peng Thorstenson: [name] Other Clinician: Referring Derik Fults: Treating Brexley Cutshaw/Extender:[name], [name], [name] in Treatment: 14 Visit Information History Since Last Visit Added or deleted any medications: No Patient Arrived: Ambulatory Any new allergies or adverse reactions: No Arrival Time: 14:07 Had a fall or experienced change in No Accompanied By: wife activities of daily living that may affect Transfer Assistance: None risk of falls: Patient Identification Verified: Yes Signs or symptoms of abuse/neglect since No Secondary Verification Process Yes last visito Completed: Hospitalized since last visit: No Patient Requires Transmission-Based No Implantable device outside of the clinic No Precautions: excluding Patient Has Alerts: No cellular tissue based products placed in the center since last visit: Has Dressing in Place as Prescribed: Yes Has Footwear/Offloading in Place as Yes Prescribed: Left: Total Contact Cast Pain Present Now: No Electronic Signature(s) Signed: 12/25/2019 2:23:51 PM By: Zandra Abts RN, BSN Entered By: Zandra Abts on 11/04/2019 14:07:49 -------------------------------------------------------------------------------- Encounter Discharge Information Details Patient Name: Date of Service: [name] 11/04/2019 1:45 PM Medical Record [id number] Patient Account Number: [id number] Date of Birth/Sex: Treating RN: October 11, 1946 (74 y.o. [name] Primary Care Ariann Khaimov: [name] Other Clinician: Referring Qunisha Bryk: Treating Aiza Vollrath/Extender:[name], [name], [name] in Treatment: 14 Encounter Discharge  Information Items Discharge Condition: Stable Ambulatory Status: Ambulatory Discharge Destination: Home Transportation: Private Auto Accompanied By: wife Schedule Follow-up Appointment: Yes Clinical Summary of Care: Patient Declined Electronic Signature(s) Signed: 11/04/2019 5:45:44 PM By: Cherylin Mylar Entered By: Cherylin Mylar on 11/04/2019 16:21:49 -------------------------------------------------------------------------------- Lower Extremity Assessment Details Patient Name: Date of Service: LEWI, DROST 11/04/2019 1:45 PM Medical Record [id number] Patient Account Number: [id number] Date of Birth/Sex: Treating RN: 10-Jun-1946 (73 y.o. [name] Primary Care Detrich Rakestraw: [name] Other Clinician: Referring Keavon Sensing: Treating Chloee Tena/Extender:[name], [name], [name] in Treatment: 14 Edema Assessment Assessed: [Left: No] [Right: No] Edema: [Left: Ye] [Right: s] Calf Left: Right: Point of Measurement: cm From Medial Instep 39 cm cm Ankle Left: Right: Point of Measurement: cm From Medial Instep 29 cm cm Vascular Assessment Pulses: Dorsalis Pedis Palpable: [Left:Yes] Electronic Signature(s) Signed: 12/25/2019 2:23:51 PM By: Zandra Abts RN, BSN Entered By: Zandra Abts on 11/04/2019 14:08:37 -------------------------------------------------------------------------------- Multi Wound Chart Details Patient Name: Date of Service: [name] 11/04/2019 1:45 PM Medical Record [id number] Patient Account Number: [id number] Date of Birth/Sex: Treating RN: [birth year] (73 y.o. 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] Comorbid History: [5:Cataracts, Chronic sinus N/A problems/congestion, Coronary Artery Disease, Hypertension, Peripheral Arterial Disease, Type II Diabetes, Osteoarthritis, Neuropathy] Date Acquired: [5:07/14/2019] [N/A:N/A] Weeks of Treatment: [5:14] [N/A:N/A] Wound Status: [5:Open] [N/A:N/A] Measurements L x W x D 0.4x0.5x0.2 [N/A:N/A] (cm) Area (cm) : [5:0.157] [N/A:N/A] Volume (cm) : [5:0.031] [N/A:N/A] % Reduction in Area: [5:-149.20%] [N/A:N/A] % Reduction in Volume: -138.50% [N/A:N/A] Starting Position 1 12 (o'clock): Ending Position 1 [5:12] (o'clock): Maximum Distance 1 [5:0.4] (cm): Undermining: [5:Yes] [N/A:N/A] Classification: [5:Grade 1] [N/A:N/A] Exudate Amount: [5:Medium] [N/A:N/A] Exudate Type: [5:Serosanguineous] [  N/A:N/A] Exudate Color: [5:red, brown] [N/A:N/A] Wound Margin: [5:Thickened] [N/A:N/A] Granulation Amount: [5:Medium (34-66%)] [N/A:N/A] Granulation Quality: [5:Pink, Pale] [N/A:N/A] Necrotic Amount: [5:Medium (34-66%)] [N/A:N/A] Exposed Structures: [5:Fat Layer (Subcutaneous Tissue) Exposed: Yes Fascia: No Tendon: No Muscle: No Joint: No Bone: No] [N/A:N/A] Epithelialization: [5:Medium (34-66%) Total Contact Cast] [N/A:N/A N/A] Treatment Notes Electronic Signature(s) Signed: 11/04/2019 5:08:58 PM By: [name] RN Signed: 11/04/2019 5:30:44 PM By: [name] MD Entered By: [name] on 11/04/2019 14:56:46 -------------------------------------------------------------------------------- Multi-Disciplinary Care Plan Details Patient Name: Date of Service: [name] 11/04/2019 1:45 PM Medical Record [id number] Patient Account Number: [id number] Date of Birth/Sex: Treating RN: 1945-11-27 (73  y.o. [name] Primary Care Suly Vukelich: [name] Other Clinician: Referring Floride Hutmacher: Treating Makenli Derstine/Extender:[name], [name], [name] in Treatment: 14 Active Inactive Wound/Skin Impairment Nursing Diagnoses: Knowledge deficit related to ulceration/compromised skin integrity Goals: Patient/caregiver will verbalize understanding of skin care regimen Date Initiated: 07/29/2019 Target Resolution Date: 11/07/2019 Goal Status: Active Ulcer/skin breakdown will have a volume reduction of 30% by week 4 Date Inactivated: 10/21/2019 Target12/08/2019 Resolution Date Initiated: 07/29/2019 Date: Unmet Reason: comorbities. Goal Status: Unmet blood flow Ulcer/skin breakdown will have a volume reduction of 50% by week 8 Date Initiated: 10/21/2019 Target Resolution Date: 11/21/2019 Goal Status: Active Interventions: Assess patient/caregiver ability to obtain necessary supplies Assess patient/caregiver ability to perform ulcer/skin care regimen upon admission and as needed Assess ulceration(s) every visit Notes: Electronic Signature(s) Signed: 11/04/2019 5:08:58 PM By: [name] RN Entered By: [name] on 11/04/2019 14:37:59 -------------------------------------------------------------------------------- Pain Assessment Details Patient Name: Date of Service: LANARD, ARGUIJO 11/04/2019 1:45 PM Medical Record [id number] Patient Account Number: [id number] Date of Birth/Sex: Treating RN: [birth year] (74 y.o. [name] Primary Care Sharicka Pogorzelski: [name] Other Clinician: Referring Jadarious Dobbins: Treating Inocencia Murtaugh/Extender:[name], [name], [name] in Treatment: 14 Active Problems Location of Pain Severity and Description of Pain Patient Has Paino No Site Locations Pain Management and Medication Current Pain Management: Electronic Signature(s) Signed: 12/25/2019 2:23:51 PM By: Levan Hurst RN, BSN Entered By: Levan Hurst on 11/04/2019  14:08:29 -------------------------------------------------------------------------------- Patient/Caregiver Education Details Patient Name: Date of Service: [name] 1/5/2021andnbsp1:45 PM Medical Record [id number] Patient Account Number: [id number] Date of Birth/Gender: 06-29-1946 (74 y.o. M) Treating RN: [name] Primary Care Physician: [name] Other Clinician: Referring Physician: Treating Physician/Extender:[name], [name], [name] in Treatment: 14 Education Assessment Education Provided To: Patient Education Topics Provided Wound/Skin Impairment: Methods: Explain/Verbal Responses: State content correctly Electronic Signature(s) Signed: 11/04/2019 5:08:58 PM By: [name] RN Entered By: [name] on 11/04/2019 14:38:14 -------------------------------------------------------------------------------- Wound Assessment Details Patient Name: Date of Service: KHAMARION, BJELLAND 11/04/2019 1:45 PM Medical Record [id number] Patient Account Number: [id number] Date of Birth/Sex: Treating RN: 01/02/1946 (74 y.o. [name] Primary Care Ean Gettel: [name] Other Clinician: Referring Essence Merle: Treating Emit Kuenzel/Extender:[name], [name], [name] in Treatment: 14 Wound Status Wound Number: 5 Primary Diabetic Wound/Ulcer of the Lower Extremity Etiology: Wound Location: Left Metatarsal head fifth Wound Open Wounding Event: Gradually Appeared Status: Date Acquired: 07/14/2019 Comorbid Cataracts, Chronic sinus problems/congestion, Weeks Of Treatment: 14 History: Coronary Artery Disease, Hypertension, Clustered Wound: No Peripheral Arterial Disease, Type II Diabetes, Osteoarthritis, Neuropathy Photos Wound Measurements Length: (cm) 0.4 Width: (cm) 0.5 Depth: (cm) 0.2 Area: (cm) 0.157 Volume: (cm) 0.031 % Reduction in Area: -149.2% % Reduction in Volume: -138.5% Epithelialization: Medium (34-66%) Tunneling:  No Undermining: Yes Starting Position (o'clock): 12 Ending Position (o'clock): 12 Maximum Distance: (cm) 0.4 Wound Description Classification: Grade 1 Foul Odor A Wound Margin: Thickened Slough/Fibr Exudate Amount: Medium Exudate  Type: Serosanguineous Exudate Color: red, brown Wound Bed Granulation Amount: Medium (34-66%) Granulation Quality: Pink, Pale Fascia Expos Necrotic Amount: Medium (34-66%) Fat Layer (S Necrotic Quality: Adherent Slough Tendon Expos Muscle Expos Joint Expose Bone Exposed fter Cleansing: No ino Yes Exposed Structure ed: No ubcutaneous Tissue) Exposed: Yes ed: No ed: No d: No : No Electronic Signature(s) Signed: 11/05/2019 3:43:47 PM By: Benjaman Kindler EMT/HBOT Signed: 12/25/2019 2:23:51 PM By: Zandra Abts RN, BSN Entered By: Benjaman Kindler on 11/05/2019 09:47:04 -------------------------------------------------------------------------------- Vitals Details Patient Name: Date of Service: TRAVONNE, SCHOWALTER 11/04/2019 1:45 PM Medical Record [id number] Patient Account Number: [id number] Date of Birth/Sex: Treating RN: [birth year] (74 y.o. [name] Primary Care Reynolds Kittel: [name] Other Clinician: Referring Zynasia Burklow: Treating Kermitt Harjo/Extender:[name], [name], [name] in Treatment: 14 Vital Signs Time Taken: 14:08 Temperature (F): 98.6 Height (in): 69 Pulse (bpm): 75 Weight (lbs): 210 Respiratory Rate (breaths/min): 18 Body Mass Index (BMI): 31 Blood Pressure (mmHg): 113/57 Capillary Blood Glucose (mg/dl): 941 Reference Range: 80 - 120 mg / dl Notes glucose per pt report Electronic Signature(s) Signed: 12/25/2019 2:23:51 PM By: Zandra Abts RN, BSN Entered By: Zandra Abts on 11/04/2019 14:08:23

## 2019-12-30 ENCOUNTER — Encounter (HOSPITAL_BASED_OUTPATIENT_CLINIC_OR_DEPARTMENT_OTHER): Payer: Medicare Other | Admitting: Internal Medicine

## 2019-12-30 ENCOUNTER — Encounter (HOSPITAL_BASED_OUTPATIENT_CLINIC_OR_DEPARTMENT_OTHER): Payer: Medicare Other | Attending: Internal Medicine | Admitting: Internal Medicine

## 2019-12-30 ENCOUNTER — Other Ambulatory Visit: Payer: Self-pay

## 2019-12-30 DIAGNOSIS — L03116 Cellulitis of left lower limb: Secondary | ICD-10-CM | POA: Diagnosis not present

## 2019-12-30 DIAGNOSIS — T8189XD Other complications of procedures, not elsewhere classified, subsequent encounter: Secondary | ICD-10-CM | POA: Insufficient documentation

## 2019-12-30 DIAGNOSIS — Y835 Amputation of limb(s) as the cause of abnormal reaction of the patient, or of later complication, without mention of misadventure at the time of the procedure: Secondary | ICD-10-CM | POA: Insufficient documentation

## 2019-12-30 DIAGNOSIS — Z89432 Acquired absence of left foot: Secondary | ICD-10-CM | POA: Diagnosis not present

## 2019-12-30 DIAGNOSIS — L97521 Non-pressure chronic ulcer of other part of left foot limited to breakdown of skin: Secondary | ICD-10-CM | POA: Diagnosis present

## 2019-12-30 DIAGNOSIS — E1151 Type 2 diabetes mellitus with diabetic peripheral angiopathy without gangrene: Secondary | ICD-10-CM | POA: Diagnosis not present

## 2019-12-30 DIAGNOSIS — E11621 Type 2 diabetes mellitus with foot ulcer: Secondary | ICD-10-CM | POA: Diagnosis not present

## 2019-12-31 NOTE — Progress Notes (Signed)
[name] ([id number]) Visit Report for 12/30/2019 Debridement Details Patient Name: Date of Service: [name], [name] 12/30/2019 3:00 PM Medical Record [id number] Patient Account Number: [id number] Date of Birth/Sex: Treating RN: Todd 02, 1947 (73 y.o. [name]) [name] Primary Care Provider: [name] Other Clinician: Referring Provider: Treating Provider/Extender:[name], [name], [name] in Treatment: 22 Debridement Performed for Wound #5 Left Metatarsal head fifth Assessment: Performed By: Physician Ricard Dillon., MD Debridement Type: Debridement Severity of Tissue Pre Fat layer exposed Debridement: Level of Consciousness (Pre- Awake and Alert procedure): Pre-procedure Verification/Time Out Taken: Yes - 15:42 Start Time: 15:42 Pain Control: Other : benzociane 20% Total Area Debrided (L x W): 1.7 (cm) x 1.4 (cm) = 2.38 (cm) Tissue and other material Viable, Non-Viable, Slough, Subcutaneous, Skin: Dermis , Skin: Epidermis, Slough debrided: Level: Skin/Subcutaneous Tissue Debridement Description: Excisional Instrument: Curette Bleeding: Moderate Hemostasis Achieved: Pressure End Time: 15:42 Procedural Pain: 0 Post Procedural Pain: 0 Response to Treatment: Procedure was tolerated well Level of Consciousness Awake and Alert (Post-procedure): Post Debridement Measurements of Total Wound Length: (cm) 1.7 Width: (cm) 1.4 Depth: (cm) 0.4 Volume: (cm) 0.748 Character of Wound/Ulcer Post Improved Debridement: Severity of Tissue Post Debridement: Fat layer exposed Post Procedure Diagnosis Same as Pre-procedure Electronic Signature(s) Signed: 12/30/2019 5:14:23 PM By: [name] MD Signed: 12/31/2019 5:16:33 PM By: [name] RN Entered By: [name] on 12/30/2019 16:21:33 -------------------------------------------------------------------------------- HPI Details Patient Name: Date of Service: [name], [name] 12/30/2019 3:00 PM Medical  Record [id number] Patient Account Number: [id number] Date of Birth/Sex: Treating RN: 1946/01/19 (73 y.o. [name] Primary Care Provider: [name] Other Clinician: Referring Provider: Treating Provider/Extender:[name], [name], [name] in Treatment: 22 History of Present Illness HPI Description: 05/18/16; this is a 74year-old diabetic who is a type II diabetic on insulin. The history is that he traumatized his right foot developed a sore sometime in late March. 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 According to the patient and his wife this wound never really healed. He was seen on 2 occasions in the wound care center in [place] and had vascular studies and then was referred urgently to Dr. Bridgett Larsson of vascular surgery. He underwent an angiogram on 05/10/16. Unfortunately nothing really could be done to improve his vascular status. He had a 75-90% stenosis in the midsegment of 1 segment of the posterior femoral artery. He had a patent popliteal, his anterior tibial occluded shortly after takeoff. Perineal had a greater than 90% stenosis posterior tibial is occluded feet had no distal collaterals feed distal aspect of the transmetatarsal amputation site. The patient tells me that he had a prolonged period of Santyl by Dr. Doran Durand was some initial improvement but then this was stopped. I think they're only applying daily dressings/dry dressings. He has not had a recent x-ray of the right foot he did have one before his surgery in April. His wife by the dimensions of the wound/surgical site being followed at home since 4/20. At that point the dimensions were 0.5 x 12 x 0.2 on 7/19 this was 1.8 x 6 x 0.4. He is not currently on any antibiotics. His  hemoglobin A1c in early April was 12.9 at that point he was started on insulin. Apparently his blood sugars are much lower he has an appointment  with Dr. Legrand Como Alteimer of endocrine next week. 05/29/16 x-ray of the area did not show osteomyelitis. I think he probably needs an MRI at this point. His wife is asking about something called"Yireh" cream which is not FDA approved. I have not heard of this. 06/22/16; MRI did not really suggest osteomyelitis. There was minimal marrow edema and enhancement in the stump of the second metatarsal felt to be secondary likely to postoperative change rather than osteomyelitis. The patient has arranged his own consultation with Dr. Andree Elk at [place], apparently their daughter lives in [place] and has some connection here. Any improvement in vascular supply by Dr. Andree Elk would of course be helpful. Dr. Bridgett Larsson did not feel that anything further could be done other than amputation if wound care did not result in healing or if the area deteriorates. 06/26/16; the patient has been to see Dr. Andree Elk at [facility] and had an angiogram. He is going for a procedure on Thursday which will involve catheterization. I'm not sure if this is an anterograde or retrograde approach. He has been using Santyl to the wound 07/10/16; the patient had a repeat angiogram and angioplasty at [facility] by Dr. Brunetta Jeans. His angiogram showed right CFA and profundal widely patent. The right as of a.m. popliteal artery were widely patent the right anterior tibial was occluded proximally and reconstitutes at the ankle. Peroneal artery was patent to the foot. Posterior tibial artery was occluded. The patient had angioplasty of the anterior tibial artery.. This was quite successful. He was recommended for Plavix as well as aspirin. 07/17/16; the patient was close to be a nurse visit today however the outer dressing of the Apligraf fell off. Noted drainage. I was asked to see the wound. The patient is noted an odor  however his wife had noted that. Drainage with Apligraf not necessarily a bad thing. He has not been systemically unwell 07/24/16; we are still have an issue with drainage of this wound. In spite of this I applied his second Apligraf. Medially the area still is probing to bone. 08/07/16; Apligraf reapplied in general wound looks improved. 08/21/16 Apligraf #4. Wound looks much better 09/04/16 patientt's wound again today continues to appear to improve with the application of the Apligraf's. He notes no increased discomfort or concerns at this point in time. 09/18/16; the patient returns today 2 weeks after his fifth application of Apligraf. Predictably three quarters of the width of this wound has healed. The deep area that probe to bone medially is still open. The patient asked how much out-of-pocket dollars would be for additional Apligraf's. 09/25/16; now using Hydrofera Blue. He has completed 5 Apligraf applications with considerable improvement in this deep open transmetatarsal amputation site. His wound is now a triangular-shaped wound on the medial aspect. At roughly 12 to 2:00 this probes another centimeter but as opposed to in the past this does not probe to bone. The patient has been seen at [facility] by Dr. Zenia Resides. He is not planning to do any more revascularization unless the wound stalls or worsens per the patient 10/02/16; 0.7 x 0.8 x 0.8. Unfortunately although the wound looks stable to improved. There is now easily probable bone. This hasn't been present for several weeks. Patient is not otherwise symptomatic he is not experiencing any pain. I did a culture of the wound bed 10/09/16. Deterioration last week. Culture grew MRSA and although there is improvement here with doxycycline prescribed over the phone I'm going to try to get him linezolid 600 twice a  day for 10 days today. 10/16/16; he is completing a weeks worth of linezolid and still has 3 more days to go. Small triangular-shaped  open area with some degree of undermining. There is still palpable bone with a curet. Overall the area appears better than last week 10/20/16 he has completed the linezolid still having some nausea and vomiting but no diarrhea. He has exposed bone this week which is a deterioration. 10/27/16 patient now has a small but probing wound down to bone. Culture of this bone that I did last week showed a few methicillin-resistant staph aureus. I have little doubt that this represents acute/subacute osteomyelitis. The patient is currently on Doxy which I will continue he also completed 10 days of linezolid. We are now in a difficult situation with this patient's foot after considerable discussion we will send him back to see Dr. Doran Durand for a surgical opinion of this I'm also going to try to arrange a infectious disease consult at [facility] hopefully week and get this prior to her usual 4-6 weeks we having [place]. The patient clearly is going to need 6 weeks of IV vancomycin. If we cannot arrange this expediently I'll have to consider ordering this myself through a home infusion company 11/03/16; the patient now has a small in terms of circumference but probing wound. No bone palpable today. The patient remains on doxycycline 100 twice a day which should support him until he sees infectious disease at [facility] next week the following week on Wednesday I believe he has an appointment with Dr. Andree Elk at [facility] who is his vascular cardiologist. Finally he has an appointment with Dr. Doran Durand on 11/22/16 we have been using silver alginate. The patient's wife states they are having trouble getting this through [facility] 11/13/16; the patient was seen by infectious disease at [facility] in the 11th PICC line placed in preparation for IV antibiotics. A tummy he has not going to get IV vancomycin o Ceftaroline. They've also ordered an MRI. Patient has a follow-up with Dr. Doran Durand on 11/22/16 and Dr. Andree Elk at [facility]  tomorrow 11/23/16 the patient is on daptomycin as directed by infectious disease at [facility]. He is also been back to see Dr. Andree Elk at [facility]. He underwent a repeat arteriogram. He had a successful PTA of the right anterior tibial artery. He is on dual antiplatelete treatment with Plavix and aspirin. Finally he had the MRI of his foot in [place]. This showed cellulitis about the foot worse distally edema and enhancement in the reminiscent of the second metatarsal was consistent with osteomyelitis therefore what I was assuming to be the first metatarsal may be actually the second. He also has a fluid collection deep to the calcaneus at the level of the calcaneal spur which could be an abscess or due to adventitial bursitis. He had a small tear in his Achilles 11/30/16; the patient continues on daptomycin as directed by infectious disease at [facility]. He is been revascularized by Dr. Andree Elk at [facility] in [place]. He has been to see Gretta Arab who was the orthopedic surgeon who did his original amputation. I have not seen his not however per the patient's wife he did not offer another surgical local surgical prodecure to remove involved bone. He verbalized his usual disbelief in not just hyperbarics but any medical therapy for this condition(osteomyelitis). I discussed this in detail with the patient today including answering the question about a BKA definitively "curing" the current condition. 12/07/16; the patient continues on daptomycin as  directed by infectious disease at [facility]. This is directed at the MRSA that we cultured from his bone debridement from 12/22. Lab work today shows a white count of 8.7 hemoglobin of 10.5 which is microcytic and hypochromic differential count shows a slightly elevated monocyte count at 1.2 eosinophilic count of 0.6. His creatinine is 1.11 sedimentation rate apparently is gone from 35-34 now 40. I explained was wife I don't think this represents a trend. His  total CK is 48 12/14/16- patient is here for follow-up evaluation of his right TMA site. He continues to receive IV daptomycin per infectious disease. His serum inflammatory markers remain elevated. He complains of intermittent pain to the medial aspect of the TMA site with intermittent erythema. He voices no complaints or concerns regarding hyperbaric therapy. Overall he and his wife are expressing a frustration and discouragement regarrding the length of time of treatment. 12/21/16; small open wound at roughly the first or second metatarsal metatarsalphalyngeal joint reminiscence of his transmetatarsal amputation site he continues to receive IV daptomycin per infectious disease at [facility]. He will finish these a week tomorrow. He has lab work which I been copied on. His white count is 10.8 hemoglobin 8.9 MCV is low at 75., MCH low at 24.5 platelet count slightly elevated at 626. Differential count shows 70% neutrophils 10% monocytes and 10% eosinophils. His comprehensive metabolic panel shows a slightly low sodium at 133 albumin low at 3.1 total CK is normal at 42 sedimentation rate is much higher at 82. He has had iron studies that show a serum iron of 15 and iron binding capacity of 238 and iron saturation of 6. This is suggestive of iron deficiency. B12 and folate were normal ferritin at 113 The patient tells me that he is not eating well and he has lost weight. He feels episodically nauseated. He is coughing and gagging on mucus which she thinks is sinusitis. He has an appointment with his primary doctor at 5:00 this afternoon in [facility] 01/02/17; the patient developed a subacute pneumonitis. He was admitted to [facility] after a CT scan showed an extensive interstitial pneumonitis [I have not yet seen this]. 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 He has since been discharged. He  is not on oxygen. He feels fatigued and very short of breath with exertion. At [facility] the wound care nurse there felt that his wound was healed. He did complete his daptomycin and has follow-up with infectious disease on Friday. X-rays I did before he went to [facility] still suggested residual osteomyelitis in the anterior aspect of the second must metatarsal head. I'm not sure I would've expected any different. There was no other findings. I actually think I did this because of erythema over the first metatarsal head reminiscent 01/11/17; the patient has eosinophilic pneumonitis. He has been reviewed by pulmonology and given clearance for hyperbarics at least that's what his wife says. I'll need to see if there is note in care everywhere. Apparently the prognosis for improvement of daptomycin induced eosinophilic granulocyte is is 3 months without steroids. In the meantime infectious disease has placed him on doxycycline until the wound is closed. He is still is lost a lot of weight and his blood sugars are running in the mid 60s to low 80s fasting and at all times during the day 01/18/17; he has had adjustments in his insulin apparently his blood sugars in the morning or over  100. He wants to restart his hyperbaric treatment we'll do this at 1:00. 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 I think there is good reason to complete his treatments in order to give him the best chance of maintaining a healed status and these DFU 3 wounds with MRSA infection in the bone 02/15/17; the patient was seen today in conjunction with HBO. He completed hyperbaric oxygen today. The open area on his transmetatarsal site has remained closed. There was an area of erythema when I saw him earlier in the week on the posterior heel although that is resolved as of today as well. He has been using a cam walker. This is a patient who came  to [place] after a transmetatarsal amputation that was necrotic and dehisced. He required revascularization percutaneously on 2 different occasions by Dr. Andree Elk of invasive cardiology at [facility]. 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 He went to [facility] infectious disease and had IV daptomycin for 5 weeks before developing eosinophilic pneumonitis and requiring an admission to hospital/ICU. He made a good recovery and is continued on doxycycline since. As mentioned his foot is closed now. He has a follow-up with Dr. Andree Elk tomorrow. He is going to [employer] on Monday for a custom-made shoe READMISSION Last visit Dr. Andree Elk [facility] 02/16/17 1. Critical limb ischemia of the RLE: s/p transmetatarsal amputation of the RLE, now completely healed. s/p right ATA percutaneous revascularization procedure x2, most recently 11/20/2016 s/p PTA of right AT 100% to less than 20% with a 2.5 x 200 balloon. He does have some residual osteomyelitis, but given the wound is closed, the orthopedist has recommended to follow. He has a fitting for a special shoe coming up next week. He has completed a course of daptomycin and doxycycline and he has been discharged by ID. He has completed a course of hyperbaric therapy. Continue Continue medical management with aspirin, Plavix and statin therapy. We will discuss ongoing Plavix therapy at follow-up in 6 months. 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 Given no symptoms, we will conservatively manage. He doesn't want to do any more invasive studies at this time, which is reasonable. The patient arrives today out of 2 concerns both on the right transmetatarsal site. 1 at the level of the reminiscent fifth metatarsal head and the other at roughly  the first or second. Both of these look like dark subcutaneous discoloration probably subdermal bleeding. He is recently obtained new adaptive footwear for the right foot. He also has a callus on the left fifth dorsal toe however he follows with podiatry for this and I don't think this is any issue. ABIs in this clinic today were 0.66 on the right and 1.06 on the left. On entrance into our clinic initially this was 0.95 and 0.92. He does not describe current claudication. They're going away on a cruise in 8 weeks and I think are trying to do the month is much as they can proactively. They follow with podiatry and have an appointment with Dr. Andree Elk in October READMISSION 06/25/18 This is a patient that we have not seen in almost a year. He is a type II diabetic with known PAD. He is followed by Dr. Andree Elk of interventional cardiology at [facility] in [place]. He is required revascularization for significant PAD. When we first saw him he required a transmetatarsal amputation. He had underlying osteomyelitis with a nonhealing surgical wound. This eventually closed with wound care, IV antibiotics and hyperbaric oxygen. They tell me that he has a modified shoe and he is very active walking up to 4 miles a day. He is followed by Dr. Geroge Baseman of podiatry. His wife states that he underwent a removal of callus over this site on July 9. She felt there may be drainage from this site after that although she could never really determined and there was callus buildup again. On 06/01/18 there was pressure bleeding through the overlying callus. he was given a prescription for 7 days of Bactrim. Fortuitously he has an appointment with Dr. Andree Elk on 06/04/18 and he immediately underwent revascularization of the right leg although I have not had a chance to review these records in care everywhere. This was apparently done through anterior and retrograde access. On 06/06/18 he had another debridement by Dr. Bernette Mayers. Vitamin  soaking with Epsom salts for 20 minutes and applying calcium alginate. The original trans-met was on April 2017 I believe by Dr. Doran Durand. His ABI in our clinic was noncompressible today. 07/02/18; x-ray I ordered last week was negative for osteomyelitis. Swab culture was also negative. He is going to require an MRI which I have ordered today. 07/09/18; surprisingly the MRI of the foot that I ordered did not show osteomyelitis. He did suggest the possibility of cellulitis. For this reason I'll go ahead and give him a 10 day course of doxycycline. Although the previous culture of this area was negative 07/16/18; it arrives with the wound looking much the same. Roughly the same depth. He has thick subcutaneous tissue around the wound orifice but this still has roughly the same depth. Been using silver alginate. We applied Oasis #1 today 07/23/2018; still having to remove a lot of callus and thick subcutaneous tissue to actually define the wound here. Most of this seems to have closed down yet he has a comma shaped divot over the top of the area that still I think is open. We applied Oasis #2 His wife expressed concern about the tip of his left great toe. This almost looks like a small blister. She also showed it today to her podiatrist Dr. Geroge Baseman who did not think this was anything serious. I am not sure is anything serious either however I think it bears some watching. He is not in any pain however he is insensate 07/30/2018;; still on a lot of nonviable tissue over the surface of the wound however cleaning this up reveals a more substantial wound orifice but less of a probing wound depth. This is not probed to bone. There is no evidence of infection The wife is still concerned about a non-open area on the tip of his left great toe. Almost feels like a bony outgrowth. She had previously showed this to podiatry. I do not think this is a blister. A friction area would be possible although he is really not  walking according to his wife. He had a small skin tag on his right buttock but no open wound here either 08/06/2018; we applied a third Oasis last week. Unfortunately there is really no improvement. Still requiring extensive debridement to expose the wound bed from a horizontal slitlike depression. I still have not been able to get this to fill in properly. On the positive side there is now no probable  bone from when he first came into the facility. I changed him to silver alginate today after a reasonably aggressive debridement 08/13/2018; once again the patient comes in with skin and subcutaneous tissue closing over the small probing area with the underlying cavity of the wound on the right TMA site. I applied silver alginate to this last week. Prior to that we used Oasis x3 still not able to get this area granulating. He does not have a probing area of the bone which is an improvement from when I spur started working on this and an MRI did not suggest osteomyelitis. I do not see evidence of infection here but I am increasingly concerned about why I cannot get this area to granulate. Each time I debrided this is looks like this is simply a matter of getting granulation to fill in the hole and then getting epithelialization. This does not seem to happen Also I sent him back to see podiatry Dr. Earleen Newport about the what felt to be bony outgrowth on the tip of his left great toe. Apparently after heel he left the clinic last week or the next day he developed a blood blister. He did see Dr. Earleen Newport. He went on to have a debridement of the medial nail cuticle he now has an open area here as well as some denuded skin. They did an x-ray apparently does have a bony outgrowth or spur but I am not able to look at this. They are using topical antibiotics apparently there was some suggested he use Santyl. Patient's wife was anxious for my opinion of this 08/20/2018; we are able to keep the wound open this time  instead of the thick subcutaneous tissue closing over the top of it however unfortunately once again this probes to bone. I do not see any evidence of infection and previous MRI did not show osteomyelitis. I elected to go back to the Oasis to see if we can stimulate some granulation. Last saw his vascular interventional cardiologist Dr. Andree Elk at the beginning of August and he had a repeat procedure. Nevertheless I wonder how much blood flow he has down to this area With regards to the left first toe he is seeing Dr. Earleen Newport next week. They are applying Bactroban to this area. 08/27/2018 Once again he comes in with thick eschar and subcutaneous tissue over the top of the small probing hole. This does not appear to go down to bone but it still has roughly the same depth. I reapplied Oasis today Over the left great toe there appears to be more of the wound at the tip of his toe than there was last week. This has an eschar on the surface of it. Will change to Santyl. There is seeing podiatry this afternoon 09/03/2018 He comes in today with the area on the transmetatarsal's site with a fair amount of callus, nonviable tissue over the circumference but it was not closed. I removed all of this as well as some subcutaneous debris and reapplied Oasis. There is no exposed bone The area over the tip of the left great toe started off as a nodule of uncertain etiology. He has been followed with podiatry. They have been removing part of the medial nail bed. He has nonviable tissue over the wound he been using Santyl in this area 09/10/18 Unfortunately comes in with neither wound area looking improved. The transmetatarsal amputation site once Again has nonviable debris over the surface requiring debridement. Unfortunately underneath this there is nothing that looks  viable and this once again goes right down to bone. There is no purulent drainage and no erythema. Also the surgical wound from podiatry on the left  first toe has an ischemic-looking eschar over the surface of the tip of the toe I have elected not to attempt candidly debride this His wife as arranged for him to have follow-up noninvasive studies in [place] under the care of Dr. Andree Elk clinic. The question is he has known severe PAD. They had recently seen him in August. He has had revascularizations in both legs within the last 4 or 5 months. He is not complaining of pain and I cannot really get a history of claudication. He has not systemically unwell 09/20/2018 the patient has been to [facility] and been revascularized by Dr. Andree Elk earlier this week. Apparently he was able to open up the anterior tibial artery although I have not actually seen his formal report. He is going for an attempt to revascularize on the left on Monday. He is apparently working with an investigational stent for lower extremity arteries below the knee and he has talked to the patient about placing that on Monday if possible. The patient has been using silver alginate on the transmetatarsal amputation site and Santyl on the left 09/30/2018; patient had his revascularization on the left this apparently included a standard approach as well as a more distal arterial catheterization although I do not have any information on this from Dr. Andree Elk. In fact I do not even see the initial revascularization that he had on the right. I have included the arterial history from Dr. Andree Elk last note however below; ASSESSMENT/PLAN: 1. Hx of Critical limb ischemia bilateral lower extremities, PAD: -s/p transmetatarsal amputation of the RLE. -s/p right ATA percutaneous revascularization procedure x2, most recently 11/20/2016 s/p PTA of right AT 100% to less than 20% with a 2.5 x 200 balloon. 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 -03/21/2018 s/p PTA of  90% left popliteal artery to <10% with a 5x20 cutting balloon, PTA of 90% left peroneal to <20% with a 3x20 balloon, PTA of 100% left AT to <20% with a 2.5x220 balloon. 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 Will plan on the LLE soon after. Risks/benefits of procedure have been discussed and patient has elected to proceed. We have been using endoform to the right TMA amputation site wound and Santyl to the left great toe 10/07/2018; the area on the tip of his left great toe looked better we have been using Santyl here. We continue to have a very difficult probing hole on the right TMA amputation site we have been using endoform. I went on to use his fifth Oasis today 10/14/2018; the tip of the left great toe continues to look better. We have been using Santyl here the surface however is healthy and I think we can change to an alginate. The right TMA has not changed. Once again he has no superficial opening there is callus and thick subcutaneous tissue over the orifice once you remove this there is the probing area that we have been dealing with without too much change. There is no palpable bone I have been placing Oasis here and put Oasis #6 in this today after a  more vigorous debridement 10/21/18; the left great toe still has necrotic surface requiring debridement. The right TMA site hasn't changed in view of the thick callus over the wound bed. With removal of this there is still the opening however this does not appear to have the same depth. Again there is no palpable bone. Oasis was replaced 10/28/2018; patient comes in with both wounds looking worse. The area over the first toe tip is now down to bone. The area over the TMA site is deeper and down to bone clearly with a increase in overall wound area. Equally concerning on the right TMA is the  complete absence of a pulse this week which is a change. We are not even able to Doppler this. On the right he has a noncompressible ABI greater than 1.4 11/04/2018. Both wounds look somewhat worse. X-rays showed no osteomyelitis of the right foot but on the left there was underlying osteomyelitis in the left great toe distal phalanx. I been on the phone to Dr. Andree Elk surface at [facility] in [place] and they are arranging for another angiogram on the right on Monday. I have him on doxycycline for the osteomyelitis in the left great toe for now. Infectious disease may be necessary 1/17; 2-week hiatus. Patient was admitted to hospital at [place]. My understanding is he underwent an angioplasty of the right anterior tibial artery and had stents placed in the left anterior artery and the left tibial peroneal trunk. This was done by Dr. Andree Elk of interventional radiology. There is no major change in either 1 of the wounds. They have been using Aquacel Ag. As far as they are aware no imaging studies were done of the foot which is indeed unfortunate. I had him on doxycycline for 2 weeks since we identified the osteomyelitis in the left great toe by plain x-ray. I have renewed that again today. I am still suspicious about osteomyelitis in the amputation site and would consider doing another MRI to compare with the one done in September. The idea of hyperbaric oxygen certainly comes up for discussion 1/24; no major change in either wound area. I have him on doxycycline for osteomyelitis at the tip of the left great toe. As noted he has been previously and recently revascularized by Dr. Andree Elk at [place]. He is tolerating the doxycycline well. For some reason we do not have an infectious disease consult yet. Culture of drainage from the right foot site last week was negative 1/31; MRI of the right foot did not show osteomyelitis of the right ankle and foot. Notable for a skin ulceration overlying the second  metatarsal stump with generalizing soft tissue edema of the ankle and foot consistent with cellulitis. Noted to have a partial-thickness tear of the Achilles tendon 7.5 cm proximal to the insertion Nothing really new in terms of symptoms. Patient's area on the tip of the left great toe is just about closed although he still has the probing area on the metatarsal amputation site. Appointment with Dr. Linus Salmons of infectious disease next week 2/7; Dr. Novella Olive did not feel that any further antibiotics were necessary he would follow-up in 2 months. The left great toe appears to be closed still some surface callus that I gently looked under high did not see anything open or anything that was threatening to be open. He still has the open area on the mid part of his TMA site using endoform 2/14; left great toe is closed and he is completing his doxycycline as of  last Sunday. He is not on any antibiotics. Unfortunately out of the right foot his wife noticed some subdermal hemorrhage this week. He had been walking 2 miles I had given him permission to do so this is not really a plantar wound. Using endoform to this wound but I changed to silver alginate this week 2/21; left great toe remains closed. Culture last week grew Streptococcus angiosis which I am not really familiar with however it is penicillin sensitive and I am going to put him on Augmentin. Not much change in the wound on the right foot the deep area is still probing precariously close to bone and the wound on the margin of the TMA is larger. 2/28; left great toe remains closed. He is completing the Augmentin I gave him last week. 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 This is being shown to Dr. Andree Elk at [place] to see if there is anything else that can be done here. He has been using silver alginate strips on the right 3/6; left great toe remains closed. He sees Dr. Andree Elk on Monday. The area on the plantar  aspect of the right foot has the same small orifice with thick callused tissue around this. However this time with removal of the callus tissue the wound is open all the way along the incision line to the end medially. We have been using silver alginate. 3/13; left toe remains closed although the area is callused. He sees Dr. Jacqualyn Posey of podiatry next week. The area on the plantar right foot looked a lot better this week. Culture I did of this was negative we use silver alginate. He is going next week for an attempt at revascularization by Dr. Andree Elk 3/23; left toe remains closed although the area is callused. He will see Dr. Jacqualyn Posey in follow-up. The area on the right plantar foot continues to look surprisingly better over the last 3 visits. We have been using silver alginate. The revascularization he was supposed to have by Dr. Andree Elk at [facility] in [place] has been canceled [facility] procedure] 4/6; the right foot remains closed albeit callused. Podiatry canceled the appointment with regards to the left great toe. He has not seen Dr. Andree Elk at [facility] but thinks that Dr. Andree Elk has "done all he can do". He would be a candidate for the hemostaemix trial Readmission 07/29/2019 Mr. Stann Mainland is a man we know well from at least 3 previous stays in this clinic. He is a type II diabetic with severe PAD followed by Dr. Andree Elk at [facility] in [place]. 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 So far everything in both of these areas has remained closed. About 2 weeks ago he went to see his podiatrist [facility] Dr. Babs Bertin. He had a thick callus on the left fifth metatarsal head that was shaved. He has developed an open wound in this area. They have been offloading this in his diabetic shoes. The patient has not had any more revascularizations by Dr. Andree Elk since the last  time he was here. ABI in our clinic at the posterior tibial on the left was 1.06 10/6; no real change in the area on the plantar met head. Small wound with 2 mm of depth. He has thick skin probably from pressure around the wound. We have been using  silver alginate 10/13; small wound in the left fifth plantar met head. Arrives today with undermining laterally and purulent drainage. Our intake nurse cultured this. His wife stated they noticed a change in color over the last day or 2. He is not systemically unwell 10/19; small wound on the fifth plantar metatarsal head. Culture I did last week showed Staphylococcus lugdunensis. Although this could be a skin contaminant the possibility of a skin and soft tissue infection was there. I did give him empiric doxycycline which should have covered this. We are using silver alginate to the wound. We put him in a total contact cast today. 10/22; small wound on the fifth plantar metatarsal head. He has completed antibiotics. He also has severe PAD which worries me about just about any wound on this man's foot 10/29; small superficial area on the fifth plantar metatarsal head. Measuring slightly smaller. He is not currently on any antibiotics. I been using a total contact cast in this man with very severe PAD but he seems to be tolerating this well 11/5; left plantar fifth metatarsal head. Silver alginate being used under a total contact cast 11/12; wound not much different than last week. Using a #15 scalpel debridement around the wound. Still silver collagen under a total contact cast. He has severe PAD and that may be playing a role in this 11/19; disappointing that the wound is not really changed that much. I think it is come down in overall surface area because originally this was on the lateral part of the fifth metatarsal head however recently it is not really changed. Most of this is filled in but it will not epithelialized there is surface debris on  this which may be mostly related to ischemia. They have an appointment with Dr. Andree Elk on 12/9 09/24/2019 on evaluation today patient appears to be doing somewhat better with regard to the wound on the left fifth metatarsal head. Fortunately there does not appear to be any signs of active infection at this time. No fevers, chills, nausea, vomiting, or diarrhea. The wound does not appear to be completely closed and I do feel like the Hydrofera Blue is helping to some degree although I feel like the cast as well as keeping things from breaking down or getting any larger at least. As far as his wound overview it shows that the overall size of the wound is slightly smaller but really maintaining within the realm of about the same. He had no troubles with the cast which is good news. 12/1; left fifth metatarsal head. Perhaps somewhat more vibrant. We have been using Hydrofera Blue. He sees Dr. Andree Elk at [place] 1 week tomorrow. He be back next week and I hope to have a better idea which way the wound is going however I will not cast him next week in case Dr. Andree Elk wants to reevaluate things in his foot. The left leg is the leg with the stent in place and I wonder whether these are going to have to be reevaluated. 12/8; left fifth metatarsal head. Quite a bit better this week. Only a small open area remains. He sees Dr. Andree Elk at [place] tomorrow. Dr. Andree Elk is previously done his vascular interventions. He has 2 stents in the left leg as I remember things. I therefore will not put him in a total contact cast today. He will be using a forefoot off loader. We will use silver collagen on the wound. We will see him again next week 12/15; left fifth metatarsal  head. He saw Dr. Andree Elk and is scheduled for an angiogram on Thursday. Unfortunately although his wound was a lot better last week it is really deteriorated this week. Small punched-out hole with depth and overhanging tissue. We have been using Hydrofera  Blue 12/22; patient had an 80% stenosis proximal to the stent I believe in the below-knee popliteal artery. This was opened by Dr. Andree Elk. According the patient the stent itself was satisfactory. I have not been able to review this note. 12/29; patient arrives today with the wound about the same size some undermining medially. We have been using Hydrofera Blue under a total contact cast and that is what we will do again today 11/04/2019. Slightly smaller in orifice but about 4 mm undermining almost circumferentially. We have been using Hydrofera Blue. Apparently the Hydrofera Blue did not stay on the wound after we removed the cast. Some minor looking cast irritation on the right lateral 1/12; unfortunately things did not go well this week. Arrives in clinic today with a deeper wound with undermining more concerning a area of pus. Specimen was obtained for culture. We are not going to be able to put him in a cast today. 1/19; purulent material last week which I cultured showed Enterobacter cloacae. He was on ampicillin previously prescribed by his primary doctor which should not have covered this however mysteriously the wound looks somewhat better. There is less depth less erythema certainly no drainage. He will need a course of ciprofloxacin which I will provide today. 1/26; he has completed the ciprofloxacin. I don't think he needs any additional antibiotics. The areas on the left fifth plantar met head. We've been using silver alginate 2/2; comes in today with 0.4 cm of circumferential undermining around a small wound in this area. We have been using silver alginate with a forefoot offloading boot 2/9; again the wound appears larger nonviable surface and with 0.4 cm roughly of circumferential undermining. We have been using silver alginate with a forefoot offloading boot. The wound does not look infected however his wife is quick to point out about swelling on the dorsal foot. 2/16; not much  change. Comes in with a small wound with a nonviable necrotic surface. Again marked undermining that I had totally removed last week. He had a arterial Doppler last week in Dr. Andree Elk office at [facility]. They have not heard these results. They are somewhat frustrated. 2/23; if anything this is worse. Undermining increased depth. Nonviable surface that looks pale. According his wife is TBI on the right was 0.22 although I have not verified this. He is going for an angiogram with Dr. Andree Elk next Monday. We are using polymen and offloading in a forefoot offloading boot. 3/2; the patient was revascularized yesterday by [facility]. He had successful PTA of the left peroneal 60% to less than 20% and successful PTA of the left ATA 100% to less than 20%. He now has a pulse in the left foot Now that he has some additional blood flow there is not a lot of options here. On one hand we want activity to help keep blood flow augmented on the other hand pressure relief is going to be paramount if we have any chance to get this to close. After some discussion with his wife and patient I went ahead and put him in a total contact cast Electronic Signature(s) Signed: 12/30/2019 5:14:23 PM By: [name] MD Entered By: [name] on 12/30/2019 16:23:36 -------------------------------------------------------------------------------- Physical Exam Details Patient Name:  Date of Service: [name], [name] 12/30/2019 3:00 PM Medical Record [id number] Patient Account Number: [id number] Date of Birth/Sex: Treating RN: 02-07-1946 (73 y.o. [name]) [name] Primary Care Provider: [name] Other Clinician: Referring Provider: Treating Provider/Extender:[name], [name], [name] in Treatment: 30 Constitutional Patient is hypertensive.. Pulse regular and within target range for patient.Marland Kitchen Respirations regular, non-labored and within target range.. Temperature is normal and within  the target range for the patient.Marland Kitchen Appears in no distress. Cardiovascular Palpable dorsalis pedis pulse also an improvement. Notes Wound exam; the patient had a nonviable surface on this. I removed it with a #3 curette. There is some bleeding which is an improvement versus the last time we saw him on 2/23. Although there is no exposed bone there is not a lot of tissue over this. Electronic Signature(s) Signed: 12/30/2019 5:14:23 PM By: [name] MD Entered By: [name] on 12/30/2019 16:24:49 -------------------------------------------------------------------------------- Physician Orders Details Patient Name: Date of Service: [name], [name] 12/30/2019 3:00 PM Medical Record [id number] Patient Account Number: [id number] Date of Birth/Sex: Treating RN: [birth year] (73 y.o. [name]) [name] Primary Care Provider: [name] Other Clinician: Referring Provider: Treating Provider/Extender:[name], [name], [name] in Treatment: 20 Verbal / Phone Orders: No Diagnosis Coding ICD-10 Coding Code Description E11.621 Type 2 diabetes mellitus with foot ulcer E11.51 Type 2 diabetes mellitus with diabetic peripheral angiopathy without gangrene L97.521 Non-pressure chronic ulcer of other part of left foot limited to breakdown of skin L03.116 Cellulitis of left lower limb Follow-up Appointments Return Appointment in 1 week. Dressing Change Frequency Wound #5 Left Metatarsal head fifth Do not change entire dressing for one week. Wound Cleansing Wound #5 Left Metatarsal head fifth May shower with protection. Primary Wound Dressing Wound #5 Left Metatarsal head fifth Silver Collagen - moisten with hydrogel Other: - pad right great toe for protection Secondary Dressing Dry Gauze Off-Loading Total Contact Cast to Left Lower Extremity Electronic Signature(s) Signed: 12/30/2019 5:14:23 PM By: [name] MD Signed: 12/31/2019 5:16:33 PM By: [name] RN Entered  By: [name] on 12/30/2019 15:47:04 -------------------------------------------------------------------------------- Problem List Details Patient Name: Date of Service: [name], [name] 12/30/2019 3:00 PM Medical Record [id number] Patient Account Number: [id number] Date of Birth/Sex: Treating RN: [birth year] (73 y.o. Staci Acosta, Morey Hummingbird Primary Care Provider: [name] Other Clinician: Referring Provider: Treating Provider/Extender:[name], [name], [name] in Treatment: 22 Active Problems ICD-10 Evaluated Encounter Code Description Active Date Today Diagnosis E11.621 Type 2 diabetes mellitus with foot ulcer 07/29/2019 No Yes E11.51 Type 2 diabetes mellitus with diabetic peripheral 07/29/2019 No Yes angiopathy without gangrene L97.521 Non-pressure chronic ulcer of other part of left foot 07/29/2019 No Yes limited to breakdown of skin L03.116 Cellulitis of left lower limb 08/12/2019 No Yes Inactive Problems Resolved Problems Electronic Signature(s) Signed: 12/30/2019 5:14:23 PM By: [name] MD Entered By: [name] on 12/30/2019 16:21:04 -------------------------------------------------------------------------------- Progress Note Details Patient Name: Date of Service: [name], [name] 12/30/2019 3:00 PM Medical Record [id number] Patient Account Number: [id number] Date of Birth/Sex: Treating RN: [birth year] (73 y.o. [name] Primary Care Provider: [name] Other Clinician: Referring Provider: Treating Provider/Extender:[name], [name], [name] in Treatment: 22 Subjective History of Present Illness (HPI) 05/18/16; this is a 74year-old diabetic who is a type II diabetic on insulin. The history is that he traumatized his right foot developed a sore sometime in late March. Shortly thereafter he went on a cruise but he had to get off the cruise ship in [place] and fly urgently back to [place] where he was  admitted to [facility]  and ultimately underwent a transmetatarsal amputation by Dr. 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 According to the patient and his wife this wound never really healed. He was seen on 2 occasions in the wound care center in [place] and had vascular studies and then was referred urgently to Dr. Bridgett Larsson of vascular surgery. He underwent an angiogram on 05/10/16. Unfortunately nothing really could be done to improve his vascular status. He had a 75-90% stenosis in the midsegment of 1 segment of the posterior femoral artery. He had a patent popliteal, his anterior tibial occluded shortly after takeoff. Perineal had a greater than 90% stenosis posterior tibial is occluded feet had no distal collaterals feed distal aspect of the transmetatarsal amputation site. The patient tells me that he had a prolonged period of Santyl by Dr. Doran Durand was some initial improvement but then this was stopped. I think they're only applying daily dressings/dry dressings. He has not had a recent x-ray of the right foot he did have one before his surgery in April. His wife by the dimensions of the wound/surgical site being followed at home since 4/20. At that point the dimensions were 0.5 x 12 x 0.2 on 7/19 this was 1.8 x 6 x 0.4. He is not currently on any antibiotics. His hemoglobin A1c in early April was 12.9 at that point he was started on insulin. Apparently his blood sugars are much lower he has an appointment with Dr. Legrand Como Alteimer of endocrine next week. 05/29/16 x-ray of the area did not show osteomyelitis. I think he probably needs an MRI at this point. His wife is asking about something called"Yireh" cream which is not FDA approved. I have not heard of this. 06/22/16; MRI did not really suggest osteomyelitis. There was minimal marrow edema and enhancement in the stump of the second metatarsal felt to be secondary likely to postoperative change rather than osteomyelitis. The patient has arranged his own  consultation with Dr. Andree Elk at [place], apparently their daughter lives in [place] and has some connection here. Any improvement in vascular supply by Dr. Andree Elk would of course be helpful. Dr. Bridgett Larsson did not feel that anything further could be done other than amputation if wound care did not result in healing or if the area deteriorates. 06/26/16; the patient has been to see Dr. Andree Elk at [place] and had an angiogram. He is going for a procedure on Thursday which will involve catheterization. I'm not sure if this is an anterograde or retrograde approach. He has been using Santyl to the wound 07/10/16; the patient had a repeat angiogram and angioplasty at [place] by Dr. Brunetta Jeans. His angiogram showed right CFA and profundal widely patent. The right as of a.m. popliteal artery were widely patent the right anterior tibial was occluded proximally and reconstitutes at the ankle. Peroneal artery was patent to the foot. Posterior tibial artery was occluded. The patient had angioplasty of the anterior tibial artery.. This was quite successful. He was recommended for Plavix as well as aspirin. 07/17/16; the patient was close to be a nurse visit today however the outer dressing of the Apligraf fell off. Noted drainage. I was asked to see the wound. The patient is noted an odor however his wife had noted that. Drainage with Apligraf not necessarily a bad thing. He has not been systemically unwell 07/24/16; we are still have an issue with drainage of this wound. In spite of this I applied his second Apligraf. Medially the area still is probing  to bone. 08/07/16; Apligraf reapplied in general wound looks improved. 08/21/16 Apligraf #4. Wound looks much better 09/04/16 patientt's wound again today continues to appear to improve with the application of the Apligraf's. He notes no increased discomfort or concerns at this point in time. 09/18/16; the patient returns today 2 weeks after his fifth application of Apligraf.  Predictably three quarters of the width of this wound has healed. The deep area that probe to bone medially is still open. The patient asked how much out-of-pocket dollars would be for additional Apligraf's. 09/25/16; now using Hydrofera Blue. He has completed 5 Apligraf applications with considerable improvement in this deep open transmetatarsal amputation site. His wound is now a triangular-shaped wound on the medial aspect. At roughly 12 to 2:00 this probes another centimeter but as opposed to in the past this does not probe to bone. The patient has been seen at [place] by Dr. Zenia Resides. He is not planning to do any more revascularization unless the wound stalls or worsens per the patient 10/02/16; 0.7 x 0.8 x 0.8. Unfortunately although the wound looks stable to improved. There is now easily probable bone. This hasn't been present for several weeks. Patient is not otherwise symptomatic he is not experiencing any pain. I did a culture of the wound bed 10/09/16. Deterioration last week. Culture grew MRSA and although there is improvement here with doxycycline prescribed over the phone I'm going to try to get him linezolid 600 twice a day for 10 days today. 10/16/16; he is completing a weeks worth of linezolid and still has 3 more days to go. Small triangular-shaped open area with some degree of undermining. There is still palpable bone with a curet. Overall the area appears better than last week 10/20/16 he has completed the linezolid still having some nausea and vomiting but no diarrhea. He has exposed bone this week which is a deterioration. 10/27/16 patient now has a small but probing wound down to bone. Culture of this bone that I did last week showed a few methicillin-resistant staph aureus. I have little doubt that this represents acute/subacute osteomyelitis. The patient is currently on Doxy which I will continue he also completed 10 days of linezolid. We are now in a difficult situation with  this patient's foot after considerable discussion we will send him back to see Dr. Doran Durand for a surgical opinion of this I'm also going to try to arrange a infectious disease consult at [facility] hopefully week and get this prior to her usual 4-6 weeks we having [place]. The patient clearly is going to need 6 weeks of IV vancomycin. If we cannot arrange this expediently I'll have to consider ordering this myself through a home infusion company 11/03/16; the patient now has a small in terms of circumference but probing wound. No bone palpable today. The patient remains on doxycycline 100 twice a day which should support him until he sees infectious disease at [facility] next week the following week on Wednesday I believe he has an appointment with Dr. Andree Elk at [facility] who is his vascular cardiologist. Finally he has an appointment with Dr. Doran Durand on 11/22/16 we have been using silver alginate. The patient's wife states they are having trouble getting this through [facility] 11/13/16; the patient was seen by infectious disease at [facility] in the 11th PICC line placed in preparation for IV antibiotics. A tummy he has not going to get IV vancomycin o Ceftaroline. They've also ordered an MRI. Patient has a follow-up with Dr. Doran Durand on 11/22/16  and Dr. Andree Elk at [facility] tomorrow 11/23/16 the patient is on daptomycin as directed by infectious disease at [facility]. He is also been back to see Dr. Andree Elk at [facility]. He underwent a repeat arteriogram. He had a successful PTA of the right anterior tibial artery. He is on dual antiplatelete treatment with Plavix and aspirin. Finally he had the MRI of his foot in [place]. This showed cellulitis about the foot worse distally edema and enhancement in the reminiscent of the second metatarsal was consistent with osteomyelitis therefore what I was assuming to be the first metatarsal may be actually the second. He also has a fluid collection deep to the calcaneus at the  level of the calcaneal spur which could be an abscess or due to adventitial bursitis. He had a small tear in his Achilles 11/30/16; the patient continues on daptomycin as directed by infectious disease at [facility]. He is been revascularized by Dr. Andree Elk at [facility] in [place]. He has been to see Gretta Arab who was the orthopedic surgeon who did his original amputation. I have not seen his not however per the patient's wife he did not offer another surgical local surgical prodecure to remove involved bone. He verbalized his usual disbelief in not just hyperbarics but any medical therapy for this condition(osteomyelitis). I discussed this in detail with the patient today including answering the question about a BKA definitively "curing" the current condition. 12/07/16; the patient continues on daptomycin as directed by infectious disease at [facility]. This is directed at the MRSA that we cultured from his bone debridement from 12/22. Lab work today shows a white count of 8.7 hemoglobin of 10.5 which is microcytic and hypochromic differential count shows a slightly elevated monocyte count at 1.2 eosinophilic count of 0.6. His creatinine is 1.11 sedimentation rate apparently is gone from 35-34 now 40. I explained was wife I don't think this represents a trend. His total CK is 48 12/14/16- patient is here for follow-up evaluation of his right TMA site. He continues to receive IV daptomycin per infectious disease. His serum inflammatory markers remain elevated. He complains of intermittent pain to the medial aspect of the TMA site with intermittent erythema. He voices no complaints or concerns regarding hyperbaric therapy. Overall he and his wife are expressing a frustration and discouragement regarrding the length of time of treatment. 12/21/16; small open wound at roughly the first or second metatarsal metatarsalphalyngeal joint reminiscence of his transmetatarsal amputation site he continues to receive IV  daptomycin per infectious disease at [facility]. He will finish these a week tomorrow. He has lab work which I been copied on. His white count is 10.8 hemoglobin 8.9 MCV is low at 75., MCH low at 24.5 platelet count slightly elevated at 626. Differential count shows 70% neutrophils 10% monocytes and 10% eosinophils. His comprehensive metabolic panel shows a slightly low sodium at 133 albumin low at 3.1 total CK is normal at 42 sedimentation rate is much higher at 82. He has had iron studies that show a serum iron of 15 and iron binding capacity of 238 and iron saturation of 6. This is suggestive of iron deficiency. B12 and folate were normal ferritin at 113 The patient tells me that he is not eating well and he has lost weight. He feels episodically nauseated. He is coughing and gagging on mucus which she thinks is sinusitis. He has an appointment with his primary doctor at 5:00 this afternoon in [facility] 01/02/17; the patient developed a subacute pneumonitis.  He was admitted to [facility] after a CT scan showed an extensive interstitial pneumonitis [I have not yet seen this]. 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 He has since been discharged. He is not on oxygen. He feels fatigued and very short of breath with exertion. At [facility] the wound care nurse there felt that his wound was healed. He did complete his daptomycin and has follow-up with infectious disease on Friday. X-rays I did before he went to [facility] still suggested residual osteomyelitis in the anterior aspect of the second must metatarsal head. I'm not sure I would've expected any different. There was no other findings. I actually think I did this because of erythema over the first metatarsal head reminiscent 01/11/17; the patient has eosinophilic pneumonitis. He has been reviewed by pulmonology and given clearance  for hyperbarics at least that's what his wife says. I'll need to see if there is note in care everywhere. Apparently the prognosis for improvement of daptomycin induced eosinophilic granulocyte is is 3 months without steroids. In the meantime infectious disease has placed him on doxycycline until the wound is closed. He is still is lost a lot of weight and his blood sugars are running in the mid 60s to low 80s fasting and at all times during the day 01/18/17; he has had adjustments in his insulin apparently his blood sugars in the morning or over 100. He wants to restart his hyperbaric treatment we'll do this at 1:00. 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 I think there is good reason to complete his treatments in order to give him the best chance of maintaining a healed status and these DFU 3 wounds with MRSA infection in the bone 02/15/17; the patient was seen today in conjunction with HBO. He completed hyperbaric oxygen today. The open area on his transmetatarsal site has remained closed. There was an area of erythema when I saw him earlier in the week on the posterior heel although that is resolved as of today as well. He has been using a cam walker. This is a patient who came to [place] after a transmetatarsal amputation that was necrotic and dehisced. He required revascularization percutaneously on 2 different occasions by Dr. Andree Elk of invasive cardiology at [facility]. 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 He went to [facility] infectious disease and had IV daptomycin for 5 weeks before developing eosinophilic pneumonitis and requiring an admission to hospital/ICU. He made a good recovery and is continued on doxycycline since. As mentioned his foot is closed now. He has a follow-up with Dr. Andree Elk tomorrow. He is going  to [employer] on Monday for a custom-made shoe READMISSION Last visit Dr. Andree Elk [facility] 02/16/17 1. Critical limb ischemia of the RLE:  s/p transmetatarsal amputation of the RLE, now completely healed.  s/p right ATA percutaneous revascularization procedure x2, most recently 11/20/2016 s/p PTA of right AT 100% to less than 20% with a 2.5 x 200 balloon.  He does have some residual osteomyelitis, but given the wound is closed, the orthopedist has recommended to follow. He has a fitting for a special shoe coming up next week. He has completed a course of daptomycin and doxycycline and he has been discharged by ID. He has completed a course of hyperbaric therapy.  Continue  Continue medical management with aspirin, Plavix and statin therapy. We will discuss ongoing Plavix therapy at follow-up in 6 months.  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 Given no symptoms, we will conservatively manage. He doesn't want to do any more invasive studies at this time, which is reasonable. The patient arrives today out of 2 concerns both on the right transmetatarsal site. 1 at the level of the reminiscent fifth metatarsal head and the other at roughly the first or second. Both of these look like dark subcutaneous discoloration probably subdermal bleeding. He is recently obtained new adaptive footwear for the right foot. He also has a callus on the left fifth dorsal toe however he follows with podiatry for this and I don't think this is any issue. ABIs in this clinic today were 0.66 on the right and 1.06 on the left. On entrance into our clinic initially this was 0.95 and 0.92. He does not describe current claudication. They're going away on a cruise in 8 weeks and I think are trying to do the month is much as they can proactively. They follow with podiatry and have an  appointment with Dr. Andree Elk in October READMISSION 06/25/18 This is a patient that we have not seen in almost a year. He is a type II diabetic with known PAD. He is followed by Dr. Andree Elk of interventional cardiology at [facility] in [place]. He is required revascularization for significant PAD. When we first saw him he required a transmetatarsal amputation. He had underlying osteomyelitis with a nonhealing surgical wound. This eventually closed with wound care, IV antibiotics and hyperbaric oxygen. They tell me that he has a modified shoe and he is very active walking up to 4 miles a day. He is followed by Dr. Geroge Baseman of podiatry. His wife states that he underwent a removal of callus over this site on July 9. She felt there may be drainage from this site after that although she could never really determined and there was callus buildup again. On 06/01/18 there was pressure bleeding through the overlying callus. he was given a prescription for 7 days of Bactrim. Fortuitously he has an appointment with Dr. Andree Elk on 06/04/18 and he immediately underwent revascularization of the right leg although I have not had a chance to review these records in care everywhere. This was apparently done through anterior and retrograde access. On 06/06/18 he had another debridement by Dr. Bernette Mayers. Vitamin soaking with Epsom salts for 20 minutes and applying calcium alginate. The original trans-met was on April 2017 I believe by Dr. Doran Durand. His ABI in our clinic was noncompressible today. 07/02/18; x-ray I ordered last week was negative for osteomyelitis. Swab culture was also negative. He is going to require an MRI which I have ordered today. 07/09/18; surprisingly the MRI of the foot that I ordered did not show osteomyelitis. He did suggest the possibility of cellulitis. For this reason I'll go ahead and give him a 10 day course of doxycycline. Although the previous culture of this area was negative 07/16/18; it arrives  with the wound looking much the same. Roughly the same depth. He has thick subcutaneous tissue around the wound orifice but this still has roughly the same depth. Been using silver alginate. We applied Oasis #1 today 07/23/2018; still having to remove a lot of callus and thick subcutaneous tissue to actually define the wound here. Most  of this seems to have closed down yet he has a comma shaped divot over the top of the area that still I think is open. We applied Oasis #2 His wife expressed concern about the tip of his left great toe. This almost looks like a small blister. She also showed it today to her podiatrist Dr. Geroge Baseman who did not think this was anything serious. I am not sure is anything serious either however I think it bears some watching. He is not in any pain however he is insensate 07/30/2018;; still on a lot of nonviable tissue over the surface of the wound however cleaning this up reveals a more substantial wound orifice but less of a probing wound depth. This is not probed to bone. There is no evidence of infection The wife is still concerned about a non-open area on the tip of his left great toe. Almost feels like a bony outgrowth. She had previously showed this to podiatry. I do not think this is a blister. A friction area would be possible although he is really not walking according to his wife. He had a small skin tag on his right buttock but no open wound here either 08/06/2018; we applied a third Oasis last week. Unfortunately there is really no improvement. Still requiring extensive debridement to expose the wound bed from a horizontal slitlike depression. I still have not been able to get this to fill in properly. On the positive side there is now no probable bone from when he first came into the facility. I changed him to silver alginate today after a reasonably aggressive debridement 08/13/2018; once again the patient comes in with skin and subcutaneous tissue closing  over the small probing area with the underlying cavity of the wound on the right TMA site. I applied silver alginate to this last week. Prior to that we used Oasis x3 still not able to get this area granulating. He does not have a probing area of the bone which is an improvement from when I spur started working on this and an MRI did not suggest osteomyelitis. I do not see evidence of infection here but I am increasingly concerned about why I cannot get this area to granulate. Each time I debrided this is looks like this is simply a matter of getting granulation to fill in the hole and then getting epithelialization. This does not seem to happen Also I sent him back to see podiatry Dr. Earleen Newport about the what felt to be bony outgrowth on the tip of his left great toe. Apparently after heel he left the clinic last week or the next day he developed a blood blister. He did see Dr. Earleen Newport. He went on to have a debridement of the medial nail cuticle he now has an open area here as well as some denuded skin. They did an x-ray apparently does have a bony outgrowth or spur but I am not able to look at this. They are using topical antibiotics apparently there was some suggested he use Santyl. Patient's wife was anxious for my opinion of this 08/20/2018; we are able to keep the wound open this time instead of the thick subcutaneous tissue closing over the top of it however unfortunately once again this probes to bone. I do not see any evidence of infection and previous MRI did not show osteomyelitis. I elected to go back to the Oasis to see if we can stimulate some granulation. Last saw his vascular interventional cardiologist Dr. Andree Elk at  the beginning of August and he had a repeat procedure. Nevertheless I wonder how much blood flow he has down to this area With regards to the left first toe he is seeing Dr. Earleen Newport next week. They are applying Bactroban to this area. 08/27/2018 ooOnce again he comes in  with thick eschar and subcutaneous tissue over the top of the small probing hole. This does not appear to go down to bone but it still has roughly the same depth. I reapplied Oasis today ooOver the left great toe there appears to be more of the wound at the tip of his toe than there was last week. This has an eschar on the surface of it. Will change to Santyl. There is seeing podiatry this afternoon 09/03/2018 ooHe comes in today with the area on the transmetatarsal's site with a fair amount of callus, nonviable tissue over the circumference but it was not closed. I removed all of this as well as some subcutaneous debris and reapplied Oasis. There is no exposed bone ooThe area over the tip of the left great toe started off as a nodule of uncertain etiology. He has been followed with podiatry. They have been removing part of the medial nail bed. He has nonviable tissue over the wound he been using Santyl in this area 09/10/18 ooUnfortunately comes in with neither wound area looking improved. The transmetatarsal amputation site once Again has nonviable debris over the surface requiring debridement. Unfortunately underneath this there is nothing that looks viable and this once again goes right down to bone. There is no purulent drainage and no erythema. ooAlso the surgical wound from podiatry on the left first toe has an ischemic-looking eschar over the surface of the tip of the toe I have elected not to attempt candidly debride this ooHis wife as arranged for him to have follow-up noninvasive studies in [place] under the care of Dr. Andree Elk clinic. The question is he has known severe PAD. They had recently seen him in August. He has had revascularizations in both legs within the last 4 or 5 months. He is not complaining of pain and I cannot really get a history of claudication. He has not systemically unwell 09/20/2018 the patient has been to [facility] and been revascularized by Dr. Andree Elk  earlier this week. Apparently he was able to open up the anterior tibial artery although I have not actually seen his formal report. He is going for an attempt to revascularize on the left on Monday. He is apparently working with an investigational stent for lower extremity arteries below the knee and he has talked to the patient about placing that on Monday if possible. The patient has been using silver alginate on the transmetatarsal amputation site and Santyl on the left 09/30/2018; patient had his revascularization on the left this apparently included a standard approach as well as a more distal arterial catheterization although I do not have any information on this from Dr. Andree Elk. In fact I do not even see the initial revascularization that he had on the right. I have included the arterial history from Dr. Andree Elk last note however below; ASSESSMENT/PLAN: 1. Hx of Critical limb ischemia bilateral lower extremities, PAD: -s/p transmetatarsal amputation of the RLE. -s/p right ATA percutaneous revascularization procedure x2, most recently 11/20/2016 s/p PTA of right AT 100% to less than 20% with a 2.5 x 200 balloon. 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 -03/21/2018 s/p PTA of 90% left popliteal artery to <10% with a 5x20 cutting balloon, PTA of 90% left peroneal to <20% with a 3x20 balloon, PTA of 100% left AT to <20% with a 2.5x220 balloon. 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 Will plan on the LLE soon after. Risks/benefits of procedure have been discussed and patient has elected to proceed. We have been using endoform to the right TMA amputation site wound and Santyl to the left great toe 10/07/2018; the area  on the tip of his left great toe looked better we have been using Santyl here. We continue to have a very difficult probing hole on the right TMA amputation site we have been using endoform. I went on to use his fifth Oasis today 10/14/2018; the tip of the left great toe continues to look better. We have been using Santyl here the surface however is healthy and I think we can change to an alginate. The right TMA has not changed. Once again he has no superficial opening there is callus and thick subcutaneous tissue over the orifice once you remove this there is the probing area that we have been dealing with without too much change. There is no palpable bone I have been placing Oasis here and put Oasis #6 in this today after a more vigorous debridement 10/21/18; the left great toe still has necrotic surface requiring debridement. The right TMA site hasn't changed in view of the thick callus over the wound bed. With removal of this there is still the opening however this does not appear to have the same depth. Again there is no palpable bone. Oasis was replaced 10/28/2018; patient comes in with both wounds looking worse. The area over the first toe tip is now down to bone. The area over the TMA site is deeper and down to bone clearly with a increase in overall wound area. Equally concerning on the right TMA is the complete absence of a pulse this week which is a change. We are not even able to Doppler this. On the right he has a noncompressible ABI greater than 1.4 11/04/2018. Both wounds look somewhat worse. X-rays showed no osteomyelitis of the right foot but on the left there was underlying osteomyelitis in the left great toe distal phalanx. I been on the phone to Dr. Andree Elk surface at [facility] in [place] and they are arranging for another angiogram on the right on Monday. I have him on doxycycline for the osteomyelitis in the left great toe for now. Infectious disease may be necessary 1/17;  2-week hiatus. Patient was admitted to hospital at [place]. My understanding is he underwent an angioplasty of the right anterior tibial artery and had stents placed in the left anterior artery and the left tibial peroneal trunk. This was done by Dr. Andree Elk of interventional radiology. There is no major change in either 1 of the wounds. They have been using Aquacel Ag. As far as they are aware no imaging studies were done of the foot which is indeed unfortunate. I had him on doxycycline for 2 weeks since we identified the osteomyelitis in the left great toe by plain x-ray. I have renewed that again today. I am still suspicious about osteomyelitis in the amputation site and would consider doing another MRI to compare with the one done  in September. The idea of hyperbaric oxygen certainly comes up for discussion 1/24; no major change in either wound area. I have him on doxycycline for osteomyelitis at the tip of the left great toe. As noted he has been previously and recently revascularized by Dr. Andree Elk at [place]. He is tolerating the doxycycline well. For some reason we do not have an infectious disease consult yet. Culture of drainage from the right foot site last week was negative 1/31; MRI of the right foot did not show osteomyelitis of the right ankle and foot. Notable for a skin ulceration overlying the second metatarsal stump with generalizing soft tissue edema of the ankle and foot consistent with cellulitis. Noted to have a partial-thickness tear of the Achilles tendon 7.5 cm proximal to the insertion Nothing really new in terms of symptoms. Patient's area on the tip of the left great toe is just about closed although he still has the probing area on the metatarsal amputation site. Appointment with Dr. Linus Salmons of infectious disease next week 2/7; Dr. Novella Olive did not feel that any further antibiotics were necessary he would follow-up in 2 months. The left great toe appears to be closed still some  surface callus that I gently looked under high did not see anything open or anything that was threatening to be open. He still has the open area on the mid part of his TMA site using endoform 2/14; left great toe is closed and he is completing his doxycycline as of last Sunday. He is not on any antibiotics. Unfortunately out of the right foot his wife noticed some subdermal hemorrhage this week. He had been walking 2 miles I had given him permission to do so this is not really a plantar wound. Using endoform to this wound but I changed to silver alginate this week 2/21; left great toe remains closed. Culture last week grew Streptococcus angiosis which I am not really familiar with however it is penicillin sensitive and I am going to put him on Augmentin. Not much change in the wound on the right foot the deep area is still probing precariously close to bone and the wound on the margin of the TMA is larger. 2/28; left great toe remains closed. He is completing the Augmentin I gave him last week. 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 This is being shown to Dr. Andree Elk at [place] to see if there is anything else that can be done here. He has been using silver alginate strips on the right 3/6; left great toe remains closed. He sees Dr. Andree Elk on Monday. The area on the plantar aspect of the right foot has the same small orifice with thick callused tissue around this. However this time with removal of the callus tissue the wound is open all the way along the incision line to the end medially. We have been using silver alginate. 3/13; left toe remains closed although the area is callused. He sees Dr. Jacqualyn Posey of podiatry next week. The area on the plantar right foot looked a lot better this week. Culture I did of this was negative we use silver alginate. He is going next week for an attempt at revascularization by Dr. Andree Elk 3/23; left toe remains closed although the area  is callused. He will see Dr. Jacqualyn Posey in follow-up. The area on the right plantar foot continues to look surprisingly better over the last 3 visits. We have been using silver alginate. The revascularization he was supposed to  have by Dr. Andree Elk at [facility] in [place] has been canceled [facility] procedure] 4/6; the right foot remains closed albeit callused. Podiatry canceled the appointment with regards to the left great toe. He has not seen Dr. Andree Elk at [facility] but thinks that Dr. Andree Elk has "done all he can do". He would be a candidate for the hemostaemix trial Readmission 07/29/2019 Mr. Stann Mainland is a man we know well from at least 3 previous stays in this clinic. He is a type II diabetic with severe PAD followed by Dr. Andree Elk at [facility] in [place]. 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 So far everything in both of these areas has remained closed. About 2 weeks ago he went to see his podiatrist at [facility] Dr. Babs Bertin. He had a thick callus on the left fifth metatarsal head that was shaved. He has developed an open wound in this area. They have been offloading this in his diabetic shoes. The patient has not had any more revascularizations by Dr. Andree Elk since the last time he was here. ABI in our clinic at the posterior tibial on the left was 1.06 10/6; no real change in the area on the plantar met head. Small wound with 2 mm of depth. He has thick skin probably from pressure around the wound. We have been using silver alginate 10/13; small wound in the left fifth plantar met head. Arrives today with undermining laterally and purulent drainage. Our intake nurse cultured this. His wife stated they noticed a change in color over the last day or 2. He is not systemically unwell 10/19; small wound on the fifth plantar metatarsal head. Culture I did last week  showed Staphylococcus lugdunensis. Although this could be a skin contaminant the possibility of a skin and soft tissue infection was there. I did give him empiric doxycycline which should have covered this. We are using silver alginate to the wound. We put him in a total contact cast today. 10/22; small wound on the fifth plantar metatarsal head. He has completed antibiotics. He also has severe PAD which worries me about just about any wound on this man's foot 10/29; small superficial area on the fifth plantar metatarsal head. Measuring slightly smaller. He is not currently on any antibiotics. I been using a total contact cast in this man with very severe PAD but he seems to be tolerating this well 11/5; left plantar fifth metatarsal head. Silver alginate being used under a total contact cast 11/12; wound not much different than last week. Using a #15 scalpel debridement around the wound. Still silver collagen under a total contact cast. He has severe PAD and that may be playing a role in this 11/19; disappointing that the wound is not really changed that much. I think it is come down in overall surface area because originally this was on the lateral part of the fifth metatarsal head however recently it is not really changed. Most of this is filled in but it will not epithelialized there is surface debris on this which may be mostly related to ischemia. They have an appointment with Dr. Andree Elk on 12/9 09/24/2019 on evaluation today patient appears to be doing somewhat better with regard to the wound on the left fifth metatarsal head. Fortunately there does not appear to be any signs of active infection at this time. No  fevers, chills, nausea, vomiting, or diarrhea. The wound does not appear to be completely closed and I do feel like the Hydrofera Blue is helping to some degree although I feel like the cast as well as keeping things from breaking down or getting any larger at least. As far as his  wound overview it shows that the overall size of the wound is slightly smaller but really maintaining within the realm of about the same. He had no troubles with the cast which is good news. 12/1; left fifth metatarsal head. Perhaps somewhat more vibrant. We have been using Hydrofera Blue. He sees Dr. Andree Elk at [place] 1 week tomorrow. He be back next week and I hope to have a better idea which way the wound is going however I will not cast him next week in case Dr. Andree Elk wants to reevaluate things in his foot. The left leg is the leg with the stent in place and I wonder whether these are going to have to be reevaluated. 12/8; left fifth metatarsal head. Quite a bit better this week. Only a small open area remains. He sees Dr. Andree Elk at [place] tomorrow. Dr. Andree Elk is previously done his vascular interventions. He has 2 stents in the left leg as I remember things. I therefore will not put him in a total contact cast today. He will be using a forefoot off loader. We will use silver collagen on the wound. We will see him again next week 12/15; left fifth metatarsal head. He saw Dr. Andree Elk and is scheduled for an angiogram on Thursday. Unfortunately although his wound was a lot better last week it is really deteriorated this week. Small punched-out hole with depth and overhanging tissue. We have been using Hydrofera Blue 12/22; patient had an 80% stenosis proximal to the stent I believe in the below-knee popliteal artery. This was opened by Dr. Andree Elk. According the patient the stent itself was satisfactory. I have not been able to review this note. 12/29; patient arrives today with the wound about the same size some undermining medially. We have been using Hydrofera Blue under a total contact cast and that is what we will do again today 11/04/2019. Slightly smaller in orifice but about 4 mm undermining almost circumferentially. We have been using Hydrofera Blue. Apparently the Hydrofera Blue did not stay on the  wound after we removed the cast. Some minor looking cast irritation on the right lateral 1/12; unfortunately things did not go well this week. Arrives in clinic today with a deeper wound with undermining more concerning a area of pus. Specimen was obtained for culture. We are not going to be able to put him in a cast today. 1/19; purulent material last week which I cultured showed Enterobacter cloacae. He was on ampicillin previously prescribed by his primary doctor which should not have covered this however mysteriously the wound looks somewhat better. There is less depth less erythema certainly no drainage. He will need a course of ciprofloxacin which I will provide today. 1/26; he has completed the ciprofloxacin. I don't think he needs any additional antibiotics. The areas on the left fifth plantar met head. We've been using silver alginate 2/2; comes in today with 0.4 cm of circumferential undermining around a small wound in this area. We have been using silver alginate with a forefoot offloading boot 2/9; again the wound appears larger nonviable surface and with 0.4 cm roughly of circumferential undermining. We have been using silver alginate with a forefoot offloading boot. The wound  does not look infected however his wife is quick to point out about swelling on the dorsal foot. 2/16; not much change. Comes in with a small wound with a nonviable necrotic surface. Again marked undermining that I had totally removed last week. He had a arterial Doppler last week in Dr. Andree Elk office at [facility]. They have not heard these results. They are somewhat frustrated. 2/23; if anything this is worse. Undermining increased depth. Nonviable surface that looks pale. According his wife is TBI on the right was 0.22 although I have not verified this. He is going for an angiogram with Dr. Andree Elk next Monday. We are using polymen and offloading in a forefoot offloading boot. 3/2; the patient was  revascularized yesterday by [facility]. He had successful PTA of the left peroneal 60% to less than 20% and successful PTA of the left ATA 100% to less than 20%. He now has a pulse in the left foot Now that he has some additional blood flow there is not a lot of options here. On one hand we want activity to help keep blood flow augmented on the other hand pressure relief is going to be paramount if we have any chance to get this to close. After some discussion with his wife and patient I went ahead and put him in a total contact cast Objective Constitutional Patient is hypertensive.. Pulse regular and within target range for patient.Marland Kitchen Respirations regular, non-labored and within target range.. Temperature is normal and within the target range for the patient.Marland Kitchen Appears in no distress. Vitals Time Taken: 3:11 PM, Height: 69 in, Weight: 210 lbs, BMI: 31, Temperature: 99.1 F, Pulse: 84 bpm, Respiratory Rate: 18 breaths/min, Blood Pressure: 160/70 mmHg, Capillary Blood Glucose: 163 mg/dl. Cardiovascular Palpable dorsalis pedis pulse also an improvement. General Notes: Wound exam; the patient had a nonviable surface on this. I removed it with a #3 curette. There is some bleeding which is an improvement versus the last time we saw him on 2/23. Although there is no exposed bone there is not a lot of tissue over this. Integumentary (Hair, Skin) Wound #5 status is Open. Original cause of wound was Gradually Appeared. The wound is located on the Left Metatarsal head fifth. The wound measures 1.7cm length x 1.4cm width x 0.4cm depth; 1.869cm^2 area and 0.748cm^3 volume. There is Fat Layer (Subcutaneous Tissue) Exposed exposed. There is no tunneling or undermining noted. There is a small amount of serosanguineous drainage noted. The wound margin is distinct with the outline attached to the wound base. There is small (1-33%) red granulation within the wound bed. There is a large (67-100%)  amount of necrotic tissue within the wound bed including Adherent Slough. Assessment Active Problems ICD-10 Type 2 diabetes mellitus with foot ulcer Type 2 diabetes mellitus with diabetic peripheral angiopathy without gangrene Non-pressure chronic ulcer of other part of left foot limited to breakdown of skin Cellulitis of left lower limb Procedures Wound #5 Pre-procedure diagnosis of Wound #5 is a Diabetic Wound/Ulcer of the Lower Extremity located on the Left Metatarsal head fifth .Severity of Tissue Pre Debridement is: Fat layer exposed. There was a Excisional Skin/Subcutaneous Tissue Debridement with a total area of 2.38 sq cm performed by Ricard Dillon., MD. With the following instrument(s): Curette to remove Viable and Non-Viable tissue/material. Material removed includes Subcutaneous Tissue, Slough, Skin: Dermis, and Skin: Epidermis after achieving pain control using Other (benzociane 20%). No specimens were taken. A time out was conducted at 15:42, prior to  the start of the procedure. A Moderate amount of bleeding was controlled with Pressure. The procedure was tolerated well with a pain level of 0 throughout and a pain level of 0 following the procedure. Post Debridement Measurements: 1.7cm length x 1.4cm width x 0.4cm depth; 0.748cm^3 volume. Character of Wound/Ulcer Post Debridement is improved. Severity of Tissue Post Debridement is: Fat layer exposed. Post procedure Diagnosis Wound #5: Same as Pre-Procedure Pre-procedure diagnosis of Wound #5 is a Diabetic Wound/Ulcer of the Lower Extremity located on the Left Metatarsal head fifth . There was a Total Contact Cast Procedure by Ricard Dillon., MD. Post procedure Diagnosis Wound #5: Same as Pre-Procedure Plan Follow-up Appointments: Return Appointment in 1 week. Dressing Change Frequency: Wound #5 Left Metatarsal head fifth: Do not change entire dressing for one week. Wound Cleansing: Wound #5 Left Metatarsal head  fifth: May shower with protection. Primary Wound Dressing: Wound #5 Left Metatarsal head fifth: Silver Collagen - moisten with hydrogel Other: - pad right great toe for protection Secondary Dressing: Dry Gauze Off-Loading: Total Contact Cast to Left Lower Extremity 1. Left fifth metatarsal head we applied silver collagen with hydrogel 2. After some thought I elected to put him in a total contact cast. If we do not offload this this is going to open the bone 3. Hopefully he will be able to tolerate this with some augmentation in his blood flow 4. Dermagraft through his Musician) Signed: 12/30/2019 5:14:23 PM By: [name] MD Entered By: [name] on 12/30/2019 17:13:12 -------------------------------------------------------------------------------- Total Contact Cast Details Patient Name: Date of Service: [name], [name] 12/30/2019 3:00 PM Medical Record [id number] Patient Account Number: [id number] Date of Birth/Sex: Treating RN: 1946/10/23 (73 y.o. [name]) [name] Primary Care Provider: [name] Other Clinician: Referring Provider: Treating Provider/Extender:[name], [name], [name] in Treatment: 22 Total Contact Cast Applied for Wound Assessment: Wound #5 Left Metatarsal head fifth Performed By: Physician Ricard Dillon., MD Post Procedure Diagnosis Same as Pre-procedure Electronic Signature(s) Signed: 12/30/2019 5:14:23 PM By: [name] MD Entered By: [name] on 12/30/2019 16:21:58 -------------------------------------------------------------------------------- SuperBill Details Patient Name: Date of Service: [name], [name] 12/30/2019 Medical Record [id number] Patient Account Number: [id number] Date of Birth/Sex: Treating RN: Aug 01, 1946 (73 y.o. [name]) [name] Primary Care Provider: [name] Other Clinician: Referring Provider: Treating Provider/Extender:[name], [name], [name]  in Treatment: 22 Diagnosis Coding ICD-10 Codes Code Description E11.621 Type 2 diabetes mellitus with foot ulcer E11.51 Type 2 diabetes mellitus with diabetic peripheral angiopathy without gangrene L97.521 Non-pressure chronic ulcer of other part of left foot limited to breakdown of skin L03.116 Cellulitis of left lower limb Facility Procedures CPT4 Code Description: 48270786 11042 - DEB SUBQ TISSUE 20 SQ CM/< ICD-10 Diagnosis Description L97.521 Non-pressure chronic ulcer of other part of left foot limite Modifier: d to breakdo Quantity: 1 wn of skin Physician Procedures CPT4 Code Description: [id number] 10071 - WC PHYS SUBQ TISS 20 SQ CM ICD-10 Diagnosis Description L97.521 Non-pressure chronic ulcer of other part of left foot limite Modifier: d to breakdo Quantity: 1 wn of skin Electronic Signature(s) Signed: 12/30/2019 5:14:23 PM By: [name] MD Entered By: [name] on 12/30/2019 16:26:12

## 2020-01-06 ENCOUNTER — Encounter (HOSPITAL_BASED_OUTPATIENT_CLINIC_OR_DEPARTMENT_OTHER): Payer: Medicare Other | Attending: Internal Medicine | Admitting: Internal Medicine

## 2020-01-06 ENCOUNTER — Other Ambulatory Visit: Payer: Self-pay

## 2020-01-06 DIAGNOSIS — Z7982 Long term (current) use of aspirin: Secondary | ICD-10-CM | POA: Diagnosis not present

## 2020-01-06 DIAGNOSIS — L03116 Cellulitis of left lower limb: Secondary | ICD-10-CM | POA: Diagnosis not present

## 2020-01-06 DIAGNOSIS — L97521 Non-pressure chronic ulcer of other part of left foot limited to breakdown of skin: Secondary | ICD-10-CM | POA: Diagnosis not present

## 2020-01-06 DIAGNOSIS — Z9889 Other specified postprocedural states: Secondary | ICD-10-CM | POA: Insufficient documentation

## 2020-01-06 DIAGNOSIS — Z8614 Personal history of Methicillin resistant Staphylococcus aureus infection: Secondary | ICD-10-CM | POA: Insufficient documentation

## 2020-01-06 DIAGNOSIS — M1990 Unspecified osteoarthritis, unspecified site: Secondary | ICD-10-CM | POA: Insufficient documentation

## 2020-01-06 DIAGNOSIS — E11621 Type 2 diabetes mellitus with foot ulcer: Secondary | ICD-10-CM | POA: Diagnosis present

## 2020-01-06 DIAGNOSIS — I2510 Atherosclerotic heart disease of native coronary artery without angina pectoris: Secondary | ICD-10-CM | POA: Insufficient documentation

## 2020-01-06 DIAGNOSIS — I10 Essential (primary) hypertension: Secondary | ICD-10-CM | POA: Diagnosis not present

## 2020-01-06 DIAGNOSIS — E1140 Type 2 diabetes mellitus with diabetic neuropathy, unspecified: Secondary | ICD-10-CM | POA: Insufficient documentation

## 2020-01-06 DIAGNOSIS — Z7902 Long term (current) use of antithrombotics/antiplatelets: Secondary | ICD-10-CM | POA: Diagnosis not present

## 2020-01-06 DIAGNOSIS — Z794 Long term (current) use of insulin: Secondary | ICD-10-CM | POA: Diagnosis not present

## 2020-01-06 DIAGNOSIS — E1151 Type 2 diabetes mellitus with diabetic peripheral angiopathy without gangrene: Secondary | ICD-10-CM | POA: Insufficient documentation

## 2020-01-07 NOTE — Progress Notes (Signed)
[name], [name] ([id number]) Visit Report for 01/06/2020 HPI Details Patient Name: Date of Service: [name], [name] 01/06/2020 12:30 PM Medical Record [id number] Patient Account Number: [id number] Date of Birth/Sex: Treating RN: July 03, 1946 (74 y.o. [name]) [name] Primary Care Provider: [name] Other Clinician: Referring Provider: Treating Provider/Extender:Trasean Delima, [name], [name] in Treatment: 23 History of Present Illness HPI Description: 05/18/16; this is a 74year-old diabetic who is a type II diabetic on insulin. The history is that he traumatized his right foot developed a sore sometime in late March. Shortly thereafter he went on a cruise but he had to get off the cruise ship in [place] and fly urgently back to [place] where he was admitted to [facility] and ultimately underwent a transmetatarsal amputation by Dr. 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 According to the patient and his wife this wound never really healed. He was seen on 2 occasions in the wound care center in [place] and had vascular studies and then was referred urgently to Dr. Bridgett Larsson of vascular surgery. He underwent an angiogram on 05/10/16. Unfortunately nothing really could be done to improve his vascular status. He had a 75-90% stenosis in the midsegment of 1 segment of the posterior femoral artery. He had a patent popliteal, his anterior tibial occluded shortly after takeoff. Perineal had a greater than 90% stenosis posterior tibial is occluded feet had no distal collaterals feed distal aspect of the transmetatarsal amputation site. The patient tells me that he had a prolonged period of Santyl by Dr. Doran Durand was some initial improvement but then this was stopped. I think they're only applying daily dressings/dry dressings. He has not had a recent x-ray of the right foot he did have one before his surgery in April. His wife by the dimensions of the wound/surgical site being followed at  home since 4/20. At that point the dimensions were 0.5 x 12 x 0.2 on 7/19 this was 1.8 x 6 x 0.4. He is not currently on any antibiotics. His hemoglobin A1c in early April was 12.9 at that point he was started on insulin. Apparently his blood sugars are much lower he has an appointment with Dr. Legrand Como Alteimer of endocrine next week. 05/29/16 x-ray of the area did not show osteomyelitis. I think he probably needs an MRI at this point. His wife is asking about something called"Yireh" cream which is not FDA approved. I have not heard of this. 06/22/16; MRI did not really suggest osteomyelitis. There was minimal marrow edema and enhancement in the stump of the second metatarsal felt to be secondary likely to postoperative change rather than osteomyelitis. The patient has arranged his own consultation with Dr. Andree Elk at [facility], apparently their daughter lives in [place] and has some connection here. Any improvement in vascular supply by Dr. Andree Elk would of course be helpful. Dr. Bridgett Larsson did not feel that anything further could be done other than amputation if wound care did not result in healing or if the area deteriorates. 06/26/16; the patient has been to see Dr. Andree Elk at [facility] and had an angiogram. He is going for a procedure on Thursday which will involve catheterization. I'm not sure if this is an anterograde or retrograde approach. He has been using Santyl to the wound 07/10/16; the patient had a repeat angiogram and angioplasty at [facility] by Dr. Brunetta Jeans. His angiogram showed right CFA and profundal widely patent. The right as of a.m. popliteal artery were widely patent the right anterior tibial was occluded proximally and reconstitutes at  the ankle. Peroneal artery was patent to the foot. Posterior tibial artery was occluded. The patient had angioplasty of the anterior tibial artery.. This was quite successful. He was recommended for Plavix as well as aspirin. 07/17/16; the patient was close to be a nurse  visit today however the outer dressing of the Apligraf fell off. Noted drainage. I was asked to see the wound. The patient is noted an odor however his wife had noted that. Drainage with Apligraf not necessarily a bad thing. He has not been systemically unwell 07/24/16; we are still have an issue with drainage of this wound. In spite of this I applied his second Apligraf. Medially the area still is probing to bone. 08/07/16; Apligraf reapplied in general wound looks improved. 08/21/16 Apligraf #4. Wound looks much better 09/04/16 patientt's wound again today continues to appear to improve with the application of the Apligraf's. He notes no increased discomfort or concerns at this point in time. 09/18/16; the patient returns today 2 weeks after his fifth application of Apligraf. Predictably three quarters of the width of this wound has healed. The deep area that probe to bone medially is still open. The patient asked how much out-of-pocket dollars would be for additional Apligraf's. 09/25/16; now using Hydrofera Blue. He has completed 5 Apligraf applications with considerable improvement in this deep open transmetatarsal amputation site. His wound is now a triangular-shaped wound on the medial aspect. At roughly 12 to 2:00 this probes another centimeter but as opposed to in the past this does not probe to bone. The patient has been seen at [place] by Dr. Zenia Resides. He is not planning to do any more revascularization unless the wound stalls or worsens per the patient 10/02/16; 0.7 x 0.8 x 0.8. Unfortunately although the wound looks stable to improved. There is now easily probable bone. This hasn't been present for several weeks. Patient is not otherwise symptomatic he is not experiencing any pain. I did a culture of the wound bed 10/09/16. Deterioration last week. Culture grew MRSA and although there is improvement here with doxycycline prescribed over the phone I'm going to try to get him linezolid 600 twice  a day for 10 days today. 10/16/16; he is completing a weeks worth of linezolid and still has 3 more days to go. Small triangular-shaped open area with some degree of undermining. There is still palpable bone with a curet. Overall the area appears better than last week 10/20/16 he has completed the linezolid still having some nausea and vomiting but no diarrhea. He has exposed bone this week which is a deterioration. 10/27/16 patient now has a small but probing wound down to bone. Culture of this bone that I did last week showed a few methicillin-resistant staph aureus. I have little doubt that this represents acute/subacute osteomyelitis. The patient is currently on Doxy which I will continue he also completed 10 days of linezolid. We are now in a difficult situation with this patient's foot after considerable discussion we will send him back to see Dr. Doran Durand for a surgical opinion of this I'm also going to try to arrange a infectious disease consult at [facility] hopefully week and get this prior to her usual 4-6 weeks we having [place]. The patient clearly is going to need 6 weeks of IV vancomycin. If we cannot arrange this expediently I'll have to consider ordering this myself through a home infusion company 11/03/16; the patient now has a small in terms of circumference but probing wound. No bone palpable today. The  patient remains on doxycycline 100 twice a day which should support him until he sees infectious disease at [facility] next week the following week on Wednesday I believe he has an appointment with Dr. Andree Elk at [facility] who is his vascular cardiologist. Finally he has an appointment with Dr. Doran Durand on 11/22/16 we have been using silver alginate. The patient's wife states they are having trouble getting this through [facility] 11/13/16; the patient was seen by infectious disease at [facility] in the 11th PICC line placed in preparation for IV antibiotics. A tummy he has not going to get IV  vancomycin o Ceftaroline. They've also ordered an MRI. Patient has a follow-up with Dr. Doran Durand on 11/22/16 and Dr. Andree Elk at [facility] tomorrow 11/23/16 the patient is on daptomycin as directed by infectious disease at [facility]. He is also been back to see Dr. Andree Elk at [facility]. He underwent a repeat arteriogram. He had a successful PTA of the right anterior tibial artery. He is on dual antiplatelete treatment with Plavix and aspirin. Finally he had the MRI of his foot in [place]. This showed cellulitis about the foot worse distally edema and enhancement in the reminiscent of the second metatarsal was consistent with osteomyelitis therefore what I was assuming to be the first metatarsal may be actually the second. He also has a fluid collection deep to the calcaneus at the level of the calcaneal spur which could be an abscess or due to adventitial bursitis. He had a small tear in his Achilles 11/30/16; the patient continues on daptomycin as directed by infectious disease at [facility]. He is been revascularized by Dr. Andree Elk at [facility] in [place]. He has been to see Gretta Arab who was the orthopedic surgeon who did his original amputation. I have not seen his not however per the patient's wife he did not offer another surgical local surgical prodecure to remove involved bone. He verbalized his usual disbelief in not just hyperbarics but any medical therapy for this condition(osteomyelitis). I discussed this in detail with the patient today including answering the question about a BKA definitively "curing" the current condition. 12/07/16; the patient continues on daptomycin as directed by infectious disease at [facility]. This is directed at the MRSA that we cultured from his bone debridement from 12/22. Lab work today shows a white count of 8.7 hemoglobin of 10.5 which is microcytic and hypochromic differential count shows a slightly elevated monocyte count at 1.2 eosinophilic count of 0.6. His  creatinine is 1.11 sedimentation rate apparently is gone from 35-34 now 40. I explained was wife I don't think this represents a trend. His total CK is 48 12/14/16- patient is here for follow-up evaluation of his right TMA site. He continues to receive IV daptomycin per infectious disease. His serum inflammatory markers remain elevated. He complains of intermittent pain to the medial aspect of the TMA site with intermittent erythema. He voices no complaints or concerns regarding hyperbaric therapy. Overall he and his wife are expressing a frustration and discouragement regarrding the length of time of treatment. 12/21/16; small open wound at roughly the first or second metatarsal metatarsalphalyngeal joint reminiscence of his transmetatarsal amputation site he continues to receive IV daptomycin per infectious disease at [facility]. He will finish these a week tomorrow. He has lab work which I been copied on. His white count is 10.8 hemoglobin 8.9 MCV is low at 75., MCH low at 24.5 platelet count slightly elevated at 626. Differential count shows 70% neutrophils 10% monocytes and 10% eosinophils. His  comprehensive metabolic panel shows a slightly low sodium at 133 albumin low at 3.1 total CK is normal at 42 sedimentation rate is much higher at 82. He has had iron studies that show a serum iron of 15 and iron binding capacity of 238 and iron saturation of 6. This is suggestive of iron deficiency. B12 and folate were normal ferritin at 113 The patient tells me that he is not eating well and he has lost weight. He feels episodically nauseated. He is coughing and gagging on mucus which she thinks is sinusitis. He has an appointment with his primary doctor at 5:00 this afternoon in [facility] 01/02/17; the patient developed a subacute pneumonitis. He was admitted to [facility] after a CT scan showed an extensive interstitial pneumonitis [I have not yet seen this]. 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 He has since been discharged. He is not on oxygen. He feels fatigued and very short of breath with exertion. At [facility] the wound care nurse there felt that his wound was healed. He did complete his daptomycin and has follow-up with infectious disease on Friday. X-rays I did before he went to [facility] still suggested residual osteomyelitis in the anterior aspect of the second must metatarsal head. I'm not sure I would've expected any different. There was no other findings. I actually think I did this because of erythema over the first metatarsal head reminiscent 01/11/17; the patient has eosinophilic pneumonitis. He has been reviewed by pulmonology and given clearance for hyperbarics at least that's what his wife says. I'll need to see if there is note in care everywhere. Apparently the prognosis for improvement of daptomycin induced eosinophilic granulocyte is is 3 months without steroids. In the meantime infectious disease has placed him on doxycycline until the wound is closed. He is still is lost a lot of weight and his blood sugars are running in the mid 60s to low 80s fasting and at all times during the day 01/18/17; he has had adjustments in his insulin apparently his blood sugars in the morning or over 100. He wants to restart his hyperbaric treatment we'll do this at 1:00. 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 I think there is good reason to complete his treatments in order to give him the best chance of maintaining a healed status and these DFU 3 wounds with MRSA infection in the bone 02/15/17; the patient was seen today in conjunction with HBO. He completed hyperbaric oxygen today. The open area on his transmetatarsal site has remained closed. There was an area of erythema when I saw him  earlier in the week on the posterior heel although that is resolved as of today as well. He has been using a cam walker. This is a patient who came to [place] after a transmetatarsal amputation that was necrotic and dehisced. He required revascularization percutaneously on 2 different occasions by Dr. Andree Elk of invasive cardiology at [facility]. 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 He went to [facility] infectious disease and had IV daptomycin for 5 weeks before developing eosinophilic pneumonitis and requiring an admission to hospital/ICU. He made a good recovery and is continued on doxycycline since. As mentioned his foot is closed now. He has a follow-up with Dr. Andree Elk tomorrow. He is going  to [employer] on Monday for a custom-made shoe READMISSION Last visit Dr. Andree Elk [facility] 02/16/17 1. Critical limb ischemia of the RLE: s/p transmetatarsal amputation of the RLE, now completely healed. s/p right ATA percutaneous revascularization procedure x2, most recently 11/20/2016 s/p PTA of right AT 100% to less than 20% with a 2.5 x 200 balloon. He does have some residual osteomyelitis, but given the wound is closed, the orthopedist has recommended to follow. He has a fitting for a special shoe coming up next week. He has completed a course of daptomycin and doxycycline and he has been discharged by ID. He has completed a course of hyperbaric therapy. Continue Continue medical management with aspirin, Plavix and statin therapy. We will discuss ongoing Plavix therapy at follow-up in 6 months. 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 Given no symptoms, we will conservatively manage. He doesn't want to do any more invasive studies at this time, which is reasonable. The patient  arrives today out of 2 concerns both on the right transmetatarsal site. 1 at the level of the reminiscent fifth metatarsal head and the other at roughly the first or second. Both of these look like dark subcutaneous discoloration probably subdermal bleeding. He is recently obtained new adaptive footwear for the right foot. He also has a callus on the left fifth dorsal toe however he follows with podiatry for this and I don't think this is any issue. ABIs in this clinic today were 0.66 on the right and 1.06 on the left. On entrance into our clinic initially this was 0.95 and 0.92. He does not describe current claudication. They're going away on a cruise in 8 weeks and I think are trying to do the month is much as they can proactively. They follow with podiatry and have an appointment with Dr. Andree Elk in October READMISSION 06/25/18 This is a patient that we have not seen in almost a year. He is a type II diabetic with known PAD. He is followed by Dr. Andree Elk of interventional cardiology at [facility] in [place]. He is required revascularization for significant PAD. When we first saw him he required a transmetatarsal amputation. He had underlying osteomyelitis with a nonhealing surgical wound. This eventually closed with wound care, IV antibiotics and hyperbaric oxygen. They tell me that he has a modified shoe and he is very active walking up to 4 miles a day. He is followed by Dr. Geroge Baseman of podiatry. His wife states that he underwent a removal of callus over this site on July 9. She felt there may be drainage from this site after that although she could never really determined and there was callus buildup again. On 06/01/18 there was pressure bleeding through the overlying callus. he was given a prescription for 7 days of Bactrim. Fortuitously he has an appointment with Dr. Andree Elk on 06/04/18 and he immediately underwent revascularization of the right leg although I have not had a chance to review these  records in care everywhere. This was apparently done through anterior and retrograde access. On 06/06/18 he had another debridement by Dr. Bernette Mayers. Vitamin soaking with Epsom salts for 20 minutes and applying calcium alginate. The original trans-met was on April 2017 I believe by Dr. Doran Durand. His ABI in our clinic was noncompressible today. 07/02/18; x-ray I ordered last week was negative for osteomyelitis. Swab culture was also negative. He is going to  require an MRI which I have ordered today. 07/09/18; surprisingly the MRI of the foot that I ordered did not show osteomyelitis. He did suggest the possibility of cellulitis. For this reason I'll go ahead and give him a 10 day course of doxycycline. Although the previous culture of this area was negative 07/16/18; it arrives with the wound looking much the same. Roughly the same depth. He has thick subcutaneous tissue around the wound orifice but this still has roughly the same depth. Been using silver alginate. We applied Oasis #1 today 07/23/2018; still having to remove a lot of callus and thick subcutaneous tissue to actually define the wound here. Most of this seems to have closed down yet he has a comma shaped divot over the top of the area that still I think is open. We applied Oasis #2 His wife expressed concern about the tip of his left great toe. This almost looks like a small blister. She also showed it today to her podiatrist Dr. Geroge Baseman who did not think this was anything serious. I am not sure is anything serious either however I think it bears some watching. He is not in any pain however he is insensate 07/30/2018;; still on a lot of nonviable tissue over the surface of the wound however cleaning this up reveals a more substantial wound orifice but less of a probing wound depth. This is not probed to bone. There is no evidence of infection The wife is still concerned about a non-open area on the tip of his left great toe. Almost feels like a  bony outgrowth. She had previously showed this to podiatry. I do not think this is a blister. A friction area would be possible although he is really not walking according to his wife. He had a small skin tag on his right buttock but no open wound here either 08/06/2018; we applied a third Oasis last week. Unfortunately there is really no improvement. Still requiring extensive debridement to expose the wound bed from a horizontal slitlike depression. I still have not been able to get this to fill in properly. On the positive side there is now no probable bone from when he first came into the facility. I changed him to silver alginate today after a reasonably aggressive debridement 08/13/2018; once again the patient comes in with skin and subcutaneous tissue closing over the small probing area with the underlying cavity of the wound on the right TMA site. I applied silver alginate to this last week. Prior to that we used Oasis x3 still not able to get this area granulating. He does not have a probing area of the bone which is an improvement from when I spur started working on this and an MRI did not suggest osteomyelitis. I do not see evidence of infection here but I am increasingly concerned about why I cannot get this area to granulate. Each time I debrided this is looks like this is simply a matter of getting granulation to fill in the hole and then getting epithelialization. This does not seem to happen Also I sent him back to see podiatry Dr. Earleen Newport about the what felt to be bony outgrowth on the tip of his left great toe. Apparently after heel he left the clinic last week or the next day he developed a blood blister. He did see Dr. Earleen Newport. He went on to have a debridement of the medial nail cuticle he now has an open area here as well as some denuded skin. They  did an x-ray apparently does have a bony outgrowth or spur but I am not able to look at this. They are using topical antibiotics  apparently there was some suggested he use Santyl. Patient's wife was anxious for my opinion of this 08/20/2018; we are able to keep the wound open this time instead of the thick subcutaneous tissue closing over the top of it however unfortunately once again this probes to bone. I do not see any evidence of infection and previous MRI did not show osteomyelitis. I elected to go back to the Oasis to see if we can stimulate some granulation. Last saw his vascular interventional cardiologist Dr. Andree Elk at the beginning of August and he had a repeat procedure. Nevertheless I wonder how much blood flow he has down to this area With regards to the left first toe he is seeing Dr. Earleen Newport next week. They are applying Bactroban to this area. 08/27/2018 Once again he comes in with thick eschar and subcutaneous tissue over the top of the small probing hole. This does not appear to go down to bone but it still has roughly the same depth. I reapplied Oasis today Over the left great toe there appears to be more of the wound at the tip of his toe than there was last week. This has an eschar on the surface of it. Will change to Santyl. There is seeing podiatry this afternoon 09/03/2018 He comes in today with the area on the transmetatarsal's site with a fair amount of callus, nonviable tissue over the circumference but it was not closed. I removed all of this as well as some subcutaneous debris and reapplied Oasis. There is no exposed bone The area over the tip of the left great toe started off as a nodule of uncertain etiology. He has been followed with podiatry. They have been removing part of the medial nail bed. He has nonviable tissue over the wound he been using Santyl in this area 09/10/18 Unfortunately comes in with neither wound area looking improved. The transmetatarsal amputation site once Again has nonviable debris over the surface requiring debridement. Unfortunately underneath this there is  nothing that looks viable and this once again goes right down to bone. There is no purulent drainage and no erythema. Also the surgical wound from podiatry on the left first toe has an ischemic-looking eschar over the surface of the tip of the toe I have elected not to attempt candidly debride this His wife as arranged for him to have follow-up noninvasive studies in [place] under the care of Dr. Andree Elk clinic. The question is he has known severe PAD. They had recently seen him in August. He has had revascularizations in both legs within the last 4 or 5 months. He is not complaining of pain and I cannot really get a history of claudication. He has not systemically unwell 09/20/2018 the patient has been to [facility] and been revascularized by Dr. Andree Elk earlier this week. Apparently he was able to open up the anterior tibial artery although I have not actually seen his formal report. He is going for an attempt to revascularize on the left on Monday. He is apparently working with an investigational stent for lower extremity arteries below the knee and he has talked to the patient about placing that on Monday if possible. The patient has been using silver alginate on the transmetatarsal amputation site and Santyl on the left 09/30/2018; patient had his revascularization on the left this apparently included a standard approach  as well as a more distal arterial catheterization although I do not have any information on this from Dr. Andree Elk. In fact I do not even see the initial revascularization that he had on the right. I have included the arterial history from Dr. Andree Elk last note however below; ASSESSMENT/PLAN: 1. Hx of Critical limb ischemia bilateral lower extremities, PAD: -s/p transmetatarsal amputation of the RLE. -s/p right ATA percutaneous revascularization procedure x2, most recently 11/20/2016 s/p PTA of right AT 100% to less than 20% with a 2.5 x 200 balloon. -On original angiogram  05/15/2016, he had a significant 70-95% left popliteal artery stenosis with AT and PT artery occlusions and one vessel runoff via the peroneal artery. -03/21/2018 s/p PTA of 90% left popliteal artery to <10% with a 5x20 cutting balloon, PTA of 90% left peroneal to <20% with a 3x20 balloon, PTA of 100% left AT to <20% with a 2.5x220 balloon. 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 Will plan on the LLE soon after. Risks/benefits of procedure have been discussed and patient has elected to proceed. We have been using endoform to the right TMA amputation site wound and Santyl to the left great toe 10/07/2018; the area on the tip of his left great toe looked better we have been using Santyl here. We continue to have a very difficult probing hole on the right TMA amputation site we have been using endoform. I went on to use his fifth Oasis today 10/14/2018; the tip of the left great toe continues to look better. We have been using Santyl here the surface however is healthy and I think we can change to an alginate. The right TMA has not changed. Once again he has no superficial opening there is callus and thick subcutaneous tissue over the orifice once you remove this there is the probing area that we have been dealing with without too much change. There is no palpable bone I have been placing Oasis here and put Oasis #6 in this today after a more vigorous debridement 10/21/18; the left great toe still has necrotic surface requiring debridement. The right TMA site hasn't changed in view of the thick callus over the wound bed. With removal of this there is still the opening however this does not appear to have the same depth. Again there is no palpable bone. Oasis was replaced 10/28/2018; patient comes in with both wounds looking worse. The area over the  first toe tip is now down to bone. The area over the TMA site is deeper and down to bone clearly with a increase in overall wound area. Equally concerning on the right TMA is the complete absence of a pulse this week which is a change. We are not even able to Doppler this. On the right he has a noncompressible ABI greater than 1.4 11/04/2018. Both wounds look somewhat worse. X-rays showed no osteomyelitis of the right foot but on the left there was underlying osteomyelitis in the left great toe distal phalanx. I been on the phone to Dr. Andree Elk surface at [facility] in [place] and they are arranging for another angiogram on the right on Monday. I have him on doxycycline for the osteomyelitis in the left great toe for now. Infectious disease may be necessary 1/17; 2-week hiatus. Patient was admitted to hospital at [place]. My understanding is he underwent an angioplasty of  the right anterior tibial artery and had stents placed in the left anterior artery and the left tibial peroneal trunk. This was done by Dr. Andree Elk of interventional radiology. There is no major change in either 1 of the wounds. They have been using Aquacel Ag. As far as they are aware no imaging studies were done of the foot which is indeed unfortunate. I had him on doxycycline for 2 weeks since we identified the osteomyelitis in the left great toe by plain x-ray. I have renewed that again today. I am still suspicious about osteomyelitis in the amputation site and would consider doing another MRI to compare with the one done in September. The idea of hyperbaric oxygen certainly comes up for discussion 1/24; no major change in either wound area. I have him on doxycycline for osteomyelitis at the tip of the left great toe. As noted he has been previously and recently revascularized by Dr. Andree Elk at [place]. He is tolerating the doxycycline well. For some reason we do not have an infectious disease consult yet. Culture of drainage from  the right foot site last week was negative 1/31; MRI of the right foot did not show osteomyelitis of the right ankle and foot. Notable for a skin ulceration overlying the second metatarsal stump with generalizing soft tissue edema of the ankle and foot consistent with cellulitis. Noted to have a partial-thickness tear of the Achilles tendon 7.5 cm proximal to the insertion Nothing really new in terms of symptoms. Patient's area on the tip of the left great toe is just about closed although he still has the probing area on the metatarsal amputation site. Appointment with Dr. Linus Salmons of infectious disease next week 2/7; Dr. Novella Olive did not feel that any further antibiotics were necessary he would follow-up in 2 months. The left great toe appears to be closed still some surface callus that I gently looked under high did not see anything open or anything that was threatening to be open. He still has the open area on the mid part of his TMA site using endoform 2/14; left great toe is closed and he is completing his doxycycline as of last Sunday. He is not on any antibiotics. Unfortunately out of the right foot his wife noticed some subdermal hemorrhage this week. He had been walking 2 miles I had given him permission to do so this is not really a plantar wound. Using endoform to this wound but I changed to silver alginate this week 2/21; left great toe remains closed. Culture last week grew Streptococcus angiosis which I am not really familiar with however it is penicillin sensitive and I am going to put him on Augmentin. Not much change in the wound on the right foot the deep area is still probing precariously close to bone and the wound on the margin of the TMA is larger. 2/28; left great toe remains closed. He is completing the Augmentin I gave him last week. 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 This is being shown to Dr. Andree Elk at [place] to see if there  is anything else that can be done here. He has been using silver alginate strips on the right 3/6; left great toe remains closed. He sees Dr. Andree Elk on Monday. The area on the plantar aspect of the right foot has the same small orifice with thick callused tissue around this. However this time with removal of the callus tissue the wound is open all the way along the  incision line to the end medially. We have been using silver alginate. 3/13; left toe remains closed although the area is callused. He sees Dr. Jacqualyn Posey of podiatry next week. The area on the plantar right foot looked a lot better this week. Culture I did of this was negative we use silver alginate. He is going next week for an attempt at revascularization by Dr. Andree Elk 3/23; left toe remains closed although the area is callused. He will see Dr. Jacqualyn Posey in follow-up. The area on the right plantar foot continues to look surprisingly better over the last 3 visits. We have been using silver alginate. The revascularization he was supposed to have by Dr. Andree Elk at [facility] in [place] has been canceled [facility] procedure] 4/6; the right foot remains closed albeit callused. Podiatry canceled the appointment with regards to the left great toe. He has not seen Dr. Andree Elk at [facility] but thinks that Dr. Andree Elk has "done all he can do". He would be a candidate for the hemostaemix trial Readmission 07/29/2019 Mr. [name] is a man we know well from at least 3 previous stays in this clinic. He is a type II diabetic with severe PAD followed by Dr. Andree Elk at [facility] in [place]. 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 So far everything in both of these areas has remained closed. About 2 weeks ago he went to see his podiatrist [facility] Dr. Babs Bertin. He had a thick callus on the left fifth metatarsal head that was  shaved. He has developed an open wound in this area. They have been offloading this in his diabetic shoes. The patient has not had any more revascularizations by Dr. Andree Elk since the last time he was here. ABI in our clinic at the posterior tibial on the left was 1.06 10/6; no real change in the area on the plantar met head. Small wound with 2 mm of depth. He has thick skin probably from pressure around the wound. We have been using silver alginate 10/13; small wound in the left fifth plantar met head. Arrives today with undermining laterally and purulent drainage. Our intake nurse cultured this. His wife stated they noticed a change in color over the last day or 2. He is not systemically unwell 10/19; small wound on the fifth plantar metatarsal head. Culture I did last week showed Staphylococcus lugdunensis. Although this could be a skin contaminant the possibility of a skin and soft tissue infection was there. I did give him empiric doxycycline which should have covered this. We are using silver alginate to the wound. We put him in a total contact cast today. 10/22; small wound on the fifth plantar metatarsal head. He has completed antibiotics. He also has severe PAD which worries me about just about any wound on this man's foot 10/29; small superficial area on the fifth plantar metatarsal head. Measuring slightly smaller. He is not currently on any antibiotics. I been using a total contact cast in this man with very severe PAD but he seems to be tolerating this well 11/5; left plantar fifth metatarsal head. Silver alginate being used under a total contact cast 11/12; wound not much different than last week. Using a #15 scalpel debridement around the wound. Still silver collagen under a total contact cast. He has severe PAD and that may be playing a role in  this 11/19; disappointing that the wound is not really changed that much. I think it is come down in overall surface area because  originally this was on the lateral part of the fifth metatarsal head however recently it is not really changed. Most of this is filled in but it will not epithelialized there is surface debris on this which may be mostly related to ischemia. They have an appointment with Dr. Andree Elk on 12/9 09/24/2019 on evaluation today patient appears to be doing somewhat better with regard to the wound on the left fifth metatarsal head. Fortunately there does not appear to be any signs of active infection at this time. No fevers, chills, nausea, vomiting, or diarrhea. The wound does not appear to be completely closed and I do feel like the Hydrofera Blue is helping to some degree although I feel like the cast as well as keeping things from breaking down or getting any larger at least. As far as his wound overview it shows that the overall size of the wound is slightly smaller but really maintaining within the realm of about the same. He had no troubles with the cast which is good news. 12/1; left fifth metatarsal head. Perhaps somewhat more vibrant. We have been using Hydrofera Blue. He sees Dr. Andree Elk at [place] 1 week tomorrow. He be back next week and I hope to have a better idea which way the wound is going however I will not cast him next week in case Dr. Andree Elk wants to reevaluate things in his foot. The left leg is the leg with the stent in place and I wonder whether these are going to have to be reevaluated. 12/8; left fifth metatarsal head. Quite a bit better this week. Only a small open area remains. He sees Dr. Andree Elk at [place] tomorrow. Dr. Andree Elk is previously done his vascular interventions. He has 2 stents in the left leg as I remember things. I therefore will not put him in a total contact cast today. He will be using a forefoot off loader. We will use silver collagen on the wound. We will see him again next week 12/15; left fifth metatarsal head. He saw Dr. Andree Elk and is scheduled for an angiogram on  Thursday. Unfortunately although his wound was a lot better last week it is really deteriorated this week. Small punched-out hole with depth and overhanging tissue. We have been using Hydrofera Blue 12/22; patient had an 80% stenosis proximal to the stent I believe in the below-knee popliteal artery. This was opened by Dr. Andree Elk. According the patient the stent itself was satisfactory. I have not been able to review this note. 12/29; patient arrives today with the wound about the same size some undermining medially. We have been using Hydrofera Blue under a total contact cast and that is what we will do again today 11/04/2019. Slightly smaller in orifice but about 4 mm undermining almost circumferentially. We have been using Hydrofera Blue. Apparently the Hydrofera Blue did not stay on the wound after we removed the cast. Some minor looking cast irritation on the right lateral 1/12; unfortunately things did not go well this week. Arrives in clinic today with a deeper wound with undermining more concerning a area of pus. Specimen was obtained for culture. We are not going to be able to put him in a cast today. 1/19; purulent material last week which I cultured showed Enterobacter cloacae. He was on ampicillin previously prescribed by his primary doctor which should not have covered this  however mysteriously the wound looks somewhat better. There is less depth less erythema certainly no drainage. He will need a course of ciprofloxacin which I will provide today. 1/26; he has completed the ciprofloxacin. I don't think he needs any additional antibiotics. The areas on the left fifth plantar met head. We've been using silver alginate 2/2; comes in today with 0.4 cm of circumferential undermining around a small wound in this area. We have been using silver alginate with a forefoot offloading boot 2/9; again the wound appears larger nonviable surface and with 0.4 cm roughly of circumferential  undermining. We have been using silver alginate with a forefoot offloading boot. The wound does not look infected however his wife is quick to point out about swelling on the dorsal foot. 2/16; not much change. Comes in with a small wound with a nonviable necrotic surface. Again marked undermining that I had totally removed last week. He had a arterial Doppler last week in Dr. Andree Elk office at [facility]. They have not heard these results. They are somewhat frustrated. 2/23; if anything this is worse. Undermining increased depth. Nonviable surface that looks pale. According his wife is TBI on the right was 0.22 although I have not verified this. He is going for an angiogram with Dr. Andree Elk next Monday. We are using polymen and offloading in a forefoot offloading boot. 3/2; the patient was revascularized yesterday by [facility]. He had successful PTA of the left peroneal 60% to less than 20% and successful PTA of the left ATA 100% to less than 20%. He now has a pulse in the left foot Now that he has some additional blood flow there is not a lot of options here. On one hand we want activity to help keep blood flow augmented on the other hand pressure relief is going to be paramount if we have any chance to get this to close. After some discussion with his wife and patient I went ahead and put him in a total contact cast. 3/9; he arrives in clinic today with some debris over the wound surface. We use silver collagen under a total contact cast after his revascularization by Dr. Andree Elk Electronic Signature(s) Signed: 01/06/2020 5:48:31 PM By: [name] MD Entered By: [name] on 01/06/2020 13:19:51 -------------------------------------------------------------------------------- Physical Exam Details Patient Name: Date of Service: [name], [name] 01/06/2020 12:30 PM Medical Record [id number] Patient Account Number: [id number] Date of Birth/Sex: Treating RN: [birth year]  (73 y.o. [name] Primary Care Provider: [name] Other Clinician: Referring Provider: Treating Provider/Extender:Tigran Haynie, [name], [name] in Treatment: 23 Cardiovascular Recklessly is dorsalis pedis pulses palpable on the left foot. Notes Wound exam; the patient has a slough surface on this. I gently remove this removing slough with Anasept and gauze. It seems to come off quite easily.Marland Kitchen He does not have any exposed bone there is some bleeding. Electronic Signature(s) Signed: 01/06/2020 5:48:31 PM By: [name] MD Entered By: [name] on 01/06/2020 13:22:28 -------------------------------------------------------------------------------- Physician Orders Details Patient Name: Date of Service: [name], [name] 01/06/2020 12:30 PM Medical Record [id number] Patient Account Number: [id number] Date of Birth/Sex: Treating RN: July 03, 1946 (74 y.o. [name] Primary Care Provider: [name] Other Clinician: Referring Provider: Treating Provider/Extender:Kathalene Sporer, [name], [name] in Treatment: 23 Verbal / Phone Orders: No Diagnosis Coding ICD-10 Coding Code Description E11.621 Type 2 diabetes mellitus with foot ulcer E11.51 Type 2 diabetes mellitus with diabetic peripheral angiopathy without gangrene L97.521 Non-pressure chronic ulcer of other part of left foot limited  to breakdown of skin L03.116 Cellulitis of left lower limb Follow-up Appointments Return Appointment in 1 week. Dressing Change Frequency Wound #5 Left Metatarsal head fifth Do not change entire dressing for one week. Wound Cleansing Wound #5 Left Metatarsal head fifth May shower with protection. Primary Wound Dressing Wound #5 Left Metatarsal head fifth Silver Collagen - moisten with hydrogel Other: - pad right great toe for protection Secondary Dressing Dry Gauze Off-Loading Total Contact Cast to Left Lower Extremity Electronic Signature(s) Signed:  01/06/2020 5:48:31 PM By: [name] MD Signed: 01/07/2020 7:20:02 AM By: [name] RN Entered By: [name] on 01/06/2020 13:12:58 -------------------------------------------------------------------------------- Problem List Details Patient Name: Date of Service: [name], [name] 01/06/2020 12:30 PM Medical Record [id number] Patient Account Number: [id number] Date of Birth/Sex: Treating RN: 12/15/1945 (73 y.o. Staci Acosta, [place] Primary Care Provider: [name] Other Clinician: Referring Provider: Treating Provider/Extender:Quanesha Klimaszewski, [name], [name] in Treatment: 23 Active Problems ICD-10 Evaluated Encounter Code Description Active Date Today Diagnosis E11.621 Type 2 diabetes mellitus with foot ulcer 07/29/2019 No Yes E11.51 Type 2 diabetes mellitus with diabetic peripheral 07/29/2019 No Yes angiopathy without gangrene L97.521 Non-pressure chronic ulcer of other part of left foot 07/29/2019 No Yes limited to breakdown of skin L03.116 Cellulitis of left lower limb 08/12/2019 No Yes Inactive Problems Resolved Problems Electronic Signature(s) Signed: 01/06/2020 5:48:31 PM By: [name] MD Entered By: [name] on 01/06/2020 13:18:41 -------------------------------------------------------------------------------- Progress Note Details Patient Name: Date of Service: [name], [name] 01/06/2020 12:30 PM Medical Record [id number] Patient Account Number: [id number] Date of Birth/Sex: Treating RN: 08-14-1946 (73 y.o. [name] Primary Care Provider: [name] Other Clinician: Referring Provider: Treating Provider/Extender:Jameil Whitmoyer, [name], [name] in Treatment: 23 Subjective History of Present Illness (HPI) 05/18/16; this is a 73year-old diabetic who is a type II diabetic on insulin. The history is that he traumatized his right foot developed a sore sometime in late March. Shortly thereafter he went on a cruise but he had to get  off the cruise ship in [place] and fly urgently back to [place] where he was admitted to [facility] and ultimately underwent a transmetatarsal amputation by Dr. 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 According to the patient and his wife this wound never really healed. He was seen on 2 occasions in the wound care center in [place] and had vascular studies and then was referred urgently to Dr. Bridgett Larsson of vascular surgery. He underwent an angiogram on 05/10/16. Unfortunately nothing really could be done to improve his vascular status. He had a 75-90% stenosis in the midsegment of 1 segment of the posterior femoral artery. He had a patent popliteal, his anterior tibial occluded shortly after takeoff. Perineal had a greater than 90% stenosis posterior tibial is occluded feet had no distal collaterals feed distal aspect of the transmetatarsal amputation site. The patient tells me that he had a prolonged period of Santyl by Dr. Doran Durand was some initial improvement but then this was stopped. I think they're only applying daily dressings/dry dressings. He has not had a recent x-ray of the right foot he did have one before his surgery in April. His wife by the dimensions of the wound/surgical site being followed at home since 4/20. At that point the dimensions were 0.5 x 12 x 0.2 on 7/19 this was 1.8 x 6 x 0.4. He is not currently on any antibiotics. His hemoglobin A1c in early April was 12.9 at that point he was started on insulin. Apparently his blood sugars are much  lower he has an appointment with Dr. Legrand Como Alteimer of endocrine next week. 05/29/16 x-ray of the area did not show osteomyelitis. I think he probably needs an MRI at this point. His wife is asking about something called"Yireh" cream which is not FDA approved. I have not heard of this. 06/22/16; MRI did not really suggest osteomyelitis. There was minimal marrow edema and enhancement in the stump of the second metatarsal felt to be  secondary likely to postoperative change rather than osteomyelitis. The patient has arranged his own consultation with Dr. Andree Elk at [place], apparently their daughter lives in [place] and has some connection here. Any improvement in vascular supply by Dr. Andree Elk would of course be helpful. Dr. Bridgett Larsson did not feel that anything further could be done other than amputation if wound care did not result in healing or if the area deteriorates. 06/26/16; the patient has been to see Dr. Andree Elk at [place] and had an angiogram. He is going for a procedure on Thursday which will involve catheterization. I'm not sure if this is an anterograde or retrograde approach. He has been using Santyl to the wound 07/10/16; the patient had a repeat angiogram and angioplasty at [place] by Dr. Brunetta Jeans. His angiogram showed right CFA and profundal widely patent. The right as of a.m. popliteal artery were widely patent the right anterior tibial was occluded proximally and reconstitutes at the ankle. Peroneal artery was patent to the foot. Posterior tibial artery was occluded. The patient had angioplasty of the anterior tibial artery.. This was quite successful. He was recommended for Plavix as well as aspirin. 07/17/16; the patient was close to be a nurse visit today however the outer dressing of the Apligraf fell off. Noted drainage. I was asked to see the wound. The patient is noted an odor however his wife had noted that. Drainage with Apligraf not necessarily a bad thing. He has not been systemically unwell 07/24/16; we are still have an issue with drainage of this wound. In spite of this I applied his second Apligraf. Medially the area still is probing to bone. 08/07/16; Apligraf reapplied in general wound looks improved. 08/21/16 Apligraf #4. Wound looks much better 09/04/16 patientt's wound again today continues to appear to improve with the application of the Apligraf's. He notes no increased discomfort or concerns at this point  in time. 09/18/16; the patient returns today 2 weeks after his fifth application of Apligraf. Predictably three quarters of the width of this wound has healed. The deep area that probe to bone medially is still open. The patient asked how much out-of-pocket dollars would be for additional Apligraf's. 09/25/16; now using Hydrofera Blue. He has completed 5 Apligraf applications with considerable improvement in this deep open transmetatarsal amputation site. His wound is now a triangular-shaped wound on the medial aspect. At roughly 12 to 2:00 this probes another centimeter but as opposed to in the past this does not probe to bone. The patient has been seen at [place] by Dr. Zenia Resides. He is not planning to do any more revascularization unless the wound stalls or worsens per the patient 10/02/16; 0.7 x 0.8 x 0.8. Unfortunately although the wound looks stable to improved. There is now easily probable bone. This hasn't been present for several weeks. Patient is not otherwise symptomatic he is not experiencing any pain. I did a culture of the wound bed 10/09/16. Deterioration last week. Culture grew MRSA and although there is improvement here with doxycycline prescribed over the phone I'm going to try to  get him linezolid 600 twice a day for 10 days today. 10/16/16; he is completing a weeks worth of linezolid and still has 3 more days to go. Small triangular-shaped open area with some degree of undermining. There is still palpable bone with a curet. Overall the area appears better than last week 10/20/16 he has completed the linezolid still having some nausea and vomiting but no diarrhea. He has exposed bone this week which is a deterioration. 10/27/16 patient now has a small but probing wound down to bone. Culture of this bone that I did last week showed a few methicillin-resistant staph aureus. I have little doubt that this represents acute/subacute osteomyelitis. The patient is currently on Doxy which I  will continue he also completed 10 days of linezolid. We are now in a difficult situation with this patient's foot after considerable discussion we will send him back to see Dr. Doran Durand for a surgical opinion of this I'm also going to try to arrange a infectious disease consult at [facility] hopefully week and get this prior to her usual 4-6 weeks we having Davy. The patient clearly is going to need 6 weeks of IV vancomycin. If we cannot arrange this expediently I'll have to consider ordering this myself through a home infusion company 11/03/16; the patient now has a small in terms of circumference but probing wound. No bone palpable today. The patient remains on doxycycline 100 twice a day which should support him until he sees infectious disease at [facility] next week the following week on Wednesday I believe he has an appointment with Dr. Andree Elk at [facility] who is his vascular cardiologist. Finally he has an appointment with Dr. Doran Durand on 11/22/16 we have been using silver alginate. The patient's wife states they are having trouble getting this through [facility] 11/13/16; the patient was seen by infectious disease at [facility] in the 11th PICC line placed in preparation for IV antibiotics. A tummy he has not going to get IV vancomycin o Ceftaroline. They've also ordered an MRI. Patient has a follow-up with Dr. Doran Durand on 11/22/16 and Dr. Andree Elk at [facility] tomorrow 11/23/16 the patient is on daptomycin as directed by infectious disease at [facility]. He is also been back to see Dr. Andree Elk at [facility]. He underwent a repeat arteriogram. He had a successful PTA of the right anterior tibial artery. He is on dual antiplatelete treatment with Plavix and aspirin. Finally he had the MRI of his foot in [place]. This showed cellulitis about the foot worse distally edema and enhancement in the reminiscent of the second metatarsal was consistent with osteomyelitis therefore what I was assuming to be the first  metatarsal may be actually the second. He also has a fluid collection deep to the calcaneus at the level of the calcaneal spur which could be an abscess or due to adventitial bursitis. He had a small tear in his Achilles 11/30/16; the patient continues on daptomycin as directed by infectious disease at [facility]. He is been revascularized by Dr. Andree Elk at [facility] in [place]. He has been to see Gretta Arab who was the orthopedic surgeon who did his original amputation. I have not seen his not however per the patient's wife he did not offer another surgical local surgical prodecure to remove involved bone. He verbalized his usual disbelief in not just hyperbarics but any medical therapy for this condition(osteomyelitis). I discussed this in detail with the patient today including answering the question about a BKA definitively "curing" the current condition. 12/07/16;  the patient continues on daptomycin as directed by infectious disease at [facility]. This is directed at the MRSA that we cultured from his bone debridement from 12/22. Lab work today shows a white count of 8.7 hemoglobin of 10.5 which is microcytic and hypochromic differential count shows a slightly elevated monocyte count at 1.2 eosinophilic count of 0.6. His creatinine is 1.11 sedimentation rate apparently is gone from 35-34 now 40. I explained was wife I don't think this represents a trend. His total CK is 48 12/14/16- patient is here for follow-up evaluation of his right TMA site. He continues to receive IV daptomycin per infectious disease. His serum inflammatory markers remain elevated. He complains of intermittent pain to the medial aspect of the TMA site with intermittent erythema. He voices no complaints or concerns regarding hyperbaric therapy. Overall he and his wife are expressing a frustration and discouragement regarrding the length of time of treatment. 12/21/16; small open wound at roughly the first or second metatarsal  metatarsalphalyngeal joint reminiscence of his transmetatarsal amputation site he continues to receive IV daptomycin per infectious disease at [facility]. He will finish these a week tomorrow. He has lab work which I been copied on. His white count is 10.8 hemoglobin 8.9 MCV is low at 75., MCH low at 24.5 platelet count slightly elevated at 626. Differential count shows 70% neutrophils 10% monocytes and 10% eosinophils. His comprehensive metabolic panel shows a slightly low sodium at 133 albumin low at 3.1 total CK is normal at 42 sedimentation rate is much higher at 82. He has had iron studies that show a serum iron of 15 and iron binding capacity of 238 and iron saturation of 6. This is suggestive of iron deficiency. B12 and folate were normal ferritin at 113 The patient tells me that he is not eating well and he has lost weight. He feels episodically nauseated. He is coughing and gagging on mucus which she thinks is sinusitis. He has an appointment with his primary doctor at 5:00 this afternoon in [place] 01/02/17; the patient developed a subacute pneumonitis. He was admitted to [facility] after a CT scan showed an extensive interstitial pneumonitis [I have not yet seen this]. 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 He has since been discharged. He is not on oxygen. He feels fatigued and very short of breath with exertion. At [facility] the wound care nurse there felt that his wound was healed. He did complete his daptomycin and has follow-up with infectious disease on Friday. X-rays I did before he went to [facility] still suggested residual osteomyelitis in the anterior aspect of the second must metatarsal head. I'm not sure I would've expected any different. There was no other findings. I actually think I did this because of erythema over the first metatarsal head reminiscent 01/11/17; the  patient has eosinophilic pneumonitis. He has been reviewed by pulmonology and given clearance for hyperbarics at least that's what his wife says. I'll need to see if there is note in care everywhere. Apparently the prognosis for improvement of daptomycin induced eosinophilic granulocyte is is 3 months without steroids. In the meantime infectious disease has placed him on doxycycline until the wound is closed. He is still is lost a lot of weight and his blood sugars are running in the mid 60s to low 80s fasting and at all times during the day 01/18/17; he has had adjustments in his insulin apparently his blood  sugars in the morning or over 100. He wants to restart his hyperbaric treatment we'll do this at 1:00. 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 I think there is good reason to complete his treatments in order to give him the best chance of maintaining a healed status and these DFU 3 wounds with MRSA infection in the bone 02/15/17; the patient was seen today in conjunction with HBO. He completed hyperbaric oxygen today. The open area on his transmetatarsal site has remained closed. There was an area of erythema when I saw him earlier in the week on the posterior heel although that is resolved as of today as well. He has been using a cam walker. This is a patient who came to [place] after a transmetatarsal amputation that was necrotic and dehisced. He required revascularization percutaneously on 2 different occasions by Dr. Andree Elk of invasive cardiology at [facility]. 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 He went to [facility] infectious disease and had IV daptomycin for 5 weeks before developing eosinophilic pneumonitis and requiring an admission to hospital/ICU. He made a good recovery and is continued on doxycycline  since. As mentioned his foot is closed now. He has a follow-up with Dr. Andree Elk tomorrow. He is going to [employer] on Monday for a custom-made shoe READMISSION Last visit Dr. Andree Elk [facility] 02/16/17 1. Critical limb ischemia of the RLE:  s/p transmetatarsal amputation of the RLE, now completely healed.  s/p right ATA percutaneous revascularization procedure x2, most recently 11/20/2016 s/p PTA of right AT 100% to less than 20% with a 2.5 x 200 balloon.  He does have some residual osteomyelitis, but given the wound is closed, the orthopedist has recommended to follow. He has a fitting for a special shoe coming up next week. He has completed a course of daptomycin and doxycycline and he has been discharged by ID. He has completed a course of hyperbaric therapy.  Continue Continue medical management with aspirin, Plavix and statin therapy. We will discuss ongoing Plavix therapy at follow-up in 6 months.  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 Given no symptoms, we will conservatively manage. He doesn't want to do any more invasive studies at this time, which is reasonable. The patient arrives today out of 2 concerns both on the right transmetatarsal site. 1 at the level of the reminiscent fifth metatarsal head and the other at roughly the first or second. Both of these look like dark subcutaneous discoloration probably subdermal bleeding. He is recently obtained new adaptive footwear for the right foot. He also has a callus on the left fifth dorsal toe however he follows with podiatry for this and I don't think this is any issue. ABIs in this clinic today were 0.66 on the right and 1.06 on the left. On entrance into our clinic initially this was 0.95 and 0.92. He does not describe current claudication. They're going away on a cruise in 8 weeks and I  think are trying to do the month is much as they can proactively. They follow with podiatry and have an appointment with Dr. Andree Elk in October READMISSION 06/25/18 This is a patient that we have not seen in almost a year. He is a type II diabetic with known PAD.  He is followed by Dr. Andree Elk of interventional cardiology at [facility] in [place]. He is required revascularization for significant PAD. When we first saw him he required a transmetatarsal amputation. He had underlying osteomyelitis with a nonhealing surgical wound. This eventually closed with wound care, IV antibiotics and hyperbaric oxygen. They tell me that he has a modified shoe and he is very active walking up to 4 miles a day. He is followed by Dr. Geroge Baseman of podiatry. His wife states that he underwent a removal of callus over this site on July 9. She felt there may be drainage from this site after that although she could never really determined and there was callus buildup again. On 06/01/18 there was pressure bleeding through the overlying callus. he was given a prescription for 7 days of Bactrim. Fortuitously he has an appointment with Dr. Andree Elk on 06/04/18 and he immediately underwent revascularization of the right leg although I have not had a chance to review these records in care everywhere. This was apparently done through anterior and retrograde access. On 06/06/18 he had another debridement by Dr. Bernette Mayers. Vitamin soaking with Epsom salts for 20 minutes and applying calcium alginate. The original trans-met was on April 2017 I believe by Dr. Doran Durand. His ABI in our clinic was noncompressible today. 07/02/18; x-ray I ordered last week was negative for osteomyelitis. Swab culture was also negative. He is going to require an MRI which I have ordered today. 07/09/18; surprisingly the MRI of the foot that I ordered did not show osteomyelitis. He did suggest the possibility of cellulitis. For this reason I'll go ahead and give him a 10 day  course of doxycycline. Although the previous culture of this area was negative 07/16/18; it arrives with the wound looking much the same. Roughly the same depth. He has thick subcutaneous tissue around the wound orifice but this still has roughly the same depth. Been using silver alginate. We applied Oasis #1 today 07/23/2018; still having to remove a lot of callus and thick subcutaneous tissue to actually define the wound here. Most of this seems to have closed down yet he has a comma shaped divot over the top of the area that still I think is open. We applied Oasis #2 His wife expressed concern about the tip of his left great toe. This almost looks like a small blister. She also showed it today to her podiatrist Dr. Geroge Baseman who did not think this was anything serious. I am not sure is anything serious either however I think it bears some watching. He is not in any pain however he is insensate 07/30/2018;; still on a lot of nonviable tissue over the surface of the wound however cleaning this up reveals a more substantial wound orifice but less of a probing wound depth. This is not probed to bone. There is no evidence of infection The wife is still concerned about a non-open area on the tip of his left great toe. Almost feels like a bony outgrowth. She had previously showed this to podiatry. I do not think this is a blister. A friction area would be possible although he is really not walking according to his wife. He had a small skin tag on his right buttock but no open wound here either 08/06/2018; we applied a third Oasis last week. Unfortunately there is really no improvement. Still requiring extensive debridement to expose the wound bed from a horizontal slitlike depression. I still have not been able to get this to fill in  properly. On the positive side there is now no probable bone from when he first came into the facility. I changed him to silver alginate today after a reasonably aggressive  debridement 08/13/2018; once again the patient comes in with skin and subcutaneous tissue closing over the small probing area with the underlying cavity of the wound on the right TMA site. I applied silver alginate to this last week. Prior to that we used Oasis x3 still not able to get this area granulating. He does not have a probing area of the bone which is an improvement from when I spur started working on this and an MRI did not suggest osteomyelitis. I do not see evidence of infection here but I am increasingly concerned about why I cannot get this area to granulate. Each time I debrided this is looks like this is simply a matter of getting granulation to fill in the hole and then getting epithelialization. This does not seem to happen Also I sent him back to see podiatry Dr. Earleen Newport about the what felt to be bony outgrowth on the tip of his left great toe. Apparently after heel he left the clinic last week or the next day he developed a blood blister. He did see Dr. Earleen Newport. He went on to have a debridement of the medial nail cuticle he now has an open area here as well as some denuded skin. They did an x-ray apparently does have a bony outgrowth or spur but I am not able to look at this. They are using topical antibiotics apparently there was some suggested he use Santyl. Patient's wife was anxious for my opinion of this 08/20/2018; we are able to keep the wound open this time instead of the thick subcutaneous tissue closing over the top of it however unfortunately once again this probes to bone. I do not see any evidence of infection and previous MRI did not show osteomyelitis. I elected to go back to the Oasis to see if we can stimulate some granulation. Last saw his vascular interventional cardiologist Dr. Andree Elk at the beginning of August and he had a repeat procedure. Nevertheless I wonder how much blood flow he has down to this area With regards to the left first toe he is seeing Dr.  Earleen Newport next week. They are applying Bactroban to this area. 08/27/2018 ooOnce again he comes in with thick eschar and subcutaneous tissue over the top of the small probing hole. This does not appear to go down to bone but it still has roughly the same depth. I reapplied Oasis today ooOver the left great toe there appears to be more of the wound at the tip of his toe than there was last week. This has an eschar on the surface of it. Will change to Santyl. There is seeing podiatry this afternoon 09/03/2018 ooHe comes in today with the area on the transmetatarsal's site with a fair amount of callus, nonviable tissue over the circumference but it was not closed. I removed all of this as well as some subcutaneous debris and reapplied Oasis. There is no exposed bone ooThe area over the tip of the left great toe started off as a nodule of uncertain etiology. He has been followed with podiatry. They have been removing part of the medial nail bed. He has nonviable tissue over the wound he been using Santyl in this area 09/10/18 ooUnfortunately comes in with neither wound area looking improved. The transmetatarsal amputation site once Again has nonviable debris over the  surface requiring debridement. Unfortunately underneath this there is nothing that looks viable and this once again goes right down to bone. There is no purulent drainage and no erythema. ooAlso the surgical wound from podiatry on the left first toe has an ischemic-looking eschar over the surface of the tip of the toe I have elected not to attempt candidly debride this ooHis wife as arranged for him to have follow-up noninvasive studies in [place] under the care of Dr. Andree Elk clinic. The question is he has known severe PAD. They had recently seen him in August. He has had revascularizations in both legs within the last 4 or 5 months. He is not complaining of pain and I cannot really get a history of claudication. He has not  systemically unwell 09/20/2018 the patient has been to [facility] and been revascularized by Dr. Andree Elk earlier this week. Apparently he was able to open up the anterior tibial artery although I have not actually seen his formal report. He is going for an attempt to revascularize on the left on Monday. He is apparently working with an investigational stent for lower extremity arteries below the knee and he has talked to the patient about placing that on Monday if possible. The patient has been using silver alginate on the transmetatarsal amputation site and Santyl on the left 09/30/2018; patient had his revascularization on the left this apparently included a standard approach as well as a more distal arterial catheterization although I do not have any information on this from Dr. Andree Elk. In fact I do not even see the initial revascularization that he had on the right. I have included the arterial history from Dr. Andree Elk last note however below; ASSESSMENT/PLAN: 1. Hx of Critical limb ischemia bilateral lower extremities, PAD: -s/p transmetatarsal amputation of the RLE. -s/p right ATA percutaneous revascularization procedure x2, most recently 11/20/2016 s/p PTA of right AT 100% to less than 20% with a 2.5 x 200 balloon. 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 -03/21/2018 s/p PTA of 90% left popliteal artery to <10% with a 5x20 cutting balloon, PTA of 90% left peroneal to <20% with a 3x20 balloon, PTA of 100% left AT to <20% with a 2.5x220 balloon. 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 Will plan on the LLE soon after. Risks/benefits of procedure have been discussed and patient has elected to proceed. We have been  using endoform to the right TMA amputation site wound and Santyl to the left great toe 10/07/2018; the area on the tip of his left great toe looked better we have been using Santyl here. We continue to have a very difficult probing hole on the right TMA amputation site we have been using endoform. I went on to use his fifth Oasis today 10/14/2018; the tip of the left great toe continues to look better. We have been using Santyl here the surface however is healthy and I think we can change to an alginate. The right TMA has not changed. Once again he has no superficial opening there is callus and thick subcutaneous tissue over the orifice once you remove this there is the probing area that we have been dealing with without too much change. There is no palpable bone I have been placing  Oasis here and put Oasis #6 in this today after a more vigorous debridement 10/21/18; the left great toe still has necrotic surface requiring debridement. The right TMA site hasn't changed in view of the thick callus over the wound bed. With removal of this there is still the opening however this does not appear to have the same depth. Again there is no palpable bone. Oasis was replaced 10/28/2018; patient comes in with both wounds looking worse. The area over the first toe tip is now down to bone. The area over the TMA site is deeper and down to bone clearly with a increase in overall wound area. Equally concerning on the right TMA is the complete absence of a pulse this week which is a change. We are not even able to Doppler this. On the right he has a noncompressible ABI greater than 1.4 11/04/2018. Both wounds look somewhat worse. X-rays showed no osteomyelitis of the right foot but on the left there was underlying osteomyelitis in the left great toe distal phalanx. I been on the phone to Dr. Andree Elk surface at [facility] in [place] and they are arranging for another angiogram on the right on Monday. I have him on  doxycycline for the osteomyelitis in the left great toe for now. Infectious disease may be necessary 1/17; 2-week hiatus. Patient was admitted to hospital at [place]. My understanding is he underwent an angioplasty of the right anterior tibial artery and had stents placed in the left anterior artery and the left tibial peroneal trunk. This was done by Dr. Andree Elk of interventional radiology. There is no major change in either 1 of the wounds. They have been using Aquacel Ag. As far as they are aware no imaging studies were done of the foot which is indeed unfortunate. I had him on doxycycline for 2 weeks since we identified the osteomyelitis in the left great toe by plain x-ray. I have renewed that again today. I am still suspicious about osteomyelitis in the amputation site and would consider doing another MRI to compare with the one done in September. The idea of hyperbaric oxygen certainly comes up for discussion 1/24; no major change in either wound area. I have him on doxycycline for osteomyelitis at the tip of the left great toe. As noted he has been previously and recently revascularized by Dr. Andree Elk at [place]. He is tolerating the doxycycline well. For some reason we do not have an infectious disease consult yet. Culture of drainage from the right foot site last week was negative 1/31; MRI of the right foot did not show osteomyelitis of the right ankle and foot. Notable for a skin ulceration overlying the second metatarsal stump with generalizing soft tissue edema of the ankle and foot consistent with cellulitis. Noted to have a partial-thickness tear of the Achilles tendon 7.5 cm proximal to the insertion Nothing really new in terms of symptoms. Patient's area on the tip of the left great toe is just about closed although he still has the probing area on the metatarsal amputation site. Appointment with Dr. Linus Salmons of infectious disease next week 2/7; Dr. Novella Olive did not feel that any further  antibiotics were necessary he would follow-up in 2 months. The left great toe appears to be closed still some surface callus that I gently looked under high did not see anything open or anything that was threatening to be open. He still has the open area on the mid part of his TMA site using endoform 2/14; left great  toe is closed and he is completing his doxycycline as of last Sunday. He is not on any antibiotics. Unfortunately out of the right foot his wife noticed some subdermal hemorrhage this week. He had been walking 2 miles I had given him permission to do so this is not really a plantar wound. Using endoform to this wound but I changed to silver alginate this week 2/21; left great toe remains closed. Culture last week grew Streptococcus angiosis which I am not really familiar with however it is penicillin sensitive and I am going to put him on Augmentin. Not much change in the wound on the right foot the deep area is still probing precariously close to bone and the wound on the margin of the TMA is larger. 2/28; left great toe remains closed. He is completing the Augmentin I gave him last week. 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 This is being shown to Dr. Andree Elk at [place] to see if there is anything else that can be done here. He has been using silver alginate strips on the right 3/6; left great toe remains closed. He sees Dr. Andree Elk on Monday. The area on the plantar aspect of the right foot has the same small orifice with thick callused tissue around this. However this time with removal of the callus tissue the wound is open all the way along the incision line to the end medially. We have been using silver alginate. 3/13; left toe remains closed although the area is callused. He sees Dr. Jacqualyn Posey of podiatry next week. The area on the plantar right foot looked a lot better this week. Culture I did of this was negative we use silver alginate. He  is going next week for an attempt at revascularization by Dr. Andree Elk 3/23; left toe remains closed although the area is callused. He will see Dr. Jacqualyn Posey in follow-up. The area on the right plantar foot continues to look surprisingly better over the last 3 visits. We have been using silver alginate. The revascularization he was supposed to have by Dr. Andree Elk at [facility] in [place] has been canceled [facility] procedure] 4/6; the right foot remains closed albeit callused. Podiatry canceled the appointment with regards to the left great toe. He has not seen Dr. Andree Elk at [facility] but thinks that Dr. Andree Elk has "done all he can do". He would be a candidate for the hemostaemix trial Readmission 07/29/2019 Mr. [name] is a man we know well from at least 3 previous stays in this clinic. He is a type II diabetic with severe PAD followed by Dr. Andree Elk at [facility] in [place]. 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 So far everything in both of these areas has remained closed. About 2 weeks ago he went to see his podiatrist at [facility] Dr. Babs Bertin. He had a thick callus on the left fifth metatarsal head that was shaved. He has developed an open wound in this area. They have been offloading this in his diabetic shoes. The patient has not had any more revascularizations by Dr. Andree Elk since the last time he was here. ABI in our clinic at the posterior tibial on the left was 1.06 10/6; no real change in the area on the plantar met head. Small wound with 2 mm of depth. He has thick skin  probably from pressure around the wound. We have been using silver alginate 10/13; small wound in the left fifth plantar met head. Arrives today with undermining laterally and purulent drainage. Our intake nurse cultured this. His wife stated they noticed a change in color over the last day or  2. He is not systemically unwell 10/19; small wound on the fifth plantar metatarsal head. Culture I did last week showed Staphylococcus lugdunensis. Although this could be a skin contaminant the possibility of a skin and soft tissue infection was there. I did give him empiric doxycycline which should have covered this. We are using silver alginate to the wound. We put him in a total contact cast today. 10/22; small wound on the fifth plantar metatarsal head. He has completed antibiotics. He also has severe PAD which worries me about just about any wound on this man's foot 10/29; small superficial area on the fifth plantar metatarsal head. Measuring slightly smaller. He is not currently on any antibiotics. I been using a total contact cast in this man with very severe PAD but he seems to be tolerating this well 11/5; left plantar fifth metatarsal head. Silver alginate being used under a total contact cast 11/12; wound not much different than last week. Using a #15 scalpel debridement around the wound. Still silver collagen under a total contact cast. He has severe PAD and that may be playing a role in this 11/19; disappointing that the wound is not really changed that much. I think it is come down in overall surface area because originally this was on the lateral part of the fifth metatarsal head however recently it is not really changed. Most of this is filled in but it will not epithelialized there is surface debris on this which may be mostly related to ischemia. They have an appointment with Dr. Andree Elk on 12/9 09/24/2019 on evaluation today patient appears to be doing somewhat better with regard to the wound on the left fifth metatarsal head. Fortunately there does not appear to be any signs of active infection at this time. No fevers, chills, nausea, vomiting, or diarrhea. The wound does not appear to be completely closed and I do feel like the Hydrofera Blue is helping to some degree  although I feel like the cast as well as keeping things from breaking down or getting any larger at least. As far as his wound overview it shows that the overall size of the wound is slightly smaller but really maintaining within the realm of about the same. He had no troubles with the cast which is good news. 12/1; left fifth metatarsal head. Perhaps somewhat more vibrant. We have been using Hydrofera Blue. He sees Dr. Andree Elk at [place] 1 week tomorrow. He be back next week and I hope to have a better idea which way the wound is going however I will not cast him next week in case Dr. Andree Elk wants to reevaluate things in his foot. The left leg is the leg with the stent in place and I wonder whether these are going to have to be reevaluated. 12/8; left fifth metatarsal head. Quite a bit better this week. Only a small open area remains. He sees Dr. Andree Elk at [place] tomorrow. Dr. Andree Elk is previously done his vascular interventions. He has 2 stents in the left leg as I remember things. I therefore will not put him in a total contact cast today. He will be using a forefoot off loader. We will use silver collagen on the wound.  We will see him again next week 12/15; left fifth metatarsal head. He saw Dr. Andree Elk and is scheduled for an angiogram on Thursday. Unfortunately although his wound was a lot better last week it is really deteriorated this week. Small punched-out hole with depth and overhanging tissue. We have been using Hydrofera Blue 12/22; patient had an 80% stenosis proximal to the stent I believe in the below-knee popliteal artery. This was opened by Dr. Andree Elk. According the patient the stent itself was satisfactory. I have not been able to review this note. 12/29; patient arrives today with the wound about the same size some undermining medially. We have been using Hydrofera Blue under a total contact cast and that is what we will do again today 11/04/2019. Slightly smaller in orifice but about 4 mm  undermining almost circumferentially. We have been using Hydrofera Blue. Apparently the Hydrofera Blue did not stay on the wound after we removed the cast. Some minor looking cast irritation on the right lateral 1/12; unfortunately things did not go well this week. Arrives in clinic today with a deeper wound with undermining more concerning a area of pus. Specimen was obtained for culture. We are not going to be able to put him in a cast today. 1/19; purulent material last week which I cultured showed Enterobacter cloacae. He was on ampicillin previously prescribed by his primary doctor which should not have covered this however mysteriously the wound looks somewhat better. There is less depth less erythema certainly no drainage. He will need a course of ciprofloxacin which I will provide today. 1/26; he has completed the ciprofloxacin. I don't think he needs any additional antibiotics. The areas on the left fifth plantar met head. We've been using silver alginate 2/2; comes in today with 0.4 cm of circumferential undermining around a small wound in this area. We have been using silver alginate with a forefoot offloading boot 2/9; again the wound appears larger nonviable surface and with 0.4 cm roughly of circumferential undermining. We have been using silver alginate with a forefoot offloading boot. The wound does not look infected however his wife is quick to point out about swelling on the dorsal foot. 2/16; not much change. Comes in with a small wound with a nonviable necrotic surface. Again marked undermining that I had totally removed last week. He had a arterial Doppler last week in Dr. Andree Elk office at [facility]. They have not heard these results. They are somewhat frustrated. 2/23; if anything this is worse. Undermining increased depth. Nonviable surface that looks pale. According his wife is TBI on the right was 0.22 although I have not verified this. He is going for an angiogram  with Dr. Andree Elk next Monday. We are using polymen and offloading in a forefoot offloading boot. 3/2; the patient was revascularized yesterday by [facility]. He had successful PTA of the left peroneal 60% to less than 20% and successful PTA of the left ATA 100% to less than 20%. He now has a pulse in the left foot Now that he has some additional blood flow there is not a lot of options here. On one hand we want activity to help keep blood flow augmented on the other hand pressure relief is going to be paramount if we have any chance to get this to close. After some discussion with his wife and patient I went ahead and put him in a total contact cast. 3/9; he arrives in clinic today with some debris over the wound  surface. We use silver collagen under a total contact cast after his revascularization by Dr. Andree Elk Objective Constitutional Vitals Time Taken: 12:49 PM, Height: 69 in, Source: Stated, Weight: 210 lbs, Source: Stated, BMI: 31, Temperature: 98.6 F, Pulse: 84 bpm, Respiratory Rate: 18 breaths/min, Blood Pressure: 104/68 mmHg, Capillary Blood Glucose: 124 mg/dl. General Notes: glucose per pt report Cardiovascular Recklessly is dorsalis pedis pulses palpable on the left foot. General Notes: Wound exam; the patient has a slough surface on this. I gently remove this removing slough with Anasept and gauze. It seems to come off quite easily.Marland Kitchen He does not have any exposed bone there is some bleeding. Integumentary (Hair, Skin) Wound #5 status is Open. Original cause of wound was Gradually Appeared. The wound is located on the Left Metatarsal head fifth. The wound measures 1.1cm length x 1.1cm width x 0.5cm depth; 0.95cm^2 area and 0.475cm^3 volume. There is Fat Layer (Subcutaneous Tissue) Exposed exposed. There is undermining starting at 12:00 and ending at 4:00 with a maximum distance of 0.4cm. There is a medium amount of serosanguineous drainage noted. The wound margin is  well defined and not attached to the wound base. There is small (1-33%) red granulation within the wound bed. There is a large (67-100%) amount of necrotic tissue within the wound bed including Adherent Slough. 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 There was a Total Contact Cast Procedure by Ricard Dillon., MD. Post procedure Diagnosis Wound #5: Same as Pre-Procedure Plan Follow-up Appointments: Return Appointment in 1 week. Dressing Change Frequency: Wound #5 Left Metatarsal head fifth: Do not change entire dressing for one week. Wound Cleansing: Wound #5 Left Metatarsal head fifth: May shower with protection. Primary Wound Dressing: Wound #5 Left Metatarsal head fifth: Silver Collagen - moisten with hydrogel Other: - pad right great toe for protection Secondary Dressing: Dry Gauze Off-Loading: Total Contact Cast to Left Lower Extremity #1 continue with silver collagen hydrogel, gauze padding. Total contact cast 2. The wound looks better this week, encouraging. Certainly supported the idea of putting him back in a cast which is not without risks in his case Electronic Signature(s) Signed: 01/06/2020 1:22:57 PM By: [name] MD Entered By: [name] on 01/06/2020 13:22:57 -------------------------------------------------------------------------------- Total Contact Cast Details Patient Name: Date of Service: [name], [name] 01/06/2020 12:30 PM Medical Record [id number] Patient Account Number: [id number] Date of Birth/Sex: Mar 28, 1946 (73 y.o. M) Treating RN: [name] Primary Care  Provider: [name] Other Clinician: Referring Provider: Treating Provider/Extender:Floy Riegler, [name], [name] in Treatment: 23 Total Contact Cast Applied for Wound Assessment: Wound #5 Left Metatarsal head fifth Performed By: Physician Ricard Dillon., MD Post Procedure Diagnosis Same as Pre-procedure Electronic Signature(s) Signed: 01/06/2020 5:48:31 PM By: [name] MD Entered By: [name] on 01/06/2020 13:19:01 -------------------------------------------------------------------------------- SuperBill Details Patient Name: Date of Service: [name], [name] 01/06/2020 Medical Record [id number] Patient Account Number: [id number] Date of Birth/Sex: Treating RN: January 12, 1946 (73 y.o. [name] Primary Care Provider: [name] Other Clinician: Referring Provider: Treating Provider/Extender:Pricella Gaugh, [name], [name] in Treatment: 23 Diagnosis Coding ICD-10 Codes Code Description E11.621 Type 2 diabetes mellitus with foot ulcer E11.51 Type 2 diabetes mellitus with diabetic peripheral  angiopathy without gangrene L97.521 Non-pressure chronic ulcer of other part of left foot limited to breakdown of skin L03.116 Cellulitis of left lower limb Facility Procedures CPT4 Code: [id number] Description: [phone] - APPLY TOTAL CONTACT LEG CAST ICD-10 Diagnosis Description E11.621 Type 2 diabetes mellitus with foot ulcer Modifier: Quantity: 1 Physician Procedures CPT4 Code: [id number] Description: [id number] - WC PHYS APPLY TOTAL CONTACT CAST ICD-10 Diagnosis Description E11.621 Type 2 diabetes mellitus with foot ulcer Modifier: Quantity: 1 Electronic Signature(s) Signed: 01/06/2020 5:48:31 PM By: [name] MD Entered By: [name] on 01/06/2020 13:23:10

## 2020-01-07 NOTE — Progress Notes (Addendum)
AMI, THORNSBERRY ([id number]) Visit Report for 01/06/2020 Arrival Information Details Patient Name: Date of Service: [name], [name] 01/06/2020 12:30 PM Medical Record [name]:[id number] Patient Account Number: [id number] Date of Birth/Sex: Treating RN: 01-Sep-1946 (74 y.o. [name], [name] Primary Care [name]: [name] Other Clinician: Referring Aceton Kinnear: Treating [name]/Extender:[name], [name], [name] in Treatment: 23 Visit Information History Since Last Visit Added or deleted any medications: No Patient Arrived: Ambulatory Any new allergies or adverse reactions: No Arrival Time: 12:47 Had a fall or experienced change in No Accompanied By: spouse activities of daily living that may affect Transfer Assistance: None risk of falls: Patient Identification Verified: Yes Signs or symptoms of abuse/neglect since No Secondary Verification Process Yes last visito Completed: Hospitalized since last visit: No Patient Requires Transmission-Based No Implantable device outside of the clinic No Precautions: excluding Patient Has Alerts: No cellular tissue based products placed in the center since last visit: Has Dressing in Place as Prescribed: Yes Has Footwear/Offloading in Place as Yes Prescribed: Left: Total Contact Cast Pain Present Now: No Electronic Signature(s) Signed: 01/06/2020 6:17:27 PM By: Todd Deed RN, BSN Entered By: [name] on 01/06/2020 12:48:36 -------------------------------------------------------------------------------- Encounter Discharge Information Details Patient Name: Date of Service: [name], [name] 01/06/2020 12:30 PM Medical Record [id number] Patient Account Number: [id number] Date of Birth/Sex: Treating RN: 08-08-1946 (74 y.o. [name] Primary Care Yailine Ballard: [name] Other Clinician: Referring [name]: Treating [name]/Extender:[name], [name], [name] in Treatment: 23 Encounter  Discharge Information Items Discharge Condition: Stable Ambulatory Status: Ambulatory Discharge Destination: Home Transportation: Private Auto Accompanied By: wife Schedule Follow-up Appointment: Yes Clinical Summary of Care: Patient Declined Electronic Signature(s) Signed: 01/06/2020 5:46:17 PM By: [name] Entered By: [name] on 01/06/2020 13:30:21 -------------------------------------------------------------------------------- Lower Extremity Assessment Details Patient Name: Date of Service: [name], [name] 01/06/2020 12:30 PM Medical Record [id number] Patient Account Number: [id number] Date of Birth/Sex: Treating RN: 27-Nov-1945 (73 y.o. [name] Primary Care Javyon Fontan: [name] Other Clinician: Referring [name]: Treating [name]/Extender:[name], [name], [name] in Treatment: 23 Edema Assessment Assessed: [Left: No] [Guzman: No] Edema: [Left: Ye] [Guzman: s] Calf Left: Guzman: Point of Measurement: cm From Medial Instep 40 cm cm Ankle Left: Guzman: Point of Measurement: cm From Medial Instep 30 cm cm Vascular Assessment Pulses: Dorsalis Pedis Palpable: [Left:Yes] Electronic Signature(s) Signed: 01/06/2020 6:17:27 PM By: Todd Deed RN, BSN Entered By: [name] on 01/06/2020 12:59:56 -------------------------------------------------------------------------------- Multi Wound Chart Details Patient Name: Date of Service: [name], [name] 01/06/2020 12:30 PM Medical Record [id number] Patient Account Number: [id number] Date of Birth/Sex: Treating RN: Mar 08, 1946 (73 y.o. 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] Comorbid History: [5:Cataracts, Chronic sinus N/A problems/congestion, Coronary Artery Disease, Hypertension, Peripheral Arterial Disease, Type II Diabetes, Osteoarthritis, Neuropathy] Date Acquired: [5:07/14/2019] [N/A:N/A] Weeks of Treatment: [5:23] [N/A:N/A] Wound Status: [5:Open] [N/A:N/A] Measurements L x W x D 1.1x1.1x0.5 [N/A:N/A] (cm) Area (cm) : [5:0.95] [N/A:N/A] Volume (cm) : [5:0.475] [N/A:N/A] % Reduction in Area: [5:-1407.90%] [N/A:N/A] % Reduction in Volume: -3553.80% [N/A:N/A] Starting Position 1 12 (o'clock): Ending Position 1 [5:4] (o'clock): Maximum Distance 1 [5:0.4] (cm): Undermining: [5:Yes] [N/A:N/A] Classification: [5:Grade 2] [N/A:N/A] Exudate Amount: [5:Medium] [N/A:N/A] Exudate Type: [5:Serosanguineous] [  N/A:N/A] Exudate Color: [5:red, brown] [N/A:N/A] Wound Margin: [5:Well defined, not attached N/A] Granulation Amount: [5:Small (1-33%)] [N/A:N/A] Granulation Quality: [5:Red] [N/A:N/A] Necrotic Amount: [5:Large (67-100%)] [N/A:N/A] Exposed Structures: [5:Fat Layer (Subcutaneous Tissue) Exposed: Yes Fascia: No Tendon: No Muscle: No Joint: No Bone: No] [N/A:N/A] Epithelialization: [5:None Total Contact Cast] [N/A:N/A N/A] Treatment Notes Electronic Signature(s) Signed: 01/06/2020 5:48:31 PM By: Todd Ham MD Signed: 01/07/2020 7:20:02 AM By: Todd Coria RN Entered By: [name] on 01/06/2020 13:18:50 -------------------------------------------------------------------------------- Multi-Disciplinary Care Plan Details Patient Name: Date of Service: [name], [name] 01/06/2020 12:30 PM Medical Record [id number] Patient Account Number: [id number] Date of Birth/Sex: Treating RN: 07-28-1946 (73 y.o. [name] Primary Care [name]: [name] Other Clinician: Referring [name]: Treating Simrah Chatham/Extender:[name], [name], [name] in Treatment: 23 Active Inactive Wound/Skin Impairment Nursing Diagnoses: Knowledge deficit related to ulceration/compromised skin integrity Goals: Patient/caregiver will verbalize understanding of skin care regimen Date Initiated: 07/29/2019 Target Resolution Date: 01/30/2020 Goal Status: Active Ulcer/skin breakdown will have a volume reduction of 30% by week 4 Date Inactivated: 10/21/2019 Target12/08/2019 Resolution Date Initiated: 07/29/2019 Date: Unmet Reason: comorbities. Goal Status: Unmet blood flow Ulcer/skin breakdown will have a volume reduction of 50% by week 8 Date Initiated: 10/21/2019 Date Inactivated: 12/02/2019 Target Resolution Date: 11/21/2019 Goal Status: Unmet Unmet Reason: comorbities Ulcer/skin breakdown will have a volume reduction of 80% by week 12 Date Initiated: 12/02/2019 Date Inactivated: 01/06/2020 Target Resolution Date: 01/02/2020 Goal Status: Unmet Unmet Reason: comorbities Interventions: Assess patient/caregiver ability to obtain necessary supplies Assess patient/caregiver ability to perform ulcer/skin care regimen upon admission and as needed Assess ulceration(s) every visit Notes: Electronic Signature(s) Signed: 01/07/2020 7:20:02 AM By: Todd Coria RN Entered By: [name] on 01/06/2020 13:11:08 -------------------------------------------------------------------------------- Pain Assessment Details Patient Name: Date of Service: [name], [name] 01/06/2020 12:30 PM Medical Record [id number] Patient Account Number: [id number] Date of Birth/Sex: Treating RN: November 19, 1945 (74 y.o. [name] Primary Care Saphyra Hutt: [name] Other Clinician: Referring Adessa Primiano: Treating Yasin Ducat/Extender:[name], [name], [name] in Treatment: 23 Active Problems Location of Pain Severity  and Description of Pain Patient Has Paino Yes Site Locations Pain Location: Pain in Ulcers With Dressing Change: No Duration of the Pain. Constant / Intermittento Constant Rate the pain. Current Pain Level: 0 Worst Pain Level: 4 Least Pain Level: 0 Character of Pain Describe the Pain: Tender, Throbbing Pain Management and Medication Current Pain Management: Other: time Is the Current Pain Management Adequate: Adequate How does your wound impact your activities of daily livingo Sleep: No Bathing: No Appetite: No Relationship With Others: No Bladder Continence: No Emotions: No Bowel Continence: No Work: No Toileting: No Drive: No Dressing: No Hobbies: No Electronic Signature(s) Signed: 01/06/2020 6:17:27 PM By: Baruch Gouty RN, BSN Entered By: Baruch Gouty on 01/06/2020 13:00:41 -------------------------------------------------------------------------------- Patient/Caregiver Education Details Patient Name: Date of Service: [name] 3/9/2021andnbsp12:30 PM Medical Record [id number] Patient Account Number: [id number] Date of Birth/Gender: 06/06/1946 (74 y.o. M) Treating RN: [name] Primary Care Physician: [name] Other Clinician: Referring Physician: Treating Physician/Extender:[name], [name], [name] in Treatment: 23 Education Assessment Education Provided To: Patient Education Topics Provided Wound/Skin Impairment: Methods: Explain/Verbal Responses: State content correctly Electronic Signature(s) Signed: 01/07/2020 7:20:02 AM By: Todd Coria RN Entered By: [name] on 01/06/2020 13:11:29 -------------------------------------------------------------------------------- Wound Assessment Details Patient Name: Date of Service: [name], [name] 01/06/2020 12:30 PM Medical Record [id number] Patient Account Number: [id number] Date of Birth/Sex: Treating RN: 1945/11/06 (73 y.o. [name] Primary Care Mukesh Kornegay:  [name] Other Clinician: Referring Luiz Trumpower: Treating Deyra Perdomo/Extender:[name], [name],  [name] in Treatment: 23 Wound Status Wound Number: 5 Primary Diabetic Wound/Ulcer of the Lower Extremity Etiology: Wound Location: Left Metatarsal head fifth Wound Open Wounding Event: Gradually Appeared Status: Date Acquired: 07/14/2019 Comorbid Cataracts, Chronic sinus problems/congestion, Weeks Of Treatment: 23 History: Coronary Artery Disease, Hypertension, Clustered Wound: No Clustered Wound: No Peripheral Arterial Disease, Type II Diabetes, Osteoarthritis, Neuropathy Photos Wound Measurements Length: (cm) 1.1 % Reduction i Width: (cm) 1.1 % Reduction i Depth: (cm) 0.5 Epithelializa Area: (cm) 0.95 Undermining: Volume: (cm) 0.475 Starting Ending Pos Maximum Di n Area: -1407.9% n Volume: -3553.8% tion: None Yes Position (o'clock): 12 ition (o'clock): 4 stance: (cm) 0.4 Wound Description Classification: Grade 2 Foul Odor Af Wound Margin: Well defined, not attached Slough/Fibri Exudate Amount: Medium Exudate Type: Serosanguineous Exudate Color: red, brown Wound Bed Granulation Amount: Small (1-33%) Granulation Quality: Red Fascia Expos Necrotic Amount: Large (67-100%) Fat Layer (S Necrotic Quality: Adherent Slough Tendon Expos Muscle Expos Joint Expose Bone Exposed ter Cleansing: No no Yes Exposed Structure ed: No ubcutaneous Tissue) Exposed: Yes ed: No ed: No d: No : No Treatment Notes Wound #5 (Left Metatarsal head fifth) 1. Cleanse With Wound Cleanser Soap and water 3. Primary Dressing Applied Collegen AG Hydrogel or K-Y Jelly 4. Secondary Dressing Dry Gauze Foam 5. Secured With Tape 7. Footwear/Offloading device applied Total Contact Cast Electronic Signature(s) Signed: 01/07/2020 2:27:25 PM By: Benjaman Kindler EMT/HBOT Signed: 01/07/2020 5:35:23 PM By: Todd Pax RN Previous Signature: 01/06/2020 6:17:27 PM Version By: Todd Deed RN, BSN Entered By: Benjaman Kindler on 01/07/2020 14:22:51 -------------------------------------------------------------------------------- Vitals Details Patient Name: Date of Service: [name], [name] 01/06/2020 12:30 PM Medical Record [id number] Patient Account Number: [id number] Date of Birth/Sex: Treating RN: 12-13-1945 (74 y.o. [name] Primary Care Lexander Tremblay: [name] Other Clinician: Referring Belle Charlie: Treating Unnamed Hino/Extender:[name], [name], [name] in Treatment: 23 Vital Signs Time Taken: 12:49 Temperature (F): 98.6 Height (in): 69 Pulse (bpm): 84 Source: Stated Respiratory Rate (breaths/min): 18 Weight (lbs): 210 Blood Pressure (mmHg): 104/68 Source: Stated Capillary Blood Glucose (mg/dl): 630 Body Mass Index (BMI): 31 Reference Range: 80 - 120 mg / dl Notes glucose per pt report Electronic Signature(s) Signed: 01/06/2020 6:17:27 PM By: Todd Deed RN, BSN Entered By: [name] on 01/06/2020 12:50:31

## 2020-01-13 ENCOUNTER — Encounter (HOSPITAL_BASED_OUTPATIENT_CLINIC_OR_DEPARTMENT_OTHER): Payer: Medicare Other | Admitting: Internal Medicine

## 2020-01-13 ENCOUNTER — Other Ambulatory Visit: Payer: Self-pay

## 2020-01-13 DIAGNOSIS — E11621 Type 2 diabetes mellitus with foot ulcer: Secondary | ICD-10-CM | POA: Diagnosis not present

## 2020-01-14 NOTE — Progress Notes (Signed)
[name], [name] ([id number]) Visit Report for 01/13/2020 Arrival Information Details Patient Name: Date of Service: [name], [name] 01/13/2020 12:30 PM Medical Record [id number] Patient Account Number: [id number] Date of Birth/Sex: Treating RN: September 29, 1946 (74 y.o. [name], [name] Primary Care Alycen Mack: [name] Other Clinician: Referring Vipul Cafarelli: Treating Quanesha Klimaszewski/Extender:[name], [name], [name] in Treatment: 24 Visit Information History Since Last Visit Added or deleted any medications: No Patient Arrived: Ambulatory Any new allergies or adverse reactions: No Arrival Time: 12:42 Had a fall or experienced change in No Accompanied By: spouse activities of daily living that may affect Transfer Assistance: None risk of falls: Patient Identification Verified: Yes Signs or symptoms of abuse/neglect since No Secondary Verification Process Yes last visito Completed: Hospitalized since last visit: No Patient Requires Transmission-Based No Implantable device outside of the clinic No Precautions: excluding Patient Has Alerts: No cellular tissue based products placed in the center since last visit: Has Dressing in Place as Prescribed: Yes Has Footwear/Offloading in Place as Yes Prescribed: Left: Total Contact Cast Pain Present Now: No Electronic Signature(s) Signed: 01/13/2020 5:21:02 PM By: [name] RN, BSN Entered By: [name] on 01/13/2020 12:42:43 -------------------------------------------------------------------------------- Encounter Discharge Information Details Patient Name: Date of Service: [name], [name] 01/13/2020 12:30 PM Medical Record [id number] Patient Account Number: [id number] Date of Birth/Sex: Treating RN: 1946-01-29 (74 y.o. [name] Primary Care Thamara Leger: [name] Other Clinician: Referring Brennan Karam: Treating Maybree Riling/Extender:[name], [name], [name] in Treatment: 24 Encounter  Discharge Information Items Post Procedure Vitals Discharge Condition: Stable Temperature (F): 98.6 Ambulatory Status: Ambulatory Pulse (bpm): 77 Discharge Destination: Home Respiratory Rate (breaths/min): 18 Transportation: Private Auto Blood Pressure (mmHg): 100/60 Accompanied By: wife Schedule Follow-up Appointment: Yes Clinical Summary of Care: Patient Declined Electronic Signature(s) Signed: 01/13/2020 5:07:08 PM By: Cherylin Mylar Entered By: Cherylin Mylar on 01/13/2020 14:01:57 -------------------------------------------------------------------------------- Lower Extremity Assessment Details Patient Name: Date of Service: [name], [name] 01/13/2020 12:30 PM Medical Record [id number] Patient Account Number: [id number] Date of Birth/Sex: Treating RN: April 04, 1946 (73 y.o. [name] Primary Care Angella Montas: [name] Other Clinician: Referring Kiante Ciavarella: Treating Shay Bartoli/Extender:[name], [name], [name] in Treatment: 24 Edema Assessment Assessed: [Left: No] [Right: No] Edema: [Left: Ye] [Right: s] Calf Left: Right: Point of Measurement: cm From Medial Instep 37.4 cm cm Ankle Left: Right: Point of Measurement: cm From Medial Instep 29.4 cm cm Vascular Assessment Pulses: Dorsalis Pedis Palpable: [Left:Yes] Electronic Signature(s) Signed: 01/13/2020 5:21:02 PM By: [name] RN, BSN Entered By: [name] on 01/13/2020 12:55:16 -------------------------------------------------------------------------------- Multi Wound Chart Details Patient Name: Date of Service: [name], [name] 01/13/2020 12:30 PM Medical Record [id number] Patient Account Number: [id number] Date of Birth/Sex: Treating RN: 1946/10/18 (74 y.o. 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] Comorbid History: [5:Cataracts, Chronic sinus N/A problems/congestion, Coronary Artery Disease, Hypertension, Peripheral Arterial Disease, Type II Diabetes, Osteoarthritis, Neuropathy] Date Acquired: [5:07/14/2019] [N/A:N/A] Weeks of Treatment: [5:24] [N/A:N/A] Wound Status: [5:Open] [N/A:N/A] Measurements L x W x D 0.8x0.6x0.5 [N/A:N/A] (cm) Area (cm) : [5:0.377] [N/A:N/A] Volume (cm) : [5:0.188] [N/A:N/A] % Reduction in Area: [5:-498.40%] [N/A:N/A] % Reduction in Volume: -1346.20% [N/A:N/A] Starting Position 1 12 (o'clock): Ending Position 1 [5:6] (o'clock): Maximum Distance 1 [5:0.4] (  cm): Undermining: [5:Yes] [N/A:N/A] Classification: [5:Grade 2] [N/A:N/A] Exudate Amount: [5:Medium] [N/A:N/A] Exudate Type: [5:Serosanguineous] [N/A:N/A] Exudate Color: [5:red, brown] [N/A:N/A] Wound Margin: [5:Well defined, not attached N/A] Granulation Amount: [5:Small (1-33%)] [N/A:N/A] Granulation Quality: [5:Pink, Pale] [N/A:N/A] Necrotic Amount: [5:Large (67-100%)] [N/A:N/A] Exposed Structures: [5:Fat Layer (Subcutaneous Tissue) Exposed: Yes Fascia: No Tendon: No Muscle: No Joint: No Bone: No] [N/A:N/A] Epithelialization: [5:None] [N/A:N/A] Debridement: [5:Debridement - Excisional] [N/A:N/A] Pre-procedure [5:13:04] [N/A:N/A] Verification/Time Out Taken: Pain Control: [5:Lidocaine 5% topical ointment] [N/A:N/A] Tissue Debrided: [5:Subcutaneous, Slough] [N/A:N/A] Level: [5:Skin/Subcutaneous Tissue] [N/A:N/A] Debridement Area (sq cm):0.48 [N/A:N/A] Instrument: [5:Curette] [N/A:N/A] Bleeding: [5:Moderate]  [N/A:N/A] Hemostasis Achieved: [5:Pressure] [N/A:N/A] Procedural Pain: [5:2] [N/A:N/A] Post Procedural Pain: [5:0] [N/A:N/A] Debridement Treatment Procedure was tolerated [N/A:N/A] Response: [5:well] Post Debridement [5:0.8x0.6x0.5] [N/A:N/A] Measurements L x W x D (cm) Post Debridement [5:0.188] [N/A:N/A] Volume: (cm) Procedures Performed: Debridement [5:Total Contact Cast] [N/A:N/A] Treatment Notes Electronic Signature(s) Signed: 01/13/2020 5:29:05 PM By: [name] MD Entered By: [name] on 01/13/2020 13:53:01 -------------------------------------------------------------------------------- Multi-Disciplinary Care Plan Details Patient Name: Date of Service: [name], [name] 01/13/2020 12:30 PM Medical Record [id number] Patient Account Number: [id number] Date of Birth/Sex: Treating RN: 01/16/1946 (73 y.o. [name] Primary Care Horace Wishon: [name] Other Clinician: Referring Tashia Leiterman: Treating Samael Blades/Extender:[name], [name], [name] in Treatment: 24 Active Inactive Wound/Skin Impairment Nursing Diagnoses: Knowledge deficit related to ulceration/compromised skin integrity Goals: Patient/caregiver will verbalize understanding of skin care regimen Date Initiated: 07/29/2019 Target Resolution Date: 01/30/2020 Goal Status: Active Ulcer/skin breakdown will have a volume reduction of 30% by week 4 Date Inactivated: 10/21/2019 Target Resolution Date Initiated: 07/29/2019 Date: 10/10/2019 Unmet Reason: comorbities. Goal Status: Unmet blood flow Ulcer/skin breakdown will have a volume reduction of 50% by week 8 Date Initiated: 10/21/2019 Date Inactivated: 12/02/2019 Target Resolution Date: 11/21/2019 Goal Status: Unmet Unmet Reason: comorbities Ulcer/skin breakdown will have a volume reduction of 80% by week 12 Date Initiated: 12/02/2019 Date Inactivated: 01/06/2020 Target Resolution Date: 01/02/2020 Goal Status: Unmet Unmet Reason:  comorbities Interventions: Assess patient/caregiver ability to obtain necessary supplies Assess patient/caregiver ability to perform ulcer/skin care regimen upon admission and as needed Assess ulceration(s) every visit Notes: Electronic Signature(s) Signed: 01/14/2020 8:57:53 AM By: [name] RN Entered By: [name] on 01/13/2020 12:51:17 -------------------------------------------------------------------------------- Pain Assessment Details Patient Name: Date of Service: [name], [name] 01/13/2020 12:30 PM Medical Record [id number] Patient Account Number: [id number] Date of Birth/Sex: Treating RN: Feb 22, 1946 (74 y.o. [name] Primary Care Skylynn Burkley: [name] Other Clinician: Referring Rebbecca Osuna: Treating [name] [name]/Extender:[name], [name], [name] in Treatment: 24 Active Problems Location of Pain Severity and Description of Pain Patient Has Paino No Site Locations Rate the pain. Current Pain Level: 0 Pain Management and Medication Current Pain Management: Electronic Signature(s) Signed: 01/13/2020 5:21:02 PM By: Baruch Gouty RN, BSN Entered By: Baruch Gouty on 01/13/2020 12:45:19 -------------------------------------------------------------------------------- Patient/Caregiver Education Details Patient Name: Date of Service: [name] 3/16/2021andnbsp12:30 PM Medical Record Patient Account Number: [id number] Number: Treating RN: [name] Date of Birth/Gender: 18-Apr-1946 (73 y.o. Other Clinician: M) Treating [name] Primary Care Physician: [name] Physician/Extender: Referring Physician: Darolyn Rua in Treatment: 24 Education Assessment Education Provided To: Patient Education Topics Provided Wound/Skin Impairment: Methods: Explain/Verbal Responses: State content correctly Electronic Signature(s) Signed: 01/14/2020 8:57:53 AM By: [name] RN Entered By: [name] on 01/13/2020  12:51:48 -------------------------------------------------------------------------------- Wound Assessment Details Patient Name: Date of Service: [name], [name] 01/13/2020 12:30 PM Medical Record [id number] Patient Account Number: [id number] Date of Birth/Sex: Treating RN: 1945-11-16 (73 y.o. [name] Primary Care Sabrinia Prien:  [name] Other Clinician: Referring Lorynn Moeser: Treating Jelitza Manninen/Extender:[name], [name], [name] in Treatment: 24 Wound Status Wound Number: 5 Primary Diabetic Wound/Ulcer of the Lower Extremity Etiology: Wound Location: Left Metatarsal head fifth Wound Open Wounding Event: Gradually Appeared Status: Date Acquired: 07/14/2019 Comorbid Cataracts, Chronic sinus problems/congestion, Weeks Of Treatment: 24 History: Coronary Artery Disease, Hypertension, Clustered Wound: No Peripheral Arterial Disease, Type II Diabetes, Osteoarthritis, Neuropathy Wound Measurements Length: (cm) 0.8 % Reduction in A Width: (cm) 0.6 % Reduction in V Depth: (cm) 0.5 Epithelializatio Area: (cm) 0.377 Tunneling: Volume: (cm) 0.188 Undermining: Starting Posi Ending Positi Maximum Dista rea: -498.4% olume: -1346.2% n: None No Yes tion (o'clock): 12 on (o'clock): 6 nce: (cm) 0.4 Wound Description Classification: Grade 2 Foul Odor After Wound Margin: Well defined, not attached Slough/Fibrino Exudate Amount: Medium Exudate Type: Serosanguineous Exudate Color: red, brown Wound Bed Granulation Amount: Small (1-33%) Granulation Quality: Pink, Pale Fascia Exposed: Necrotic Amount: Large (67-100%) Fat Layer (Subcu Necrotic Quality: Adherent Slough Tendon Exposed: Muscle Exposed: Joint Exposed: Bone Exposed: Cleansing: No Yes Exposed Structure No taneous Tissue) Exposed: Yes No No No No Treatment Notes Wound #5 (Left Metatarsal head fifth) 1. Cleanse With Wound Cleanser Soap and water 3. Primary Dressing Applied Iodoflex 4.  Secondary Dressing Dry Gauze Foam 5. Secured With Tape 7. Footwear/Offloading device applied Total Contact Cast Notes foam donut Electronic Signature(s) Signed: 01/13/2020 5:21:02 PM By: [name] RN, BSN Entered By: [name] on 01/13/2020 13:00:06 -------------------------------------------------------------------------------- Vitals Details Patient Name: Date of Service: [name], [name] 01/13/2020 12:30 PM Medical Record [id number] Patient Account Number: [id number] Date of Birth/Sex: Treating RN: 07/09/1946 (74 y.o. [name] Primary Care Edie Darley: [name] Other Clinician: Referring Davyn Morandi: Treating Koralyn Prestage/Extender:[name], [name], [name] in Treatment: 24 Vital Signs Time Taken: 12:43 Temperature (F): 98.6 Height (in): 69 Pulse (bpm): 77 Source: Stated Respiratory Rate (breaths/min): 18 Weight (lbs): 210 Blood Pressure (mmHg): 100/60 Source: Stated Capillary Blood Glucose (mg/dl): 382 Body Mass Index (BMI): 31 Reference Range: 80 - 120 mg / dl Notes glucose per pt report this am Electronic Signature(s) Signed: 01/13/2020 5:21:02 PM By: [name] RN, BSN Entered By: [name] on 01/13/2020 12:43:33

## 2020-01-14 NOTE — Progress Notes (Signed)
Todd, Guzman ([id number]) Visit Report for 01/13/2020 Debridement Details Patient Name: Date of Service: [name], [name] 01/13/2020 12:30 PM Medical Record [id number] Patient Account Number: [id number] Date of Birth/Sex: May 05, 1946 (74 y.o. M) Treating RN: Primary Care Provider: [name] Other Clinician: Referring Provider: Treating Provider/Extender:Luverta Korte, [name], [name] in Treatment: 24 Debridement Performed for Wound #5 Left Metatarsal head fifth Assessment: Performed By: Physician Ricard Dillon., MD Debridement Type: Debridement Severity of Tissue Pre Fat layer exposed Debridement: Level of Consciousness (Pre- Awake and Alert procedure): Pre-procedure Verification/Time Out Taken: Yes - 13:04 Start Time: 13:04 Pain Control: Lidocaine 5% topical ointment Total Area Debrided (L x W): 0.8 (cm) x 0.6 (cm) = 0.48 (cm) Tissue and other material Viable, Non-Viable, Slough, Subcutaneous, Skin: Dermis , Skin: Epidermis, Slough debrided: Level: Skin/Subcutaneous Tissue Debridement Description: Excisional Instrument: Curette Bleeding: Moderate Hemostasis Achieved: Pressure End Time: 13:06 Procedural Pain: 2 Post Procedural Pain: 0 Response to Treatment: Procedure was tolerated well Level of Consciousness Awake and Alert (Post-procedure): Post Debridement Measurements of Total Wound Length: (cm) 0.8 Width: (cm) 0.6 Depth: (cm) 0.5 Volume: (cm) 0.188 Character of Wound/Ulcer Post Improved Debridement: Severity of Tissue Post Debridement: Fat layer exposed Post Procedure Diagnosis Same as Pre-procedure Electronic Signature(s) Signed: 01/13/2020 5:29:05 PM By: [name] MD Entered By: [name] on 01/13/2020 13:53:14 -------------------------------------------------------------------------------- HPI Details Patient Name: Date of Service: Todd, Guzman 01/13/2020 12:30 PM Medical Record [id number] Patient Account Number:  [id number] Date of Birth/Sex: Treating RN: 03/25/1946 (74 y.o. M) Primary Care Provider: [name] Other Clinician: Referring Provider: Treating Provider/Extender:Teresita Fanton, [name], [name] in Treatment: 24 History of Present Illness HPI Description: 05/18/16; this is a 74year-old diabetic who is a type II diabetic on insulin. The history is that he traumatized his right foot developed a sore sometime in late March. 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 According to the patient and his wife this wound never really healed. He was seen on 2 occasions in the wound care center in [place] and had vascular studies and then was referred urgently to Dr. Bridgett Larsson of vascular surgery. He underwent an angiogram on 05/10/16. Unfortunately nothing really could be done to improve his vascular status. He had a 75-90% stenosis in the midsegment of 1 segment of the posterior femoral artery. He had a patent popliteal, his anterior tibial occluded shortly after takeoff. Perineal had a greater than 90% stenosis posterior tibial is occluded feet had no distal collaterals feed distal aspect of the transmetatarsal amputation site. The patient tells me that he had a prolonged period of Santyl by Dr. Doran Durand was some initial improvement but then this was stopped. I think they're only applying daily dressings/dry dressings. He has not had a recent x-ray of the right foot he did have one before his surgery in April. His wife by the dimensions of the wound/surgical site being followed at home since 4/20. At that point the dimensions were 0.5 x 12 x 0.2 on 7/19 this was 1.8 x 6 x 0.4. He is not currently on any antibiotics. His hemoglobin A1c in early April was 12.9 at that point he was  started on insulin. Apparently his blood sugars are much lower he has an appointment with Dr. Legrand Como Alteimer of endocrine next week. 05/29/16 x-ray of the  area did not show osteomyelitis. I think he probably needs an MRI at this point. His wife is asking about something called"Yireh" cream which is not FDA approved. I have not heard of this. 06/22/16; MRI did not really suggest osteomyelitis. There was minimal marrow edema and enhancement in the stump of the second metatarsal felt to be secondary likely to postoperative change rather than osteomyelitis. The patient has arranged his own consultation with Dr. Andree Elk at [place], apparently their daughter lives in [place] and has some connection here. Any improvement in vascular supply by Dr. Andree Elk would of course be helpful. Dr. Bridgett Larsson did not feel that anything further could be done other than amputation if wound care did not result in healing or if the area deteriorates. 06/26/16; the patient has been to see Dr. Andree Elk at [place] and had an angiogram. He is going for a procedure on Thursday which will involve catheterization. I'm not sure if this is an anterograde or retrograde approach. He has been using Santyl to the wound 07/10/16; the patient had a repeat angiogram and angioplasty at [place] by Dr. Brunetta Jeans. His angiogram showed right CFA and profundal widely patent. The right as of a.m. popliteal artery were widely patent the right anterior tibial was occluded proximally and reconstitutes at the ankle. Peroneal artery was patent to the foot. Posterior tibial artery was occluded. The patient had angioplasty of the anterior tibial artery.. This was quite successful. He was recommended for Plavix as well as aspirin. 07/17/16; the patient was close to be a nurse visit today however the outer dressing of the Apligraf fell off. Noted drainage. I was asked to see the wound. The patient is noted an odor however his wife had noted that. Drainage with Apligraf not  necessarily a bad thing. He has not been systemically unwell 07/24/16; we are still have an issue with drainage of this wound. In spite of this I applied his second Apligraf. Medially the area still is probing to bone. 08/07/16; Apligraf reapplied in general wound looks improved. 08/21/16 Apligraf #4. Wound looks much better 09/04/16 patientt's wound again today continues to appear to improve with the application of the Apligraf's. He notes no increased discomfort or concerns at this point in time. 09/18/16; the patient returns today 2 weeks after his fifth application of Apligraf. Predictably three quarters of the width of this wound has healed. The deep area that probe to bone medially is still open. The patient asked how much out-of-pocket dollars would be for additional Apligraf's. 09/25/16; now using Hydrofera Blue. He has completed 5 Apligraf applications with considerable improvement in this deep open transmetatarsal amputation site. His wound is now a triangular-shaped wound on the medial aspect. At roughly 12 to 2:00 this probes another centimeter but as opposed to in the past this does not probe to bone. The patient has been seen at [place] by Dr. Zenia Resides. He is not planning to do any more revascularization unless the wound stalls or worsens per the patient 10/02/16; 0.7 x 0.8 x 0.8. Unfortunately although the wound looks stable to improved. There is now easily probable bone. This hasn't been present for several weeks. Patient is not otherwise symptomatic he is not experiencing any pain. I did a culture of the wound bed 10/09/16. Deterioration last week. Culture grew MRSA and although there is improvement here with doxycycline prescribed over the phone I'm going to try to get him linezolid 600 twice a day for 10 days today. 10/16/16; he is completing a weeks worth  of linezolid and still has 3 more days to go. Small triangular-shaped open area with some degree of undermining. There is still  palpable bone with a curet. Overall the area appears better than last week 10/20/16 he has completed the linezolid still having some nausea and vomiting but no diarrhea. He has exposed bone this week which is a deterioration. 10/27/16 patient now has a small but probing wound down to bone. Culture of this bone that I did last week showed a few methicillin-resistant staph aureus. I have little doubt that this represents acute/subacute osteomyelitis. The patient is currently on Doxy which I will continue he also completed 10 days of linezolid. We are now in a difficult situation with this patient's foot after considerable discussion we will send him back to see Dr. Doran Durand for a surgical opinion of this I'm also going to try to arrange a infectious disease consult at [facility] hopefully week and get this prior to her usual 4-6 weeks we having [place]. The patient clearly is going to need 6 weeks of IV vancomycin. If we cannot arrange this expediently I'll have to consider ordering this myself through a home infusion company 11/03/16; the patient now has a small in terms of circumference but probing wound. No bone palpable today. The patient remains on doxycycline 100 twice a day which should support him until he sees infectious disease at [facility] next week the following week on Wednesday I believe he has an appointment with Dr. Andree Elk at [facility] who is his vascular cardiologist. Finally he has an appointment with Dr. Doran Durand on 11/22/16 we have been using silver alginate. The patient's wife states they are having trouble getting this through [facility] 11/13/16; the patient was seen by infectious disease at [facility] in the 11th PICC line placed in preparation for IV antibiotics. A tummy he has not going to get IV vancomycin o Ceftaroline. They've also ordered an MRI. Patient has a follow-up with Dr. Doran Durand on 11/22/16 and Dr. Andree Elk at [facility] tomorrow 11/23/16 the patient is on daptomycin as directed by  infectious disease at [facility]. He is also been back to see Dr. Andree Elk at [facility]. He underwent a repeat arteriogram. He had a successful PTA of the right anterior tibial artery. He is on dual antiplatelete treatment with Plavix and aspirin. Finally he had the MRI of his foot in [place]. This showed cellulitis about the foot worse distally edema and enhancement in the reminiscent of the second metatarsal was consistent with osteomyelitis therefore what I was assuming to be the first metatarsal may be actually the second. He also has a fluid collection deep to the calcaneus at the level of the calcaneal spur which could be an abscess or due to adventitial bursitis. He had a small tear in his Achilles 11/30/16; the patient continues on daptomycin as directed by infectious disease at [facility]. He is been revascularized by Dr. Andree Elk at [place] in [place]. He has been to see Gretta Arab who was the orthopedic surgeon who did his original amputation. I have not seen his not however per the patient's wife he did not offer another surgical local surgical prodecure to remove involved bone. He verbalized his usual disbelief in not just hyperbarics but any medical therapy for this condition(osteomyelitis). I discussed this in detail with the patient today including answering the question about a BKA definitively "curing" the current condition. 12/07/16; the patient continues on daptomycin as directed by infectious disease at [facility]. This is directed at the MRSA  that we cultured from his bone debridement from 12/22. Lab work today shows a white count of 8.7 hemoglobin of 10.5 which is microcytic and hypochromic differential count shows a slightly elevated monocyte count at 1.2 eosinophilic count of 0.6. His creatinine is 1.11 sedimentation rate apparently is gone from 35-34 now 40. I explained was wife I don't think this represents a trend. His total CK is 48 12/14/16- patient is here for follow-up  evaluation of his right TMA site. He continues to receive IV daptomycin per infectious disease. His serum inflammatory markers remain elevated. He complains of intermittent pain to the medial aspect of the TMA site with intermittent erythema. He voices no complaints or concerns regarding hyperbaric therapy. Overall he and his wife are expressing a frustration and discouragement regarrding the length of time of treatment. 12/21/16; small open wound at roughly the first or second metatarsal metatarsalphalyngeal joint reminiscence of his transmetatarsal amputation site he continues to receive IV daptomycin per infectious disease at [facility]. He will finish these a week tomorrow. He has lab work which I been copied on. His white count is 10.8 hemoglobin 8.9 MCV is low at 75., MCH low at 24.5 platelet count slightly elevated at 626. Differential count shows 70% neutrophils 10% monocytes and 10% eosinophils. His comprehensive metabolic panel shows a slightly low sodium at 133 albumin low at 3.1 total CK is normal at 42 sedimentation rate is much higher at 82. He has had iron studies that show a serum iron of 15 and iron binding capacity of 238 and iron saturation of 6. This is suggestive of iron deficiency. B12 and folate were normal ferritin at 113 The patient tells me that he is not eating well and he has lost weight. He feels episodically nauseated. He is coughing and gagging on mucus which she thinks is sinusitis. He has an appointment with his primary doctor at 5:00 this afternoon in [facility] 01/02/17; the patient developed a subacute pneumonitis. He was admitted to [facility] after a CT scan showed an extensive interstitial pneumonitis [I have not yet seen this]. 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 He has since been discharged. He is not on oxygen. He feels fatigued and very short of  breath with exertion. At [facility] the wound care nurse there felt that his wound was healed. He did complete his daptomycin and has follow-up with infectious disease on Friday. X-rays I did before he went to [facility] still suggested residual osteomyelitis in the anterior aspect of the second must metatarsal head. I'm not sure I would've expected any different. There was no other findings. I actually think I did this because of erythema over the first metatarsal head reminiscent 01/11/17; the patient has eosinophilic pneumonitis. He has been reviewed by pulmonology and given clearance for hyperbarics at least that's what his wife says. I'll need to see if there is note in care everywhere. Apparently the prognosis for improvement of daptomycin induced eosinophilic granulocyte is is 3 months without steroids. In the meantime infectious disease has placed him on doxycycline until the wound is closed. He is still is lost a lot of weight and his blood sugars are running in the mid 60s to low 80s fasting and at all times during the day 01/18/17; he has had adjustments in his insulin apparently his blood sugars in the morning or over 100. He wants to restart his hyperbaric treatment we'll do this at  1:00. 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 I think there is good reason to complete his treatments in order to give him the best chance of maintaining a healed status and these DFU 3 wounds with MRSA infection in the bone 02/15/17; the patient was seen today in conjunction with HBO. He completed hyperbaric oxygen today. The open area on his transmetatarsal site has remained closed. There was an area of erythema when I saw him earlier in the week on the posterior heel although that is resolved as of today as well. He has been using a cam walker. This is a patient who came to [place] after a transmetatarsal amputation that was  necrotic and dehisced. He required revascularization percutaneously on 2 different occasions by Dr. Andree Elk of invasive cardiology at [facility]. 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 He went to [facility] infectious disease and had IV daptomycin for 5 weeks before developing eosinophilic pneumonitis and requiring an admission to hospital/ICU. He made a good recovery and is continued on doxycycline since. As mentioned his foot is closed now. He has a follow-up with Dr. Andree Elk tomorrow. He is going to [employer] on Monday for a custom-made shoe READMISSION Last visit Dr. Andree Elk [facility] 02/16/17 1. Critical limb ischemia of the RLE: s/p transmetatarsal amputation of the RLE, now completely healed. s/p right ATA percutaneous revascularization procedure x2, most recently 11/20/2016 s/p PTA of right AT 100% to less than 20% with a 2.5 x 200 balloon. He does have some residual osteomyelitis, but given the wound is closed, the orthopedist has recommended to follow. He has a fitting for a special shoe coming up next week. He has completed a course of daptomycin and doxycycline and he has been discharged by ID. He has completed a course of hyperbaric therapy. Continue Continue medical management with aspirin, Plavix and statin therapy. We will discuss ongoing Plavix therapy at follow-up in 6 months. 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 Given no symptoms, we will conservatively manage. He doesn't want to do any more invasive studies at this time, which is reasonable. The patient arrives today out of 2 concerns both on the right transmetatarsal site. 1 at the level of the reminiscent fifth metatarsal head and the other at roughly the first or second. Both of these look like dark  subcutaneous discoloration probably subdermal bleeding. He is recently obtained new adaptive footwear for the right foot. He also has a callus on the left fifth dorsal toe however he follows with podiatry for this and I don't think this is any issue. ABIs in this clinic today were 0.66 on the right and 1.06 on the left. On entrance into our clinic initially this was 0.95 and 0.92. He does not describe current claudication. They're going away on a cruise in 8 weeks and I think are trying to do the month is much as they can proactively. They follow with podiatry and have an appointment with Dr. Andree Elk in October READMISSION 06/25/18 This is a patient that we have not seen in almost a year. He is a type II diabetic with known PAD. He is followed by Dr. Andree Elk of interventional cardiology at [facility] in [place]. He is required revascularization for significant PAD. When we  first saw him he required a transmetatarsal amputation. He had underlying osteomyelitis with a nonhealing surgical wound. This eventually closed with wound care, IV antibiotics and hyperbaric oxygen. They tell me that he has a modified shoe and he is very active walking up to 4 miles a day. He is followed by Dr. Geroge Baseman of podiatry. His wife states that he underwent a removal of callus over this site on July 9. She felt there may be drainage from this site after that although she could never really determined and there was callus buildup again. On 06/01/18 there was pressure bleeding through the overlying callus. he was given a prescription for 7 days of Bactrim. Fortuitously he has an appointment with Dr. Andree Elk on 06/04/18 and he immediately underwent revascularization of the right leg although I have not had a chance to review these records in care everywhere. This was apparently done through anterior and retrograde access. On 06/06/18 he had another debridement by Dr. Bernette Mayers. Vitamin soaking with Epsom salts for 20 minutes and  applying calcium alginate. The original trans-met was on April 2017 I believe by Dr. Doran Durand. His ABI in our clinic was noncompressible today. 07/02/18; x-ray I ordered last week was negative for osteomyelitis. Swab culture was also negative. He is going to require an MRI which I have ordered today. 07/09/18; surprisingly the MRI of the foot that I ordered did not show osteomyelitis. He did suggest the possibility of cellulitis. For this reason I'll go ahead and give him a 10 day course of doxycycline. Although the previous culture of this area was negative 07/16/18; it arrives with the wound looking much the same. Roughly the same depth. He has thick subcutaneous tissue around the wound orifice but this still has roughly the same depth. Been using silver alginate. We applied Oasis #1 today 07/23/2018; still having to remove a lot of callus and thick subcutaneous tissue to actually define the wound here. Most of this seems to have closed down yet he has a comma shaped divot over the top of the area that still I think is open. We applied Oasis #2 His wife expressed concern about the tip of his left great toe. This almost looks like a small blister. She also showed it today to her podiatrist Dr. Geroge Baseman who did not think this was anything serious. I am not sure is anything serious either however I think it bears some watching. He is not in any pain however he is insensate 07/30/2018;; still on a lot of nonviable tissue over the surface of the wound however cleaning this up reveals a more substantial wound orifice but less of a probing wound depth. This is not probed to bone. There is no evidence of infection The wife is still concerned about a non-open area on the tip of his left great toe. Almost feels like a bony outgrowth. She had previously showed this to podiatry. I do not think this is a blister. A friction area would be possible although he is really not walking according to his wife. He had a  small skin tag on his right buttock but no open wound here either 08/06/2018; we applied a third Oasis last week. Unfortunately there is really no improvement. Still requiring extensive debridement to expose the wound bed from a horizontal slitlike depression. I still have not been able to get this to fill in properly. On the positive side there is now no probable bone from when he first came into the facility. I changed him  to silver alginate today after a reasonably aggressive debridement 08/13/2018; once again the patient comes in with skin and subcutaneous tissue closing over the small probing area with the underlying cavity of the wound on the right TMA site. I applied silver alginate to this last week. Prior to that we used Oasis x3 still not able to get this area granulating. He does not have a probing area of the bone which is an improvement from when I spur started working on this and an MRI did not suggest osteomyelitis. I do not see evidence of infection here but I am increasingly concerned about why I cannot get this area to granulate. Each time I debrided this is looks like this is simply a matter of getting granulation to fill in the hole and then getting epithelialization. This does not seem to happen Also I sent him back to see podiatry Dr. Earleen Newport about the what felt to be bony outgrowth on the tip of his left great toe. Apparently after heel he left the clinic last week or the next day he developed a blood blister. He did see Dr. Earleen Newport. He went on to have a debridement of the medial nail cuticle he now has an open area here as well as some denuded skin. They did an x-ray apparently does have a bony outgrowth or spur but I am not able to look at this. They are using topical antibiotics apparently there was some suggested he use Santyl. Patient's wife was anxious for my opinion of this 08/20/2018; we are able to keep the wound open this time instead of the thick subcutaneous tissue  closing over the top of it however unfortunately once again this probes to bone. I do not see any evidence of infection and previous MRI did not show osteomyelitis. I elected to go back to the Oasis to see if we can stimulate some granulation. Last saw his vascular interventional cardiologist Dr. Andree Elk at the beginning of August and he had a repeat procedure. Nevertheless I wonder how much blood flow he has down to this area With regards to the left first toe he is seeing Dr. Earleen Newport next week. They are applying Bactroban to this area. 08/27/2018 Once again he comes in with thick eschar and subcutaneous tissue over the top of the small probing hole. This does not appear to go down to bone but it still has roughly the same depth. I reapplied Oasis today Over the left great toe there appears to be more of the wound at the tip of his toe than there was last week. This has an eschar on the surface of it. Will change to Santyl. There is seeing podiatry this afternoon 09/03/2018 He comes in today with the area on the transmetatarsal's site with a fair amount of callus, nonviable tissue over the circumference but it was not closed. I removed all of this as well as some subcutaneous debris and reapplied Oasis. There is no exposed bone The area over the tip of the left great toe started off as a nodule of uncertain etiology. He has been followed with podiatry. They have been removing part of the medial nail bed. He has nonviable tissue over the wound he been using Santyl in this area 09/10/18 Unfortunately comes in with neither wound area looking improved. The transmetatarsal amputation site once Again has nonviable debris over the surface requiring debridement. Unfortunately underneath this there is nothing that looks viable and this once again goes right down to bone. There is  no purulent drainage and no erythema. Also the surgical wound from podiatry on the left first toe has an ischemic-looking eschar  over the surface of the tip of the toe I have elected not to attempt candidly debride this His wife as arranged for him to have follow-up noninvasive studies in [place] under the care of Dr. Andree Elk clinic. The question is he has known severe PAD. They had recently seen him in August. He has had revascularizations in both legs within the last 4 or 5 months. He is not complaining of pain and I cannot really get a history of claudication. He has not systemically unwell 09/20/2018 the patient has been to [facility] and been revascularized by Dr. Andree Elk earlier this week. Apparently he was able to open up the anterior tibial artery although I have not actually seen his formal report. He is going for an attempt to revascularize on the left on Monday. He is apparently working with an investigational stent for lower extremity arteries below the knee and he has talked to the patient about placing that on Monday if possible. The patient has been using silver alginate on the transmetatarsal amputation site and Santyl on the left 09/30/2018; patient had his revascularization on the left this apparently included a standard approach as well as a more distal arterial catheterization although I do not have any information on this from Dr. Andree Elk. In fact I do not even see the initial revascularization that he had on the right. I have included the arterial history from Dr. Andree Elk last note however below; ASSESSMENT/PLAN: 1. Hx of Critical limb ischemia bilateral lower extremities, PAD: -s/p transmetatarsal amputation of the RLE. -s/p right ATA percutaneous revascularization procedure x2, most recently 11/20/2016 s/p PTA of right AT 100% to less than 20% with a 2.5 x 200 balloon. 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 -03/21/2018 s/p PTA of 90% left popliteal artery to <10% with a  5x20 cutting balloon, PTA of 90% left peroneal to <20% with a 3x20 balloon, PTA of 100% left AT to <20% with a 2.5x220 balloon. 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 Will plan on the LLE soon after. Risks/benefits of procedure have been discussed and patient has elected to proceed. We have been using endoform to the right TMA amputation site wound and Santyl to the left great toe 10/07/2018; the area on the tip of his left great toe looked better we have been using Santyl here. We continue to have a very difficult probing hole on the right TMA amputation site we have been using endoform. I went on to use his fifth Oasis today 10/14/2018; the tip of the left great toe continues to look better. We have been using Santyl here the surface however is healthy and I think we can change to an alginate. The right TMA has not changed. Once again he has no superficial opening there is callus and thick subcutaneous tissue over the orifice once you remove this there is the probing area that we have been dealing with without too much change. There is no palpable bone I have been placing Oasis here and put Oasis #6 in this today after a more vigorous debridement 10/21/18; the left great toe still has necrotic surface  requiring debridement. The right TMA site hasn't changed in view of the thick callus over the wound bed. With removal of this there is still the opening however this does not appear to have the same depth. Again there is no palpable bone. Oasis was replaced 10/28/2018; patient comes in with both wounds looking worse. The area over the first toe tip is now down to bone. The area over the TMA site is deeper and down to bone clearly with a increase in overall wound area. Equally concerning on the right TMA is the complete absence of a pulse this week  which is a change. We are not even able to Doppler this. On the right he has a noncompressible ABI greater than 1.4 11/04/2018. Both wounds look somewhat worse. X-rays showed no osteomyelitis of the right foot but on the left there was underlying osteomyelitis in the left great toe distal phalanx. I been on the phone to Dr. Andree Elk surface at [facility] in [place] and they are arranging for another angiogram on the right on Monday. I have him on doxycycline for the osteomyelitis in the left great toe for now. Infectious disease may be necessary 1/17; 2-week hiatus. Patient was admitted to hospital at [facility]. My understanding is he underwent an angioplasty of the right anterior tibial artery and had stents placed in the left anterior artery and the left tibial peroneal trunk. This was done by Dr. Andree Elk of interventional radiology. There is no major change in either 1 of the wounds. They have been using Aquacel Ag. As far as they are aware no imaging studies were done of the foot which is indeed unfortunate. I had him on doxycycline for 2 weeks since we identified the osteomyelitis in the left great toe by plain x-ray. I have renewed that again today. I am still suspicious about osteomyelitis in the amputation site and would consider doing another MRI to compare with the one done in September. The idea of hyperbaric oxygen certainly comes up for discussion 1/24; no major change in either wound area. I have him on doxycycline for osteomyelitis at the tip of the left great toe. As noted he has been previously and recently revascularized by Dr. Andree Elk at [place]. He is tolerating the doxycycline well. For some reason we do not have an infectious disease consult yet. Culture of drainage from the right foot site last week was negative 1/31; MRI of the right foot did not show osteomyelitis of the right ankle and foot. Notable for a skin ulceration overlying the second metatarsal stump with generalizing soft tissue  edema of the ankle and foot consistent with cellulitis. Noted to have a partial-thickness tear of the Achilles tendon 7.5 cm proximal to the insertion Nothing really new in terms of symptoms. Patient's area on the tip of the left great toe is just about closed although he still has the probing area on the metatarsal amputation site. Appointment with Dr. Linus Salmons of infectious disease next week 2/7; Dr. Novella Olive did not feel that any further antibiotics were necessary he would follow-up in 2 months. The left great toe appears to be closed still some surface callus that I gently looked under high did not see anything open or anything that was threatening to be open. He still has the open area on the mid part of his TMA site using endoform 2/14; left great toe is closed and he is completing his doxycycline as of last Sunday. He is not on any antibiotics. Unfortunately out of the  right foot his wife noticed some subdermal hemorrhage this week. He had been walking 2 miles I had given him permission to do so this is not really a plantar wound. Using endoform to this wound but I changed to silver alginate this week 2/21; left great toe remains closed. Culture last week grew Streptococcus angiosis which I am not really familiar with however it is penicillin sensitive and I am going to put him on Augmentin. Not much change in the wound on the right foot the deep area is still probing precariously close to bone and the wound on the margin of the TMA is larger. 2/28; left great toe remains closed. He is completing the Augmentin I gave him last week. 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 This is being shown to Dr. Andree Elk at [place] to see if there is anything else that can be done here. He has been using silver alginate strips on the right 3/6; left great toe remains closed. He sees Dr. Andree Elk on Monday. The area on the plantar aspect of the right foot has the same small orifice  with thick callused tissue around this. However this time with removal of the callus tissue the wound is open all the way along the incision line to the end medially. We have been using silver alginate. 3/13; left toe remains closed although the area is callused. He sees Dr. Jacqualyn Posey of podiatry next week. The area on the plantar right foot looked a lot better this week. Culture I did of this was negative we use silver alginate. He is going next week for an attempt at revascularization by Dr. Andree Elk 3/23; left toe remains closed although the area is callused. He will see Dr. Jacqualyn Posey in follow-up. The area on the right plantar foot continues to look surprisingly better over the last 3 visits. We have been using silver alginate. The revascularization he was supposed to have by Dr. Andree Elk at [facility] in [place] has been canceled [facility] procedure] 4/6; the right foot remains closed albeit callused. Podiatry canceled the appointment with regards to the left great toe. He has not seen Dr. Andree Elk at [facility] but thinks that Dr. Andree Elk has "done all he can do". He would be a candidate for the hemostaemix trial Readmission 07/29/2019 Mr. Stann Mainland is a man we know well from at least 3 previous stays in this clinic. He is a type II diabetic with severe PAD followed by Dr. Andree Elk at [facility] in [place]. 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 So far everything in both of these areas has remained closed. About 2 weeks ago he went to see his podiatrist at [facility] Dr. Babs Bertin. He had a thick callus on the left fifth metatarsal head that was shaved. He has developed an open wound in this area. They have been offloading this in his diabetic shoes. The patient has not had any more revascularizations by Dr. Andree Elk since the last time he was here. ABI in our clinic at the  posterior tibial on the left was 1.06 10/6; no real change in the area on the plantar met head. Small wound with 2 mm of depth. He has thick skin probably from pressure around the wound. We have been using silver alginate 10/13; small wound in the left fifth plantar met head.  Arrives today with undermining laterally and purulent drainage. Our intake nurse cultured this. His wife stated they noticed a change in color over the last day or 2. He is not systemically unwell 10/19; small wound on the fifth plantar metatarsal head. Culture I did last week showed Staphylococcus lugdunensis. Although this could be a skin contaminant the possibility of a skin and soft tissue infection was there. I did give him empiric doxycycline which should have covered this. We are using silver alginate to the wound. We put him in a total contact cast today. 10/22; small wound on the fifth plantar metatarsal head. He has completed antibiotics. He also has severe PAD which worries me about just about any wound on this man's foot 10/29; small superficial area on the fifth plantar metatarsal head. Measuring slightly smaller. He is not currently on any antibiotics. I been using a total contact cast in this man with very severe PAD but he seems to be tolerating this well 11/5; left plantar fifth metatarsal head. Silver alginate being used under a total contact cast 11/12; wound not much different than last week. Using a #15 scalpel debridement around the wound. Still silver collagen under a total contact cast. He has severe PAD and that may be playing a role in this 11/19; disappointing that the wound is not really changed that much. I think it is come down in overall surface area because originally this was on the lateral part of the fifth metatarsal head however recently it is not really changed. Most of this is filled in but it will not epithelialized there is surface debris on this which may be mostly related  to ischemia. They have an appointment with Dr. Andree Elk on 12/9 09/24/2019 on evaluation today patient appears to be doing somewhat better with regard to the wound on the left fifth metatarsal head. Fortunately there does not appear to be any signs of active infection at this time. No fevers, chills, nausea, vomiting, or diarrhea. The wound does not appear to be completely closed and I do feel like the Hydrofera Blue is helping to some degree although I feel like the cast as well as keeping things from breaking down or getting any larger at least. As far as his wound overview it shows that the overall size of the wound is slightly smaller but really maintaining within the realm of about the same. He had no troubles with the cast which is good news. 12/1; left fifth metatarsal head. Perhaps somewhat more vibrant. We have been using Hydrofera Blue. He sees Dr. Andree Elk at [place] 1 week tomorrow. He be back next week and I hope to have a better idea which way the wound is going however I will not cast him next week in case Dr. Andree Elk wants to reevaluate things in his foot. The left leg is the leg with the stent in place and I wonder whether these are going to have to be reevaluated. 12/8; left fifth metatarsal head. Quite a bit better this week. Only a small open area remains. He sees Dr. Andree Elk at [place] tomorrow. Dr. Andree Elk is previously done his vascular interventions. He has 2 stents in the left leg as I remember things. I therefore will not put him in a total contact cast today. He will be using a forefoot off loader. We will use silver collagen on the wound. We will see him again next week 12/15; left fifth metatarsal head. He saw Dr. Andree Elk and is scheduled for an angiogram on  Thursday. Unfortunately although his wound was a lot better last week it is really deteriorated this week. Small punched-out hole with depth and overhanging tissue. We have been using Hydrofera Blue 12/22; patient had an 80% stenosis  proximal to the stent I believe in the below-knee popliteal artery. This was opened by Dr. Andree Elk. According the patient the stent itself was satisfactory. I have not been able to review this note. 12/29; patient arrives today with the wound about the same size some undermining medially. We have been using Hydrofera Blue under a total contact cast and that is what we will do again today 11/04/2019. Slightly smaller in orifice but about 4 mm undermining almost circumferentially. We have been using Hydrofera Blue. Apparently the Hydrofera Blue did not stay on the wound after we removed the cast. Some minor looking cast irritation on the right lateral 1/12; unfortunately things did not go well this week. Arrives in clinic today with a deeper wound with undermining more concerning a area of pus. Specimen was obtained for culture. We are not going to be able to put him in a cast today. 1/19; purulent material last week which I cultured showed Enterobacter cloacae. He was on ampicillin previously prescribed by his primary doctor which should not have covered this however mysteriously the wound looks somewhat better. There is less depth less erythema certainly no drainage. He will need a course of ciprofloxacin which I will provide today. 1/26; he has completed the ciprofloxacin. I don't think he needs any additional antibiotics. The areas on the left fifth plantar met head. We've been using silver alginate 2/2; comes in today with 0.4 cm of circumferential undermining around a small wound in this area. We have been using silver alginate with a forefoot offloading boot 2/9; again the wound appears larger nonviable surface and with 0.4 cm roughly of circumferential undermining. We have been using silver alginate with a forefoot offloading boot. The wound does not look infected however his wife is quick to point out about swelling on the dorsal foot. 2/16; not much change. Comes in with a small wound with  a nonviable necrotic surface. Again marked undermining that I had totally removed last week. He had a arterial Doppler last week in Dr. Andree Elk office at [facility]. They have not heard these results. They are somewhat frustrated. 2/23; if anything this is worse. Undermining increased depth. Nonviable surface that looks pale. According his wife is TBI on the right was 0.22 although I have not verified this. He is going for an angiogram with Dr. Andree Elk next Monday. We are using polymen and offloading in a forefoot offloading boot. 3/2; the patient was revascularized yesterday by [facility]. He had successful PTA of the left peroneal 60% to less than 20% and successful PTA of the left ATA 100% to less than 20%. He now has a pulse in the left foot Now that he has some additional blood flow there is not a lot of options here. On one hand we want activity to help keep blood flow augmented on the other hand pressure relief is going to be paramount if we have any chance to get this to close. After some discussion with his wife and patient I went ahead and put him in a total contact cast. 3/9; he arrives in clinic today with some debris over the wound surface. We use silver collagen under a total contact cast after his revascularization by Dr. Andree Elk 3/16; wound about the same depth at  0.5 cm. Surface area perhaps slightly less but it is the depth the really is important. We have been using silver collagen Electronic Signature(s) Signed: 01/13/2020 5:29:05 PM By: [name] MD Entered By: [name] on 01/13/2020 13:54:04 -------------------------------------------------------------------------------- Physical Exam Details Patient Name: Date of Service: [name], [name] 01/13/2020 12:30 PM Medical Record [id number] Patient Account Number: [id number] Date of Birth/Sex: Treating RN: April 17, 1946 (74 y.o. M) Primary Care Provider: [name] Other Clinician: Referring  Provider: Treating Provider/Extender:Tyree Vandruff, [name], [name] in Treatment: 24 Constitutional Sitting or standing Blood Pressure is within target range for patient.. Pulse regular and within target range for patient.Marland Kitchen Respirations regular, non-labored and within target range.. Temperature is normal and within the target range for the patient.. Eyes No scleral icterus. Pupils equal, symmetric, and react to light.. Cardiovascular Pedal pulses faintly palpable on the left but definitely an improvement post his last procedure.. Integumentary (Hair, Skin) There is no erythema around the wound. Notes Wound exam; surface area slightly better although the depth of the wound is the same. Using a #3 curette I gently removed adherent slough and necrotic debris from the wound surface. Minimal amount of bleeding noted. Electronic Signature(s) Signed: 01/13/2020 5:29:05 PM By: [name] MD Entered By: [name] on 01/13/2020 13:55:28 -------------------------------------------------------------------------------- Physician Orders Details Patient Name: Date of Service: [name], [name] 01/13/2020 12:30 PM Medical Record [id number] Patient Account Number: [id number] Date of Birth/Sex: Treating RN: September 25, 1946 (73 y.o. [name]) [name] Primary Care Provider: [name] Other Clinician: Referring Provider: Treating Provider/Extender:Luci Bellucci, [name], [name] in Treatment: 24 Verbal / Phone Orders: No Diagnosis Coding ICD-10 Coding Code Description E11.621 Type 2 diabetes mellitus with foot ulcer E11.51 Type 2 diabetes mellitus with diabetic peripheral angiopathy without gangrene L97.521 Non-pressure chronic ulcer of other part of left foot limited to breakdown of skin L03.116 Cellulitis of left lower limb Follow-up Appointments Return Appointment in 1 week. Dressing Change Frequency Wound #5 Left Metatarsal head fifth Do not change entire dressing for one  week. Wound Cleansing Wound #5 Left Metatarsal head fifth May shower with protection. Primary Wound Dressing Wound #5 Left Metatarsal head fifth Iodoflex Other: - pad right great toe for protection Secondary Dressing Dry Gauze Off-Loading Total Contact Cast to Left Lower Extremity Electronic Signature(s) Signed: 01/13/2020 5:29:05 PM By: [name] MD Signed: 01/14/2020 8:57:53 AM By: [name] RN Entered By: [name] on 01/13/2020 13:10:18 -------------------------------------------------------------------------------- Problem List Details Patient Name: Date of Service: [name], [name] 01/13/2020 12:30 PM Medical Record [id number] Patient Account Number: [id number] Date of Birth/Sex: Treating RN: 10-06-1946 (73 y.o. Staci Acosta, Morey Hummingbird Primary Care Provider: [name] Other Clinician: Referring Provider: Treating Provider/Extender:Rudolfo Brandow, [name], [name] in Treatment: 24 Active Problems ICD-10 Evaluated Encounter Code Description Active Date Today Diagnosis E11.621 Type 2 diabetes mellitus with foot ulcer 07/29/2019 No Yes E11.51 Type 2 diabetes mellitus with diabetic peripheral 07/29/2019 No Yes angiopathy without gangrene L97.521 Non-pressure chronic ulcer of other part of left foot 07/29/2019 No Yes limited to breakdown of skin L03.116 Cellulitis of left lower limb 08/12/2019 No Yes Inactive Problems Resolved Problems Electronic Signature(s) Signed: 01/13/2020 5:29:05 PM By: [name] MD Entered By: [name] on 01/13/2020 13:52:53 -------------------------------------------------------------------------------- Progress Note Details Patient Name: Date of Service: [name], [name] 01/13/2020 12:30 PM Medical Record [id number] Patient Account Number: [id number] Date of Birth/Sex: Treating RN: 09/26/1946 (74 y.o. M) Primary Care Provider: [name] Other Clinician: Referring Provider: Treating Provider/Extender:Violanda Bobeck,  [name], [name] in Treatment: 24 Subjective History of Present Illness (HPI) 05/18/16;  this is a 74year-old diabetic who is a type II diabetic on insulin. The history is that he traumatized his right foot developed a sore sometime in late March. Shortly thereafter he went on a cruise but he had to get off the cruise ship in [place] and fly urgently back to [place] where he was admitted to [facility] and ultimately underwent a transmetatarsal amputation by Dr. 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 According to the patient and his wife this wound never really healed. He was seen on 2 occasions in the wound care center in [place] and had vascular studies and then was referred urgently to Dr. Bridgett Larsson of vascular surgery. He underwent an angiogram on 05/10/16. Unfortunately nothing really could be done to improve his vascular status. He had a 75-90% stenosis in the midsegment of 1 segment of the posterior femoral artery. He had a patent popliteal, his anterior tibial occluded shortly after takeoff. Perineal had a greater than 90% stenosis posterior tibial is occluded feet had no distal collaterals feed distal aspect of the transmetatarsal amputation site. The patient tells me that he had a prolonged period of Santyl by Dr. Doran Durand was some initial improvement but then this was stopped. I think they're only applying daily dressings/dry dressings. He has not had a recent x-ray of the right foot he did have one before his surgery in April. His wife by the dimensions of the wound/surgical site being followed at home since 4/20. At that point the dimensions were 0.5 x 12 x 0.2 on 7/19 this was 1.8 x 6 x 0.4. He is not currently on any antibiotics. His hemoglobin A1c in early April was 12.9 at that point he was started on insulin. Apparently his blood sugars are much lower he has an appointment with Dr. Legrand Como Alteimer of endocrine next week. 05/29/16 x-ray of the area did not show  osteomyelitis. I think he probably needs an MRI at this point. His wife is asking about something called"Yireh" cream which is not FDA approved. I have not heard of this. 06/22/16; MRI did not really suggest osteomyelitis. There was minimal marrow edema and enhancement in the stump of the second metatarsal felt to be secondary likely to postoperative change rather than osteomyelitis. The patient has arranged his own consultation with Dr. Andree Elk at [facility], apparently their daughter lives in [place] and has some connection here. Any improvement in vascular supply by Dr. Andree Elk would of course be helpful. Dr. Bridgett Larsson did not feel that anything further could be done other than amputation if wound care did not result in healing or if the area deteriorates. 06/26/16; the patient has been to see Dr. Andree Elk at [facility] and had an angiogram. He is going for a procedure on Thursday which will involve catheterization. I'm not sure if this is an anterograde or retrograde approach. He has been using Santyl to the wound 07/10/16; the patient had a repeat angiogram and angioplasty at [place] by Dr. Brunetta Jeans. His angiogram showed right CFA and profundal widely patent. The right as of a.m. popliteal artery were widely patent the right anterior tibial was occluded proximally and reconstitutes at the ankle. Peroneal artery was patent to the foot. Posterior tibial artery was occluded. The patient had angioplasty of the anterior tibial artery.. This was quite successful. He was recommended for Plavix as well as aspirin. 07/17/16; the patient was close to be a nurse visit today however the outer dressing of the Apligraf fell off. Noted drainage. I was asked to  see the wound. The patient is noted an odor however his wife had noted that. Drainage with Apligraf not necessarily a bad thing. He has not been systemically unwell 07/24/16; we are still have an issue with drainage of this wound. In spite of this I applied his second Apligraf.  Medially the area still is probing to bone. 08/07/16; Apligraf reapplied in general wound looks improved. 08/21/16 Apligraf #4. Wound looks much better 09/04/16 patientt's wound again today continues to appear to improve with the application of the Apligraf's. He notes no increased discomfort or concerns at this point in time. 09/18/16; the patient returns today 2 weeks after his fifth application of Apligraf. Predictably three quarters of the width of this wound has healed. The deep area that probe to bone medially is still open. The patient asked how much out-of-pocket dollars would be for additional Apligraf's. 09/25/16; now using Hydrofera Blue. He has completed 5 Apligraf applications with considerable improvement in this deep open transmetatarsal amputation site. His wound is now a triangular-shaped wound on the medial aspect. At roughly 12 to 2:00 this probes another centimeter but as opposed to in the past this does not probe to bone. The patient has been seen at [place] by Dr. Zenia Resides. He is not planning to do any more revascularization unless the wound stalls or worsens per the patient 10/02/16; 0.7 x 0.8 x 0.8. Unfortunately although the wound looks stable to improved. There is now easily probable bone. This hasn't been present for several weeks. Patient is not otherwise symptomatic he is not experiencing any pain. I did a culture of the wound bed 10/09/16. Deterioration last week. Culture grew MRSA and although there is improvement here with doxycycline prescribed over the phone I'm going to try to get him linezolid 600 twice a day for 10 days today. 10/16/16; he is completing a weeks worth of linezolid and still has 3 more days to go. Small triangular-shaped open area with some degree of undermining. There is still palpable bone with a curet. Overall the area appears better than last week 10/20/16 he has completed the linezolid still having some nausea and vomiting but no diarrhea. He has  exposed bone this week which is a deterioration. 10/27/16 patient now has a small but probing wound down to bone. Culture of this bone that I did last week showed a few methicillin-resistant staph aureus. I have little doubt that this represents acute/subacute osteomyelitis. The patient is currently on Doxy which I will continue he also completed 10 days of linezolid. We are now in a difficult situation with this patient's foot after considerable discussion we will send him back to see Dr. Doran Durand for a surgical opinion of this I'm also going to try to arrange a infectious disease consult at [facility] hopefully week and get this prior to her usual 4-6 weeks we having [place]. The patient clearly is going to need 6 weeks of IV vancomycin. If we cannot arrange this expediently I'll have to consider ordering this myself through a home infusion company 11/03/16; the patient now has a small in terms of circumference but probing wound. No bone palpable today. The patient remains on doxycycline 100 twice a day which should support him until he sees infectious disease at [facility] next week the following week on Wednesday I believe he has an appointment with Dr. Andree Elk at [facility] who is his vascular cardiologist. Finally he has an appointment with Dr. Doran Durand on 11/22/16 we have been using silver alginate. The patient's wife  states they are having trouble getting this through [facility] 11/13/16; the patient was seen by infectious disease at [facility] in the 11th PICC line placed in preparation for IV antibiotics. A tummy he has not going to get IV vancomycin o Ceftaroline. They've also ordered an MRI. Patient has a follow-up with Dr. Doran Durand on 11/22/16 and Dr. Andree Elk at [facility] tomorrow 11/23/16 the patient is on daptomycin as directed by infectious disease at [facility]. He is also been back to see Dr. Andree Elk at [facility]. He underwent a repeat arteriogram. He had a successful PTA of the right anterior  tibial artery. He is on dual antiplatelete treatment with Plavix and aspirin. Finally he had the MRI of his foot in [place]. This showed cellulitis about the foot worse distally edema and enhancement in the reminiscent of the second metatarsal was consistent with osteomyelitis therefore what I was assuming to be the first metatarsal may be actually the second. He also has a fluid collection deep to the calcaneus at the level of the calcaneal spur which could be an abscess or due to adventitial bursitis. He had a small tear in his Achilles 11/30/16; the patient continues on daptomycin as directed by infectious disease at [facility]. He is been revascularized by Dr. Andree Elk at [facility] in [place]. He has been to see Gretta Arab who was the orthopedic surgeon who did his original amputation. I have not seen his not however per the patient's wife he did not offer another surgical local surgical prodecure to remove involved bone. He verbalized his usual disbelief in not just hyperbarics but any medical therapy for this condition(osteomyelitis). I discussed this in detail with the patient today including answering the question about a BKA definitively "curing" the current condition. 12/07/16; the patient continues on daptomycin as directed by infectious disease at [facility]. This is directed at the MRSA that we cultured from his bone debridement from 12/22. Lab work today shows a white count of 8.7 hemoglobin of 10.5 which is microcytic and hypochromic differential count shows a slightly elevated monocyte count at 1.2 eosinophilic count of 0.6. His creatinine is 1.11 sedimentation rate apparently is gone from 35-34 now 40. I explained was wife I don't think this represents a trend. His total CK is 48 12/14/16- patient is here for follow-up evaluation of his right TMA site. He continues to receive IV daptomycin per infectious disease. His serum inflammatory markers remain elevated. He complains of intermittent  pain to the medial aspect of the TMA site with intermittent erythema. He voices no complaints or concerns regarding hyperbaric therapy. Overall he and his wife are expressing a frustration and discouragement regarrding the length of time of treatment. 12/21/16; small open wound at roughly the first or second metatarsal metatarsalphalyngeal joint reminiscence of his transmetatarsal amputation site he continues to receive IV daptomycin per infectious disease at [facility]. He will finish these a week tomorrow. He has lab work which I been copied on. His white count is 10.8 hemoglobin 8.9 MCV is low at 75., MCH low at 24.5 platelet count slightly elevated at 626. Differential count shows 70% neutrophils 10% monocytes and 10% eosinophils. His comprehensive metabolic panel shows a slightly low sodium at 133 albumin low at 3.1 total CK is normal at 42 sedimentation rate is much higher at 82. He has had iron studies that show a serum iron of 15 and iron binding capacity of 238 and iron saturation of 6. This is suggestive of iron deficiency. B12 and folate were normal ferritin  at 31 The patient tells me that he is not eating well and he has lost weight. He feels episodically nauseated. He is coughing and gagging on mucus which she thinks is sinusitis. He has an appointment with his primary doctor at 5:00 this afternoon in [facility] 01/02/17; the patient developed a subacute pneumonitis. He was admitted to [facility] after a CT scan showed an extensive interstitial pneumonitis [I have not yet seen this]. 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 He has since been discharged. He is not on oxygen. He feels fatigued and very short of breath with exertion. At [facility] the wound care nurse there felt that his wound was healed. He did complete his daptomycin and has follow-up with infectious disease on  Friday. X-rays I did before he went to [facility] still suggested residual osteomyelitis in the anterior aspect of the second must metatarsal head. I'm not sure I would've expected any different. There was no other findings. I actually think I did this because of erythema over the first metatarsal head reminiscent 01/11/17; the patient has eosinophilic pneumonitis. He has been reviewed by pulmonology and given clearance for hyperbarics at least that's what his wife says. I'll need to see if there is note in care everywhere. Apparently the prognosis for improvement of daptomycin induced eosinophilic granulocyte is is 3 months without steroids. In the meantime infectious disease has placed him on doxycycline until the wound is closed. He is still is lost a lot of weight and his blood sugars are running in the mid 60s to low 80s fasting and at all times during the day 01/18/17; he has had adjustments in his insulin apparently his blood sugars in the morning or over 100. He wants to restart his hyperbaric treatment we'll do this at 1:00. 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 I think there is good reason to complete his treatments in order to give him the best chance of maintaining a healed status and these DFU 3 wounds with MRSA infection in the bone 02/15/17; the patient was seen today in conjunction with HBO. He completed hyperbaric oxygen today. The open area on his transmetatarsal site has remained closed. There was an area of erythema when I saw him earlier in the week on the posterior heel although that is resolved as of today as well. He has been using a cam walker. This is a patient who came to [place] after a transmetatarsal amputation that was necrotic and dehisced. He required revascularization percutaneously on 2 different occasions by Dr. Andree Elk of invasive cardiology at [facility]. 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 He went to [facility] infectious disease and had IV daptomycin for 5 weeks before developing eosinophilic pneumonitis and requiring an admission to hospital/ICU. He made a good recovery and is continued on doxycycline since. As mentioned his foot is closed now. He has a follow-up with Dr. Andree Elk tomorrow. He is going to [employer] on Monday for a custom-made shoe READMISSION Last visit Dr. Andree Elk [facility] 02/16/17 1. Critical limb ischemia of the RLE:  s/p transmetatarsal amputation of the RLE, now completely healed.  s/p right ATA percutaneous revascularization procedure x2, most recently 11/20/2016 s/p PTA of right AT 100% to less than 20% with a 2.5 x 200 balloon.  He does have some residual osteomyelitis, but given the wound is closed, the orthopedist has recommended to follow. He has a fitting for a special shoe coming up next week. He has completed a course of daptomycin and doxycycline and he has been discharged by ID. He has completed a course of hyperbaric therapy.  Continue Continue medical management with aspirin, Plavix and statin therapy. We will discuss ongoing Plavix therapy at follow-up in 6 months.  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 Given no symptoms, we will conservatively manage. He doesn't want to do any more invasive studies at this time, which is reasonable. The patient arrives today out of 2 concerns both on the right transmetatarsal site. 1 at the level of the reminiscent fifth metatarsal head and the other at roughly the first or second. Both of these look like dark subcutaneous discoloration probably subdermal bleeding. He is recently obtained new adaptive footwear for the right foot. He also has a callus on the left  fifth dorsal toe however he follows with podiatry for this and I don't think this is any issue. ABIs in this clinic today were 0.66 on the right and 1.06 on the left. On entrance into our clinic initially this was 0.95 and 0.92. He does not describe current claudication. They're going away on a cruise in 8 weeks and I think are trying to do the month is much as they can proactively. They follow with podiatry and have an appointment with Dr. Andree Elk in October READMISSION 06/25/18 This is a patient that we have not seen in almost a year. He is a type II diabetic with known PAD. He is followed by Dr. Andree Elk of interventional cardiology at [facility] in [place]. He is required revascularization for significant PAD. When we first saw him he required a transmetatarsal amputation. He had underlying osteomyelitis with a nonhealing surgical wound. This eventually closed with wound care, IV antibiotics and hyperbaric oxygen. They tell me that he has a modified shoe and he is very active walking up to 4 miles a day. He is followed by Dr. Geroge Baseman of podiatry. His wife states that he underwent a removal of callus over this site on July 9. She felt there may be drainage from this site after that although she could never really determined and there was callus buildup again. On 06/01/18 there was pressure bleeding through the overlying callus. he was given a prescription for 7 days of Bactrim. Fortuitously he has an appointment with Dr. Andree Elk on 06/04/18 and he immediately underwent revascularization of the right leg although I have not had a chance to review these records in care everywhere. This was apparently done through anterior and retrograde access. On 06/06/18 he had another debridement by Dr. Bernette Mayers. Vitamin soaking with Epsom salts for 20 minutes and applying calcium alginate. The original trans-met was on April 2017 I believe by Dr. Doran Durand. His ABI in our clinic was noncompressible today. 07/02/18; x-ray I  ordered last week was negative for osteomyelitis. Swab culture was also negative. He is going to require an MRI which I have ordered today. 07/09/18; surprisingly the MRI of the foot that I ordered did not show osteomyelitis. He did suggest the possibility of cellulitis. For this reason I'll go ahead and give him a 10 day course of doxycycline. Although the previous culture of this area was negative 07/16/18; it arrives with  the wound looking much the same. Roughly the same depth. He has thick subcutaneous tissue around the wound orifice but this still has roughly the same depth. Been using silver alginate. We applied Oasis #1 today 07/23/2018; still having to remove a lot of callus and thick subcutaneous tissue to actually define the wound here. Most of this seems to have closed down yet he has a comma shaped divot over the top of the area that still I think is open. We applied Oasis #2 His wife expressed concern about the tip of his left great toe. This almost looks like a small blister. She also showed it today to her podiatrist Dr. Geroge Baseman who did not think this was anything serious. I am not sure is anything serious either however I think it bears some watching. He is not in any pain however he is insensate 07/30/2018;; still on a lot of nonviable tissue over the surface of the wound however cleaning this up reveals a more substantial wound orifice but less of a probing wound depth. This is not probed to bone. There is no evidence of infection The wife is still concerned about a non-open area on the tip of his left great toe. Almost feels like a bony outgrowth. She had previously showed this to podiatry. I do not think this is a blister. A friction area would be possible although he is really not walking according to his wife. He had a small skin tag on his right buttock but no open wound here either 08/06/2018; we applied a third Oasis last week. Unfortunately there is really no improvement.  Still requiring extensive debridement to expose the wound bed from a horizontal slitlike depression. I still have not been able to get this to fill in properly. On the positive side there is now no probable bone from when he first came into the facility. I changed him to silver alginate today after a reasonably aggressive debridement 08/13/2018; once again the patient comes in with skin and subcutaneous tissue closing over the small probing area with the underlying cavity of the wound on the right TMA site. I applied silver alginate to this last week. Prior to that we used Oasis x3 still not able to get this area granulating. He does not have a probing area of the bone which is an improvement from when I spur started working on this and an MRI did not suggest osteomyelitis. I do not see evidence of infection here but I am increasingly concerned about why I cannot get this area to granulate. Each time I debrided this is looks like this is simply a matter of getting granulation to fill in the hole and then getting epithelialization. This does not seem to happen Also I sent him back to see podiatry Dr. Earleen Newport about the what felt to be bony outgrowth on the tip of his left great toe. Apparently after heel he left the clinic last week or the next day he developed a blood blister. He did see Dr. Earleen Newport. He went on to have a debridement of the medial nail cuticle he now has an open area here as well as some denuded skin. They did an x-ray apparently does have a bony outgrowth or spur but I am not able to look at this. They are using topical antibiotics apparently there was some suggested he use Santyl. Patient's wife was anxious for my opinion of this 08/20/2018; we are able to keep the wound open this time instead of the thick  subcutaneous tissue closing over the top of it however unfortunately once again this probes to bone. I do not see any evidence of infection and previous MRI did not show  osteomyelitis. I elected to go back to the Oasis to see if we can stimulate some granulation. Last saw his vascular interventional cardiologist Dr. Andree Elk at the beginning of August and he had a repeat procedure. Nevertheless I wonder how much blood flow he has down to this area With regards to the left first toe he is seeing Dr. Earleen Newport next week. They are applying Bactroban to this area. 08/27/2018 ooOnce again he comes in with thick eschar and subcutaneous tissue over the top of the small probing hole. This does not appear to go down to bone but it still has roughly the same depth. I reapplied Oasis today ooOver the left great toe there appears to be more of the wound at the tip of his toe than there was last week. This has an eschar on the surface of it. Will change to Santyl. There is seeing podiatry this afternoon 09/03/2018 ooHe comes in today with the area on the transmetatarsal's site with a fair amount of callus, nonviable tissue over the circumference but it was not closed. I removed all of this as well as some subcutaneous debris and reapplied Oasis. There is no exposed bone ooThe area over the tip of the left great toe started off as a nodule of uncertain etiology. He has been followed with podiatry. They have been removing part of the medial nail bed. He has nonviable tissue over the wound he been using Santyl in this area 09/10/18 ooUnfortunately comes in with neither wound area looking improved. The transmetatarsal amputation site once Again has nonviable debris over the surface requiring debridement. Unfortunately underneath this there is nothing that looks viable and this once again goes right down to bone. There is no purulent drainage and no erythema. ooAlso the surgical wound from podiatry on the left first toe has an ischemic-looking eschar over the surface of the tip of the toe I have elected not to attempt candidly debride this ooHis wife as arranged for him to have  follow-up noninvasive studies in [place] under the care of Dr. Andree Elk clinic. The question is he has known severe PAD. They had recently seen him in August. He has had revascularizations in both legs within the last 4 or 5 months. He is not complaining of pain and I cannot really get a history of claudication. He has not systemically unwell 09/20/2018 the patient has been to [facility] and been revascularized by Dr. Andree Elk earlier this week. Apparently he was able to open up the anterior tibial artery although I have not actually seen his formal report. He is going for an attempt to revascularize on the left on Monday. He is apparently working with an investigational stent for lower extremity arteries below the knee and he has talked to the patient about placing that on Monday if possible. The patient has been using silver alginate on the transmetatarsal amputation site and Santyl on the left 09/30/2018; patient had his revascularization on the left this apparently included a standard approach as well as a more distal arterial catheterization although I do not have any information on this from Dr. Andree Elk. In fact I do not even see the initial revascularization that he had on the right. I have included the arterial history from Dr. Andree Elk last note however below; ASSESSMENT/PLAN: 1. Hx of Critical limb ischemia bilateral  lower extremities, PAD: -s/p transmetatarsal amputation of the RLE. -s/p right ATA percutaneous revascularization procedure x2, most recently 11/20/2016 s/p PTA of right AT 100% to less than 20% with a 2.5 x 200 balloon. 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 -03/21/2018 s/p PTA of 90% left popliteal artery to <10% with a 5x20 cutting balloon, PTA of 90% left peroneal to <20% with a 3x20 balloon, PTA of 100% left AT to <20% with a 2.5x220  balloon. 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 Will plan on the LLE soon after. Risks/benefits of procedure have been discussed and patient has elected to proceed. We have been using endoform to the right TMA amputation site wound and Santyl to the left great toe 10/07/2018; the area on the tip of his left great toe looked better we have been using Santyl here. We continue to have a very difficult probing hole on the right TMA amputation site we have been using endoform. I went on to use his fifth Oasis today 10/14/2018; the tip of the left great toe continues to look better. We have been using Santyl here the surface however is healthy and I think we can change to an alginate. The right TMA has not changed. Once again he has no superficial opening there is callus and thick subcutaneous tissue over the orifice once you remove this there is the probing area that we have been dealing with without too much change. There is no palpable bone I have been placing Oasis here and put Oasis #6 in this today after a more vigorous debridement 10/21/18; the left great toe still has necrotic surface requiring debridement. The right TMA site hasn't changed in view of the thick callus over the wound bed. With removal of this there is still the opening however this does not appear to have the same depth. Again there is no palpable bone. Oasis was replaced 10/28/2018; patient comes in with both wounds looking worse. The area over the first toe tip is now down to bone. The area over the TMA site is deeper and down to bone clearly with a increase in overall wound area. Equally concerning on the right TMA is the complete absence of a pulse this week which is a change. We are not even able to Doppler this. On the right he has a noncompressible ABI greater than  1.4 11/04/2018. Both wounds look somewhat worse. X-rays showed no osteomyelitis of the right foot but on the left there was underlying osteomyelitis in the left great toe distal phalanx. I been on the phone to Dr. Andree Elk surface at [facility] in [place] and they are arranging for another angiogram on the right on Monday. I have him on doxycycline for the osteomyelitis in the left great toe for now. Infectious disease may be necessary 1/17; 2-week hiatus. Patient was admitted to hospital at [facility]. My understanding is he underwent an angioplasty of the right anterior tibial artery and had stents placed in the left anterior artery and the left tibial peroneal trunk. This was done by Dr. Andree Elk of interventional radiology. There is no major change in either 1 of the wounds. They have been using Aquacel Ag. As far as they are aware no imaging studies were done  of the foot which is indeed unfortunate. I had him on doxycycline for 2 weeks since we identified the osteomyelitis in the left great toe by plain x-ray. I have renewed that again today. I am still suspicious about osteomyelitis in the amputation site and would consider doing another MRI to compare with the one done in September. The idea of hyperbaric oxygen certainly comes up for discussion 1/24; no major change in either wound area. I have him on doxycycline for osteomyelitis at the tip of the left great toe. As noted he has been previously and recently revascularized by Dr. Andree Elk at [place]. He is tolerating the doxycycline well. For some reason we do not have an infectious disease consult yet. Culture of drainage from the right foot site last week was negative 1/31; MRI of the right foot did not show osteomyelitis of the right ankle and foot. Notable for a skin ulceration overlying the second metatarsal stump with generalizing soft tissue edema of the ankle and foot consistent with cellulitis. Noted to have a partial-thickness tear of the Achilles  tendon 7.5 cm proximal to the insertion Nothing really new in terms of symptoms. Patient's area on the tip of the left great toe is just about closed although he still has the probing area on the metatarsal amputation site. Appointment with Dr. Linus Salmons of infectious disease next week 2/7; Dr. Novella Olive did not feel that any further antibiotics were necessary he would follow-up in 2 months. The left great toe appears to be closed still some surface callus that I gently looked under high did not see anything open or anything that was threatening to be open. He still has the open area on the mid part of his TMA site using endoform 2/14; left great toe is closed and he is completing his doxycycline as of last Sunday. He is not on any antibiotics. Unfortunately out of the right foot his wife noticed some subdermal hemorrhage this week. He had been walking 2 miles I had given him permission to do so this is not really a plantar wound. Using endoform to this wound but I changed to silver alginate this week 2/21; left great toe remains closed. Culture last week grew Streptococcus angiosis which I am not really familiar with however it is penicillin sensitive and I am going to put him on Augmentin. Not much change in the wound on the right foot the deep area is still probing precariously close to bone and the wound on the margin of the TMA is larger. 2/28; left great toe remains closed. He is completing the Augmentin I gave him last week. 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 This is being shown to Dr. Andree Elk at [place] to see if there is anything else that can be done here. He has been using silver alginate strips on the right 3/6; left great toe remains closed. He sees Dr. Andree Elk on Monday. The area on the plantar aspect of the right foot has the same small orifice with thick callused tissue around this. However this time with removal of the callus tissue the wound is open  all the way along the incision line to the end medially. We have been using silver alginate. 3/13; left toe remains closed although the area is callused. He sees Dr. Jacqualyn Posey of podiatry next week. The area on the plantar right foot looked a lot better this week. Culture I did of this was negative we use silver alginate. He is going  next week for an attempt at revascularization by Dr. Andree Elk 3/23; left toe remains closed although the area is callused. He will see Dr. Jacqualyn Posey in follow-up. The area on the right plantar foot continues to look surprisingly better over the last 3 visits. We have been using silver alginate. The revascularization he was supposed to have by Dr. Andree Elk at [facility] in [place] has been canceled [facility] procedure] 4/6; the right foot remains closed albeit callused. Podiatry canceled the appointment with regards to the left great toe. He has not seen Dr. Andree Elk at [facility] but thinks that Dr. Andree Elk has "done all he can do". He would be a candidate for the hemostaemix trial Readmission 07/29/2019 Mr. Stann Mainland is a man we know well from at least 3 previous stays in this clinic. He is a type II diabetic with severe PAD followed by Dr. Andree Elk at [facility] in [place]. 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 So far everything in both of these areas has remained closed. About 2 weeks ago he went to see his podiatrist at [facility] Dr. Babs Bertin. He had a thick callus on the left fifth metatarsal head that was shaved. He has developed an open wound in this area. They have been offloading this in his diabetic shoes. The patient has not had any more revascularizations by Dr. Andree Elk since the last time he was here. ABI in our clinic at the posterior tibial on the left was 1.06 10/6; no real change in the area on the plantar met head. Small wound with 2 mm  of depth. He has thick skin probably from pressure around the wound. We have been using silver alginate 10/13; small wound in the left fifth plantar met head. Arrives today with undermining laterally and purulent drainage. Our intake nurse cultured this. His wife stated they noticed a change in color over the last day or 2. He is not systemically unwell 10/19; small wound on the fifth plantar metatarsal head. Culture I did last week showed Staphylococcus lugdunensis. Although this could be a skin contaminant the possibility of a skin and soft tissue infection was there. I did give him empiric doxycycline which should have covered this. We are using silver alginate to the wound. We put him in a total contact cast today. 10/22; small wound on the fifth plantar metatarsal head. He has completed antibiotics. He also has severe PAD which worries me about just about any wound on this man's foot 10/29; small superficial area on the fifth plantar metatarsal head. Measuring slightly smaller. He is not currently on any antibiotics. I been using a total contact cast in this man with very severe PAD but he seems to be tolerating this well 11/5; left plantar fifth metatarsal head. Silver alginate being used under a total contact cast 11/12; wound not much different than last week. Using a #15 scalpel debridement around the wound. Still silver collagen under a total contact cast. He has severe PAD and that may be playing a role in this 11/19; disappointing that the wound is not really changed that much. I think it is come down in overall surface area because originally this was on the lateral part of the fifth metatarsal head however recently it is not really changed. Most of this is filled in but it will not epithelialized there is surface debris  on this which may be mostly related to ischemia. They have an appointment with Dr. Andree Elk on 12/9 09/24/2019 on evaluation today patient appears to be doing somewhat  better with regard to the wound on the left fifth metatarsal head. Fortunately there does not appear to be any signs of active infection at this time. No fevers, chills, nausea, vomiting, or diarrhea. The wound does not appear to be completely closed and I do feel like the Hydrofera Blue is helping to some degree although I feel like the cast as well as keeping things from breaking down or getting any larger at least. As far as his wound overview it shows that the overall size of the wound is slightly smaller but really maintaining within the realm of about the same. He had no troubles with the cast which is good news. 12/1; left fifth metatarsal head. Perhaps somewhat more vibrant. We have been using Hydrofera Blue. He sees Dr. Andree Elk at [facility] 1 week tomorrow. He be back next week and I hope to have a better idea which way the wound is going however I will not cast him next week in case Dr. Andree Elk wants to reevaluate things in his foot. The left leg is the leg with the stent in place and I wonder whether these are going to have to be reevaluated. 12/8; left fifth metatarsal head. Quite a bit better this week. Only a small open area remains. He sees Dr. Andree Elk at [place] tomorrow. Dr. Andree Elk is previously done his vascular interventions. He has 2 stents in the left leg as I remember things. I therefore will not put him in a total contact cast today. He will be using a forefoot off loader. We will use silver collagen on the wound. We will see him again next week 12/15; left fifth metatarsal head. He saw Dr. Andree Elk and is scheduled for an angiogram on Thursday. Unfortunately although his wound was a lot better last week it is really deteriorated this week. Small punched-out hole with depth and overhanging tissue. We have been using Hydrofera Blue 12/22; patient had an 80% stenosis proximal to the stent I believe in the below-knee popliteal artery. This was opened by Dr. Andree Elk. According the patient the stent  itself was satisfactory. I have not been able to review this note. 12/29; patient arrives today with the wound about the same size some undermining medially. We have been using Hydrofera Blue under a total contact cast and that is what we will do again today 11/04/2019. Slightly smaller in orifice but about 4 mm undermining almost circumferentially. We have been using Hydrofera Blue. Apparently the Hydrofera Blue did not stay on the wound after we removed the cast. Some minor looking cast irritation on the right lateral 1/12; unfortunately things did not go well this week. Arrives in clinic today with a deeper wound with undermining more concerning a area of pus. Specimen was obtained for culture. We are not going to be able to put him in a cast today. 1/19; purulent material last week which I cultured showed Enterobacter cloacae. He was on ampicillin previously prescribed by his primary doctor which should not have covered this however mysteriously the wound looks somewhat better. There is less depth less erythema certainly no drainage. He will need a course of ciprofloxacin which I will provide today. 1/26; he has completed the ciprofloxacin. I don't think he needs any additional antibiotics. The areas on the left fifth plantar met head. We've been using silver alginate 2/2;  comes in today with 0.4 cm of circumferential undermining around a small wound in this area. We have been using silver alginate with a forefoot offloading boot 2/9; again the wound appears larger nonviable surface and with 0.4 cm roughly of circumferential undermining. We have been using silver alginate with a forefoot offloading boot. The wound does not look infected however his wife is quick to point out about swelling on the dorsal foot. 2/16; not much change. Comes in with a small wound with a nonviable necrotic surface. Again marked undermining that I had totally removed last week. He had a arterial Doppler last week  in Dr. Andree Elk office at [facility]. They have not heard these results. They are somewhat frustrated. 2/23; if anything this is worse. Undermining increased depth. Nonviable surface that looks pale. According his wife is TBI on the right was 0.22 although I have not verified this. He is going for an angiogram with Dr. Andree Elk next Monday. We are using polymen and offloading in a forefoot offloading boot. 3/2; the patient was revascularized yesterday by [facility]. He had successful PTA of the left peroneal 60% to less than 20% and successful PTA of the left ATA 100% to less than 20%. He now has a pulse in the left foot Now that he has some additional blood flow there is not a lot of options here. On one hand we want activity to help keep blood flow augmented on the other hand pressure relief is going to be paramount if we have any chance to get this to close. After some discussion with his wife and patient I went ahead and put him in a total contact cast. 3/9; he arrives in clinic today with some debris over the wound surface. We use silver collagen under a total contact cast after his revascularization by Dr. Andree Elk 3/16; wound about the same depth at 0.5 cm. Surface area perhaps slightly less but it is the depth the really is important. We have been using silver collagen Objective Constitutional Sitting or standing Blood Pressure is within target range for patient.. Pulse regular and within target range for patient.Marland Kitchen Respirations regular, non-labored and within target range.. Temperature is normal and within the target range for the patient.. Vitals Time Taken: 12:43 PM, Height: 69 in, Source: Stated, Weight: 210 lbs, Source: Stated, BMI: 31, Temperature: 98.6 F, Pulse: 77 bpm, Respiratory Rate: 18 breaths/min, Blood Pressure: 100/60 mmHg, Capillary Blood Glucose: 118 mg/dl. General Notes: glucose per pt report this am Eyes No scleral icterus. Pupils equal, symmetric, and  react to light.. Cardiovascular Pedal pulses faintly palpable on the left but definitely an improvement post his last procedure.. General Notes: Wound exam; surface area slightly better although the depth of the wound is the same. Using a #3 curette I gently removed adherent slough and necrotic debris from the wound surface. Minimal amount of bleeding noted. Integumentary (Hair, Skin) There is no erythema around the wound. Wound #5 status is Open. Original cause of wound was Gradually Appeared. The wound is located on the Left Metatarsal head fifth. The wound measures 0.8cm length x 0.6cm width x 0.5cm depth; 0.377cm^2 area and 0.188cm^3 volume. There is Fat Layer (Subcutaneous Tissue) Exposed exposed. There is no tunneling noted, however, there is undermining starting at 12:00 and ending at 6:00 with a maximum distance of 0.4cm. There is a medium amount of serosanguineous drainage noted. The wound margin is well defined and not attached to the wound base. There is small (1-33%) pink,  pale granulation within the wound bed. There is a large (67-100%) amount of necrotic tissue within the wound bed including Adherent Slough. Assessment Active Problems ICD-10 Type 2 diabetes mellitus with foot ulcer Type 2 diabetes mellitus with diabetic peripheral angiopathy without gangrene Non-pressure chronic ulcer of other part of left foot limited to breakdown of skin Cellulitis of left lower limb Procedures Wound #5 Pre-procedure diagnosis of Wound #5 is a Diabetic Wound/Ulcer of the Lower Extremity located on the Left Metatarsal head fifth .Severity of Tissue Pre Debridement is: Fat layer exposed. There was a Excisional Skin/Subcutaneous Tissue Debridement with a total area of 0.48 sq cm performed by Ricard Dillon., MD. With the following instrument(s): Curette to remove Viable and Non-Viable tissue/material. Material removed includes Subcutaneous Tissue, Slough, Skin: Dermis, and Skin: Epidermis  after achieving pain control using Lidocaine 5% topical ointment. No specimens were taken. A time out was conducted at 13:04, prior to the start of the procedure. A Moderate amount of bleeding was controlled with Pressure. The procedure was tolerated well with a pain level of 2 throughout and a pain level of 0 following the procedure. Post Debridement Measurements: 0.8cm length x 0.6cm width x 0.5cm depth; 0.188cm^3 volume. Character of Wound/Ulcer Post Debridement is improved. Severity of Tissue Post Debridement is: Fat layer exposed. Post procedure Diagnosis Wound #5: Same as Pre-Procedure Pre-procedure diagnosis of Wound #5 is a Diabetic Wound/Ulcer of the Lower Extremity located on the Left Metatarsal head fifth . There was a Total Contact Cast Procedure by Ricard Dillon., MD. Post procedure Diagnosis Wound #5: Same as Pre-Procedure Plan Follow-up Appointments: Return Appointment in 1 week. Dressing Change Frequency: Wound #5 Left Metatarsal head fifth: Do not change entire dressing for one week. Wound Cleansing: Wound #5 Left Metatarsal head fifth: May shower with protection. Primary Wound Dressing: Wound #5 Left Metatarsal head fifth: Iodoflex Other: - pad right great toe for protection Secondary Dressing: Dry Gauze Off-Loading: Total Contact Cast to Left Lower Extremity 1. I change the primary dressing to Iodoflex for at least a week. If I can get to a better healthier looking wound surface would consider an advanced treatment product. 2. There is some undermining towards the middle of his foot I do not think that has changed that much although this is going to be difficult Electronic Signature(s) Signed: 01/13/2020 5:29:05 PM By: [name] MD Entered By: [name] on 01/13/2020 13:56:16 -------------------------------------------------------------------------------- Total Contact Cast Details Patient Name: Date of Service: [name], [name] 01/13/2020 12:30  PM Medical Record [id number] Patient Account Number: [id number] Date of Birth/Sex: 12/14/1945 (73 y.o. M) Treating RN: Primary Care Provider: [name] Other Clinician: Referring Provider: Treating Provider/Extender:Rayjon Wery, [name], [name] in Treatment: 24 Total Contact Cast Applied for Wound Assessment: Wound #5 Left Metatarsal head fifth Performed By: Physician Ricard Dillon., MD Post Procedure Diagnosis Same as Pre-procedure Electronic Signature(s) Signed: 01/13/2020 5:29:05 PM By: [name] MD Entered By: [name] on 01/13/2020 13:53:24 -------------------------------------------------------------------------------- SuperBill Details Patient Name: Date of Service: [name], [name] 01/13/2020 Medical Record [id number] Patient Account Number: [id number] Date of Birth/Sex: Treating RN: [birth year] (74 y.o. M) Primary Care Provider: [name] Other Clinician: Referring Provider: Treating Provider/Extender:Zoee Heeney, [name], [name] in Treatment: 24 Diagnosis Coding ICD-10 Codes Code Description E11.621 Type 2 diabetes mellitus with foot ulcer E11.51 Type 2 diabetes mellitus with diabetic peripheral angiopathy without gangrene L97.521 Non-pressure chronic ulcer of other part of left foot limited to breakdown of skin L03.116 Cellulitis of left lower limb Facility Procedures  CPT4 Code Description: 22482500 11042 - DEB SUBQ TISSUE 20 SQ CM/< ICD-10 Diagnosis Description L97.521 Non-pressure chronic ulcer of other part of left foot lim E11.621 Type 2 diabetes mellitus with foot ulcer Modifier: ited to breakdo Quantity: 1 wn of skin Physician Procedures CPT4 Code Description: 3704888 11042 - WC PHYS SUBQ TISS 20 SQ CM ICD-10 Diagnosis Description L97.521 Non-pressure chronic ulcer of other part of left foot lim E11.621 Type 2 diabetes mellitus with foot ulcer Modifier: ited to breakdo Quantity: 1 wn of skin Electronic  Signature(s) Signed: 01/13/2020 5:29:05 PM By: [name] MD Entered By: [name] on 01/13/2020 13:56:49

## 2020-01-20 ENCOUNTER — Other Ambulatory Visit: Payer: Self-pay

## 2020-01-20 ENCOUNTER — Encounter (HOSPITAL_BASED_OUTPATIENT_CLINIC_OR_DEPARTMENT_OTHER): Payer: Medicare Other | Admitting: Internal Medicine

## 2020-01-20 DIAGNOSIS — E11621 Type 2 diabetes mellitus with foot ulcer: Secondary | ICD-10-CM | POA: Diagnosis not present

## 2020-01-20 NOTE — Progress Notes (Signed)
JORRELL, KUSTER ([id number]) Visit Report for 01/20/2020 Arrival Information Details Patient Name: Date of Service: GORJE, IYER 01/20/2020 12:30 PM Medical Record [id number] Patient Account Number: [id number] Date of Birth/Sex: Treating RN: 08-06-1946 (74 y.o. [name], [name] Primary Care Reyes Aldaco: [name] Other Clinician: Referring Dyllan Hughett: Treating Sacred Roa/Extender:[name], [name], [name] in Treatment: 25 Visit Information History Since Last Visit Added or deleted any medications: No Patient Arrived: Ambulatory Any new allergies or adverse reactions: No Arrival Time: 12:42 Had a fall or experienced change in No Accompanied By: spouse activities of daily living that may affect Transfer Assistance: None risk of falls: Patient Identification Verified: Yes Signs or symptoms of abuse/neglect since No Secondary Verification Process Yes last visito Completed: Hospitalized since last visit: No Patient Requires Transmission-Based No Implantable device outside of the clinic No Precautions: excluding Patient Has Alerts: No cellular tissue based products placed in the center since last visit: Has Dressing in Place as Prescribed: Yes Has Footwear/Offloading in Place as Yes Prescribed: Left: Total Contact Cast Pain Present Now: No Electronic Signature(s) Signed: 01/20/2020 5:59:14 PM By: [name] RN, BSN Entered By: [name] on 01/20/2020 12:43:16 -------------------------------------------------------------------------------- Encounter Discharge Information Details Patient Name: Date of Service: DEMONE, LYLES 01/20/2020 12:30 PM Medical Record [id number] Patient Account Number: [id number] Date of Birth/Sex: Treating RN: [birth year] (74 y.o. [name] Primary Care Lorene Klimas: [name] Other Clinician: Referring Paulanthony Gleaves: Treating Maeryn Mcgath/Extender:[name], [name], [name] in Treatment: 25 Encounter  Discharge Information Items Discharge Condition: Stable Ambulatory Status: Ambulatory Discharge Destination: Home Transportation: Private Auto Accompanied By: wife Schedule Follow-up Appointment: Yes Clinical Summary of Care: Patient Declined Electronic Signature(s) Signed: 01/20/2020 5:48:19 PM By: Cherylin Mylar Entered By: Cherylin Mylar on 01/20/2020 13:58:23 -------------------------------------------------------------------------------- Lower Extremity Assessment Details Patient Name: Date of Service: KASPER, MUDRICK 01/20/2020 12:30 PM Medical Record [id number] Patient Account Number: [id number] Date of Birth/Sex: Treating RN: 01-02-1946 (73 y.o. [name] Primary Care Keyani Rigdon: [name] Other Clinician: Referring Alister Staver: Treating Mekia Dipinto/Extender:[name], [name], [name] in Treatment: 25 Edema Assessment Assessed: [Left: No] [Right: No] Edema: [Left: Ye] [Right: s] Calf Left: Right: Point of Measurement: cm From Medial Instep 39.2 cm cm Ankle Left: Right: Point of Measurement: cm From Medial Instep 29.4 cm cm Vascular Assessment Pulses: Dorsalis Pedis Palpable: [Left:Yes] Electronic Signature(s) Signed: 01/20/2020 5:59:14 PM By: [name] RN, BSN Entered By: [name] on 01/20/2020 12:58:33 -------------------------------------------------------------------------------- Multi Wound Chart Details Patient Name: Date of Service: DREDEN, RIVERE 01/20/2020 12:30 PM Medical Record [id number] Patient Account Number: [id number] Date of Birth/Sex: Treating RN: [birth year] (73 y.o. 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] Comorbid History: [5:Cataracts, Chronic sinus N/A problems/congestion, Coronary Artery Disease, Hypertension, Peripheral Arterial Disease, Type II Diabetes, Osteoarthritis, Neuropathy] Date Acquired: [5:07/14/2019] [N/A:N/A] Weeks of Treatment: [5:25] [N/A:N/A] Wound Status: [5:Open] [N/A:N/A] Measurements L x W x D 0.8x0.8x0.6 [N/A:N/A] (cm) Area (cm) : [5:0.503] [N/A:N/A] Volume (cm) : [5:0.302] [N/A:N/A] % Reduction in Area: [5:-698.40%] [N/A:N/A] % Reduction in Volume: -2223.10% [N/A:N/A] Starting Position 1 12 (o'clock): Ending Position 1 [5:12] (o'clock): Maximum Distance 1 [5:0.5] (cm): Undermining: [5:Yes] [N/A:N/A] Classification: [5:Grade 2] [N/A:N/A] Exudate Amount: [5:Medium] [N/A:N/A] Exudate Type: [5:Serous] [  N/A:N/A] Exudate Color: [5:amber] [N/A:N/A] Wound Margin: [5:Well defined, not attached N/A] Granulation Amount: [5:Small (1-33%)] [N/A:N/A] Granulation Quality: [5:Pink, Pale] [N/A:N/A] Necrotic Amount: [5:Large (67-100%)] [N/A:N/A] Exposed Structures: [5:Fat Layer (Subcutaneous Tissue) Exposed: Yes Fascia: No Tendon: No Muscle: No Joint: No Bone: No] [N/A:N/A] Epithelialization: [5:None] [N/A:N/A] Assessment Notes: [5:macerated wound edges Total Contact Cast] [N/A:N/A N/A] Treatment Notes Electronic Signature(s) Signed: 01/20/2020 5:53:09 PM By: Baltazar Najjar MD Signed: 01/20/2020 5:59:23 PM By: [name] RN Entered By: Baltazar Najjar on 01/20/2020 13:49:18 -------------------------------------------------------------------------------- Multi-Disciplinary Care Plan Details Patient Name: Date of Service: DREQUAN, IRONSIDE 01/20/2020 12:30 PM Medical Record [id number] Patient Account Number: [id number] Date  of Birth/Sex: Treating RN: February 20, 1946 (73 y.o. [name] Primary Care Mesha Schamberger: [name] Other Clinician: Referring Lindia Garms: Treating Alsha Meland/Extender:[name], [name], [name] in Treatment: 25 Active Inactive Wound/Skin Impairment Nursing Diagnoses: Knowledge deficit related to ulceration/compromised skin integrity Goals: Patient/caregiver will verbalize understanding of skin care regimen Date Initiated: 07/29/2019 Target Resolution Date: 01/30/2020 Goal Status: Active Ulcer/skin breakdown will have a volume reduction of 30% by week 4 Date Inactivated: 10/21/2019 Target12/08/2019 Resolution Date Initiated: 07/29/2019 Date: Unmet Reason: comorbities. Goal Status: Unmet blood flow Ulcer/skin breakdown will have a volume reduction of 50% by week 8 Date Initiated: 10/21/2019 Date Inactivated: 12/02/2019 Target Resolution Date: 11/21/2019 Goal Status: Unmet Unmet Reason: comorbities Ulcer/skin breakdown will have a volume reduction of 80% by week 12 Date Initiated: 12/02/2019 Date Inactivated: 01/06/2020 Target Resolution Date: 01/02/2020 Goal Status: Unmet Unmet Reason: comorbities Interventions: Assess patient/caregiver ability to obtain necessary supplies Assess patient/caregiver ability to perform ulcer/skin care regimen upon admission and as needed Assess ulceration(s) every visit Notes: Electronic Signature(s) Signed: 01/20/2020 5:59:23 PM By: [name] RN Entered By: [name] on 01/20/2020 13:09:42 -------------------------------------------------------------------------------- Pain Assessment Details Patient Name: Date of Service: AVIEL, DAVALOS 01/20/2020 12:30 PM Medical Record [id number] Patient Account Number: [id number] Date of Birth/Sex: Treating RN: 01-27-1946 (74 y.o. [name] Primary Care Desiree Fleming: [name] Other Clinician: Referring Demarea Lorey: Treating Oval Moralez/Extender:[name], [name], [name] in  Treatment: 25 Active Problems Location of Pain Severity and Description of Pain Patient Has Paino No Site Locations Rate the pain. Current Pain Level: 0 Pain Management and Medication Current Pain Management: Electronic Signature(s) Signed: 01/20/2020 5:59:14 PM By: [name] RN, BSN Entered By: [name] on 01/20/2020 12:57:17 -------------------------------------------------------------------------------- Patient/Caregiver Education Details Patient Name: Date of Service: Jeraldine Loots 3/23/2021andnbsp12:30 PM Medical Record Patient Account Number: [id number] Number: Treating RN: [name] 08-14-1946 (73 y.o. Other Clinician: Date of Birth/Gender: M) Treating Baltazar Najjar Primary Care Physician: [name] Physician/Extender: Referring Physician: Joaquin Bend in Treatment: 25 Education Assessment Education Provided To: Patient Education Topics Provided Offloading: Methods: Explain/Verbal Responses: State content correctly Electronic Signature(s) Signed: 01/20/2020 5:59:23 PM By: [name] RN Entered By: [name] on 01/20/2020 13:10:02 -------------------------------------------------------------------------------- Wound Assessment Details Patient Name: Date of Service: PANCHO, RUSHING 01/20/2020 12:30 PM Medical Record [id number] Patient Account Number: [id number] Date of Birth/Sex: Treating RN: [birth year] (74 y.o. [name] Primary Care Harlie Ragle: [name] Other Clinician: Referring Jacelyn Cuen: Treating Jayvon Mounger/Extender:[name], [name], [name] in Treatment: 25 Wound Status Wound Number: 5 Primary Diabetic Wound/Ulcer of the Lower Extremity Etiology: Wound Location: Left Metatarsal head fifth Wound Open Wounding Event: Gradually Appeared Status: Date Acquired: 07/14/2019 Comorbid Cataracts, Chronic sinus problems/congestion, Weeks Of Treatment: 25 History: Coronary Artery Disease,  Hypertension, Clustered Wound: No Peripheral Arterial Disease, Type II Diabetes, Osteoarthritis, Neuropathy Wound Measurements Length: (cm) 0.8 Width: (cm) 0.8 Depth: (cm) 0.6 Area: (cm) 0.503  Volume: (cm) 0.302 % Reduction in Area: -698.4% % Reduction in Volume: -2223.1% Epithelialization: None Tunneling: No Undermining: Yes Starting Position (o'clock): 12 Ending Position (o'clock): 12 Maximum Distance: (cm) 0.5 Wound Description Classification: Grade 2 Foul Odor Wound Margin: Well defined, not attached Slough/Fib Exudate Amount: Medium Exudate Type: Serous Exudate Color: amber Wound Bed Granulation Amount: Small (1-33%) Granulation Quality: Pink, Pale Fascia Exp Necrotic Amount: Large (67-100%) Fat Layer Necrotic Quality: Adherent Slough Tendon Exp Muscle Exp Joint Expo Bone Expos After Cleansing: No rino Yes Exposed Structure osed: No (Subcutaneous Tissue) Exposed: Yes osed: No osed: No sed: No ed: No Assessment Notes macerated wound edges Treatment Notes Wound #5 (Left Metatarsal head fifth) 1. Cleanse With Wound Cleanser Soap and water 3. Primary Dressing Applied Iodoflex 4. Secondary Dressing Foam Drawtex 5. Secured With Tape 7. Footwear/Offloading device applied Total Contact Cast Notes foam donut Electronic Signature(s) Signed: 01/20/2020 5:59:14 PM By: Baruch Gouty RN, BSN Entered By: Baruch Gouty on 01/20/2020 13:00:35 -------------------------------------------------------------------------------- Vitals Details Patient Name: Date of Service: SHAINE, NEWMARK 01/20/2020 12:30 PM Medical Record [id number] Patient Account Number: [id number] Date of Birth/Sex: Treating RN: 06-Mar-1946 (74 y.o. [name] Primary Care Damek Ende: [name] Other Clinician: Referring Parthenia Tellefsen: Treating Mayu Ronk/Extender:[name], [name], [name] in Treatment: 25 Vital Signs Time Taken: 12:44 Temperature (F): 98.1 Height  (in): 69 Pulse (bpm): 78 Source: Stated Respiratory Rate (breaths/min): 18 Weight (lbs): 210 Blood Pressure (mmHg): 114/69 Source: Stated Capillary Blood Glucose (mg/dl): 111 Body Mass Index (BMI): 31 Reference Range: 80 - 120 mg / dl Notes glucose per pt report this am Electronic Signature(s) Signed: 01/20/2020 5:59:14 PM By: Baruch Gouty RN, BSN Entered By: Baruch Gouty on 01/20/2020 12:44:23

## 2020-01-20 NOTE — Progress Notes (Signed)
[name], [name] ([id number]) Visit Report for 01/20/2020 HPI Details Patient Name: Date of Service: [name], [name] 01/20/2020 12:30 PM Medical Record [id number] Patient Account Number: [id number] Date of Birth/Sex: Treating RN: [birth year] (74 y.o. [name]) [name] Primary Care Provider: [name] Other Clinician: Referring Provider: Treating Provider/Extender:[name], [name], [name] in Treatment: 25 History of Present Illness HPI Description: 05/18/16; this is a 74year-old diabetic who is a type II diabetic on insulin. The history is that he traumatized his right foot developed a sore sometime in late March. 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 According to the patient and his wife this wound never really healed. He was seen on 2 occasions in the wound care center in [place] and had vascular studies and then was referred urgently to Dr. Bridgett Larsson of vascular surgery. He underwent an angiogram on 05/10/16. Unfortunately nothing really could be done to improve his vascular status. He had a 75-90% stenosis in the midsegment of 1 segment of the posterior femoral artery. He had a patent popliteal, his anterior tibial occluded shortly after takeoff. Perineal had a greater than 90% stenosis posterior tibial is occluded feet had no distal collaterals feed distal aspect of the transmetatarsal amputation site. The patient tells me that he had a prolonged period of Santyl by Dr. Doran Durand was some initial improvement but then this was stopped. I think they're only applying daily dressings/dry dressings. He has not had a recent x-ray of the right foot he did have one before his surgery in April. His wife by the dimensions of the wound/surgical site being followed at  home since 4/20. At that point the dimensions were 0.5 x 12 x 0.2 on 7/19 this was 1.8 x 6 x 0.4. He is not currently on any antibiotics. His hemoglobin A1c in early April was 12.9 at that point he was started on insulin. Apparently his blood sugars are much lower he has an appointment with Dr. Legrand Como Alteimer of endocrine next week. 05/29/16 x-ray of the area did not show osteomyelitis. I think he probably needs an MRI at this point. His wife is asking about something called"Yireh" cream which is not FDA approved. I have not heard of this. 06/22/16; MRI did not really suggest osteomyelitis. There was minimal marrow edema and enhancement in the stump of the second metatarsal felt to be secondary likely to postoperative change rather than osteomyelitis. The patient has arranged his own consultation with Dr. Andree Elk at [place], apparently their daughter lives in [place] and has some connection here. Any improvement in vascular supply by Dr. Andree Elk would of course be helpful. Dr. Bridgett Larsson did not feel that anything further could be done other than amputation if wound care did not result in healing or if the area deteriorates. 06/26/16; the patient has been to see Dr. Andree Elk at [facility] and had an angiogram. He is going for a procedure on Thursday which will involve catheterization. I'm not sure if this is an anterograde or retrograde approach. He has been using Santyl to the wound 07/10/16; the patient had a repeat angiogram and angioplasty at [place] by Dr. Brunetta Jeans. His angiogram showed right CFA and profundal widely patent. The right as of a.m. popliteal artery were widely patent the right anterior tibial was occluded proximally and reconstitutes at  the ankle. Peroneal artery was patent to the foot. Posterior tibial artery was occluded. The patient had angioplasty of the anterior tibial artery.. This was quite successful. He was recommended for Plavix as well as aspirin. 07/17/16; the patient was close to be a nurse  visit today however the outer dressing of the Apligraf fell off. Noted drainage. I was asked to see the wound. The patient is noted an odor however his wife had noted that. Drainage with Apligraf not necessarily a bad thing. He has not been systemically unwell 07/24/16; we are still have an issue with drainage of this wound. In spite of this I applied his second Apligraf. Medially the area still is probing to bone. 08/07/16; Apligraf reapplied in general wound looks improved. 08/21/16 Apligraf #4. Wound looks much better 09/04/16 patientt's wound again today continues to appear to improve with the application of the Apligraf's. He notes no increased discomfort or concerns at this point in time. 09/18/16; the patient returns today 2 weeks after his fifth application of Apligraf. Predictably three quarters of the width of this wound has healed. The deep area that probe to bone medially is still open. The patient asked how much out-of-pocket dollars would be for additional Apligraf's. 09/25/16; now using Hydrofera Blue. He has completed 5 Apligraf applications with considerable improvement in this deep open transmetatarsal amputation site. His wound is now a triangular-shaped wound on the medial aspect. At roughly 12 to 2:00 this probes another centimeter but as opposed to in the past this does not probe to bone. The patient has been seen at [place] by Dr. Zenia Resides. He is not planning to do any more revascularization unless the wound stalls or worsens per the patient 10/02/16; 0.7 x 0.8 x 0.8. Unfortunately although the wound looks stable to improved. There is now easily probable bone. This hasn't been present for several weeks. Patient is not otherwise symptomatic he is not experiencing any pain. I did a culture of the wound bed 10/09/16. Deterioration last week. Culture grew MRSA and although there is improvement here with doxycycline prescribed over the phone I'm going to try to get him linezolid 600 twice  a day for 10 days today. 10/16/16; he is completing a weeks worth of linezolid and still has 3 more days to go. Small triangular-shaped open area with some degree of undermining. There is still palpable bone with a curet. Overall the area appears better than last week 10/20/16 he has completed the linezolid still having some nausea and vomiting but no diarrhea. He has exposed bone this week which is a deterioration. 10/27/16 patient now has a small but probing wound down to bone. Culture of this bone that I did last week showed a few methicillin-resistant staph aureus. I have little doubt that this represents acute/subacute osteomyelitis. The patient is currently on Doxy which I will continue he also completed 10 days of linezolid. We are now in a difficult situation with this patient's foot after considerable discussion we will send him back to see Dr. Doran Durand for a surgical opinion of this I'm also going to try to arrange a infectious disease consult at [facility] hopefully week and get this prior to her usual 4-6 weeks we having [place]. The patient clearly is going to need 6 weeks of IV vancomycin. If we cannot arrange this expediently I'll have to consider ordering this myself through a home infusion company 11/03/16; the patient now has a small in terms of circumference but probing wound. No bone palpable today. The  patient remains on doxycycline 100 twice a day which should support him until he sees infectious disease at [facility] next week the following week on Wednesday I believe he has an appointment with Dr. Andree Elk at [facility] who is his vascular cardiologist. Finally he has an appointment with Dr. Doran Durand on 11/22/16 we have been using silver alginate. The patient's wife states they are having trouble getting this through [facility] 11/13/16; the patient was seen by infectious disease at [facility] in the 11th PICC line placed in preparation for IV antibiotics. A tummy he has not going to get IV  vancomycin o Ceftaroline. They've also ordered an MRI. Patient has a follow-up with Dr. Doran Durand on 11/22/16 and Dr. Andree Elk at [facility] tomorrow 11/23/16 the patient is on daptomycin as directed by infectious disease at [facility]. He is also been back to see Dr. Andree Elk at [facility]. He underwent a repeat arteriogram. He had a successful PTA of the right anterior tibial artery. He is on dual antiplatelete treatment with Plavix and aspirin. Finally he had the MRI of his foot in [place]. This showed cellulitis about the foot worse distally edema and enhancement in the reminiscent of the second metatarsal was consistent with osteomyelitis therefore what I was assuming to be the first metatarsal may be actually the second. He also has a fluid collection deep to the calcaneus at the level of the calcaneal spur which could be an abscess or due to adventitial bursitis. He had a small tear in his Achilles 11/30/16; the patient continues on daptomycin as directed by infectious disease at [facility]. He is been revascularized by Dr. Andree Elk at [facility] in [place]. He has been to see Gretta Arab who was the orthopedic surgeon who did his original amputation. I have not seen his not however per the patient's wife he did not offer another surgical local surgical prodecure to remove involved bone. He verbalized his usual disbelief in not just hyperbarics but any medical therapy for this condition(osteomyelitis). I discussed this in detail with the patient today including answering the question about a BKA definitively "curing" the current condition. 12/07/16; the patient continues on daptomycin as directed by infectious disease at [facility]. This is directed at the MRSA that we cultured from his bone debridement from 12/22. Lab work today shows a white count of 8.7 hemoglobin of 10.5 which is microcytic and hypochromic differential count shows a slightly elevated monocyte count at 1.2 eosinophilic count of 0.6. His  creatinine is 1.11 sedimentation rate apparently is gone from 35-34 now 40. I explained was wife I don't think this represents a trend. His total CK is 48 12/14/16- patient is here for follow-up evaluation of his right TMA site. He continues to receive IV daptomycin per infectious disease. His serum inflammatory markers remain elevated. He complains of intermittent pain to the medial aspect of the TMA site with intermittent erythema. He voices no complaints or concerns regarding hyperbaric therapy. Overall he and his wife are expressing a frustration and discouragement regarrding the length of time of treatment. 12/21/16; small open wound at roughly the first or second metatarsal metatarsalphalyngeal joint reminiscence of his transmetatarsal amputation site he continues to receive IV daptomycin per infectious disease at [facility]. He will finish these a week tomorrow. He has lab work which I been copied on. His white count is 10.8 hemoglobin 8.9 MCV is low at 75., MCH low at 24.5 platelet count slightly elevated at 626. Differential count shows 70% neutrophils 10% monocytes and 10% eosinophils. His  comprehensive metabolic panel shows a slightly low sodium at 133 albumin low at 3.1 total CK is normal at 42 sedimentation rate is much higher at 82. He has had iron studies that show a serum iron of 15 and iron binding capacity of 238 and iron saturation of 6. This is suggestive of iron deficiency. B12 and folate were normal ferritin at 113 The patient tells me that he is not eating well and he has lost weight. He feels episodically nauseated. He is coughing and gagging on mucus which she thinks is sinusitis. He has an appointment with his primary doctor at 5:00 this afternoon in [facility] 01/02/17; the patient developed a subacute pneumonitis. He was admitted to [facility] after a CT scan showed an extensive interstitial pneumonitis [I have not yet seen this]. 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 He has since been discharged. He is not on oxygen. He feels fatigued and very short of breath with exertion. At [facility] the wound care nurse there felt that his wound was healed. He did complete his daptomycin and has follow-up with infectious disease on Friday. X-rays I did before he went to [facility] still suggested residual osteomyelitis in the anterior aspect of the second must metatarsal head. I'm not sure I would've expected any different. There was no other findings. I actually think I did this because of erythema over the first metatarsal head reminiscent 01/11/17; the patient has eosinophilic pneumonitis. He has been reviewed by pulmonology and given clearance for hyperbarics at least that's what his wife says. I'll need to see if there is note in care everywhere. Apparently the prognosis for improvement of daptomycin induced eosinophilic granulocyte is is 3 months without steroids. In the meantime infectious disease has placed him on doxycycline until the wound is closed. He is still is lost a lot of weight and his blood sugars are running in the mid 60s to low 80s fasting and at all times during the day 01/18/17; he has had adjustments in his insulin apparently his blood sugars in the morning or over 100. He wants to restart his hyperbaric treatment we'll do this at 1:00. 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 I think there is good reason to complete his treatments in order to give him the best chance of maintaining a healed status and these DFU 3 wounds with MRSA infection in the bone 02/15/17; the patient was seen today in conjunction with HBO. He completed hyperbaric oxygen today. The open area on his transmetatarsal site has remained closed. There was an area of erythema when I saw him  earlier in the week on the posterior heel although that is resolved as of today as well. He has been using a cam walker. This is a patient who came to [place] after a transmetatarsal amputation that was necrotic and dehisced. He required revascularization percutaneously on 2 different occasions by Dr. Andree Elk of invasive cardiology at [facility]. 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 He went to [facility] infectious disease and had IV daptomycin for 5 weeks before developing eosinophilic pneumonitis and requiring an admission to hospital/ICU. He made a good recovery and is continued on doxycycline since. As mentioned his foot is closed now. He has a follow-up with Dr. Andree Elk tomorrow. He is going  to [employer] on Monday for a custom-made shoe READMISSION Last visit Dr. Andree Elk [facility] 02/16/17 1. Critical limb ischemia of the RLE: s/p transmetatarsal amputation of the RLE, now completely healed. s/p right ATA percutaneous revascularization procedure x2, most recently 11/20/2016 s/p PTA of right AT 100% to less than 20% with a 2.5 x 200 balloon. He does have some residual osteomyelitis, but given the wound is closed, the orthopedist has recommended to follow. He has a fitting for a special shoe coming up next week. He has completed a course of daptomycin and doxycycline and he has been discharged by ID. He has completed a course of hyperbaric therapy. Continue Continue medical management with aspirin, Plavix and statin therapy. We will discuss ongoing Plavix therapy at follow-up in 6 months. 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 Given no symptoms, we will conservatively manage. He doesn't want to do any more invasive studies at this time, which is reasonable. The patient  arrives today out of 2 concerns both on the right transmetatarsal site. 1 at the level of the reminiscent fifth metatarsal head and the other at roughly the first or second. Both of these look like dark subcutaneous discoloration probably subdermal bleeding. He is recently obtained new adaptive footwear for the right foot. He also has a callus on the left fifth dorsal toe however he follows with podiatry for this and I don't think this is any issue. ABIs in this clinic today were 0.66 on the right and 1.06 on the left. On entrance into our clinic initially this was 0.95 and 0.92. He does not describe current claudication. They're going away on a cruise in 8 weeks and I think are trying to do the month is much as they can proactively. They follow with podiatry and have an appointment with Dr. Andree Elk in October READMISSION 06/25/18 This is a patient that we have not seen in almost a year. He is a type II diabetic with known PAD. He is followed by Dr. Andree Elk of interventional cardiology at [facility] in [place]. He is required revascularization for significant PAD. When we first saw him he required a transmetatarsal amputation. He had underlying osteomyelitis with a nonhealing surgical wound. This eventually closed with wound care, IV antibiotics and hyperbaric oxygen. They tell me that he has a modified shoe and he is very active walking up to 4 miles a day. He is followed by Dr. Geroge Baseman of podiatry. His wife states that he underwent a removal of callus over this site on July 9. She felt there may be drainage from this site after that although she could never really determined and there was callus buildup again. On 06/01/18 there was pressure bleeding through the overlying callus. he was given a prescription for 7 days of Bactrim. Fortuitously he has an appointment with Dr. Andree Elk on 06/04/18 and he immediately underwent revascularization of the right leg although I have not had a chance to review these  records in care everywhere. This was apparently done through anterior and retrograde access. On 06/06/18 he had another debridement by Dr. Bernette Mayers. Vitamin soaking with Epsom salts for 20 minutes and applying calcium alginate. The original trans-met was on April 2017 I believe by Dr. Doran Durand. His ABI in our clinic was noncompressible today. 07/02/18; x-ray I ordered last week was negative for osteomyelitis. Swab culture was also negative. He is going to  require an MRI which I have ordered today. 07/09/18; surprisingly the MRI of the foot that I ordered did not show osteomyelitis. He did suggest the possibility of cellulitis. For this reason I'll go ahead and give him a 10 day course of doxycycline. Although the previous culture of this area was negative 07/16/18; it arrives with the wound looking much the same. Roughly the same depth. He has thick subcutaneous tissue around the wound orifice but this still has roughly the same depth. Been using silver alginate. We applied Oasis #1 today 07/23/2018; still having to remove a lot of callus and thick subcutaneous tissue to actually define the wound here. Most of this seems to have closed down yet he has a comma shaped divot over the top of the area that still I think is open. We applied Oasis #2 His wife expressed concern about the tip of his left great toe. This almost looks like a small blister. She also showed it today to her podiatrist Dr. Geroge Baseman who did not think this was anything serious. I am not sure is anything serious either however I think it bears some watching. He is not in any pain however he is insensate 07/30/2018;; still on a lot of nonviable tissue over the surface of the wound however cleaning this up reveals a more substantial wound orifice but less of a probing wound depth. This is not probed to bone. There is no evidence of infection The wife is still concerned about a non-open area on the tip of his left great toe. Almost feels like a  bony outgrowth. She had previously showed this to podiatry. I do not think this is a blister. A friction area would be possible although he is really not walking according to his wife. He had a small skin tag on his right buttock but no open wound here either 08/06/2018; we applied a third Oasis last week. Unfortunately there is really no improvement. Still requiring extensive debridement to expose the wound bed from a horizontal slitlike depression. I still have not been able to get this to fill in properly. On the positive side there is now no probable bone from when he first came into the facility. I changed him to silver alginate today after a reasonably aggressive debridement 08/13/2018; once again the patient comes in with skin and subcutaneous tissue closing over the small probing area with the underlying cavity of the wound on the right TMA site. I applied silver alginate to this last week. Prior to that we used Oasis x3 still not able to get this area granulating. He does not have a probing area of the bone which is an improvement from when I spur started working on this and an MRI did not suggest osteomyelitis. I do not see evidence of infection here but I am increasingly concerned about why I cannot get this area to granulate. Each time I debrided this is looks like this is simply a matter of getting granulation to fill in the hole and then getting epithelialization. This does not seem to happen Also I sent him back to see podiatry Dr. Earleen Newport about the what felt to be bony outgrowth on the tip of his left great toe. Apparently after heel he left the clinic last week or the next day he developed a blood blister. He did see Dr. Earleen Newport. He went on to have a debridement of the medial nail cuticle he now has an open area here as well as some denuded skin. They  did an x-ray apparently does have a bony outgrowth or spur but I am not able to look at this. They are using topical antibiotics  apparently there was some suggested he use Santyl. Patient's wife was anxious for my opinion of this 08/20/2018; we are able to keep the wound open this time instead of the thick subcutaneous tissue closing over the top of it however unfortunately once again this probes to bone. I do not see any evidence of infection and previous MRI did not show osteomyelitis. I elected to go back to the Oasis to see if we can stimulate some granulation. Last saw his vascular interventional cardiologist Dr. Andree Elk at the beginning of August and he had a repeat procedure. Nevertheless I wonder how much blood flow he has down to this area With regards to the left first toe he is seeing Dr. Earleen Newport next week. They are applying Bactroban to this area. 08/27/2018 Once again he comes in with thick eschar and subcutaneous tissue over the top of the small probing hole. This does not appear to go down to bone but it still has roughly the same depth. I reapplied Oasis today Over the left great toe there appears to be more of the wound at the tip of his toe than there was last week. This has an eschar on the surface of it. Will change to Santyl. There is seeing podiatry this afternoon 09/03/2018 He comes in today with the area on the transmetatarsal's site with a fair amount of callus, nonviable tissue over the circumference but it was not closed. I removed all of this as well as some subcutaneous debris and reapplied Oasis. There is no exposed bone The area over the tip of the left great toe started off as a nodule of uncertain etiology. He has been followed with podiatry. They have been removing part of the medial nail bed. He has nonviable tissue over the wound he been using Santyl in this area 09/10/18 Unfortunately comes in with neither wound area looking improved. The transmetatarsal amputation site once Again has nonviable debris over the surface requiring debridement. Unfortunately underneath this there is  nothing that looks viable and this once again goes right down to bone. There is no purulent drainage and no erythema. Also the surgical wound from podiatry on the left first toe has an ischemic-looking eschar over the surface of the tip of the toe I have elected not to attempt candidly debride this His wife as arranged for him to have follow-up noninvasive studies in [place] under the care of Dr. Andree Elk clinic. The question is he has known severe PAD. They had recently seen him in August. He has had revascularizations in both legs within the last 4 or 5 months. He is not complaining of pain and I cannot really get a history of claudication. He has not systemically unwell 09/20/2018 the patient has been to [facility] and been revascularized by Dr. Andree Elk earlier this week. Apparently he was able to open up the anterior tibial artery although I have not actually seen his formal report. He is going for an attempt to revascularize on the left on Monday. He is apparently working with an investigational stent for lower extremity arteries below the knee and he has talked to the patient about placing that on Monday if possible. The patient has been using silver alginate on the transmetatarsal amputation site and Santyl on the left 09/30/2018; patient had his revascularization on the left this apparently included a standard approach  as well as a more distal arterial catheterization although I do not have any information on this from Dr. Andree Elk. In fact I do not even see the initial revascularization that he had on the right. I have included the arterial history from Dr. Andree Elk last note however below; ASSESSMENT/PLAN: 1. Hx of Critical limb ischemia bilateral lower extremities, PAD: -s/p transmetatarsal amputation of the RLE. -s/p right ATA percutaneous revascularization procedure x2, most recently 11/20/2016 s/p PTA of right AT 100% to less than 20% with a 2.5 x 200 balloon. -On original angiogram  05/15/2016, he had a significant 70-95% left popliteal artery stenosis with AT and PT artery occlusions and one vessel runoff via the peroneal artery. -03/21/2018 s/p PTA of 90% left popliteal artery to <10% with a 5x20 cutting balloon, PTA of 90% left peroneal to <20% with a 3x20 balloon, PTA of 100% left AT to <20% with a 2.5x220 balloon. 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 Will plan on the LLE soon after. Risks/benefits of procedure have been discussed and patient has elected to proceed. We have been using endoform to the right TMA amputation site wound and Santyl to the left great toe 10/07/2018; the area on the tip of his left great toe looked better we have been using Santyl here. We continue to have a very difficult probing hole on the right TMA amputation site we have been using endoform. I went on to use his fifth Oasis today 10/14/2018; the tip of the left great toe continues to look better. We have been using Santyl here the surface however is healthy and I think we can change to an alginate. The right TMA has not changed. Once again he has no superficial opening there is callus and thick subcutaneous tissue over the orifice once you remove this there is the probing area that we have been dealing with without too much change. There is no palpable bone I have been placing Oasis here and put Oasis #6 in this today after a more vigorous debridement 10/21/18; the left great toe still has necrotic surface requiring debridement. The right TMA site hasn't changed in view of the thick callus over the wound bed. With removal of this there is still the opening however this does not appear to have the same depth. Again there is no palpable bone. Oasis was replaced 10/28/2018; patient comes in with both wounds looking worse. The area over the  first toe tip is now down to bone. The area over the TMA site is deeper and down to bone clearly with a increase in overall wound area. Equally concerning on the right TMA is the complete absence of a pulse this week which is a change. We are not even able to Doppler this. On the right he has a noncompressible ABI greater than 1.4 11/04/2018. Both wounds look somewhat worse. X-rays showed no osteomyelitis of the right foot but on the left there was underlying osteomyelitis in the left great toe distal phalanx. I been on the phone to Dr. Andree Elk surface at [facility] in [place] and they are arranging for another angiogram on the right on Monday. I have him on doxycycline for the osteomyelitis in the left great toe for now. Infectious disease may be necessary 1/17; 2-week hiatus. Patient was admitted to hospital at [facility]. My understanding is he underwent an angioplasty of  the right anterior tibial artery and had stents placed in the left anterior artery and the left tibial peroneal trunk. This was done by Dr. Andree Elk of interventional radiology. There is no major change in either 1 of the wounds. They have been using Aquacel Ag. As far as they are aware no imaging studies were done of the foot which is indeed unfortunate. I had him on doxycycline for 2 weeks since we identified the osteomyelitis in the left great toe by plain x-ray. I have renewed that again today. I am still suspicious about osteomyelitis in the amputation site and would consider doing another MRI to compare with the one done in September. The idea of hyperbaric oxygen certainly comes up for discussion 1/24; no major change in either wound area. I have him on doxycycline for osteomyelitis at the tip of the left great toe. As noted he has been previously and recently revascularized by Dr. Andree Elk at [place]. He is tolerating the doxycycline well. For some reason we do not have an infectious disease consult yet. Culture of drainage from  the right foot site last week was negative 1/31; MRI of the right foot did not show osteomyelitis of the right ankle and foot. Notable for a skin ulceration overlying the second metatarsal stump with generalizing soft tissue edema of the ankle and foot consistent with cellulitis. Noted to have a partial-thickness tear of the Achilles tendon 7.5 cm proximal to the insertion Nothing really new in terms of symptoms. Patient's area on the tip of the left great toe is just about closed although he still has the probing area on the metatarsal amputation site. Appointment with Dr. Linus Salmons of infectious disease next week 2/7; Dr. Novella Olive did not feel that any further antibiotics were necessary he would follow-up in 2 months. The left great toe appears to be closed still some surface callus that I gently looked under high did not see anything open or anything that was threatening to be open. He still has the open area on the mid part of his TMA site using endoform 2/14; left great toe is closed and he is completing his doxycycline as of last Sunday. He is not on any antibiotics. Unfortunately out of the right foot his wife noticed some subdermal hemorrhage this week. He had been walking 2 miles I had given him permission to do so this is not really a plantar wound. Using endoform to this wound but I changed to silver alginate this week 2/21; left great toe remains closed. Culture last week grew Streptococcus angiosis which I am not really familiar with however it is penicillin sensitive and I am going to put him on Augmentin. Not much change in the wound on the right foot the deep area is still probing precariously close to bone and the wound on the margin of the TMA is larger. 2/28; left great toe remains closed. He is completing the Augmentin I gave him last week. 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 This is being shown to Dr. Andree Elk at [place] to see if there  is anything else that can be done here. He has been using silver alginate strips on the right 3/6; left great toe remains closed. He sees Dr. Andree Elk on Monday. The area on the plantar aspect of the right foot has the same small orifice with thick callused tissue around this. However this time with removal of the callus tissue the wound is open all the way along the  incision line to the end medially. We have been using silver alginate. 3/13; left toe remains closed although the area is callused. He sees Dr. Jacqualyn Posey of podiatry next week. The area on the plantar right foot looked a lot better this week. Culture I did of this was negative we use silver alginate. He is going next week for an attempt at revascularization by Dr. Andree Elk 3/23; left toe remains closed although the area is callused. He will see Dr. Jacqualyn Posey in follow-up. The area on the right plantar foot continues to look surprisingly better over the last 3 visits. We have been using silver alginate. The revascularization he was supposed to have by Dr. Andree Elk at [facility] in [place] has been canceled [facility] procedure] 4/6; the right foot remains closed albeit callused. Podiatry canceled the appointment with regards to the left great toe. He has not seen Dr. Andree Elk at [facility] but thinks that Dr. Andree Elk has "done all he can do". He would be a candidate for the hemostaemix trial Readmission 07/29/2019 Mr. [name] is a man we know well from at least 3 previous stays in this clinic. He is a type II diabetic with severe PAD followed by Dr. Andree Elk at [facility] in [place]. 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 So far everything in both of these areas has remained closed. About 2 weeks ago he went to see his podiatrist at [facility] Dr. Babs Bertin. He had a thick callus on the left fifth metatarsal head that was  shaved. He has developed an open wound in this area. They have been offloading this in his diabetic shoes. The patient has not had any more revascularizations by Dr. Andree Elk since the last time he was here. ABI in our clinic at the posterior tibial on the left was 1.06 10/6; no real change in the area on the plantar met head. Small wound with 2 mm of depth. He has thick skin probably from pressure around the wound. We have been using silver alginate 10/13; small wound in the left fifth plantar met head. Arrives today with undermining laterally and purulent drainage. Our intake nurse cultured this. His wife stated they noticed a change in color over the last day or 2. He is not systemically unwell 10/19; small wound on the fifth plantar metatarsal head. Culture I did last week showed Staphylococcus lugdunensis. Although this could be a skin contaminant the possibility of a skin and soft tissue infection was there. I did give him empiric doxycycline which should have covered this. We are using silver alginate to the wound. We put him in a total contact cast today. 10/22; small wound on the fifth plantar metatarsal head. He has completed antibiotics. He also has severe PAD which worries me about just about any wound on this man's foot 10/29; small superficial area on the fifth plantar metatarsal head. Measuring slightly smaller. He is not currently on any antibiotics. I been using a total contact cast in this man with very severe PAD but he seems to be tolerating this well 11/5; left plantar fifth metatarsal head. Silver alginate being used under a total contact cast 11/12; wound not much different than last week. Using a #15 scalpel debridement around the wound. Still silver collagen under a total contact cast. He has severe PAD and that may be playing a role in  this 11/19; disappointing that the wound is not really changed that much. I think it is come down in overall surface area because  originally this was on the lateral part of the fifth metatarsal head however recently it is not really changed. Most of this is filled in but it will not epithelialized there is surface debris on this which may be mostly related to ischemia. They have an appointment with Dr. Andree Elk on 12/9 09/24/2019 on evaluation today patient appears to be doing somewhat better with regard to the wound on the left fifth metatarsal head. Fortunately there does not appear to be any signs of active infection at this time. No fevers, chills, nausea, vomiting, or diarrhea. The wound does not appear to be completely closed and I do feel like the Hydrofera Blue is helping to some degree although I feel like the cast as well as keeping things from breaking down or getting any larger at least. As far as his wound overview it shows that the overall size of the wound is slightly smaller but really maintaining within the realm of about the same. He had no troubles with the cast which is good news. 12/1; left fifth metatarsal head. Perhaps somewhat more vibrant. We have been using Hydrofera Blue. He sees Dr. Andree Elk at [place] 1 week tomorrow. He be back next week and I hope to have a better idea which way the wound is going however I will not cast him next week in case Dr. Andree Elk wants to reevaluate things in his foot. The left leg is the leg with the stent in place and I wonder whether these are going to have to be reevaluated. 12/8; left fifth metatarsal head. Quite a bit better this week. Only a small open area remains. He sees Dr. Andree Elk at [place] tomorrow. Dr. Andree Elk is previously done his vascular interventions. He has 2 stents in the left leg as I remember things. I therefore will not put him in a total contact cast today. He will be using a forefoot off loader. We will use silver collagen on the wound. We will see him again next week 12/15; left fifth metatarsal head. He saw Dr. Andree Elk and is scheduled for an angiogram on  Thursday. Unfortunately although his wound was a lot better last week it is really deteriorated this week. Small punched-out hole with depth and overhanging tissue. We have been using Hydrofera Blue 12/22; patient had an 80% stenosis proximal to the stent I believe in the below-knee popliteal artery. This was opened by Dr. Andree Elk. According the patient the stent itself was satisfactory. I have not been able to review this note. 12/29; patient arrives today with the wound about the same size some undermining medially. We have been using Hydrofera Blue under a total contact cast and that is what we will do again today 11/04/2019. Slightly smaller in orifice but about 4 mm undermining almost circumferentially. We have been using Hydrofera Blue. Apparently the Hydrofera Blue did not stay on the wound after we removed the cast. Some minor looking cast irritation on the right lateral 1/12; unfortunately things did not go well this week. Arrives in clinic today with a deeper wound with undermining more concerning a area of pus. Specimen was obtained for culture. We are not going to be able to put him in a cast today. 1/19; purulent material last week which I cultured showed Enterobacter cloacae. He was on ampicillin previously prescribed by his primary doctor which should not have covered this  however mysteriously the wound looks somewhat better. There is less depth less erythema certainly no drainage. He will need a course of ciprofloxacin which I will provide today. 1/26; he has completed the ciprofloxacin. I don't think he needs any additional antibiotics. The areas on the left fifth plantar met head. We've been using silver alginate 2/2; comes in today with 0.4 cm of circumferential undermining around a small wound in this area. We have been using silver alginate with a forefoot offloading boot 2/9; again the wound appears larger nonviable surface and with 0.4 cm roughly of circumferential  undermining. We have been using silver alginate with a forefoot offloading boot. The wound does not look infected however his wife is quick to point out about swelling on the dorsal foot. 2/16; not much change. Comes in with a small wound with a nonviable necrotic surface. Again marked undermining that I had totally removed last week. He had a arterial Doppler last week in Dr. Andree Elk office at [facility]. They have not heard these results. They are somewhat frustrated. 2/23; if anything this is worse. Undermining increased depth. Nonviable surface that looks pale. According his wife is TBI on the right was 0.22 although I have not verified this. He is going for an angiogram with Dr. Andree Elk next Monday. We are using polymen and offloading in a forefoot offloading boot. 3/2; the patient was revascularized yesterday by [facility]. He had successful PTA of the left peroneal 60% to less than 20% and successful PTA of the left ATA 100% to less than 20%. He now has a pulse in the left foot Now that he has some additional blood flow there is not a lot of options here. On one hand we want activity to help keep blood flow augmented on the other hand pressure relief is going to be paramount if we have any chance to get this to close. After some discussion with his wife and patient I went ahead and put him in a total contact cast. 3/9; he arrives in clinic today with some debris over the wound surface. We use silver collagen under a total contact cast after his revascularization by Dr. Andree Elk 3/16; wound about the same depth at 0.5 cm. Surface area perhaps slightly less but it is the depth the really is important. We have been using silver collagen 3/23; not much change here. There is still undermining medially the depth is about the same tissue looks somewhat better. We switched to Iodoflex last week to try to get a better looking wound surface. He does have a small abrasion over the proximal  interphalangeal joint dorsally. I this is so small it is difficult even to tell whether it is open. Some abrasion between the fifth toe and the webspace as well Electronic Signature(s) Signed: 01/20/2020 5:53:09 PM By: [name] MD Entered By: [name] on 01/20/2020 13:50:49 -------------------------------------------------------------------------------- Physical Exam Details Patient Name: Date of Service: LAVERN, MASLOW 01/20/2020 12:30 PM Medical Record [id number] Patient Account Number: [id number] Date of Birth/Sex: Treating RN: 1946/01/05 (73 y.o. [name] Primary Care Provider: [name] Other Clinician: Referring Provider: Treating Provider/Extender:Ninah Moccio, [name], [name] in Treatment: 25 Constitutional Sitting or standing Blood Pressure is within target range for patient.. Pulse regular and within target range for patient.Marland Kitchen Respirations regular, non-labored and within target range.. Temperature is normal and within the target range for the patient.Marland Kitchen Appears in no distress. Notes Wound exam; surface is better although the depth and the undermining of the  wound medially is about the same. Minor area which almost looks like an abrasion on the proximal interphalangeal joint Electronic Signature(s) Signed: 01/20/2020 5:53:09 PM By: [name] MD Entered By: [name] on 01/20/2020 13:51:59 -------------------------------------------------------------------------------- Physician Orders Details Patient Name: Date of Service: YAHSIR, WICKENS 01/20/2020 12:30 PM Medical Record [id number] Patient Account Number: [id number] Date of Birth/Sex: Treating RN: 1946-01-15 (73 y.o. [name]) [name] Primary Care Provider: [name] Other Clinician: Referring Provider: Treating Provider/Extender:[name], [name], [name] in Treatment: 25 Verbal / Phone Orders: No Diagnosis Coding ICD-10 Coding Code Description E11.621  Type 2 diabetes mellitus with foot ulcer E11.51 Type 2 diabetes mellitus with diabetic peripheral angiopathy without gangrene L97.521 Non-pressure chronic ulcer of other part of left foot limited to breakdown of skin L03.116 Cellulitis of left lower limb Follow-up Appointments Return Appointment in 1 week. Dressing Change Frequency Wound #5 Left Metatarsal head fifth Do not change entire dressing for one week. Wound Cleansing Wound #5 Left Metatarsal head fifth May shower with protection. Primary Wound Dressing Wound #5 Left Metatarsal head fifth Iodoflex Other: - pad right great toe for protection Secondary Dressing Dry Gauze Off-Loading Total Contact Cast to Left Lower Extremity Electronic Signature(s) Signed: 01/20/2020 5:53:09 PM By: [name] MD Signed: 01/20/2020 5:59:23 PM By: Todd Coria RN Entered By: [name] on 01/20/2020 13:09:31 -------------------------------------------------------------------------------- Problem List Details Patient Name: Date of Service: ZEEV, DEAKINS 01/20/2020 12:30 PM Medical Record [id number] Patient Account Number: [id number] Date of Birth/Sex: Treating RN: Apr 22, 1946 (73 y.o. [name], [name] Primary Care Provider: [name] Other Clinician: Referring Provider: Treating Provider/Extender:[name], [name], [name] in Treatment: 25 Active Problems ICD-10 Evaluated Encounter Code Description Active Date Today Diagnosis E11.621 Type 2 diabetes mellitus with foot ulcer 07/29/2019 No Yes E11.51 Type 2 diabetes mellitus with diabetic peripheral 07/29/2019 No Yes angiopathy without gangrene L97.521 Non-pressure chronic ulcer of other part of left foot 07/29/2019 No Yes limited to breakdown of skin L03.116 Cellulitis of left lower limb 08/12/2019 No Yes Inactive Problems Resolved Problems Electronic Signature(s) Signed: 01/20/2020 5:53:09 PM By: [name] MD Entered By: [name] on 01/20/2020  13:49:10 -------------------------------------------------------------------------------- Progress Note Details Patient Name: Date of Service: [name], [name] 01/20/2020 12:30 PM Medical Record [id number] Patient Account Number: [id number] Date of Birth/Sex: Treating RN: Apr 10, 1946 (73 y.o. [name] Primary Care Provider: [name] Other Clinician: Referring Provider: Treating Provider/Extender:[name], [name], [name] in Treatment: 25 Subjective History of Present Illness (HPI) 05/18/16; this is a 74year-old diabetic who is a type II diabetic on insulin. The history is that he traumatized his right foot developed a sore sometime in late March. Shortly thereafter he went on a cruise but he had to get off the cruise ship in [place] and fly urgently back to [place] where he was admitted to [facility] and ultimately underwent a transmetatarsal amputation by Dr. 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 According to the patient and his wife this wound never really healed. He was seen on 2 occasions in the wound care center in [place] and had vascular studies and then was referred urgently to Dr. Bridgett Larsson of vascular surgery. He underwent an angiogram on 05/10/16. Unfortunately nothing really could be done to improve his vascular status. He had a 75-90% stenosis in the midsegment of 1 segment of the posterior femoral artery. He had a patent popliteal, his anterior tibial occluded shortly after takeoff. Perineal had a greater than 90% stenosis posterior tibial is occluded feet had no distal collaterals feed distal  aspect of the transmetatarsal amputation site. The patient tells me that he had a prolonged period of Santyl by Dr. Doran Durand was some initial improvement but then this was stopped. I think they're only applying daily dressings/dry dressings. He has not had a recent x-ray of the right foot he did have one before his surgery in April. His wife by  the dimensions of the wound/surgical site being followed at home since 4/20. At that point the dimensions were 0.5 x 12 x 0.2 on 7/19 this was 1.8 x 6 x 0.4. He is not currently on any antibiotics. His hemoglobin A1c in early April was 12.9 at that point he was started on insulin. Apparently his blood sugars are much lower he has an appointment with Dr. Legrand Como Alteimer of endocrine next week. 05/29/16 x-ray of the area did not show osteomyelitis. I think he probably needs an MRI at this point. His wife is asking about something called"Yireh" cream which is not FDA approved. I have not heard of this. 06/22/16; MRI did not really suggest osteomyelitis. There was minimal marrow edema and enhancement in the stump of the second metatarsal felt to be secondary likely to postoperative change rather than osteomyelitis. The patient has arranged his own consultation with Dr. Andree Elk at [place], apparently their daughter lives in [place] and has some connection here. Any improvement in vascular supply by Dr. Andree Elk would of course be helpful. Dr. Bridgett Larsson did not feel that anything further could be done other than amputation if wound care did not result in healing or if the area deteriorates. 06/26/16; the patient has been to see Dr. Andree Elk at [place] and had an angiogram. He is going for a procedure on Thursday which will involve catheterization. I'm not sure if this is an anterograde or retrograde approach. He has been using Santyl to the wound 07/10/16; the patient had a repeat angiogram and angioplasty at [place] by Dr. Brunetta Jeans. His angiogram showed right CFA and profundal widely patent. The right as of a.m. popliteal artery were widely patent the right anterior tibial was occluded proximally and reconstitutes at the ankle. Peroneal artery was patent to the foot. Posterior tibial artery was occluded. The patient had angioplasty of the anterior tibial artery.. This was quite successful. He was recommended for Plavix as  well as aspirin. 07/17/16; the patient was close to be a nurse visit today however the outer dressing of the Apligraf fell off. Noted drainage. I was asked to see the wound. The patient is noted an odor however his wife had noted that. Drainage with Apligraf not necessarily a bad thing. He has not been systemically unwell 07/24/16; we are still have an issue with drainage of this wound. In spite of this I applied his second Apligraf. Medially the area still is probing to bone. 08/07/16; Apligraf reapplied in general wound looks improved. 08/21/16 Apligraf #4. Wound looks much better 09/04/16 patientt's wound again today continues to appear to improve with the application of the Apligraf's. He notes no increased discomfort or concerns at this point in time. 09/18/16; the patient returns today 2 weeks after his fifth application of Apligraf. Predictably three quarters of the width of this wound has healed. The deep area that probe to bone medially is still open. The patient asked how much out-of-pocket dollars would be for additional Apligraf's. 09/25/16; now using Hydrofera Blue. He has completed 5 Apligraf applications with considerable improvement in this deep open transmetatarsal amputation site. His wound is now a triangular-shaped wound on the  medial aspect. At roughly 12 to 2:00 this probes another centimeter but as opposed to in the past this does not probe to bone. The patient has been seen at [place] by Dr. Zenia Resides. He is not planning to do any more revascularization unless the wound stalls or worsens per the patient 10/02/16; 0.7 x 0.8 x 0.8. Unfortunately although the wound looks stable to improved. There is now easily probable bone. This hasn't been present for several weeks. Patient is not otherwise symptomatic he is not experiencing any pain. I did a culture of the wound bed 10/09/16. Deterioration last week. Culture grew MRSA and although there is improvement here with doxycycline prescribed  over the phone I'm going to try to get him linezolid 600 twice a day for 10 days today. 10/16/16; he is completing a weeks worth of linezolid and still has 3 more days to go. Small triangular-shaped open area with some degree of undermining. There is still palpable bone with a curet. Overall the area appears better than last week 10/20/16 he has completed the linezolid still having some nausea and vomiting but no diarrhea. He has exposed bone this week which is a deterioration. 10/27/16 patient now has a small but probing wound down to bone. Culture of this bone that I did last week showed a few methicillin-resistant staph aureus. I have little doubt that this represents acute/subacute osteomyelitis. The patient is currently on Doxy which I will continue he also completed 10 days of linezolid. We are now in a difficult situation with this patient's foot after considerable discussion we will send him back to see Dr. Doran Durand for a surgical opinion of this I'm also going to try to arrange a infectious disease consult at [facility] hopefully week and get this prior to her usual 4-6 weeks we having [place]. The patient clearly is going to need 6 weeks of IV vancomycin. If we cannot arrange this expediently I'll have to consider ordering this myself through a home infusion company 11/03/16; the patient now has a small in terms of circumference but probing wound. No bone palpable today. The patient remains on doxycycline 100 twice a day which should support him until he sees infectious disease at [facility] next week the following week on Wednesday I believe he has an appointment with Dr. Andree Elk at [facility] who is his vascular cardiologist. Finally he has an appointment with Dr. Doran Durand on 11/22/16 we have been using silver alginate. The patient's wife states they are having trouble getting this through [facility] 11/13/16; the patient was seen by infectious disease at [facility] in the 11th PICC line placed in  preparation for IV antibiotics. A tummy he has not going to get IV vancomycin o Ceftaroline. They've also ordered an MRI. Patient has a follow-up with Dr. Doran Durand on 11/22/16 and Dr. Andree Elk at [facility] tomorrow 11/23/16 the patient is on daptomycin as directed by infectious disease at [facility]. He is also been back to see Dr. Andree Elk at [facility]. He underwent a repeat arteriogram. He had a successful PTA of the right anterior tibial artery. He is on dual antiplatelete treatment with Plavix and aspirin. Finally he had the MRI of his foot in [place]. This showed cellulitis about the foot worse distally edema and enhancement in the reminiscent of the second metatarsal was consistent with osteomyelitis therefore what I was assuming to be the first metatarsal may be actually the second. He also has a fluid collection deep to the calcaneus at the level of the calcaneal spur  which could be an abscess or due to adventitial bursitis. He had a small tear in his Achilles 11/30/16; the patient continues on daptomycin as directed by infectious disease at [facility]. He is been revascularized by Dr. Andree Elk at [facility] in [place]. He has been to see Gretta Arab who was the orthopedic surgeon who did his original amputation. I have not seen his not however per the patient's wife he did not offer another surgical local surgical prodecure to remove involved bone. He verbalized his usual disbelief in not just hyperbarics but any medical therapy for this condition(osteomyelitis). I discussed this in detail with the patient today including answering the question about a BKA definitively "curing" the current condition. 12/07/16; the patient continues on daptomycin as directed by infectious disease at [facility]. This is directed at the MRSA that we cultured from his bone debridement from 12/22. Lab work today shows a white count of 8.7 hemoglobin of 10.5 which is microcytic and hypochromic differential count shows a  slightly elevated monocyte count at 1.2 eosinophilic count of 0.6. His creatinine is 1.11 sedimentation rate apparently is gone from 35-34 now 40. I explained was wife I don't think this represents a trend. His total CK is 48 12/14/16- patient is here for follow-up evaluation of his right TMA site. He continues to receive IV daptomycin per infectious disease. His serum inflammatory markers remain elevated. He complains of intermittent pain to the medial aspect of the TMA site with intermittent erythema. He voices no complaints or concerns regarding hyperbaric therapy. Overall he and his wife are expressing a frustration and discouragement regarrding the length of time of treatment. 12/21/16; small open wound at roughly the first or second metatarsal metatarsalphalyngeal joint reminiscence of his transmetatarsal amputation site he continues to receive IV daptomycin per infectious disease at [facility]. He will finish these a week tomorrow. He has lab work which I been copied on. His white count is 10.8 hemoglobin 8.9 MCV is low at 75., MCH low at 24.5 platelet count slightly elevated at 626. Differential count shows 70% neutrophils 10% monocytes and 10% eosinophils. His comprehensive metabolic panel shows a slightly low sodium at 133 albumin low at 3.1 total CK is normal at 42 sedimentation rate is much higher at 82. He has had iron studies that show a serum iron of 15 and iron binding capacity of 238 and iron saturation of 6. This is suggestive of iron deficiency. B12 and folate were normal ferritin at 113 The patient tells me that he is not eating well and he has lost weight. He feels episodically nauseated. He is coughing and gagging on mucus which she thinks is sinusitis. He has an appointment with his primary doctor at 5:00 this afternoon in [facility] 01/02/17; the patient developed a subacute pneumonitis. He was admitted to [facility] after a CT scan showed an extensive interstitial  pneumonitis [I have not yet seen this]. 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 He has since been discharged. He is not on oxygen. He feels fatigued and very short of breath with exertion. At [facility] the wound care nurse there felt that his wound was healed. He did complete his daptomycin and has follow-up with infectious disease on Friday. X-rays I did before he went to [facility] still suggested residual osteomyelitis in the anterior aspect of the second must metatarsal head. I'm not sure I would've expected any different. There was no other findings.  I actually think I did this because of erythema over the first metatarsal head reminiscent 01/11/17; the patient has eosinophilic pneumonitis. He has been reviewed by pulmonology and given clearance for hyperbarics at least that's what his wife says. I'll need to see if there is note in care everywhere. Apparently the prognosis for improvement of daptomycin induced eosinophilic granulocyte is is 3 months without steroids. In the meantime infectious disease has placed him on doxycycline until the wound is closed. He is still is lost a lot of weight and his blood sugars are running in the mid 60s to low 80s fasting and at all times during the day 01/18/17; he has had adjustments in his insulin apparently his blood sugars in the morning or over 100. He wants to restart his hyperbaric treatment we'll do this at 1:00. 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 I think there is good reason to complete his treatments in order to give him the best chance of maintaining a healed status and these DFU 3 wounds with MRSA infection in the bone 02/15/17; the patient was seen today in conjunction with HBO. He completed hyperbaric oxygen today. The open area on his transmetatarsal site has  remained closed. There was an area of erythema when I saw him earlier in the week on the posterior heel although that is resolved as of today as well. He has been using a cam walker. This is a patient who came to [place] after a transmetatarsal amputation that was necrotic and dehisced. He required revascularization percutaneously on 2 different occasions by Dr. Andree Elk of invasive cardiology at [facility]. 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 He went to [facility] infectious disease and had IV daptomycin for 5 weeks before developing eosinophilic pneumonitis and requiring an admission to hospital/ICU. He made a good recovery and is continued on doxycycline since. As mentioned his foot is closed now. He has a follow-up with Dr. Andree Elk tomorrow. He is going to [employer] on Monday for a custom-made shoe READMISSION Last visit Dr. Andree Elk [facility] 02/16/17 1. Critical limb ischemia of the RLE:  s/p transmetatarsal amputation of the RLE, now completely healed.  s/p right ATA percutaneous revascularization procedure x2, most recently 11/20/2016 s/p PTA of right AT 100% to less than 20% with a 2.5 x 200 balloon.  He does have some residual osteomyelitis, but given the wound is closed, the orthopedist has recommended to follow. He has a fitting for a special shoe coming up next week. He has completed a course of daptomycin and doxycycline and he has been discharged by ID. He has completed a course of hyperbaric therapy.  Continue Continue medical management with aspirin, Plavix and statin therapy. We will discuss ongoing Plavix therapy at follow-up in 6 months.  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 Given no symptoms, we will conservatively manage. He doesn't want to do  any more invasive studies at this time, which is reasonable. The patient arrives today out of 2 concerns both on the right transmetatarsal site. 1 at the level of the reminiscent fifth metatarsal head and the other at roughly the first or second. Both of these look like dark subcutaneous discoloration probably subdermal bleeding. He is recently obtained new adaptive footwear for  the right foot. He also has a callus on the left fifth dorsal toe however he follows with podiatry for this and I don't think this is any issue. ABIs in this clinic today were 0.66 on the right and 1.06 on the left. On entrance into our clinic initially this was 0.95 and 0.92. He does not describe current claudication. They're going away on a cruise in 8 weeks and I think are trying to do the month is much as they can proactively. They follow with podiatry and have an appointment with Dr. Andree Elk in October READMISSION 06/25/18 This is a patient that we have not seen in almost a year. He is a type II diabetic with known PAD. He is followed by Dr. Andree Elk of interventional cardiology at [facility] in [place]. He is required revascularization for significant PAD. When we first saw him he required a transmetatarsal amputation. He had underlying osteomyelitis with a nonhealing surgical wound. This eventually closed with wound care, IV antibiotics and hyperbaric oxygen. They tell me that he has a modified shoe and he is very active walking up to 4 miles a day. He is followed by Dr. Geroge Baseman of podiatry. His wife states that he underwent a removal of callus over this site on July 9. She felt there may be drainage from this site after that although she could never really determined and there was callus buildup again. On 06/01/18 there was pressure bleeding through the overlying callus. he was given a prescription for 7 days of Bactrim. Fortuitously he has an appointment with Dr. Andree Elk on 06/04/18 and he immediately underwent  revascularization of the right leg although I have not had a chance to review these records in care everywhere. This was apparently done through anterior and retrograde access. On 06/06/18 he had another debridement by Dr. Bernette Mayers. Vitamin soaking with Epsom salts for 20 minutes and applying calcium alginate. The original trans-met was on April 2017 I believe by Dr. Doran Durand. His ABI in our clinic was noncompressible today. 07/02/18; x-ray I ordered last week was negative for osteomyelitis. Swab culture was also negative. He is going to require an MRI which I have ordered today. 07/09/18; surprisingly the MRI of the foot that I ordered did not show osteomyelitis. He did suggest the possibility of cellulitis. For this reason I'll go ahead and give him a 10 day course of doxycycline. Although the previous culture of this area was negative 07/16/18; it arrives with the wound looking much the same. Roughly the same depth. He has thick subcutaneous tissue around the wound orifice but this still has roughly the same depth. Been using silver alginate. We applied Oasis #1 today 07/23/2018; still having to remove a lot of callus and thick subcutaneous tissue to actually define the wound here. Most of this seems to have closed down yet he has a comma shaped divot over the top of the area that still I think is open. We applied Oasis #2 His wife expressed concern about the tip of his left great toe. This almost looks like a small blister. She also showed it today to her podiatrist Dr. Geroge Baseman who did not think this was anything serious. I am not sure is anything serious either however I think it bears some watching. He is not in any pain however he is insensate 07/30/2018;; still on a lot of nonviable tissue over the surface of the wound however cleaning this up reveals a more substantial wound orifice but less of a probing wound depth.  This is not probed to bone. There is no evidence of infection The wife is still  concerned about a non-open area on the tip of his left great toe. Almost feels like a bony outgrowth. She had previously showed this to podiatry. I do not think this is a blister. A friction area would be possible although he is really not walking according to his wife. He had a small skin tag on his right buttock but no open wound here either 08/06/2018; we applied a third Oasis last week. Unfortunately there is really no improvement. Still requiring extensive debridement to expose the wound bed from a horizontal slitlike depression. I still have not been able to get this to fill in properly. On the positive side there is now no probable bone from when he first came into the facility. I changed him to silver alginate today after a reasonably aggressive debridement 08/13/2018; once again the patient comes in with skin and subcutaneous tissue closing over the small probing area with the underlying cavity of the wound on the right TMA site. I applied silver alginate to this last week. Prior to that we used Oasis x3 still not able to get this area granulating. He does not have a probing area of the bone which is an improvement from when I spur started working on this and an MRI did not suggest osteomyelitis. I do not see evidence of infection here but I am increasingly concerned about why I cannot get this area to granulate. Each time I debrided this is looks like this is simply a matter of getting granulation to fill in the hole and then getting epithelialization. This does not seem to happen Also I sent him back to see podiatry Dr. Earleen Newport about the what felt to be bony outgrowth on the tip of his left great toe. Apparently after heel he left the clinic last week or the next day he developed a blood blister. He did see Dr. Earleen Newport. He went on to have a debridement of the medial nail cuticle he now has an open area here as well as some denuded skin. They did an x-ray apparently does have a bony  outgrowth or spur but I am not able to look at this. They are using topical antibiotics apparently there was some suggested he use Santyl. Patient's wife was anxious for my opinion of this 08/20/2018; we are able to keep the wound open this time instead of the thick subcutaneous tissue closing over the top of it however unfortunately once again this probes to bone. I do not see any evidence of infection and previous MRI did not show osteomyelitis. I elected to go back to the Oasis to see if we can stimulate some granulation. Last saw his vascular interventional cardiologist Dr. Andree Elk at the beginning of August and he had a repeat procedure. Nevertheless I wonder how much blood flow he has down to this area With regards to the left first toe he is seeing Dr. Earleen Newport next week. They are applying Bactroban to this area. 08/27/2018 ooOnce again he comes in with thick eschar and subcutaneous tissue over the top of the small probing hole. This does not appear to go down to bone but it still has roughly the same depth. I reapplied Oasis today ooOver the left great toe there appears to be more of the wound at the tip of his toe than there was last week. This has an eschar on the surface of it. Will change to Santyl.  There is seeing podiatry this afternoon 09/03/2018 ooHe comes in today with the area on the transmetatarsal's site with a fair amount of callus, nonviable tissue over the circumference but it was not closed. I removed all of this as well as some subcutaneous debris and reapplied Oasis. There is no exposed bone ooThe area over the tip of the left great toe started off as a nodule of uncertain etiology. He has been followed with podiatry. They have been removing part of the medial nail bed. He has nonviable tissue over the wound he been using Santyl in this area 09/10/18 ooUnfortunately comes in with neither wound area looking improved. The transmetatarsal amputation site once Again has  nonviable debris over the surface requiring debridement. Unfortunately underneath this there is nothing that looks viable and this once again goes right down to bone. There is no purulent drainage and no erythema. ooAlso the surgical wound from podiatry on the left first toe has an ischemic-looking eschar over the surface of the tip of the toe I have elected not to attempt candidly debride this ooHis wife as arranged for him to have follow-up noninvasive studies in [place] under the care of Dr. Andree Elk clinic. The question is he has known severe PAD. They had recently seen him in August. He has had revascularizations in both legs within the last 4 or 5 months. He is not complaining of pain and I cannot really get a history of claudication. He has not systemically unwell 09/20/2018 the patient has been to [facility] and been revascularized by Dr. Andree Elk earlier this week. Apparently he was able to open up the anterior tibial artery although I have not actually seen his formal report. He is going for an attempt to revascularize on the left on Monday. He is apparently working with an investigational stent for lower extremity arteries below the knee and he has talked to the patient about placing that on Monday if possible. The patient has been using silver alginate on the transmetatarsal amputation site and Santyl on the left 09/30/2018; patient had his revascularization on the left this apparently included a standard approach as well as a more distal arterial catheterization although I do not have any information on this from Dr. Andree Elk. In fact I do not even see the initial revascularization that he had on the right. I have included the arterial history from Dr. Andree Elk last note however below; ASSESSMENT/PLAN: 1. Hx of Critical limb ischemia bilateral lower extremities, PAD: -s/p transmetatarsal amputation of the RLE. -s/p right ATA percutaneous revascularization procedure x2, most recently  11/20/2016 s/p PTA of right AT 100% to less than 20% with a 2.5 x 200 balloon. 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 -03/21/2018 s/p PTA of 90% left popliteal artery to <10% with a 5x20 cutting balloon, PTA of 90% left peroneal to <20% with a 3x20 balloon, PTA of 100% left AT to <20% with a 2.5x220 balloon. 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 Will plan on the LLE soon after. Risks/benefits of procedure have been discussed and patient has elected to proceed. We have been using endoform to the right TMA amputation site wound and Santyl to the left great toe 10/07/2018; the area on the tip of his left  great toe looked better we have been using Santyl here. We continue to have a very difficult probing hole on the right TMA amputation site we have been using endoform. I went on to use his fifth Oasis today 10/14/2018; the tip of the left great toe continues to look better. We have been using Santyl here the surface however is healthy and I think we can change to an alginate. The right TMA has not changed. Once again he has no superficial opening there is callus and thick subcutaneous tissue over the orifice once you remove this there is the probing area that we have been dealing with without too much change. There is no palpable bone I have been placing Oasis here and put Oasis #6 in this today after a more vigorous debridement 10/21/18; the left great toe still has necrotic surface requiring debridement. The right TMA site hasn't changed in view of the thick callus over the wound bed. With removal of this there is still the opening however this does not appear to have the same depth. Again there is no palpable bone.  Oasis was replaced 10/28/2018; patient comes in with both wounds looking worse. The area over the first toe tip is now down to bone. The area over the TMA site is deeper and down to bone clearly with a increase in overall wound area. Equally concerning on the right TMA is the complete absence of a pulse this week which is a change. We are not even able to Doppler this. On the right he has a noncompressible ABI greater than 1.4 11/04/2018. Both wounds look somewhat worse. X-rays showed no osteomyelitis of the right foot but on the left there was underlying osteomyelitis in the left great toe distal phalanx. I been on the phone to Dr. Andree Elk surface at [facility] in [place] and they are arranging for another angiogram on the right on Monday. I have him on doxycycline for the osteomyelitis in the left great toe for now. Infectious disease may be necessary 1/17; 2-week hiatus. Patient was admitted to hospital at [facility]. My understanding is he underwent an angioplasty of the right anterior tibial artery and had stents placed in the left anterior artery and the left tibial peroneal trunk. This was done by Dr. Andree Elk of interventional radiology. There is no major change in either 1 of the wounds. They have been using Aquacel Ag. As far as they are aware no imaging studies were done of the foot which is indeed unfortunate. I had him on doxycycline for 2 weeks since we identified the osteomyelitis in the left great toe by plain x-ray. I have renewed that again today. I am still suspicious about osteomyelitis in the amputation site and would consider doing another MRI to compare with the one done in September. The idea of hyperbaric oxygen certainly comes up for discussion 1/24; no major change in either wound area. I have him on doxycycline for osteomyelitis at the tip of the left great toe. As noted he has been previously and recently revascularized by Dr. Andree Elk at [place]. He is tolerating the doxycycline well.  For some reason we do not have an infectious disease consult yet. Culture of drainage from the right foot site last week was negative 1/31; MRI of the right foot did not show osteomyelitis of the right ankle and foot. Notable for a skin ulceration overlying the second metatarsal stump with generalizing soft tissue edema of the ankle and foot consistent with cellulitis. Noted to have a  partial-thickness tear of the Achilles tendon 7.5 cm proximal to the insertion Nothing really new in terms of symptoms. Patient's area on the tip of the left great toe is just about closed although he still has the probing area on the metatarsal amputation site. Appointment with Dr. Linus Salmons of infectious disease next week 2/7; Dr. Novella Olive did not feel that any further antibiotics were necessary he would follow-up in 2 months. The left great toe appears to be closed still some surface callus that I gently looked under high did not see anything open or anything that was threatening to be open. He still has the open area on the mid part of his TMA site using endoform 2/14; left great toe is closed and he is completing his doxycycline as of last Sunday. He is not on any antibiotics. Unfortunately out of the right foot his wife noticed some subdermal hemorrhage this week. He had been walking 2 miles I had given him permission to do so this is not really a plantar wound. Using endoform to this wound but I changed to silver alginate this week 2/21; left great toe remains closed. Culture last week grew Streptococcus angiosis which I am not really familiar with however it is penicillin sensitive and I am going to put him on Augmentin. Not much change in the wound on the right foot the deep area is still probing precariously close to bone and the wound on the margin of the TMA is larger. 2/28; left great toe remains closed. He is completing the Augmentin I gave him last week. Apparently the anterior tibial artery on the right is  totally reoccluded again. This is being shown to Dr. Andree Elk at [place] to see if there is anything else that can be done here. He has been using silver alginate strips on the right 3/6; left great toe remains closed. He sees Dr. Andree Elk on Monday. The area on the plantar aspect of the right foot has the same small orifice with thick callused tissue around this. However this time with removal of the callus tissue the wound is open all the way along the incision line to the end medially. We have been using silver alginate. 3/13; left toe remains closed although the area is callused. He sees Dr. Jacqualyn Posey of podiatry next week. The area on the plantar right foot looked a lot better this week. Culture I did of this was negative we use silver alginate. He is going next week for an attempt at revascularization by Dr. Andree Elk 3/23; left toe remains closed although the area is callused. He will see Dr. Jacqualyn Posey in follow-up. The area on the right plantar foot continues to look surprisingly better over the last 3 visits. We have been using silver alginate. The revascularization he was supposed to have by Dr. Andree Elk at [facility] in [place] has been canceled [facility] procedure] 4/6; the right foot remains closed albeit callused. Podiatry canceled the appointment with regards to the left great toe. He has not seen Dr. Andree Elk at [facility] but thinks that Dr. Andree Elk has "done all he can do". He would be a candidate for the hemostaemix trial Readmission 07/29/2019 Mr. [name] is a man we know well from at least 3 previous stays in this clinic. He is a type II diabetic with severe PAD followed by Dr. Andree Elk at [facility] in [place]. During his last stay here he had a probing wound bone in his right TMA site and a episode of osteomyelitis on the tip  of the left great toe at a surgical site. So far everything in both of these areas has remained closed. About 2 weeks ago he went to see his podiatrist [facility] Dr. Babs Bertin. He had a thick callus on the left fifth metatarsal head that was shaved. He has developed an open wound in this area. They have been offloading this in his diabetic shoes. The patient has not had any more revascularizations by Dr. Andree Elk since the last time he was here. ABI in our clinic at the posterior tibial on the left was 1.06 10/6; no real change in the area on the plantar met head. Small wound with 2 mm of depth. He has thick skin probably from pressure around the wound. We have been using silver alginate 10/13; small wound in the left fifth plantar met head. Arrives today with undermining laterally and purulent drainage. Our intake nurse cultured this. His wife stated they noticed a change in color over the last day or 2. He is not systemically unwell 10/19; small wound on the fifth plantar metatarsal head. Culture I did last week showed Staphylococcus lugdunensis. Although this could be a skin contaminant the possibility of a skin and soft tissue infection was there. I did give him empiric doxycycline which should have covered this. We are using silver alginate to the wound. We put him in a total contact cast today. 10/22; small wound on the fifth plantar metatarsal head. He has completed antibiotics. He also has severe PAD which worries me about just about any wound on this man's foot 10/29; small superficial area on the fifth plantar metatarsal head. Measuring slightly smaller. He is not currently on any antibiotics. I been using a total contact cast in this man with very severe PAD but he seems to be tolerating this well 11/5; left plantar fifth metatarsal head. Silver alginate being used under a total contact cast 11/12; wound not much different than last week. Using a #15 scalpel debridement around the wound. Still silver collagen under a total contact cast. He has severe PAD and that may be playing a role in this 11/19; disappointing that the wound is not really  changed that much. I think it is come down in overall surface area because originally this was on the lateral part of the fifth metatarsal head however recently it is not really changed. Most of this is filled in but it will not epithelialized there is surface debris on this which may be mostly related to ischemia. They have an appointment with Dr. Andree Elk on 12/9 09/24/2019 on evaluation today patient appears to be doing somewhat better with regard to the wound on the left fifth metatarsal head. Fortunately there does not appear to be any signs of active infection at this time. No fevers, chills, nausea, vomiting, or diarrhea. The wound does not appear to be completely closed and I do feel like the Hydrofera Blue is helping to some degree although I feel like the cast as well as keeping things from breaking down or getting any larger at least. As far as his wound overview it shows that the overall size of the wound is slightly smaller but really maintaining within the realm of about the same. He had no troubles with the cast which is good news. 12/1; left fifth metatarsal head. Perhaps somewhat more vibrant. We have been using Hydrofera Blue. He sees Dr. Andree Elk at [place] 1 week tomorrow. He be back next week and I hope to have a  better idea which way the wound is going however I will not cast him next week in case Dr. Andree Elk wants to reevaluate things in his foot. The left leg is the leg with the stent in place and I wonder whether these are going to have to be reevaluated. 12/8; left fifth metatarsal head. Quite a bit better this week. Only a small open area remains. He sees Dr. Andree Elk at [place] tomorrow. Dr. Andree Elk is previously done his vascular interventions. He has 2 stents in the left leg as I remember things. I therefore will not put him in a total contact cast today. He will be using a forefoot off loader. We will use silver collagen on the wound. We will see him again next week 12/15; left fifth  metatarsal head. He saw Dr. Andree Elk and is scheduled for an angiogram on Thursday. Unfortunately although his wound was a lot better last week it is really deteriorated this week. Small punched-out hole with depth and overhanging tissue. We have been using Hydrofera Blue 12/22; patient had an 80% stenosis proximal to the stent I believe in the below-knee popliteal artery. This was opened by Dr. Andree Elk. According the patient the stent itself was satisfactory. I have not been able to review this note. 12/29; patient arrives today with the wound about the same size some undermining medially. We have been using Hydrofera Blue under a total contact cast and that is what we will do again today 11/04/2019. Slightly smaller in orifice but about 4 mm undermining almost circumferentially. We have been using Hydrofera Blue. Apparently the Hydrofera Blue did not stay on the wound after we removed the cast. Some minor looking cast irritation on the right lateral 1/12; unfortunately things did not go well this week. Arrives in clinic today with a deeper wound with undermining more concerning a area of pus. Specimen was obtained for culture. We are not going to be able to put him in a cast today. 1/19; purulent material last week which I cultured showed Enterobacter cloacae. He was on ampicillin previously prescribed by his primary doctor which should not have covered this however mysteriously the wound looks somewhat better. There is less depth less erythema certainly no drainage. He will need a course of ciprofloxacin which I will provide today. 1/26; he has completed the ciprofloxacin. I don't think he needs any additional antibiotics. The areas on the left fifth plantar met head. We've been using silver alginate 2/2; comes in today with 0.4 cm of circumferential undermining around a small wound in this area. We have been using silver alginate with a forefoot offloading boot 2/9; again the wound appears larger  nonviable surface and with 0.4 cm roughly of circumferential undermining. We have been using silver alginate with a forefoot offloading boot. The wound does not look infected however his wife is quick to point out about swelling on the dorsal foot. 2/16; not much change. Comes in with a small wound with a nonviable necrotic surface. Again marked undermining that I had totally removed last week. He had a arterial Doppler last week in Dr. Andree Elk office at [facility]. They have not heard these results. They are somewhat frustrated. 2/23; if anything this is worse. Undermining increased depth. Nonviable surface that looks pale. According his wife is TBI on the right was 0.22 although I have not verified this. He is going for an angiogram with Dr. Andree Elk next Monday. We are using polymen and offloading in a forefoot offloading boot. 3/2; the patient  was revascularized yesterday by [facility]. He had successful PTA of the left peroneal 60% to less than 20% and successful PTA of the left ATA 100% to less than 20%. He now has a pulse in the left foot Now that he has some additional blood flow there is not a lot of options here. On one hand we want activity to help keep blood flow augmented on the other hand pressure relief is going to be paramount if we have any chance to get this to close. After some discussion with his wife and patient I went ahead and put him in a total contact cast. 3/9; he arrives in clinic today with some debris over the wound surface. We use silver collagen under a total contact cast after his revascularization by Dr. Andree Elk 3/16; wound about the same depth at 0.5 cm. Surface area perhaps slightly less but it is the depth the really is important. We have been using silver collagen 3/23; not much change here. There is still undermining medially the depth is about the same tissue looks somewhat better. We switched to Iodoflex last week to try to get a better looking  wound surface. He does have a small abrasion over the proximal interphalangeal joint dorsally. I this is so small it is difficult even to tell whether it is open. Some abrasion between the fifth toe and the webspace as well Objective Constitutional Sitting or standing Blood Pressure is within target range for patient.. Pulse regular and within target range for patient.Marland Kitchen Respirations regular, non-labored and within target range.. Temperature is normal and within the target range for the patient.Marland Kitchen Appears in no distress. Vitals Time Taken: 12:44 PM, Height: 69 in, Source: Stated, Weight: 210 lbs, Source: Stated, BMI: 31, Temperature: 98.1 F, Pulse: 78 bpm, Respiratory Rate: 18 breaths/min, Blood Pressure: 114/69 mmHg, Capillary Blood Glucose: 111 mg/dl. General Notes: glucose per pt report this am General Notes: Wound exam; surface is better although the depth and the undermining of the wound medially is about the same. Minor area which almost looks like an abrasion on the proximal interphalangeal joint Integumentary (Hair, Skin) Wound #5 status is Open. Original cause of wound was Gradually Appeared. The wound is located on the Left Metatarsal head fifth. The wound measures 0.8cm length x 0.8cm width x 0.6cm depth; 0.503cm^2 area and 0.302cm^3 volume. There is Fat Layer (Subcutaneous Tissue) Exposed exposed. There is no tunneling noted, however, there is undermining starting at 12:00 and ending at 12:00 with a maximum distance of 0.5cm. There is a medium amount of serous drainage noted. The wound margin is well defined and not attached to the wound base. There is small (1-33%) pink, pale granulation within the wound bed. There is a large (67-100%) amount of necrotic tissue within the wound bed including Adherent Slough. 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 There was a Total Contact Cast Procedure by [name]., MD. Post procedure Diagnosis Wound #5: Same as Pre-Procedure Plan Follow-up Appointments: Return Appointment in 1 week. Dressing Change Frequency: Wound #5 Left Metatarsal head fifth: Do not change entire dressing for one week. Wound Cleansing: Wound #  5 Left Metatarsal head fifth: May shower with protection. Primary Wound Dressing: Wound #5 Left Metatarsal head fifth: Iodoflex Other: - pad right great toe for protection Secondary Dressing: Dry Gauze Off-Loading: Total Contact Cast to Left Lower Extremity 1. Still Iodoflex 2. I am hopeful to use a skin substitute like Dermagraft 3. Still under a total contact cast 3. 4 I think the abrasion on his fifth toe is perhaps a cast issue. We will try to pad this area Electronic Signature(s) Signed: 01/20/2020 5:53:09 PM By: [name] MD Entered By: [name] on 01/20/2020 13:54:06 -------------------------------------------------------------------------------- Total Contact Cast Details Patient Name: Date of Service: JERMANY, RIMEL 01/20/2020 12:30 PM Medical Record [id number] Patient Account Number: [id number] Date of Birth/Sex: 06/16/1946 (73 y.o. M) Treating RN: [name] Primary Care Provider: [name] Other Clinician: Referring Provider: Treating Provider/Extender:Thompson Mckim, [name], [name] in Treatment: 25 Total Contact Cast Applied for Wound Assessment: Wound #5 Left Metatarsal head fifth Performed By: Physician [name]., MD Post Procedure Diagnosis Same as Pre-procedure Electronic Signature(s) Signed: 01/20/2020 5:53:09 PM By:  [name] MD Entered By: [name] on 01/20/2020 13:49:29 -------------------------------------------------------------------------------- SuperBill Details Patient Name: Date of Service: Todd, Guzman 01/20/2020 Medical Record [id number] Patient Account Number: [id number] Date of Birth/Sex: Treating RN: May 18, 1946 (73 y.o. [name]) Dolores Lory, [name] Primary Care Provider: [name] Other Clinician: Referring Provider: Treating Provider/Extender:[name], [name], [name] in Treatment: 25 Diagnosis Coding ICD-10 Codes Code Description E11.621 Type 2 diabetes mellitus with foot ulcer E11.51 Type 2 diabetes mellitus with diabetic peripheral angiopathy without gangrene L97.521 Non-pressure chronic ulcer of other part of left foot limited to breakdown of skin L03.116 Cellulitis of left lower limb Facility Procedures CPT4 Code: [id number] Description: 29445 - APPLY TOTAL CONTACT LEG CAST ICD-10 Diagnosis Description E11.621 Type 2 diabetes mellitus with foot ulcer Modifier: Quantity: 1 Physician Procedures CPT4 Code: [id number] Description: 20254 - WC PHYS APPLY TOTAL CONTACT CAST ICD-10 Diagnosis Description E11.621 Type 2 diabetes mellitus with foot ulcer Modifier: Quantity: 1 Electronic Signature(s) Signed: 01/20/2020 5:53:09 PM By: [name] MD Entered By: [name] on 01/20/2020 13:54:16

## 2020-01-27 ENCOUNTER — Other Ambulatory Visit: Payer: Self-pay

## 2020-01-27 ENCOUNTER — Ambulatory Visit (HOSPITAL_COMMUNITY)
Admission: RE | Admit: 2020-01-27 | Discharge: 2020-01-27 | Disposition: A | Discharge status: Home / Self Care | Payer: Medicare Other | Source: Ambulatory Visit | Attending: Internal Medicine | Admitting: Internal Medicine

## 2020-01-27 ENCOUNTER — Encounter (HOSPITAL_BASED_OUTPATIENT_CLINIC_OR_DEPARTMENT_OTHER): Payer: Medicare Other | Admitting: Internal Medicine

## 2020-01-27 ENCOUNTER — Other Ambulatory Visit (HOSPITAL_COMMUNITY): Payer: Self-pay | Admitting: Internal Medicine

## 2020-01-27 DIAGNOSIS — E1169 Type 2 diabetes mellitus with other specified complication: Secondary | ICD-10-CM | POA: Diagnosis present

## 2020-01-27 DIAGNOSIS — E11621 Type 2 diabetes mellitus with foot ulcer: Secondary | ICD-10-CM | POA: Insufficient documentation

## 2020-01-27 DIAGNOSIS — M869 Osteomyelitis, unspecified: Secondary | ICD-10-CM | POA: Diagnosis present

## 2020-01-27 DIAGNOSIS — L97509 Non-pressure chronic ulcer of other part of unspecified foot with unspecified severity: Secondary | ICD-10-CM | POA: Insufficient documentation

## 2020-02-02 NOTE — Progress Notes (Signed)
Guzman, Todd ([id number]) Visit Report for 01/27/2020 Arrival Information Details Patient Name: Date of Service: Todd, Guzman 01/27/2020 12:30 PM Medical Record [id number] Patient Account Number: [id number] Date of Birth/Sex: November 12, 1945 (74 y.o. Male) Treating RN: Shawn Stall Primary Care Nykiah Ma: [name] Other Clinician: Referring Mykeria Garman: Treating Minaal Struckman/Extender:[name], [name], [name] in Treatment: 26 Visit Information History Since Last Visit Added or deleted any medications: No Patient Arrived: Ambulatory Any new allergies or adverse reactions: No Arrival Time: 12:46 Had a fall or experienced change in No Accompanied By: wife activities of daily living that may affect Transfer Assistance: None risk of falls: Patient Identification Verified: Yes Signs or symptoms of abuse/neglect since No Secondary Verification Process Yes last visito Completed: Hospitalized since last visit: No Patient Requires Transmission-Based No Implantable device outside of the clinic No Precautions: excluding Patient Has Alerts: No cellular tissue based products placed in the center since last visit: Has Dressing in Place as Prescribed: Yes Has Footwear/Offloading in Place as Yes Prescribed: Left: Total Contact Cast Pain Present Now: No Electronic Signature(s) Signed: 01/28/2020 5:08:19 PM By: Shawn Stall Entered By: Shawn Stall on 01/27/2020 13:03:38 -------------------------------------------------------------------------------- Encounter Discharge Information Details Patient Name: Date of Service: Todd, Guzman 01/27/2020 12:30 PM Medical Record [id number] Patient Account Number: [id number] Date of Birth/Sex: 09/07/1946 (74 y.o. Male) Treating RN: Cherylin Mylar Primary Care Talley Casco: [name] Other Clinician: Referring Laquenta Whitsell: Treating Matasha Smigelski/Extender:[name], [name], [name] in Treatment: 26 Encounter Discharge  Information Items Post Procedure Vitals Discharge Condition: Stable Temperature (F): 98.4 Ambulatory Status: Ambulatory Pulse (bpm): 81 Discharge Destination: Home Respiratory Rate (breaths/min): 18 Transportation: Private Auto Blood Pressure (mmHg): 118/70 Accompanied By: wife Schedule Follow-up Appointment: Yes Clinical Summary of Care: Patient Declined Electronic Signature(s) Signed: 02/02/2020 4:55:48 PM By: Cherylin Mylar Entered By: Cherylin Mylar on 01/27/2020 13:40:36 -------------------------------------------------------------------------------- Lower Extremity Assessment Details Patient Name: Date of Service: Todd, Guzman 01/27/2020 12:30 PM Medical Record [id number] Patient Account Number: [id number] Date of Birth/Sex: 05-25-1946 (74 y.o. Male) Treating RN: Shawn Stall Primary Care Temitope Griffing: [name] Other Clinician: Referring Allysson Rinehimer: Treating Shironda Kain/Extender:[name], [name], [name] in Treatment: 26 Edema Assessment Assessed: [Left: Yes] [Right: No] Edema: [Left: Ye] [Right: s] Calf Left: Right: Point of Measurement: cm From Medial Instep 42 cm cm Ankle Left: Right: Point of Measurement: cm From Medial Instep 29 cm cm Vascular Assessment Pulses: Dorsalis Pedis Palpable: [Left:Yes] Electronic Signature(s) Signed: 01/28/2020 5:08:19 PM By: Shawn Stall Entered By: Shawn Stall on 01/27/2020 13:04:15 -------------------------------------------------------------------------------- Multi Wound Chart Details Patient Name: Date of Service: Todd, Guzman 01/27/2020 12:30 PM Medical Record [id number] Patient Account Number: [id number] Date of Birth/Sex: 02-10-1946 (74 y.o. 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] Comorbid History: [5:Cataracts, Chronic sinus N/A problems/congestion, Coronary Artery Disease, Hypertension, Peripheral Arterial Disease, Type II Diabetes, Osteoarthritis, Neuropathy] Date Acquired: [5:07/14/2019] [N/A:N/A] Weeks of Treatment: [5:26] [N/A:N/A] Wound Status: [5:Open] [N/A:N/A] Measurements L x W x D 1.2x1x0.6 [N/A:N/A] (cm) Area (cm) : [5:0.942] [N/A:N/A] Volume (cm) : [5:0.565] [N/A:N/A] % Reduction in Area: [5:-1395.20%] [N/A:N/A] % Reduction in Volume: -4246.20% [N/A:N/A] Classification: [5:Grade 2] [N/A:N/A] Exudate Amount: [5:Medium] [N/A:N/A] Exudate Type: [5:Serous] [N/A:N/A] Exudate Color: [5:amber] [N/A:N/A]  Wound Margin: [5:Well defined, not attached N/A] Granulation Amount: [5:Small (1-33%)] [N/A:N/A] Granulation Quality: [5:Pink, Pale] [N/A:N/A] Necrotic Amount: [5:Large (67-100%)] [N/A:N/A] Exposed Structures: [5:Fat Layer (Subcutaneous N/A Tissue) Exposed: Yes Fascia: No Tendon: No Muscle: No Joint: No Bone: No] Epithelialization: [5:None] [N/A:N/A] Debridement: [5:Debridement - Excisional] [N/A:N/A] Pre-procedure [5:13:15] [N/A:N/A] Verification/Time Out Taken: Pain Control: [5:Lidocaine 5% topical ointment] [N/A:N/A] Tissue Debrided: [5:Callus, Subcutaneous, Slough] [N/A:N/A] Level: [5:Skin/Subcutaneous Tissue] [N/A:N/A] Debridement Area (sq cm):1.2 [N/A:N/A] Instrument: [5:Curette] [N/A:N/A] Bleeding: [5:Moderate] [N/A:N/A] Hemostasis Achieved: [5:Pressure] [N/A:N/A] Procedural Pain: [5:0] [N/A:N/A] Post Procedural Pain: [5:0] [N/A:N/A] Debridement Treatment Procedure  was tolerated [N/A:N/A] Response: [5:well] Post Debridement [5:1.2x1x0.6] [N/A:N/A] Measurements L x W x D (cm) Post Debridement [5:0.565] [N/A:N/A] Volume: (cm) Assessment Notes: [5:maceration present to periwound.] [N/A:N/A N/A] Treatment Notes Electronic Signature(s) Signed: 01/28/2020 5:40:01 PM By: [name] RN Signed: 02/02/2020 7:44:34 AM By: [name] MD Entered By: [name] on 01/27/2020 13:29:59 -------------------------------------------------------------------------------- Multi-Disciplinary Care Plan Details Patient Name: Date of Service: RAYMON, SCHLARB 01/27/2020 12:30 PM Medical Record [id number] Patient Account Number: [id number] Date of Birth/Sex: December 11, 1945 (74 y.o. Male) Treating RN: [name] Primary Care Saralyn Willison: [name] Other Clinician: Referring Myiah Petkus: Treating Arney Mayabb/Extender:[name], [name], [name] in Treatment: 26 Active Inactive Wound/Skin Impairment Nursing Diagnoses: Knowledge deficit related to ulceration/compromised skin integrity Goals: Patient/caregiver will verbalize understanding of skin care regimen Date Initiated: 07/29/2019 Target Resolution Date: 01/30/2020 Goal Status: Active Ulcer/skin breakdown will have a volume reduction of 30% by week 4 Target Resolution Date Initiated: 07/29/2019 Date Inactivated: 10/21/2019 Date: 10/10/2019 Unmet Reason: comorbities. Goal Status: Unmet blood flow Ulcer/skin breakdown will have a volume reduction of 50% by week 8 Date Initiated: 10/21/2019 Date Inactivated: 12/02/2019 Target Resolution Date: 11/21/2019 Goal Status: Unmet Unmet Reason: comorbities Ulcer/skin breakdown will have a volume reduction of 80% by week 12 Date Initiated: 12/02/2019 Date Inactivated: 01/06/2020 Target Resolution Date: 01/02/2020 Goal Status: Unmet Unmet Reason: comorbities Interventions: Assess patient/caregiver ability to obtain necessary supplies Assess patient/caregiver ability  to perform ulcer/skin care regimen upon admission and as needed Assess ulceration(s) every visit Notes: Electronic Signature(s) Signed: 01/28/2020 5:40:01 PM By: [name] RN Entered By: [name] on 01/27/2020 13:10:25 -------------------------------------------------------------------------------- Pain Assessment Details Patient Name: Date of Service: WILMAR, PRABHAKAR 01/27/2020 12:30 PM Medical Record [id number] Patient Account Number: [id number] Date of Birth/Sex: Aug 16, 1946 (74 y.o. Male) Treating RN: Deon Pilling Primary Care Tirsa Gail: [name] Other Clinician: Referring Mekisha Bittel: Treating Payten Beaumier/Extender:[name], [name], [name] in Treatment: 26 Active Problems Location of Pain Severity and Description of Pain Patient Has Paino No Site Locations Rate the pain. Current Pain Level: 0 Pain Management and Medication Current Pain Management: Medication: No Cold Application: No Rest: No Massage: No Activity: No T.E.N.S.: No Heat Application: No Leg drop or elevation: No Is the Current Pain Management Adequate: Inadequate How does your wound impact your activities of daily livingo Sleep: No Bathing: No Appetite: No Relationship With Others: No Bladder Continence: No Emotions: No Bowel Continence: No Work: No Toileting: No Drive: No Dressing: No Hobbies: No Electronic Signature(s) Signed: 01/28/2020 5:08:19 PM By: Deon Pilling Entered By: Deon Pilling on 01/27/2020 13:04:06 -------------------------------------------------------------------------------- Patient/Caregiver Education Details Patient Name: [name] 3/30/2021andnbsp12:30 Date of Service: PM Medical Record [id number] Number: Patient Account Number: [id number] Treating RN: 08-26-1946 (74 y.o. [name] Date of Birth/Gender: Male) Other Clinician: Primary Care Treating Jackquline Denmark Physician: Physician/Extender: Referring Physician: Darolyn Rua in Treatment: 26 Education Assessment Education Provided To: Patient Education Topics Provided Wound/Skin Impairment: Methods: Explain/Verbal Responses: State content  correctly Electronic Signature(s) Signed: 01/28/2020 5:40:01 PM By: [name] RN Entered By: [name] on 01/27/2020 13:10:48 -------------------------------------------------------------------------------- Wound Assessment Details Patient Name: Date of Service: XERXES, AGRUSA 01/27/2020 12:30 PM Medical Record [id number] Patient Account Number: [id number] Date of Birth/Sex: 02-20-1946 (73 y.o. Male) Treating RN: Shawn Stall Primary Care Zennie Ayars: [name] Other Clinician: Referring Voshon Petro: Treating Johnni Wunschel/Extender:[name], [name], [name] in Treatment: 26 Wound Status Wound Number: 5 Primary Diabetic Wound/Ulcer of the Lower Extremity Etiology: Wound Location: Left Metatarsal head fifth Wound Open Wounding Event: Gradually Appeared Status: Date Acquired: 07/14/2019 Comorbid Cataracts, Chronic sinus problems/congestion, Weeks Of Treatment: 26 History: Coronary Artery Disease, Hypertension, Clustered Wound: No Peripheral Arterial Disease, Type II Diabetes, Osteoarthritis, Neuropathy Photos Photo Uploaded By: Benjaman Kindler on 01/30/2020 15:45:48 Wound Measurements Length: (cm) 1.2 Width: (cm) 1 Depth: (cm) 0.6 Area: (cm) 0.942 Volume: (cm) 0.565 Wound Description Classification: Grade 2 Wound Margin: Well defined, not attached Exudate Amount: Medium Exudate Type: Serous Exudate Color: amber Wound Bed Granulation Amount: Small (1-33%) Granulation Quality: Pink, Pale Necrotic Amount: Large (67-100%) Necrotic Quality: Adherent Slough Assessment Notes maceration present to periwound. Treatment Notes Wound #5 (Left Metatarsal head fifth) 1. Cleanse With Wound Cleanser Soap and water 2. Periwound Care Skin Prep 3. Primary Dressing Applied Santyl 4.  Secondary Dressing Foam Border Dressing fter Cleansing: No ino Yes Exposed Structure sed: No Subcutaneous Tissue) Exposed: Yes sed: No sed: No ed: No d: No % Reduction in Area: -1395.2% % Reduction in Volume: -4246.2% Epithelialization: None Tunneling: No Undermining: No Foul Odor A Slough/Fibr Fascia Expo Fat Layer ( Tendon Expo Muscle Expo Joint Expos Bone Expose Electronic Signature(s) Signed: 01/28/2020 5:08:19 PM By: Shawn Stall Entered By: Shawn Stall on 01/27/2020 13:04:57 -------------------------------------------------------------------------------- Vitals Details Patient Name: Date of Service: ANACLETO, BATTERMAN 01/27/2020 12:30 PM Medical Record [id number] Patient Account Number: [id number] Date of Birth/Sex: 05-Mar-1946 (74 y.o. Male) Treating RN: Shawn Stall Primary Care Michal Callicott: [name] Other Clinician: Referring Jourdain Guay: Treating Cambria Osten/Extender:[name], [name], [name] in Treatment: 26 Vital Signs Time Taken: 12:47 Temperature (F): 98.4 Height (in): 69 Pulse (bpm): 81 Weight (lbs): 210 Respiratory Rate (breaths/min): 18 Body Mass Index (BMI): 31 Blood Pressure (mmHg): 118/70 Capillary Blood Glucose (mg/dl): 443 Reference Range: 80 - 120 mg / dl Electronic Signature(s) Signed: 01/28/2020 5:08:19 PM By: Shawn Stall Entered By: Shawn Stall on 01/27/2020 13:03:54

## 2020-02-02 NOTE — Progress Notes (Signed)
NIKASH, MORTENSEN ([id number]) Visit Report for 01/27/2020 Debridement Details Patient Name: Date of Service: [name], MACLEOD 01/27/2020 12:30 PM Medical Record [id number] Patient Account Number: [id number] Date of Birth/Sex: [birth year] (74 y.o. M) Treating RN: [name] Primary Care Provider: [name] Other Clinician: Referring Provider: Treating Provider/Extender:Camelia Stelzner, [name], [name] in Treatment: 26 Debridement Performed for Wound #5 Left Metatarsal head fifth Assessment: Performed By: Physician Ricard Dillon., MD Debridement Type: Debridement Severity of Tissue Pre Fat layer exposed Debridement: Level of Consciousness (Pre- Awake and Alert procedure): Pre-procedure Verification/Time Out Taken: Yes - 13:15 Start Time: 13:15 Pain Control: Lidocaine 5% topical ointment Total Area Debrided (L x W): 1.2 (cm) x 1 (cm) = 1.2 (cm) Tissue and other material Callus, Slough, Subcutaneous, Skin: Dermis , Skin: Epidermis, Slough debrided: Level: Skin/Subcutaneous Tissue Debridement Description: Excisional Instrument: Curette Bleeding: Moderate Hemostasis Achieved: Pressure End Time: 13:18 Procedural Pain: 0 Post Procedural Pain: 0 Response to Treatment: Procedure was tolerated well Level of Consciousness Awake and Alert (Post-procedure): Post Debridement Measurements of Total Wound Length: (cm) 1.2 Width: (cm) 1 Depth: (cm) 0.6 Volume: (cm) 0.565 Character of Wound/Ulcer Post Improved Debridement: Severity of Tissue Post Debridement: Fat layer exposed Post Procedure Diagnosis Same as Pre-procedure Electronic Signature(s) Signed: 01/28/2020 5:40:01 PM By: [name] RN Signed: 02/02/2020 7:44:34 AM By: [name] MD Entered By: [name] on 01/27/2020 13:30:23 -------------------------------------------------------------------------------- HPI Details Patient Name: Date of Service: AHNAF, [name] 01/27/2020 12:30 PM Medical Record  [id number] Patient Account Number: [id number] Date of Birth/Sex: Treating RN: September 16, 1946 (73 y.o. [name] Primary Care Provider: [name] Other Clinician: Referring Provider: Treating Provider/Extender:Srishti Strnad, [name], [name] in Treatment: 26 History of Present Illness HPI Description: 05/18/16; this is a 74year-old diabetic who is a type II diabetic on insulin. The history is that he traumatized his right foot developed a sore sometime in late March. 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 According to the patient and his wife this wound never really healed. He was seen on 2 occasions in the wound care center in [place] and had vascular studies and then was referred urgently to Dr. Bridgett Larsson of vascular surgery. He underwent an angiogram on 05/10/16. Unfortunately nothing really could be done to improve his vascular status. He had a 75-90% stenosis in the midsegment of 1 segment of the posterior femoral artery. He had a patent popliteal, his anterior tibial occluded shortly after takeoff. Perineal had a greater than 90% stenosis posterior tibial is occluded feet had no distal collaterals feed distal aspect of the transmetatarsal amputation site. The patient tells me that he had a prolonged period of Santyl by Dr. Doran Durand was some initial improvement but then this was stopped. I think they're only applying daily dressings/dry dressings. He has not had a recent x-ray of the right foot he did have one before his surgery in April. His wife by the dimensions of the wound/surgical site being followed at home since 4/20. At that point the dimensions were 0.5 x 12 x 0.2 on 7/19 this was 1.8 x 6 x 0.4. He is not currently on any antibiotics. His  hemoglobin A1c in early April was 12.9 at that point he was started on insulin. Apparently his blood sugars are much lower he has an appointment with  Dr. Legrand Como Alteimer of endocrine next week. 05/29/16 x-ray of the area did not show osteomyelitis. I think he probably needs an MRI at this point. His wife is asking about something called"Yireh" cream which is not FDA approved. I have not heard of this. 06/22/16; MRI did not really suggest osteomyelitis. There was minimal marrow edema and enhancement in the stump of the second metatarsal felt to be secondary likely to postoperative change rather than osteomyelitis. The patient has arranged his own consultation with Dr. Andree Elk at [facility], apparently their daughter lives in [place] and has some connection here. Any improvement in vascular supply by Dr. Andree Elk would of course be helpful. Dr. Bridgett Larsson did not feel that anything further could be done other than amputation if wound care did not result in healing or if the area deteriorates. 06/26/16; the patient has been to see Dr. Andree Elk at [place] and had an angiogram. He is going for a procedure on Thursday which will involve catheterization. I'm not sure if this is an anterograde or retrograde approach. He has been using Santyl to the wound 07/10/16; the patient had a repeat angiogram and angioplasty at [facility] by Dr. Brunetta Jeans. His angiogram showed right CFA and profundal widely patent. The right as of a.m. popliteal artery were widely patent the right anterior tibial was occluded proximally and reconstitutes at the ankle. Peroneal artery was patent to the foot. Posterior tibial artery was occluded. The patient had angioplasty of the anterior tibial artery.. This was quite successful. He was recommended for Plavix as well as aspirin. 07/17/16; the patient was close to be a nurse visit today however the outer dressing of the Apligraf fell off. Noted drainage. I was asked to see the wound. The patient is noted an odor  however his wife had noted that. Drainage with Apligraf not necessarily a bad thing. He has not been systemically unwell 07/24/16; we are still have an issue with drainage of this wound. In spite of this I applied his second Apligraf. Medially the area still is probing to bone. 08/07/16; Apligraf reapplied in general wound looks improved. 08/21/16 Apligraf #4. Wound looks much better 09/04/16 patientt's wound again today continues to appear to improve with the application of the Apligraf's. He notes no increased discomfort or concerns at this point in time. 09/18/16; the patient returns today 2 weeks after his fifth application of Apligraf. Predictably three quarters of the width of this wound has healed. The deep area that probe to bone medially is still open. The patient asked how much out-of-pocket dollars would be for additional Apligraf's. 09/25/16; now using Hydrofera Blue. He has completed 5 Apligraf applications with considerable improvement in this deep open transmetatarsal amputation site. His wound is now a triangular-shaped wound on the medial aspect. At roughly 12 to 2:00 this probes another centimeter but as opposed to in the past this does not probe to bone. The patient has been seen at [facility] by Dr. Zenia Resides. He is not planning to do any more revascularization unless the wound stalls or worsens per the patient 10/02/16; 0.7 x 0.8 x 0.8. Unfortunately although the wound looks stable to improved. There is now easily probable bone. This hasn't been present for several weeks. Patient is not otherwise symptomatic he is not experiencing any pain. I did a culture of the wound bed 10/09/16. Deterioration last week. Culture grew MRSA and although there is improvement here with doxycycline prescribed over the phone I'm going to try to get him linezolid 600 twice a day  for 10 days today. 10/16/16; he is completing a weeks worth of linezolid and still has 3 more days to go. Small triangular-shaped  open area with some degree of undermining. There is still palpable bone with a curet. Overall the area appears better than last week 10/20/16 he has completed the linezolid still having some nausea and vomiting but no diarrhea. He has exposed bone this week which is a deterioration. 10/27/16 patient now has a small but probing wound down to bone. Culture of this bone that I did last week showed a few methicillin-resistant staph aureus. I have little doubt that this represents acute/subacute osteomyelitis. The patient is currently on Doxy which I will continue he also completed 10 days of linezolid. We are now in a difficult situation with this patient's foot after considerable discussion we will send him back to see Dr. Doran Durand for a surgical opinion of this I'm also going to try to arrange a infectious disease consult at [facility] hopefully week and get this prior to her usual 4-6 weeks we having [place]. The patient clearly is going to need 6 weeks of IV vancomycin. If we cannot arrange this expediently I'll have to consider ordering this myself through a home infusion company 11/03/16; the patient now has a small in terms of circumference but probing wound. No bone palpable today. The patient remains on doxycycline 100 twice a day which should support him until he sees infectious disease at [facility] next week the following week on Wednesday I believe he has an appointment with Dr. Andree Elk at [facility] who is his vascular cardiologist. Finally he has an appointment with Dr. Doran Durand on 11/22/16 we have been using silver alginate. The patient's wife states they are having trouble getting this through [employer] 11/13/16; the patient was seen by infectious disease at [facility] in the 11th PICC line placed in preparation for IV antibiotics. A tummy he has not going to get IV vancomycin o Ceftaroline. They've also ordered an MRI. Patient has a follow-up with Dr. Doran Durand on 11/22/16 and Dr. Andree Elk at [facility]  tomorrow 11/23/16 the patient is on daptomycin as directed by infectious disease at [facility]. He is also been back to see Dr. Andree Elk at [facility]. He underwent a repeat arteriogram. He had a successful PTA of the right anterior tibial artery. He is on dual antiplatelete treatment with Plavix and aspirin. Finally he had the MRI of his foot in [place]. This showed cellulitis about the foot worse distally edema and enhancement in the reminiscent of the second metatarsal was consistent with osteomyelitis therefore what I was assuming to be the first metatarsal may be actually the second. He also has a fluid collection deep to the calcaneus at the level of the calcaneal spur which could be an abscess or due to adventitial bursitis. He had a small tear in his Achilles 11/30/16; the patient continues on daptomycin as directed by infectious disease at [facility]. He is been revascularized by Dr. Andree Elk at [facility] in [place]. He has been to see Gretta Arab who was the orthopedic surgeon who did his original amputation. I have not seen his not however per the patient's wife he did not offer another surgical local surgical prodecure to remove involved bone. He verbalized his usual disbelief in not just hyperbarics but any medical therapy for this condition(osteomyelitis). I discussed this in detail with the patient today including answering the question about a BKA definitively "curing" the current condition. 12/07/16; the patient continues on daptomycin as directed  by infectious disease at [facility]. This is directed at the MRSA that we cultured from his bone debridement from 12/22. Lab work today shows a white count of 8.7 hemoglobin of 10.5 which is microcytic and hypochromic differential count shows a slightly elevated monocyte count at 1.2 eosinophilic count of 0.6. His creatinine is 1.11 sedimentation rate apparently is gone from 35-34 now 40. I explained was wife I don't think this represents a trend. His  total CK is 48 12/14/16- patient is here for follow-up evaluation of his right TMA site. He continues to receive IV daptomycin per infectious disease. His serum inflammatory markers remain elevated. He complains of intermittent pain to the medial aspect of the TMA site with intermittent erythema. He voices no complaints or concerns regarding hyperbaric therapy. Overall he and his wife are expressing a frustration and discouragement regarrding the length of time of treatment. 12/21/16; small open wound at roughly the first or second metatarsal metatarsalphalyngeal joint reminiscence of his transmetatarsal amputation site he continues to receive IV daptomycin per infectious disease at [facility]. He will finish these a week tomorrow. He has lab work which I been copied on. His white count is 10.8 hemoglobin 8.9 MCV is low at 75., MCH low at 24.5 platelet count slightly elevated at 626. Differential count shows 70% neutrophils 10% monocytes and 10% eosinophils. His comprehensive metabolic panel shows a slightly low sodium at 133 albumin low at 3.1 total CK is normal at 42 sedimentation rate is much higher at 82. He has had iron studies that show a serum iron of 15 and iron binding capacity of 238 and iron saturation of 6. This is suggestive of iron deficiency. B12 and folate were normal ferritin at 113 The patient tells me that he is not eating well and he has lost weight. He feels episodically nauseated. He is coughing and gagging on mucus which she thinks is sinusitis. He has an appointment with his primary doctor at 5:00 this afternoon in [facility] 01/02/17; the patient developed a subacute pneumonitis. He was admitted to [facility] after a CT scan showed an extensive interstitial pneumonitis [I have not yet seen this]. 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 He has since been discharged. He  is not on oxygen. He feels fatigued and very short of breath with exertion. At [facility] the wound care nurse there felt that his wound was healed. He did complete his daptomycin and has follow-up with infectious disease on Friday. X-rays I did before he went to [facility] still suggested residual osteomyelitis in the anterior aspect of the second must metatarsal head. I'm not sure I would've expected any different. There was no other findings. I actually think I did this because of erythema over the first metatarsal head reminiscent 01/11/17; the patient has eosinophilic pneumonitis. He has been reviewed by pulmonology and given clearance for hyperbarics at least that's what his wife says. I'll need to see if there is note in care everywhere. Apparently the prognosis for improvement of daptomycin induced eosinophilic granulocyte is is 3 months without steroids. In the meantime infectious disease has placed him on doxycycline until the wound is closed. He is still is lost a lot of weight and his blood sugars are running in the mid 60s to low 80s fasting and at all times during the day 01/18/17; he has had adjustments in his insulin apparently his blood sugars in the morning or over 100.  He wants to restart his hyperbaric treatment we'll do this at 1:00. 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 I think there is good reason to complete his treatments in order to give him the best chance of maintaining a healed status and these DFU 3 wounds with MRSA infection in the bone 02/15/17; the patient was seen today in conjunction with HBO. He completed hyperbaric oxygen today. The open area on his transmetatarsal site has remained closed. There was an area of erythema when I saw him earlier in the week on the posterior heel although that is resolved as of today as well. He has been using a cam walker. This is a patient who came  to [place] after a transmetatarsal amputation that was necrotic and dehisced. He required revascularization percutaneously on 2 different occasions by Dr. Andree Elk of invasive cardiology at [facility]. 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 He went to [facility] infectious disease and had IV daptomycin for 5 weeks before developing eosinophilic pneumonitis and requiring an admission to hospital/ICU. He made a good recovery and is continued on doxycycline since. As mentioned his foot is closed now. He has a follow-up with Dr. Andree Elk tomorrow. He is going to [employer] on Monday for a custom-made shoe READMISSION Last visit Dr. Andree Elk [facility] 02/16/17 1. Critical limb ischemia of the RLE: s/p transmetatarsal amputation of the RLE, now completely healed. s/p right ATA percutaneous revascularization procedure x2, most recently 11/20/2016 s/p PTA of right AT 100% to less than 20% with a 2.5 x 200 balloon. He does have some residual osteomyelitis, but given the wound is closed, the orthopedist has recommended to follow. He has a fitting for a special shoe coming up next week. He has completed a course of daptomycin and doxycycline and he has been discharged by ID. He has completed a course of hyperbaric therapy. Continue Continue medical management with aspirin, Plavix and statin therapy. We will discuss ongoing Plavix therapy at follow-up in 6 months. 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 Given no symptoms, we will conservatively manage. He doesn't want to do any more invasive studies at this time, which is reasonable. The patient arrives today out of 2 concerns both on the right transmetatarsal site. 1 at the level of the reminiscent fifth metatarsal head and the other at roughly  the first or second. Both of these look like dark subcutaneous discoloration probably subdermal bleeding. He is recently obtained new adaptive footwear for the right foot. He also has a callus on the left fifth dorsal toe however he follows with podiatry for this and I don't think this is any issue. ABIs in this clinic today were 0.66 on the right and 1.06 on the left. On entrance into our clinic initially this was 0.95 and 0.92. He does not describe current claudication. They're going away on a cruise in 8 weeks and I think are trying to do the month is much as they can proactively. They follow with podiatry and have an appointment with Dr. Andree Elk in October READMISSION 06/25/18 This is a patient that we have not seen in almost a year. He is a type II diabetic with known PAD. He is followed by Dr. Andree Elk of interventional cardiology at [facility]  in [place]. He is required revascularization for significant PAD. When we first saw him he required a transmetatarsal amputation. He had underlying osteomyelitis with a nonhealing surgical wound. This eventually closed with wound care, IV antibiotics and hyperbaric oxygen. They tell me that he has a modified shoe and he is very active walking up to 4 miles a day. He is followed by Dr. Geroge Baseman of podiatry. His wife states that he underwent a removal of callus over this site on July 9. She felt there may be drainage from this site after that although she could never really determined and there was callus buildup again. On 06/01/18 there was pressure bleeding through the overlying callus. he was given a prescription for 7 days of Bactrim. Fortuitously he has an appointment with Dr. Andree Elk on 06/04/18 and he immediately underwent revascularization of the right leg although I have not had a chance to review these records in care everywhere. This was apparently done through anterior and retrograde access. On 06/06/18 he had another debridement by Dr. Bernette Mayers. Vitamin  soaking with Epsom salts for 20 minutes and applying calcium alginate. The original trans-met was on April 2017 I believe by Dr. Doran Durand. His ABI in our clinic was noncompressible today. 07/02/18; x-ray I ordered last week was negative for osteomyelitis. Swab culture was also negative. He is going to require an MRI which I have ordered today. 07/09/18; surprisingly the MRI of the foot that I ordered did not show osteomyelitis. He did suggest the possibility of cellulitis. For this reason I'll go ahead and give him a 10 day course of doxycycline. Although the previous culture of this area was negative 07/16/18; it arrives with the wound looking much the same. Roughly the same depth. He has thick subcutaneous tissue around the wound orifice but this still has roughly the same depth. Been using silver alginate. We applied Oasis #1 today 07/23/2018; still having to remove a lot of callus and thick subcutaneous tissue to actually define the wound here. Most of this seems to have closed down yet he has a comma shaped divot over the top of the area that still I think is open. We applied Oasis #2 His wife expressed concern about the tip of his left great toe. This almost looks like a small blister. She also showed it today to her podiatrist Dr. Geroge Baseman who did not think this was anything serious. I am not sure is anything serious either however I think it bears some watching. He is not in any pain however he is insensate 07/30/2018;; still on a lot of nonviable tissue over the surface of the wound however cleaning this up reveals a more substantial wound orifice but less of a probing wound depth. This is not probed to bone. There is no evidence of infection The wife is still concerned about a non-open area on the tip of his left great toe. Almost feels like a bony outgrowth. She had previously showed this to podiatry. I do not think this is a blister. A friction area would be possible although he is really not  walking according to his wife. He had a small skin tag on his right buttock but no open wound here either 08/06/2018; we applied a third Oasis last week. Unfortunately there is really no improvement. Still requiring extensive debridement to expose the wound bed from a horizontal slitlike depression. I still have not been able to get this to fill in properly. On the positive side there is now no probable bone  from when he first came into the facility. I changed him to silver alginate today after a reasonably aggressive debridement 08/13/2018; once again the patient comes in with skin and subcutaneous tissue closing over the small probing area with the underlying cavity of the wound on the right TMA site. I applied silver alginate to this last week. Prior to that we used Oasis x3 still not able to get this area granulating. He does not have a probing area of the bone which is an improvement from when I spur started working on this and an MRI did not suggest osteomyelitis. I do not see evidence of infection here but I am increasingly concerned about why I cannot get this area to granulate. Each time I debrided this is looks like this is simply a matter of getting granulation to fill in the hole and then getting epithelialization. This does not seem to happen Also I sent him back to see podiatry Dr. Earleen Newport about the what felt to be bony outgrowth on the tip of his left great toe. Apparently after heel he left the clinic last week or the next day he developed a blood blister. He did see Dr. Earleen Newport. He went on to have a debridement of the medial nail cuticle he now has an open area here as well as some denuded skin. They did an x-ray apparently does have a bony outgrowth or spur but I am not able to look at this. They are using topical antibiotics apparently there was some suggested he use Santyl. Patient's wife was anxious for my opinion of this 08/20/2018; we are able to keep the wound open this time  instead of the thick subcutaneous tissue closing over the top of it however unfortunately once again this probes to bone. I do not see any evidence of infection and previous MRI did not show osteomyelitis. I elected to go back to the Oasis to see if we can stimulate some granulation. Last saw his vascular interventional cardiologist Dr. Andree Elk at the beginning of August and he had a repeat procedure. Nevertheless I wonder how much blood flow he has down to this area With regards to the left first toe he is seeing Dr. Earleen Newport next week. They are applying Bactroban to this area. 08/27/2018 Once again he comes in with thick eschar and subcutaneous tissue over the top of the small probing hole. This does not appear to go down to bone but it still has roughly the same depth. I reapplied Oasis today Over the left great toe there appears to be more of the wound at the tip of his toe than there was last week. This has an eschar on the surface of it. Will change to Santyl. There is seeing podiatry this afternoon 09/03/2018 He comes in today with the area on the transmetatarsal's site with a fair amount of callus, nonviable tissue over the circumference but it was not closed. I removed all of this as well as some subcutaneous debris and reapplied Oasis. There is no exposed bone The area over the tip of the left great toe started off as a nodule of uncertain etiology. He has been followed with podiatry. They have been removing part of the medial nail bed. He has nonviable tissue over the wound he been using Santyl in this area 09/10/18 Unfortunately comes in with neither wound area looking improved. The transmetatarsal amputation site once Again has nonviable debris over the surface requiring debridement. Unfortunately underneath this there is nothing that looks viable  and this once again goes right down to bone. There is no purulent drainage and no erythema. Also the surgical wound from podiatry on the left  first toe has an ischemic-looking eschar over the surface of the tip of the toe I have elected not to attempt candidly debride this His wife as arranged for him to have follow-up noninvasive studies in [place] under the care of Dr. Andree Elk clinic. The question is he has known severe PAD. They had recently seen him in August. He has had revascularizations in both legs within the last 4 or 5 months. He is not complaining of pain and I cannot really get a history of claudication. He has not systemically unwell 09/20/2018 the patient has been to [facility] and been revascularized by Dr. Andree Elk earlier this week. Apparently he was able to open up the anterior tibial artery although I have not actually seen his formal report. He is going for an attempt to revascularize on the left on Monday. He is apparently working with an investigational stent for lower extremity arteries below the knee and he has talked to the patient about placing that on Monday if possible. The patient has been using silver alginate on the transmetatarsal amputation site and Santyl on the left 09/30/2018; patient had his revascularization on the left this apparently included a standard approach as well as a more distal arterial catheterization although I do not have any information on this from Dr. Andree Elk. In fact I do not even see the initial revascularization that he had on the right. I have included the arterial history from Dr. Andree Elk last note however below; ASSESSMENT/PLAN: 1. Hx of Critical limb ischemia bilateral lower extremities, PAD: -s/p transmetatarsal amputation of the RLE. -s/p right ATA percutaneous revascularization procedure x2, most recently 11/20/2016 s/p PTA of right AT 100% to less than 20% with a 2.5 x 200 balloon. 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 -03/21/2018 s/p PTA of  90% left popliteal artery to <10% with a 5x20 cutting balloon, PTA of 90% left peroneal to <20% with a 3x20 balloon, PTA of 100% left AT to <20% with a 2.5x220 balloon. 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 Will plan on the LLE soon after. Risks/benefits of procedure have been discussed and patient has elected to proceed. We have been using endoform to the right TMA amputation site wound and Santyl to the left great toe 10/07/2018; the area on the tip of his left great toe looked better we have been using Santyl here. We continue to have a very difficult probing hole on the right TMA amputation site we have been using endoform. I went on to use his fifth Oasis today 10/14/2018; the tip of the left great toe continues to look better. We have been using Santyl here the surface however is healthy and I think we can change to an alginate. The right TMA has not changed. Once again he has no superficial opening there is callus and thick subcutaneous tissue over the orifice once you remove this there is the probing area that we have been dealing with without too much change. There is no palpable bone I have been placing Oasis here and put Oasis #6 in this today after a more  vigorous debridement 10/21/18; the left great toe still has necrotic surface requiring debridement. The right TMA site hasn't changed in view of the thick callus over the wound bed. With removal of this there is still the opening however this does not appear to have the same depth. Again there is no palpable bone. Oasis was replaced 10/28/2018; patient comes in with both wounds looking worse. The area over the first toe tip is now down to bone. The area over the TMA site is deeper and down to bone clearly with a increase in overall wound area. Equally concerning on the right TMA is the  complete absence of a pulse this week which is a change. We are not even able to Doppler this. On the right he has a noncompressible ABI greater than 1.4 11/04/2018. Both wounds look somewhat worse. X-rays showed no osteomyelitis of the right foot but on the left there was underlying osteomyelitis in the left great toe distal phalanx. I been on the phone to Dr. Andree Elk surface at [facility] in [place] and they are arranging for another angiogram on the right on Monday. I have him on doxycycline for the osteomyelitis in the left great toe for now. Infectious disease may be necessary 1/17; 2-week hiatus. Patient was admitted to hospital at [place]. My understanding is he underwent an angioplasty of the right anterior tibial artery and had stents placed in the left anterior artery and the left tibial peroneal trunk. This was done by Dr. Andree Elk of interventional radiology. There is no major change in either 1 of the wounds. They have been using Aquacel Ag. As far as they are aware no imaging studies were done of the foot which is indeed unfortunate. I had him on doxycycline for 2 weeks since we identified the osteomyelitis in the left great toe by plain x-ray. I have renewed that again today. I am still suspicious about osteomyelitis in the amputation site and would consider doing another MRI to compare with the one done in September. The idea of hyperbaric oxygen certainly comes up for discussion 1/24; no major change in either wound area. I have him on doxycycline for osteomyelitis at the tip of the left great toe. As noted he has been previously and recently revascularized by Dr. Andree Elk at [place]. He is tolerating the doxycycline well. For some reason we do not have an infectious disease consult yet. Culture of drainage from the right foot site last week was negative 1/31; MRI of the right foot did not show osteomyelitis of the right ankle and foot. Notable for a skin ulceration overlying the second  metatarsal stump with generalizing soft tissue edema of the ankle and foot consistent with cellulitis. Noted to have a partial-thickness tear of the Achilles tendon 7.5 cm proximal to the insertion Nothing really new in terms of symptoms. Patient's area on the tip of the left great toe is just about closed although he still has the probing area on the metatarsal amputation site. Appointment with Dr. Linus Salmons of infectious disease next week 2/7; Dr. Novella Olive did not feel that any further antibiotics were necessary he would follow-up in 2 months. The left great toe appears to be closed still some surface callus that I gently looked under high did not see anything open or anything that was threatening to be open. He still has the open area on the mid part of his TMA site using endoform 2/14; left great toe is closed and he is completing his doxycycline as of last  Sunday. He is not on any antibiotics. Unfortunately out of the right foot his wife noticed some subdermal hemorrhage this week. He had been walking 2 miles I had given him permission to do so this is not really a plantar wound. Using endoform to this wound but I changed to silver alginate this week 2/21; left great toe remains closed. Culture last week grew Streptococcus angiosis which I am not really familiar with however it is penicillin sensitive and I am going to put him on Augmentin. Not much change in the wound on the right foot the deep area is still probing precariously close to bone and the wound on the margin of the TMA is larger. 2/28; left great toe remains closed. He is completing the Augmentin I gave him last week. 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 This is being shown to Dr. Andree Elk at [place] to see if there is anything else that can be done here. He has been using silver alginate strips on the right 3/6; left great toe remains closed. He sees Dr. Andree Elk on Monday. The area on the plantar  aspect of the right foot has the same small orifice with thick callused tissue around this. However this time with removal of the callus tissue the wound is open all the way along the incision line to the end medially. We have been using silver alginate. 3/13; left toe remains closed although the area is callused. He sees Dr. Jacqualyn Posey of podiatry next week. The area on the plantar right foot looked a lot better this week. Culture I did of this was negative we use silver alginate. He is going next week for an attempt at revascularization by Dr. Andree Elk 3/23; left toe remains closed although the area is callused. He will see Dr. Jacqualyn Posey in follow-up. The area on the right plantar foot continues to look surprisingly better over the last 3 visits. We have been using silver alginate. The revascularization he was supposed to have by Dr. Andree Elk at [facility] in [place] has been canceled [facility] procedure] 4/6; the right foot remains closed albeit callused. Podiatry canceled the appointment with regards to the left great toe. He has not seen Dr. Andree Elk at [facility] but thinks that Dr. Andree Elk has "done all he can do". He would be a candidate for the hemostaemix trial Readmission 07/29/2019 Mr. Stann Mainland is a man we know well from at least 3 previous stays in this clinic. He is a type II diabetic with severe PAD followed by Dr. Andree Elk at [facility] in [place]. 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 So far everything in both of these areas has remained closed. About 2 weeks ago he went to see his podiatrist at [facility] Dr. Babs Bertin. He had a thick callus on the left fifth metatarsal head that was shaved. He has developed an open wound in this area. They have been offloading this in his diabetic shoes. The patient has not had any more revascularizations by Dr. Andree Elk since the last  time he was here. ABI in our clinic at the posterior tibial on the left was 1.06 10/6; no real change in the area on the plantar met head. Small wound with 2 mm of depth. He has thick skin probably from pressure around the wound. We have been using silver  alginate 10/13; small wound in the left fifth plantar met head. Arrives today with undermining laterally and purulent drainage. Our intake nurse cultured this. His wife stated they noticed a change in color over the last day or 2. He is not systemically unwell 10/19; small wound on the fifth plantar metatarsal head. Culture I did last week showed Staphylococcus lugdunensis. Although this could be a skin contaminant the possibility of a skin and soft tissue infection was there. I did give him empiric doxycycline which should have covered this. We are using silver alginate to the wound. We put him in a total contact cast today. 10/22; small wound on the fifth plantar metatarsal head. He has completed antibiotics. He also has severe PAD which worries me about just about any wound on this man's foot 10/29; small superficial area on the fifth plantar metatarsal head. Measuring slightly smaller. He is not currently on any antibiotics. I been using a total contact cast in this man with very severe PAD but he seems to be tolerating this well 11/5; left plantar fifth metatarsal head. Silver alginate being used under a total contact cast 11/12; wound not much different than last week. Using a #15 scalpel debridement around the wound. Still silver collagen under a total contact cast. He has severe PAD and that may be playing a role in this 11/19; disappointing that the wound is not really changed that much. I think it is come down in overall surface area because originally this was on the lateral part of the fifth metatarsal head however recently it is not really changed. Most of this is filled in but it will not epithelialized there is surface debris on  this which may be mostly related to ischemia. They have an appointment with Dr. Andree Elk on 12/9 09/24/2019 on evaluation today patient appears to be doing somewhat better with regard to the wound on the left fifth metatarsal head. Fortunately there does not appear to be any signs of active infection at this time. No fevers, chills, nausea, vomiting, or diarrhea. The wound does not appear to be completely closed and I do feel like the Hydrofera Blue is helping to some degree although I feel like the cast as well as keeping things from breaking down or getting any larger at least. As far as his wound overview it shows that the overall size of the wound is slightly smaller but really maintaining within the realm of about the same. He had no troubles with the cast which is good news. 12/1; left fifth metatarsal head. Perhaps somewhat more vibrant. We have been using Hydrofera Blue. He sees Dr. Andree Elk at [place] 1 week tomorrow. He be back next week and I hope to have a better idea which way the wound is going however I will not cast him next week in case Dr. Andree Elk wants to reevaluate things in his foot. The left leg is the leg with the stent in place and I wonder whether these are going to have to be reevaluated. 12/8; left fifth metatarsal head. Quite a bit better this week. Only a small open area remains. He sees Dr. Andree Elk at [place] tomorrow. Dr. Andree Elk is previously done his vascular interventions. He has 2 stents in the left leg as I remember things. I therefore will not put him in a total contact cast today. He will be using a forefoot off loader. We will use silver collagen on the wound. We will see him again next week 12/15; left fifth metatarsal head.  He saw Dr. Andree Elk and is scheduled for an angiogram on Thursday. Unfortunately although his wound was a lot better last week it is really deteriorated this week. Small punched-out hole with depth and overhanging tissue. We have been using Hydrofera  Blue 12/22; patient had an 80% stenosis proximal to the stent I believe in the below-knee popliteal artery. This was opened by Dr. Andree Elk. According the patient the stent itself was satisfactory. I have not been able to review this note. 12/29; patient arrives today with the wound about the same size some undermining medially. We have been using Hydrofera Blue under a total contact cast and that is what we will do again today 11/04/2019. Slightly smaller in orifice but about 4 mm undermining almost circumferentially. We have been using Hydrofera Blue. Apparently the Hydrofera Blue did not stay on the wound after we removed the cast. Some minor looking cast irritation on the right lateral 1/12; unfortunately things did not go well this week. Arrives in clinic today with a deeper wound with undermining more concerning a area of pus. Specimen was obtained for culture. We are not going to be able to put him in a cast today. 1/19; purulent material last week which I cultured showed Enterobacter cloacae. He was on ampicillin previously prescribed by his primary doctor which should not have covered this however mysteriously the wound looks somewhat better. There is less depth less erythema certainly no drainage. He will need a course of ciprofloxacin which I will provide today. 1/26; he has completed the ciprofloxacin. I don't think he needs any additional antibiotics. The areas on the left fifth plantar met head. We've been using silver alginate 2/2; comes in today with 0.4 cm of circumferential undermining around a small wound in this area. We have been using silver alginate with a forefoot offloading boot 2/9; again the wound appears larger nonviable surface and with 0.4 cm roughly of circumferential undermining. We have been using silver alginate with a forefoot offloading boot. The wound does not look infected however his wife is quick to point out about swelling on the dorsal foot. 2/16; not much  change. Comes in with a small wound with a nonviable necrotic surface. Again marked undermining that I had totally removed last week. He had a arterial Doppler last week in Dr. Andree Elk office at [facility]. They have not heard these results. They are somewhat frustrated. 2/23; if anything this is worse. Undermining increased depth. Nonviable surface that looks pale. According his wife is TBI on the right was 0.22 although I have not verified this. He is going for an angiogram with Dr. Andree Elk next Monday. We are using polymen and offloading in a forefoot offloading boot. 3/2; the patient was revascularized yesterday by [facility]. He had successful PTA of the left peroneal 60% to less than 20% and successful PTA of the left ATA 100% to less than 20%. He now has a pulse in the left foot Now that he has some additional blood flow there is not a lot of options here. On one hand we want activity to help keep blood flow augmented on the other hand pressure relief is going to be paramount if we have any chance to get this to close. After some discussion with his wife and patient I went ahead and put him in a total contact cast. 3/9; he arrives in clinic today with some debris over the wound surface. We use silver collagen under a total contact cast after his  revascularization by Dr. Andree Elk 3/16; wound about the same depth at 0.5 cm. Surface area perhaps slightly less but it is the depth the really is important. We have been using silver collagen 3/23; not much change here. There is still undermining medially the depth is about the same tissue looks somewhat better. We switched to Iodoflex last week to try to get a better looking wound surface. He does have a small abrasion over the proximal interphalangeal joint dorsally. I this is so small it is difficult even to tell whether it is open. Some abrasion between the fifth toe and the webspace as well 3/30; if anything the wound is deeper this  week and larger. Very disappointing. Under illumination debris on the surface which I debrided gently with a #3 curette there is minimal bleeding. We had been using iodoflex under a total contact cast. I am going to take him out of the cast and change him to [place] today which his wife will change daily. Electronic Signature(s) Signed: 02/02/2020 7:44:34 AM By: [name] MD Entered By: [name] on 01/27/2020 13:33:55 -------------------------------------------------------------------------------- Physical Exam Details Patient Name: Date of Service: [name], [name] 01/27/2020 12:30 PM Medical Record [id number] Patient Account Number: [id number] Date of Birth/Sex: Treating RN: 06-28-1946 (73 y.o. [name] Primary Care Provider: [name] Other Clinician: Referring Provider: Treating Provider/Extender:Jaece Ducharme, [name], [name] in Treatment: 26 Constitutional Sitting or standing Blood Pressure is within target range for patient.. Pulse regular and within target range for patient.Marland Kitchen Respirations regular, non-labored and within target range.. Temperature is normal and within the target range for the patient.Marland Kitchen Appears in no distress. Notes Wound exam; no improvement of anything larger in all dimensions. Light surface debridement very fibrinous gritty surface. He does not probe to bone but he is not that far off. No surrounding infection is obvious Electronic Signature(s) Signed: 02/02/2020 7:44:34 AM By: [name] MD Entered By: [name] on 01/27/2020 13:32:18 -------------------------------------------------------------------------------- Physician Orders Details Patient Name: Date of Service: EVERETTE, MALL 01/27/2020 12:30 PM Medical Record [id number] Patient Account Number: [id number] Date of Birth/Sex: Treating RN: [birth year] (73 y.o. [name]) [name] Primary Care Provider: [name] Other Clinician: Referring Provider:  Treating Provider/Extender:Ole Lafon, [name], [name] in Treatment: 26 Verbal / Phone Orders: No Diagnosis Coding ICD-10 Coding Code Description E11.621 Type 2 diabetes mellitus with foot ulcer E11.51 Type 2 diabetes mellitus with diabetic peripheral angiopathy without gangrene L97.521 Non-pressure chronic ulcer of other part of left foot limited to breakdown of skin L03.116 Cellulitis of left lower limb Follow-up Appointments Return Appointment in 1 week. Dressing Change Frequency Wound #5 Left Metatarsal head fifth Change dressing every day. Wound Cleansing Wound #5 Left Metatarsal head fifth May shower and wash wound with soap and water. Primary Wound Dressing Wound #5 Left Metatarsal head fifth Santyl Ointment Other: - pad right great toe for protection Secondary Dressing Dry Gauze Foam Border Radiology X-ray, foot, left - non healing wound - (ICD10 E11.621 - Type 2 diabetes mellitus with foot ulcer) Electronic Signature(s) Signed: 01/28/2020 5:40:01 PM By: [name] RN Signed: 02/02/2020 7:44:34 AM By: [name] MD Entered By: [name] on 01/27/2020 13:26:31 -------------------------------------------------------------------------------- Prescription 01/27/2020 Patient Name: [name] Provider: [name] MD Date of Birth: 02-06-1946 NPI#: [id number] Sex: M DEA#: [id number] Phone #: [phone] License #: [id number] Patient Address: [address] [place], [place] [address] [place], [place] [address] [phone] Allergies daptomycin Reaction: pneumonia Severity: Severe  Provider's Orders X-ray, foot, left - ICD10: E11.621 - non healing wound Signature(s): Date(s): Electronic Signature(s) Signed: 01/28/2020 5:40:01 PM By: [name] RN Signed: 02/02/2020 7:44:34 AM By: [name] MD Entered By: [name] on 01/27/2020  13:26:31 --------------------------------------------------------------------------------  Problem List Details Patient Name: Date of Service: LATAVIOUS, BITTER 01/27/2020 12:30 PM Medical Record [id number] Patient Account Number: [id number] Date of Birth/Sex: Treating RN: 1946/03/14 (73 y.o. [name] Primary Care Provider: [name] Other Clinician: Referring Provider: Treating Provider/Extender:Anahid Eskelson, [name], [name] in Treatment: 26 Active Problems ICD-10 Evaluated Encounter Code Description Active Date Today Diagnosis E11.621 Type 2 diabetes mellitus with foot ulcer 07/29/2019 No Yes E11.51 Type 2 diabetes mellitus with diabetic peripheral 07/29/2019 No Yes angiopathy without gangrene L97.521 Non-pressure chronic ulcer of other part of left foot 07/29/2019 No Yes limited to breakdown of skin Inactive Problems ICD-10 Code Description Active Date Inactive Date L03.116 Cellulitis of left lower limb 08/12/2019 08/12/2019 Resolved Problems Electronic Signature(s) Signed: 02/02/2020 7:44:34 AM By: [name] MD Entered By: [name] on 01/27/2020 13:29:50 -------------------------------------------------------------------------------- Progress Note Details Patient Name: Date of Service: MCKAY, TEGTMEYER 01/27/2020 12:30 PM Medical Record [id number] Patient Account Number: [id number] Date of Birth/Sex: Treating RN: 01-01-1946 (73 y.o. [name] Primary Care Provider: [name] Other Clinician: Referring Provider: Treating Provider/Extender:Jonnell Hentges, [name], [name] in Treatment: 26 Subjective History of Present Illness (HPI) 05/18/16; this is a 74year-old diabetic who is a type II diabetic on insulin. The history is that he traumatized his right foot developed a sore sometime in late March. 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 According to the patient and his wife this wound never really healed. He was seen on 2 occasions in the wound care center in [place] and had vascular studies and then was referred urgently to Dr. Bridgett Larsson of vascular surgery. He underwent an angiogram on 05/10/16. Unfortunately nothing really could be done to improve his vascular status. He had a 75-90% stenosis in the midsegment of 1 segment of the posterior femoral artery. He had a patent popliteal, his anterior tibial occluded shortly after takeoff. Perineal had a greater than 90% stenosis posterior tibial is occluded feet had no distal collaterals feed distal aspect of the transmetatarsal amputation site. The patient tells me that he had a prolonged period of Santyl by Dr. Doran Durand was some initial improvement but then this was stopped. I think they're only applying daily dressings/dry dressings. He has not had a recent x-ray of the right foot he did have one before his surgery in April. His wife by the dimensions of the wound/surgical site being followed at home since 4/20. At that point the dimensions were 0.5 x 12 x 0.2 on 7/19 this was 1.8 x 6 x 0.4. He is not currently on any antibiotics. His hemoglobin A1c in early April was 12.9 at that point he was started on insulin. Apparently his blood sugars are much lower he has an appointment with Dr. Legrand Como Alteimer of endocrine next week. 05/29/16 x-ray of the area did not show osteomyelitis. I think he probably needs an MRI at this point. His wife is asking about something called"Yireh" cream which is not FDA approved. I have not heard of this. 06/22/16; MRI did not really suggest osteomyelitis. There was  minimal marrow edema and enhancement in the stump of the second metatarsal felt to be secondary likely to postoperative change rather than osteomyelitis.  The patient has arranged his own consultation with Dr. Andree Elk at [place], apparently their daughter lives in [place] and has some connection here. Any improvement in vascular supply by Dr. Andree Elk would of course be helpful. Dr. Bridgett Larsson did not feel that anything further could be done other than amputation if wound care did not result in healing or if the area deteriorates. 06/26/16; the patient has been to see Dr. Andree Elk at [place] and had an angiogram. He is going for a procedure on Thursday which will involve catheterization. I'm not sure if this is an anterograde or retrograde approach. He has been using Santyl to the wound 07/10/16; the patient had a repeat angiogram and angioplasty at [facility] by Dr. Brunetta Jeans. His angiogram showed right CFA and profundal widely patent. The right as of a.m. popliteal artery were widely patent the right anterior tibial was occluded proximally and reconstitutes at the ankle. Peroneal artery was patent to the foot. Posterior tibial artery was occluded. The patient had angioplasty of the anterior tibial artery.. This was quite successful. He was recommended for Plavix as well as aspirin. 07/17/16; the patient was close to be a nurse visit today however the outer dressing of the Apligraf fell off. Noted drainage. I was asked to see the wound. The patient is noted an odor however his wife had noted that. Drainage with Apligraf not necessarily a bad thing. He has not been systemically unwell 07/24/16; we are still have an issue with drainage of this wound. In spite of this I applied his second Apligraf. Medially the area still is probing to bone. 08/07/16; Apligraf reapplied in general wound looks improved. 08/21/16 Apligraf #4. Wound looks much better 09/04/16 patientt's wound again today continues to appear to improve with the application of the Apligraf's. He notes no increased discomfort or concerns at this point in time. 09/18/16; the patient returns today 2 weeks after his fifth  application of Apligraf. Predictably three quarters of the width of this wound has healed. The deep area that probe to bone medially is still open. The patient asked how much out-of-pocket dollars would be for additional Apligraf's. 09/25/16; now using Hydrofera Blue. He has completed 5 Apligraf applications with considerable improvement in this deep open transmetatarsal amputation site. His wound is now a triangular-shaped wound on the medial aspect. At roughly 12 to 2:00 this probes another centimeter but as opposed to in the past this does not probe to bone. The patient has been seen at [place] by Dr. Zenia Resides. He is not planning to do any more revascularization unless the wound stalls or worsens per the patient 10/02/16; 0.7 x 0.8 x 0.8. Unfortunately although the wound looks stable to improved. There is now easily probable bone. This hasn't been present for several weeks. Patient is not otherwise symptomatic he is not experiencing any pain. I did a culture of the wound bed 10/09/16. Deterioration last week. Culture grew MRSA and although there is improvement here with doxycycline prescribed over the phone I'm going to try to get him linezolid 600 twice a day for 10 days today. 10/16/16; he is completing a weeks worth of linezolid and still has 3 more days to go. Small triangular-shaped open area with some degree of undermining. There is still palpable bone with a curet. Overall the area appears better than last week 10/20/16 he has completed the linezolid still  having some nausea and vomiting but no diarrhea. He has exposed bone this week which is a deterioration. 10/27/16 patient now has a small but probing wound down to bone. Culture of this bone that I did last week showed a few methicillin-resistant staph aureus. I have little doubt that this represents acute/subacute osteomyelitis. The patient is currently on Doxy which I will continue he also completed 10 days of linezolid. We are now in a  difficult situation with this patient's foot after considerable discussion we will send him back to see Dr. Doran Durand for a surgical opinion of this I'm also going to try to arrange a infectious disease consult at [facility] hopefully week and get this prior to her usual 4-6 weeks we having [place]. The patient clearly is going to need 6 weeks of IV vancomycin. If we cannot arrange this expediently I'll have to consider ordering this myself through a home infusion company 11/03/16; the patient now has a small in terms of circumference but probing wound. No bone palpable today. The patient remains on doxycycline 100 twice a day which should support him until he sees infectious disease at [facility] next week the following week on Wednesday I believe he has an appointment with Dr. Andree Elk at [facility] who is his vascular cardiologist. Finally he has an appointment with Dr. Doran Durand on 11/22/16 we have been using silver alginate. The patient's wife states they are having trouble getting this through [facility] 11/13/16; the patient was seen by infectious disease at [facility] in the 11th PICC line placed in preparation for IV antibiotics. A tummy he has not going to get IV vancomycin o Ceftaroline. They've also ordered an MRI. Patient has a follow-up with Dr. Doran Durand on 11/22/16 and Dr. Andree Elk at [facility] tomorrow 11/23/16 the patient is on daptomycin as directed by infectious disease at [facility]. He is also been back to see Dr. Andree Elk at [facility]. He underwent a repeat arteriogram. He had a successful PTA of the right anterior tibial artery. He is on dual antiplatelete treatment with Plavix and aspirin. Finally he had the MRI of his foot in [place]. This showed cellulitis about the foot worse distally edema and enhancement in the reminiscent of the second metatarsal was consistent with osteomyelitis therefore what I was assuming to be the first metatarsal may be actually the second. He also has a fluid collection  deep to the calcaneus at the level of the calcaneal spur which could be an abscess or due to adventitial bursitis. He had a small tear in his Achilles 11/30/16; the patient continues on daptomycin as directed by infectious disease at [facility]. He is been revascularized by Dr. Andree Elk at [facility] in [place]. He has been to see Gretta Arab who was the orthopedic surgeon who did his original amputation. I have not seen his not however per the patient's wife he did not offer another surgical local surgical prodecure to remove involved bone. He verbalized his usual disbelief in not just hyperbarics but any medical therapy for this condition(osteomyelitis). I discussed this in detail with the patient today including answering the question about a BKA definitively "curing" the current condition. 12/07/16; the patient continues on daptomycin as directed by infectious disease at [facility]. This is directed at the MRSA that we cultured from his bone debridement from 12/22. Lab work today shows a white count of 8.7 hemoglobin of 10.5 which is microcytic and hypochromic differential count shows a slightly elevated monocyte count at 1.2 eosinophilic count of 0.6. His creatinine  is 1.11 sedimentation rate apparently is gone from 35-34 now 40. I explained was wife I don't think this represents a trend. His total CK is 48 12/14/16- patient is here for follow-up evaluation of his right TMA site. He continues to receive IV daptomycin per infectious disease. His serum inflammatory markers remain elevated. He complains of intermittent pain to the medial aspect of the TMA site with intermittent erythema. He voices no complaints or concerns regarding hyperbaric therapy. Overall he and his wife are expressing a frustration and discouragement regarrding the length of time of treatment. 12/21/16; small open wound at roughly the first or second metatarsal metatarsalphalyngeal joint reminiscence of his transmetatarsal amputation  site he continues to receive IV daptomycin per infectious disease at [facility]. He will finish these a week tomorrow. He has lab work which I been copied on. His white count is 10.8 hemoglobin 8.9 MCV is low at 75., MCH low at 24.5 platelet count slightly elevated at 626. Differential count shows 70% neutrophils 10% monocytes and 10% eosinophils. His comprehensive metabolic panel shows a slightly low sodium at 133 albumin low at 3.1 total CK is normal at 42 sedimentation rate is much higher at 82. He has had iron studies that show a serum iron of 15 and iron binding capacity of 238 and iron saturation of 6. This is suggestive of iron deficiency. B12 and folate were normal ferritin at 113 The patient tells me that he is not eating well and he has lost weight. He feels episodically nauseated. He is coughing and gagging on mucus which she thinks is sinusitis. He has an appointment with his primary doctor at 5:00 this afternoon in [facility] 01/02/17; the patient developed a subacute pneumonitis. He was admitted to [facility] after a CT scan showed an extensive interstitial pneumonitis [I have not yet seen this]. 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 He has since been discharged. He is not on oxygen. He feels fatigued and very short of breath with exertion. At [facility] the wound care nurse there felt that his wound was healed. He did complete his daptomycin and has follow-up with infectious disease on Friday. X-rays I did before he went to [facility] still suggested residual osteomyelitis in the anterior aspect of the second must metatarsal head. I'm not sure I would've expected any different. There was no other findings. I actually think I did this because of erythema over the first metatarsal head reminiscent 01/11/17; the patient has eosinophilic pneumonitis. He has been reviewed by pulmonology  and given clearance for hyperbarics at least that's what his wife says. I'll need to see if there is note in care everywhere. Apparently the prognosis for improvement of daptomycin induced eosinophilic granulocyte is is 3 months without steroids. In the meantime infectious disease has placed him on doxycycline until the wound is closed. He is still is lost a lot of weight and his blood sugars are running in the mid 60s to low 80s fasting and at all times during the day 01/18/17; he has had adjustments in his insulin apparently his blood sugars in the morning or over 100. He wants to restart his hyperbaric treatment we'll do this at 1:00. 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 I think there is good reason to complete his treatments in order to give him the best chance of maintaining a healed status  and these DFU 3 wounds with MRSA infection in the bone 02/15/17; the patient was seen today in conjunction with HBO. He completed hyperbaric oxygen today. The open area on his transmetatarsal site has remained closed. There was an area of erythema when I saw him earlier in the week on the posterior heel although that is resolved as of today as well. He has been using a cam walker. This is a patient who came to [place] after a transmetatarsal amputation that was necrotic and dehisced. He required revascularization percutaneously on 2 different occasions by Dr. Andree Elk of invasive cardiology at [facility]. 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 He went to [facility] infectious disease and had IV daptomycin for 5 weeks before developing eosinophilic pneumonitis and requiring an admission to hospital/ICU. He made a good recovery and is continued on doxycycline since. As mentioned his foot is closed now. He has a follow-up with Dr. Andree Elk  tomorrow. He is going to [employer] on Monday for a custom-made shoe READMISSION Last visit Dr. Andree Elk [facility] 02/16/17 1. Critical limb ischemia of the RLE:  s/p transmetatarsal amputation of the RLE, now completely healed.  s/p right ATA percutaneous revascularization procedure x2, most recently 11/20/2016 s/p PTA of right AT 100% to less than 20% with a 2.5 x 200 balloon.  He does have some residual osteomyelitis, but given the wound is closed, the orthopedist has recommended to follow. He has a fitting for a special shoe coming up next week. He has completed a course of daptomycin and doxycycline and he has been discharged by ID. He has completed a course of hyperbaric therapy.  Continue Continue medical management with aspirin, Plavix and statin therapy. We will discuss ongoing Plavix therapy at follow-up in 6 months.  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 Given no symptoms, we will conservatively manage. He doesn't want to do any more invasive studies at this time, which is reasonable. The patient arrives today out of 2 concerns both on the right transmetatarsal site. 1 at the level of the reminiscent fifth metatarsal head and the other at roughly the first or second. Both of these look like dark subcutaneous discoloration probably subdermal bleeding. He is recently obtained new adaptive footwear for the right foot. He also has a callus on the left fifth dorsal toe however he follows with podiatry for this and I don't think this is any issue. ABIs in this clinic today were 0.66 on the right and 1.06 on the left. On entrance into our clinic initially this was 0.95 and 0.92. He does not describe current claudication. They're going away on a cruise in 8 weeks and I think are trying to do the month is much as they can proactively. They follow  with podiatry and have an appointment with Dr. Andree Elk in October READMISSION 06/25/18 This is a patient that we have not seen in almost a year. He is a type II diabetic with known PAD. He is followed by Dr. Andree Elk of interventional cardiology at [facility] in [place]. He is required revascularization for significant PAD. When we first saw him he required a transmetatarsal amputation. He had underlying osteomyelitis with a nonhealing surgical wound. This eventually closed with wound care, IV antibiotics and hyperbaric oxygen. They tell me that he has a modified shoe  and he is very active walking up to 4 miles a day. He is followed by Dr. Geroge Baseman of podiatry. His wife states that he underwent a removal of callus over this site on July 9. She felt there may be drainage from this site after that although she could never really determined and there was callus buildup again. On 06/01/18 there was pressure bleeding through the overlying callus. he was given a prescription for 7 days of Bactrim. Fortuitously he has an appointment with Dr. Andree Elk on 06/04/18 and he immediately underwent revascularization of the right leg although I have not had a chance to review these records in care everywhere. This was apparently done through anterior and retrograde access. On 06/06/18 he had another debridement by Dr. Bernette Mayers. Vitamin soaking with Epsom salts for 20 minutes and applying calcium alginate. The original trans-met was on April 2017 I believe by Dr. Doran Durand. His ABI in our clinic was noncompressible today. 07/02/18; x-ray I ordered last week was negative for osteomyelitis. Swab culture was also negative. He is going to require an MRI which I have ordered today. 07/09/18; surprisingly the MRI of the foot that I ordered did not show osteomyelitis. He did suggest the possibility of cellulitis. For this reason I'll go ahead and give him a 10 day course of doxycycline. Although the previous culture of this area was  negative 07/16/18; it arrives with the wound looking much the same. Roughly the same depth. He has thick subcutaneous tissue around the wound orifice but this still has roughly the same depth. Been using silver alginate. We applied Oasis #1 today 07/23/2018; still having to remove a lot of callus and thick subcutaneous tissue to actually define the wound here. Most of this seems to have closed down yet he has a comma shaped divot over the top of the area that still I think is open. We applied Oasis #2 His wife expressed concern about the tip of his left great toe. This almost looks like a small blister. She also showed it today to her podiatrist Dr. Geroge Baseman who did not think this was anything serious. I am not sure is anything serious either however I think it bears some watching. He is not in any pain however he is insensate 07/30/2018;; still on a lot of nonviable tissue over the surface of the wound however cleaning this up reveals a more substantial wound orifice but less of a probing wound depth. This is not probed to bone. There is no evidence of infection The wife is still concerned about a non-open area on the tip of his left great toe. Almost feels like a bony outgrowth. She had previously showed this to podiatry. I do not think this is a blister. A friction area would be possible although he is really not walking according to his wife. He had a small skin tag on his right buttock but no open wound here either 08/06/2018; we applied a third Oasis last week. Unfortunately there is really no improvement. Still requiring extensive debridement to expose the wound bed from a horizontal slitlike depression. I still have not been able to get this to fill in properly. On the positive side there is now no probable bone from when he first came into the facility. I changed him to silver alginate today after a reasonably aggressive debridement 08/13/2018; once again the patient comes in with skin and  subcutaneous tissue closing over the small probing area with the underlying cavity of the wound on the right  TMA site. I applied silver alginate to this last week. Prior to that we used Oasis x3 still not able to get this area granulating. He does not have a probing area of the bone which is an improvement from when I spur started working on this and an MRI did not suggest osteomyelitis. I do not see evidence of infection here but I am increasingly concerned about why I cannot get this area to granulate. Each time I debrided this is looks like this is simply a matter of getting granulation to fill in the hole and then getting epithelialization. This does not seem to happen Also I sent him back to see podiatry Dr. Earleen Newport about the what felt to be bony outgrowth on the tip of his left great toe. Apparently after heel he left the clinic last week or the next day he developed a blood blister. He did see Dr. Earleen Newport. He went on to have a debridement of the medial nail cuticle he now has an open area here as well as some denuded skin. They did an x-ray apparently does have a bony outgrowth or spur but I am not able to look at this. They are using topical antibiotics apparently there was some suggested he use Santyl. Patient's wife was anxious for my opinion of this 08/20/2018; we are able to keep the wound open this time instead of the thick subcutaneous tissue closing over the top of it however unfortunately once again this probes to bone. I do not see any evidence of infection and previous MRI did not show osteomyelitis. I elected to go back to the Oasis to see if we can stimulate some granulation. Last saw his vascular interventional cardiologist Dr. Andree Elk at the beginning of August and he had a repeat procedure. Nevertheless I wonder how much blood flow he has down to this area With regards to the left first toe he is seeing Dr. Earleen Newport next week. They are applying Bactroban to this  area. 08/27/2018 ooOnce again he comes in with thick eschar and subcutaneous tissue over the top of the small probing hole. This does not appear to go down to bone but it still has roughly the same depth. I reapplied Oasis today ooOver the left great toe there appears to be more of the wound at the tip of his toe than there was last week. This has an eschar on the surface of it. Will change to Santyl. There is seeing podiatry this afternoon 09/03/2018 ooHe comes in today with the area on the transmetatarsal's site with a fair amount of callus, nonviable tissue over the circumference but it was not closed. I removed all of this as well as some subcutaneous debris and reapplied Oasis. There is no exposed bone ooThe area over the tip of the left great toe started off as a nodule of uncertain etiology. He has been followed with podiatry. They have been removing part of the medial nail bed. He has nonviable tissue over the wound he been using Santyl in this area 09/10/18 ooUnfortunately comes in with neither wound area looking improved. The transmetatarsal amputation site once Again has nonviable debris over the surface requiring debridement. Unfortunately underneath this there is nothing that looks viable and this once again goes right down to bone. There is no purulent drainage and no erythema. ooAlso the surgical wound from podiatry on the left first toe has an ischemic-looking eschar over the surface of the tip of the toe I have elected not to attempt candidly  debride this ooHis wife as arranged for him to have follow-up noninvasive studies in [place] under the care of Dr. Andree Elk clinic. The question is he has known severe PAD. They had recently seen him in August. He has had revascularizations in both legs within the last 4 or 5 months. He is not complaining of pain and I cannot really get a history of claudication. He has not systemically unwell 09/20/2018 the patient has been to [facility] and been revascularized by Dr. Andree Elk earlier this week. Apparently he was able to open up the anterior tibial artery although I have not actually seen his formal report. He is going for an attempt to revascularize on the left on Monday. He is apparently working with an investigational stent for lower extremity arteries below the knee and he has talked to the patient about placing that on Monday if possible. The patient has been using silver alginate on the transmetatarsal amputation site and Santyl on the left 09/30/2018; patient had his revascularization on the left this apparently included a standard approach as well as a more distal arterial catheterization although I do not have any information on this from Dr. Andree Elk. In fact I do not even see the initial revascularization that he had on the right. I have included the arterial history from Dr. Andree Elk last note however below; ASSESSMENT/PLAN: 1. Hx of Critical limb ischemia bilateral lower extremities, PAD: -s/p transmetatarsal amputation of the RLE. -s/p right ATA percutaneous revascularization procedure x2, most recently 11/20/2016 s/p PTA of right AT 100% to less than 20% with a 2.5 x 200 balloon. 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 -03/21/2018 s/p PTA of 90% left popliteal artery to <10% with a 5x20 cutting balloon, PTA of 90% left peroneal to <20% with a 3x20 balloon, PTA of 100% left AT to <20% with a 2.5x220 balloon. 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 Will plan on the LLE soon after. Risks/benefits of procedure have been discussed and patient has elected to proceed. We have been using endoform to the right TMA amputation site wound and  Santyl to the left great toe 10/07/2018; the area on the tip of his left great toe looked better we have been using Santyl here. We continue to have a very difficult probing hole on the right TMA amputation site we have been using endoform. I went on to use his fifth Oasis today 10/14/2018; the tip of the left great toe continues to look better. We have been using Santyl here the surface however is healthy and I think we can change to an alginate. The right TMA has not changed. Once again he has no superficial opening there is callus and thick subcutaneous tissue over the orifice once you remove this there is the probing area that we have been dealing with without too much change. There is no palpable bone I have been placing Oasis here and put Oasis #6 in this today after a more vigorous debridement 10/21/18; the left great toe still has necrotic surface requiring debridement. The right TMA site hasn't changed in view of the thick callus over the wound bed. With removal of this there is still the opening however this does not appear to have the same depth.  Again there is no palpable bone. Oasis was replaced 10/28/2018; patient comes in with both wounds looking worse. The area over the first toe tip is now down to bone. The area over the TMA site is deeper and down to bone clearly with a increase in overall wound area. Equally concerning on the right TMA is the complete absence of a pulse this week which is a change. We are not even able to Doppler this. On the right he has a noncompressible ABI greater than 1.4 11/04/2018. Both wounds look somewhat worse. X-rays showed no osteomyelitis of the right foot but on the left there was underlying osteomyelitis in the left great toe distal phalanx. I been on the phone to Dr. Andree Elk surface at [facility] in [place] and they are arranging for another angiogram on the right on Monday. I have him on doxycycline for the osteomyelitis in the left great toe for  now. Infectious disease may be necessary 1/17; 2-week hiatus. Patient was admitted to hospital at [facility]. My understanding is he underwent an angioplasty of the right anterior tibial artery and had stents placed in the left anterior artery and the left tibial peroneal trunk. This was done by Dr. Andree Elk of interventional radiology. There is no major change in either 1 of the wounds. They have been using Aquacel Ag. As far as they are aware no imaging studies were done of the foot which is indeed unfortunate. I had him on doxycycline for 2 weeks since we identified the osteomyelitis in the left great toe by plain x-ray. I have renewed that again today. I am still suspicious about osteomyelitis in the amputation site and would consider doing another MRI to compare with the one done in September. The idea of hyperbaric oxygen certainly comes up for discussion 1/24; no major change in either wound area. I have him on doxycycline for osteomyelitis at the tip of the left great toe. As noted he has been previously and recently revascularized by Dr. Andree Elk at [place]. He is tolerating the doxycycline well. For some reason we do not have an infectious disease consult yet. Culture of drainage from the right foot site last week was negative 1/31; MRI of the right foot did not show osteomyelitis of the right ankle and foot. Notable for a skin ulceration overlying the second metatarsal stump with generalizing soft tissue edema of the ankle and foot consistent with cellulitis. Noted to have a partial-thickness tear of the Achilles tendon 7.5 cm proximal to the insertion Nothing really new in terms of symptoms. Patient's area on the tip of the left great toe is just about closed although he still has the probing area on the metatarsal amputation site. Appointment with Dr. Linus Salmons of infectious disease next week 2/7; Dr. Novella Olive did not feel that any further antibiotics were necessary he would follow-up in 2 months. The left  great toe appears to be closed still some surface callus that I gently looked under high did not see anything open or anything that was threatening to be open. He still has the open area on the mid part of his TMA site using endoform 2/14; left great toe is closed and he is completing his doxycycline as of last Sunday. He is not on any antibiotics. Unfortunately out of the right foot his wife noticed some subdermal hemorrhage this week. He had been walking 2 miles I had given him permission to do so this is not really a plantar wound. Using endoform to this wound but  I changed to silver alginate this week 2/21; left great toe remains closed. Culture last week grew Streptococcus angiosis which I am not really familiar with however it is penicillin sensitive and I am going to put him on Augmentin. Not much change in the wound on the right foot the deep area is still probing precariously close to bone and the wound on the margin of the TMA is larger. 2/28; left great toe remains closed. He is completing the Augmentin I gave him last week. 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 This is being shown to Dr. Andree Elk at [place] to see if there is anything else that can be done here. He has been using silver alginate strips on the right 3/6; left great toe remains closed. He sees Dr. Andree Elk on Monday. The area on the plantar aspect of the right foot has the same small orifice with thick callused tissue around this. However this time with removal of the callus tissue the wound is open all the way along the incision line to the end medially. We have been using silver alginate. 3/13; left toe remains closed although the area is callused. He sees Dr. Jacqualyn Posey of podiatry next week. The area on the plantar right foot looked a lot better this week. Culture I did of this was negative we use silver alginate. He is going next week for an attempt at revascularization by Dr.  Andree Elk 3/23; left toe remains closed although the area is callused. He will see Dr. Jacqualyn Posey in follow-up. The area on the right plantar foot continues to look surprisingly better over the last 3 visits. We have been using silver alginate. The revascularization he was supposed to have by Dr. Andree Elk at [facility] in [place] has been canceled [facility] procedure] 4/6; the right foot remains closed albeit callused. Podiatry canceled the appointment with regards to the left great toe. He has not seen Dr. Andree Elk at [facility] but thinks that Dr. Andree Elk has "done all he can do". He would be a candidate for the hemostaemix trial Readmission 07/29/2019 Mr. Stann Mainland is a man we know well from at least 3 previous stays in this clinic. He is a type II diabetic with severe PAD followed by Dr. Andree Elk at [facility] in [place]. 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 So far everything in both of these areas has remained closed. About 2 weeks ago he went to see his podiatrist at [facility] Dr. Babs Bertin. He had a thick callus on the left fifth metatarsal head that was shaved. He has developed an open wound in this area. They have been offloading this in his diabetic shoes. The patient has not had any more revascularizations by Dr. Andree Elk since the last time he was here. ABI in our clinic at the posterior tibial on the left was 1.06 10/6; no real change in the area on the plantar met head. Small wound with 2 mm of depth. He has thick skin probably from pressure around the wound. We have been using silver alginate 10/13; small wound in the left fifth plantar met head. Arrives today with undermining laterally and purulent drainage. Our intake nurse cultured this. His wife stated they noticed a change in color over the last day or 2. He is not systemically unwell 10/19; small wound on the  fifth plantar metatarsal head. Culture I did last week showed Staphylococcus lugdunensis. Although this could be a skin contaminant the possibility of a skin and soft tissue infection was there. I did give him empiric doxycycline which should have covered this. We are using silver alginate to the wound. We put him in a total contact cast today. 10/22; small wound on the fifth plantar metatarsal head. He has completed antibiotics. He also has severe PAD which worries me about just about any wound on this man's foot 10/29; small superficial area on the fifth plantar metatarsal head. Measuring slightly smaller. He is not currently on any antibiotics. I been using a total contact cast in this man with very severe PAD but he seems to be tolerating this well 11/5; left plantar fifth metatarsal head. Silver alginate being used under a total contact cast 11/12; wound not much different than last week. Using a #15 scalpel debridement around the wound. Still silver collagen under a total contact cast. He has severe PAD and that may be playing a role in this 11/19; disappointing that the wound is not really changed that much. I think it is come down in overall surface area because originally this was on the lateral part of the fifth metatarsal head however recently it is not really changed. Most of this is filled in but it will not epithelialized there is surface debris on this which may be mostly related to ischemia. They have an appointment with Dr. Andree Elk on 12/9 09/24/2019 on evaluation today patient appears to be doing somewhat better with regard to the wound on the left fifth metatarsal head. Fortunately there does not appear to be any signs of active infection at this time. No fevers, chills, nausea, vomiting, or diarrhea. The wound does not appear to be completely closed and I do feel like the Hydrofera Blue is helping to some degree although I feel like the cast as well as keeping things from  breaking down or getting any larger at least. As far as his wound overview it shows that the overall size of the wound is slightly smaller but really maintaining within the realm of about the same. He had no troubles with the cast which is good news. 12/1; left fifth metatarsal head. Perhaps somewhat more vibrant. We have been using Hydrofera Blue. He sees Dr. Andree Elk at [place] 1 week tomorrow. He be back next week and I hope to have a better idea which way the wound is going however I will not cast him next week in case Dr. Andree Elk wants to reevaluate things in his foot. The left leg is the leg with the stent in place and I wonder whether these are going to have to be reevaluated. 12/8; left fifth metatarsal head. Quite a bit better this week. Only a small open area remains. He sees Dr. Andree Elk at [place] tomorrow. Dr. Andree Elk is previously done his vascular interventions. He has 2 stents in the left leg as I remember things. I therefore will not put him in a total contact cast today. He will be using a forefoot off loader. We will use silver collagen on the wound. We will see him again next week 12/15; left fifth metatarsal head. He saw Dr. Andree Elk and is scheduled for an angiogram on Thursday. Unfortunately although his wound was a lot better last week it is really deteriorated this week. Small punched-out hole with depth and overhanging tissue. We have been using Hydrofera Blue 12/22; patient had an 80% stenosis proximal  to the stent I believe in the below-knee popliteal artery. This was opened by Dr. Andree Elk. According the patient the stent itself was satisfactory. I have not been able to review this note. 12/29; patient arrives today with the wound about the same size some undermining medially. We have been using Hydrofera Blue under a total contact cast and that is what we will do again today 11/04/2019. Slightly smaller in orifice but about 4 mm undermining almost circumferentially. We have been  using Hydrofera Blue. Apparently the Hydrofera Blue did not stay on the wound after we removed the cast. Some minor looking cast irritation on the right lateral 1/12; unfortunately things did not go well this week. Arrives in clinic today with a deeper wound with undermining more concerning a area of pus. Specimen was obtained for culture. We are not going to be able to put him in a cast today. 1/19; purulent material last week which I cultured showed Enterobacter cloacae. He was on ampicillin previously prescribed by his primary doctor which should not have covered this however mysteriously the wound looks somewhat better. There is less depth less erythema certainly no drainage. He will need a course of ciprofloxacin which I will provide today. 1/26; he has completed the ciprofloxacin. I don't think he needs any additional antibiotics. The areas on the left fifth plantar met head. We've been using silver alginate 2/2; comes in today with 0.4 cm of circumferential undermining around a small wound in this area. We have been using silver alginate with a forefoot offloading boot 2/9; again the wound appears larger nonviable surface and with 0.4 cm roughly of circumferential undermining. We have been using silver alginate with a forefoot offloading boot. The wound does not look infected however his wife is quick to point out about swelling on the dorsal foot. 2/16; not much change. Comes in with a small wound with a nonviable necrotic surface. Again marked undermining that I had totally removed last week. He had a arterial Doppler last week in Dr. Andree Elk office at [facility]. They have not heard these results. They are somewhat frustrated. 2/23; if anything this is worse. Undermining increased depth. Nonviable surface that looks pale. According his wife is TBI on the right was 0.22 although I have not verified this. He is going for an angiogram with Dr. Andree Elk next Monday. We are using polymen and  offloading in a forefoot offloading boot. 3/2; the patient was revascularized yesterday by [facility]. He had successful PTA of the left peroneal 60% to less than 20% and successful PTA of the left ATA 100% to less than 20%. He now has a pulse in the left foot Now that he has some additional blood flow there is not a lot of options here. On one hand we want activity to help keep blood flow augmented on the other hand pressure relief is going to be paramount if we have any chance to get this to close. After some discussion with his wife and patient I went ahead and put him in a total contact cast. 3/9; he arrives in clinic today with some debris over the wound surface. We use silver collagen under a total contact cast after his revascularization by Dr. Andree Elk 3/16; wound about the same depth at 0.5 cm. Surface area perhaps slightly less but it is the depth the really is important. We have been using silver collagen 3/23; not much change here. There is still undermining medially the depth is about the same  tissue looks somewhat better. We switched to Iodoflex last week to try to get a better looking wound surface. He does have a small abrasion over the proximal interphalangeal joint dorsally. I this is so small it is difficult even to tell whether it is open. Some abrasion between the fifth toe and the webspace as well 3/30; if anything the wound is deeper this week and larger. Very disappointing. Under illumination debris on the surface which I debrided gently with a #3 curette there is minimal bleeding. We had been using iodoflex under a total contact cast. I am going to take him out of the cast and change him to [place] today which his wife will change daily. Objective Constitutional Sitting or standing Blood Pressure is within target range for patient.. Pulse regular and within target range for patient.Marland Kitchen Respirations regular, non-labored and within target range.. Temperature is  normal and within the target range for the patient.Marland Kitchen Appears in no distress. Vitals Time Taken: 12:47 PM, Height: 69 in, Weight: 210 lbs, BMI: 31, Temperature: 98.4 F, Pulse: 81 bpm, Respiratory Rate: 18 breaths/min, Blood Pressure: 118/70 mmHg, Capillary Blood Glucose: 126 mg/dl. General Notes: Wound exam; no improvement of anything larger in all dimensions. Light surface debridement very fibrinous gritty surface. He does not probe to bone but he is not that far off. No surrounding infection is obvious Integumentary (Hair, Skin) Wound #5 status is Open. Original cause of wound was Gradually Appeared. The wound is located on the Left Metatarsal head fifth. The wound measures 1.2cm length x 1cm width x 0.6cm depth; 0.942cm^2 area and 0.565cm^3 volume. There is Fat Layer (Subcutaneous Tissue) Exposed exposed. There is no tunneling or undermining noted. There is a medium amount of serous drainage noted. The wound margin is well defined and not attached to the wound base. There is small (1-33%) pink, pale granulation within the wound bed. There is a large (67-100%) amount of necrotic tissue within the wound bed including Adherent Slough. General Notes: maceration present to periwound. Assessment Active Problems ICD-10 Type 2 diabetes mellitus with foot ulcer Type 2 diabetes mellitus with diabetic peripheral angiopathy without gangrene Non-pressure chronic ulcer of other part of left foot limited to breakdown of skin Procedures Wound #5 Pre-procedure diagnosis of Wound #5 is a Diabetic Wound/Ulcer of the Lower Extremity located on the Left Metatarsal head fifth .Severity of Tissue Pre Debridement is: Fat layer exposed. There was a Excisional Skin/Subcutaneous Tissue Debridement with a total area of 1.2 sq cm performed by Ricard Dillon., MD. With the following instrument(s): Curette Material removed includes Callus, Subcutaneous Tissue, Slough, Skin: Dermis, and Skin: Epidermis after  achieving pain control using Lidocaine 5% topical ointment. No specimens were taken. A time out was conducted at 13:15, prior to the start of the procedure. A Moderate amount of bleeding was controlled with Pressure. The procedure was tolerated well with a pain level of 0 throughout and a pain level of 0 following the procedure. Post Debridement Measurements: 1.2cm length x 1cm width x 0.6cm depth; 0.565cm^3 volume. Character of Wound/Ulcer Post Debridement is improved. Severity of Tissue Post Debridement is: Fat layer exposed. Post procedure Diagnosis Wound #5: Same as Pre-Procedure Plan Follow-up Appointments: Return Appointment in 1 week. Dressing Change Frequency: Wound #5 Left Metatarsal head fifth: Change dressing every day. Wound Cleansing: Wound #5 Left Metatarsal head fifth: May shower and wash wound with soap and water. Primary Wound Dressing: Wound #5 Left Metatarsal head fifth: Santyl Ointment Other: - pad right great toe for  protection Secondary Dressing: Dry Gauze Foam Border Radiology ordered were: X-ray, foot, left - non healing wound 1. The wound measures larger. 2. Santyl change daily I have taken him out of a total contact cast 3. If I can get the surface of this better then maybe reattempt the Dermagraft 4. one can but wonder if we simply do not have enough blood flow in this area to heal this wound. 5. I did not culture this but I will get an x-ray. Consider culture next week Electronic Signature(s) Signed: 02/02/2020 7:44:34 AM By: [name] MD Entered By: [name] on 01/27/2020 13:36:56 -------------------------------------------------------------------------------- SuperBill Details Patient Name: Date of Service: KAISEI, GILBO 01/27/2020 Medical Record [id number] Patient Account Number: [id number] Date of Birth/Sex: Treating RN: 1945/11/11 (73 y.o. [name]) [name] Primary Care Provider: [name] Other Clinician: Referring  Provider: Treating Provider/Extender:Travontae Freiberger, [name], [name] in Treatment: 26 Diagnosis Coding ICD-10 Codes Code Description E11.621 Type 2 diabetes mellitus with foot ulcer E11.51 Type 2 diabetes mellitus with diabetic peripheral angiopathy without gangrene L97.521 Non-pressure chronic ulcer of other part of left foot limited to breakdown of skin Facility Procedures CPT4 Code Description: [id number] - DEB SUBQ TISSUE 20 SQ CM/< ICD-10 Diagnosis Description E11.621 Type 2 diabetes mellitus with foot ulcer L97.521 Non-pressure chronic ulcer of other part of left foot limite Modifier: d to breakdo Quantity: 1 wn of skin Physician Procedures CPT4 Code Description: [id number] - WC PHYS SUBQ TISS 20 SQ CM ICD-10 Diagnosis Description E11.621 Type 2 diabetes mellitus with foot ulcer L97.521 Non-pressure chronic ulcer of other part of left foot limite Modifier: d to breakdo Quantity: 1 wn of skin Electronic Signature(s) Signed: 02/02/2020 7:44:34 AM By: [name] MD Entered By: [name] on 01/27/2020 13:37:14

## 2020-02-03 ENCOUNTER — Other Ambulatory Visit: Payer: Self-pay

## 2020-02-03 ENCOUNTER — Ambulatory Visit (HOSPITAL_COMMUNITY)
Admission: RE | Admit: 2020-02-03 | Discharge: 2020-02-03 | Disposition: A | Discharge status: Home / Self Care | Payer: Medicare Other | Source: Ambulatory Visit | Attending: Internal Medicine | Admitting: Internal Medicine

## 2020-02-03 ENCOUNTER — Other Ambulatory Visit (HOSPITAL_COMMUNITY)
Admission: RE | Admit: 2020-02-03 | Discharge: 2020-02-03 | Disposition: A | Discharge status: Home / Self Care | Payer: Medicare Other | Attending: Internal Medicine | Admitting: Internal Medicine

## 2020-02-03 ENCOUNTER — Other Ambulatory Visit: Payer: Self-pay | Admitting: Internal Medicine

## 2020-02-03 ENCOUNTER — Other Ambulatory Visit (HOSPITAL_COMMUNITY): Payer: Self-pay | Admitting: Internal Medicine

## 2020-02-03 ENCOUNTER — Encounter (HOSPITAL_BASED_OUTPATIENT_CLINIC_OR_DEPARTMENT_OTHER): Payer: Medicare Other | Attending: Internal Medicine | Admitting: Internal Medicine

## 2020-02-03 DIAGNOSIS — Z794 Long term (current) use of insulin: Secondary | ICD-10-CM | POA: Diagnosis not present

## 2020-02-03 DIAGNOSIS — E1140 Type 2 diabetes mellitus with diabetic neuropathy, unspecified: Secondary | ICD-10-CM | POA: Diagnosis not present

## 2020-02-03 DIAGNOSIS — Z89431 Acquired absence of right foot: Secondary | ICD-10-CM | POA: Diagnosis not present

## 2020-02-03 DIAGNOSIS — M1990 Unspecified osteoarthritis, unspecified site: Secondary | ICD-10-CM | POA: Diagnosis not present

## 2020-02-03 DIAGNOSIS — L97521 Non-pressure chronic ulcer of other part of left foot limited to breakdown of skin: Secondary | ICD-10-CM | POA: Diagnosis not present

## 2020-02-03 DIAGNOSIS — E11621 Type 2 diabetes mellitus with foot ulcer: Secondary | ICD-10-CM | POA: Insufficient documentation

## 2020-02-03 DIAGNOSIS — E1151 Type 2 diabetes mellitus with diabetic peripheral angiopathy without gangrene: Secondary | ICD-10-CM | POA: Diagnosis present

## 2020-02-03 DIAGNOSIS — I2510 Atherosclerotic heart disease of native coronary artery without angina pectoris: Secondary | ICD-10-CM | POA: Diagnosis not present

## 2020-02-03 DIAGNOSIS — I10 Essential (primary) hypertension: Secondary | ICD-10-CM | POA: Insufficient documentation

## 2020-02-03 DIAGNOSIS — R2242 Localized swelling, mass and lump, left lower limb: Secondary | ICD-10-CM | POA: Diagnosis present

## 2020-02-04 NOTE — Progress Notes (Signed)
CAYDN, JUSTEN ([id number]) Visit Report for 02/03/2020 Arrival Information Details Patient Name: Date of Service: [name], HEESCH 02/03/2020 12:30 PM Medical Record [id number] Patient Account Number: [id number] Date of Birth/Sex: Treating RN: 11-07-1945 (74 y.o. [name] Primary Care Arfa Lamarca: [name] Other Clinician: Referring Raziel Koenigs: Treating Dallon Dacosta/Extender:[name], [name], [name] in Treatment: 27 Visit Information History Since Last Visit Added or deleted any medications: No Patient Arrived: Ambulatory Any new allergies or adverse reactions: No Arrival Time: 13:00 Had a fall or experienced change in No Accompanied By: wife activities of daily living that may affect Transfer Assistance: None risk of falls: Patient Identification Verified: Yes Signs or symptoms of abuse/neglect since last No Secondary Verification Process Completed: Yes visito Patient Requires Transmission-Based No Hospitalized since last visit: No Precautions: Implantable device outside of the clinic excluding No Patient Has Alerts: No cellular tissue based products placed in the center since last visit: Has Dressing in Place as Prescribed: Yes Pain Present Now: No Electronic Signature(s) Signed: 02/03/2020 6:07:04 PM By: Kela Millin Entered By: Kela Millin on 02/03/2020 13:00:21 -------------------------------------------------------------------------------- Clinic Level of Care Assessment Details Patient Name: Date of Service: VINAL, [name] 02/03/2020 12:30 PM Medical Record [id number] Patient Account Number: [id number] Date of Birth/Sex: Treating RN: 1946-08-14 (73 y.o. [name]) [name] Primary Care Brandan Robicheaux: [name] Other Clinician: Referring Orlean Holtrop: Treating Caitlin Hillmer/Extender:[name], [name], [name] in Treatment: 27 Clinic Level of Care Assessment Items TOOL 4 Quantity Score X - Use when only an EandM is performed on  FOLLOW-UP visit 1 0 ASSESSMENTS - Nursing Assessment / Reassessment X - Reassessment of Co-morbidities (includes updates in patient status) 1 10 X - Reassessment of Adherence to Treatment Plan 1 5 ASSESSMENTS - Wound and Skin Assessment / Reassessment X - Simple Wound Assessment / Reassessment - one wound 1 5 []  - Complex Wound Assessment / Reassessment - multiple wounds 0 []  - Dermatologic / Skin Assessment (not related to wound area) 0 ASSESSMENTS - Focused Assessment []  - Circumferential Edema Measurements - multi extremities 0 []  - Nutritional Assessment / Counseling / Intervention 0 []  - Lower Extremity Assessment (monofilament, tuning fork, pulses) 0 []  - Peripheral Arterial Disease Assessment (using hand held doppler) 0 ASSESSMENTS - Ostomy and/or Continence Assessment and Care []  - Incontinence Assessment and Management 0 []  - Ostomy Care Assessment and Management (repouching, etc.) 0 PROCESS - Coordination of Care X - Simple Patient / Family Education for ongoing care 1 15 []  - Complex (extensive) Patient / Family Education for ongoing care 0 X - Staff obtains Programmer, systems, Records, Test Results / Process Orders 1 10 []  - Staff telephones HHA, Nursing Homes / Clarify orders / etc 0 []  - Routine Transfer to another Facility (non-emergent condition) 0 []  - Routine Hospital Admission (non-emergent condition) 0 []  - New Admissions / Biomedical engineer / Ordering NPWT, Apligraf, etc. 0 []  - Emergency Hospital Admission (emergent condition) 0 X - Simple Discharge Coordination 1 10 []  - Complex (extensive) Discharge Coordination 0 PROCESS - Special Needs []  - Pediatric / Minor Patient Management 0 []  - Isolation Patient Management 0 []  - Hearing / Language / Visual special needs 0 []  - Assessment of Community assistance (transportation, D/C planning, etc.) 0 []  - Additional assistance / Altered mentation 0 []  - Support Surface(s) Assessment (bed, cushion, seat, etc.)  0 INTERVENTIONS - Wound Cleansing / Measurement X - Simple Wound Cleansing - one wound 1 5 []  - Complex Wound Cleansing - multiple wounds 0 X - Wound Imaging (photographs - any number of  wounds) 1 5 []  - Wound Tracing (instead of photographs) 0 X - Simple Wound Measurement - one wound 1 5 []  - Complex Wound Measurement - multiple wounds 0 INTERVENTIONS - Wound Dressings X - Small Wound Dressing one or multiple wounds 1 10 []  - Medium Wound Dressing one or multiple wounds 0 []  - Large Wound Dressing one or multiple wounds 0 X - Application of Medications - topical 1 5 []  - Application of Medications - injection 0 INTERVENTIONS - Miscellaneous []  - External ear exam 0 []  - Specimen Collection (cultures, biopsies, blood, body fluids, etc.) 0 []  - Specimen(s) / Culture(s) sent or taken to Lab for analysis 0 []  - Patient Transfer (multiple staff / / Similar devices) 0 []  - Simple Staple / Suture removal (25 or less) 0 []  - Complex Staple / Suture removal (26 or more) 0 []  - Hypo / Hyperglycemic Management (close monitor of Blood Glucose) 0 []  - Ankle / Brachial Index (ABI) - do not check if billed separately 0 X - Vital Signs 1 5 Has the patient been seen at the hospital within the last three years: Yes Total Score: 90 Level Of Care: New/Established - Level 3 Electronic Signature(s) Signed: 02/03/2020 6:43:44 PM By: RN Entered By: on 02/03/2020 18:32:00 -------------------------------------------------------------------------------- Lower Extremity Assessment Details Patient Name: Date of Service: [name], LOUGHNEY 02/03/2020 12:30 PM Medical Record Patient Account Number: Date of Birth/Sex: Treating RN: August 25, 1946 (74 y.o. Primary Care Caylah Plouff: Other Clinician: Referring Yamilet Mcfayden: Treating Verna Desrocher/Extender:[name], , 04/04/2020 in Treatment: 27 Edema Assessment Assessed:  [Left: No] [Right: No] Edema: [Left: Ye] [Right: s] Calf Left: Right: Point of Measurement: cm From Medial Instep 40 cm cm Ankle Left: Right: Point of Measurement: cm From Medial Instep 30 cm cm Vascular Assessment Pulses: Dorsalis Pedis Palpable: [Left:Yes] Electronic Signature(s) Signed: 02/03/2020 6:07:04 PM By: 04/04/2020 Entered By: [name] Loots on 02/03/2020 13:03:00 -------------------------------------------------------------------------------- Multi Wound Chart Details Patient Name: Date of Service: EDAN, JUDAY 02/03/2020 12:30 PM Medical Record 08/30/1946 Patient Account Number: ([id number] Date of Birth/Sex: Treating RN: 09-07-1946 (73 y.o. 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] Comorbid History: [5:Cataracts, Chronic sinus N/A problems/congestion, Coronary Artery Disease, Hypertension, Peripheral Arterial Disease, Type II Diabetes, Osteoarthritis, Neuropathy] Date Acquired: [5:07/14/2019] [N/A:N/A] Weeks of Treatment: [5:27] [N/A:N/A] Wound Status: [5:Open] [N/A:N/A] Measurements L x W x D 0.7x1x0.5 [N/A:N/A] (cm) Area (cm) : [5:0.55] [N/A:N/A] Volume (cm) : [5:0.275] [N/A:N/A] % Reduction in Area: [5:-773.00%] [N/A:N/A] % Reduction in Volume: -2015.40% [N/A:N/A] Classification: [5:Grade 2] [N/A:N/A] Exudate Amount: [5:Medium] [N/A:N/A] Exudate Type: [5:Serosanguineous] [N/A:N/A] Exudate Color: [5:red,  brown] [N/A:N/A] Wound Margin: [5:Well defined, not attached N/A] Granulation Amount: [5:Medium (34-66%)] [N/A:N/A] Granulation Quality: [5:Pink, Pale] [N/A:N/A] Necrotic Amount: [5:Medium (34-66%)] [N/A:N/A] Exposed Structures: [5:Fat Layer (Subcutaneous N/A Tissue) Exposed: Yes Fascia: No Tendon: No Muscle: No Joint: No Bone: No None] [N/A:N/A] Treatment Notes Electronic Signature(s) Signed: 02/03/2020 6:13:52 PM By: [id number] MD Signed: 02/03/2020 6:43:44 PM By: 08/30/1946 RN Entered By: 08-28-1985 on 02/03/2020 13:50:53 -------------------------------------------------------------------------------- Multi-Disciplinary Care Plan Details Patient Name: Date of Service: [name]  02/03/2020 12:30 PM Medical Record [id number] Patient Account Number: [id number] Date of Birth/Sex: Treating RN: 04-30-1946 (73 y.o. [name] [name] Primary Care Francesca Strome: [name] Other Clinician: Referring Zeven Kocak: Treating Jessey Stehlin/Extender:[name], [name], [name] in Treatment: 27 Active Inactive Wound/Skin Impairment Nursing Diagnoses: Knowledge deficit related to ulceration/compromised skin integrity Goals: Patient/caregiver will verbalize understanding of skin care regimen Date Initiated: 07/29/2019 Target Resolution Date: 03/05/2020 Goal Status: Active Ulcer/skin breakdown will have a volume reduction of 30% by week 4 Date Inactivated: 10/21/2019 Target Resolution Date Initiated: 07/29/2019 Date: 10/10/2019 Unmet Reason: comorbities. Goal Status: Unmet blood flow Ulcer/skin breakdown will have a volume reduction of 50% by week 8 Date Initiated: 10/21/2019 Date Inactivated: 12/02/2019 Target Resolution Date: 11/21/2019 Goal Status: Unmet Unmet Reason: comorbities Ulcer/skin breakdown will have a volume reduction of 80% by week 12 Date Initiated: 12/02/2019 Date Inactivated: 01/06/2020 Target Resolution Date: 01/02/2020 Goal Status: Unmet Unmet Reason:  comorbities Interventions: Assess patient/caregiver ability to obtain necessary supplies Assess patient/caregiver ability to perform ulcer/skin care regimen upon admission and as needed Assess ulceration(s) every visit Notes: Electronic Signature(s) Signed: 02/03/2020 6:43:44 PM By: [name] RN Entered By: [name] on 02/03/2020 13:42:48 -------------------------------------------------------------------------------- Pain Assessment Details Patient Name: Date of Service: SEVE, [name] 02/03/2020 12:30 PM Medical Record [id number] Patient Account Number: [id number] Date of Birth/Sex: Treating RN: 1946-04-28 (74 y.o. [name] Primary Care Paislea Hatton: [name] Other Clinician: Referring Lendora Keys: Treating Zandra Lajeunesse/Extender:[name], [name], [name] in Treatment: 27 Active Problems Location of Pain Severity and Description of Pain Patient Has Paino No Site Locations Pain Management and Medication Current Pain Management: Electronic Signature(s) Signed: 02/03/2020 6:07:04 PM By: Cherylin Mylar Entered By: Cherylin Mylar on 02/03/2020 13:02:41 -------------------------------------------------------------------------------- Patient/Caregiver Education Details Patient Name: Date of Service: [name] Loots 4/6/2021andnbsp12:30 PM Medical Record [id number] Patient Account Number: [id number] Date of Birth/Gender: July 06, 1946 (74 y.o. M) Treating RN: [name] Primary Care Physician: [name] Other Clinician: Referring Physician: Treating Physician/Extender:[name], [name], [name] in Treatment: 27 Education Assessment Education Provided To: Patient Education Topics Provided Wound/Skin Impairment: Methods: Explain/Verbal Responses: State content correctly Electronic Signature(s) Signed: 02/03/2020 6:43:44 PM By: [name] RN Entered By: [name] on 02/03/2020  13:43:02 -------------------------------------------------------------------------------- Wound Assessment Details Patient Name: Date of Service: [name], ORAVEC 02/03/2020 12:30 PM Medical Record [id number] Patient Account Number: [id number] Date of Birth/Sex: Treating RN: 1946/10/18 (74 y.o. [name] Primary Care Torianne Laflam: [name] Other Clinician: Referring Arlee Bossard: Treating Senan Urey/Extender:[name], [name], [name] in Treatment: 27 Wound Status Wound Number: 5 Primary Diabetic Wound/Ulcer of the Lower Extremity Etiology: Wound Location: Left Metatarsal head fifth Wound Open Wounding Event: Gradually Appeared Status: Date Acquired: 07/14/2019 Comorbid Cataracts, Chronic sinus problems/congestion, Weeks Of Treatment: 27 History: Coronary Artery Disease, Hypertension, Clustered Wound: No Peripheral Arterial Disease, Type II Diabetes, Osteoarthritis, Neuropathy Wound Measurements Length: (cm) 0.7 Width: (cm) 1 Depth: (cm) 0.5 Area: (cm) 0.55 Volume: (cm) 0.275 Wound Description Classification: Grade 2 Wound Margin: Well defined, not attached Exudate Amount: Medium Exudate Type: Serosanguineous Exudate Color: red, brown Wound Bed Granulation Amount: Medium (34-66%) Granulation Quality: Pink, Pale Necrotic Amount: Medium (34-66%) Necrotic Quality: Adherent Slough After Cleansing: No brino Yes Exposed Structure posed: No (Subcutaneous Tissue) Exposed: Yes posed: No posed: No osed: No sed: No % Reduction in Area: -773% % Reduction in Volume: -2015.4% Epithelialization: None Tunneling: No Undermining: No Foul Odor Slough/Fi Fascia Ex Fat Layer Tendon Ex Muscle Ex Joint Exp Bone Expo Electronic Signature(s) Signed: 02/03/2020 6:07:04 PM By: Cherylin Mylar Entered By: Cherylin Mylar on  02/03/2020 13:08:43 -------------------------------------------------------------------------------- Vitals Details Patient  Name: Date of Service: LABRANDON, [name] 02/03/2020 12:30 PM Medical Record [id number] Patient Account Number: [id number] Date of Birth/Sex: Treating RN: [birth year] (74 y.o. [name] Primary Care Shanesha Bednarz: [name] Other Clinician: Referring Shanah Guimaraes: Treating Donley Harland/Extender:[name], [name], [name] in Treatment: 27 Vital Signs Time Taken: 13:00 Temperature (F): 98.1 Height (in): 69 Pulse (bpm): 74 Weight (lbs): 210 Respiratory Rate (breaths/min): 18 Body Mass Index (BMI): 31 Blood Pressure (mmHg): 117/67 Capillary Blood Glucose (mg/dl): 902 Reference Range: 80 - 120 mg / dl Notes patient stated CBG was 133 this morning Electronic Signature(s) Signed: 02/03/2020 6:07:04 PM By: Cherylin Mylar Entered By: Cherylin Mylar on 02/03/2020 13:01:05

## 2020-02-05 NOTE — Progress Notes (Signed)
[name] ([id number]) Visit Report for 02/03/2020 HPI Details Patient Name: Date of Service: [name], [name] 02/03/2020 12:30 PM Medical Record [id number] Patient Account Number: [id number] Date of Birth/Sex: Treating RN: 1946-10-28 (73 y.o. [name]) [name] Primary Care Provider: Rory Guzman Other Clinician: Referring Provider: Treating Provider/Extender:[name], [name], [name] in Treatment: 27 History of Present Illness HPI Description: 05/18/16; this is a 74year-old diabetic who is a type II diabetic on insulin. The history is that he traumatized his right foot developed a sore sometime in late March. Shortly thereafter he went on a cruise but he had to get off the cruise ship in [place] and fly urgently back to [place] where he was admitted to [facility] and ultimately underwent a transmetatarsal amputation by Dr. 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 According to the patient and his wife this wound never really healed. He was seen on 2 occasions in the wound care center in [place] and had vascular studies and then was referred urgently to Dr. Bridgett Guzman of vascular surgery. He underwent an angiogram on 05/10/16. Unfortunately nothing really could be done to improve his vascular status. He had a 75-90% stenosis in the midsegment of 1 segment of the posterior femoral artery. He had a patent popliteal, his anterior tibial occluded shortly after takeoff. Perineal had a greater than 90% stenosis posterior tibial is occluded feet had no distal collaterals feed distal aspect of the transmetatarsal amputation site. The patient tells me that he had a prolonged period of Santyl by Dr. Doran Guzman was some initial improvement but then this was stopped. I think they're only applying daily dressings/dry dressings. He has not had a recent x-ray of the right foot he did have one before his surgery in April. His wife by the dimensions of the wound/surgical site being followed at  home since 4/20. At that point the dimensions were 0.5 x 12 x 0.2 on 7/19 this was 1.8 x 6 x 0.4. He is not currently on any antibiotics. His hemoglobin A1c in early April was 12.9 at that point he was started on insulin. Apparently his blood sugars are much lower he has an appointment with [name] next week. 05/29/16 x-ray of the area did not show osteomyelitis. I think he probably needs an MRI at this point. His wife is asking about something called"Yireh" cream which is not FDA approved. I have not heard of this. 06/22/16; MRI did not really suggest osteomyelitis. There was minimal marrow edema and enhancement in the stump of the second metatarsal felt to be secondary likely to postoperative change rather than osteomyelitis. The patient has arranged his own consultation with [name] at [facility], apparently their daughter lives in [place] and has some connection here. Any improvement in vascular supply by [name] would of course be helpful. Dr. Bridgett Guzman did not feel that anything further could be done other than amputation if wound care did not result in healing or if the area deteriorates. 06/26/16; the patient has been to see [name] at [facility] and had an angiogram. He is going for a procedure on Thursday which will involve catheterization. I'm not sure if this is an anterograde or retrograde approach. He has been using Santyl to the wound 07/10/16; the patient had a repeat angiogram and angioplasty at [facility] by Dr. Brunetta Guzman. His angiogram showed right CFA and profundal widely patent. The right as of a.m. popliteal artery were widely patent the right anterior tibial was occluded proximally and reconstitutes at  the ankle. Peroneal artery was patent to the foot. Posterior tibial artery was occluded. The patient had angioplasty of the anterior tibial artery.. This was quite successful. He was recommended for Plavix as well as aspirin. 07/17/16; the patient was close to be a nurse  visit today however the outer dressing of the Apligraf fell off. Noted drainage. I was asked to see the wound. The patient is noted an odor however his wife had noted that. Drainage with Apligraf not necessarily a bad thing. He has not been systemically unwell 07/24/16; we are still have an issue with drainage of this wound. In spite of this I applied his second Apligraf. Medially the area still is probing to bone. 08/07/16; Apligraf reapplied in general wound looks improved. 08/21/16 Apligraf #4. Wound looks much better 09/04/16 patientt's wound again today continues to appear to improve with the application of the Apligraf's. He notes no increased discomfort or concerns at this point in time. 09/18/16; the patient returns today 2 weeks after his fifth application of Apligraf. Predictably three quarters of the width of this wound has healed. The deep area that probe to bone medially is still open. The patient asked how much out-of-pocket dollars would be for additional Apligraf's. 09/25/16; now using Hydrofera Blue. He has completed 5 Apligraf applications with considerable improvement in this deep open transmetatarsal amputation site. His wound is now a triangular-shaped wound on the medial aspect. At roughly 12 to 2:00 this probes another centimeter but as opposed to in the past this does not probe to bone. The patient has been seen at [place] by Dr. Zenia Guzman. He is not planning to do any more revascularization unless the wound stalls or worsens per the patient 10/02/16; 0.7 x 0.8 x 0.8. Unfortunately although the wound looks stable to improved. There is now easily probable bone. This hasn't been present for several weeks. Patient is not otherwise symptomatic he is not experiencing any pain. I did a culture of the wound bed 10/09/16. Deterioration last week. Culture grew MRSA and although there is improvement here with doxycycline prescribed over the phone I'm going to try to get him linezolid 600 twice  a day for 10 days today. 10/16/16; he is completing a weeks worth of linezolid and still has 3 more days to go. Small triangular-shaped open area with some degree of undermining. There is still palpable bone with a curet. Overall the area appears better than last week 10/20/16 he has completed the linezolid still having some nausea and vomiting but no diarrhea. He has exposed bone this week which is a deterioration. 10/27/16 patient now has a small but probing wound down to bone. Culture of this bone that I did last week showed a few methicillin-resistant staph aureus. I have little doubt that this represents acute/subacute osteomyelitis. The patient is currently on Doxy which I will continue he also completed 10 days of linezolid. We are now in a difficult situation with this patient's foot after considerable discussion we will send him back to see Dr. Doran Guzman for a surgical opinion of this I'm also going to try to arrange a infectious disease consult at [facility] hopefully week and get this prior to her usual 4-6 weeks we having [place]. The patient clearly is going to need 6 weeks of IV vancomycin. If we cannot arrange this expediently I'll have to consider ordering this myself through a home infusion company 11/03/16; the patient now has a small in terms of circumference but probing wound. No bone palpable today. The  patient remains on doxycycline 100 twice a day which should support him until he sees infectious disease at [facility] next week the following week on Wednesday I believe he has an appointment with [name] at [facility] who is his vascular cardiologist. Finally he has an appointment with Dr. Doran Guzman on 11/22/16 we have been using silver alginate. The patient's wife states they are having trouble getting this through [facility] 11/13/16; the patient was seen by infectious disease at [facility] in the 11th PICC line placed in preparation for IV antibiotics. A tummy he has not going to get IV  vancomycin o Ceftaroline. They've also ordered an MRI. Patient has a follow-up with Dr. Doran Guzman on 11/22/16 and [name] at [facility] tomorrow 11/23/16 the patient is on daptomycin as directed by infectious disease at [facility]. He is also been back to see [name] at [facility]. He underwent a repeat arteriogram. He had a successful PTA of the right anterior tibial artery. He is on dual antiplatelete treatment with Plavix and aspirin. Finally he had the MRI of his foot in [place]. This showed cellulitis about the foot worse distally edema and enhancement in the reminiscent of the second metatarsal was consistent with osteomyelitis therefore what I was assuming to be the first metatarsal may be actually the second. He also has a fluid collection deep to the calcaneus at the level of the calcaneal spur which could be an abscess or due to adventitial bursitis. He had a small tear in his Achilles 11/30/16; the patient continues on daptomycin as directed by infectious disease at [facility]. He is been revascularized by [name] at [facility] in [place]. He has been to see Gretta Arab who was the orthopedic surgeon who did his original amputation. I have not seen his not however per the patient's wife he did not offer another surgical local surgical prodecure to remove involved bone. He verbalized his usual disbelief in not just hyperbarics but any medical therapy for this condition(osteomyelitis). I discussed this in detail with the patient today including answering the question about a BKA definitively "curing" the current condition. 12/07/16; the patient continues on daptomycin as directed by infectious disease at [facility]. This is directed at the MRSA that we cultured from his bone debridement from 12/22. Lab work today shows a white count of 8.7 hemoglobin of 10.5 which is microcytic and hypochromic differential count shows a slightly elevated monocyte count at 1.2 eosinophilic count of 0.6. His  creatinine is 1.11 sedimentation rate apparently is gone from 35-34 now 40. I explained was wife I don't think this represents a trend. His total CK is 48 12/14/16- patient is here for follow-up evaluation of his right TMA site. He continues to receive IV daptomycin per infectious disease. His serum inflammatory markers remain elevated. He complains of intermittent pain to the medial aspect of the TMA site with intermittent erythema. He voices no complaints or concerns regarding hyperbaric therapy. Overall he and his wife are expressing a frustration and discouragement regarrding the length of time of treatment. 12/21/16; small open wound at roughly the first or second metatarsal metatarsalphalyngeal joint reminiscence of his transmetatarsal amputation site he continues to receive IV daptomycin per infectious disease at [facility]. He will finish these a week tomorrow. He has lab work which I been copied on. His white count is 10.8 hemoglobin 8.9 MCV is low at 75., MCH low at 24.5 platelet count slightly elevated at 626. Differential count shows 70% neutrophils 10% monocytes and 10% eosinophils. His  comprehensive metabolic panel shows a slightly low sodium at 133 albumin low at 3.1 total CK is normal at 42 sedimentation rate is much higher at 82. He has had iron studies that show a serum iron of 15 and iron binding capacity of 238 and iron saturation of 6. This is suggestive of iron deficiency. B12 and folate were normal ferritin at 113 The patient tells me that he is not eating well and he has lost weight. He feels episodically nauseated. He is coughing and gagging on mucus which she thinks is sinusitis. He has an appointment with his primary doctor at 5:00 this afternoon in [facility] 01/02/17; the patient developed a subacute pneumonitis. He was admitted to [facility] after a CT scan showed an extensive interstitial pneumonitis [I have not yet seen this]. 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 He has since been discharged. He is not on oxygen. He feels fatigued and very short of breath with exertion. At [facility] the wound care nurse there felt that his wound was healed. He did complete his daptomycin and has follow-up with infectious disease on Friday. X-rays I did before he went to [facility] still suggested residual osteomyelitis in the anterior aspect of the second must metatarsal head. I'm not sure I would've expected any different. There was no other findings. I actually think I did this because of erythema over the first metatarsal head reminiscent 01/11/17; the patient has eosinophilic pneumonitis. He has been reviewed by pulmonology and given clearance for hyperbarics at least that's what his wife says. I'll need to see if there is note in care everywhere. Apparently the prognosis for improvement of daptomycin induced eosinophilic granulocyte is is 3 months without steroids. In the meantime infectious disease has placed him on doxycycline until the wound is closed. He is still is lost a lot of weight and his blood sugars are running in the mid 60s to low 80s fasting and at all times during the day 01/18/17; he has had adjustments in his insulin apparently his blood sugars in the morning or over 100. He wants to restart his hyperbaric treatment we'll do this at 1:00. 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 I think there is good reason to complete his treatments in order to give him the best chance of maintaining a healed status and these DFU 3 wounds with MRSA infection in the bone 02/15/17; the patient was seen today in conjunction with HBO. He completed hyperbaric oxygen today. The open area on his transmetatarsal site has remained closed. There was an area of erythema when I saw him  earlier in the week on the posterior heel although that is resolved as of today as well. He has been using a cam walker. This is a patient who came to [place] after a transmetatarsal amputation that was necrotic and dehisced. He required revascularization percutaneously on 2 different occasions by [name] of invasive cardiology at [facility]. 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 He went to [facility] infectious disease and had IV daptomycin for 5 weeks before developing eosinophilic pneumonitis and requiring an admission to hospital/ICU. He made a good recovery and is continued on doxycycline since. As mentioned his foot is closed now. He has a follow-up with [name] tomorrow. He is going  to [employer] on Monday for a custom-made shoe READMISSION Last visit [name] [facility] 02/16/17 1. Critical limb ischemia of the RLE: s/p transmetatarsal amputation of the RLE, now completely healed. s/p right ATA percutaneous revascularization procedure x2, most recently 11/20/2016 s/p PTA of right AT 100% to less than 20% with a 2.5 x 200 balloon. He does have some residual osteomyelitis, but given the wound is closed, the orthopedist has recommended to follow. He has a fitting for a special shoe coming up next week. He has completed a course of daptomycin and doxycycline and he has been discharged by ID. He has completed a course of hyperbaric therapy. Continue Continue medical management with aspirin, Plavix and statin therapy. We will discuss ongoing Plavix therapy at follow-up in 6 months. 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 Given no symptoms, we will conservatively manage. He doesn't want to do any more invasive studies at this time, which is reasonable. The patient  arrives today out of 2 concerns both on the right transmetatarsal site. 1 at the level of the reminiscent fifth metatarsal head and the other at roughly the first or second. Both of these look like dark subcutaneous discoloration probably subdermal bleeding. He is recently obtained new adaptive footwear for the right foot. He also has a callus on the left fifth dorsal toe however he follows with podiatry for this and I don't think this is any issue. ABIs in this clinic today were 0.66 on the right and 1.06 on the left. On entrance into our clinic initially this was 0.95 and 0.92. He does not describe current claudication. They're going away on a cruise in 8 weeks and I think are trying to do the month is much as they can proactively. They follow with podiatry and have an appointment with [name] in October READMISSION 06/25/18 This is a patient that we have not seen in almost a year. He is a type II diabetic with known PAD. He is followed by [name] of interventional cardiology at [facility] in [place]. He is required revascularization for significant PAD. When we first saw him he required a transmetatarsal amputation. He had underlying osteomyelitis with a nonhealing surgical wound. This eventually closed with wound care, IV antibiotics and hyperbaric oxygen. They tell me that he has a modified shoe and he is very active walking up to 4 miles a day. He is followed by Dr. Geroge Baseman of podiatry. His wife states that he underwent a removal of callus over this site on July 9. She felt there may be drainage from this site after that although she could never really determined and there was callus buildup again. On 06/01/18 there was pressure bleeding through the overlying callus. he was given a prescription for 7 days of Bactrim. Fortuitously he has an appointment with [name] on 06/04/18 and he immediately underwent revascularization of the right leg although I have not had a chance to review these  records in care everywhere. This was apparently done through anterior and retrograde access. On 06/06/18 he had another debridement by Dr. Bernette Mayers. Vitamin soaking with Epsom salts for 20 minutes and applying calcium alginate. The original trans-met was on April 2017 I believe by Dr. Doran Guzman. His ABI in our clinic was noncompressible today. 07/02/18; x-ray I ordered last week was negative for osteomyelitis. Swab culture was also negative. He is going to  require an MRI which I have ordered today. 07/09/18; surprisingly the MRI of the foot that I ordered did not show osteomyelitis. He did suggest the possibility of cellulitis. For this reason I'll go ahead and give him a 10 day course of doxycycline. Although the previous culture of this area was negative 07/16/18; it arrives with the wound looking much the same. Roughly the same depth. He has thick subcutaneous tissue around the wound orifice but this still has roughly the same depth. Been using silver alginate. We applied Oasis #1 today 07/23/2018; still having to remove a lot of callus and thick subcutaneous tissue to actually define the wound here. Most of this seems to have closed down yet he has a comma shaped divot over the top of the area that still I think is open. We applied Oasis #2 His wife expressed concern about the tip of his left great toe. This almost looks like a small blister. She also showed it today to her podiatrist Dr. Geroge Baseman who did not think this was anything serious. I am not sure is anything serious either however I think it bears some watching. He is not in any pain however he is insensate 07/30/2018;; still on a lot of nonviable tissue over the surface of the wound however cleaning this up reveals a more substantial wound orifice but less of a probing wound depth. This is not probed to bone. There is no evidence of infection The wife is still concerned about a non-open area on the tip of his left great toe. Almost feels like a  bony outgrowth. She had previously showed this to podiatry. I do not think this is a blister. A friction area would be possible although he is really not walking according to his wife. He had a small skin tag on his right buttock but no open wound here either 08/06/2018; we applied a third Oasis last week. Unfortunately there is really no improvement. Still requiring extensive debridement to expose the wound bed from a horizontal slitlike depression. I still have not been able to get this to fill in properly. On the positive side there is now no probable bone from when he first came into the facility. I changed him to silver alginate today after a reasonably aggressive debridement 08/13/2018; once again the patient comes in with skin and subcutaneous tissue closing over the small probing area with the underlying cavity of the wound on the right TMA site. I applied silver alginate to this last week. Prior to that we used Oasis x3 still not able to get this area granulating. He does not have a probing area of the bone which is an improvement from when I spur started working on this and an MRI did not suggest osteomyelitis. I do not see evidence of infection here but I am increasingly concerned about why I cannot get this area to granulate. Each time I debrided this is looks like this is simply a matter of getting granulation to fill in the hole and then getting epithelialization. This does not seem to happen Also I sent him back to see podiatry Dr. Earleen Newport about the what felt to be bony outgrowth on the tip of his left great toe. Apparently after heel he left the clinic last week or the next day he developed a blood blister. He did see Dr. Earleen Newport. He went on to have a debridement of the medial nail cuticle he now has an open area here as well as some denuded skin. They  did an x-ray apparently does have a bony outgrowth or spur but I am not able to look at this. They are using topical antibiotics  apparently there was some suggested he use Santyl. Patient's wife was anxious for my opinion of this 08/20/2018; we are able to keep the wound open this time instead of the thick subcutaneous tissue closing over the top of it however unfortunately once again this probes to bone. I do not see any evidence of infection and previous MRI did not show osteomyelitis. I elected to go back to the Oasis to see if we can stimulate some granulation. Last saw his vascular interventional cardiologist [name] at the beginning of August and he had a repeat procedure. Nevertheless I wonder how much blood flow he has down to this area With regards to the left first toe he is seeing Dr. Earleen Newport next week. They are applying Bactroban to this area. 08/27/2018 Once again he comes in with thick eschar and subcutaneous tissue over the top of the small probing hole. This does not appear to go down to bone but it still has roughly the same depth. I reapplied Oasis today Over the left great toe there appears to be more of the wound at the tip of his toe than there was last week. This has an eschar on the surface of it. Will change to Santyl. There is seeing podiatry this afternoon 09/03/2018 He comes in today with the area on the transmetatarsal's site with a fair amount of callus, nonviable tissue over the circumference but it was not closed. I removed all of this as well as some subcutaneous debris and reapplied Oasis. There is no exposed bone The area over the tip of the left great toe started off as a nodule of uncertain etiology. He has been followed with podiatry. They have been removing part of the medial nail bed. He has nonviable tissue over the wound he been using Santyl in this area 09/10/18 Unfortunately comes in with neither wound area looking improved. The transmetatarsal amputation site once Again has nonviable debris over the surface requiring debridement. Unfortunately underneath this there is  nothing that looks viable and this once again goes right down to bone. There is no purulent drainage and no erythema. Also the surgical wound from podiatry on the left first toe has an ischemic-looking eschar over the surface of the tip of the toe I have elected not to attempt candidly debride this His wife as arranged for him to have follow-up noninvasive studies in [place] under the care of [name] clinic. The question is he has known severe PAD. They had recently seen him in August. He has had revascularizations in both legs within the last 4 or 5 months. He is not complaining of pain and I cannot really get a history of claudication. He has not systemically unwell 09/20/2018 the patient has been to [facility] and been revascularized by [name] earlier this week. Apparently he was able to open up the anterior tibial artery although I have not actually seen his formal report. He is going for an attempt to revascularize on the left on Monday. He is apparently working with an investigational stent for lower extremity arteries below the knee and he has talked to the patient about placing that on Monday if possible. The patient has been using silver alginate on the transmetatarsal amputation site and Santyl on the left 09/30/2018; patient had his revascularization on the left this apparently included a standard approach  as well as a more distal arterial catheterization although I do not have any information on this from [name]. In fact I do not even see the initial revascularization that he had on the right. I have included the arterial history from [name] last note however below; ASSESSMENT/PLAN: 1. Hx of Critical limb ischemia bilateral lower extremities, PAD: -s/p transmetatarsal amputation of the RLE. -s/p right ATA percutaneous revascularization procedure x2, most recently 11/20/2016 s/p PTA of right AT 100% to less than 20% with a 2.5 x 200 balloon. -On original angiogram  05/15/2016, he had a significant 70-95% left popliteal artery stenosis with AT and PT artery occlusions and one vessel runoff via the peroneal artery. -03/21/2018 s/p PTA of 90% left popliteal artery to <10% with a 5x20 cutting balloon, PTA of 90% left peroneal to <20% with a 3x20 balloon, PTA of 100% left AT to <20% with a 2.5x220 balloon. 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 Will plan on the LLE soon after. Risks/benefits of procedure have been discussed and patient has elected to proceed. We have been using endoform to the right TMA amputation site wound and Santyl to the left great toe 10/07/2018; the area on the tip of his left great toe looked better we have been using Santyl here. We continue to have a very difficult probing hole on the right TMA amputation site we have been using endoform. I went on to use his fifth Oasis today 10/14/2018; the tip of the left great toe continues to look better. We have been using Santyl here the surface however is healthy and I think we can change to an alginate. The right TMA has not changed. Once again he has no superficial opening there is callus and thick subcutaneous tissue over the orifice once you remove this there is the probing area that we have been dealing with without too much change. There is no palpable bone I have been placing Oasis here and put Oasis #6 in this today after a more vigorous debridement 10/21/18; the left great toe still has necrotic surface requiring debridement. The right TMA site hasn't changed in view of the thick callus over the wound bed. With removal of this there is still the opening however this does not appear to have the same depth. Again there is no palpable bone. Oasis was replaced 10/28/2018; patient comes in with both wounds looking worse. The area over the  first toe tip is now down to bone. The area over the TMA site is deeper and down to bone clearly with a increase in overall wound area. Equally concerning on the right TMA is the complete absence of a pulse this week which is a change. We are not even able to Doppler this. On the right he has a noncompressible ABI greater than 1.4 11/04/2018. Both wounds look somewhat worse. X-rays showed no osteomyelitis of the right foot but on the left there was underlying osteomyelitis in the left great toe distal phalanx. I been on the phone to [name] surface at [facility] in [place] and they are arranging for another angiogram on the right on Monday. I have him on doxycycline for the osteomyelitis in the left great toe for now. Infectious disease may be necessary 1/17; 2-week hiatus. Patient was admitted to hospital at [place]. My understanding is he underwent an angioplasty of  the right anterior tibial artery and had stents placed in the left anterior artery and the left tibial peroneal trunk. This was done by [name] of interventional radiology. There is no major change in either 1 of the wounds. They have been using Aquacel Ag. As far as they are aware no imaging studies were done of the foot which is indeed unfortunate. I had him on doxycycline for 2 weeks since we identified the osteomyelitis in the left great toe by plain x-ray. I have renewed that again today. I am still suspicious about osteomyelitis in the amputation site and would consider doing another MRI to compare with the one done in September. The idea of hyperbaric oxygen certainly comes up for discussion 1/24; no major change in either wound area. I have him on doxycycline for osteomyelitis at the tip of the left great toe. As noted he has been previously and recently revascularized by [name] at [place]. He is tolerating the doxycycline well. For some reason we do not have an infectious disease consult yet. Culture of drainage from  the right foot site last week was negative 1/31; MRI of the right foot did not show osteomyelitis of the right ankle and foot. Notable for a skin ulceration overlying the second metatarsal stump with generalizing soft tissue edema of the ankle and foot consistent with cellulitis. Noted to have a partial-thickness tear of the Achilles tendon 7.5 cm proximal to the insertion Nothing really new in terms of symptoms. Patient's area on the tip of the left great toe is just about closed although he still has the probing area on the metatarsal amputation site. Appointment with Dr. Linus Salmons of infectious disease next week 2/7; Dr. Novella Olive did not feel that any further antibiotics were necessary he would follow-up in 2 months. The left great toe appears to be closed still some surface callus that I gently looked under high did not see anything open or anything that was threatening to be open. He still has the open area on the mid part of his TMA site using endoform 2/14; left great toe is closed and he is completing his doxycycline as of last Sunday. He is not on any antibiotics. Unfortunately out of the right foot his wife noticed some subdermal hemorrhage this week. He had been walking 2 miles I had given him permission to do so this is not really a plantar wound. Using endoform to this wound but I changed to silver alginate this week 2/21; left great toe remains closed. Culture last week grew Streptococcus angiosis which I am not really familiar with however it is penicillin sensitive and I am going to put him on Augmentin. Not much change in the wound on the right foot the deep area is still probing precariously close to bone and the wound on the margin of the TMA is larger. 2/28; left great toe remains closed. He is completing the Augmentin I gave him last week. 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 This is being shown to [name] at [place] to see if there  is anything else that can be done here. He has been using silver alginate strips on the right 3/6; left great toe remains closed. He sees [name] on Monday. The area on the plantar aspect of the right foot has the same small orifice with thick callused tissue around this. However this time with removal of the callus tissue the wound is open all the way along the  incision line to the end medially. We have been using silver alginate. 3/13; left toe remains closed although the area is callused. He sees Dr. Jacqualyn Posey of podiatry next week. The area on the plantar right foot looked a lot better this week. Culture I did of this was negative we use silver alginate. He is going next week for an attempt at revascularization by [name] 3/23; left toe remains closed although the area is callused. He will see Dr. Jacqualyn Posey in follow-up. The area on the right plantar foot continues to look surprisingly better over the last 3 visits. We have been using silver alginate. The revascularization he was supposed to have by [name] at [facility] in [place] has been canceled [facility] procedure] 4/6; the right foot remains closed albeit callused. Podiatry canceled the appointment with regards to the left great toe. He has not seen [name] at [facility] but thinks that [name] has "done all he can do". He would be a candidate for the hemostaemix trial Readmission 07/29/2019 Mr. Stann Mainland is a man we know well from at least 3 previous stays in this clinic. He is a type II diabetic with severe PAD followed by [name] at [facility] in [place]. 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 So far everything in both of these areas has remained closed. About 2 weeks ago he went to see his podiatrist at [facility] Dr. Babs Bertin. He had a thick callus on the left fifth metatarsal head that was  shaved. He has developed an open wound in this area. They have been offloading this in his diabetic shoes. The patient has not had any more revascularizations by [name] since the last time he was here. ABI in our clinic at the posterior tibial on the left was 1.06 10/6; no real change in the area on the plantar met head. Small wound with 2 mm of depth. He has thick skin probably from pressure around the wound. We have been using silver alginate 10/13; small wound in the left fifth plantar met head. Arrives today with undermining laterally and purulent drainage. Our intake nurse cultured this. His wife stated they noticed a change in color over the last day or 2. He is not systemically unwell 10/19; small wound on the fifth plantar metatarsal head. Culture I did last week showed Staphylococcus lugdunensis. Although this could be a skin contaminant the possibility of a skin and soft tissue infection was there. I did give him empiric doxycycline which should have covered this. We are using silver alginate to the wound. We put him in a total contact cast today. 10/22; small wound on the fifth plantar metatarsal head. He has completed antibiotics. He also has severe PAD which worries me about just about any wound on this man's foot 10/29; small superficial area on the fifth plantar metatarsal head. Measuring slightly smaller. He is not currently on any antibiotics. I been using a total contact cast in this man with very severe PAD but he seems to be tolerating this well 11/5; left plantar fifth metatarsal head. Silver alginate being used under a total contact cast 11/12; wound not much different than last week. Using a #15 scalpel debridement around the wound. Still silver collagen under a total contact cast. He has severe PAD and that may be playing a role in  this 11/19; disappointing that the wound is not really changed that much. I think it is come down in overall surface area because  originally this was on the lateral part of the fifth metatarsal head however recently it is not really changed. Most of this is filled in but it will not epithelialized there is surface debris on this which may be mostly related to ischemia. They have an appointment with [name] on 12/9 09/24/2019 on evaluation today patient appears to be doing somewhat better with regard to the wound on the left fifth metatarsal head. Fortunately there does not appear to be any signs of active infection at this time. No fevers, chills, nausea, vomiting, or diarrhea. The wound does not appear to be completely closed and I do feel like the Hydrofera Blue is helping to some degree although I feel like the cast as well as keeping things from breaking down or getting any larger at least. As far as his wound overview it shows that the overall size of the wound is slightly smaller but really maintaining within the realm of about the same. He had no troubles with the cast which is good news. 12/1; left fifth metatarsal head. Perhaps somewhat more vibrant. We have been using Hydrofera Blue. He sees [name] at [place] 1 week tomorrow. He be back next week and I hope to have a better idea which way the wound is going however I will not cast him next week in case [name] wants to reevaluate things in his foot. The left leg is the leg with the stent in place and I wonder whether these are going to have to be reevaluated. 12/8; left fifth metatarsal head. Quite a bit better this week. Only a small open area remains. He sees [name] at [place] tomorrow. [name] is previously done his vascular interventions. He has 2 stents in the left leg as I remember things. I therefore will not put him in a total contact cast today. He will be using a forefoot off loader. We will use silver collagen on the wound. We will see him again next week 12/15; left fifth metatarsal head. He saw [name] and is scheduled for an angiogram on  Thursday. Unfortunately although his wound was a lot better last week it is really deteriorated this week. Small punched-out hole with depth and overhanging tissue. We have been using Hydrofera Blue 12/22; patient had an 80% stenosis proximal to the stent I believe in the below-knee popliteal artery. This was opened by [name]. According the patient the stent itself was satisfactory. I have not been able to review this note. 12/29; patient arrives today with the wound about the same size some undermining medially. We have been using Hydrofera Blue under a total contact cast and that is what we will do again today 11/04/2019. Slightly smaller in orifice but about 4 mm undermining almost circumferentially. We have been using Hydrofera Blue. Apparently the Hydrofera Blue did not stay on the wound after we removed the cast. Some minor looking cast irritation on the right lateral 1/12; unfortunately things did not go well this week. Arrives in clinic today with a deeper wound with undermining more concerning a area of pus. Specimen was obtained for culture. We are not going to be able to put him in a cast today. 1/19; purulent material last week which I cultured showed Enterobacter cloacae. He was on ampicillin previously prescribed by his primary doctor which should not have covered this  however mysteriously the wound looks somewhat better. There is less depth less erythema certainly no drainage. He will need a course of ciprofloxacin which I will provide today. 1/26; he has completed the ciprofloxacin. I don't think he needs any additional antibiotics. The areas on the left fifth plantar met head. We've been using silver alginate 2/2; comes in today with 0.4 cm of circumferential undermining around a small wound in this area. We have been using silver alginate with a forefoot offloading boot 2/9; again the wound appears larger nonviable surface and with 0.4 cm roughly of circumferential  undermining. We have been using silver alginate with a forefoot offloading boot. The wound does not look infected however his wife is quick to point out about swelling on the dorsal foot. 2/16; not much change. Comes in with a small wound with a nonviable necrotic surface. Again marked undermining that I had totally removed last week. He had a arterial Doppler last week in [name] office at [facility]. They have not heard these results. They are somewhat frustrated. 2/23; if anything this is worse. Undermining increased depth. Nonviable surface that looks pale. According his wife is TBI on the right was 0.22 although I have not verified this. He is going for an angiogram with [name] next Monday. We are using polymen and offloading in a forefoot offloading boot. 3/2; the patient was revascularized yesterday by [facility]. He had successful PTA of the left peroneal 60% to less than 20% and successful PTA of the left ATA 100% to less than 20%. He now has a pulse in the left foot Now that he has some additional blood flow there is not a lot of options here. On one hand we want activity to help keep blood flow augmented on the other hand pressure relief is going to be paramount if we have any chance to get this to close. After some discussion with his wife and patient I went ahead and put him in a total contact cast. 3/9; he arrives in clinic today with some debris over the wound surface. We use silver collagen under a total contact cast after his revascularization by [name] 3/16; wound about the same depth at 0.5 cm. Surface area perhaps slightly less but it is the depth the really is important. We have been using silver collagen 3/23; not much change here. There is still undermining medially the depth is about the same tissue looks somewhat better. We switched to Iodoflex last week to try to get a better looking wound surface. He does have a small abrasion over the proximal  interphalangeal joint dorsally. I this is so small it is difficult even to tell whether it is open. Some abrasion between the fifth toe and the webspace as well 3/30; if anything the wound is deeper this week and larger. Very disappointing. Under illumination debris on the surface which I debrided gently with a #3 curette there is minimal bleeding. We had been using iodoflex under a total contact cast. I am going to take him out of the cast and change him to [place] today which his wife will change daily. 4/6; wound bed looks better. Still undermining medially. Some maceration and erythema around the wound. We have been using Santyl change daily. I took him out of a total contact cast last week Electronic Signature(s) Signed: 02/03/2020 6:13:52 PM By: [name] MD Entered By: [name] on 02/03/2020 13:51:44 -------------------------------------------------------------------------------- Physical Exam Details Patient Name: Date of Service: Corine Shelter,  [place] 02/03/2020 12:30 PM Medical Record [id number] Patient Account Number: [id number] Date of Birth/Sex: Treating RN: [birth year] (73 y.o. [name]) [name] Primary Care Provider: Rory Guzman Other Clinician: Referring Provider: Treating Provider/Extender:Enaya Howze, [name], [name] in Treatment: 27 Constitutional Sitting or standing Blood Pressure is within target range for patient.. Pulse regular and within target range for patient.Marland Kitchen Respirations regular, non-labored and within target range.. Temperature is normal and within the target range for the patient.Marland Kitchen Appears in no distress. Cardiovascular Dorsalis pedis pulses palpable. Edema present in the left leg. Warmth and tenderness noted. Significant edema in the dorsal foot. Notes Wound exam; no improvement in surface area perhaps slightly less deep. . There is no palpable bone. Post debridement culture done Electronic Signature(s) Signed: 02/03/2020 6:13:52 PM By:  [name] MD Entered By: [name] on 02/03/2020 13:54:33 -------------------------------------------------------------------------------- Physician Orders Details Patient Name: Date of Service: DADE, RODIN 02/03/2020 12:30 PM Medical Record [id number] Patient Account Number: [id number] Date of Birth/Sex: Treating RN: 02/05/1946 (73 y.o. [name] Primary Care Provider: Rory Guzman Other Clinician: Referring Provider: Treating Provider/Extender:Gerlene Glassburn, [name], [name] in Treatment: 27 Verbal / Phone Orders: No Diagnosis Coding ICD-10 Coding Code Description E11.621 Type 2 diabetes mellitus with foot ulcer E11.51 Type 2 diabetes mellitus with diabetic peripheral angiopathy without gangrene L97.521 Non-pressure chronic ulcer of other part of left foot limited to breakdown of skin Follow-up Appointments Return Appointment in 1 week. Dressing Change Frequency Wound #5 Left Metatarsal head fifth Change dressing every day. Wound Cleansing Wound #5 Left Metatarsal head fifth May shower and wash wound with soap and water. Primary Wound Dressing Wound #5 Left Metatarsal head fifth Santyl Ointment Other: - pad right great toe for protection Secondary Dressing Dry Gauze Foam [facility] Bacteria identified in Unspecified specimen by Anaerobe culture (MICRO) - non healing wound - (ICD10 E11.621 - Type 2 diabetes mellitus with foot ulcer) LOINC Code: 297-9 Convenience Name: Anerobic culture Services and Therapies Venous Duplex Doppler, left lower leg - left lower leg swelling - rule out DVT - STAT Patient request it to be done at [facility] - (ICD10 E11.621 - Type 2 diabetes mellitus with foot ulcer) Electronic Signature(s) Signed: 02/03/2020 6:13:52 PM By: [name] MD Signed: 02/03/2020 6:43:44 PM By: Todd Coria RN Entered By: [name] on 02/03/2020  13:48:22 -------------------------------------------------------------------------------- Prescription 02/03/2020 Patient Name: [name] Provider: [name] MD Date of Birth: 1946-10-14 NPI#: [id number] Sex: M DEA#: [id number] Phone #: [phone] License #: [id number] Patient Address: [address] [place], [place] [address] [place], [place] [address] [phone] Allergies daptomycin Reaction: pneumonia Severity: Severe Provider's Orders Venous Duplex Doppler, left lower leg - ICD10: E11.621 - left lower leg swelling - rule out DVT - STAT Patient request it to be done at [facility]): Date(s): Electronic Signature(s) Signed: 02/03/2020 6:13:52 PM By: [name] MD Signed: 02/03/2020 6:43:44 PM By: Todd Coria RN Entered By: [name] on 02/03/2020 13:48:23 --------------------------------------------------------------------------------  Problem List Details Patient Name: Date of Service: ANDRA, MATSUO 02/03/2020 12:30 PM Medical Record [id number] Patient Account Number: [id number] Date of Birth/Sex: Treating RN: 03/08/1946 (73 y.o. [name] Primary Care Provider: Rory Guzman Other Clinician: Referring Provider: Treating Provider/Extender:Alejandria Wessells, [name], [name] in Treatment: 27 Active Problems ICD-10 Evaluated Encounter Code Description Active Date Today Diagnosis E11.621 Type 2 diabetes mellitus with foot ulcer 07/29/2019 No Yes E11.51 Type 2 diabetes mellitus with diabetic peripheral 07/29/2019 No Yes  angiopathy without gangrene L97.521 Non-pressure chronic ulcer of other part of left foot 07/29/2019 No Yes limited to breakdown of skin Inactive Problems ICD-10 Code Description Active Date Inactive Date L03.116 Cellulitis of left lower limb 08/12/2019 08/12/2019 Resolved Problems Electronic Signature(s) Signed: 02/03/2020 6:13:52 PM By: [name]  MD Entered By: [name] on 02/03/2020 13:50:46 -------------------------------------------------------------------------------- Progress Note Details Patient Name: Date of Service: HEIDI, LEMAY 02/03/2020 12:30 PM Medical Record [id number] Patient Account Number: [id number] Date of Birth/Sex: Treating RN: 07/12/1946 (73 y.o. [name] Primary Care Provider: Rory Guzman Other Clinician: Referring Provider: Treating Provider/Extender:Ronin Crager, [name], [name] in Treatment: 27 Subjective History of Present Illness (HPI) 05/18/16; this is a 74year-old diabetic who is a type II diabetic on insulin. The history is that he traumatized his right foot developed a sore sometime in late March. Shortly thereafter he went on a cruise but he had to get off the cruise ship in [place] and fly urgently back to [place] where [facility] [facility] and ultimately underwent a transmetatarsal amputation by Dr. 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 According to the patient and his wife this wound never really healed. He was seen on 2 occasions in the wound care center in [place] and had vascular studies and then was referred urgently to Dr. Bridgett Guzman of vascular surgery. He underwent an angiogram on 05/10/16. Unfortunately nothing really could be done to improve his vascular status. He had a 75-90% stenosis in the midsegment of 1 segment of the posterior femoral artery. He had a patent popliteal, his anterior tibial occluded shortly after takeoff. Perineal had a greater than 90% stenosis posterior tibial is occluded feet had no distal collaterals feed distal aspect of the transmetatarsal amputation site. The patient tells me that he had a prolonged period of Santyl by Dr. Doran Guzman was some initial improvement but then this was stopped. I think they're only applying daily dressings/dry dressings. He has not had a recent x-ray of the right foot he did have one before  his surgery in April. His wife by the dimensions of the wound/surgical site being followed at home since 4/20. At that point the dimensions were 0.5 x 12 x 0.2 on 7/19 this was 1.8 x 6 x 0.4. He is not currently on any antibiotics. His hemoglobin A1c in early April was 12.9 at that point he was started on insulin. Apparently his blood sugars are much lower he has an appointment with [name] next week. 05/29/16 x-ray of the area did not show osteomyelitis. I think he probably needs an MRI at this point. His wife is asking about something called"Yireh" cream which is not FDA approved. I have not heard of this. 06/22/16; MRI did not really suggest osteomyelitis. There was minimal marrow edema and enhancement in the stump of the second metatarsal felt to be secondary likely to postoperative change rather than osteomyelitis. The patient has arranged his own consultation with [name] at [place], apparently their daughter lives in [place] and has some connection here. Any improvement in vascular supply by [name] would of course be helpful. Dr. Bridgett Guzman did not feel that anything further could be done other than amputation if wound care did not result in healing or if the area deteriorates. 06/26/16; the patient has been to see [name] at [facility] and had an angiogram. He is going for a procedure on Thursday which will involve catheterization. I'm not sure if this is an anterograde or retrograde  approach. He has been using Santyl to the wound 07/10/16; the patient had a repeat angiogram and angioplasty at [facility] by Dr. Brunetta Guzman. His angiogram showed right CFA and profundal widely patent. The right as of a.m. popliteal artery were widely patent the right anterior tibial was occluded proximally and reconstitutes at the ankle. Peroneal artery was patent to the foot. Posterior tibial artery was occluded. The patient had angioplasty of the anterior tibial artery.. This was quite successful. He  was recommended for Plavix as well as aspirin. 07/17/16; the patient was close to be a nurse visit today however the outer dressing of the Apligraf fell off. Noted drainage. I was asked to see the wound. The patient is noted an odor however his wife had noted that. Drainage with Apligraf not necessarily a bad thing. He has not been systemically unwell 07/24/16; we are still have an issue with drainage of this wound. In spite of this I applied his second Apligraf. Medially the area still is probing to bone. 08/07/16; Apligraf reapplied in general wound looks improved. 08/21/16 Apligraf #4. Wound looks much better 09/04/16 patientt's wound again today continues to appear to improve with the application of the Apligraf's. He notes no increased discomfort or concerns at this point in time. 09/18/16; the patient returns today 2 weeks after his fifth application of Apligraf. Predictably three quarters of the width of this wound has healed. The deep area that probe to bone medially is still open. The patient asked how much out-of-pocket dollars would be for additional Apligraf's. 09/25/16; now using Hydrofera Blue. He has completed 5 Apligraf applications with considerable improvement in this deep open transmetatarsal amputation site. His wound is now a triangular-shaped wound on the medial aspect. At roughly 12 to 2:00 this probes another centimeter but as opposed to in the past this does not probe to bone. The patient has been seen at [facility] by Dr. Zenia Guzman. He is not planning to do any more revascularization unless the wound stalls or worsens per the patient 10/02/16; 0.7 x 0.8 x 0.8. Unfortunately although the wound looks stable to improved. There is now easily probable bone. This hasn't been present for several weeks. Patient is not otherwise symptomatic he is not experiencing any pain. I did a culture of the wound bed 10/09/16. Deterioration last week. Culture grew MRSA and although there is improvement  here with doxycycline prescribed over the phone I'm going to try to get him linezolid 600 twice a day for 10 days today. 10/16/16; he is completing a weeks worth of linezolid and still has 3 more days to go. Small triangular-shaped open area with some degree of undermining. There is still palpable bone with a curet. Overall the area appears better than last week 10/20/16 he has completed the linezolid still having some nausea and vomiting but no diarrhea. He has exposed bone this week which is a deterioration. 10/27/16 patient now has a small but probing wound down to bone. Culture of this bone that I did last week showed a few methicillin-resistant staph aureus. I have little doubt that this represents acute/subacute osteomyelitis. The patient is currently on Doxy which I will continue he also completed 10 days of linezolid. We are now in a difficult situation with this patient's foot after considerable discussion we will send him back to see Dr. Doran Guzman for a surgical opinion of this I'm also going to try to arrange a infectious disease consult at [facility] hopefully week and get this prior to her usual 4-6  weeks we having Goodnight. The patient clearly is going to need 6 weeks of IV vancomycin. If we cannot arrange this expediently I'll have to consider ordering this myself through a home infusion company 11/03/16; the patient now has a small in terms of circumference but probing wound. No bone palpable today. The patient remains on doxycycline 100 twice a day which should support him until he sees infectious disease at [facility] next week the following week on Wednesday I believe he has an appointment with [name] at [facility] who is his vascular cardiologist. Finally he has an appointment with Dr. Doran Guzman on 11/22/16 we have been using silver alginate. The patient's wife states they are having trouble getting this through [facility] 11/13/16; the patient was seen by infectious disease at [facility] in the  11th PICC line placed in preparation for IV antibiotics. A tummy he has not going to get IV vancomycin o Ceftaroline. They've also ordered an MRI. Patient has a follow-up with Dr. Doran Guzman on 11/22/16 and [name] at [facility] tomorrow 11/23/16 the patient is on daptomycin as directed by infectious disease at [facility]. He is also been back to see [name] at [facility]. He underwent a repeat arteriogram. He had a successful PTA of the right anterior tibial artery. He is on dual antiplatelete treatment with Plavix and aspirin. Finally he had the MRI of his foot in [place]. This showed cellulitis about the foot worse distally edema and enhancement in the reminiscent of the second metatarsal was consistent with osteomyelitis therefore what I was assuming to be the first metatarsal may be actually the second. He also has a fluid collection deep to the calcaneus at the level of the calcaneal spur which could be an abscess or due to adventitial bursitis. He had a small tear in his Achilles 11/30/16; the patient continues on daptomycin as directed by infectious disease at [facility]. He is been revascularized by [name] at [facility] in [place]. He has been to see Gretta Arab who was the orthopedic surgeon who did his original amputation. I have not seen his not however per the patient's wife he did not offer another surgical local surgical prodecure to remove involved bone. He verbalized his usual disbelief in not just hyperbarics but any medical therapy for this condition(osteomyelitis). I discussed this in detail with the patient today including answering the question about a BKA definitively "curing" the current condition. 12/07/16; the patient continues on daptomycin as directed by infectious disease at [facility]. This is directed at the MRSA that we cultured from his bone debridement from 12/22. Lab work today shows a white count of 8.7 hemoglobin of 10.5 which is microcytic and hypochromic  differential count shows a slightly elevated monocyte count at 1.2 eosinophilic count of 0.6. His creatinine is 1.11 sedimentation rate apparently is gone from 35-34 now 40. I explained was wife I don't think this represents a trend. His total CK is 48 12/14/16- patient is here for follow-up evaluation of his right TMA site. He continues to receive IV daptomycin per infectious disease. His serum inflammatory markers remain elevated. He complains of intermittent pain to the medial aspect of the TMA site with intermittent erythema. He voices no complaints or concerns regarding hyperbaric therapy. Overall he and his wife are expressing a frustration and discouragement regarrding the length of time of treatment. 12/21/16; small open wound at roughly the first or second metatarsal metatarsalphalyngeal joint reminiscence of his transmetatarsal amputation site he continues to receive IV daptomycin per  infectious disease at [facility]. He will finish these a week tomorrow. He has lab work which I been copied on. His white count is 10.8 hemoglobin 8.9 MCV is low at 75., MCH low at 24.5 platelet count slightly elevated at 626. Differential count shows 70% neutrophils 10% monocytes and 10% eosinophils. His comprehensive metabolic panel shows a slightly low sodium at 133 albumin low at 3.1 total CK is normal at 42 sedimentation rate is much higher at 82. He has had iron studies that show a serum iron of 15 and iron binding capacity of 238 and iron saturation of 6. This is suggestive of iron deficiency. B12 and folate were normal ferritin at 113 The patient tells me that he is not eating well and he has lost weight. He feels episodically nauseated. He is coughing and gagging on mucus which she thinks is sinusitis. He has an appointment with his primary doctor at 5:00 this afternoon in [facility] 01/02/17; the patient developed a subacute pneumonitis. He was admitted to [facility] after a CT scan  showed an extensive interstitial pneumonitis [I have not yet seen this]. 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 He has since been discharged. He is not on oxygen. He feels fatigued and very short of breath with exertion. At [facility] the wound care nurse there felt that his wound was healed. He did complete his daptomycin and has follow-up with infectious disease on Friday. X-rays I did before he went to [facility] still suggested residual osteomyelitis in the anterior aspect of the second must metatarsal head. I'm not sure I would've expected any different. There was no other findings. I actually think I did this because of erythema over the first metatarsal head reminiscent 01/11/17; the patient has eosinophilic pneumonitis. He has been reviewed by pulmonology and given clearance for hyperbarics at least that's what his wife says. I'll need to see if there is note in care everywhere. Apparently the prognosis for improvement of daptomycin induced eosinophilic granulocyte is is 3 months without steroids. In the meantime infectious disease has placed him on doxycycline until the wound is closed. He is still is lost a lot of weight and his blood sugars are running in the mid 60s to low 80s fasting and at all times during the day 01/18/17; he has had adjustments in his insulin apparently his blood sugars in the morning or over 100. He wants to restart his hyperbaric treatment we'll do this at 1:00. 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 I think there is good reason to complete his treatments in order to give him the best chance of maintaining a healed status and these DFU 3 wounds with MRSA infection in the bone 02/15/17; the patient was seen today in conjunction with HBO. He completed hyperbaric oxygen today. The  open area on his transmetatarsal site has remained closed. There was an area of erythema when I saw him earlier in the week on the posterior heel although that is resolved as of today as well. He has been using a cam walker. This is a patient who came to [place] after a transmetatarsal amputation that was necrotic and dehisced. He required revascularization percutaneously on 2 different occasions by [name] of invasive cardiology at [facility]. 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  He went to [facility] infectious disease and had IV daptomycin for 5 weeks before developing eosinophilic pneumonitis and requiring an admission to hospital/ICU. He made a good recovery and is continued on doxycycline since. As mentioned his foot is closed now. He has a follow-up with [name] tomorrow. He is going to [employer] on Monday for a custom-made shoe READMISSION Last visit [name] [facility] 02/16/17 1. Critical limb ischemia of the RLE:  s/p transmetatarsal amputation of the RLE, now completely healed.  s/p right ATA percutaneous revascularization procedure x2, most recently 11/20/2016 s/p PTA of right AT 100% to less than 20% with a 2.5 x 200 balloon.  He does have some residual osteomyelitis, but given the wound is closed, the orthopedist has recommended to follow. He has a fitting for a special shoe coming up next week. He has completed a course of daptomycin and doxycycline and he has been discharged by ID. He has completed a course of hyperbaric therapy.  Continue Continue medical management with aspirin, Plavix and statin therapy. We will discuss ongoing Plavix therapy at follow-up in 6 months.  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 Given no symptoms, we will  conservatively manage. He doesn't want to do any more invasive studies at this time, which is reasonable. The patient arrives today out of 2 concerns both on the right transmetatarsal site. 1 at the level of the reminiscent fifth metatarsal head and the other at roughly the first or second. Both of these look like dark subcutaneous discoloration probably subdermal bleeding. He is recently obtained new adaptive footwear for the right foot. He also has a callus on the left fifth dorsal toe however he follows with podiatry for this and I don't think this is any issue. ABIs in this clinic today were 0.66 on the right and 1.06 on the left. On entrance into our clinic initially this was 0.95 and 0.92. He does not describe current claudication. They're going away on a cruise in 8 weeks and I think are trying to do the month is much as they can proactively. They follow with podiatry and have an appointment with [name] in October READMISSION 06/25/18 This is a patient that we have not seen in almost a year. He is a type II diabetic with known PAD. He is followed by [name] of interventional cardiology at [facility] in [place]. He is required revascularization for significant PAD. When we first saw him he required a transmetatarsal amputation. He had underlying osteomyelitis with a nonhealing surgical wound. This eventually closed with wound care, IV antibiotics and hyperbaric oxygen. They tell me that he has a modified shoe and he is very active walking up to 4 miles a day. He is followed by Dr. Geroge Baseman of podiatry. His wife states that he underwent a removal of callus over this site on July 9. She felt there may be drainage from this site after that although she could never really determined and there was callus buildup again. On 06/01/18 there was pressure bleeding through the overlying callus. he was given a prescription for 7 days of Bactrim. Fortuitously he has an appointment with [name] on  06/04/18 and he immediately underwent revascularization of the right leg although I have not had a chance to review these records in care everywhere. This was apparently done through anterior and retrograde access. On 06/06/18  he had another debridement by Dr. Bernette Mayers. Vitamin soaking with Epsom salts for 20 minutes and applying calcium alginate. The original trans-met was on April 2017 I believe by Dr. Doran Guzman. His ABI in our clinic was noncompressible today. 07/02/18; x-ray I ordered last week was negative for osteomyelitis. Swab culture was also negative. He is going to require an MRI which I have ordered today. 07/09/18; surprisingly the MRI of the foot that I ordered did not show osteomyelitis. He did suggest the possibility of cellulitis. For this reason I'll go ahead and give him a 10 day course of doxycycline. Although the previous culture of this area was negative 07/16/18; it arrives with the wound looking much the same. Roughly the same depth. He has thick subcutaneous tissue around the wound orifice but this still has roughly the same depth. Been using silver alginate. We applied Oasis #1 today 07/23/2018; still having to remove a lot of callus and thick subcutaneous tissue to actually define the wound here. Most of this seems to have closed down yet he has a comma shaped divot over the top of the area that still I think is open. We applied Oasis #2 His wife expressed concern about the tip of his left great toe. This almost looks like a small blister. She also showed it today to her podiatrist Dr. Geroge Baseman who did not think this was anything serious. I am not sure is anything serious either however I think it bears some watching. He is not in any pain however he is insensate 07/30/2018;; still on a lot of nonviable tissue over the surface of the wound however cleaning this up reveals a more substantial wound orifice but less of a probing wound depth. This is not probed to bone. There is no  evidence of infection The wife is still concerned about a non-open area on the tip of his left great toe. Almost feels like a bony outgrowth. She had previously showed this to podiatry. I do not think this is a blister. A friction area would be possible although he is really not walking according to his wife. He had a small skin tag on his right buttock but no open wound here either 08/06/2018; we applied a third Oasis last week. Unfortunately there is really no improvement. Still requiring extensive debridement to expose the wound bed from a horizontal slitlike depression. I still have not been able to get this to fill in properly. On the positive side there is now no probable bone from when he first came into the facility. I changed him to silver alginate today after a reasonably aggressive debridement 08/13/2018; once again the patient comes in with skin and subcutaneous tissue closing over the small probing area with the underlying cavity of the wound on the right TMA site. I applied silver alginate to this last week. Prior to that we used Oasis x3 still not able to get this area granulating. He does not have a probing area of the bone which is an improvement from when I spur started working on this and an MRI did not suggest osteomyelitis. I do not see evidence of infection here but I am increasingly concerned about why I cannot get this area to granulate. Each time I debrided this is looks like this is simply a matter of getting granulation to fill in the hole and then getting epithelialization. This does not seem to happen Also I sent him back to see podiatry Dr. Earleen Newport about the what felt to be bony  outgrowth on the tip of his left great toe. Apparently after heel he left the clinic last week or the next day he developed a blood blister. He did see Dr. Earleen Newport. He went on to have a debridement of the medial nail cuticle he now has an open area here as well as some denuded skin. They did an  x-ray apparently does have a bony outgrowth or spur but I am not able to look at this. They are using topical antibiotics apparently there was some suggested he use Santyl. Patient's wife was anxious for my opinion of this 08/20/2018; we are able to keep the wound open this time instead of the thick subcutaneous tissue closing over the top of it however unfortunately once again this probes to bone. I do not see any evidence of infection and previous MRI did not show osteomyelitis. I elected to go back to the Oasis to see if we can stimulate some granulation. Last saw his vascular interventional cardiologist [name] at the beginning of August and he had a repeat procedure. Nevertheless I wonder how much blood flow he has down to this area With regards to the left first toe he is seeing Dr. Earleen Newport next week. They are applying Bactroban to this area. 08/27/2018 ooOnce again he comes in with thick eschar and subcutaneous tissue over the top of the small probing hole. This does not appear to go down to bone but it still has roughly the same depth. I reapplied Oasis today ooOver the left great toe there appears to be more of the wound at the tip of his toe than there was last week. This has an eschar on the surface of it. Will change to Santyl. There is seeing podiatry this afternoon 09/03/2018 ooHe comes in today with the area on the transmetatarsal's site with a fair amount of callus, nonviable tissue over the circumference but it was not closed. I removed all of this as well as some subcutaneous debris and reapplied Oasis. There is no exposed bone ooThe area over the tip of the left great toe started off as a nodule of uncertain etiology. He has been followed with podiatry. They have been removing part of the medial nail bed. He has nonviable tissue over the wound he been using Santyl in this area 09/10/18 ooUnfortunately comes in with neither wound area looking improved. The transmetatarsal  amputation site once Again has nonviable debris over the surface requiring debridement. Unfortunately underneath this there is nothing that looks viable and this once again goes right down to bone. There is no purulent drainage and no erythema. ooAlso the surgical wound from podiatry on the left first toe has an ischemic-looking eschar over the surface of the tip of the toe I have elected not to attempt candidly debride this ooHis wife as arranged for him to have follow-up noninvasive studies in [place] under the care of [name] clinic. The question is he has known severe PAD. They had recently seen him in August. He has had revascularizations in both legs within the last 4 or 5 months. He is not complaining of pain and I cannot really get a history of claudication. He has not systemically unwell 09/20/2018 the patient has been to [facility] and been revascularized by [name] earlier this week. Apparently he was able to open up the anterior tibial artery although I have not actually seen his formal report. He is going for an attempt to revascularize on the left on Monday. He is  apparently working with an investigational stent for lower extremity arteries below the knee and he has talked to the patient about placing that on Monday if possible. The patient has been using silver alginate on the transmetatarsal amputation site and Santyl on the left 09/30/2018; patient had his revascularization on the left this apparently included a standard approach as well as a more distal arterial catheterization although I do not have any information on this from [name]. In fact I do not even see the initial revascularization that he had on the right. I have included the arterial history from [name] last note however below; ASSESSMENT/PLAN: 1. Hx of Critical limb ischemia bilateral lower extremities, PAD: -s/p transmetatarsal amputation of the RLE. -s/p right ATA percutaneous revascularization  procedure x2, most recently 11/20/2016 s/p PTA of right AT 100% to less than 20% with a 2.5 x 200 balloon. 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 -03/21/2018 s/p PTA of 90% left popliteal artery to <10% with a 5x20 cutting balloon, PTA of 90% left peroneal to <20% with a 3x20 balloon, PTA of 100% left AT to <20% with a 2.5x220 balloon. 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 Will plan on the LLE soon after. Risks/benefits of procedure have been discussed and patient has elected to proceed. We have been using endoform to the right TMA amputation site wound and Santyl to the left great toe 10/07/2018; the area on the tip of his left great toe looked better we have been using Santyl here. We continue to have a very difficult probing hole on the right TMA amputation site we have been using endoform. I went on to use his fifth Oasis today 10/14/2018; the tip of the left great toe continues to look better. We have been using Santyl here the surface however is healthy and I think we can change to an alginate. The right TMA has not changed. Once again he has no superficial opening there is callus and thick subcutaneous tissue over the orifice once you remove this there is the probing area that we have been dealing with without too much change. There is no palpable bone I have been placing Oasis here and put Oasis #6 in this today after a more vigorous debridement 10/21/18; the left great toe still has necrotic surface requiring debridement. The right TMA site hasn't changed in view of the thick callus over the wound bed. With removal of this there is still the opening however this does not appear to have the same depth.  Again there is no palpable bone. Oasis was replaced 10/28/2018; patient comes in with both wounds looking worse. The area over the first toe tip is now down to bone. The area over the TMA site is deeper and down to bone clearly with a increase in overall wound area. Equally concerning on the right TMA is the complete absence of a pulse this week which is a change. We are not even able to Doppler this. On the right he has a noncompressible ABI greater than 1.4 11/04/2018. Both wounds look somewhat worse. X-rays showed no osteomyelitis of the right foot but on the left there was underlying osteomyelitis in the left great toe distal phalanx. I been on the phone to  [name] surface at [facility] in [place] and they are arranging for another angiogram on the right on Monday. I have him on doxycycline for the osteomyelitis in the left great toe for now. Infectious disease may be necessary 1/17; 2-week hiatus. Patient was admitted to hospital at [place]. My understanding is he underwent an angioplasty of the right anterior tibial artery and had stents placed in the left anterior artery and the left tibial peroneal trunk. This was done by [name] of interventional radiology. There is no major change in either 1 of the wounds. They have been using Aquacel Ag. As far as they are aware no imaging studies were done of the foot which is indeed unfortunate. I had him on doxycycline for 2 weeks since we identified the osteomyelitis in the left great toe by plain x-ray. I have renewed that again today. I am still suspicious about osteomyelitis in the amputation site and would consider doing another MRI to compare with the one done in September. The idea of hyperbaric oxygen certainly comes up for discussion 1/24; no major change in either wound area. I have him on doxycycline for osteomyelitis at the tip of the left great toe. As noted he has been previously and recently revascularized by [name] at [place]. He is  tolerating the doxycycline well. For some reason we do not have an infectious disease consult yet. Culture of drainage from the right foot site last week was negative 1/31; MRI of the right foot did not show osteomyelitis of the right ankle and foot. Notable for a skin ulceration overlying the second metatarsal stump with generalizing soft tissue edema of the ankle and foot consistent with cellulitis. Noted to have a partial-thickness tear of the Achilles tendon 7.5 cm proximal to the insertion Nothing really new in terms of symptoms. Patient's area on the tip of the left great toe is just about closed although he still has the probing area on the metatarsal amputation site. Appointment with Dr. Linus Salmons of infectious disease next week 2/7; Dr. Novella Olive did not feel that any further antibiotics were necessary he would follow-up in 2 months. The left great toe appears to be closed still some surface callus that I gently looked under high did not see anything open or anything that was threatening to be open. He still has the open area on the mid part of his TMA site using endoform 2/14; left great toe is closed and he is completing his doxycycline as of last Sunday. He is not on any antibiotics. Unfortunately out of the right foot his wife noticed some subdermal hemorrhage this week. He had been walking 2 miles I had given him permission to do so this is not really a plantar wound. Using endoform to this wound but I changed to silver alginate this week 2/21; left great toe remains closed. Culture last week grew Streptococcus angiosis which I am not really familiar with however it is penicillin sensitive and I am going to put him on Augmentin. Not much change in the wound on the right foot the deep area is still probing precariously close to bone and the wound on the margin of the TMA is larger. 2/28; left great toe remains closed. He is completing the Augmentin I gave him last week. 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 This is being shown to [name] at [place] to see if there is anything else that can be done here. He has been  using silver alginate strips on the right 3/6; left great toe remains closed. He sees [name] on Monday. The area on the plantar aspect of the right foot has the same small orifice with thick callused tissue around this. However this time with removal of the callus tissue the wound is open all the way along the incision line to the end medially. We have been using silver alginate. 3/13; left toe remains closed although the area is callused. He sees Dr. Jacqualyn Posey of podiatry next week. The area on the plantar right foot looked a lot better this week. Culture I did of this was negative we use silver alginate. He is going next week for an attempt at revascularization by [name] 3/23; left toe remains closed although the area is callused. He will see Dr. Jacqualyn Posey in follow-up. The area on the right plantar foot continues to look surprisingly better over the last 3 visits. We have been using silver alginate. The revascularization he was supposed to have by [name] at [facility] in [place] has been canceled [facility] procedure] 4/6; the right foot remains closed albeit callused. Podiatry canceled the appointment with regards to the left great toe. He has not seen [name] at [facility] but thinks that [name] has "done all he can do". He would be a candidate for the hemostaemix trial Readmission 07/29/2019 Mr. Stann Mainland is a man we know well from at least 3 previous stays in this clinic. He is a type II diabetic with severe PAD followed by [name] at [facility] in [place]. 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 So far everything in both of these areas has remained closed. About 2 weeks ago he went to  see his podiatrist at [facility] Dr. Babs Bertin. He had a thick callus on the left fifth metatarsal head that was shaved. He has developed an open wound in this area. They have been offloading this in his diabetic shoes. The patient has not had any more revascularizations by [name] since the last time he was here. ABI in our clinic at the posterior tibial on the left was 1.06 10/6; no real change in the area on the plantar met head. Small wound with 2 mm of depth. He has thick skin probably from pressure around the wound. We have been using silver alginate 10/13; small wound in the left fifth plantar met head. Arrives today with undermining laterally and purulent drainage. Our intake nurse cultured this. His wife stated they noticed a change in color over the last day or 2. He is not systemically unwell 10/19; small wound on the fifth plantar metatarsal head. Culture I did last week showed Staphylococcus lugdunensis. Although this could be a skin contaminant the possibility of a skin and soft tissue infection was there. I did give him empiric doxycycline which should have covered this. We are using silver alginate to the wound. We put him in a total contact cast today. 10/22; small wound on the fifth plantar metatarsal head. He has completed antibiotics. He also has severe PAD which worries me about just about any wound on this man's foot 10/29; small superficial area on the fifth plantar metatarsal head. Measuring slightly smaller. He is not currently on any antibiotics. I been using a total contact cast in this man with very severe PAD but  he seems to be tolerating this well 11/5; left plantar fifth metatarsal head. Silver alginate being used under a total contact cast 11/12; wound not much different than last week. Using a #15 scalpel debridement around the wound. Still silver collagen under a total contact cast. He has severe PAD and that may be playing a role in this 11/19;  disappointing that the wound is not really changed that much. I think it is come down in overall surface area because originally this was on the lateral part of the fifth metatarsal head however recently it is not really changed. Most of this is filled in but it will not epithelialized there is surface debris on this which may be mostly related to ischemia. They have an appointment with [name] on 12/9 09/24/2019 on evaluation today patient appears to be doing somewhat better with regard to the wound on the left fifth metatarsal head. Fortunately there does not appear to be any signs of active infection at this time. No fevers, chills, nausea, vomiting, or diarrhea. The wound does not appear to be completely closed and I do feel like the Hydrofera Blue is helping to some degree although I feel like the cast as well as keeping things from breaking down or getting any larger at least. As far as his wound overview it shows that the overall size of the wound is slightly smaller but really maintaining within the realm of about the same. He had no troubles with the cast which is good news. 12/1; left fifth metatarsal head. Perhaps somewhat more vibrant. We have been using Hydrofera Blue. He sees [name] at [place] 1 week tomorrow. He be back next week and I hope to have a better idea which way the wound is going however I will not cast him next week in case [name] wants to reevaluate things in his foot. The left leg is the leg with the stent in place and I wonder whether these are going to have to be reevaluated. 12/8; left fifth metatarsal head. Quite a bit better this week. Only a small open area remains. He sees [name] at [place] tomorrow. [name] is previously done his vascular interventions. He has 2 stents in the left leg as I remember things. I therefore will not put him in a total contact cast today. He will be using a forefoot off loader. We will use silver collagen on the wound. We will see  him again next week 12/15; left fifth metatarsal head. He saw [name] and is scheduled for an angiogram on Thursday. Unfortunately although his wound was a lot better last week it is really deteriorated this week. Small punched-out hole with depth and overhanging tissue. We have been using Hydrofera Blue 12/22; patient had an 80% stenosis proximal to the stent I believe in the below-knee popliteal artery. This was opened by [name]. According the patient the stent itself was satisfactory. I have not been able to review this note. 12/29; patient arrives today with the wound about the same size some undermining medially. We have been using Hydrofera Blue under a total contact cast and that is what we will do again today 11/04/2019. Slightly smaller in orifice but about 4 mm undermining almost circumferentially. We have been using Hydrofera Blue. Apparently the Hydrofera Blue did not stay on the wound after we removed the cast. Some minor looking cast irritation on the right lateral 1/12; unfortunately things did not go well this week. Arrives in clinic today  with a deeper wound with undermining more concerning a area of pus. Specimen was obtained for culture. We are not going to be able to put him in a cast today. 1/19; purulent material last week which I cultured showed Enterobacter cloacae. He was on ampicillin previously prescribed by his primary doctor which should not have covered this however mysteriously the wound looks somewhat better. There is less depth less erythema certainly no drainage. He will need a course of ciprofloxacin which I will provide today. 1/26; he has completed the ciprofloxacin. I don't think he needs any additional antibiotics. The areas on the left fifth plantar met head. We've been using silver alginate 2/2; comes in today with 0.4 cm of circumferential undermining around a small wound in this area. We have been using silver alginate with a forefoot offloading  boot 2/9; again the wound appears larger nonviable surface and with 0.4 cm roughly of circumferential undermining. We have been using silver alginate with a forefoot offloading boot. The wound does not look infected however his wife is quick to point out about swelling on the dorsal foot. 2/16; not much change. Comes in with a small wound with a nonviable necrotic surface. Again marked undermining that I had totally removed last week. He had a arterial Doppler last week in [name] office at [facility]. They have not heard these results. They are somewhat frustrated. 2/23; if anything this is worse. Undermining increased depth. Nonviable surface that looks pale. According his wife is TBI on the right was 0.22 although I have not verified this. He is going for an angiogram with [name] next Monday. We are using polymen and offloading in a forefoot offloading boot. 3/2; the patient was revascularized yesterday by [facility]. He had successful PTA of the left peroneal 60% to less than 20% and successful PTA of the left ATA 100% to less than 20%. He now has a pulse in the left foot Now that he has some additional blood flow there is not a lot of options here. On one hand we want activity to help keep blood flow augmented on the other hand pressure relief is going to be paramount if we have any chance to get this to close. After some discussion with his wife and patient I went ahead and put him in a total contact cast. 3/9; he arrives in clinic today with some debris over the wound surface. We use silver collagen under a total contact cast after his revascularization by [name] 3/16; wound about the same depth at 0.5 cm. Surface area perhaps slightly less but it is the depth the really is important. We have been using silver collagen 3/23; not much change here. There is still undermining medially the depth is about the same tissue looks somewhat better. We switched to Iodoflex  last week to try to get a better looking wound surface. He does have a small abrasion over the proximal interphalangeal joint dorsally. I this is so small it is difficult even to tell whether it is open. Some abrasion between the fifth toe and the webspace as well 3/30; if anything the wound is deeper this week and larger. Very disappointing. Under illumination debris on the surface which I debrided gently with a #3 curette there is minimal bleeding. We had been using iodoflex under a total contact cast. I am going to take him out of the cast and change him to [place] today which his wife will change daily. 4/6; wound  bed looks better. Still undermining medially. Some maceration and erythema around the wound. We have been using Santyl change daily. I took him out of a total contact cast last week Objective Constitutional Sitting or standing Blood Pressure is within target range for patient.. Pulse regular and within target range for patient.Marland Kitchen Respirations regular, non-labored and within target range.. Temperature is normal and within the target range for the patient.Marland Kitchen Appears in no distress. Vitals Time Taken: 1:00 PM, Height: 69 in, Weight: 210 lbs, BMI: 31, Temperature: 98.1 F, Pulse: 74 bpm, Respiratory Rate: 18 breaths/min, Blood Pressure: 117/67 mmHg, Capillary Blood Glucose: 133 mg/dl. General Notes: patient stated CBG was 133 this morning General Notes: Wound exam; no improvement in surface area perhaps slightly less deep. Light surface debridement with a #3 curette. There is no palpable bone. Post debridement culture done Integumentary (Hair, Skin) Wound #5 status is Open. Original cause of wound was Gradually Appeared. The wound is located on the Left Metatarsal head fifth. The wound measures 0.7cm length x 1cm width x 0.5cm depth; 0.55cm^2 area and 0.275cm^3 volume. There is Fat Layer (Subcutaneous Tissue) Exposed exposed. There is no tunneling or undermining noted. There is a  medium amount of serosanguineous drainage noted. The wound margin is well defined and not attached to the wound base. There is medium (34-66%) pink, pale granulation within the wound bed. There is a medium (34-66%) amount of necrotic tissue within the wound bed including Adherent Slough. 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 Dressing Change Frequency: Wound #5 Left Metatarsal head fifth: Change dressing every day. Wound Cleansing: Wound #5 Left Metatarsal head fifth: May shower and wash wound with soap and water. Primary Wound Dressing: Wound #5 Left Metatarsal head fifth: Santyl Ointment Other: - pad right great toe for protection Secondary Dressing: Dry Gauze Foam Border Laboratory ordered were: Anerobic culture - non healing wound Services and Therapies ordered were: Venous Duplex Doppler, left lower leg - left lower leg swelling - rule out DVT - STAT Patient request it to be done at [facility] #1 Santyl ointment and border foam #2 he has significant edema of his dorsal foot extending into the calf with tenderness and warmth. DVT rule out Electronic Signature(s) Signed: 02/03/2020 6:13:52 PM By: [name] MD Entered By: [name] on 02/03/2020 13:53:24 -------------------------------------------------------------------------------- SuperBill Details Patient Name: Date of Service: EMARI, DEMMER 02/03/2020 Medical Record [id number] Patient Account Number: [id number] Date of Birth/Sex: Treating RN: 09/14/1946 (73 y.o. [name]) [name] Primary Care Provider: Rory Guzman Other Clinician: Referring Provider: Treating Provider/Extender:Levonne Carreras, [name], [name] in Treatment: 27 Diagnosis  Coding ICD-10 Codes Code Description E11.621 Type 2 diabetes mellitus with foot ulcer E11.51 Type 2 diabetes mellitus with diabetic peripheral angiopathy without gangrene L97.521 Non-pressure chronic ulcer of other part of left foot limited to breakdown of skin Facility Procedures CPT4 Code: [id number] Description: 99213 - WOUND CARE VISIT-LEV 3 EST PT Modifier: Quantity: 1 Physician Procedures CPT4 Code Description: [id number] - WC PHYS LEVEL 3 - EST PT ICD-10 Diagnosis Description E11.621 Type 2 diabetes mellitus with foot ulcer E11.51 Type 2 diabetes mellitus with diabetic peripheral angiop L97.521 Non-pressure chronic ulcer of other part  of left foot li Modifier: athy without gan mited to breakdo Quantity: 1 grene wn of skin Electronic Signature(s) Signed: 02/03/2020  6:43:44 PM By: Todd Coria RN Signed: 02/05/2020 5:58:56 PM By: [name] MD Previous Signature: 02/03/2020 6:13:52 PM Version By: [name] MD Entered By: [name] on 02/03/2020 18:32:48

## 2020-02-06 LAB — AEROBIC CULTURE W GRAM STAIN (SUPERFICIAL SPECIMEN)

## 2020-02-06 LAB — AEROBIC CULTURE? (SUPERFICIAL SPECIMEN)

## 2020-02-10 ENCOUNTER — Encounter (HOSPITAL_BASED_OUTPATIENT_CLINIC_OR_DEPARTMENT_OTHER): Payer: Medicare Other | Admitting: Internal Medicine

## 2020-02-10 ENCOUNTER — Other Ambulatory Visit: Payer: Self-pay

## 2020-02-10 DIAGNOSIS — E1151 Type 2 diabetes mellitus with diabetic peripheral angiopathy without gangrene: Secondary | ICD-10-CM | POA: Diagnosis not present

## 2020-02-10 NOTE — Progress Notes (Signed)
[name] ([id number]) Visit Report for 02/10/2020 HPI Details Patient Name: Date of Service: [name], [name] 02/10/2020 12:30 PM Medical Record [id number] Patient Account Number: [id number] Date of Birth/Sex: Treating RN: Mar 11, 1946 (74 y.o. [name] Primary Care Provider: Rory Guzman Other Clinician: Referring Provider: Treating Provider/Extender:[name], [name]: 28 History of Present Illness HPI Description: 05/18/16; this is a 74year-old diabetic who is a type II diabetic on insulin. The history is that he traumatized his right foot developed a sore sometime in late March. Shortly thereafter he went on a cruise but he had to get off the cruise ship in [place] and fly urgently back to [place] where he was admitted to [facility] and ultimately underwent a transmetatarsal amputation by Dr. 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 According to the patient and his wife this wound never really healed. He was seen on 2 occasions in the wound care center in [place] and had vascular studies and then was referred urgently to Dr. Bridgett Guzman of vascular surgery. He underwent an angiogram on 05/10/16. Unfortunately nothing really could be done to improve his vascular status. He had a 75-90% stenosis in the midsegment of 1 segment of the posterior femoral artery. He had a patent popliteal, his anterior tibial occluded shortly after takeoff. Perineal had a greater than 90% stenosis posterior tibial is occluded feet had no distal collaterals feed distal aspect of the transmetatarsal amputation site. The patient tells me that he had a prolonged period of Santyl by Dr. Doran Guzman was some initial improvement but then this was stopped. I think they're only applying daily dressings/dry dressings. He has not had a recent x-ray of the right foot he did have one before his surgery in April. His wife by the dimensions of the wound/surgical site being followed at  home since 4/20. At that point the dimensions were 0.5 x 12 x 0.2 on 7/19 this was 1.8 x 6 x 0.4. He is not currently on any antibiotics. His hemoglobin A1c in early April was 12.9 at that point he was started on insulin. Apparently his blood sugars are much lower he has an appointment with [name] of endocrine next week. 05/29/16 x-ray of the area did not show osteomyelitis. I think he probably needs an MRI at this point. His wife is asking about something called"Yireh" cream which is not FDA approved. I have not heard of this. 06/22/16; MRI did not really suggest osteomyelitis. There was minimal marrow edema and enhancement in the stump of the second metatarsal felt to be secondary likely to postoperative change rather than osteomyelitis. The patient has arranged his own consultation with [name] at [facility], apparently their daughter lives in [place] and has some connection here. Any improvement in vascular supply by [name] would of course be helpful. Dr. Bridgett Guzman did not feel that anything further could be done other than amputation if wound care did not result in healing or if the area deteriorates. 06/26/16; the patient has been to see [name] at [facility] and had an angiogram. He is going for a procedure on Thursday which will involve catheterization. I'm not sure if this is an anterograde or retrograde approach. He has been using Santyl to the wound 07/10/16; the patient had a repeat angiogram and angioplasty at [place] by Dr. Brunetta Guzman. His angiogram showed right CFA and profundal widely patent. The right as of a.m. popliteal artery were widely patent the right anterior tibial was occluded proximally and reconstitutes at  the ankle. Peroneal artery was patent to the foot. Posterior tibial artery was occluded. The patient had angioplasty of the anterior tibial artery.. This was quite successful. He was recommended for Plavix as well as aspirin. 07/17/16; the patient was close to be a nurse  visit today however the outer dressing of the Apligraf fell off. Noted drainage. I was asked to see the wound. The patient is noted an odor however his wife had noted that. Drainage with Apligraf not necessarily a bad thing. He has not been systemically unwell 07/24/16; we are still have an issue with drainage of this wound. In spite of this I applied his second Apligraf. Medially the area still is probing to bone. 08/07/16; Apligraf reapplied in general wound looks improved. 08/21/16 Apligraf #4. Wound looks much better 09/04/16 patientt's wound again today continues to appear to improve with the application of the Apligraf's. He notes no increased discomfort or concerns at this point in time. 09/18/16; the patient returns today 2 weeks after his fifth application of Apligraf. Predictably three quarters of the width of this wound has healed. The deep area that probe to bone medially is still open. The patient asked how much out-of-pocket dollars would be for additional Apligraf's. 09/25/16; now using Hydrofera Blue. He has completed 5 Apligraf applications with considerable improvement in this deep open transmetatarsal amputation site. His wound is now a triangular-shaped wound on the medial aspect. At roughly 12 to 2:00 this probes another centimeter but as opposed to in the past this does not probe to bone. The patient has been seen at [place] by Dr. Zenia Guzman. He is not planning to do any more revascularization unless the wound stalls or worsens per the patient 10/02/16; 0.7 x 0.8 x 0.8. Unfortunately although the wound looks stable to improved. There is now easily probable bone. This hasn't been present for several weeks. Patient is not otherwise symptomatic he is not experiencing any pain. I did a culture of the wound bed 10/09/16. Deterioration last week. Culture grew MRSA and although there is improvement here with doxycycline prescribed over the phone I'm going to try to get him linezolid 600 twice  a day for 10 days today. 10/16/16; he is completing a weeks worth of linezolid and still has 3 more days to go. Small triangular-shaped open area with some degree of undermining. There is still palpable bone with a curet. Overall the area appears better than last week 10/20/16 he has completed the linezolid still having some nausea and vomiting but no diarrhea. He has exposed bone this week which is a deterioration. 10/27/16 patient now has a small but probing wound down to bone. Culture of this bone that I did last week showed a few methicillin-resistant staph aureus. I have little doubt that this represents acute/subacute osteomyelitis. The patient is currently on Doxy which I will continue he also completed 10 days of linezolid. We are now in a difficult situation with this patient's foot after considerable discussion we will send him back to see Dr. Doran Guzman for a surgical opinion of this I'm also going to try to arrange a infectious disease consult at [facility] hopefully week and get this prior to her usual 4-6 weeks we having [place]. The patient clearly is going to need 6 weeks of IV vancomycin. If we cannot arrange this expediently I'll have to consider ordering this myself through a home infusion company 11/03/16; the patient now has a small in terms of circumference but probing wound. No bone palpable today. The  patient remains on doxycycline 100 twice a day which should support him until he sees infectious disease at [facility] next week the following week on Wednesday I believe he has an appointment with [name] at [facility] who is his vascular cardiologist. Finally he has an appointment with Dr. Doran Guzman on 11/22/16 we have been using silver alginate. The patient's wife states they are having trouble getting this through [facility] 11/13/16; the patient was seen by infectious disease at [facility] in the 11th PICC line placed in preparation for IV antibiotics. A tummy he has not going to get IV  vancomycin o Ceftaroline. They've also ordered an MRI. Patient has a follow-up with Dr. Doran Guzman on 11/22/16 and [name] at [facility] tomorrow 11/23/16 the patient is on daptomycin as directed by infectious disease at [facility]. He is also been back to see [name] at [facility]. He underwent a repeat arteriogram. He had a successful PTA of the right anterior tibial artery. He is on dual antiplatelete Guzman with Plavix and aspirin. Finally he had the MRI of his foot in [place]. This showed cellulitis about the foot worse distally edema and enhancement in the reminiscent of the second metatarsal was consistent with osteomyelitis therefore what I was assuming to be the first metatarsal may be actually the second. He also has a fluid collection deep to the calcaneus at the level of the calcaneal spur which could be an abscess or due to adventitial bursitis. He had a small tear in his Achilles 11/30/16; the patient continues on daptomycin as directed by infectious disease at [facility]. He is been revascularized by [name] at [facility] in [place]. He has been to see Gretta Arab who was the orthopedic surgeon who did his original amputation. I have not seen his not however per the patient's wife he did not offer another surgical local surgical prodecure to remove involved bone. He verbalized his usual disbelief in not just hyperbarics but any medical therapy for this condition(osteomyelitis). I discussed this in detail with the patient today including answering the question about a BKA definitively "curing" the current condition. 12/07/16; the patient continues on daptomycin as directed by infectious disease at [facility]. This is directed at the MRSA that we cultured from his bone debridement from 12/22. Lab work today shows a white count of 8.7 hemoglobin of 10.5 which is microcytic and hypochromic differential count shows a slightly elevated monocyte count at 1.2 eosinophilic count of 0.6. His  creatinine is 1.11 sedimentation rate apparently is gone from 35-34 now 40. I explained was wife I don't think this represents a trend. His total CK is 48 12/14/16- patient is here for follow-up evaluation of his right TMA site. He continues to receive IV daptomycin per infectious disease. His serum inflammatory markers remain elevated. He complains of intermittent pain to the medial aspect of the TMA site with intermittent erythema. He voices no complaints or concerns regarding hyperbaric therapy. Overall he and his wife are expressing a frustration and discouragement regarrding the length of time of Guzman. 12/21/16; small open wound at roughly the first or second metatarsal metatarsalphalyngeal joint reminiscence of his transmetatarsal amputation site he continues to receive IV daptomycin per infectious disease at [facility]. He will finish these a week tomorrow. He has lab work which I been copied on. His white count is 10.8 hemoglobin 8.9 MCV is low at 75., MCH low at 24.5 platelet count slightly elevated at 626. Differential count shows 70% neutrophils 10% monocytes and 10% eosinophils. His  comprehensive metabolic panel shows a slightly low sodium at 133 albumin low at 3.1 total CK is normal at 42 sedimentation rate is much higher at 82. He has had iron studies that show a serum iron of 15 and iron binding capacity of 238 and iron saturation of 6. This is suggestive of iron deficiency. B12 and folate were normal ferritin at 113 The patient tells me that he is not eating well and he has lost weight. He feels episodically nauseated. He is coughing and gagging on mucus which she thinks is sinusitis. He has an appointment with his primary doctor at 5:00 this afternoon in [facility] 01/02/17; the patient developed a subacute pneumonitis. He was admitted to [facility] after a CT scan showed an extensive interstitial pneumonitis [I have not yet seen this]. 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 He has since been discharged. He is not on oxygen. He feels fatigued and very short of breath with exertion. At [facility] the wound care nurse there felt that his wound was healed. He did complete his daptomycin and has follow-up with infectious disease on Friday. X-rays I did before he went to [facility] still suggested residual osteomyelitis in the anterior aspect of the second must metatarsal head. I'm not sure I would've expected any different. There was no other findings. I actually think I did this because of erythema over the first metatarsal head reminiscent 01/11/17; the patient has eosinophilic pneumonitis. He has been reviewed by pulmonology and given clearance for hyperbarics at least that's what his wife says. I'll need to see if there is note in care everywhere. Apparently the prognosis for improvement of daptomycin induced eosinophilic granulocyte is is 3 months without steroids. In the meantime infectious disease has placed him on doxycycline until the wound is closed. He is still is lost a lot of weight and his blood sugars are running in the mid 60s to low 80s fasting and at all times during the day 01/18/17; he has had adjustments in his insulin apparently his blood sugars in the morning or over 100. He wants to restart his hyperbaric Guzman we'll do this at 1:00. 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 I think there is good reason to complete his treatments in order to give him the best chance of maintaining a healed status and these DFU 3 wounds with MRSA infection in the bone 02/15/17; the patient was seen today in conjunction with HBO. He completed hyperbaric oxygen today. The open area on his transmetatarsal site has remained closed. There was an area of erythema when I saw him  earlier in the week on the posterior heel although that is resolved as of today as well. He has been using a cam walker. This is a patient who came to [place] after a transmetatarsal amputation that was necrotic and dehisced. He required revascularization percutaneously on 2 different occasions by [name] of invasive cardiology at [facility]. 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 He went to [facility] infectious disease and had IV daptomycin for 5 weeks before developing eosinophilic pneumonitis and requiring an admission to hospital/ICU. He made a good recovery and is continued on doxycycline since. As mentioned his foot is closed now. He has a follow-up with [name] tomorrow. He is going  to [employer] on Monday for a custom-made shoe READMISSION Last visit [name] [facility] 02/16/17 1. Critical limb ischemia of the RLE: s/p transmetatarsal amputation of the RLE, now completely healed. s/p right ATA percutaneous revascularization procedure x2, most recently 11/20/2016 s/p PTA of right AT 100% to less than 20% with a 2.5 x 200 balloon. He does have some residual osteomyelitis, but given the wound is closed, the orthopedist has recommended to follow. He has a fitting for a special shoe coming up next week. He has completed a course of daptomycin and doxycycline and he has been discharged by ID. He has completed a course of hyperbaric therapy. Continue Continue medical management with aspirin, Plavix and statin therapy. We will discuss ongoing Plavix therapy at follow-up in 6 months. 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 Given no symptoms, we will conservatively manage. He doesn't want to do any more invasive studies at this time, which is reasonable. The patient  arrives today out of 2 concerns both on the right transmetatarsal site. 1 at the level of the reminiscent fifth metatarsal head and the other at roughly the first or second. Both of these look like dark subcutaneous discoloration probably subdermal bleeding. He is recently obtained new adaptive footwear for the right foot. He also has a callus on the left fifth dorsal toe however he follows with podiatry for this and I don't think this is any issue. ABIs in this clinic today were 0.66 on the right and 1.06 on the left. On entrance into our clinic initially this was 0.95 and 0.92. He does not describe current claudication. They're going away on a cruise in 8 weeks and I think are trying to do the month is much as they can proactively. They follow with podiatry and have an appointment with [name] in October READMISSION 06/25/18 This is a patient that we have not seen in almost a year. He is a type II diabetic with known PAD. He is followed by [name] of interventional cardiology at [facility] in [place]. He is required revascularization for significant PAD. When we first saw him he required a transmetatarsal amputation. He had underlying osteomyelitis with a nonhealing surgical wound. This eventually closed with wound care, IV antibiotics and hyperbaric oxygen. They tell me that he has a modified shoe and he is very active walking up to 4 miles a day. He is followed by Dr. Geroge Baseman of podiatry. His wife states that he underwent a removal of callus over this site on July 9. She felt there may be drainage from this site after that although she could never really determined and there was callus buildup again. On 06/01/18 there was pressure bleeding through the overlying callus. he was given a prescription for 7 days of Bactrim. Fortuitously he has an appointment with [name] on 06/04/18 and he immediately underwent revascularization of the right leg although I have not had a chance to review these  records in care everywhere. This was apparently done through anterior and retrograde access. On 06/06/18 he had another debridement by Dr. Bernette Mayers. Vitamin soaking with Epsom salts for 20 minutes and applying calcium alginate. The original trans-met was on April 2017 I believe by Dr. Doran Guzman. His ABI in our clinic was noncompressible today. 07/02/18; x-ray I ordered last week was negative for osteomyelitis. Swab culture was also negative. He is going to  require an MRI which I have ordered today. 07/09/18; surprisingly the MRI of the foot that I ordered did not show osteomyelitis. He did suggest the possibility of cellulitis. For this reason I'll go ahead and give him a 10 day course of doxycycline. Although the previous culture of this area was negative 07/16/18; it arrives with the wound looking much the same. Roughly the same depth. He has thick subcutaneous tissue around the wound orifice but this still has roughly the same depth. Been using silver alginate. We applied Oasis #1 today 07/23/2018; still having to remove a lot of callus and thick subcutaneous tissue to actually define the wound here. Most of this seems to have closed down yet he has a comma shaped divot over the top of the area that still I think is open. We applied Oasis #2 His wife expressed concern about the tip of his left great toe. This almost looks like a small blister. She also showed it today to her podiatrist Dr. Geroge Baseman who did not think this was anything serious. I am not sure is anything serious either however I think it bears some watching. He is not in any pain however he is insensate 07/30/2018;; still on a lot of nonviable tissue over the surface of the wound however cleaning this up reveals a more substantial wound orifice but less of a probing wound depth. This is not probed to bone. There is no evidence of infection The wife is still concerned about a non-open area on the tip of his left great toe. Almost feels like a  bony outgrowth. She had previously showed this to podiatry. I do not think this is a blister. A friction area would be possible although he is really not walking according to his wife. He had a small skin tag on his right buttock but no open wound here either 08/06/2018; we applied a third Oasis last week. Unfortunately there is really no improvement. Still requiring extensive debridement to expose the wound bed from a horizontal slitlike depression. I still have not been able to get this to fill in properly. On the positive side there is now no probable bone from when he first came into the facility. I changed him to silver alginate today after a reasonably aggressive debridement 08/13/2018; once again the patient comes in with skin and subcutaneous tissue closing over the small probing area with the underlying cavity of the wound on the right TMA site. I applied silver alginate to this last week. Prior to that we used Oasis x3 still not able to get this area granulating. He does not have a probing area of the bone which is an improvement from when I spur started working on this and an MRI did not suggest osteomyelitis. I do not see evidence of infection here but I am increasingly concerned about why I cannot get this area to granulate. Each time I debrided this is looks like this is simply a matter of getting granulation to fill in the hole and then getting epithelialization. This does not seem to happen Also I sent him back to see podiatry Dr. Earleen Newport about the what felt to be bony outgrowth on the tip of his left great toe. Apparently after heel he left the clinic last week or the next day he developed a blood blister. He did see Dr. Earleen Newport. He went on to have a debridement of the medial nail cuticle he now has an open area here as well as some denuded skin. They  did an x-ray apparently does have a bony outgrowth or spur but I am not able to look at this. They are using topical antibiotics  apparently there was some suggested he use Santyl. Patient's wife was anxious for my opinion of this 08/20/2018; we are able to keep the wound open this time instead of the thick subcutaneous tissue closing over the top of it however unfortunately once again this probes to bone. I do not see any evidence of infection and previous MRI did not show osteomyelitis. I elected to go back to the Oasis to see if we can stimulate some granulation. Last saw his vascular interventional cardiologist [name] at the beginning of August and he had a repeat procedure. Nevertheless I wonder how much blood flow he has down to this area With regards to the left first toe he is seeing Dr. Earleen Newport next week. They are applying Bactroban to this area. 08/27/2018 Once again he comes in with thick eschar and subcutaneous tissue over the top of the small probing hole. This does not appear to go down to bone but it still has roughly the same depth. I reapplied Oasis today Over the left great toe there appears to be more of the wound at the tip of his toe than there was last week. This has an eschar on the surface of it. Will change to Santyl. There is seeing podiatry this afternoon 09/03/2018 He comes in today with the area on the transmetatarsal's site with a fair amount of callus, nonviable tissue over the circumference but it was not closed. I removed all of this as well as some subcutaneous debris and reapplied Oasis. There is no exposed bone The area over the tip of the left great toe started off as a nodule of uncertain etiology. He has been followed with podiatry. They have been removing part of the medial nail bed. He has nonviable tissue over the wound he been using Santyl in this area 09/10/18 Unfortunately comes in with neither wound area looking improved. The transmetatarsal amputation site once Again has nonviable debris over the surface requiring debridement. Unfortunately underneath this there is  nothing that looks viable and this once again goes right down to bone. There is no purulent drainage and no erythema. Also the surgical wound from podiatry on the left first toe has an ischemic-looking eschar over the surface of the tip of the toe I have elected not to attempt candidly debride this His wife as arranged for him to have follow-up noninvasive studies in [place] under the care of [name]. The question is he has known severe PAD. They had recently seen him in August. He has had revascularizations in both legs within the last 4 or 5 months. He is not complaining of pain and I cannot really get a history of claudication. He has not systemically unwell 09/20/2018 the patient has been to [facility] and been revascularized by [name] earlier this week. Apparently he was able to open up the anterior tibial artery although I have not actually seen his formal report. He is going for an attempt to revascularize on the left on Monday. He is apparently working with an investigational stent for lower extremity arteries below the knee and he has talked to the patient about placing that on Monday if possible. The patient has been using silver alginate on the transmetatarsal amputation site and Santyl on the left 09/30/2018; patient had his revascularization on the left this apparently included a standard approach  as well as a more distal arterial catheterization although I do not have any information on this from [name]. In fact I do not even see the initial revascularization that he had on the right. I have included the arterial history from [name] last note however below; ASSESSMENT/PLAN: 1. Hx of Critical limb ischemia bilateral lower extremities, PAD: -s/p transmetatarsal amputation of the RLE. -s/p right ATA percutaneous revascularization procedure x2, most recently 11/20/2016 s/p PTA of right AT 100% to less than 20% with a 2.5 x 200 balloon. -On original angiogram  05/15/2016, he had a significant 70-95% left popliteal artery stenosis with AT and PT artery occlusions and one vessel runoff via the peroneal artery. -03/21/2018 s/p PTA of 90% left popliteal artery to <10% with a 5x20 cutting balloon, PTA of 90% left peroneal to <20% with a 3x20 balloon, PTA of 100% left AT to <20% with a 2.5x220 balloon. 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 Will plan on the LLE soon after. Risks/benefits of procedure have been discussed and patient has elected to proceed. We have been using endoform to the right TMA amputation site wound and Santyl to the left great toe 10/07/2018; the area on the tip of his left great toe looked better we have been using Santyl here. We continue to have a very difficult probing hole on the right TMA amputation site we have been using endoform. I went on to use his fifth Oasis today 10/14/2018; the tip of the left great toe continues to look better. We have been using Santyl here the surface however is healthy and I think we can change to an alginate. The right TMA has not changed. Once again he has no superficial opening there is callus and thick subcutaneous tissue over the orifice once you remove this there is the probing area that we have been dealing with without too much change. There is no palpable bone I have been placing Oasis here and put Oasis #6 in this today after a more vigorous debridement 10/21/18; the left great toe still has necrotic surface requiring debridement. The right TMA site hasn't changed in view of the thick callus over the wound bed. With removal of this there is still the opening however this does not appear to have the same depth. Again there is no palpable bone. Oasis was replaced 10/28/2018; patient comes in with both wounds looking worse. The area over the  first toe tip is now down to bone. The area over the TMA site is deeper and down to bone clearly with a increase in overall wound area. Equally concerning on the right TMA is the complete absence of a pulse this week which is a change. We are not even able to Doppler this. On the right he has a noncompressible ABI greater than 1.4 11/04/2018. Both wounds look somewhat worse. X-rays showed no osteomyelitis of the right foot but on the left there was underlying osteomyelitis in the left great toe distal phalanx. I been on the phone to [name] surface at [facility] in [place] and they are arranging for another angiogram on the right on Monday. I have him on doxycycline for the osteomyelitis in the left great toe for now. Infectious disease may be necessary 1/17; 2-week hiatus. Patient was admitted to hospital at [place]. My understanding is he underwent an angioplasty of  the right anterior tibial artery and had stents placed in the left anterior artery and the left tibial peroneal trunk. This was done by [name] of interventional radiology. There is no major change in either 1 of the wounds. They have been using Aquacel Ag. As far as they are aware no imaging studies were done of the foot which is indeed unfortunate. I had him on doxycycline for 2 weeks since we identified the osteomyelitis in the left great toe by plain x-ray. I have renewed that again today. I am still suspicious about osteomyelitis in the amputation site and would consider doing another MRI to compare with the one done in September. The idea of hyperbaric oxygen certainly comes up for discussion 1/24; no major change in either wound area. I have him on doxycycline for osteomyelitis at the tip of the left great toe. As noted he has been previously and recently revascularized by [name] at [place]. He is tolerating the doxycycline well. For some reason we do not have an infectious disease consult yet. Culture of drainage from  the right foot site last week was negative 1/31; MRI of the right foot did not show osteomyelitis of the right ankle and foot. Notable for a skin ulceration overlying the second metatarsal stump with generalizing soft tissue edema of the ankle and foot consistent with cellulitis. Noted to have a partial-thickness tear of the Achilles tendon 7.5 cm proximal to the insertion Nothing really new in terms of symptoms. Patient's area on the tip of the left great toe is just about closed although he still has the probing area on the metatarsal amputation site. Appointment with Dr. Linus Salmons of infectious disease next week 2/7; Dr. Novella Olive did not feel that any further antibiotics were necessary he would follow-up in 2 months. The left great toe appears to be closed still some surface callus that I gently looked under high did not see anything open or anything that was threatening to be open. He still has the open area on the mid part of his TMA site using endoform 2/14; left great toe is closed and he is completing his doxycycline as of last Sunday. He is not on any antibiotics. Unfortunately out of the right foot his wife noticed some subdermal hemorrhage this week. He had been walking 2 miles I had given him permission to do so this is not really a plantar wound. Using endoform to this wound but I changed to silver alginate this week 2/21; left great toe remains closed. Culture last week grew Streptococcus angiosis which I am not really familiar with however it is penicillin sensitive and I am going to put him on Augmentin. Not much change in the wound on the right foot the deep area is still probing precariously close to bone and the wound on the margin of the TMA is larger. 2/28; left great toe remains closed. He is completing the Augmentin I gave him last week. 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 This is being shown to [name] at [place] to see if there  is anything else that can be done here. He has been using silver alginate strips on the right 3/6; left great toe remains closed. He sees [name] on Monday. The area on the plantar aspect of the right foot has the same small orifice with thick callused tissue around this. However this time with removal of the callus tissue the wound is open all the way along the  incision line to the end medially. We have been using silver alginate. 3/13; left toe remains closed although the area is callused. He sees Dr. Jacqualyn Posey of podiatry next week. The area on the plantar right foot looked a lot better this week. Culture I did of this was negative we use silver alginate. He is going next week for an attempt at revascularization by [name] 3/23; left toe remains closed although the area is callused. He will see Dr. Jacqualyn Posey in follow-up. The area on the right plantar foot continues to look surprisingly better over the last 3 visits. We have been using silver alginate. The revascularization he was supposed to have by [name] at [facility] in [place] has been canceled [facility] procedure] 4/6; the right foot remains closed albeit callused. Podiatry canceled the appointment with regards to the left great toe. He has not seen [name] at [facility] but thinks that [name] has "done all he can do". He would be a candidate for the hemostaemix trial Readmission 07/29/2019 Mr. Stann Mainland is a man we know well from at least 3 previous stays in this clinic. He is a type II diabetic with severe PAD followed by [name] at [facility] in [place]. 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 So far everything in both of these areas has remained closed. About 2 weeks ago he went to see his podiatrist at [facility] Dr. Babs Bertin. He had a thick callus on the left fifth metatarsal head that was  shaved. He has developed an open wound in this area. They have been offloading this in his diabetic shoes. The patient has not had any more revascularizations by [name] since the last time he was here. ABI in our clinic at the posterior tibial on the left was 1.06 10/6; no real change in the area on the plantar met head. Small wound with 2 mm of depth. He has thick skin probably from pressure around the wound. We have been using silver alginate 10/13; small wound in the left fifth plantar met head. Arrives today with undermining laterally and purulent drainage. Our intake nurse cultured this. His wife stated they noticed a change in color over the last day or 2. He is not systemically unwell 10/19; small wound on the fifth plantar metatarsal head. Culture I did last week showed Staphylococcus lugdunensis. Although this could be a skin contaminant the possibility of a skin and soft tissue infection was there. I did give him empiric doxycycline which should have covered this. We are using silver alginate to the wound. We put him in a total contact cast today. 10/22; small wound on the fifth plantar metatarsal head. He has completed antibiotics. He also has severe PAD which worries me about just about any wound on this man's foot 10/29; small superficial area on the fifth plantar metatarsal head. Measuring slightly smaller. He is not currently on any antibiotics. I been using a total contact cast in this man with very severe PAD but he seems to be tolerating this well 11/5; left plantar fifth metatarsal head. Silver alginate being used under a total contact cast 11/12; wound not much different than last week. Using a #15 scalpel debridement around the wound. Still silver collagen under a total contact cast. He has severe PAD and that may be playing a role in  this 11/19; disappointing that the wound is not really changed that much. I think it is come down in overall surface area because  originally this was on the lateral part of the fifth metatarsal head however recently it is not really changed. Most of this is filled in but it will not epithelialized there is surface debris on this which may be mostly related to ischemia. They have an appointment with [name] on 12/9 09/24/2019 on evaluation today patient appears to be doing somewhat better with regard to the wound on the left fifth metatarsal head. Fortunately there does not appear to be any signs of active infection at this time. No fevers, chills, nausea, vomiting, or diarrhea. The wound does not appear to be completely closed and I do feel like the Hydrofera Blue is helping to some degree although I feel like the cast as well as keeping things from breaking down or getting any larger at least. As far as his wound overview it shows that the overall size of the wound is slightly smaller but really maintaining within the realm of about the same. He had no troubles with the cast which is good news. 12/1; left fifth metatarsal head. Perhaps somewhat more vibrant. We have been using Hydrofera Blue. He sees [name] at [place] 1 week tomorrow. He be back next week and I hope to have a better idea which way the wound is going however I will not cast him next week in case [name] wants to reevaluate things in his foot. The left leg is the leg with the stent in place and I wonder whether these are going to have to be reevaluated. 12/8; left fifth metatarsal head. Quite a bit better this week. Only a small open area remains. He sees [name] at [place] tomorrow. [name] is previously done his vascular interventions. He has 2 stents in the left leg as I remember things. I therefore will not put him in a total contact cast today. He will be using a forefoot off loader. We will use silver collagen on the wound. We will see him again next week 12/15; left fifth metatarsal head. He saw [name] and is scheduled for an angiogram on  Thursday. Unfortunately although his wound was a lot better last week it is really deteriorated this week. Small punched-out hole with depth and overhanging tissue. We have been using Hydrofera Blue 12/22; patient had an 80% stenosis proximal to the stent I believe in the below-knee popliteal artery. This was opened by [name]. According the patient the stent itself was satisfactory. I have not been able to review this note. 12/29; patient arrives today with the wound about the same size some undermining medially. We have been using Hydrofera Blue under a total contact cast and that is what we will do again today 11/04/2019. Slightly smaller in orifice but about 4 mm undermining almost circumferentially. We have been using Hydrofera Blue. Apparently the Hydrofera Blue did not stay on the wound after we removed the cast. Some minor looking cast irritation on the right lateral 1/12; unfortunately things did not go well this week. Arrives in clinic today with a deeper wound with undermining more concerning a area of pus. Specimen was obtained for culture. We are not going to be able to put him in a cast today. 1/19; purulent material last week which I cultured showed Enterobacter cloacae. He was on ampicillin previously prescribed by his primary doctor which should not have covered this  however mysteriously the wound looks somewhat better. There is less depth less erythema certainly no drainage. He will need a course of ciprofloxacin which I will provide today. 1/26; he has completed the ciprofloxacin. I don't think he needs any additional antibiotics. The areas on the left fifth plantar met head. We've been using silver alginate 2/2; comes in today with 0.4 cm of circumferential undermining around a small wound in this area. We have been using silver alginate with a forefoot offloading boot 2/9; again the wound appears larger nonviable surface and with 0.4 cm roughly of circumferential  undermining. We have been using silver alginate with a forefoot offloading boot. The wound does not look infected however his wife is quick to point out about swelling on the dorsal foot. 2/16; not much change. Comes in with a small wound with a nonviable necrotic surface. Again marked undermining that I had totally removed last week. He had a arterial Doppler last week in [name] office at [facility]. They have not heard these results. They are somewhat frustrated. 2/23; if anything this is worse. Undermining increased depth. Nonviable surface that looks pale. According his wife is TBI on the right was 0.22 although I have not verified this. He is going for an angiogram with [name] next Monday. We are using polymen and offloading in a forefoot offloading boot. 3/2; the patient was revascularized yesterday by [facility]. He had successful PTA of the left peroneal 60% to less than 20% and successful PTA of the left ATA 100% to less than 20%. He now has a pulse in the left foot Now that he has some additional blood flow there is not a lot of options here. On one hand we want activity to help keep blood flow augmented on the other hand pressure relief is going to be paramount if we have any chance to get this to close. After some discussion with his wife and patient I went ahead and put him in a total contact cast. 3/9; he arrives in clinic today with some debris over the wound surface. We use silver collagen under a total contact cast after his revascularization by [name] 3/16; wound about the same depth at 0.5 cm. Surface area perhaps slightly less but it is the depth the really is important. We have been using silver collagen 3/23; not much change here. There is still undermining medially the depth is about the same tissue looks somewhat better. We switched to Iodoflex last week to try to get a better looking wound surface. He does have a small abrasion over the proximal  interphalangeal joint dorsally. I this is so small it is difficult even to tell whether it is open. Some abrasion between the fifth toe and the webspace as well 3/30; if anything the wound is deeper this week and larger. Very disappointing. Under illumination debris on the surface which I debrided gently with a #3 curette there is minimal bleeding. We had been using iodoflex under a total contact cast. I am going to take him out of the cast and change him to [place] today which his wife will change daily. 4/6; wound bed looks better. Still undermining medially. Some maceration and erythema around the wound. We have been using Santyl change daily. I took him out of a total contact cast last week 4/13; patient's culture from last time grew MSSA. The wound is larger not a very viable surface. Duplex ultrasound I did last week rule out DVT was  negative. He still has a fair amount of pitting edema in the lower leg and dorsal foot but I am not really certain why. I started him on Keflex 500 every 6 for 10 days starting yesterday His wife tells me that [name] office in [place] has been in contact they want to look at restudying I believe to see if there is further procedures planned. I told his wife he still has a feeble dorsalis pedis pulse certainly not as robust as after the last procedure although this could be partially because of edema Electronic Signature(s) Signed: 02/10/2020 5:51:00 PM By: [name] MD Entered By: [name] on 02/10/2020 13:42:12 -------------------------------------------------------------------------------- Physical Exam Details Patient Name: Date of Service: [name], [name] 02/10/2020 12:30 PM Medical Record [id number] Patient Account Number: [id number] Date of Birth/Sex: Treating RN: 07-May-1946 (74 y.o. [name] Primary Care Provider: Other Clinician: Rory Guzman Referring Provider: Treating Provider/Extender:[name],  [name]: 27 Constitutional Sitting or standing Blood Pressure is within target range for patient.. Pulse regular and within target range for patient.Marland Kitchen Respirations regular, non-labored and within target range.. Temperature is normal and within the target range for the patient.Marland Kitchen Appears in no distress. Cardiovascular Very feeble dorsalis pedis pulse on the left. Still 2+ pitting edema in the dorsal foot into the mid calf. Notes Wound exam; certainly no improvement. A lot of nonviable debris washed off with Anasept and gauze. He has a deeper area at roughly 6:00 in the wound but no palpable bone. No obvious erythema around the wound Electronic Signature(s) Signed: 02/10/2020 5:51:00 PM By: [name] MD Entered By: [name] on 02/10/2020 13:43:29 -------------------------------------------------------------------------------- Physician Orders Details Patient Name: Date of Service: [name], [name] 02/10/2020 12:30 PM Medical Record [id number] Patient Account Number: [id number] Date of Birth/Sex: Treating RN: Apr 29, 1946 (74 y.o. [name] Primary Care Provider: Rory Guzman Other Clinician: Referring Provider: Treating Provider/Extender:[name], [name]: 27 Verbal / Phone Orders: No Diagnosis Coding ICD-10 Coding Code Description E11.621 Type 2 diabetes mellitus with foot ulcer E11.51 Type 2 diabetes mellitus with diabetic peripheral angiopathy without gangrene L97.521 Non-pressure chronic ulcer of other part of left foot limited to breakdown of skin Follow-up Appointments Return Appointment in 1 week. Dressing Change Frequency Wound #5 Left Metatarsal head fifth Change dressing every day. Wound Cleansing Wound #5 Left Metatarsal head fifth May shower and wash wound with soap and water. Primary Wound Dressing Wound #5 Left Metatarsal head fifth Calcium Alginate with Silver - apply bactroban to wound bed  under calcium alginate Ag. Other: - pad right great toe for protection Secondary Dressing Dry Gauze Foam Border Off-Loading Wedge shoe to: - with felt. apply to left foot. Additional Orders / Instructions Other: - closely monitor redness. Electronic Signature(s) Signed: 02/10/2020 5:51:00 PM By: [name] MD Signed: 02/10/2020 6:03:05 PM By: Deon Pilling Entered By: Deon Pilling on 02/10/2020 13:14:41 -------------------------------------------------------------------------------- Problem List Details Patient Name: Date of Service: [name], [name] 02/10/2020 12:30 PM Medical Record [id number] Patient Account Number: [id number] Date of Birth/Sex: Treating RN: [birth year] (74 y.o. [name] Primary Care Provider: Rory Guzman Other Clinician: Referring Provider: Treating Provider/Extender:[name], [name]: 28 Active Problems ICD-10 Evaluated Encounter Code Description Active Date Today Diagnosis E11.621 Type 2 diabetes mellitus with foot ulcer 07/29/2019 No Yes E11.51 Type 2 diabetes mellitus with diabetic peripheral 07/29/2019 No Yes angiopathy without gangrene L97.521 Non-pressure chronic ulcer of other part of left foot 07/29/2019 No Yes limited to breakdown of skin  Inactive Problems ICD-10 Code Description Active Date Inactive Date L03.116 Cellulitis of left lower limb 08/12/2019 08/12/2019 Resolved Problems Electronic Signature(s) Signed: 02/10/2020 5:51:00 PM By: [name] MD Entered By: [name] on 02/10/2020 13:37:39 -------------------------------------------------------------------------------- Progress Note Details Patient Name: Date of Service: [name], [name] 02/10/2020 12:30 PM Medical Record [id number] Patient Account Number: [id number] Date of Birth/Sex: Treating RN: Sep 07, 1946 (74 y.o. [name] Primary Care Provider: Rory Guzman Other Clinician: Referring Provider: Treating  Provider/Extender:[name], [name]: 28 Subjective History of Present Illness (HPI) 05/18/16; this is a 74year-old diabetic who is a type II diabetic on insulin. The history is that he traumatized his right foot developed a sore sometime in late March. Shortly thereafter he went on a cruise but he had to get off the cruise ship in [place] and fly urgently back to [place] where he was admitted to [facility] and ultimately underwent a transmetatarsal amputation by Dr. 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 According to the patient and his wife this wound never really healed. He was seen on 2 occasions in the wound care center in [place] and had vascular studies and then was referred urgently to Dr. Bridgett Guzman of vascular surgery. He underwent an angiogram on 05/10/16. Unfortunately nothing really could be done to improve his vascular status. He had a 75-90% stenosis in the midsegment of 1 segment of the posterior femoral artery. He had a patent popliteal, his anterior tibial occluded shortly after takeoff. Perineal had a greater than 90% stenosis posterior tibial is occluded feet had no distal collaterals feed distal aspect of the transmetatarsal amputation site. The patient tells me that he had a prolonged period of Santyl by Dr. Doran Guzman was some initial improvement but then this was stopped. I think they're only applying daily dressings/dry dressings. He has not had a recent x-ray of the right foot he did have one before his surgery in April. His wife by the dimensions of the wound/surgical site being followed at home since 4/20. At that point the dimensions were 0.5 x 12 x 0.2 on 7/19 this was 1.8 x 6 x 0.4. He is not currently on any antibiotics. His hemoglobin A1c in early April was 12.9 at that point he was started on insulin. Apparently his blood sugars are much lower he has an appointment with [name] of endocrine next week. 05/29/16 x-ray of  the area did not show osteomyelitis. I think he probably needs an MRI at this point. His wife is asking about something called"Yireh" cream which is not FDA approved. I have not heard of this. 06/22/16; MRI did not really suggest osteomyelitis. There was minimal marrow edema and enhancement in the stump of the second metatarsal felt to be secondary likely to postoperative change rather than osteomyelitis. The patient has arranged his own consultation with [name] at [facility], apparently their daughter lives in [place] and has some connection here. Any improvement in vascular supply by [name] would of course be helpful. Dr. Bridgett Guzman did not feel that anything further could be done other than amputation if wound care did not result in healing or if the area deteriorates. 06/26/16; the patient has been to see [name] at [place] and had an angiogram. He is going for a procedure on Thursday which will involve catheterization. I'm not sure if this is an anterograde or retrograde approach. He has been using Santyl to the wound 07/10/16; the patient had a repeat angiogram and angioplasty at [facility] by  Dr. Brunetta Guzman. His angiogram showed right CFA and profundal widely patent. The right as of a.m. popliteal artery were widely patent the right anterior tibial was occluded proximally and reconstitutes at the ankle. Peroneal artery was patent to the foot. Posterior tibial artery was occluded. The patient had angioplasty of the anterior tibial artery.. This was quite successful. He was recommended for Plavix as well as aspirin. 07/17/16; the patient was close to be a nurse visit today however the outer dressing of the Apligraf fell off. Noted drainage. I was asked to see the wound. The patient is noted an odor however his wife had noted that. Drainage with Apligraf not necessarily a bad thing. He has not been systemically unwell 07/24/16; we are still have an issue with drainage of this wound. In spite of this I applied  his second Apligraf. Medially the area still is probing to bone. 08/07/16; Apligraf reapplied in general wound looks improved. 08/21/16 Apligraf #4. Wound looks much better 09/04/16 patientt's wound again today continues to appear to improve with the application of the Apligraf's. He notes no increased discomfort or concerns at this point in time. 09/18/16; the patient returns today 2 weeks after his fifth application of Apligraf. Predictably three quarters of the width of this wound has healed. The deep area that probe to bone medially is still open. The patient asked how much out-of-pocket dollars would be for additional Apligraf's. 09/25/16; now using Hydrofera Blue. He has completed 5 Apligraf applications with considerable improvement in this deep open transmetatarsal amputation site. His wound is now a triangular-shaped wound on the medial aspect. At roughly 12 to 2:00 this probes another centimeter but as opposed to in the past this does not probe to bone. The patient has been seen at [place] by Dr. Zenia Guzman. He is not planning to do any more revascularization unless the wound stalls or worsens per the patient 10/02/16; 0.7 x 0.8 x 0.8. Unfortunately although the wound looks stable to improved. There is now easily probable bone. This hasn't been present for several weeks. Patient is not otherwise symptomatic he is not experiencing any pain. I did a culture of the wound bed 10/09/16. Deterioration last week. Culture grew MRSA and although there is improvement here with doxycycline prescribed over the phone I'm going to try to get him linezolid 600 twice a day for 10 days today. 10/16/16; he is completing a weeks worth of linezolid and still has 3 more days to go. Small triangular-shaped open area with some degree of undermining. There is still palpable bone with a curet. Overall the area appears better than last week 10/20/16 he has completed the linezolid still having some nausea and vomiting but  no diarrhea. He has exposed bone this week which is a deterioration. 10/27/16 patient now has a small but probing wound down to bone. Culture of this bone that I did last week showed a few methicillin-resistant staph aureus. I have little doubt that this represents acute/subacute osteomyelitis. The patient is currently on Doxy which I will continue he also completed 10 days of linezolid. We are now in a difficult situation with this patient's foot after considerable discussion we will send him back to see Dr. Doran Guzman for a surgical opinion of this I'm also going to try to arrange a infectious disease consult at [facility] hopefully week and get this prior to her usual 4-6 weeks we having [place]. The patient clearly is going to need 6 weeks of IV vancomycin. If we cannot arrange this  expediently I'll have to consider ordering this myself through a home infusion company 11/03/16; the patient now has a small in terms of circumference but probing wound. No bone palpable today. The patient remains on doxycycline 100 twice a day which should support him until he sees infectious disease at [facility] next week the following week on Wednesday I believe he has an appointment with [name] at [facility] who is his vascular cardiologist. Finally he has an appointment with Dr. Doran Guzman on 11/22/16 we have been using silver alginate. The patient's wife states they are having trouble getting this through [facility] 11/13/16; the patient was seen by infectious disease at [facility] in the 11th PICC line placed in preparation for IV antibiotics. A tummy he has not going to get IV vancomycin o Ceftaroline. They've also ordered an MRI. Patient has a follow-up with Dr. Doran Guzman on 11/22/16 and [name] at [facility] tomorrow 11/23/16 the patient is on daptomycin as directed by infectious disease at [facility]. He is also been back to see [name] at [facility]. He underwent a repeat arteriogram. He had a successful PTA of the right  anterior tibial artery. He is on dual antiplatelete Guzman with Plavix and aspirin. Finally he had the MRI of his foot in [place]. This showed cellulitis about the foot worse distally edema and enhancement in the reminiscent of the second metatarsal was consistent with osteomyelitis therefore what I was assuming to be the first metatarsal may be actually the second. He also has a fluid collection deep to the calcaneus at the level of the calcaneal spur which could be an abscess or due to adventitial bursitis. He had a small tear in his Achilles 11/30/16; the patient continues on daptomycin as directed by infectious disease at [facility]. He is been revascularized by [name] at [facility] in [place]. He has been to see Gretta Arab who was the orthopedic surgeon who did his original amputation. I have not seen his not however per the patient's wife he did not offer another surgical local surgical prodecure to remove involved bone. He verbalized his usual disbelief in not just hyperbarics but any medical therapy for this condition(osteomyelitis). I discussed this in detail with the patient today including answering the question about a BKA definitively "curing" the current condition. 12/07/16; the patient continues on daptomycin as directed by infectious disease at [facility]. This is directed at the MRSA that we cultured from his bone debridement from 12/22. Lab work today shows a white count of 8.7 hemoglobin of 10.5 which is microcytic and hypochromic differential count shows a slightly elevated monocyte count at 1.2 eosinophilic count of 0.6. His creatinine is 1.11 sedimentation rate apparently is gone from 35-34 now 40. I explained was wife I don't think this represents a trend. His total CK is 48 12/14/16- patient is here for follow-up evaluation of his right TMA site. He continues to receive IV daptomycin per infectious disease. His serum inflammatory markers remain elevated. He complains of  intermittent pain to the medial aspect of the TMA site with intermittent erythema. He voices no complaints or concerns regarding hyperbaric therapy. Overall he and his wife are expressing a frustration and discouragement regarrding the length of time of Guzman. 12/21/16; small open wound at roughly the first or second metatarsal metatarsalphalyngeal joint reminiscence of his transmetatarsal amputation site he continues to receive IV daptomycin per infectious disease at [facility]. He will finish these a week tomorrow. He has lab work which I been copied on. His  white count is 10.8 hemoglobin 8.9 MCV is low at 75., MCH low at 24.5 platelet count slightly elevated at 626. Differential count shows 70% neutrophils 10% monocytes and 10% eosinophils. His comprehensive metabolic panel shows a slightly low sodium at 133 albumin low at 3.1 total CK is normal at 42 sedimentation rate is much higher at 82. He has had iron studies that show a serum iron of 15 and iron binding capacity of 238 and iron saturation of 6. This is suggestive of iron deficiency. B12 and folate were normal ferritin at 113 The patient tells me that he is not eating well and he has lost weight. He feels episodically nauseated. He is coughing and gagging on mucus which she thinks is sinusitis. He has an appointment with his primary doctor at 5:00 this afternoon in [facility] 01/02/17; the patient developed a subacute pneumonitis. He was admitted to [facility] after a CT scan showed an extensive interstitial pneumonitis [I have not yet seen this]. 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 He has since been discharged. He is not on oxygen. He feels fatigued and very short of breath with exertion. At [facility] the wound care nurse there felt that his wound was healed. He did complete his daptomycin and has follow-up with  infectious disease on Friday. X-rays I did before he went to [facility] still suggested residual osteomyelitis in the anterior aspect of the second must metatarsal head. I'm not sure I would've expected any different. There was no other findings. I actually think I did this because of erythema over the first metatarsal head reminiscent 01/11/17; the patient has eosinophilic pneumonitis. He has been reviewed by pulmonology and given clearance for hyperbarics at least that's what his wife says. I'll need to see if there is note in care everywhere. Apparently the prognosis for improvement of daptomycin induced eosinophilic granulocyte is is 3 months without steroids. In the meantime infectious disease has placed him on doxycycline until the wound is closed. He is still is lost a lot of weight and his blood sugars are running in the mid 60s to low 80s fasting and at all times during the day 01/18/17; he has had adjustments in his insulin apparently his blood sugars in the morning or over 100. He wants to restart his hyperbaric Guzman we'll do this at 1:00. 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 I think there is good reason to complete his treatments in order to give him the best chance of maintaining a healed status and these DFU 3 wounds with MRSA infection in the bone 02/15/17; the patient was seen today in conjunction with HBO. He completed hyperbaric oxygen today. The open area on his transmetatarsal site has remained closed. There was an area of erythema when I saw him earlier in the week on the posterior heel although that is resolved as of today as well. He has been using a cam walker. This is a patient who came to [place] after a transmetatarsal amputation that was necrotic and dehisced. He required revascularization percutaneously on 2 different occasions by [name] of invasive cardiology at [facility].  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 He went to [facility] infectious disease and had IV daptomycin for 5 weeks before developing eosinophilic pneumonitis and requiring an  admission to hospital/ICU. He made a good recovery and is continued on doxycycline since. As mentioned his foot is closed now. He has a follow-up with [name] tomorrow. He is going to [employer] on Monday for a custom-made shoe READMISSION Last visit [name] [facility] 02/16/17 1. Critical limb ischemia of the RLE:  s/p transmetatarsal amputation of the RLE, now completely healed.  s/p right ATA percutaneous revascularization procedure x2, most recently 11/20/2016 s/p PTA of right AT 100% to less than 20% with a 2.5 x 200 balloon.  He does have some residual osteomyelitis, but given the wound is closed, the orthopedist has recommended to follow. He has a fitting for a special shoe coming up next week. He has completed a course of daptomycin and doxycycline and he has been discharged by ID. He has completed a course of hyperbaric therapy.  Continue Continue medical management with aspirin, Plavix and statin therapy. We will discuss ongoing Plavix therapy at follow-up in 6 months.  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 Given no symptoms, we will conservatively manage. He doesn't want to do any more invasive studies at this time, which is reasonable. The patient arrives today out of 2 concerns both on the right transmetatarsal site. 1 at the level of the reminiscent fifth metatarsal head and the other at roughly the first or second. Both of these look like dark subcutaneous discoloration probably subdermal bleeding. He is recently obtained new adaptive footwear for the right foot. He  also has a callus on the left fifth dorsal toe however he follows with podiatry for this and I don't think this is any issue. ABIs in this clinic today were 0.66 on the right and 1.06 on the left. On entrance into our clinic initially this was 0.95 and 0.92. He does not describe current claudication. They're going away on a cruise in 8 weeks and I think are trying to do the month is much as they can proactively. They follow with podiatry and have an appointment with [name] in October READMISSION 06/25/18 This is a patient that we have not seen in almost a year. He is a type II diabetic with known PAD. He is followed by [name] of interventional cardiology at [facility] in [place]. He is required revascularization for significant PAD. When we first saw him he required a transmetatarsal amputation. He had underlying osteomyelitis with a nonhealing surgical wound. This eventually closed with wound care, IV antibiotics and hyperbaric oxygen. They tell me that he has a modified shoe and he is very active walking up to 4 miles a day. He is followed by Dr. Geroge Baseman of podiatry. His wife states that he underwent a removal of callus over this site on July 9. She felt there may be drainage from this site after that although she could never really determined and there was callus buildup again. On 06/01/18 there was pressure bleeding through the overlying callus. he was given a prescription for 7 days of Bactrim. Fortuitously he has an appointment with [name] on 06/04/18 and he immediately underwent revascularization of the right leg although I have not had a chance to review these records in care everywhere. This was apparently done through anterior and retrograde access. On 06/06/18 he had another debridement by Dr. Bernette Mayers. Vitamin soaking with Epsom salts for 20 minutes and applying calcium alginate. The original trans-met  was on April 2017 I believe by Dr. Doran Guzman. His ABI in our clinic was  noncompressible today. 07/02/18; x-ray I ordered last week was negative for osteomyelitis. Swab culture was also negative. He is going to require an MRI which I have ordered today. 07/09/18; surprisingly the MRI of the foot that I ordered did not show osteomyelitis. He did suggest the possibility of cellulitis. For this reason I'll go ahead and give him a 10 day course of doxycycline. Although the previous culture of this area was negative 07/16/18; it arrives with the wound looking much the same. Roughly the same depth. He has thick subcutaneous tissue around the wound orifice but this still has roughly the same depth. Been using silver alginate. We applied Oasis #1 today 07/23/2018; still having to remove a lot of callus and thick subcutaneous tissue to actually define the wound here. Most of this seems to have closed down yet he has a comma shaped divot over the top of the area that still I think is open. We applied Oasis #2 His wife expressed concern about the tip of his left great toe. This almost looks like a small blister. She also showed it today to her podiatrist Dr. Geroge Baseman who did not think this was anything serious. I am not sure is anything serious either however I think it bears some watching. He is not in any pain however he is insensate 07/30/2018;; still on a lot of nonviable tissue over the surface of the wound however cleaning this up reveals a more substantial wound orifice but less of a probing wound depth. This is not probed to bone. There is no evidence of infection The wife is still concerned about a non-open area on the tip of his left great toe. Almost feels like a bony outgrowth. She had previously showed this to podiatry. I do not think this is a blister. A friction area would be possible although he is really not walking according to his wife. He had a small skin tag on his right buttock but no open wound here either 08/06/2018; we applied a third Oasis last week.  Unfortunately there is really no improvement. Still requiring extensive debridement to expose the wound bed from a horizontal slitlike depression. I still have not been able to get this to fill in properly. On the positive side there is now no probable bone from when he first came into the facility. I changed him to silver alginate today after a reasonably aggressive debridement 08/13/2018; once again the patient comes in with skin and subcutaneous tissue closing over the small probing area with the underlying cavity of the wound on the right TMA site. I applied silver alginate to this last week. Prior to that we used Oasis x3 still not able to get this area granulating. He does not have a probing area of the bone which is an improvement from when I spur started working on this and an MRI did not suggest osteomyelitis. I do not see evidence of infection here but I am increasingly concerned about why I cannot get this area to granulate. Each time I debrided this is looks like this is simply a matter of getting granulation to fill in the hole and then getting epithelialization. This does not seem to happen Also I sent him back to see podiatry Dr. Earleen Newport about the what felt to be bony outgrowth on the tip of his left great toe. Apparently after heel he left the clinic last week or the next  day he developed a blood blister. He did see Dr. Earleen Newport. He went on to have a debridement of the medial nail cuticle he now has an open area here as well as some denuded skin. They did an x-ray apparently does have a bony outgrowth or spur but I am not able to look at this. They are using topical antibiotics apparently there was some suggested he use Santyl. Patient's wife was anxious for my opinion of this 08/20/2018; we are able to keep the wound open this time instead of the thick subcutaneous tissue closing over the top of it however unfortunately once again this probes to bone. I do not see any evidence of  infection and previous MRI did not show osteomyelitis. I elected to go back to the Oasis to see if we can stimulate some granulation. Last saw his vascular interventional cardiologist [name] at the beginning of August and he had a repeat procedure. Nevertheless I wonder how much blood flow he has down to this area With regards to the left first toe he is seeing Dr. Earleen Newport next week. They are applying Bactroban to this area. 08/27/2018 ooOnce again he comes in with thick eschar and subcutaneous tissue over the top of the small probing hole. This does not appear to go down to bone but it still has roughly the same depth. I reapplied Oasis today ooOver the left great toe there appears to be more of the wound at the tip of his toe than there was last week. This has an eschar on the surface of it. Will change to Santyl. There is seeing podiatry this afternoon 09/03/2018 ooHe comes in today with the area on the transmetatarsal's site with a fair amount of callus, nonviable tissue over the circumference but it was not closed. I removed all of this as well as some subcutaneous debris and reapplied Oasis. There is no exposed bone ooThe area over the tip of the left great toe started off as a nodule of uncertain etiology. He has been followed with podiatry. They have been removing part of the medial nail bed. He has nonviable tissue over the wound he been using Santyl in this area 09/10/18 ooUnfortunately comes in with neither wound area looking improved. The transmetatarsal amputation site once Again has nonviable debris over the surface requiring debridement. Unfortunately underneath this there is nothing that looks viable and this once again goes right down to bone. There is no purulent drainage and no erythema. ooAlso the surgical wound from podiatry on the left first toe has an ischemic-looking eschar over the surface of the tip of the toe I have elected not to attempt candidly debride  this ooHis wife as arranged for him to have follow-up noninvasive studies in [place] under the care of [name] clinic. The question is he has known severe PAD. They had recently seen him in August. He has had revascularizations in both legs within the last 4 or 5 months. He is not complaining of pain and I cannot really get a history of claudication. He has not systemically unwell 09/20/2018 the patient has been to [facility] and been revascularized by [name] earlier this week. Apparently he was able to open up the anterior tibial artery although I have not actually seen his formal report. He is going for an attempt to revascularize on the left on Monday. He is apparently working with an investigational stent for lower extremity arteries below the knee and he has talked to the patient about  placing that on Monday if possible. The patient has been using silver alginate on the transmetatarsal amputation site and Santyl on the left 09/30/2018; patient had his revascularization on the left this apparently included a standard approach as well as a more distal arterial catheterization although I do not have any information on this from [name]. In fact I do not even see the initial revascularization that he had on the right. I have included the arterial history from [name] last note however below; ASSESSMENT/PLAN: 1. Hx of Critical limb ischemia bilateral lower extremities, PAD: -s/p transmetatarsal amputation of the RLE. -s/p right ATA percutaneous revascularization procedure x2, most recently 11/20/2016 s/p PTA of right AT 100% to less than 20% with a 2.5 x 200 balloon. 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 -03/21/2018 s/p PTA of 90% left popliteal artery to <10% with a 5x20 cutting balloon, PTA of 90% left peroneal to <20% with a 3x20 balloon, PTA of  100% left AT to <20% with a 2.5x220 balloon. 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 Will plan on the LLE soon after. Risks/benefits of procedure have been discussed and patient has elected to proceed. We have been using endoform to the right TMA amputation site wound and Santyl to the left great toe 10/07/2018; the area on the tip of his left great toe looked better we have been using Santyl here. We continue to have a very difficult probing hole on the right TMA amputation site we have been using endoform. I went on to use his fifth Oasis today 10/14/2018; the tip of the left great toe continues to look better. We have been using Santyl here the surface however is healthy and I think we can change to an alginate. The right TMA has not changed. Once again he has no superficial opening there is callus and thick subcutaneous tissue over the orifice once you remove this there is the probing area that we have been dealing with without too much change. There is no palpable bone I have been placing Oasis here and put Oasis #6 in this today after a more vigorous debridement 10/21/18; the left great toe still has necrotic surface requiring debridement. The right TMA site hasn't changed in view of the thick callus over the wound bed. With removal of this there is still the opening however this does not appear to have the same depth. Again there is no palpable bone. Oasis was replaced 10/28/2018; patient comes in with both wounds looking worse. The area over the first toe tip is now down to bone. The area over the TMA site is deeper and down to bone clearly with a increase in overall wound area. Equally concerning on the right TMA is the complete absence of a pulse this week which is a change. We are not even able to Doppler this. On the right he has a  noncompressible ABI greater than 1.4 11/04/2018. Both wounds look somewhat worse. X-rays showed no osteomyelitis of the right foot but on the left there was underlying osteomyelitis in the left great toe distal phalanx. I been on the phone to [name] surface at [facility] in [place] and they are arranging for another angiogram on the right on Monday. I  have him on doxycycline for the osteomyelitis in the left great toe for now. Infectious disease may be necessary 1/17; 2-week hiatus. Patient was admitted to hospital at [facility]. My understanding is he underwent an angioplasty of the right anterior tibial artery and had stents placed in the left anterior artery and the left tibial peroneal trunk. This was done by [name] of interventional radiology. There is no major change in either 1 of the wounds. They have been using Aquacel Ag. As far as they are aware no imaging studies were done of the foot which is indeed unfortunate. I had him on doxycycline for 2 weeks since we identified the osteomyelitis in the left great toe by plain x-ray. I have renewed that again today. I am still suspicious about osteomyelitis in the amputation site and would consider doing another MRI to compare with the one done in September. The idea of hyperbaric oxygen certainly comes up for discussion 1/24; no major change in either wound area. I have him on doxycycline for osteomyelitis at the tip of the left great toe. As noted he has been previously and recently revascularized by [name] at [place]. He is tolerating the doxycycline well. For some reason we do not have an infectious disease consult yet. Culture of drainage from the right foot site last week was negative 1/31; MRI of the right foot did not show osteomyelitis of the right ankle and foot. Notable for a skin ulceration overlying the second metatarsal stump with generalizing soft tissue edema of the ankle and foot consistent with cellulitis. Noted to have a  partial-thickness tear of the Achilles tendon 7.5 cm proximal to the insertion Nothing really new in terms of symptoms. Patient's area on the tip of the left great toe is just about closed although he still has the probing area on the metatarsal amputation site. Appointment with Dr. Linus Salmons of infectious disease next week 2/7; Dr. Novella Olive did not feel that any further antibiotics were necessary he would follow-up in 2 months. The left great toe appears to be closed still some surface callus that I gently looked under high did not see anything open or anything that was threatening to be open. He still has the open area on the mid part of his TMA site using endoform 2/14; left great toe is closed and he is completing his doxycycline as of last Sunday. He is not on any antibiotics. Unfortunately out of the right foot his wife noticed some subdermal hemorrhage this week. He had been walking 2 miles I had given him permission to do so this is not really a plantar wound. Using endoform to this wound but I changed to silver alginate this week 2/21; left great toe remains closed. Culture last week grew Streptococcus angiosis which I am not really familiar with however it is penicillin sensitive and I am going to put him on Augmentin. Not much change in the wound on the right foot the deep area is still probing precariously close to bone and the wound on the margin of the TMA is larger. 2/28; left great toe remains closed. He is completing the Augmentin I gave him last week. 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 This is being shown to [name] at [facility] to see if there is anything else that can be done here. He has been using silver alginate strips on the right 3/6; left great toe remains closed. He sees [name] on Monday. The area on  the plantar aspect of the right foot has the same small orifice with thick callused tissue around this. However this time with removal  of the callus tissue the wound is open all the way along the incision line to the end medially. We have been using silver alginate. 3/13; left toe remains closed although the area is callused. He sees Dr. Jacqualyn Posey of podiatry next week. The area on the plantar right foot looked a lot better this week. Culture I did of this was negative we use silver alginate. He is going next week for an attempt at revascularization by [name] 3/23; left toe remains closed although the area is callused. He will see Dr. Jacqualyn Posey in follow-up. The area on the right plantar foot continues to look surprisingly better over the last 3 visits. We have been using silver alginate. The revascularization he was supposed to have by [name] at [facility] in [place] has been canceled [facility] procedure] 4/6; the right foot remains closed albeit callused. Podiatry canceled the appointment with regards to the left great toe. He has not seen [name] at [facility] but thinks that [name] has "done all he can do". He would be a candidate for the hemostaemix trial Readmission 07/29/2019 Mr. Stann Mainland is a man we know well from at least 3 previous stays in this clinic. He is a type II diabetic with severe PAD followed by [name] at [facility] in [place]. 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 So far everything in both of these areas has remained closed. About 2 weeks ago he went to see his podiatrist at [facility] Dr. Babs Bertin. He had a thick callus on the left fifth metatarsal head that was shaved. He has developed an open wound in this area. They have been offloading this in his diabetic shoes. The patient has not had any more revascularizations by [name] since the last time he was here. ABI in our clinic at the posterior tibial on the left was 1.06 10/6; no real change in the area on the  plantar met head. Small wound with 2 mm of depth. He has thick skin probably from pressure around the wound. We have been using silver alginate 10/13; small wound in the left fifth plantar met head. Arrives today with undermining laterally and purulent drainage. Our intake nurse cultured this. His wife stated they noticed a change in color over the last day or 2. He is not systemically unwell 10/19; small wound on the fifth plantar metatarsal head. Culture I did last week showed Staphylococcus lugdunensis. Although this could be a skin contaminant the possibility of a skin and soft tissue infection was there. I did give him empiric doxycycline which should have covered this. We are using silver alginate to the wound. We put him in a total contact cast today. 10/22; small wound on the fifth plantar metatarsal head. He has completed antibiotics. He also has severe PAD which worries me about just about any wound on this man's foot 10/29; small superficial area on the fifth plantar metatarsal head. Measuring slightly smaller. He is not currently on any antibiotics. I been using a total contact cast in this man with very severe PAD but he seems to be tolerating this well 11/5; left plantar fifth metatarsal head. Silver alginate being used under a total contact  cast 11/12; wound not much different than last week. Using a #15 scalpel debridement around the wound. Still silver collagen under a total contact cast. He has severe PAD and that may be playing a role in this 11/19; disappointing that the wound is not really changed that much. I think it is come down in overall surface area because originally this was on the lateral part of the fifth metatarsal head however recently it is not really changed. Most of this is filled in but it will not epithelialized there is surface debris on this which may be mostly related to ischemia. They have an appointment with [name] on 12/9 09/24/2019 on evaluation  today patient appears to be doing somewhat better with regard to the wound on the left fifth metatarsal head. Fortunately there does not appear to be any signs of active infection at this time. No fevers, chills, nausea, vomiting, or diarrhea. The wound does not appear to be completely closed and I do feel like the Hydrofera Blue is helping to some degree although I feel like the cast as well as keeping things from breaking down or getting any larger at least. As far as his wound overview it shows that the overall size of the wound is slightly smaller but really maintaining within the realm of about the same. He had no troubles with the cast which is good news. 12/1; left fifth metatarsal head. Perhaps somewhat more vibrant. We have been using Hydrofera Blue. He sees [name] at [place] 1 week tomorrow. He be back next week and I hope to have a better idea which way the wound is going however I will not cast him next week in case [name] wants to reevaluate things in his foot. The left leg is the leg with the stent in place and I wonder whether these are going to have to be reevaluated. 12/8; left fifth metatarsal head. Quite a bit better this week. Only a small open area remains. He sees [name] at [place] tomorrow. [name] is previously done his vascular interventions. He has 2 stents in the left leg as I remember things. I therefore will not put him in a total contact cast today. He will be using a forefoot off loader. We will use silver collagen on the wound. We will see him again next week 12/15; left fifth metatarsal head. He saw [name] and is scheduled for an angiogram on Thursday. Unfortunately although his wound was a lot better last week it is really deteriorated this week. Small punched-out hole with depth and overhanging tissue. We have been using Hydrofera Blue 12/22; patient had an 80% stenosis proximal to the stent I believe in the below-knee popliteal artery. This was opened by  [name]. According the patient the stent itself was satisfactory. I have not been able to review this note. 12/29; patient arrives today with the wound about the same size some undermining medially. We have been using Hydrofera Blue under a total contact cast and that is what we will do again today 11/04/2019. Slightly smaller in orifice but about 4 mm undermining almost circumferentially. We have been using Hydrofera Blue. Apparently the Hydrofera Blue did not stay on the wound after we removed the cast. Some minor looking cast irritation on the right lateral 1/12; unfortunately things did not go well this week. Arrives in clinic today with a deeper wound with undermining more concerning a area of pus. Specimen was obtained for culture. We are not going  to be able to put him in a cast today. 1/19; purulent material last week which I cultured showed Enterobacter cloacae. He was on ampicillin previously prescribed by his primary doctor which should not have covered this however mysteriously the wound looks somewhat better. There is less depth less erythema certainly no drainage. He will need a course of ciprofloxacin which I will provide today. 1/26; he has completed the ciprofloxacin. I don't think he needs any additional antibiotics. The areas on the left fifth plantar met head. We've been using silver alginate 2/2; comes in today with 0.4 cm of circumferential undermining around a small wound in this area. We have been using silver alginate with a forefoot offloading boot 2/9; again the wound appears larger nonviable surface and with 0.4 cm roughly of circumferential undermining. We have been using silver alginate with a forefoot offloading boot. The wound does not look infected however his wife is quick to point out about swelling on the dorsal foot. 2/16; not much change. Comes in with a small wound with a nonviable necrotic surface. Again marked undermining that I had totally removed last  week. He had a arterial Doppler last week in [name] office at [facility]. They have not heard these results. They are somewhat frustrated. 2/23; if anything this is worse. Undermining increased depth. Nonviable surface that looks pale. According his wife is TBI on the right was 0.22 although I have not verified this. He is going for an angiogram with [name] next Monday. We are using polymen and offloading in a forefoot offloading boot. 3/2; the patient was revascularized yesterday by [facility]. He had successful PTA of the left peroneal 60% to less than 20% and successful PTA of the left ATA 100% to less than 20%. He now has a pulse in the left foot Now that he has some additional blood flow there is not a lot of options here. On one hand we want activity to help keep blood flow augmented on the other hand pressure relief is going to be paramount if we have any chance to get this to close. After some discussion with his wife and patient I went ahead and put him in a total contact cast. 3/9; he arrives in clinic today with some debris over the wound surface. We use silver collagen under a total contact cast after his revascularization by [name] 3/16; wound about the same depth at 0.5 cm. Surface area perhaps slightly less but it is the depth the really is important. We have been using silver collagen 3/23; not much change here. There is still undermining medially the depth is about the same tissue looks somewhat better. We switched to Iodoflex last week to try to get a better looking wound surface. He does have a small abrasion over the proximal interphalangeal joint dorsally. I this is so small it is difficult even to tell whether it is open. Some abrasion between the fifth toe and the webspace as well 3/30; if anything the wound is deeper this week and larger. Very disappointing. Under illumination debris on the surface which I debrided gently with a #3 curette there  is minimal bleeding. We had been using iodoflex under a total contact cast. I am going to take him out of the cast and change him to [place] today which his wife will change daily. 4/6; wound bed looks better. Still undermining medially. Some maceration and erythema around the wound. We have been using Santyl change daily. I  took him out of a total contact cast last week 4/13; patient's culture from last time grew MSSA. The wound is larger not a very viable surface. Duplex ultrasound I did last week rule out DVT was negative. He still has a fair amount of pitting edema in the lower leg and dorsal foot but I am not really certain why. I started him on Keflex 500 every 6 for 10 days starting yesterday His wife tells me that [name] office in [place] has been in contact they want to look at restudying I believe to see if there is further procedures planned. I told his wife he still has a feeble dorsalis pedis pulse certainly not as robust as after the last procedure although this could be partially because of edema Objective Constitutional Sitting or standing Blood Pressure is within target range for patient.. Pulse regular and within target range for patient.Marland Kitchen Respirations regular, non-labored and within target range.. Temperature is normal and within the target range for the patient.Marland Kitchen Appears in no distress. Vitals Time Taken: 12:42 PM, Height: 69 in, Source: Stated, Weight: 210 lbs, Source: Stated, BMI: 31, Temperature: 98.2 F, Pulse: 76 bpm, Respiratory Rate: 18 breaths/min, Blood Pressure: 127/78 mmHg, Capillary Blood Glucose: 78 mg/dl. General Notes: glucose per pt report this am Cardiovascular Very feeble dorsalis pedis pulse on the left. Still 2+ pitting edema in the dorsal foot into the mid calf. General Notes: Wound exam; certainly no improvement. A lot of nonviable debris washed off with Anasept and gauze. He has a deeper area at roughly 6:00 in the wound but no palpable bone. No  obvious erythema around the wound Integumentary (Hair, Skin) Wound #5 status is Open. Original cause of wound was Gradually Appeared. The wound is located on the Left Metatarsal head fifth. The wound measures 2cm length x 1.8cm width x 0.3cm depth; 2.827cm^2 area and 0.848cm^3 volume. There is Fat Layer (Subcutaneous Tissue) Exposed exposed. There is no tunneling noted, however, there is undermining starting at 12:00 and ending at 12:00 with a maximum distance of 0.2cm. There is a medium amount of serosanguineous drainage noted. Foul odor after cleansing was noted. The wound margin is well defined and not attached to the wound base. There is no granulation within the wound bed. There is a large (67- 100%) amount of necrotic tissue within the wound bed including Adherent Slough. 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 Dressing Change Frequency: Wound #5 Left Metatarsal head fifth: Change dressing every day. Wound Cleansing: Wound #5 Left Metatarsal head fifth: May shower and wash wound with soap and water. Primary Wound Dressing: Wound #5 Left Metatarsal head fifth: Calcium Alginate with Silver - apply bactroban to wound bed under calcium alginate Ag. Other: - pad right great toe for protection Secondary Dressing: Dry Gauze Foam Border Off-Loading: Wedge shoe to: - with felt. apply to left foot. Additional Orders / Instructions: Other: - closely monitor redness. 1. Silver alginate to the wound surface with topical Bactroban underneath 2. Hopefully [name] office will bring him back for review of the arterial status 3. Certainly the MSSA infection is caused further tissue damage. We are using topical silver alginate and  Bactroban as well as continuing him on Keflex. Of course the arterial status here compromises attempts to heal this Electronic Signature(s)  Signed: 02/10/2020 5:51:00 PM By: [name] MD Entered By: [name] on 02/10/2020 13:44:35 -------------------------------------------------------------------------------- SuperBill Details Patient Name: Date of Service: [name], [name] 02/10/2020 Medical Record [id number] Patient Account Number: [id number] Date of Birth/Sex: Treating RN: [birth year] (74 y.o. [name] Primary Care Provider: Rory Guzman Other Clinician: Referring Provider: Treating Provider/Extender:[name], [name]: 27 Diagnosis Coding ICD-10 Codes Code Description E11.621 Type 2 diabetes mellitus with foot ulcer E11.51 Type 2 diabetes mellitus with diabetic peripheral angiopathy without gangrene L97.521 Non-pressure chronic ulcer of other part of left foot limited to breakdown of skin Facility Procedures The patient participates with Medicare or their insurance follows the Medicare Facility Guidelines: CPT4 Code Description Modifier Quantity [id number] [place] VISIT-LEV 4 EST PT 1 Physician Procedures CPT4 Code Description: [id number] - WC PHYS LEVEL 3 - EST PT ICD-10 Diagnosis Description E11.621 Type 2 diabetes mellitus with foot ulcer E11.51 Type 2 diabetes mellitus with diabetic peripheral angiopath Modifier: y without gan Quantity: 1 grene Electronic Signature(s) Signed: 02/10/2020 5:51:00 PM By: [name] MD Signed: 02/10/2020 6:03:05 PM By: Deon Pilling Entered By: Deon Pilling on 02/10/2020 13:47:49

## 2020-02-10 NOTE — Progress Notes (Signed)
[name], [name] ([id number]) Visit Report for 02/10/2020 Arrival Information Details Patient Name: Date of Service: [name], [name] 02/10/2020 12:30 PM Medical Record [id number] Patient Account Number: [id number] Date of Birth/Sex: Treating RN: 1946-04-22 (74 y.o. [name] Primary Care Marvis Saefong: [name] Other Clinician: Referring Terriah Reggio: Treating Shawntina Diffee/Extender:[name], [name], [name] in Treatment: 27 Visit Information History Since Last Visit Added or deleted any medications: No Patient Arrived: Ambulatory Any new allergies or adverse reactions: No Arrival Time: 12:41 Had a fall or experienced change in No Accompanied By: spouse activities of daily living that may affect Transfer Assistance: None risk of falls: Patient Identification Verified: Yes Signs or symptoms of abuse/neglect since No Secondary Verification Process Yes last visito Completed: Hospitalized since last visit: No Patient Requires Transmission-Based No Implantable device outside of the clinic No Precautions: excluding Patient Has Alerts: No cellular tissue based products placed in the center since last visit: Has Dressing in Place as Prescribed: Yes Has Footwear/Offloading in Place as Yes Prescribed: Left: Wedge Shoe Pain Present Now: Yes Electronic Signature(s) Signed: 02/10/2020 6:08:25 PM By: Baruch Gouty RN, BSN Entered By: Baruch Gouty on 02/10/2020 12:42:53 -------------------------------------------------------------------------------- Clinic Level of Care Assessment Details Patient Name: Date of Service: [name], [name] 02/10/2020 12:30 PM Medical Record [id number] Patient Account Number: [id number] Date of Birth/Sex: Treating RN: 1946-03-18 (74 y.o. Lorette Ang, Meta.Reding Primary Care Jacquese Hackman: [name] Other Clinician: Referring Justyce Baby: Treating Korbyn Chopin/Extender:[name], [name], [name] in Treatment: 28 Clinic Level of Care  Assessment Items TOOL 4 Quantity Score X - Use when only an EandM is performed on FOLLOW-UP visit 1 0 ASSESSMENTS - Nursing Assessment / Reassessment X - Reassessment of Co-morbidities (includes updates in patient status) 1 10 X - Reassessment of Adherence to Treatment Plan 1 5 ASSESSMENTS - Wound and Skin Assessment / Reassessment []  - Simple Wound Assessment / Reassessment - one wound 0 X - Complex Wound Assessment / Reassessment - multiple wounds 1 5 X - Dermatologic / Skin Assessment (not related to wound area) 1 10 ASSESSMENTS - Focused Assessment X - Circumferential Edema Measurements - multi extremities 1 5 X - Nutritional Assessment / Counseling / Intervention 1 10 []  - Lower Extremity Assessment (monofilament, tuning fork, pulses) 0 []  - Peripheral Arterial Disease Assessment (using hand held doppler) 0 ASSESSMENTS - Ostomy and/or Continence Assessment and Care []  - Incontinence Assessment and Management 0 []  - Ostomy Care Assessment and Management (repouching, etc.) 0 PROCESS - Coordination of Care []  - Simple Patient / Family Education for ongoing care 0 X - Complex (extensive) Patient / Family Education for ongoing care 1 20 X - Staff obtains Programmer, systems, Records, Test Results / Process Orders 1 10 []  - Staff telephones HHA, Nursing Homes / Clarify orders / etc 0 []  - Routine Transfer to another Facility (non-emergent condition) 0 []  - Routine Hospital Admission (non-emergent condition) 0 []  - New Admissions / Biomedical engineer / Ordering NPWT, Apligraf, etc. 0 []  - Emergency Hospital Admission (emergent condition) 0 []  - Simple Discharge Coordination 0 X - Complex (extensive) Discharge Coordination 1 15 PROCESS - Special Needs []  - Pediatric / Minor Patient Management 0 []  - Isolation Patient Management 0 []  - Hearing / Language / Visual special needs 0 []  - Assessment of Community assistance (transportation, D/C planning, etc.) 0 []  - Additional assistance /  Altered mentation 0 []  - Support Surface(s) Assessment (bed, cushion, seat, etc.) 0 INTERVENTIONS - Wound Cleansing / Measurement []  - Simple Wound Cleansing - one wound 0 X - Complex Wound  Cleansing - multiple wounds 1 5 X - Wound Imaging (photographs - any number of wounds) 1 5 []  - Wound Tracing (instead of photographs) 0 []  - Simple Wound Measurement - one wound 0 X - Complex Wound Measurement - multiple wounds 1 5 INTERVENTIONS - Wound Dressings X - Small Wound Dressing one or multiple wounds 1 10 []  - Medium Wound Dressing one or multiple wounds 0 []  - Large Wound Dressing one or multiple wounds 0 X - Application of Medications - topical 1 5 []  - Application of Medications - injection 0 INTERVENTIONS - Miscellaneous []  - External ear exam 0 []  - Specimen Collection (cultures, biopsies, blood, body fluids, etc.) 0 []  - Specimen(s) / Culture(s) sent or taken to Lab for analysis 0 []  - Patient Transfer (multiple staff / / Similar devices) 0 []  - Simple Staple / Suture removal (25 or less) 0 []  - Complex Staple / Suture removal (26 or more) 0 []  - Hypo / Hyperglycemic Management (close monitor of Blood Glucose) 0 []  - Ankle / Brachial Index (ABI) - do not check if billed separately 0 X - Vital Signs 1 5 Has the patient been seen at the hospital within the last three years: Yes Total Score: 125 Level Of Care: New/Established - Level 4 Electronic Signature(s) Signed: 02/10/2020 6:03:05 PM By: Entered By: on 02/10/2020 13:47:29 -------------------------------------------------------------------------------- Encounter Discharge Information Details Patient Name: Date of Service: [name], [name] 02/10/2020 12:30 PM Medical Record Patient Account Number: Date of Birth/Sex: Treating RN: February 20, 1946 (74 y.o. Primary Care Keiona Jenison: Other Clinician: Referring Brigida Scotti: Treating  Freddie Nghiem/Extender:[name], , 02/12/2020 in Treatment: 28 Encounter Discharge Information Items Discharge Condition: Stable Ambulatory Status: Ambulatory Discharge Destination: Home Transportation: Private Auto Accompanied By: wife Schedule Follow-up Appointment: Yes Clinical Summary of Care: Patient Declined Electronic Signature(s) Signed: 02/10/2020 5:52:37 PM By: 02/12/2020 Entered By: Jeraldine Loots on 02/10/2020 13:28:27 -------------------------------------------------------------------------------- Lower Extremity Assessment Details Patient Name: Date of Service: [name], [name] 02/10/2020 12:30 PM Medical Record 08/30/1946 Patient Account Number: ([id number] Date of Birth/Sex: Treating RN: 1946/08/29 (73 y.o. [name] Primary Care Vester Titsworth: [name] Other Clinician: Referring Leslea Vowles: Treating Livianna Petraglia/Extender:[name], 29, 02/12/2020 in Treatment: 28 Edema Assessment Assessed: [Left: No] [Right: No] Edema: [Left: Ye] [Right: s] Calf Left: Right: Point of Measurement: cm From Medial Instep 39.3 cm cm Ankle Left: Right: Point of Measurement: cm From Medial Instep 29.8 cm cm Vascular Assessment Pulses: Dorsalis Pedis Palpable: [Left:No] Electronic Signature(s) Signed: 02/10/2020 6:08:25 PM By: Cherylin Mylar RN, BSN Entered By: 02/12/2020 on 02/10/2020 12:49:06 -------------------------------------------------------------------------------- Multi-Disciplinary Care Plan Details Patient Name: Date of Service: [name], [name] 02/10/2020 12:30 PM Medical Record [id number] Patient Account Number: 08/30/1946 Date of Birth/Sex: Treating RN: Nov 03, 1945 (73 y.o. [name] Primary Care Abayomi Pattison: [name] Other Clinician: Referring Eddie Payette: Treating Joal Eakle/Extender:[name], [name], 02/12/2020 in Treatment: 28 Active Inactive Wound/Skin Impairment Nursing Diagnoses: Knowledge deficit  related to ulceration/compromised skin integrity Goals: Patient/caregiver will verbalize understanding of skin care regimen Date Initiated: 07/29/2019 Target Resolution Date: 03/26/2020 Goal Status: Active Ulcer/skin breakdown will have a volume reduction of 30% by week 4 Date Inactivated: 10/21/2019 Target Resolution Date Initiated: 07/29/2019 Date: 10/10/2019 Unmet Reason: comorbities. Goal Status: Unmet blood flow Ulcer/skin breakdown will have a volume reduction of 50% by week 8 Date Initiated: 10/21/2019 Date Inactivated: 12/02/2019 Target Resolution Date: 11/21/2019 Goal Status: Unmet Unmet Reason: comorbities Ulcer/skin breakdown will have a volume reduction of 80%  by week 12 Date Initiated: 12/02/2019 Date Inactivated: 01/06/2020 Target Resolution Date: 01/02/2020 Goal Status: Unmet Unmet Reason: comorbities Interventions: Assess patient/caregiver ability to obtain necessary supplies Assess patient/caregiver ability to perform ulcer/skin care regimen upon admission and as needed Assess ulceration(s) every visit Notes: Electronic Signature(s) Signed: 02/10/2020 6:03:05 PM By: Shawn Stall Entered By: Shawn Stall on 02/10/2020 13:03:19 -------------------------------------------------------------------------------- Pain Assessment Details Patient Name: Date of Service: [name], [name] 02/10/2020 12:30 PM Medical Record [id number] Patient Account Number: [id number] Date of Birth/Sex: Treating RN: [birth year] (74 y.o. [name] Primary Care Gyanna Jarema: [name] Other Clinician: Referring Shanaya Schneck: Treating Syliva Mee/Extender:[name], [name], [name] in Treatment: 28 Active Problems Location of Pain Severity and Description of Pain Patient Has Paino No Site Locations Duration of the Pain. Constant / Intermittento Intermittent Rate the pain. Current Pain Level: 0 Worst Pain Level: 6 Least Pain Level: 0 Character of Pain Describe the Pain:  Throbbing Pain Management and Medication Current Pain Management: Other: time Is the Current Pain Management Adequate: Adequate How does your wound impact your activities of daily livingo Sleep: Yes Bathing: No Appetite: No Relationship With Others: No Bladder Continence: No Emotions: No Bowel Continence: No Work: No Toileting: No Drive: No Dressing: No Hobbies: No Electronic Signature(s) Signed: 02/10/2020 6:08:25 PM By: [name] RN, BSN Entered By: [name] on 02/10/2020 12:45:00 -------------------------------------------------------------------------------- Patient/Caregiver Education Details Patient Name: Jeraldine Loots 4/13/2021andnbsp12:30 Date of Service: PM Medical Record [id number] Number: Patient Account Number: [id number] Treating RN: Date of Birth/Gender: 07/12/1946 (74 y.o. [name]) Other Clinician: Primary Care Treating Marlyne Beards Physician: Physician/Extender: Referring Physician: Joaquin Bend in Treatment: 28 Education Assessment Education Provided To: Patient Education Topics Provided Wound/Skin Impairment: Handouts: Skin Care Do's and Dont's Methods: Explain/Verbal Responses: Reinforcements needed Electronic Signature(s) Signed: 02/10/2020 6:03:05 PM By: Shawn Stall Entered By: Shawn Stall on 02/10/2020 13:03:31 -------------------------------------------------------------------------------- Wound Assessment Details Patient Name: Date of Service: [name], [name] 02/10/2020 12:30 PM Medical Record [id number] Patient Account Number: [id number] Date of Birth/Sex: Treating RN: March 30, 1946 (73 y.o. [name] Primary Care Gary Bultman: [name] Other Clinician: Referring Rudi Bunyard: Treating Mia Milan/Extender:[name], [name], [name] in Treatment: 28 Wound Status Wound Number: 5 Primary Diabetic Wound/Ulcer of the Lower Extremity Etiology: Wound Location: Left Metatarsal  head fifth Wound Open Wounding Event: Gradually Appeared Status: Date Acquired: 07/14/2019 Comorbid Cataracts, Chronic sinus Weeks Of Treatment: 28 History: problems/congestion, Coronary Artery Clustered Wound: No Disease, Hypertension, Peripheral Arterial Disease, Type II Diabetes, Osteoarthritis, Neuropathy Wound Measurements Length: (cm) 2 Width: (cm) 1.8 Depth: (cm) 0.3 Area: (cm) 2.827 Volume: (cm) 0.848 % Reduction in Area: -4387.3% % Reduction in Volume: -6423.1% Epithelialization: None Tunneling: No Undermining: Yes Starting Position (o'clock): 12 Ending Position (o'clock): 12 Maximum Distance: (cm) 0.2 Wound Description Classification: Grade 2 Wound Margin: Well defined, not attached Exudate Amount: Medium Exudate Type: Serosanguineous Exudate Color: red, brown Wound Bed Granulation Amount: None Present (0%) Necrotic Amount: Large (67-100%) Necrotic Quality: Adherent Slough Foul Odor After Cleansing: Yes Due to Product Use: No Slough/Fibrino Yes Exposed Structure Fascia Exposed: No Fat Layer (Subcutaneous Tissue) Exposed: Yes Tendon Exposed: No Muscle Exposed: No Joint Exposed: No Bone Exposed: No Treatment Notes Wound #5 (Left Metatarsal head fifth) 1. Cleanse With Wound Cleanser 2. Periwound Care Skin Prep 3. Primary Dressing Applied Calcium Alginate Ag Other primary dressing (specifiy in notes) 4. Secondary Dressing Foam Border Dressing Notes mupirocin Electronic Signature(s) Signed: 02/10/2020 6:08:25 PM By: [name] RN, BSN Entered By: [name] on 02/10/2020 12:53:37 -------------------------------------------------------------------------------- Vitals Details  Patient Name: Date of Service: [name], [name] 02/10/2020 12:30 PM Medical Record [id number] Patient Account Number: [id number] Date of Birth/Sex: Treating RN: 1945/12/23 (74 y.o. [name] Primary Care Taran Hable: [name] Other  Clinician: Referring Shonda Mandarino: Treating Rufus Beske/Extender:[name], [name], [name] in Treatment: 28 Vital Signs Time Taken: 12:42 Temperature (F): 98.2 Height (in): 69 Pulse (bpm): 76 Source: Stated Respiratory Rate (breaths/min): 18 Weight (lbs): 210 Blood Pressure (mmHg): 127/78 Source: Stated Capillary Blood Glucose (mg/dl): 78 Body Mass Index (BMI): 31 Reference Range: 80 - 120 mg / dl Notes glucose per pt report this am Electronic Signature(s) Signed: 02/10/2020 6:08:25 PM By: [name] RN, BSN Entered By: [name] on 02/10/2020 12:44:34

## 2020-02-11 NOTE — Progress Notes (Signed)
[name], [name] ([id number]) Visit Report for 12/30/2019 Arrival Information Details Patient Name: Date of Service: [name], [name] 12/30/2019 3:00 PM Medical Record [id number] Patient Account Number: [id number] Date of Birth/Sex: Treating RN: 1946-08-08 (73 y.o. [name] [name] Primary Care Makalynn Berwanger: [name] Other Clinician: Referring Dashley Monts: Treating Deaysia Grigoryan/Extender:[name], [name], [name] in Treatment: 22 Visit Information History Since Last Visit Added or deleted any medications: No Patient Arrived: Ambulatory Any new allergies or adverse reactions: No Arrival Time: 15:11 Had a fall or experienced change in No Accompanied By: wife activities of daily living that may affect Transfer Assistance: None risk of falls: Patient Identification Verified: Yes Signs or symptoms of abuse/neglect since last No Secondary Verification Process Completed: Yes visito Patient Requires Transmission-Based No Hospitalized since last visit: No Precautions: Implantable device outside of the clinic excluding No Patient Has Alerts: No cellular tissue based products placed in the center since last visit: Has Dressing in Place as Prescribed: Yes Pain Present Now: Yes Electronic Signature(s) Signed: 02/11/2020 9:21:08 AM By: Karl Ito Entered By: Karl Ito on 12/30/2019 15:11:48 -------------------------------------------------------------------------------- Encounter Discharge Information Details Patient Name: Date of Service: [name], [name] 12/30/2019 3:00 PM Medical Record [id number] Patient Account Number: [id number] Date of Birth/Sex: Treating RN: [birth year] (74 y.o. [name] Primary Care Elyce Zollinger: [name] Other Clinician: Referring Aysen Shieh: Treating Derward Marple/Extender:[name], [name], [name] in Treatment: 22 Encounter Discharge Information Items Post Procedure Vitals Discharge Condition: Stable Temperature (F):  99.1 Ambulatory Status: Ambulatory Pulse (bpm): 84 Discharge Destination: Home Respiratory Rate (breaths/min): 18 Transportation: Private Auto Blood Pressure (mmHg): 160/70 Accompanied By: wife Schedule Follow-up Appointment: Yes Clinical Summary of Care: Patient Declined Electronic Signature(s) Signed: 12/30/2019 5:07:00 PM By: Cherylin Mylar Entered By: Cherylin Mylar on 12/30/2019 16:03:52 -------------------------------------------------------------------------------- Lower Extremity Assessment Details Patient Name: Date of Service: [name], [name] 12/30/2019 3:00 PM Medical Record [id number] Patient Account Number: [id number] Date of Birth/Sex: Treating RN: [birth year] (73 y.o. [name] Primary Care Briggs Edelen: [name] Other Clinician: Referring Dontrel Smethers: Treating Jamilet Ambroise/Extender:[name], [name], [name] in Treatment: 22 Edema Assessment Assessed: [Left: No] [Right: No] Edema: [Left: Ye] [Right: s] Calf Left: Right: Point of Measurement: cm From Medial Instep 42 cm cm Ankle Left: Right: Point of Measurement: cm From Medial Instep 30 cm cm Vascular Assessment Pulses: Dorsalis Pedis Palpable: [Left:Yes] Electronic Signature(s) Signed: 12/30/2019 4:52:50 PM By: [name] RN, BSN Entered By: [name] on 12/30/2019 15:21:04 -------------------------------------------------------------------------------- Multi Wound Chart Details Patient Name: Date of Service: [name], [name] 12/30/2019 3:00 PM Medical Record [id number] Patient Account Number: [id number] Date of Birth/Sex: Treating RN: 12/18/1945 (73 y.o. 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] Comorbid History: [5:Cataracts, Chronic sinus N/A problems/congestion, Coronary Artery Disease, Hypertension, Peripheral Arterial Disease, Type II Diabetes, Osteoarthritis, Neuropathy] Date Acquired: [5:07/14/2019] [N/A:N/A] Weeks of Treatment: [5:22] [N/A:N/A] Wound Status: [5:Open] [N/A:N/A] Measurements L x W x D 1.7x1.4x0.4 [N/A:N/A] (cm) Area (cm) : [5:1.869] [N/A:N/A] Volume (cm) : [5:0.748] [N/A:N/A] % Reduction in Area: [5:-2866.70%] [N/A:N/A] % Reduction in Volume: -5653.80% [N/A:N/A] Classification: [5:Grade 1] [N/A:N/A] Exudate Amount: [5:Small] [N/A:N/A] Exudate Type: [5:Serosanguineous] [N/A:N/A] Exudate Color: [5:red, brown] [N/A:N/A] Wound Margin: [5:Distinct, outline attached N/A] Granulation Amount: [  5:Small (1-33%)] [N/A:N/A] Granulation Quality: [5:Red] [N/A:N/A] Necrotic Amount: [5:Large (67-100%)] [N/A:N/A] Exposed Structures: [5:Fat Layer (Subcutaneous N/A Tissue) Exposed: Yes Fascia: No Tendon: No Muscle: No Joint: No Bone: No] Epithelialization: [5:None] [N/A:N/A] Debridement: [5:Debridement - Excisional N/A] Pre-procedure [5:15:42] [N/A:N/A] Verification/Time Out Taken: Pain Control: [5:Other] [N/A:N/A] Tissue Debrided: [5:Subcutaneous, Slough] [N/A:N/A] Level: [5:Skin/Subcutaneous Tissue N/A] Debridement Area (sq cm):2.38 [N/A:N/A] Instrument: [5:Curette] [N/A:N/A] Bleeding: [5:Moderate] [N/A:N/A] Hemostasis Achieved: [5:Pressure] [N/A:N/A] Procedural Pain: [5:0] [N/A:N/A] Post Procedural Pain: [5:0] [N/A:N/A] Debridement Treatment [5:Procedure was tolerated] [N/A:N/A] Response: [5:well] Post Debridement [5:1.7x1.4x0.4] [N/A:N/A] Measurements L x W x D (cm) Post Debridement  [5:0.748] [N/A:N/A] Volume: (cm) Procedures Performed: [5:Debridement] [N/A:N/A] Treatment Notes Wound #5 (Left Metatarsal head fifth) 1. Cleanse With Wound Cleanser 3. Primary Dressing Applied Collegen AG Hydrogel or K-Y Jelly 4. Secondary Dressing Dry Gauze Foam 5. Secured With Tape 7. Footwear/Offloading device applied Total Contact Cast Electronic Signature(s) Signed: 12/30/2019 5:14:23 PM By: Baltazar Najjar MD Signed: 12/31/2019 5:16:33 PM By: [name] RN Entered By: Baltazar Najjar on 12/30/2019 16:21:24 -------------------------------------------------------------------------------- Multi-Disciplinary Care Plan Details Patient Name: Date of Service: [name], [name] 12/30/2019 3:00 PM Medical Record [id number] Patient Account Number: [id number] Date of Birth/Sex: Treating RN: February 03, 1946 (73 y.o. [name] Primary Care Shekia Kuper: [name] Other Clinician: Referring Maxwel Meadowcroft: Treating Mylinh Cragg/Extender:[name], [name], [name] in Treatment: 22 Active Inactive Wound/Skin Impairment Nursing Diagnoses: Knowledge deficit related to ulceration/compromised skin integrity Goals: Patient/caregiver will verbalize understanding of skin care regimen Date Initiated: 07/29/2019 Target Resolution Date: 01/02/2020 Goal Status: Active Ulcer/skin breakdown will have a volume reduction of 30% by week 4 Target Resolution Date Initiated: 07/29/2019 Date Inactivated: 10/21/2019 Date: 10/10/2019 Unmet Reason: comorbities. Goal Status: Unmet blood flow Ulcer/skin breakdown will have a volume reduction of 50% by week 8 Date Initiated: 10/21/2019 Date Inactivated: 12/02/2019 Target Resolution Date: 11/21/2019 Goal Status: Unmet Unmet Reason: comorbities Ulcer/skin breakdown will have a volume reduction of 80% by week 12 Date Initiated: 12/02/2019 Target Resolution Date: 01/02/2020 Goal Status: Active Interventions: Assess patient/caregiver ability to obtain  necessary supplies Assess patient/caregiver ability to perform ulcer/skin care regimen upon admission and as needed Assess ulceration(s) every visit Notes: Electronic Signature(s) Signed: 12/31/2019 5:16:33 PM By: [name] RN Entered By: [name] on 12/30/2019 15:11:56 -------------------------------------------------------------------------------- Pain Assessment Details Patient Name: Date of Service: [name], [name] 12/30/2019 3:00 PM Medical Record [id number] Patient Account Number: [id number] Date of Birth/Sex: Treating RN: Aug 19, 1946 (73 y.o. [name] [name] Primary Care Mcarthur Ivins: [name] Other Clinician: Referring Corbin Falck: Treating Hosanna Betley/Extender:[name], [name], [name] in Treatment: 22 Active Problems Location of Pain Severity and Description of Pain Patient Has Paino No Site Locations Pain Management and Medication Current Pain Management: Electronic Signature(s) Signed: 12/31/2019 5:16:33 PM By: [name] RN Signed: 02/11/2020 9:21:08 AM By: Karl Ito Entered By: Karl Ito on 12/30/2019 15:13:29 -------------------------------------------------------------------------------- Patient/Caregiver Education Details Patient Name: Date of Service: [name], [name] 3/2/2021andnbsp3:00 PM Medical Record [id number] Patient Account Number: [id number] Date of Birth/Gender: 07-01-1946 (74 y.o. M) Treating RN: [name] Primary Care Physician: [name] Other Clinician: Referring Physician: Treating Physician/Extender:[name], [name], [name] in Treatment: 22 Education Assessment Education Provided To: Patient Education Topics Provided Wound/Skin Impairment: Methods: Explain/Verbal Responses: State content correctly [employer]) Signed: 12/31/2019 5:16:33 PM By: [name] RN Entered By: [name] on 12/30/2019  15:12:12 -------------------------------------------------------------------------------- Wound Assessment Details Patient Name: Date of Service: [name], [name] 12/30/2019 3:00 PM Medical Record [id number] Patient Account Number: [id number] Date of Birth/Sex: Treating RN: [birth year] (73 y.o. [name] [name] Primary Care Romin Divita:  [name] Other Clinician: Referring Annemarie Sebree: Treating Adamari Frede/Extender:[name], [name], [name] in Treatment: 22 Wound Status Wound Number: 5 Primary Diabetic Wound/Ulcer of the Lower Extremity Etiology: Wound Location: Left Metatarsal head fifth Wound Open Wounding Event: Gradually Appeared Status: Date Acquired: 07/14/2019 Comorbid Cataracts, Chronic sinus problems/congestion, Weeks Of Treatment: 22 History: Coronary Artery Disease, Hypertension, Clustered Wound: No Peripheral Arterial Disease, Type II Diabetes, Osteoarthritis, Neuropathy Photos Wound Measurements Length: (cm) 1.7 % Reduction i Width: (cm) 1.4 % Reduction i Depth: (cm) 0.4 Epithelializa Area: (cm) 1.869 Tunneling: Volume: (cm) 0.748 Undermining: Wound Description Classification: Grade 1 Foul Odor Aft Wound Margin: Distinct, outline attached Slough/Fibrin Exudate Amount: Small Exudate Type: Serosanguineous Exudate Color: red, brown Wound Bed Granulation Amount: Small (1-33%) Granulation Quality: Red Fascia Expose Necrotic Amount: Large (67-100%) Fat Layer (Su Necrotic Quality: Adherent Slough Tendon Expose Muscle Expose Joint Exposed Bone Exposed: Electronic Signature(s) Signed: 01/01/2020 3:59:23 PM By: Mikeal Hawthorne EMT/HBOT Signed: 01/01/2020 5:55:28 PM By: [name] RN Previous Signature: 12/30/2019 4:52:50 PM Version By: Lourdes Sledge Entered By: Mikeal Hawthorne on 03/04 er Cleansing: No o Yes Exposed Structure d: No bcutaneous Tissue) Exposed: Yes d: No d: No : No No da RN, BSN /2021 14:29:13 n Area: -2866.7% n Volume:  -5653.8% tion: None No No -------------------------------------------------------------------------------- Vitals Details Patient Name: Date of Service: [name], [name] 12/30/2019 3:00 PM Medical Record [id number] Patient Account Number: [id number] Date of Birth/Sex: Treating RN: 09-16-1946 (73 y.o. [name]) [name] Primary Care Elery Cadenhead: [name] Other Clinician: Referring Tannisha Kennington: Treating Deloris Mittag/Extender:[name], [name], [name] in Treatment: 22 Vital Signs Time Taken: 15:11 Temperature (F): 99.1 Height (in): 69 Pulse (bpm): 84 Weight (lbs): 210 Respiratory Rate (breaths/min): 18 Body Mass Index (BMI): 31 Blood Pressure (mmHg): 160/70 Capillary Blood Glucose (mg/dl): 163 Reference Range: 80 - 120 mg / dl Electronic Signature(s) Signed: 02/11/2020 9:21:08 AM By: Sandre Kitty Entered By: Sandre Kitty on 12/30/2019 15:13:18

## 2020-02-11 NOTE — Progress Notes (Signed)
Todd, Guzman ([id number]) Visit Report for 12/16/2019 Arrival Information Details Patient Name: Date of Service: Todd, Guzman 12/16/2019 12:30 PM Medical Record [id number] Patient Account Number: [id number] Date of Birth/Sex: Treating RN: 04/01/1946 (73 y.o. [name] [name] Primary Care Edras Wilford: [name] Other Clinician: Referring Keanna Tugwell: Treating Sylvanna Burggraf/Extender:[name], [name], [name] in Treatment: 20 Visit Information History Since Last Visit Added or deleted any medications: No Patient Arrived: Ambulatory Any new allergies or adverse reactions: No Arrival Time: 12:41 Had a fall or experienced change in No Accompanied By: wife activities of daily living that may affect Transfer Assistance: None risk of falls: Patient Identification Verified: Yes Signs or symptoms of abuse/neglect since last No Secondary Verification Process Completed: Yes visito Patient Requires Transmission-Based No Hospitalized since last visit: No Precautions: Implantable device outside of the clinic excluding No Patient Has Alerts: No cellular tissue based products placed in the center since last visit: Has Dressing in Place as Prescribed: Yes Pain Present Now: No Electronic Signature(s) Signed: 02/11/2020 9:23:26 AM By: Karl Ito Entered By: Karl Ito on 12/16/2019 12:42:19 -------------------------------------------------------------------------------- Encounter Discharge Information Details Patient Name: Date of Service: Todd, Guzman 12/16/2019 12:30 PM Medical Record [id number] Patient Account Number: [id number] Date of Birth/Sex: Treating RN: 08/26/1946 (74 y.o. [name] Primary Care Savier Trickett: [name] Other Clinician: Referring Corean Yoshimura: Treating Eh Sesay/Extender:[name], [name], [name] in Treatment: 20 Encounter Discharge Information Items Post Procedure Vitals Discharge Condition: Stable Temperature (F):  98.5 Ambulatory Status: Ambulatory Pulse (bpm): 76 Discharge Destination: Home Respiratory Rate (breaths/min): 18 Transportation: Private Auto Blood Pressure (mmHg): 102/50 Accompanied By: self Schedule Follow-up Appointment: Yes Clinical Summary of Care: Patient Declined Electronic Signature(s) Signed: 12/16/2019 5:22:24 PM By: Cherylin Mylar Entered By: Cherylin Mylar on 12/16/2019 13:34:13 -------------------------------------------------------------------------------- Lower Extremity Assessment Details Patient Name: Date of Service: Todd, Guzman 12/16/2019 12:30 PM Medical Record [id number] Patient Account Number: [id number] Date of Birth/Sex: Treating RN: Apr 26, 1946 (73 y.o. [name] Primary Care Gracie Gupta: [name] Other Clinician: Referring Ah Bott: Treating Torell Minder/Extender:[name], [name], [name] in Treatment: 20 Edema Assessment Assessed: [Left: No] [Right: No] Edema: [Left: Ye] [Right: s] Calf Left: Right: Point of Measurement: cm From Medial Instep 39.6 cm cm Ankle Left: Right: Point of Measurement: cm From Medial Instep 29 cm cm Vascular Assessment Pulses: Dorsalis Pedis Palpable: [Left:Yes] Electronic Signature(s) Signed: 12/16/2019 5:22:24 PM By: Cherylin Mylar Entered By: Cherylin Mylar on 12/16/2019 13:01:37 -------------------------------------------------------------------------------- Multi Wound Chart Details Patient Name: Date of Service: Todd, Guzman 12/16/2019 12:30 PM Medical Record [id number] Patient Account Number: [id number] Date of Birth/Sex: Treating RN: Jun 07, 1946 (73 y.o. 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] Comorbid History: [5:Cataracts, Chronic sinus N/A problems/congestion, Coronary Artery Disease, Hypertension, Peripheral Arterial Disease, Type II Diabetes, Osteoarthritis, Neuropathy] Date Acquired: [5:07/14/2019] [N/A:N/A] Weeks of Treatment: [5:20] [N/A:N/A] Wound Status: [5:Open] [N/A:N/A] Measurements L x W x D 0.5x0.6x0.2 [N/A:N/A] (cm) Area (cm) : [5:0.236] [N/A:N/A] Volume (cm) : [5:0.047] [N/A:N/A] % Reduction in Area: [5:-274.60%] [N/A:N/A] % Reduction in Volume: -261.50% [N/A:N/A] Starting Position 1 7 (o'clock): Ending Position 1 [5:10] (o'clock): Maximum Distance 1 [5:0.6] (cm): Undermining: [5:Yes] [N/A:N/A] Classification: [5:Grade 1] [N/A:N/A] Exudate Amount: [5:Small] [N/A:N/A] Exudate  Type: [5:Serosanguineous] [N/A:N/A] Exudate Color: [5:red, brown] [N/A:N/A] Wound Margin: [5:Well defined, not attached N/A] Granulation Amount: [5:Medium (34-66%)] [N/A:N/A] Granulation Quality: [5:Pink, Pale, Friable] [N/A:N/A] Necrotic Amount: [5:Medium (34-66%)] [N/A:N/A] Exposed Structures: [5:Fat Layer (Subcutaneous N/A Tissue) Exposed: Yes Fascia: No Tendon: No Muscle: No Joint: No Bone: No] Epithelialization: [5:None] [N/A:N/A] Debridement: [5:Debridement - Excisional N/A] Pre-procedure [5:13:14] [N/A:N/A] Verification/Time Out Taken: Pain Control: [5:Other] [N/A:N/A] Tissue Debrided: [5:Subcutaneous, Slough] [N/A:N/A] Level: [5:Skin/Subcutaneous Tissue] [N/A:N/A] Debridement Area (sq cm):0.3 [N/A:N/A] Instrument: [5:Curette] [N/A:N/A] Bleeding: [5:Moderate] [N/A:N/A] Hemostasis Achieved: [5:Pressure] [N/A:N/A] Procedural Pain: [5:0] [N/A:N/A] Post Procedural Pain: [5:0]  [N/A:N/A] Debridement Treatment Procedure was tolerated [N/A:N/A] Response: [5:well] Post Debridement [5:0.5x0.6x0.2] [N/A:N/A] Measurements L x W x D (cm) Post Debridement [5:0.047] [N/A:N/A] Volume: (cm) Procedures Performed: Debridement [N/A:N/A] Treatment Notes Wound #5 (Left Metatarsal head fifth) 1. Cleanse With Wound Cleanser 2. Periwound Care Skin Prep 3. Primary Dressing Applied Polymem Ag 4. Secondary Dressing Dry Gauze 5. Secured With Tape Notes front offloader. added foam for cushion to great toe Electronic Signature(s) Signed: 12/16/2019 5:18:57 PM By: Baltazar Najjar MD Signed: 12/17/2019 5:45:19 PM By: [name] RN Entered By: Baltazar Najjar on 12/16/2019 13:37:30 -------------------------------------------------------------------------------- Multi-Disciplinary Care Plan Details Patient Name: Date of Service: Todd, Guzman 12/16/2019 12:30 PM Medical Record [id number] Patient Account Number: [id number] Date of Birth/Sex: Treating RN: 02/23/1946 (73 y.o. [name] Primary Care Jaylianna Tatlock: [name] Other Clinician: Referring Aitanna Haubner: Treating Katlynne Mckercher/Extender:[name], [name], [name] in Treatment: 20 Active Inactive Wound/Skin Impairment Nursing Diagnoses: Knowledge deficit related to ulceration/compromised skin integrity Goals: Patient/caregiver will verbalize understanding of skin care regimen Date Initiated: 07/29/2019 Target Resolution Date: 01/02/2020 Goal Status: Active Ulcer/skin breakdown will have a volume reduction of 30% by week 4 Target Resolution Date Initiated: 07/29/2019 Date Inactivated: 10/21/2019 Date: 10/10/2019 Unmet Reason: comorbities. Goal Status: Unmet blood flow Ulcer/skin breakdown will have a volume reduction of 50% by week 8 Date Initiated: 10/21/2019 Date Inactivated: 12/02/2019 Target Resolution Date: 11/21/2019 Goal Status: Unmet Unmet Reason: comorbities Ulcer/skin breakdown will have a  volume reduction of 80% by week 12 Date Initiated: 12/02/2019 Target Resolution Date: 01/02/2020 Goal Status: Active Interventions: Assess patient/caregiver ability to obtain necessary supplies Assess patient/caregiver ability to perform ulcer/skin care regimen upon admission and as needed Assess ulceration(s) every visit Notes: Electronic Signature(s) Signed: 12/17/2019 5:45:19 PM By: [name] RN Entered By: [name] on 12/16/2019 13:13:19 -------------------------------------------------------------------------------- Pain Assessment Details Patient Name: Date of Service: Todd, Guzman 12/16/2019 12:30 PM Medical Record [id number] Patient Account Number: [id number] Date of Birth/Sex: Treating RN: 1946-02-22 (73 y.o. [name] [name] Primary Care Zahir Eisenhour: [name] Other Clinician: Referring Yaileen Hofferber: Treating Jolene Guyett/Extender:[name], [name], [name] in Treatment: 20 Active Problems Location of Pain Severity and Description of Pain Patient Has Paino No Site Locations Pain Management and Medication Current Pain Management: Electronic Signature(s) Signed: 12/17/2019 5:45:19 PM By: [name] RN Signed: 02/11/2020 9:23:26 AM By: Karl Ito Entered By: Karl Ito on 12/16/2019 12:42:27 -------------------------------------------------------------------------------- Patient/Caregiver Education Details Patient Name: Date of Service: [name] 2/16/2021andnbsp12:30 PM Medical Record Patient Account Number: [id number] Number: Treating RN: [name] Apr 08, 1946 (73 y.o. Other Clinician: Date of Birth/Gender: M) Treating Baltazar Najjar Primary Care Physician: [name] Physician/Extender: Referring Physician: Joaquin Bend in Treatment: 20 Education Assessment Education Provided To: Patient Education Topics Provided Wound/Skin Impairment: Methods: Explain/Verbal Responses: State content  correctly Electronic Signature(s) Signed: 12/17/2019 5:45:19 PM By: [name] RN Entered By: [name] on 12/16/2019 13:13:35 -------------------------------------------------------------------------------- Wound Assessment Details Patient Name: Date of Service: [name]  12/16/2019 12:30 PM Medical Record [id number] Patient Account Number: [id number] Date of Birth/Sex: Treating RN: [birth year] (73 y.o. [name]) [name] Primary Care Latiqua Daloia: [name] Other Clinician: Referring Nyomi Howser: Treating Nashton Belson/Extender:[name], [name], [name] in Treatment: 20 Wound Status Wound Number: 5 Primary Diabetic Wound/Ulcer of the Lower Extremity Etiology: Wound Location: Left Metatarsal head fifth Wound Open Wounding Event: Gradually Appeared Status: Date Acquired: 07/14/2019 Comorbid Cataracts, Chronic sinus problems/congestion, Weeks Of Treatment: 20 History: Coronary Artery Disease, Hypertension, Clustered Wound: No Peripheral Arterial Disease, Type II Diabetes, Osteoarthritis, Neuropathy Photos Wound Measurements Length: (cm) 0.5 % Reduction in Width: (cm) 0.6 % Reduction in Depth: (cm) 0.2 Epithelializat Area: (cm) 0.236 Tunneling: Volume: (cm) 0.047 Undermining: Starting Po Ending Posi Maximum Dis Area: -274.6% Volume: -261.5% ion: None No Yes sition (o'clock): 7 tion (o'clock): 10 tance: (cm) 0.6 Wound Description Classification: Grade 1 Foul Odor Aft Wound Margin: Well defined, not attached Slough/Fibrin Exudate Amount: Small Exudate Type: Serosanguineous Exudate Color: red, brown Wound Bed Granulation Amount: Medium (34-66%) Granulation Quality: Pink, Pale, Friable Fascia Exposed Necrotic Amount: Medium (34-66%) Fat Layer (Sub Necrotic Quality: Adherent Slough Tendon Exposed Muscle Exposed Joint Exposed: Bone Exposed: Electronic Signature(s) Signed: 12/17/2019 4:27:12 PM By: Mikeal Hawthorne EMT/HBOT Signed: 12/17/2019 5:45:19  PM By: [name] RN Previous Signature: 12/16/2019 5:22:24 PM Version By: Quinn Axe Entered By: Mikeal Hawthorne on 02/17/ er Cleansing: No o Yes Exposed Structure : No cutaneous Tissue) Exposed: Yes : No : No No No annon 2021 15:22:04 -------------------------------------------------------------------------------- Vitals Details Patient Name: Date of Service: Todd, Guzman 12/16/2019 12:30 PM Medical Record [id number] Patient Account Number: [id number] Date of Birth/Sex: Treating RN: Mar 17, 1946 (73 y.o. [name]) [name] Primary Care Runell Kovich: [name] Other Clinician: Referring Sage Hammill: Treating Andrzej Scully/Extender:[name], [name], [name] in Treatment: 20 Vital Signs Time Taken: 12:42 Temperature (F): 98.5 Height (in): 69 Pulse (bpm): 76 Weight (lbs): 210 Respiratory Rate (breaths/min): 18 Body Mass Index (BMI): 31 Blood Pressure (mmHg): 102/50 Capillary Blood Glucose (mg/dl): 106 Reference Range: 80 - 120 mg / dl Electronic Signature(s) Signed: 02/11/2020 9:23:26 AM By: Sandre Kitty Entered By: Sandre Kitty on 12/16/2019 12:44:06

## 2020-02-17 ENCOUNTER — Encounter (HOSPITAL_BASED_OUTPATIENT_CLINIC_OR_DEPARTMENT_OTHER): Payer: Medicare Other | Admitting: Internal Medicine

## 2020-02-17 ENCOUNTER — Other Ambulatory Visit: Payer: Self-pay

## 2020-02-17 DIAGNOSIS — E1151 Type 2 diabetes mellitus with diabetic peripheral angiopathy without gangrene: Secondary | ICD-10-CM | POA: Diagnosis not present

## 2020-02-17 NOTE — Progress Notes (Signed)
[name] ([id number]) Visit Report for 02/17/2020 Arrival Information Details Patient Name: Date of Service: [name], [name] 02/17/2020 12:30 PM Medical Record [id number] Patient Account Number: [id number] Date of Birth/Sex: Treating RN: 20-Feb-1946 (74 y.o. [name] Primary Care [name]: [name] Other Clinician: Referring [name]: Treating [name]/Extender:[name] in Treatment: 29 Visit Information History Since Last Visit Added or deleted any medications: No Patient Arrived: Ambulatory Any new allergies or adverse reactions: No Arrival Time: 12:58 Had a fall or experienced change in No Accompanied By: wife activities of daily living that may affect Transfer Assistance: None risk of falls: Patient Identification Verified: Yes Signs or symptoms of abuse/neglect since No Secondary Verification Process Yes last visito Completed: Hospitalized since last visit: No Patient Requires Transmission-Based No Implantable device outside of the clinic No Precautions: excluding Patient Has Alerts: No cellular tissue based products placed in the center since last visit: Has Dressing in Place as Prescribed: Yes Has Footwear/Offloading in Place as Yes Prescribed: Left: Wedge Shoe Pain Present Now: No Electronic Signature(s) Signed: 02/17/2020 6:13:01 PM By: Todd Hurst RN, BSN Entered By: [name] on 02/17/2020 12:58:43 -------------------------------------------------------------------------------- Encounter Discharge Information Details Patient Name: Date of Service: [name], [name] 02/17/2020 12:30 PM Medical Record [id number] Patient Account Number: [id number] Date of Birth/Sex: Treating RN: August 26, 1946 (74 y.o. [name] Primary Care [name]: [name] Other Clinician: Referring [name]: Treating [name]/Extender:[name] in Treatment: 29 Encounter Discharge  Information Items Post Procedure Vitals Discharge Condition: Stable Temperature (F): 98.4 Ambulatory Status: Ambulatory Pulse (bpm): 75 Discharge Destination: Home Respiratory Rate (breaths/min): 18 Transportation: Private Auto Blood Pressure (mmHg): 116/63 Accompanied By: self Schedule Follow-up Appointment: Yes Clinical Summary of Care: Patient Declined Electronic Signature(s) Signed: 02/17/2020 5:57:12 PM By: [name] Entered By: [name] on 02/17/2020 14:04:00 -------------------------------------------------------------------------------- Lower Extremity Assessment Details Patient Name: Date of Service: [name], [name] 02/17/2020 12:30 PM Medical Record [id number] Patient Account Number: [id number] Date of Birth/Sex: Treating RN: 1946-06-22 (74 y.o. [name] Primary Care [name]: [name] Other Clinician: Referring [name]: Treating [name]/Extender:[name] in Treatment: 29 Edema Assessment Assessed: [Left: No] [Right: No] Edema: [Left: Ye] [Right: s] Calf Left: Right: Point of Measurement: cm From Medial Instep 39.3 cm cm Ankle Left: Right: Point of Measurement: cm From Medial Instep 29.8 cm cm Vascular Assessment Pulses: Dorsalis Pedis Palpable: [Left:No] Electronic Signature(s) Signed: 02/17/2020 6:13:01 PM By: Todd Hurst RN, BSN Entered By: [name] on 02/17/2020 13:04:20 -------------------------------------------------------------------------------- Multi Wound Chart Details Patient Name: Date of Service: [name], [name] 02/17/2020 12:30 PM Medical Record [id number] Patient Account Number: [id number] Date of Birth/Sex: Treating RN: Mar 19, 1946 (74 y.o. 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] Comorbid History: [5:Cataracts, Chronic sinus N/A problems/congestion, Coronary Artery Disease, Hypertension, Peripheral Arterial Disease, Type II Diabetes, Osteoarthritis, Neuropathy] Date Acquired: [5:07/14/2019] [N/A:N/A] Weeks of Treatment: [5:29] [N/A:N/A] Wound Status: [5:Open] [N/A:N/A] Measurements L x W x D 2x1.7x0.4 [N/A:N/A] (cm) Area (cm) : [5:2.67] [N/A:N/A] Volume (cm) : [5:1.068] [N/A:N/A] % Reduction in Area: [5:-4138.10%] [N/A:N/A] % Reduction in Volume: -8115.40% [N/A:N/A] Starting Position 1 1 (o'clock): Ending Position 1 [5:4] (o'clock): Maximum Distance 1 [  5:0.4] (cm): Undermining: [5:Yes] [N/A:N/A] Classification: [5:Grade 2] [N/A:N/A] Exudate Amount: [5:Medium] [N/A:N/A] Exudate Type: [5:Serosanguineous] [N/A:N/A] Exudate Color: [5:red, brown] [N/A:N/A] Wound Margin: [5:Well defined, not attached N/A] Granulation Amount: [5:Small (1-33%)] [N/A:N/A] Granulation Quality: [5:Pink, Pale] [N/A:N/A] Necrotic Amount: [5:Large (67-100%)] [N/A:N/A] Necrotic Tissue: [5:Eschar, Adherent Slough] [N/A:N/A] Exposed Structures: [5:Fat Layer (Subcutaneous Tissue) Exposed: Yes Fascia: No Tendon: No Muscle: No Joint: No Bone: No] [N/A:N/A] Epithelialization: [5:None] [N/A:N/A] Debridement: [5:Debridement - Excisional] [N/A:N/A] Pre-procedure [5:13:25] [N/A:N/A] Verification/Time Out Taken: Pain Control: [5:Lidocaine 5% topical ointment] [N/A:N/A] Tissue Debrided: [5:Necrotic/Eschar, Subcutaneous, Slough] [N/A:N/A] Level: [5:Skin/Subcutaneous Tissue] [N/A:N/A] Debridement Area (sq cm):3.4  [N/A:N/A] Instrument: [5:Curette] [N/A:N/A] Bleeding: [5:Moderate] [N/A:N/A] Hemostasis Achieved: [5:Pressure] [N/A:N/A] Procedural Pain: [5:0] [N/A:N/A] Post Procedural Pain: [5:0] [N/A:N/A] Debridement Treatment Procedure was tolerated [N/A:N/A] Response: [5:well] Post Debridement [5:2x1.7x0.4] [N/A:N/A] Measurements L x W x D (cm) Post Debridement [5:1.068] [N/A:N/A] Volume: (cm) Procedures Performed: Debridement [N/A:N/A] Treatment Notes Electronic Signature(s) Signed: 02/17/2020 5:39:56 PM By: [name] RN Signed: 02/17/2020 5:47:38 PM By: Baltazar Najjar MD Entered By: Baltazar Najjar on 02/17/2020 13:41:38 -------------------------------------------------------------------------------- Multi-Disciplinary Care Plan Details Patient Name: Date of Service: [name], [name] 02/17/2020 12:30 PM Medical Record [id number] Patient Account Number: [id number] Date of Birth/Sex: Treating RN: 30-Sep-1946 (73 y.o. [name] Primary Care Mehki Klumpp: [name] Other Clinician: Referring Shanteria Laye: Treating Alee Katen/Extender:[name], [name], [name] in Treatment: 29 Active Inactive Wound/Skin Impairment Nursing Diagnoses: Knowledge deficit related to ulceration/compromised skin integrity Goals: Patient/caregiver will verbalize understanding of skin care regimen Date Initiated: 07/29/2019 Target Resolution Date: 03/26/2020 Goal Status: Active Ulcer/skin breakdown will have a volume reduction of 30% by week 4 Date Inactivated: 10/21/2019 Target Resolution Date Initiated: 07/29/2019 Date: 10/10/2019 Unmet Reason: comorbities. Goal Status: Unmet blood flow Ulcer/skin breakdown will have a volume reduction of 50% by week 8 Date Initiated: 10/21/2019 Date Inactivated: 12/02/2019 Target Resolution Date: 11/21/2019 Goal Status: Unmet Unmet Reason: comorbities Ulcer/skin breakdown will have a volume reduction of 80% by week 12 Date Initiated: 12/02/2019 Date  Inactivated: 01/06/2020 Target Resolution Date: 01/02/2020 Goal Status: Unmet Unmet Reason: comorbities Interventions: Assess patient/caregiver ability to obtain necessary supplies Assess patient/caregiver ability to perform ulcer/skin care regimen upon admission and as needed Assess ulceration(s) every visit Notes: Electronic Signature(s) Signed: 02/17/2020 5:39:56 PM By: [name] RN Entered By: [name] on 02/17/2020 13:19:02 -------------------------------------------------------------------------------- Pain Assessment Details Patient Name: Date of Service: [name], [name] 02/17/2020 12:30 PM Medical Record [id number] Patient Account Number: [id number] Date of Birth/Sex: Treating RN: 1946/07/11 (74 y.o. [name] Primary Care Yaneliz Radebaugh: [name] Other Clinician: Referring Sohana Austell: Treating Charles Andringa/Extender:[name], [name], [name] in Treatment: 29 Active Problems Location of Pain Severity and Description of Pain Patient Has Paino No Site Locations Pain Management and Medication Current Pain Management: Electronic Signature(s) Signed: 02/17/2020 6:13:01 PM By: Zandra Abts RN, BSN Entered By: Zandra Abts on 02/17/2020 12:59:51 -------------------------------------------------------------------------------- Patient/Caregiver Education Details Patient Name: Date of Service: [name] 4/20/2021andnbsp12:30 PM Medical Record Patient Account Number: [id number] Number: Treating RN: [name] Date of Birth/Gender: 1946-10-28 (73 y.o. Other Clinician: M) Treating Baltazar Najjar Primary Care Physician: [name] Physician/Extender: Referring Physician: Joaquin Bend in Treatment: 29 Education Assessment Education Provided To: Patient Education Topics Provided Wound/Skin Impairment: Methods: Explain/Verbal Responses: State content correctly [employer]) Signed: 02/17/2020 5:39:56 PM By: [name] RN Entered By: [name] on 02/17/2020 13:19:20 -------------------------------------------------------------------------------- Wound Assessment Details Patient Name: Date of Service: [name], [name] 02/17/2020 12:30 PM Medical Record [id number] Patient Account Number: [id number] Date of Birth/Sex: Treating RN: [birth year] (73 y.o.  [name] Primary Care Keyan Folson: [name] Other Clinician: Referring Rameen Gohlke: Treating Yanelis Osika/Extender:[name], [name], [name] in Treatment: 29 Wound Status Wound Number: 5 Primary Diabetic Wound/Ulcer of the Lower Extremity Etiology: Wound Location: Left Metatarsal head fifth Wound Open Wounding Event: Gradually Appeared Status: Date Acquired: 07/14/2019 Comorbid Cataracts, Chronic sinus problems/congestion, Weeks Of Treatment: 29 History: Coronary Artery Disease, Hypertension, Clustered Wound: No Peripheral Arterial Disease, Type II Diabetes, Osteoarthritis, Neuropathy Wound Measurements Length: (cm) 2 Width: (cm) 1.7 Depth: (cm) 0.4 Area: (cm) 2.67 Volume: (cm) 1.068 % Reduction in Area: -4138.1% % Reduction in Volume: -8115.4% Epithelialization: None Tunneling: No Undermining: Yes Starting Position (o'clock): 1 Ending Position (o'clock): 4 Maximum Distance: (cm) 0.4 Wound Description Classification: Grade 2 Foul Odor Wound Margin: Well defined, not attached Slough/Fib Exudate Amount: Medium Exudate Type: Serosanguineous Exudate Color: red, brown Wound Bed Granulation Amount: Small (1-33%) Granulation Quality: Pink, Pale Fascia Expo Necrotic Amount: Large (67-100%) Fat Layer ( Necrotic Quality: Eschar, Adherent Slough Tendon Expo Muscle Expo Joint Expos Bone Expose After Cleansing: No rino Yes Exposed Structure sed: No Subcutaneous Tissue) Exposed: Yes sed: No sed: No ed: No d: No Treatment Notes Wound #5 (Left Metatarsal head fifth) 1. Cleanse With Wound Cleanser 3. Primary  Dressing Applied Calcium Alginate Ag Other primary dressing (specifiy in notes) 4. Secondary Dressing Foam Border Dressing Notes mupirocin Electronic Signature(s) Signed: 02/17/2020 6:13:01 PM By: Zandra Abts RN, BSN Entered By: Zandra Abts on 02/17/2020 13:04:58 -------------------------------------------------------------------------------- Vitals Details Patient Name: Date of Service: [name], [name] 02/17/2020 12:30 PM Medical Record [id number] Patient Account Number: [id number] Date of Birth/Sex: Treating RN: 1945/12/10 (74 y.o. [name] Primary Care Kendle Turbin: [name] Other Clinician: Referring Sakai Wolford: Treating Yaeko Fazekas/Extender:[name], [name], [name] in Treatment: 29 Vital Signs Time Taken: 12:58 Temperature (F): 98.4 Height (in): 69 Pulse (bpm): 75 Weight (lbs): 210 Respiratory Rate (breaths/min): 18 Body Mass Index (BMI): 31 Blood Pressure (mmHg): 116/63 Capillary Blood Glucose (mg/dl): 914 Reference Range: 80 - 120 mg / dl Notes glucose per pt report Electronic Signature(s) Signed: 02/17/2020 6:13:01 PM By: Zandra Abts RN, BSN Entered By: Zandra Abts on 02/17/2020 12:59:46

## 2020-02-17 NOTE — Progress Notes (Signed)
Todd, Guzman ([id number]) Visit Report for 02/17/2020 Debridement Details Patient Name: Date of Service: [name], [name] 02/17/2020 12:30 PM Medical Record [id number] Patient Account Number: [id number] Date of Birth/Sex: [birth year] (74 y.o. M) Treating RN: [name] Primary Care Provider: [name] Other Clinician: Referring Provider: Treating Provider/Extender:Keyle Doby, [name], [name] in Treatment: 29 Debridement Performed for Wound #5 Left Metatarsal head fifth Assessment: Performed By: Physician Ricard Dillon., MD Debridement Type: Debridement Severity of Tissue Pre Fat layer exposed Debridement: Level of Consciousness (Pre- Awake and Alert procedure): Pre-procedure Verification/Time Out Taken: Yes - 13:25 Start Time: 13:25 Pain Control: Lidocaine 5% topical ointment Total Area Debrided (L x W): 2 (cm) x 1.7 (cm) = 3.4 (cm) Tissue and other material Viable, Non-Viable, Eschar, Slough, Subcutaneous, Skin: Dermis , Skin: Epidermis, debrided: Slough Level: Skin/Subcutaneous Tissue Debridement Description: Excisional Instrument: Curette Bleeding: Moderate Hemostasis Achieved: Pressure End Time: 13:30 Procedural Pain: 0 Post Procedural Pain: 0 Response to Treatment: Procedure was tolerated well Level of Consciousness Awake and Alert (Post-procedure): Post Debridement Measurements of Total Wound Length: (cm) 2 Width: (cm) 1.7 Depth: (cm) 0.4 Volume: (cm) 1.068 Character of Wound/Ulcer Post Improved Debridement: Severity of Tissue Post Debridement: Fat layer exposed Post Procedure Diagnosis Same as Pre-procedure Electronic Signature(s) Signed: 02/17/2020 5:39:56 PM By: [name] RN Signed: 02/17/2020 5:47:38 PM By: [name] MD Entered By: [name] on 02/17/2020 13:41:50 -------------------------------------------------------------------------------- HPI Details Patient Name: Date of Service: Todd, Guzman 02/17/2020 12:30  PM Medical Record [id number] Patient Account Number: [id number] Date of Birth/Sex: Treating RN: 1946-08-20 (73 y.o. [name] Primary Care Provider: [name] Other Clinician: Referring Provider: Treating Provider/Extender:Kei Mcelhiney, [name], [name] in Treatment: 29 History of Present Illness HPI Description: 05/18/16; this is a 74year-old diabetic who is a type II diabetic on insulin. The history is that he traumatized his right foot developed a sore sometime in late March. 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 According to the patient and his wife this wound never really healed. He was seen on 2 occasions in the wound care center in [place] and had vascular studies and then was referred urgently to Dr. Bridgett Larsson of vascular surgery. He underwent an angiogram on 05/10/16. Unfortunately nothing really could be done to improve his vascular status. He had a 75-90% stenosis in the midsegment of 1 segment of the posterior femoral artery. He had a patent popliteal, his anterior tibial occluded shortly after takeoff. Perineal had a greater than 90% stenosis posterior tibial is occluded feet had no distal collaterals feed distal aspect of the transmetatarsal amputation site. The patient tells me that he had a prolonged period of Santyl by Dr. Doran Durand was some initial improvement but then this was stopped. I think they're only applying daily dressings/dry dressings. He has not had a recent x-ray of the right foot he did have one before his surgery in April. His wife by the dimensions of the wound/surgical site being followed at home since 4/20. At that point the dimensions were 0.5 x 12 x 0.2 on 7/19 this was 1.8 x 6 x 0.4. He is not currently on any  antibiotics. His hemoglobin A1c in early April was 12.9 at that point he was started on insulin. Apparently his blood sugars are much lower he has an  appointment with Dr. Legrand Como Alteimer of endocrine next week. 05/29/16 x-ray of the area did not show osteomyelitis. I think he probably needs an MRI at this point. His wife is asking about something called"Yireh" cream which is not FDA approved. I have not heard of this. 06/22/16; MRI did not really suggest osteomyelitis. There was minimal marrow edema and enhancement in the stump of the second metatarsal felt to be secondary likely to postoperative change rather than osteomyelitis. The patient has arranged his own consultation with Dr. Andree Elk at [place], apparently their daughter lives in [place] and has some connection here. Any improvement in vascular supply by Dr. Andree Elk would of course be helpful. Dr. Bridgett Larsson did not feel that anything further could be done other than amputation if wound care did not result in healing or if the area deteriorates. 06/26/16; the patient has been to see Dr. Andree Elk at [place] and had an angiogram. He is going for a procedure on Thursday which will involve catheterization. I'm not sure if this is an anterograde or retrograde approach. He has been using Santyl to the wound 07/10/16; the patient had a repeat angiogram and angioplasty at [place] by Dr. Brunetta Jeans. His angiogram showed right CFA and profundal widely patent. The right as of a.m. popliteal artery were widely patent the right anterior tibial was occluded proximally and reconstitutes at the ankle. Peroneal artery was patent to the foot. Posterior tibial artery was occluded. The patient had angioplasty of the anterior tibial artery.. This was quite successful. He was recommended for Plavix as well as aspirin. 07/17/16; the patient was close to be a nurse visit today however the outer dressing of the Apligraf fell off. Noted drainage. I was asked to see the wound. The patient is  noted an odor however his wife had noted that. Drainage with Apligraf not necessarily a bad thing. He has not been systemically unwell 07/24/16; we are still have an issue with drainage of this wound. In spite of this I applied his second Apligraf. Medially the area still is probing to bone. 08/07/16; Apligraf reapplied in general wound looks improved. 08/21/16 Apligraf #4. Wound looks much better 09/04/16 patientt's wound again today continues to appear to improve with the application of the Apligraf's. He notes no increased discomfort or concerns at this point in time. 09/18/16; the patient returns today 2 weeks after his fifth application of Apligraf. Predictably three quarters of the width of this wound has healed. The deep area that probe to bone medially is still open. The patient asked how much out-of-pocket dollars would be for additional Apligraf's. 09/25/16; now using Hydrofera Blue. He has completed 5 Apligraf applications with considerable improvement in this deep open transmetatarsal amputation site. His wound is now a triangular-shaped wound on the medial aspect. At roughly 12 to 2:00 this probes another centimeter but as opposed to in the past this does not probe to bone. The patient has been seen at [place] by Dr. Zenia Resides. He is not planning to do any more revascularization unless the wound stalls or worsens per the patient 10/02/16; 0.7 x 0.8 x 0.8. Unfortunately although the wound looks stable to improved. There is now easily probable bone. This hasn't been present for several weeks. Patient is not otherwise symptomatic he is not experiencing any pain. I did a culture of the wound bed 10/09/16. Deterioration last week. Culture grew MRSA and although there is improvement here with doxycycline prescribed over the phone I'm going to try to get him linezolid 600 twice  a day for 10 days today. 10/16/16; he is completing a weeks worth of linezolid and still has 3 more days to go. Small  triangular-shaped open area with some degree of undermining. There is still palpable bone with a curet. Overall the area appears better than last week 10/20/16 he has completed the linezolid still having some nausea and vomiting but no diarrhea. He has exposed bone this week which is a deterioration. 10/27/16 patient now has a small but probing wound down to bone. Culture of this bone that I did last week showed a few methicillin-resistant staph aureus. I have little doubt that this represents acute/subacute osteomyelitis. The patient is currently on Doxy which I will continue he also completed 10 days of linezolid. We are now in a difficult situation with this patient's foot after considerable discussion we will send him back to see Dr. Doran Durand for a surgical opinion of this I'm also going to try to arrange a infectious disease consult at [facility] hopefully week and get this prior to her usual 4-6 weeks we having Fults. The patient clearly is going to need 6 weeks of IV vancomycin. If we cannot arrange this expediently I'll have to consider ordering this myself through a home infusion company 11/03/16; the patient now has a small in terms of circumference but probing wound. No bone palpable today. The patient remains on doxycycline 100 twice a day which should support him until he sees infectious disease at [facility] next week the following week on Wednesday I believe he has an appointment with Dr. Andree Elk at [facility] who is his vascular cardiologist. Finally he has an appointment with Dr. Doran Durand on 11/22/16 we have been using silver alginate. The patient's wife states they are having trouble getting this through [facility] 11/13/16; the patient was seen by infectious disease at [facility] in the 11th PICC line placed in preparation for IV antibiotics. A tummy he has not going to get IV vancomycin o Ceftaroline. They've also ordered an MRI. Patient has a follow-up with Dr. Doran Durand on 11/22/16 and Dr. Andree Elk at  [facility] tomorrow 11/23/16 the patient is on daptomycin as directed by infectious disease at [facility]. He is also been back to see Dr. Andree Elk at [facility]. He underwent a repeat arteriogram. He had a successful PTA of the right anterior tibial artery. He is on dual antiplatelete treatment with Plavix and aspirin. Finally he had the MRI of his foot in [place]. This showed cellulitis about the foot worse distally edema and enhancement in the reminiscent of the second metatarsal was consistent with osteomyelitis therefore what I was assuming to be the first metatarsal may be actually the second. He also has a fluid collection deep to the calcaneus at the level of the calcaneal spur which could be an abscess or due to adventitial bursitis. He had a small tear in his Achilles 11/30/16; the patient continues on daptomycin as directed by infectious disease at [facility]. He is been revascularized by Dr. Andree Elk at [place] in [place]. He has been to see Gretta Arab who was the orthopedic surgeon who did his original amputation. I have not seen his not however per the patient's wife he did not offer another surgical local surgical prodecure to remove involved bone. He verbalized his usual disbelief in not just hyperbarics but any medical therapy for this condition(osteomyelitis). I discussed this in detail with the patient today including answering the question about a BKA definitively "curing" the current condition. 12/07/16; the patient continues on daptomycin  as directed by infectious disease at [facility]. This is directed at the MRSA that we cultured from his bone debridement from 12/22. Lab work today shows a white count of 8.7 hemoglobin of 10.5 which is microcytic and hypochromic differential count shows a slightly elevated monocyte count at 1.2 eosinophilic count of 0.6. His creatinine is 1.11 sedimentation rate apparently is gone from 35-34 now 40. I explained was wife I don't think this represents  a trend. His total CK is 48 12/14/16- patient is here for follow-up evaluation of his right TMA site. He continues to receive IV daptomycin per infectious disease. His serum inflammatory markers remain elevated. He complains of intermittent pain to the medial aspect of the TMA site with intermittent erythema. He voices no complaints or concerns regarding hyperbaric therapy. Overall he and his wife are expressing a frustration and discouragement regarrding the length of time of treatment. 12/21/16; small open wound at roughly the first or second metatarsal metatarsalphalyngeal joint reminiscence of his transmetatarsal amputation site he continues to receive IV daptomycin per infectious disease at [facility]. He will finish these a week tomorrow. He has lab work which I been copied on. His white count is 10.8 hemoglobin 8.9 MCV is low at 75., MCH low at 24.5 platelet count slightly elevated at 626. Differential count shows 70% neutrophils 10% monocytes and 10% eosinophils. His comprehensive metabolic panel shows a slightly low sodium at 133 albumin low at 3.1 total CK is normal at 42 sedimentation rate is much higher at 82. He has had iron studies that show a serum iron of 15 and iron binding capacity of 238 and iron saturation of 6. This is suggestive of iron deficiency. B12 and folate were normal ferritin at 113 The patient tells me that he is not eating well and he has lost weight. He feels episodically nauseated. He is coughing and gagging on mucus which she thinks is sinusitis. He has an appointment with his primary doctor at 5:00 this afternoon in [facility] 01/02/17; the patient developed a subacute pneumonitis. He was admitted to [facility] after a CT scan showed an extensive interstitial pneumonitis [I have not yet seen this]. 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 He has since  been discharged. He is not on oxygen. He feels fatigued and very short of breath with exertion. At [facility] the wound care nurse there felt that his wound was healed. He did complete his daptomycin and has follow-up with infectious disease on Friday. X-rays I did before he went to [facility] still suggested residual osteomyelitis in the anterior aspect of the second must metatarsal head. I'm not sure I would've expected any different. There was no other findings. I actually think I did this because of erythema over the first metatarsal head reminiscent 01/11/17; the patient has eosinophilic pneumonitis. He has been reviewed by pulmonology and given clearance for hyperbarics at least that's what his wife says. I'll need to see if there is note in care everywhere. Apparently the prognosis for improvement of daptomycin induced eosinophilic granulocyte is is 3 months without steroids. In the meantime infectious disease has placed him on doxycycline until the wound is closed. He is still is lost a lot of weight and his blood sugars are running in the mid 60s to low 80s fasting and at all times during the day 01/18/17; he has had adjustments in his insulin apparently his blood sugars in the morning or  over 100. He wants to restart his hyperbaric treatment we'll do this at 1:00. 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 I think there is good reason to complete his treatments in order to give him the best chance of maintaining a healed status and these DFU 3 wounds with MRSA infection in the bone 02/15/17; the patient was seen today in conjunction with HBO. He completed hyperbaric oxygen today. The open area on his transmetatarsal site has remained closed. There was an area of erythema when I saw him earlier in the week on the posterior heel although that is resolved as of today as well. He has been using a cam walker. This  is a patient who came to [place] after a transmetatarsal amputation that was necrotic and dehisced. He required revascularization percutaneously on 2 different occasions by Dr. Andree Elk of invasive cardiology at [facility]. 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 He went to [facility] infectious disease and had IV daptomycin for 5 weeks before developing eosinophilic pneumonitis and requiring an admission to hospital/ICU. He made a good recovery and is continued on doxycycline since. As mentioned his foot is closed now. He has a follow-up with Dr. Andree Elk tomorrow. He is going to [employer] on Monday for a custom-made shoe READMISSION Last visit Dr. Andree Elk [facility] 02/16/17 1. Critical limb ischemia of the RLE: s/p transmetatarsal amputation of the RLE, now completely healed. s/p right ATA percutaneous revascularization procedure x2, most recently 11/20/2016 s/p PTA of right AT 100% to less than 20% with a 2.5 x 200 balloon. He does have some residual osteomyelitis, but given the wound is closed, the orthopedist has recommended to follow. He has a fitting for a special shoe coming up next week. He has completed a course of daptomycin and doxycycline and he has been discharged by ID. He has completed a course of hyperbaric therapy. Continue Continue medical management with aspirin, Plavix and statin therapy. We will discuss ongoing Plavix therapy at follow-up in 6 months. 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 Given no symptoms, we will conservatively manage. He doesn't want to do any more invasive studies at this time, which is reasonable. The patient arrives today out of 2 concerns both on the right transmetatarsal site. 1 at the level of the reminiscent fifth metatarsal head and  the other at roughly the first or second. Both of these look like dark subcutaneous discoloration probably subdermal bleeding. He is recently obtained new adaptive footwear for the right foot. He also has a callus on the left fifth dorsal toe however he follows with podiatry for this and I don't think this is any issue. ABIs in this clinic today were 0.66 on the right and 1.06 on the left. On entrance into our clinic initially this was 0.95 and 0.92. He does not describe current claudication. They're going away on a cruise in 8 weeks and I think are trying to do the month is much as they can proactively. They follow with podiatry and have an appointment with Dr. Andree Elk in October READMISSION 06/25/18 This is a patient that we have not seen in almost a year. He is a type II diabetic with known PAD. He is followed by Dr. Andree Elk of interventional cardiology at  [facility] in [place]. He is required revascularization for significant PAD. When we first saw him he required a transmetatarsal amputation. He had underlying osteomyelitis with a nonhealing surgical wound. This eventually closed with wound care, IV antibiotics and hyperbaric oxygen. They tell me that he has a modified shoe and he is very active walking up to 4 miles a day. He is followed by Dr. Geroge Baseman of podiatry. His wife states that he underwent a removal of callus over this site on July 9. She felt there may be drainage from this site after that although she could never really determined and there was callus buildup again. On 06/01/18 there was pressure bleeding through the overlying callus. he was given a prescription for 7 days of Bactrim. Fortuitously he has an appointment with Dr. Andree Elk on 06/04/18 and he immediately underwent revascularization of the right leg although I have not had a chance to review these records in care everywhere. This was apparently done through anterior and retrograde access. On 06/06/18 he had another debridement by  Dr. Bernette Mayers. Vitamin soaking with Epsom salts for 20 minutes and applying calcium alginate. The original trans-met was on April 2017 I believe by Dr. Doran Durand. His ABI in our clinic was noncompressible today. 07/02/18; x-ray I ordered last week was negative for osteomyelitis. Swab culture was also negative. He is going to require an MRI which I have ordered today. 07/09/18; surprisingly the MRI of the foot that I ordered did not show osteomyelitis. He did suggest the possibility of cellulitis. For this reason I'll go ahead and give him a 10 day course of doxycycline. Although the previous culture of this area was negative 07/16/18; it arrives with the wound looking much the same. Roughly the same depth. He has thick subcutaneous tissue around the wound orifice but this still has roughly the same depth. Been using silver alginate. We applied Oasis #1 today 07/23/2018; still having to remove a lot of callus and thick subcutaneous tissue to actually define the wound here. Most of this seems to have closed down yet he has a comma shaped divot over the top of the area that still I think is open. We applied Oasis #2 His wife expressed concern about the tip of his left great toe. This almost looks like a small blister. She also showed it today to her podiatrist Dr. Geroge Baseman who did not think this was anything serious. I am not sure is anything serious either however I think it bears some watching. He is not in any pain however he is insensate 07/30/2018;; still on a lot of nonviable tissue over the surface of the wound however cleaning this up reveals a more substantial wound orifice but less of a probing wound depth. This is not probed to bone. There is no evidence of infection The wife is still concerned about a non-open area on the tip of his left great toe. Almost feels like a bony outgrowth. She had previously showed this to podiatry. I do not think this is a blister. A friction area would be possible  although he is really not walking according to his wife. He had a small skin tag on his right buttock but no open wound here either 08/06/2018; we applied a third Oasis last week. Unfortunately there is really no improvement. Still requiring extensive debridement to expose the wound bed from a horizontal slitlike depression. I still have not been able to get this to fill in properly. On the positive side there is now no  probable bone from when he first came into the facility. I changed him to silver alginate today after a reasonably aggressive debridement 08/13/2018; once again the patient comes in with skin and subcutaneous tissue closing over the small probing area with the underlying cavity of the wound on the right TMA site. I applied silver alginate to this last week. Prior to that we used Oasis x3 still not able to get this area granulating. He does not have a probing area of the bone which is an improvement from when I spur started working on this and an MRI did not suggest osteomyelitis. I do not see evidence of infection here but I am increasingly concerned about why I cannot get this area to granulate. Each time I debrided this is looks like this is simply a matter of getting granulation to fill in the hole and then getting epithelialization. This does not seem to happen Also I sent him back to see podiatry Dr. Earleen Newport about the what felt to be bony outgrowth on the tip of his left great toe. Apparently after heel he left the clinic last week or the next day he developed a blood blister. He did see Dr. Earleen Newport. He went on to have a debridement of the medial nail cuticle he now has an open area here as well as some denuded skin. They did an x-ray apparently does have a bony outgrowth or spur but I am not able to look at this. They are using topical antibiotics apparently there was some suggested he use Santyl. Patient's wife was anxious for my opinion of this 08/20/2018; we are able to keep  the wound open this time instead of the thick subcutaneous tissue closing over the top of it however unfortunately once again this probes to bone. I do not see any evidence of infection and previous MRI did not show osteomyelitis. I elected to go back to the Oasis to see if we can stimulate some granulation. Last saw his vascular interventional cardiologist Dr. Andree Elk at the beginning of August and he had a repeat procedure. Nevertheless I wonder how much blood flow he has down to this area With regards to the left first toe he is seeing Dr. Earleen Newport next week. They are applying Bactroban to this area. 08/27/2018 Once again he comes in with thick eschar and subcutaneous tissue over the top of the small probing hole. This does not appear to go down to bone but it still has roughly the same depth. I reapplied Oasis today Over the left great toe there appears to be more of the wound at the tip of his toe than there was last week. This has an eschar on the surface of it. Will change to Santyl. There is seeing podiatry this afternoon 09/03/2018 He comes in today with the area on the transmetatarsal's site with a fair amount of callus, nonviable tissue over the circumference but it was not closed. I removed all of this as well as some subcutaneous debris and reapplied Oasis. There is no exposed bone The area over the tip of the left great toe started off as a nodule of uncertain etiology. He has been followed with podiatry. They have been removing part of the medial nail bed. He has nonviable tissue over the wound he been using Santyl in this area 09/10/18 Unfortunately comes in with neither wound area looking improved. The transmetatarsal amputation site once Again has nonviable debris over the surface requiring debridement. Unfortunately underneath this there is nothing that  looks viable and this once again goes right down to bone. There is no purulent drainage and no erythema. Also the surgical wound  from podiatry on the left first toe has an ischemic-looking eschar over the surface of the tip of the toe I have elected not to attempt candidly debride this His wife as arranged for him to have follow-up noninvasive studies in [place] under the care of Dr. Andree Elk clinic. The question is he has known severe PAD. They had recently seen him in August. He has had revascularizations in both legs within the last 4 or 5 months. He is not complaining of pain and I cannot really get a history of claudication. He has not systemically unwell 09/20/2018 the patient has been to [facility] and been revascularized by Dr. Andree Elk earlier this week. Apparently he was able to open up the anterior tibial artery although I have not actually seen his formal report. He is going for an attempt to revascularize on the left on Monday. He is apparently working with an investigational stent for lower extremity arteries below the knee and he has talked to the patient about placing that on Monday if possible. The patient has been using silver alginate on the transmetatarsal amputation site and Santyl on the left 09/30/2018; patient had his revascularization on the left this apparently included a standard approach as well as a more distal arterial catheterization although I do not have any information on this from Dr. Andree Elk. In fact I do not even see the initial revascularization that he had on the right. I have included the arterial history from Dr. Andree Elk last note however below; ASSESSMENT/PLAN: 1. Hx of Critical limb ischemia bilateral lower extremities, PAD: -s/p transmetatarsal amputation of the RLE. -s/p right ATA percutaneous revascularization procedure x2, most recently 11/20/2016 s/p PTA of right AT 100% to less than 20% with a 2.5 x 200 balloon. -On original angiogram 05/15/2016, he had a significant 70-95% left popliteal artery stenosis with AT and PT artery occlusions and one vessel runoff via the peroneal  artery. -03/21/2018 s/p PTA of 90% left popliteal artery to <10% with a 5x20 cutting balloon, PTA of 90% left peroneal to <20% with a 3x20 balloon, PTA of 100% left AT to <20% with a 2.5x220 balloon. 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 Will plan on the LLE soon after. Risks/benefits of procedure have been discussed and patient has elected to proceed. We have been using endoform to the right TMA amputation site wound and Santyl to the left great toe 10/07/2018; the area on the tip of his left great toe looked better we have been using Santyl here. We continue to have a very difficult probing hole on the right TMA amputation site we have been using endoform. I went on to use his fifth Oasis today 10/14/2018; the tip of the left great toe continues to look better. We have been using Santyl here the surface however is healthy and I think we can change to an alginate. The right TMA has not changed. Once again he has no superficial opening there is callus and thick subcutaneous tissue over the orifice once you remove this there is the probing area that we have been dealing with without too much change. There is no palpable bone I have been placing Oasis here and put Oasis #6 in this today after  a more vigorous debridement 10/21/18; the left great toe still has necrotic surface requiring debridement. The right TMA site hasn't changed in view of the thick callus over the wound bed. With removal of this there is still the opening however this does not appear to have the same depth. Again there is no palpable bone. Oasis was replaced 10/28/2018; patient comes in with both wounds looking worse. The area over the first toe tip is now down to bone. The area over the TMA site is deeper and down to bone clearly with a increase in overall wound area. Equally  concerning on the right TMA is the complete absence of a pulse this week which is a change. We are not even able to Doppler this. On the right he has a noncompressible ABI greater than 1.4 11/04/2018. Both wounds look somewhat worse. X-rays showed no osteomyelitis of the right foot but on the left there was underlying osteomyelitis in the left great toe distal phalanx. I been on the phone to Dr. Andree Elk surface at [facility] in [place] and they are arranging for another angiogram on the right on Monday. I have him on doxycycline for the osteomyelitis in the left great toe for now. Infectious disease may be necessary 1/17; 2-week hiatus. Patient was admitted to hospital at [place]. My understanding is he underwent an angioplasty of the right anterior tibial artery and had stents placed in the left anterior artery and the left tibial peroneal trunk. This was done by Dr. Andree Elk of interventional radiology. There is no major change in either 1 of the wounds. They have been using Aquacel Ag. As far as they are aware no imaging studies were done of the foot which is indeed unfortunate. I had him on doxycycline for 2 weeks since we identified the osteomyelitis in the left great toe by plain x-ray. I have renewed that again today. I am still suspicious about osteomyelitis in the amputation site and would consider doing another MRI to compare with the one done in September. The idea of hyperbaric oxygen certainly comes up for discussion 1/24; no major change in either wound area. I have him on doxycycline for osteomyelitis at the tip of the left great toe. As noted he has been previously and recently revascularized by Dr. Andree Elk at [place]. He is tolerating the doxycycline well. For some reason we do not have an infectious disease consult yet. Culture of drainage from the right foot site last week was negative 1/31; MRI of the right foot did not show osteomyelitis of the right ankle and foot. Notable for a skin  ulceration overlying the second metatarsal stump with generalizing soft tissue edema of the ankle and foot consistent with cellulitis. Noted to have a partial-thickness tear of the Achilles tendon 7.5 cm proximal to the insertion Nothing really new in terms of symptoms. Patient's area on the tip of the left great toe is just about closed although he still has the probing area on the metatarsal amputation site. Appointment with Dr. Linus Salmons of infectious disease next week 2/7; Dr. Novella Olive did not feel that any further antibiotics were necessary he would follow-up in 2 months. The left great toe appears to be closed still some surface callus that I gently looked under high did not see anything open or anything that was threatening to be open. He still has the open area on the mid part of his TMA site using endoform 2/14; left great toe is closed and he is completing his doxycycline as  of last Sunday. He is not on any antibiotics. Unfortunately out of the right foot his wife noticed some subdermal hemorrhage this week. He had been walking 2 miles I had given him permission to do so this is not really a plantar wound. Using endoform to this wound but I changed to silver alginate this week 2/21; left great toe remains closed. Culture last week grew Streptococcus angiosis which I am not really familiar with however it is penicillin sensitive and I am going to put him on Augmentin. Not much change in the wound on the right foot the deep area is still probing precariously close to bone and the wound on the margin of the TMA is larger. 2/28; left great toe remains closed. He is completing the Augmentin I gave him last week. 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 This is being shown to Dr. Andree Elk at [place] to see if there is anything else that can be done here. He has been using silver alginate strips on the right 3/6; left great toe remains closed. He sees Dr. Andree Elk on  Monday. The area on the plantar aspect of the right foot has the same small orifice with thick callused tissue around this. However this time with removal of the callus tissue the wound is open all the way along the incision line to the end medially. We have been using silver alginate. 3/13; left toe remains closed although the area is callused. He sees Dr. Jacqualyn Posey of podiatry next week. The area on the plantar right foot looked a lot better this week. Culture I did of this was negative we use silver alginate. He is going next week for an attempt at revascularization by Dr. Andree Elk 3/23; left toe remains closed although the area is callused. He will see Dr. Jacqualyn Posey in follow-up. The area on the right plantar foot continues to look surprisingly better over the last 3 visits. We have been using silver alginate. The revascularization he was supposed to have by Dr. Andree Elk at [facility] in [place] has been canceled [facility] procedure] 4/6; the right foot remains closed albeit callused. Podiatry canceled the appointment with regards to the left great toe. He has not seen Dr. Andree Elk at [facility] but thinks that Dr. Andree Elk has "done all he can do". He would be a candidate for the hemostaemix trial Readmission 07/29/2019 Mr. Stann Mainland is a man we know well from at least 3 previous stays in this clinic. He is a type II diabetic with severe PAD followed by Dr. Andree Elk at [facility] in [place]. 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 So far everything in both of these areas has remained closed. About 2 weeks ago he went to see his podiatrist at [facility] Dr. Babs Bertin. He had a thick callus on the left fifth metatarsal head that was shaved. He has developed an open wound in this area. They have been offloading this in his diabetic shoes. The patient has not had any more  revascularizations by Dr. Andree Elk since the last time he was here. ABI in our clinic at the posterior tibial on the left was 1.06 10/6; no real change in the area on the plantar met head. Small wound with 2 mm of depth. He has thick skin probably from pressure around the wound. We have been  using silver alginate 10/13; small wound in the left fifth plantar met head. Arrives today with undermining laterally and purulent drainage. Our intake nurse cultured this. His wife stated they noticed a change in color over the last day or 2. He is not systemically unwell 10/19; small wound on the fifth plantar metatarsal head. Culture I did last week showed Staphylococcus lugdunensis. Although this could be a skin contaminant the possibility of a skin and soft tissue infection was there. I did give him empiric doxycycline which should have covered this. We are using silver alginate to the wound. We put him in a total contact cast today. 10/22; small wound on the fifth plantar metatarsal head. He has completed antibiotics. He also has severe PAD which worries me about just about any wound on this man's foot 10/29; small superficial area on the fifth plantar metatarsal head. Measuring slightly smaller. He is not currently on any antibiotics. I been using a total contact cast in this man with very severe PAD but he seems to be tolerating this well 11/5; left plantar fifth metatarsal head. Silver alginate being used under a total contact cast 11/12; wound not much different than last week. Using a #15 scalpel debridement around the wound. Still silver collagen under a total contact cast. He has severe PAD and that may be playing a role in this 11/19; disappointing that the wound is not really changed that much. I think it is come down in overall surface area because originally this was on the lateral part of the fifth metatarsal head however recently it is not really changed. Most of this is filled in but it will  not epithelialized there is surface debris on this which may be mostly related to ischemia. They have an appointment with Dr. Andree Elk on 12/9 09/24/2019 on evaluation today patient appears to be doing somewhat better with regard to the wound on the left fifth metatarsal head. Fortunately there does not appear to be any signs of active infection at this time. No fevers, chills, nausea, vomiting, or diarrhea. The wound does not appear to be completely closed and I do feel like the Hydrofera Blue is helping to some degree although I feel like the cast as well as keeping things from breaking down or getting any larger at least. As far as his wound overview it shows that the overall size of the wound is slightly smaller but really maintaining within the realm of about the same. He had no troubles with the cast which is good news. 12/1; left fifth metatarsal head. Perhaps somewhat more vibrant. We have been using Hydrofera Blue. He sees Dr. Andree Elk at [place] 1 week tomorrow. He be back next week and I hope to have a better idea which way the wound is going however I will not cast him next week in case Dr. Andree Elk wants to reevaluate things in his foot. The left leg is the leg with the stent in place and I wonder whether these are going to have to be reevaluated. 12/8; left fifth metatarsal head. Quite a bit better this week. Only a small open area remains. He sees Dr. Andree Elk at [place] tomorrow. Dr. Andree Elk is previously done his vascular interventions. He has 2 stents in the left leg as I remember things. I therefore will not put him in a total contact cast today. He will be using a forefoot off loader. We will use silver collagen on the wound. We will see him again next week 12/15; left fifth  metatarsal head. He saw Dr. Andree Elk and is scheduled for an angiogram on Thursday. Unfortunately although his wound was a lot better last week it is really deteriorated this week. Small punched-out hole with depth and overhanging  tissue. We have been using Hydrofera Blue 12/22; patient had an 80% stenosis proximal to the stent I believe in the below-knee popliteal artery. This was opened by Dr. Andree Elk. According the patient the stent itself was satisfactory. I have not been able to review this note. 12/29; patient arrives today with the wound about the same size some undermining medially. We have been using Hydrofera Blue under a total contact cast and that is what we will do again today 11/04/2019. Slightly smaller in orifice but about 4 mm undermining almost circumferentially. We have been using Hydrofera Blue. Apparently the Hydrofera Blue did not stay on the wound after we removed the cast. Some minor looking cast irritation on the right lateral 1/12; unfortunately things did not go well this week. Arrives in clinic today with a deeper wound with undermining more concerning a area of pus. Specimen was obtained for culture. We are not going to be able to put him in a cast today. 1/19; purulent material last week which I cultured showed Enterobacter cloacae. He was on ampicillin previously prescribed by his primary doctor which should not have covered this however mysteriously the wound looks somewhat better. There is less depth less erythema certainly no drainage. He will need a course of ciprofloxacin which I will provide today. 1/26; he has completed the ciprofloxacin. I don't think he needs any additional antibiotics. The areas on the left fifth plantar met head. We've been using silver alginate 2/2; comes in today with 0.4 cm of circumferential undermining around a small wound in this area. We have been using silver alginate with a forefoot offloading boot 2/9; again the wound appears larger nonviable surface and with 0.4 cm roughly of circumferential undermining. We have been using silver alginate with a forefoot offloading boot. The wound does not look infected however his wife is quick to point out about swelling  on the dorsal foot. 2/16; not much change. Comes in with a small wound with a nonviable necrotic surface. Again marked undermining that I had totally removed last week. He had a arterial Doppler last week in Dr. Andree Elk office at [facility]. They have not heard these results. They are somewhat frustrated. 2/23; if anything this is worse. Undermining increased depth. Nonviable surface that looks pale. According his wife is TBI on the right was 0.22 although I have not verified this. He is going for an angiogram with Dr. Andree Elk next Monday. We are using polymen and offloading in a forefoot offloading boot. 3/2; the patient was revascularized yesterday by [facility]. He had successful PTA of the left peroneal 60% to less than 20% and successful PTA of the left ATA 100% to less than 20%. He now has a pulse in the left foot Now that he has some additional blood flow there is not a lot of options here. On one hand we want activity to help keep blood flow augmented on the other hand pressure relief is going to be paramount if we have any chance to get this to close. After some discussion with his wife and patient I went ahead and put him in a total contact cast. 3/9; he arrives in clinic today with some debris over the wound surface. We use silver collagen under a total contact cast  after his revascularization by Dr. Andree Elk 3/16; wound about the same depth at 0.5 cm. Surface area perhaps slightly less but it is the depth the really is important. We have been using silver collagen 3/23; not much change here. There is still undermining medially the depth is about the same tissue looks somewhat better. We switched to Iodoflex last week to try to get a better looking wound surface. He does have a small abrasion over the proximal interphalangeal joint dorsally. I this is so small it is difficult even to tell whether it is open. Some abrasion between the fifth toe and the webspace as well 3/30;  if anything the wound is deeper this week and larger. Very disappointing. Under illumination debris on the surface which I debrided gently with a #3 curette there is minimal bleeding. We had been using iodoflex under a total contact cast. I am going to take him out of the cast and change him to [place] today which his wife will change daily. 4/6; wound bed looks better. Still undermining medially. Some maceration and erythema around the wound. We have been using Santyl change daily. I took him out of a total contact cast last week 4/13; patient's culture from last time grew MSSA. The wound is larger not a very viable surface. Duplex ultrasound I did last week rule out DVT was negative. He still has a fair amount of pitting edema in the lower leg and dorsal foot but I am not really certain why. I started him on Keflex 500 every 6 for 10 days starting yesterday His wife tells me that Dr. Andree Elk office in [place] has been in contact they want to look at restudying I believe to see if there is further procedures planned. I told his wife he still has a feeble dorsalis pedis pulse certainly not as robust as after the last procedure although this could be partially because of edema 4/20; he has completed his antibiotics 2 weeks worth of Keflex for MSSA. He has necrotic material over a large portion of this wound. The swelling in his leg is come down presumably resolved with treatment of the infection. He will have a arterial ultrasound by Dr. Andree Elk on Friday and apparently they see him later that day. We are using Bactroban and silver alginate Electronic Signature(s) Signed: 02/17/2020 5:47:38 PM By: [name] MD Entered By: [name] on 02/17/2020 13:42:51 -------------------------------------------------------------------------------- Physical Exam Details Patient Name: Date of Service: DARALD, UZZLE 02/17/2020 12:30 PM Medical Record [id number] Patient Account Number:  [id number] Date of Birth/Sex: Treating RN: 1946-06-25 (73 y.o. [name] Primary Care Provider: [name] Other Clinician: Referring Provider: Treating Provider/Extender:Kameryn Tisdel, [name], [name] in Treatment: 29 Cardiovascular There is no pedal pulses palpable in the left foot. Integumentary (Hair, Skin) Erythema around the wound is a lot better. Notes Wound exam; the surface of this is deteriorated markedly. Necrotic subcutaneous debris over the entirety of the wound surface. Some of this was coming off which I removed with a #5 curette. There is still not a viable surface here no particular evidence of surrounding infection Electronic Signature(s) Signed: 02/17/2020 5:47:38 PM By: [name] MD Entered By: [name] on 02/17/2020 13:43:45 -------------------------------------------------------------------------------- Physician Orders Details Patient Name: Date of Service: ESTANISLADO, SURGEON 02/17/2020 12:30 PM Medical Record [id number] Patient Account Number: [id number] Date of Birth/Sex: Treating RN: [birth year] (73 y.o. [name] Primary Care Provider: [name] Other Clinician: Referring Provider: Treating Provider/Extender:Purnell Daigle, [name], [name] in Treatment: 29 Verbal / Phone  Orders: No Diagnosis Coding ICD-10 Coding Code Description E11.621 Type 2 diabetes mellitus with foot ulcer E11.51 Type 2 diabetes mellitus with diabetic peripheral angiopathy without gangrene L97.521 Non-pressure chronic ulcer of other part of left foot limited to breakdown of skin Follow-up Appointments Return Appointment in 1 week. Dressing Change Frequency Wound #5 Left Metatarsal head fifth Change dressing every day. Wound Cleansing Wound #5 Left Metatarsal head fifth May shower and wash wound with soap and water. Primary Wound Dressing Wound #5 Left Metatarsal head fifth Calcium Alginate with Silver - apply bactroban to wound bed  under calcium alginate Ag. Other: - pad right great toe for protection Secondary Dressing Dry Gauze Foam Border Off-Loading Wedge shoe to: - with felt. apply to left foot. Additional Orders / Instructions Other: - closely monitor redness. Electronic Signature(s) Signed: 02/17/2020 5:39:56 PM By: [name] RN Signed: 02/17/2020 5:47:38 PM By: [name] MD Entered By: [name] on 02/17/2020 13:18:53 -------------------------------------------------------------------------------- Problem List Details Patient Name: Date of Service: CORTNEY, MCKINNEY 02/17/2020 12:30 PM Medical Record [id number] Patient Account Number: [id number] Date of Birth/Sex: Treating RN: 01/29/1946 (73 y.o. [name] Primary Care Provider: [name] Other Clinician: Referring Provider: Treating Provider/Extender:Domnique Vantine, [name], [name] in Treatment: 29 Active Problems ICD-10 Evaluated Encounter Code Description Active Date Today Diagnosis E11.621 Type 2 diabetes mellitus with foot ulcer 07/29/2019 No Yes E11.51 Type 2 diabetes mellitus with diabetic peripheral 07/29/2019 No Yes angiopathy without gangrene L97.521 Non-pressure chronic ulcer of other part of left foot 07/29/2019 No Yes limited to breakdown of skin Inactive Problems ICD-10 Code Description Active Date Inactive Date L03.116 Cellulitis of left lower limb 08/12/2019 08/12/2019 Resolved Problems Electronic Signature(s) Signed: 02/17/2020 5:47:38 PM By: [name] MD Entered By: [name] on 02/17/2020 13:41:29 -------------------------------------------------------------------------------- Progress Note Details Patient Name: Date of Service: CRYSTIAN, FRITH 02/17/2020 12:30 PM Medical Record [id number] Patient Account Number: [id number] Date of Birth/Sex: Treating RN: 04-Jun-1946 (73 y.o. [name] Primary Care Provider: [name] Other Clinician: Referring Provider: Treating  Provider/Extender:Quantavius Humm, [name], [name] in Treatment: 29 Subjective History of Present Illness (HPI) 05/18/16; this is a 74year-old diabetic who is a type II diabetic on insulin. The history is that he traumatized his right foot developed a sore sometime in late March. 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 According to the patient and his wife this wound never really healed. He was seen on 2 occasions in the wound care center in [place] and had vascular studies and then was referred urgently to Dr. Bridgett Larsson of vascular surgery. He underwent an angiogram on 05/10/16. Unfortunately nothing really could be done to improve his vascular status. He had a 75-90% stenosis in the midsegment of 1 segment of the posterior femoral artery. He had a patent popliteal, his anterior tibial occluded shortly after takeoff. Perineal had a greater than 90% stenosis posterior tibial is occluded feet had no distal collaterals feed distal aspect of the transmetatarsal amputation site. The patient tells me that he had a prolonged period of Santyl by Dr. Doran Durand was some initial improvement but then this was stopped. I think they're only applying daily dressings/dry dressings. He has not had a recent x-ray of the right foot he did have one before his surgery in April. His wife by  the dimensions of the wound/surgical site being followed at home since 4/20. At that point the dimensions were 0.5 x 12 x 0.2 on 7/19 this was 1.8 x 6 x 0.4. He is not currently on any antibiotics. His hemoglobin A1c in early April was 12.9 at that point he was started on insulin. Apparently his blood sugars are much lower he has an appointment with Dr. Legrand Como Alteimer of endocrine next week. 05/29/16 x-ray of  the area did not show osteomyelitis. I think he probably needs an MRI at this point. His wife is asking about something called"Yireh" cream which is not FDA approved. I have not heard of this. 06/22/16; MRI did not really suggest osteomyelitis. There was minimal marrow edema and enhancement in the stump of the second metatarsal felt to be secondary likely to postoperative change rather than osteomyelitis. The patient has arranged his own consultation with Dr. Andree Elk at [place], apparently their daughter lives in [place] and has some connection here. Any improvement in vascular supply by Dr. Andree Elk would of course be helpful. Dr. Bridgett Larsson did not feel that anything further could be done other than amputation if wound care did not result in healing or if the area deteriorates. 06/26/16; the patient has been to see Dr. Andree Elk at [place] and had an angiogram. He is going for a procedure on Thursday which will involve catheterization. I'm not sure if this is an anterograde or retrograde approach. He has been using Santyl to the wound 07/10/16; the patient had a repeat angiogram and angioplasty at [facility] by Dr. Brunetta Jeans. His angiogram showed right CFA and profundal widely patent. The right as of a.m. popliteal artery were widely patent the right anterior tibial was occluded proximally and reconstitutes at the ankle. Peroneal artery was patent to the foot. Posterior tibial artery was occluded. The patient had angioplasty of the anterior tibial artery.. This was quite successful. He was recommended for Plavix as well as aspirin. 07/17/16; the patient was close to be a nurse visit today however the outer dressing of the Apligraf fell off. Noted drainage. I was asked to see the wound. The patient is noted an odor however his wife had noted that. Drainage with Apligraf not necessarily a bad thing. He has not been systemically unwell 07/24/16; we are still have an issue with drainage of this wound. In spite of this I applied  his second Apligraf. Medially the area still is probing to bone. 08/07/16; Apligraf reapplied in general wound looks improved. 08/21/16 Apligraf #4. Wound looks much better 09/04/16 patientt's wound again today continues to appear to improve with the application of the Apligraf's. He notes no increased discomfort or concerns at this point in time. 09/18/16; the patient returns today 2 weeks after his fifth application of Apligraf. Predictably three quarters of the width of this wound has healed. The deep area that probe to bone medially is still open. The patient asked how much out-of-pocket dollars would be for additional Apligraf's. 09/25/16; now using Hydrofera Blue. He has completed 5 Apligraf applications with considerable improvement in this deep open transmetatarsal amputation site. His wound is now a triangular-shaped wound on the medial aspect. At roughly 12 to 2:00 this probes another centimeter but as opposed to in the past this does not probe to bone. The patient has been seen at [facility] by Dr. Zenia Resides. He is not planning to do any more revascularization unless the wound stalls or worsens per the patient 10/02/16; 0.7 x 0.8 x 0.8. Unfortunately although the  wound looks stable to improved. There is now easily probable bone. This hasn't been present for several weeks. Patient is not otherwise symptomatic he is not experiencing any pain. I did a culture of the wound bed 10/09/16. Deterioration last week. Culture grew MRSA and although there is improvement here with doxycycline prescribed over the phone I'm going to try to get him linezolid 600 twice a day for 10 days today. 10/16/16; he is completing a weeks worth of linezolid and still has 3 more days to go. Small triangular-shaped open area with some degree of undermining. There is still palpable bone with a curet. Overall the area appears better than last week 10/20/16 he has completed the linezolid still having some nausea and vomiting but  no diarrhea. He has exposed bone this week which is a deterioration. 10/27/16 patient now has a small but probing wound down to bone. Culture of this bone that I did last week showed a few methicillin-resistant staph aureus. I have little doubt that this represents acute/subacute osteomyelitis. The patient is currently on Doxy which I will continue he also completed 10 days of linezolid. We are now in a difficult situation with this patient's foot after considerable discussion we will send him back to see Dr. Doran Durand for a surgical opinion of this I'm also going to try to arrange a infectious disease consult at [facility] hopefully week and get this prior to her usual 4-6 weeks we having [facility]. The patient clearly is going to need 6 weeks of IV vancomycin. If we cannot arrange this expediently I'll have to consider ordering this myself through a home infusion company 11/03/16; the patient now has a small in terms of circumference but probing wound. No bone palpable today. The patient remains on doxycycline 100 twice a day which should support him until he sees infectious disease at [facility] next week the following week on Wednesday I believe he has an appointment with Dr. Andree Elk at [facility] who is his vascular cardiologist. Finally he has an appointment with Dr. Doran Durand on 11/22/16 we have been using silver alginate. The patient's wife states they are having trouble getting this through [facility] 11/13/16; the patient was seen by infectious disease at [facility] in the 11th PICC line placed in preparation for IV antibiotics. A tummy he has not going to get IV vancomycin o Ceftaroline. They've also ordered an MRI. Patient has a follow-up with Dr. Doran Durand on 11/22/16 and Dr. Andree Elk at [facility] tomorrow 11/23/16 the patient is on daptomycin as directed by infectious disease at [facility]. He is also been back to see Dr. Andree Elk at [facility]. He underwent a repeat arteriogram. He had a successful PTA of the right  anterior tibial artery. He is on dual antiplatelete treatment with Plavix and aspirin. Finally he had the MRI of his foot in [place]. This showed cellulitis about the foot worse distally edema and enhancement in the reminiscent of the second metatarsal was consistent with osteomyelitis therefore what I was assuming to be the first metatarsal may be actually the second. He also has a fluid collection deep to the calcaneus at the level of the calcaneal spur which could be an abscess or due to adventitial bursitis. He had a small tear in his Achilles 11/30/16; the patient continues on daptomycin as directed by infectious disease at [facility]. He is been revascularized by Dr. Andree Elk at [facility] in [place]. He has been to see Gretta Arab who was the orthopedic surgeon who did his original amputation. I  have not seen his not however per the patient's wife he did not offer another surgical local surgical prodecure to remove involved bone. He verbalized his usual disbelief in not just hyperbarics but any medical therapy for this condition(osteomyelitis). I discussed this in detail with the patient today including answering the question about a BKA definitively "curing" the current condition. 12/07/16; the patient continues on daptomycin as directed by infectious disease at [facility]. This is directed at the MRSA that we cultured from his bone debridement from 12/22. Lab work today shows a white count of 8.7 hemoglobin of 10.5 which is microcytic and hypochromic differential count shows a slightly elevated monocyte count at 1.2 eosinophilic count of 0.6. His creatinine is 1.11 sedimentation rate apparently is gone from 35-34 now 40. I explained was wife I don't think this represents a trend. His total CK is 48 12/14/16- patient is here for follow-up evaluation of his right TMA site. He continues to receive IV daptomycin per infectious disease. His serum inflammatory markers remain elevated. He complains of  intermittent pain to the medial aspect of the TMA site with intermittent erythema. He voices no complaints or concerns regarding hyperbaric therapy. Overall he and his wife are expressing a frustration and discouragement regarrding the length of time of treatment. 12/21/16; small open wound at roughly the first or second metatarsal metatarsalphalyngeal joint reminiscence of his transmetatarsal amputation site he continues to receive IV daptomycin per infectious disease at [facility]. He will finish these a week tomorrow. He has lab work which I been copied on. His white count is 10.8 hemoglobin 8.9 MCV is low at 75., MCH low at 24.5 platelet count slightly elevated at 626. Differential count shows 70% neutrophils 10% monocytes and 10% eosinophils. His comprehensive metabolic panel shows a slightly low sodium at 133 albumin low at 3.1 total CK is normal at 42 sedimentation rate is much higher at 82. He has had iron studies that show a serum iron of 15 and iron binding capacity of 238 and iron saturation of 6. This is suggestive of iron deficiency. B12 and folate were normal ferritin at 113 The patient tells me that he is not eating well and he has lost weight. He feels episodically nauseated. He is coughing and gagging on mucus which she thinks is sinusitis. He has an appointment with his primary doctor at 5:00 this afternoon in [facility] 01/02/17; the patient developed a subacute pneumonitis. He was admitted to [facility] after a CT scan showed an extensive interstitial pneumonitis [I have not yet seen this]. 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 He has since been discharged. He is not on oxygen. He feels fatigued and very short of breath with exertion. At [facility] the wound care nurse there felt that his wound was healed. He did complete his daptomycin and has follow-up with  infectious disease on Friday. X-rays I did before he went to [facility] still suggested residual osteomyelitis in the anterior aspect of the second must metatarsal head. I'm not sure I would've expected any different. There was no other findings. I actually think I did this because of erythema over the first metatarsal head reminiscent 01/11/17; the patient has eosinophilic pneumonitis. He has been reviewed by pulmonology and given clearance for hyperbarics at least that's what his wife says. I'll need to see if there is note in care everywhere. Apparently the prognosis for improvement of daptomycin induced eosinophilic granulocyte is  is 3 months without steroids. In the meantime infectious disease has placed him on doxycycline until the wound is closed. He is still is lost a lot of weight and his blood sugars are running in the mid 60s to low 80s fasting and at all times during the day 01/18/17; he has had adjustments in his insulin apparently his blood sugars in the morning or over 100. He wants to restart his hyperbaric treatment we'll do this at 1:00. 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 I think there is good reason to complete his treatments in order to give him the best chance of maintaining a healed status and these DFU 3 wounds with MRSA infection in the bone 02/15/17; the patient was seen today in conjunction with HBO. He completed hyperbaric oxygen today. The open area on his transmetatarsal site has remained closed. There was an area of erythema when I saw him earlier in the week on the posterior heel although that is resolved as of today as well. He has been using a cam walker. This is a patient who came to [place] after a transmetatarsal amputation that was necrotic and dehisced. He required revascularization percutaneously on 2 different occasions by Dr. Andree Elk of invasive cardiology at [facility].  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 He went to [facility] infectious disease and had IV daptomycin for 5 weeks before developing eosinophilic pneumonitis and requiring an admission to hospital/ICU. He made a good recovery and is continued on doxycycline since. As mentioned his foot is closed now. He has a follow-up with Dr. Andree Elk tomorrow. He is going to [employer] on Monday for a custom-made shoe READMISSION Last visit Dr. Andree Elk [facility] 02/16/17 1. Critical limb ischemia of the RLE:  s/p transmetatarsal amputation of the RLE, now completely healed.  s/p right ATA percutaneous revascularization procedure x2, most recently 11/20/2016 s/p PTA of right AT 100% to less than 20% with a 2.5 x 200 balloon.  He does have some residual osteomyelitis, but given the wound is closed, the orthopedist has recommended to follow. He has a fitting for a special shoe coming up next week. He has completed a course of daptomycin and doxycycline and he has been discharged by ID. He has completed a course of hyperbaric therapy.  Continue Continue medical management with aspirin, Plavix and statin therapy. We will discuss ongoing Plavix therapy at follow-up in 6 months.  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 Given no symptoms, we will conservatively manage. He doesn't want to do any more invasive studies at this time, which is reasonable. The patient arrives today out of 2 concerns both on the right transmetatarsal site. 1 at the level of the reminiscent fifth metatarsal head and the other at roughly the first or second. Both of these look like dark subcutaneous discoloration probably subdermal bleeding. He is recently obtained new adaptive footwear for the right foot. He  also has a callus on the left fifth dorsal toe however he follows with podiatry for this and I don't think this is any issue. ABIs in this clinic today were 0.66 on the right and 1.06 on the left. On entrance into our clinic initially this was 0.95 and 0.92. He does not describe current claudication.  They're going away on a cruise in 8 weeks and I think are trying to do the month is much as they can proactively. They follow with podiatry and have an appointment with Dr. Andree Elk in October READMISSION 06/25/18 This is a patient that we have not seen in almost a year. He is a type II diabetic with known PAD. He is followed by Dr. Andree Elk of interventional cardiology at [facility] in [place]. He is required revascularization for significant PAD. When we first saw him he required a transmetatarsal amputation. He had underlying osteomyelitis with a nonhealing surgical wound. This eventually closed with wound care, IV antibiotics and hyperbaric oxygen. They tell me that he has a modified shoe and he is very active walking up to 4 miles a day. He is followed by Dr. Geroge Baseman of podiatry. His wife states that he underwent a removal of callus over this site on July 9. She felt there may be drainage from this site after that although she could never really determined and there was callus buildup again. On 06/01/18 there was pressure bleeding through the overlying callus. he was given a prescription for 7 days of Bactrim. Fortuitously he has an appointment with Dr. Andree Elk on 06/04/18 and he immediately underwent revascularization of the right leg although I have not had a chance to review these records in care everywhere. This was apparently done through anterior and retrograde access. On 06/06/18 he had another debridement by Dr. Bernette Mayers. Vitamin soaking with Epsom salts for 20 minutes and applying calcium alginate. The original trans-met was on April 2017 I believe by Dr. Doran Durand. His ABI in our clinic was  noncompressible today. 07/02/18; x-ray I ordered last week was negative for osteomyelitis. Swab culture was also negative. He is going to require an MRI which I have ordered today. 07/09/18; surprisingly the MRI of the foot that I ordered did not show osteomyelitis. He did suggest the possibility of cellulitis. For this reason I'll go ahead and give him a 10 day course of doxycycline. Although the previous culture of this area was negative 07/16/18; it arrives with the wound looking much the same. Roughly the same depth. He has thick subcutaneous tissue around the wound orifice but this still has roughly the same depth. Been using silver alginate. We applied Oasis #1 today 07/23/2018; still having to remove a lot of callus and thick subcutaneous tissue to actually define the wound here. Most of this seems to have closed down yet he has a comma shaped divot over the top of the area that still I think is open. We applied Oasis #2 His wife expressed concern about the tip of his left great toe. This almost looks like a small blister. She also showed it today to her podiatrist Dr. Geroge Baseman who did not think this was anything serious. I am not sure is anything serious either however I think it bears some watching. He is not in any pain however he is insensate 07/30/2018;; still on a lot of nonviable tissue over the surface of the wound however cleaning this up reveals a more substantial wound orifice but less of a probing wound depth. This is not probed to bone. There is no evidence of infection The wife is still concerned about a non-open area on the tip of his left great toe. Almost feels like a bony outgrowth. She had previously showed this to podiatry. I do not think this is a blister. A friction area would be possible although he is really not  walking according to his wife. He had a small skin tag on his right buttock but no open wound here either 08/06/2018; we applied a third Oasis last week.  Unfortunately there is really no improvement. Still requiring extensive debridement to expose the wound bed from a horizontal slitlike depression. I still have not been able to get this to fill in properly. On the positive side there is now no probable bone from when he first came into the facility. I changed him to silver alginate today after a reasonably aggressive debridement 08/13/2018; once again the patient comes in with skin and subcutaneous tissue closing over the small probing area with the underlying cavity of the wound on the right TMA site. I applied silver alginate to this last week. Prior to that we used Oasis x3 still not able to get this area granulating. He does not have a probing area of the bone which is an improvement from when I spur started working on this and an MRI did not suggest osteomyelitis. I do not see evidence of infection here but I am increasingly concerned about why I cannot get this area to granulate. Each time I debrided this is looks like this is simply a matter of getting granulation to fill in the hole and then getting epithelialization. This does not seem to happen Also I sent him back to see podiatry Dr. Earleen Newport about the what felt to be bony outgrowth on the tip of his left great toe. Apparently after heel he left the clinic last week or the next day he developed a blood blister. He did see Dr. Earleen Newport. He went on to have a debridement of the medial nail cuticle he now has an open area here as well as some denuded skin. They did an x-ray apparently does have a bony outgrowth or spur but I am not able to look at this. They are using topical antibiotics apparently there was some suggested he use Santyl. Patient's wife was anxious for my opinion of this 08/20/2018; we are able to keep the wound open this time instead of the thick subcutaneous tissue closing over the top of it however unfortunately once again this probes to bone. I do not see any evidence of  infection and previous MRI did not show osteomyelitis. I elected to go back to the Oasis to see if we can stimulate some granulation. Last saw his vascular interventional cardiologist Dr. Andree Elk at the beginning of August and he had a repeat procedure. Nevertheless I wonder how much blood flow he has down to this area With regards to the left first toe he is seeing Dr. Earleen Newport next week. They are applying Bactroban to this area. 08/27/2018 ooOnce again he comes in with thick eschar and subcutaneous tissue over the top of the small probing hole. This does not appear to go down to bone but it still has roughly the same depth. I reapplied Oasis today ooOver the left great toe there appears to be more of the wound at the tip of his toe than there was last week. This has an eschar on the surface of it. Will change to Santyl. There is seeing podiatry this afternoon 09/03/2018 ooHe comes in today with the area on the transmetatarsal's site with a fair amount of callus, nonviable tissue over the circumference but it was not closed. I removed all of this as well as some subcutaneous debris and reapplied Oasis. There is no exposed bone ooThe area over the tip of the left  great toe started off as a nodule of uncertain etiology. He has been followed with podiatry. They have been removing part of the medial nail bed. He has nonviable tissue over the wound he been using Santyl in this area 09/10/18 ooUnfortunately comes in with neither wound area looking improved. The transmetatarsal amputation site once Again has nonviable debris over the surface requiring debridement. Unfortunately underneath this there is nothing that looks viable and this once again goes right down to bone. There is no purulent drainage and no erythema. ooAlso the surgical wound from podiatry on the left first toe has an ischemic-looking eschar over the surface of the tip of the toe I have elected not to attempt candidly debride  this ooHis wife as arranged for him to have follow-up noninvasive studies in [place] under the care of Dr. Andree Elk clinic. The question is he has known severe PAD. They had recently seen him in August. He has had revascularizations in both legs within the last 4 or 5 months. He is not complaining of pain and I cannot really get a history of claudication. He has not systemically unwell 09/20/2018 the patient has been to [facility] and been revascularized by Dr. Andree Elk earlier this week. Apparently he was able to open up the anterior tibial artery although I have not actually seen his formal report. He is going for an attempt to revascularize on the left on Monday. He is apparently working with an investigational stent for lower extremity arteries below the knee and he has talked to the patient about placing that on Monday if possible. The patient has been using silver alginate on the transmetatarsal amputation site and Santyl on the left 09/30/2018; patient had his revascularization on the left this apparently included a standard approach as well as a more distal arterial catheterization although I do not have any information on this from Dr. Andree Elk. In fact I do not even see the initial revascularization that he had on the right. I have included the arterial history from Dr. Andree Elk last note however below; ASSESSMENT/PLAN: 1. Hx of Critical limb ischemia bilateral lower extremities, PAD: -s/p transmetatarsal amputation of the RLE. -s/p right ATA percutaneous revascularization procedure x2, most recently 11/20/2016 s/p PTA of right AT 100% to less than 20% with a 2.5 x 200 balloon. 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 -03/21/2018 s/p PTA of 90% left popliteal artery to <10% with a 5x20 cutting balloon, PTA of 90% left peroneal to <20% with a 3x20 balloon, PTA of  100% left AT to <20% with a 2.5x220 balloon. 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 Will plan on the LLE soon after. Risks/benefits of procedure have been discussed and patient has elected to proceed. We have been using endoform to the right TMA amputation site wound and Santyl to the left great toe 10/07/2018; the area on the tip of his left great toe looked better we have been using Santyl here. We continue to have a very difficult probing hole on the right TMA amputation site we have been using endoform. I went on to use his fifth Oasis today 10/14/2018; the tip of the left great toe continues to look better. We have been using Santyl here the surface however is  healthy and I think we can change to an alginate. The right TMA has not changed. Once again he has no superficial opening there is callus and thick subcutaneous tissue over the orifice once you remove this there is the probing area that we have been dealing with without too much change. There is no palpable bone I have been placing Oasis here and put Oasis #6 in this today after a more vigorous debridement 10/21/18; the left great toe still has necrotic surface requiring debridement. The right TMA site hasn't changed in view of the thick callus over the wound bed. With removal of this there is still the opening however this does not appear to have the same depth. Again there is no palpable bone. Oasis was replaced 10/28/2018; patient comes in with both wounds looking worse. The area over the first toe tip is now down to bone. The area over the TMA site is deeper and down to bone clearly with a increase in overall wound area. Equally concerning on the right TMA is the complete absence of a pulse this week which is a change. We are not even able to Doppler this. On the right he has a  noncompressible ABI greater than 1.4 11/04/2018. Both wounds look somewhat worse. X-rays showed no osteomyelitis of the right foot but on the left there was underlying osteomyelitis in the left great toe distal phalanx. I been on the phone to Dr. Andree Elk surface at [facility] in [place] and they are arranging for another angiogram on the right on Monday. I have him on doxycycline for the osteomyelitis in the left great toe for now. Infectious disease may be necessary 1/17; 2-week hiatus. Patient was admitted to hospital at [place]. My understanding is he underwent an angioplasty of the right anterior tibial artery and had stents placed in the left anterior artery and the left tibial peroneal trunk. This was done by Dr. Andree Elk of interventional radiology. There is no major change in either 1 of the wounds. They have been using Aquacel Ag. As far as they are aware no imaging studies were done of the foot which is indeed unfortunate. I had him on doxycycline for 2 weeks since we identified the osteomyelitis in the left great toe by plain x-ray. I have renewed that again today. I am still suspicious about osteomyelitis in the amputation site and would consider doing another MRI to compare with the one done in September. The idea of hyperbaric oxygen certainly comes up for discussion 1/24; no major change in either wound area. I have him on doxycycline for osteomyelitis at the tip of the left great toe. As noted he has been previously and recently revascularized by Dr. Andree Elk at [place]. He is tolerating the doxycycline well. For some reason we do not have an infectious disease consult yet. Culture of drainage from the right foot site last week was negative 1/31; MRI of the right foot did not show osteomyelitis of the right ankle and foot. Notable for a skin ulceration overlying the second metatarsal stump with generalizing soft tissue edema of the ankle and foot consistent with cellulitis. Noted to have a  partial-thickness tear of the Achilles tendon 7.5 cm proximal to the insertion Nothing really new in terms of symptoms. Patient's area on the tip of the left great toe is just about closed although he still has the probing area on the metatarsal amputation site. Appointment with Dr. Linus Salmons of infectious disease next week 2/7; Dr. Novella Olive did not feel that  any further antibiotics were necessary he would follow-up in 2 months. The left great toe appears to be closed still some surface callus that I gently looked under high did not see anything open or anything that was threatening to be open. He still has the open area on the mid part of his TMA site using endoform 2/14; left great toe is closed and he is completing his doxycycline as of last Sunday. He is not on any antibiotics. Unfortunately out of the right foot his wife noticed some subdermal hemorrhage this week. He had been walking 2 miles I had given him permission to do so this is not really a plantar wound. Using endoform to this wound but I changed to silver alginate this week 2/21; left great toe remains closed. Culture last week grew Streptococcus angiosis which I am not really familiar with however it is penicillin sensitive and I am going to put him on Augmentin. Not much change in the wound on the right foot the deep area is still probing precariously close to bone and the wound on the margin of the TMA is larger. 2/28; left great toe remains closed. He is completing the Augmentin I gave him last week. 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 This is being shown to Dr. Andree Elk at [place] to see if there is anything else that can be done here. He has been using silver alginate strips on the right 3/6; left great toe remains closed. He sees Dr. Andree Elk on Monday. The area on the plantar aspect of the right foot has the same small orifice with thick callused tissue around this. However this time with removal  of the callus tissue the wound is open all the way along the incision line to the end medially. We have been using silver alginate. 3/13; left toe remains closed although the area is callused. He sees Dr. Jacqualyn Posey of podiatry next week. The area on the plantar right foot looked a lot better this week. Culture I did of this was negative we use silver alginate. He is going next week for an attempt at revascularization by Dr. Andree Elk 3/23; left toe remains closed although the area is callused. He will see Dr. Jacqualyn Posey in follow-up. The area on the right plantar foot continues to look surprisingly better over the last 3 visits. We have been using silver alginate. The revascularization he was supposed to have by Dr. Andree Elk at [facility] in [place] has been canceled [facility] procedure] 4/6; the right foot remains closed albeit callused. Podiatry canceled the appointment with regards to the left great toe. He has not seen Dr. Andree Elk at [facility] but thinks that Dr. Andree Elk has "done all he can do". He would be a candidate for the hemostaemix trial Readmission 07/29/2019 Mr. Stann Mainland is a man we know well from at least 3 previous stays in this clinic. He is a type II diabetic with severe PAD followed by Dr. Andree Elk at [facility] in [place]. 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 So far everything in both of these areas has remained closed. About 2 weeks ago he went to see his podiatrist at [facility] Dr. Babs Bertin. He had a thick callus on the left fifth metatarsal head that was shaved. He has developed an open wound in this area. They have  been offloading this in his diabetic shoes. The patient has not had any more revascularizations by Dr. Andree Elk since the last time he was here. ABI in our clinic at the posterior tibial on the left was 1.06 10/6; no real change in the area on the  plantar met head. Small wound with 2 mm of depth. He has thick skin probably from pressure around the wound. We have been using silver alginate 10/13; small wound in the left fifth plantar met head. Arrives today with undermining laterally and purulent drainage. Our intake nurse cultured this. His wife stated they noticed a change in color over the last day or 2. He is not systemically unwell 10/19; small wound on the fifth plantar metatarsal head. Culture I did last week showed Staphylococcus lugdunensis. Although this could be a skin contaminant the possibility of a skin and soft tissue infection was there. I did give him empiric doxycycline which should have covered this. We are using silver alginate to the wound. We put him in a total contact cast today. 10/22; small wound on the fifth plantar metatarsal head. He has completed antibiotics. He also has severe PAD which worries me about just about any wound on this man's foot 10/29; small superficial area on the fifth plantar metatarsal head. Measuring slightly smaller. He is not currently on any antibiotics. I been using a total contact cast in this man with very severe PAD but he seems to be tolerating this well 11/5; left plantar fifth metatarsal head. Silver alginate being used under a total contact cast 11/12; wound not much different than last week. Using a #15 scalpel debridement around the wound. Still silver collagen under a total contact cast. He has severe PAD and that may be playing a role in this 11/19; disappointing that the wound is not really changed that much. I think it is come down in overall surface area because originally this was on the lateral part of the fifth metatarsal head however recently it is not really changed. Most of this is filled in but it will not epithelialized there is surface debris on this which may be mostly related to ischemia. They have an appointment with Dr. Andree Elk on 12/9 09/24/2019 on evaluation  today patient appears to be doing somewhat better with regard to the wound on the left fifth metatarsal head. Fortunately there does not appear to be any signs of active infection at this time. No fevers, chills, nausea, vomiting, or diarrhea. The wound does not appear to be completely closed and I do feel like the Hydrofera Blue is helping to some degree although I feel like the cast as well as keeping things from breaking down or getting any larger at least. As far as his wound overview it shows that the overall size of the wound is slightly smaller but really maintaining within the realm of about the same. He had no troubles with the cast which is good news. 12/1; left fifth metatarsal head. Perhaps somewhat more vibrant. We have been using Hydrofera Blue. He sees Dr. Andree Elk at [place] 1 week tomorrow. He be back next week and I hope to have a better idea which way the wound is going however I will not cast him next week in case Dr. Andree Elk wants to reevaluate things in his foot. The left leg is the leg with the stent in place and I wonder whether these are going to have to be reevaluated. 12/8; left fifth metatarsal head. Quite a bit better this week.  Only a small open area remains. He sees Dr. Andree Elk at [place] tomorrow. Dr. Andree Elk is previously done his vascular interventions. He has 2 stents in the left leg as I remember things. I therefore will not put him in a total contact cast today. He will be using a forefoot off loader. We will use silver collagen on the wound. We will see him again next week 12/15; left fifth metatarsal head. He saw Dr. Andree Elk and is scheduled for an angiogram on Thursday. Unfortunately although his wound was a lot better last week it is really deteriorated this week. Small punched-out hole with depth and overhanging tissue. We have been using Hydrofera Blue 12/22; patient had an 80% stenosis proximal to the stent I believe in the below-knee popliteal artery. This was opened by  Dr. Andree Elk. According the patient the stent itself was satisfactory. I have not been able to review this note. 12/29; patient arrives today with the wound about the same size some undermining medially. We have been using Hydrofera Blue under a total contact cast and that is what we will do again today 11/04/2019. Slightly smaller in orifice but about 4 mm undermining almost circumferentially. We have been using Hydrofera Blue. Apparently the Hydrofera Blue did not stay on the wound after we removed the cast. Some minor looking cast irritation on the right lateral 1/12; unfortunately things did not go well this week. Arrives in clinic today with a deeper wound with undermining more concerning a area of pus. Specimen was obtained for culture. We are not going to be able to put him in a cast today. 1/19; purulent material last week which I cultured showed Enterobacter cloacae. He was on ampicillin previously prescribed by his primary doctor which should not have covered this however mysteriously the wound looks somewhat better. There is less depth less erythema certainly no drainage. He will need a course of ciprofloxacin which I will provide today. 1/26; he has completed the ciprofloxacin. I don't think he needs any additional antibiotics. The areas on the left fifth plantar met head. We've been using silver alginate 2/2; comes in today with 0.4 cm of circumferential undermining around a small wound in this area. We have been using silver alginate with a forefoot offloading boot 2/9; again the wound appears larger nonviable surface and with 0.4 cm roughly of circumferential undermining. We have been using silver alginate with a forefoot offloading boot. The wound does not look infected however his wife is quick to point out about swelling on the dorsal foot. 2/16; not much change. Comes in with a small wound with a nonviable necrotic surface. Again marked undermining that I had totally removed last  week. He had a arterial Doppler last week in Dr. Andree Elk office at [facility]. They have not heard these results. They are somewhat frustrated. 2/23; if anything this is worse. Undermining increased depth. Nonviable surface that looks pale. According his wife is TBI on the right was 0.22 although I have not verified this. He is going for an angiogram with Dr. Andree Elk next Monday. We are using polymen and offloading in a forefoot offloading boot. 3/2; the patient was revascularized yesterday by [facility]. He had successful PTA of the left peroneal 60% to less than 20% and successful PTA of the left ATA 100% to less than 20%. He now has a pulse in the left foot Now that he has some additional blood flow there is not a lot of options here. On one hand  we want activity to help keep blood flow augmented on the other hand pressure relief is going to be paramount if we have any chance to get this to close. After some discussion with his wife and patient I went ahead and put him in a total contact cast. 3/9; he arrives in clinic today with some debris over the wound surface. We use silver collagen under a total contact cast after his revascularization by Dr. Andree Elk 3/16; wound about the same depth at 0.5 cm. Surface area perhaps slightly less but it is the depth the really is important. We have been using silver collagen 3/23; not much change here. There is still undermining medially the depth is about the same tissue looks somewhat better. We switched to Iodoflex last week to try to get a better looking wound surface. He does have a small abrasion over the proximal interphalangeal joint dorsally. I this is so small it is difficult even to tell whether it is open. Some abrasion between the fifth toe and the webspace as well 3/30; if anything the wound is deeper this week and larger. Very disappointing. Under illumination debris on the surface which I debrided gently with a #3 curette there  is minimal bleeding. We had been using iodoflex under a total contact cast. I am going to take him out of the cast and change him to [place] today which his wife will change daily. 4/6; wound bed looks better. Still undermining medially. Some maceration and erythema around the wound. We have been using Santyl change daily. I took him out of a total contact cast last week 4/13; patient's culture from last time grew MSSA. The wound is larger not a very viable surface. Duplex ultrasound I did last week rule out DVT was negative. He still has a fair amount of pitting edema in the lower leg and dorsal foot but I am not really certain why. I started him on Keflex 500 every 6 for 10 days starting yesterday His wife tells me that Dr. Andree Elk office in [place] has been in contact they want to look at restudying I believe to see if there is further procedures planned. I told his wife he still has a feeble dorsalis pedis pulse certainly not as robust as after the last procedure although this could be partially because of edema 4/20; he has completed his antibiotics 2 weeks worth of Keflex for MSSA. He has necrotic material over a large portion of this wound. The swelling in his leg is come down presumably resolved with treatment of the infection. He will have a arterial ultrasound by Dr. Andree Elk on Friday and apparently they see him later that day. We are using Bactroban and silver alginate Objective Constitutional Vitals Time Taken: 12:58 PM, Height: 69 in, Weight: 210 lbs, BMI: 31, Temperature: 98.4 F, Pulse: 75 bpm, Respiratory Rate: 18 breaths/min, Blood Pressure: 116/63 mmHg, Capillary Blood Glucose: 100 mg/dl. General Notes: glucose per pt report Cardiovascular There is no pedal pulses palpable in the left foot. General Notes: Wound exam; the surface of this is deteriorated markedly. Necrotic subcutaneous debris over the entirety of the wound surface. Some of this was coming off which I removed with a  #5 curette. There is still not a viable surface here no particular evidence of surrounding infection Integumentary (Hair, Skin) Erythema around the wound is a lot better. Wound #5 status is Open. Original cause of wound was Gradually Appeared. The wound is located on the Left Metatarsal head fifth. The wound measures  2cm length x 1.7cm width x 0.4cm depth; 2.67cm^2 area and 1.068cm^3 volume. There is Fat Layer (Subcutaneous Tissue) Exposed exposed. There is no tunneling noted, however, there is undermining starting at 1:00 and ending at 4:00 with a maximum distance of 0.4cm. There is a medium amount of serosanguineous drainage noted. The wound margin is well defined and not attached to the wound base. There is small (1-33%) pink, pale granulation within the wound bed. There is a large (67-100%) amount of necrotic tissue within the wound bed including Eschar and Adherent Slough. Assessment Active Problems ICD-10 Type 2 diabetes mellitus with foot ulcer Type 2 diabetes mellitus with diabetic peripheral angiopathy without gangrene Non-pressure chronic ulcer of other part of left foot limited to breakdown of skin Procedures Wound #5 Pre-procedure diagnosis of Wound #5 is a Diabetic Wound/Ulcer of the Lower Extremity located on the Left Metatarsal head fifth .Severity of Tissue Pre Debridement is: Fat layer exposed. There was a Excisional Skin/Subcutaneous Tissue Debridement with a total area of 3.4 sq cm performed by Ricard Dillon., MD. With the following instrument(s): Curette to remove Viable and Non-Viable tissue/material. Material removed includes Eschar, Subcutaneous Tissue, Slough, Skin: Dermis, and Skin: Epidermis after achieving pain control using Lidocaine 5% topical ointment. No specimens were taken. A time out was conducted at 13:25, prior to the start of the procedure. A Moderate amount of bleeding was controlled with Pressure. The procedure was tolerated well with a pain level  of 0 throughout and a pain level of 0 following the procedure. Post Debridement Measurements: 2cm length x 1.7cm width x 0.4cm depth; 1.068cm^3 volume. Character of Wound/Ulcer Post Debridement is improved. Severity of Tissue Post Debridement is: Fat layer exposed. Post procedure Diagnosis Wound #5: Same as Pre-Procedure Plan Follow-up Appointments: Return Appointment in 1 week. Dressing Change Frequency: Wound #5 Left Metatarsal head fifth: Change dressing every day. Wound Cleansing: Wound #5 Left Metatarsal head fifth: May shower and wash wound with soap and water. Primary Wound Dressing: Wound #5 Left Metatarsal head fifth: Calcium Alginate with Silver - apply bactroban to wound bed under calcium alginate Ag. Other: - pad right great toe for protection Secondary Dressing: Dry Gauze Foam Border Off-Loading: Wedge shoe to: - with felt. apply to left foot. Additional Orders / Instructions: Other: - closely monitor redness. 1. I am continuing with the Bactroban silver alginate 2. I am going to await Dr. Andree Elk assessment of his arterial status on Friday. Clearly the surface of this wound has not become viable. Tissue destruction from MSSA plus or minus an adequate perfusion 3. I doubt he be a candidate for 5th ray amputation I doubt this would heal Electronic Signature(s) Signed: 02/17/2020 5:47:38 PM By: [name] MD Entered By: [name] on 02/17/2020 13:45:15 -------------------------------------------------------------------------------- SuperBill Details Patient Name: Date of Service: ONUR, MORI 02/17/2020 Medical Record [id number] Patient Account Number: [id number] Date of Birth/Sex: Treating RN: 13-Dec-1945 (73 y.o. [name] Primary Care Provider: [name] Other Clinician: Referring Provider: Treating Provider/Extender:Ankith Edmonston, [name], [name] in Treatment: 29 Diagnosis Coding ICD-10 Codes Code Description E11.621 Type 2  diabetes mellitus with foot ulcer E11.51 Type 2 diabetes mellitus with diabetic peripheral angiopathy without gangrene L97.521 Non-pressure chronic ulcer of other part of left foot limited to breakdown of skin Facility Procedures The patient participates with Medicare or their insurance follows the Medicare Facility Guidelines: CPT4 Code Description Modifier Quantity [id number] 11042 - DEB SUBQ TISSUE 20 SQ CM/< 1 ICD-10 Diagnosis Description E11.621 Type 2 diabetes mellitus with  foot ulcer L97.521 Non-pressure chronic ulcer of other part of left foot limited to breakdown of skin Physician Procedures CPT4 Code Description: [id number] 11042 - WC PHYS SUBQ TISS 20 SQ CM ICD-10 Diagnosis Description E11.621 Type 2 diabetes mellitus with foot ulcer L97.521 Non-pressure chronic ulcer of other part of left foot limite Modifier: d to breakdo Quantity: 1 wn of skin Electronic Signature(s) Signed: 02/17/2020 5:47:38 PM By: [name] MD Entered By: [name] on 02/17/2020 13:45:37

## 2020-02-24 ENCOUNTER — Other Ambulatory Visit: Payer: Self-pay

## 2020-02-24 ENCOUNTER — Encounter (HOSPITAL_BASED_OUTPATIENT_CLINIC_OR_DEPARTMENT_OTHER): Payer: Medicare Other | Admitting: Internal Medicine

## 2020-02-24 NOTE — Progress Notes (Signed)
[name], [name] ([id number]) Visit Report for 02/24/2020 HPI Details Patient Name: Date of Service: [name], [name] 02/24/2020 12:30 PM Medical Record [id number] Patient Account Number: [id number] Date of Birth/Sex: Treating RN: August 26, 1946 (73 y.o. [name]) [name] Primary Care Provider: [name] Other Clinician: Referring Provider: Treating Provider/Extender:Kharon Hixon, [name], [name] in Treatment: 30 History of Present Illness HPI Description: 05/18/16; this is a 74year-old diabetic who is a type II diabetic on insulin. The history is that he traumatized his right foot developed a sore sometime in late March. 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 According to the patient and his wife this wound never really healed. He was seen on 2 occasions in the wound care center in [place] and had vascular studies and then was referred urgently to Dr. Bridgett Larsson of vascular surgery. He underwent an angiogram on 05/10/16. Unfortunately nothing really could be done to improve his vascular status. He had a 75-90% stenosis in the midsegment of 1 segment of the posterior femoral artery. He had a patent popliteal, his anterior tibial occluded shortly after takeoff. Perineal had a greater than 90% stenosis posterior tibial is occluded feet had no distal collaterals feed distal aspect of the transmetatarsal amputation site. The patient tells me that he had a prolonged period of Santyl by Dr. Doran Durand was some initial improvement but then this was stopped. I think they're only applying daily dressings/dry dressings. He has not had a recent x-ray of the right foot he did have one before his surgery in April. His wife by the dimensions of the wound/surgical site being followed at  home since 4/20. At that point the dimensions were 0.5 x 12 x 0.2 on 7/19 this was 1.8 x 6 x 0.4. He is not currently on any antibiotics. His hemoglobin A1c in early April was 12.9 at that point he was started on insulin. Apparently his blood sugars are much lower he has an appointment with Dr. Legrand Como Alteimer of endocrine next week. 05/29/16 x-ray of the area did not show osteomyelitis. I think he probably needs an MRI at this point. His wife is asking about something called"Yireh" cream which is not FDA approved. I have not heard of this. 06/22/16; MRI did not really suggest osteomyelitis. There was minimal marrow edema and enhancement in the stump of the second metatarsal felt to be secondary likely to postoperative change rather than osteomyelitis. The patient has arranged his own consultation with Dr. Andree Elk at [place], apparently their daughter lives in [place] and has some connection here. Any improvement in vascular supply by Dr. Andree Elk would of course be helpful. Dr. Bridgett Larsson did not feel that anything further could be done other than amputation if wound care did not result in healing or if the area deteriorates. 06/26/16; the patient has been to see Dr. Andree Elk at [place] and had an angiogram. He is going for a procedure on Thursday which will involve catheterization. I'm not sure if this is an anterograde or retrograde approach. He has been using Santyl to the wound 07/10/16; the patient had a repeat angiogram and angioplasty at [place] by Dr. Brunetta Jeans. His angiogram showed right CFA and profundal widely patent. The right as of a.m. popliteal artery were widely patent the right anterior tibial was occluded proximally and reconstitutes at  the ankle. Peroneal artery was patent to the foot. Posterior tibial artery was occluded. The patient had angioplasty of the anterior tibial artery.. This was quite successful. He was recommended for Plavix as well as aspirin. 07/17/16; the patient was close to be a nurse  visit today however the outer dressing of the Apligraf fell off. Noted drainage. I was asked to see the wound. The patient is noted an odor however his wife had noted that. Drainage with Apligraf not necessarily a bad thing. He has not been systemically unwell 07/24/16; we are still have an issue with drainage of this wound. In spite of this I applied his second Apligraf. Medially the area still is probing to bone. 08/07/16; Apligraf reapplied in general wound looks improved. 08/21/16 Apligraf #4. Wound looks much better 09/04/16 patientt's wound again today continues to appear to improve with the application of the Apligraf's. He notes no increased discomfort or concerns at this point in time. 09/18/16; the patient returns today 2 weeks after his fifth application of Apligraf. Predictably three quarters of the width of this wound has healed. The deep area that probe to bone medially is still open. The patient asked how much out-of-pocket dollars would be for additional Apligraf's. 09/25/16; now using Hydrofera Blue. He has completed 5 Apligraf applications with considerable improvement in this deep open transmetatarsal amputation site. His wound is now a triangular-shaped wound on the medial aspect. At roughly 12 to 2:00 this probes another centimeter but as opposed to in the past this does not probe to bone. The patient has been seen at [place] by Dr. Zenia Resides. He is not planning to do any more revascularization unless the wound stalls or worsens per the patient 10/02/16; 0.7 x 0.8 x 0.8. Unfortunately although the wound looks stable to improved. There is now easily probable bone. This hasn't been present for several weeks. Patient is not otherwise symptomatic he is not experiencing any pain. I did a culture of the wound bed 10/09/16. Deterioration last week. Culture grew MRSA and although there is improvement here with doxycycline prescribed over the phone I'm going to try to get him linezolid 600 twice  a day for 10 days today. 10/16/16; he is completing a weeks worth of linezolid and still has 3 more days to go. Small triangular-shaped open area with some degree of undermining. There is still palpable bone with a curet. Overall the area appears better than last week 10/20/16 he has completed the linezolid still having some nausea and vomiting but no diarrhea. He has exposed bone this week which is a deterioration. 10/27/16 patient now has a small but probing wound down to bone. Culture of this bone that I did last week showed a few methicillin-resistant staph aureus. I have little doubt that this represents acute/subacute osteomyelitis. The patient is currently on Doxy which I will continue he also completed 10 days of linezolid. We are now in a difficult situation with this patient's foot after considerable discussion we will send him back to see Dr. Doran Durand for a surgical opinion of this I'm also going to try to arrange a infectious disease consult at [facility] hopefully week and get this prior to her usual 4-6 weeks we having [place]. The patient clearly is going to need 6 weeks of IV vancomycin. If we cannot arrange this expediently I'll have to consider ordering this myself through a home infusion company 11/03/16; the patient now has a small in terms of circumference but probing wound. No bone palpable today. The  patient remains on doxycycline 100 twice a day which should support him until he sees infectious disease at [facility] next week the following week on Wednesday I believe he has an appointment with Dr. Andree Elk at [facility] who is his vascular cardiologist. Finally he has an appointment with Dr. Doran Durand on 11/22/16 we have been using silver alginate. The patient's wife states they are having trouble getting this through [facility] 11/13/16; the patient was seen by infectious disease at [facility] in the 11th PICC line placed in preparation for IV antibiotics. A tummy he has not going to get IV  vancomycin o Ceftaroline. They've also ordered an MRI. Patient has a follow-up with Dr. Doran Durand on 11/22/16 and Dr. Andree Elk at [facility] tomorrow 11/23/16 the patient is on daptomycin as directed by infectious disease at [facility]. He is also been back to see Dr. Andree Elk at [facility]. He underwent a repeat arteriogram. He had a successful PTA of the right anterior tibial artery. He is on dual antiplatelete treatment with Plavix and aspirin. Finally he had the MRI of his foot in [place]. This showed cellulitis about the foot worse distally edema and enhancement in the reminiscent of the second metatarsal was consistent with osteomyelitis therefore what I was assuming to be the first metatarsal may be actually the second. He also has a fluid collection deep to the calcaneus at the level of the calcaneal spur which could be an abscess or due to adventitial bursitis. He had a small tear in his Achilles 11/30/16; the patient continues on daptomycin as directed by infectious disease at [facility]. He is been revascularized by Dr. Andree Elk at [place] in [place]. He has been to see Gretta Arab who was the orthopedic surgeon who did his original amputation. I have not seen his not however per the patient's wife he did not offer another surgical local surgical prodecure to remove involved bone. He verbalized his usual disbelief in not just hyperbarics but any medical therapy for this condition(osteomyelitis). I discussed this in detail with the patient today including answering the question about a BKA definitively "curing" the current condition. 12/07/16; the patient continues on daptomycin as directed by infectious disease at [facility]. This is directed at the MRSA that we cultured from his bone debridement from 12/22. Lab work today shows a white count of 8.7 hemoglobin of 10.5 which is microcytic and hypochromic differential count shows a slightly elevated monocyte count at 1.2 eosinophilic count of 0.6. His  creatinine is 1.11 sedimentation rate apparently is gone from 35-34 now 40. I explained was wife I don't think this represents a trend. His total CK is 48 12/14/16- patient is here for follow-up evaluation of his right TMA site. He continues to receive IV daptomycin per infectious disease. His serum inflammatory markers remain elevated. He complains of intermittent pain to the medial aspect of the TMA site with intermittent erythema. He voices no complaints or concerns regarding hyperbaric therapy. Overall he and his wife are expressing a frustration and discouragement regarrding the length of time of treatment. 12/21/16; small open wound at roughly the first or second metatarsal metatarsalphalyngeal joint reminiscence of his transmetatarsal amputation site he continues to receive IV daptomycin per infectious disease at [facility]. He will finish these a week tomorrow. He has lab work which I been copied on. His white count is 10.8 hemoglobin 8.9 MCV is low at 75., MCH low at 24.5 platelet count slightly elevated at 626. Differential count shows 70% neutrophils 10% monocytes and 10% eosinophils. His  comprehensive metabolic panel shows a slightly low sodium at 133 albumin low at 3.1 total CK is normal at 42 sedimentation rate is much higher at 82. He has had iron studies that show a serum iron of 15 and iron binding capacity of 238 and iron saturation of 6. This is suggestive of iron deficiency. B12 and folate were normal ferritin at 113 The patient tells me that he is not eating well and he has lost weight. He feels episodically nauseated. He is coughing and gagging on mucus which she thinks is sinusitis. He has an appointment with his primary doctor at 5:00 this afternoon in [facility] 01/02/17; the patient developed a subacute pneumonitis. He was admitted to [facility] after a CT scan showed an extensive interstitial pneumonitis [I have not yet seen this]. 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 He has since been discharged. He is not on oxygen. He feels fatigued and very short of breath with exertion. At [facility] the wound care nurse there felt that his wound was healed. He did complete his daptomycin and has follow-up with infectious disease on Friday. X-rays I did before he went to [facility] still suggested residual osteomyelitis in the anterior aspect of the second must metatarsal head. I'm not sure I would've expected any different. There was no other findings. I actually think I did this because of erythema over the first metatarsal head reminiscent 01/11/17; the patient has eosinophilic pneumonitis. He has been reviewed by pulmonology and given clearance for hyperbarics at least that's what his wife says. I'll need to see if there is note in care everywhere. Apparently the prognosis for improvement of daptomycin induced eosinophilic granulocyte is is 3 months without steroids. In the meantime infectious disease has placed him on doxycycline until the wound is closed. He is still is lost a lot of weight and his blood sugars are running in the mid 60s to low 80s fasting and at all times during the day 01/18/17; he has had adjustments in his insulin apparently his blood sugars in the morning or over 100. He wants to restart his hyperbaric treatment we'll do this at 1:00. 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 I think there is good reason to complete his treatments in order to give him the best chance of maintaining a healed status and these DFU 3 wounds with MRSA infection in the bone 02/15/17; the patient was seen today in conjunction with HBO. He completed hyperbaric oxygen today. The open area on his transmetatarsal site has remained closed. There was an area of erythema when I saw him  earlier in the week on the posterior heel although that is resolved as of today as well. He has been using a cam walker. This is a patient who came to [place] after a transmetatarsal amputation that was necrotic and dehisced. He required revascularization percutaneously on 2 different occasions by Dr. Andree Elk of invasive cardiology at [facility]. 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 He went to [facility] infectious disease and had IV daptomycin for 5 weeks before developing eosinophilic pneumonitis and requiring an admission to hospital/ICU. He made a good recovery and is continued on doxycycline since. As mentioned his foot is closed now. He has a follow-up with Dr. Andree Elk tomorrow. He is going  to [employer] on Monday for a custom-made shoe READMISSION Last visit Dr. Andree Elk [facility] 02/16/17 1. Critical limb ischemia of the RLE: s/p transmetatarsal amputation of the RLE, now completely healed. s/p right ATA percutaneous revascularization procedure x2, most recently 11/20/2016 s/p PTA of right AT 100% to less than 20% with a 2.5 x 200 balloon. He does have some residual osteomyelitis, but given the wound is closed, the orthopedist has recommended to follow. He has a fitting for a special shoe coming up next week. He has completed a course of daptomycin and doxycycline and he has been discharged by [facility]. He has completed a course of hyperbaric therapy. Continue Continue medical management with aspirin, Plavix and statin therapy. We will discuss ongoing Plavix therapy at follow-up in 6 months. 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 Given no symptoms, we will conservatively manage. He doesn't want to do any more invasive studies at this time, which is reasonable. The patient  arrives today out of 2 concerns both on the right transmetatarsal site. 1 at the level of the reminiscent fifth metatarsal head and the other at roughly the first or second. Both of these look like dark subcutaneous discoloration probably subdermal bleeding. He is recently obtained new adaptive footwear for the right foot. He also has a callus on the left fifth dorsal toe however he follows with podiatry for this and I don't think this is any issue. ABIs in this clinic today were 0.66 on the right and 1.06 on the left. On entrance into our clinic initially this was 0.95 and 0.92. He does not describe current claudication. They're going away on a cruise in 8 weeks and I think are trying to do the month is much as they can proactively. They follow with podiatry and have an appointment with Dr. Andree Elk in October READMISSION 06/25/18 This is a patient that we have not seen in almost a year. He is a type II diabetic with known PAD. He is followed by Dr. Andree Elk of interventional cardiology at [facility] in [place]. He is required revascularization for significant PAD. When we first saw him he required a transmetatarsal amputation. He had underlying osteomyelitis with a nonhealing surgical wound. This eventually closed with wound care, IV antibiotics and hyperbaric oxygen. They tell me that he has a modified shoe and he is very active walking up to 4 miles a day. He is followed by Dr. Geroge Baseman of podiatry. His wife states that he underwent a removal of callus over this site on July 9. She felt there may be drainage from this site after that although she could never really determined and there was callus buildup again. On 06/01/18 there was pressure bleeding through the overlying callus. he was given a prescription for 7 days of Bactrim. Fortuitously he has an appointment with Dr. Andree Elk on 06/04/18 and he immediately underwent revascularization of the right leg although I have not had a chance to review these  records in care everywhere. This was apparently done through anterior and retrograde access. On 06/06/18 he had another debridement by Dr. Bernette Mayers. Vitamin soaking with Epsom salts for 20 minutes and applying calcium alginate. The original trans-met was on April 2017 I believe by Dr. Doran Durand. His ABI in our clinic was noncompressible today. 07/02/18; x-ray I ordered last week was negative for osteomyelitis. Swab culture was also negative. He is going to  require an MRI which I have ordered today. 07/09/18; surprisingly the MRI of the foot that I ordered did not show osteomyelitis. He did suggest the possibility of cellulitis. For this reason I'll go ahead and give him a 10 day course of doxycycline. Although the previous culture of this area was negative 07/16/18; it arrives with the wound looking much the same. Roughly the same depth. He has thick subcutaneous tissue around the wound orifice but this still has roughly the same depth. Been using silver alginate. We applied Oasis #1 today 07/23/2018; still having to remove a lot of callus and thick subcutaneous tissue to actually define the wound here. Most of this seems to have closed down yet he has a comma shaped divot over the top of the area that still I think is open. We applied Oasis #2 His wife expressed concern about the tip of his left great toe. This almost looks like a small blister. She also showed it today to her podiatrist Dr. Geroge Baseman who did not think this was anything serious. I am not sure is anything serious either however I think it bears some watching. He is not in any pain however he is insensate 07/30/2018;; still on a lot of nonviable tissue over the surface of the wound however cleaning this up reveals a more substantial wound orifice but less of a probing wound depth. This is not probed to bone. There is no evidence of infection The wife is still concerned about a non-open area on the tip of his left great toe. Almost feels like a  bony outgrowth. She had previously showed this to podiatry. I do not think this is a blister. A friction area would be possible although he is really not walking according to his wife. He had a small skin tag on his right buttock but no open wound here either 08/06/2018; we applied a third Oasis last week. Unfortunately there is really no improvement. Still requiring extensive debridement to expose the wound bed from a horizontal slitlike depression. I still have not been able to get this to fill in properly. On the positive side there is now no probable bone from when he first came into the facility. I changed him to silver alginate today after a reasonably aggressive debridement 08/13/2018; once again the patient comes in with skin and subcutaneous tissue closing over the small probing area with the underlying cavity of the wound on the right TMA site. I applied silver alginate to this last week. Prior to that we used Oasis x3 still not able to get this area granulating. He does not have a probing area of the bone which is an improvement from when I spur started working on this and an MRI did not suggest osteomyelitis. I do not see evidence of infection here but I am increasingly concerned about why I cannot get this area to granulate. Each time I debrided this is looks like this is simply a matter of getting granulation to fill in the hole and then getting epithelialization. This does not seem to happen Also I sent him back to see podiatry Dr. Earleen Newport about the what felt to be bony outgrowth on the tip of his left great toe. Apparently after heel he left the clinic last week or the next day he developed a blood blister. He did see Dr. Earleen Newport. He went on to have a debridement of the medial nail cuticle he now has an open area here as well as some denuded skin. They  did an x-ray apparently does have a bony outgrowth or spur but I am not able to look at this. They are using topical antibiotics  apparently there was some suggested he use Santyl. Patient's wife was anxious for my opinion of this 08/20/2018; we are able to keep the wound open this time instead of the thick subcutaneous tissue closing over the top of it however unfortunately once again this probes to bone. I do not see any evidence of infection and previous MRI did not show osteomyelitis. I elected to go back to the Oasis to see if we can stimulate some granulation. Last saw his vascular interventional cardiologist Dr. Andree Elk at the beginning of August and he had a repeat procedure. Nevertheless I wonder how much blood flow he has down to this area With regards to the left first toe he is seeing Dr. Earleen Newport next week. They are applying Bactroban to this area. 08/27/2018 Once again he comes in with thick eschar and subcutaneous tissue over the top of the small probing hole. This does not appear to go down to bone but it still has roughly the same depth. I reapplied Oasis today Over the left great toe there appears to be more of the wound at the tip of his toe than there was last week. This has an eschar on the surface of it. Will change to Santyl. There is seeing podiatry this afternoon 09/03/2018 He comes in today with the area on the transmetatarsal's site with a fair amount of callus, nonviable tissue over the circumference but it was not closed. I removed all of this as well as some subcutaneous debris and reapplied Oasis. There is no exposed bone The area over the tip of the left great toe started off as a nodule of uncertain etiology. He has been followed with podiatry. They have been removing part of the medial nail bed. He has nonviable tissue over the wound he been using Santyl in this area 09/10/18 Unfortunately comes in with neither wound area looking improved. The transmetatarsal amputation site once Again has nonviable debris over the surface requiring debridement. Unfortunately underneath this there is nothing  that looks viable and this once again goes right down to bone. There is no purulent drainage and no erythema. Also the surgical wound from podiatry on the left first toe has an ischemic-looking eschar over the surface of the tip of the toe I have elected not to attempt candidly debride this His wife as arranged for him to have follow-up noninvasive studies in [place] under the care of Dr. Andree Elk clinic. The question is he has known severe PAD. They had recently seen him in August. He has had revascularizations in both legs within the last 4 or 5 months. He is not complaining of pain and I cannot really get a history of claudication. He has not systemically unwell 09/20/2018 the patient has been to [facility] and been revascularized by Dr. Andree Elk earlier this week. Apparently he was able to open up the anterior tibial artery although I have not actually seen his formal report. He is going for an attempt to revascularize on the left on Monday. He is apparently working with an investigational stent for lower extremity arteries below the knee and he has talked to the patient about placing that on Monday if possible. The patient has been using silver alginate on the transmetatarsal amputation site and Santyl on the left 09/30/2018; patient had his revascularization on the left this apparently included a standard approach  as well as a more distal arterial catheterization although I do not have any information on this from Dr. Andree Elk. In fact I do not even see the initial revascularization that he had on the right. I have included the arterial history from Dr. Andree Elk last note however below; ASSESSMENT/PLAN: 1. Hx of Critical limb ischemia bilateral lower extremities, PAD: -s/p transmetatarsal amputation of the RLE. -s/p right ATA percutaneous revascularization procedure x2, most recently 11/20/2016 s/p PTA of right AT 100% to less than 20% with a 2.5 x 200 balloon. -On original angiogram 05/15/2016, he  had a significant 70-95% left popliteal artery stenosis with AT and PT artery occlusions and one vessel runoff via the peroneal artery. -03/21/2018 s/p PTA of 90% left popliteal artery to <10% with a 5x20 cutting balloon, PTA of 90% left peroneal to <20% with a 3x20 balloon, PTA of 100% left AT to <20% with a 2.5x220 balloon. 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 Will plan on the LLE soon after. Risks/benefits of procedure have been discussed and patient has elected to proceed. We have been using endoform to the right TMA amputation site wound and Santyl to the left great toe 10/07/2018; the area on the tip of his left great toe looked better we have been using Santyl here. We continue to have a very difficult probing hole on the right TMA amputation site we have been using endoform. I went on to use his fifth Oasis today 10/14/2018; the tip of the left great toe continues to look better. We have been using Santyl here the surface however is healthy and I think we can change to an alginate. The right TMA has not changed. Once again he has no superficial opening there is callus and thick subcutaneous tissue over the orifice once you remove this there is the probing area that we have been dealing with without too much change. There is no palpable bone I have been placing Oasis here and put Oasis #6 in this today after a more vigorous debridement 10/21/18; the left great toe still has necrotic surface requiring debridement. The right TMA site hasn't changed in view of the thick callus over the wound bed. With removal of this there is still the opening however this does not appear to have the same depth. Again there is no palpable bone. Oasis was replaced 10/28/2018; patient comes in with both wounds looking worse. The area over the first toe tip is  now down to bone. The area over the TMA site is deeper and down to bone clearly with a increase in overall wound area. Equally concerning on the right TMA is the complete absence of a pulse this week which is a change. We are not even able to Doppler this. On the right he has a noncompressible ABI greater than 1.4 11/04/2018. Both wounds look somewhat worse. X-rays showed no osteomyelitis of the right foot but on the left there was underlying osteomyelitis in the left great toe distal phalanx. I been on the phone to Dr. Andree Elk surface at [facility] in [place] and they are arranging for another angiogram on the right on Monday. I have him on doxycycline for the osteomyelitis in the left great toe for now. Infectious disease may be necessary 1/17; 2-week hiatus. Patient was admitted to hospital at [place]. My understanding is he underwent an angioplasty of  the right anterior tibial artery and had stents placed in the left anterior artery and the left tibial peroneal trunk. This was done by Dr. Andree Elk of interventional radiology. There is no major change in either 1 of the wounds. They have been using Aquacel Ag. As far as they are aware no imaging studies were done of the foot which is indeed unfortunate. I had him on doxycycline for 2 weeks since we identified the osteomyelitis in the left great toe by plain x-ray. I have renewed that again today. I am still suspicious about osteomyelitis in the amputation site and would consider doing another MRI to compare with the one done in September. The idea of hyperbaric oxygen certainly comes up for discussion 1/24; no major change in either wound area. I have him on doxycycline for osteomyelitis at the tip of the left great toe. As noted he has been previously and recently revascularized by Dr. Andree Elk at [place]. He is tolerating the doxycycline well. For some reason we do not have an infectious disease consult yet. Culture of drainage from the right foot site last  week was negative 1/31; MRI of the right foot did not show osteomyelitis of the right ankle and foot. Notable for a skin ulceration overlying the second metatarsal stump with generalizing soft tissue edema of the ankle and foot consistent with cellulitis. Noted to have a partial-thickness tear of the Achilles tendon 7.5 cm proximal to the insertion Nothing really new in terms of symptoms. Patient's area on the tip of the left great toe is just about closed although he still has the probing area on the metatarsal amputation site. Appointment with Dr. Linus Salmons of infectious disease next week 2/7; Dr. Novella Olive did not feel that any further antibiotics were necessary he would follow-up in 2 months. The left great toe appears to be closed still some surface callus that I gently looked under high did not see anything open or anything that was threatening to be open. He still has the open area on the mid part of his TMA site using endoform 2/14; left great toe is closed and he is completing his doxycycline as of last Sunday. He is not on any antibiotics. Unfortunately out of the right foot his wife noticed some subdermal hemorrhage this week. He had been walking 2 miles I had given him permission to do so this is not really a plantar wound. Using endoform to this wound but I changed to silver alginate this week 2/21; left great toe remains closed. Culture last week grew Streptococcus angiosis which I am not really familiar with however it is penicillin sensitive and I am going to put him on Augmentin. Not much change in the wound on the right foot the deep area is still probing precariously close to bone and the wound on the margin of the TMA is larger. 2/28; left great toe remains closed. He is completing the Augmentin I gave him last week. 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 This is being shown to Dr. Andree Elk at [place] to see if there is anything else that can be done  here. He has been using silver alginate strips on the right 3/6; left great toe remains closed. He sees Dr. Andree Elk on Monday. The area on the plantar aspect of the right foot has the same small orifice with thick callused tissue around this. However this time with removal of the callus tissue the wound is open all the way along the  incision line to the end medially. We have been using silver alginate. 3/13; left toe remains closed although the area is callused. He sees Dr. Jacqualyn Posey of podiatry next week. The area on the plantar right foot looked a lot better this week. Culture I did of this was negative we use silver alginate. He is going next week for an attempt at revascularization by Dr. Andree Elk 3/23; left toe remains closed although the area is callused. He will see Dr. Jacqualyn Posey in follow-up. The area on the right plantar foot continues to look surprisingly better over the last 3 visits. We have been using silver alginate. The revascularization he was supposed to have by Dr. Andree Elk at [facility] in [place] has been canceled [facility] procedure] 4/6; the right foot remains closed albeit callused. Podiatry canceled the appointment with regards to the left great toe. He has not seen Dr. Andree Elk at [facility] but thinks that Dr. Andree Elk has "done all he can do". He would be a candidate for the hemostaemix trial Readmission 07/29/2019 Mr. Stann Mainland is a man we know well from at least 3 previous stays in this clinic. He is a type II diabetic with severe PAD followed by Dr. Andree Elk at [facility] in [place]. 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 So far everything in both of these areas has remained closed. About 2 weeks ago he went to see his podiatrist [facility] Dr. Babs Bertin. He had a thick callus on the left fifth metatarsal head that was shaved. He has developed an open wound  in this area. They have been offloading this in his diabetic shoes. The patient has not had any more revascularizations by Dr. Andree Elk since the last time he was here. ABI in our clinic at the posterior tibial on the left was 1.06 10/6; no real change in the area on the plantar met head. Small wound with 2 mm of depth. He has thick skin probably from pressure around the wound. We have been using silver alginate 10/13; small wound in the left fifth plantar met head. Arrives today with undermining laterally and purulent drainage. Our intake nurse cultured this. His wife stated they noticed a change in color over the last day or 2. He is not systemically unwell 10/19; small wound on the fifth plantar metatarsal head. Culture I did last week showed Staphylococcus lugdunensis. Although this could be a skin contaminant the possibility of a skin and soft tissue infection was there. I did give him empiric doxycycline which should have covered this. We are using silver alginate to the wound. We put him in a total contact cast today. 10/22; small wound on the fifth plantar metatarsal head. He has completed antibiotics. He also has severe PAD which worries me about just about any wound on this man's foot 10/29; small superficial area on the fifth plantar metatarsal head. Measuring slightly smaller. He is not currently on any antibiotics. I been using a total contact cast in this man with very severe PAD but he seems to be tolerating this well 11/5; left plantar fifth metatarsal head. Silver alginate being used under a total contact cast 11/12; wound not much different than last week. Using a #15 scalpel debridement around the wound. Still silver collagen under a total contact cast. He has severe PAD and that may be playing a role in  this 11/19; disappointing that the wound is not really changed that much. I think it is come down in overall surface area because originally this was on the lateral part of the  fifth metatarsal head however recently it is not really changed. Most of this is filled in but it will not epithelialized there is surface debris on this which may be mostly related to ischemia. They have an appointment with Dr. Andree Elk on 12/9 09/24/2019 on evaluation today patient appears to be doing somewhat better with regard to the wound on the left fifth metatarsal head. Fortunately there does not appear to be any signs of active infection at this time. No fevers, chills, nausea, vomiting, or diarrhea. The wound does not appear to be completely closed and I do feel like the Hydrofera Blue is helping to some degree although I feel like the cast as well as keeping things from breaking down or getting any larger at least. As far as his wound overview it shows that the overall size of the wound is slightly smaller but really maintaining within the realm of about the same. He had no troubles with the cast which is good news. 12/1; left fifth metatarsal head. Perhaps somewhat more vibrant. We have been using Hydrofera Blue. He sees Dr. Andree Elk at [place] 1 week tomorrow. He be back next week and I hope to have a better idea which way the wound is going however I will not cast him next week in case Dr. Andree Elk wants to reevaluate things in his foot. The left leg is the leg with the stent in place and I wonder whether these are going to have to be reevaluated. 12/8; left fifth metatarsal head. Quite a bit better this week. Only a small open area remains. He sees Dr. Andree Elk at [place] tomorrow. Dr. Andree Elk is previously done his vascular interventions. He has 2 stents in the left leg as I remember things. I therefore will not put him in a total contact cast today. He will be using a forefoot off loader. We will use silver collagen on the wound. We will see him again next week 12/15; left fifth metatarsal head. He saw Dr. Andree Elk and is scheduled for an angiogram on Thursday. Unfortunately although his wound was a lot  better last week it is really deteriorated this week. Small punched-out hole with depth and overhanging tissue. We have been using Hydrofera Blue 12/22; patient had an 80% stenosis proximal to the stent I believe in the below-knee popliteal artery. This was opened by Dr. Andree Elk. According the patient the stent itself was satisfactory. I have not been able to review this note. 12/29; patient arrives today with the wound about the same size some undermining medially. We have been using Hydrofera Blue under a total contact cast and that is what we will do again today 11/04/2019. Slightly smaller in orifice but about 4 mm undermining almost circumferentially. We have been using Hydrofera Blue. Apparently the Hydrofera Blue did not stay on the wound after we removed the cast. Some minor looking cast irritation on the right lateral 1/12; unfortunately things did not go well this week. Arrives in clinic today with a deeper wound with undermining more concerning a area of pus. Specimen was obtained for culture. We are not going to be able to put him in a cast today. 1/19; purulent material last week which I cultured showed Enterobacter cloacae. He was on ampicillin previously prescribed by his primary doctor which should not have covered this  however mysteriously the wound looks somewhat better. There is less depth less erythema certainly no drainage. He will need a course of ciprofloxacin which I will provide today. 1/26; he has completed the ciprofloxacin. I don't think he needs any additional antibiotics. The areas on the left fifth plantar met head. We've been using silver alginate 2/2; comes in today with 0.4 cm of circumferential undermining around a small wound in this area. We have been using silver alginate with a forefoot offloading boot 2/9; again the wound appears larger nonviable surface and with 0.4 cm roughly of circumferential undermining. We have been using silver alginate with a forefoot  offloading boot. The wound does not look infected however his wife is quick to point out about swelling on the dorsal foot. 2/16; not much change. Comes in with a small wound with a nonviable necrotic surface. Again marked undermining that I had totally removed last week. He had a arterial Doppler last week in Dr. Andree Elk office at [facility]. They have not heard these results. They are somewhat frustrated. 2/23; if anything this is worse. Undermining increased depth. Nonviable surface that looks pale. According his wife is TBI on the right was 0.22 although I have not verified this. He is going for an angiogram with Dr. Andree Elk next Monday. We are using polymen and offloading in a forefoot offloading boot. 3/2; the patient was revascularized yesterday by [facility]. He had successful PTA of the left peroneal 60% to less than 20% and successful PTA of the left ATA 100% to less than 20%. He now has a pulse in the left foot Now that he has some additional blood flow there is not a lot of options here. On one hand we want activity to help keep blood flow augmented on the other hand pressure relief is going to be paramount if we have any chance to get this to close. After some discussion with his wife and patient I went ahead and put him in a total contact cast. 3/9; he arrives in clinic today with some debris over the wound surface. We use silver collagen under a total contact cast after his revascularization by Dr. Andree Elk 3/16; wound about the same depth at 0.5 cm. Surface area perhaps slightly less but it is the depth the really is important. We have been using silver collagen 3/23; not much change here. There is still undermining medially the depth is about the same tissue looks somewhat better. We switched to Iodoflex last week to try to get a better looking wound surface. He does have a small abrasion over the proximal interphalangeal joint dorsally. I this is so small it is  difficult even to tell whether it is open. Some abrasion between the fifth toe and the webspace as well 3/30; if anything the wound is deeper this week and larger. Very disappointing. Under illumination debris on the surface which I debrided gently with a #3 curette there is minimal bleeding. We had been using iodoflex under a total contact cast. I am going to take him out of the cast and change him to [place] today which his wife will change daily. 4/6; wound bed looks better. Still undermining medially. Some maceration and erythema around the wound. We have been using Santyl change daily. I took him out of a total contact cast last week 4/13; patient's culture from last time grew MSSA. The wound is larger not a very viable surface. Duplex ultrasound I did last week rule out DVT was  negative. He still has a fair amount of pitting edema in the lower leg and dorsal foot but I am not really certain why. I started him on Keflex 500 every 6 for 10 days starting yesterday His wife tells me that Dr. Andree Elk office in [place] has been in contact they want to look at restudying I believe to see if there is further procedures planned. I told his wife he still has a feeble dorsalis pedis pulse certainly not as robust as after the last procedure although this could be partially because of edema 4/20; he has completed his antibiotics 2 weeks worth of Keflex for MSSA. He has necrotic material over a large portion of this wound. The swelling in his leg is come down presumably resolved with treatment of the infection. He will have a arterial ultrasound by Dr. Andree Elk on Friday and apparently they see him later that day. We are using Bactroban and silver alginate 4/27 patient had his arterial Dopplers ABIs and TBI's done by Dr. Andree Elk. He was noted to have a moderate to significant stenosis involving the left proximal superficial artery consistent with a 50 to 69% diameter reduction. The left anterior tibial artery  including the dorsalis pedis was patent. There was no flow in the distal posterior tibial artery monophasic flow within the distal peroneal artery. The great toe index was 0.32 his absolute great toe pressure was 38 arterial flow was detected by PPG within all 5 digits. He is due to have angiography again on 5/13 Dr. Andree Elk put him back on 2 weeks worth of Keflex. The wound has made absolutely no progress. Interestingly it is more centered over the lateral part of his foot started more on the plantar aspect. Electronic Signature(s) Signed: 02/24/2020 5:41:23 PM By: [name] MD Entered By: [name] on 02/24/2020 13:27:49 -------------------------------------------------------------------------------- Physical Exam Details Patient Name: Date of Service: [name], [name] 02/24/2020 12:30 PM Medical Record [id number] Patient Account Number: [id number] Date of Birth/Sex: Treating RN: [birth year] (73 y.o. [name] Primary Care Provider: [name] Other Clinician: Referring Provider: Treating Provider/Extender:Esaw Knippel, [name], [name] in Treatment: 57 Constitutional Patient is hypertensive.. Pulse regular and within target range for patient.Marland Kitchen Respirations regular, non-labored and within target range.. Temperature is normal and within the target range for the patient.Marland Kitchen Appears in no distress. Cardiovascular Very faint dorsalis pedis pulse. Integumentary (Hair, Skin) No erythema around the wound. Notes Wound exam; the surface of this is really a lot worse. No debridement. Inferiorly there is exposed tendon I am not sure if there is any viable tissue underneath this to cover the bone. Electronic Signature(s) Signed: 02/24/2020 5:41:23 PM By: [name] MD Entered By: [name] on 02/24/2020 13:28:44 -------------------------------------------------------------------------------- Physician Orders Details Patient Name: Date of Service: [name], [name] 02/24/2020 12:30 PM Medical Record [id number] Patient Account Number: [id number] Date of Birth/Sex: Treating RN: Apr 14, 1946 (73 y.o. [name]) [name] Primary Care Provider: [name] Other Clinician: Referring Provider: Treating Provider/Extender:Jeryl Wilbourn, [name], [name] in Treatment: 73 Verbal / Phone Orders: No Diagnosis Coding ICD-10 Coding Code Description E11.621 Type 2 diabetes mellitus with foot ulcer E11.51 Type 2 diabetes mellitus with diabetic peripheral angiopathy without gangrene L97.521 Non-pressure chronic ulcer of other part of left foot limited to breakdown of skin Follow-up Appointments Return Appointment in 1 week. Dressing Change Frequency Wound #5 Left Metatarsal head fifth Change dressing every day. Wound Cleansing Wound #5 Left Metatarsal head fifth May shower and wash wound with soap and water. Primary Wound Dressing Wound #5  Left Metatarsal head fifth Calcium Alginate - over santyl Santyl Ointment Other: - pad right great toe for protection Secondary Dressing Foam - secure with tape Off-Loading Wedge shoe to: - with felt. apply to left foot. Additional Orders / Instructions Other: - closely monitor redness. Radiology MRI, lower extremity without contrast, left foot - increased pain, increased swelling, increased redness, non healing wound - (ICD10 E11.621 - Type 2 diabetes mellitus with foot ulcer) Electronic Signature(s) Signed: 02/24/2020 5:28:23 PM By: [name] RN Signed: 02/24/2020 5:41:23 PM By: [name] MD Entered By: [name] on 02/24/2020 13:24:13 -------------------------------------------------------------------------------- Prescription 02/24/2020 Patient Name: [name] Provider: [name] MD Date of Birth: Sep 06, 1946 NPI#: [id number] Sex: M DEA#: [id number] Phone #: [phone] License #: [id number] Patient Address: [address] [place], [place] [address] [place], [place] [address] [phone] Allergies Reaction Severity Name daptomycin pneumonia Severe Provider's Orders MRI, lower extremity without contrast, left foot - ICD10: E11.621 - increased pain, increased swelling, increased redness, non healing wound Hand Signature: Date(s): Electronic Signature(s) Signed: 02/24/2020 5:28:23 PM By: [name] RN Signed: 02/24/2020 5:41:23 PM By: [name] MD Entered By: [name] on 02/24/2020 13:24:14 --------------------------------------------------------------------------------  Problem List Details Patient Name: Date of Service: [name], [name] 02/24/2020 12:30 PM Medical Record [id number] Patient Account Number: [id number] Date of Birth/Sex: Treating RN: [birth year] (73 y.o. 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 with 02/24/2020 No Yes other specified severity Inactive Problems ICD-10 Code Description Active Date Inactive Date L03.116 Cellulitis of left lower limb 08/12/2019 08/12/2019 L97.521 Non-pressure chronic ulcer of other part of left foot limited to 07/29/2019 07/29/2019 breakdown of skin Resolved Problems Electronic Signature(s) Signed: 02/24/2020 5:41:23 PM By: [name] MD Entered By: [name] on 02/24/2020 13:25:17 -------------------------------------------------------------------------------- Progress Note Details Patient Name: Date of Service: [name], [name] 02/24/2020 12:30 PM Medical Record [id number] Patient  Account Number: [id number] Date of Birth/Sex: Treating RN: 1946/04/29 (73 y.o. [name] Primary Care Provider: [name] Other Clinician: Referring Provider: Treating Provider/Extender:Kaylie Ritter, [name], [name] in Treatment: 30 Subjective History of Present Illness (HPI) 05/18/16; this is a 74year-old diabetic who is a type II diabetic on insulin. The history is that he traumatized his right foot developed a sore sometime in late March. 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 According to the patient and his wife this wound never really healed. He was seen on 2 occasions in the wound care center in [place] and had vascular studies and then was referred urgently to Dr. Bridgett Larsson of vascular surgery. He underwent an angiogram on 05/10/16. Unfortunately nothing really could be done to improve his vascular status. He had a 75-90% stenosis in the midsegment of 1 segment of the posterior femoral artery. He had a patent popliteal, his anterior tibial occluded shortly after takeoff. Perineal had a greater than 90% stenosis posterior tibial is occluded feet had  no distal collaterals feed distal aspect of the transmetatarsal amputation site. The patient tells me that he had a prolonged period of Santyl by Dr. Doran Durand was some initial improvement but then this was stopped. I think they're only applying daily dressings/dry dressings. He has not had a recent x-ray of the right foot he did have one before his surgery in April. His wife by the dimensions of the wound/surgical site being followed at home since 4/20. At that point the dimensions were 0.5 x 12 x 0.2 on 7/19 this was 1.8 x 6 x 0.4. He is not currently on any antibiotics. His hemoglobin A1c in early April  was 12.9 at that point he was started on insulin. Apparently his blood sugars are much lower he has an appointment with Dr. Legrand Como Alteimer of endocrine next week. 05/29/16 x-ray of the area did not show osteomyelitis. I think he probably needs an MRI at this point. His wife is asking about something called"Yireh" cream which is not FDA approved. I have not heard of this. 06/22/16; MRI did not really suggest osteomyelitis. There was minimal marrow edema and enhancement in the stump of the second metatarsal felt to be secondary likely to postoperative change rather than osteomyelitis. The patient has arranged his own consultation with Dr. Andree Elk at [place], apparently their daughter lives in [place] and has some connection here. Any improvement in vascular supply by Dr. Andree Elk would of course be helpful. Dr. Bridgett Larsson did not feel that anything further could be done other than amputation if wound care did not result in healing or if the area deteriorates. 06/26/16; the patient has been to see Dr. Andree Elk at [facility] and had an angiogram. He is going for a procedure on Thursday which will involve catheterization. I'm not sure if this is an anterograde or retrograde approach. He has been using Santyl to the wound 07/10/16; the patient had a repeat angiogram and angioplasty at [place] by Dr. Brunetta Jeans. His angiogram showed right CFA and profundal widely patent. The right as of a.m. popliteal artery were widely patent the right anterior tibial was occluded proximally and reconstitutes at the ankle. Peroneal artery was patent to the foot. Posterior tibial artery was occluded. The patient had angioplasty of the anterior tibial artery.. This was quite successful. He was recommended for Plavix as well as aspirin. 07/17/16; the patient was close to be a nurse visit today however the outer dressing of the Apligraf fell off. Noted drainage. I was asked to see the wound. The patient is noted an odor however his wife had noted that.  Drainage with Apligraf not necessarily a bad thing. He has not been systemically unwell 07/24/16; we are still have an issue with drainage of this wound. In spite of this I applied his second Apligraf. Medially the area still is probing to bone. 08/07/16; Apligraf reapplied in general wound looks improved. 08/21/16 Apligraf #4. Wound looks much better 09/04/16 patientt's wound again today continues to appear to improve with the application of the Apligraf's. He notes no increased discomfort or concerns at this point in time. 09/18/16; the patient returns today 2 weeks after his fifth application of Apligraf. Predictably three quarters of the width of this wound has healed. The deep area that probe to bone medially is still open. The patient asked how much out-of-pocket dollars would be for additional Apligraf's. 09/25/16; now using Hydrofera Blue. He has completed 5 Apligraf applications with considerable improvement in this deep open transmetatarsal amputation site. His wound is now  a triangular-shaped wound on the medial aspect. At roughly 12 to 2:00 this probes another centimeter but as opposed to in the past this does not probe to bone. The patient has been seen at [facility] by Dr. Zenia Resides. He is not planning to do any more revascularization unless the wound stalls or worsens per the patient 10/02/16; 0.7 x 0.8 x 0.8. Unfortunately although the wound looks stable to improved. There is now easily probable bone. This hasn't been present for several weeks. Patient is not otherwise symptomatic he is not experiencing any pain. I did a culture of the wound bed 10/09/16. Deterioration last week. Culture grew MRSA and although there is improvement here with doxycycline prescribed over the phone I'm going to try to get him linezolid 600 twice a day for 10 days today. 10/16/16; he is completing a weeks worth of linezolid and still has 3 more days to go. Small triangular-shaped open area with some degree of  undermining. There is still palpable bone with a curet. Overall the area appears better than last week 10/20/16 he has completed the linezolid still having some nausea and vomiting but no diarrhea. He has exposed bone this week which is a deterioration. 10/27/16 patient now has a small but probing wound down to bone. Culture of this bone that I did last week showed a few methicillin-resistant staph aureus. I have little doubt that this represents acute/subacute osteomyelitis. The patient is currently on Doxy which I will continue he also completed 10 days of linezolid. We are now in a difficult situation with this patient's foot after considerable discussion we will send him back to see Dr. Doran Durand for a surgical opinion of this I'm also going to try to arrange a infectious disease consult at [facility] hopefully week and get this prior to her usual 4-6 weeks we having [place]. The patient clearly is going to need 6 weeks of IV vancomycin. If we cannot arrange this expediently I'll have to consider ordering this myself through a home infusion company 11/03/16; the patient now has a small in terms of circumference but probing wound. No bone palpable today. The patient remains on doxycycline 100 twice a day which should support him until he sees infectious disease at [facility] next week the following week on Wednesday I believe he has an appointment with Dr. Andree Elk at [facility] who is his vascular cardiologist. Finally he has an appointment with Dr. Doran Durand on 11/22/16 we have been using silver alginate. The patient's wife states they are having trouble getting this through [facility] 11/13/16; the patient was seen by infectious disease at [facility] in the 11th PICC line placed in preparation for IV antibiotics. A tummy he has not going to get IV vancomycin o Ceftaroline. They've also ordered an MRI. Patient has a follow-up with Dr. Doran Durand on 11/22/16 and Dr. Andree Elk at [facility] tomorrow 11/23/16 the patient is on  daptomycin as directed by infectious disease at [facility]. He is also been back to see Dr. Andree Elk at [facility]. He underwent a repeat arteriogram. He had a successful PTA of the right anterior tibial artery. He is on dual antiplatelete treatment with Plavix and aspirin. Finally he had the MRI of his foot in [place]. This showed cellulitis about the foot worse distally edema and enhancement in the reminiscent of the second metatarsal was consistent with osteomyelitis therefore what I was assuming to be the first metatarsal may be actually the second. He also has a fluid collection deep to the calcaneus at the  level of the calcaneal spur which could be an abscess or due to adventitial bursitis. He had a small tear in his Achilles 11/30/16; the patient continues on daptomycin as directed by infectious disease at [facility]. He is been revascularized by Dr. Andree Elk at [facility] in [place]. He has been to see Gretta Arab who was the orthopedic surgeon who did his original amputation. I have not seen his not however per the patient's wife he did not offer another surgical local surgical prodecure to remove involved bone. He verbalized his usual disbelief in not just hyperbarics but any medical therapy for this condition(osteomyelitis). I discussed this in detail with the patient today including answering the question about a BKA definitively "curing" the current condition. 12/07/16; the patient continues on daptomycin as directed by infectious disease at [facility]. This is directed at the MRSA that we cultured from his bone debridement from 12/22. Lab work today shows a white count of 8.7 hemoglobin of 10.5 which is microcytic and hypochromic differential count shows a slightly elevated monocyte count at 1.2 eosinophilic count of 0.6. His creatinine is 1.11 sedimentation rate apparently is gone from 35-34 now 40. I explained was wife I don't think this represents a trend. His total CK is 48 12/14/16- patient is  here for follow-up evaluation of his right TMA site. He continues to receive IV daptomycin per infectious disease. His serum inflammatory markers remain elevated. He complains of intermittent pain to the medial aspect of the TMA site with intermittent erythema. He voices no complaints or concerns regarding hyperbaric therapy. Overall he and his wife are expressing a frustration and discouragement regarrding the length of time of treatment. 12/21/16; small open wound at roughly the first or second metatarsal metatarsalphalyngeal joint reminiscence of his transmetatarsal amputation site he continues to receive IV daptomycin per infectious disease at [facility]. He will finish these a week tomorrow. He has lab work which I been copied on. His white count is 10.8 hemoglobin 8.9 MCV is low at 75., MCH low at 24.5 platelet count slightly elevated at 626. Differential count shows 70% neutrophils 10% monocytes and 10% eosinophils. His comprehensive metabolic panel shows a slightly low sodium at 133 albumin low at 3.1 total CK is normal at 42 sedimentation rate is much higher at 82. He has had iron studies that show a serum iron of 15 and iron binding capacity of 238 and iron saturation of 6. This is suggestive of iron deficiency. B12 and folate were normal ferritin at 113 The patient tells me that he is not eating well and he has lost weight. He feels episodically nauseated. He is coughing and gagging on mucus which she thinks is sinusitis. He has an appointment with his primary doctor at 5:00 this afternoon in [facility] 01/02/17; the patient developed a subacute pneumonitis. He was admitted to [facility] after a CT scan showed an extensive interstitial pneumonitis [I have not yet seen this]. 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 He has since been discharged. He is not on oxygen. He feels fatigued  and very short of breath with exertion. At [facility] the wound care nurse there felt that his wound was healed. He did complete his daptomycin and has follow-up with infectious disease on Friday. X-rays I did before he went to [facility] still suggested residual osteomyelitis in the anterior aspect of the second must metatarsal head. I'm not sure I would've expected any different.  There was no other findings. I actually think I did this because of erythema over the first metatarsal head reminiscent 01/11/17; the patient has eosinophilic pneumonitis. He has been reviewed by pulmonology and given clearance for hyperbarics at least that's what his wife says. I'll need to see if there is note in care everywhere. Apparently the prognosis for improvement of daptomycin induced eosinophilic granulocyte is is 3 months without steroids. In the meantime infectious disease has placed him on doxycycline until the wound is closed. He is still is lost a lot of weight and his blood sugars are running in the mid 60s to low 80s fasting and at all times during the day 01/18/17; he has had adjustments in his insulin apparently his blood sugars in the morning or over 100. He wants to restart his hyperbaric treatment we'll do this at 1:00. 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 I think there is good reason to complete his treatments in order to give him the best chance of maintaining a healed status and these DFU 3 wounds with MRSA infection in the bone 02/15/17; the patient was seen today in conjunction with HBO. He completed hyperbaric oxygen today. The open area on his transmetatarsal site has remained closed. There was an area of erythema when I saw him earlier in the week on the posterior heel although that is resolved as of today as well. He has been using a cam walker. This is a patient who came to [place] after a transmetatarsal  amputation that was necrotic and dehisced. He required revascularization percutaneously on 2 different occasions by Dr. Andree Elk of invasive cardiology at [facility]. 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 He went to [facility] infectious disease and had IV daptomycin for 5 weeks before developing eosinophilic pneumonitis and requiring an admission to hospital/ICU. He made a good recovery and is continued on doxycycline since. As mentioned his foot is closed now. He has a follow-up with Dr. Andree Elk tomorrow. He is going to [employer] on Monday for a custom-made shoe READMISSION Last visit Dr. Andree Elk [facility] 02/16/17 1. Critical limb ischemia of the RLE:  s/p transmetatarsal amputation of the RLE, now completely healed.  s/p right ATA percutaneous revascularization procedure x2, most recently 11/20/2016 s/p PTA of right AT 100% to less than 20% with a 2.5 x 200 balloon.  He does have some residual osteomyelitis, but given the wound is closed, the orthopedist has recommended to follow. He has a fitting for a special shoe coming up next week. He has completed a course of daptomycin and doxycycline and he has been discharged by ID. He has completed a course of hyperbaric therapy.  Continue Continue medical management with aspirin, Plavix and statin therapy. We will discuss ongoing Plavix therapy at follow-up in 6 months.  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 Given no symptoms, we will conservatively manage. He doesn't want to do any more invasive studies at this time, which is reasonable. The patient arrives today out of 2 concerns both on the right transmetatarsal site. 1 at the level of the reminiscent fifth metatarsal head and the other at roughly the first or  second. Both of these look like dark subcutaneous discoloration probably subdermal bleeding. He is recently  obtained new adaptive footwear for the right foot. He also has a callus on the left fifth dorsal toe however he follows with podiatry for this and I don't think this is any issue. ABIs in this clinic today were 0.66 on the right and 1.06 on the left. On entrance into our clinic initially this was 0.95 and 0.92. He does not describe current claudication. They're going away on a cruise in 8 weeks and I think are trying to do the month is much as they can proactively. They follow with podiatry and have an appointment with Dr. Andree Elk in October READMISSION 06/25/18 This is a patient that we have not seen in almost a year. He is a type II diabetic with known PAD. He is followed by Dr. Andree Elk of interventional cardiology at [facility] in [place]. He is required revascularization for significant PAD. When we first saw him he required a transmetatarsal amputation. He had underlying osteomyelitis with a nonhealing surgical wound. This eventually closed with wound care, IV antibiotics and hyperbaric oxygen. They tell me that he has a modified shoe and he is very active walking up to 4 miles a day. He is followed by Dr. Geroge Baseman of podiatry. His wife states that he underwent a removal of callus over this site on July 9. She felt there may be drainage from this site after that although she could never really determined and there was callus buildup again. On 06/01/18 there was pressure bleeding through the overlying callus. he was given a prescription for 7 days of Bactrim. Fortuitously he has an appointment with Dr. Andree Elk on 06/04/18 and he immediately underwent revascularization of the right leg although I have not had a chance to review these records in care everywhere. This was apparently done through anterior and retrograde access. On 06/06/18 he had another debridement by Dr. Bernette Mayers. Vitamin soaking with  Epsom salts for 20 minutes and applying calcium alginate. The original trans-met was on April 2017 I believe by Dr. Doran Durand. His ABI in our clinic was noncompressible today. 07/02/18; x-ray I ordered last week was negative for osteomyelitis. Swab culture was also negative. He is going to require an MRI which I have ordered today. 07/09/18; surprisingly the MRI of the foot that I ordered did not show osteomyelitis. He did suggest the possibility of cellulitis. For this reason I'll go ahead and give him a 10 day course of doxycycline. Although the previous culture of this area was negative 07/16/18; it arrives with the wound looking much the same. Roughly the same depth. He has thick subcutaneous tissue around the wound orifice but this still has roughly the same depth. Been using silver alginate. We applied Oasis #1 today 07/23/2018; still having to remove a lot of callus and thick subcutaneous tissue to actually define the wound here. Most of this seems to have closed down yet he has a comma shaped divot over the top of the area that still I think is open. We applied Oasis #2 His wife expressed concern about the tip of his left great toe. This almost looks like a small blister. She also showed it today to her podiatrist Dr. Geroge Baseman who did not think this was anything serious. I am not sure is anything serious either however I think it bears some watching. He is not in any pain however he is insensate 07/30/2018;; still on a lot of nonviable tissue over the surface of the wound however cleaning this up reveals a more substantial wound orifice but less  of a probing wound depth. This is not probed to bone. There is no evidence of infection The wife is still concerned about a non-open area on the tip of his left great toe. Almost feels like a bony outgrowth. She had previously showed this to podiatry. I do not think this is a blister. A friction area would be possible although he is really not walking  according to his wife. He had a small skin tag on his right buttock but no open wound here either 08/06/2018; we applied a third Oasis last week. Unfortunately there is really no improvement. Still requiring extensive debridement to expose the wound bed from a horizontal slitlike depression. I still have not been able to get this to fill in properly. On the positive side there is now no probable bone from when he first came into the facility. I changed him to silver alginate today after a reasonably aggressive debridement 08/13/2018; once again the patient comes in with skin and subcutaneous tissue closing over the small probing area with the underlying cavity of the wound on the right TMA site. I applied silver alginate to this last week. Prior to that we used Oasis x3 still not able to get this area granulating. He does not have a probing area of the bone which is an improvement from when I spur started working on this and an MRI did not suggest osteomyelitis. I do not see evidence of infection here but I am increasingly concerned about why I cannot get this area to granulate. Each time I debrided this is looks like this is simply a matter of getting granulation to fill in the hole and then getting epithelialization. This does not seem to happen Also I sent him back to see podiatry Dr. Earleen Newport about the what felt to be bony outgrowth on the tip of his left great toe. Apparently after heel he left the clinic last week or the next day he developed a blood blister. He did see Dr. Earleen Newport. He went on to have a debridement of the medial nail cuticle he now has an open area here as well as some denuded skin. They did an x-ray apparently does have a bony outgrowth or spur but I am not able to look at this. They are using topical antibiotics apparently there was some suggested he use Santyl. Patient's wife was anxious for my opinion of this 08/20/2018; we are able to keep the wound open this time instead  of the thick subcutaneous tissue closing over the top of it however unfortunately once again this probes to bone. I do not see any evidence of infection and previous MRI did not show osteomyelitis. I elected to go back to the Oasis to see if we can stimulate some granulation. Last saw his vascular interventional cardiologist Dr. Andree Elk at the beginning of August and he had a repeat procedure. Nevertheless I wonder how much blood flow he has down to this area With regards to the left first toe he is seeing Dr. Earleen Newport next week. They are applying Bactroban to this area. 08/27/2018 ooOnce again he comes in with thick eschar and subcutaneous tissue over the top of the small probing hole. This does not appear to go down to bone but it still has roughly the same depth. I reapplied Oasis today ooOver the left great toe there appears to be more of the wound at the tip of his toe than there was last week. This has an eschar on the surface of  it. Will change to Santyl. There is seeing podiatry this afternoon 09/03/2018 ooHe comes in today with the area on the transmetatarsal's site with a fair amount of callus, nonviable tissue over the circumference but it was not closed. I removed all of this as well as some subcutaneous debris and reapplied Oasis. There is no exposed bone ooThe area over the tip of the left great toe started off as a nodule of uncertain etiology. He has been followed with podiatry. They have been removing part of the medial nail bed. He has nonviable tissue over the wound he been using Santyl in this area 09/10/18 ooUnfortunately comes in with neither wound area looking improved. The transmetatarsal amputation site once Again has nonviable debris over the surface requiring debridement. Unfortunately underneath this there is nothing that looks viable and this once again goes right down to bone. There is no purulent drainage and no erythema. ooAlso the surgical wound from podiatry  on the left first toe has an ischemic-looking eschar over the surface of the tip of the toe I have elected not to attempt candidly debride this ooHis wife as arranged for him to have follow-up noninvasive studies in [place] under the care of Dr. Andree Elk clinic. The question is he has known severe PAD. They had recently seen him in August. He has had revascularizations in both legs within the last 4 or 5 months. He is not complaining of pain and I cannot really get a history of claudication. He has not systemically unwell 09/20/2018 the patient has been to [facility] ([place] and been revascularized by Dr. Andree Elk earlier this week. Apparently he was able to open up the anterior tibial artery although I have not actually seen his formal report. He is going for an attempt to revascularize on the left on Monday. He is apparently working with an investigational stent for lower extremity arteries below the knee and he has talked to the patient about placing that on Monday if possible. The patient has been using silver alginate on the transmetatarsal amputation site and Santyl on the left 09/30/2018; patient had his revascularization on the left this apparently included a standard approach as well as a more distal arterial catheterization although I do not have any information on this from Dr. Andree Elk. In fact I do not even see the initial revascularization that he had on the right. I have included the arterial history from Dr. Andree Elk last note however below; ASSESSMENT/PLAN: 1. Hx of Critical limb ischemia bilateral lower extremities, PAD: -s/p transmetatarsal amputation of the RLE. -s/p right ATA percutaneous revascularization procedure x2, most recently 11/20/2016 s/p PTA of right AT 100% to less than 20% with a 2.5 x 200 balloon. -On original angiogram 05/15/2016, he had a significant 70-95% left popliteal artery stenosis with AT and PT artery occlusions and one vessel runoff via the peroneal  artery. -03/21/2018 s/p PTA of 90% left popliteal artery to <10% with a 5x20 cutting balloon, PTA of 90% left peroneal to <20% with a 3x20 balloon, PTA of 100% left AT to <20% with a 2.5x220 balloon. 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 Will plan on the LLE soon after. Risks/benefits of procedure have been discussed and patient has elected to proceed. We have been using endoform to the right TMA amputation site wound and Santyl to the left great toe 10/07/2018; the area on  the tip of his left great toe looked better we have been using Santyl here. We continue to have a very difficult probing hole on the right TMA amputation site we have been using endoform. I went on to use his fifth Oasis today 10/14/2018; the tip of the left great toe continues to look better. We have been using Santyl here the surface however is healthy and I think we can change to an alginate. The right TMA has not changed. Once again he has no superficial opening there is callus and thick subcutaneous tissue over the orifice once you remove this there is the probing area that we have been dealing with without too much change. There is no palpable bone I have been placing Oasis here and put Oasis #6 in this today after a more vigorous debridement 10/21/18; the left great toe still has necrotic surface requiring debridement. The right TMA site hasn't changed in view of the thick callus over the wound bed. With removal of this there is still the opening however this does not appear to have the same depth. Again there is no palpable bone. Oasis was replaced 10/28/2018; patient comes in with both wounds looking worse. The area over the first toe tip is now down to bone. The area over the TMA site is deeper and down to bone clearly with a increase in overall wound area. Equally  concerning on the right TMA is the complete absence of a pulse this week which is a change. We are not even able to Doppler this. On the right he has a noncompressible ABI greater than 1.4 11/04/2018. Both wounds look somewhat worse. X-rays showed no osteomyelitis of the right foot but on the left there was underlying osteomyelitis in the left great toe distal phalanx. I been on the phone to Dr. Andree Elk surface at [facility] in [place] and they are arranging for another angiogram on the right on Monday. I have him on doxycycline for the osteomyelitis in the left great toe for now. Infectious disease may be necessary 1/17; 2-week hiatus. Patient was admitted to hospital at [place]. My understanding is he underwent an angioplasty of the right anterior tibial artery and had stents placed in the left anterior artery and the left tibial peroneal trunk. This was done by Dr. Andree Elk of interventional radiology. There is no major change in either 1 of the wounds. They have been using Aquacel Ag. As far as they are aware no imaging studies were done of the foot which is indeed unfortunate. I had him on doxycycline for 2 weeks since we identified the osteomyelitis in the left great toe by plain x-ray. I have renewed that again today. I am still suspicious about osteomyelitis in the amputation site and would consider doing another MRI to compare with the one done in September. The idea of hyperbaric oxygen certainly comes up for discussion 1/24; no major change in either wound area. I have him on doxycycline for osteomyelitis at the tip of the left great toe. As noted he has been previously and recently revascularized by Dr. Andree Elk at [place]. He is tolerating the doxycycline well. For some reason we do not have an infectious disease consult yet. Culture of drainage from the right foot site last week was negative 1/31; MRI of the right foot did not show osteomyelitis of the right ankle and foot. Notable for a skin  ulceration overlying the second metatarsal stump with generalizing soft tissue edema of the ankle and foot consistent with  cellulitis. Noted to have a partial-thickness tear of the Achilles tendon 7.5 cm proximal to the insertion Nothing really new in terms of symptoms. Patient's area on the tip of the left great toe is just about closed although he still has the probing area on the metatarsal amputation site. Appointment with Dr. Linus Salmons of infectious disease next week 2/7; Dr. Novella Olive did not feel that any further antibiotics were necessary he would follow-up in 2 months. The left great toe appears to be closed still some surface callus that I gently looked under high did not see anything open or anything that was threatening to be open. He still has the open area on the mid part of his TMA site using endoform 2/14; left great toe is closed and he is completing his doxycycline as of last Sunday. He is not on any antibiotics. Unfortunately out of the right foot his wife noticed some subdermal hemorrhage this week. He had been walking 2 miles I had given him permission to do so this is not really a plantar wound. Using endoform to this wound but I changed to silver alginate this week 2/21; left great toe remains closed. Culture last week grew Streptococcus angiosis which I am not really familiar with however it is penicillin sensitive and I am going to put him on Augmentin. Not much change in the wound on the right foot the deep area is still probing precariously close to bone and the wound on the margin of the TMA is larger. 2/28; left great toe remains closed. He is completing the Augmentin I gave him last week. 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 This is being shown to Dr. Andree Elk at [facility] to see if there is anything else that can be done here. He has been using silver alginate strips on the right 3/6; left great toe remains closed. He sees Dr. Andree Elk on  Monday. The area on the plantar aspect of the right foot has the same small orifice with thick callused tissue around this. However this time with removal of the callus tissue the wound is open all the way along the incision line to the end medially. We have been using silver alginate. 3/13; left toe remains closed although the area is callused. He sees Dr. Jacqualyn Posey of podiatry next week. The area on the plantar right foot looked a lot better this week. Culture I did of this was negative we use silver alginate. He is going next week for an attempt at revascularization by Dr. Andree Elk 3/23; left toe remains closed although the area is callused. He will see Dr. Jacqualyn Posey in follow-up. The area on the right plantar foot continues to look surprisingly better over the last 3 visits. We have been using silver alginate. The revascularization he was supposed to have by Dr. Andree Elk at [facility] in [place] has been canceled [facility] procedure] 4/6; the right foot remains closed albeit callused. Podiatry canceled the appointment with regards to the left great toe. He has not seen Dr. Andree Elk at [facility] but thinks that Dr. Andree Elk has "done all he can do". He would be a candidate for the hemostaemix trial Readmission 07/29/2019 Mr. Stann Mainland is a man we know well from at least 3 previous stays in this clinic. He is a type II diabetic with severe PAD followed by Dr. Andree Elk at [facility] in [place]. During his last stay here he had a probing wound bone in his right TMA site and a episode  of osteomyelitis on the tip of the left great toe at a surgical site. So far everything in both of these areas has remained closed. About 2 weeks ago he went to see his podiatrist at [facility] Dr. Babs Bertin. He had a thick callus on the left fifth metatarsal head that was shaved. He has developed an open wound in this area. They have been offloading this in his diabetic shoes. The patient has not had any more  revascularizations by Dr. Andree Elk since the last time he was here. ABI in our clinic at the posterior tibial on the left was 1.06 10/6; no real change in the area on the plantar met head. Small wound with 2 mm of depth. He has thick skin probably from pressure around the wound. We have been using silver alginate 10/13; small wound in the left fifth plantar met head. Arrives today with undermining laterally and purulent drainage. Our intake nurse cultured this. His wife stated they noticed a change in color over the last day or 2. He is not systemically unwell 10/19; small wound on the fifth plantar metatarsal head. Culture I did last week showed Staphylococcus lugdunensis. Although this could be a skin contaminant the possibility of a skin and soft tissue infection was there. I did give him empiric doxycycline which should have covered this. We are using silver alginate to the wound. We put him in a total contact cast today. 10/22; small wound on the fifth plantar metatarsal head. He has completed antibiotics. He also has severe PAD which worries me about just about any wound on this man's foot 10/29; small superficial area on the fifth plantar metatarsal head. Measuring slightly smaller. He is not currently on any antibiotics. I been using a total contact cast in this man with very severe PAD but he seems to be tolerating this well 11/5; left plantar fifth metatarsal head. Silver alginate being used under a total contact cast 11/12; wound not much different than last week. Using a #15 scalpel debridement around the wound. Still silver collagen under a total contact cast. He has severe PAD and that may be playing a role in this 11/19; disappointing that the wound is not really changed that much. I think it is come down in overall surface area because originally this was on the lateral part of the fifth metatarsal head however recently it is not really changed. Most of this is filled in but it will  not epithelialized there is surface debris on this which may be mostly related to ischemia. They have an appointment with Dr. Andree Elk on 12/9 09/24/2019 on evaluation today patient appears to be doing somewhat better with regard to the wound on the left fifth metatarsal head. Fortunately there does not appear to be any signs of active infection at this time. No fevers, chills, nausea, vomiting, or diarrhea. The wound does not appear to be completely closed and I do feel like the Hydrofera Blue is helping to some degree although I feel like the cast as well as keeping things from breaking down or getting any larger at least. As far as his wound overview it shows that the overall size of the wound is slightly smaller but really maintaining within the realm of about the same. He had no troubles with the cast which is good news. 12/1; left fifth metatarsal head. Perhaps somewhat more vibrant. We have been using Hydrofera Blue. He sees Dr. Andree Elk at [place] 1 week tomorrow. He be back next week and  I hope to have a better idea which way the wound is going however I will not cast him next week in case Dr. Andree Elk wants to reevaluate things in his foot. The left leg is the leg with the stent in place and I wonder whether these are going to have to be reevaluated. 12/8; left fifth metatarsal head. Quite a bit better this week. Only a small open area remains. He sees Dr. Andree Elk at [place] tomorrow. Dr. Andree Elk is previously done his vascular interventions. He has 2 stents in the left leg as I remember things. I therefore will not put him in a total contact cast today. He will be using a forefoot off loader. We will use silver collagen on the wound. We will see him again next week 12/15; left fifth metatarsal head. He saw Dr. Andree Elk and is scheduled for an angiogram on Thursday. Unfortunately although his wound was a lot better last week it is really deteriorated this week. Small punched-out hole with depth and overhanging  tissue. We have been using Hydrofera Blue 12/22; patient had an 80% stenosis proximal to the stent I believe in the below-knee popliteal artery. This was opened by Dr. Andree Elk. According the patient the stent itself was satisfactory. I have not been able to review this note. 12/29; patient arrives today with the wound about the same size some undermining medially. We have been using Hydrofera Blue under a total contact cast and that is what we will do again today 11/04/2019. Slightly smaller in orifice but about 4 mm undermining almost circumferentially. We have been using Hydrofera Blue. Apparently the Hydrofera Blue did not stay on the wound after we removed the cast. Some minor looking cast irritation on the right lateral 1/12; unfortunately things did not go well this week. Arrives in clinic today with a deeper wound with undermining more concerning a area of pus. Specimen was obtained for culture. We are not going to be able to put him in a cast today. 1/19; purulent material last week which I cultured showed Enterobacter cloacae. He was on ampicillin previously prescribed by his primary doctor which should not have covered this however mysteriously the wound looks somewhat better. There is less depth less erythema certainly no drainage. He will need a course of ciprofloxacin which I will provide today. 1/26; he has completed the ciprofloxacin. I don't think he needs any additional antibiotics. The areas on the left fifth plantar met head. We've been using silver alginate 2/2; comes in today with 0.4 cm of circumferential undermining around a small wound in this area. We have been using silver alginate with a forefoot offloading boot 2/9; again the wound appears larger nonviable surface and with 0.4 cm roughly of circumferential undermining. We have been using silver alginate with a forefoot offloading boot. The wound does not look infected however his wife is quick to point out about swelling  on the dorsal foot. 2/16; not much change. Comes in with a small wound with a nonviable necrotic surface. Again marked undermining that I had totally removed last week. He had a arterial Doppler last week in Dr. Andree Elk office at [facility]. They have not heard these results. They are somewhat frustrated. 2/23; if anything this is worse. Undermining increased depth. Nonviable surface that looks pale. According his wife is TBI on the right was 0.22 although I have not verified this. He is going for an angiogram with Dr. Andree Elk next Monday. We are using polymen and offloading in a forefoot  offloading boot. 3/2; the patient was revascularized yesterday by [facility]. He had successful PTA of the left peroneal 60% to less than 20% and successful PTA of the left ATA 100% to less than 20%. He now has a pulse in the left foot Now that he has some additional blood flow there is not a lot of options here. On one hand we want activity to help keep blood flow augmented on the other hand pressure relief is going to be paramount if we have any chance to get this to close. After some discussion with his wife and patient I went ahead and put him in a total contact cast. 3/9; he arrives in clinic today with some debris over the wound surface. We use silver collagen under a total contact cast after his revascularization by Dr. Andree Elk 3/16; wound about the same depth at 0.5 cm. Surface area perhaps slightly less but it is the depth the really is important. We have been using silver collagen 3/23; not much change here. There is still undermining medially the depth is about the same tissue looks somewhat better. We switched to Iodoflex last week to try to get a better looking wound surface. He does have a small abrasion over the proximal interphalangeal joint dorsally. I this is so small it is difficult even to tell whether it is open. Some abrasion between the fifth toe and the webspace as well 3/30;  if anything the wound is deeper this week and larger. Very disappointing. Under illumination debris on the surface which I debrided gently with a #3 curette there is minimal bleeding. We had been using iodoflex under a total contact cast. I am going to take him out of the cast and change him to [place] today which his wife will change daily. 4/6; wound bed looks better. Still undermining medially. Some maceration and erythema around the wound. We have been using Santyl change daily. I took him out of a total contact cast last week 4/13; patient's culture from last time grew MSSA. The wound is larger not a very viable surface. Duplex ultrasound I did last week rule out DVT was negative. He still has a fair amount of pitting edema in the lower leg and dorsal foot but I am not really certain why. I started him on Keflex 500 every 6 for 10 days starting yesterday His wife tells me that Dr. Andree Elk office in [place] has been in contact they want to look at restudying I believe to see if there is further procedures planned. I told his wife he still has a feeble dorsalis pedis pulse certainly not as robust as after the last procedure although this could be partially because of edema 4/20; he has completed his antibiotics 2 weeks worth of Keflex for MSSA. He has necrotic material over a large portion of this wound. The swelling in his leg is come down presumably resolved with treatment of the infection. He will have a arterial ultrasound by Dr. Andree Elk on Friday and apparently they see him later that day. We are using Bactroban and silver alginate 4/27 patient had his arterial Dopplers ABIs and TBI's done by Dr. Andree Elk. He was noted to have a moderate to significant stenosis involving the left proximal superficial artery consistent with a 50 to 69% diameter reduction. The left anterior tibial artery including the dorsalis pedis was patent. There was no flow in the distal posterior tibial artery monophasic flow  within the distal peroneal artery. The great toe index  was 0.32 his absolute great toe pressure was 38 arterial flow was detected by PPG within all 5 digits. He is due to have angiography again on 5/13 Dr. Andree Elk put him back on 2 weeks worth of Keflex. The wound has made absolutely no progress. Interestingly it is more centered over the lateral part of his foot started more on the plantar aspect. Objective Constitutional Patient is hypertensive.. Pulse regular and within target range for patient.Marland Kitchen Respirations regular, non-labored and within target range.. Temperature is normal and within the target range for the patient.Marland Kitchen Appears in no distress. Vitals Time Taken: 12:37 PM, Height: 69 in, Weight: 210 lbs, BMI: 31, Temperature: 98.1 F, Pulse: 75 bpm, Respiratory Rate: 18 breaths/min, Blood Pressure: 144/98 mmHg, Capillary Blood Glucose: 105 mg/dl. Cardiovascular Very faint dorsalis pedis pulse. General Notes: Wound exam; the surface of this is really a lot worse. No debridement. Inferiorly there is exposed tendon I am not sure if there is any viable tissue underneath this to cover the bone. Integumentary (Hair, Skin) No erythema around the wound. Wound #5 status is Open. Original cause of wound was Gradually Appeared. The wound is located on the Left Metatarsal head fifth. The wound measures 1.9cm length x 1.5cm width x 0.3cm depth; 2.238cm^2 area and 0.672cm^3 volume. There is tendon and Fat Layer (Subcutaneous Tissue) Exposed exposed. There is no tunneling or undermining noted. There is a medium amount of serosanguineous drainage noted. The wound margin is well defined and not attached to the wound base. There is small (1-33%) pink, pale granulation within the wound bed. There is a large (67-100%) amount of necrotic tissue within the wound bed including Adherent Slough. 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 Dressing Change Frequency: Wound #5 Left Metatarsal head fifth: Change dressing every day. Wound Cleansing: Wound #5 Left Metatarsal head fifth: May shower and wash wound with soap and water. Primary Wound Dressing: Wound #5 Left Metatarsal head fifth: Calcium Alginate - over santyl Santyl Ointment Other: - pad right great toe for protection Secondary Dressing: Foam - secure with tape Off-Loading: Wedge shoe to: - with felt. apply to left foot. Additional Orders / Instructions: Other: - closely monitor redness. Radiology ordered were: MRI, lower extremity without contrast, left foot - increased pain, increased swelling, increased redness, non healing wound 1. Santyl covered with calcium alginate 2. I have ordered an unenhanced MRI to look at the left fifth metatarsal head question osteomyelitis. If he has osteomyelitis he might be a candidate for hyperbarics. We did this previously for his right foot 3. Next angiogram on 5/13 by Dr. Andree Elk at [facility] 4. We put him back in a flat healing sandal. We had him in a forefoot off loader. The wound is now more on the left lateral than the plantar foot Electronic Signature(s) Signed: 02/24/2020 5:41:23 PM By: [name] MD Entered By: [name] on 02/24/2020 13:31:40 -------------------------------------------------------------------------------- SuperBill Details Patient Name: Date of Service: [name], [name] 02/24/2020 Medical Record [id number] Patient Account Number: [id number] Date of Birth/Sex: Treating RN: Jul 15, 1946 (73 y.o. [name] Primary Care Provider: [name] Other Clinician: Referring Provider: Treating Provider/Extender:Maebel Marasco, [name], [name] in Treatment: 30 Diagnosis Coding ICD-10  Codes Code Description E11.621 Type 2 diabetes mellitus with foot ulcer E11.51 Type 2 diabetes mellitus with diabetic peripheral  angiopathy without gangrene L97.528 Non-pressure chronic ulcer of other part of left foot with other specified severity Physician Procedures CPT4 Code Description: [id number] - WC PHYS LEVEL 3 - EST PT ICD-10 Diagnosis Description E11.621 Type 2 diabetes mellitus with foot ulcer E11.51 Type 2 diabetes mellitus with diabetic peripheral angiopathy L97.528 Non-pressure chronic ulcer of other  part of left foot with o Modifier: without gangren ther specified s Quantity: 1 e Company secretary) Signed: 02/24/2020 5:41:23 PM By: [name] MD Entered By: [name] on 02/24/2020 13:32:01

## 2020-02-24 NOTE — Progress Notes (Signed)
[name], [name] ([id number]) Visit Report for 02/24/2020 Arrival Information Details Patient Name: Date of Service: [name], [name] 02/24/2020 12:30 PM Medical Record [id number] Patient Account Number: [id number] Date of Birth/Sex: Treating RN: 1946-10-19 (74 y.o. [name]) [name] Primary Care Wille Aubuchon: [name] Other Clinician: Referring Arian Murley: Treating Wanetta Funderburke/Extender:[name], [name], [name] in Treatment: 80 Visit Information History Since Last Visit All ordered tests and consults were completed: Yes Patient Arrived: Ambulatory Added or deleted any medications: No Arrival Time: 12:35 Any new allergies or adverse reactions: No Accompanied By: wife Had a fall or experienced change in No Transfer Assistance: None activities of daily living that may affect Patient Identification Verified: Yes risk of falls: Secondary Verification Process Completed: Yes Signs or symptoms of abuse/neglect since last No Patient Requires Transmission-Based No visito Precautions: Hospitalized since last visit: No Patient Has Alerts: No Implantable device outside of the clinic No excluding cellular tissue based products placed in the center since last visit: Has Dressing in Place as Prescribed: Yes Has Compression in Place as Prescribed: Yes Has Footwear/Offloading in Place as Yes Prescribed: Left: Wedge Shoe Pain Present Now: No Notes Seen Dr. Andree Elk on Friday 02/20/2020. Ultrasound copy provided to MD. Per wife Dr. Andree Elk placed on angiogram scheduled for 03/11/2020. Electronic Signature(s) Signed: 02/24/2020 5:27:53 PM By: Deon Pilling Entered By: Deon Pilling on 02/24/2020 12:45:34 -------------------------------------------------------------------------------- Encounter Discharge Information Details Patient Name: Date of Service: [name], [name] 02/24/2020 12:30 PM Medical Record [id number] Patient Account Number: [id number] Date of Birth/Sex: Treating  RN: 08-18-1946 (74 y.o. [name] Primary Care Earnesteen Birnie: [name] Other Clinician: Referring Christoph Copelan: Treating Tyrees Chopin/Extender:[name], [name], [name] in Treatment: 30 Encounter Discharge Information Items Discharge Condition: Stable Ambulatory Status: Ambulatory Discharge Destination: Home Transportation: Private Auto Accompanied By: wife Schedule Follow-up Appointment: Yes Clinical Summary of Care: Patient Declined Electronic Signature(s) Signed: 02/24/2020 5:26:58 PM By: Kela Millin Entered By: Kela Millin on 02/24/2020 13:36:02 -------------------------------------------------------------------------------- Lower Extremity Assessment Details Patient Name: Date of Service: [name], [name] 02/24/2020 12:30 PM Medical Record [id number] Patient Account Number: [id number] Date of Birth/Sex: Treating RN: [birth year] (74 y.o. [name]) [name] Primary Care Mikiah Demond: [name] Other Clinician: Referring Ryane Canavan: Treating Rosaire Cueto/Extender:[name], [name], [name] in Treatment: 30 Edema Assessment Assessed: [Left: Yes] [Right: No] Edema: [Left: Ye] [Right: s] Calf Left: Right: Point of Measurement: cm From Medial Instep 39 cm cm Ankle Left: Right: Point of Measurement: cm From Medial Instep 29 cm cm Electronic Signature(s) Signed: 02/24/2020 5:27:53 PM By: Deon Pilling Signed: 02/24/2020 5:28:23 PM By: [name] RN Entered By: Deon Pilling on 02/24/2020 12:46:16 -------------------------------------------------------------------------------- Multi Wound Chart Details Patient Name: Date of Service: [name], [name] 02/24/2020 12:30 PM Medical Record [id number] Patient Account Number: [id number] Date of Birth/Sex: Treating RN: 05-06-1946 (74 y.o. 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] Comorbid History: [5:Cataracts, Chronic sinus N/A problems/congestion, Coronary Artery Disease, Hypertension, Peripheral Arterial Disease, Type II Diabetes, Osteoarthritis, Neuropathy] Date Acquired: [5:07/14/2019] [N/A:N/A] Weeks of Treatment: [5:30] [N/A:N/A] Wound Status: [5:Open] [N/A:N/A] Measurements L x W x D 1.9x1.5x0.3 [N/A:N/A] (cm) Area (cm) : [5:2.238] [N/A:N/A] Volume (cm) : [5:0.672] [N/A:N/A] % Reduction in Area: [5:-3452.40%] [N/A:N/A] % Reduction in  Volume: -5069.20% [N/A:N/A] Classification: [5:Grade 2] [N/A:N/A] Exudate Amount: [5:Medium] [N/A:N/A] Exudate Type: [5:Serosanguineous] [N/A:N/A] Exudate Color: [5:red, brown] [N/A:N/A] Wound Margin: [5:Well defined, not attached] [N/A:N/A] Granulation Amount: [5:Small (1-33%)] [N/A:N/A] Granulation Quality: [5:Pink, Pale] [N/A:N/A] Necrotic Amount: [5:Large (67-100%)] [N/A:N/A] Exposed Structures: [5:Fat Layer (Subcutaneous Tissue) Exposed: Yes Tendon: Yes Fascia: No Muscle: No Joint: No Bone: No None] [N/A:N/A N/A] Treatment Notes Electronic Signature(s) Signed: 02/24/2020 5:28:23 PM By: [name] RN Signed: 02/24/2020 5:41:23 PM By: Baltazar Najjar MD Entered By: Baltazar Najjar on 02/24/2020 13:25:26 -------------------------------------------------------------------------------- Multi-Disciplinary Care Plan Details Patient Name: Date of Service: [name], [name] 02/24/2020 12:30 PM Medical Record [id number] Patient Account Number: [id number] Date of Birth/Sex: Treating RN: 07-23-1946  (74 y.o. [name] Primary Care Sennie Borden: [name] Other Clinician: Referring Willis Kuipers: Treating Naveyah Iacovelli/Extender:[name], [name], [name] in Treatment: 30 Active Inactive Wound/Skin Impairment Nursing Diagnoses: Knowledge deficit related to ulceration/compromised skin integrity Goals: Patient/caregiver will verbalize understanding of skin care regimen Date Initiated: 07/29/2019 Target Resolution Date: 03/26/2020 Goal Status: Active Ulcer/skin breakdown will have a volume reduction of 30% by week 4 Date Initiated: 07/29/2019 Date Inactivated: 10/21/2019 Target Resolution Date: 10/10/2019 Unmet Reason: comorbities. Goal Status: Unmet blood flow Ulcer/skin breakdown will have a volume reduction of 50% by week 8 Date Initiated: 10/21/2019 Date Inactivated: 12/02/2019 Target Resolution Date: 11/21/2019 Goal Status: Unmet Unmet Reason: comorbities Ulcer/skin breakdown will have a volume reduction of 80% by week 12 Date Initiated: 12/02/2019 Date Inactivated: 01/06/2020 Target Resolution Date: 01/02/2020 Goal Status: Unmet Unmet Reason: comorbities Interventions: Assess patient/caregiver ability to obtain necessary supplies Assess patient/caregiver ability to perform ulcer/skin care regimen upon admission and as needed Assess ulceration(s) every visit Notes: Electronic Signature(s) Signed: 02/24/2020 5:28:23 PM By: [name] RN Entered By: [name] on 02/24/2020 13:12:56 -------------------------------------------------------------------------------- Pain Assessment Details Patient Name: Date of Service: [name], [name] 02/24/2020 12:30 PM Medical Record [id number] Patient Account Number: [id number] Date of Birth/Sex: Treating RN: 10-26-1946 (73 y.o. [name] Primary Care Masayoshi Couzens: [name] Other Clinician: Referring Jabree Pernice: Treating Kapono Luhn/Extender:[name], [name], [name] in Treatment: 30 Active Problems Location of Pain  Severity and Description of Pain Patient Has Paino No Site Locations Rate the pain. Current Pain Level: 0 Pain Management and Medication Current Pain Management: Medication: No Cold Application: No Rest: No Massage: No Activity: No T.E.N.S.: No Heat Application: No Leg drop or elevation: No Is the Current Pain Management Adequate: Adequate How does your wound impact your activities of daily livingo Sleep: No Bathing: No Appetite: No Relationship With Others: No Bladder Continence: No Emotions: No Bowel Continence: No Work: No Toileting: No Drive: No Dressing: No Hobbies: No Electronic Signature(s) Signed: 02/24/2020 5:27:53 PM By: Shawn Stall Signed: 02/24/2020 5:28:23 PM By: [name] RN Entered By: Shawn Stall on 02/24/2020 12:46:05 -------------------------------------------------------------------------------- Patient/Caregiver Education Details Patient Name: Date of Service: [name], [name] 4/27/2021andnbsp12:30 PM Medical Record [id number] Patient Account Number: [id number] Date of Birth/Gender: 1946/05/18 (74 y.o. M) Treating RN: [name] Primary Care Physician: [name] Other Clinician: Referring Physician: Treating Physician/Extender:[name], [name], [name] in Treatment: 30 Education Assessment Education Provided To: Patient Education Topics Provided Wound/Skin Impairment: Methods: Explain/Verbal Responses: State content correctly Electronic Signature(s) Signed: 02/24/2020 5:28:23 PM By: [name] RN Entered By: [name] on 02/24/2020 13:13:16 -------------------------------------------------------------------------------- Wound Assessment Details Patient Name: Date of Service: [name], [name] 02/24/2020 12:30 PM Medical Record [id number] Patient Account Number: [id number] Date of Birth/Sex: Treating RN: 1946/03/03 (73 y.o. [name] Primary Care Donell Tomkins: [name] Other Clinician: Referring  Jovian Lembcke: Treating  Mitsuo Budnick/Extender:[name], [name], Caryn Bee Weeks in Treatment: 30 Wound Status Wound Number: 5 Primary Diabetic Wound/Ulcer of the Lower Extremity Wound Location: Left Metatarsal head fifth Etiology: Wounding Event: Gradually Appeared Wound Open Date Acquired: 07/14/2019 Status: Weeks Of Treatment:30 ComorbidCataracts, Chronic sinus problems/congestion, Clustered Wound: No History: Coronary Artery Disease, Hypertension, Peripheral Arterial Disease, Type II Diabetes, Osteoarthritis, Neuropathy Wound Measurements Length: (cm) 1.9 Width: (cm) 1.5 Depth: (cm) 0.3 Area: (cm) 2.238 Volume: (cm) 0.672 Wound Description Classification: Grade 2 Wound Margin: Well defined, not attached Exudate Amount: Medium Exudate Type: Serosanguineous Exudate Color: red, brown Wound Bed Granulation Amount: Small (1-33%) Granulation Quality: Pink, Pale Necrotic Amount: Large (67-100%) Necrotic Quality: Adherent Slough After Cleansing: No rino Yes Exposed Structure osed: No (Subcutaneous Tissue) Exposed: Yes osed: Yes osed: No sed: No ed: No % Reduction in Area: -3452.4% % Reduction in Volume: -5069.2% Epithelialization: None Tunneling: No Undermining: No Foul Odor Slough/Fib Fascia Exp Fat Layer Tendon Exp Muscle Exp Joint Expo Bone Expos Treatment Notes Wound #5 (Left Metatarsal head fifth) 1. Cleanse With Wound Cleanser 2. Periwound Care Skin Prep 3. Primary Dressing Applied Calcium Alginate Santyl 4. Secondary Dressing Dry Gauze Foam 5. Secured With Secretary/administrator) Signed: 02/24/2020 5:27:53 PM By: Shawn Stall Signed: 02/24/2020 5:28:23 PM By: [name] RN Entered By: Shawn Stall on 02/24/2020 12:46:51 -------------------------------------------------------------------------------- Vitals Details Patient Name: Date of Service: [name], [name] 02/24/2020 12:30 PM Medical Record [id number] Patient Account Number:  [id number] Date of Birth/Sex: Treating RN: 1946/08/07 (73 y.o. [name] [name] Primary Care Tamakia Porto: [name] Other Clinician: Referring Alayjah Boehringer: Treating Jasma Seevers/Extender:[name], [name], [name] in Treatment: 30 Vital Signs Time Taken: 12:37 Temperature (F): 98.1 Height (in): 69 Pulse (bpm): 75 Weight (lbs): 210 Respiratory Rate (breaths/min): 18 Body Mass Index (BMI): 31 Blood Pressure (mmHg): 144/98 Capillary Blood Glucose (mg/dl): 865 Reference Range: 80 - 120 mg / dl Electronic Signature(s) Signed: 02/24/2020 5:27:53 PM By: Shawn Stall Entered By: Shawn Stall on 02/24/2020 12:45:54

## 2020-02-27 ENCOUNTER — Other Ambulatory Visit (HOSPITAL_COMMUNITY): Payer: Self-pay | Admitting: Internal Medicine

## 2020-02-27 ENCOUNTER — Other Ambulatory Visit: Payer: Self-pay | Admitting: Internal Medicine

## 2020-02-27 DIAGNOSIS — E11620 Type 2 diabetes mellitus with diabetic dermatitis: Secondary | ICD-10-CM

## 2020-03-02 ENCOUNTER — Encounter (HOSPITAL_BASED_OUTPATIENT_CLINIC_OR_DEPARTMENT_OTHER): Payer: Medicare Other | Attending: Internal Medicine | Admitting: Internal Medicine

## 2020-03-02 DIAGNOSIS — E11621 Type 2 diabetes mellitus with foot ulcer: Secondary | ICD-10-CM | POA: Insufficient documentation

## 2020-03-02 DIAGNOSIS — I10 Essential (primary) hypertension: Secondary | ICD-10-CM | POA: Insufficient documentation

## 2020-03-02 DIAGNOSIS — Z794 Long term (current) use of insulin: Secondary | ICD-10-CM | POA: Diagnosis not present

## 2020-03-02 DIAGNOSIS — L97528 Non-pressure chronic ulcer of other part of left foot with other specified severity: Secondary | ICD-10-CM | POA: Insufficient documentation

## 2020-03-02 DIAGNOSIS — M1990 Unspecified osteoarthritis, unspecified site: Secondary | ICD-10-CM | POA: Insufficient documentation

## 2020-03-02 DIAGNOSIS — I2510 Atherosclerotic heart disease of native coronary artery without angina pectoris: Secondary | ICD-10-CM | POA: Diagnosis not present

## 2020-03-02 DIAGNOSIS — E1151 Type 2 diabetes mellitus with diabetic peripheral angiopathy without gangrene: Secondary | ICD-10-CM | POA: Diagnosis not present

## 2020-03-03 NOTE — Progress Notes (Signed)
CARLIN, ATTRIDGE ([id number]) Visit Report for 03/02/2020 Arrival Information Details Patient Name: Date of Service: [name] 03/02/2020 12:30 PM Medical Record Number: [id number] Patient Account Number: [id number] Date of Birth/Sex: Treating RN: 08-13-1946 (74 y.o. [name], [name] Primary Care Merie Wulf: [name] Other Clinician: Referring Katleen Carraway: Treating Toryn Dewalt/Extender: [name] in Treatment: 31 Visit Information History Since Last Visit Added or deleted any medications: No Patient Arrived: Ambulatory Any new allergies or adverse reactions: No Arrival Time: 12:35 Had a fall or experienced change in No Accompanied By: self activities of daily living that may affect Transfer Assistance: None risk of falls: Patient Identification Verified: Yes Signs or symptoms of abuse/neglect since last visito No Secondary Verification Process Completed: Yes Hospitalized since last visit: No Patient Requires Transmission-Based Precautions: No Implantable device outside of the clinic excluding No Patient Has Alerts: No cellular tissue based products placed in the center since last visit: Has Dressing in Place as Prescribed: Yes Pain Present Now: No Electronic Signature(s) Signed: 03/02/2020 5:30:50 PM By: [name] RN, BSN Entered By: [name] on 03/02/2020 12:38:45 -------------------------------------------------------------------------------- Encounter Discharge Information Details Patient Name: Date of Service: [name], [name] 03/02/2020 12:30 PM Medical Record Number: [id number] Patient Account Number: [id number] Date of Birth/Sex: Treating RN: 07-03-1946 (73 y.o. [name] Primary Care Shiven Junious: [name] Other Clinician: Referring Arleatha Philipps: Treating Gresham Caetano/Extender: [name] in Treatment: 31 Encounter Discharge Information Items Post Procedure Vitals Discharge Condition:  Stable Temperature (F): 98.3 Ambulatory Status: Ambulatory Pulse (bpm): 81 Discharge Destination: Home Respiratory Rate (breaths/min): 18 Transportation: Private Auto Blood Pressure (mmHg): 119/66 Accompanied By: wife Schedule Follow-up Appointment: Yes Clinical Summary of Care: Patient Declined Electronic Signature(s) Signed: 03/02/2020 5:21:30 PM By: Cherylin Mylar Entered By: Cherylin Mylar on 03/02/2020 13:21:54 -------------------------------------------------------------------------------- Lower Extremity Assessment Details Patient Name: Date of Service: [name] Guzman 03/02/2020 12:30 PM Medical Record Number: [id number] Patient Account Number: [id number] Date of Birth/Sex: Treating RN: [birth year] (74 y.o. [name] Primary Care Jamesmichael Shadd: [name] Other Clinician: Referring Amarian Botero: Treating Sair Faulcon/Extender: [name] in Treatment: 31 Edema Assessment Assessed: [name]: No] [name]: No] Edema: [Left: Guzman] [Guzman: s] Calf Left: Guzman: Point of Measurement: cm From Medial Instep 40 cm cm Ankle Left: Guzman: Point of Measurement: cm From Medial Instep 28.5 cm cm Vascular Assessment Pulses: Dorsalis Pedis Palpable: [Left:Yes] Electronic Signature(s) Signed: 03/02/2020 5:30:50 PM By: [name] RN, BSN Entered By: [name] on 03/02/2020 12:45:05 -------------------------------------------------------------------------------- Multi Wound Chart Details Patient Name: Date of Service: [name], [name] 03/02/2020 12:30 PM Medical Record Number: [id number] Patient Account Number: [id number] Date of Birth/Sex: Treating RN: [birth year] (73 y.o. [name]) [name] Primary Care Dyan Creelman: [name] Other Clinician: Referring Delecia Vastine: Treating Sheretha Shadd/Extender: [name] in Treatment: 31 Vital Signs Height(in): 69 Capillary Blood Glucose(mg/dl): 314 Weight(lbs): 970 Pulse(bpm): 81 Body  Mass Index(BMI): 31 Blood Pressure(mmHg): 119/66 Temperature(F): 98.3 Respiratory Rate(breaths/min): 18 Photos: [5:No Photos Left Metatarsal head fifth] [N/A:N/A N/A] Wound Location: [5:Gradually Appeared] [N/A:N/A] Wounding Event: [5:Diabetic Wound/Ulcer of the Lower] [N/A:N/A] Primary Etiology: [5:Extremity Cataracts, Chronic sinus] [N/A:N/A] Comorbid History: [5:problems/congestion, Coronary Artery Disease, Hypertension, Peripheral Arterial Disease, Type II Diabetes, Osteoarthritis, Neuropathy 07/14/2019] [N/A:N/A] Date Acquired: [5:31] [N/A:N/A] Weeks of Treatment: [5:Open] [N/A:N/A] Wound Status: [5:1.9x1.5x0.4] [N/A:N/A] Measurements L x W x D (cm) [5:2.238] [N/A:N/A] A (cm) : rea [5:0.895] [N/A:N/A] Volume (cm) : [5:-3452.40%] [N/A:N/A] % Reduction in A rea: [5:-6784.60%] [N/A:N/A] % Reduction in Volume: [5:2] Starting Position 1 (o'clock): [  5:4] Ending Position 1 (o'clock): [5:0.6] Maximum Distance 1 (cm): [5:Yes] [N/A:N/A] Undermining: [5:Grade 2] [N/A:N/A] Classification: [5:Medium] [N/A:N/A] Exudate A mount: [5:Serosanguineous] [N/A:N/A] Exudate Type: [5:red, brown] [N/A:N/A] Exudate Color: [5:Well defined, not attached] [N/A:N/A] Wound Margin: [5:None Present (0%)] [N/A:N/A] Granulation A mount: [5:Large (67-100%)] [N/A:N/A] Necrotic A mount: [5:Fat Layer (Subcutaneous Tissue)] [N/A:N/A] Exposed Structures: [5:Exposed: Yes Tendon: Yes Bone: Yes Fascia: No Muscle: No Joint: No None] [N/A:N/A] Epithelialization: [5:Debridement - Excisional] [N/A:N/A] Debridement: Pre-procedure Verification/Time Out 13:09 [N/A:N/A] Taken: [5:Lidocaine 5% topical ointment] [N/A:N/A] Pain Control: [5:Subcutaneous, Slough] [N/A:N/A] Tissue Debrided: [5:Skin/Subcutaneous Tissue] [N/A:N/A] Guzman: [5:2.85] [N/A:N/A] Debridement A (sq cm): [5:rea Curette] [N/A:N/A] Instrument: [5:Moderate] [N/A:N/A] Bleeding: [5:Pressure] [N/A:N/A] Hemostasis A chieved: [5:0] [N/A:N/A] Procedural Pain:  [5:0] [N/A:N/A] Post Procedural Pain: [5:Procedure was tolerated well] [N/A:N/A] Debridement Treatment Response: [5:1.9x1.5x0.4] [N/A:N/A] Post Debridement Measurements L x W x D (cm) [5:0.895] [N/A:N/A] Post Debridement Volume: (cm) [5:Debridement] [N/A:N/A] Treatment Notes Electronic Signature(s) Signed: 03/02/2020 5:25:34 PM By: [name] MD Signed: 03/03/2020 4:37:47 PM By: [name] RN Entered By: [name] on 03/02/2020 13:19:53 -------------------------------------------------------------------------------- Multi-Disciplinary Care Plan Details Patient Name: Date of Service: [name], [name] 03/02/2020 12:30 PM Medical Record Number: [id number] Patient Account Number: [id number] Date of Birth/Sex: Treating RN: August 18, 1946 (73 y.o. [name] Primary Care Chanin Frumkin: [name] Other Clinician: Referring Yizel Canby: Treating Keara Pagliarulo/Extender: Leonette Nutting in Treatment: 31 Active Inactive Wound/Skin Impairment Nursing Diagnoses: Knowledge deficit related to ulceration/compromised skin integrity Goals: Patient/caregiver will verbalize understanding of skin care regimen Date Initiated: 07/29/2019 Target Resolution Date: 03/26/2020 Goal Status: Active Ulcer/skin breakdown will have a volume reduction of 30% by week 4 Date Initiated: 07/29/2019 Date Inactivated: 10/21/2019 Target Resolution Date: 10/10/2019 Goal Status: Unmet Unmet Reason: comorbities. blood flow Ulcer/skin breakdown will have a volume reduction of 50% by week 8 Date Initiated: 10/21/2019 Date Inactivated: 12/02/2019 Target Resolution Date: 11/21/2019 Goal Status: Unmet Unmet Reason: comorbities Ulcer/skin breakdown will have a volume reduction of 80% by week 12 Date Initiated: 12/02/2019 Date Inactivated: 01/06/2020 Target Resolution Date: 01/02/2020 Goal Status: Unmet Unmet Reason: comorbities Interventions: Assess patient/caregiver ability to obtain necessary  supplies Assess patient/caregiver ability to perform ulcer/skin care regimen upon admission and as needed Assess ulceration(s) every visit Notes: Electronic Signature(s) Signed: 03/03/2020 4:37:47 PM By: [name] RN Entered By: [name] on 03/02/2020 12:42:07 -------------------------------------------------------------------------------- Pain Assessment Details Patient Name: Date of Service: [name] Guzman 03/02/2020 12:30 PM Medical Record Number: [id number] Patient Account Number: [id number] Date of Birth/Sex: Treating RN: 05-Mar-1946 (74 y.o. [name] Primary Care Christe Tellez: [name] Other Clinician: Referring Jarrette Dehner: Treating Dione Petron/Extender: Leonette Nutting in Treatment: 31 Active Problems Location of Pain Severity and Description of Pain Patient Has Paino No Site Locations Rate the pain. Current Pain Guzman: 0 Pain Management and Medication Current Pain Management: Electronic Signature(s) Signed: 03/02/2020 5:30:50 PM By: Baruch Gouty RN, BSN Entered By: Baruch Gouty on 03/02/2020 12:44:14 -------------------------------------------------------------------------------- Patient/Caregiver Education Details Patient Name: Date of Service: [name] Guzman 5/4/2021andnbsp12:30 PM Medical Record Number: [id number] Patient Account Number: [id number] Date of Birth/Gender: Treating RN: 05/18/1946 (73 y.o. [name]) [name] Primary Care Physician: [name] Other Clinician: Referring Physician: Treating Physician/Extender: Leonette Nutting in Treatment: 31 Education Assessment Education Provided To: Patient Education Topics Provided Wound/Skin Impairment: Methods: Explain/Verbal Responses: State content correctly Electronic Signature(s) Signed: 03/03/2020 4:37:47 PM By: [name] RN Entered By: [name] on 03/02/2020  12:43:04 -------------------------------------------------------------------------------- Wound Assessment Details Patient Name: Date of Service: [name],  [name] 03/02/2020 12:30 PM Medical Record Number: [id number] Patient Account Number: [id number] Date of Birth/Sex: Treating RN: July 08, 1946 (74 y.o. [name] Primary Care Chidinma Clites: [name] Other Clinician: Referring Andalyn Heckstall: Treating Joss Friedel/Extender: [name] in Treatment: 31 Wound Status Wound Number: 5 Primary Diabetic Wound/Ulcer of the Lower Extremity Etiology: Wound Location: Left Metatarsal head fifth Wound Open Wounding Event: Gradually Appeared Status: Date Acquired: 07/14/2019 Comorbid Cataracts, Chronic sinus problems/congestion, Coronary Artery Weeks Of Treatment: 31 History: Disease, Hypertension, Peripheral Arterial Disease, Type II Clustered Wound: No Diabetes, Osteoarthritis, Neuropathy Photos Photo Uploaded By: Benjaman Kindler on 03/03/2020 08:37:43 Wound Measurements Length: (cm) 1.9 Width: (cm) 1.5 Depth: (cm) 0.4 Area: (cm) 2.238 Volume: (cm) 0.895 % Reduction in Area: -3452.4% % Reduction in Volume: -6784.6% Epithelialization: None Tunneling: No Undermining: Yes Starting Position (o'clock): 2 Ending Position (o'clock): 4 Maximum Distance: (cm) 0.6 Wound Description Classification: Grade 2 Wound Margin: Well defined, not attached Exudate Amount: Medium Exudate Type: Serosanguineous Exudate Color: red, brown Foul Odor After Cleansing: No Slough/Fibrino Yes Wound Bed Granulation Amount: None Present (0%) Exposed Structure Necrotic Amount: Large (67-100%) Fascia Exposed: No Necrotic Quality: Adherent Slough Fat Layer (Subcutaneous Tissue) Exposed: Yes Tendon Exposed: Yes Muscle Exposed: No Joint Exposed: No Bone Exposed: Yes Treatment Notes Wound #5 (Left Metatarsal head fifth) 1. Cleanse With Wound Cleanser 2. Periwound Care Skin Prep 3.  Primary Dressing Applied Calcium Alginate Santyl 4. Secondary Dressing Dry Gauze Foam 5. Secured With Secretary/administrator) Signed: 03/02/2020 5:30:50 PM By: [name] RN, BSN Entered By: [name] on 03/02/2020 12:45:55 -------------------------------------------------------------------------------- Vitals Details Patient Name: Date of Service: [name], [name] 03/02/2020 12:30 PM Medical Record Number: [id number] Patient Account Number: [id number] Date of Birth/Sex: Treating RN: [birth year] (74 y.o. [name] Primary Care Mamie Hundertmark: [name] Other Clinician: Referring Aleeta Schmaltz: Treating Tamas Suen/Extender: [name] in Treatment: 31 Vital Signs Time Taken: 12:38 Temperature (F): 98.3 Height (in): 69 Pulse (bpm): 81 Source: Stated Respiratory Rate (breaths/min): 18 Weight (lbs): 210 Blood Pressure (mmHg): 119/66 Source: Stated Capillary Blood Glucose (mg/dl): 124 Body Mass Index (BMI): 31 Reference Range: 80 - 120 mg / dl Notes glucose per pt report this am Electronic Signature(s) Signed: 03/02/2020 5:30:50 PM By: [name] RN, BSN Entered By: [name] on 03/02/2020 12:39:37

## 2020-03-03 NOTE — Progress Notes (Signed)
DANDY, LAZARO ([id number]) Visit Report for 03/02/2020 Debridement Details Patient Name: Date of Service: Lynnda Shields 03/02/2020 12:30 PM Medical Record Number: [id number] Patient Account Number: [id number] Date of Birth/Sex: Treating RN: Sep 28, 1946 (73 y.o. [name]) [name] Primary Care Provider: [name] Other Clinician: Referring Provider: Treating Provider/Extender: Leonette Nutting in Treatment: 31 Debridement Performed for Assessment: Wound #5 Left Metatarsal head fifth Performed By: Physician Ricard Dillon., MD Debridement Type: Debridement Severity of Tissue Pre Debridement: Fat layer exposed Level of Consciousness (Pre-procedure): Awake and Alert Pre-procedure Verification/Time Out Yes - 13:09 Taken: Start Time: 13:09 Pain Control: Lidocaine 5% topical ointment T Area Debrided (L x W): otal 1.9 (cm) x 1.5 (cm) = 2.85 (cm) Tissue and other material debrided: Viable, Non-Viable, Slough, Subcutaneous, Skin: Dermis , Skin: Epidermis, Slough Level: Skin/Subcutaneous Tissue Debridement Description: Excisional Instrument: Curette Bleeding: Moderate Hemostasis Achieved: Pressure End Time: 13:11 Procedural Pain: 0 Post Procedural Pain: 0 Response to Treatment: Procedure was tolerated well Level of Consciousness (Post- Awake and Alert procedure): Post Debridement Measurements of Total Wound Length: (cm) 1.9 Width: (cm) 1.5 Depth: (cm) 0.4 Volume: (cm) 0.895 Character of Wound/Ulcer Post Debridement: Improved Severity of Tissue Post Debridement: Fat layer exposed Post Procedure Diagnosis Same as Pre-procedure Electronic Signature(s) Signed: 03/02/2020 5:25:34 PM By: [name] MD Signed: 03/03/2020 4:37:47 PM By: [name] RN Entered By: [name] on 03/02/2020 13:21:02 -------------------------------------------------------------------------------- HPI Details Patient Name: Date of Service: Ileana Roup, Carola Frost YE 03/02/2020 12:30  PM Medical Record Number: [id number] Patient Account Number: [id number] Date of Birth/Sex: Treating RN: 06-Aug-1946 (73 y.o. [name] Primary Care Provider: [name] Other Clinician: Referring Provider: Treating Provider/Extender: Leonette Nutting in Treatment: 31 History of Present Illness HPI Description: 05/18/16; this is a 74year-old diabetic who is a type II diabetic on insulin. The history is that he traumatized his right foot developed a sore sometime in late March. Shortly thereafter he went on a cruise but he had to get off the cruise ship in [place] and fly urgently back to [place] where he was admitted to [facility] and ultimately underwent a transmetatarsal amputation by Dr. 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 According to the patient and his wife this wound never really healed. He was seen on 2 occasions in the wound care center in [place] and had vascular studies and then was referred urgently to Dr. Bridgett Larsson of vascular surgery. He underwent an angiogram on 05/10/16. Unfortunately nothing really could be done to improve his vascular status. He had a 75-90% stenosis in the midsegment of 1 segment of the posterior femoral artery. He had a patent popliteal, his anterior tibial occluded shortly after takeoff. Perineal had a greater than 90% stenosis posterior tibial is occluded feet had no distal collaterals feed distal aspect of the transmetatarsal amputation site. The patient tells me that he had a prolonged period of Santyl by Dr. [place] Durand was some initial improvement but then this was stopped. I think they're only applying daily dressings/dry dressings. He has not had a recent x-ray of the right foot he did have one before his surgery in April. His wife by the dimensions of the wound/surgical site being followed at home since 4/20. At that point the dimensions were 0.5 x 12 x 0.2 on 7/19 this was 1.8 x 6 x 0.4. He is not currently on any  antibiotics. His hemoglobin A1c in early April was 12.9 at that point he was started on insulin. Apparently his  blood sugars are much lower he has an appointment with Dr. Legrand Como Alteimer of endocrine next week. 05/29/16 x-ray of the area did not show osteomyelitis. I think he probably needs an MRI at this point. His wife is asking about something called"Yireh" cream which is not FDA approved. I have not heard of this. 06/22/16; MRI did not really suggest osteomyelitis. There was minimal marrow edema and enhancement in the stump of the second metatarsal felt to be secondary likely to postoperative change rather than osteomyelitis. The patient has arranged his own consultation with Dr. Andree Elk at [place], apparently their daughter lives in [place] and has some connection here. Any improvement in vascular supply by Dr. Andree Elk would of course be helpful. Dr. Bridgett Larsson did not feel that anything further could be done other than amputation if wound care did not result in healing or if the area deteriorates. 06/26/16; the patient has been to see Dr. Andree Elk at [place] and had an angiogram. He is going for a procedure on Thursday which will involve catheterization. I'm not sure if this is an anterograde or retrograde approach. He has been using Santyl to the wound 07/10/16; the patient had a repeat angiogram and angioplasty at [facility] by Dr. Brunetta Jeans. His angiogram showed right CFA and profundal widely patent. The right as of a.m. popliteal artery were widely patent the right anterior tibial was occluded proximally and reconstitutes at the ankle. Peroneal artery was patent to the foot. Posterior tibial artery was occluded. The patient had angioplasty of the anterior tibial artery.. This was quite successful. He was recommended for Plavix as well as aspirin. 07/17/16; the patient was close to be a nurse visit today however the outer dressing of the Apligraf fell off. Noted drainage. I was asked to see the wound. The patient is  noted an odor however his wife had noted that. Drainage with Apligraf not necessarily a bad thing. He has not been systemically unwell 07/24/16; we are still have an issue with drainage of this wound. In spite of this I applied his second Apligraf. Medially the area still is probing to bone. 08/07/16; Apligraf reapplied in general wound looks improved. 08/21/16 Apligraf #4. Wound looks much better 09/04/16 patientt's wound again today continues to appear to improve with the application of the Apligraf's. He notes no increased discomfort or concerns at this point in time. 09/18/16; the patient returns today 2 weeks after his fifth application of Apligraf. Predictably three quarters of the width of this wound has healed. The deep area that probe to bone medially is still open. The patient asked how much out-of-pocket dollars would be for additional Apligraf's. 09/25/16; now using Hydrofera Blue. He has completed 5 Apligraf applications with considerable improvement in this deep open transmetatarsal amputation site. His wound is now a triangular-shaped wound on the medial aspect. At roughly 12 to 2:00 this probes another centimeter but as opposed to in the past this does not probe to bone. The patient has been seen at [facility] by Dr. Zenia Resides. He is not planning to do any more revascularization unless the wound stalls or worsens per the patient 10/02/16; 0.7 x 0.8 x 0.8. Unfortunately although the wound looks stable to improved. There is now easily probable bone. This hasn't been present for several weeks. Patient is not otherwise symptomatic he is not experiencing any pain. I did a culture of the wound bed 10/09/16. Deterioration last week. Culture grew MRSA and although there is improvement here with doxycycline prescribed over the phone I'm going  to try to get him linezolid 600 twice a day for 10 days today. 10/16/16; he is completing a weeks worth of linezolid and still has 3 more days to go. Small  triangular-shaped open area with some degree of undermining. There is still palpable bone with a curet. Overall the area appears better than last week 10/20/16 he has completed the linezolid still having some nausea and vomiting but no diarrhea. He has exposed bone this week which is a deterioration. 10/27/16 patient now has a small but probing wound down to bone. Culture of this bone that I did last week showed a few methicillin-resistant staph aureus. I have little doubt that this represents acute/subacute osteomyelitis. The patient is currently on Doxy which I will continue he also completed 10 days of linezolid. We are now in a difficult situation with this patient's foot after considerable discussion we will send him back to see Dr. [place] Durand for a surgical opinion of this I'm also going to try to arrange a infectious disease consult at [facility] hopefully week and get this prior to her usual 4-6 weeks we having [place]. The patient clearly is going to need 6 weeks of IV vancomycin. If we cannot arrange this expediently I'll have to consider ordering this myself through a home infusion company 11/03/16; the patient now has a small in terms of circumference but probing wound. No bone palpable today. The patient remains on doxycycline 100 twice a day which should support him until he sees infectious disease at [facility] next week the following week on Wednesday I believe he has an appointment with Dr. Andree Elk at [facility] who is his vascular cardiologist. Finally he has an appointment with Dr. [place] Durand on 11/22/16 we have been using silver alginate. The patient's wife states they are having trouble getting this through [facility] 11/13/16; the patient was seen by infectious disease at [facility] in the 11th PICC line placed in preparation for IV antibiotics. A tummy he has not going to get IV vancomycin o Ceftaroline. They've also ordered an MRI. Patient has a follow-up with Dr. [place] Durand on 11/22/16 and Dr. Andree Elk at [facility] tomorrow 11/23/16 the patient is on daptomycin as directed by infectious disease at [facility]. He is also been back to see Dr. Andree Elk at [facility]. He underwent a repeat arteriogram. He had a successful PTA of the right anterior tibial artery. He is on dual antiplatelete treatment with Plavix and aspirin. Finally he had the MRI of his foot in [place]. This showed cellulitis about the foot worse distally edema and enhancement in the reminiscent of the second metatarsal was consistent with osteomyelitis therefore what I was assuming to be the first metatarsal may be actually the second. He also has a fluid collection deep to the calcaneus at the level of the calcaneal spur which could be an abscess or due to adventitial bursitis. He had a small tear in his Achilles 11/30/16; the patient continues on daptomycin as directed by infectious disease at [facility]. He is been revascularized by Dr. Andree Elk at [facility] in [place]. He has been to see Gretta Arab who was the orthopedic surgeon who did his original amputation. I have not seen his not however per the patient's wife he did not offer another surgical local surgical prodecure to remove involved bone. He verbalized his usual disbelief in not just hyperbarics but any medical therapy for this condition(osteomyelitis). I discussed this in detail with the patient today including answering the question about a BKA definitively "curing" the  current condition. 12/07/16; the patient continues on daptomycin as directed by infectious disease at [facility]. This is directed at the MRSA that we cultured from his bone debridement from 12/22. Lab work today shows a white count of 8.7 hemoglobin of 10.5 which is microcytic and hypochromic differential count shows a slightly elevated monocyte count at 1.2 eosinophilic count of 0.6. His creatinine is 1.11 sedimentation rate apparently is gone from 35-34 now 40. I explained was wife I don't think this represents a  trend. His total CK is 48 12/14/16- patient is here for follow-up evaluation of his right TMA site. He continues to receive IV daptomycin per infectious disease. His serum inflammatory markers remain elevated. He complains of intermittent pain to the medial aspect of the TMA site with intermittent erythema. He voices no complaints or concerns regarding hyperbaric therapy. Overall he and his wife are expressing a frustration and discouragement regarrding the length of time of treatment. 12/21/16; small open wound at roughly the first or second metatarsal metatarsalphalyngeal joint reminiscence of his transmetatarsal amputation site he continues to receive IV daptomycin per infectious disease at [facility]. He will finish these a week tomorrow. He has lab work which I been copied on. His white count is 10.8 hemoglobin 8.9 MCV is low at 75., MCH low at 24.5 platelet count slightly elevated at 626. Differential count shows 70% neutrophils 10% monocytes and 10% eosinophils. His comprehensive metabolic panel shows a slightly low sodium at 133 albumin low at 3.1 total CK is normal at 42 sedimentation rate is much higher at 82. He has had iron studies that show a serum iron of 15 and iron binding capacity of 238 and iron saturation of 6. This is suggestive of iron deficiency. B12 and folate were normal ferritin at 113 The patient tells me that he is not eating well and he has lost weight. He feels episodically nauseated. He is coughing and gagging on mucus which she thinks is sinusitis. He has an appointment with his primary doctor at 5:00 this afternoon in [facility] 01/02/17; the patient developed a subacute pneumonitis. He was admitted to [facility] after a CT scan showed an extensive interstitial pneumonitis [I have not yet seen this]. 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 He has since been  discharged. He is not on oxygen. He feels fatigued and very short of breath with exertion. At [facility] the wound care nurse there felt that his wound was healed. He did complete his daptomycin and has follow-up with infectious disease on Friday. X-rays I did before he went to [facility] still suggested residual osteomyelitis in the anterior aspect of the second must metatarsal head. I'm not sure I would've expected any different. There was no other findings. I actually think I did this because of erythema over the first metatarsal head reminiscent 01/11/17; the patient has eosinophilic pneumonitis. He has been reviewed by pulmonology and given clearance for hyperbarics at least that's what his wife says. I'll need to see if there is note in care everywhere. Apparently the prognosis for improvement of daptomycin induced eosinophilic granulocyte is is 3 months without steroids. In the meantime infectious disease has placed him on doxycycline until the wound is closed. He is still is lost a lot of weight and his blood sugars are running in the mid 60s to low 80s fasting and at all times during the day 01/18/17; he has had adjustments in his insulin  apparently his blood sugars in the morning or over 100. He wants to restart his hyperbaric treatment we'll do this at 1:00. 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 I think there is good reason to complete his treatments in order to give him the best chance of maintaining a healed status and these DFU 3 wounds with MRSA infection in the bone 02/15/17; the patient was seen today in conjunction with HBO. He completed hyperbaric oxygen today. The open area on his transmetatarsal site has remained closed. There was an area of erythema when I saw him earlier in the week on the posterior heel although that is resolved as of today as well. He has been using a cam walker. This is a  patient who came to [place] after a transmetatarsal amputation that was necrotic and dehisced. He required revascularization percutaneously on 2 different occasions by Dr. Andree Elk of invasive cardiology at [facility]. 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 He went to [facility] infectious disease and had IV daptomycin for 5 weeks before developing eosinophilic pneumonitis and requiring an admission to hospital/ICU. He made a good recovery and is continued on doxycycline since. As mentioned his foot is closed now. He has a follow-up with Dr. Andree Elk tomorrow. He is going to [employer] on Monday for a custom-made shoe READMISSION Last visit Dr. Andree Elk [facility] 02/16/17 1. Critical limb ischemia of the RLE: s/p transmetatarsal amputation of the RLE, now completely healed. s/p right ATA percutaneous revascularization procedure x2, most recently 11/20/2016 s/p PTA of right AT 100% to less than 20% with a 2.5 x 200 balloon. He does have some residual osteomyelitis, but given the wound is closed, the orthopedist has recommended to follow. He has a fitting for a special shoe coming up next week. He has completed a course of daptomycin and doxycycline and he has been discharged by ID. He has completed a course of hyperbaric therapy. Continue Continue medical management with aspirin, Plavix and statin therapy. We will discuss ongoing Plavix therapy at follow-up in 6 months. 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 Given no symptoms, we will conservatively manage. He doesn't want to do any more invasive studies at this time, which is reasonable. The patient arrives today out of 2 concerns both on the right transmetatarsal site. 1 at the level of the reminiscent fifth metatarsal head and the other  at roughly the first or second. Both of these look like dark subcutaneous discoloration probably subdermal bleeding. He is recently obtained new adaptive footwear for the right foot. He also has a callus on the left fifth dorsal toe however he follows with podiatry for this and I don't think this is any issue. ABIs in this clinic today were 0.66 on the right and 1.06 on the left. On entrance into our clinic initially this was 0.95 and 0.92. He does not describe current claudication. They're going away on a cruise in 8 weeks and I think are trying to do the month is much as they can proactively. They follow with podiatry and have an appointment with Dr. Andree Elk in October READMISSION 06/25/18 This is a patient that we have not seen in almost a year. He is a type II diabetic with known PAD. He is  followed by Dr. Andree Elk of interventional cardiology at [facility] in [place]. He is required revascularization for significant PAD. When we first saw him he required a transmetatarsal amputation. He had underlying osteomyelitis with a nonhealing surgical wound. This eventually closed with wound care, IV antibiotics and hyperbaric oxygen. They tell me that he has a modified shoe and he is very active walking up to 4 miles a day. He is followed by Dr. Geroge Baseman of podiatry. His wife states that he underwent a removal of callus over this site on July 9. She felt there may be drainage from this site after that although she could never really determined and there was callus buildup again. On 06/01/18 there was pressure bleeding through the overlying callus. he was given a prescription for 7 days of Bactrim. Fortuitously he has an appointment with Dr. Andree Elk on 06/04/18 and he immediately underwent revascularization of the right leg although I have not had a chance to review these records in care everywhere. This was apparently done through anterior and retrograde access. On 06/06/18 he had another debridement by Dr. Bernette Mayers.  Vitamin soaking with Epsom salts for 20 minutes and applying calcium alginate. The original trans-met was on April 2017 I believe by Dr. [place] Durand. His ABI in our clinic was noncompressible today. 07/02/18; x-ray I ordered last week was negative for osteomyelitis. Swab culture was also negative. He is going to require an MRI which I have ordered today. 07/09/18; surprisingly the MRI of the foot that I ordered did not show osteomyelitis. He did suggest the possibility of cellulitis. For this reason I'll go ahead and give him a 10 day course of doxycycline. Although the previous culture of this area was negative 07/16/18; it arrives with the wound looking much the same. Roughly the same depth. He has thick subcutaneous tissue around the wound orifice but this still has roughly the same depth. Been using silver alginate. We applied Oasis #1 today 07/23/2018; still having to remove a lot of callus and thick subcutaneous tissue to actually define the wound here. Most of this seems to have closed down yet he has a comma shaped divot over the top of the area that still I think is open. We applied Oasis #2 His wife expressed concern about the tip of his left great toe. This almost looks like a small blister. She also showed it today to her podiatrist Dr. Geroge Baseman who did not think this was anything serious. I am not sure is anything serious either however I think it bears some watching. He is not in any pain however he is insensate 07/30/2018;; still on a lot of nonviable tissue over the surface of the wound however cleaning this up reveals a more substantial wound orifice but less of a probing wound depth. This is not probed to bone. There is no evidence of infection The wife is still concerned about a non-open area on the tip of his left great toe. Almost feels like a bony outgrowth. She had previously showed this to podiatry. I do not think this is a blister. A friction area would be possible although he is really  not walking according to his wife. He had a small skin tag on his right buttock but no open wound here either 08/06/2018; we applied a third Oasis last week. Unfortunately there is really no improvement. Still requiring extensive debridement to expose the wound bed from a horizontal slitlike depression. I still have not been able to get this to fill in properly.  On the positive side there is now no probable bone from when he first came into the facility. I changed him to silver alginate today after a reasonably aggressive debridement 08/13/2018; once again the patient comes in with skin and subcutaneous tissue closing over the small probing area with the underlying cavity of the wound on the right TMA site. I applied silver alginate to this last week. Prior to that we used Oasis x3 still not able to get this area granulating. He does not have a probing area of the bone which is an improvement from when I spur started working on this and an MRI did not suggest osteomyelitis. I do not see evidence of infection here but I am increasingly concerned about why I cannot get this area to granulate. Each time I debrided this is looks like this is simply a matter of getting granulation to fill in the hole and then getting epithelialization. This does not seem to happen Also I sent him back to see podiatry Dr. Earleen Newport about the what felt to be bony outgrowth on the tip of his left great toe. Apparently after heel he left the clinic last week or the next day he developed a blood blister. He did see Dr. Earleen Newport. He went on to have a debridement of the medial nail cuticle he now has an open area here as well as some denuded skin. They did an x-ray apparently does have a bony outgrowth or spur but I am not able to look at this. They are using topical antibiotics apparently there was some suggested he use Santyl. Patient's wife was anxious for my opinion of this 08/20/2018; we are able to keep the wound open this time  instead of the thick subcutaneous tissue closing over the top of it however unfortunately once again this probes to bone. I do not see any evidence of infection and previous MRI did not show osteomyelitis. I elected to go back to the Oasis to see if we can stimulate some granulation. Last saw his vascular interventional cardiologist Dr. Andree Elk at the beginning of August and he had a repeat procedure. Nevertheless I wonder how much blood flow he has down to this area With regards to the left first toe he is seeing Dr. Earleen Newport next week. They are applying Bactroban to this area. 08/27/2018 Once again he comes in with thick eschar and subcutaneous tissue over the top of the small probing hole. This does not appear to go down to bone but it still has roughly the same depth. I reapplied Oasis today Over the left great toe there appears to be more of the wound at the tip of his toe than there was last week. This has an eschar on the surface of it. Will change to Santyl. There is seeing podiatry this afternoon 09/03/2018 He comes in today with the area on the transmetatarsal's site with a fair amount of callus, nonviable tissue over the circumference but it was not closed. I removed all of this as well as some subcutaneous debris and reapplied Oasis. There is no exposed bone The area over the tip of the left great toe started off as a nodule of uncertain etiology. He has been followed with podiatry. They have been removing part of the medial nail bed. He has nonviable tissue over the wound he been using Santyl in this area 09/10/18 Unfortunately comes in with neither wound area looking improved. The transmetatarsal amputation site once Again has nonviable debris over the surface requiring  debridement. Unfortunately underneath this there is nothing that looks viable and this once again goes right down to bone. There is no purulent drainage and no erythema. Also the surgical wound from podiatry on the left  first toe has an ischemic-looking eschar over the surface of the tip of the toe I have elected not to attempt candidly debride this His wife as arranged for him to have follow-up noninvasive studies in [place] under the care of Dr. Andree Elk clinic. The question is he has known severe PAD. They had recently seen him in August. He has had revascularizations in both legs within the last 4 or 5 months. He is not complaining of pain and I cannot really get a history of claudication. He has not systemically unwell 09/20/2018 the patient has been to [facility] and been revascularized by Dr. Andree Elk earlier this week. Apparently he was able to open up the anterior tibial artery although I have not actually seen his formal report. He is going for an attempt to revascularize on the left on Monday. He is apparently working with an investigational stent for lower extremity arteries below the knee and he has talked to the patient about placing that on Monday if possible. The patient has been using silver alginate on the transmetatarsal amputation site and Santyl on the left 09/30/2018; patient had his revascularization on the left this apparently included a standard approach as well as a more distal arterial catheterization although I do not have any information on this from Dr. Andree Elk. In fact I do not even see the initial revascularization that he had on the right. I have included the arterial history from Dr. Andree Elk last note however below; ASSESSMENT/PLAN: 1. Hx of Critical limb ischemia bilateral lower extremities, PAD: -s/p transmetatarsal amputation of the RLE. -s/p right ATA percutaneous revascularization procedure x2, most recently 11/20/2016 s/p PTA of right AT 100% to less than 20% with a 2.5 x 200 balloon. 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 -03/21/2018 s/p PTA of 90%  left popliteal artery to <10% with a 5x20 cutting balloon, PTA of 90% left peroneal to <20% with a 3x20 balloon, PTA of 100% left AT to <20% with a 2.5x220 balloon. 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 Will plan on the LLE soon after. Risks/benefits of procedure have been discussed and patient has elected to proceed. We have been using endoform to the right TMA amputation site wound and Santyl to the left great toe 10/07/2018; the area on the tip of his left great toe looked better we have been using Santyl here. We continue to have a very difficult probing hole on the right TMA amputation site we have been using endoform. I went on to use his fifth Oasis today 10/14/2018; the tip of the left great toe continues to look better. We have been using Santyl here the surface however is healthy and I think we can change to an alginate. The right TMA has not changed. Once again he has no superficial opening there is callus and thick subcutaneous tissue over the orifice once you remove this there is the probing area that we have been dealing with without too much change. There is no palpable bone I have been placing Oasis here  and put Oasis #6 in this today after a more vigorous debridement 10/21/18; the left great toe still has necrotic surface requiring debridement. The right TMA site hasn't changed in view of the thick callus over the wound bed. With removal of this there is still the opening however this does not appear to have the same depth. Again there is no palpable bone. Oasis was replaced 10/28/2018; patient comes in with both wounds looking worse. The area over the first toe tip is now down to bone. The area over the TMA site is deeper and down to bone clearly with a increase in overall wound area. Equally concerning on the right TMA is the  complete absence of a pulse this week which is a change. We are not even able to Doppler this. On the right he has a noncompressible ABI greater than 1.4 11/04/2018. Both wounds look somewhat worse. X-rays showed no osteomyelitis of the right foot but on the left there was underlying osteomyelitis in the left great toe distal phalanx. I been on the phone to Dr. Andree Elk surface at [facility] in [place] and they are arranging for another angiogram on the right on Monday. I have him on doxycycline for the osteomyelitis in the left great toe for now. Infectious disease may be necessary 1/17; 2-week hiatus. Patient was admitted to hospital at [facility]. My understanding is he underwent an angioplasty of the right anterior tibial artery and had stents placed in the left anterior artery and the left tibial peroneal trunk. This was done by Dr. Andree Elk of interventional radiology. There is no major change in either 1 of the wounds. They have been using Aquacel Ag. As far as they are aware no imaging studies were done of the foot which is indeed unfortunate. I had him on doxycycline for 2 weeks since we identified the osteomyelitis in the left great toe by plain x-ray. I have renewed that again today. I am still suspicious about osteomyelitis in the amputation site and would consider doing another MRI to compare with the one done in September. The idea of hyperbaric oxygen certainly comes up for discussion 1/24; no major change in either wound area. I have him on doxycycline for osteomyelitis at the tip of the left great toe. As noted he has been previously and recently revascularized by Dr. Andree Elk at [place]. He is tolerating the doxycycline well. For some reason we do not have an infectious disease consult yet. Culture of drainage from the right foot site last week was negative 1/31; MRI of the right foot did not show osteomyelitis of the right ankle and foot. Notable for a skin ulceration overlying the second metatarsal  stump with generalizing soft tissue edema of the ankle and foot consistent with cellulitis. Noted to have a partial-thickness tear of the Achilles tendon 7.5 cm proximal to the insertion Nothing really new in terms of symptoms. Patient's area on the tip of the left great toe is just about closed although he still has the probing area on the metatarsal amputation site. Appointment with Dr. Linus Salmons of infectious disease next week 2/7; Dr. Novella Olive did not feel that any further antibiotics were necessary he would follow-up in 2 months. The left great toe appears to be closed still some surface callus that I gently looked under high did not see anything open or anything that was threatening to be open. He still has the open area on the mid part of his TMA site using endoform 2/14; left great toe is  closed and he is completing his doxycycline as of last Sunday. He is not on any antibiotics. Unfortunately out of the right foot his wife noticed some subdermal hemorrhage this week. He had been walking 2 miles I had given him permission to do so this is not really a plantar wound. Using endoform to this wound but I changed to silver alginate this week 2/21; left great toe remains closed. Culture last week grew Streptococcus angiosis which I am not really familiar with however it is penicillin sensitive and I am going to put him on Augmentin. Not much change in the wound on the right foot the deep area is still probing precariously close to bone and the wound on the margin of the TMA is larger. 2/28; left great toe remains closed. He is completing the Augmentin I gave him last week. 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 This is being shown to Dr. Andree Elk at [place] to see if there is anything else that can be done here. He has been using silver alginate strips on the right 3/6; left great toe remains closed. He sees Dr. Andree Elk on Monday. The area on the plantar aspect of the  right foot has the same small orifice with thick callused tissue around this. However this time with removal of the callus tissue the wound is open all the way along the incision line to the end medially. We have been using silver alginate. 3/13; left toe remains closed although the area is callused. He sees Dr. Jacqualyn Posey of podiatry next week. The area on the plantar right foot looked a lot better this week. Culture I did of this was negative we use silver alginate. He is going next week for an attempt at revascularization by Dr. Andree Elk 3/23; left toe remains closed although the area is callused. He will see Dr. Jacqualyn Posey in follow-up. The area on the right plantar foot continues to look surprisingly better over the last 3 visits. We have been using silver alginate. The revascularization he was supposed to have by Dr. Andree Elk at [facility] in [place] has been canceled [facility] procedure] 4/6; the right foot remains closed albeit callused. Podiatry canceled the appointment with regards to the left great toe. He has not seen Dr. Andree Elk at [facility] but thinks that Dr. Andree Elk has "done all he can do". He would be a candidate for the hemostaemix trial Readmission 07/29/2019 Mr. Stann Mainland is a man we know well from at least 3 previous stays in this clinic. He is a type II diabetic with severe PAD followed by Dr. Andree Elk at [facility] in [place]. 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 So far everything in both of these areas has remained closed. About 2 weeks ago he went to see his podiatrist [facility] Dr. Babs Bertin. He had a thick callus on the left fifth metatarsal head that was shaved. He has developed an open wound in this area. They have been offloading this in his diabetic shoes. The patient has not had any more revascularizations by Dr. Andree Elk since the last time he was here.  ABI in our clinic at the posterior tibial on the left was 1.06 10/6; no real change in the area on the plantar met head. Small wound with 2 mm of depth. He has thick skin probably  from pressure around the wound. We have been using silver alginate 10/13; small wound in the left fifth plantar met head. Arrives today with undermining laterally and purulent drainage. Our intake nurse cultured this. His wife stated they noticed a change in color over the last day or 2. He is not systemically unwell 10/19; small wound on the fifth plantar metatarsal head. Culture I did last week showed Staphylococcus lugdunensis. Although this could be a skin contaminant the possibility of a skin and soft tissue infection was there. I did give him empiric doxycycline which should have covered this. We are using silver alginate to the wound. We put him in a total contact cast today. 10/22; small wound on the fifth plantar metatarsal head. He has completed antibiotics. He also has severe PAD which worries me about just about any wound on this man's foot 10/29; small superficial area on the fifth plantar metatarsal head. Measuring slightly smaller. He is not currently on any antibiotics. I been using a total contact cast in this man with very severe PAD but he seems to be tolerating this well 11/5; left plantar fifth metatarsal head. Silver alginate being used under a total contact cast 11/12; wound not much different than last week. Using a #15 scalpel debridement around the wound. Still silver collagen under a total contact cast. He has severe PAD and that may be playing a role in this 11/19; disappointing that the wound is not really changed that much. I think it is come down in overall surface area because originally this was on the lateral part of the fifth metatarsal head however recently it is not really changed. Most of this is filled in but it will not epithelialized there is surface debris on this which may be  mostly related to ischemia. They have an appointment with Dr. Andree Elk on 12/9 09/24/2019 on evaluation today patient appears to be doing somewhat better with regard to the wound on the left fifth metatarsal head. Fortunately there does not appear to be any signs of active infection at this time. No fevers, chills, nausea, vomiting, or diarrhea. The wound does not appear to be completely closed and I do feel like the Hydrofera Blue is helping to some degree although I feel like the cast as well as keeping things from breaking down or getting any larger at least. As far as his wound overview it shows that the overall size of the wound is slightly smaller but really maintaining within the realm of about the same. He had no troubles with the cast which is good news. 12/1; left fifth metatarsal head. Perhaps somewhat more vibrant. We have been using Hydrofera Blue. He sees Dr. Andree Elk at [place] 1 week tomorrow. He be back next week and I hope to have a better idea which way the wound is going however I will not cast him next week in case Dr. Andree Elk wants to reevaluate things in his foot. The left leg is the leg with the stent in place and I wonder whether these are going to have to be reevaluated. 12/8; left fifth metatarsal head. Quite a bit better this week. Only a small open area remains. He sees Dr. Andree Elk at [place] tomorrow. Dr. Andree Elk is previously done his vascular interventions. He has 2 stents in the left leg as I remember things. I therefore will not put him in a total contact cast today. He will be using a forefoot off loader. We will use silver collagen on the wound. We will  see him again next week 12/15; left fifth metatarsal head. He saw Dr. Andree Elk and is scheduled for an angiogram on Thursday. Unfortunately although his wound was a lot better last week it is really deteriorated this week. Small punched-out hole with depth and overhanging tissue. We have been using Hydrofera Blue 12/22; patient had an 80%  stenosis proximal to the stent I believe in the below-knee popliteal artery. This was opened by Dr. Andree Elk. According the patient the stent itself was satisfactory. I have not been able to review this note. 12/29; patient arrives today with the wound about the same size some undermining medially. We have been using Hydrofera Blue under a total contact cast and that is what we will do again today 11/04/2019. Slightly smaller in orifice but about 4 mm undermining almost circumferentially. We have been using Hydrofera Blue. Apparently the Hydrofera Blue did not stay on the wound after we removed the cast. Some minor looking cast irritation on the right lateral 1/12; unfortunately things did not go well this week. Arrives in clinic today with a deeper wound with undermining more concerning a area of pus. Specimen was obtained for culture. We are not going to be able to put him in a cast today. 1/19; purulent material last week which I cultured showed Enterobacter cloacae. He was on ampicillin previously prescribed by his primary doctor which should not have covered this however mysteriously the wound looks somewhat better. There is less depth less erythema certainly no drainage. He will need a course of ciprofloxacin which I will provide today. 1/26; he has completed the ciprofloxacin. I don't think he needs any additional antibiotics. The areas on the left fifth plantar met head. We've been using silver alginate 2/2; comes in today with 0.4 cm of circumferential undermining around a small wound in this area. We have been using silver alginate with a forefoot offloading boot 2/9; again the wound appears larger nonviable surface and with 0.4 cm roughly of circumferential undermining. We have been using silver alginate with a forefoot offloading boot. The wound does not look infected however his wife is quick to point out about swelling on the dorsal foot. 2/16; not much change. Comes in with a small wound  with a nonviable necrotic surface. Again marked undermining that I had totally removed last week. He had a arterial Doppler last week in Dr. Andree Elk office at [facility]. They have not heard these results. They are somewhat frustrated. 2/23; if anything this is worse. Undermining increased depth. Nonviable surface that looks pale. According his wife is TBI on the right was 0.22 although I have not verified this. He is going for an angiogram with Dr. Andree Elk next Monday. We are using polymen and offloading in a forefoot offloading boot. 3/2; the patient was revascularized yesterday by [facility]. He had successful PTA of the left peroneal 60% to less than 20% and successful PTA of the left ATA 100% to less than 20%. He now has a pulse in the left foot Now that he has some additional blood flow there is not a lot of options here. On one hand we want activity to help keep blood flow augmented on the other hand pressure relief is going to be paramount if we have any chance to get this to close. After some discussion with his wife and patient I went ahead and put him in a total contact cast. 3/9; he arrives in clinic today with some debris over the wound surface. We  use silver collagen under a total contact cast after his revascularization by Dr. Andree Elk 3/16; wound about the same depth at 0.5 cm. Surface area perhaps slightly less but it is the depth the really is important. We have been using silver collagen 3/23; not much change here. There is still undermining medially the depth is about the same tissue looks somewhat better. We switched to Iodoflex last week to try to get a better looking wound surface. He does have a small abrasion over the proximal interphalangeal joint dorsally. I this is so small it is difficult even to tell whether it is open. Some abrasion between the fifth toe and the webspace as well 3/30; if anything the wound is deeper this week and larger. Very disappointing.  Under illumination debris on the surface which I debrided gently with a #3 curette there is minimal bleeding. We had been using iodoflex under a total contact cast. I am going to take him out of the cast and change him to [place] today which his wife will change daily. 4/6; wound bed looks better. Still undermining medially. Some maceration and erythema around the wound. We have been using Santyl change daily. I took him out of a total contact cast last week 4/13; patient's culture from last time grew MSSA. The wound is larger not a very viable surface. Duplex ultrasound I did last week rule out DVT was negative. He still has a fair amount of pitting edema in the lower leg and dorsal foot but I am not really certain why. I started him on Keflex 500 every 6 for 10 days starting yesterday His wife tells me that Dr. Andree Elk office in [place] has been in contact they want to look at restudying I believe to see if there is further procedures planned. I told his wife he still has a feeble dorsalis pedis pulse certainly not as robust as after the last procedure although this could be partially because of edema 4/20; he has completed his antibiotics 2 weeks worth of Keflex for MSSA. He has necrotic material over a large portion of this wound. The swelling in his leg is come down presumably resolved with treatment of the infection. He will have a arterial ultrasound by Dr. Andree Elk on Friday and apparently they see him later that day. We are using Bactroban and silver alginate 4/27 patient had his arterial Dopplers ABIs and TBI's done by Dr. Andree Elk. He was noted to have a moderate to significant stenosis involving the left proximal superficial artery consistent with a 50 to 69% diameter reduction. The left anterior tibial artery including the dorsalis pedis was patent. There was no flow in the distal posterior tibial artery monophasic flow within the distal peroneal artery. The great toe index was 0.32 his absolute  great toe pressure was 38 arterial flow was detected by PPG within all 5 digits. He is due to have angiography again on 5/13 Dr. Andree Elk put him back on 2 weeks worth of Keflex. The wound has made absolutely no progress. Interestingly it is more centered over the lateral part of his foot started more on the plantar aspect. 5/4; he is going for his procedure a week Thursday. We will see him back next week he is using Santyl with calcium alginate. His MRI is not toe the 17th. Electronic Signature(s) Signed: 03/02/2020 5:25:34 PM By: [name] MD Entered By: [name] on 03/02/2020 13:22:14 -------------------------------------------------------------------------------- Physical Exam Details Patient Name: Date of Service: RO Ardis Hughs, FO YE 03/02/2020 12:30 PM Medical  Record Number: [id number] Patient Account Number: [id number] Date of Birth/Sex: Treating RN: 1946-05-07 (73 y.o. [name]) [name] Primary Care Provider: [name] Other Clinician: Referring Provider: Treating Provider/Extender: Leonette Nutting in Treatment: 31 Constitutional Sitting or standing Blood Pressure is within target range for patient.. Pulse regular and within target range for patient.Marland Kitchen Respirations regular, non-labored and within target range.. Temperature is normal and within the target range for the patient.Marland Kitchen Appears in no distress. Notes Wound exam; the surface lightly covered and necrotic tissue. I was able to remove some of this from the circumference and get viable tissue with some blood flow. The rest of this has nothing but probably tendon over the MTP. There is no clear evidence of surrounding infection Electronic Signature(s) Signed: 03/02/2020 5:25:34 PM By: [name] MD Entered By: [name] on 03/02/2020 13:23:05 -------------------------------------------------------------------------------- Physician Orders Details Patient Name: Date of Service: Ileana Roup, [place]  03/02/2020 12:30 PM Medical Record Number: [id number] Patient Account Number: [id number] Date of Birth/Sex: Treating RN: January 13, 1946 (73 y.o. [name] Primary Care Provider: [name] Other Clinician: Referring Provider: Treating Provider/Extender: Leonette Nutting in Treatment: 56 Verbal / Phone Orders: No Diagnosis Coding ICD-10 Coding Code Description E11.621 Type 2 diabetes mellitus with foot ulcer E11.51 Type 2 diabetes mellitus with diabetic peripheral angiopathy without gangrene L97.528 Non-pressure chronic ulcer of other part of left foot with other specified severity Follow-up Appointments Return Appointment in 1 week. Dressing Change Frequency Wound #5 Left Metatarsal head fifth Change dressing every day. Wound Cleansing Wound #5 Left Metatarsal head fifth May shower and wash wound with soap and water. Primary Wound Dressing Wound #5 Left Metatarsal head fifth lginate - over santyl Calcium A Santyl Ointment Other: - pad right great toe for protection Secondary Dressing Foam - secure with tape Off-Loading Wedge shoe to: - with felt. apply to left foot. Additional Orders / Instructions Other: - closely monitor redness. Electronic Signature(s) Signed: 03/02/2020 5:25:34 PM By: [name] MD Signed: 03/03/2020 4:37:47 PM By: [name] RN Entered By: [name] on 03/02/2020 12:41:59 -------------------------------------------------------------------------------- Problem List Details Patient Name: Date of Service: Ileana Roup, Carola Frost YE 03/02/2020 12:30 PM Medical Record Number: [id number] Patient Account Number: [id number] Date of Birth/Sex: Treating RN: 1946/06/30 (73 y.o. [name] Primary Care Provider: [name] Other Clinician: Referring Provider: Treating Provider/Extender: Leonette Nutting in Treatment: 31 Active Problems ICD-10 Encounter Code Description Active Date MDM Diagnosis E11.621 Type  2 diabetes mellitus with foot ulcer 07/29/2019 No Yes E11.51 Type 2 diabetes mellitus with diabetic peripheral angiopathy without gangrene 07/29/2019 No Yes L97.528 Non-pressure chronic ulcer of other part of left foot with other specified 02/24/2020 No Yes severity Inactive Problems ICD-10 Code Description Active Date Inactive Date L97.521 Non-pressure chronic ulcer of other part of left foot limited to breakdown of skin 07/29/2019 07/29/2019 L03.116 Cellulitis of left lower limb 08/12/2019 08/12/2019 Resolved Problems Electronic Signature(s) Signed: 03/02/2020 5:25:34 PM By: [name] MD Entered By: [name] on 03/02/2020 13:19:38 -------------------------------------------------------------------------------- Progress Note Details Patient Name: Date of Service: Ileana Roup, Carola Frost YE 03/02/2020 12:30 PM Medical Record Number: [id number] Patient Account Number: [id number] Date of Birth/Sex: Treating RN: 1946-06-07 (73 y.o. [name] Primary Care Provider: [name] Other Clinician: Referring Provider: Treating Provider/Extender: Leonette Nutting in Treatment: 31 Subjective History of Present Illness (HPI) 05/18/16; this is a 74year-old diabetic who is a type II diabetic on insulin. The history is that he traumatized his right  foot developed a sore sometime in late March. 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 According to the patient and his wife this wound never really healed. He was seen on 2 occasions in the wound care center in [place] and had vascular studies and then was referred urgently to Dr. Bridgett Larsson of vascular surgery. He underwent an angiogram on 05/10/16. Unfortunately nothing really could be done to improve his vascular  status. He had a 75- 90% stenosis in the midsegment of 1 segment of the posterior femoral artery. He had a patent popliteal, his anterior tibial occluded shortly after takeoff. Perineal had a greater than 90% stenosis posterior tibial is occluded feet had no distal collaterals feed distal aspect of the transmetatarsal amputation site. The patient tells me that he had a prolonged period of Santyl by Dr. Doran Durand was some initial improvement but then this was stopped. I think they're only applying daily dressings/dry dressings. He has not had a recent x-ray of the right foot he did have one before his surgery in April. His wife by the dimensions of the wound/surgical site being followed at home since 4/20. At that point the dimensions were 0.5 x 12 x 0.2 on 7/19 this was 1.8 x 6 x 0.4. He is not currently on any antibiotics. His hemoglobin A1c in early April was 12.9 at that point he was started on insulin. Apparently his blood sugars are much lower he has an appointment with Dr. Legrand Como Alteimer of endocrine next week. 05/29/16 x-ray of the area did not show osteomyelitis. I think he probably needs an MRI at this point. His wife is asking about something called"Yireh" cream which is not FDA approved. I have not heard of this. 06/22/16; MRI did not really suggest osteomyelitis. There was minimal marrow edema and enhancement in the stump of the second metatarsal felt to be secondary likely to postoperative change rather than osteomyelitis. The patient has arranged his own consultation with Dr. Andree Elk at [place], apparently their daughter lives in [place] and has some connection here. Any improvement in vascular supply by Dr. Andree Elk would of course be helpful. Dr. Bridgett Larsson did not feel that anything further could be done other than amputation if wound care did not result in healing or if the area deteriorates. 06/26/16; the patient has been to see Dr. Andree Elk at [place] and had an angiogram. He is going for a procedure on  Thursday which will involve catheterization. I'm not sure if this is an anterograde or retrograde approach. He has been using Santyl to the wound 07/10/16; the patient had a repeat angiogram and angioplasty at [place] by Dr. Brunetta Jeans. His angiogram showed right CFA and profundal widely patent. The right as of a.m. popliteal artery were widely patent the right anterior tibial was occluded proximally and reconstitutes at the ankle. Peroneal artery was patent to the foot. Posterior tibial artery was occluded. The patient had angioplasty of the anterior tibial artery.. This was quite successful. He was recommended for Plavix as well as aspirin. 07/17/16; the patient was close to be a nurse visit today however the outer dressing of the Apligraf fell off. Noted drainage. I was asked to see the wound. The patient is noted an odor however his wife had noted that. Drainage with Apligraf not  necessarily a bad thing. He has not been systemically unwell 07/24/16; we are still have an issue with drainage of this wound. In spite of this I applied his second Apligraf. Medially the area still is probing to bone. 08/07/16; Apligraf reapplied in general wound looks improved. 08/21/16 Apligraf #4. Wound looks much better 09/04/16 patientt's wound again today continues to appear to improve with the application of the Apligraf's. He notes no increased discomfort or concerns at this point in time. 09/18/16; the patient returns today 2 weeks after his fifth application of Apligraf. Predictably three quarters of the width of this wound has healed. The deep area that probe to bone medially is still open. The patient asked how much out-of-pocket dollars would be for additional Apligraf's. 09/25/16; now using Hydrofera Blue. He has completed 5 Apligraf applications with considerable improvement in this deep open transmetatarsal amputation site. His wound is now a triangular-shaped wound on the medial aspect. At roughly 12 to 2:00  this probes another centimeter but as opposed to in the past this does not probe to bone. The patient has been seen [facility] by [facility]. Zenia Resides. He is not planning to do any more revascularization unless the wound stalls or worsens per the patient 10/02/16; 0.7 x 0.8 x 0.8. Unfortunately although the wound looks stable to improved. There is now easily probable bone. This hasn't been present for several weeks. Patient is not otherwise symptomatic he is not experiencing any pain. I did a culture of the wound bed 10/09/16. Deterioration last week. Culture grew MRSA and although there is improvement here with doxycycline prescribed over the phone I'm going to try to get him linezolid 600 twice a day for 10 days today. 10/16/16; he is completing a weeks worth of linezolid and still has 3 more days to go. Small triangular-shaped open area with some degree of undermining. There is still palpable bone with a curet. Overall the area appears better than last week 10/20/16 he has completed the linezolid still having some nausea and vomiting but no diarrhea. He has exposed bone this week which is a deterioration. 10/27/16 patient now has a small but probing wound down to bone. Culture of this bone that I did last week showed a few methicillin-resistant staph aureus. I have little doubt that this represents acute/subacute osteomyelitis. The patient is currently on Doxy which I will continue he also completed 10 days of linezolid. We are now in a difficult situation with this patient's foot after considerable discussion we will send him back to see Dr. Doran Durand for a surgical opinion of this I'm also going to try to arrange a infectious disease consult at [facility] hopefully week and get this prior to her usual 4-6 weeks we having [place]. The patient clearly is going to need 6 weeks of IV vancomycin. If we cannot arrange this expediently I'll have to consider ordering this myself through a home infusion company 11/03/16; the  patient now has a small in terms of circumference but probing wound. No bone palpable today. The patient remains on doxycycline 100 twice a day which should support him until he sees infectious disease at [facility] next week the following week on Wednesday I believe he has an appointment with Dr. Andree Elk at [facility] who is his vascular cardiologist. Finally he has an appointment with Dr. Doran Durand on 11/22/16 we have been using silver alginate. The patient's wife states they are having trouble getting this through [facility] 11/13/16; the patient was seen by infectious disease at [facility]  in the 11th PICC line placed in preparation for IV antibiotics. A tummy he has not going to get IV vancomycin o Ceftaroline. They've also ordered an MRI. Patient has a follow-up with Dr. Doran Durand on 11/22/16 and Dr. Andree Elk at [facility] tomorrow 11/23/16 the patient is on daptomycin as directed by infectious disease at [facility]. He is also been back to see Dr. Andree Elk at [facility]. He underwent a repeat arteriogram. He had a successful PTA of the right anterior tibial artery. He is on dual antiplatelete treatment with Plavix and aspirin. Finally he had the MRI of his foot in [place]. This showed cellulitis about the foot worse distally edema and enhancement in the reminiscent of the second metatarsal was consistent with osteomyelitis therefore what I was assuming to be the first metatarsal may be actually the second. He also has a fluid collection deep to the calcaneus at the level of the calcaneal spur which could be an abscess or due to adventitial bursitis. He had a small tear in his Achilles 11/30/16; the patient continues on daptomycin as directed by infectious disease at [facility]. He is been revascularized by Dr. Andree Elk at [facility] in [place]. He has been to see Gretta Arab who was the orthopedic surgeon who did his original amputation. I have not seen his not however per the patient's wife he did not offer another surgical  local surgical prodecure to remove involved bone. He verbalized his usual disbelief in not just hyperbarics but any medical therapy for this condition(osteomyelitis). I discussed this in detail with the patient today including answering the question about a BKA definitively "curing" the current condition. 12/07/16; the patient continues on daptomycin as directed by infectious disease at [facility]. This is directed at the MRSA that we cultured from his bone debridement from 12/22. Lab work today shows a white count of 8.7 hemoglobin of 10.5 which is microcytic and hypochromic differential count shows a slightly elevated monocyte count at 1.2 eosinophilic count of 0.6. His creatinine is 1.11 sedimentation rate apparently is gone from 35-34 now 40. I explained was wife I don't think this represents a trend. His total CK is 48 12/14/16- patient is here for follow-up evaluation of his right TMA site. He continues to receive IV daptomycin per infectious disease. His serum inflammatory markers remain elevated. He complains of intermittent pain to the medial aspect of the TMA site with intermittent erythema. He voices no complaints or concerns regarding hyperbaric therapy. Overall he and his wife are expressing a frustration and discouragement regarrding the length of time of treatment. 12/21/16; small open wound at roughly the first or second metatarsal metatarsalphalyngeal joint reminiscence of his transmetatarsal amputation site he continues to receive IV daptomycin per infectious disease at [facility]. He will finish these a week tomorrow. He has lab work which I been copied on. His white count is 10.8 hemoglobin 8.9 MCV is low at 75., MCH low at 24.5 platelet count slightly elevated at 626. Differential count shows 70% neutrophils 10% monocytes and 10% eosinophils. His comprehensive metabolic panel shows a slightly low sodium at 133 albumin low at 3.1 total CK is normal at 42 sedimentation rate is much higher at  82. He has had iron studies that show a serum iron of 15 and iron binding capacity of 238 and iron saturation of 6. This is suggestive of iron deficiency. B12 and folate were normal ferritin at 113 The patient tells me that he is not eating well and he has lost weight. He feels  episodically nauseated. He is coughing and gagging on mucus which she thinks is sinusitis. He has an appointment with his primary doctor at 5:00 this afternoon in [facility] 01/02/17; the patient developed a subacute pneumonitis. He was admitted to [facility] after a CT scan showed an extensive interstitial pneumonitis [I have not yet seen this]. 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 He has since been discharged. He is not on oxygen. He feels fatigued and very short of breath with exertion. At [facility] the wound care nurse there felt that his wound was healed. He did complete his daptomycin and has follow-up with infectious disease on Friday. X-rays I did before he went to [facility] still suggested residual osteomyelitis in the anterior aspect of the second must metatarsal head. I'm not sure I would've expected any different. There was no other findings. I actually think I did this because of erythema over the first metatarsal head reminiscent 01/11/17; the patient has eosinophilic pneumonitis. He has been reviewed by pulmonology and given clearance for hyperbarics at least that's what his wife says. I'll need to see if there is note in care everywhere. Apparently the prognosis for improvement of daptomycin induced eosinophilic granulocyte is is 3 months without steroids. In the meantime infectious disease has placed him on doxycycline until the wound is closed. He is still is lost a lot of weight and his blood sugars are running in the mid 60s to low 80s fasting and at all times during the day 01/18/17; he has had  adjustments in his insulin apparently his blood sugars in the morning or over 100. He wants to restart his hyperbaric treatment we'll do this at 1:00. 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 I think there is good reason to complete his treatments in order to give him the best chance of maintaining a healed status and these DFU 3 wounds with MRSA infection in the bone 02/15/17; the patient was seen today in conjunction with HBO. He completed hyperbaric oxygen today. The open area on his transmetatarsal site has remained closed. There was an area of erythema when I saw him earlier in the week on the posterior heel although that is resolved as of today as well. He has been using a cam walker. This is a patient who came to [place] after a transmetatarsal amputation that was necrotic and dehisced. He required revascularization percutaneously on 2 different occasions by Dr. Andree Elk of invasive cardiology at [facility]. 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 He went to [facility] infectious disease and had IV daptomycin for 5 weeks before developing eosinophilic pneumonitis and requiring an admission to hospital/ICU. He made a good recovery and is continued on doxycycline since. As mentioned his foot is closed now. He has a follow-up with Dr. Andree Elk tomorrow. He is going to [employer] on Monday for a custom-made shoe READMISSION Last visit Dr. Andree Elk [facility] 02/16/17 1. Critical limb ischemia of the RLE:  s/p transmetatarsal amputation of the RLE, now completely healed.  s/p right ATA percutaneous revascularization procedure x2, most recently 11/20/2016 s/p PTA of right AT 100% to less than 20% with a 2.5 x 200 balloon.  He does have some residual osteomyelitis, but given the wound is closed, the orthopedist has recommended to  follow. He  has a fitting for a special shoe coming up next week. He has completed a course of daptomycin and doxycycline and he has been discharged by ID. He has completed a course of hyperbaric therapy.  Continue Continue medical management with aspirin, Plavix and statin therapy. We will discuss ongoing Plavix therapy at follow-up in 6 months.  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 Given no symptoms, we will conservatively manage. He doesn't want to do any more invasive studies at this time, which is reasonable. The patient arrives today out of 2 concerns both on the right transmetatarsal site. 1 at the level of the reminiscent fifth metatarsal head and the other at roughly the first or second. Both of these look like dark subcutaneous discoloration probably subdermal bleeding. He is recently obtained new adaptive footwear for the right foot. He also has a callus on the left fifth dorsal toe however he follows with podiatry for this and I don't think this is any issue. ABIs in this clinic today were 0.66 on the right and 1.06 on the left. On entrance into our clinic initially this was 0.95 and 0.92. He does not describe current claudication. They're going away on a cruise in 8 weeks and I think are trying to do the month is much as they can proactively. They follow with podiatry and have an appointment with Dr. Andree Elk in October READMISSION 06/25/18 This is a patient that we have not seen in almost a year. He is a type II diabetic with known PAD. He is followed by Dr. Andree Elk of interventional cardiology at [facility] in [place]. He is required revascularization for significant PAD. When we first saw him he required a transmetatarsal amputation. He had underlying osteomyelitis with a nonhealing surgical wound. This eventually closed with wound care, IV antibiotics  and hyperbaric oxygen. They tell me that he has a modified shoe and he is very active walking up to 4 miles a day. He is followed by Dr. Geroge Baseman of podiatry. His wife states that he underwent a removal of callus over this site on July 9. She felt there may be drainage from this site after that although she could never really determined and there was callus buildup again. On 06/01/18 there was pressure bleeding through the overlying callus. he was given a prescription for 7 days of Bactrim. Fortuitously he has an appointment with Dr. Andree Elk on 06/04/18 and he immediately underwent revascularization of the right leg although I have not had a chance to review these records in care everywhere. This was apparently done through anterior and retrograde access. On 06/06/18 he had another debridement by Dr. Bernette Mayers. Vitamin soaking with Epsom salts for 20 minutes and applying calcium alginate. The original trans-met was on April 2017 I believe by Dr. Doran Durand. His ABI in our clinic was noncompressible today. 07/02/18; x-ray I ordered last week was negative for osteomyelitis. Swab culture was also negative. He is going to require an MRI which I have ordered today. 07/09/18; surprisingly the MRI of the foot that I ordered did not show osteomyelitis. He did suggest the possibility of cellulitis. For this reason I'll go ahead and give him a 10 day course of doxycycline. Although the previous culture of this area was negative 07/16/18; it arrives with the wound looking much the same. Roughly the same depth. He has thick subcutaneous tissue around the wound orifice but  this still has roughly the same depth. Been using silver alginate. We applied Oasis #1 today 07/23/2018; still having to remove a lot of callus and thick subcutaneous tissue to actually define the wound here. Most of this seems to have closed down yet he has a comma shaped divot over the top of the area that still I think is open. We applied Oasis #2 His wife  expressed concern about the tip of his left great toe. This almost looks like a small blister. She also showed it today to her podiatrist Dr. Geroge Baseman who did not think this was anything serious. I am not sure is anything serious either however I think it bears some watching. He is not in any pain however he is insensate 07/30/2018;; still on a lot of nonviable tissue over the surface of the wound however cleaning this up reveals a more substantial wound orifice but less of a probing wound depth. This is not probed to bone. There is no evidence of infection The wife is still concerned about a non-open area on the tip of his left great toe. Almost feels like a bony outgrowth. She had previously showed this to podiatry. I do not think this is a blister. A friction area would be possible although he is really not walking according to his wife. He had a small skin tag on his right buttock but no open wound here either 08/06/2018; we applied a third Oasis last week. Unfortunately there is really no improvement. Still requiring extensive debridement to expose the wound bed from a horizontal slitlike depression. I still have not been able to get this to fill in properly. On the positive side there is now no probable bone from when he first came into the facility. I changed him to silver alginate today after a reasonably aggressive debridement 08/13/2018; once again the patient comes in with skin and subcutaneous tissue closing over the small probing area with the underlying cavity of the wound on the right TMA site. I applied silver alginate to this last week. Prior to that we used Oasis x3 still not able to get this area granulating. He does not have a probing area of the bone which is an improvement from when I spur started working on this and an MRI did not suggest osteomyelitis. I do not see evidence of infection here but I am increasingly concerned about why I cannot get this area to granulate. Each time I  debrided this is looks like this is simply a matter of getting granulation to fill in the hole and then getting epithelialization. This does not seem to happen Also I sent him back to see podiatry Dr. Earleen Newport about the what felt to be bony outgrowth on the tip of his left great toe. Apparently after heel he left the clinic last week or the next day he developed a blood blister. He did see Dr. Earleen Newport. He went on to have a debridement of the medial nail cuticle he now has an open area here as well as some denuded skin. They did an x-ray apparently does have a bony outgrowth or spur but I am not able to look at this. They are using topical antibiotics apparently there was some suggested he use Santyl. Patient's wife was anxious for my opinion of this 08/20/2018; we are able to keep the wound open this time instead of the thick subcutaneous tissue closing over the top of it however unfortunately once again this probes to bone. I do not  see any evidence of infection and previous MRI did not show osteomyelitis. I elected to go back to the Oasis to see if we can stimulate some granulation. Last saw his vascular interventional cardiologist Dr. Andree Elk at the beginning of August and he had a repeat procedure. Nevertheless I wonder how much blood flow he has down to this area With regards to the left first toe he is seeing Dr. Earleen Newport next week. They are applying Bactroban to this area. 08/27/2018 ooOnce again he comes in with thick eschar and subcutaneous tissue over the top of the small probing hole. This does not appear to go down to bone but it still has roughly the same depth. I reapplied Oasis today ooOver the left great toe there appears to be more of the wound at the tip of his toe than there was last week. This has an eschar on the surface of it. Will change to Santyl. There is seeing podiatry this afternoon 09/03/2018 ooHe comes in today with the area on the transmetatarsal's site with a fair amount  of callus, nonviable tissue over the circumference but it was not closed. I removed all of this as well as some subcutaneous debris and reapplied Oasis. There is no exposed bone ooThe area over the tip of the left great toe started off as a nodule of uncertain etiology. He has been followed with podiatry. They have been removing part of the medial nail bed. He has nonviable tissue over the wound he been using Santyl in this area 09/10/18 ooUnfortunately comes in with neither wound area looking improved. The transmetatarsal amputation site once Again has nonviable debris over the surface requiring debridement. Unfortunately underneath this there is nothing that looks viable and this once again goes right down to bone. There is no purulent drainage and no erythema. ooAlso the surgical wound from podiatry on the left first toe has an ischemic-looking eschar over the surface of the tip of the toe I have elected not to attempt candidly debride this ooHis wife as arranged for him to have follow-up noninvasive studies in [place] under the care of Dr. Andree Elk clinic. The question is he has known severe PAD. They had recently seen him in August. He has had revascularizations in both legs within the last 4 or 5 months. He is not complaining of pain and I cannot really get a history of claudication. He has not systemically unwell 09/20/2018 the patient has been to [facility] and been revascularized by Dr. Andree Elk earlier this week. Apparently he was able to open up the anterior tibial artery although I have not actually seen his formal report. He is going for an attempt to revascularize on the left on Monday. He is apparently working with an investigational stent for lower extremity arteries below the knee and he has talked to the patient about placing that on Monday if possible. The patient has been using silver alginate on the transmetatarsal amputation site and Santyl on the left 09/30/2018; patient had  his revascularization on the left this apparently included a standard approach as well as a more distal arterial catheterization although I do not have any information on this from Dr. Andree Elk. In fact I do not even see the initial revascularization that he had on the right. I have included the arterial history from Dr. Andree Elk last note however below; ASSESSMENT/PLAN: 1. Hx of Critical limb ischemia bilateral lower extremities, PAD: -s/p transmetatarsal amputation of the RLE. -s/p right ATA percutaneous revascularization procedure x2, most recently 11/20/2016  s/p PTA of right AT 100% to less than 20% with a 2.5 x 200 balloon. 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 -03/21/2018 s/p PTA of 90% left popliteal artery to <10% with a 5x20 cutting balloon, PTA of 90% left peroneal to <20% with a 3x20 balloon, PTA of 100% left AT to <20% with a 2.5x220 balloon. 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 Will plan on the LLE soon after. Risks/benefits of procedure have been discussed and patient has elected to proceed. We have been using endoform to the right TMA amputation site wound and Santyl to the left great toe 10/07/2018; the area on the tip of his left great toe looked better we have been using Santyl here. We continue to have a very difficult probing hole on the right TMA amputation site we have been using endoform. I went on to use his fifth Oasis today 10/14/2018; the tip of the left great toe continues to look better. We have been using Santyl here the surface however is healthy and I think we can change to an alginate. The right TMA has not changed. Once again he has no superficial opening there is callus and thick  subcutaneous tissue over the orifice once you remove this there is the probing area that we have been dealing with without too much change. There is no palpable bone I have been placing Oasis here and put Oasis #6 in this today after a more vigorous debridement 10/21/18; the left great toe still has necrotic surface requiring debridement. The right TMA site hasn't changed in view of the thick callus over the wound bed. With removal of this there is still the opening however this does not appear to have the same depth. Again there is no palpable bone. Oasis was replaced 10/28/2018; patient comes in with both wounds looking worse. The area over the first toe tip is now down to bone. The area over the TMA site is deeper and down to bone clearly with a increase in overall wound area. Equally concerning on the right TMA is the complete absence of a pulse this week which is a change. We are not even able to Doppler this. On the right he has a noncompressible ABI greater than 1.4 11/04/2018. Both wounds look somewhat worse. X-rays showed no osteomyelitis of the right foot but on the left there was underlying osteomyelitis in the left great toe distal phalanx. I been on the phone to Dr. Andree Elk surface at [facility] in [place] and they are arranging for another angiogram on the right on Monday. I have him on doxycycline for the osteomyelitis in the left great toe for now. Infectious disease may be necessary 1/17; 2-week hiatus. Patient was admitted to hospital at [place]. My understanding is he underwent an angioplasty of the right anterior tibial artery and had stents placed in the left anterior artery and the left tibial peroneal trunk. This was done by Dr. Andree Elk of interventional radiology. There is no major change in either 1 of the wounds. They have been using Aquacel Ag. As far as they are aware no imaging studies were done of the foot which is indeed unfortunate. I had him on doxycycline for 2 weeks since we  identified the  osteomyelitis in the left great toe by plain x-ray. I have renewed that again today. I am still suspicious about osteomyelitis in the amputation site and would consider doing another MRI to compare with the one done in September. The idea of hyperbaric oxygen certainly comes up for discussion 1/24; no major change in either wound area. I have him on doxycycline for osteomyelitis at the tip of the left great toe. As noted he has been previously and recently revascularized by Dr. Andree Elk at [place]. He is tolerating the doxycycline well. For some reason we do not have an infectious disease consult yet. Culture of drainage from the right foot site last week was negative 1/31; MRI of the right foot did not show osteomyelitis of the right ankle and foot. Notable for a skin ulceration overlying the second metatarsal stump with generalizing soft tissue edema of the ankle and foot consistent with cellulitis. Noted to have a partial-thickness tear of the Achilles tendon 7.5 cm proximal to the insertion Nothing really new in terms of symptoms. Patient's area on the tip of the left great toe is just about closed although he still has the probing area on the metatarsal amputation site. Appointment with Dr. Linus Salmons of infectious disease next week 2/7; Dr. Novella Olive did not feel that any further antibiotics were necessary he would follow-up in 2 months. The left great toe appears to be closed still some surface callus that I gently looked under high did not see anything open or anything that was threatening to be open. He still has the open area on the mid part of his TMA site using endoform 2/14; left great toe is closed and he is completing his doxycycline as of last Sunday. He is not on any antibiotics. Unfortunately out of the right foot his wife noticed some subdermal hemorrhage this week. He had been walking 2 miles I had given him permission to do so this is not really a plantar wound. Using endoform  to this wound but I changed to silver alginate this week 2/21; left great toe remains closed. Culture last week grew Streptococcus angiosis which I am not really familiar with however it is penicillin sensitive and I am going to put him on Augmentin. Not much change in the wound on the right foot the deep area is still probing precariously close to bone and the wound on the margin of the TMA is larger. 2/28; left great toe remains closed. He is completing the Augmentin I gave him last week. 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 This is being shown to Dr. Andree Elk at [facility] to see if there is anything else that can be done here. He has been using silver alginate strips on the right 3/6; left great toe remains closed. He sees Dr. Andree Elk on Monday. The area on the plantar aspect of the right foot has the same small orifice with thick callused tissue around this. However this time with removal of the callus tissue the wound is open all the way along the incision line to the end medially. We have been using silver alginate. 3/13; left toe remains closed although the area is callused. He sees Dr. Jacqualyn Posey of podiatry next week. The area on the plantar right foot looked a lot better this week. Culture I did of this was negative we use silver alginate. He is going next week for an attempt at revascularization by Dr. Andree Elk 3/23; left toe remains closed although the area is callused.  He will see Dr. Jacqualyn Posey in follow-up. The area on the right plantar foot continues to look surprisingly better over the last 3 visits. We have been using silver alginate. The revascularization he was supposed to have by Dr. Andree Elk at [facility] in [place] has been canceled [facility] procedure] 4/6; the right foot remains closed albeit callused. Podiatry canceled the appointment with regards to the left great toe. He has not seen Dr. Andree Elk at [facility] but thinks that Dr. Andree Elk has "done all he  can do". He would be a candidate for the hemostaemix trial Readmission 07/29/2019 Mr. Stann Mainland is a man we know well from at least 3 previous stays in this clinic. He is a type II diabetic with severe PAD followed by Dr. Andree Elk at [facility] in [place]. 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 So far everything in both of these areas has remained closed. About 2 weeks ago he went to see his podiatrist [facility] Dr. Babs Bertin. He had a thick callus on the left fifth metatarsal head that was shaved. He has developed an open wound in this area. They have been offloading this in his diabetic shoes. The patient has not had any more revascularizations by Dr. Andree Elk since the last time he was here. ABI in our clinic at the posterior tibial on the left was 1.06 10/6; no real change in the area on the plantar met head. Small wound with 2 mm of depth. He has thick skin probably from pressure around the wound. We have been using silver alginate 10/13; small wound in the left fifth plantar met head. Arrives today with undermining laterally and purulent drainage. Our intake nurse cultured this. His wife stated they noticed a change in color over the last day or 2. He is not systemically unwell 10/19; small wound on the fifth plantar metatarsal head. Culture I did last week showed Staphylococcus lugdunensis. Although this could be a skin contaminant the possibility of a skin and soft tissue infection was there. I did give him empiric doxycycline which should have covered this. We are using silver alginate to the wound. We put him in a total contact cast today. 10/22; small wound on the fifth plantar metatarsal head. He has completed antibiotics. He also has severe PAD which worries me about just about any wound on this man's foot 10/29; small superficial area on the fifth plantar  metatarsal head. Measuring slightly smaller. He is not currently on any antibiotics. I been using a total contact cast in this man with very severe PAD but he seems to be tolerating this well 11/5; left plantar fifth metatarsal head. Silver alginate being used under a total contact cast 11/12; wound not much different than last week. Using a #15 scalpel debridement around the wound. Still silver collagen under a total contact cast. He has severe PAD and that may be playing a role in this 11/19; disappointing that the wound is not really changed that much. I think it is come down in overall surface area because originally this was on the lateral part of the fifth metatarsal head however recently it is not really changed. Most of this is filled in but it will not epithelialized there is surface debris on this which may be mostly related to ischemia. They have an appointment with Dr. Andree Elk on 12/9 09/24/2019  on evaluation today patient appears to be doing somewhat better with regard to the wound on the left fifth metatarsal head. Fortunately there does not appear to be any signs of active infection at this time. No fevers, chills, nausea, vomiting, or diarrhea. The wound does not appear to be completely closed and I do feel like the Hydrofera Blue is helping to some degree although I feel like the cast as well as keeping things from breaking down or getting any larger at least. As far as his wound overview it shows that the overall size of the wound is slightly smaller but really maintaining within the realm of about the same. He had no troubles with the cast which is good news. 12/1; left fifth metatarsal head. Perhaps somewhat more vibrant. We have been using Hydrofera Blue. He sees Dr. Andree Elk at [place] 1 week tomorrow. He be back next week and I hope to have a better idea which way the wound is going however I will not cast him next week in case Dr. Andree Elk wants to reevaluate things in his foot. The left  leg is the leg with the stent in place and I wonder whether these are going to have to be reevaluated. 12/8; left fifth metatarsal head. Quite a bit better this week. Only a small open area remains. He sees Dr. Andree Elk at [place] tomorrow. Dr. Andree Elk is previously done his vascular interventions. He has 2 stents in the left leg as I remember things. I therefore will not put him in a total contact cast today. He will be using a forefoot off loader. We will use silver collagen on the wound. We will see him again next week 12/15; left fifth metatarsal head. He saw Dr. Andree Elk and is scheduled for an angiogram on Thursday. Unfortunately although his wound was a lot better last week it is really deteriorated this week. Small punched-out hole with depth and overhanging tissue. We have been using Hydrofera Blue 12/22; patient had an 80% stenosis proximal to the stent I believe in the below-knee popliteal artery. This was opened by Dr. Andree Elk. According the patient the stent itself was satisfactory. I have not been able to review this note. 12/29; patient arrives today with the wound about the same size some undermining medially. We have been using Hydrofera Blue under a total contact cast and that is what we will do again today 11/04/2019. Slightly smaller in orifice but about 4 mm undermining almost circumferentially. We have been using Hydrofera Blue. Apparently the Hydrofera Blue did not stay on the wound after we removed the cast. Some minor looking cast irritation on the right lateral 1/12; unfortunately things did not go well this week. Arrives in clinic today with a deeper wound with undermining more concerning a area of pus. Specimen was obtained for culture. We are not going to be able to put him in a cast today. 1/19; purulent material last week which I cultured showed Enterobacter cloacae. He was on ampicillin previously prescribed by his primary doctor which should not have covered this however mysteriously  the wound looks somewhat better. There is less depth less erythema certainly no drainage. He will need a course of ciprofloxacin which I will provide today. 1/26; he has completed the ciprofloxacin. I don't think he needs any additional antibiotics. The areas on the left fifth plantar met head. We've been using silver alginate 2/2; comes in today with 0.4 cm of circumferential undermining around a small wound in this area. We have been  using silver alginate with a forefoot offloading boot 2/9; again the wound appears larger nonviable surface and with 0.4 cm roughly of circumferential undermining. We have been using silver alginate with a forefoot offloading boot. The wound does not look infected however his wife is quick to point out about swelling on the dorsal foot. 2/16; not much change. Comes in with a small wound with a nonviable necrotic surface. Again marked undermining that I had totally removed last week. He had a arterial Doppler last week in Dr. Andree Elk office at [facility]. They have not heard these results. They are somewhat frustrated. 2/23; if anything this is worse. Undermining increased depth. Nonviable surface that looks pale. According his wife is TBI on the right was 0.22 although I have not verified this. He is going for an angiogram with Dr. Andree Elk next Monday. We are using polymen and offloading in a forefoot offloading boot. 3/2; the patient was revascularized yesterday by [facility]. He had successful PTA of the left peroneal 60% to less than 20% and successful PTA of the left ATA 100% to less than 20%. He now has a pulse in the left foot Now that he has some additional blood flow there is not a lot of options here. On one hand we want activity to help keep blood flow augmented on the other hand pressure relief is going to be paramount if we have any chance to get this to close. After some discussion with his wife and patient I went ahead and put him in a total  contact cast. 3/9; he arrives in clinic today with some debris over the wound surface. We use silver collagen under a total contact cast after his revascularization by Dr. Andree Elk 3/16; wound about the same depth at 0.5 cm. Surface area perhaps slightly less but it is the depth the really is important. We have been using silver collagen 3/23; not much change here. There is still undermining medially the depth is about the same tissue looks somewhat better. We switched to Iodoflex last week to try to get a better looking wound surface. He does have a small abrasion over the proximal interphalangeal joint dorsally. I this is so small it is difficult even to tell whether it is open. Some abrasion between the fifth toe and the webspace as well 3/30; if anything the wound is deeper this week and larger. Very disappointing. Under illumination debris on the surface which I debrided gently with a #3 curette there is minimal bleeding. We had been using iodoflex under a total contact cast. I am going to take him out of the cast and change him to [place] today which his wife will change daily. 4/6; wound bed looks better. Still undermining medially. Some maceration and erythema around the wound. We have been using Santyl change daily. I took him out of a total contact cast last week 4/13; patient's culture from last time grew MSSA. The wound is larger not a very viable surface. Duplex ultrasound I did last week rule out DVT was negative. He still has a fair amount of pitting edema in the lower leg and dorsal foot but I am not really certain why. I started him on Keflex 500 every 6 for 10 days starting yesterday His wife tells me that Dr. Andree Elk office in [place] has been in contact they want to look at restudying I believe to see if there is further procedures planned. I told his wife he still has a feeble dorsalis  pedis pulse certainly not as robust as after the last procedure although this could be partially  because of edema 4/20; he has completed his antibiotics 2 weeks worth of Keflex for MSSA. He has necrotic material over a large portion of this wound. The swelling in his leg is come down presumably resolved with treatment of the infection. He will have a arterial ultrasound by Dr. Andree Elk on Friday and apparently they see him later that day. We are using Bactroban and silver alginate 4/27 patient had his arterial Dopplers ABIs and TBI's done by Dr. Andree Elk. He was noted to have a moderate to significant stenosis involving the left proximal superficial artery consistent with a 50 to 69% diameter reduction. The left anterior tibial artery including the dorsalis pedis was patent. There was no flow in the distal posterior tibial artery monophasic flow within the distal peroneal artery. The great toe index was 0.32 his absolute great toe pressure was 38 arterial flow was detected by PPG within all 5 digits. He is due to have angiography again on 5/13 Dr. Andree Elk put him back on 2 weeks worth of Keflex. The wound has made absolutely no progress. Interestingly it is more centered over the lateral part of his foot started more on the plantar aspect. 5/4; he is going for his procedure a week Thursday. We will see him back next week he is using Santyl with calcium alginate. His MRI is not toe the 17th. Objective Constitutional Sitting or standing Blood Pressure is within target range for patient.. Pulse regular and within target range for patient.Marland Kitchen Respirations regular, non-labored and within target range.. Temperature is normal and within the target range for the patient.Marland Kitchen Appears in no distress. Vitals Time Taken: 12:38 PM, Height: 69 in, Source: Stated, Weight: 210 lbs, Source: Stated, BMI: 31, Temperature: 98.3 F, Pulse: 81 bpm, Respiratory Rate: 18 breaths/min, Blood Pressure: 119/66 mmHg, Capillary Blood Glucose: 110 mg/dl. General Notes: glucose per pt report this am General Notes: Wound exam; the  surface lightly covered and necrotic tissue. I was able to remove some of this from the circumference and get viable tissue with some blood flow. The rest of this has nothing but probably tendon over the MTP. There is no clear evidence of surrounding infection Integumentary (Hair, Skin) Wound #5 status is Open. Original cause of wound was Gradually Appeared. The wound is located on the Left Metatarsal head fifth. The wound measures 1.9cm length x 1.5cm width x 0.4cm depth; 2.238cm^2 area and 0.895cm^3 volume. There is bone, tendon, and Fat Layer (Subcutaneous Tissue) Exposed exposed. There is no tunneling noted, however, there is undermining starting at 2:00 and ending at 4:00 with a maximum distance of 0.6cm. There is a medium amount of serosanguineous drainage noted. The wound margin is well defined and not attached to the wound base. There is no granulation within the wound bed. There is a large (67-100%) amount of necrotic tissue within the wound bed including Adherent Slough. Assessment Active Problems ICD-10 Type 2 diabetes mellitus with foot ulcer Type 2 diabetes mellitus with diabetic peripheral angiopathy without gangrene Non-pressure chronic ulcer of other part of left foot with other specified severity Procedures Wound #5 Pre-procedure diagnosis of Wound #5 is a Diabetic Wound/Ulcer of the Lower Extremity located on the Left Metatarsal head fifth .Severity of Tissue Pre Debridement is: Fat layer exposed. There was a Excisional Skin/Subcutaneous Tissue Debridement with a total area of 2.85 sq cm performed by Ricard Dillon., MD. With the following instrument(s): Curette to  remove Viable and Non-Viable tissue/material. Material removed includes Subcutaneous Tissue, Slough, Skin: Dermis, and Skin: Epidermis after achieving pain control using Lidocaine 5% topical ointment. No specimens were taken. A time out was conducted at 13:09, prior to the start of the procedure. A Moderate amount  of bleeding was controlled with Pressure. The procedure was tolerated well with a pain level of 0 throughout and a pain level of 0 following the procedure. Post Debridement Measurements: 1.9cm length x 1.5cm width x 0.4cm depth; 0.895cm^3 volume. Character of Wound/Ulcer Post Debridement is improved. Severity of Tissue Post Debridement is: Fat layer exposed. Post procedure Diagnosis Wound #5: Same as Pre-Procedure Plan Follow-up Appointments: Return Appointment in 1 week. Dressing Change Frequency: Wound #5 Left Metatarsal head fifth: Change dressing every day. Wound Cleansing: Wound #5 Left Metatarsal head fifth: May shower and wash wound with soap and water. Primary Wound Dressing: Wound #5 Left Metatarsal head fifth: Calcium Alginate - over santyl Santyl Ointment Other: - pad right great toe for protection Secondary Dressing: Foam - secure with tape Off-Loading: Wedge shoe to: - with felt. apply to left foot. Additional Orders / Instructions: Other: - closely monitor redness. 1. I am continuing with Santyl covered with calcium alginate 2. He has some circumferential tissue that looks healthy hopefully we can maintain this. 3. Revascularization attempts on 5/13 by Dr. Andree Elk. His MRI is not till 5/17 4. I have told him that if he has osteomyelitis the options will be IV antibiotics plus or minus hyperbarics plus or minus amputation. I do not think he could heal a foot conserving surgery attempt Electronic Signature(s) Signed: 03/02/2020 5:25:34 PM By: [name] MD Entered By: [name] on 03/02/2020 13:24:23 -------------------------------------------------------------------------------- SuperBill Details Patient Name: Date of Service: Ileana Roup, Carola Frost YE 03/02/2020 Medical Record Number: [id number] Patient Account Number: [id number] Date of Birth/Sex: Treating RN: 10-28-1946 (73 y.o. 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 Facility Procedures The patient participates with Medicare or their insurance follows the Medicare Facility Guidelines: CPT4 Code Description Modifier Quantity [id number] - DEB SUBQ TISSUE 20 SQ CM/< 1 ICD-10 Diagnosis Description L97.528 Non-pressure chronic ulcer of  other part of left foot with other specified severity Physician Procedures : CPT4 Code Description Modifier [id number] - WC PHYS SUBQ TISS 20 SQ CM ICD-10 Diagnosis Description L97.528 Non-pressure chronic ulcer of other part of left foot with other specified severity Quantity: 1 Electronic Signature(s) Signed: 03/02/2020 5:25:34 PM By: [name] MD Entered By: [name] on 03/02/2020 13:24:39

## 2020-03-09 ENCOUNTER — Other Ambulatory Visit: Payer: Self-pay

## 2020-03-09 ENCOUNTER — Encounter (HOSPITAL_BASED_OUTPATIENT_CLINIC_OR_DEPARTMENT_OTHER): Payer: Medicare Other | Admitting: Internal Medicine

## 2020-03-09 DIAGNOSIS — E11621 Type 2 diabetes mellitus with foot ulcer: Secondary | ICD-10-CM | POA: Diagnosis not present

## 2020-03-09 NOTE — Progress Notes (Signed)
ADEL, BURCH ([id number]) Visit Report for 03/09/2020 HPI Details Patient Name: Date of Service: RO [name] 03/09/2020 12:30 PM Medical Record Number: [id number] Patient Account Number: [id number] Date of Birth/Sex: Treating RN: 1946-04-20 (73 y.o. [name]) [name] Primary Care Provider: [name] Other Clinician: Referring Provider: Treating Provider/Extender: Leonette Nutting in Treatment: 22 History of Present Illness HPI Description: 05/18/16; this is a 74year-old diabetic who is a type II diabetic on insulin. The history is that he traumatized his right foot developed a sore sometime in late March. Shortly thereafter he went on a cruise but he had to get off the cruise ship in [place] and fly urgently back to [place] where he was admitted to [facility] and ultimately underwent a transmetatarsal amputation by Dr. 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 According to the patient and his wife this wound never really healed. He was seen on 2 occasions in the wound care center in [place] and had vascular studies and then was referred urgently to Dr. Bridgett Larsson of vascular surgery. He underwent an angiogram on 05/10/16. Unfortunately nothing really could be done to improve his vascular status. He had a 75-90% stenosis in the midsegment of 1 segment of the posterior femoral artery. He had a patent popliteal, his anterior tibial occluded shortly after takeoff. Perineal had a greater than 90% stenosis posterior tibial is occluded feet had no distal collaterals feed distal aspect of the transmetatarsal amputation site. The patient tells me that he had a prolonged period of Santyl by Dr. Doran Durand was some initial improvement but then this was stopped. I think they're only applying daily dressings/dry dressings. He has not had a recent x-ray of the right foot he did have one before his surgery in April. His wife by the dimensions of the wound/surgical site being followed at  home since 4/20. At that point the dimensions were 0.5 x 12 x 0.2 on 7/19 this was 1.8 x 6 x 0.4. He is not currently on any antibiotics. His hemoglobin A1c in early April was 12.9 at that point he was started on insulin. Apparently his blood sugars are much lower he has an appointment with Dr. Legrand Como Alteimer of endocrine next week. 05/29/16 x-ray of the area did not show osteomyelitis. I think he probably needs an MRI at this point. His wife is asking about something called"Yireh" cream which is not FDA approved. I have not heard of this. 06/22/16; MRI did not really suggest osteomyelitis. There was minimal marrow edema and enhancement in the stump of the second metatarsal felt to be secondary likely to postoperative change rather than osteomyelitis. The patient has arranged his own consultation with Dr. Andree Elk at [place], apparently their daughter lives in [place] and has some connection here. Any improvement in vascular supply by Dr. Andree Elk would of course be helpful. Dr. Bridgett Larsson did not feel that anything further could be done other than amputation if wound care did not result in healing or if the area deteriorates. 06/26/16; the patient has been to see Dr. Andree Elk at [place] and had an angiogram. He is going for a procedure on Thursday which will involve catheterization. I'm not sure if this is an anterograde or retrograde approach. He has been using Santyl to the wound 07/10/16; the patient had a repeat angiogram and angioplasty at [facility] by Dr. Brunetta Jeans. His angiogram showed right CFA and profundal widely patent. The right as of a.m. popliteal artery were widely patent the right anterior tibial was occluded  proximally and reconstitutes at the ankle. Peroneal artery was patent to the foot. Posterior tibial artery was occluded. The patient had angioplasty of the anterior tibial artery.. This was quite successful. He was recommended for Plavix as well as aspirin. 07/17/16; the patient was close to be a nurse visit  today however the outer dressing of the Apligraf fell off. Noted drainage. I was asked to see the wound. The patient is noted an odor however his wife had noted that. Drainage with Apligraf not necessarily a bad thing. He has not been systemically unwell 07/24/16; we are still have an issue with drainage of this wound. In spite of this I applied his second Apligraf. Medially the area still is probing to bone. 08/07/16; Apligraf reapplied in general wound looks improved. 08/21/16 Apligraf #4. Wound looks much better 09/04/16 patientt's wound again today continues to appear to improve with the application of the Apligraf's. He notes no increased discomfort or concerns at this point in time. 09/18/16; the patient returns today 2 weeks after his fifth application of Apligraf. Predictably three quarters of the width of this wound has healed. The deep area that probe to bone medially is still open. The patient asked how much out-of-pocket dollars would be for additional Apligraf's. 09/25/16; now using Hydrofera Blue. He has completed 5 Apligraf applications with considerable improvement in this deep open transmetatarsal amputation site. His wound is now a triangular-shaped wound on the medial aspect. At roughly 12 to 2:00 this probes another centimeter but as opposed to in the past this does not probe to bone. The patient has been seen at [place] by Dr. Zenia Resides. He is not planning to do any more revascularization unless the wound stalls or worsens per the patient 10/02/16; 0.7 x 0.8 x 0.8. Unfortunately although the wound looks stable to improved. There is now easily probable bone. This hasn't been present for several weeks. Patient is not otherwise symptomatic he is not experiencing any pain. I did a culture of the wound bed 10/09/16. Deterioration last week. Culture grew MRSA and although there is improvement here with doxycycline prescribed over the phone I'm going to try to get him linezolid 600 twice a day for  10 days today. 10/16/16; he is completing a weeks worth of linezolid and still has 3 more days to go. Small triangular-shaped open area with some degree of undermining. There is still palpable bone with a curet. Overall the area appears better than last week 10/20/16 he has completed the linezolid still having some nausea and vomiting but no diarrhea. He has exposed bone this week which is a deterioration. 10/27/16 patient now has a small but probing wound down to bone. Culture of this bone that I did last week showed a few methicillin-resistant staph aureus. I have little doubt that this represents acute/subacute osteomyelitis. The patient is currently on Doxy which I will continue he also completed 10 days of linezolid. We are now in a difficult situation with this patient's foot after considerable discussion we will send him back to see Dr. Doran Durand for a surgical opinion of this I'm also going to try to arrange a infectious disease consult at [facility] hopefully week and get this prior to her usual 4-6 weeks we having [place]. The patient clearly is going to need 6 weeks of IV vancomycin. If we cannot arrange this expediently I'll have to consider ordering this myself through a home infusion company 11/03/16; the patient now has a small in terms of circumference but probing wound. No  bone palpable today. The patient remains on doxycycline 100 twice a day which should support him until he sees infectious disease at [facility] next week the following week on Wednesday I believe he has an appointment with Dr. Andree Elk at [facility] who is his vascular cardiologist. Finally he has an appointment with Dr. Doran Durand on 11/22/16 we have been using silver alginate. The patient's wife states they are having trouble getting this through [facility] 11/13/16; the patient was seen by infectious disease at [facility] in the 11th PICC line placed in preparation for IV antibiotics. A tummy he has not going to get IV vancomycin o  Ceftaroline. They've also ordered an MRI. Patient has a follow-up with Dr. Doran Durand on 11/22/16 and Dr. Andree Elk at [facility] tomorrow 11/23/16 the patient is on daptomycin as directed by infectious disease at [facility]. He is also been back to see Dr. Andree Elk at [facility]. He underwent a repeat arteriogram. He had a successful PTA of the right anterior tibial artery. He is on dual antiplatelete treatment with Plavix and aspirin. Finally he had the MRI of his foot in [place]. This showed cellulitis about the foot worse distally edema and enhancement in the reminiscent of the second metatarsal was consistent with osteomyelitis therefore what I was assuming to be the first metatarsal may be actually the second. He also has a fluid collection deep to the calcaneus at the level of the calcaneal spur which could be an abscess or due to adventitial bursitis. He had a small tear in his Achilles 11/30/16; the patient continues on daptomycin as directed by infectious disease at [facility]. He is been revascularized by Dr. Andree Elk at [facility] in [place]. He has been to see Gretta Arab who was the orthopedic surgeon who did his original amputation. I have not seen his not however per the patient's wife he did not offer another surgical local surgical prodecure to remove involved bone. He verbalized his usual disbelief in not just hyperbarics but any medical therapy for this condition(osteomyelitis). I discussed this in detail with the patient today including answering the question about a BKA definitively "curing" the current condition. 12/07/16; the patient continues on daptomycin as directed by infectious disease at [facility]. This is directed at the MRSA that we cultured from his bone debridement from 12/22. Lab work today shows a white count of 8.7 hemoglobin of 10.5 which is microcytic and hypochromic differential count shows a slightly elevated monocyte count at 1.2 eosinophilic count of 0.6. His creatinine is 1.11  sedimentation rate apparently is gone from 35-34 now 40. I explained was wife I don't think this represents a trend. His total CK is 48 12/14/16- patient is here for follow-up evaluation of his right TMA site. He continues to receive IV daptomycin per infectious disease. His serum inflammatory markers remain elevated. He complains of intermittent pain to the medial aspect of the TMA site with intermittent erythema. He voices no complaints or concerns regarding hyperbaric therapy. Overall he and his wife are expressing a frustration and discouragement regarrding the length of time of treatment. 12/21/16; small open wound at roughly the first or second metatarsal metatarsalphalyngeal joint reminiscence of his transmetatarsal amputation site he continues to receive IV daptomycin per infectious disease at [facility]. He will finish these a week tomorrow. He has lab work which I been copied on. His white count is 10.8 hemoglobin 8.9 MCV is low at 75., MCH low at 24.5 platelet count slightly elevated at 626. Differential count shows 70% neutrophils 10% monocytes  and 10% eosinophils. His comprehensive metabolic panel shows a slightly low sodium at 133 albumin low at 3.1 total CK is normal at 42 sedimentation rate is much higher at 82. He has had iron studies that show a serum iron of 15 and iron binding capacity of 238 and iron saturation of 6. This is suggestive of iron deficiency. B12 and folate were normal ferritin at 113 The patient tells me that he is not eating well and he has lost weight. He feels episodically nauseated. He is coughing and gagging on mucus which she thinks is sinusitis. He has an appointment with his primary doctor at 5:00 this afternoon in [facility] 01/02/17; the patient developed a subacute pneumonitis. He was admitted to [facility] after a CT scan showed an extensive interstitial pneumonitis [I have not yet seen this]. 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 He has since been discharged. He is not on oxygen. He feels fatigued and very short of breath with exertion. At [facility] the wound care nurse there felt that his wound was healed. He did complete his daptomycin and has follow-up with infectious disease on Friday. X-rays I did before he went to [facility] still suggested residual osteomyelitis in the anterior aspect of the second must metatarsal head. I'm not sure I would've expected any different. There was no other findings. I actually think I did this because of erythema over the first metatarsal head reminiscent 01/11/17; the patient has eosinophilic pneumonitis. He has been reviewed by pulmonology and given clearance for hyperbarics at least that's what his wife says. I'll need to see if there is note in care everywhere. Apparently the prognosis for improvement of daptomycin induced eosinophilic granulocyte is is 3 months without steroids. In the meantime infectious disease has placed him on doxycycline until the wound is closed. He is still is lost a lot of weight and his blood sugars are running in the mid 60s to low 80s fasting and at all times during the day 01/18/17; he has had adjustments in his insulin apparently his blood sugars in the morning or over 100. He wants to restart his hyperbaric treatment we'll do this at 1:00. 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 I think there is good reason to complete his treatments in order to give him the best chance of maintaining a healed status and these DFU 3 wounds with MRSA infection in the bone 02/15/17; the patient was seen today in conjunction with HBO. He completed hyperbaric oxygen today. The open area on his transmetatarsal site has remained closed. There was an area of erythema when I saw him earlier in the week on the posterior  heel although that is resolved as of today as well. He has been using a cam walker. This is a patient who came to [place] after a transmetatarsal amputation that was necrotic and dehisced. He required revascularization percutaneously on 2 different occasions by Dr. Andree Elk of invasive cardiology at [facility]. 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 He went to [facility] infectious disease and had IV daptomycin for 5 weeks before developing eosinophilic pneumonitis and requiring an admission to hospital/ICU. He made a good recovery and is continued on doxycycline since. As mentioned his foot is closed now. He has a follow-up with Dr. Andree Elk  tomorrow. He is going to [employer] on Monday for a custom-made shoe READMISSION Last visit Dr. Andree Elk [facility] 02/16/17 1. Critical limb ischemia of the RLE: s/p transmetatarsal amputation of the RLE, now completely healed. s/p right ATA percutaneous revascularization procedure x2, most recently 11/20/2016 s/p PTA of right AT 100% to less than 20% with a 2.5 x 200 balloon. He does have some residual osteomyelitis, but given the wound is closed, the orthopedist has recommended to follow. He has a fitting for a special shoe coming up next week. He has completed a course of daptomycin and doxycycline and he has been discharged by ID. He has completed a course of hyperbaric therapy. Continue Continue medical management with aspirin, Plavix and statin therapy. We will discuss ongoing Plavix therapy at follow-up in 6 months. 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 Given no symptoms, we will conservatively manage. He doesn't want to do any more invasive studies at this time, which is reasonable. The patient arrives today out of 2 concerns both on the  right transmetatarsal site. 1 at the level of the reminiscent fifth metatarsal head and the other at roughly the first or second. Both of these look like dark subcutaneous discoloration probably subdermal bleeding. He is recently obtained new adaptive footwear for the right foot. He also has a callus on the left fifth dorsal toe however he follows with podiatry for this and I don't think this is any issue. ABIs in this clinic today were 0.66 on the right and 1.06 on the left. On entrance into our clinic initially this was 0.95 and 0.92. He does not describe current claudication. They're going away on a cruise in 8 weeks and I think are trying to do the month is much as they can proactively. They follow with podiatry and have an appointment with Dr. Andree Elk in October READMISSION 06/25/18 This is a patient that we have not seen in almost a year. He is a type II diabetic with known PAD. He is followed by Dr. Andree Elk of interventional cardiology at [facility] in [place]. He is required revascularization for significant PAD. When we first saw him he required a transmetatarsal amputation. He had underlying osteomyelitis with a nonhealing surgical wound. This eventually closed with wound care, IV antibiotics and hyperbaric oxygen. They tell me that he has a modified shoe and he is very active walking up to 4 miles a day. He is followed by Dr. Geroge Baseman of podiatry. His wife states that he underwent a removal of callus over this site on July 9. She felt there may be drainage from this site after that although she could never really determined and there was callus buildup again. On 06/01/18 there was pressure bleeding through the overlying callus. he was given a prescription for 7 days of Bactrim. Fortuitously he has an appointment with Dr. Andree Elk on 06/04/18 and he immediately underwent revascularization of the right leg although I have not had a chance to review these records in care everywhere. This was apparently  done through anterior and retrograde access. On 06/06/18 he had another debridement by Dr. Bernette Mayers. Vitamin soaking with Epsom salts for 20 minutes and applying calcium alginate. The original trans-met was on April 2017 I believe by Dr. Doran Durand. His ABI in our clinic was noncompressible today. 07/02/18; x-ray I ordered last week was negative for osteomyelitis. Swab culture was also negative.  He is going to require an MRI which I have ordered today. 07/09/18; surprisingly the MRI of the foot that I ordered did not show osteomyelitis. He did suggest the possibility of cellulitis. For this reason I'll go ahead and give him a 10 day course of doxycycline. Although the previous culture of this area was negative 07/16/18; it arrives with the wound looking much the same. Roughly the same depth. He has thick subcutaneous tissue around the wound orifice but this still has roughly the same depth. Been using silver alginate. We applied Oasis #1 today 07/23/2018; still having to remove a lot of callus and thick subcutaneous tissue to actually define the wound here. Most of this seems to have closed down yet he has a comma shaped divot over the top of the area that still I think is open. We applied Oasis #2 His wife expressed concern about the tip of his left great toe. This almost looks like a small blister. She also showed it today to her podiatrist Dr. Geroge Baseman who did not think this was anything serious. I am not sure is anything serious either however I think it bears some watching. He is not in any pain however he is insensate 07/30/2018;; still on a lot of nonviable tissue over the surface of the wound however cleaning this up reveals a more substantial wound orifice but less of a probing wound depth. This is not probed to bone. There is no evidence of infection The wife is still concerned about a non-open area on the tip of his left great toe. Almost feels like a bony outgrowth. She had previously showed this  to podiatry. I do not think this is a blister. A friction area would be possible although he is really not walking according to his wife. He had a small skin tag on his right buttock but no open wound here either 08/06/2018; we applied a third Oasis last week. Unfortunately there is really no improvement. Still requiring extensive debridement to expose the wound bed from a horizontal slitlike depression. I still have not been able to get this to fill in properly. On the positive side there is now no probable bone from when he first came into the facility. I changed him to silver alginate today after a reasonably aggressive debridement 08/13/2018; once again the patient comes in with skin and subcutaneous tissue closing over the small probing area with the underlying cavity of the wound on the right TMA site. I applied silver alginate to this last week. Prior to that we used Oasis x3 still not able to get this area granulating. He does not have a probing area of the bone which is an improvement from when I spur started working on this and an MRI did not suggest osteomyelitis. I do not see evidence of infection here but I am increasingly concerned about why I cannot get this area to granulate. Each time I debrided this is looks like this is simply a matter of getting granulation to fill in the hole and then getting epithelialization. This does not seem to happen Also I sent him back to see podiatry Dr. Earleen Newport about the what felt to be bony outgrowth on the tip of his left great toe. Apparently after heel he left the clinic last week or the next day he developed a blood blister. He did see Dr. Earleen Newport. He went on to have a debridement of the medial nail cuticle he now has an open area here as well as  some denuded skin. They did an x-ray apparently does have a bony outgrowth or spur but I am not able to look at this. They are using topical antibiotics apparently there was some suggested he use Santyl.  Patient's wife was anxious for my opinion of this 08/20/2018; we are able to keep the wound open this time instead of the thick subcutaneous tissue closing over the top of it however unfortunately once again this probes to bone. I do not see any evidence of infection and previous MRI did not show osteomyelitis. I elected to go back to the Oasis to see if we can stimulate some granulation. Last saw his vascular interventional cardiologist Dr. Andree Elk at the beginning of August and he had a repeat procedure. Nevertheless I wonder how much blood flow he has down to this area With regards to the left first toe he is seeing Dr. Earleen Newport next week. They are applying Bactroban to this area. 08/27/2018 Once again he comes in with thick eschar and subcutaneous tissue over the top of the small probing hole. This does not appear to go down to bone but it still has roughly the same depth. I reapplied Oasis today Over the left great toe there appears to be more of the wound at the tip of his toe than there was last week. This has an eschar on the surface of it. Will change to Santyl. There is seeing podiatry this afternoon 09/03/2018 He comes in today with the area on the transmetatarsal's site with a fair amount of callus, nonviable tissue over the circumference but it was not closed. I removed all of this as well as some subcutaneous debris and reapplied Oasis. There is no exposed bone The area over the tip of the left great toe started off as a nodule of uncertain etiology. He has been followed with podiatry. They have been removing part of the medial nail bed. He has nonviable tissue over the wound he been using Santyl in this area 09/10/18 Unfortunately comes in with neither wound area looking improved. The transmetatarsal amputation site once Again has nonviable debris over the surface requiring debridement. Unfortunately underneath this there is nothing that looks viable and this once again goes right down to  bone. There is no purulent drainage and no erythema. Also the surgical wound from podiatry on the left first toe has an ischemic-looking eschar over the surface of the tip of the toe I have elected not to attempt candidly debride this His wife as arranged for him to have follow-up noninvasive studies in [place] under the care of Dr. Andree Elk clinic. The question is he has known severe PAD. They had recently seen him in August. He has had revascularizations in both legs within the last 4 or 5 months. He is not complaining of pain and I cannot really get a history of claudication. He has not systemically unwell 09/20/2018 the patient has been to [facility] and been revascularized by Dr. Andree Elk earlier this week. Apparently he was able to open up the anterior tibial artery although I have not actually seen his formal report. He is going for an attempt to revascularize on the left on Monday. He is apparently working with an investigational stent for lower extremity arteries below the knee and he has talked to the patient about placing that on Monday if possible. The patient has been using silver alginate on the transmetatarsal amputation site and Santyl on the left 09/30/2018; patient had his revascularization on the left this apparently  included a standard approach as well as a more distal arterial catheterization although I do not have any information on this from Dr. Andree Elk. In fact I do not even see the initial revascularization that he had on the right. I have included the arterial history from Dr. Andree Elk last note however below; ASSESSMENT/PLAN: 1. Hx of Critical limb ischemia bilateral lower extremities, PAD: -s/p transmetatarsal amputation of the RLE. -s/p right ATA percutaneous revascularization procedure x2, most recently 11/20/2016 s/p PTA of right AT 100% to less than 20% with a 2.5 x 200 balloon. -On original angiogram 05/15/2016, he had a significant 70-95% left popliteal artery stenosis with  AT and PT artery occlusions and one vessel runoff via the peroneal artery. -03/21/2018 s/p PTA of 90% left popliteal artery to <10% with a 5x20 cutting balloon, PTA of 90% left peroneal to <20% with a 3x20 balloon, PTA of 100% left AT to <20% with a 2.5x220 balloon. 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 Will plan on the LLE soon after. Risks/benefits of procedure have been discussed and patient has elected to proceed. We have been using endoform to the right TMA amputation site wound and Santyl to the left great toe 10/07/2018; the area on the tip of his left great toe looked better we have been using Santyl here. We continue to have a very difficult probing hole on the right TMA amputation site we have been using endoform. I went on to use his fifth Oasis today 10/14/2018; the tip of the left great toe continues to look better. We have been using Santyl here the surface however is healthy and I think we can change to an alginate. The right TMA has not changed. Once again he has no superficial opening there is callus and thick subcutaneous tissue over the orifice once you remove this there is the probing area that we have been dealing with without too much change. There is no palpable bone I have been placing Oasis here and put Oasis #6 in this today after a more vigorous debridement 10/21/18; the left great toe still has necrotic surface requiring debridement. The right TMA site hasn't changed in view of the thick callus over the wound bed. With removal of this there is still the opening however this does not appear to have the same depth. Again there is no palpable bone. Oasis was replaced 10/28/2018; patient comes in with both wounds looking worse. The area over the first toe tip is now down to bone. The area over the TMA site is deeper and down to  bone clearly with a increase in overall wound area. Equally concerning on the right TMA is the complete absence of a pulse this week which is a change. We are not even able to Doppler this. On the right he has a noncompressible ABI greater than 1.4 11/04/2018. Both wounds look somewhat worse. X-rays showed no osteomyelitis of the right foot but on the left there was underlying osteomyelitis in the left great toe distal phalanx. I been on the phone to Dr. Andree Elk surface at [facility] in [place] and they are arranging for another angiogram on the right on Monday. I have him on doxycycline for the osteomyelitis in the left great toe for now. Infectious disease may be necessary 1/17; 2-week hiatus. Patient was admitted to hospital at [address]. My understanding is he  underwent an angioplasty of the right anterior tibial artery and had stents placed in the left anterior artery and the left tibial peroneal trunk. This was done by Dr. Andree Elk of interventional radiology. There is no major change in either 1 of the wounds. They have been using Aquacel Ag. As far as they are aware no imaging studies were done of the foot which is indeed unfortunate. I had him on doxycycline for 2 weeks since we identified the osteomyelitis in the left great toe by plain x-ray. I have renewed that again today. I am still suspicious about osteomyelitis in the amputation site and would consider doing another MRI to compare with the one done in September. The idea of hyperbaric oxygen certainly comes up for discussion 1/24; no major change in either wound area. I have him on doxycycline for osteomyelitis at the tip of the left great toe. As noted he has been previously and recently revascularized by Dr. Andree Elk at [place]. He is tolerating the doxycycline well. For some reason we do not have an infectious disease consult yet. Culture of drainage from the right foot site last week was negative 1/31; MRI of the right foot did not show  osteomyelitis of the right ankle and foot. Notable for a skin ulceration overlying the second metatarsal stump with generalizing soft tissue edema of the ankle and foot consistent with cellulitis. Noted to have a partial-thickness tear of the Achilles tendon 7.5 cm proximal to the insertion Nothing really new in terms of symptoms. Patient's area on the tip of the left great toe is just about closed although he still has the probing area on the metatarsal amputation site. Appointment with Dr. Linus Salmons of infectious disease next week 2/7; Dr. Novella Olive did not feel that any further antibiotics were necessary he would follow-up in 2 months. The left great toe appears to be closed still some surface callus that I gently looked under high did not see anything open or anything that was threatening to be open. He still has the open area on the mid part of his TMA site using endoform 2/14; left great toe is closed and he is completing his doxycycline as of last Sunday. He is not on any antibiotics. Unfortunately out of the right foot his wife noticed some subdermal hemorrhage this week. He had been walking 2 miles I had given him permission to do so this is not really a plantar wound. Using endoform to this wound but I changed to silver alginate this week 2/21; left great toe remains closed. Culture last week grew Streptococcus angiosis which I am not really familiar with however it is penicillin sensitive and I am going to put him on Augmentin. Not much change in the wound on the right foot the deep area is still probing precariously close to bone and the wound on the margin of the TMA is larger. 2/28; left great toe remains closed. He is completing the Augmentin I gave him last week. 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 This is being shown to Dr. Andree Elk at [place] to see if there is anything else that can be done here. He has been using silver alginate strips on the  right 3/6; left great toe remains closed. He sees Dr. Andree Elk on Monday. The area on the plantar aspect of the right foot has the same small orifice with thick callused tissue around this. However this time with removal of the callus tissue the wound is open all  the way along the incision line to the end medially. We have been using silver alginate. 3/13; left toe remains closed although the area is callused. He sees Dr. Jacqualyn Posey of podiatry next week. The area on the plantar right foot looked a lot better this week. Culture I did of this was negative we use silver alginate. He is going next week for an attempt at revascularization by Dr. Andree Elk 3/23; left toe remains closed although the area is callused. He will see Dr. Jacqualyn Posey in follow-up. The area on the right plantar foot continues to look surprisingly better over the last 3 visits. We have been using silver alginate. The revascularization he was supposed to have by Dr. Andree Elk at [facility] in [place] has been canceled [facility] procedure] 4/6; the right foot remains closed albeit callused. Podiatry canceled the appointment with regards to the left great toe. He has not seen Dr. Andree Elk at [facility] but thinks that Dr. Andree Elk has "done all he can do". He would be a candidate for the hemostaemix trial Readmission 07/29/2019 Mr. Stann Mainland is a man we know well from at least 3 previous stays in this clinic. He is a type II diabetic with severe PAD followed by Dr. Andree Elk at [facility] in [place]. 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 So far everything in both of these areas has remained closed. About 2 weeks ago he went to see his podiatrist at [facility] Dr. Babs Bertin. He had a thick callus on the left fifth metatarsal head that was shaved. He has developed an open wound in this area. They have been offloading this in  his diabetic shoes. The patient has not had any more revascularizations by Dr. Andree Elk since the last time he was here. ABI in our clinic at the posterior tibial on the left was 1.06 10/6; no real change in the area on the plantar met head. Small wound with 2 mm of depth. He has thick skin probably from pressure around the wound. We have been using silver alginate 10/13; small wound in the left fifth plantar met head. Arrives today with undermining laterally and purulent drainage. Our intake nurse cultured this. His wife stated they noticed a change in color over the last day or 2. He is not systemically unwell 10/19; small wound on the fifth plantar metatarsal head. Culture I did last week showed Staphylococcus lugdunensis. Although this could be a skin contaminant the possibility of a skin and soft tissue infection was there. I did give him empiric doxycycline which should have covered this. We are using silver alginate to the wound. We put him in a total contact cast today. 10/22; small wound on the fifth plantar metatarsal head. He has completed antibiotics. He also has severe PAD which worries me about just about any wound on this man's foot 10/29; small superficial area on the fifth plantar metatarsal head. Measuring slightly smaller. He is not currently on any antibiotics. I been using a total contact cast in this man with very severe PAD but he seems to be tolerating this well 11/5; left plantar fifth metatarsal head. Silver alginate being used under a total contact cast 11/12; wound not much different than last week. Using a #15 scalpel debridement around the wound. Still silver collagen under a total contact cast. He has severe PAD and that may be  playing a role in this 11/19; disappointing that the wound is not really changed that much. I think it is come down in overall surface area because originally this was on the lateral part of the fifth metatarsal head however recently it is not  really changed. Most of this is filled in but it will not epithelialized there is surface debris on this which may be mostly related to ischemia. They have an appointment with Dr. Andree Elk on 12/9 09/24/2019 on evaluation today patient appears to be doing somewhat better with regard to the wound on the left fifth metatarsal head. Fortunately there does not appear to be any signs of active infection at this time. No fevers, chills, nausea, vomiting, or diarrhea. The wound does not appear to be completely closed and I do feel like the Hydrofera Blue is helping to some degree although I feel like the cast as well as keeping things from breaking down or getting any larger at least. As far as his wound overview it shows that the overall size of the wound is slightly smaller but really maintaining within the realm of about the same. He had no troubles with the cast which is good news. 12/1; left fifth metatarsal head. Perhaps somewhat more vibrant. We have been using Hydrofera Blue. He sees Dr. Andree Elk at [place] 1 week tomorrow. He be back next week and I hope to have a better idea which way the wound is going however I will not cast him next week in case Dr. Andree Elk wants to reevaluate things in his foot. The left leg is the leg with the stent in place and I wonder whether these are going to have to be reevaluated. 12/8; left fifth metatarsal head. Quite a bit better this week. Only a small open area remains. He sees Dr. Andree Elk at [place] tomorrow. Dr. Andree Elk is previously done his vascular interventions. He has 2 stents in the left leg as I remember things. I therefore will not put him in a total contact cast today. He will be using a forefoot off loader. We will use silver collagen on the wound. We will see him again next week 12/15; left fifth metatarsal head. He saw Dr. Andree Elk and is scheduled for an angiogram on Thursday. Unfortunately although his wound was a lot better last week it is really deteriorated this week.  Small punched-out hole with depth and overhanging tissue. We have been using Hydrofera Blue 12/22; patient had an 80% stenosis proximal to the stent I believe in the below-knee popliteal artery. This was opened by Dr. Andree Elk. According the patient the stent itself was satisfactory. I have not been able to review this note. 12/29; patient arrives today with the wound about the same size some undermining medially. We have been using Hydrofera Blue under a total contact cast and that is what we will do again today 11/04/2019. Slightly smaller in orifice but about 4 mm undermining almost circumferentially. We have been using Hydrofera Blue. Apparently the Hydrofera Blue did not stay on the wound after we removed the cast. Some minor looking cast irritation on the right lateral 1/12; unfortunately things did not go well this week. Arrives in clinic today with a deeper wound with undermining more concerning a area of pus. Specimen was obtained for culture. We are not going to be able to put him in a cast today. 1/19; purulent material last week which I cultured showed Enterobacter cloacae. He was on ampicillin previously prescribed by his primary doctor which should  not have covered this however mysteriously the wound looks somewhat better. There is less depth less erythema certainly no drainage. He will need a course of ciprofloxacin which I will provide today. 1/26; he has completed the ciprofloxacin. I don't think he needs any additional antibiotics. The areas on the left fifth plantar met head. We've been using silver alginate 2/2; comes in today with 0.4 cm of circumferential undermining around a small wound in this area. We have been using silver alginate with a forefoot offloading boot 2/9; again the wound appears larger nonviable surface and with 0.4 cm roughly of circumferential undermining. We have been using silver alginate with a forefoot offloading boot. The wound does not look infected however  his wife is quick to point out about swelling on the dorsal foot. 2/16; not much change. Comes in with a small wound with a nonviable necrotic surface. Again marked undermining that I had totally removed last week. He had a arterial Doppler last week in Dr. Andree Elk office at [facility]. They have not heard these results. They are somewhat frustrated. 2/23; if anything this is worse. Undermining increased depth. Nonviable surface that looks pale. According his wife is TBI on the right was 0.22 although I have not verified this. He is going for an angiogram with Dr. Andree Elk next Monday. We are using polymen and offloading in a forefoot offloading boot. 3/2; the patient was revascularized yesterday by [facility]. He had successful PTA of the left peroneal 60% to less than 20% and successful PTA of the left ATA 100% to less than 20%. He now has a pulse in the left foot Now that he has some additional blood flow there is not a lot of options here. On one hand we want activity to help keep blood flow augmented on the other hand pressure relief is going to be paramount if we have any chance to get this to close. After some discussion with his wife and patient I went ahead and put him in a total contact cast. 3/9; he arrives in clinic today with some debris over the wound surface. We use silver collagen under a total contact cast after his revascularization by Dr. Andree Elk 3/16; wound about the same depth at 0.5 cm. Surface area perhaps slightly less but it is the depth the really is important. We have been using silver collagen 3/23; not much change here. There is still undermining medially the depth is about the same tissue looks somewhat better. We switched to Iodoflex last week to try to get a better looking wound surface. He does have a small abrasion over the proximal interphalangeal joint dorsally. I this is so small it is difficult even to tell whether it is open. Some abrasion between the  fifth toe and the webspace as well 3/30; if anything the wound is deeper this week and larger. Very disappointing. Under illumination debris on the surface which I debrided gently with a #3 curette there is minimal bleeding. We had been using iodoflex under a total contact cast. I am going to take him out of the cast and change him to [place] today which his wife will change daily. 4/6; wound bed looks better. Still undermining medially. Some maceration and erythema around the wound. We have been using Santyl change daily. I took him out of a total contact cast last week 4/13; patient's culture from last time grew MSSA. The wound is larger not a very viable surface. Duplex ultrasound I did last week  rule out DVT was negative. He still has a fair amount of pitting edema in the lower leg and dorsal foot but I am not really certain why. I started him on Keflex 500 every 6 for 10 days starting yesterday His wife tells me that Dr. Andree Elk office in [place] has been in contact they want to look at restudying I believe to see if there is further procedures planned. I told his wife he still has a feeble dorsalis pedis pulse certainly not as robust as after the last procedure although this could be partially because of edema 4/20; he has completed his antibiotics 2 weeks worth of Keflex for MSSA. He has necrotic material over a large portion of this wound. The swelling in his leg is come down presumably resolved with treatment of the infection. He will have a arterial ultrasound by Dr. Andree Elk on Friday and apparently they see him later that day. We are using Bactroban and silver alginate 4/27 patient had his arterial Dopplers ABIs and TBI's done by Dr. Andree Elk. He was noted to have a moderate to significant stenosis involving the left proximal superficial artery consistent with a 50 to 69% diameter reduction. The left anterior tibial artery including the dorsalis pedis was patent. There was no flow in the distal  posterior tibial artery monophasic flow within the distal peroneal artery. The great toe index was 0.32 his absolute great toe pressure was 38 arterial flow was detected by PPG within all 5 digits. He is due to have angiography again on 5/13 Dr. Andree Elk put him back on 2 weeks worth of Keflex. The wound has made absolutely no progress. Interestingly it is more centered over the lateral part of his foot started more on the plantar aspect. 5/4; he is going for his procedure a week Thursday. We will see him back next week he is using Santyl with calcium alginate. His MRI is not toe the 17th. 5/11; patient sees Dr. Andree Elk for angiography on Thursday of this week. His MRI is on 5/17. If his MRI is positive for osteomyelitis he will need IV antibiotics and hyperbarics he is prepared for this eventuality. If it is not positive we are going to probably have to try an advanced treatment option Electronic Signature(s) Signed: 03/09/2020 6:13:26 PM By: [name] MD Entered By: [name] on 03/09/2020 13:42:37 -------------------------------------------------------------------------------- Physical Exam Details Patient Name: Date of Service: [name] 03/09/2020 12:30 PM Medical Record Number: [id number] Patient Account Number: [id number] Date of Birth/Sex: Treating RN: [birth year] (73 y.o. [name] Primary Care Provider: [name] Other Clinician: Referring Provider: Treating Provider/Extender: Leonette Nutting in Treatment: 32 Constitutional Sitting or standing Blood Pressure is within target range for patient.. Pulse regular and within target range for patient.Marland Kitchen Respirations regular, non-labored and within target range.. Temperature is normal and within the target range for the patient.Marland Kitchen Appears in no distress. Cardiovascular Pedal pulses are not palpable. Integumentary (Hair, Skin) No erythema around the wound area. Notes Wound exam; there is less  surface debris on this however I think for the most part I am looking at periosteum. There is some granulation around the wound area but it is measuring larger. Electronic Signature(s) Signed: 03/09/2020 6:13:26 PM By: [name] MD Entered By: [name] on 03/09/2020 13:43:37 -------------------------------------------------------------------------------- Physician Orders Details Patient Name: Date of Service: [name], [name] 03/09/2020 12:30 PM Medical Record Number: [id number] Patient Account Number: [id number] Date of Birth/Sex: Treating RN: 06-Jul-1946 (73 y.o. M) Dolores Lory, [place]  Primary Care Provider: [name] Other Clinician: Referring Provider: Treating Provider/Extender: Leonette Nutting in Treatment: [phone] Verbal / Phone Orders: No Diagnosis Coding ICD-10 Coding Code Description E11.621 Type 2 diabetes mellitus with foot ulcer E11.51 Type 2 diabetes mellitus with diabetic peripheral angiopathy without gangrene L97.528 Non-pressure chronic ulcer of other part of left foot with other specified severity Follow-up Appointments Return Appointment in 1 week. Dressing Change Frequency Wound #5 Left Metatarsal head fifth Change dressing every day. Wound Cleansing Wound #5 Left Metatarsal head fifth May shower and wash wound with soap and water. Primary Wound Dressing Wound #5 Left Metatarsal head fifth lginate - over santyl Calcium A Santyl Ointment Other: - pad right great toe for protection Secondary Dressing Foam - secure with tape Off-Loading Open toe surgical shoe to: - with felt. apply to left foot. Additional Orders / Instructions Other: - closely monitor redness. Electronic Signature(s) Signed: 03/09/2020 5:53:12 PM By: [name] RN Signed: 03/09/2020 6:13:26 PM By: [name] MD Entered By: [name] on 03/09/2020 13:32:34 -------------------------------------------------------------------------------- Problem List  Details Patient Name: Date of Service: [name], [name] 03/09/2020 12:30 PM Medical Record Number: [id number] Patient Account Number: [id number] Date of Birth/Sex: Treating RN: July 18, 1946 (73 y.o. [name] Primary Care Provider: [name] Other Clinician: Referring Provider: Treating Provider/Extender: Leonette Nutting in Treatment: 34 Active Problems ICD-10 Encounter Code Description Active Date MDM Diagnosis E11.621 Type 2 diabetes mellitus with foot ulcer 07/29/2019 No Yes E11.51 Type 2 diabetes mellitus with diabetic peripheral angiopathy without gangrene 07/29/2019 No Yes L97.528 Non-pressure chronic ulcer of other part of left foot with other specified 02/24/2020 No Yes severity Inactive Problems ICD-10 Code Description Active Date Inactive Date L97.521 Non-pressure chronic ulcer of other part of left foot limited to breakdown of skin 07/29/2019 07/29/2019 L03.116 Cellulitis of left lower limb 08/12/2019 08/12/2019 Resolved Problems Electronic Signature(s) Signed: 03/09/2020 6:13:26 PM By: [name] MD Entered By: [name] on 03/09/2020 13:40:58 -------------------------------------------------------------------------------- Progress Note Details Patient Name: Date of Service: [name], [name] 03/09/2020 12:30 PM Medical Record Number: [id number] Patient Account Number: [id number] Date of Birth/Sex: Treating RN: [birth year] (73 y.o. [name] Primary Care Provider: [name] Other Clinician: Referring Provider: Treating Provider/Extender: Leonette Nutting in Treatment: 32 Subjective History of Present Illness (HPI) 05/18/16; this is a 74year-old diabetic who is a type II diabetic on insulin. The history is that he traumatized his right foot developed a sore sometime in late March. 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 According to the patient and his wife this wound never really healed. He was seen on 2 occasions in the wound care center in [place] and had vascular studies and then was referred urgently to Dr. Bridgett Larsson of vascular surgery. He underwent an angiogram on 05/10/16. Unfortunately nothing really could be done to improve his vascular status. He had a 75- 90% stenosis in the midsegment of 1 segment of the posterior femoral artery. He had a patent popliteal, his anterior tibial occluded shortly after takeoff. Perineal had a greater than 90% stenosis posterior tibial is occluded feet had no distal collaterals feed distal aspect of the transmetatarsal amputation site. The patient tells me that he had a prolonged period of Santyl by  Dr. Doran Durand was some initial improvement but then this was stopped. I think they're only applying daily dressings/dry dressings. He has not had a recent x-ray of the right foot he did have one before his surgery in April. His wife by the dimensions of the wound/surgical site being followed at home since 4/20. At that point the dimensions were 0.5 x 12 x 0.2 on 7/19 this was 1.8 x 6 x 0.4. He is not currently on any antibiotics. His hemoglobin A1c in early April was 12.9 at that point he was started on insulin. Apparently his blood sugars are much lower he has an appointment with Dr. Legrand Como Alteimer of endocrine next week. 05/29/16 x-ray of the area did not show osteomyelitis. I think he probably needs an MRI at this point. His wife is asking about something called"Yireh" cream which is not FDA approved. I have not heard of this. 06/22/16; MRI did not really suggest osteomyelitis. There was minimal marrow edema and enhancement in the stump of the second metatarsal felt to be secondary likely to postoperative change rather than osteomyelitis. The  patient has arranged his own consultation with Dr. Andree Elk at [place], apparently their daughter lives in [place] and has some connection here. Any improvement in vascular supply by Dr. Andree Elk would of course be helpful. Dr. Bridgett Larsson did not feel that anything further could be done other than amputation if wound care did not result in healing or if the area deteriorates. 06/26/16; the patient has been to see Dr. Andree Elk at [place] and had an angiogram. He is going for a procedure on Thursday which will involve catheterization. I'm not sure if this is an anterograde or retrograde approach. He has been using Santyl to the wound 07/10/16; the patient had a repeat angiogram and angioplasty at [place] by Dr. Brunetta Jeans. His angiogram showed right CFA and profundal widely patent. The right as of a.m. popliteal artery were widely patent the right anterior tibial was occluded proximally and reconstitutes at the ankle. Peroneal artery was patent to the foot. Posterior tibial artery was occluded. The patient had angioplasty of the anterior tibial artery.. This was quite successful. He was recommended for Plavix as well as aspirin. 07/17/16; the patient was close to be a nurse visit today however the outer dressing of the Apligraf fell off. Noted drainage. I was asked to see the wound. The patient is noted an odor however his wife had noted that. Drainage with Apligraf not necessarily a bad thing. He has not been systemically unwell 07/24/16; we are still have an issue with drainage of this wound. In spite of this I applied his second Apligraf. Medially the area still is probing to bone. 08/07/16; Apligraf reapplied in general wound looks improved. 08/21/16 Apligraf #4. Wound looks much better 09/04/16 patientt's wound again today continues to appear to improve with the application of the Apligraf's. He notes no increased discomfort or concerns at this point in time. 09/18/16; the patient returns today 2 weeks after his fifth  application of Apligraf. Predictably three quarters of the width of this wound has healed. The deep area that probe to bone medially is still open. The patient asked how much out-of-pocket dollars would be for additional Apligraf's. 09/25/16; now using Hydrofera Blue. He has completed 5 Apligraf applications with considerable improvement in this deep open transmetatarsal amputation site. His wound is now a triangular-shaped wound on the medial aspect. At roughly 12 to 2:00 this probes another centimeter but as opposed to in the past this  does not probe to bone. The patient has been seen at [facility] by Dr. Zenia Resides. He is not planning to do any more revascularization unless the wound stalls or worsens per the patient 10/02/16; 0.7 x 0.8 x 0.8. Unfortunately although the wound looks stable to improved. There is now easily probable bone. This hasn't been present for several weeks. Patient is not otherwise symptomatic he is not experiencing any pain. I did a culture of the wound bed 10/09/16. Deterioration last week. Culture grew MRSA and although there is improvement here with doxycycline prescribed over the phone I'm going to try to get him linezolid 600 twice a day for 10 days today. 10/16/16; he is completing a weeks worth of linezolid and still has 3 more days to go. Small triangular-shaped open area with some degree of undermining. There is still palpable bone with a curet. Overall the area appears better than last week 10/20/16 he has completed the linezolid still having some nausea and vomiting but no diarrhea. He has exposed bone this week which is a deterioration. 10/27/16 patient now has a small but probing wound down to bone. Culture of this bone that I did last week showed a few methicillin-resistant staph aureus. I have little doubt that this represents acute/subacute osteomyelitis. The patient is currently on Doxy which I will continue he also completed 10 days of linezolid. We are now in a  difficult situation with this patient's foot after considerable discussion we will send him back to see Dr. Doran Durand for a surgical opinion of this I'm also going to try to arrange a infectious disease consult at [facility] hopefully week and get this prior to her usual 4-6 weeks we having Questa. The patient clearly is going to need 6 weeks of IV vancomycin. If we cannot arrange this expediently I'll have to consider ordering this myself through a home infusion company 11/03/16; the patient now has a small in terms of circumference but probing wound. No bone palpable today. The patient remains on doxycycline 100 twice a day which should support him until he sees infectious disease at [facility] next week the following week on Wednesday I believe he has an appointment with Dr. Andree Elk at [facility] who is his vascular cardiologist. Finally he has an appointment with Dr. Doran Durand on 11/22/16 we have been using silver alginate. The patient's wife states they are having trouble getting this through [facility] 11/13/16; the patient was seen by infectious disease at [facility] in the 11th PICC line placed in preparation for IV antibiotics. A tummy he has not going to get IV vancomycin o Ceftaroline. They've also ordered an MRI. Patient has a follow-up with Dr. Doran Durand on 11/22/16 and Dr. Andree Elk at [facility] tomorrow 11/23/16 the patient is on daptomycin as directed by infectious disease at [facility]. He is also been back to see Dr. Andree Elk at [facility]. He underwent a repeat arteriogram. He had a successful PTA of the right anterior tibial artery. He is on dual antiplatelete treatment with Plavix and aspirin. Finally he had the MRI of his foot in [place]. This showed cellulitis about the foot worse distally edema and enhancement in the reminiscent of the second metatarsal was consistent with osteomyelitis therefore what I was assuming to be the first metatarsal may be actually the second. He also has a fluid collection deep to  the calcaneus at the level of the calcaneal spur which could be an abscess or due to adventitial bursitis. He had a small tear in his Achilles 11/30/16;  the patient continues on daptomycin as directed by infectious disease at [facility]. He is been revascularized by Dr. Andree Elk at [facility] in [place]. He has been to see Gretta Arab who was the orthopedic surgeon who did his original amputation. I have not seen his not however per the patient's wife he did not offer another surgical local surgical prodecure to remove involved bone. He verbalized his usual disbelief in not just hyperbarics but any medical therapy for this condition(osteomyelitis). I discussed this in detail with the patient today including answering the question about a BKA definitively "curing" the current condition. 12/07/16; the patient continues on daptomycin as directed by infectious disease at [facility]. This is directed at the MRSA that we cultured from his bone debridement from 12/22. Lab work today shows a white count of 8.7 hemoglobin of 10.5 which is microcytic and hypochromic differential count shows a slightly elevated monocyte count at 1.2 eosinophilic count of 0.6. His creatinine is 1.11 sedimentation rate apparently is gone from 35-34 now 40. I explained was wife I don't think this represents a trend. His total CK is 48 12/14/16- patient is here for follow-up evaluation of his right TMA site. He continues to receive IV daptomycin per infectious disease. His serum inflammatory markers remain elevated. He complains of intermittent pain to the medial aspect of the TMA site with intermittent erythema. He voices no complaints or concerns regarding hyperbaric therapy. Overall he and his wife are expressing a frustration and discouragement regarrding the length of time of treatment. 12/21/16; small open wound at roughly the first or second metatarsal metatarsalphalyngeal joint reminiscence of his transmetatarsal amputation site  he continues to receive IV daptomycin per infectious disease at [facility]. He will finish these a week tomorrow. He has lab work which I been copied on. His white count is 10.8 hemoglobin 8.9 MCV is low at 75., MCH low at 24.5 platelet count slightly elevated at 626. Differential count shows 70% neutrophils 10% monocytes and 10% eosinophils. His comprehensive metabolic panel shows a slightly low sodium at 133 albumin low at 3.1 total CK is normal at 42 sedimentation rate is much higher at 82. He has had iron studies that show a serum iron of 15 and iron binding capacity of 238 and iron saturation of 6. This is suggestive of iron deficiency. B12 and folate were normal ferritin at 113 The patient tells me that he is not eating well and he has lost weight. He feels episodically nauseated. He is coughing and gagging on mucus which she thinks is sinusitis. He has an appointment with his primary doctor at 5:00 this afternoon in [facility] 01/02/17; the patient developed a subacute pneumonitis. He was admitted to [facility] after a CT scan showed an extensive interstitial pneumonitis [I have not yet seen this]. 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 He has since been discharged. He is not on oxygen. He feels fatigued and very short of breath with exertion. At [facility] the wound care nurse there felt that his wound was healed. He did complete his daptomycin and has follow-up with infectious disease on Friday. X-rays I did before he went to [facility] still suggested residual osteomyelitis in the anterior aspect of the second must metatarsal head. I'm not sure I would've expected any different. There was no other findings. I actually think I did this because of erythema over the first metatarsal head reminiscent 01/11/17; the patient has eosinophilic  pneumonitis. He has been reviewed by pulmonology and given  clearance for hyperbarics at least that's what his wife says. I'll need to see if there is note in care everywhere. Apparently the prognosis for improvement of daptomycin induced eosinophilic granulocyte is is 3 months without steroids. In the meantime infectious disease has placed him on doxycycline until the wound is closed. He is still is lost a lot of weight and his blood sugars are running in the mid 60s to low 80s fasting and at all times during the day 01/18/17; he has had adjustments in his insulin apparently his blood sugars in the morning or over 100. He wants to restart his hyperbaric treatment we'll do this at 1:00. 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 I think there is good reason to complete his treatments in order to give him the best chance of maintaining a healed status and these DFU 3 wounds with MRSA infection in the bone 02/15/17; the patient was seen today in conjunction with HBO. He completed hyperbaric oxygen today. The open area on his transmetatarsal site has remained closed. There was an area of erythema when I saw him earlier in the week on the posterior heel although that is resolved as of today as well. He has been using a cam walker. This is a patient who came to [place] after a transmetatarsal amputation that was necrotic and dehisced. He required revascularization percutaneously on 2 different occasions by Dr. Andree Elk of invasive cardiology at [facility]. 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 He went to [facility] infectious disease and had IV daptomycin for 5 weeks before developing eosinophilic pneumonitis and requiring an admission to hospital/ICU. He made a good recovery and is continued on doxycycline since. As mentioned his foot is closed now. He has a follow-up with Dr. Andree Elk tomorrow. He is  going to [employer] on Monday for a custom-made shoe READMISSION Last visit Dr. Andree Elk [facility] 02/16/17 1. Critical limb ischemia of the RLE:  s/p transmetatarsal amputation of the RLE, now completely healed.  s/p right ATA percutaneous revascularization procedure x2, most recently 11/20/2016 s/p PTA of right AT 100% to less than 20% with a 2.5 x 200 balloon.  He does have some residual osteomyelitis, but given the wound is closed, the orthopedist has recommended to follow. He has a fitting for a special shoe coming up next week. He has completed a course of daptomycin and doxycycline and he has been discharged by ID. He has completed a course of hyperbaric therapy.  Continue Continue medical management with aspirin, Plavix and statin therapy. We will discuss ongoing Plavix therapy at follow-up in 6 months.  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 Given no symptoms, we will conservatively manage. He doesn't want to do any more invasive studies at this time, which is reasonable. The patient arrives today out of 2 concerns both on the right transmetatarsal site. 1 at the level of the reminiscent fifth metatarsal head and the other at roughly the first or second. Both of these look like dark subcutaneous discoloration probably subdermal bleeding. He is recently obtained new adaptive footwear for the right foot. He also has a callus on the left fifth dorsal toe however he follows with podiatry for  this and I don't think this is any issue. ABIs in this clinic today were 0.66 on the right and 1.06 on the left. On entrance into our clinic initially this was 0.95 and 0.92. He does not describe current claudication. They're going away on a cruise in 8 weeks and I think are trying to do the month is much as they can proactively. They follow with podiatry and have  an appointment with Dr. Andree Elk in October READMISSION 06/25/18 This is a patient that we have not seen in almost a year. He is a type II diabetic with known PAD. He is followed by Dr. Andree Elk of interventional cardiology at [facility] in [place]. He is required revascularization for significant PAD. When we first saw him he required a transmetatarsal amputation. He had underlying osteomyelitis with a nonhealing surgical wound. This eventually closed with wound care, IV antibiotics and hyperbaric oxygen. They tell me that he has a modified shoe and he is very active walking up to 4 miles a day. He is followed by Dr. Geroge Baseman of podiatry. His wife states that he underwent a removal of callus over this site on July 9. She felt there may be drainage from this site after that although she could never really determined and there was callus buildup again. On 06/01/18 there was pressure bleeding through the overlying callus. he was given a prescription for 7 days of Bactrim. Fortuitously he has an appointment with Dr. Andree Elk on 06/04/18 and he immediately underwent revascularization of the right leg although I have not had a chance to review these records in care everywhere. This was apparently done through anterior and retrograde access. On 06/06/18 he had another debridement by Dr. Bernette Mayers. Vitamin soaking with Epsom salts for 20 minutes and applying calcium alginate. The original trans-met was on April 2017 I believe by Dr. Doran Durand. His ABI in our clinic was noncompressible today. 07/02/18; x-ray I ordered last week was negative for osteomyelitis. Swab culture was also negative. He is going to require an MRI which I have ordered today. 07/09/18; surprisingly the MRI of the foot that I ordered did not show osteomyelitis. He did suggest the possibility of cellulitis. For this reason I'll go ahead and give him a 10 day course of doxycycline. Although the previous culture of this area was negative 07/16/18; it arrives with  the wound looking much the same. Roughly the same depth. He has thick subcutaneous tissue around the wound orifice but this still has roughly the same depth. Been using silver alginate. We applied Oasis #1 today 07/23/2018; still having to remove a lot of callus and thick subcutaneous tissue to actually define the wound here. Most of this seems to have closed down yet he has a comma shaped divot over the top of the area that still I think is open. We applied Oasis #2 His wife expressed concern about the tip of his left great toe. This almost looks like a small blister. She also showed it today to her podiatrist Dr. Geroge Baseman who did not think this was anything serious. I am not sure is anything serious either however I think it bears some watching. He is not in any pain however he is insensate 07/30/2018;; still on a lot of nonviable tissue over the surface of the wound however cleaning this up reveals a more substantial wound orifice but less of a probing wound depth. This is not probed to bone. There is no evidence of infection The wife is still concerned about a  non-open area on the tip of his left great toe. Almost feels like a bony outgrowth. She had previously showed this to podiatry. I do not think this is a blister. A friction area would be possible although he is really not walking according to his wife. He had a small skin tag on his right buttock but no open wound here either 08/06/2018; we applied a third Oasis last week. Unfortunately there is really no improvement. Still requiring extensive debridement to expose the wound bed from a horizontal slitlike depression. I still have not been able to get this to fill in properly. On the positive side there is now no probable bone from when he first came into the facility. I changed him to silver alginate today after a reasonably aggressive debridement 08/13/2018; once again the patient comes in with skin and subcutaneous tissue closing over the  small probing area with the underlying cavity of the wound on the right TMA site. I applied silver alginate to this last week. Prior to that we used Oasis x3 still not able to get this area granulating. He does not have a probing area of the bone which is an improvement from when I spur started working on this and an MRI did not suggest osteomyelitis. I do not see evidence of infection here but I am increasingly concerned about why I cannot get this area to granulate. Each time I debrided this is looks like this is simply a matter of getting granulation to fill in the hole and then getting epithelialization. This does not seem to happen Also I sent him back to see podiatry Dr. Earleen Newport about the what felt to be bony outgrowth on the tip of his left great toe. Apparently after heel he left the clinic last week or the next day he developed a blood blister. He did see Dr. Earleen Newport. He went on to have a debridement of the medial nail cuticle he now has an open area here as well as some denuded skin. They did an x-ray apparently does have a bony outgrowth or spur but I am not able to look at this. They are using topical antibiotics apparently there was some suggested he use Santyl. Patient's wife was anxious for my opinion of this 08/20/2018; we are able to keep the wound open this time instead of the thick subcutaneous tissue closing over the top of it however unfortunately once again this probes to bone. I do not see any evidence of infection and previous MRI did not show osteomyelitis. I elected to go back to the Oasis to see if we can stimulate some granulation. Last saw his vascular interventional cardiologist Dr. Andree Elk at the beginning of August and he had a repeat procedure. Nevertheless I wonder how much blood flow he has down to this area With regards to the left first toe he is seeing Dr. Earleen Newport next week. They are applying Bactroban to this area. 08/27/2018 ooOnce again he comes in with thick  eschar and subcutaneous tissue over the top of the small probing hole. This does not appear to go down to bone but it still has roughly the same depth. I reapplied Oasis today ooOver the left great toe there appears to be more of the wound at the tip of his toe than there was last week. This has an eschar on the surface of it. Will change to Santyl. There is seeing podiatry this afternoon 09/03/2018 ooHe comes in today with the area on the transmetatarsal's site with  a fair amount of callus, nonviable tissue over the circumference but it was not closed. I removed all of this as well as some subcutaneous debris and reapplied Oasis. There is no exposed bone ooThe area over the tip of the left great toe started off as a nodule of uncertain etiology. He has been followed with podiatry. They have been removing part of the medial nail bed. He has nonviable tissue over the wound he been using Santyl in this area 09/10/18 ooUnfortunately comes in with neither wound area looking improved. The transmetatarsal amputation site once Again has nonviable debris over the surface requiring debridement. Unfortunately underneath this there is nothing that looks viable and this once again goes right down to bone. There is no purulent drainage and no erythema. ooAlso the surgical wound from podiatry on the left first toe has an ischemic-looking eschar over the surface of the tip of the toe I have elected not to attempt candidly debride this ooHis wife as arranged for him to have follow-up noninvasive studies in [place] under the care of Dr. Andree Elk clinic. The question is he has known severe PAD. They had recently seen him in August. He has had revascularizations in both legs within the last 4 or 5 months. He is not complaining of pain and I cannot really get a history of claudication. He has not systemically unwell 09/20/2018 the patient has been to [facility] and been revascularized by Dr. Andree Elk earlier this  week. Apparently he was able to open up the anterior tibial artery although I have not actually seen his formal report. He is going for an attempt to revascularize on the left on Monday. He is apparently working with an investigational stent for lower extremity arteries below the knee and he has talked to the patient about placing that on Monday if possible. The patient has been using silver alginate on the transmetatarsal amputation site and Santyl on the left 09/30/2018; patient had his revascularization on the left this apparently included a standard approach as well as a more distal arterial catheterization although I do not have any information on this from Dr. Andree Elk. In fact I do not even see the initial revascularization that he had on the right. I have included the arterial history from Dr. Andree Elk last note however below; ASSESSMENT/PLAN: 1. Hx of Critical limb ischemia bilateral lower extremities, PAD: -s/p transmetatarsal amputation of the RLE. -s/p right ATA percutaneous revascularization procedure x2, most recently 11/20/2016 s/p PTA of right AT 100% to less than 20% with a 2.5 x 200 balloon. 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 -03/21/2018 s/p PTA of 90% left popliteal artery to <10% with a 5x20 cutting balloon, PTA of 90% left peroneal to <20% with a 3x20 balloon, PTA of 100% left AT to <20% with a 2.5x220 balloon. 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 Will plan on the LLE soon after. Risks/benefits of procedure have been discussed and patient has elected to proceed. We have been using endoform to the right TMA amputation site wound and Santyl to the left great toe 10/07/2018; the area on the tip of his  left great toe looked better we have been using Santyl here. We continue to have a very difficult probing hole  on the right TMA amputation site we have been using endoform. I went on to use his fifth Oasis today 10/14/2018; the tip of the left great toe continues to look better. We have been using Santyl here the surface however is healthy and I think we can change to an alginate. The right TMA has not changed. Once again he has no superficial opening there is callus and thick subcutaneous tissue over the orifice once you remove this there is the probing area that we have been dealing with without too much change. There is no palpable bone I have been placing Oasis here and put Oasis #6 in this today after a more vigorous debridement 10/21/18; the left great toe still has necrotic surface requiring debridement. The right TMA site hasn't changed in view of the thick callus over the wound bed. With removal of this there is still the opening however this does not appear to have the same depth. Again there is no palpable bone. Oasis was replaced 10/28/2018; patient comes in with both wounds looking worse. The area over the first toe tip is now down to bone. The area over the TMA site is deeper and down to bone clearly with a increase in overall wound area. Equally concerning on the right TMA is the complete absence of a pulse this week which is a change. We are not even able to Doppler this. On the right he has a noncompressible ABI greater than 1.4 11/04/2018. Both wounds look somewhat worse. X-rays showed no osteomyelitis of the right foot but on the left there was underlying osteomyelitis in the left great toe distal phalanx. I been on the phone to Dr. Andree Elk surface at [facility] in [place] and they are arranging for another angiogram on the right on Monday. I have him on doxycycline for the osteomyelitis in the left great toe for now. Infectious disease may be necessary 1/17; 2-week hiatus. Patient  was admitted to hospital at [facility]. My understanding is he underwent an angioplasty of the right anterior tibial artery and had stents placed in the left anterior artery and the left tibial peroneal trunk. This was done by Dr. Andree Elk of interventional radiology. There is no major change in either 1 of the wounds. They have been using Aquacel Ag. As far as they are aware no imaging studies were done of the foot which is indeed unfortunate. I had him on doxycycline for 2 weeks since we identified the osteomyelitis in the left great toe by plain x-ray. I have renewed that again today. I am still suspicious about osteomyelitis in the amputation site and would consider doing another MRI to compare with the one done in September. The idea of hyperbaric oxygen certainly comes up for discussion 1/24; no major change in either wound area. I have him on doxycycline for osteomyelitis at the tip of the left great toe. As noted he has been previously and recently revascularized by Dr. Andree Elk at [place]. He is tolerating the doxycycline well. For some reason we do not have an infectious disease consult yet. Culture of drainage from the right foot site last week was negative 1/31; MRI of the right foot did not show osteomyelitis of the right ankle and foot. Notable for a skin ulceration overlying the second metatarsal stump with generalizing soft tissue edema of the ankle and foot consistent with cellulitis. Noted to have a partial-thickness tear of the Achilles tendon 7.5 cm proximal to the insertion Nothing really new in terms of symptoms. Patient's  area on the tip of the left great toe is just about closed although he still has the probing area on the metatarsal amputation site. Appointment with Dr. Linus Salmons of infectious disease next week 2/7; Dr. Novella Olive did not feel that any further antibiotics were necessary he would follow-up in 2 months. The left great toe appears to be closed still some surface callus that I gently  looked under high did not see anything open or anything that was threatening to be open. He still has the open area on the mid part of his TMA site using endoform 2/14; left great toe is closed and he is completing his doxycycline as of last Sunday. He is not on any antibiotics. Unfortunately out of the right foot his wife noticed some subdermal hemorrhage this week. He had been walking 2 miles I had given him permission to do so this is not really a plantar wound. Using endoform to this wound but I changed to silver alginate this week 2/21; left great toe remains closed. Culture last week grew Streptococcus angiosis which I am not really familiar with however it is penicillin sensitive and I am going to put him on Augmentin. Not much change in the wound on the right foot the deep area is still probing precariously close to bone and the wound on the margin of the TMA is larger. 2/28; left great toe remains closed. He is completing the Augmentin I gave him last week. 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 This is being shown to Dr. Andree Elk at [place] to see if there is anything else that can be done here. He has been using silver alginate strips on the right 3/6; left great toe remains closed. He sees Dr. Andree Elk on Monday. The area on the plantar aspect of the right foot has the same small orifice with thick callused tissue around this. However this time with removal of the callus tissue the wound is open all the way along the incision line to the end medially. We have been using silver alginate. 3/13; left toe remains closed although the area is callused. He sees Dr. Jacqualyn Posey of podiatry next week. The area on the plantar right foot looked a lot better this week. Culture I did of this was negative we use silver alginate. He is going next week for an attempt at revascularization by Dr. Andree Elk 3/23; left toe remains closed although the area is callused. He will see Dr.  Jacqualyn Posey in follow-up. The area on the right plantar foot continues to look surprisingly better over the last 3 visits. We have been using silver alginate. The revascularization he was supposed to have by Dr. Andree Elk at [facility] in [place] has been canceled [facility] procedure] 4/6; the right foot remains closed albeit callused. Podiatry canceled the appointment with regards to the left great toe. He has not seen Dr. Andree Elk at [facility] but thinks that Dr. Andree Elk has "done all he can do". He would be a candidate for the hemostaemix trial Readmission 07/29/2019 Mr. Stann Mainland is a man we know well from at least 3 previous stays in this clinic. He is a type II diabetic with severe PAD followed by Dr. Andree Elk at [facility] in [place]. 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 So far everything in both of these areas has remained  closed. About 2 weeks ago he went to see his podiatrist at [facility] Dr. Babs Bertin. He had a thick callus on the left fifth metatarsal head that was shaved. He has developed an open wound in this area. They have been offloading this in his diabetic shoes. The patient has not had any more revascularizations by Dr. Andree Elk since the last time he was here. ABI in our clinic at the posterior tibial on the left was 1.06 10/6; no real change in the area on the plantar met head. Small wound with 2 mm of depth. He has thick skin probably from pressure around the wound. We have been using silver alginate 10/13; small wound in the left fifth plantar met head. Arrives today with undermining laterally and purulent drainage. Our intake nurse cultured this. His wife stated they noticed a change in color over the last day or 2. He is not systemically unwell 10/19; small wound on the fifth plantar metatarsal head. Culture I did last week showed Staphylococcus lugdunensis.  Although this could be a skin contaminant the possibility of a skin and soft tissue infection was there. I did give him empiric doxycycline which should have covered this. We are using silver alginate to the wound. We put him in a total contact cast today. 10/22; small wound on the fifth plantar metatarsal head. He has completed antibiotics. He also has severe PAD which worries me about just about any wound on this man's foot 10/29; small superficial area on the fifth plantar metatarsal head. Measuring slightly smaller. He is not currently on any antibiotics. I been using a total contact cast in this man with very severe PAD but he seems to be tolerating this well 11/5; left plantar fifth metatarsal head. Silver alginate being used under a total contact cast 11/12; wound not much different than last week. Using a #15 scalpel debridement around the wound. Still silver collagen under a total contact cast. He has severe PAD and that may be playing a role in this 11/19; disappointing that the wound is not really changed that much. I think it is come down in overall surface area because originally this was on the lateral part of the fifth metatarsal head however recently it is not really changed. Most of this is filled in but it will not epithelialized there is surface debris on this which may be mostly related to ischemia. They have an appointment with Dr. Andree Elk on 12/9 09/24/2019 on evaluation today patient appears to be doing somewhat better with regard to the wound on the left fifth metatarsal head. Fortunately there does not appear to be any signs of active infection at this time. No fevers, chills, nausea, vomiting, or diarrhea. The wound does not appear to be completely closed and I do feel like the Hydrofera Blue is helping to some degree although I feel like the cast as well as keeping things from breaking down or getting any larger at least. As far as his wound overview it shows that the overall  size of the wound is slightly smaller but really maintaining within the realm of about the same. He had no troubles with the cast which is good news. 12/1; left fifth metatarsal head. Perhaps somewhat more vibrant. We have been using Hydrofera Blue. He sees Dr. Andree Elk at [place] 1 week tomorrow. He be back next week and I hope to have a better idea which way the wound is going however I will not cast him next week in case Dr.  Andree Elk wants to reevaluate things in his foot. The left leg is the leg with the stent in place and I wonder whether these are going to have to be reevaluated. 12/8; left fifth metatarsal head. Quite a bit better this week. Only a small open area remains. He sees Dr. Andree Elk at [place] tomorrow. Dr. Andree Elk is previously done his vascular interventions. He has 2 stents in the left leg as I remember things. I therefore will not put him in a total contact cast today. He will be using a forefoot off loader. We will use silver collagen on the wound. We will see him again next week 12/15; left fifth metatarsal head. He saw Dr. Andree Elk and is scheduled for an angiogram on Thursday. Unfortunately although his wound was a lot better last week it is really deteriorated this week. Small punched-out hole with depth and overhanging tissue. We have been using Hydrofera Blue 12/22; patient had an 80% stenosis proximal to the stent I believe in the below-knee popliteal artery. This was opened by Dr. Andree Elk. According the patient the stent itself was satisfactory. I have not been able to review this note. 12/29; patient arrives today with the wound about the same size some undermining medially. We have been using Hydrofera Blue under a total contact cast and that is what we will do again today 11/04/2019. Slightly smaller in orifice but about 4 mm undermining almost circumferentially. We have been using Hydrofera Blue. Apparently the Hydrofera Blue did not stay on the wound after we removed the cast. Some minor  looking cast irritation on the right lateral 1/12; unfortunately things did not go well this week. Arrives in clinic today with a deeper wound with undermining more concerning a area of pus. Specimen was obtained for culture. We are not going to be able to put him in a cast today. 1/19; purulent material last week which I cultured showed Enterobacter cloacae. He was on ampicillin previously prescribed by his primary doctor which should not have covered this however mysteriously the wound looks somewhat better. There is less depth less erythema certainly no drainage. He will need a course of ciprofloxacin which I will provide today. 1/26; he has completed the ciprofloxacin. I don't think he needs any additional antibiotics. The areas on the left fifth plantar met head. We've been using silver alginate 2/2; comes in today with 0.4 cm of circumferential undermining around a small wound in this area. We have been using silver alginate with a forefoot offloading boot 2/9; again the wound appears larger nonviable surface and with 0.4 cm roughly of circumferential undermining. We have been using silver alginate with a forefoot offloading boot. The wound does not look infected however his wife is quick to point out about swelling on the dorsal foot. 2/16; not much change. Comes in with a small wound with a nonviable necrotic surface. Again marked undermining that I had totally removed last week. He had a arterial Doppler last week in Dr. Andree Elk office at [facility]. They have not heard these results. They are somewhat frustrated. 2/23; if anything this is worse. Undermining increased depth. Nonviable surface that looks pale. According his wife is TBI on the right was 0.22 although I have not verified this. He is going for an angiogram with Dr. Andree Elk next Monday. We are using polymen and offloading in a forefoot offloading boot. 3/2; the patient was revascularized yesterday by [facility]. He  had successful PTA of the left peroneal 60% to less  than 20% and successful PTA of the left ATA 100% to less than 20%. He now has a pulse in the left foot Now that he has some additional blood flow there is not a lot of options here. On one hand we want activity to help keep blood flow augmented on the other hand pressure relief is going to be paramount if we have any chance to get this to close. After some discussion with his wife and patient I went ahead and put him in a total contact cast. 3/9; he arrives in clinic today with some debris over the wound surface. We use silver collagen under a total contact cast after his revascularization by Dr. Andree Elk 3/16; wound about the same depth at 0.5 cm. Surface area perhaps slightly less but it is the depth the really is important. We have been using silver collagen 3/23; not much change here. There is still undermining medially the depth is about the same tissue looks somewhat better. We switched to Iodoflex last week to try to get a better looking wound surface. He does have a small abrasion over the proximal interphalangeal joint dorsally. I this is so small it is difficult even to tell whether it is open. Some abrasion between the fifth toe and the webspace as well 3/30; if anything the wound is deeper this week and larger. Very disappointing. Under illumination debris on the surface which I debrided gently with a #3 curette there is minimal bleeding. We had been using iodoflex under a total contact cast. I am going to take him out of the cast and change him to [place] today which his wife will change daily. 4/6; wound bed looks better. Still undermining medially. Some maceration and erythema around the wound. We have been using Santyl change daily. I took him out of a total contact cast last week 4/13; patient's culture from last time grew MSSA. The wound is larger not a very viable surface. Duplex ultrasound I did last week rule out DVT was  negative. He still has a fair amount of pitting edema in the lower leg and dorsal foot but I am not really certain why. I started him on Keflex 500 every 6 for 10 days starting yesterday His wife tells me that Dr. Andree Elk office in [place] has been in contact they want to look at restudying I believe to see if there is further procedures planned. I told his wife he still has a feeble dorsalis pedis pulse certainly not as robust as after the last procedure although this could be partially because of edema 4/20; he has completed his antibiotics 2 weeks worth of Keflex for MSSA. He has necrotic material over a large portion of this wound. The swelling in his leg is come down presumably resolved with treatment of the infection. He will have a arterial ultrasound by Dr. Andree Elk on Friday and apparently they see him later that day. We are using Bactroban and silver alginate 4/27 patient had his arterial Dopplers ABIs and TBI's done by Dr. Andree Elk. He was noted to have a moderate to significant stenosis involving the left proximal superficial artery consistent with a 50 to 69% diameter reduction. The left anterior tibial artery including the dorsalis pedis was patent. There was no flow in the distal posterior tibial artery monophasic flow within the distal peroneal artery. The great toe index was 0.32 his absolute great toe pressure was 38 arterial flow was detected by PPG within all 5 digits. He is due to have angiography  again on 5/13 Dr. Andree Elk put him back on 2 weeks worth of Keflex. The wound has made absolutely no progress. Interestingly it is more centered over the lateral part of his foot started more on the plantar aspect. 5/4; he is going for his procedure a week Thursday. We will see him back next week he is using Santyl with calcium alginate. His MRI is not toe the 17th. 5/11; patient sees Dr. Andree Elk for angiography on Thursday of this week. His MRI is on 5/17. If his MRI is positive for osteomyelitis he  will need IV antibiotics and hyperbarics he is prepared for this eventuality. If it is not positive we are going to probably have to try an advanced treatment option Objective Constitutional Sitting or standing Blood Pressure is within target range for patient.. Pulse regular and within target range for patient.Marland Kitchen Respirations regular, non-labored and within target range.. Temperature is normal and within the target range for the patient.Marland Kitchen Appears in no distress. Vitals Time Taken: 12:58 PM, Height: 69 in, Source: Stated, Weight: 210 lbs, Source: Stated, BMI: 31, Temperature: 98 F, Pulse: 72 bpm, Respiratory Rate: 18 breaths/min, Blood Pressure: 135/73 mmHg, Capillary Blood Glucose: 128 mg/dl. General Notes: glucose per pt report this am Cardiovascular Pedal pulses are not palpable. General Notes: Wound exam; there is less surface debris on this however I think for the most part I am looking at periosteum. There is some granulation around the wound area but it is measuring larger. Integumentary (Hair, Skin) No erythema around the wound area. Wound #5 status is Open. Original cause of wound was Gradually Appeared. The wound is located on the Left Metatarsal head fifth. The wound measures 2.3cm length x 2cm width x 0.4cm depth; 3.613cm^2 area and 1.445cm^3 volume. There is bone, tendon, and Fat Layer (Subcutaneous Tissue) Exposed exposed. There is no tunneling noted, however, there is undermining starting at 12:00 and ending at 4:00 with a maximum distance of 0.3cm. There is a medium amount of purulent drainage noted. The wound margin is well defined and not attached to the wound base. There is small (1-33%) pink granulation within the wound bed. There is a large (67-100%) amount of necrotic tissue within the wound bed including Adherent Slough. Assessment Active Problems ICD-10 Type 2 diabetes mellitus with foot ulcer Type 2 diabetes mellitus with diabetic peripheral angiopathy without  gangrene Non-pressure chronic ulcer of other part of left foot with other specified severity Plan Follow-up Appointments: Return Appointment in 1 week. Dressing Change Frequency: Wound #5 Left Metatarsal head fifth: Change dressing every day. Wound Cleansing: Wound #5 Left Metatarsal head fifth: May shower and wash wound with soap and water. Primary Wound Dressing: Wound #5 Left Metatarsal head fifth: Calcium Alginate - over santyl Santyl Ointment Other: - pad right great toe for protection Secondary Dressing: Foam - secure with tape Off-Loading: Open toe surgical shoe to: - with felt. apply to left foot. Additional Orders / Instructions: Other: - closely monitor redness. 1. I am continuing with the Santyl and Medihoney 2. Very hopeful that Dr. Andree Elk will be able to extend some circulation to this area 3. If the MRI shows osteomyelitis he will need IV antibiotics and hyperbarics he is prepared for this eventuality having done this in the past for the right foot 4. He is 30 days of Keflex. It will be possible to do a bone biopsy and culture I am stopping this on Friday 5. If the MRI is negative for osteomyelitis I am looking at an advanced  treatment product. Electronic Signature(s) Signed: 03/09/2020 6:13:26 PM By: [name] MD Entered By: [name] on 03/09/2020 13:44:58 -------------------------------------------------------------------------------- SuperBill Details Patient Name: Date of Service: [name], [name] 03/09/2020 Medical Record Number: [id number] Patient Account Number: [id number] Date of Birth/Sex: Treating RN: Jun 29, 1946 (73 y.o. [name] Primary Care Provider: [name] Other Clinician: Referring Provider: Treating Provider/Extender: Leonette Nutting in Treatment: 32 Diagnosis Coding ICD-10 Codes Code Description [phone] Type 2 diabetes mellitus with foot ulcer E11.51 Type 2 diabetes mellitus with diabetic  peripheral angiopathy without gangrene L97.528 Non-pressure chronic ulcer of other part of left foot with other specified severity Facility Procedures The patient participates with Medicare or their insurance follows the Medicare Facility Guidelines: CPT4 Code Description Modifier Quantity [id number] [place] VISIT-LEV 3 EST PT 1 Physician Procedures : CPT4 Code Description Modifier [id number] - WC PHYS LEVEL 3 - EST PT ICD-10 Diagnosis Description E11.621 Type 2 diabetes mellitus with foot ulcer E11.51 Type 2 diabetes mellitus with diabetic peripheral 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

## 2020-03-09 NOTE — Progress Notes (Signed)
KIPLING, GRASER ([id number]) Visit Report for 03/09/2020 Arrival Information Details Patient Name: Date of Service: [name] 03/09/2020 12:30 PM Medical Record Number: [id number] Patient Account Number: [id number] Date of Birth/Sex: Treating RN: February 04, 1946 (74 y.o. [name] Primary Care Ansh Fauble: [name] Other Clinician: Referring Farrin Shadle: Treating Vandella Ord/Extender: [name] in Treatment: 32 Visit Information History Since Last Visit Added or deleted any medications: No Patient Arrived: Ambulatory Any new allergies or adverse reactions: No Arrival Time: 12:56 Had a fall or experienced change in No Accompanied By: spouse activities of daily living that may affect Transfer Assistance: None risk of falls: Patient Identification Verified: Yes Signs or symptoms of abuse/neglect since No Secondary Verification Process Completed: Yes last visito Patient Requires Transmission-Based Precautions: No Hospitalized since last visit: No Patient Has Alerts: No Implantable device outside of the clinic No excluding cellular tissue based products placed in the center since last visit: Has Dressing in Place as Prescribed: Yes Has Footwear/Offloading in Place as Yes Prescribed: Left: Surgical Shoe with Pressure Relief Insole Pain Present Now: No Electronic Signature(s) Signed: 03/09/2020 5:10:43 PM By: [name] RN, BSN Entered By: [name] on 03/09/2020 12:58:37 -------------------------------------------------------------------------------- Clinic Level of Care Assessment Details Patient Name: Date of Service: [name] 03/09/2020 12:30 PM Medical Record Number: [id number] Patient Account Number: [id number] Date of Birth/Sex: Treating RN: 1946/07/21 (73 y.o. [name] [name] Primary Care Celest Reitz: [name] Other Clinician: Referring Georgie Haque: Treating Davine Coba/Extender: [name] in  Treatment: 32 Clinic Level of Care Assessment Items TOOL 4 Quantity Score X- 1 0 Use when only an EandM is performed on FOLLOW-UP visit ASSESSMENTS - Nursing Assessment / Reassessment X- 1 10 Reassessment of Co-morbidities (includes updates in patient status) X- 1 5 Reassessment of Adherence to Treatment Plan ASSESSMENTS - Wound and Skin A ssessment / Reassessment X - Simple Wound Assessment / Reassessment - one wound 1 5 []  - 0 Complex Wound Assessment / Reassessment - multiple wounds []  - 0 Dermatologic / Skin Assessment (not related to wound area) ASSESSMENTS - Focused Assessment []  - 0 Circumferential Edema Measurements - multi extremities []  - 0 Nutritional Assessment / Counseling / Intervention []  - 0 Lower Extremity Assessment (monofilament, tuning fork, pulses) []  - 0 Peripheral Arterial Disease Assessment (using hand held doppler) ASSESSMENTS - Ostomy and/or Continence Assessment and Care []  - 0 Incontinence Assessment and Management []  - 0 Ostomy Care Assessment and Management (repouching, etc.) PROCESS - Coordination of Care X - Simple Patient / Family Education for ongoing care 1 15 []  - 0 Complex (extensive) Patient / Family Education for ongoing care X- 1 10 Staff obtains , Records, T Results / Process Orders est []  - 0 Staff telephones HHA, Nursing Homes / Clarify orders / etc []  - 0 Routine Transfer to another Facility (non-emergent condition) []  - 0 Routine Hospital Admission (non-emergent condition) []  - 0 New Admissions / / Ordering NPWT Apligraf, etc. , []  - 0 Emergency Hospital Admission (emergent condition) X- 1 10 Simple Discharge Coordination []  - 0 Complex (extensive) Discharge Coordination PROCESS - Special Needs []  - 0 Pediatric / Minor Patient Management []  - 0 Isolation Patient Management []  - 0 Hearing / Language / Visual special needs []  - 0 Assessment of Community assistance (transportation,  D/C planning, etc.) []  - 0 Additional assistance / Altered mentation []  - 0 Support Surface(s) Assessment (bed, cushion, seat, etc.) INTERVENTIONS - Wound Cleansing / Measurement X - Simple Wound Cleansing -  one wound 1 5 []  - 0 Complex Wound Cleansing - multiple wounds X- 1 5 Wound Imaging (photographs - any number of wounds) []  - 0 Wound Tracing (instead of photographs) X- 1 5 Simple Wound Measurement - one wound []  - 0 Complex Wound Measurement - multiple wounds INTERVENTIONS - Wound Dressings X - Small Wound Dressing one or multiple wounds 1 10 []  - 0 Medium Wound Dressing one or multiple wounds []  - 0 Large Wound Dressing one or multiple wounds X- 1 5 Application of Medications - topical []  - 0 Application of Medications - injection INTERVENTIONS - Miscellaneous []  - 0 External ear exam []  - 0 Specimen Collection (cultures, biopsies, blood, body fluids, etc.) []  - 0 Specimen(s) / Culture(s) sent or taken to Lab for analysis []  - 0 Patient Transfer (multiple staff / / Similar devices) []  - 0 Simple Staple / Suture removal (25 or less) []  - 0 Complex Staple / Suture removal (26 or more) []  - 0 Hypo / Hyperglycemic Management (close monitor of Blood Glucose) []  - 0 Ankle / Brachial Index (ABI) - do not check if billed separately X- 1 5 Vital Signs Has the patient been seen at the hospital within the last three years: Yes Total Score: 90 Level Of Care: New/Established - Level 3 Electronic Signature(s) Signed: 03/09/2020 5:53:12 PM By: RN Entered By: on 03/09/2020 13:35:37 -------------------------------------------------------------------------------- Encounter Discharge Information Details Patient Name: Date of Service: , Guzman 03/09/2020 12:30 PM Medical Record Number: Patient Account Number: Date of Birth/Sex: Treating RN: 12-25-1945 (74 y.o. Primary Care Veria Stradley:  Other Clinician: Referring [name]: Treating [name]/Extender: in Treatment: 32 Encounter Discharge Information Items Discharge Condition: Stable Ambulatory Status: Ambulatory Discharge Destination: Home Transportation: Private Auto Accompanied By: wife Schedule Follow-up Appointment: Yes Clinical Summary of Care: Patient Declined Electronic Signature(s) Signed: 03/09/2020 5:14:55 PM By: [name] Entered By: [name] on 03/09/2020 13:46:27 -------------------------------------------------------------------------------- Lower Extremity Assessment Details Patient Name: Date of Service: Vinnie Level Guzman 03/09/2020 12:30 PM Medical Record Number: 05/09/2020 Patient Account Number: [id number] Date of Birth/Sex: Treating RN: 12/04/1945 (74 y.o. (65 Primary Care Lowery Paullin: [name] Other Clinician: Referring Antoinette Haskett: Treating Cerrone Debold/Extender: [name] in Treatment: 32 Edema Assessment Assessed: [name]: No] 05/09/2020: No] Edema: [Left: Guzman] [Right: s] Calf Left: Right: Point of Measurement: cm From Medial Instep 40.7 cm cm Ankle Left: Right: Point of Measurement: cm From Medial Instep 28.8 cm cm Vascular Assessment Pulses: Dorsalis Pedis Palpable: [Left:No] Electronic Signature(s) Signed: 03/09/2020 5:10:43 PM By: Cherylin Mylar RN, BSN Entered By: 05/09/2020 on 03/09/2020 13:08:14 -------------------------------------------------------------------------------- Multi Wound Chart Details Patient Name: Date of Service: 05/09/2020, [id number] Guzman 03/09/2020 12:30 PM Medical Record Number: 08/30/1946 Patient Account Number: [id number] Date of Birth/Sex: Treating RN: Dec 06, 1945 (73 y.o. [name]) [name] Primary Care Gawain Crombie: [name] Other Clinician: Referring Che Below: Treating Atreyu Mak/Extender: 05/09/2020 in Treatment: 32 Vital  Signs Height(in): 69 Capillary Blood Glucose(mg/dl): [name] Weight(lbs): [name] Pulse(bpm): 72 Body Mass Index(BMI): 31 Blood Pressure(mmHg): 135/73 Temperature(F): 98 Respiratory Rate(breaths/min): 18 Photos: [5:No Photos Left Metatarsal head fifth] [N/A:N/A N/A] Wound Location: [5:Gradually Appeared] [N/A:N/A] Wounding Event: [5:Diabetic Wound/Ulcer of the Lower] [N/A:N/A] Primary Etiology: [5:Extremity Cataracts, Chronic sinus] [N/A:N/A] Comorbid History: [5:problems/congestion, Coronary Artery Disease, Hypertension, Peripheral Arterial Disease, Type II Diabetes, Osteoarthritis, Neuropathy 07/14/2019] [N/A:N/A] Date Acquired: [5:32] [N/A:N/A] Weeks of Treatment: [5:Open] [N/A:N/A] Wound Status: [5:2.3x2x0.4] [N/A:N/A]  Measurements L x W x D (cm) [5:3.613] [N/A:N/A] A (cm) : rea [5:1.445] [N/A:N/A] Volume (cm) : [5:-5634.90%] [N/A:N/A] % Reduction in A rea: [5:-11015.40%] [N/A:N/A] % Reduction in Volume: [5:12] Starting Position 1 (o'clock): [5:4] Ending Position 1 (o'clock): [5:0.3] Maximum Distance 1 (cm): [5:Yes] [N/A:N/A] Undermining: [5:Grade 2] [N/A:N/A] Classification: [5:Medium] [N/A:N/A] Exudate A mount: [5:Purulent] [N/A:N/A] Exudate Type: [5:yellow, brown, green] [N/A:N/A] Exudate Color: [5:Well defined, not attached] [N/A:N/A] Wound Margin: [5:Small (1-33%)] [N/A:N/A] Granulation A mount: [5:Pink] [N/A:N/A] Granulation Quality: [5:Large (67-100%)] [N/A:N/A] Necrotic A mount: [5:Fat Layer (Subcutaneous Tissue)] [N/A:N/A] Exposed Structures: [5:Exposed: Yes Tendon: Yes Bone: Yes Fascia: No Muscle: No Joint: No None] [N/A:N/A] Epithelialization: Treatment Notes Electronic Signature(s) Signed: 03/09/2020 5:53:12 PM By: [name] RN Signed: 03/09/2020 6:13:26 PM By: Baltazar Najjar MD Entered By: Baltazar Najjar on 03/09/2020 13:41:06 -------------------------------------------------------------------------------- Multi-Disciplinary Care Plan Details Patient  Name: Date of Service: Vinnie Level, Yves Dill Guzman 03/09/2020 12:30 PM Medical Record Number: [id number] Patient Account Number: [id number] Date of Birth/Sex: Treating RN: [birth year] (73 y.o. [name] Primary Care Susann Lawhorne: [name] Other Clinician: Referring Imer Foxworth: Treating Danen Lapaglia/Extender: [name] in Treatment: 32 Active Inactive Wound/Skin Impairment Nursing Diagnoses: Knowledge deficit related to ulceration/compromised skin integrity Goals: Patient/caregiver will verbalize understanding of skin care regimen Date Initiated: 07/29/2019 Target Resolution Date: 03/26/2020 Goal Status: Active Ulcer/skin breakdown will have a volume reduction of 30% by week 4 Date Initiated: 07/29/2019 Date Inactivated: 10/21/2019 Target Resolution Date: 10/10/2019 Goal Status: Unmet Unmet Reason: comorbities. blood flow Ulcer/skin breakdown will have a volume reduction of 50% by week 8 Date Initiated: 10/21/2019 Date Inactivated: 12/02/2019 Target Resolution Date: 11/21/2019 Goal Status: Unmet Unmet Reason: comorbities Ulcer/skin breakdown will have a volume reduction of 80% by week 12 Date Initiated: 12/02/2019 Date Inactivated: 01/06/2020 Target Resolution Date: 01/02/2020 Goal Status: Unmet Unmet Reason: comorbities Interventions: Assess patient/caregiver ability to obtain necessary supplies Assess patient/caregiver ability to perform ulcer/skin care regimen upon admission and as needed Assess ulceration(s) every visit Notes: Electronic Signature(s) Signed: 03/09/2020 5:53:12 PM By: [name] RN Entered By: [name] on 03/09/2020 13:18:46 -------------------------------------------------------------------------------- Pain Assessment Details Patient Name: Date of Service: [name] 03/09/2020 12:30 PM Medical Record Number: [id number] Patient Account Number: [id number] Date of Birth/Sex: Treating RN: Apr 25, 1946 (74 y.o. [name] Primary  Care Vesna Kable: [name] Other Clinician: Referring Lenetta Piche: Treating Ramar Nobrega/Extender: [name] in Treatment: 32 Active Problems Location of Pain Severity and Description of Pain Patient Has Paino No Site Locations Rate the pain. Current Pain Level: 0 Pain Management and Medication Current Pain Management: Electronic Signature(s) Signed: 03/09/2020 5:10:43 PM By: [name] RN, BSN Entered By: [name] on 03/09/2020 13:01:30 -------------------------------------------------------------------------------- Patient/Caregiver Education Details Patient Name: Date of Service: [name] 5/11/2021andnbsp12:30 PM Medical Record Number: [id number] Patient Account Number: [id number] Date of Birth/Gender: Treating RN: Oct 11, 1946 (73 y.o. [name] [name] Primary Care Physician: [name] Other Clinician: Referring Physician: Treating Physician/Extender: [name] in Treatment: 32 Education Assessment Education Provided To: Patient Education Topics Provided Wound/Skin Impairment: Methods: Explain/Verbal Responses: State content correctly Electronic Signature(s) Signed: 03/09/2020 5:53:12 PM By: [name] RN Entered By: [name] on 03/09/2020 13:19:08 -------------------------------------------------------------------------------- Wound Assessment Details Patient Name: Date of Service: [name] 03/09/2020 12:30 PM Medical Record Number: [id number] Patient Account Number: [id number] Date of Birth/Sex: Treating RN: 02/17/1946 (74 y.o. [name] Primary Care Prarthana Parlin: [name] Other Clinician: Referring Tristram Milian: Treating Minola Guin/Extender: [name] in Treatment:  32 Wound Status Wound Number: 5 Primary Diabetic Wound/Ulcer of the Lower Extremity Etiology: Wound Location: Left Metatarsal head fifth Wound Open Wounding Event: Gradually  Appeared Status: Date Acquired: 07/14/2019 Comorbid Cataracts, Chronic sinus problems/congestion, Coronary Artery Weeks Of Treatment: 32 History: Disease, Hypertension, Peripheral Arterial Disease, Type II Clustered Wound: No Diabetes, Osteoarthritis, Neuropathy Wound Measurements Length: (cm) 2.3 Width: (cm) 2 Depth: (cm) 0.4 Area: (cm) 3.613 Volume: (cm) 1.445 % Reduction in Area: -5634.9% % Reduction in Volume: -11015.4% Epithelialization: None Tunneling: No Undermining: Yes Starting Position (o'clock): 12 Ending Position (o'clock): 4 Maximum Distance: (cm) 0.3 Wound Description Classification: Grade 2 Wound Margin: Well defined, not attached Exudate Amount: Medium Exudate Type: Purulent Exudate Color: yellow, brown, green Foul Odor After Cleansing: No Slough/Fibrino Yes Wound Bed Granulation Amount: Small (1-33%) Exposed Structure Granulation Quality: Pink Fascia Exposed: No Necrotic Amount: Large (67-100%) Fat Layer (Subcutaneous Tissue) Exposed: Yes Necrotic Quality: Adherent Slough Tendon Exposed: Yes Muscle Exposed: No Joint Exposed: No Bone Exposed: Yes Treatment Notes Wound #5 (Left Metatarsal head fifth) 1. Cleanse With Wound Cleanser 2. Periwound Care Skin Prep 3. Primary Dressing Applied Calcium Alginate Santyl 4. Secondary Dressing Dry Gauze 5. Secured With Recruitment consultant) Signed: 03/09/2020 5:10:43 PM By: Baruch Gouty RN, BSN Entered By: Baruch Gouty on 03/09/2020 13:09:44 -------------------------------------------------------------------------------- Vitals Details Patient Name: Date of Service: Ileana Roup, Carola Frost Guzman 03/09/2020 12:30 PM Medical Record Number: [id number] Patient Account Number: [id number] Date of Birth/Sex: Treating RN: 1946-03-06 (74 y.o. [name] Primary Care Candus Braud: [name] Other Clinician: Referring Kacee Koren: Treating Bonny Egger/Extender: Leonette Nutting in  Treatment: 32 Vital Signs Time Taken: 12:58 Temperature (F): 98 Height (in): 69 Pulse (bpm): 72 Source: Stated Respiratory Rate (breaths/min): 18 Weight (lbs): 210 Blood Pressure (mmHg): 135/73 Source: Stated Capillary Blood Glucose (mg/dl): 128 Body Mass Index (BMI): 31 Reference Range: 80 - 120 mg / dl Notes glucose per pt report this am Electronic Signature(s) Signed: 03/09/2020 5:10:43 PM By: Baruch Gouty RN, BSN Entered By: Baruch Gouty on 03/09/2020 12:59:49

## 2020-03-15 ENCOUNTER — Ambulatory Visit (HOSPITAL_COMMUNITY)
Admission: RE | Admit: 2020-03-15 | Discharge: 2020-03-15 | Disposition: A | Discharge status: Home / Self Care | Payer: Medicare Other | Source: Ambulatory Visit | Attending: Internal Medicine | Admitting: Internal Medicine

## 2020-03-15 ENCOUNTER — Other Ambulatory Visit: Payer: Self-pay

## 2020-03-15 DIAGNOSIS — E11620 Type 2 diabetes mellitus with diabetic dermatitis: Secondary | ICD-10-CM | POA: Diagnosis not present

## 2020-03-16 ENCOUNTER — Encounter (HOSPITAL_BASED_OUTPATIENT_CLINIC_OR_DEPARTMENT_OTHER): Payer: Medicare Other | Admitting: Internal Medicine

## 2020-03-16 DIAGNOSIS — E11621 Type 2 diabetes mellitus with foot ulcer: Secondary | ICD-10-CM | POA: Diagnosis not present

## 2020-03-18 NOTE — Progress Notes (Addendum)
Todd, Guzman ([id number]) Visit Report for 03/16/2020 Arrival Information Details Patient Name: Date of Service: [name] 03/16/2020 12:30 PM Medical Record Number: [id number] Patient Account Number: [id number] Date of Birth/Sex: Treating RN: 11/30/1945 (74 y.o. [name], Vaughan Basta Primary Care Donato Studley: [name] Other Clinician: Referring Treshaun Carrico: Treating Josemiguel Gries/Extender: Leonette Nutting in Treatment: 33 Visit Information History Since Last Visit All ordered tests and consults were completed: Yes Patient Arrived: Ambulatory Added or deleted any medications: No Arrival Time: 12:52 Any new allergies or adverse reactions: No Accompanied By: spouse Had a fall or experienced change in No Transfer Assistance: None activities of daily living that may affect Patient Identification Verified: Yes risk of falls: Secondary Verification Process Completed: Yes Signs or symptoms of abuse/neglect since last visito No Patient Requires Transmission-Based Precautions: No Hospitalized since last visit: Yes Patient Has Alerts: No Implantable device outside of the clinic excluding No cellular tissue based products placed in the center since last visit: Has Dressing in Place as Prescribed: Yes Has Compression in Place as Prescribed: Yes Pain Present Now: No Electronic Signature(s) Signed: 03/16/2020 5:27:28 PM By: Baruch Gouty RN, BSN Entered By: Baruch Gouty on 03/16/2020 12:54:04 -------------------------------------------------------------------------------- Clinic Level of Care Assessment Details Patient Name: Date of Service: [name] YE 03/16/2020 12:30 PM Medical Record Number: [id number] Patient Account Number: [id number] Date of Birth/Sex: Treating RN: 1945-12-25 (73 y.o. [name]) [name] Primary Care Antonis Lor: [name] Other Clinician: Referring Mattison Stuckey: Treating Kyrese Gartman/Extender: Leonette Nutting in Treatment:  33 Clinic Level of Care Assessment Items TOOL 4 Quantity Score X- 1 0 Use when only an EandM is performed on FOLLOW-UP visit ASSESSMENTS - Nursing Assessment / Reassessment X- 1 10 Reassessment of Co-morbidities (includes updates in patient status) X- 1 5 Reassessment of Adherence to Treatment Plan ASSESSMENTS - Wound and Skin A ssessment / Reassessment X - Simple Wound Assessment / Reassessment - one wound 1 5 []  - 0 Complex Wound Assessment / Reassessment - multiple wounds []  - 0 Dermatologic / Skin Assessment (not related to wound area) ASSESSMENTS - Focused Assessment []  - 0 Circumferential Edema Measurements - multi extremities []  - 0 Nutritional Assessment / Counseling / Intervention []  - 0 Lower Extremity Assessment (monofilament, tuning fork, pulses) []  - 0 Peripheral Arterial Disease Assessment (using hand held doppler) ASSESSMENTS - Ostomy and/or Continence Assessment and Care []  - 0 Incontinence Assessment and Management []  - 0 Ostomy Care Assessment and Management (repouching, etc.) PROCESS - Coordination of Care X - Simple Patient / Family Education for ongoing care 1 15 []  - 0 Complex (extensive) Patient / Family Education for ongoing care X- 1 10 Staff obtains Programmer, systems, Records, T Results / Process Orders est []  - 0 Staff telephones HHA, Nursing Homes / Clarify orders / etc []  - 0 Routine Transfer to another Facility (non-emergent condition) []  - 0 Routine Hospital Admission (non-emergent condition) []  - 0 New Admissions / Biomedical engineer / Ordering NPWT Apligraf, etc. , []  - 0 Emergency Hospital Admission (emergent condition) X- 1 10 Simple Discharge Coordination []  - 0 Complex (extensive) Discharge Coordination PROCESS - Special Needs []  - 0 Pediatric / Minor Patient Management []  - 0 Isolation Patient Management []  - 0 Hearing / Language / Visual special needs []  - 0 Assessment of Community assistance (transportation, D/C  planning, etc.) []  - 0 Additional assistance / Altered mentation []  - 0 Support Surface(s) Assessment (bed, cushion, seat, etc.) INTERVENTIONS - Wound Cleansing / Measurement X - Simple Wound Cleansing -  one wound 1 5 []  - 0 Complex Wound Cleansing - multiple wounds X- 1 5 Wound Imaging (photographs - any number of wounds) []  - 0 Wound Tracing (instead of photographs) X- 1 5 Simple Wound Measurement - one wound []  - 0 Complex Wound Measurement - multiple wounds INTERVENTIONS - Wound Dressings []  - 0 Small Wound Dressing one or multiple wounds X- 1 15 Medium Wound Dressing one or multiple wounds []  - 0 Large Wound Dressing one or multiple wounds X- 1 5 Application of Medications - topical []  - 0 Application of Medications - injection INTERVENTIONS - Miscellaneous []  - 0 External ear exam []  - 0 Specimen Collection (cultures, biopsies, blood, body fluids, etc.) []  - 0 Specimen(s) / Culture(s) sent or taken to Lab for analysis []  - 0 Patient Transfer (multiple staff / / Similar devices) []  - 0 Simple Staple / Suture removal (25 or less) []  - 0 Complex Staple / Suture removal (26 or more) []  - 0 Hypo / Hyperglycemic Management (close monitor of Blood Glucose) []  - 0 Ankle / Brachial Index (ABI) - do not check if billed separately X- 1 5 Vital Signs Has the patient been seen at the hospital within the last three years: Yes Total Score: 95 Level Of Care: New/Established - Level 3 Electronic Signature(s) Signed: 03/16/2020 5:13:38 PM By: RN Entered By: on 03/16/2020 14:10:12 -------------------------------------------------------------------------------- Encounter Discharge Information Details Patient Name: Date of Service: , YE 03/16/2020 12:30 PM Medical Record Number: Patient Account Number: Date of Birth/Sex: Treating RN: 05-19-1946 (74 y.o. Primary Care [name]: Other Clinician: Referring [name]: Treating Daxton Nydam/Extender: in Treatment: 33 Encounter Discharge Information Items Discharge Condition: Stable Ambulatory Status: Ambulatory Discharge Destination: Home Transportation: Private Auto Accompanied By: wife Schedule Follow-up Appointment: Yes Clinical Summary of Care: Patient Declined Electronic Signature(s) Signed: 03/16/2020 5:47:21 PM By: [name] Entered By: 03/18/2020 on 03/16/2020 14:02:22 -------------------------------------------------------------------------------- Lower Extremity Assessment Details Patient Name: Date of Service: Yves Dill YE 03/16/2020 12:30 PM Medical Record Number: [id number] Patient Account Number: [id number] Date of Birth/Sex: Treating RN: May 22, 1946 (74 y.o. [name] Primary Care Stanton Kissoon: [name] Other Clinician: Referring Man Effertz: Treating Cai Anfinson/Extender: [name] in Treatment: 33 Edema Assessment Assessed: 03/18/2020: No] Cherylin Mylar: No] Edema: [Left: Ye] [Right: s] Calf Left: Right: Point of Measurement: cm From Medial Instep 39 cm cm Ankle Left: Right: Point of Measurement: cm From Medial Instep 28.6 cm cm Vascular Assessment Pulses: Dorsalis Pedis Palpable: [Left:No] Electronic Signature(s) Signed: 03/16/2020 5:27:28 PM By: 03/18/2020 RN, BSN Entered By: Deniece Ree on 03/16/2020 13:00:20 -------------------------------------------------------------------------------- Multi Wound Chart Details Patient Name: Date of Service: [id number], [id number] YE 03/16/2020 12:30 PM Medical Record Number: [id number] Patient Account Number: [name] Date of Birth/Sex: Treating RN: 03-May-1946 (73 y.o. 34) [name] Primary Care Visente Kirker: [name] Other Clinician: Referring Alaila Pillard: Treating Rashell Shambaugh/Extender: 03/18/2020 in Treatment: 33 Vital Signs Height(in):  69 Capillary Blood Glucose(mg/dl): [name] Weight(lbs): [name] Pulse(bpm): 76 Body Mass Index(BMI): 31 Blood Pressure(mmHg): 108/63 Temperature(F): 98.5 Respiratory Rate(breaths/min): 18 Photos: [5:No Photos Left Metatarsal head fifth] [N/A:N/A N/A] Wound Location: [5:Gradually Appeared] [N/A:N/A] Wounding Event: [5:Diabetic Wound/Ulcer of the Lower] [N/A:N/A] Primary Etiology: [5:Extremity Cataracts, Chronic sinus] [N/A:N/A] Comorbid History: [5:problems/congestion, Coronary Artery Disease, Hypertension, Peripheral Arterial Disease, Type II Diabetes, Osteoarthritis, Neuropathy 07/14/2019] [N/A:N/A] Date Acquired: [5:33] [N/A:N/A] Weeks of Treatment: [5:Open] [N/A:N/A] Wound Status: [5:2.2x1.8x0.4] [N/A:N/A] Measurements  L x W x D (cm) [5:3.11] [N/A:N/A] A (cm) : rea [5:1.244] [N/A:N/A] Volume (cm) : [5:-4836.50%] [N/A:N/A] % Reduction in A rea: [5:-9469.20%] [N/A:N/A] % Reduction in Volume: [5:Grade 2] [N/A:N/A] Classification: [5:Medium] [N/A:N/A] Exudate A mount: [5:Purulent] [N/A:N/A] Exudate Type: [5:yellow, brown, green] [N/A:N/A] Exudate Color: [5:Distinct, outline attached] [N/A:N/A] Wound Margin: [5:None Present (0%)] [N/A:N/A] Granulation A mount: [5:Large (67-100%)] [N/A:N/A] Necrotic A mount: [5:Fat Layer (Subcutaneous Tissue)] [N/A:N/A] Exposed Structures: [5:Exposed: Yes Tendon: Yes Fascia: No Muscle: No Joint: No Bone: No None] [N/A:N/A] Treatment Notes Wound #5 (Left Metatarsal head fifth) 1. Cleanse With Wound Cleanser 2. Periwound Care Skin Prep 3. Primary Dressing Applied Calcium Alginate Santyl 4. Secondary Dressing Dry Gauze 5. Secured With Secretary/administrator) Signed: 03/16/2020 5:13:38 PM By: [name] RN Signed: 03/18/2020 12:52:55 PM By: Baltazar Najjar MD Entered By: Baltazar Najjar on 03/16/2020 14:20:15 -------------------------------------------------------------------------------- Multi-Disciplinary Care Plan Details Patient  Name: Date of Service: Vinnie Level, Yves Dill YE 03/16/2020 12:30 PM Medical Record Number: [id number] Patient Account Number: [id number] Date of Birth/Sex: Treating RN: [birth year] (73 y.o. [name] Primary Care Thaniel Coluccio: [name] Other Clinician: Referring Oron Westrup: Treating Ellamae Lybeck/Extender: [name] in Treatment: 18 Active Inactive Electronic Signature(s) Signed: 05/21/2020 5:44:58 PM By: [name] RN Previous Signature: 03/16/2020 5:13:38 PM Version By: [name] RN Entered By: [name] on 05/17/2020 15:57:49 -------------------------------------------------------------------------------- Pain Assessment Details Patient Name: Date of Service: [name] 03/16/2020 12:30 PM Medical Record Number: [id number] Patient Account Number: [id number] Date of Birth/Sex: Treating RN: Jan 05, 1946 (74 y.o. [name] Primary Care Adenike Shidler: [name] Other Clinician: Referring Tayonna Bacha: Treating Aarvi Stotts/Extender: [name] in Treatment: 33 Active Problems Location of Pain Severity and Description of Pain Patient Has Paino No Site Locations Rate the pain. Rate the pain. Current Pain Level: 0 Pain Management and Medication Current Pain Management: Electronic Signature(s) Signed: 03/16/2020 5:27:28 PM By: [name] RN, BSN Entered By: [name] on 03/16/2020 12:56:09 -------------------------------------------------------------------------------- Patient/Caregiver Education Details Patient Name: Date of Service: [name] 5/18/2021andnbsp12:30 PM Medical Record Number: [id number] Patient Account Number: [id number] Date of Birth/Gender: Treating RN: Dec 13, 1945 (73 y.o. [name] [name] Primary Care Physician: [name] Other Clinician: Referring Physician: Treating Physician/Extender: [name] in Treatment: 33 Education Assessment Education Provided  To: Patient Education Topics Provided Wound/Skin Impairment: Methods: Explain/Verbal Responses: State content correctly Electronic Signature(s) Signed: 03/16/2020 5:13:38 PM By: [name] RN Entered By: [name] on 03/16/2020 12:54:09 -------------------------------------------------------------------------------- Wound Assessment Details Patient Name: Date of Service: [name] 03/16/2020 12:30 PM Medical Record Number: [id number] Patient Account Number: [id number] Date of Birth/Sex: Treating RN: 08-Dec-1945 (74 y.o. [name] Primary Care Crystalann Korf: [name] Other Clinician: Referring Keimora Swartout: Treating Seletha Zimmermann/Extender: [name] in Treatment: 33 Wound Status Wound Number: 5 Primary Diabetic Wound/Ulcer of the Lower Extremity Etiology: Wound Location: Left Metatarsal head fifth Wound Open Wounding Event: Gradually Appeared Status: Date Acquired: 07/14/2019 Comorbid Cataracts, Chronic sinus problems/congestion, Coronary Artery Weeks Of Treatment: 33 History: Disease, Hypertension, Peripheral Arterial Disease, Type II Clustered Wound: No Diabetes, Osteoarthritis, Neuropathy Photos Photo Uploaded By: Benjaman Kindler on 03/19/2020 16:53:49 Wound Measurements Length: (cm) 2.2 Width: (cm) 1.8 Depth: (cm) 0.4 Area: (cm) 3.11 Volume: (cm) 1.244 % Reduction in Area: -4836.5% % Reduction in Volume: -9469.2% Epithelialization: None Tunneling: No Undermining: No Wound Description Classification: Grade 2 Wound Margin: Distinct, outline attached Exudate Amount: Medium Exudate Type: Purulent Exudate Color: yellow, brown, green Foul Odor After  Cleansing: No Slough/Fibrino Yes Wound Bed Granulation Amount: None Present (0%) Exposed Structure Necrotic Amount: Large (67-100%) Fascia Exposed: No Necrotic Quality: Adherent Slough Fat Layer (Subcutaneous Tissue) Exposed: Yes Tendon Exposed: Yes Muscle Exposed: No Joint  Exposed: No Bone Exposed: No Electronic Signature(s) Signed: 03/16/2020 5:27:28 PM By: [name] RN, BSN Entered By: [name] on 03/16/2020 13:03:34 -------------------------------------------------------------------------------- Vitals Details Patient Name: Date of Service: Vinnie Level, Yves Dill YE 03/16/2020 12:30 PM Medical Record Number: [id number] Patient Account Number: [id number] Date of Birth/Sex: Treating RN: 01-26-1946 (74 y.o. [name] Primary Care Ondria Oswald: [name] Other Clinician: Referring Mavryk Pino: Treating Mathias Bogacki/Extender: [name] in Treatment: 33 Vital Signs Time Taken: 12:54 Temperature (F): 98.5 Height (in): 69 Pulse (bpm): 76 Source: Stated Respiratory Rate (breaths/min): 18 Weight (lbs): 210 Blood Pressure (mmHg): 108/63 Source: Stated Capillary Blood Glucose (mg/dl): 026 Body Mass Index (BMI): 31 Reference Range: 80 - 120 mg / dl Notes glucose per pt report this am Electronic Signature(s) Signed: 03/16/2020 5:27:28 PM By: [name] RN, BSN Entered By: [name] on 03/16/2020 12:56:00

## 2020-03-18 NOTE — Progress Notes (Signed)
LUTHER, SPRINGS ([id number]) Visit Report for 03/16/2020 HPI Details Patient Name: Date of Service: RO Lucendia Herrlich 03/16/2020 12:30 PM Medical Record Number: [id number] Patient Account Number: [id number] Date of Birth/Sex: Treating RN: [birth year] (73 y.o. [name]) [name] Primary Care Provider: [name] Other Clinician: Referring Provider: Treating Provider/Extender: Leonette Nutting in Treatment: 33 History of Present Illness HPI Description: 05/18/16; this is a 74year-old diabetic who is a type II diabetic on insulin. The history is that he traumatized his right foot developed a sore sometime in late March. Shortly thereafter he went on a cruise but he had to get off the cruise ship in [place] and fly urgently back to [place] where he was admitted to [facility] and ultimately underwent a transmetatarsal amputation by Dr. 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 According to the patient and his wife this wound never really healed. He was seen on 2 occasions in the wound care center in [place] and had vascular studies and then was referred urgently to Dr. Bridgett Larsson of vascular surgery. He underwent an angiogram on 05/10/16. Unfortunately nothing really could be done to improve his vascular status. He had a 75-90% stenosis in the midsegment of 1 segment of the posterior femoral artery. He had a patent popliteal, his anterior tibial occluded shortly after takeoff. Perineal had a greater than 90% stenosis posterior tibial is occluded feet had no distal collaterals feed distal aspect of the transmetatarsal amputation site. The patient tells me that he had a prolonged period of Santyl by Dr. Doran Durand was some initial improvement but then this was stopped. I think they're only applying daily dressings/dry dressings. He has not had a recent x-ray of the right foot he did have one before his surgery in April. His wife by the dimensions of the wound/surgical site being followed at  home since 4/20. At that point the dimensions were 0.5 x 12 x 0.2 on 7/19 this was 1.8 x 6 x 0.4. He is not currently on any antibiotics. His hemoglobin A1c in early April was 12.9 at that point he was started on insulin. Apparently his blood sugars are much lower he has an appointment with Dr. Legrand Como Alteimer of endocrine next week. 05/29/16 x-ray of the area did not show osteomyelitis. I think he probably needs an MRI at this point. His wife is asking about something called"Yireh" cream which is not FDA approved. I have not heard of this. 06/22/16; MRI did not really suggest osteomyelitis. There was minimal marrow edema and enhancement in the stump of the second metatarsal felt to be secondary likely to postoperative change rather than osteomyelitis. The patient has arranged his own consultation with Dr. Andree Elk at [place], apparently their daughter lives in [place] and has some connection here. Any improvement in vascular supply by Dr. Andree Elk would of course be helpful. Dr. Bridgett Larsson did not feel that anything further could be done other than amputation if wound care did not result in healing or if the area deteriorates. 06/26/16; the patient has been to see Dr. Andree Elk at [place] and had an angiogram. He is going for a procedure on Thursday which will involve catheterization. I'm not sure if this is an anterograde or retrograde approach. He has been using Santyl to the wound 07/10/16; the patient had a repeat angiogram and angioplasty at [place] by Dr. Brunetta Jeans. His angiogram showed right CFA and profundal widely patent. The right as of a.m. popliteal artery were widely patent the right anterior tibial was occluded  proximally and reconstitutes at the ankle. Peroneal artery was patent to the foot. Posterior tibial artery was occluded. The patient had angioplasty of the anterior tibial artery.. This was quite successful. He was recommended for Plavix as well as aspirin. 07/17/16; the patient was close to be a nurse visit  today however the outer dressing of the Apligraf fell off. Noted drainage. I was asked to see the wound. The patient is noted an odor however his wife had noted that. Drainage with Apligraf not necessarily a bad thing. He has not been systemically unwell 07/24/16; we are still have an issue with drainage of this wound. In spite of this I applied his second Apligraf. Medially the area still is probing to bone. 08/07/16; Apligraf reapplied in general wound looks improved. 08/21/16 Apligraf #4. Wound looks much better 09/04/16 patientt's wound again today continues to appear to improve with the application of the Apligraf's. He notes no increased discomfort or concerns at this point in time. 09/18/16; the patient returns today 2 weeks after his fifth application of Apligraf. Predictably three quarters of the width of this wound has healed. The deep area that probe to bone medially is still open. The patient asked how much out-of-pocket dollars would be for additional Apligraf's. 09/25/16; now using Hydrofera Blue. He has completed 5 Apligraf applications with considerable improvement in this deep open transmetatarsal amputation site. His wound is now a triangular-shaped wound on the medial aspect. At roughly 12 to 2:00 this probes another centimeter but as opposed to in the past this does not probe to bone. The patient has been seen at [facility] by Dr. Zenia Resides. He is not planning to do any more revascularization unless the wound stalls or worsens per the patient 10/02/16; 0.7 x 0.8 x 0.8. Unfortunately although the wound looks stable to improved. There is now easily probable bone. This hasn't been present for several weeks. Patient is not otherwise symptomatic he is not experiencing any pain. I did a culture of the wound bed 10/09/16. Deterioration last week. Culture grew MRSA and although there is improvement here with doxycycline prescribed over the phone I'm going to try to get him linezolid 600 twice a day for  10 days today. 10/16/16; he is completing a weeks worth of linezolid and still has 3 more days to go. Small triangular-shaped open area with some degree of undermining. There is still palpable bone with a curet. Overall the area appears better than last week 10/20/16 he has completed the linezolid still having some nausea and vomiting but no diarrhea. He has exposed bone this week which is a deterioration. 10/27/16 patient now has a small but probing wound down to bone. Culture of this bone that I did last week showed a few methicillin-resistant staph aureus. I have little doubt that this represents acute/subacute osteomyelitis. The patient is currently on Doxy which I will continue he also completed 10 days of linezolid. We are now in a difficult situation with this patient's foot after considerable discussion we will send him back to see Dr. Doran Durand for a surgical opinion of this I'm also going to try to arrange a infectious disease consult at [facility] hopefully week and get this prior to her usual 4-6 weeks we having [place]. The patient clearly is going to need 6 weeks of IV vancomycin. If we cannot arrange this expediently I'll have to consider ordering this myself through a home infusion company 11/03/16; the patient now has a small in terms of circumference but probing wound. No  bone palpable today. The patient remains on doxycycline 100 twice a day which should support him until he sees infectious disease at [facility] next week the following week on Wednesday I believe he has an appointment with Dr. Andree Elk at [facility] who is his vascular cardiologist. Finally he has an appointment with Dr. Doran Durand on 11/22/16 we have been using silver alginate. The patient's wife states they are having trouble getting this through [facility] 11/13/16; the patient was seen by infectious disease at [facility] in the 11th PICC line placed in preparation for IV antibiotics. A tummy he has not going to get IV vancomycin o  Ceftaroline. They've also ordered an MRI. Patient has a follow-up with Dr. Doran Durand on 11/22/16 and Dr. Andree Elk at [facility] tomorrow 11/23/16 the patient is on daptomycin as directed by infectious disease at [facility]. He is also been back to see Dr. Andree Elk at [facility]. He underwent a repeat arteriogram. He had a successful PTA of the right anterior tibial artery. He is on dual antiplatelete treatment with Plavix and aspirin. Finally he had the MRI of his foot in [place]. This showed cellulitis about the foot worse distally edema and enhancement in the reminiscent of the second metatarsal was consistent with osteomyelitis therefore what I was assuming to be the first metatarsal may be actually the second. He also has a fluid collection deep to the calcaneus at the level of the calcaneal spur which could be an abscess or due to adventitial bursitis. He had a small tear in his Achilles 11/30/16; the patient continues on daptomycin as directed by infectious disease at [facility]. He is been revascularized by Dr. Andree Elk at [facility] in [place]. He has been to see Gretta Arab who was the orthopedic surgeon who did his original amputation. I have not seen his not however per the patient's wife he did not offer another surgical local surgical prodecure to remove involved bone. He verbalized his usual disbelief in not just hyperbarics but any medical therapy for this condition(osteomyelitis). I discussed this in detail with the patient today including answering the question about a BKA definitively "curing" the current condition. 12/07/16; the patient continues on daptomycin as directed by infectious disease at [facility]. This is directed at the MRSA that we cultured from his bone debridement from 12/22. Lab work today shows a white count of 8.7 hemoglobin of 10.5 which is microcytic and hypochromic differential count shows a slightly elevated monocyte count at 1.2 eosinophilic count of 0.6. His creatinine is 1.11  sedimentation rate apparently is gone from 35-34 now 40. I explained was wife I don't think this represents a trend. His total CK is 48 12/14/16- patient is here for follow-up evaluation of his right TMA site. He continues to receive IV daptomycin per infectious disease. His serum inflammatory markers remain elevated. He complains of intermittent pain to the medial aspect of the TMA site with intermittent erythema. He voices no complaints or concerns regarding hyperbaric therapy. Overall he and his wife are expressing a frustration and discouragement regarrding the length of time of treatment. 12/21/16; small open wound at roughly the first or second metatarsal metatarsalphalyngeal joint reminiscence of his transmetatarsal amputation site he continues to receive IV daptomycin per infectious disease at [facility]. He will finish these a week tomorrow. He has lab work which I been copied on. His white count is 10.8 hemoglobin 8.9 MCV is low at 75., MCH low at 24.5 platelet count slightly elevated at 626. Differential count shows 70% neutrophils 10% monocytes  and 10% eosinophils. His comprehensive metabolic panel shows a slightly low sodium at 133 albumin low at 3.1 total CK is normal at 42 sedimentation rate is much higher at 82. He has had iron studies that show a serum iron of 15 and iron binding capacity of 238 and iron saturation of 6. This is suggestive of iron deficiency. B12 and folate were normal ferritin at 113 The patient tells me that he is not eating well and he has lost weight. He feels episodically nauseated. He is coughing and gagging on mucus which she thinks is sinusitis. He has an appointment with his primary doctor at 5:00 this afternoon in [facility] 01/02/17; the patient developed a subacute pneumonitis. He was admitted to [facility] after a CT scan showed an extensive interstitial pneumonitis [I have not yet seen this]. 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 He has since been discharged. He is not on oxygen. He feels fatigued and very short of breath with exertion. At [facility] the wound care nurse there felt that his wound was healed. He did complete his daptomycin and has follow-up with infectious disease on Friday. X-rays I did before he went to [facility] still suggested residual osteomyelitis in the anterior aspect of the second must metatarsal head. I'm not sure I would've expected any different. There was no other findings. I actually think I did this because of erythema over the first metatarsal head reminiscent 01/11/17; the patient has eosinophilic pneumonitis. He has been reviewed by pulmonology and given clearance for hyperbarics at least that's what his wife says. I'll need to see if there is note in care everywhere. Apparently the prognosis for improvement of daptomycin induced eosinophilic granulocyte is is 3 months without steroids. In the meantime infectious disease has placed him on doxycycline until the wound is closed. He is still is lost a lot of weight and his blood sugars are running in the mid 60s to low 80s fasting and at all times during the day 01/18/17; he has had adjustments in his insulin apparently his blood sugars in the morning or over 100. He wants to restart his hyperbaric treatment we'll do this at 1:00. 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 I think there is good reason to complete his treatments in order to give him the best chance of maintaining a healed status and these DFU 3 wounds with MRSA infection in the bone 02/15/17; the patient was seen today in conjunction with HBO. He completed hyperbaric oxygen today. The open area on his transmetatarsal site has remained closed. There was an area of erythema when I saw him earlier in the week on the posterior  heel although that is resolved as of today as well. He has been using a cam walker. This is a patient who came to [place] after a transmetatarsal amputation that was necrotic and dehisced. He required revascularization percutaneously on 2 different occasions by Dr. Andree Elk of invasive cardiology at [facility]. 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 He went to [facility] infectious disease and had IV daptomycin for 5 weeks before developing eosinophilic pneumonitis and requiring an admission to hospital/ICU. He made a good recovery and is continued on doxycycline since. As mentioned his foot is closed now. He has a follow-up with Dr. Andree Elk  tomorrow. He is going to [employer] on Monday for a custom-made shoe READMISSION Last visit Dr. Andree Elk [facility] 02/16/17 1. Critical limb ischemia of the RLE: s/p transmetatarsal amputation of the RLE, now completely healed. s/p right ATA percutaneous revascularization procedure x2, most recently 11/20/2016 s/p PTA of right AT 100% to less than 20% with a 2.5 x 200 balloon. He does have some residual osteomyelitis, but given the wound is closed, the orthopedist has recommended to follow. He has a fitting for a special shoe coming up next week. He has completed a course of daptomycin and doxycycline and he has been discharged by ID. He has completed a course of hyperbaric therapy. Continue Continue medical management with aspirin, Plavix and statin therapy. We will discuss ongoing Plavix therapy at follow-up in 6 months. 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 Given no symptoms, we will conservatively manage. He doesn't want to do any more invasive studies at this time, which is reasonable. The patient arrives today out of 2 concerns both on the  right transmetatarsal site. 1 at the level of the reminiscent fifth metatarsal head and the other at roughly the first or second. Both of these look like dark subcutaneous discoloration probably subdermal bleeding. He is recently obtained new adaptive footwear for the right foot. He also has a callus on the left fifth dorsal toe however he follows with podiatry for this and I don't think this is any issue. ABIs in this clinic today were 0.66 on the right and 1.06 on the left. On entrance into our clinic initially this was 0.95 and 0.92. He does not describe current claudication. They're going away on a cruise in 8 weeks and I think are trying to do the month is much as they can proactively. They follow with podiatry and have an appointment with Dr. Andree Elk in October READMISSION 06/25/18 This is a patient that we have not seen in almost a year. He is a type II diabetic with known PAD. He is followed by Dr. Andree Elk of interventional cardiology at [facility] in [place]. He is required revascularization for significant PAD. When we first saw him he required a transmetatarsal amputation. He had underlying osteomyelitis with a nonhealing surgical wound. This eventually closed with wound care, IV antibiotics and hyperbaric oxygen. They tell me that he has a modified shoe and he is very active walking up to 4 miles a day. He is followed by Dr. Geroge Baseman of podiatry. His wife states that he underwent a removal of callus over this site on July 9. She felt there may be drainage from this site after that although she could never really determined and there was callus buildup again. On 06/01/18 there was pressure bleeding through the overlying callus. he was given a prescription for 7 days of Bactrim. Fortuitously he has an appointment with Dr. Andree Elk on 06/04/18 and he immediately underwent revascularization of the right leg although I have not had a chance to review these records in care everywhere. This was apparently  done through anterior and retrograde access. On 06/06/18 he had another debridement by Dr. Bernette Mayers. Vitamin soaking with Epsom salts for 20 minutes and applying calcium alginate. The original trans-met was on April 2017 I believe by Dr. Doran Durand. His ABI in our clinic was noncompressible today. 07/02/18; x-ray I ordered last week was negative for osteomyelitis. Swab culture was also negative.  He is going to require an MRI which I have ordered today. 07/09/18; surprisingly the MRI of the foot that I ordered did not show osteomyelitis. He did suggest the possibility of cellulitis. For this reason I'll go ahead and give him a 10 day course of doxycycline. Although the previous culture of this area was negative 07/16/18; it arrives with the wound looking much the same. Roughly the same depth. He has thick subcutaneous tissue around the wound orifice but this still has roughly the same depth. Been using silver alginate. We applied Oasis #1 today 07/23/2018; still having to remove a lot of callus and thick subcutaneous tissue to actually define the wound here. Most of this seems to have closed down yet he has a comma shaped divot over the top of the area that still I think is open. We applied Oasis #2 His wife expressed concern about the tip of his left great toe. This almost looks like a small blister. She also showed it today to her podiatrist Dr. Geroge Baseman who did not think this was anything serious. I am not sure is anything serious either however I think it bears some watching. He is not in any pain however he is insensate 07/30/2018;; still on a lot of nonviable tissue over the surface of the wound however cleaning this up reveals a more substantial wound orifice but less of a probing wound depth. This is not probed to bone. There is no evidence of infection The wife is still concerned about a non-open area on the tip of his left great toe. Almost feels like a bony outgrowth. She had previously showed this  to podiatry. I do not think this is a blister. A friction area would be possible although he is really not walking according to his wife. He had a small skin tag on his right buttock but no open wound here either 08/06/2018; we applied a third Oasis last week. Unfortunately there is really no improvement. Still requiring extensive debridement to expose the wound bed from a horizontal slitlike depression. I still have not been able to get this to fill in properly. On the positive side there is now no probable bone from when he first came into the facility. I changed him to silver alginate today after a reasonably aggressive debridement 08/13/2018; once again the patient comes in with skin and subcutaneous tissue closing over the small probing area with the underlying cavity of the wound on the right TMA site. I applied silver alginate to this last week. Prior to that we used Oasis x3 still not able to get this area granulating. He does not have a probing area of the bone which is an improvement from when I spur started working on this and an MRI did not suggest osteomyelitis. I do not see evidence of infection here but I am increasingly concerned about why I cannot get this area to granulate. Each time I debrided this is looks like this is simply a matter of getting granulation to fill in the hole and then getting epithelialization. This does not seem to happen Also I sent him back to see podiatry Dr. Earleen Newport about the what felt to be bony outgrowth on the tip of his left great toe. Apparently after heel he left the clinic last week or the next day he developed a blood blister. He did see Dr. Earleen Newport. He went on to have a debridement of the medial nail cuticle he now has an open area here as well as  some denuded skin. They did an x-ray apparently does have a bony outgrowth or spur but I am not able to look at this. They are using topical antibiotics apparently there was some suggested he use Santyl.  Patient's wife was anxious for my opinion of this 08/20/2018; we are able to keep the wound open this time instead of the thick subcutaneous tissue closing over the top of it however unfortunately once again this probes to bone. I do not see any evidence of infection and previous MRI did not show osteomyelitis. I elected to go back to the Oasis to see if we can stimulate some granulation. Last saw his vascular interventional cardiologist Dr. Andree Elk at the beginning of August and he had a repeat procedure. Nevertheless I wonder how much blood flow he has down to this area With regards to the left first toe he is seeing Dr. Earleen Newport next week. They are applying Bactroban to this area. 08/27/2018 Once again he comes in with thick eschar and subcutaneous tissue over the top of the small probing hole. This does not appear to go down to bone but it still has roughly the same depth. I reapplied Oasis today Over the left great toe there appears to be more of the wound at the tip of his toe than there was last week. This has an eschar on the surface of it. Will change to Santyl. There is seeing podiatry this afternoon 09/03/2018 He comes in today with the area on the transmetatarsal's site with a fair amount of callus, nonviable tissue over the circumference but it was not closed. I removed all of this as well as some subcutaneous debris and reapplied Oasis. There is no exposed bone The area over the tip of the left great toe started off as a nodule of uncertain etiology. He has been followed with podiatry. They have been removing part of the medial nail bed. He has nonviable tissue over the wound he been using Santyl in this area 09/10/18 Unfortunately comes in with neither wound area looking improved. The transmetatarsal amputation site once Again has nonviable debris over the surface requiring debridement. Unfortunately underneath this there is nothing that looks viable and this once again goes right down to  bone. There is no purulent drainage and no erythema. Also the surgical wound from podiatry on the left first toe has an ischemic-looking eschar over the surface of the tip of the toe I have elected not to attempt candidly debride this His wife as arranged for him to have follow-up noninvasive studies in [place] under the care of Dr. Andree Elk clinic. The question is he has known severe PAD. They had recently seen him in August. He has had revascularizations in both legs within the last 4 or 5 months. He is not complaining of pain and I cannot really get a history of claudication. He has not systemically unwell 09/20/2018 the patient has been to [facility] and been revascularized by Dr. Andree Elk earlier this week. Apparently he was able to open up the anterior tibial artery although I have not actually seen his formal report. He is going for an attempt to revascularize on the left on Monday. He is apparently working with an investigational stent for lower extremity arteries below the knee and he has talked to the patient about placing that on Monday if possible. The patient has been using silver alginate on the transmetatarsal amputation site and Santyl on the left 09/30/2018; patient had his revascularization on the left this apparently  included a standard approach as well as a more distal arterial catheterization although I do not have any information on this from Dr. Andree Elk. In fact I do not even see the initial revascularization that he had on the right. I have included the arterial history from Dr. Andree Elk last note however below; ASSESSMENT/PLAN: 1. Hx of Critical limb ischemia bilateral lower extremities, PAD: -s/p transmetatarsal amputation of the RLE. -s/p right ATA percutaneous revascularization procedure x2, most recently 11/20/2016 s/p PTA of right AT 100% to less than 20% with a 2.5 x 200 balloon. -On original angiogram 05/15/2016, he had a significant 70-95% left popliteal artery stenosis with  AT and PT artery occlusions and one vessel runoff via the peroneal artery. -03/21/2018 s/p PTA of 90% left popliteal artery to <10% with a 5x20 cutting balloon, PTA of 90% left peroneal to <20% with a 3x20 balloon, PTA of 100% left AT to <20% with a 2.5x220 balloon. 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 Will plan on the LLE soon after. Risks/benefits of procedure have been discussed and patient has elected to proceed. We have been using endoform to the right TMA amputation site wound and Santyl to the left great toe 10/07/2018; the area on the tip of his left great toe looked better we have been using Santyl here. We continue to have a very difficult probing hole on the right TMA amputation site we have been using endoform. I went on to use his fifth Oasis today 10/14/2018; the tip of the left great toe continues to look better. We have been using Santyl here the surface however is healthy and I think we can change to an alginate. The right TMA has not changed. Once again he has no superficial opening there is callus and thick subcutaneous tissue over the orifice once you remove this there is the probing area that we have been dealing with without too much change. There is no palpable bone I have been placing Oasis here and put Oasis #6 in this today after a more vigorous debridement 10/21/18; the left great toe still has necrotic surface requiring debridement. The right TMA site hasn't changed in view of the thick callus over the wound bed. With removal of this there is still the opening however this does not appear to have the same depth. Again there is no palpable bone. Oasis was replaced 10/28/2018; patient comes in with both wounds looking worse. The area over the first toe tip is now down to bone. The area over the TMA site is deeper and down to  bone clearly with a increase in overall wound area. Equally concerning on the right TMA is the complete absence of a pulse this week which is a change. We are not even able to Doppler this. On the right he has a noncompressible ABI greater than 1.4 11/04/2018. Both wounds look somewhat worse. X-rays showed no osteomyelitis of the right foot but on the left there was underlying osteomyelitis in the left great toe distal phalanx. I been on the phone to Dr. Andree Elk surface at [facility] in [place] and they are arranging for another angiogram on the right on Monday. I have him on doxycycline for the osteomyelitis in the left great toe for now. Infectious disease may be necessary 1/17; 2-week hiatus. Patient was admitted to hospital at [address]. My understanding is he  underwent an angioplasty of the right anterior tibial artery and had stents placed in the left anterior artery and the left tibial peroneal trunk. This was done by Dr. Andree Elk of interventional radiology. There is no major change in either 1 of the wounds. They have been using Aquacel Ag. As far as they are aware no imaging studies were done of the foot which is indeed unfortunate. I had him on doxycycline for 2 weeks since we identified the osteomyelitis in the left great toe by plain x-ray. I have renewed that again today. I am still suspicious about osteomyelitis in the amputation site and would consider doing another MRI to compare with the one done in September. The idea of hyperbaric oxygen certainly comes up for discussion 1/24; no major change in either wound area. I have him on doxycycline for osteomyelitis at the tip of the left great toe. As noted he has been previously and recently revascularized by Dr. Andree Elk at [place]. He is tolerating the doxycycline well. For some reason we do not have an infectious disease consult yet. Culture of drainage from the right foot site last week was negative 1/31; MRI of the right foot did not show  osteomyelitis of the right ankle and foot. Notable for a skin ulceration overlying the second metatarsal stump with generalizing soft tissue edema of the ankle and foot consistent with cellulitis. Noted to have a partial-thickness tear of the Achilles tendon 7.5 cm proximal to the insertion Nothing really new in terms of symptoms. Patient's area on the tip of the left great toe is just about closed although he still has the probing area on the metatarsal amputation site. Appointment with Dr. Linus Salmons of infectious disease next week 2/7; Dr. Novella Olive did not feel that any further antibiotics were necessary he would follow-up in 2 months. The left great toe appears to be closed still some surface callus that I gently looked under high did not see anything open or anything that was threatening to be open. He still has the open area on the mid part of his TMA site using endoform 2/14; left great toe is closed and he is completing his doxycycline as of last Sunday. He is not on any antibiotics. Unfortunately out of the right foot his wife noticed some subdermal hemorrhage this week. He had been walking 2 miles I had given him permission to do so this is not really a plantar wound. Using endoform to this wound but I changed to silver alginate this week 2/21; left great toe remains closed. Culture last week grew Streptococcus angiosis which I am not really familiar with however it is penicillin sensitive and I am going to put him on Augmentin. Not much change in the wound on the right foot the deep area is still probing precariously close to bone and the wound on the margin of the TMA is larger. 2/28; left great toe remains closed. He is completing the Augmentin I gave him last week. 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 This is being shown to Dr. Andree Elk at [place] to see if there is anything else that can be done here. He has been using silver alginate strips on the  right 3/6; left great toe remains closed. He sees Dr. Andree Elk on Monday. The area on the plantar aspect of the right foot has the same small orifice with thick callused tissue around this. However this time with removal of the callus tissue the wound is open all  the way along the incision line to the end medially. We have been using silver alginate. 3/13; left toe remains closed although the area is callused. He sees Dr. Jacqualyn Posey of podiatry next week. The area on the plantar right foot looked a lot better this week. Culture I did of this was negative we use silver alginate. He is going next week for an attempt at revascularization by Dr. Andree Elk 3/23; left toe remains closed although the area is callused. He will see Dr. Jacqualyn Posey in follow-up. The area on the right plantar foot continues to look surprisingly better over the last 3 visits. We have been using silver alginate. The revascularization he was supposed to have by Dr. Andree Elk at [facility] in [place] has been canceled [facility] procedure] 4/6; the right foot remains closed albeit callused. Podiatry canceled the appointment with regards to the left great toe. He has not seen Dr. Andree Elk at [facility] but thinks that Dr. Andree Elk has "done all he can do". He would be a candidate for the hemostaemix trial Readmission 07/29/2019 Mr. Stann Mainland is a man we know well from at least 3 previous stays in this clinic. He is a type II diabetic with severe PAD followed by Dr. Andree Elk at [facility] in [place]. 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 So far everything in both of these areas has remained closed. About 2 weeks ago he went to see his podiatrist at [facility] Dr. Babs Bertin. He had a thick callus on the left fifth metatarsal head that was shaved. He has developed an open wound in this area. They have been offloading this in  his diabetic shoes. The patient has not had any more revascularizations by Dr. Andree Elk since the last time he was here. ABI in our clinic at the posterior tibial on the left was 1.06 10/6; no real change in the area on the plantar met head. Small wound with 2 mm of depth. He has thick skin probably from pressure around the wound. We have been using silver alginate 10/13; small wound in the left fifth plantar met head. Arrives today with undermining laterally and purulent drainage. Our intake nurse cultured this. His wife stated they noticed a change in color over the last day or 2. He is not systemically unwell 10/19; small wound on the fifth plantar metatarsal head. Culture I did last week showed Staphylococcus lugdunensis. Although this could be a skin contaminant the possibility of a skin and soft tissue infection was there. I did give him empiric doxycycline which should have covered this. We are using silver alginate to the wound. We put him in a total contact cast today. 10/22; small wound on the fifth plantar metatarsal head. He has completed antibiotics. He also has severe PAD which worries me about just about any wound on this man's foot 10/29; small superficial area on the fifth plantar metatarsal head. Measuring slightly smaller. He is not currently on any antibiotics. I been using a total contact cast in this man with very severe PAD but he seems to be tolerating this well 11/5; left plantar fifth metatarsal head. Silver alginate being used under a total contact cast 11/12; wound not much different than last week. Using a #15 scalpel debridement around the wound. Still silver collagen under a total contact cast. He has severe PAD and that may be  playing a role in this 11/19; disappointing that the wound is not really changed that much. I think it is come down in overall surface area because originally this was on the lateral part of the fifth metatarsal head however recently it is not  really changed. Most of this is filled in but it will not epithelialized there is surface debris on this which may be mostly related to ischemia. They have an appointment with Dr. Andree Elk on 12/9 09/24/2019 on evaluation today patient appears to be doing somewhat better with regard to the wound on the left fifth metatarsal head. Fortunately there does not appear to be any signs of active infection at this time. No fevers, chills, nausea, vomiting, or diarrhea. The wound does not appear to be completely closed and I do feel like the Hydrofera Blue is helping to some degree although I feel like the cast as well as keeping things from breaking down or getting any larger at least. As far as his wound overview it shows that the overall size of the wound is slightly smaller but really maintaining within the realm of about the same. He had no troubles with the cast which is good news. 12/1; left fifth metatarsal head. Perhaps somewhat more vibrant. We have been using Hydrofera Blue. He sees Dr. Andree Elk at [facility] 1 week tomorrow. He be back next week and I hope to have a better idea which way the wound is going however I will not cast him next week in case Dr. Andree Elk wants to reevaluate things in his foot. The left leg is the leg with the stent in place and I wonder whether these are going to have to be reevaluated. 12/8; left fifth metatarsal head. Quite a bit better this week. Only a small open area remains. He sees Dr. Andree Elk at [place] tomorrow. Dr. Andree Elk is previously done his vascular interventions. He has 2 stents in the left leg as I remember things. I therefore will not put him in a total contact cast today. He will be using a forefoot off loader. We will use silver collagen on the wound. We will see him again next week 12/15; left fifth metatarsal head. He saw Dr. Andree Elk and is scheduled for an angiogram on Thursday. Unfortunately although his wound was a lot better last week it is really deteriorated this week.  Small punched-out hole with depth and overhanging tissue. We have been using Hydrofera Blue 12/22; patient had an 80% stenosis proximal to the stent I believe in the below-knee popliteal artery. This was opened by Dr. Andree Elk. According the patient the stent itself was satisfactory. I have not been able to review this note. 12/29; patient arrives today with the wound about the same size some undermining medially. We have been using Hydrofera Blue under a total contact cast and that is what we will do again today 11/04/2019. Slightly smaller in orifice but about 4 mm undermining almost circumferentially. We have been using Hydrofera Blue. Apparently the Hydrofera Blue did not stay on the wound after we removed the cast. Some minor looking cast irritation on the right lateral 1/12; unfortunately things did not go well this week. Arrives in clinic today with a deeper wound with undermining more concerning a area of pus. Specimen was obtained for culture. We are not going to be able to put him in a cast today. 1/19; purulent material last week which I cultured showed Enterobacter cloacae. He was on ampicillin previously prescribed by his primary doctor which should  not have covered this however mysteriously the wound looks somewhat better. There is less depth less erythema certainly no drainage. He will need a course of ciprofloxacin which I will provide today. 1/26; he has completed the ciprofloxacin. I don't think he needs any additional antibiotics. The areas on the left fifth plantar met head. We've been using silver alginate 2/2; comes in today with 0.4 cm of circumferential undermining around a small wound in this area. We have been using silver alginate with a forefoot offloading boot 2/9; again the wound appears larger nonviable surface and with 0.4 cm roughly of circumferential undermining. We have been using silver alginate with a forefoot offloading boot. The wound does not look infected however  his wife is quick to point out about swelling on the dorsal foot. 2/16; not much change. Comes in with a small wound with a nonviable necrotic surface. Again marked undermining that I had totally removed last week. He had a arterial Doppler last week in Dr. Andree Elk office at [facility]. They have not heard these results. They are somewhat frustrated. 2/23; if anything this is worse. Undermining increased depth. Nonviable surface that looks pale. According his wife is TBI on the right was 0.22 although I have not verified this. He is going for an angiogram with Dr. Andree Elk next Monday. We are using polymen and offloading in a forefoot offloading boot. 3/2; the patient was revascularized yesterday by [facility]. He had successful PTA of the left peroneal 60% to less than 20% and successful PTA of the left ATA 100% to less than 20%. He now has a pulse in the left foot Now that he has some additional blood flow there is not a lot of options here. On one hand we want activity to help keep blood flow augmented on the other hand pressure relief is going to be paramount if we have any chance to get this to close. After some discussion with his wife and patient I went ahead and put him in a total contact cast. 3/9; he arrives in clinic today with some debris over the wound surface. We use silver collagen under a total contact cast after his revascularization by Dr. Andree Elk 3/16; wound about the same depth at 0.5 cm. Surface area perhaps slightly less but it is the depth the really is important. We have been using silver collagen 3/23; not much change here. There is still undermining medially the depth is about the same tissue looks somewhat better. We switched to Iodoflex last week to try to get a better looking wound surface. He does have a small abrasion over the proximal interphalangeal joint dorsally. I this is so small it is difficult even to tell whether it is open. Some abrasion between the  fifth toe and the webspace as well 3/30; if anything the wound is deeper this week and larger. Very disappointing. Under illumination debris on the surface which I debrided gently with a #3 curette there is minimal bleeding. We had been using iodoflex under a total contact cast. I am going to take him out of the cast and change him to [place] today which his wife will change daily. 4/6; wound bed looks better. Still undermining medially. Some maceration and erythema around the wound. We have been using Santyl change daily. I took him out of a total contact cast last week 4/13; patient's culture from last time grew MSSA. The wound is larger not a very viable surface. Duplex ultrasound I did last week  rule out DVT was negative. He still has a fair amount of pitting edema in the lower leg and dorsal foot but I am not really certain why. I started him on Keflex 500 every 6 for 10 days starting yesterday His wife tells me that Dr. Andree Elk office in [place] has been in contact they want to look at restudying I believe to see if there is further procedures planned. I told his wife he still has a feeble dorsalis pedis pulse certainly not as robust as after the last procedure although this could be partially because of edema 4/20; he has completed his antibiotics 2 weeks worth of Keflex for MSSA. He has necrotic material over a large portion of this wound. The swelling in his leg is come down presumably resolved with treatment of the infection. He will have a arterial ultrasound by Dr. Andree Elk on Friday and apparently they see him later that day. We are using Bactroban and silver alginate 4/27 patient had his arterial Dopplers ABIs and TBI's done by Dr. Andree Elk. He was noted to have a moderate to significant stenosis involving the left proximal superficial artery consistent with a 50 to 69% diameter reduction. The left anterior tibial artery including the dorsalis pedis was patent. There was no flow in the distal  posterior tibial artery monophasic flow within the distal peroneal artery. The great toe index was 0.32 his absolute great toe pressure was 38 arterial flow was detected by PPG within all 5 digits. He is due to have angiography again on 5/13 Dr. Andree Elk put him back on 2 weeks worth of Keflex. The wound has made absolutely no progress. Interestingly it is more centered over the lateral part of his foot started more on the plantar aspect. 5/4; he is going for his procedure a week Thursday. We will see him back next week he is using Santyl with calcium alginate. His MRI is not toe the 17th. 5/11; patient sees Dr. Andree Elk for angiography on Thursday of this week. His MRI is on 5/17. If his MRI is positive for osteomyelitis he will need IV antibiotics and hyperbarics he is prepared for this eventuality. If it is not positive we are going to probably have to try an advanced treatment option 5/18; patient was revascularized by Dr. Andree Elk and he was able to get inline flow through the anterior tibial artery down into the patient's foot. Unfortunately his MRI showed osteomyelitis of the fifth metatarsal head and base of the fifth proximal phalanx with possible septic arthritis versus a reactive fifth MTP joint effusion. After discussion with the patient we talked with Dr. Andree Elk at [place] who is graciously going to electively admit the patient for IV antibiotics orthopedic consultation. We will standby and await discharge consideration of hyperbaric oxygen. Electronic Signature(s) Signed: 03/18/2020 12:52:55 PM By: [name] MD Entered By: [name] on 03/16/2020 14:21:35 -------------------------------------------------------------------------------- Physical Exam Details Patient Name: Date of Service: Ileana Roup, Carola Frost YE 03/16/2020 12:30 PM Medical Record Number: [id number] Patient Account Number: [id number] Date of Birth/Sex: Treating RN: [birth year] (73 y.o. [name] Primary Care Provider:  [name] Other Clinician: Referring Provider: Treating Provider/Extender: Leonette Nutting in Treatment: 33 Constitutional Sitting or standing Blood Pressure is within target range for patient.. Pulse regular and within target range for patient.Marland Kitchen Respirations regular, non-labored and within target range.. Temperature is normal and within the target range for the patient.Marland Kitchen Appears in no distress. Cardiovascular As usual post vascular intervention his dorsalis pedis is palpable. Notes  Wound exam; there is less surface debris and more vibrant looking tissue. There is a blister on the medial first metatarsal head this that itself is worrisome small blackened area at the tip of the right first toe perhaps an embolic area Electronic Signature(s) Signed: 03/18/2020 12:52:55 PM By: [name] MD Entered By: [name] on 03/16/2020 14:23:41 -------------------------------------------------------------------------------- Physician Orders Details Patient Name: Date of Service: Ileana Roup, Carola Frost YE 03/16/2020 12:30 PM Medical Record Number: [id number] Patient Account Number: [id number] Date of Birth/Sex: Treating RN: 04/17/1946 (73 y.o. [name] Primary Care Provider: [name] Other Clinician: Referring Provider: Treating Provider/Extender: Leonette Nutting in Treatment: 33 Verbal / Phone Orders: No Diagnosis Coding ICD-10 Coding Code Description E11.621 Type 2 diabetes mellitus with foot ulcer E11.51 Type 2 diabetes mellitus with diabetic peripheral angiopathy without gangrene L97.528 Non-pressure chronic ulcer of other part of left foot with other specified severity Follow-up Appointments Return Appointment in 1 week. Dressing Change Frequency Wound #5 Left Metatarsal head fifth Change dressing every day. Wound Cleansing Wound #5 Left Metatarsal head fifth May shower and wash wound with soap and water. Primary Wound  Dressing lginate with Silver - to blister on left 1 st met head Calcium A Wound #5 Left Metatarsal head fifth lginate - over santyl Calcium A Santyl Ointment Other: - pad right great toe for protection Secondary Dressing Foam - secure with tape Off-Loading Open toe surgical shoe to: - with felt. apply to left foot. Additional Orders / Instructions Other: - closely monitor redness. Electronic Signature(s) Signed: 03/16/2020 5:13:38 PM By: [name] RN Signed: 03/18/2020 12:52:55 PM By: [name] MD Entered By: [name] on 03/16/2020 14:03:05 -------------------------------------------------------------------------------- Problem List Details Patient Name: Date of Service: Ileana Roup, Carola Frost YE 03/16/2020 12:30 PM Medical Record Number: [id number] Patient Account Number: [id number] Date of Birth/Sex: Treating RN: Jan 23, 1946 (73 y.o. [name] Primary Care Provider: [name] Other Clinician: Referring Provider: Treating Provider/Extender: Leonette Nutting in Treatment: 33 Active Problems ICD-10 Encounter Code Description Active Date MDM Diagnosis E11.621 Type 2 diabetes mellitus with foot ulcer 07/29/2019 No Yes E11.51 Type 2 diabetes mellitus with diabetic peripheral angiopathy without gangrene 07/29/2019 No Yes L97.528 Non-pressure chronic ulcer of other part of left foot with other specified 02/24/2020 No Yes severity M86.172 Other acute osteomyelitis, left ankle and foot 03/16/2020 No Yes Inactive Problems ICD-10 Code Description Active Date Inactive Date L97.521 Non-pressure chronic ulcer of other part of left foot limited to breakdown of skin 07/29/2019 07/29/2019 L03.116 Cellulitis of left lower limb 08/12/2019 08/12/2019 Resolved Problems Electronic Signature(s) Signed: 03/18/2020 12:52:55 PM By: [name] MD Entered By: [name] on 03/16/2020  14:20:08 -------------------------------------------------------------------------------- Progress Note Details Patient Name: Date of Service: Ileana Roup, Carola Frost YE 03/16/2020 12:30 PM Medical Record Number: [id number] Patient Account Number: [id number] Date of Birth/Sex: Treating RN: 21-Apr-1946 (73 y.o. [name] Primary Care Provider: [name] Other Clinician: Referring Provider: Treating Provider/Extender: Leonette Nutting in Treatment: 33 Subjective History of Present Illness (HPI) 05/18/16; this is a 74year-old diabetic who is a type II diabetic on insulin. The history is that he traumatized his right foot developed a sore sometime in late March. 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 According to the patient  and his wife this wound never really healed. He was seen on 2 occasions in the wound care center in [place] and had vascular studies and then was referred urgently to Dr. Bridgett Larsson of vascular surgery. He underwent an angiogram on 05/10/16. Unfortunately nothing really could be done to improve his vascular status. He had a 75- 90% stenosis in the midsegment of 1 segment of the posterior femoral artery. He had a patent popliteal, his anterior tibial occluded shortly after takeoff. Perineal had a greater than 90% stenosis posterior tibial is occluded feet had no distal collaterals feed distal aspect of the transmetatarsal amputation site. The patient tells me that he had a prolonged period of Santyl by Dr. Doran Durand was some initial improvement but then this was stopped. I think they're only applying daily dressings/dry dressings. He has not had a recent x-ray of the right foot he did have one before his surgery in April. His wife by the  dimensions of the wound/surgical site being followed at home since 4/20. At that point the dimensions were 0.5 x 12 x 0.2 on 7/19 this was 1.8 x 6 x 0.4. He is not currently on any antibiotics. His hemoglobin A1c in early April was 12.9 at that point he was started on insulin. Apparently his blood sugars are much lower he has an appointment with Dr. Legrand Como Alteimer of endocrine next week. 05/29/16 x-ray of the area did not show osteomyelitis. I think he probably needs an MRI at this point. His wife is asking about something called"Yireh" cream which is not FDA approved. I have not heard of this. 06/22/16; MRI did not really suggest osteomyelitis. There was minimal marrow edema and enhancement in the stump of the second metatarsal felt to be secondary likely to postoperative change rather than osteomyelitis. The patient has arranged his own consultation with Dr. Andree Elk at [place], apparently their daughter lives in [place] and has some connection here. Any improvement in vascular supply by Dr. Andree Elk would of course be helpful. Dr. Bridgett Larsson did not feel that anything further could be done other than amputation if wound care did not result in healing or if the area deteriorates. 06/26/16; the patient has been to see Dr. Andree Elk at [place] and had an angiogram. He is going for a procedure on Thursday which will involve catheterization. I'm not sure if this is an anterograde or retrograde approach. He has been using Santyl to the wound 07/10/16; the patient had a repeat angiogram and angioplasty at [place] by Dr. Brunetta Jeans. His angiogram showed right CFA and profundal widely patent. The right as of a.m. popliteal artery were widely patent the right anterior tibial was occluded proximally and reconstitutes at the ankle. Peroneal artery was patent to the foot. Posterior tibial artery was occluded. The patient had angioplasty of the anterior tibial artery.. This was quite successful. He was recommended for Plavix as well as  aspirin. 07/17/16; the patient was close to be a nurse visit today however the outer dressing of the Apligraf fell off. Noted drainage. I was asked to see the wound. The patient is noted an odor however his wife had noted that. Drainage with Apligraf not necessarily a bad thing. He has not been systemically unwell 07/24/16; we are still have an issue with drainage of this wound. In spite of this I applied his second Apligraf. Medially the area still is probing to bone. 08/07/16; Apligraf reapplied in general wound looks improved. 08/21/16 Apligraf #4. Wound looks much better 09/04/16 patientt's wound again today  continues to appear to improve with the application of the Apligraf's. He notes no increased discomfort or concerns at this point in time. 09/18/16; the patient returns today 2 weeks after his fifth application of Apligraf. Predictably three quarters of the width of this wound has healed. The deep area that probe to bone medially is still open. The patient asked how much out-of-pocket dollars would be for additional Apligraf's. 09/25/16; now using Hydrofera Blue. He has completed 5 Apligraf applications with considerable improvement in this deep open transmetatarsal amputation site. His wound is now a triangular-shaped wound on the medial aspect. At roughly 12 to 2:00 this probes another centimeter but as opposed to in the past this does not probe to bone. The patient has been seen at [place] by Dr. Zenia Resides. He is not planning to do any more revascularization unless the wound stalls or worsens per the patient 10/02/16; 0.7 x 0.8 x 0.8. Unfortunately although the wound looks stable to improved. There is now easily probable bone. This hasn't been present for several weeks. Patient is not otherwise symptomatic he is not experiencing any pain. I did a culture of the wound bed 10/09/16. Deterioration last week. Culture grew MRSA and although there is improvement here with doxycycline prescribed over the  phone I'm going to try to get him linezolid 600 twice a day for 10 days today. 10/16/16; he is completing a weeks worth of linezolid and still has 3 more days to go. Small triangular-shaped open area with some degree of undermining. There is still palpable bone with a curet. Overall the area appears better than last week 10/20/16 he has completed the linezolid still having some nausea and vomiting but no diarrhea. He has exposed bone this week which is a deterioration. 10/27/16 patient now has a small but probing wound down to bone. Culture of this bone that I did last week showed a few methicillin-resistant staph aureus. I have little doubt that this represents acute/subacute osteomyelitis. The patient is currently on Doxy which I will continue he also completed 10 days of linezolid. We are now in a difficult situation with this patient's foot after considerable discussion we will send him back to see Dr. Doran Durand for a surgical opinion of this I'm also going to try to arrange a infectious disease consult at [facility] hopefully week and get this prior to her usual 4-6 weeks we having [place]. The patient clearly is going to need 6 weeks of IV vancomycin. If we cannot arrange this expediently I'll have to consider ordering this myself through a home infusion company 11/03/16; the patient now has a small in terms of circumference but probing wound. No bone palpable today. The patient remains on doxycycline 100 twice a day which should support him until he sees infectious disease at [facility] next week the following week on Wednesday I believe he has an appointment with Dr. Andree Elk at [facility] who is his vascular cardiologist. Finally he has an appointment with Dr. Doran Durand on 11/22/16 we have been using silver alginate. The patient's wife states they are having trouble getting this through [facility] 11/13/16; the patient was seen by infectious disease at [facility] in the 11th PICC line placed in preparation for IV  antibiotics. A tummy he has not going to get IV vancomycin o Ceftaroline. They've also ordered an MRI. Patient has a follow-up with Dr. Doran Durand on 11/22/16 and Dr. Andree Elk at [facility] tomorrow 11/23/16 the patient is on daptomycin as directed by infectious disease at [facility]. He is  also been back to see Dr. Andree Elk at [facility]. He underwent a repeat arteriogram. He had a successful PTA of the right anterior tibial artery. He is on dual antiplatelete treatment with Plavix and aspirin. Finally he had the MRI of his foot in [place]. This showed cellulitis about the foot worse distally edema and enhancement in the reminiscent of the second metatarsal was consistent with osteomyelitis therefore what I was assuming to be the first metatarsal may be actually the second. He also has a fluid collection deep to the calcaneus at the level of the calcaneal spur which could be an abscess or due to adventitial bursitis. He had a small tear in his Achilles 11/30/16; the patient continues on daptomycin as directed by infectious disease at [facility]. He is been revascularized by Dr. Andree Elk at [facility] in [place]. He has been to see Gretta Arab who was the orthopedic surgeon who did his original amputation. I have not seen his not however per the patient's wife he did not offer another surgical local surgical prodecure to remove involved bone. He verbalized his usual disbelief in not just hyperbarics but any medical therapy for this condition(osteomyelitis). I discussed this in detail with the patient today including answering the question about a BKA definitively "curing" the current condition. 12/07/16; the patient continues on daptomycin as directed by infectious disease at [facility]. This is directed at the MRSA that we cultured from his bone debridement from 12/22. Lab work today shows a white count of 8.7 hemoglobin of 10.5 which is microcytic and hypochromic differential count shows a slightly elevated monocyte  count at 1.2 eosinophilic count of 0.6. His creatinine is 1.11 sedimentation rate apparently is gone from 35-34 now 40. I explained was wife I don't think this represents a trend. His total CK is 48 12/14/16- patient is here for follow-up evaluation of his right TMA site. He continues to receive IV daptomycin per infectious disease. His serum inflammatory markers remain elevated. He complains of intermittent pain to the medial aspect of the TMA site with intermittent erythema. He voices no complaints or concerns regarding hyperbaric therapy. Overall he and his wife are expressing a frustration and discouragement regarrding the length of time of treatment. 12/21/16; small open wound at roughly the first or second metatarsal metatarsalphalyngeal joint reminiscence of his transmetatarsal amputation site he continues to receive IV daptomycin per infectious disease at [facility]. He will finish these a week tomorrow. He has lab work which I been copied on. His white count is 10.8 hemoglobin 8.9 MCV is low at 75., MCH low at 24.5 platelet count slightly elevated at 626. Differential count shows 70% neutrophils 10% monocytes and 10% eosinophils. His comprehensive metabolic panel shows a slightly low sodium at 133 albumin low at 3.1 total CK is normal at 42 sedimentation rate is much higher at 82. He has had iron studies that show a serum iron of 15 and iron binding capacity of 238 and iron saturation of 6. This is suggestive of iron deficiency. B12 and folate were normal ferritin at 113 The patient tells me that he is not eating well and he has lost weight. He feels episodically nauseated. He is coughing and gagging on mucus which she thinks is sinusitis. He has an appointment with his primary doctor at 5:00 this afternoon in [facility] 01/02/17; the patient developed a subacute pneumonitis. He was admitted to [facility] after a CT scan showed an extensive interstitial pneumonitis [I have not yet seen  this]. 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 He has since been discharged. He is not on oxygen. He feels fatigued and very short of breath with exertion. At [facility] the wound care nurse there felt that his wound was healed. He did complete his daptomycin and has follow-up with infectious disease on Friday. X-rays I did before he went to [facility] still suggested residual osteomyelitis in the anterior aspect of the second must metatarsal head. I'm not sure I would've expected any different. There was no other findings. I actually think I did this because of erythema over the first metatarsal head reminiscent 01/11/17; the patient has eosinophilic pneumonitis. He has been reviewed by pulmonology and given clearance for hyperbarics at least that's what his wife says. I'll need to see if there is note in care everywhere. Apparently the prognosis for improvement of daptomycin induced eosinophilic granulocyte is is 3 months without steroids. In the meantime infectious disease has placed him on doxycycline until the wound is closed. He is still is lost a lot of weight and his blood sugars are running in the mid 60s to low 80s fasting and at all times during the day 01/18/17; he has had adjustments in his insulin apparently his blood sugars in the morning or over 100. He wants to restart his hyperbaric treatment we'll do this at 1:00. 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 I think there is good reason to complete his treatments in order to give him the best chance of maintaining a healed status and these DFU 3 wounds with MRSA infection in the bone 02/15/17; the patient was seen today in conjunction with HBO. He completed hyperbaric oxygen today. The open area on his transmetatarsal site has remained closed. There was an area  of erythema when I saw him earlier in the week on the posterior heel although that is resolved as of today as well. He has been using a cam walker. This is a patient who came to [place] after a transmetatarsal amputation that was necrotic and dehisced. He required revascularization percutaneously on 2 different occasions by Dr. Andree Elk of invasive cardiology at [facility]. 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 He went to [facility] infectious disease and had IV daptomycin for 5 weeks before developing eosinophilic pneumonitis and requiring an admission to hospital/ICU. He made a good recovery and is continued on doxycycline since. As mentioned his foot is closed now. He has a follow-up with Dr. Andree Elk tomorrow. He is going to [employer] on Monday for a custom-made shoe READMISSION Last visit Dr. Andree Elk [facility] 02/16/17 1. Critical limb ischemia of the RLE:  s/p transmetatarsal amputation of the RLE, now completely healed.  s/p right ATA percutaneous revascularization procedure x2, most recently 11/20/2016 s/p PTA of right AT 100% to less than 20% with a 2.5 x 200 balloon.  He does have some residual osteomyelitis, but given the wound is closed, the orthopedist has recommended to follow. He has a fitting for a special shoe coming up next week. He has completed a course of daptomycin and doxycycline and he has been discharged by ID. He has completed a course of hyperbaric therapy.  Continue Continue medical management with aspirin, Plavix and statin therapy. We will discuss ongoing Plavix therapy at follow-up in 6 months.  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 Given no symptoms, we will conservatively manage. He doesn't want to do any more invasive studies at this time,  which is reasonable. The patient arrives today out of 2 concerns both on the right transmetatarsal site. 1 at the level of the reminiscent fifth metatarsal head and the other at roughly the first or second. Both of these look like dark subcutaneous discoloration probably subdermal bleeding. He is recently obtained new adaptive footwear for the right foot. He also has a callus on the left fifth dorsal toe however he follows with podiatry for this and I don't think this is any issue. ABIs in this clinic today were 0.66 on the right and 1.06 on the left. On entrance into our clinic initially this was 0.95 and 0.92. He does not describe current claudication. They're going away on a cruise in 8 weeks and I think are trying to do the month is much as they can proactively. They follow with podiatry and have an appointment with Dr. Andree Elk in October READMISSION 06/25/18 This is a patient that we have not seen in almost a year. He is a type II diabetic with known PAD. He is followed by Dr. Andree Elk of interventional cardiology at [facility] in [place]. He is required revascularization for significant PAD. When we first saw him he required a transmetatarsal amputation. He had underlying osteomyelitis with a nonhealing surgical wound. This eventually closed with wound care, IV antibiotics and hyperbaric oxygen. They tell me that he has a modified shoe and he is very active walking up to 4 miles a day. He is followed by Dr. Geroge Baseman of podiatry. His wife states that he underwent a removal of callus over this site on July 9. She felt there may be drainage from this site after that although she could never really determined and there was callus buildup again. On 06/01/18 there was pressure bleeding through the overlying callus. he was given a prescription for 7 days of Bactrim. Fortuitously he has an appointment with Dr. Andree Elk on 06/04/18 and he immediately underwent revascularization of the right leg although I have not  had a chance to review these records in care everywhere. This was apparently done through anterior and retrograde access. On 06/06/18 he had another debridement by Dr. Bernette Mayers. Vitamin soaking with Epsom salts for 20 minutes and applying calcium alginate. The original trans-met was on April 2017 I believe by Dr. Doran Durand. His ABI in our clinic was noncompressible today. 07/02/18; x-ray I ordered last week was negative for osteomyelitis. Swab culture was also negative. He is going to require an MRI which I have ordered today. 07/09/18; surprisingly the MRI of the foot that I ordered did not show osteomyelitis. He did suggest the possibility of cellulitis. For this reason I'll go ahead and give him a 10 day course of doxycycline. Although the previous culture of this area was negative 07/16/18; it arrives with the wound looking much the same. Roughly the same depth. He has thick subcutaneous tissue around the wound orifice but this still has roughly the same depth. Been using silver alginate. We applied Oasis #1 today 07/23/2018; still having to remove a lot of callus and thick subcutaneous tissue to actually define the wound here. Most of this seems to have closed down yet he has a comma shaped divot over the top of the area that still I  think is open. We applied Oasis #2 His wife expressed concern about the tip of his left great toe. This almost looks like a small blister. She also showed it today to her podiatrist Dr. Geroge Baseman who did not think this was anything serious. I am not sure is anything serious either however I think it bears some watching. He is not in any pain however he is insensate 07/30/2018;; still on a lot of nonviable tissue over the surface of the wound however cleaning this up reveals a more substantial wound orifice but less of a probing wound depth. This is not probed to bone. There is no evidence of infection The wife is still concerned about a non-open area on the tip of his left great  toe. Almost feels like a bony outgrowth. She had previously showed this to podiatry. I do not think this is a blister. A friction area would be possible although he is really not walking according to his wife. He had a small skin tag on his right buttock but no open wound here either 08/06/2018; we applied a third Oasis last week. Unfortunately there is really no improvement. Still requiring extensive debridement to expose the wound bed from a horizontal slitlike depression. I still have not been able to get this to fill in properly. On the positive side there is now no probable bone from when he first came into the facility. I changed him to silver alginate today after a reasonably aggressive debridement 08/13/2018; once again the patient comes in with skin and subcutaneous tissue closing over the small probing area with the underlying cavity of the wound on the right TMA site. I applied silver alginate to this last week. Prior to that we used Oasis x3 still not able to get this area granulating. He does not have a probing area of the bone which is an improvement from when I spur started working on this and an MRI did not suggest osteomyelitis. I do not see evidence of infection here but I am increasingly concerned about why I cannot get this area to granulate. Each time I debrided this is looks like this is simply a matter of getting granulation to fill in the hole and then getting epithelialization. This does not seem to happen Also I sent him back to see podiatry Dr. Earleen Newport about the what felt to be bony outgrowth on the tip of his left great toe. Apparently after heel he left the clinic last week or the next day he developed a blood blister. He did see Dr. Earleen Newport. He went on to have a debridement of the medial nail cuticle he now has an open area here as well as some denuded skin. They did an x-ray apparently does have a bony outgrowth or spur but I am not able to look at this. They are using  topical antibiotics apparently there was some suggested he use Santyl. Patient's wife was anxious for my opinion of this 08/20/2018; we are able to keep the wound open this time instead of the thick subcutaneous tissue closing over the top of it however unfortunately once again this probes to bone. I do not see any evidence of infection and previous MRI did not show osteomyelitis. I elected to go back to the Oasis to see if we can stimulate some granulation. Last saw his vascular interventional cardiologist Dr. Andree Elk at the beginning of August and he had a repeat procedure. Nevertheless I wonder how much blood flow he has down to this  area With regards to the left first toe he is seeing Dr. Earleen Newport next week. They are applying Bactroban to this area. 08/27/2018 ooOnce again he comes in with thick eschar and subcutaneous tissue over the top of the small probing hole. This does not appear to go down to bone but it still has roughly the same depth. I reapplied Oasis today ooOver the left great toe there appears to be more of the wound at the tip of his toe than there was last week. This has an eschar on the surface of it. Will change to Santyl. There is seeing podiatry this afternoon 09/03/2018 ooHe comes in today with the area on the transmetatarsal's site with a fair amount of callus, nonviable tissue over the circumference but it was not closed. I removed all of this as well as some subcutaneous debris and reapplied Oasis. There is no exposed bone ooThe area over the tip of the left great toe started off as a nodule of uncertain etiology. He has been followed with podiatry. They have been removing part of the medial nail bed. He has nonviable tissue over the wound he been using Santyl in this area 09/10/18 ooUnfortunately comes in with neither wound area looking improved. The transmetatarsal amputation site once Again has nonviable debris over the surface requiring debridement. Unfortunately  underneath this there is nothing that looks viable and this once again goes right down to bone. There is no purulent drainage and no erythema. ooAlso the surgical wound from podiatry on the left first toe has an ischemic-looking eschar over the surface of the tip of the toe I have elected not to attempt candidly debride this ooHis wife as arranged for him to have follow-up noninvasive studies in [place] under the care of Dr. Andree Elk clinic. The question is he has known severe PAD. They had recently seen him in August. He has had revascularizations in both legs within the last 4 or 5 months. He is not complaining of pain and I cannot really get a history of claudication. He has not systemically unwell 09/20/2018 the patient has been to [facility] and been revascularized by Dr. Andree Elk earlier this week. Apparently he was able to open up the anterior tibial artery although I have not actually seen his formal report. He is going for an attempt to revascularize on the left on Monday. He is apparently working with an investigational stent for lower extremity arteries below the knee and he has talked to the patient about placing that on Monday if possible. The patient has been using silver alginate on the transmetatarsal amputation site and Santyl on the left 09/30/2018; patient had his revascularization on the left this apparently included a standard approach as well as a more distal arterial catheterization although I do not have any information on this from Dr. Andree Elk. In fact I do not even see the initial revascularization that he had on the right. I have included the arterial history from Dr. Andree Elk last note however below; ASSESSMENT/PLAN: 1. Hx of Critical limb ischemia bilateral lower extremities, PAD: -s/p transmetatarsal amputation of the RLE. -s/p right ATA percutaneous revascularization procedure x2, most recently 11/20/2016 s/p PTA of right AT 100% to less than 20% with a 2.5 x 200 balloon. -On  original angiogram 05/15/2016, he had a significant 70-95% left popliteal artery stenosis with AT and PT artery occlusions and one vessel runoff via the peroneal artery. -03/21/2018 s/p PTA of 90% left popliteal artery to <10% with a 5x20 cutting balloon, PTA  of 90% left peroneal to <20% with a 3x20 balloon, PTA of 100% left AT to <20% with a 2.5x220 balloon. 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 Will plan on the LLE soon after. Risks/benefits of procedure have been discussed and patient has elected to proceed. We have been using endoform to the right TMA amputation site wound and Santyl to the left great toe 10/07/2018; the area on the tip of his left great toe looked better we have been using Santyl here. We continue to have a very difficult probing hole on the right TMA amputation site we have been using endoform. I went on to use his fifth Oasis today 10/14/2018; the tip of the left great toe continues to look better. We have been using Santyl here the surface however is healthy and I think we can change to an alginate. The right TMA has not changed. Once again he has no superficial opening there is callus and thick subcutaneous tissue over the orifice once you remove this there is the probing area that we have been dealing with without too much change. There is no palpable bone I have been placing Oasis here and put Oasis #6 in this today after a more vigorous debridement 10/21/18; the left great toe still has necrotic surface requiring debridement. The right TMA site hasn't changed in view of the thick callus over the wound bed. With removal of this there is still the opening however this does not appear to have the same depth. Again there is no palpable bone. Oasis was replaced 10/28/2018; patient comes in with both wounds looking worse. The  area over the first toe tip is now down to bone. The area over the TMA site is deeper and down to bone clearly with a increase in overall wound area. Equally concerning on the right TMA is the complete absence of a pulse this week which is a change. We are not even able to Doppler this. On the right he has a noncompressible ABI greater than 1.4 11/04/2018. Both wounds look somewhat worse. X-rays showed no osteomyelitis of the right foot but on the left there was underlying osteomyelitis in the left great toe distal phalanx. I been on the phone to Dr. Andree Elk surface at [facility] in [place] and they are arranging for another angiogram on the right on Monday. I have him on doxycycline for the osteomyelitis in the left great toe for now. Infectious disease may be necessary 1/17; 2-week hiatus. Patient was admitted to hospital at [place]. My understanding is he underwent an angioplasty of the right anterior tibial artery and had stents placed in the left anterior artery and the left tibial peroneal trunk. This was done by Dr. Andree Elk of interventional radiology. There is no major change in either 1 of the wounds. They have been using Aquacel Ag. As far as they are aware no imaging studies were done of the foot which is indeed unfortunate. I had him on doxycycline for 2 weeks since we identified the osteomyelitis in the left great toe by plain x-ray. I have renewed that again today. I am still suspicious about osteomyelitis in the amputation site and would consider doing another MRI to compare with the one done in September. The idea of hyperbaric oxygen certainly comes up for discussion 1/24; no major change in either wound area. I have him  on doxycycline for osteomyelitis at the tip of the left great toe. As noted he has been previously and recently revascularized by Dr. Andree Elk at [place]. He is tolerating the doxycycline well. For some reason we do not have an infectious disease consult yet. Culture of drainage  from the right foot site last week was negative 1/31; MRI of the right foot did not show osteomyelitis of the right ankle and foot. Notable for a skin ulceration overlying the second metatarsal stump with generalizing soft tissue edema of the ankle and foot consistent with cellulitis. Noted to have a partial-thickness tear of the Achilles tendon 7.5 cm proximal to the insertion Nothing really new in terms of symptoms. Patient's area on the tip of the left great toe is just about closed although he still has the probing area on the metatarsal amputation site. Appointment with Dr. Linus Salmons of infectious disease next week 2/7; Dr. Novella Olive did not feel that any further antibiotics were necessary he would follow-up in 2 months. The left great toe appears to be closed still some surface callus that I gently looked under high did not see anything open or anything that was threatening to be open. He still has the open area on the mid part of his TMA site using endoform 2/14; left great toe is closed and he is completing his doxycycline as of last Sunday. He is not on any antibiotics. Unfortunately out of the right foot his wife noticed some subdermal hemorrhage this week. He had been walking 2 miles I had given him permission to do so this is not really a plantar wound. Using endoform to this wound but I changed to silver alginate this week 2/21; left great toe remains closed. Culture last week grew Streptococcus angiosis which I am not really familiar with however it is penicillin sensitive and I am going to put him on Augmentin. Not much change in the wound on the right foot the deep area is still probing precariously close to bone and the wound on the margin of the TMA is larger. 2/28; left great toe remains closed. He is completing the Augmentin I gave him last week. 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 This is being shown to Dr. Andree Elk at [place] to see if there is  anything else that can be done here. He has been using silver alginate strips on the right 3/6; left great toe remains closed. He sees Dr. Andree Elk on Monday. The area on the plantar aspect of the right foot has the same small orifice with thick callused tissue around this. However this time with removal of the callus tissue the wound is open all the way along the incision line to the end medially. We have been using silver alginate. 3/13; left toe remains closed although the area is callused. He sees Dr. Jacqualyn Posey of podiatry next week. The area on the plantar right foot looked a lot better this week. Culture I did of this was negative we use silver alginate. He is going next week for an attempt at revascularization by Dr. Andree Elk 3/23; left toe remains closed although the area is callused. He will see Dr. Jacqualyn Posey in follow-up. The area on the right plantar foot continues to look surprisingly better over the last 3 visits. We have been using silver alginate. The revascularization he was supposed to have by Dr. Andree Elk at [facility] in [place] has been canceled [facility] procedure] 4/6; the right foot remains closed albeit callused. Podiatry  canceled the appointment with regards to the left great toe. He has not seen Dr. Andree Elk at [facility] but thinks that Dr. Andree Elk has "done all he can do". He would be a candidate for the hemostaemix trial Readmission 07/29/2019 Mr. Stann Mainland is a man we know well from at least 3 previous stays in this clinic. He is a type II diabetic with severe PAD followed by Dr. Andree Elk at [facility] in [place]. 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 So far everything in both of these areas has remained closed. About 2 weeks ago he went to see his podiatrist at [facility] Dr. Babs Bertin. He had a thick callus on the left fifth metatarsal head that was shaved. He  has developed an open wound in this area. They have been offloading this in his diabetic shoes. The patient has not had any more revascularizations by Dr. Andree Elk since the last time he was here. ABI in our clinic at the posterior tibial on the left was 1.06 10/6; no real change in the area on the plantar met head. Small wound with 2 mm of depth. He has thick skin probably from pressure around the wound. We have been using silver alginate 10/13; small wound in the left fifth plantar met head. Arrives today with undermining laterally and purulent drainage. Our intake nurse cultured this. His wife stated they noticed a change in color over the last day or 2. He is not systemically unwell 10/19; small wound on the fifth plantar metatarsal head. Culture I did last week showed Staphylococcus lugdunensis. Although this could be a skin contaminant the possibility of a skin and soft tissue infection was there. I did give him empiric doxycycline which should have covered this. We are using silver alginate to the wound. We put him in a total contact cast today. 10/22; small wound on the fifth plantar metatarsal head. He has completed antibiotics. He also has severe PAD which worries me about just about any wound on this man's foot 10/29; small superficial area on the fifth plantar metatarsal head. Measuring slightly smaller. He is not currently on any antibiotics. I been using a total contact cast in this man with very severe PAD but he seems to be tolerating this well 11/5; left plantar fifth metatarsal head. Silver alginate being used under a total contact cast 11/12; wound not much different than last week. Using a #15 scalpel debridement around the wound. Still silver collagen under a total contact cast. He has severe PAD and that may be playing a role in this 11/19; disappointing that the wound is not really changed that much. I think it is come down in overall surface area because originally this was on  the lateral part of the fifth metatarsal head however recently it is not really changed. Most of this is filled in but it will not epithelialized there is surface debris on this which may be mostly related to ischemia. They have an appointment with Dr. Andree Elk on 12/9 09/24/2019 on evaluation today patient appears to be doing somewhat better with regard to the wound on the left fifth metatarsal head. Fortunately there does not appear to be any signs of active infection at this time. No fevers, chills, nausea, vomiting, or diarrhea. The wound does not appear to be completely closed and I do feel like the Hydrofera  Blue is helping to some degree although I feel like the cast as well as keeping things from breaking down or getting any larger at least. As far as his wound overview it shows that the overall size of the wound is slightly smaller but really maintaining within the realm of about the same. He had no troubles with the cast which is good news. 12/1; left fifth metatarsal head. Perhaps somewhat more vibrant. We have been using Hydrofera Blue. He sees Dr. Andree Elk at [place] 1 week tomorrow. He be back next week and I hope to have a better idea which way the wound is going however I will not cast him next week in case Dr. Andree Elk wants to reevaluate things in his foot. The left leg is the leg with the stent in place and I wonder whether these are going to have to be reevaluated. 12/8; left fifth metatarsal head. Quite a bit better this week. Only a small open area remains. He sees Dr. Andree Elk at [place] tomorrow. Dr. Andree Elk is previously done his vascular interventions. He has 2 stents in the left leg as I remember things. I therefore will not put him in a total contact cast today. He will be using a forefoot off loader. We will use silver collagen on the wound. We will see him again next week 12/15; left fifth metatarsal head. He saw Dr. Andree Elk and is scheduled for an angiogram on Thursday. Unfortunately although  his wound was a lot better last week it is really deteriorated this week. Small punched-out hole with depth and overhanging tissue. We have been using Hydrofera Blue 12/22; patient had an 80% stenosis proximal to the stent I believe in the below-knee popliteal artery. This was opened by Dr. Andree Elk. According the patient the stent itself was satisfactory. I have not been able to review this note. 12/29; patient arrives today with the wound about the same size some undermining medially. We have been using Hydrofera Blue under a total contact cast and that is what we will do again today 11/04/2019. Slightly smaller in orifice but about 4 mm undermining almost circumferentially. We have been using Hydrofera Blue. Apparently the Hydrofera Blue did not stay on the wound after we removed the cast. Some minor looking cast irritation on the right lateral 1/12; unfortunately things did not go well this week. Arrives in clinic today with a deeper wound with undermining more concerning a area of pus. Specimen was obtained for culture. We are not going to be able to put him in a cast today. 1/19; purulent material last week which I cultured showed Enterobacter cloacae. He was on ampicillin previously prescribed by his primary doctor which should not have covered this however mysteriously the wound looks somewhat better. There is less depth less erythema certainly no drainage. He will need a course of ciprofloxacin which I will provide today. 1/26; he has completed the ciprofloxacin. I don't think he needs any additional antibiotics. The areas on the left fifth plantar met head. We've been using silver alginate 2/2; comes in today with 0.4 cm of circumferential undermining around a small wound in this area. We have been using silver alginate with a forefoot offloading boot 2/9; again the wound appears larger nonviable surface and with 0.4 cm roughly of circumferential undermining. We have been using silver alginate  with a forefoot offloading boot. The wound does not look infected however his wife is quick to point out about swelling on the dorsal foot. 2/16; not much change.  Comes in with a small wound with a nonviable necrotic surface. Again marked undermining that I had totally removed last week. He had a arterial Doppler last week in Dr. Andree Elk office at [facility]. They have not heard these results. They are somewhat frustrated. 2/23; if anything this is worse. Undermining increased depth. Nonviable surface that looks pale. According his wife is TBI on the right was 0.22 although I have not verified this. He is going for an angiogram with Dr. Andree Elk next Monday. We are using polymen and offloading in a forefoot offloading boot. 3/2; the patient was revascularized yesterday by [facility]. He had successful PTA of the left peroneal 60% to less than 20% and successful PTA of the left ATA 100% to less than 20%. He now has a pulse in the left foot Now that he has some additional blood flow there is not a lot of options here. On one hand we want activity to help keep blood flow augmented on the other hand pressure relief is going to be paramount if we have any chance to get this to close. After some discussion with his wife and patient I went ahead and put him in a total contact cast. 3/9; he arrives in clinic today with some debris over the wound surface. We use silver collagen under a total contact cast after his revascularization by Dr. Andree Elk 3/16; wound about the same depth at 0.5 cm. Surface area perhaps slightly less but it is the depth the really is important. We have been using silver collagen 3/23; not much change here. There is still undermining medially the depth is about the same tissue looks somewhat better. We switched to Iodoflex last week to try to get a better looking wound surface. He does have a small abrasion over the proximal interphalangeal joint dorsally. I this is so small  it is difficult even to tell whether it is open. Some abrasion between the fifth toe and the webspace as well 3/30; if anything the wound is deeper this week and larger. Very disappointing. Under illumination debris on the surface which I debrided gently with a #3 curette there is minimal bleeding. We had been using iodoflex under a total contact cast. I am going to take him out of the cast and change him to [place] today which his wife will change daily. 4/6; wound bed looks better. Still undermining medially. Some maceration and erythema around the wound. We have been using Santyl change daily. I took him out of a total contact cast last week 4/13; patient's culture from last time grew MSSA. The wound is larger not a very viable surface. Duplex ultrasound I did last week rule out DVT was negative. He still has a fair amount of pitting edema in the lower leg and dorsal foot but I am not really certain why. I started him on Keflex 500 every 6 for 10 days starting yesterday His wife tells me that Dr. Andree Elk office in [place] has been in contact they want to look at restudying I believe to see if there is further procedures planned. I told his wife he still has a feeble dorsalis pedis pulse certainly not as robust as after the last procedure although this could be partially because of edema 4/20; he has completed his antibiotics 2 weeks worth of Keflex for MSSA. He has necrotic material over a large portion of this wound. The swelling in his leg is come down presumably resolved with treatment of the infection. He  will have a arterial ultrasound by Dr. Andree Elk on Friday and apparently they see him later that day. We are using Bactroban and silver alginate 4/27 patient had his arterial Dopplers ABIs and TBI's done by Dr. Andree Elk. He was noted to have a moderate to significant stenosis involving the left proximal superficial artery consistent with a 50 to 69% diameter reduction. The left anterior tibial artery  including the dorsalis pedis was patent. There was no flow in the distal posterior tibial artery monophasic flow within the distal peroneal artery. The great toe index was 0.32 his absolute great toe pressure was 38 arterial flow was detected by PPG within all 5 digits. He is due to have angiography again on 5/13 Dr. Andree Elk put him back on 2 weeks worth of Keflex. The wound has made absolutely no progress. Interestingly it is more centered over the lateral part of his foot started more on the plantar aspect. 5/4; he is going for his procedure a week Thursday. We will see him back next week he is using Santyl with calcium alginate. His MRI is not toe the 17th. 5/11; patient sees Dr. Andree Elk for angiography on Thursday of this week. His MRI is on 5/17. If his MRI is positive for osteomyelitis he will need IV antibiotics and hyperbarics he is prepared for this eventuality. If it is not positive we are going to probably have to try an advanced treatment option 5/18; patient was revascularized by Dr. Andree Elk and he was able to get inline flow through the anterior tibial artery down into the patient's foot. Unfortunately his MRI showed osteomyelitis of the fifth metatarsal head and base of the fifth proximal phalanx with possible septic arthritis versus a reactive fifth MTP joint effusion. After discussion with the patient we talked with Dr. Andree Elk at [place] who is graciously going to electively admit the patient for IV antibiotics orthopedic consultation. We will standby and await discharge consideration of hyperbaric oxygen. Objective Constitutional Sitting or standing Blood Pressure is within target range for patient.. Pulse regular and within target range for patient.Marland Kitchen Respirations regular, non-labored and within target range.. Temperature is normal and within the target range for the patient.Marland Kitchen Appears in no distress. Vitals Time Taken: 12:54 PM, Height: 69 in, Source: Stated, Weight: 210 lbs, Source:  Stated, BMI: 31, Temperature: 98.5 F, Pulse: 76 bpm, Respiratory Rate: 18 breaths/min, Blood Pressure: 108/63 mmHg, Capillary Blood Glucose: 130 mg/dl. General Notes: glucose per pt report this am Cardiovascular As usual post vascular intervention his dorsalis pedis is palpable. General Notes: Wound exam; there is less surface debris and more vibrant looking tissue. ooThere is a blister on the medial first metatarsal head this that itself is worrisome small blackened area at the tip of the right first toe perhaps an embolic area Integumentary (Hair, Skin) Wound #5 status is Open. Original cause of wound was Gradually Appeared. The wound is located on the Left Metatarsal head fifth. The wound measures 2.2cm length x 1.8cm width x 0.4cm depth; 3.11cm^2 area and 1.244cm^3 volume. There is tendon and Fat Layer (Subcutaneous Tissue) Exposed exposed. There is no tunneling or undermining noted. There is a medium amount of purulent drainage noted. The wound margin is distinct with the outline attached to the wound base. There is no granulation within the wound bed. There is a large (67-100%) amount of necrotic tissue within the wound bed including Adherent Slough. Assessment Active Problems ICD-10 Type 2 diabetes mellitus with foot ulcer Type 2 diabetes mellitus with diabetic peripheral angiopathy  without gangrene Non-pressure chronic ulcer of other part of left foot with other specified severity Other acute osteomyelitis, left ankle and foot Plan Follow-up Appointments: Return Appointment in 1 week. Dressing Change Frequency: Wound #5 Left Metatarsal head fifth: Change dressing every day. Wound Cleansing: Wound #5 Left Metatarsal head fifth: May shower and wash wound with soap and water. Primary Wound Dressing: Calcium Alginate with Silver - to blister on left 1 st met head Wound #5 Left Metatarsal head fifth: Calcium Alginate - over santyl Santyl Ointment Other: - pad right great toe  for protection Secondary Dressing: Foam - secure with tape Off-Loading: Open toe surgical shoe to: - with felt. apply to left foot. Additional Orders / Instructions: Other: - closely monitor redness. 1. Apply silver alginate to the area on the left left fifth met head. 2. Border foam to the medial first metatarsal head 3. With gracious support of Dr. Andree Elk he is being admitted to [facility] for IV antibiotics orthopedic consultation. 4. Surprisingly Dr. Andree Elk feels he might be able to heal the ray amputation Electronic Signature(s) Signed: 03/18/2020 12:52:55 PM By: [name] MD Entered By: [name] on 03/16/2020 14:24:43 -------------------------------------------------------------------------------- SuperBill Details Patient Name: Date of Service: Ileana Roup, Carola Frost YE 03/16/2020 Medical Record Number: [id number] Patient Account Number: [id number] Date of Birth/Sex: Treating RN: 22-Dec-1945 (73 y.o. [name] Primary Care Provider: [name] Other Clinician: Referring Provider: Treating Provider/Extender: Leonette Nutting in Treatment: 33 Diagnosis Coding ICD-10 Codes Code Description E11.621 Type 2 diabetes mellitus with foot ulcer E11.51 Type 2 diabetes mellitus with diabetic peripheral angiopathy without gangrene L97.528 Non-pressure chronic ulcer of other part of left foot with other specified severity Facility Procedures The patient participates with Medicare or their insurance follows the Medicare Facility Guidelines: CPT4 Code Description Modifier Quantity [id number] Spangle VISIT-LEV 3 EST PT 1 Physician Procedures : CPT4 Code Description Modifier [id number] - WC PHYS LEVEL 3 - EST PT ICD-10 Diagnosis Description E11.621 Type 2 diabetes mellitus with foot ulcer E11.51 Type 2 diabetes mellitus with diabetic peripheral angiopathy without gangrene L97.528  Non-pressure chronic ulcer of other part of left foot with other  specified severity Quantity: 1 Electronic Signature(s) Signed: 03/18/2020 12:52:55 PM By: [name] MD Entered By: [name] on 03/16/2020 14:25:03

## 2020-03-23 ENCOUNTER — Ambulatory Visit (HOSPITAL_COMMUNITY): Payer: Medicare Other

## 2020-03-23 ENCOUNTER — Encounter (HOSPITAL_BASED_OUTPATIENT_CLINIC_OR_DEPARTMENT_OTHER): Payer: Medicare Other | Admitting: Internal Medicine

## 2020-03-30 ENCOUNTER — Encounter (HOSPITAL_BASED_OUTPATIENT_CLINIC_OR_DEPARTMENT_OTHER): Payer: Medicare Other | Admitting: Internal Medicine

## 2020-04-06 ENCOUNTER — Encounter (HOSPITAL_BASED_OUTPATIENT_CLINIC_OR_DEPARTMENT_OTHER): Payer: Medicare Other | Admitting: Internal Medicine

## 2020-04-13 ENCOUNTER — Encounter (HOSPITAL_BASED_OUTPATIENT_CLINIC_OR_DEPARTMENT_OTHER): Payer: Medicare Other | Admitting: Internal Medicine

## 2020-04-20 ENCOUNTER — Encounter (HOSPITAL_BASED_OUTPATIENT_CLINIC_OR_DEPARTMENT_OTHER): Payer: Medicare Other | Admitting: Internal Medicine

## 2020-04-27 ENCOUNTER — Encounter (HOSPITAL_BASED_OUTPATIENT_CLINIC_OR_DEPARTMENT_OTHER): Payer: Medicare Other | Admitting: Internal Medicine

## 2020-12-04 IMAGING — MR MR FOOT*R* WO/W CM
4 of 9 series · 18 of 40 positions shown · IV contrast (gadavist)
Comparison: 07/08/2018

CLINICAL DATA: Hx of osteomyelitis, prior partial amputation of
forefoot, follow up exam, prior in PACS, 9mL gadavist given

EXAM:
MRI OF THE RIGHT FOREFOOT WITHOUT AND WITH CONTRAST
TECHNIQUE: Multiplanar, multisequence MR imaging of the right foot was
performed before and after the administration of intravenous
contrast.
CONTRAST:  9 mL Gadavist

[Series 2: T1 · coronal · 3.0mm · 0.25mm/px · 5 of 51 slices shown (1 of 2)]
[im 1/51]
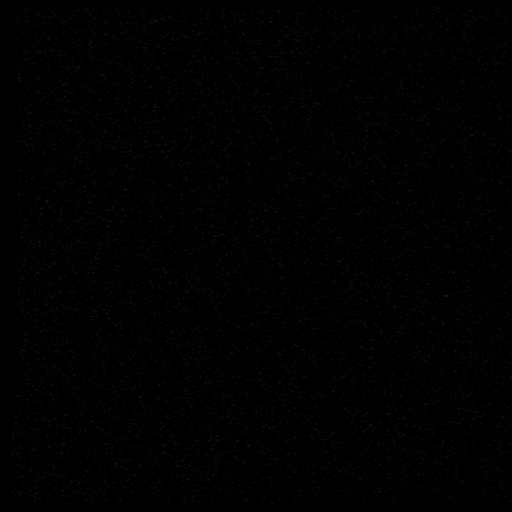
[im 13/51]
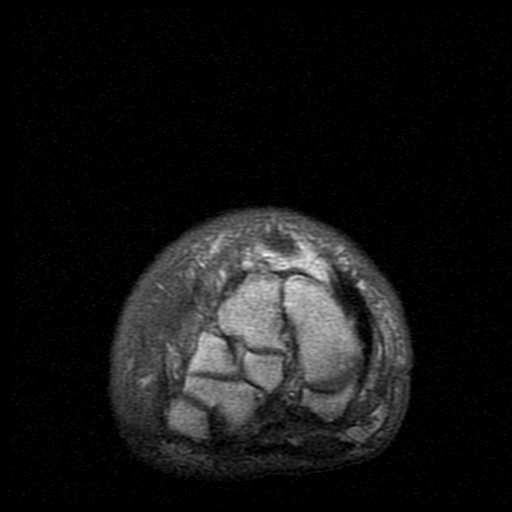
[im 26/51]
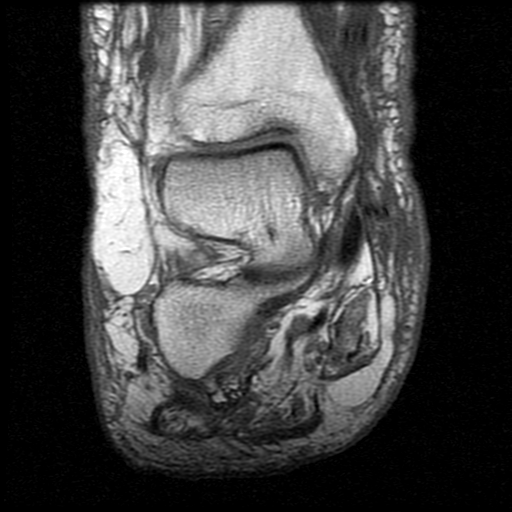
[im 38/51]
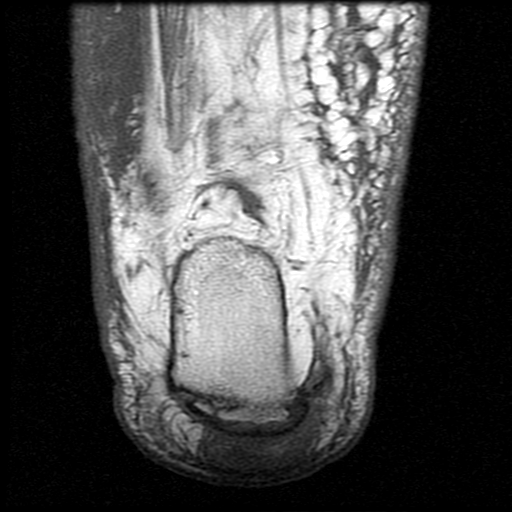
[im 51/51]
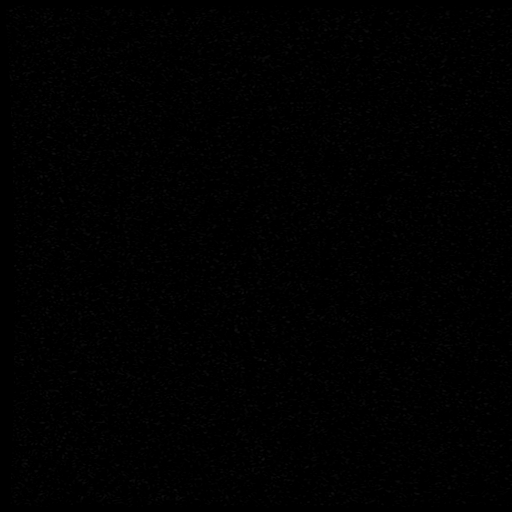

[Series 5: T1 · axial · 3.0mm · 0.35mm/px · z∈[-88,+38]mm · 4 of 33 slices shown (2 of 2)]
[im 1/33]
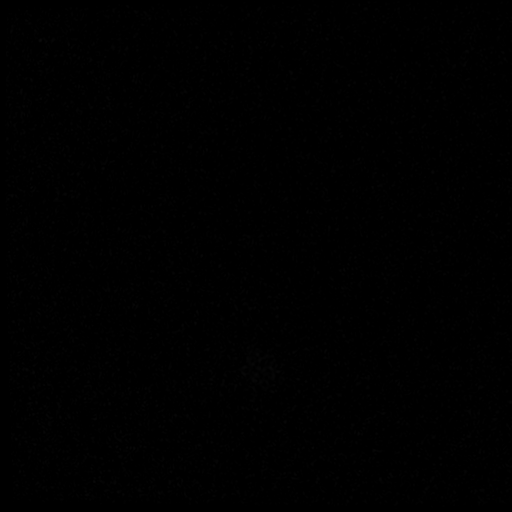
[im 11/33]
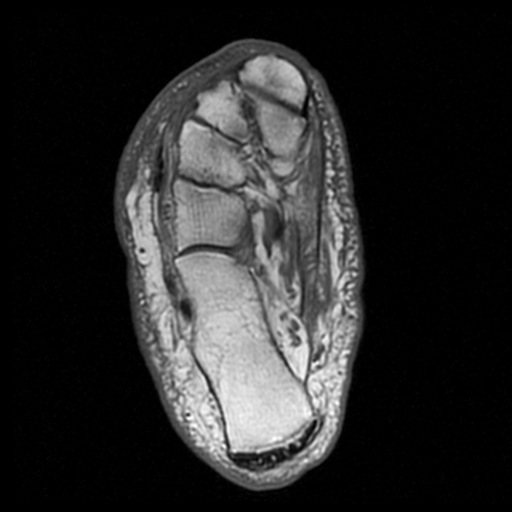
[im 22/33]
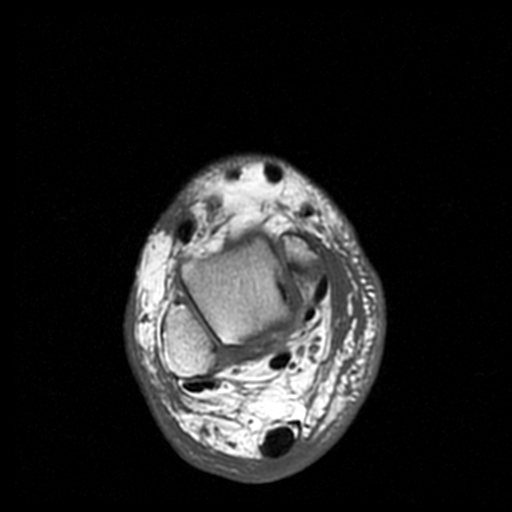
[im 33/33]
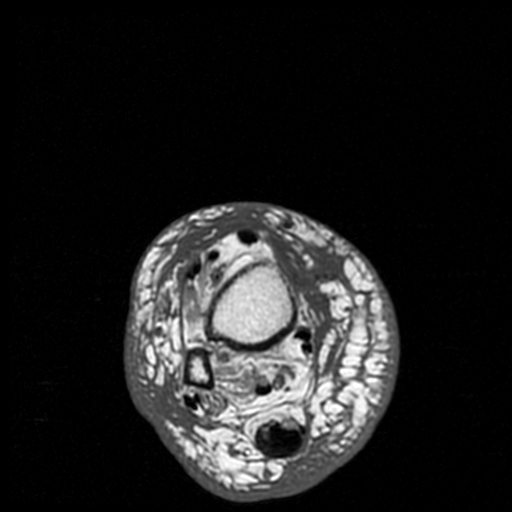

[Series 7: T1 fat-sat · coronal · non-contrast · 3.0mm · 0.25mm/px · 6 of 51 slices shown]
[im 1/51]
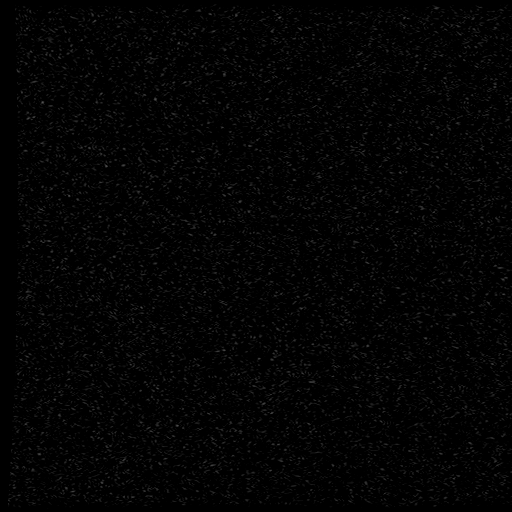
[im 11/51]
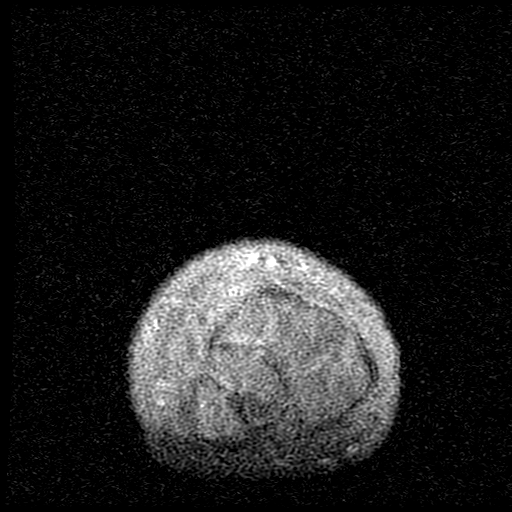
[im 21/51]
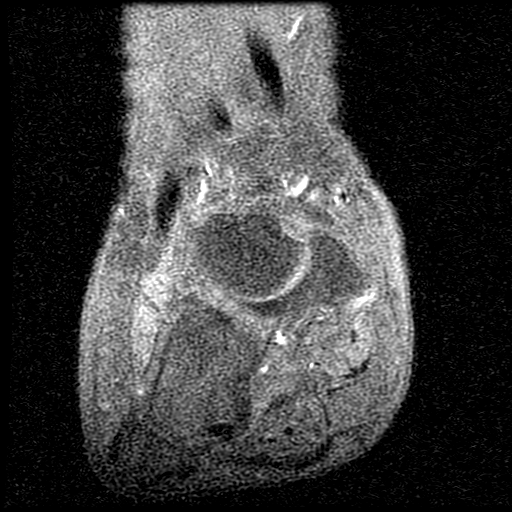
[im 31/51]
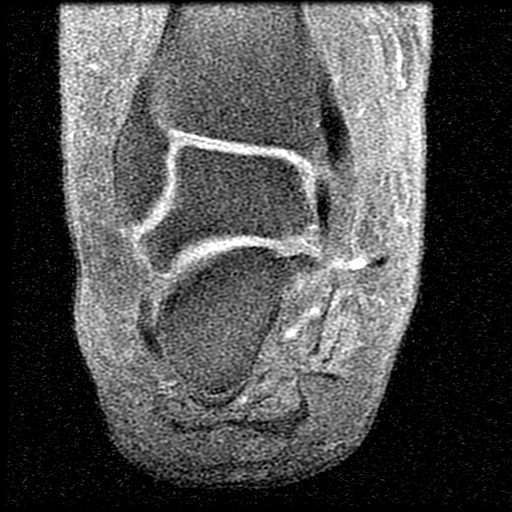
[im 41/51]
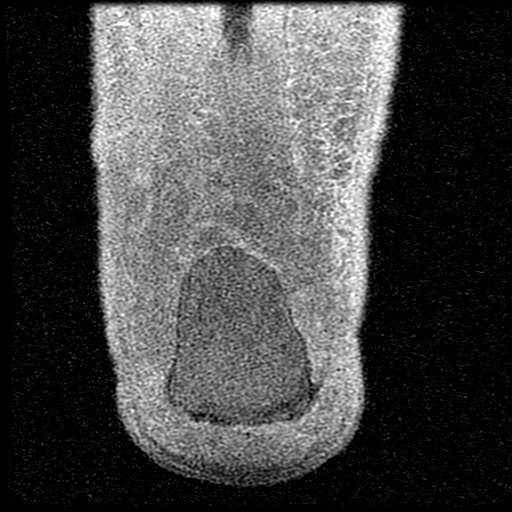
[im 51/51]
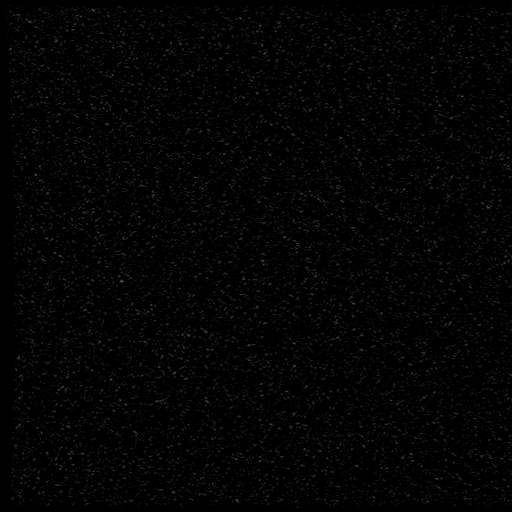

[Series 8: T1 fat-sat post-contrast · coronal · 3.0mm · 0.25mm/px · 3 of 51 slices shown]
[im 11/51]
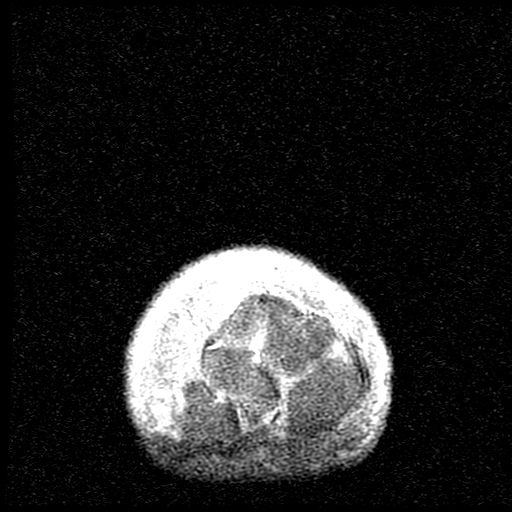
[im 31/51]
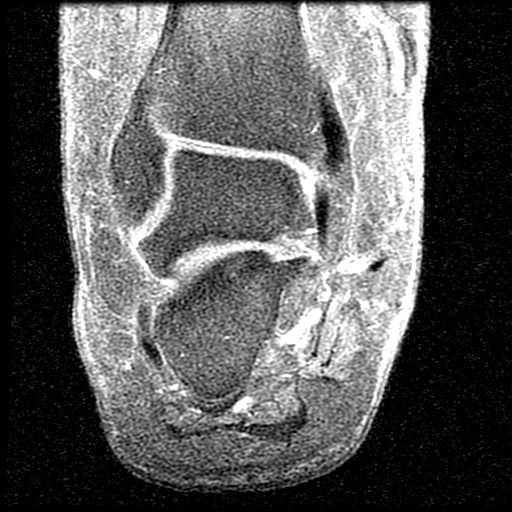
[im 51/51]
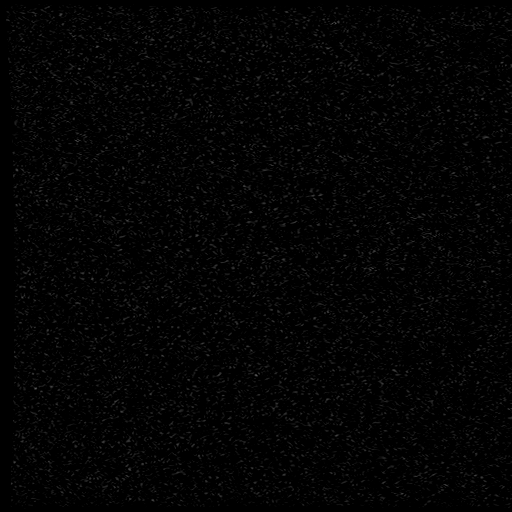

[18 of 40 positions shown; findings below may reference images not displayed]

FINDINGS: TENDONS

Peroneal: Peroneal longus tendon intact. Peroneal brevis intact.

Posteromedial: Posterior tibial tendon intact. Flexor hallucis
longus tendon intact. Flexor digitorum longus tendon intact.

Anterior: Tibialis anterior tendon intact. Extensor hallucis longus
tendon intact Extensor digitorum longus tendon intact.

Achilles: Mild-moderate tendinosis of the Achilles tendon with a
small partial-thickness tear 7.5 cm proximal to the insertion.

Plantar Fascia: Intact. Enthesopathic changes at the plantar fascia
insertion.

LIGAMENTS

Lateral: Severely attenuated anterior talofibular ligament
consistent with prior tear. Calcaneofibular ligament intact.
Posterior talofibular ligament intact. Anterior and posterior
tibiofibular ligaments intact.

Medial: Deltoid ligament intact. Spring ligament intact.

CARTILAGE

Ankle Joint: No joint effusion. Normal ankle mortise. No chondral
defect.

Subtalar Joints/Sinus Tarsi: Normal subtalar joints. No subtalar
joint effusion. Normal sinus tarsi.

Bones: Prior transmetatarsal amputation with the bases of the
metatarsal remaining. There is no bone marrow edema, periosteal
reaction or bone destruction. No acute fracture or dislocation.

Soft Tissue: Generalized soft tissue edema of the ankle and foot.
Skin ulceration of the foot overlying the second metatarsal stump.
IMPRESSION: 1. No osteomyelitis of right ankle and foot. Skin ulceration
overlying the second metatarsal stump with generalized soft tissue
edema of the ankle and foot consistent with cellulitis.
2. Mild-moderate tendinosis of the Achilles tendon with a small
partial-thickness tear 7.5 cm proximal to the insertion.

## 2024-05-26 ENCOUNTER — Ambulatory Visit: Admitting: Podiatry

## 2024-05-26 ENCOUNTER — Ambulatory Visit: Payer: Self-pay | Admitting: Podiatry

## 2024-05-26 ENCOUNTER — Encounter: Payer: Self-pay | Admitting: Podiatry

## 2024-05-26 DIAGNOSIS — B351 Tinea unguium: Secondary | ICD-10-CM | POA: Diagnosis not present

## 2024-05-26 DIAGNOSIS — M79672 Pain in left foot: Secondary | ICD-10-CM

## 2024-05-26 DIAGNOSIS — I70209 Unspecified atherosclerosis of native arteries of extremities, unspecified extremity: Secondary | ICD-10-CM | POA: Diagnosis not present

## 2024-05-26 DIAGNOSIS — E1151 Type 2 diabetes mellitus with diabetic peripheral angiopathy without gangrene: Secondary | ICD-10-CM | POA: Diagnosis not present

## 2024-05-26 NOTE — Progress Notes (Signed)
 Patient presents for evaluation and treatment of tenderness and some redness around nails feet.  Tenderness around toes with walking and wearing shoes.  Physical exam:  General appearance: Alert, pleasant, and in no acute distress.  Vascular: Pedal pulses: DP 2/4 B/L, PT 0/4 B/L.  Mild to moderate edema lower legs bilaterally  Neurological:    Dermatologic:  Nails thickened, disfigured, discolored 1-5 left with subungual debris.  Redness and hypertrophic nail folds along nail folds bilaterally but no signs of drainage or infection.   Musculoskeletal: Status post BKA amp right  Diagnosis: 1. 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 Pain toes 1 through 5 left 3.  Diabetes mellitus type 2 with PVD  Plan: Debrided onychomycotic nails 1 through 5 left Return 3 months [facility]

## 2024-07-17 ENCOUNTER — Telehealth: Payer: Self-pay | Admitting: Podiatry

## 2024-07-17 ENCOUNTER — Ambulatory Visit: Admitting: Podiatry

## 2024-07-17 ENCOUNTER — Encounter: Payer: Self-pay | Admitting: Podiatry

## 2024-07-17 DIAGNOSIS — L600 Ingrowing nail: Secondary | ICD-10-CM

## 2024-07-17 NOTE — Telephone Encounter (Signed)
 Wife called and is concerned. He snagged toe on a compression sock 2 days ago. Trying to get him added on for today at 2:45 pm if not you had a cancellation for tomorrow at 1pm and currently have him added there. Please advise if he needs to be worked in today or can wait until tomorrow. Thank you.   I have also forward an email with pictures attached.

## 2024-07-17 NOTE — Progress Notes (Signed)
 Patient was unexplainably nail on the hallux left.  He snagged the nail on his stocking and pulled it back and it bled.  He was concerned about that given his history of a BKA on the right and PAD on the left.  No fever chills nausea or vomiting.SABRA   Physical exam:  General appearance: Pleasant, and in no acute distress. AOx3.  Vascular: Pedal pulses: Pedal pulses nonpalpable left.  Moderate edema lower legs bilaterally. Capillary fill time immediate left.  Neurological: Grossly intact left  Dermatologic:   Some dried blood the leading medial nail edge on the hallux left.  No signs of infection.  Skin is a little irritated in the area but there is no active drainage.  Nail is incurvated to this area and loose to the nailbed.  Skin normal temperature bilaterally.  Skin normal color, tone, and texture bilaterally.   Musculoskeletal: Status post BKA  right   Diagnosis: 1.  Ingrown nail hallux left medial border  Plan: -Established office visit for evaluation and management.  Level 3. - Discussed the nail where the nail was partially torn and caused some bleeding and irritation.  There it appears to be healing well with no signs of infection.  Recommended soaking the foot twice daily warm salt water for 15 minutes twice daily and apply antibiotic ointment and a light dressing.  Today we debrided the hallux nail edges back to keep from rubbing the area.   Return for regularly scheduled [facility]

## 2024-07-17 NOTE — Telephone Encounter (Signed)
 Patient sch'ed for appt today @ 4pm

## 2024-07-18 ENCOUNTER — Ambulatory Visit: Admitting: Podiatry

## 2024-09-01 ENCOUNTER — Ambulatory Visit: Admitting: Podiatry

## 2024-09-02 ENCOUNTER — Ambulatory Visit: Admitting: Podiatry

## 2024-09-02 DIAGNOSIS — M79672 Pain in left foot: Secondary | ICD-10-CM

## 2024-09-02 DIAGNOSIS — B351 Tinea unguium: Secondary | ICD-10-CM | POA: Diagnosis not present

## 2024-09-02 NOTE — Progress Notes (Signed)
 Patient presents for evaluation and treatment of tenderness and some redness around nails feet.  Tenderness around toes with walking and wearing shoes.  Physical exam:  General appearance: Alert, pleasant, and in no acute distress.  Vascular: Pedal pulses: DP 2/4 B/L, PT 0/4 B/L.  Mild to moderate edema lower legs bilaterally  Neurological:    Dermatologic:  Nails thickened, disfigured, discolored 1-5 left with subungual debris.  Redness and hypertrophic nail folds along nail folds bilaterally but no signs of drainage or infection.   Musculoskeletal: Status post BKA amp right  Diagnosis: 1. 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 Pain toes 1 through 5 left 3.  Diabetes mellitus type 2 with PVD  Plan: Debrided onychomycotic nails 1 through 5 left Return 3 months [facility]

## 2024-12-04 ENCOUNTER — Ambulatory Visit: Admitting: Podiatry

## 2024-12-04 ENCOUNTER — Encounter: Payer: Self-pay | Admitting: Podiatry

## 2024-12-04 DIAGNOSIS — B351 Tinea unguium: Secondary | ICD-10-CM

## 2024-12-04 NOTE — Progress Notes (Signed)
 Patient presents for evaluation and treatment of tenderness and some redness around nails feet.  Tenderness around toes with walking and wearing shoes.  Physical exam:  General appearance: Alert, pleasant, and in no acute distress.  Vascular: Pedal pulses: DP 2/4 left PT 0/4 left.  Mild to moderate edema lower legs bilaterally.  Capillary refill time immediate bilaterally  Neurologic:  Dermatologic:  Nails thickened, disfigured, discolored 1-5 left with subungual debris.  Redness and hypertrophic nail folds along nail folds bilaterally but no signs of drainage or infection.  Musculoskeletal:  Status post BKA and right   Diagnosis: 1. 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 Pain toes 1 through 5 left  Plan: -Debrided onychomycotic nails 1 through 5 left.  Sharply debrided nails with nail clipper and reduced with a power bur.  Return 3 months [facility]

## 2024-12-05 ENCOUNTER — Other Ambulatory Visit: Payer: Self-pay | Admitting: Podiatry

## 2025-03-03 ENCOUNTER — Ambulatory Visit: Admitting: Podiatry
